# Patient Record
Sex: Male | Born: 1945
Health system: Southern US, Community
[De-identification: ages and names within clinical notes are randomized; demographics above are authoritative.]

## PROBLEM LIST (undated history)

## (undated) DIAGNOSIS — IMO0002 Reserved for concepts with insufficient information to code with codable children: Secondary | ICD-10-CM

## (undated) DIAGNOSIS — Z953 Presence of xenogenic heart valve: Secondary | ICD-10-CM

## (undated) DIAGNOSIS — I701 Atherosclerosis of renal artery: Secondary | ICD-10-CM

## (undated) DIAGNOSIS — I4891 Unspecified atrial fibrillation: Secondary | ICD-10-CM

## (undated) DIAGNOSIS — I219 Acute myocardial infarction, unspecified: Secondary | ICD-10-CM

## (undated) DIAGNOSIS — E119 Type 2 diabetes mellitus without complications: Secondary | ICD-10-CM

## (undated) DIAGNOSIS — I209 Angina pectoris, unspecified: Secondary | ICD-10-CM

## (undated) DIAGNOSIS — Z951 Presence of aortocoronary bypass graft: Secondary | ICD-10-CM

## (undated) DIAGNOSIS — R0600 Dyspnea, unspecified: Secondary | ICD-10-CM

## (undated) DIAGNOSIS — E039 Hypothyroidism, unspecified: Secondary | ICD-10-CM

## (undated) DIAGNOSIS — T8859XA Other complications of anesthesia, initial encounter: Secondary | ICD-10-CM

## (undated) DIAGNOSIS — I2510 Atherosclerotic heart disease of native coronary artery without angina pectoris: Principal | ICD-10-CM

## (undated) DIAGNOSIS — C140 Malignant neoplasm of pharynx, unspecified: Secondary | ICD-10-CM

## (undated) DIAGNOSIS — I509 Heart failure, unspecified: Secondary | ICD-10-CM

## (undated) DIAGNOSIS — Z950 Presence of cardiac pacemaker: Secondary | ICD-10-CM

## (undated) DIAGNOSIS — I10 Essential (primary) hypertension: Secondary | ICD-10-CM

## (undated) DIAGNOSIS — E669 Obesity, unspecified: Secondary | ICD-10-CM

## (undated) DIAGNOSIS — I9789 Other postprocedural complications and disorders of the circulatory system, not elsewhere classified: Secondary | ICD-10-CM

## (undated) DIAGNOSIS — Z95810 Presence of automatic (implantable) cardiac defibrillator: Secondary | ICD-10-CM

## (undated) DIAGNOSIS — E785 Hyperlipidemia, unspecified: Secondary | ICD-10-CM

## (undated) DIAGNOSIS — F419 Anxiety disorder, unspecified: Secondary | ICD-10-CM

## (undated) DIAGNOSIS — T4145XA Adverse effect of unspecified anesthetic, initial encounter: Secondary | ICD-10-CM

## (undated) DIAGNOSIS — I25810 Atherosclerosis of coronary artery bypass graft(s) without angina pectoris: Secondary | ICD-10-CM

## (undated) HISTORY — DX: Acute myocardial infarction, unspecified: I21.9

## (undated) HISTORY — PX: CORONARY ANGIOPLASTY WITH STENT PLACEMENT: SHX49

## (undated) HISTORY — PX: CARDIAC VALVE REPLACEMENT: SHX585

## (undated) HISTORY — DX: Malignant neoplasm of pharynx, unspecified: C14.0

## (undated) HISTORY — PX: CORONARY ANGIOPLASTY: SHX604

## (undated) HISTORY — DX: Presence of aortocoronary bypass graft: Z95.1

## (undated) HISTORY — DX: Atherosclerotic heart disease of native coronary artery without angina pectoris: I25.10

## (undated) HISTORY — DX: Hyperlipidemia, unspecified: E78.5

## (undated) HISTORY — DX: Other postprocedural complications and disorders of the circulatory system, not elsewhere classified: I97.89

## (undated) HISTORY — DX: Atherosclerosis of renal artery: I70.1

## (undated) HISTORY — DX: Essential (primary) hypertension: I10

## (undated) HISTORY — DX: Obesity, unspecified: E66.9

## (undated) HISTORY — DX: Unspecified atrial fibrillation: I48.91

## (undated) HISTORY — DX: Type 2 diabetes mellitus without complications: E11.9

## (undated) HISTORY — DX: Atherosclerosis of coronary artery bypass graft(s) without angina pectoris: I25.810

---

## 2005-10-02 HISTORY — PX: RENAL ARTERY STENT: SHX2321

## 2005-10-03 ENCOUNTER — Inpatient Hospital Stay (HOSPITAL_COMMUNITY): Admission: EM | Admit: 2005-10-03 | Discharge: 2005-10-07 | Payer: Self-pay | Admitting: Emergency Medicine

## 2005-10-09 ENCOUNTER — Inpatient Hospital Stay (HOSPITAL_COMMUNITY): Admission: RE | Admit: 2005-10-09 | Discharge: 2005-10-21 | Payer: Self-pay | Admitting: Cardiovascular Disease

## 2005-10-13 DIAGNOSIS — Z951 Presence of aortocoronary bypass graft: Secondary | ICD-10-CM

## 2005-10-13 HISTORY — DX: Presence of aortocoronary bypass graft: Z95.1

## 2005-10-13 HISTORY — PX: CORONARY ARTERY BYPASS GRAFT: SHX141

## 2005-10-15 DIAGNOSIS — I4891 Unspecified atrial fibrillation: Secondary | ICD-10-CM

## 2005-10-15 HISTORY — DX: Unspecified atrial fibrillation: I48.91

## 2005-10-19 HISTORY — PX: TRANSESOPHAGEAL ECHOCARDIOGRAM: SHX273

## 2005-11-13 ENCOUNTER — Encounter
Admission: RE | Admit: 2005-11-13 | Discharge: 2005-11-13 | Payer: Self-pay | Admitting: Thoracic Surgery (Cardiothoracic Vascular Surgery)

## 2007-10-09 HISTORY — PX: OTHER SURGICAL HISTORY: SHX169

## 2010-03-28 HISTORY — PX: OTHER SURGICAL HISTORY: SHX169

## 2013-01-22 DIAGNOSIS — E119 Type 2 diabetes mellitus without complications: Secondary | ICD-10-CM | POA: Diagnosis not present

## 2013-01-22 DIAGNOSIS — E785 Hyperlipidemia, unspecified: Secondary | ICD-10-CM | POA: Diagnosis not present

## 2013-01-22 DIAGNOSIS — I2510 Atherosclerotic heart disease of native coronary artery without angina pectoris: Secondary | ICD-10-CM | POA: Diagnosis not present

## 2013-01-22 DIAGNOSIS — I10 Essential (primary) hypertension: Secondary | ICD-10-CM | POA: Diagnosis not present

## 2013-01-22 DIAGNOSIS — Z79899 Other long term (current) drug therapy: Secondary | ICD-10-CM | POA: Diagnosis not present

## 2013-01-22 DIAGNOSIS — E782 Mixed hyperlipidemia: Secondary | ICD-10-CM | POA: Diagnosis not present

## 2013-01-31 DIAGNOSIS — Z6831 Body mass index (BMI) 31.0-31.9, adult: Secondary | ICD-10-CM | POA: Diagnosis not present

## 2013-01-31 DIAGNOSIS — Z23 Encounter for immunization: Secondary | ICD-10-CM | POA: Diagnosis not present

## 2013-01-31 DIAGNOSIS — IMO0001 Reserved for inherently not codable concepts without codable children: Secondary | ICD-10-CM | POA: Diagnosis not present

## 2013-01-31 DIAGNOSIS — Z125 Encounter for screening for malignant neoplasm of prostate: Secondary | ICD-10-CM | POA: Diagnosis not present

## 2013-02-19 DIAGNOSIS — IMO0001 Reserved for inherently not codable concepts without codable children: Secondary | ICD-10-CM | POA: Diagnosis not present

## 2013-02-25 DIAGNOSIS — E785 Hyperlipidemia, unspecified: Secondary | ICD-10-CM | POA: Diagnosis not present

## 2013-02-25 DIAGNOSIS — IMO0001 Reserved for inherently not codable concepts without codable children: Secondary | ICD-10-CM | POA: Diagnosis not present

## 2013-02-25 DIAGNOSIS — I10 Essential (primary) hypertension: Secondary | ICD-10-CM | POA: Diagnosis not present

## 2013-02-25 DIAGNOSIS — E669 Obesity, unspecified: Secondary | ICD-10-CM | POA: Diagnosis not present

## 2013-03-04 DIAGNOSIS — Z23 Encounter for immunization: Secondary | ICD-10-CM | POA: Diagnosis not present

## 2013-03-10 ENCOUNTER — Other Ambulatory Visit: Payer: Self-pay | Admitting: Cardiovascular Disease

## 2013-03-18 DIAGNOSIS — IMO0001 Reserved for inherently not codable concepts without codable children: Secondary | ICD-10-CM | POA: Diagnosis not present

## 2013-03-18 DIAGNOSIS — E785 Hyperlipidemia, unspecified: Secondary | ICD-10-CM | POA: Diagnosis not present

## 2013-03-18 DIAGNOSIS — I10 Essential (primary) hypertension: Secondary | ICD-10-CM | POA: Diagnosis not present

## 2013-06-24 DIAGNOSIS — H524 Presbyopia: Secondary | ICD-10-CM | POA: Diagnosis not present

## 2013-06-24 DIAGNOSIS — H5200 Hypermetropia, unspecified eye: Secondary | ICD-10-CM | POA: Diagnosis not present

## 2013-06-24 DIAGNOSIS — H179 Unspecified corneal scar and opacity: Secondary | ICD-10-CM | POA: Diagnosis not present

## 2013-06-24 DIAGNOSIS — H52229 Regular astigmatism, unspecified eye: Secondary | ICD-10-CM | POA: Diagnosis not present

## 2013-07-01 ENCOUNTER — Other Ambulatory Visit: Payer: Self-pay | Admitting: Cardiovascular Disease

## 2013-07-22 DIAGNOSIS — I2510 Atherosclerotic heart disease of native coronary artery without angina pectoris: Secondary | ICD-10-CM | POA: Diagnosis not present

## 2013-07-22 DIAGNOSIS — IMO0001 Reserved for inherently not codable concepts without codable children: Secondary | ICD-10-CM | POA: Diagnosis not present

## 2013-07-22 DIAGNOSIS — I10 Essential (primary) hypertension: Secondary | ICD-10-CM | POA: Diagnosis not present

## 2013-07-22 DIAGNOSIS — E785 Hyperlipidemia, unspecified: Secondary | ICD-10-CM | POA: Diagnosis not present

## 2013-08-04 DIAGNOSIS — Z23 Encounter for immunization: Secondary | ICD-10-CM | POA: Diagnosis not present

## 2013-08-04 DIAGNOSIS — E119 Type 2 diabetes mellitus without complications: Secondary | ICD-10-CM | POA: Diagnosis not present

## 2013-08-26 ENCOUNTER — Ambulatory Visit (INDEPENDENT_AMBULATORY_CARE_PROVIDER_SITE_OTHER): Payer: Medicare Other | Admitting: Cardiovascular Disease

## 2013-08-26 ENCOUNTER — Encounter: Payer: Self-pay | Admitting: Cardiovascular Disease

## 2013-08-26 VITALS — BP 141/82 | HR 67 | Ht 72.0 in | Wt 223.6 lb

## 2013-08-26 DIAGNOSIS — E782 Mixed hyperlipidemia: Secondary | ICD-10-CM | POA: Insufficient documentation

## 2013-08-26 DIAGNOSIS — I4891 Unspecified atrial fibrillation: Secondary | ICD-10-CM

## 2013-08-26 DIAGNOSIS — I2510 Atherosclerotic heart disease of native coronary artery without angina pectoris: Secondary | ICD-10-CM | POA: Diagnosis not present

## 2013-08-26 DIAGNOSIS — E119 Type 2 diabetes mellitus without complications: Secondary | ICD-10-CM

## 2013-08-26 DIAGNOSIS — I519 Heart disease, unspecified: Secondary | ICD-10-CM | POA: Diagnosis not present

## 2013-08-26 DIAGNOSIS — I701 Atherosclerosis of renal artery: Secondary | ICD-10-CM | POA: Diagnosis not present

## 2013-08-26 DIAGNOSIS — I10 Essential (primary) hypertension: Secondary | ICD-10-CM | POA: Diagnosis not present

## 2013-08-26 DIAGNOSIS — E785 Hyperlipidemia, unspecified: Secondary | ICD-10-CM

## 2013-08-26 DIAGNOSIS — E1159 Type 2 diabetes mellitus with other circulatory complications: Secondary | ICD-10-CM | POA: Insufficient documentation

## 2013-08-26 DIAGNOSIS — E669 Obesity, unspecified: Secondary | ICD-10-CM

## 2013-08-26 DIAGNOSIS — E66811 Obesity, class 1: Secondary | ICD-10-CM

## 2013-08-26 HISTORY — DX: Obesity, unspecified: E66.9

## 2013-08-26 HISTORY — DX: Atherosclerotic heart disease of native coronary artery without angina pectoris: I25.10

## 2013-08-26 HISTORY — DX: Hyperlipidemia, unspecified: E78.5

## 2013-08-26 HISTORY — DX: Obesity, class 1: E66.811

## 2013-08-26 HISTORY — DX: Essential (primary) hypertension: I10

## 2013-08-26 HISTORY — DX: Type 2 diabetes mellitus without complications: E11.9

## 2013-08-26 HISTORY — DX: Atherosclerosis of renal artery: I70.1

## 2013-08-26 MED ORDER — POTASSIUM CHLORIDE CRYS ER 20 MEQ PO TBCR: 20.0000 meq | EXTENDED_RELEASE_TABLET | Freq: Every day | ORAL | Status: DC

## 2013-08-26 MED ORDER — HYDROCHLOROTHIAZIDE 25 MG PO TABS: 25.0000 mg | ORAL_TABLET | Freq: Every day | ORAL | Status: DC

## 2013-08-26 MED ORDER — AMLODIPINE BESYLATE 10 MG PO TABS: 10.0000 mg | ORAL_TABLET | Freq: Every day | ORAL | Status: DC

## 2013-08-26 MED ORDER — CARVEDILOL 25 MG PO TABS: 25.0000 mg | ORAL_TABLET | Freq: Two times a day (BID) | ORAL | Status: DC

## 2013-08-26 MED ORDER — ROSUVASTATIN CALCIUM 10 MG PO TABS: 10.0000 mg | ORAL_TABLET | Freq: Every day | ORAL | Status: DC

## 2013-08-26 MED ORDER — LISINOPRIL 40 MG PO TABS: 40.0000 mg | ORAL_TABLET | Freq: Every day | ORAL | Status: DC

## 2013-08-26 NOTE — Patient Instructions (Signed)
Your physician recommends that you schedule a follow-up appointment in: 12 months.  

## 2013-08-26 NOTE — Progress Notes (Signed)
Patient ID: Christian Bowman, male   DOB: 18-Jun-1946, 66 y.o.   MRN: [id number]         Reason for office visit CAD p CABG follow up  Nearly eight years s/p CABG he has not had any new coronary events.All his risk factors are gradually being well addressed and he is happy with diabetes control now. He is seeing a specialist in [place], Dr. Theresa Duty. He has lost weight. He did not have satisfactory cholesterol reduction on simvastatin and niacin, but has excellent response and no side effects with Crestor.   Allergies  Allergen Reactions  . Xanax [Alprazolam]     Current Outpatient Prescriptions  Medication Sig Dispense Refill  . aspirin 325 MG tablet Take 325 mg by mouth daily.      [name] lisinopril (PRINIVIL,ZESTRIL) 40 MG tablet Take 1 tablet (40 mg total) by mouth daily.  90 tablet  3  . potassium chloride SA (KLOR-CON M20) 20 MEQ tablet Take 1 tablet (20 mEq total) by mouth daily.  30 tablet  11  . amLODipine (NORVASC) 10 MG tablet Take 1 tablet (10 mg total) by mouth daily.  90 tablet  3  . carvedilol (COREG) 25 MG tablet Take 1 tablet (25 mg total) by mouth 2 (two) times daily.  180 tablet  3  . hydrochlorothiazide (HYDRODIURIL) 25 MG tablet Take 1 tablet (25 mg total) by mouth daily.  90 tablet  3  . metFORMIN (GLUCOPHAGE) 1000 MG tablet Take 1,000 mg by mouth 2 (two) times daily.      . rosuvastatin (CRESTOR) 10 MG tablet Take 1 tablet (10 mg total) by mouth daily.  90 tablet  3  . TRADJENTA 5 MG TABS tablet Take 5 mg by mouth daily.       No current facility-administered medications for this visit.     Past Medical History  Diagnosis Date  . Palpitations   . Left renal artery stenosis 08/26/2013    6x12 Genesis stent 2007  . CAD s/p CABG 2007 08/26/2013  . Hyperlipidemia 08/26/2013  . Obesity (BMI 30.0-34.9) 08/26/2013  . HTN (hypertension) 08/26/2013  . Syncope due to orthostatic hypotension, remote history 08/26/2013  . Postoperative atrial fibrillation 08/26/2013    Past surgical history: CABG 2007 (LIMA to LAD, SVG-Ramus, Sequential SVG PDA-PLA)  Family history is not contributory  History   Social History  . Marital Status: Married    Spouse Name: N/A    Number of Children: N/A  . Years of Education: N/A   Occupational History  . Not on file.   Social History Main Topics  . Smoking status: Never Smoker   . Smokeless tobacco: Not on file  . Alcohol Use: No  . Drug Use: No  . Sexual Activity: Not on file   Other Topics Concern  . Not on file   Social History Narrative  . No narrative on file    Review of systems: The patient specifically denies any chest pain at rest or with exertion, dyspnea at rest or with exertion, orthopnea, paroxysmal nocturnal dyspnea, syncope, palpitations, focal neurological deficits, intermittent claudication, lower extremity edema, unexplained weight gain, cough, hemoptysis or wheezing.  The patient also denies abdominal pain, nausea, vomiting, dysphagia, diarrhea, constipation, polyuria, polydipsia, dysuria, hematuria, frequency, urgency, abnormal bleeding or bruising, fever, chills, unexpected weight changes, mood swings, change in skin or hair texture, change in voice quality, auditory or visual problems, allergic reactions or rashes, new musculoskeletal complaints other than usual "aches and pains".  PHYSICAL EXAM BP 152/88  Pulse 67  Ht 6' (1.829 m)  Wt 223 lb 9.6 oz (101.424 kg)  BMI 30.32 kg/m2 Recheck 141/82 mm hg General: Alert, oriented x3, no distress Head: no evidence of trauma, PERRL, EOMI, no exophtalmos or lid lag, no myxedema, no xanthelasma; normal ears, nose and oropharynx Neck: normal jugular venous pulsations and no hepatojugular reflux; brisk carotid pulses without delay and no carotid bruits Chest: clear to auscultation, no signs of consolidation by percussion or palpation, normal fremitus, symmetrical and full respiratory excursions Cardiovascular: normal position and quality of  the apical impulse, regular rhythm, normal first and second heart sounds, no murmurs, rubs or gallops Abdomen: no tenderness or distention, no masses by palpation, no abnormal pulsatility or arterial bruits, normal bowel sounds, no hepatosplenomegaly Extremities: no clubbing, cyanosis or edema; 2+ radial, ulnar and brachial pulses bilaterally; 2+ right femoral, posterior tibial and dorsalis pedis pulses; 2+ left femoral, posterior tibial and dorsalis pedis pulses; no subclavian or femoral bruits Neurological: grossly nonfocal   EKG: NSR   EKG: NSR  Lipid Panel TC 141, HDL 30, LDL68, TG 242 (A1c was 10% at the time)  BMET K 3.8, Creat 0.88   ASSESSMENT AND PLAN CAD s/p CABG 2007 Asymptomatic since surgery. Normal LV systolic function. Normal perfusion study 2009. Focus is on risk factor modification. His presentation was atypical, without angina. He presented with [facility] urgency, required renal artery stenting and was incidentally found to have left main coronary artery stenosis.  DM2 (diabetes mellitus, type 2) Last A1c was within the desirable range. Continued efforts at weight loss are strongly recommended.  HTN (hypertension) Fair control, BP today slightly higher than when usually checked.  Hyperlipidemia To be checked again by Dr. Renaee Munda in the near future  Left renal artery stenosis Last evaluated in 2011 with Duplex US, no evidence of restenosis. Recheck if BP stays high.  Obesity (BMI 30.0-34.9) Congratulated on success with weight loss so far and encouraged to continue his efforts.  Postoperative atrial fibrillation No recurrence since surgery   Patient Instructions  Your physician recommends that you schedule a follow-up appointment in: 12 months  Your physician recommends that you return for lab work.   Junious Silk, MD, [facility] [phone] office [phone] pager

## 2013-08-27 ENCOUNTER — Encounter: Payer: Self-pay | Admitting: Cardiovascular Disease

## 2013-09-04 DIAGNOSIS — I10 Essential (primary) hypertension: Secondary | ICD-10-CM | POA: Diagnosis not present

## 2013-09-04 DIAGNOSIS — E119 Type 2 diabetes mellitus without complications: Secondary | ICD-10-CM | POA: Diagnosis not present

## 2013-09-04 DIAGNOSIS — I2510 Atherosclerotic heart disease of native coronary artery without angina pectoris: Secondary | ICD-10-CM | POA: Diagnosis not present

## 2013-09-04 DIAGNOSIS — E785 Hyperlipidemia, unspecified: Secondary | ICD-10-CM | POA: Diagnosis not present

## 2013-09-19 ENCOUNTER — Telehealth: Payer: Self-pay | Admitting: Cardiovascular Disease

## 2013-09-19 NOTE — Telephone Encounter (Signed)
Pt still have not gotten his Lisinipril. Would you please call or fax this in today to [facility] in [place].

## 2013-09-19 NOTE — Telephone Encounter (Signed)
Call to pharmacy and confirmed all Rxs sent on 11.25.14 were received.  Lisinopril and a few others are ready for pick up for a few days.    Returned call and pt verified x 2 w/ [name].  Informed RN called pharmacy and refill ready for pick-up.  Informed there were others ready as well.  Verbalized understanding.  Stated she just called and they told her only one was ready and it wasn't lisinopril.  Will check w/ pharmacy.

## 2014-01-20 DIAGNOSIS — I10 Essential (primary) hypertension: Secondary | ICD-10-CM | POA: Diagnosis not present

## 2014-01-20 DIAGNOSIS — IMO0001 Reserved for inherently not codable concepts without codable children: Secondary | ICD-10-CM | POA: Diagnosis not present

## 2014-01-20 DIAGNOSIS — E785 Hyperlipidemia, unspecified: Secondary | ICD-10-CM | POA: Diagnosis not present

## 2014-01-20 DIAGNOSIS — I2510 Atherosclerotic heart disease of native coronary artery without angina pectoris: Secondary | ICD-10-CM | POA: Diagnosis not present

## 2014-06-01 DIAGNOSIS — I10 Essential (primary) hypertension: Secondary | ICD-10-CM | POA: Diagnosis not present

## 2014-06-01 DIAGNOSIS — D489 Neoplasm of uncertain behavior, unspecified: Secondary | ICD-10-CM | POA: Diagnosis not present

## 2014-06-01 DIAGNOSIS — E785 Hyperlipidemia, unspecified: Secondary | ICD-10-CM | POA: Diagnosis not present

## 2014-06-01 DIAGNOSIS — IMO0001 Reserved for inherently not codable concepts without codable children: Secondary | ICD-10-CM | POA: Diagnosis not present

## 2014-06-03 DIAGNOSIS — E049 Nontoxic goiter, unspecified: Secondary | ICD-10-CM | POA: Diagnosis not present

## 2014-06-11 ENCOUNTER — Telehealth: Payer: Self-pay | Admitting: Cardiovascular Disease

## 2014-06-15 NOTE — Telephone Encounter (Signed)
Closed enounter °

## 2014-06-17 DIAGNOSIS — R946 Abnormal results of thyroid function studies: Secondary | ICD-10-CM | POA: Diagnosis not present

## 2014-06-17 DIAGNOSIS — IMO0001 Reserved for inherently not codable concepts without codable children: Secondary | ICD-10-CM | POA: Diagnosis not present

## 2014-06-17 DIAGNOSIS — E785 Hyperlipidemia, unspecified: Secondary | ICD-10-CM | POA: Diagnosis not present

## 2014-06-30 DIAGNOSIS — H2510 Age-related nuclear cataract, unspecified eye: Secondary | ICD-10-CM | POA: Diagnosis not present

## 2014-07-02 DIAGNOSIS — R221 Localized swelling, mass and lump, neck: Secondary | ICD-10-CM | POA: Diagnosis not present

## 2014-07-06 DIAGNOSIS — E1165 Type 2 diabetes mellitus with hyperglycemia: Secondary | ICD-10-CM | POA: Diagnosis not present

## 2014-07-30 DIAGNOSIS — R221 Localized swelling, mass and lump, neck: Secondary | ICD-10-CM | POA: Diagnosis not present

## 2014-07-30 DIAGNOSIS — D4989 Neoplasm of unspecified behavior of other specified sites: Secondary | ICD-10-CM | POA: Diagnosis not present

## 2014-08-11 DIAGNOSIS — D360 Benign neoplasm of lymph nodes: Secondary | ICD-10-CM | POA: Diagnosis not present

## 2014-08-11 DIAGNOSIS — R221 Localized swelling, mass and lump, neck: Secondary | ICD-10-CM | POA: Diagnosis not present

## 2014-08-20 DIAGNOSIS — D360 Benign neoplasm of lymph nodes: Secondary | ICD-10-CM | POA: Diagnosis not present

## 2014-08-25 ENCOUNTER — Encounter: Payer: Self-pay | Admitting: *Deleted

## 2014-08-31 ENCOUNTER — Encounter: Payer: Self-pay | Admitting: Cardiovascular Disease

## 2014-08-31 ENCOUNTER — Ambulatory Visit (INDEPENDENT_AMBULATORY_CARE_PROVIDER_SITE_OTHER): Payer: Medicare Other | Admitting: Cardiovascular Disease

## 2014-08-31 VITALS — BP 132/88 | HR 80 | Resp 16 | Ht 72.0 in | Wt 224.2 lb

## 2014-08-31 DIAGNOSIS — E669 Obesity, unspecified: Secondary | ICD-10-CM | POA: Diagnosis not present

## 2014-08-31 DIAGNOSIS — E785 Hyperlipidemia, unspecified: Secondary | ICD-10-CM | POA: Diagnosis not present

## 2014-08-31 DIAGNOSIS — I150 Renovascular hypertension: Secondary | ICD-10-CM | POA: Diagnosis not present

## 2014-08-31 DIAGNOSIS — I2510 Atherosclerotic heart disease of native coronary artery without angina pectoris: Secondary | ICD-10-CM | POA: Diagnosis not present

## 2014-08-31 DIAGNOSIS — I9789 Other postprocedural complications and disorders of the circulatory system, not elsewhere classified: Secondary | ICD-10-CM | POA: Diagnosis not present

## 2014-08-31 DIAGNOSIS — I4891 Unspecified atrial fibrillation: Secondary | ICD-10-CM

## 2014-08-31 DIAGNOSIS — I701 Atherosclerosis of renal artery: Secondary | ICD-10-CM | POA: Diagnosis not present

## 2014-08-31 NOTE — Progress Notes (Signed)
Patient ID: Christian Bowman, male   DOB: [birth year], 68 y.o.   MRN: [id number]      Reason for office visit CAD, history of renal artery stenosis, hyperlipidemia, hypertension  Roughly 9 years following bypass surgery, he returns for routine follow-up and has not had any interval cardiac events. He had 4-vessel bypass in 2007 (LIMA to LAD, SVG to ramus intermedius, SVG to PDA-PLA). His presentation was atypical, without angina. He presented with [facility] urgency, required renal artery stenting and was incidentally found to have left main coronary artery stenosis. He has well compensated hypertension. He has diabetes mellitus type 2 that has had erratic control over the years. He is mildly obese and has struggled with weight loss. He has a typical lipid profile of insulin resistance with low HDL cholesterol, elevated triglycerides and a fairly normal LDL cholesterol level.  He is asymptomatic. Unfortunately he is again quite sedentary. At one point he was walking regularly but now has become "lazy". His eating habits are better than they were a few years ago but he periodically "falls off the wagon". Earlier this year his hemoglobin A1c had increased to 10.4%, most recently it was down to 7.6% in August. Roughly a year ago he weighed 10 pounds less than he does today and his hemoglobin A1c was 6.4%. At that time he was a little more enthusiastic about a healthy lifestyle.  In his youth he was an avid cyclist, often biking 100 miles every weekend, but since his bypass surgery he has had problems with bowels and is afraid to get back on his bicycle. I suggested a stationary bike.   Allergies  Allergen Reactions  . Xanax [Alprazolam]     Current Outpatient Prescriptions  Medication Sig Dispense Refill  . amLODipine (NORVASC) 10 MG tablet Take 1 tablet (10 mg total) by mouth daily. 90 tablet 3  . aspirin 325 MG tablet Take 325 mg by mouth daily.    . carvedilol (COREG) 25 MG tablet Take 1 tablet (25 mg  total) by mouth 2 (two) times daily. 180 tablet 3  . hydrochlorothiazide (HYDRODIURIL) 25 MG tablet Take 1 tablet (25 mg total) by mouth daily. 90 tablet 3  . JENTADUETO 2.01-999 MG TABS Take 1 tablet by mouth 2 (two) times daily.    [name] lisinopril (PRINIVIL,ZESTRIL) 40 MG tablet Take 1 tablet (40 mg total) by mouth daily. 90 tablet 3  . potassium chloride SA (KLOR-CON M20) 20 MEQ tablet Take 1 tablet (20 mEq total) by mouth daily. 30 tablet 11  . rosuvastatin (CRESTOR) 10 MG tablet Take 1 tablet (10 mg total) by mouth daily. 90 tablet 3   No current facility-administered medications for this visit.    Past Medical History  Diagnosis Date  . CAD (coronary artery disease) p CABG 2007 08/26/2013    LIMA to LAD, SVG-ramus, SVG to PDA - PLA  . Left renal artery stenosis 08/26/2013    Genesis 6x12 stent 2007  . Postoperative atrial fibrillation 08/26/2013  . HTN (hypertension) 08/26/2013  . Hyperlipidemia 08/26/2013  . DM2 (diabetes mellitus, type 2) 08/26/2013  . Obesity (BMI 30.0-34.9) 08/26/2013    Past Surgical History  Procedure Laterality Date  . Transesophageal echocardiogram  10/19/2005    NORMAL LV; MILD TO MODERATE AMOUNT OF SOFT ATHEROMATOUS PLAQUE OF THE THORACIC AORTA; THE LEFT ATRIUM IS MILDLY DILATED;LEFT ATRIAL APPENDAGE FUNCTION IS NORMAL;NO THROMBUS IDENTIFIED. SMALL PFO WITH RIGHT TO LEFT SHUNT  . Myocardical perfusion  10/09/2007    NORMAL PERFUSION IN  ALL REGIONS;NO EVIDENCE OF INDUCIBLE ISCHEMIA;POST STRESS EF% 66  . Renal doppler  03/28/2010    RIGHT RA-NORMAL;LEFT PROXIMAL RA AT STENT-PATENT WITH NO EVIDENCE OF SIGN DIAMETER REDUCTION. R & L KIDNEYS: EQUAL IN SIZE,SYMMETRICAL IN SHAPE.    No family history on file.  History   Social History  . Marital Status: Married    Spouse Name: N/A    Number of Children: N/A  . Years of Education: N/A   Occupational History  . Not on file.   Social History Main Topics  . Smoking status: Never Smoker   . Smokeless  tobacco: Not on file  . Alcohol Use: No  . Drug Use: No  . Sexual Activity: Not on file   Other Topics Concern  . Not on file   Social History Narrative    Review of systems: The patient specifically denies any chest pain at rest or with exertion, dyspnea at rest or with exertion, orthopnea, paroxysmal nocturnal dyspnea, syncope, palpitations, focal neurological deficits, intermittent claudication, lower extremity edema, unexplained weight gain, cough, hemoptysis or wheezing.  The patient also denies abdominal pain, nausea, vomiting, dysphagia, diarrhea, constipation, polyuria, polydipsia, dysuria, hematuria, frequency, urgency, abnormal bleeding or bruising, fever, chills, unexpected weight changes, mood swings, change in skin or hair texture, change in voice quality, auditory or visual problems, allergic reactions or rashes, new musculoskeletal complaints other than usual "aches and pains".   PHYSICAL EXAM BP 132/88 mmHg  Pulse 80  Resp 16  Ht 6' (1.829 m)  Wt 224 lb 3.2 oz (101.696 kg)  BMI 30.40 kg/m2  General: Alert, oriented x3, no distress Head: no evidence of trauma, PERRL, EOMI, no exophtalmos or lid lag, no myxedema, no xanthelasma; normal ears, nose and oropharynx Neck: normal jugular venous pulsations and no hepatojugular reflux; brisk carotid pulses without delay and no carotid bruits Chest: clear to auscultation, no signs of consolidation by percussion or palpation, normal fremitus, symmetrical and full respiratory excursions, healed sternotomy Cardiovascular: normal position and quality of the apical impulse, regular rhythm, normal first and second heart sounds, no murmurs, rubs or gallops Abdomen: no tenderness or distention, no masses by palpation, no abnormal pulsatility or arterial bruits, normal bowel sounds, no hepatosplenomegaly Extremities: no clubbing, cyanosis or edema; 2+ radial, ulnar and brachial pulses bilaterally; 2+ right femoral, posterior tibial and  dorsalis pedis pulses; 2+ left femoral, posterior tibial and dorsalis pedis pulses; no subclavian or femoral bruits Neurological: grossly nonfocal   EKG: NSR with frequent monomorphic PVCs  Lipid Panel  06/17/2014 total cholesterol 136, triglycerides 354, HDL 32, LDL 70  Potassium 3.9, glucose 311, creatinine 1.07, hemoglobin 13.5, normal LFTs  ASSESSMENT AND PLAN  CAD s/p CABG 2007 Asymptomatic since surgery. Normal LV systolic function. Normal perfusion study 2009. Focus is on risk factor modification. His presentation was atypical, without angina. He presented with [facility] urgency, required renal artery stenting and was incidentally found to have left main coronary artery stenosis.  DM2 (diabetes mellitus, type 2) Last A1c was aove the desirable range. Continued efforts at weight loss are strongly recommended. Again reviewed a healthy diet with carbohydrate restriction and a focus on lean protein and unsaturated fats.  HTN (hypertension) Fair control, BP today slightly higher than when usually checked.  Hyperlipidemia LDL cholesterol level is fair. He has elevated triglycerides and a low HDL cholesterol consistent with a metabolic syndrome. When his hemoglobin A1c was in the normal range his triglycerides were also much better. Focus on improved controlled diabetes before adding more  medication. His endocrinologist is retiring. He will probably go back to seeing Dr. Dorris Fetch in [place].  Left renal artery stenosis Last evaluated in 2011 with Duplex US, no evidence of restenosis. Recheck if BP stays high.  Obesity (BMI 30.0-34.9) Congratulated on success with weight loss so far and encouraged to continue his efforts.  Postoperative atrial fibrillation No recurrence since surgery  Orders Placed This Encounter  Procedures  . EKG 12-Lead   Meds ordered this encounter  Medications  . JENTADUETO 2.01-999 MG TABS    Sig: Take 1 tablet by mouth 2 (two) times daily.     Holli Humbles, MD, [place] [phone] office [phone] pager

## 2014-08-31 NOTE — Patient Instructions (Signed)
Dr. Croitoru recommends that you schedule a follow-up appointment in: ONE YEAR.  Your physician encouraged you to lose weight for better health.     

## 2014-10-03 ENCOUNTER — Other Ambulatory Visit: Payer: Self-pay | Admitting: Cardiovascular Disease

## 2014-10-05 NOTE — Telephone Encounter (Signed)
Rx refill sent to patient pharmacy   

## 2014-10-09 ENCOUNTER — Telehealth: Payer: Self-pay | Admitting: Cardiovascular Disease

## 2014-10-09 ENCOUNTER — Other Ambulatory Visit: Payer: Self-pay | Admitting: *Deleted

## 2014-10-09 MED ORDER — ROSUVASTATIN CALCIUM 10 MG PO TABS: 10.0000 mg | ORAL_TABLET | Freq: Every day | ORAL | Status: DC

## 2014-10-09 MED ORDER — HYDROCHLOROTHIAZIDE 25 MG PO TABS: 25.0000 mg | ORAL_TABLET | Freq: Every day | ORAL | Status: DC

## 2014-10-09 MED ORDER — CARVEDILOL 25 MG PO TABS: 25.0000 mg | ORAL_TABLET | Freq: Two times a day (BID) | ORAL | Status: DC

## 2014-10-09 MED ORDER — AMLODIPINE BESYLATE 10 MG PO TABS: 10.0000 mg | ORAL_TABLET | Freq: Every day | ORAL | Status: DC

## 2014-10-09 MED ORDER — LISINOPRIL 40 MG PO TABS: 40.0000 mg | ORAL_TABLET | Freq: Every day | ORAL | Status: DC

## 2014-10-09 NOTE — Telephone Encounter (Signed)
Resolved through surescripts request.

## 2014-10-09 NOTE — Telephone Encounter (Signed)
RX sent to patient's pharmacy 10/09/14

## 2014-10-09 NOTE — Telephone Encounter (Signed)
Pt need all his medicine refills called to [facility] in [place]. She says he does not get his diabetes medicine from us,she did not know their phone number.

## 2014-12-01 DIAGNOSIS — E663 Overweight: Secondary | ICD-10-CM | POA: Diagnosis not present

## 2014-12-01 DIAGNOSIS — Z6829 Body mass index (BMI) 29.0-29.9, adult: Secondary | ICD-10-CM | POA: Diagnosis not present

## 2014-12-01 DIAGNOSIS — K219 Gastro-esophageal reflux disease without esophagitis: Secondary | ICD-10-CM | POA: Diagnosis not present

## 2014-12-01 DIAGNOSIS — R0789 Other chest pain: Secondary | ICD-10-CM | POA: Diagnosis not present

## 2014-12-08 ENCOUNTER — Telehealth: Payer: Self-pay | Admitting: Cardiovascular Disease

## 2014-12-08 NOTE — Telephone Encounter (Signed)
As long as not symptomatic, would not change meds

## 2014-12-08 NOTE — Telephone Encounter (Signed)
[name] is calling because Christian Bowman is hving some problems , his bp was 116/67 then went to 118/92 and then went to 93/74 and all this was in a 30 minute time frame. Complains of his leg is trembling . Please call   Thanks

## 2014-12-08 NOTE — Telephone Encounter (Signed)
Spoke to patient, he is concerned that his BP has been 110s/60s as it has historically been higher. 93/74 reading probably outlier. Regardless, we discussed BP ranges at length. Inquired about syncope, dizziness, etc. He is not having any symptoms. Further, he has recently increased activity and is doing more exercise & lost weight. Down a few lbs since seen here November.  Advised that BP findings are fine, HR ranges were normal (60-80 & 90s w/ activity).  He had wondered if needed to be seen by Dr. Loletha Grayer. I advised no indication if no problems & if he did develop symptoms w/ low BP we could reassess need for appt and/or med adjustment. He voiced understanding & agreement w this plan.  Pt's only remaining concern was twitching, trembling in right leg that happened a couple times while walking recently. No pain or numbness associated but did have some brief localized weakness. Advised this would best be handled by PCP who could refer to ortho or neuro (or vascular) based on their findings. He voiced understanding & will plan appt w/ PCP.

## 2014-12-17 DIAGNOSIS — I959 Hypotension, unspecified: Secondary | ICD-10-CM | POA: Diagnosis not present

## 2014-12-17 DIAGNOSIS — R002 Palpitations: Secondary | ICD-10-CM | POA: Diagnosis not present

## 2014-12-17 DIAGNOSIS — Z6828 Body mass index (BMI) 28.0-28.9, adult: Secondary | ICD-10-CM | POA: Diagnosis not present

## 2014-12-17 DIAGNOSIS — R2681 Unsteadiness on feet: Secondary | ICD-10-CM | POA: Diagnosis not present

## 2015-02-10 DIAGNOSIS — J449 Chronic obstructive pulmonary disease, unspecified: Secondary | ICD-10-CM | POA: Diagnosis not present

## 2015-02-10 DIAGNOSIS — E1165 Type 2 diabetes mellitus with hyperglycemia: Secondary | ICD-10-CM | POA: Diagnosis not present

## 2015-03-08 DIAGNOSIS — E785 Hyperlipidemia, unspecified: Secondary | ICD-10-CM | POA: Diagnosis not present

## 2015-03-08 DIAGNOSIS — E1159 Type 2 diabetes mellitus with other circulatory complications: Secondary | ICD-10-CM | POA: Diagnosis not present

## 2015-03-08 DIAGNOSIS — I10 Essential (primary) hypertension: Secondary | ICD-10-CM | POA: Diagnosis not present

## 2015-03-08 DIAGNOSIS — E6609 Other obesity due to excess calories: Secondary | ICD-10-CM | POA: Diagnosis not present

## 2015-03-15 DIAGNOSIS — I10 Essential (primary) hypertension: Secondary | ICD-10-CM | POA: Diagnosis not present

## 2015-03-15 DIAGNOSIS — E1165 Type 2 diabetes mellitus with hyperglycemia: Secondary | ICD-10-CM | POA: Diagnosis not present

## 2015-03-15 DIAGNOSIS — E785 Hyperlipidemia, unspecified: Secondary | ICD-10-CM | POA: Diagnosis not present

## 2015-03-15 DIAGNOSIS — E6609 Other obesity due to excess calories: Secondary | ICD-10-CM | POA: Diagnosis not present

## 2015-04-26 DIAGNOSIS — E1165 Type 2 diabetes mellitus with hyperglycemia: Secondary | ICD-10-CM | POA: Diagnosis not present

## 2015-04-26 DIAGNOSIS — I10 Essential (primary) hypertension: Secondary | ICD-10-CM | POA: Diagnosis not present

## 2015-04-26 DIAGNOSIS — E782 Mixed hyperlipidemia: Secondary | ICD-10-CM | POA: Diagnosis not present

## 2015-05-11 DIAGNOSIS — I10 Essential (primary) hypertension: Secondary | ICD-10-CM | POA: Diagnosis not present

## 2015-05-11 DIAGNOSIS — E785 Hyperlipidemia, unspecified: Secondary | ICD-10-CM | POA: Diagnosis not present

## 2015-05-11 DIAGNOSIS — E1165 Type 2 diabetes mellitus with hyperglycemia: Secondary | ICD-10-CM | POA: Diagnosis not present

## 2015-05-11 DIAGNOSIS — E6609 Other obesity due to excess calories: Secondary | ICD-10-CM | POA: Diagnosis not present

## 2015-06-21 DIAGNOSIS — Z713 Dietary counseling and surveillance: Secondary | ICD-10-CM | POA: Diagnosis not present

## 2015-06-21 DIAGNOSIS — E785 Hyperlipidemia, unspecified: Secondary | ICD-10-CM | POA: Diagnosis present

## 2015-06-21 DIAGNOSIS — N2889 Other specified disorders of kidney and ureter: Secondary | ICD-10-CM | POA: Diagnosis present

## 2015-06-21 DIAGNOSIS — I340 Nonrheumatic mitral (valve) insufficiency: Secondary | ICD-10-CM | POA: Diagnosis present

## 2015-06-21 DIAGNOSIS — J90 Pleural effusion, not elsewhere classified: Secondary | ICD-10-CM | POA: Diagnosis not present

## 2015-06-21 DIAGNOSIS — R093 Abnormal sputum: Secondary | ICD-10-CM | POA: Diagnosis not present

## 2015-06-21 DIAGNOSIS — E1165 Type 2 diabetes mellitus with hyperglycemia: Secondary | ICD-10-CM | POA: Diagnosis not present

## 2015-06-21 DIAGNOSIS — Z9981 Dependence on supplemental oxygen: Secondary | ICD-10-CM | POA: Diagnosis not present

## 2015-06-21 DIAGNOSIS — Z888 Allergy status to other drugs, medicaments and biological substances status: Secondary | ICD-10-CM | POA: Diagnosis not present

## 2015-06-21 DIAGNOSIS — Z7982 Long term (current) use of aspirin: Secondary | ICD-10-CM | POA: Diagnosis not present

## 2015-06-21 DIAGNOSIS — R0602 Shortness of breath: Secondary | ICD-10-CM | POA: Diagnosis not present

## 2015-06-21 DIAGNOSIS — J811 Chronic pulmonary edema: Secondary | ICD-10-CM | POA: Diagnosis not present

## 2015-06-21 DIAGNOSIS — I200 Unstable angina: Secondary | ICD-10-CM | POA: Diagnosis not present

## 2015-06-21 DIAGNOSIS — I2510 Atherosclerotic heart disease of native coronary artery without angina pectoris: Secondary | ICD-10-CM | POA: Diagnosis not present

## 2015-06-21 DIAGNOSIS — I5021 Acute systolic (congestive) heart failure: Secondary | ICD-10-CM | POA: Diagnosis not present

## 2015-06-21 DIAGNOSIS — J181 Lobar pneumonia, unspecified organism: Secondary | ICD-10-CM | POA: Diagnosis not present

## 2015-06-21 DIAGNOSIS — J209 Acute bronchitis, unspecified: Secondary | ICD-10-CM | POA: Diagnosis present

## 2015-06-21 DIAGNOSIS — R0682 Tachypnea, not elsewhere classified: Secondary | ICD-10-CM | POA: Diagnosis not present

## 2015-06-21 DIAGNOSIS — R0600 Dyspnea, unspecified: Secondary | ICD-10-CM | POA: Diagnosis not present

## 2015-06-21 DIAGNOSIS — I10 Essential (primary) hypertension: Secondary | ICD-10-CM | POA: Diagnosis not present

## 2015-06-21 DIAGNOSIS — Z79899 Other long term (current) drug therapy: Secondary | ICD-10-CM | POA: Diagnosis not present

## 2015-06-21 DIAGNOSIS — Z951 Presence of aortocoronary bypass graft: Secondary | ICD-10-CM | POA: Diagnosis not present

## 2015-06-21 DIAGNOSIS — Z9689 Presence of other specified functional implants: Secondary | ICD-10-CM | POA: Diagnosis present

## 2015-06-21 DIAGNOSIS — E876 Hypokalemia: Secondary | ICD-10-CM | POA: Diagnosis not present

## 2015-06-21 DIAGNOSIS — I5023 Acute on chronic systolic (congestive) heart failure: Secondary | ICD-10-CM | POA: Diagnosis not present

## 2015-06-21 DIAGNOSIS — N2881 Hypertrophy of kidney: Secondary | ICD-10-CM | POA: Diagnosis not present

## 2015-06-21 DIAGNOSIS — I214 Non-ST elevation (NSTEMI) myocardial infarction: Secondary | ICD-10-CM | POA: Diagnosis not present

## 2015-06-21 DIAGNOSIS — I428 Other cardiomyopathies: Secondary | ICD-10-CM | POA: Diagnosis not present

## 2015-06-21 DIAGNOSIS — J810 Acute pulmonary edema: Secondary | ICD-10-CM | POA: Diagnosis not present

## 2015-06-21 DIAGNOSIS — R000 Tachycardia, unspecified: Secondary | ICD-10-CM | POA: Diagnosis not present

## 2015-06-21 DIAGNOSIS — J9601 Acute respiratory failure with hypoxia: Secondary | ICD-10-CM | POA: Diagnosis present

## 2015-06-21 DIAGNOSIS — K219 Gastro-esophageal reflux disease without esophagitis: Secondary | ICD-10-CM | POA: Diagnosis present

## 2015-06-21 DIAGNOSIS — I5020 Unspecified systolic (congestive) heart failure: Secondary | ICD-10-CM | POA: Diagnosis not present

## 2015-06-21 DIAGNOSIS — R05 Cough: Secondary | ICD-10-CM | POA: Diagnosis not present

## 2015-06-21 DIAGNOSIS — I509 Heart failure, unspecified: Secondary | ICD-10-CM | POA: Diagnosis not present

## 2015-06-21 DIAGNOSIS — I25810 Atherosclerosis of coronary artery bypass graft(s) without angina pectoris: Secondary | ICD-10-CM | POA: Diagnosis not present

## 2015-06-21 DIAGNOSIS — E780 Pure hypercholesterolemia: Secondary | ICD-10-CM | POA: Diagnosis present

## 2015-07-02 DIAGNOSIS — Z1389 Encounter for screening for other disorder: Secondary | ICD-10-CM | POA: Diagnosis not present

## 2015-07-02 DIAGNOSIS — D72829 Elevated white blood cell count, unspecified: Secondary | ICD-10-CM | POA: Diagnosis not present

## 2015-07-02 DIAGNOSIS — E119 Type 2 diabetes mellitus without complications: Secondary | ICD-10-CM | POA: Diagnosis not present

## 2015-07-02 DIAGNOSIS — E876 Hypokalemia: Secondary | ICD-10-CM | POA: Diagnosis not present

## 2015-07-02 DIAGNOSIS — Z23 Encounter for immunization: Secondary | ICD-10-CM | POA: Diagnosis not present

## 2015-07-02 DIAGNOSIS — Z6827 Body mass index (BMI) 27.0-27.9, adult: Secondary | ICD-10-CM | POA: Diagnosis not present

## 2015-07-02 DIAGNOSIS — I2510 Atherosclerotic heart disease of native coronary artery without angina pectoris: Secondary | ICD-10-CM | POA: Diagnosis not present

## 2015-07-05 ENCOUNTER — Telehealth: Payer: Self-pay | Admitting: Cardiovascular Disease

## 2015-07-05 ENCOUNTER — Other Ambulatory Visit (HOSPITAL_COMMUNITY): Payer: Self-pay | Admitting: Family Medicine

## 2015-07-05 DIAGNOSIS — N2889 Other specified disorders of kidney and ureter: Secondary | ICD-10-CM

## 2015-07-05 NOTE — Telephone Encounter (Signed)
Christian Bowman is calling because she wants to know can they set up therapy instead of waiting until [name] appt . He just had a heart attack on 06/21/15. Please call .[name]  Thanks

## 2015-07-05 NOTE — Telephone Encounter (Signed)
Spoke with pt wife, he had his MI at [facility] in [place]. His follow up appt is 07-28-15 with dr croitoru, aware he would need to see a PA or NP for appt needed sooner than the 26th. Release of information given to medical records to get his records for dr c to review. Dr croitoru can review records and decide if he will need to see the pt prior to starting cardiac rehab. They will call back if they want to be seen by PA or NP prior to 10-26.

## 2015-07-21 ENCOUNTER — Ambulatory Visit (HOSPITAL_COMMUNITY)
Admission: RE | Admit: 2015-07-21 | Discharge: 2015-07-21 | Disposition: A | Discharge status: Home / Self Care | Payer: Medicare Other | Source: Ambulatory Visit | Attending: Family Medicine | Admitting: Family Medicine

## 2015-07-21 DIAGNOSIS — N2889 Other specified disorders of kidney and ureter: Secondary | ICD-10-CM

## 2015-07-21 MED ORDER — SODIUM CHLORIDE 0.9 % IJ SOLN
INTRAMUSCULAR | Status: AC
  Filled 2015-07-21: qty 15

## 2015-07-21 MED ORDER — SODIUM CHLORIDE 0.9 % IV SOLN
INTRAVENOUS | Status: AC
  Filled 2015-07-21: qty 100

## 2015-07-22 ENCOUNTER — Telehealth: Payer: Self-pay | Admitting: Cardiovascular Disease

## 2015-07-22 MED ORDER — CLOPIDOGREL BISULFATE 75 MG PO TABS: 75.0000 mg | ORAL_TABLET | Freq: Every day | ORAL | Status: DC

## 2015-07-22 MED ORDER — FUROSEMIDE 40 MG PO TABS: 40.0000 mg | ORAL_TABLET | Freq: Every day | ORAL | Status: DC

## 2015-07-22 NOTE — Telephone Encounter (Signed)
Pt's wife called in to request 5 day supply of medication he was prescribed at discharge from [facility] in [place], [place]. She could not remember medication. Pt has f/u visit w/ Dr. Sallyanne Kuster next week - she just wanted enough medication to continue. Found faxed request from [employer] r/t this medication - 1 request for 40mg  lasix once daily, 1 request for clopidogrel 75mg  daily. Renewed these and submitted to [facility].  Called pt's wife back and gave communication that these were sent. She verb'd understanding and thanks for the call.

## 2015-07-22 NOTE — Telephone Encounter (Signed)
Pt's wife called in stating that the pt just had a MI recently and was given Plavix and another drug in the hospital. She does not know what the other medication is but he is about to run out the the pharmacy has faxed over a request a couple of times. Please f/u with this pt  Thanks

## 2015-07-23 ENCOUNTER — Ambulatory Visit (HOSPITAL_COMMUNITY)
Admission: RE | Admit: 2015-07-23 | Discharge: 2015-07-23 | Disposition: A | Discharge status: Home / Self Care | Payer: Medicare Other | Source: Ambulatory Visit | Attending: Family Medicine | Admitting: Family Medicine

## 2015-07-23 DIAGNOSIS — N281 Cyst of kidney, acquired: Secondary | ICD-10-CM | POA: Diagnosis not present

## 2015-07-23 DIAGNOSIS — N2889 Other specified disorders of kidney and ureter: Secondary | ICD-10-CM | POA: Insufficient documentation

## 2015-07-23 LAB — POCT I-STAT CREATININE: Creatinine, Ser: 1.3 mg/dL — ABNORMAL HIGH (ref 0.61–1.24)

## 2015-07-23 MED ORDER — GADOBENATE DIMEGLUMINE 529 MG/ML IV SOLN
18.0000 mL | Freq: Once | INTRAVENOUS | Status: AC | PRN
  Administered 2015-07-23: 18 mL via INTRAVENOUS

## 2015-07-27 ENCOUNTER — Telehealth: Payer: Self-pay | Admitting: Cardiovascular Disease

## 2015-07-27 NOTE — Telephone Encounter (Signed)
Received records from [facility] for appointment on 07/28/15 with Dr Sallyanne Kuster.  Records given to [facility] for Dr Croitoru's schedule on 07/28/15. lp

## 2015-07-28 ENCOUNTER — Ambulatory Visit (INDEPENDENT_AMBULATORY_CARE_PROVIDER_SITE_OTHER): Payer: Medicare Other | Admitting: Cardiovascular Disease

## 2015-07-28 ENCOUNTER — Encounter: Payer: Self-pay | Admitting: Cardiovascular Disease

## 2015-07-28 VITALS — BP 134/86 | HR 77 | Resp 16 | Ht 74.0 in | Wt 204.2 lb

## 2015-07-28 DIAGNOSIS — E785 Hyperlipidemia, unspecified: Secondary | ICD-10-CM

## 2015-07-28 DIAGNOSIS — I2510 Atherosclerotic heart disease of native coronary artery without angina pectoris: Secondary | ICD-10-CM

## 2015-07-28 DIAGNOSIS — I25118 Atherosclerotic heart disease of native coronary artery with other forms of angina pectoris: Secondary | ICD-10-CM | POA: Diagnosis not present

## 2015-07-28 DIAGNOSIS — I5022 Chronic systolic (congestive) heart failure: Secondary | ICD-10-CM | POA: Insufficient documentation

## 2015-07-28 DIAGNOSIS — I340 Nonrheumatic mitral (valve) insufficiency: Secondary | ICD-10-CM | POA: Insufficient documentation

## 2015-07-28 DIAGNOSIS — I150 Renovascular hypertension: Secondary | ICD-10-CM | POA: Diagnosis not present

## 2015-07-28 DIAGNOSIS — E1159 Type 2 diabetes mellitus with other circulatory complications: Secondary | ICD-10-CM

## 2015-07-28 DIAGNOSIS — E669 Obesity, unspecified: Secondary | ICD-10-CM

## 2015-07-28 DIAGNOSIS — I701 Atherosclerosis of renal artery: Secondary | ICD-10-CM

## 2015-07-28 DIAGNOSIS — I5042 Chronic combined systolic (congestive) and diastolic (congestive) heart failure: Secondary | ICD-10-CM

## 2015-07-28 MED ORDER — AMLODIPINE BESYLATE 2.5 MG PO TABS: 2.5000 mg | ORAL_TABLET | Freq: Every day | ORAL | Status: DC

## 2015-07-28 MED ORDER — CARVEDILOL 25 MG PO TABS: 25.0000 mg | ORAL_TABLET | Freq: Two times a day (BID) | ORAL | Status: DC

## 2015-07-28 MED ORDER — CLOPIDOGREL BISULFATE 75 MG PO TABS: 75.0000 mg | ORAL_TABLET | Freq: Every day | ORAL | Status: DC

## 2015-07-28 MED ORDER — POTASSIUM CHLORIDE CRYS ER 10 MEQ PO TBCR: 10.0000 meq | EXTENDED_RELEASE_TABLET | Freq: Two times a day (BID) | ORAL | Status: DC

## 2015-07-28 MED ORDER — FUROSEMIDE 40 MG PO TABS: 40.0000 mg | ORAL_TABLET | Freq: Every day | ORAL | Status: DC

## 2015-07-28 MED ORDER — ROSUVASTATIN CALCIUM 10 MG PO TABS: 10.0000 mg | ORAL_TABLET | Freq: Every day | ORAL | Status: DC

## 2015-07-28 NOTE — Patient Instructions (Signed)
Your physician has recommended you make the following change in your medication:    DECREASE AMLODIPINE TO 2.5MG  DAILY CONTINUE CARVEDILOL 25MG  TWICE DAILY CHANGE POTASSIUM TO 10MEQ STRENGTH AND TAKE TWICE DAILY  Dr. Sallyanne Kuster recommends that you schedule a follow-up appointment in: 6-8 WEEKS

## 2015-07-28 NOTE — Progress Notes (Signed)
Patient ID: Christian Bowman, male   DOB: 1946/06/26, 69 y.o.   MRN: [id number]     Cardiology Office Note   Date:  07/28/2015   ID:  Christian Bowman, DOB 1946-09-25, MRN [id number]  PCP:  Purvis Kilts, MD  Cardiologist:   Sanda Klein, MD   Chief Complaint  Patient presents with  . Annual Exam    patient had MI last month while in [place], [place] went to [facility].      History of Present Illness: Christian Bowman is a 69 y.o. male who presents for  Recent episode of new onset heart failure.   He was in [place] when he experienced acute onset orthopnea and presented with pulmonary edema that responded fairly promptly to diuretics and BiPAP. His cardiac troponin T was only marginally elevated (twice the upper limit of normal), although the CK-MB was roughly five times the upper limit of normal. Echocardiography demonstrated left ventricular ejection fraction of 35% and moderate to severe mitral insufficiency. There was inferolateral akinesis. The left atrium was moderately enlarged. Diastolic dysfunction was described as "grade 1". Frequent PVCs were noted on telemetry. Coronary angiography was performed. There was total occlusion of native coronary arteries. The saphenous vein graft bypass to the OM was occluded. The saphenous vein graft bypass to the right coronary artery had a 90% stenosis which was treated with a drug-eluting stent (science alpine 3.25 x 15 mm placed on September 23 ) and the LIMA to the LAD was widely patent. Renal artery Dopplers did not show evidence of stenosis but there was an ill-defined mass in the left kidney measuring 2 x 3 cm, shown by MRI to be a cyst. Both kidneys were normal in size. Creatinine was in normal range. Glucose was transiently elevated.  He feels markedly improved. He has NYHA functional class II dyspnea. He denies angina pectoris. He denies syncope, dizziness or leg weakness. Several months before he presented with heart  failure, he had lost roughly 20 pounds in weight. and had some intermittent episodes of dizziness and "legs giving out". His amlodipine and carvedilol doses were cut in half by his primary care provider,with subsequent resolution of his spells.  He has had poorly defined balance problems for a while.  He does not have lower extremity edema. His weight is roughly 7 pounds up compared to last week when he came for an MRI, but it is hard to say if the scales are comparable.  He is now almost 10 years status post CABG (Dr. Roxy Manns, January 2007,  LIMA to LAD, SVG to ramus, SVG to PDA-PLA ) and his prior presentation was atypical , with dyspnea rather than angina.  He had postoperative atrial fibrillation and underwent TEE cardioversion. He did not have mitral insufficiency at that time.Coronary disease was identified incidentally when he presented for renal artery stenting and was found to have left main coronary artery stenosis. He also has type 2 diabetes mellitus and dyslipidemia with low HDL and elevated triglycerides. Historically, diabetes control has been erratic.  Past Medical History  Diagnosis Date  . CAD (coronary artery disease) p CABG 2007 08/26/2013    LIMA to LAD, SVG-ramus, SVG to PDA - PLA  . Left renal artery stenosis ([place]) 08/26/2013    Genesis 6x12 stent 2007  . Postoperative atrial fibrillation ([place]) 08/26/2013  . HTN (hypertension) 08/26/2013  . Hyperlipidemia 08/26/2013  . DM2 (diabetes mellitus, type 2) ([place]) 08/26/2013  . Obesity (BMI 30.0-34.9) 08/26/2013    Past  Surgical History  Procedure Laterality Date  . Transesophageal echocardiogram  10/19/2005    NORMAL LV; MILD TO MODERATE AMOUNT OF SOFT ATHEROMATOUS PLAQUE OF THE THORACIC AORTA; THE LEFT ATRIUM IS MILDLY DILATED;LEFT ATRIAL APPENDAGE FUNCTION IS NORMAL;NO THROMBUS IDENTIFIED. SMALL PFO WITH RIGHT TO LEFT SHUNT  . Myocardical perfusion  10/09/2007    NORMAL PERFUSION IN ALL REGIONS;NO EVIDENCE OF INDUCIBLE ISCHEMIA;POST  STRESS EF% 66  . Renal doppler  03/28/2010    RIGHT RA-NORMAL;LEFT PROXIMAL RA AT STENT-PATENT WITH NO EVIDENCE OF SIGN DIAMETER REDUCTION. R & L KIDNEYS: EQUAL IN SIZE,SYMMETRICAL IN SHAPE.     Current Outpatient Prescriptions  Medication Sig Dispense Refill  . aspirin 81 MG tablet Take 81 mg by mouth daily.    . carvedilol (COREG) 25 MG tablet Take 1 tablet (25 mg total) by mouth 2 (two) times daily. 180 tablet 3  . clopidogrel (PLAVIX) 75 MG tablet Take 1 tablet (75 mg total) by mouth daily. 5 tablet 0  . furosemide (LASIX) 40 MG tablet Take 1 tablet (40 mg total) by mouth daily. 5 tablet 0  . JENTADUETO 2.01-999 MG TABS Take 1 tablet by mouth 2 (two) times daily.    [name] lisinopril (PRINIVIL,ZESTRIL) 40 MG tablet Take 1 tablet (40 mg total) by mouth daily. 90 tablet 3  . potassium chloride (K-DUR,KLOR-CON) 10 MEQ tablet Take 1 tablet (10 mEq total) by mouth 2 (two) times daily. 180 tablet 3  . rosuvastatin (CRESTOR) 10 MG tablet Take 1 tablet (10 mg total) by mouth daily. 90 tablet 3  . amLODipine (NORVASC) 2.5 MG tablet Take 1 tablet (2.5 mg total) by mouth daily. 180 tablet 3   No current facility-administered medications for this visit.    Allergies:   Xanax    Social History:  The patient  reports that he has never smoked. He does not have any smokeless tobacco history on file. He reports that he does not drink alcohol or use illicit drugs.    ROS:  Please see the history of present illness.    Otherwise, review of systems positive for none.   All other systems are reviewed and negative.    PHYSICAL EXAM: VS:  BP 134/86 mmHg  Pulse 77  Ht 6\' 2"  (1.88 m)  Wt 204 lb 3.2 oz (92.625 kg)  BMI 26.21 kg/m2 , BMI Body mass index is 26.21 kg/(m^2).  General: Alert, oriented x3, no distress Head: no evidence of trauma, PERRL, EOMI, no exophtalmos or lid lag, no myxedema, no xanthelasma; normal ears, nose and oropharynx Neck: normal jugular venous pulsations and no hepatojugular  reflux; brisk carotid pulses without delay and no carotid bruits Chest: clear to auscultation, no signs of consolidation by percussion or palpation, normal fremitus, symmetrical and full respiratory excursions Cardiovascular: normal position and quality of the apical impulse, regular rhythm, normal first and second heart sounds,  Grade 2/6 holosystolic murmur radiating towards the axilla, no diastolic murmurs, rubs or gallops Abdomen: no tenderness or distention, no masses by palpation, no abnormal pulsatility or arterial bruits, normal bowel sounds, no hepatosplenomegaly Extremities: no clubbing, cyanosis or edema; 2+ radial, ulnar and brachial pulses bilaterally; 2+ right femoral, posterior tibial and dorsalis pedis pulses; 2+ left femoral, posterior tibial and dorsalis pedis pulses; no subclavian or femoral bruits Neurological: grossly nonfocal Psych: euthymic mood, full affect   EKG:  EKG is ordered today. The ekg ordered today demonstrates  Sinus rhythm, right bundle branch block, left anterior fascicular block , fairly frequent PVCs. The PR  interval measures 170 ms, QRS 148 ms, QTC 497 ms  intraventricular conduction abnormalities are new when compared to previous electrocardiogram from November 2015,  Although the PVCs been previously recognized.  Recent Labs: 07/23/2015: Creatinine, Ser 1.30*    Lipid Panel No results found for: CHOL, TRIG, HDL, CHOLHDL, VLDL, LDLCALC, LDLDIRECT    Wt Readings from Last 3 Encounters:  07/28/15 204 lb 3.2 oz (92.625 kg)  07/21/15 197 lb (89.359 kg)  08/31/14 224 lb 3.2 oz (101.696 kg)      ASSESSMENT AND PLAN:  1.  Chronic systolic and diastolic heart failure , with recent acute exacerbation. He has moderately depressed left ventricular systolic function with extensive inferolateral akinesis consistent with infarction. LVEF was estimated at 35% by echocardiography. Today he appears euvolemic. We spent a long time discussing sodium restriction,  signs and symptoms of heart failure exacerbation, the importance of daily weight monitoring and diuretic dose adjustment. He appears to have NYHA functional class II. His weight has increased 7 pounds in just one week, but this may be related to differences in scales rather than true weight gain.  I have recommended that he increase the dose of carvedilol back to 25 mg twice daily. To avoid the symptoms of hypotension we'll reduce his amlodipine to 2.5 mg daily and if necessary stop it altogether. He is already on maximum usual dose of lisinopril. He requires a relatively low dose of loop diuretic. Plan to reevaluate left ventricular systolic function in about 3 months. We may have  To discuss ICD.  2.  CAD status post CABG 2007, status post drug-eluting stent to SVG-RCA September  2016  there is a very big discrepancy between the marginal increase in cardiac enzymes and the severe reduction in left ventricular systolic function. Hopefully he has a lot of stunned myocardium that will recover. He has never had angina pectoris and did not have angina despite losing all his native proximal coronary arteries and the saphenous vein graft to the ramus intermedius.  refer to cardiac rehabilitation  3.  Moderate to severe ischemic mitral insufficiency. Mitral insufficiency is still present by physical exam and will need to be reevaluated by echo in a few months. Hopefully this will also improve with resolution of myocardial stunning. He is very vulnerable to heart failure exacerbation due to this.  Consider mitral valve repair if necessary.  4.  Right bundle branch block and left anterior fascicular block, recent development. Watch for any signs or symptoms of transient complete heart block when we increase the dose of beta blocker. AV conduction time is normal.  5.  Frequent PVCs. Carvedilol increased today  6.  Hyperlipidemia on statin therapy. Switch to generic rosuvastatin at their request.  7.  Type 2  diabetes mellitus. After hospital discharge his typical fasting blood sugar is around the 100 mg/deciliter morning.  8.  History of renal artery stenosis status post stent placement with no evidence of renal stenosis by recent duplex ultrasonography   Current medicines are reviewed at length with the patient today.  The patient has concerns regarding medicines.  The following changes have been made:   Increase carvedilol to 25 mg twice a day, decrease amlodipine to 2.5 mg daily change potassium chloride to 10 mEq twice a day (he has difficulty swallowing the large 20 mEq tablet) , change from Crestor to generic rosuvastatin 10 mg daily  Labs/ tests ordered today include:  No orders of the defined types were placed in this encounter.  Patient Instructions  Your physician has recommended you make the following change in your medication:    DECREASE AMLODIPINE TO 2.5MG  DAILY CONTINUE CARVEDILOL 25MG  TWICE DAILY CHANGE POTASSIUM TO 10MEQ STRENGTH AND TAKE TWICE DAILY  Dr. Sallyanne Kuster recommends that you schedule a follow-up appointment in: 6-8 WEEKS      Signed, Emerald Shor, MD  07/28/2015 1:24 PM    Sanda Klein, MD, [facility] [phone] office [phone] pager

## 2015-07-29 ENCOUNTER — Telehealth: Payer: Self-pay | Admitting: Cardiovascular Disease

## 2015-07-29 MED ORDER — CLOPIDOGREL BISULFATE 75 MG PO TABS: 75.0000 mg | ORAL_TABLET | Freq: Every day | ORAL | Status: DC

## 2015-07-29 MED ORDER — FUROSEMIDE 40 MG PO TABS: 40.0000 mg | ORAL_TABLET | Freq: Every day | ORAL | Status: DC

## 2015-07-29 NOTE — Addendum Note (Signed)
Addended by: Janett Labella A on: 07/29/2015 08:20 AM   Modules accepted: Orders

## 2015-07-29 NOTE — Telephone Encounter (Signed)
Rx(s) requested were sent to preferred pharmacy electronically. 

## 2015-07-29 NOTE — Telephone Encounter (Signed)
°  STAT if patient is at the pharmacy , call can be transferred to refill team.   1. Which medications need to be refilled? Clopidogrel was called in wrong yesterday,it should have been 90 days instead of 5 days30 day,Furosemide should have been 90 days also. 2. Which pharmacy/location is medication to be sent to? [phone] 3. Do they need a 30 day or 90 day supply? 90 and please do refills

## 2015-08-02 ENCOUNTER — Encounter: Payer: Self-pay | Admitting: "Endocrinology

## 2015-08-04 DIAGNOSIS — E1165 Type 2 diabetes mellitus with hyperglycemia: Secondary | ICD-10-CM | POA: Diagnosis not present

## 2015-08-04 DIAGNOSIS — E782 Mixed hyperlipidemia: Secondary | ICD-10-CM | POA: Diagnosis not present

## 2015-08-04 DIAGNOSIS — I10 Essential (primary) hypertension: Secondary | ICD-10-CM | POA: Diagnosis not present

## 2015-08-05 ENCOUNTER — Telehealth: Payer: Self-pay | Admitting: *Deleted

## 2015-08-05 LAB — HEMOGLOBIN A1C: Hgb A1c MFr Bld: 6.6 % — AB (ref 4.0–6.0)

## 2015-08-05 NOTE — Telephone Encounter (Signed)
Signed order for phase II cardiac rehab along with last office note faxed to [facility] 07/30/2015.

## 2015-08-09 DIAGNOSIS — D1809 Hemangioma of other sites: Secondary | ICD-10-CM | POA: Diagnosis not present

## 2015-08-09 DIAGNOSIS — R39198 Other difficulties with micturition: Secondary | ICD-10-CM | POA: Diagnosis not present

## 2015-08-09 DIAGNOSIS — R3915 Urgency of urination: Secondary | ICD-10-CM | POA: Diagnosis not present

## 2015-08-09 DIAGNOSIS — N281 Cyst of kidney, acquired: Secondary | ICD-10-CM | POA: Diagnosis not present

## 2015-08-11 DIAGNOSIS — Z955 Presence of coronary angioplasty implant and graft: Secondary | ICD-10-CM | POA: Diagnosis not present

## 2015-08-11 DIAGNOSIS — I252 Old myocardial infarction: Secondary | ICD-10-CM | POA: Diagnosis not present

## 2015-08-12 ENCOUNTER — Telehealth: Payer: Self-pay | Admitting: "Endocrinology

## 2015-08-12 ENCOUNTER — Encounter: Payer: Self-pay | Admitting: "Endocrinology

## 2015-08-12 ENCOUNTER — Ambulatory Visit (INDEPENDENT_AMBULATORY_CARE_PROVIDER_SITE_OTHER): Payer: Medicare Other | Admitting: "Endocrinology

## 2015-08-12 VITALS — BP 136/77 | HR 77 | Ht 72.0 in | Wt 203.2 lb

## 2015-08-12 DIAGNOSIS — I150 Renovascular hypertension: Secondary | ICD-10-CM

## 2015-08-12 DIAGNOSIS — E785 Hyperlipidemia, unspecified: Secondary | ICD-10-CM | POA: Diagnosis not present

## 2015-08-12 DIAGNOSIS — E559 Vitamin D deficiency, unspecified: Secondary | ICD-10-CM | POA: Diagnosis not present

## 2015-08-12 DIAGNOSIS — I701 Atherosclerosis of renal artery: Secondary | ICD-10-CM

## 2015-08-12 DIAGNOSIS — E1159 Type 2 diabetes mellitus with other circulatory complications: Secondary | ICD-10-CM

## 2015-08-12 MED ORDER — VITAMIN D (ERGOCALCIFEROL) 1.25 MG (50000 UNIT) PO CAPS: 50000.0000 [IU] | ORAL_CAPSULE | ORAL | Status: DC

## 2015-08-12 NOTE — Telephone Encounter (Signed)
Wife called stating that Dr. Dorris Fetch told [facility] that he had low vitamin D; however, she was confused why he didn't order a vitamin D lab.

## 2015-08-12 NOTE — Patient Instructions (Signed)

## 2015-08-12 NOTE — Progress Notes (Signed)
Subjective:    Patient ID: Christian Bowman, male    DOB: 11/20/1945,    Past Medical History  Diagnosis Date  . CAD (coronary artery disease) p CABG 2007 08/26/2013    LIMA to LAD, SVG-ramus, SVG to PDA - PLA  . Left renal artery stenosis ([place]) 08/26/2013    Genesis 6x12 stent 2007  . Postoperative atrial fibrillation ([place]) 08/26/2013  . HTN (hypertension) 08/26/2013  . Hyperlipidemia 08/26/2013  . DM2 (diabetes mellitus, type 2) ([place]) 08/26/2013  . Obesity (BMI 30.0-34.9) 08/26/2013  . Heart attack [facility])    Past Surgical History  Procedure Laterality Date  . Transesophageal echocardiogram  10/19/2005    NORMAL LV; MILD TO MODERATE AMOUNT OF SOFT ATHEROMATOUS PLAQUE OF THE THORACIC AORTA; THE LEFT ATRIUM IS MILDLY DILATED;LEFT ATRIAL APPENDAGE FUNCTION IS NORMAL;NO THROMBUS IDENTIFIED. SMALL PFO WITH RIGHT TO LEFT SHUNT  . Myocardical perfusion  10/09/2007    NORMAL PERFUSION IN ALL REGIONS;NO EVIDENCE OF INDUCIBLE ISCHEMIA;POST STRESS EF% 66  . Renal doppler  03/28/2010    RIGHT RA-NORMAL;LEFT PROXIMAL RA AT STENT-PATENT WITH NO EVIDENCE OF SIGN DIAMETER REDUCTION. R & L KIDNEYS: EQUAL IN SIZE,SYMMETRICAL IN SHAPE.   Social History   Social History  . Marital Status: Married    Spouse Name: N/A  . Number of Children: N/A  . Years of Education: N/A   Social History Main Topics  . Smoking status: Never Smoker   . Smokeless tobacco: None  . Alcohol Use: No  . Drug Use: No  . Sexual Activity: Not Asked   Other Topics Concern  . None   Social History Narrative   Outpatient Encounter Prescriptions as of 08/12/2015  Medication Sig  . amLODipine (NORVASC) 2.5 MG tablet Take 1 tablet (2.5 mg total) by mouth daily.  [name] aspirin 81 MG tablet Take 81 mg by mouth daily.  . clopidogrel (PLAVIX) 75 MG tablet Take 1 tablet (75 mg total) by mouth daily.  . furosemide (LASIX) 40 MG tablet Take 1 tablet (40 mg total) by mouth daily.  . JENTADUETO 2.01-999 MG TABS Take 1 tablet by mouth  2 (two) times daily.  [name] lisinopril (PRINIVIL,ZESTRIL) 40 MG tablet Take 1 tablet (40 mg total) by mouth daily.  . potassium chloride (K-DUR,KLOR-CON) 10 MEQ tablet Take 1 tablet (10 mEq total) by mouth 2 (two) times daily.  . rosuvastatin (CRESTOR) 10 MG tablet Take 1 tablet (10 mg total) by mouth daily.  . carvedilol (COREG) 25 MG tablet Take 1 tablet (25 mg total) by mouth 2 (two) times daily.  . Vitamin D, Ergocalciferol, (DRISDOL) 50000 UNITS CAPS capsule Take 1 capsule (50,000 Units total) by mouth every 7 (seven) days.   No facility-administered encounter medications on file as of 08/12/2015.   ALLERGIES: Allergies  Allergen Reactions  . Xanax [Alprazolam]    VACCINATION STATUS:  There is no immunization history on file for this patient.  Diabetes He presents for his follow-up diabetic visit. He has type 2 diabetes mellitus. Onset time: He was diagnosed at approximate age of 89 years. His disease course has been improving. There are no hypoglycemic associated symptoms. Pertinent negatives for hypoglycemia include no confusion, headaches, pallor or seizures. There are no diabetic associated symptoms. Pertinent negatives for diabetes include no chest pain, no fatigue, no polydipsia, no polyphagia, no polyuria and no weakness. There are no hypoglycemic complications. Symptoms are improving. Diabetic complications include heart disease. Risk factors for coronary artery disease include diabetes mellitus, dyslipidemia, hypertension, male sex  and sedentary lifestyle. Current diabetic treatment includes oral agent (dual therapy). He is compliant with treatment most of the time. His weight is stable. He participates in exercise intermittently. His home blood glucose trend is decreasing steadily. His overall blood glucose range is 140-180 mg/dl. An ACE inhibitor/angiotensin II receptor blocker is being taken.  Hyperlipidemia This is a chronic problem. The current episode started more than 1 year  ago. Exacerbating diseases include diabetes. Pertinent negatives include no chest pain, myalgias or shortness of breath. Current antihyperlipidemic treatment includes statins. Risk factors for coronary artery disease include diabetes mellitus, dyslipidemia, hypertension and male sex.  Hypertension This is a chronic problem. The current episode started more than 1 year ago. Pertinent negatives include no chest pain, headaches, neck pain, palpitations or shortness of breath. Risk factors for coronary artery disease include diabetes mellitus and dyslipidemia. Hypertensive end-organ damage includes CAD/MI.     Review of Systems  Constitutional: Negative for fatigue and unexpected weight change.  HENT: Negative for dental problem, mouth sores and trouble swallowing.   Eyes: Negative for visual disturbance.  Respiratory: Negative for cough, choking, chest tightness, shortness of breath and wheezing.   Cardiovascular: Negative for chest pain, palpitations and leg swelling.  Gastrointestinal: Negative for nausea, vomiting, abdominal pain, diarrhea, constipation and abdominal distention.  Endocrine: Negative for polydipsia, polyphagia and polyuria.  Genitourinary: Negative for dysuria, urgency, hematuria and flank pain.  Musculoskeletal: Negative for myalgias, back pain, gait problem and neck pain.  Skin: Negative for pallor, rash and wound.  Neurological: Negative for seizures, syncope, weakness, numbness and headaches.  Psychiatric/Behavioral: Negative.  Negative for confusion and dysphoric mood.    Objective:    BP 136/77 mmHg  Pulse 77  Ht 6' (1.829 m)  Wt 203 lb 3.2 oz (92.171 kg)  BMI 27.55 kg/m2  SpO2 98%  Wt Readings from Last 3 Encounters:  08/12/15 203 lb 3.2 oz (92.171 kg)  07/28/15 204 lb 3.2 oz (92.625 kg)  07/21/15 197 lb (89.359 kg)    Physical Exam  Constitutional: He is oriented to person, place, and time. He appears well-developed and well-nourished. He is cooperative. No  distress.  HENT:  Head: Normocephalic and atraumatic.  Eyes: EOM are normal.  Neck: Normal range of motion. Neck supple. No tracheal deviation present. No thyromegaly present.  Cardiovascular: Normal rate, S1 normal, S2 normal and normal heart sounds.  Exam reveals no gallop.   No murmur heard. Pulses:      Dorsalis pedis pulses are 1+ on the right side, and 1+ on the left side.       Posterior tibial pulses are 1+ on the right side, and 1+ on the left side.  Pulmonary/Chest: Breath sounds normal. No respiratory distress. He has no wheezes.  Abdominal: Soft. Bowel sounds are normal. He exhibits no distension. There is no tenderness. There is no guarding and no CVA tenderness.  Musculoskeletal: He exhibits no edema.       Right shoulder: He exhibits no swelling and no deformity.  Neurological: He is alert and oriented to person, place, and time. He has normal strength and normal reflexes. No cranial nerve deficit or sensory deficit. Gait normal.  Skin: Skin is warm and dry. No rash noted. No cyanosis. Nails show no clubbing.  Psychiatric: He has a normal mood and affect. His speech is normal and behavior is normal. Judgment and thought content normal. Cognition and memory are normal.    Results for orders placed or performed during the hospital encounter of  07/23/15  I-STAT creatinine  Result Value Ref Range   Creatinine, Ser 1.30 (H) 0.61 - 1.24 mg/dL   Complete Blood Count (Most recent): No results found for: WBC, HGB, HCT, MCV, PLT Chemistry (most recent): Lab Results  Component Value Date   CREATININE 1.30* 07/23/2015         Assessment & Plan:   1. Type 2 diabetes mellitus with other circulatory complication (HCC)    his diabetes is  complicated by coronary artery disease status post coronary artery bypass graft and recent ACS and patient remains at a high risk for more acute and chronic complications of diabetes which include CAD, CVA, CKD, retinopathy, and neuropathy.  These are all discussed in detail with the patient.  Patient came with improved A1c of 6.6%    Recent labs reviewed.   - I have re-counseled the patient on diet management and weight loss  by adopting a carbohydrate restricted / protein rich  Diet.  - Suggestion is made for patient to avoid simple carbohydrates   from their diet including Cakes , Desserts, Ice Cream,  Soda (  diet and regular) , Sweet Tea , Candies,  Chips, Cookies, Artificial Sweeteners,   and "Sugar-free" Products .  This will help patient to have stable blood glucose profile and potentially avoid unintended  Weight gain.  - Patient is advised to stick to a routine mealtimes to eat 3 meals  a day and avoid unnecessary snacks ( to snack only to correct hypoglycemia).  - The patient  will be  scheduled with Jearld Fenton, RDN, CDE for individualized DM education.  - I have approached patient with the following individualized plan to manage diabetes and patient agrees.  - I will continue JentaDueto 2.5/1000mg  po BID. He will not need insulin treatment at this point.  - Patient specific target  for A1c; LDL, HDL, Triglycerides, and  Waist Circumference were discussed in detail.  2) BP/HTN: Controlled. Continue current medications including ACEI/ARB. 3) Lipids/HPL:  continue statins. 4)  Weight/Diet: CDE consult in progress, exercise, and carbohydrates information provided. 5 ) vitamin D deficiency: New diagnosis. I will initiate vitamin D 50,000 units weekly for the next 12 weeks. 6) Chronic Care/Health Maintenance:  -Patient is  on ACEI/ARB and Statin medications and encouraged to continue to follow up with Ophthalmology, Podiatrist at least yearly or according to recommendations, and advised to  stay away from smoking. I have recommended yearly flu vaccine and pneumonia vaccination at least every 5 years; moderate intensity exercise for up to 150 minutes weekly; and  sleep for at least 7 hours a day.  I advised patient  to maintain close follow up with their PCP for primary care needs.  Patient is asked to bring meter and  blood glucose logs during their next visit.   Follow up plan: Return in about 3 months (around 11/12/2015) for diabetes, high blood pressure, high cholesterol.  Glade Lloyd, MD Phone: [phone]  Fax: [phone]   08/12/2015, 9:10 AM

## 2015-08-12 NOTE — Telephone Encounter (Signed)
To soon to test again. Will treat enough before retest.

## 2015-08-16 DIAGNOSIS — Z955 Presence of coronary angioplasty implant and graft: Secondary | ICD-10-CM | POA: Diagnosis not present

## 2015-08-16 DIAGNOSIS — I252 Old myocardial infarction: Secondary | ICD-10-CM | POA: Diagnosis not present

## 2015-08-18 DIAGNOSIS — I252 Old myocardial infarction: Secondary | ICD-10-CM | POA: Diagnosis not present

## 2015-08-18 DIAGNOSIS — Z955 Presence of coronary angioplasty implant and graft: Secondary | ICD-10-CM | POA: Diagnosis not present

## 2015-08-20 DIAGNOSIS — I252 Old myocardial infarction: Secondary | ICD-10-CM | POA: Diagnosis not present

## 2015-08-20 DIAGNOSIS — Z955 Presence of coronary angioplasty implant and graft: Secondary | ICD-10-CM | POA: Diagnosis not present

## 2015-08-23 DIAGNOSIS — I252 Old myocardial infarction: Secondary | ICD-10-CM | POA: Diagnosis not present

## 2015-08-23 DIAGNOSIS — Z955 Presence of coronary angioplasty implant and graft: Secondary | ICD-10-CM | POA: Diagnosis not present

## 2015-08-24 ENCOUNTER — Telehealth: Payer: Self-pay | Admitting: Cardiovascular Disease

## 2015-08-24 NOTE — Telephone Encounter (Signed)
Coricidin is fine with his meds.

## 2015-08-24 NOTE — Telephone Encounter (Signed)
Pt's wife called in stating that the pt has a cold and she would like to know if it will be all right for him to take Coricidin. She wanted to make sure that this drug would not interact negatively with his other medications. Please f/u with her  Thanks

## 2015-08-24 NOTE — Telephone Encounter (Signed)
Routed to [place] for advice.

## 2015-08-24 NOTE — Telephone Encounter (Signed)
Recommendations given to patient. Understanding verbalized.

## 2015-08-25 DIAGNOSIS — Z955 Presence of coronary angioplasty implant and graft: Secondary | ICD-10-CM | POA: Diagnosis not present

## 2015-08-25 DIAGNOSIS — I252 Old myocardial infarction: Secondary | ICD-10-CM | POA: Diagnosis not present

## 2015-08-30 DIAGNOSIS — I252 Old myocardial infarction: Secondary | ICD-10-CM | POA: Diagnosis not present

## 2015-08-30 DIAGNOSIS — Z955 Presence of coronary angioplasty implant and graft: Secondary | ICD-10-CM | POA: Diagnosis not present

## 2015-09-01 DIAGNOSIS — Z955 Presence of coronary angioplasty implant and graft: Secondary | ICD-10-CM | POA: Diagnosis not present

## 2015-09-01 DIAGNOSIS — I252 Old myocardial infarction: Secondary | ICD-10-CM | POA: Diagnosis not present

## 2015-09-02 DIAGNOSIS — I252 Old myocardial infarction: Secondary | ICD-10-CM | POA: Diagnosis not present

## 2015-09-02 DIAGNOSIS — Z955 Presence of coronary angioplasty implant and graft: Secondary | ICD-10-CM | POA: Diagnosis not present

## 2015-09-03 DIAGNOSIS — I252 Old myocardial infarction: Secondary | ICD-10-CM | POA: Diagnosis not present

## 2015-09-03 DIAGNOSIS — Z955 Presence of coronary angioplasty implant and graft: Secondary | ICD-10-CM | POA: Diagnosis not present

## 2015-09-06 DIAGNOSIS — Z955 Presence of coronary angioplasty implant and graft: Secondary | ICD-10-CM | POA: Diagnosis not present

## 2015-09-06 DIAGNOSIS — I252 Old myocardial infarction: Secondary | ICD-10-CM | POA: Diagnosis not present

## 2015-09-08 ENCOUNTER — Encounter: Payer: Self-pay | Admitting: Cardiovascular Disease

## 2015-09-08 ENCOUNTER — Ambulatory Visit (INDEPENDENT_AMBULATORY_CARE_PROVIDER_SITE_OTHER): Payer: Medicare Other | Admitting: Cardiovascular Disease

## 2015-09-08 VITALS — BP 144/90 | HR 64 | Resp 16 | Ht 72.0 in | Wt 202.4 lb

## 2015-09-08 DIAGNOSIS — I4891 Unspecified atrial fibrillation: Secondary | ICD-10-CM

## 2015-09-08 DIAGNOSIS — I5042 Chronic combined systolic (congestive) and diastolic (congestive) heart failure: Secondary | ICD-10-CM | POA: Diagnosis not present

## 2015-09-08 DIAGNOSIS — I2510 Atherosclerotic heart disease of native coronary artery without angina pectoris: Secondary | ICD-10-CM

## 2015-09-08 DIAGNOSIS — E785 Hyperlipidemia, unspecified: Secondary | ICD-10-CM

## 2015-09-08 DIAGNOSIS — I701 Atherosclerosis of renal artery: Secondary | ICD-10-CM

## 2015-09-08 DIAGNOSIS — I150 Renovascular hypertension: Secondary | ICD-10-CM

## 2015-09-08 DIAGNOSIS — I9789 Other postprocedural complications and disorders of the circulatory system, not elsewhere classified: Secondary | ICD-10-CM | POA: Diagnosis not present

## 2015-09-08 DIAGNOSIS — I340 Nonrheumatic mitral (valve) insufficiency: Secondary | ICD-10-CM

## 2015-09-08 MED ORDER — FUROSEMIDE 40 MG PO TABS: 20.0000 mg | ORAL_TABLET | Freq: Every day | ORAL | Status: DC

## 2015-09-08 NOTE — Progress Notes (Signed)
Patient ID: Christian Bowman, male   DOB: Sep 03, 1946, 69 y.o.   MRN: [id number]      Cardiology Office Note   Date:  09/10/2015   ID:  Christian Bowman, DOB [birth year], MRN [id number]  PCP:  Purvis Kilts, MD  Cardiologist:   Sanda Klein, MD   Chief Complaint  Patient presents with  . Follow-up    no chest pain, no shortness of breath, no edema, no pain or cramping in legs, no lightheaded or dizziness      History of Present Illness: Christian Bowman is a 69 y.o. male who presents for  Recent episode of acute on chronic systolic heart failure and placement of a new coronary stent. He has made substantial improvements in his diet and exercise level and this shows in the form of considerable weight loss. He is exercising with trainer and feels well. His average fasting blood sugar is less than 100 and only increases to 120s after a meal. He has not had problems with angina pectoris or exertional dyspnea. He is frustrated by the fact that his wife is pushing him to 100% compliance with a low-sodium low-fat diet and craves occasional meals with less healthy food. He has 2 pounds lighter than he was at his previous appointment. His blood pressure was a little elevated on initial evaluation, but he was upset when it was checked. Just 10 minutes later, his blood pressure was well within the normal range.  He is now almost 10 years status post CABG (Dr. Roxy Manns, January 2007, LIMA to LAD, SVG to ramus, SVG to PDA-PLA ) and his prior presentation was atypical , with dyspnea rather than angina. He had postoperative atrial fibrillation and underwent TEE cardioversion. He did not have mitral insufficiency at that time. Coronary disease was identified incidentally when he presented for renal artery stenting and was found to have left main coronary artery stenosis. In September 2016, he had pulmonary edema. Echocardiography demonstrated left ventricular ejection fraction of 35% and moderate to severe mitral  insufficiency. There was inferolateral akinesis. The left atrium was moderately enlarged. Diastolic dysfunction was described as "grade 1". Frequent PVCs were noted on telemetry. Coronary angiography was performed. There was total occlusion of native coronary arteries. The saphenous vein graft bypass to the OM was occluded. The saphenous vein graft bypass to the right coronary artery had a 90% stenosis which was treated with a drug-eluting stent (Xience alpine 3.25 x 15 mm placed on September 23 ) and the LIMA to the LAD was widely patent. Renal artery Dopplers did not show evidence of stenosis but there was an ill-defined mass in the left kidney measuring 2 x 3 cm, shown by MRI to be a cyst. Both kidneys were normal in size.   He also has type 2 diabetes mellitus and dyslipidemia with low HDL and elevated triglycerides. Historically, diabetes control has been erratic, much better now.   Past Medical History  Diagnosis Date  . CAD (coronary artery disease) p CABG 2007 08/26/2013    LIMA to LAD, SVG-ramus, SVG to PDA - PLA  . Left renal artery stenosis ([place]) 08/26/2013    Genesis 6x12 stent 2007  . Postoperative atrial fibrillation ([place]) 08/26/2013  . HTN (hypertension) 08/26/2013  . Hyperlipidemia 08/26/2013  . DM2 (diabetes mellitus, type 2) ([place]) 08/26/2013  . Obesity (BMI 30.0-34.9) 08/26/2013  . Heart attack [facility])     Past Surgical History  Procedure Laterality Date  . Transesophageal echocardiogram  10/19/2005    NORMAL  LV; MILD TO MODERATE AMOUNT OF SOFT ATHEROMATOUS PLAQUE OF THE THORACIC AORTA; THE LEFT ATRIUM IS MILDLY DILATED;LEFT ATRIAL APPENDAGE FUNCTION IS NORMAL;NO THROMBUS IDENTIFIED. SMALL PFO WITH RIGHT TO LEFT SHUNT  . Myocardical perfusion  10/09/2007    NORMAL PERFUSION IN ALL REGIONS;NO EVIDENCE OF INDUCIBLE ISCHEMIA;POST STRESS EF% 66  . Renal doppler  03/28/2010    RIGHT RA-NORMAL;LEFT PROXIMAL RA AT STENT-PATENT WITH NO EVIDENCE OF SIGN DIAMETER REDUCTION. R & L KIDNEYS:  EQUAL IN SIZE,SYMMETRICAL IN SHAPE.     Current Outpatient Prescriptions  Medication Sig Dispense Refill  . amLODipine (NORVASC) 2.5 MG tablet Take 1 tablet (2.5 mg total) by mouth daily. 180 tablet 3  . aspirin 81 MG tablet Take 81 mg by mouth daily.    . carvedilol (COREG) 25 MG tablet Take 1 tablet (25 mg total) by mouth 2 (two) times daily. 180 tablet 3  . clopidogrel (PLAVIX) 75 MG tablet Take 1 tablet (75 mg total) by mouth daily. 90 tablet 1  . furosemide (LASIX) 40 MG tablet Take 0.5 tablets (20 mg total) by mouth daily. 90 tablet 1  . JENTADUETO 2.01-999 MG TABS Take 1 tablet by mouth 2 (two) times daily.    [name] lisinopril (PRINIVIL,ZESTRIL) 40 MG tablet Take 1 tablet (40 mg total) by mouth daily. 90 tablet 3  . potassium chloride (K-DUR,KLOR-CON) 10 MEQ tablet Take 1 tablet (10 mEq total) by mouth 2 (two) times daily. 180 tablet 3  . rosuvastatin (CRESTOR) 10 MG tablet Take 1 tablet (10 mg total) by mouth daily. 90 tablet 3  . Vitamin D, Ergocalciferol, (DRISDOL) 50000 UNITS CAPS capsule Take 1 capsule (50,000 Units total) by mouth every 7 (seven) days. 12 capsule 0   No current facility-administered medications for this visit.    Allergies:   Xanax    Social History:  The patient  reports that he has never smoked. He does not have any smokeless tobacco history on file. He reports that he does not drink alcohol or use illicit drugs.      ROS:  Please see the history of present illness.    Otherwise, review of systems positive for none.   All other systems are reviewed and negative.    PHYSICAL EXAM: VS:  BP 144/90 mmHg  Pulse 64  Resp 16  Ht 6' (1.829 m)  Wt 202 lb 6 oz (91.797 kg)  BMI 27.44 kg/m2 , BMI Body mass index is 27.44 kg/(m^2).  General: Alert, oriented x3, no distress Head: no evidence of trauma, PERRL, EOMI, no exophtalmos or lid lag, no myxedema, no xanthelasma; normal ears, nose and oropharynx.  Soft fluid-filled mass in right submandibular area Neck:  normal jugular venous pulsations and no hepatojugular reflux; brisk carotid pulses without delay and no carotid bruits Chest: clear to auscultation, no signs of consolidation by percussion or palpation, normal fremitus, symmetrical and full respiratory excursions Cardiovascular: normal position and quality of the apical impulse, regular rhythm, normal first and second heart sounds, Grade 2/6 holosystolic murmur radiating towards the axilla, no diastolic murmurs, rubs or gallops Abdomen: no tenderness or distention, no masses by palpation, no abnormal pulsatility or arterial bruits, normal bowel sounds, no hepatosplenomegaly Extremities: no clubbing, cyanosis or edema; 2+ radial, ulnar and brachial pulses bilaterally; 2+ right femoral, posterior tibial and dorsalis pedis pulses; 2+ left femoral, posterior tibial and dorsalis pedis pulses; no subclavian or femoral bruits Neurological: grossly nonfocal Psych: euthymic mood, full affect   EKG:  EKG is not ordered today.  Recent Labs: 07/23/2015: Creatinine, Ser 1.30*    Lipid Panel No results found for: CHOL, TRIG, HDL, CHOLHDL, VLDL, LDLCALC, LDLDIRECT    Wt Readings from Last 3 Encounters:  09/08/15 202 lb 6 oz (91.797 kg)  08/12/15 203 lb 3.2 oz (92.171 kg)  07/28/15 204 lb 3.2 oz (92.625 kg)      ASSESSMENT AND PLAN:  1.  Chronic systolic and diastolic heart failure, compensated, NYHA functional class I , clinically euvolemic. We'll try to reduce his diuretic dose. Continue full dose beta blocker and ACE inhibitor.  2.  Moderate to severe ischemic cardiomyopathy. Consider reevaluation of left ventricular systolic function in another few months  3.  Moderate to severe mitral insufficiency during an episode of acute pulmonary edema,  Reevaluate by echo in another few months. By clinical exam he does not have severe mitral insufficiency  4.  Coronary artery disease status post remote bypass surgery in 2007, status post drug-eluting  stent to SVG to RCA September 2016 , occluded native arteries and occluded SVG to ramus. He has never had angina pectoris and is generally presented with dyspnea.  Continue dual antiplatelet therapy at least until September 2017  5.  Type 2 diabetes mellitus with greatly improved control, followed by Dr. Dorris Fetch  6.   Mixed hyperlipidemia , primarily low HDL and elevated triglycerides -  Recheck before his next appointment  7. Right bundle branch block and left anterior fascicular block  8.  History of renal artery stenosis status post stent placement with patent renal arteries by recent angiography in September 2016    Current medicines are reviewed at length with the patient today.  The patient does not have concerns regarding medicines.   Labs/ tests ordered today include:  No orders of the defined types were placed in this encounter.     Patient Instructions  Your physician has recommended you make the following change in your medication: DECREASE FUROSEMIDE TO 20 MG DAILY  Dr. Sallyanne Kuster recommends that you schedule a follow-up appointment in: [place], Kaisha Wachob, MD  09/10/2015 10:11 AM    Sanda Klein, MD, [facility] [phone] office [phone] pager

## 2015-09-08 NOTE — Patient Instructions (Signed)
Your physician has recommended you make the following change in your medication: DECREASE FUROSEMIDE TO 20 MG DAILY  Dr. Sallyanne Kuster recommends that you schedule a follow-up appointment in: 3 MONTHS

## 2015-09-09 DIAGNOSIS — I252 Old myocardial infarction: Secondary | ICD-10-CM | POA: Diagnosis not present

## 2015-09-09 DIAGNOSIS — Z955 Presence of coronary angioplasty implant and graft: Secondary | ICD-10-CM | POA: Diagnosis not present

## 2015-09-10 ENCOUNTER — Encounter: Payer: Self-pay | Admitting: Cardiovascular Disease

## 2015-09-10 ENCOUNTER — Telehealth: Payer: Self-pay | Admitting: *Deleted

## 2015-09-10 DIAGNOSIS — Z79899 Other long term (current) drug therapy: Secondary | ICD-10-CM

## 2015-09-10 DIAGNOSIS — I340 Nonrheumatic mitral (valve) insufficiency: Secondary | ICD-10-CM

## 2015-09-10 DIAGNOSIS — E785 Hyperlipidemia, unspecified: Secondary | ICD-10-CM

## 2015-09-10 NOTE — Telephone Encounter (Signed)
-----   Message from Sanda Klein, MD sent at 09/10/2015 10:44 AM EST ----- Please schedule for an echocardiogram for mitral regurgitation and systolic heart failure just before his follow-up appointment. He might prefer to have that same day since he travels quite a bit to get here

## 2015-09-10 NOTE — Telephone Encounter (Signed)
Order placed for echo to be done in [place] in March 2017 prior to his visit and he will have labs done early March at the [facility] close to his home.  Order mailed to patient.

## 2015-09-13 DIAGNOSIS — I252 Old myocardial infarction: Secondary | ICD-10-CM | POA: Diagnosis not present

## 2015-09-13 DIAGNOSIS — Z955 Presence of coronary angioplasty implant and graft: Secondary | ICD-10-CM | POA: Diagnosis not present

## 2015-09-15 DIAGNOSIS — I252 Old myocardial infarction: Secondary | ICD-10-CM | POA: Diagnosis not present

## 2015-09-15 DIAGNOSIS — Z955 Presence of coronary angioplasty implant and graft: Secondary | ICD-10-CM | POA: Diagnosis not present

## 2015-09-20 DIAGNOSIS — I252 Old myocardial infarction: Secondary | ICD-10-CM | POA: Diagnosis not present

## 2015-09-20 DIAGNOSIS — Z955 Presence of coronary angioplasty implant and graft: Secondary | ICD-10-CM | POA: Diagnosis not present

## 2015-09-22 DIAGNOSIS — I252 Old myocardial infarction: Secondary | ICD-10-CM | POA: Diagnosis not present

## 2015-09-22 DIAGNOSIS — Z955 Presence of coronary angioplasty implant and graft: Secondary | ICD-10-CM | POA: Diagnosis not present

## 2015-09-29 DIAGNOSIS — I252 Old myocardial infarction: Secondary | ICD-10-CM | POA: Diagnosis not present

## 2015-09-29 DIAGNOSIS — Z955 Presence of coronary angioplasty implant and graft: Secondary | ICD-10-CM | POA: Diagnosis not present

## 2015-10-01 DIAGNOSIS — Z955 Presence of coronary angioplasty implant and graft: Secondary | ICD-10-CM | POA: Diagnosis not present

## 2015-10-01 DIAGNOSIS — I252 Old myocardial infarction: Secondary | ICD-10-CM | POA: Diagnosis not present

## 2015-10-03 DIAGNOSIS — I252 Old myocardial infarction: Secondary | ICD-10-CM | POA: Diagnosis not present

## 2015-10-03 DIAGNOSIS — Z955 Presence of coronary angioplasty implant and graft: Secondary | ICD-10-CM | POA: Diagnosis not present

## 2015-10-06 DIAGNOSIS — I252 Old myocardial infarction: Secondary | ICD-10-CM | POA: Diagnosis not present

## 2015-10-06 DIAGNOSIS — Z955 Presence of coronary angioplasty implant and graft: Secondary | ICD-10-CM | POA: Diagnosis not present

## 2015-10-08 DIAGNOSIS — Z955 Presence of coronary angioplasty implant and graft: Secondary | ICD-10-CM | POA: Diagnosis not present

## 2015-10-08 DIAGNOSIS — I252 Old myocardial infarction: Secondary | ICD-10-CM | POA: Diagnosis not present

## 2015-10-09 DIAGNOSIS — I2510 Atherosclerotic heart disease of native coronary artery without angina pectoris: Secondary | ICD-10-CM | POA: Diagnosis not present

## 2015-10-09 DIAGNOSIS — I5023 Acute on chronic systolic (congestive) heart failure: Secondary | ICD-10-CM | POA: Diagnosis not present

## 2015-10-09 DIAGNOSIS — E872 Acidosis: Secondary | ICD-10-CM | POA: Diagnosis not present

## 2015-10-09 DIAGNOSIS — R000 Tachycardia, unspecified: Secondary | ICD-10-CM | POA: Diagnosis not present

## 2015-10-09 DIAGNOSIS — N179 Acute kidney failure, unspecified: Secondary | ICD-10-CM | POA: Diagnosis not present

## 2015-10-09 DIAGNOSIS — R6511 Systemic inflammatory response syndrome (SIRS) of non-infectious origin with acute organ dysfunction: Secondary | ICD-10-CM | POA: Diagnosis not present

## 2015-10-09 DIAGNOSIS — I130 Hypertensive heart and chronic kidney disease with heart failure and stage 1 through stage 4 chronic kidney disease, or unspecified chronic kidney disease: Secondary | ICD-10-CM | POA: Diagnosis not present

## 2015-10-09 DIAGNOSIS — R230 Cyanosis: Secondary | ICD-10-CM | POA: Diagnosis not present

## 2015-10-09 DIAGNOSIS — R0600 Dyspnea, unspecified: Secondary | ICD-10-CM | POA: Diagnosis not present

## 2015-10-09 DIAGNOSIS — J810 Acute pulmonary edema: Secondary | ICD-10-CM | POA: Diagnosis not present

## 2015-10-09 DIAGNOSIS — R61 Generalized hyperhidrosis: Secondary | ICD-10-CM | POA: Diagnosis not present

## 2015-10-09 DIAGNOSIS — N183 Chronic kidney disease, stage 3 (moderate): Secondary | ICD-10-CM | POA: Diagnosis not present

## 2015-10-09 DIAGNOSIS — J9601 Acute respiratory failure with hypoxia: Secondary | ICD-10-CM | POA: Diagnosis not present

## 2015-10-09 DIAGNOSIS — E1122 Type 2 diabetes mellitus with diabetic chronic kidney disease: Secondary | ICD-10-CM | POA: Diagnosis not present

## 2015-10-10 DIAGNOSIS — I5023 Acute on chronic systolic (congestive) heart failure: Secondary | ICD-10-CM | POA: Diagnosis not present

## 2015-10-10 DIAGNOSIS — Z951 Presence of aortocoronary bypass graft: Secondary | ICD-10-CM | POA: Diagnosis not present

## 2015-10-10 DIAGNOSIS — J9602 Acute respiratory failure with hypercapnia: Secondary | ICD-10-CM | POA: Diagnosis not present

## 2015-10-10 DIAGNOSIS — E1122 Type 2 diabetes mellitus with diabetic chronic kidney disease: Secondary | ICD-10-CM | POA: Diagnosis not present

## 2015-10-10 DIAGNOSIS — I252 Old myocardial infarction: Secondary | ICD-10-CM | POA: Diagnosis not present

## 2015-10-10 DIAGNOSIS — E785 Hyperlipidemia, unspecified: Secondary | ICD-10-CM | POA: Diagnosis present

## 2015-10-10 DIAGNOSIS — I509 Heart failure, unspecified: Secondary | ICD-10-CM | POA: Diagnosis not present

## 2015-10-10 DIAGNOSIS — Z781 Physical restraint status: Secondary | ICD-10-CM | POA: Diagnosis not present

## 2015-10-10 DIAGNOSIS — I2510 Atherosclerotic heart disease of native coronary artery without angina pectoris: Secondary | ICD-10-CM | POA: Diagnosis not present

## 2015-10-10 DIAGNOSIS — E872 Acidosis: Secondary | ICD-10-CM | POA: Diagnosis not present

## 2015-10-10 DIAGNOSIS — J9601 Acute respiratory failure with hypoxia: Secondary | ICD-10-CM | POA: Diagnosis not present

## 2015-10-10 DIAGNOSIS — N183 Chronic kidney disease, stage 3 (moderate): Secondary | ICD-10-CM | POA: Diagnosis not present

## 2015-10-10 DIAGNOSIS — I130 Hypertensive heart and chronic kidney disease with heart failure and stage 1 through stage 4 chronic kidney disease, or unspecified chronic kidney disease: Secondary | ICD-10-CM | POA: Diagnosis not present

## 2015-10-10 DIAGNOSIS — J9690 Respiratory failure, unspecified, unspecified whether with hypoxia or hypercapnia: Secondary | ICD-10-CM | POA: Diagnosis not present

## 2015-10-10 DIAGNOSIS — I5021 Acute systolic (congestive) heart failure: Secondary | ICD-10-CM | POA: Diagnosis not present

## 2015-10-10 DIAGNOSIS — I701 Atherosclerosis of renal artery: Secondary | ICD-10-CM | POA: Diagnosis present

## 2015-10-10 DIAGNOSIS — I501 Left ventricular failure: Secondary | ICD-10-CM | POA: Diagnosis not present

## 2015-10-10 DIAGNOSIS — J811 Chronic pulmonary edema: Secondary | ICD-10-CM | POA: Diagnosis not present

## 2015-10-10 DIAGNOSIS — I10 Essential (primary) hypertension: Secondary | ICD-10-CM | POA: Diagnosis not present

## 2015-10-10 DIAGNOSIS — N179 Acute kidney failure, unspecified: Secondary | ICD-10-CM | POA: Diagnosis not present

## 2015-10-10 DIAGNOSIS — G9341 Metabolic encephalopathy: Secondary | ICD-10-CM | POA: Diagnosis not present

## 2015-10-10 DIAGNOSIS — R6511 Systemic inflammatory response syndrome (SIRS) of non-infectious origin with acute organ dysfunction: Secondary | ICD-10-CM | POA: Diagnosis not present

## 2015-10-11 ENCOUNTER — Telehealth: Payer: Self-pay | Admitting: Cardiovascular Disease

## 2015-10-11 NOTE — Telephone Encounter (Signed)
Christian Bowman ( Resident from [facility]) is calling because Christian Bowman has been there in ICU at the hospital. [facility] is wanting to know if there is antyhing that you may want them to know or add to his care . Stated that  Christian Bowman will be moved to a [employer] , just not sure of when he will be moved . Please call at [phone] ext [phone]  Thanks

## 2015-10-12 ENCOUNTER — Telehealth: Payer: Self-pay | Admitting: Physician Assistant

## 2015-10-12 NOTE — Telephone Encounter (Signed)
Received records from [facility] for appointment on 10/20/15 with Tarri Fuller, PA.  Records given to [employer] (medical records) for Bryan's schedule on 10/20/15. lp

## 2015-10-13 NOTE — Telephone Encounter (Signed)
Called. He needs an office appt in next few weeks, please

## 2015-10-15 DIAGNOSIS — E663 Overweight: Secondary | ICD-10-CM | POA: Diagnosis not present

## 2015-10-15 DIAGNOSIS — Z1389 Encounter for screening for other disorder: Secondary | ICD-10-CM | POA: Diagnosis not present

## 2015-10-15 DIAGNOSIS — Z6826 Body mass index (BMI) 26.0-26.9, adult: Secondary | ICD-10-CM | POA: Diagnosis not present

## 2015-10-15 DIAGNOSIS — E1165 Type 2 diabetes mellitus with hyperglycemia: Secondary | ICD-10-CM | POA: Diagnosis not present

## 2015-10-15 DIAGNOSIS — I5030 Unspecified diastolic (congestive) heart failure: Secondary | ICD-10-CM | POA: Diagnosis not present

## 2015-10-15 DIAGNOSIS — I509 Heart failure, unspecified: Secondary | ICD-10-CM | POA: Diagnosis not present

## 2015-10-15 DIAGNOSIS — I5021 Acute systolic (congestive) heart failure: Secondary | ICD-10-CM | POA: Diagnosis not present

## 2015-10-20 ENCOUNTER — Ambulatory Visit (INDEPENDENT_AMBULATORY_CARE_PROVIDER_SITE_OTHER): Payer: Medicare Other | Admitting: Physician Assistant

## 2015-10-20 ENCOUNTER — Encounter: Payer: Self-pay | Admitting: Physician Assistant

## 2015-10-20 VITALS — BP 130/90 | HR 70 | Ht 72.0 in | Wt 193.0 lb

## 2015-10-20 DIAGNOSIS — I10 Essential (primary) hypertension: Secondary | ICD-10-CM | POA: Diagnosis not present

## 2015-10-20 DIAGNOSIS — I340 Nonrheumatic mitral (valve) insufficiency: Secondary | ICD-10-CM | POA: Diagnosis not present

## 2015-10-20 DIAGNOSIS — I5042 Chronic combined systolic (congestive) and diastolic (congestive) heart failure: Secondary | ICD-10-CM | POA: Diagnosis not present

## 2015-10-20 DIAGNOSIS — E785 Hyperlipidemia, unspecified: Secondary | ICD-10-CM

## 2015-10-20 NOTE — Patient Instructions (Signed)
Your physician recommends that you keep your March 20 th appointment.

## 2015-10-20 NOTE — Progress Notes (Signed)
Patient ID: Christian Bowman, male   DOB: October 09, 1945, 70 y.o.   MRN: [id number]    Date:  10/20/2015   ID:  Christian Bowman, DOB [birth year], MRN [id number]  PCP:  Purvis Kilts, MD  Primary Cardiologist:  [place]  Chief Complaint  Patient presents with  . Follow-up    no chest pain, shortness of breath, no swelling, some cramping in legs 2 weeks ago, no dizziness or lightheadedness     History of Present Illness: GHAITH Bowman is a 70 y.o. male  Christian Bowman is a 70 y.o. male with a history of chronic systolic heart failure and placement of a new coronary stent. He has made substantial improvements in his diet and exercise level and this shows in the form of considerable weight loss. He has been participating in cardiac rehabilitation.[name] His average fasting blood sugar is less than 100 and only increases to 120s after a meal. He has not had problems with angina pectoris or exertional dyspnea. He is still frustrated by the fact that his wife is pushing him to 100% compliance with a low-sodium low-fat diet and craves occasional meals with less healthy food.  He is now almost 10 years status post CABG (Dr. Roxy Manns, January 2007, LIMA to LAD, SVG to ramus, SVG to PDA-PLA ) and his prior presentation was atypical , with dyspnea rather than angina. He had postoperative atrial fibrillation and underwent TEE cardioversion. He did not have mitral insufficiency at that time. Coronary disease was identified incidentally when he presented for renal artery stenting and was found to have left main coronary artery stenosis. In September 2016, he had pulmonary edema. Echocardiography demonstrated left ventricular ejection fraction of 35% and moderate to severe mitral insufficiency. There was inferolateral akinesis. The left atrium was moderately enlarged. Diastolic dysfunction was described as "grade 1". Frequent PVCs were noted on telemetry. Coronary angiography was performed. There was total occlusion of native  coronary arteries. The saphenous vein graft bypass to the OM was occluded. The saphenous vein graft bypass to the right coronary artery had a 90% stenosis which was treated with a drug-eluting stent (Xience alpine 3.25 x 15 mm placed on September 23 ) and the LIMA to the LAD was widely patent. Renal artery Dopplers did not show evidence of stenosis but there was an ill-defined mass in the left kidney measuring 2 x 3 cm, shown by MRI to be a cyst. Both kidneys were normal in size.   He also has type 2 diabetes mellitus and dyslipidemia with low HDL and elevated triglycerides. Historically, diabetes control has been erratic, much better now.  Patient presents today for posthospital follow-up. He was admitted 10/09/2015 with acute pulmonary edema requiring intubation up at [facility]. He was diuresed and discharged about 3 days later. He reports that up until that Saturday his weights have were very stable. He was 196 pounds at the hospital on January 7 and he was discharged at 186 pounds. He says the days right up until that Saturday he felt fine without any particular complaints. No shortness of breath orthopnea PND or lower extremity edema.  He then suddenly became profoundly short of breath and was diagnosed with acute pulmonary edema(the only hospital records I have for his last set of labs).  Today he feels fine.  He is mostly concerned that he continues to lose fat and because of this, he cannot tell if he is gaining fluid weight. He says last night he slept very well and is  not having any orthopnea, PND, lower extremity edema as well as nausea, vomiting, fever, chest pain, shortness of breath, dizziness cough, congestion, abdominal pain, hematochezia, melena,, claudication.  Wt Readings from Last 3 Encounters:  10/20/15 193 lb (87.544 kg)  09/08/15 202 lb 6 oz (91.797 kg)  08/12/15 203 lb 3.2 oz (92.171 kg)     Past Medical History  Diagnosis Date  . CAD (coronary artery disease) p CABG 2007  08/26/2013    LIMA to LAD, SVG-ramus, SVG to PDA - PLA  . Left renal artery stenosis ([place]) 08/26/2013    Genesis 6x12 stent 2007  . Postoperative atrial fibrillation ([place]) 08/26/2013  . HTN (hypertension) 08/26/2013  . Hyperlipidemia 08/26/2013  . DM2 (diabetes mellitus, type 2) ([place]) 08/26/2013  . Obesity (BMI 30.0-34.9) 08/26/2013  . Heart attack [facility])     Current Outpatient Prescriptions  Medication Sig Dispense Refill  . amLODipine (NORVASC) 2.5 MG tablet Take 1 tablet (2.5 mg total) by mouth daily. 180 tablet 3  . aspirin 81 MG tablet Take 81 mg by mouth daily.    . carvedilol (COREG) 25 MG tablet Take 1 tablet (25 mg total) by mouth 2 (two) times daily. 180 tablet 3  . clopidogrel (PLAVIX) 75 MG tablet Take 1 tablet (75 mg total) by mouth daily. 90 tablet 1  . furosemide (LASIX) 40 MG tablet Take 0.5 tablets (20 mg total) by mouth daily. 90 tablet 1  . JENTADUETO 2.01-999 MG TABS Take 1 tablet by mouth 2 (two) times daily.    [name] lisinopril (PRINIVIL,ZESTRIL) 40 MG tablet Take 1 tablet (40 mg total) by mouth daily. 90 tablet 3  . potassium chloride (K-DUR,KLOR-CON) 10 MEQ tablet Take 1 tablet (10 mEq total) by mouth 2 (two) times daily. 180 tablet 3  . rosuvastatin (CRESTOR) 10 MG tablet Take 1 tablet (10 mg total) by mouth daily. 90 tablet 3   No current facility-administered medications for this visit.    Allergies:    Allergies  Allergen Reactions  . Xanax [Alprazolam]     Social History:  The patient  reports that he has never smoked. He does not have any smokeless tobacco history on file. He reports that he does not drink alcohol or use illicit drugs.   Family history:  No family history on file.  ROS:  Please see the history of present illness.  All other systems reviewed and negative.   PHYSICAL EXAM: VS:  BP 130/90 mmHg  Pulse 70  Ht 6' (1.829 m)  Wt 193 lb (87.544 kg)  BMI 26.17 kg/m2 Well nourished, well developed, in no acute distress HEENT: Pupils are  equal round react to light accommodation extraocular movements are intact.  Neck: no JVDNo cervical lymphadenopathy. Cardiac: Regular rate and rhythm 1/6 systolic murmur at LSB and axilla. Lungs:  clear to auscultation bilaterally, no wheezing, rhonchi or rales Abd: soft, nontender, positive bowel sounds all quadrants, no hepatosplenomegaly Ext: no lower extremity edema.  2+ radial and dorsalis pedis pulses. Skin: warm and dry Neuro:  Grossly normal      ASSESSMENT AND PLAN:  Problem List Items Addressed This Visit    Ischemic mitral regurgitation   Hyperlipidemia   HTN (hypertension) - Primary   Chronic combined systolic and diastolic heart failure ([place])      Patient appears to be doing well. No acute signs of heart failure. His creatinine at discharge on January 10 was 1.48. He had labs drawn on the 13th last Friday at [facility].  we'll try and get those records today. His troponin was 0.054 on the 10th which is mildly abnormal. This was likely related to heart the pocket pulmonary edema. All labs will be scanned into the computer. His wife is very strict with him as far as his sodium intake. The patient cannot recall anything suspected may have caused him to go into acute heart failure.  he had one higher sodium meal earlier in the week.    He schedule follow-up in March and also to have follow-up 2-D echocardiogram. He is okay to resume cardiac rehabilitation.

## 2015-10-25 DIAGNOSIS — Z955 Presence of coronary angioplasty implant and graft: Secondary | ICD-10-CM | POA: Diagnosis not present

## 2015-10-25 DIAGNOSIS — I252 Old myocardial infarction: Secondary | ICD-10-CM | POA: Diagnosis not present

## 2015-10-27 DIAGNOSIS — I252 Old myocardial infarction: Secondary | ICD-10-CM | POA: Diagnosis not present

## 2015-10-27 DIAGNOSIS — Z955 Presence of coronary angioplasty implant and graft: Secondary | ICD-10-CM | POA: Diagnosis not present

## 2015-10-29 DIAGNOSIS — Z955 Presence of coronary angioplasty implant and graft: Secondary | ICD-10-CM | POA: Diagnosis not present

## 2015-10-29 DIAGNOSIS — I252 Old myocardial infarction: Secondary | ICD-10-CM | POA: Diagnosis not present

## 2015-11-01 DIAGNOSIS — I252 Old myocardial infarction: Secondary | ICD-10-CM | POA: Diagnosis not present

## 2015-11-01 DIAGNOSIS — Z955 Presence of coronary angioplasty implant and graft: Secondary | ICD-10-CM | POA: Diagnosis not present

## 2015-11-03 DIAGNOSIS — I252 Old myocardial infarction: Secondary | ICD-10-CM | POA: Diagnosis not present

## 2015-11-03 DIAGNOSIS — Z955 Presence of coronary angioplasty implant and graft: Secondary | ICD-10-CM | POA: Diagnosis not present

## 2015-11-04 ENCOUNTER — Other Ambulatory Visit: Payer: Self-pay | Admitting: "Endocrinology

## 2015-11-04 DIAGNOSIS — E1159 Type 2 diabetes mellitus with other circulatory complications: Secondary | ICD-10-CM | POA: Diagnosis not present

## 2015-11-05 DIAGNOSIS — Z955 Presence of coronary angioplasty implant and graft: Secondary | ICD-10-CM | POA: Diagnosis not present

## 2015-11-05 DIAGNOSIS — I252 Old myocardial infarction: Secondary | ICD-10-CM | POA: Diagnosis not present

## 2015-11-05 LAB — HGB A1C W/O EAG: HEMOGLOBIN A1C: 6.2 % — AB (ref 4.8–5.6)

## 2015-11-05 LAB — BASIC METABOLIC PANEL
BUN/Creatinine Ratio: 15 (ref 10–22)
BUN: 17 mg/dL (ref 8–27)
CO2: 26 mmol/L (ref 18–29)
CREATININE: 1.14 mg/dL (ref 0.76–1.27)
Calcium: 9.4 mg/dL (ref 8.6–10.2)
Chloride: 98 mmol/L (ref 96–106)
GFR calc Af Amer: 77 mL/min/{1.73_m2} (ref >59)
GFR calc non Af Amer: 66 mL/min/{1.73_m2} (ref >59)
GLUCOSE: 123 mg/dL — AB (ref 65–99)
POTASSIUM: 4.2 mmol/L (ref 3.5–5.2)
SODIUM: 140 mmol/L (ref 134–144)

## 2015-11-05 NOTE — Addendum Note (Signed)
Addended by: Diana Eves on: 11/05/2015 03:08 PM   Modules accepted: Orders

## 2015-11-08 DIAGNOSIS — I252 Old myocardial infarction: Secondary | ICD-10-CM | POA: Diagnosis not present

## 2015-11-08 DIAGNOSIS — Z955 Presence of coronary angioplasty implant and graft: Secondary | ICD-10-CM | POA: Diagnosis not present

## 2015-11-10 DIAGNOSIS — Z955 Presence of coronary angioplasty implant and graft: Secondary | ICD-10-CM | POA: Diagnosis not present

## 2015-11-10 DIAGNOSIS — I252 Old myocardial infarction: Secondary | ICD-10-CM | POA: Diagnosis not present

## 2015-11-11 ENCOUNTER — Encounter: Payer: Self-pay | Admitting: "Endocrinology

## 2015-11-11 ENCOUNTER — Ambulatory Visit (INDEPENDENT_AMBULATORY_CARE_PROVIDER_SITE_OTHER): Payer: Medicare Other | Admitting: "Endocrinology

## 2015-11-11 VITALS — BP 130/80 | HR 73 | Ht 72.0 in | Wt 196.0 lb

## 2015-11-11 DIAGNOSIS — I10 Essential (primary) hypertension: Secondary | ICD-10-CM

## 2015-11-11 DIAGNOSIS — E785 Hyperlipidemia, unspecified: Secondary | ICD-10-CM

## 2015-11-11 DIAGNOSIS — E1159 Type 2 diabetes mellitus with other circulatory complications: Secondary | ICD-10-CM | POA: Diagnosis not present

## 2015-11-11 MED ORDER — METFORMIN HCL 1000 MG PO TABS: 1000.0000 mg | ORAL_TABLET | Freq: Two times a day (BID) | ORAL | Status: DC

## 2015-11-11 NOTE — Patient Instructions (Signed)

## 2015-11-11 NOTE — Progress Notes (Signed)
Subjective:    Patient ID: Christian Bowman, male    DOB: March 20, 1946,    Past Medical History  Diagnosis Date  . CAD (coronary artery disease) p CABG 2007 08/26/2013    LIMA to LAD, SVG-ramus, SVG to PDA - PLA  . Left renal artery stenosis ([place]) 08/26/2013    Genesis 6x12 stent 2007  . Postoperative atrial fibrillation ([place]) 08/26/2013  . HTN (hypertension) 08/26/2013  . Hyperlipidemia 08/26/2013  . DM2 (diabetes mellitus, type 2) ([place]) 08/26/2013  . Obesity (BMI 30.0-34.9) 08/26/2013  . Heart attack [facility])    Past Surgical History  Procedure Laterality Date  . Transesophageal echocardiogram  10/19/2005    NORMAL LV; MILD TO MODERATE AMOUNT OF SOFT ATHEROMATOUS PLAQUE OF THE THORACIC AORTA; THE LEFT ATRIUM IS MILDLY DILATED;LEFT ATRIAL APPENDAGE FUNCTION IS NORMAL;NO THROMBUS IDENTIFIED. SMALL PFO WITH RIGHT TO LEFT SHUNT  . Myocardical perfusion  10/09/2007    NORMAL PERFUSION IN ALL REGIONS;NO EVIDENCE OF INDUCIBLE ISCHEMIA;POST STRESS EF% 66  . Renal doppler  03/28/2010    RIGHT RA-NORMAL;LEFT PROXIMAL RA AT STENT-PATENT WITH NO EVIDENCE OF SIGN DIAMETER REDUCTION. R & L KIDNEYS: EQUAL IN SIZE,SYMMETRICAL IN SHAPE.   Social History   Social History  . Marital Status: Married    Spouse Name: N/A  . Number of Children: N/A  . Years of Education: N/A   Social History Main Topics  . Smoking status: Never Smoker   . Smokeless tobacco: None  . Alcohol Use: No  . Drug Use: No  . Sexual Activity: Not Asked   Other Topics Concern  . None   Social History Narrative   Outpatient Encounter Prescriptions as of 11/11/2015  Medication Sig  . amLODipine (NORVASC) 2.5 MG tablet Take 1 tablet (2.5 mg total) by mouth daily.  [name] aspirin 81 MG tablet Take 81 mg by mouth daily.  . carvedilol (COREG) 25 MG tablet Take 1 tablet (25 mg total) by mouth 2 (two) times daily.  . clopidogrel (PLAVIX) 75 MG tablet Take 1 tablet (75 mg total) by mouth daily.  . furosemide (LASIX) 40 MG tablet Take  0.5 tablets (20 mg total) by mouth daily.  [name] lisinopril (PRINIVIL,ZESTRIL) 40 MG tablet Take 1 tablet (40 mg total) by mouth daily.  . metFORMIN (GLUCOPHAGE) 1000 MG tablet Take 1 tablet (1,000 mg total) by mouth 2 (two) times daily with a meal.  . potassium chloride (K-DUR,KLOR-CON) 10 MEQ tablet Take 1 tablet (10 mEq total) by mouth 2 (two) times daily.  . rosuvastatin (CRESTOR) 10 MG tablet Take 1 tablet (10 mg total) by mouth daily.  . [DISCONTINUED] JENTADUETO 2.01-999 MG TABS Take 1 tablet by mouth 2 (two) times daily.   No facility-administered encounter medications on file as of 11/11/2015.   ALLERGIES: Allergies  Allergen Reactions  . Xanax [Alprazolam]    VACCINATION STATUS:  There is no immunization history on file for this patient.  Diabetes He presents for his follow-up diabetic visit. He has type 2 diabetes mellitus. Onset time: He was diagnosed at approximate age of 82 years. His disease course has been improving. There are no hypoglycemic associated symptoms. Pertinent negatives for hypoglycemia include no confusion, headaches, pallor or seizures. There are no diabetic associated symptoms. Pertinent negatives for diabetes include no chest pain, no fatigue, no polydipsia, no polyphagia, no polyuria and no weakness. There are no hypoglycemic complications. Symptoms are improving. Diabetic complications include heart disease. Risk factors for coronary artery disease include diabetes mellitus, dyslipidemia, hypertension, male  sex and sedentary lifestyle. Current diabetic treatment includes oral agent (dual therapy). He is compliant with treatment most of the time. His weight is decreasing steadily. He is following a diabetic diet. He participates in exercise intermittently. His home blood glucose trend is decreasing steadily. His overall blood glucose range is 130-140 mg/dl. An ACE inhibitor/angiotensin II receptor blocker is being taken.  Hyperlipidemia This is a chronic problem. The  current episode started more than 1 year ago. Exacerbating diseases include diabetes. Pertinent negatives include no chest pain, myalgias or shortness of breath. Current antihyperlipidemic treatment includes statins. Risk factors for coronary artery disease include diabetes mellitus, dyslipidemia, hypertension and male sex.  Hypertension This is a chronic problem. The current episode started more than 1 year ago. Pertinent negatives include no chest pain, headaches, neck pain, palpitations or shortness of breath. Risk factors for coronary artery disease include diabetes mellitus and dyslipidemia. Hypertensive end-organ damage includes CAD/MI.     Review of Systems  Constitutional: Negative for fatigue and unexpected weight change.  HENT: Negative for dental problem, mouth sores and trouble swallowing.   Eyes: Negative for visual disturbance.  Respiratory: Negative for cough, choking, chest tightness, shortness of breath and wheezing.   Cardiovascular: Negative for chest pain, palpitations and leg swelling.  Gastrointestinal: Negative for nausea, vomiting, abdominal pain, diarrhea, constipation and abdominal distention.  Endocrine: Negative for polydipsia, polyphagia and polyuria.  Genitourinary: Negative for dysuria, urgency, hematuria and flank pain.  Musculoskeletal: Negative for myalgias, back pain, gait problem and neck pain.  Skin: Negative for pallor, rash and wound.  Neurological: Negative for seizures, syncope, weakness, numbness and headaches.  Psychiatric/Behavioral: Negative.  Negative for confusion and dysphoric mood.    Objective:    BP 130/80 mmHg  Pulse 73  Ht 6' (1.829 m)  Wt 196 lb (88.905 kg)  BMI 26.58 kg/m2  SpO2 99%  Wt Readings from Last 3 Encounters:  11/11/15 196 lb (88.905 kg)  10/20/15 193 lb (87.544 kg)  09/08/15 202 lb 6 oz (91.797 kg)    Physical Exam  Constitutional: He is oriented to person, place, and time. He appears well-developed and  well-nourished. He is cooperative. No distress.  HENT:  Head: Normocephalic and atraumatic.  Eyes: EOM are normal.  Neck: Normal range of motion. Neck supple. No tracheal deviation present. No thyromegaly present.  Cardiovascular: Normal rate, S1 normal, S2 normal and normal heart sounds.  Exam reveals no gallop.   No murmur heard. Pulses:      Dorsalis pedis pulses are 1+ on the right side, and 1+ on the left side.       Posterior tibial pulses are 1+ on the right side, and 1+ on the left side.  Pulmonary/Chest: Breath sounds normal. No respiratory distress. He has no wheezes.  Abdominal: Soft. Bowel sounds are normal. He exhibits no distension. There is no tenderness. There is no guarding and no CVA tenderness.  Musculoskeletal: He exhibits no edema.       Right shoulder: He exhibits no swelling and no deformity.  Neurological: He is alert and oriented to person, place, and time. He has normal strength and normal reflexes. No cranial nerve deficit or sensory deficit. Gait normal.  Skin: Skin is warm and dry. No rash noted. No cyanosis. Nails show no clubbing.  Psychiatric: He has a normal mood and affect. His speech is normal and behavior is normal. Judgment and thought content normal. Cognition and memory are normal.    Results for orders placed or performed in  visit on [id number]  Basic metabolic panel  Result Value Ref Range   Glucose 123 (H) 65 - 99 mg/dL   BUN 17 8 - 27 mg/dL   Creatinine, Ser 1.14 0.76 - 1.27 mg/dL   GFR calc non Af Amer 66 >59 mL/min/1.73   GFR calc Af Amer 77 >59 mL/min/1.73   BUN/Creatinine Ratio 15 10 - 22   Sodium 140 134 - 144 mmol/L   Potassium 4.2 3.5 - 5.2 mmol/L   Chloride 98 96 - 106 mmol/L   CO2 26 18 - 29 mmol/L   Calcium 9.4 8.6 - 10.2 mg/dL  Hgb A1c w/o eAG  Result Value Ref Range   Hgb A1c MFr Bld 6.2 (H) 4.8 - 5.6 %   Complete Blood Count (Most recent): No results found for: WBC, HGB, HCT, MCV, PLT Chemistry (most recent): Lab Results   Component Value Date   NA 140 11/04/2015   K 4.2 11/04/2015   CL 98 11/04/2015   CO2 26 11/04/2015   BUN 17 11/04/2015   CREATININE 1.14 11/04/2015    Assessment & Plan:   1. Type 2 diabetes mellitus with other circulatory complication (HCC)    his diabetes is  complicated by coronary artery disease status post coronary artery bypass graft and recent ACS and patient remains at a high risk for more acute and chronic complications of diabetes which include CAD, CVA, CKD, retinopathy, and neuropathy. These are all discussed in detail with the patient.  Patient came with improved A1c of 6.2%    Recent labs reviewed.   - I have re-counseled the patient on diet management and weight loss  by adopting a carbohydrate restricted / protein rich  Diet.  - Suggestion is made for patient to avoid simple carbohydrates   from their diet including Cakes , Desserts, Ice Cream,  Soda (  diet and regular) , Sweet Tea , Candies,  Chips, Cookies, Artificial Sweeteners,   and "Sugar-free" Products .  This will help patient to have stable blood glucose profile and potentially avoid unintended  Weight gain.  - Patient is advised to stick to a routine mealtimes to eat 3 meals  a day and avoid unnecessary snacks ( to snack only to correct hypoglycemia).  - The patient  will be  scheduled with Christian Bowman, RDN, CDE for individualized DM education.  - I have approached patient with the following individualized plan to manage diabetes and patient agrees.  -  Due to cost , I will change his  JentaDueto metformin 1000 g by mouth twice a day.  - He will not need insulin treatment at this point.  - Patient specific target  for A1c; LDL, HDL, Triglycerides, and  Waist Circumference were discussed in detail.  2) BP/HTN: Controlled. Continue current medications including ACEI/ARB. 3) Lipids/HPL:  continue statins. 4)  Weight/Diet: CDE consult in progress, exercise, and carbohydrates information provided. 5 )  vitamin D deficiency: New diagnosis. I will initiate vitamin D 50,000 units weekly for the next 12 weeks. 6) Chronic Care/Health Maintenance:  -Patient is  on ACEI/ARB and Statin medications and encouraged to continue to follow up with Ophthalmology, Podiatrist at least yearly or according to recommendations, and advised to  stay away from smoking. I have recommended yearly flu vaccine and pneumonia vaccination at least every 5 years; moderate intensity exercise for up to 150 minutes weekly; and  sleep for at least 7 hours a day.  I advised patient to maintain close follow up with their  PCP for primary care needs.  Patient is asked to bring meter and  blood glucose logs during their next visit.   Follow up plan: Return in about 6 months (around 05/10/2016) for diabetes, high blood pressure, high cholesterol, follow up with pre-visit labs.  Glade Lloyd, MD Phone: [phone]  Fax: [phone]   11/11/2015, 8:52 AM

## 2015-11-12 DIAGNOSIS — I252 Old myocardial infarction: Secondary | ICD-10-CM | POA: Diagnosis not present

## 2015-11-12 DIAGNOSIS — Z955 Presence of coronary angioplasty implant and graft: Secondary | ICD-10-CM | POA: Diagnosis not present

## 2015-11-15 DIAGNOSIS — Z955 Presence of coronary angioplasty implant and graft: Secondary | ICD-10-CM | POA: Diagnosis not present

## 2015-11-15 DIAGNOSIS — I252 Old myocardial infarction: Secondary | ICD-10-CM | POA: Diagnosis not present

## 2015-11-17 DIAGNOSIS — I252 Old myocardial infarction: Secondary | ICD-10-CM | POA: Diagnosis not present

## 2015-11-17 DIAGNOSIS — Z955 Presence of coronary angioplasty implant and graft: Secondary | ICD-10-CM | POA: Diagnosis not present

## 2015-11-19 DIAGNOSIS — I252 Old myocardial infarction: Secondary | ICD-10-CM | POA: Diagnosis not present

## 2015-11-19 DIAGNOSIS — Z955 Presence of coronary angioplasty implant and graft: Secondary | ICD-10-CM | POA: Diagnosis not present

## 2015-11-22 DIAGNOSIS — I252 Old myocardial infarction: Secondary | ICD-10-CM | POA: Diagnosis not present

## 2015-11-22 DIAGNOSIS — Z955 Presence of coronary angioplasty implant and graft: Secondary | ICD-10-CM | POA: Diagnosis not present

## 2015-11-24 DIAGNOSIS — Z955 Presence of coronary angioplasty implant and graft: Secondary | ICD-10-CM | POA: Diagnosis not present

## 2015-11-24 DIAGNOSIS — I252 Old myocardial infarction: Secondary | ICD-10-CM | POA: Diagnosis not present

## 2015-11-26 DIAGNOSIS — I252 Old myocardial infarction: Secondary | ICD-10-CM | POA: Diagnosis not present

## 2015-11-26 DIAGNOSIS — Z955 Presence of coronary angioplasty implant and graft: Secondary | ICD-10-CM | POA: Diagnosis not present

## 2015-11-29 DIAGNOSIS — Z955 Presence of coronary angioplasty implant and graft: Secondary | ICD-10-CM | POA: Diagnosis not present

## 2015-11-29 DIAGNOSIS — I252 Old myocardial infarction: Secondary | ICD-10-CM | POA: Diagnosis not present

## 2015-12-01 DIAGNOSIS — Z955 Presence of coronary angioplasty implant and graft: Secondary | ICD-10-CM | POA: Diagnosis not present

## 2015-12-01 DIAGNOSIS — I252 Old myocardial infarction: Secondary | ICD-10-CM | POA: Diagnosis not present

## 2015-12-03 DIAGNOSIS — I252 Old myocardial infarction: Secondary | ICD-10-CM | POA: Diagnosis not present

## 2015-12-03 DIAGNOSIS — Z955 Presence of coronary angioplasty implant and graft: Secondary | ICD-10-CM | POA: Diagnosis not present

## 2015-12-08 ENCOUNTER — Other Ambulatory Visit: Payer: Self-pay | Admitting: Cardiovascular Disease

## 2015-12-08 DIAGNOSIS — E785 Hyperlipidemia, unspecified: Secondary | ICD-10-CM | POA: Diagnosis not present

## 2015-12-08 NOTE — Telephone Encounter (Signed)
Rx request sent to pharmacy.  

## 2015-12-20 ENCOUNTER — Telehealth: Payer: Self-pay

## 2015-12-20 ENCOUNTER — Ambulatory Visit (INDEPENDENT_AMBULATORY_CARE_PROVIDER_SITE_OTHER): Payer: Medicare Other | Admitting: Cardiovascular Disease

## 2015-12-20 ENCOUNTER — Encounter: Payer: Self-pay | Admitting: Cardiovascular Disease

## 2015-12-20 VITALS — BP 160/92 | HR 78 | Ht 72.0 in | Wt 193.7 lb

## 2015-12-20 DIAGNOSIS — I2510 Atherosclerotic heart disease of native coronary artery without angina pectoris: Secondary | ICD-10-CM

## 2015-12-20 DIAGNOSIS — I340 Nonrheumatic mitral (valve) insufficiency: Secondary | ICD-10-CM | POA: Diagnosis not present

## 2015-12-20 DIAGNOSIS — E785 Hyperlipidemia, unspecified: Secondary | ICD-10-CM

## 2015-12-20 DIAGNOSIS — I10 Essential (primary) hypertension: Secondary | ICD-10-CM

## 2015-12-20 DIAGNOSIS — I701 Atherosclerosis of renal artery: Secondary | ICD-10-CM

## 2015-12-20 DIAGNOSIS — I5042 Chronic combined systolic (congestive) and diastolic (congestive) heart failure: Secondary | ICD-10-CM

## 2015-12-20 DIAGNOSIS — E1159 Type 2 diabetes mellitus with other circulatory complications: Secondary | ICD-10-CM

## 2015-12-20 NOTE — Telephone Encounter (Signed)
Patient had blood work done at [facility]. Initially we did not receive labs via EPIC. Called customer service and requested labs.  Received labs. Dr Sallyanne Kuster reviewed labs. Labs ok.  Called patient with results. Patient voiced understanding.

## 2015-12-20 NOTE — Progress Notes (Signed)
Patient ID: Christian Bowman, male   DOB: [birth year], 70 y.o.   MRN: [id number]    Cardiology Office Note    Date:  12/22/2015   ID:  Christian Bowman, DOB 17-Oct-1945, MRN [id number]  PCP:  Purvis Kilts, MD  Cardiologist:   Sanda Klein, MD   Chief Complaint  Patient presents with  . Follow-up    3 months  pt c/o occasional SOB--is better during exercise than when sitting, right leg feels shaky and gives out occasionally; was throwing up Saturday night and BP was elevated, no other Sx.    History of Present Illness:  Christian Bowman is a 70 y.o. male with recurrent episodes of acute heart failure exacerbation on a background of ischemic cardiomyopathy, EF 35% and mitral insufficiency (estimated to be moderate by last echo in [place], probable ischemic mechanism). He has NYHA class 1-2 dyspnea and is actually enjoying gym workouts. He denies angina. He is reasonably compliant with sodium restriction, but as before his wife is more meticulous with diet management (leading to an ongoing small conflict between them). Glycemic control remains good, his weight is stable. No syncope or palpitations.  He is now almost 10 years status post CABG (Dr. Roxy Manns, January 2007, LIMA to LAD, SVG to ramus, SVG to PDA-PLA ) and his prior presentation was atypical , with dyspnea rather than angina. He had postoperative atrial fibrillation and underwent TEE cardioversion. He did not have mitral insufficiency at that time. Coronary disease was identified incidentally when he presented for renal artery stenting and was found to have left main coronary artery stenosis. In September 2016, he had pulmonary edema. Echocardiography demonstrated left ventricular ejection fraction of 35% and moderate to severe mitral insufficiency. There was inferolateral akinesis. The left atrium was moderately enlarged. Diastolic dysfunction was described as "grade 1". Frequent PVCs were noted on telemetry. Coronary angiography was performed.  There was total occlusion of native coronary arteries. The saphenous vein graft bypass to the OM was occluded. The saphenous vein graft bypass to the right coronary artery had a 90% stenosis which was treated with a drug-eluting stent (Xience alpine 3.25 x 15 mm placed on September 23 ) and the LIMA to the LAD was widely patent. Renal artery Dopplers did not show evidence of stenosis but there was an ill-defined mass in the left kidney measuring 2 x 3 cm, shown by MRI to be a cyst. Both kidneys were normal in size.   He also has type 2 diabetes mellitus and dyslipidemia with low HDL and elevated triglycerides. Historically, diabetes control has been erratic, much better now.  Past Medical History  Diagnosis Date  . CAD (coronary artery disease) p CABG 2007 08/26/2013    LIMA to LAD, SVG-ramus, SVG to PDA - PLA  . Left renal artery stenosis ([place]) 08/26/2013    Genesis 6x12 stent 2007  . Postoperative atrial fibrillation ([place]) 08/26/2013  . HTN (hypertension) 08/26/2013  . Hyperlipidemia 08/26/2013  . DM2 (diabetes mellitus, type 2) ([place]) 08/26/2013  . Obesity (BMI 30.0-34.9) 08/26/2013  . Heart attack [facility])     Past Surgical History  Procedure Laterality Date  . Transesophageal echocardiogram  10/19/2005    NORMAL LV; MILD TO MODERATE AMOUNT OF SOFT ATHEROMATOUS PLAQUE OF THE THORACIC AORTA; THE LEFT ATRIUM IS MILDLY DILATED;LEFT ATRIAL APPENDAGE FUNCTION IS NORMAL;NO THROMBUS IDENTIFIED. SMALL PFO WITH RIGHT TO LEFT SHUNT  . Myocardical perfusion  10/09/2007    NORMAL PERFUSION IN ALL REGIONS;NO EVIDENCE OF INDUCIBLE ISCHEMIA;POST STRESS EF%  66  . Renal doppler  03/28/2010    RIGHT RA-NORMAL;LEFT PROXIMAL RA AT STENT-PATENT WITH NO EVIDENCE OF SIGN DIAMETER REDUCTION. R & L KIDNEYS: EQUAL IN SIZE,SYMMETRICAL IN SHAPE.    Outpatient Prescriptions Prior to Visit  Medication Sig Dispense Refill  . amLODipine (NORVASC) 2.5 MG tablet Take 1 tablet (2.5 mg total) by mouth daily. 180 tablet 3  .  aspirin 81 MG tablet Take 81 mg by mouth daily.    . carvedilol (COREG) 25 MG tablet Take 1 tablet (25 mg total) by mouth 2 (two) times daily. 180 tablet 3  . clopidogrel (PLAVIX) 75 MG tablet Take 1 tablet (75 mg total) by mouth daily. 90 tablet 1  . furosemide (LASIX) 40 MG tablet Take 0.5 tablets (20 mg total) by mouth daily. 90 tablet 1  . lisinopril (PRINIVIL,ZESTRIL) 40 MG tablet TAKE ONE TABLET BY MOUTH ONCE DAILY 90 tablet 0  . metFORMIN (GLUCOPHAGE) 1000 MG tablet Take 1 tablet (1,000 mg total) by mouth 2 (two) times daily with a meal. 180 tablet 1  . potassium chloride (K-DUR,KLOR-CON) 10 MEQ tablet Take 1 tablet (10 mEq total) by mouth 2 (two) times daily. 180 tablet 3  . rosuvastatin (CRESTOR) 10 MG tablet Take 1 tablet (10 mg total) by mouth daily. 90 tablet 3   No facility-administered medications prior to visit.     Allergies:   Xanax   Social History   Social History  . Marital Status: Married    Spouse Name: N/A  . Number of Children: N/A  . Years of Education: N/A   Social History Main Topics  . Smoking status: Never Smoker   . Smokeless tobacco: None  . Alcohol Use: No  . Drug Use: No  . Sexual Activity: Not Asked   Other Topics Concern  . None   Social History Narrative    ROS:   Please see the history of present illness.    ROS All other systems reviewed and are negative.   PHYSICAL EXAM:   VS:  BP 160/92 mmHg  Pulse 78  Ht 6' (1.829 m)  Wt 87.862 kg (193 lb 11.2 oz)  BMI 26.26 kg/m2   General: Alert, oriented x3, no distress Head: no evidence of trauma, PERRL, EOMI, no exophtalmos or lid lag, no myxedema, no xanthelasma; normal ears, nose and oropharynx. Soft fluid-filled mass in right submandibular area Neck: normal jugular venous pulsations and no hepatojugular reflux; brisk carotid pulses without delay and no carotid bruits Chest: clear to auscultation, no signs of consolidation by percussion or palpation, normal fremitus, symmetrical and  full respiratory excursions Cardiovascular: normal position and quality of the apical impulse, regular rhythm, normal first and second heart sounds, Grade 2/6 holosystolic murmur radiating towards the axilla, no diastolic murmurs, rubs or gallops Abdomen: no tenderness or distention, no masses by palpation, no abnormal pulsatility or arterial bruits, normal bowel sounds, no hepatosplenomegaly Extremities: no clubbing, cyanosis or edema; 2+ radial, ulnar and brachial pulses bilaterally; 2+ right femoral, posterior tibial and dorsalis pedis pulses; 2+ left femoral, posterior tibial and dorsalis pedis pulses; no subclavian or femoral bruits Neurological: grossly nonfocal Psych: euthymic mood, full affect   Wt Readings from Last 3 Encounters:  12/20/15 87.862 kg (193 lb 11.2 oz)  11/11/15 88.905 kg (196 lb)  10/20/15 87.544 kg (193 lb)      Studies/Labs Reviewed:   EKG:  EKG is not ordered today.    Recent Labs: 11/04/2015: BUN 17; Creatinine, Ser 1.14; Potassium 4.2; Sodium 140  10/12/2015: TChol 122, TG 158, HDL 22, LDL 62   ASSESSMENT:    1. Chronic combined systolic and diastolic heart failure ([place])   2. Ischemic mitral regurgitation   3. Coronary artery disease involving native coronary artery of native heart without angina pectoris   4. Hyperlipidemia   5. Essential hypertension   6. Type 2 diabetes mellitus with vascular disease ([place])   7. Left renal artery stenosis (HCC)      PLAN:  In order of problems listed above:  1. CHF: appears to be euvolemic. NYHA class 1-2. On high doses of ACEi and beta blocker. Consider Entresto. 2. MR: suspect this is playing an important role and I am worried that the MR severity may be underestimated. I offered a TEE, but he is not interested. He states that he will not consider mitral valve repair even if recommended. His experience with CABG and the lengthy recovery is discouraging to him. He does not want major procedures  performed. 3. CAD:  Angina free. As above, declines any evaluation that would lead to invasive interventions 4. HLP:  Excellent LDL, very low HDL. On statin. 5. HTN: rechecked BP 130/86 6. DM: controlled. A1c 6.2% last month 7. Hx of Renal artery stenosis:  No stenosis by Duplex US in [place] 2016. Creat 1.48.    Medication Adjustments/Labs and Tests Ordered: Current medicines are reviewed at length with the patient today.  Concerns regarding medicines are outlined above.  Medication changes, Labs and Tests ordered today are listed in the Patient Instructions below. Patient Instructions  Your physician recommends that you continue on your current medications as directed. Please refer to the Current Medication list given to you today.  Dr Sallyanne Kuster recommends that you schedule a follow-up appointment in 3 months.  If you need a refill on your cardiac medications before your next appointment, please call your pharmacy.      Mikael Spray, MD  12/22/2015 9:00 PM    [address], [place], [place]  [address] Phone: [phone]; Fax: [phone]

## 2015-12-20 NOTE — Patient Instructions (Signed)
Your physician recommends that you continue on your current medications as directed. Please refer to the Current Medication list given to you today.  Dr Sallyanne Kuster recommends that you schedule a follow-up appointment in 3 months.  If you need a refill on your cardiac medications before your next appointment, please call your pharmacy.

## 2015-12-22 ENCOUNTER — Encounter: Payer: Self-pay | Admitting: Cardiovascular Disease

## 2016-01-18 ENCOUNTER — Other Ambulatory Visit: Payer: Self-pay | Admitting: Cardiovascular Disease

## 2016-01-18 NOTE — Telephone Encounter (Signed)
Rx request sent to pharmacy.  

## 2016-03-08 ENCOUNTER — Encounter: Payer: Self-pay | Admitting: Cardiovascular Disease

## 2016-03-08 ENCOUNTER — Ambulatory Visit (INDEPENDENT_AMBULATORY_CARE_PROVIDER_SITE_OTHER): Payer: Medicare Other | Admitting: Cardiovascular Disease

## 2016-03-08 VITALS — BP 120/82 | HR 64 | Ht 72.0 in | Wt 190.6 lb

## 2016-03-08 DIAGNOSIS — I2510 Atherosclerotic heart disease of native coronary artery without angina pectoris: Secondary | ICD-10-CM

## 2016-03-08 DIAGNOSIS — E1159 Type 2 diabetes mellitus with other circulatory complications: Secondary | ICD-10-CM

## 2016-03-08 DIAGNOSIS — I5042 Chronic combined systolic (congestive) and diastolic (congestive) heart failure: Secondary | ICD-10-CM | POA: Diagnosis not present

## 2016-03-08 DIAGNOSIS — I340 Nonrheumatic mitral (valve) insufficiency: Secondary | ICD-10-CM

## 2016-03-08 DIAGNOSIS — I10 Essential (primary) hypertension: Secondary | ICD-10-CM

## 2016-03-08 DIAGNOSIS — E785 Hyperlipidemia, unspecified: Secondary | ICD-10-CM

## 2016-03-08 DIAGNOSIS — I701 Atherosclerosis of renal artery: Secondary | ICD-10-CM

## 2016-03-08 MED ORDER — LISINOPRIL 40 MG PO TABS: 40.0000 mg | ORAL_TABLET | Freq: Every day | ORAL | Status: DC

## 2016-03-08 NOTE — Patient Instructions (Signed)
Your physician recommends that you continue on your current medications as directed. Please refer to the Current Medication list given to you today.  Dr Sallyanne Kuster recommends that you schedule a follow-up appointment in 3 months.  If you need a refill on your cardiac medications before your next appointment, please call your pharmacy.

## 2016-03-08 NOTE — Progress Notes (Signed)
Patient ID: Christian Bowman, male   DOB: 24-Jun-1946, 70 y.o.   MRN: [id number] Patient ID: Christian Bowman, male   DOB: 1946-07-26, 70 y.o.   MRN: [id number]    Cardiology Office Note    Date:  03/08/2016   ID:  Christian Bowman, DOB 03/10/1946, MRN [id number]  PCP:  Purvis Kilts, MD  Cardiologist:   Sanda Klein, MD   Chief Complaint  Patient presents with  . Follow-up    3 months  pt wife states he is having trouble breathing, and discomfort in chest--puts a heating pad on and it feels better--concerned about blockage; pt states no other Sx.    History of Present Illness:  Christian Bowman is a 70 y.o. male with recurrent episodes of acute heart failure exacerbation on a background of ischemic cardiomyopathy, EF 35% and mitral insufficiency (estimated to be moderate by last echo in [place], probable ischemic mechanism). He has NYHA class 1-2 dyspnea and is still going to cardiac rehabilitation phase for 2 days a week. He puts 15 pound weights on his ankles before using the stepper. He has occasional problems with left groin pain, and has mild exertional dyspnea. He denies angina. He is compliant with sodium restriction, but as before his wife is even more meticulous with diet management. Glycemic control remains good, his weight is stable. No syncope or palpitations. He brings in a detailed log of his home weights which have been very steady between 184 and 186 pounds. Our office scale appears to overestimate his home weight by about 5 pounds. He has lost a few more pounds of "true weight" since his last appointment and is almost at an ideal body mass index.  He is now almost 10 years status post CABG (Dr. Roxy Manns, January 2007, LIMA to LAD, SVG to ramus, SVG to PDA-PLA ) and his prior presentation was atypical , with dyspnea rather than angina. He had postoperative atrial fibrillation and underwent TEE cardioversion. He did not have mitral insufficiency at that time. Coronary disease was identified  incidentally when he presented for renal artery stenting and was found to have left main coronary artery stenosis. In September 2016, he had pulmonary edema. Echocardiography demonstrated left ventricular ejection fraction of 35% and moderate to severe mitral insufficiency. There was inferolateral akinesis. The left atrium was moderately enlarged. Diastolic dysfunction was described as "grade 1". Frequent PVCs were noted on telemetry. Coronary angiography was performed. There was total occlusion of native coronary arteries. The saphenous vein graft bypass to the OM was occluded. The saphenous vein graft bypass to the right coronary artery had a 90% stenosis which was treated with a drug-eluting stent (Xience alpine 3.25 x 15 mm placed on September 23 ) and the LIMA to the LAD was widely patent. Renal artery Dopplers did not show evidence of stenosis but there was an ill-defined mass in the left kidney measuring 2 x 3 cm, shown by MRI to be a cyst. Both kidneys were normal in size.   He also has type 2 diabetes mellitus and dyslipidemia with low HDL and elevated triglycerides. Historically, diabetes control has been erratic, much better now.  Past Medical History  Diagnosis Date  . CAD (coronary artery disease) p CABG 2007 08/26/2013    LIMA to LAD, SVG-ramus, SVG to PDA - PLA  . Left renal artery stenosis ([place]) 08/26/2013    Genesis 6x12 stent 2007  . Postoperative atrial fibrillation ([place]) 08/26/2013  . HTN (hypertension) 08/26/2013  . Hyperlipidemia 08/26/2013  .  DM2 (diabetes mellitus, type 2) ([place]) 08/26/2013  . Obesity (BMI 30.0-34.9) 08/26/2013  . Heart attack [facility])     Past Surgical History  Procedure Laterality Date  . Transesophageal echocardiogram  10/19/2005    NORMAL LV; MILD TO MODERATE AMOUNT OF SOFT ATHEROMATOUS PLAQUE OF THE THORACIC AORTA; THE LEFT ATRIUM IS MILDLY DILATED;LEFT ATRIAL APPENDAGE FUNCTION IS NORMAL;NO THROMBUS IDENTIFIED. SMALL PFO WITH RIGHT TO LEFT SHUNT  .  Myocardical perfusion  10/09/2007    NORMAL PERFUSION IN ALL REGIONS;NO EVIDENCE OF INDUCIBLE ISCHEMIA;POST STRESS EF% 66  . Renal doppler  03/28/2010    RIGHT RA-NORMAL;LEFT PROXIMAL RA AT STENT-PATENT WITH NO EVIDENCE OF SIGN DIAMETER REDUCTION. R & L KIDNEYS: EQUAL IN SIZE,SYMMETRICAL IN SHAPE.    Outpatient Prescriptions Prior to Visit  Medication Sig Dispense Refill  . amLODipine (NORVASC) 2.5 MG tablet Take 1 tablet (2.5 mg total) by mouth daily. 180 tablet 3  . aspirin 81 MG tablet Take 81 mg by mouth daily.    . carvedilol (COREG) 25 MG tablet Take 1 tablet (25 mg total) by mouth 2 (two) times daily. 180 tablet 3  . clopidogrel (PLAVIX) 75 MG tablet TAKE ONE TABLET BY MOUTH ONCE DAILY 90 tablet 0  . furosemide (LASIX) 40 MG tablet TAKE ONE TABLET BY MOUTH ONCE DAILY 90 tablet 0  . metFORMIN (GLUCOPHAGE) 1000 MG tablet Take 1 tablet (1,000 mg total) by mouth 2 (two) times daily with a meal. 180 tablet 1  . potassium chloride (K-DUR,KLOR-CON) 10 MEQ tablet Take 1 tablet (10 mEq total) by mouth 2 (two) times daily. 180 tablet 3  . rosuvastatin (CRESTOR) 10 MG tablet Take 1 tablet (10 mg total) by mouth daily. 90 tablet 3  . lisinopril (PRINIVIL,ZESTRIL) 40 MG tablet TAKE ONE TABLET BY MOUTH ONCE DAILY 90 tablet 0   No facility-administered medications prior to visit.     Allergies:   Xanax   Social History   Social History  . Marital Status: Married    Spouse Name: N/A  . Number of Children: N/A  . Years of Education: N/A   Social History Main Topics  . Smoking status: Never Smoker   . Smokeless tobacco: None  . Alcohol Use: No  . Drug Use: No  . Sexual Activity: Not Asked   Other Topics Concern  . None   Social History Narrative    ROS:   Please see the history of present illness.    ROS All other systems reviewed and are negative.   PHYSICAL EXAM:   VS:  BP 120/82 mmHg  Pulse 64  Ht 6' (1.829 m)  Wt 86.456 kg (190 lb 9.6 oz)  BMI 25.84 kg/m2   General:  Alert, oriented x3, no distress Head: no evidence of trauma, PERRL, EOMI, no exophtalmos or lid lag, no myxedema, no xanthelasma; normal ears, nose and oropharynx. Soft fluid-filled mass in right submandibular area Neck: normal jugular venous pulsations and no hepatojugular reflux; brisk carotid pulses without delay and no carotid bruits Chest: clear to auscultation, no signs of consolidation by percussion or palpation, normal fremitus, symmetrical and full respiratory excursions Cardiovascular: normal position and quality of the apical impulse, regular rhythm, normal first and second heart sounds, Grade 3/6 holosystolic murmur radiating towards the axilla, no diastolic murmurs, rubs or gallops Abdomen: no tenderness or distention, no masses by palpation, no abnormal pulsatility or arterial bruits, normal bowel sounds, no hepatosplenomegaly Extremities: no clubbing, cyanosis or edema; 2+ radial, ulnar and brachial pulses bilaterally; 2+ right  femoral, posterior tibial and dorsalis pedis pulses; 2+ left femoral, posterior tibial and dorsalis pedis pulses; no subclavian or femoral bruits Neurological: grossly nonfocal Psych: euthymic mood, full affect   Wt Readings from Last 3 Encounters:  03/08/16 86.456 kg (190 lb 9.6 oz)  12/20/15 87.862 kg (193 lb 11.2 oz)  11/11/15 88.905 kg (196 lb)      Studies/Labs Reviewed:   EKG:  EKG is not ordered today.    Recent Labs: 11/04/2015: BUN 17; Creatinine, Ser 1.14; Potassium 4.2; Sodium 140  10/12/2015: TChol 122, TG 158, HDL 22, LDL 62   ASSESSMENT:    1. Chronic combined systolic and diastolic heart failure ([place])   2. Ischemic mitral regurgitation   3. Coronary artery disease involving native coronary artery of native heart without angina pectoris   4. Hyperlipidemia   5. Essential hypertension   6. Type 2 diabetes mellitus with vascular disease ([place])   7. Left renal artery stenosis ([facility])      PLAN:  In order of problems listed  above:  1. CHF: appears to be euvolemic. NYHA class 1-2. On high doses of ACEi and beta blocker.  2. MR: suspect this is playing an important role and I am worried that the MR severity may be underestimated. As before, I offered a TEE, but he is not interested. He states that he will not consider mitral valve repair even if recommended. His experience with CABG and the lengthy recovery is discouraging to him. He does not want major procedures performed. 3. CAD:  Angina free. As above, declines any evaluation that would lead to invasive interventions 4. HLP:  Excellent LDL, very low HDL. On statin. 5. HTN: rechecked BP 130/86 6. DM: controlled. A1c  7. Hx of Renal artery stenosis:  No stenosis by Duplex US in [place] 2016. Creat 1.48.    Medication Adjustments/Labs and Tests Ordered: Current medicines are reviewed at length with the patient today.  Concerns regarding medicines are outlined above.  Medication changes, Labs and Tests ordered today are listed in the Patient Instructions below. Patient Instructions  Your physician recommends that you continue on your current medications as directed. Please refer to the Current Medication list given to you today.  Dr Sallyanne Kuster recommends that you schedule a follow-up appointment in 3 months.  If you need a refill on your cardiac medications before your next appointment, please call your pharmacy.       Signed, Sanda Klein, MD  03/08/2016 10:45 AM    [facility] [place], [place], [place]  [address] Phone: [phone]; Fax: [phone]

## 2016-04-01 DIAGNOSIS — E1165 Type 2 diabetes mellitus with hyperglycemia: Secondary | ICD-10-CM | POA: Diagnosis not present

## 2016-04-01 DIAGNOSIS — I110 Hypertensive heart disease with heart failure: Secondary | ICD-10-CM | POA: Diagnosis not present

## 2016-04-01 DIAGNOSIS — N179 Acute kidney failure, unspecified: Secondary | ICD-10-CM | POA: Diagnosis not present

## 2016-04-01 DIAGNOSIS — I5023 Acute on chronic systolic (congestive) heart failure: Secondary | ICD-10-CM | POA: Diagnosis not present

## 2016-04-01 DIAGNOSIS — I509 Heart failure, unspecified: Secondary | ICD-10-CM | POA: Diagnosis not present

## 2016-04-01 DIAGNOSIS — J810 Acute pulmonary edema: Secondary | ICD-10-CM | POA: Diagnosis not present

## 2016-04-01 DIAGNOSIS — Z0289 Encounter for other administrative examinations: Secondary | ICD-10-CM | POA: Diagnosis not present

## 2016-04-01 DIAGNOSIS — I701 Atherosclerosis of renal artery: Secondary | ICD-10-CM | POA: Diagnosis not present

## 2016-04-01 DIAGNOSIS — I248 Other forms of acute ischemic heart disease: Secondary | ICD-10-CM | POA: Diagnosis not present

## 2016-04-01 DIAGNOSIS — I161 Hypertensive emergency: Secondary | ICD-10-CM | POA: Diagnosis not present

## 2016-04-01 DIAGNOSIS — I10 Essential (primary) hypertension: Secondary | ICD-10-CM | POA: Diagnosis not present

## 2016-04-01 DIAGNOSIS — I2510 Atherosclerotic heart disease of native coronary artery without angina pectoris: Secondary | ICD-10-CM | POA: Diagnosis not present

## 2016-04-01 DIAGNOSIS — I5021 Acute systolic (congestive) heart failure: Secondary | ICD-10-CM | POA: Diagnosis not present

## 2016-04-01 DIAGNOSIS — I255 Ischemic cardiomyopathy: Secondary | ICD-10-CM | POA: Diagnosis not present

## 2016-04-02 DIAGNOSIS — I248 Other forms of acute ischemic heart disease: Secondary | ICD-10-CM | POA: Diagnosis not present

## 2016-04-02 DIAGNOSIS — Z888 Allergy status to other drugs, medicaments and biological substances status: Secondary | ICD-10-CM | POA: Diagnosis not present

## 2016-04-02 DIAGNOSIS — I10 Essential (primary) hypertension: Secondary | ICD-10-CM | POA: Diagnosis not present

## 2016-04-02 DIAGNOSIS — Z7984 Long term (current) use of oral hypoglycemic drugs: Secondary | ICD-10-CM | POA: Diagnosis not present

## 2016-04-02 DIAGNOSIS — I161 Hypertensive emergency: Secondary | ICD-10-CM | POA: Diagnosis not present

## 2016-04-02 DIAGNOSIS — I509 Heart failure, unspecified: Secondary | ICD-10-CM | POA: Diagnosis not present

## 2016-04-02 DIAGNOSIS — I255 Ischemic cardiomyopathy: Secondary | ICD-10-CM | POA: Diagnosis not present

## 2016-04-02 DIAGNOSIS — E1165 Type 2 diabetes mellitus with hyperglycemia: Secondary | ICD-10-CM | POA: Diagnosis not present

## 2016-04-02 DIAGNOSIS — J9601 Acute respiratory failure with hypoxia: Secondary | ICD-10-CM | POA: Diagnosis not present

## 2016-04-02 DIAGNOSIS — I2510 Atherosclerotic heart disease of native coronary artery without angina pectoris: Secondary | ICD-10-CM | POA: Diagnosis not present

## 2016-04-02 DIAGNOSIS — I701 Atherosclerosis of renal artery: Secondary | ICD-10-CM | POA: Diagnosis not present

## 2016-04-02 DIAGNOSIS — J810 Acute pulmonary edema: Secondary | ICD-10-CM | POA: Diagnosis not present

## 2016-04-02 DIAGNOSIS — Z951 Presence of aortocoronary bypass graft: Secondary | ICD-10-CM | POA: Diagnosis not present

## 2016-04-02 DIAGNOSIS — Z955 Presence of coronary angioplasty implant and graft: Secondary | ICD-10-CM | POA: Diagnosis not present

## 2016-04-02 DIAGNOSIS — I5023 Acute on chronic systolic (congestive) heart failure: Secondary | ICD-10-CM | POA: Diagnosis not present

## 2016-04-02 DIAGNOSIS — E785 Hyperlipidemia, unspecified: Secondary | ICD-10-CM | POA: Diagnosis present

## 2016-04-02 DIAGNOSIS — I110 Hypertensive heart disease with heart failure: Secondary | ICD-10-CM | POA: Diagnosis not present

## 2016-04-02 DIAGNOSIS — Z9689 Presence of other specified functional implants: Secondary | ICD-10-CM | POA: Diagnosis present

## 2016-04-02 DIAGNOSIS — N179 Acute kidney failure, unspecified: Secondary | ICD-10-CM | POA: Diagnosis not present

## 2016-04-05 ENCOUNTER — Telehealth: Payer: Self-pay | Admitting: Cardiovascular Disease

## 2016-04-05 DIAGNOSIS — Z79899 Other long term (current) drug therapy: Secondary | ICD-10-CM

## 2016-04-05 NOTE — Telephone Encounter (Signed)
Pt needs phos appt with Dr C in a week from Monday please. He was discharged from hospital in [place] C is his Cardiologist.

## 2016-04-07 NOTE — Telephone Encounter (Signed)
Follow up   Christian Bowman is needing some lab order to see if his kidneys are functioning right . Thanks

## 2016-04-07 NOTE — Telephone Encounter (Signed)
BMP ordered, called wife and she is aware. Lab slip mailed to patient home address at wife's request.  Pt will need a call back to set up extender visit - staff msg send to scheduling.

## 2016-04-07 NOTE — Telephone Encounter (Signed)
Please order the BMP and schedule extender first available

## 2016-04-07 NOTE — Telephone Encounter (Signed)
Returned call. Wife notes pt admitted Sat night to [facility] for sudden onset/exacerbation of pulm edema. Was given IV lasix, stayed until Mon morning and released after stable labwork and clear CXR.  Notes no changes to his medications at discharge, hospital wanted to have pt followed up by cardiology. She notes they recommended a recheck of BMP for renal fxn, K+ since he was given higher doses of IV lasix in hospital,  but no labs were ordered & she has no paperwork on-hand. She also states hospital records were sent to Dr. Delanna Ahmadi office but not to ours and she wants to make sure we can get these.   Pt notes no concerns and is doing well at home. Informed her I would inquire on recommended f/u time frame.  Had a previously arranged visit sched for 06/08/16 w Dr Sallyanne Kuster, but I informed her probably extender visit in interim would be sufficient. They are agreeable to this.

## 2016-04-10 ENCOUNTER — Telehealth: Payer: Self-pay | Admitting: Cardiovascular Disease

## 2016-04-13 NOTE — Telephone Encounter (Signed)
Closed encounter °

## 2016-04-19 ENCOUNTER — Other Ambulatory Visit: Payer: Self-pay | Admitting: Cardiovascular Disease

## 2016-04-19 NOTE — Telephone Encounter (Signed)
Rx(s) sent to pharmacy electronically.  

## 2016-04-20 DIAGNOSIS — E1159 Type 2 diabetes mellitus with other circulatory complications: Secondary | ICD-10-CM | POA: Diagnosis not present

## 2016-04-20 DIAGNOSIS — E785 Hyperlipidemia, unspecified: Secondary | ICD-10-CM | POA: Diagnosis not present

## 2016-04-21 LAB — CMP14+EGFR
A/G RATIO: 1.4 (ref 1.2–2.2)
ALK PHOS: 59 IU/L (ref 39–117)
ALT: 14 IU/L (ref 0–44)
AST: 13 IU/L (ref 0–40)
Albumin: 4 g/dL (ref 3.6–4.8)
BUN/Creatinine Ratio: 18 (ref 10–24)
BUN: 23 mg/dL (ref 8–27)
Bilirubin Total: 0.8 mg/dL (ref 0.0–1.2)
CO2: 22 mmol/L (ref 18–29)
Calcium: 9.3 mg/dL (ref 8.6–10.2)
Chloride: 102 mmol/L (ref 96–106)
Creatinine, Ser: 1.25 mg/dL (ref 0.76–1.27)
GFR calc Af Amer: 68 mL/min/{1.73_m2} (ref >59)
GFR calc non Af Amer: 59 mL/min/{1.73_m2} — ABNORMAL LOW (ref >59)
GLOBULIN, TOTAL: 2.8 g/dL (ref 1.5–4.5)
Glucose: 134 mg/dL — ABNORMAL HIGH (ref 65–99)
POTASSIUM: 4.1 mmol/L (ref 3.5–5.2)
SODIUM: 142 mmol/L (ref 134–144)
Total Protein: 6.8 g/dL (ref 6.0–8.5)

## 2016-04-21 LAB — LIPID PANEL
Chol/HDL Ratio: 3.4 ratio units (ref 0.0–5.0)
Cholesterol, Total: 95 mg/dL — ABNORMAL LOW (ref 100–199)
HDL: 28 mg/dL — ABNORMAL LOW (ref >39)
LDL Calculated: 45 mg/dL (ref 0–99)
Triglycerides: 111 mg/dL (ref 0–149)
VLDL Cholesterol Cal: 22 mg/dL (ref 5–40)

## 2016-04-21 LAB — HEMOGLOBIN A1C
ESTIMATED AVERAGE GLUCOSE: 120 mg/dL
HEMOGLOBIN A1C: 5.8 % — AB (ref 4.8–5.6)

## 2016-04-25 ENCOUNTER — Telehealth: Payer: Self-pay | Admitting: Cardiovascular Disease

## 2016-04-25 NOTE — Telephone Encounter (Signed)
New message          The wife is wondering what the results were on the blood work before the pt comes in on Friday

## 2016-04-25 NOTE — Telephone Encounter (Signed)
Returned call to wife. She wanted to know if Dr. Liliane Channel office had drawn BMET. Explained CMET was completed with included same labs + more. No further action needed.

## 2016-04-28 ENCOUNTER — Encounter: Payer: Self-pay | Admitting: Physician Assistant

## 2016-04-28 ENCOUNTER — Telehealth: Payer: Self-pay | Admitting: Physician Assistant

## 2016-04-28 ENCOUNTER — Ambulatory Visit (INDEPENDENT_AMBULATORY_CARE_PROVIDER_SITE_OTHER): Payer: Medicare Other | Admitting: Physician Assistant

## 2016-04-28 VITALS — BP 120/62 | HR 63 | Ht 72.0 in | Wt 180.4 lb

## 2016-04-28 DIAGNOSIS — I340 Nonrheumatic mitral (valve) insufficiency: Secondary | ICD-10-CM | POA: Diagnosis not present

## 2016-04-28 DIAGNOSIS — E119 Type 2 diabetes mellitus without complications: Secondary | ICD-10-CM

## 2016-04-28 DIAGNOSIS — I25810 Atherosclerosis of coronary artery bypass graft(s) without angina pectoris: Secondary | ICD-10-CM

## 2016-04-28 DIAGNOSIS — I10 Essential (primary) hypertension: Secondary | ICD-10-CM | POA: Diagnosis not present

## 2016-04-28 DIAGNOSIS — E785 Hyperlipidemia, unspecified: Secondary | ICD-10-CM

## 2016-04-28 DIAGNOSIS — I701 Atherosclerosis of renal artery: Secondary | ICD-10-CM

## 2016-04-28 NOTE — Progress Notes (Signed)
Cardiology Office Note    Date:  04/28/2016   ID:  ELLWYN Bowman, DOB Sep 16, 1946, MRN [id number]  PCP:  Christian Kilts, MD  Cardiologist:  Dr. Sallyanne Bowman  Chief Complaint  Patient presents with  . Follow-up    seen for Dr. Sallyanne Bowman    History of Present Illness:  Christian Bowman is a 70 y.o. male with PMH of CAD s/p CABG (10/2005 with LIMA to LAD, SVG to ramus, SVG to PDA-PLA by Dr. Roxy Manns) ICM/Chronic systolic HF with baseline EF 35%, mitral insufficiency (estimated moderate by echo in [place], probable ischemic mechanism), HTN, HLD and DM2. Based on the previous cardiology report, patient had atypical presentation prior to his CABG, with dyspnea rather than angina. He had a postoperative atrial fibrillation and underwent TEE cardioversion. Coronary disease was identified incidentally when he presented for renal artery stenting and found to have left main coronary artery stenosis.   In September 2016, he had pulmonary edema. Echocardiogram demonstrated EF of 35%, moderate to severe mitral insufficiency, inferolateral akinesis. Cardiac cath was repeated, it showed a total occlusion of native coronary arteries, and saphenous vein graft to OM was occluded. The SVG to RCA had a 90% stenosis which was treated with 3.25 x 15 mm Xience DES on 06/25/2015, his LIMA to LAD was widely patent. Renal artery Doppler did not show evidence of stenosis but there was ill-defined mass in the left kidney measuring 2 x 3 cm, follow-up MRI shows this was a cyst. He was last seen in the office on 05/08/2016 at which time he appears to be euvolemic. Dr. Sallyanne Bowman felt that his MR severity may be underestimated, he recommended the TEE, however the patient was not interested. And the patient said he will not consider mitral valve repair even if it was recommended as the previous long recovery from CABG has discouraged him from major surgery. 3 month follow-up was recommended.  Apparently since that time, he was admitted at  [facility] on 04/01/2016 for sudden onset and exacerbation of pulmonary edema. He was diuresed with IV Lasix. He was discharged on his previous dose of 40 mg daily Lasix. He has been doing well since hospitalization. He says he did have occasional episodes of shortness of breath at rest, but he think is more related to anxiety. He said unfortunately most of previous episode of spontaneous shortness of breath occurred on Saturday, therefore he become extra anxious on that day. Otherwise, he has continued to loose weight, his current weight is 180 pounds. On physical exam, he has no significant lower extremity edema, no abdominal distention, his lung is clear. He is euvolemic by physical exam. Recent CMP shows stable renal function. I have instructed the patient to take an additional dose of Lasix as needed for episodic shortness of breath. He did discuss potential possibility of getting home oxygen, although he says his O2 saturation will never dipped below 90s even with ambulation. He says he need office visit prior to resuming cardiac rehabilitation, I think cardiac rehabilitation would be beneficial for him. He has no recurrence of heart failure symptom. I have written him a note to go back to cardiac rehab.   Of note, we will request records from [facility]. According to the wife, they had a repeat echocardiogram during that hospitalization and his ejection fraction has reportedly improved to 45%. I am also interested to see the degree of his mitral valve regurgitation on the recent Echo. He continued to be very hesitant  to pursue TEE.    Past Medical History:  Diagnosis Date  . CAD (coronary artery disease) p CABG 2007 08/26/2013   LIMA to LAD, SVG-ramus, SVG to PDA - PLA  . DM2 (diabetes mellitus, type 2) ([place]) 08/26/2013  . Heart attack ([place])   . HTN (hypertension) 08/26/2013  . Hyperlipidemia 08/26/2013  . Left renal artery stenosis ([place]) 08/26/2013   Genesis 6x12  stent 2007  . Obesity (BMI 30.0-34.9) 08/26/2013  . Postoperative atrial fibrillation ([place]) 08/26/2013    Past Surgical History:  Procedure Laterality Date  . MYOCARDICAL PERFUSION  10/09/2007   NORMAL PERFUSION IN ALL REGIONS;NO EVIDENCE OF INDUCIBLE ISCHEMIA;POST STRESS EF% 66  . RENAL DOPPLER  03/28/2010   RIGHT RA-NORMAL;LEFT PROXIMAL RA AT STENT-PATENT WITH NO EVIDENCE OF SIGN DIAMETER REDUCTION. R & L KIDNEYS: EQUAL IN SIZE,SYMMETRICAL IN SHAPE.  [name] TRANSESOPHAGEAL ECHOCARDIOGRAM  10/19/2005   NORMAL LV; MILD TO MODERATE AMOUNT OF SOFT ATHEROMATOUS PLAQUE OF THE THORACIC AORTA; THE LEFT ATRIUM IS MILDLY DILATED;LEFT ATRIAL APPENDAGE FUNCTION IS NORMAL;NO THROMBUS IDENTIFIED. SMALL PFO WITH RIGHT TO LEFT SHUNT    Current Medications: Outpatient Medications Prior to Visit  Medication Sig Dispense Refill  . amLODipine (NORVASC) 2.5 MG tablet Take 1 tablet (2.5 mg total) by mouth daily. 180 tablet 3  . aspirin 81 MG tablet Take 81 mg by mouth daily.    . carvedilol (COREG) 25 MG tablet Take 1 tablet (25 mg total) by mouth 2 (two) times daily. 180 tablet 3  . clopidogrel (PLAVIX) 75 MG tablet TAKE ONE TABLET BY MOUTH ONCE DAILY 90 tablet 3  . furosemide (LASIX) 40 MG tablet TAKE ONE TABLET BY MOUTH ONCE DAILY 90 tablet 3  . lisinopril (PRINIVIL,ZESTRIL) 40 MG tablet Take 1 tablet (40 mg total) by mouth daily. 90 tablet 3  . metFORMIN (GLUCOPHAGE) 1000 MG tablet Take 1 tablet (1,000 mg total) by mouth 2 (two) times daily with a meal. 180 tablet 1  . potassium chloride (K-DUR,KLOR-CON) 10 MEQ tablet Take 1 tablet (10 mEq total) by mouth 2 (two) times daily. 180 tablet 3  . rosuvastatin (CRESTOR) 10 MG tablet Take 1 tablet (10 mg total) by mouth daily. 90 tablet 3   No facility-administered medications prior to visit.      Allergies:   Xanax [alprazolam]   Social History   Social History  . Marital status: Married    Spouse name: N/A  . Number of children: N/A  . Years of education:  N/A   Social History Main Topics  . Smoking status: Never Smoker  . Smokeless tobacco: Never Used  . Alcohol use No  . Drug use: No  . Sexual activity: Not Currently   Other Topics Concern  . None   Social History Narrative  . None     Family History:  The patient's family history includes Heart attack in his father and mother; Heart disease in his father, maternal grandmother, and mother; Hypertension in his father and sister.   ROS:   Please see the history of present illness.    ROS All other systems reviewed and are negative.   PHYSICAL EXAM:   VS:  BP 120/62   Pulse 63   Ht 6' (1.829 m)   Wt 180 lb 6.4 oz (81.8 kg)   SpO2 98%   BMI 24.47 kg/m    GEN: Well nourished, well developed, in no acute distress  HEENT: normal  Neck: no JVD, carotid bruits, or masses Cardiac: RRR; no rubs, or  gallops,no edema  3/6 systolic murmur Respiratory:  clear to auscultation bilaterally, normal work of breathing GI: soft, nontender, nondistended, + BS MS: no deformity or atrophy  Skin: warm and dry, no rash Neuro:  Alert and Oriented x 3, Strength and sensation are intact Psych: euthymic mood, full affect  Wt Readings from Last 3 Encounters:  04/28/16 180 lb 6.4 oz (81.8 kg)  03/08/16 190 lb 9.6 oz (86.5 kg)  12/20/15 193 lb 11.2 oz (87.9 kg)      Studies/Labs Reviewed:   EKG:  EKG is not ordered today.    Recent Labs: 04/20/2016: ALT 14; BUN 23; Creatinine, Ser 1.25; Potassium 4.1; Sodium 142   Lipid Panel    Component Value Date/Time   CHOL 95 (L) 04/20/2016 0805   TRIG 111 04/20/2016 0805   HDL 28 (L) 04/20/2016 0805   CHOLHDL 3.4 04/20/2016 0805   LDLCALC 45 04/20/2016 0805    Additional studies/ records that were reviewed today include:   Echo 04/03/2016 which showed EF 45%, inferolateral wall severely hypokinetic, lateral wall moderately hypokinetic, mild AR, moderate to severe MR, mild TR   ASSESSMENT:    1. Coronary artery disease involving coronary  bypass graft of native heart without angina pectoris   2. Moderate mitral regurgitation by prior echocardiogram   3. Essential hypertension   4. Hyperlipidemia   5. Diabetes mellitus type 2 in nonobese [facility])      PLAN:  In order of problems listed above:  1. Chronic systolic HF with baseline EF 35% / ICM  - euvolemic on today's exam, he has been compliant with Na and fluid restriction and daily weight. His weight has been decreasing. Will continue on current therapy. Not sure why he has episodic flash pulm edema, his BP is 120/62 today. Perhaps it is related to moderate severe MR, however patient is not interested in TEE or even surgery.  2. CAD s/p CABG (10/2005 with LIMA to LAD, SVG to ramus, SVG to PDA-PLA by Dr. Roxy Manns): No angina. Continue on aspirin and Plavix. No bleeding issues.  3. mitral insufficiency (estimated moderate by echo in [place], probable ischemic mechanism)  - Reportedly had a repeat echocardiogram in early July at [facility], will request records and compare the degree of MR with the previous echocardiogram.  4. HTN: Well controlled on amlodipine, carvedilol, lisinopril  5. HLD: On Crestor  6. DM2: On metformin. Managed by PCP.   Addendum: Medical record requsted from [facility], the patient was admitted with acute respiratory failure secondary to flash pulmonary edema. He was diuresed with IV Lasix 40 mg twice a day. He did have mildly elevated troponin at 0.09 which was felt to be troponin leak from hypertension. Repeat echocardiogram was obtained on 04/03/2016 which showed EF 45%, inferolateral wall severely hypokinetic, lateral wall moderately hypokinetic, mild AR, moderate to severe MR, mild TR   Medication Adjustments/Labs and Tests Ordered: Current medicines are reviewed at length with the patient today.  Concerns regarding medicines are outlined above.  Medication changes, Labs and Tests ordered today are listed in the Patient Instructions  below. Patient Instructions  Medication Instructions:  None  Labwork: None  Testing/Procedures: None  Follow-Up: Keep follow up appointment with Dr. Sallyanne Bowman as planned.   Any Other Special Instructions Will Be Listed Below (If Applicable).     If you need a refill on your cardiac medications before your next appointment, please call your pharmacy.      Hilbert Corrigan, [place]  04/28/2016  8:05 PM    [facility] [address], [place], [place]  [address] Phone: [phone]; Fax: [phone]

## 2016-04-28 NOTE — Telephone Encounter (Signed)
ROI faxed to [place] rec back placed in chart prep bin.

## 2016-04-28 NOTE — Patient Instructions (Signed)
Medication Instructions:  None  Labwork: None  Testing/Procedures: None  Follow-Up: Keep follow up appointment with Dr. Sallyanne Kuster as planned.   Any Other Special Instructions Will Be Listed Below (If Applicable).     If you need a refill on your cardiac medications before your next appointment, please call your pharmacy.

## 2016-05-11 ENCOUNTER — Ambulatory Visit (INDEPENDENT_AMBULATORY_CARE_PROVIDER_SITE_OTHER): Payer: Medicare Other | Admitting: "Endocrinology

## 2016-05-11 ENCOUNTER — Encounter: Payer: Self-pay | Admitting: "Endocrinology

## 2016-05-11 VITALS — BP 128/78 | HR 66 | Resp 18 | Ht 72.0 in | Wt 187.0 lb

## 2016-05-11 DIAGNOSIS — I701 Atherosclerosis of renal artery: Secondary | ICD-10-CM

## 2016-05-11 DIAGNOSIS — E785 Hyperlipidemia, unspecified: Secondary | ICD-10-CM

## 2016-05-11 DIAGNOSIS — E1159 Type 2 diabetes mellitus with other circulatory complications: Secondary | ICD-10-CM | POA: Diagnosis not present

## 2016-05-11 DIAGNOSIS — I10 Essential (primary) hypertension: Secondary | ICD-10-CM | POA: Diagnosis not present

## 2016-05-11 MED ORDER — METFORMIN HCL 500 MG PO TABS: 500.0000 mg | ORAL_TABLET | Freq: Two times a day (BID) | ORAL | 1 refills | Status: DC

## 2016-05-11 NOTE — Patient Instructions (Signed)

## 2016-05-11 NOTE — Progress Notes (Signed)
Subjective:    Patient ID: Christian Bowman, male    DOB: 15-Jun-1946,    Past Medical History:  Diagnosis Date  . CAD (coronary artery disease) p CABG 2007 08/26/2013   LIMA to LAD, SVG-ramus, SVG to PDA - PLA  . DM2 (diabetes mellitus, type 2) ([place]) 08/26/2013  . Heart attack ([place])   . HTN (hypertension) 08/26/2013  . Hyperlipidemia 08/26/2013  . Left renal artery stenosis ([place]) 08/26/2013   Genesis 6x12 stent 2007  . Obesity (BMI 30.0-34.9) 08/26/2013  . Postoperative atrial fibrillation ([place]) 08/26/2013   Past Surgical History:  Procedure Laterality Date  . MYOCARDICAL PERFUSION  10/09/2007   NORMAL PERFUSION IN ALL REGIONS;NO EVIDENCE OF INDUCIBLE ISCHEMIA;POST STRESS EF% 66  . RENAL DOPPLER  03/28/2010   RIGHT RA-NORMAL;LEFT PROXIMAL RA AT STENT-PATENT WITH NO EVIDENCE OF SIGN DIAMETER REDUCTION. R & L KIDNEYS: EQUAL IN SIZE,SYMMETRICAL IN SHAPE.  [name] TRANSESOPHAGEAL ECHOCARDIOGRAM  10/19/2005   NORMAL LV; MILD TO MODERATE AMOUNT OF SOFT ATHEROMATOUS PLAQUE OF THE THORACIC AORTA; THE LEFT ATRIUM IS MILDLY DILATED;LEFT ATRIAL APPENDAGE FUNCTION IS NORMAL;NO THROMBUS IDENTIFIED. SMALL PFO WITH RIGHT TO LEFT SHUNT   Social History   Social History  . Marital status: Married    Spouse name: N/A  . Number of children: N/A  . Years of education: N/A   Social History Main Topics  . Smoking status: Never Smoker  . Smokeless tobacco: Never Used  . Alcohol use No  . Drug use: No  . Sexual activity: Not Currently   Other Topics Concern  . None   Social History Narrative  . None   Outpatient Encounter Prescriptions as of 05/11/2016  Medication Sig  . amLODipine (NORVASC) 2.5 MG tablet Take 1 tablet (2.5 mg total) by mouth daily.  [name] aspirin 81 MG tablet Take 81 mg by mouth daily.  . carvedilol (COREG) 25 MG tablet Take 1 tablet (25 mg total) by mouth 2 (two) times daily.  . clopidogrel (PLAVIX) 75 MG tablet TAKE ONE TABLET BY MOUTH ONCE DAILY  . furosemide (LASIX) 40 MG tablet  TAKE ONE TABLET BY MOUTH ONCE DAILY  . lisinopril (PRINIVIL,ZESTRIL) 40 MG tablet Take 1 tablet (40 mg total) by mouth daily.  . metFORMIN (GLUCOPHAGE) 500 MG tablet Take 1 tablet (500 mg total) by mouth 2 (two) times daily with a meal.  . potassium chloride (K-DUR,KLOR-CON) 10 MEQ tablet Take 1 tablet (10 mEq total) by mouth 2 (two) times daily.  . rosuvastatin (CRESTOR) 10 MG tablet Take 1 tablet (10 mg total) by mouth daily.  . [DISCONTINUED] metFORMIN (GLUCOPHAGE) 1000 MG tablet Take 1 tablet (1,000 mg total) by mouth 2 (two) times daily with a meal.   No facility-administered encounter medications on file as of 05/11/2016.    ALLERGIES: Allergies  Allergen Reactions  . Xanax [Alprazolam]    VACCINATION STATUS:  There is no immunization history on file for this patient.  Diabetes  He presents for his follow-up diabetic visit. He has type 2 diabetes mellitus. Onset time: He was diagnosed at approximate age of 47 years. His disease course has been improving. There are no hypoglycemic associated symptoms. Pertinent negatives for hypoglycemia include no confusion, headaches, pallor or seizures. There are no diabetic associated symptoms. Pertinent negatives for diabetes include no chest pain, no fatigue, no polydipsia, no polyphagia, no polyuria and no weakness. There are no hypoglycemic complications. Symptoms are improving. Diabetic complications include heart disease. Risk factors for coronary artery disease include diabetes mellitus,  dyslipidemia, hypertension, male sex and sedentary lifestyle. Current diabetic treatment includes oral agent (dual therapy). He is compliant with treatment most of the time. His weight is decreasing steadily (He lost 15 pounds since December 2016.). He is following a diabetic diet. He participates in exercise intermittently. His home blood glucose trend is decreasing steadily. His overall blood glucose range is 130-140 mg/dl. An ACE inhibitor/angiotensin II receptor  blocker is being taken.  Hyperlipidemia  This is a chronic problem. The current episode started more than 1 year ago. Exacerbating diseases include diabetes. Pertinent negatives include no chest pain, myalgias or shortness of breath. Current antihyperlipidemic treatment includes statins. Risk factors for coronary artery disease include diabetes mellitus, dyslipidemia, hypertension and male sex.  Hypertension  This is a chronic problem. The current episode started more than 1 year ago. Pertinent negatives include no chest pain, headaches, neck pain, palpitations or shortness of breath. Risk factors for coronary artery disease include diabetes mellitus and dyslipidemia. Hypertensive end-organ damage includes CAD/MI.     Review of Systems  Constitutional: Negative for fatigue and unexpected weight change.  HENT: Negative for dental problem, mouth sores and trouble swallowing.   Eyes: Negative for visual disturbance.  Respiratory: Negative for cough, choking, chest tightness, shortness of breath and wheezing.   Cardiovascular: Negative for chest pain, palpitations and leg swelling.  Gastrointestinal: Negative for abdominal distention, abdominal pain, constipation, diarrhea, nausea and vomiting.  Endocrine: Negative for polydipsia, polyphagia and polyuria.  Genitourinary: Negative for dysuria, flank pain, hematuria and urgency.  Musculoskeletal: Negative for back pain, gait problem, myalgias and neck pain.  Skin: Negative for pallor, rash and wound.  Neurological: Negative for seizures, syncope, weakness, numbness and headaches.  Psychiatric/Behavioral: Negative.  Negative for confusion and dysphoric mood.    Objective:    BP 128/78   Pulse 66   Resp 18   Ht 6' (1.829 m)   Wt 187 lb (84.8 kg)   SpO2 98%   BMI 25.36 kg/m   Wt Readings from Last 3 Encounters:  05/11/16 187 lb (84.8 kg)  04/28/16 180 lb 6.4 oz (81.8 kg)  03/08/16 190 lb 9.6 oz (86.5 kg)    Physical Exam   Constitutional: He is oriented to person, place, and time. He appears well-developed and well-nourished. He is cooperative. No distress.  HENT:  Head: Normocephalic and atraumatic.  Eyes: EOM are normal.  Neck: Normal range of motion. Neck supple. No tracheal deviation present. No thyromegaly present.  Cardiovascular: Normal rate, S1 normal, S2 normal and normal heart sounds.  Exam reveals no gallop.   No murmur heard. Pulses:      Dorsalis pedis pulses are 1+ on the right side, and 1+ on the left side.       Posterior tibial pulses are 1+ on the right side, and 1+ on the left side.  Pulmonary/Chest: Breath sounds normal. No respiratory distress. He has no wheezes.  Abdominal: Soft. Bowel sounds are normal. He exhibits no distension. There is no tenderness. There is no guarding and no CVA tenderness.  Musculoskeletal: He exhibits no edema.       Right shoulder: He exhibits no swelling and no deformity.  Neurological: He is alert and oriented to person, place, and time. He has normal strength and normal reflexes. No cranial nerve deficit or sensory deficit. Gait normal.  Skin: Skin is warm and dry. No rash noted. No cyanosis. Nails show no clubbing.  Psychiatric: He has a normal mood and affect. His speech is normal and  behavior is normal. Judgment and thought content normal. Cognition and memory are normal.    Results for orders placed or performed in visit on 11/11/15  CMP14+EGFR  Result Value Ref Range   Glucose 134 (H) 65 - 99 mg/dL   BUN 23 8 - 27 mg/dL   Creatinine, Ser 1.25 0.76 - 1.27 mg/dL   GFR calc non Af Amer 59 (L) >59 mL/min/1.73   GFR calc Af Amer 68 >59 mL/min/1.73   BUN/Creatinine Ratio 18 10 - 24   Sodium 142 134 - 144 mmol/L   Potassium 4.1 3.5 - 5.2 mmol/L   Chloride 102 96 - 106 mmol/L   CO2 22 18 - 29 mmol/L   Calcium 9.3 8.6 - 10.2 mg/dL   Total Protein 6.8 6.0 - 8.5 g/dL   Albumin 4.0 3.6 - 4.8 g/dL   Globulin, Total 2.8 1.5 - 4.5 g/dL   Albumin/Globulin  Ratio 1.4 1.2 - 2.2   Bilirubin Total 0.8 0.0 - 1.2 mg/dL   Alkaline Phosphatase 59 39 - 117 IU/L   AST 13 0 - 40 IU/L   ALT 14 0 - 44 IU/L  Hemoglobin A1c  Result Value Ref Range   Hgb A1c MFr Bld 5.8 (H) 4.8 - 5.6 %   Est. average glucose Bld gHb Est-mCnc 120 mg/dL  Lipid panel  Result Value Ref Range   Cholesterol, Total 95 (L) 100 - 199 mg/dL   Triglycerides 111 0 - 149 mg/dL   HDL 28 (L) >39 mg/dL   VLDL Cholesterol Cal 22 5 - 40 mg/dL   LDL Calculated 45 0 - 99 mg/dL   Chol/HDL Ratio 3.4 0.0 - 5.0 ratio units   Chemistry (most recent): Lab Results  Component Value Date   NA 142 04/20/2016   K 4.1 04/20/2016   CL 102 04/20/2016   CO2 22 04/20/2016   BUN 23 04/20/2016   CREATININE 1.25 04/20/2016    Assessment & Plan:   1. Type 2 diabetes mellitus with other circulatory complication (HCC)   his diabetes is  complicated by coronary artery disease status post coronary artery bypass graft and recent ACS and patient remains at a high risk for more acute and chronic complications of diabetes which include CAD, CVA, CKD, retinopathy, and neuropathy. These are all discussed in detail with the patient.  He has done very well . Patient came with improved A1c of 5.8%    Recent labs reviewed, slightly increased serum creatinine than before. - I have re-counseled the patient on diet management  by adopting a carbohydrate restricted / protein rich  Diet.  - Suggestion is made for patient to avoid simple carbohydrates   from their diet including Cakes , Desserts, Ice Cream,  Soda (  diet and regular) , Sweet Tea , Candies,  Chips, Cookies, Artificial Sweeteners,   and "Sugar-free" Products .  This will help patient to have stable blood glucose profile and potentially avoid unintended  Weight gain.  - Patient is advised to stick to a routine mealtimes to eat 3 meals  a day and avoid unnecessary snacks ( to snack only to correct hypoglycemia).  - The patient  will be  scheduled with  Jearld Fenton, RDN, CDE for individualized DM education.  - I have approached patient with the following individualized plan to manage diabetes and patient agrees.  -   I will lower metformin to 500 mg by mouth twice a day.  - He will not need insulin treatment at this point.  -  Patient specific target  for A1c; LDL, HDL, Triglycerides, and  Waist Circumference were discussed in detail.  2) BP/HTN: Controlled. Continue current medications including ACEI/ARB. 3) Lipids/HPL:  continue statins. 4)  Weight/Diet: CDE consult in progress, exercise, and carbohydrates information provided. 5 ) vitamin D deficiency: He is status post therapy with  vitamin D 50,000 units weekly for 12 weeks.  6) Chronic Care/Health Maintenance:  -Patient is  on ACEI/ARB and Statin medications and encouraged to continue to follow up with Ophthalmology, Podiatrist at least yearly or according to recommendations, and advised to  stay away from smoking. I have recommended yearly flu vaccine and pneumonia vaccination at least every 5 years; moderate intensity exercise for up to 150 minutes weekly; and  sleep for at least 7 hours a day.  I advised patient to maintain close follow up with their PCP for primary care needs.  Patient is asked to bring meter and  blood glucose logs during their next visit.   Follow up plan: Return in about 7 months (around 12/09/2016) for follow up with pre-visit labs.  Glade Lloyd, MD Phone: [phone]  Fax: [phone]   05/11/2016, 9:17 AM

## 2016-06-07 ENCOUNTER — Other Ambulatory Visit: Payer: Self-pay

## 2016-06-07 MED ORDER — GLUCOSE BLOOD VI STRP: ORAL_STRIP | 5 refills | Status: DC

## 2016-06-08 ENCOUNTER — Encounter: Payer: Self-pay | Admitting: Cardiovascular Disease

## 2016-06-08 ENCOUNTER — Ambulatory Visit (INDEPENDENT_AMBULATORY_CARE_PROVIDER_SITE_OTHER): Payer: Medicare Other | Admitting: Cardiovascular Disease

## 2016-06-08 VITALS — BP 110/68 | HR 62 | Ht 72.0 in | Wt 180.8 lb

## 2016-06-08 DIAGNOSIS — I340 Nonrheumatic mitral (valve) insufficiency: Secondary | ICD-10-CM | POA: Diagnosis not present

## 2016-06-08 DIAGNOSIS — I2510 Atherosclerotic heart disease of native coronary artery without angina pectoris: Secondary | ICD-10-CM | POA: Diagnosis not present

## 2016-06-08 DIAGNOSIS — E785 Hyperlipidemia, unspecified: Secondary | ICD-10-CM | POA: Diagnosis not present

## 2016-06-08 DIAGNOSIS — I701 Atherosclerosis of renal artery: Secondary | ICD-10-CM

## 2016-06-08 DIAGNOSIS — I10 Essential (primary) hypertension: Secondary | ICD-10-CM

## 2016-06-08 DIAGNOSIS — E1159 Type 2 diabetes mellitus with other circulatory complications: Secondary | ICD-10-CM

## 2016-06-08 DIAGNOSIS — I5042 Chronic combined systolic (congestive) and diastolic (congestive) heart failure: Secondary | ICD-10-CM

## 2016-06-08 MED ORDER — FUROSEMIDE 40 MG PO TABS: 40.0000 mg | ORAL_TABLET | Freq: Two times a day (BID) | ORAL | 3 refills | Status: DC

## 2016-06-08 MED ORDER — POTASSIUM CHLORIDE CRYS ER 10 MEQ PO TBCR: 20.0000 meq | EXTENDED_RELEASE_TABLET | Freq: Two times a day (BID) | ORAL | 3 refills | Status: DC

## 2016-06-08 NOTE — Patient Instructions (Signed)
Dr Sallyanne Kuster has recommended making the following medication changes: 1. INCREASE Furosemide to 40 mg TWICE daily 2. INCREASE Potassium to 20 mEq TWICE daily  You have been referred to Dr Remus Loffler at [facility] for mitral regurgitation.  You have been referred to the heart failure clinic.  Dr Sallyanne Kuster recommends that you schedule a follow-up appointment in 3 months.  If you need a refill on your cardiac medications before your next appointment, please call your pharmacy.

## 2016-06-08 NOTE — Progress Notes (Signed)
Patient ID: Christian Bowman, male   DOB: 01/09/1946, 70 y.o.   MRN: [id number] Patient ID: Christian Bowman, male   DOB: April 20, 1946, 70 y.o.   MRN: [id number]    Cardiology Office Note    Date:  06/09/2016   ID:  Christian Bowman, DOB [birth year], MRN [id number]  PCP:  Purvis Kilts, MD  Cardiologist:   Christian Klein, MD   Chief Complaint  Patient presents with  . Follow-up    pt c/o chest pain and SOB getting worse at night time and high BP when this happens    History of Present Illness:  Christian Bowman is a 70 y.o. male with recurrent episodes of acute heart failure exacerbation on a background of ischemic cardiomyopathy, EF 35% and mitral insufficiency (estimated to be moderate by last echo in [place], probable ischemic mechanism).  He was last hospitalized in July. He had heart failure treated in [place]. He still has severe shortness of breath. Throughout the day he describes symptoms of NYHA class 2 dyspnea, but at night he has often slept sitting up with his head lying on a pillow on the dining room table due to severe orthopnea. No syncope or palpitations. He denies angina.   He is compliant with sodium restriction, but as before his wife is even more meticulous with diet management. His weight is lower and he appears to have lost some more true weight.  He brings in a detailed log of his home weights which have recently been 174-177 lb. Our office scale appears to overestimate his home weight by about 5-6 pounds. He is now at an optimal body mass index. Glycemic control remains good.  He is now 10 years status post CABG (Dr. Roxy Bowman, January 2007, LIMA to LAD, SVG to ramus, SVG to PDA-PLA ) and his prior presentation was atypical , with dyspnea rather than angina. He had postoperative atrial fibrillation and underwent TEE cardioversion. He did not have mitral insufficiency at that time. Coronary disease was identified incidentally when he presented for renal artery stenting and was found to have  left main coronary artery stenosis.  In September 2016, he had pulmonary edema and was treated in [place], [place]. Echocardiography demonstrated left ventricular ejection fraction of 35% and moderate to severe mitral insufficiency. There was inferolateral akinesis. The left atrium was moderately enlarged. Diastolic dysfunction was described as "grade 1". Frequent PVCs were noted on telemetry. Coronary angiography was performed. There was total occlusion of native coronary arteries. The saphenous vein graft bypass to the OM was occluded. The saphenous vein graft bypass to the right coronary artery had a 90% stenosis which was treated with a drug-eluting stent (Xience alpine 3.25 x 15 mm placed on September 23 ) and the LIMA to the LAD was widely patent. Renal artery Dopplers did not show evidence of stenosis but there was an ill-defined mass in the left kidney measuring 2 x 3 cm, shown by MRI to be a cyst. Both kidneys were normal in size.   He also has type 2 diabetes mellitus and dyslipidemia with low HDL and elevated triglycerides. Historically, diabetes control has been erratic, much better now.  Past Medical History:  Diagnosis Date  . CAD (coronary artery disease) p CABG 2007 08/26/2013   LIMA to LAD, SVG-ramus, SVG to PDA - PLA  . DM2 (diabetes mellitus, type 2) ([place]) 08/26/2013  . Heart attack ([place])   . HTN (hypertension) 08/26/2013  . Hyperlipidemia 08/26/2013  . Left renal artery stenosis ([facility])  08/26/2013   Genesis 6x12 stent 2007  . Obesity (BMI 30.0-34.9) 08/26/2013  . Postoperative atrial fibrillation ([place]) 08/26/2013    Past Surgical History:  Procedure Laterality Date  . MYOCARDICAL PERFUSION  10/09/2007   NORMAL PERFUSION IN ALL REGIONS;NO EVIDENCE OF INDUCIBLE ISCHEMIA;POST STRESS EF% 66  . RENAL DOPPLER  03/28/2010   RIGHT RA-NORMAL;LEFT PROXIMAL RA AT STENT-PATENT WITH NO EVIDENCE OF SIGN DIAMETER REDUCTION. R & L KIDNEYS: EQUAL IN SIZE,SYMMETRICAL IN SHAPE.  [name]  TRANSESOPHAGEAL ECHOCARDIOGRAM  10/19/2005   NORMAL LV; MILD TO MODERATE AMOUNT OF SOFT ATHEROMATOUS PLAQUE OF THE THORACIC AORTA; THE LEFT ATRIUM IS MILDLY DILATED;LEFT ATRIAL APPENDAGE FUNCTION IS NORMAL;NO THROMBUS IDENTIFIED. SMALL PFO WITH RIGHT TO LEFT SHUNT    Outpatient Medications Prior to Visit  Medication Sig Dispense Refill  . amLODipine (NORVASC) 2.5 MG tablet Take 1 tablet (2.5 mg total) by mouth daily. 180 tablet 3  . aspirin 81 MG tablet Take 81 mg by mouth daily.    . carvedilol (COREG) 25 MG tablet Take 1 tablet (25 mg total) by mouth 2 (two) times daily. 180 tablet 3  . clopidogrel (PLAVIX) 75 MG tablet TAKE ONE TABLET BY MOUTH ONCE DAILY 90 tablet 3  . glucose blood (ACCU-CHEK AVIVA) test strip Use as instructed bid E11.65 100 each 5  . lisinopril (PRINIVIL,ZESTRIL) 40 MG tablet Take 1 tablet (40 mg total) by mouth daily. 90 tablet 3  . metFORMIN (GLUCOPHAGE) 500 MG tablet Take 1 tablet (500 mg total) by mouth 2 (two) times daily with a meal. 180 tablet 1  . rosuvastatin (CRESTOR) 10 MG tablet Take 1 tablet (10 mg total) by mouth daily. 90 tablet 3  . furosemide (LASIX) 40 MG tablet TAKE ONE TABLET BY MOUTH ONCE DAILY 90 tablet 3  . potassium chloride (K-DUR,KLOR-CON) 10 MEQ tablet Take 1 tablet (10 mEq total) by mouth 2 (two) times daily. 180 tablet 3   No facility-administered medications prior to visit.      Allergies:   Xanax [alprazolam]   Social History   Social History  . Marital status: Married    Spouse name: N/A  . Number of children: N/A  . Years of education: N/A   Social History Main Topics  . Smoking status: Never Smoker  . Smokeless tobacco: Never Used  . Alcohol use No  . Drug use: No  . Sexual activity: Not Currently   Other Topics Concern  . None   Social History Narrative  . None    ROS:   Please see the history of present illness.    ROS All other systems reviewed and are negative.   PHYSICAL EXAM:   VS:  BP 110/68 (BP Location:  Right Arm, Patient Position: Sitting, Cuff Size: Normal)   Pulse 62   Ht 6' (1.829 m)   Wt 180 lb 12.8 oz (82 kg)   SpO2 98%   BMI 24.52 kg/m    General: Alert, oriented x3, no distress Head: no evidence of trauma, PERRL, EOMI, no exophtalmos or lid lag, no myxedema, no xanthelasma; normal ears, nose and oropharynx. Soft fluid-filled mass in right submandibular area Neck: normal jugular venous pulsations and no hepatojugular reflux; brisk carotid pulses without delay and no carotid bruits Chest: clear to auscultation, no signs of consolidation by percussion or palpation, normal fremitus, symmetrical and full respiratory excursions Cardiovascular: normal position and quality of the apical impulse, regular rhythm, normal first and second heart sounds, Grade 3/6 holosystolic murmur radiating towards the axilla, no diastolic  murmurs, rubs or gallops Abdomen: no tenderness or distention, no masses by palpation, no abnormal pulsatility or arterial bruits, normal bowel sounds, no hepatosplenomegaly Extremities: no clubbing, cyanosis or edema; 2+ radial, ulnar and brachial pulses bilaterally; 2+ right femoral, posterior tibial and dorsalis pedis pulses; 2+ left femoral, posterior tibial and dorsalis pedis pulses; no subclavian or femoral bruits Neurological: grossly nonfocal Psych: euthymic mood, full affect   Wt Readings from Last 3 Encounters:  06/08/16 180 lb 12.8 oz (82 kg)  05/11/16 187 lb (84.8 kg)  04/28/16 180 lb 6.4 oz (81.8 kg)      Studies/Labs Reviewed:   EKG:  EKG is not ordered today.    Recent Labs: 04/20/2016: ALT 14; BUN 23; Creatinine, Ser 1.25; Potassium 4.1; Sodium 142  10/12/2015: TChol 122, TG 158, HDL 22, LDL 62   ASSESSMENT:    1. Chronic combined systolic and diastolic heart failure ([place])   2. Ischemic mitral regurgitation   3. Coronary artery disease involving native coronary artery of native heart without angina pectoris   4. Hyperlipidemia   5. Essential  hypertension   6. Type 2 diabetes mellitus with vascular disease ([place])   7. Left renal artery stenosis (HCC)      PLAN:  In order of problems listed above:  1. CHF: appears to be euvolemic by physical exam, but has symptoms strongly suggestive of high left atrial filling pressures. It is likely that the mitral insufficiency may be the biggest problem. On high doses of ACEi and beta blocker. Will increase his diuretic dose. Would also like to enroll him in the heart failure clinic, which might help [place] avoid repeat hospitalizations if he can give him urgent intravenous diuretics, also to discuss advanced therapies should he not be a candidate for mitral valve surgical repair. 2. MR: I suspect this is playing an important role and I am worried that the MR severity may be underestimated. Will need a TEE to see if he is a candidate for mitral clip or surgical mitral valve repair. We discussed in the past, he declined further evaluation since he did not want any invasive procedures.  His experience with CABG and the lengthy recovery is discouraging to him. His symptoms have now worsened to the degree that he "just doesn't want to live like this" and is willing to consider repeat surgery. We'll arrange a consultation with Dr. Roxy Bowman. 3. CAD:  Angina free. Last evaluation was the cardiac catheterization performed in [place] when he received a stent in the SVG to RCA. All the native coronaries were occluded, SVG to OM was occluded, but the arterial bypasses to the LAD and ramus were patent. If he is felt to be a surgical candidate will have to have a repeat coronary angiogram.  4. HLP:  Excellent LDL, very low HDL. On statin. Suspect his HDL might have improved since he has lost some atrial weight 5. HTN: Well controlled 6. DM: Good glycemic control  7. Hx of Renal artery stenosis:  No stenosis by Duplex US in [place] Sept 2016. Creat 1.48.    Medication Adjustments/Labs and Tests Ordered: Current  medicines are reviewed at length with the patient today.  Concerns regarding medicines are outlined above.  Medication changes, Labs and Tests ordered today are listed in the Patient Instructions below. Patient Instructions  Dr Sallyanne Kuster has recommended making the following medication changes: 1. INCREASE Furosemide to 40 mg TWICE daily 2. INCREASE Potassium to 20 mEq TWICE daily  You have been referred to Dr  Remus Loffler at [facility] for mitral regurgitation.  You have been referred to the heart failure clinic.  Dr Sallyanne Kuster recommends that you schedule a follow-up appointment in 3 months.  If you need a refill on your cardiac medications before your next appointment, please call your pharmacy.      Signed, Christian Klein, MD  06/09/2016 1:06 PM    [facility] [address], [place], [place]  [address] Phone: [phone]; Fax: [phone]

## 2016-06-09 ENCOUNTER — Other Ambulatory Visit: Payer: Self-pay | Admitting: *Deleted

## 2016-06-12 ENCOUNTER — Other Ambulatory Visit: Payer: Self-pay | Admitting: "Endocrinology

## 2016-06-12 ENCOUNTER — Telehealth: Payer: Self-pay | Admitting: "Endocrinology

## 2016-06-12 ENCOUNTER — Encounter: Payer: Medicare Other | Admitting: Thoracic Surgery (Cardiothoracic Vascular Surgery)

## 2016-06-12 ENCOUNTER — Telehealth: Payer: Self-pay | Admitting: Cardiovascular Disease

## 2016-06-12 MED ORDER — METFORMIN HCL 500 MG PO TABS: 500.0000 mg | ORAL_TABLET | Freq: Two times a day (BID) | ORAL | 1 refills | Status: DC

## 2016-06-12 NOTE — Telephone Encounter (Signed)
Please send metformin 500mg  to the pharmacy [facility] in [place]

## 2016-06-12 NOTE — Telephone Encounter (Signed)
New Message    Pt c/o medication issue:  1. Name of Medication: furosemide .... Potassium   2. How are you currently taking this medication (dosage and times per day)? 40mg ...Marland KitchenMarland KitchenMarland Kitchen 10 meq  3. Are you having a reaction (difficulty breathing--STAT)? No  4. What is your medication issue? Pt daughter call requesting to speak with RN about pt dosage being changed for fluid and potassium pill  please call back to discuss

## 2016-06-12 NOTE — Telephone Encounter (Signed)
Attempt to return call-no answer, [email or web address]. 

## 2016-06-13 ENCOUNTER — Other Ambulatory Visit: Payer: Self-pay

## 2016-06-13 DIAGNOSIS — I340 Nonrheumatic mitral (valve) insufficiency: Secondary | ICD-10-CM

## 2016-06-15 ENCOUNTER — Ambulatory Visit (HOSPITAL_COMMUNITY)
Admission: RE | Admit: 2016-06-15 | Discharge: 2016-06-15 | Disposition: A | Discharge status: Home / Self Care | Payer: Medicare Other | Source: Ambulatory Visit | Attending: Cardiovascular Disease | Admitting: Cardiovascular Disease

## 2016-06-15 ENCOUNTER — Encounter (HOSPITAL_COMMUNITY): Payer: Self-pay | Admitting: *Deleted

## 2016-06-15 ENCOUNTER — Other Ambulatory Visit: Payer: Self-pay

## 2016-06-15 ENCOUNTER — Ambulatory Visit (HOSPITAL_BASED_OUTPATIENT_CLINIC_OR_DEPARTMENT_OTHER)
Admission: RE | Admit: 2016-06-15 | Discharge: 2016-06-15 | Disposition: A | Discharge status: Home / Self Care | Payer: Medicare Other | Source: Ambulatory Visit | Attending: Cardiovascular Disease | Admitting: Cardiovascular Disease

## 2016-06-15 ENCOUNTER — Encounter (HOSPITAL_COMMUNITY)
Admission: RE | Disposition: A | Discharge status: Home / Self Care | Payer: Self-pay | Source: Ambulatory Visit | Attending: Cardiovascular Disease

## 2016-06-15 ENCOUNTER — Ambulatory Visit (HOSPITAL_COMMUNITY): Payer: Medicare Other

## 2016-06-15 DIAGNOSIS — I340 Nonrheumatic mitral (valve) insufficiency: Secondary | ICD-10-CM | POA: Insufficient documentation

## 2016-06-15 DIAGNOSIS — Z955 Presence of coronary angioplasty implant and graft: Secondary | ICD-10-CM | POA: Diagnosis not present

## 2016-06-15 DIAGNOSIS — I252 Old myocardial infarction: Secondary | ICD-10-CM | POA: Insufficient documentation

## 2016-06-15 DIAGNOSIS — I2510 Atherosclerotic heart disease of native coronary artery without angina pectoris: Secondary | ICD-10-CM | POA: Insufficient documentation

## 2016-06-15 DIAGNOSIS — E781 Pure hyperglyceridemia: Secondary | ICD-10-CM | POA: Diagnosis not present

## 2016-06-15 DIAGNOSIS — Z951 Presence of aortocoronary bypass graft: Secondary | ICD-10-CM | POA: Diagnosis not present

## 2016-06-15 DIAGNOSIS — E119 Type 2 diabetes mellitus without complications: Secondary | ICD-10-CM | POA: Diagnosis not present

## 2016-06-15 DIAGNOSIS — I5042 Chronic combined systolic (congestive) and diastolic (congestive) heart failure: Secondary | ICD-10-CM

## 2016-06-15 HISTORY — PX: TEE WITHOUT CARDIOVERSION: SHX5443

## 2016-06-15 LAB — GLUCOSE, CAPILLARY: Glucose-Capillary: 112 mg/dL — ABNORMAL HIGH (ref 65–99)

## 2016-06-15 SURGERY — ECHOCARDIOGRAM, TRANSESOPHAGEAL
Anesthesia: Moderate Sedation

## 2016-06-15 MED ORDER — FENTANYL CITRATE (PF) 100 MCG/2ML IJ SOLN
INTRAMUSCULAR | Status: DC | PRN
  Administered 2016-06-15: 25 ug via INTRAVENOUS

## 2016-06-15 MED ORDER — MIDAZOLAM HCL 10 MG/2ML IJ SOLN
INTRAMUSCULAR | Status: DC | PRN
  Administered 2016-06-15: 2 mg via INTRAVENOUS

## 2016-06-15 MED ORDER — MIDAZOLAM HCL 5 MG/ML IJ SOLN
INTRAMUSCULAR | Status: AC
  Filled 2016-06-15: qty 2

## 2016-06-15 MED ORDER — SODIUM CHLORIDE 0.9 % IV SOLN
INTRAVENOUS | Status: DC
  Administered 2016-06-15: 13:00:00 via INTRAVENOUS

## 2016-06-15 MED ORDER — BUTAMBEN-TETRACAINE-BENZOCAINE 2-2-14 % EX AERO
INHALATION_SPRAY | CUTANEOUS | Status: DC | PRN
  Administered 2016-06-15: 2 via TOPICAL

## 2016-06-15 MED ORDER — FENTANYL CITRATE (PF) 100 MCG/2ML IJ SOLN
INTRAMUSCULAR | Status: AC
  Filled 2016-06-15: qty 2

## 2016-06-15 NOTE — H&P (View-Only) (Signed)
Patient ID: Christian Bowman, male   DOB: 04/20/1946, 70 y.o.   MRN: [id number] Patient ID: Christian Bowman, male   DOB: July 27, 1946, 70 y.o.   MRN: [id number]    Cardiology Office Note    Date:  06/09/2016   ID:  Christian Bowman, DOB 15-Dec-1945, MRN [id number]  PCP:  Purvis Kilts, MD  Cardiologist:   Sanda Klein, MD   Chief Complaint  Patient presents with  . Follow-up    pt c/o chest pain and SOB getting worse at night time and high BP when this happens    History of Present Illness:  Christian Bowman is a 70 y.o. male with recurrent episodes of acute heart failure exacerbation on a background of ischemic cardiomyopathy, EF 35% and mitral insufficiency (estimated to be moderate by last echo in [place], probable ischemic mechanism).  He was last hospitalized in July. He had heart failure treated in [place]. He still has severe shortness of breath. Throughout the day he describes symptoms of NYHA class 2 dyspnea, but at night he has often slept sitting up with his head lying on a pillow on the dining room table due to severe orthopnea. No syncope or palpitations. He denies angina.   He is compliant with sodium restriction, but as before his wife is even more meticulous with diet management. His weight is lower and he appears to have lost some more true weight.  He brings in a detailed log of his home weights which have recently been 174-177 lb. Our office scale appears to overestimate his home weight by about 5-6 pounds. He is now at an optimal body mass index. Glycemic control remains good.  He is now 10 years status post CABG (Dr. Roxy Manns, January 2007, LIMA to LAD, SVG to ramus, SVG to PDA-PLA ) and his prior presentation was atypical , with dyspnea rather than angina. He had postoperative atrial fibrillation and underwent TEE cardioversion. He did not have mitral insufficiency at that time. Coronary disease was identified incidentally when he presented for renal artery stenting and was found to have  left main coronary artery stenosis.  In September 2016, he had pulmonary edema and was treated in [place], [place]. Echocardiography demonstrated left ventricular ejection fraction of 35% and moderate to severe mitral insufficiency. There was inferolateral akinesis. The left atrium was moderately enlarged. Diastolic dysfunction was described as "grade 1". Frequent PVCs were noted on telemetry. Coronary angiography was performed. There was total occlusion of native coronary arteries. The saphenous vein graft bypass to the OM was occluded. The saphenous vein graft bypass to the right coronary artery had a 90% stenosis which was treated with a drug-eluting stent (Xience alpine 3.25 x 15 mm placed on September 23 ) and the LIMA to the LAD was widely patent. Renal artery Dopplers did not show evidence of stenosis but there was an ill-defined mass in the left kidney measuring 2 x 3 cm, shown by MRI to be a cyst. Both kidneys were normal in size.   He also has type 2 diabetes mellitus and dyslipidemia with low HDL and elevated triglycerides. Historically, diabetes control has been erratic, much better now.  Past Medical History:  Diagnosis Date  . CAD (coronary artery disease) p CABG 2007 08/26/2013   LIMA to LAD, SVG-ramus, SVG to PDA - PLA  . DM2 (diabetes mellitus, type 2) ([place]) 08/26/2013  . Heart attack ([place])   . HTN (hypertension) 08/26/2013  . Hyperlipidemia 08/26/2013  . Left renal artery stenosis ([facility])  08/26/2013   Genesis 6x12 stent 2007  . Obesity (BMI 30.0-34.9) 08/26/2013  . Postoperative atrial fibrillation ([place]) 08/26/2013    Past Surgical History:  Procedure Laterality Date  . MYOCARDICAL PERFUSION  10/09/2007   NORMAL PERFUSION IN ALL REGIONS;NO EVIDENCE OF INDUCIBLE ISCHEMIA;POST STRESS EF% 66  . RENAL DOPPLER  03/28/2010   RIGHT RA-NORMAL;LEFT PROXIMAL RA AT STENT-PATENT WITH NO EVIDENCE OF SIGN DIAMETER REDUCTION. R & L KIDNEYS: EQUAL IN SIZE,SYMMETRICAL IN SHAPE.  [name]  TRANSESOPHAGEAL ECHOCARDIOGRAM  10/19/2005   NORMAL LV; MILD TO MODERATE AMOUNT OF SOFT ATHEROMATOUS PLAQUE OF THE THORACIC AORTA; THE LEFT ATRIUM IS MILDLY DILATED;LEFT ATRIAL APPENDAGE FUNCTION IS NORMAL;NO THROMBUS IDENTIFIED. SMALL PFO WITH RIGHT TO LEFT SHUNT    Outpatient Medications Prior to Visit  Medication Sig Dispense Refill  . amLODipine (NORVASC) 2.5 MG tablet Take 1 tablet (2.5 mg total) by mouth daily. 180 tablet 3  . aspirin 81 MG tablet Take 81 mg by mouth daily.    . carvedilol (COREG) 25 MG tablet Take 1 tablet (25 mg total) by mouth 2 (two) times daily. 180 tablet 3  . clopidogrel (PLAVIX) 75 MG tablet TAKE ONE TABLET BY MOUTH ONCE DAILY 90 tablet 3  . glucose blood (ACCU-CHEK AVIVA) test strip Use as instructed bid E11.65 100 each 5  . lisinopril (PRINIVIL,ZESTRIL) 40 MG tablet Take 1 tablet (40 mg total) by mouth daily. 90 tablet 3  . metFORMIN (GLUCOPHAGE) 500 MG tablet Take 1 tablet (500 mg total) by mouth 2 (two) times daily with a meal. 180 tablet 1  . rosuvastatin (CRESTOR) 10 MG tablet Take 1 tablet (10 mg total) by mouth daily. 90 tablet 3  . furosemide (LASIX) 40 MG tablet TAKE ONE TABLET BY MOUTH ONCE DAILY 90 tablet 3  . potassium chloride (K-DUR,KLOR-CON) 10 MEQ tablet Take 1 tablet (10 mEq total) by mouth 2 (two) times daily. 180 tablet 3   No facility-administered medications prior to visit.      Allergies:   Xanax [alprazolam]   Social History   Social History  . Marital status: Married    Spouse name: N/A  . Number of children: N/A  . Years of education: N/A   Social History Main Topics  . Smoking status: Never Smoker  . Smokeless tobacco: Never Used  . Alcohol use No  . Drug use: No  . Sexual activity: Not Currently   Other Topics Concern  . None   Social History Narrative  . None    ROS:   Please see the history of present illness.    ROS All other systems reviewed and are negative.   PHYSICAL EXAM:   VS:  BP 110/68 (BP Location:  Right Arm, Patient Position: Sitting, Cuff Size: Normal)   Pulse 62   Ht 6' (1.829 m)   Wt 180 lb 12.8 oz (82 kg)   SpO2 98%   BMI 24.52 kg/m    General: Alert, oriented x3, no distress Head: no evidence of trauma, PERRL, EOMI, no exophtalmos or lid lag, no myxedema, no xanthelasma; normal ears, nose and oropharynx. Soft fluid-filled mass in right submandibular area Neck: normal jugular venous pulsations and no hepatojugular reflux; brisk carotid pulses without delay and no carotid bruits Chest: clear to auscultation, no signs of consolidation by percussion or palpation, normal fremitus, symmetrical and full respiratory excursions Cardiovascular: normal position and quality of the apical impulse, regular rhythm, normal first and second heart sounds, Grade 3/6 holosystolic murmur radiating towards the axilla, no diastolic  murmurs, rubs or gallops Abdomen: no tenderness or distention, no masses by palpation, no abnormal pulsatility or arterial bruits, normal bowel sounds, no hepatosplenomegaly Extremities: no clubbing, cyanosis or edema; 2+ radial, ulnar and brachial pulses bilaterally; 2+ right femoral, posterior tibial and dorsalis pedis pulses; 2+ left femoral, posterior tibial and dorsalis pedis pulses; no subclavian or femoral bruits Neurological: grossly nonfocal Psych: euthymic mood, full affect   Wt Readings from Last 3 Encounters:  06/08/16 180 lb 12.8 oz (82 kg)  05/11/16 187 lb (84.8 kg)  04/28/16 180 lb 6.4 oz (81.8 kg)      Studies/Labs Reviewed:   EKG:  EKG is not ordered today.    Recent Labs: 04/20/2016: ALT 14; BUN 23; Creatinine, Ser 1.25; Potassium 4.1; Sodium 142  10/12/2015: TChol 122, TG 158, HDL 22, LDL 62   ASSESSMENT:    1. Chronic combined systolic and diastolic heart failure ([place])   2. Ischemic mitral regurgitation   3. Coronary artery disease involving native coronary artery of native heart without angina pectoris   4. Hyperlipidemia   5. Essential  hypertension   6. Type 2 diabetes mellitus with vascular disease ([place])   7. Left renal artery stenosis (HCC)      PLAN:  In order of problems listed above:  1. CHF: appears to be euvolemic by physical exam, but has symptoms strongly suggestive of high left atrial filling pressures. It is likely that the mitral insufficiency may be the biggest problem. On high doses of ACEi and beta blocker. Will increase his diuretic dose. Would also like to enroll him in the heart failure clinic, which might help [place] avoid repeat hospitalizations if he can give him urgent intravenous diuretics, also to discuss advanced therapies should he not be a candidate for mitral valve surgical repair. 2. MR: I suspect this is playing an important role and I am worried that the MR severity may be underestimated. Will need a TEE to see if he is a candidate for mitral clip or surgical mitral valve repair. We discussed in the past, he declined further evaluation since he did not want any invasive procedures.  His experience with CABG and the lengthy recovery is discouraging to him. His symptoms have now worsened to the degree that he "just doesn't want to live like this" and is willing to consider repeat surgery. We'll arrange a consultation with Dr. Roxy Manns. 3. CAD:  Angina free. Last evaluation was the cardiac catheterization performed in [place] when he received a stent in the SVG to RCA. All the native coronaries were occluded, SVG to OM was occluded, but the arterial bypasses to the LAD and ramus were patent. If he is felt to be a surgical candidate will have to have a repeat coronary angiogram.  4. HLP:  Excellent LDL, very low HDL. On statin. Suspect his HDL might have improved since he has lost some atrial weight 5. HTN: Well controlled 6. DM: Good glycemic control  7. Hx of Renal artery stenosis:  No stenosis by Duplex US in [place] Sept 2016. Creat 1.48.    Medication Adjustments/Labs and Tests Ordered: Current  medicines are reviewed at length with the patient today.  Concerns regarding medicines are outlined above.  Medication changes, Labs and Tests ordered today are listed in the Patient Instructions below. Patient Instructions  Dr Sallyanne Kuster has recommended making the following medication changes: 1. INCREASE Furosemide to 40 mg TWICE daily 2. INCREASE Potassium to 20 mEq TWICE daily  You have been referred to Dr  Remus Loffler at [facility] for mitral regurgitation.  You have been referred to the heart failure clinic.  Dr Sallyanne Kuster recommends that you schedule a follow-up appointment in 3 months.  If you need a refill on your cardiac medications before your next appointment, please call your pharmacy.      Signed, Sanda Klein, MD  06/09/2016 1:06 PM    [facility] [address], [place], [place]  [address] Phone: [phone]; Fax: [phone]

## 2016-06-15 NOTE — Op Note (Signed)
INDICATIONS: mitral insufficiency.  PROCEDURE:   Informed consent was obtained prior to the procedure. The risks, benefits and alternatives for the procedure were discussed and the patient comprehended these risks.  Risks include, but are not limited to, cough, sore throat, vomiting, nausea, somnolence, esophageal and stomach trauma or perforation, bleeding, low blood pressure, aspiration, pneumonia, infection, trauma to the teeth and death.    After a procedural time-out, the oropharynx was anesthetized with 20% benzocaine spray.   During this procedure the patient was administered a total of Versed 2 mg and Fentanyl 25 mcg to achieve and maintain moderate conscious sedation.  The patient's heart rate, blood pressure, and oxygen saturationweare monitored continuously during the procedure. The period of conscious sedation was 20 minutes, of which I was present face-to-face 100% of this time.  The transesophageal probe was inserted in the esophagus and stomach without difficulty and multiple views were obtained.  The patient was kept under observation until the patient left the procedure room.  The patient left the procedure room in stable condition.   Agitated microbubble saline contrast was not administered.  COMPLICATIONS:    There were no immediate complications.  FINDINGS:  Severe mitral insufficiency, eccentric posteriorly directed, originating at P3-A3 commissure.  There is systolic flow reversal in the right upper pulmonary vein. PISA calculated ERO may be underestimated due to the highly eccentric jet. Severely depressed, dilated left ventricle with extensive inferior and inferolateral hypokinesis. Moderate atherosclerosis in the descending aorta.  RECOMMENDATIONS:     Consider surgical repair versus mitraclip.  Time Spent Directly with the Patient:  40 minutes   Christian Bowman 06/15/2016, 2:15 PM

## 2016-06-15 NOTE — Telephone Encounter (Signed)
Attempt to return call-spoke to wife (ok per DPR).  Wife reports they spoke with MD Croitoru today and all questions have been answered.  Denies further questions/concerns at this time.

## 2016-06-15 NOTE — Discharge Instructions (Signed)
YOU HAD AN CARDIAC PROCEDURE TODAY: Refer to the procedure report and other information in the discharge instructions given to you for any specific questions about what was found during the examination. If this information does not answer your questions, please call [facility] office at [phone] to clarify.   DIET: Your first meal following the procedure should be a light meal and then it is ok to progress to your normal diet. A half-sandwich or bowl of soup is an example of a good first meal. Heavy or fried foods are harder to digest and may make you feel nauseous or bloated. Drink plenty of fluids but you should avoid alcoholic beverages for 24 hours. If you had a esophageal dilation, please see attached instructions for diet.   ACTIVITY: Your care partner should take you home directly after the procedure. You should plan to take it easy, moving slowly for the rest of the day. You can resume normal activity the day after the procedure however YOU SHOULD NOT DRIVE, use power tools, machinery or perform tasks that involve climbing or major physical exertion for 24 hours (because of the sedation medicines used during the test).   SYMPTOMS TO REPORT IMMEDIATELY: A cardiologist can be reached at any hour. Please call [phone] for any of the following symptoms:  Vomiting of blood or coffee ground material  New, significant abdominal pain  New, significant chest pain or pain under the shoulder blades  Painful or persistently difficult swallowing  New shortness of breath  Black, tarry-looking or red, bloody stools  FOLLOW UP:  Please also call with any specific questions about appointments or follow up tests.  DR. Burt Knack, cardiac catherization on Wednesday, September 20th at [facility], arrive at 0930, nothing by mouth after midnight

## 2016-06-15 NOTE — Interval H&P Note (Signed)
History and Physical Interval Note:  06/15/2016 1:15 PM  Christian Bowman  has presented today for surgery, with the diagnosis of MITRAL REGERGITATION  The various methods of treatment have been discussed with the patient and family. After consideration of risks, benefits and other options for treatment, the patient has consented to  Procedure(s): TRANSESOPHAGEAL ECHOCARDIOGRAM (TEE) (N/A) as a surgical intervention .  The patient's history has been reviewed, patient examined, no change in status, stable for surgery.  I have reviewed the patient's chart and labs.  Questions were answered to the patient's satisfaction.     Hiram Mciver

## 2016-06-16 ENCOUNTER — Other Ambulatory Visit: Payer: Self-pay | Admitting: "Endocrinology

## 2016-06-16 MED ORDER — METFORMIN HCL 500 MG PO TABS: 500.0000 mg | ORAL_TABLET | Freq: Two times a day (BID) | ORAL | 1 refills | Status: DC

## 2016-06-19 ENCOUNTER — Encounter: Payer: Medicare Other | Admitting: Thoracic Surgery (Cardiothoracic Vascular Surgery)

## 2016-06-19 ENCOUNTER — Encounter: Payer: Self-pay | Admitting: Thoracic Surgery (Cardiothoracic Vascular Surgery)

## 2016-06-19 DIAGNOSIS — I2510 Atherosclerotic heart disease of native coronary artery without angina pectoris: Secondary | ICD-10-CM | POA: Insufficient documentation

## 2016-06-19 DIAGNOSIS — I25118 Atherosclerotic heart disease of native coronary artery with other forms of angina pectoris: Secondary | ICD-10-CM | POA: Insufficient documentation

## 2016-06-19 DIAGNOSIS — I25810 Atherosclerosis of coronary artery bypass graft(s) without angina pectoris: Secondary | ICD-10-CM | POA: Insufficient documentation

## 2016-06-19 NOTE — Progress Notes (Signed)
This encounter was created in error - please disregard.

## 2016-06-21 ENCOUNTER — Encounter (HOSPITAL_COMMUNITY)
Admission: RE | Disposition: A | Discharge status: Home / Self Care | Payer: Self-pay | Source: Ambulatory Visit | Attending: Cardiovascular Disease

## 2016-06-21 ENCOUNTER — Ambulatory Visit (HOSPITAL_COMMUNITY)
Admission: RE | Admit: 2016-06-21 | Discharge: 2016-06-21 | Disposition: A | Discharge status: Home / Self Care | Payer: Medicare Other | Source: Ambulatory Visit | Attending: Cardiovascular Disease | Admitting: Cardiovascular Disease

## 2016-06-21 ENCOUNTER — Telehealth: Payer: Self-pay | Admitting: Cardiovascular Disease

## 2016-06-21 DIAGNOSIS — E669 Obesity, unspecified: Secondary | ICD-10-CM | POA: Insufficient documentation

## 2016-06-21 DIAGNOSIS — Z6824 Body mass index (BMI) 24.0-24.9, adult: Secondary | ICD-10-CM | POA: Diagnosis not present

## 2016-06-21 DIAGNOSIS — Z7984 Long term (current) use of oral hypoglycemic drugs: Secondary | ICD-10-CM | POA: Insufficient documentation

## 2016-06-21 DIAGNOSIS — E1151 Type 2 diabetes mellitus with diabetic peripheral angiopathy without gangrene: Secondary | ICD-10-CM | POA: Diagnosis not present

## 2016-06-21 DIAGNOSIS — I340 Nonrheumatic mitral (valve) insufficiency: Secondary | ICD-10-CM | POA: Diagnosis present

## 2016-06-21 DIAGNOSIS — I110 Hypertensive heart disease with heart failure: Secondary | ICD-10-CM | POA: Insufficient documentation

## 2016-06-21 DIAGNOSIS — I5042 Chronic combined systolic (congestive) and diastolic (congestive) heart failure: Secondary | ICD-10-CM | POA: Insufficient documentation

## 2016-06-21 DIAGNOSIS — I25810 Atherosclerosis of coronary artery bypass graft(s) without angina pectoris: Secondary | ICD-10-CM | POA: Diagnosis not present

## 2016-06-21 DIAGNOSIS — I2582 Chronic total occlusion of coronary artery: Secondary | ICD-10-CM | POA: Insufficient documentation

## 2016-06-21 DIAGNOSIS — I255 Ischemic cardiomyopathy: Secondary | ICD-10-CM | POA: Insufficient documentation

## 2016-06-21 DIAGNOSIS — Z7982 Long term (current) use of aspirin: Secondary | ICD-10-CM | POA: Insufficient documentation

## 2016-06-21 DIAGNOSIS — E785 Hyperlipidemia, unspecified: Secondary | ICD-10-CM | POA: Diagnosis not present

## 2016-06-21 DIAGNOSIS — I252 Old myocardial infarction: Secondary | ICD-10-CM | POA: Insufficient documentation

## 2016-06-21 DIAGNOSIS — I2510 Atherosclerotic heart disease of native coronary artery without angina pectoris: Secondary | ICD-10-CM

## 2016-06-21 DIAGNOSIS — Z7902 Long term (current) use of antithrombotics/antiplatelets: Secondary | ICD-10-CM | POA: Insufficient documentation

## 2016-06-21 HISTORY — PX: CARDIAC CATHETERIZATION: SHX172

## 2016-06-21 LAB — POCT I-STAT 3, VENOUS BLOOD GAS (G3P V)
ACID-BASE EXCESS: 2 mmol/L (ref 0.0–2.0)
BICARBONATE: 27.8 mmol/L (ref 20.0–28.0)
O2 SAT: 57 %
TCO2: 29 mmol/L (ref 0–100)
pCO2, Ven: 46.3 mmHg (ref 44.0–60.0)
pH, Ven: 7.386 (ref 7.250–7.430)
pO2, Ven: 31 mmHg — CL (ref 32.0–45.0)

## 2016-06-21 LAB — CBC
HEMATOCRIT: 35.7 % — AB (ref 39.0–52.0)
Hemoglobin: 11 g/dL — ABNORMAL LOW (ref 13.0–17.0)
MCH: 27.7 pg (ref 26.0–34.0)
MCHC: 30.8 g/dL (ref 30.0–36.0)
MCV: 89.9 fL (ref 78.0–100.0)
Platelets: 161 10*3/uL (ref 150–400)
RBC: 3.97 MIL/uL — ABNORMAL LOW (ref 4.22–5.81)
RDW: 13.6 % (ref 11.5–15.5)
WBC: 7.2 10*3/uL (ref 4.0–10.5)

## 2016-06-21 LAB — POCT I-STAT 3, ART BLOOD GAS (G3+)
Acid-Base Excess: 1 mmol/L (ref 0.0–2.0)
Bicarbonate: 26 mmol/L (ref 20.0–28.0)
O2 Saturation: 93 %
PH ART: 7.416 (ref 7.350–7.450)
TCO2: 27 mmol/L (ref 0–100)
pCO2 arterial: 40.3 mmHg (ref 32.0–48.0)
pO2, Arterial: 65 mmHg — ABNORMAL LOW (ref 83.0–108.0)

## 2016-06-21 LAB — BASIC METABOLIC PANEL
Anion gap: 5 (ref 5–15)
BUN: 19 mg/dL (ref 6–20)
CHLORIDE: 106 mmol/L (ref 101–111)
CO2: 30 mmol/L (ref 22–32)
Calcium: 9.1 mg/dL (ref 8.9–10.3)
Creatinine, Ser: 1.35 mg/dL — ABNORMAL HIGH (ref 0.61–1.24)
GFR calc Af Amer: >60 mL/min (ref >60)
GFR calc non Af Amer: 52 mL/min — ABNORMAL LOW (ref >60)
Glucose, Bld: 151 mg/dL — ABNORMAL HIGH (ref 65–99)
POTASSIUM: 3.9 mmol/L (ref 3.5–5.1)
SODIUM: 141 mmol/L (ref 135–145)

## 2016-06-21 LAB — PROTIME-INR
INR: 1.16
Prothrombin Time: 14.9 seconds (ref 11.4–15.2)

## 2016-06-21 LAB — TSH: TSH: 3.729 u[IU]/mL (ref 0.350–4.500)

## 2016-06-21 LAB — GLUCOSE, CAPILLARY: Glucose-Capillary: 140 mg/dL — ABNORMAL HIGH (ref 65–99)

## 2016-06-21 LAB — APTT: aPTT: 38 seconds — ABNORMAL HIGH (ref 24–36)

## 2016-06-21 SURGERY — RIGHT/LEFT HEART CATH AND CORONARY/GRAFT ANGIOGRAPHY
Anesthesia: LOCAL

## 2016-06-21 MED ORDER — VERAPAMIL HCL 2.5 MG/ML IV SOLN
INTRAVENOUS | Status: AC
  Filled 2016-06-21: qty 2

## 2016-06-21 MED ORDER — LABETALOL HCL 5 MG/ML IV SOLN
INTRAVENOUS | Status: AC
  Filled 2016-06-21: qty 4

## 2016-06-21 MED ORDER — ASPIRIN 81 MG PO CHEW
CHEWABLE_TABLET | ORAL | Status: AC
  Filled 2016-06-21: qty 1

## 2016-06-21 MED ORDER — HEPARIN SODIUM (PORCINE) 1000 UNIT/ML IJ SOLN
INTRAMUSCULAR | Status: DC | PRN
  Administered 2016-06-21: 4000 [IU] via INTRAVENOUS

## 2016-06-21 MED ORDER — SODIUM CHLORIDE 0.9% FLUSH: 3.0000 mL | INTRAVENOUS | Status: DC | PRN

## 2016-06-21 MED ORDER — SODIUM CHLORIDE 0.9% FLUSH: 3.0000 mL | Freq: Two times a day (BID) | INTRAVENOUS | Status: DC

## 2016-06-21 MED ORDER — MIDAZOLAM HCL 2 MG/2ML IJ SOLN
INTRAMUSCULAR | Status: DC | PRN
  Administered 2016-06-21: 2 mg via INTRAVENOUS

## 2016-06-21 MED ORDER — IOPAMIDOL (ISOVUE-370) INJECTION 76%
INTRAVENOUS | Status: AC
  Filled 2016-06-21: qty 100

## 2016-06-21 MED ORDER — MIDAZOLAM HCL 2 MG/2ML IJ SOLN
INTRAMUSCULAR | Status: AC
  Filled 2016-06-21: qty 2

## 2016-06-21 MED ORDER — VERAPAMIL HCL 2.5 MG/ML IV SOLN
INTRAVENOUS | Status: DC | PRN
  Administered 2016-06-21: 10 mL via INTRA_ARTERIAL

## 2016-06-21 MED ORDER — LABETALOL HCL 5 MG/ML IV SOLN
INTRAVENOUS | Status: DC | PRN
  Administered 2016-06-21: 10 mg via INTRAVENOUS

## 2016-06-21 MED ORDER — SODIUM CHLORIDE 0.9 % IV SOLN: 250.0000 mL | INTRAVENOUS | Status: DC | PRN

## 2016-06-21 MED ORDER — LIDOCAINE HCL (PF) 1 % IJ SOLN
INTRAMUSCULAR | Status: DC | PRN
  Administered 2016-06-21 (×2): 2 mL

## 2016-06-21 MED ORDER — FENTANYL CITRATE (PF) 100 MCG/2ML IJ SOLN
INTRAMUSCULAR | Status: AC
  Filled 2016-06-21: qty 2

## 2016-06-21 MED ORDER — HEPARIN (PORCINE) IN NACL 2-0.9 UNIT/ML-% IJ SOLN
INTRAMUSCULAR | Status: AC
  Filled 2016-06-21: qty 1500

## 2016-06-21 MED ORDER — IOPAMIDOL (ISOVUE-370) INJECTION 76%
INTRAVENOUS | Status: DC | PRN
  Administered 2016-06-21: 80 mL via INTRA_ARTERIAL

## 2016-06-21 MED ORDER — SODIUM CHLORIDE 0.9 % WEIGHT BASED INFUSION: 1.0000 mL/kg/h | INTRAVENOUS | Status: DC

## 2016-06-21 MED ORDER — ONDANSETRON HCL 4 MG/2ML IJ SOLN: 4.0000 mg | Freq: Four times a day (QID) | INTRAMUSCULAR | Status: DC | PRN

## 2016-06-21 MED ORDER — HEPARIN (PORCINE) IN NACL 2-0.9 UNIT/ML-% IJ SOLN
INTRAMUSCULAR | Status: DC | PRN
  Administered 2016-06-21: 1500 mL

## 2016-06-21 MED ORDER — ACETAMINOPHEN 325 MG PO TABS: 650.0000 mg | ORAL_TABLET | ORAL | Status: DC | PRN

## 2016-06-21 MED ORDER — SODIUM CHLORIDE 0.9% FLUSH
3.0000 mL | INTRAVENOUS | Status: DC | PRN
Start: 2016-06-21 — End: 2016-06-21

## 2016-06-21 MED ORDER — FENTANYL CITRATE (PF) 100 MCG/2ML IJ SOLN
INTRAMUSCULAR | Status: DC | PRN
  Administered 2016-06-21: 25 ug via INTRAVENOUS

## 2016-06-21 MED ORDER — LIDOCAINE HCL (PF) 1 % IJ SOLN
INTRAMUSCULAR | Status: AC
  Filled 2016-06-21: qty 30

## 2016-06-21 MED ORDER — SODIUM CHLORIDE 0.9 % WEIGHT BASED INFUSION: 3.0000 mL/kg/h | INTRAVENOUS | Status: DC

## 2016-06-21 MED ORDER — ASPIRIN 81 MG PO CHEW
81.0000 mg | CHEWABLE_TABLET | ORAL | Status: AC
  Administered 2016-06-21: 81 mg via ORAL

## 2016-06-21 SURGICAL SUPPLY — 12 items
CATH BALLN WEDGE 5F 110CM (CATHETERS) ×1 IMPLANT
CATH IMPULSE 5F ANG/FL3.5 (CATHETERS) ×1 IMPLANT
CATH INFINITI 5 FR AL2 (CATHETERS) ×1 IMPLANT
CATH INFINITI 5 FR MPA2 (CATHETERS) ×2 IMPLANT
DEVICE RAD COMP TR BAND LRG (VASCULAR PRODUCTS) ×1 IMPLANT
GLIDESHEATH SLEND SS 6F .021 (SHEATH) ×2 IMPLANT
KIT HEART LEFT (KITS) ×2 IMPLANT
PACK CARDIAC CATHETERIZATION (CUSTOM PROCEDURE TRAY) ×2 IMPLANT
SHEATH FAST CATH BRACH 5F 5CM (SHEATH) ×2 IMPLANT
TRANSDUCER W/STOPCOCK (MISCELLANEOUS) ×3 IMPLANT
TUBING CIL FLEX 10 FLL-RA (TUBING) ×2 IMPLANT
WIRE SAFE-T 1.5MM-J .035X260CM (WIRE) ×1 IMPLANT

## 2016-06-21 NOTE — Telephone Encounter (Signed)
New message      Pt is having a cath this am at 9:30.  Calling to see if he can take his medications

## 2016-06-21 NOTE — Telephone Encounter (Signed)
Pt given instruction on medications to hold for cath procedure today (metformin), advised to take all other scheduled medications. Caller voiced understanding and thanks.

## 2016-06-21 NOTE — H&P (View-Only) (Signed)
Patient ID: Christian Bowman, male   DOB: 1946/04/26, 70 y.o.   MRN: [id number] Patient ID: Christian Bowman, male   DOB: [birth year], 69 y.o.   MRN: [id number]    Cardiology Office Note    Date:  06/09/2016   ID:  Christian Bowman, DOB [birth year], MRN [id number]  PCP:  Purvis Kilts, MD  Cardiologist:   Sanda Klein, MD   Chief Complaint  Patient presents with  . Follow-up    pt c/o chest pain and SOB getting worse at night time and high BP when this happens    History of Present Illness:  Christian Bowman is a 69 y.o. male with recurrent episodes of acute heart failure exacerbation on a background of ischemic cardiomyopathy, EF 35% and mitral insufficiency (estimated to be moderate by last echo in [place], probable ischemic mechanism).  He was last hospitalized in July. He had heart failure treated in [place]. He still has severe shortness of breath. Throughout the day he describes symptoms of NYHA class 2 dyspnea, but at night he has often slept sitting up with his head lying on a pillow on the dining room table due to severe orthopnea. No syncope or palpitations. He denies angina.   He is compliant with sodium restriction, but as before his wife is even more meticulous with diet management. His weight is lower and he appears to have lost some more true weight.  He brings in a detailed log of his home weights which have recently been 174-177 lb. Our office scale appears to overestimate his home weight by about 5-6 pounds. He is now at an optimal body mass index. Glycemic control remains good.  He is now 10 years status post CABG (Dr. Roxy Manns, January 2007, LIMA to LAD, SVG to ramus, SVG to PDA-PLA ) and his prior presentation was atypical , with dyspnea rather than angina. He had postoperative atrial fibrillation and underwent TEE cardioversion. He did not have mitral insufficiency at that time. Coronary disease was identified incidentally when he presented for renal artery stenting and was found to have  left main coronary artery stenosis.  In September 2016, he had pulmonary edema and was treated in [place], [place]. Echocardiography demonstrated left ventricular ejection fraction of 35% and moderate to severe mitral insufficiency. There was inferolateral akinesis. The left atrium was moderately enlarged. Diastolic dysfunction was described as "grade 1". Frequent PVCs were noted on telemetry. Coronary angiography was performed. There was total occlusion of native coronary arteries. The saphenous vein graft bypass to the OM was occluded. The saphenous vein graft bypass to the right coronary artery had a 90% stenosis which was treated with a drug-eluting stent (Xience alpine 3.25 x 15 mm placed on September 23 ) and the LIMA to the LAD was widely patent. Renal artery Dopplers did not show evidence of stenosis but there was an ill-defined mass in the left kidney measuring 2 x 3 cm, shown by MRI to be a cyst. Both kidneys were normal in size.   He also has type 2 diabetes mellitus and dyslipidemia with low HDL and elevated triglycerides. Historically, diabetes control has been erratic, much better now.  Past Medical History:  Diagnosis Date  . CAD (coronary artery disease) p CABG 2007 08/26/2013   LIMA to LAD, SVG-ramus, SVG to PDA - PLA  . DM2 (diabetes mellitus, type 2) ([place]) 08/26/2013  . Heart attack ([place])   . HTN (hypertension) 08/26/2013  . Hyperlipidemia 08/26/2013  . Left renal artery stenosis ([facility])  08/26/2013   Genesis 6x12 stent 2007  . Obesity (BMI 30.0-34.9) 08/26/2013  . Postoperative atrial fibrillation ([place]) 08/26/2013    Past Surgical History:  Procedure Laterality Date  . MYOCARDICAL PERFUSION  10/09/2007   NORMAL PERFUSION IN ALL REGIONS;NO EVIDENCE OF INDUCIBLE ISCHEMIA;POST STRESS EF% 66  . RENAL DOPPLER  03/28/2010   RIGHT RA-NORMAL;LEFT PROXIMAL RA AT STENT-PATENT WITH NO EVIDENCE OF SIGN DIAMETER REDUCTION. R & L KIDNEYS: EQUAL IN SIZE,SYMMETRICAL IN SHAPE.  [name]  TRANSESOPHAGEAL ECHOCARDIOGRAM  10/19/2005   NORMAL LV; MILD TO MODERATE AMOUNT OF SOFT ATHEROMATOUS PLAQUE OF THE THORACIC AORTA; THE LEFT ATRIUM IS MILDLY DILATED;LEFT ATRIAL APPENDAGE FUNCTION IS NORMAL;NO THROMBUS IDENTIFIED. SMALL PFO WITH RIGHT TO LEFT SHUNT    Outpatient Medications Prior to Visit  Medication Sig Dispense Refill  . amLODipine (NORVASC) 2.5 MG tablet Take 1 tablet (2.5 mg total) by mouth daily. 180 tablet 3  . aspirin 81 MG tablet Take 81 mg by mouth daily.    . carvedilol (COREG) 25 MG tablet Take 1 tablet (25 mg total) by mouth 2 (two) times daily. 180 tablet 3  . clopidogrel (PLAVIX) 75 MG tablet TAKE ONE TABLET BY MOUTH ONCE DAILY 90 tablet 3  . glucose blood (ACCU-CHEK AVIVA) test strip Use as instructed bid E11.65 100 each 5  . lisinopril (PRINIVIL,ZESTRIL) 40 MG tablet Take 1 tablet (40 mg total) by mouth daily. 90 tablet 3  . metFORMIN (GLUCOPHAGE) 500 MG tablet Take 1 tablet (500 mg total) by mouth 2 (two) times daily with a meal. 180 tablet 1  . rosuvastatin (CRESTOR) 10 MG tablet Take 1 tablet (10 mg total) by mouth daily. 90 tablet 3  . furosemide (LASIX) 40 MG tablet TAKE ONE TABLET BY MOUTH ONCE DAILY 90 tablet 3  . potassium chloride (K-DUR,KLOR-CON) 10 MEQ tablet Take 1 tablet (10 mEq total) by mouth 2 (two) times daily. 180 tablet 3   No facility-administered medications prior to visit.      Allergies:   Xanax [alprazolam]   Social History   Social History  . Marital status: Married    Spouse name: N/A  . Number of children: N/A  . Years of education: N/A   Social History Main Topics  . Smoking status: Never Smoker  . Smokeless tobacco: Never Used  . Alcohol use No  . Drug use: No  . Sexual activity: Not Currently   Other Topics Concern  . None   Social History Narrative  . None    ROS:   Please see the history of present illness.    ROS All other systems reviewed and are negative.   PHYSICAL EXAM:   VS:  BP 110/68 (BP Location:  Right Arm, Patient Position: Sitting, Cuff Size: Normal)   Pulse 62   Ht 6' (1.829 m)   Wt 180 lb 12.8 oz (82 kg)   SpO2 98%   BMI 24.52 kg/m    General: Alert, oriented x3, no distress Head: no evidence of trauma, PERRL, EOMI, no exophtalmos or lid lag, no myxedema, no xanthelasma; normal ears, nose and oropharynx. Soft fluid-filled mass in right submandibular area Neck: normal jugular venous pulsations and no hepatojugular reflux; brisk carotid pulses without delay and no carotid bruits Chest: clear to auscultation, no signs of consolidation by percussion or palpation, normal fremitus, symmetrical and full respiratory excursions Cardiovascular: normal position and quality of the apical impulse, regular rhythm, normal first and second heart sounds, Grade 3/6 holosystolic murmur radiating towards the axilla, no diastolic  murmurs, rubs or gallops Abdomen: no tenderness or distention, no masses by palpation, no abnormal pulsatility or arterial bruits, normal bowel sounds, no hepatosplenomegaly Extremities: no clubbing, cyanosis or edema; 2+ radial, ulnar and brachial pulses bilaterally; 2+ right femoral, posterior tibial and dorsalis pedis pulses; 2+ left femoral, posterior tibial and dorsalis pedis pulses; no subclavian or femoral bruits Neurological: grossly nonfocal Psych: euthymic mood, full affect   Wt Readings from Last 3 Encounters:  06/08/16 180 lb 12.8 oz (82 kg)  05/11/16 187 lb (84.8 kg)  04/28/16 180 lb 6.4 oz (81.8 kg)      Studies/Labs Reviewed:   EKG:  EKG is not ordered today.    Recent Labs: 04/20/2016: ALT 14; BUN 23; Creatinine, Ser 1.25; Potassium 4.1; Sodium 142  10/12/2015: TChol 122, TG 158, HDL 22, LDL 62   ASSESSMENT:    1. Chronic combined systolic and diastolic heart failure ([place])   2. Ischemic mitral regurgitation   3. Coronary artery disease involving native coronary artery of native heart without angina pectoris   4. Hyperlipidemia   5. Essential  hypertension   6. Type 2 diabetes mellitus with vascular disease ([place])   7. Left renal artery stenosis ([facility])      PLAN:  In order of problems listed above:  1. CHF: appears to be euvolemic by physical exam, but has symptoms strongly suggestive of high left atrial filling pressures. It is likely that the mitral insufficiency may be the biggest problem. On high doses of ACEi and beta blocker. Will increase his diuretic dose. Would also like to enroll him in the heart failure clinic, which might help [place] avoid repeat hospitalizations if he can give him urgent intravenous diuretics, also to discuss advanced therapies should he not be a candidate for mitral valve surgical repair. 2. MR: I suspect this is playing an important role and I am worried that the MR severity may be underestimated. Will need a TEE to see if he is a candidate for mitral clip or surgical mitral valve repair. We discussed in the past, he declined further evaluation since he did not want any invasive procedures.  His experience with CABG and the lengthy recovery is discouraging to him. His symptoms have now worsened to the degree that he "just doesn't want to live like this" and is willing to consider repeat surgery. We'll arrange a consultation with Dr. Roxy Manns. 3. CAD:  Angina free. Last evaluation was the cardiac catheterization performed in [place] when he received a stent in the SVG to RCA. All the native coronaries were occluded, SVG to OM was occluded, but the arterial bypasses to the LAD and ramus were patent. If he is felt to be a surgical candidate will have to have a repeat coronary angiogram.  4. HLP:  Excellent LDL, very low HDL. On statin. Suspect his HDL might have improved since he has lost some atrial weight 5. HTN: Well controlled 6. DM: Good glycemic control  7. Hx of Renal artery stenosis:  No stenosis by Duplex US in [place] Sept 2016. Creat 1.48.    Medication Adjustments/Labs and Tests Ordered: Current  medicines are reviewed at length with the patient today.  Concerns regarding medicines are outlined above.  Medication changes, Labs and Tests ordered today are listed in the Patient Instructions below. Patient Instructions  Dr Sallyanne Kuster has recommended making the following medication changes: 1. INCREASE Furosemide to 40 mg TWICE daily 2. INCREASE Potassium to 20 mEq TWICE daily  You have been referred to Dr  Remus Loffler at [facility] for mitral regurgitation.  You have been referred to the heart failure clinic.  Dr Sallyanne Kuster recommends that you schedule a follow-up appointment in 3 months.  If you need a refill on your cardiac medications before your next appointment, please call your pharmacy.      Signed, Sanda Klein, MD  06/09/2016 1:06 PM    [facility] [address], [place], [place]  [address] Phone: [phone]; Fax: [phone]

## 2016-06-21 NOTE — Discharge Instructions (Signed)
Radial Site Care °Refer to this sheet in the next few weeks. These instructions provide you with information about caring for yourself after your procedure. Your health care provider may also give you more specific instructions. Your treatment has been planned according to current medical practices, but problems sometimes occur. Call your health care provider if you have any problems or questions after your procedure. °WHAT TO EXPECT AFTER THE PROCEDURE °After your procedure, it is typical to have the following: °· Bruising at the radial site that usually fades within 1-2 weeks. °· Blood collecting in the tissue (hematoma) that may be painful to the touch. It should usually decrease in size and tenderness within 1-2 weeks. °HOME CARE INSTRUCTIONS °· Take medicines only as directed by your health care provider. °· You may shower 24-48 hours after the procedure or as directed by your health care provider. Remove the bandage (dressing) and gently wash the site with plain soap and water. Pat the area dry with a clean towel. Do not rub the site, because this may cause bleeding. °· Do not take baths, swim, or use a hot tub until your health care provider approves. °· Check your insertion site every day for redness, swelling, or drainage. °· Do not apply powder or lotion to the site. °· Do not flex or bend the affected arm for 24 hours or as directed by your health care provider. °· Do not push or pull heavy objects with the affected arm for 24 hours or as directed by your health care provider. °· Do not lift over 10 lb (4.5 kg) for 5 days after your procedure or as directed by your health care provider. °· Ask your health care provider when it is okay to: °¨ Return to work or school. °¨ Resume usual physical activities or sports. °¨ Resume sexual activity. °· Do not drive home if you are discharged the same day as the procedure. Have someone else drive you. °· You may drive 24 hours after the procedure unless otherwise  instructed by your health care provider. °· Do not operate machinery or power tools for 24 hours after the procedure. °· If your procedure was done as an outpatient procedure, which means that you went home the same day as your procedure, a responsible adult should be with you for the first 24 hours after you arrive home. °· Keep all follow-up visits as directed by your health care provider. This is important. °SEEK MEDICAL CARE IF: °· You have a fever. °· You have chills. °· You have increased bleeding from the radial site. Hold pressure on the site. CALL 911 °SEEK IMMEDIATE MEDICAL CARE IF: °· You have unusual pain at the radial site. °· You have redness, warmth, or swelling at the radial site. °· You have drainage (other than a small amount of blood on the dressing) from the radial site. °· The radial site is bleeding, and the bleeding does not stop after 30 minutes of holding steady pressure on the site. °· Your arm or hand becomes pale, cool, tingly, or numb. °  °This information is not intended to replace advice given to you by your health care provider. Make sure you discuss any questions you have with your health care provider. °  °Document Released: 10/21/2010 Document Revised: 10/09/2014 Document Reviewed: 04/06/2014 °Elsevier Interactive Patient Education ©2016 [employer] ° °

## 2016-06-21 NOTE — Progress Notes (Signed)
Pt with Hx CHF presents today for heart cath, normal saline at 10cc/hr

## 2016-06-21 NOTE — Interval H&P Note (Signed)
History and Physical Interval Note:  06/21/2016 12:00 PM  Christian Bowman  has presented today for surgery, with the diagnosis of mitrial regurgetation  The various methods of treatment have been discussed with the patient and family. After consideration of risks, benefits and other options for treatment, the patient has consented to  Procedure(s): Right/Left Heart Cath and Coronary/Graft Angiography (N/A) as a surgical intervention .  The patient's history has been reviewed, patient examined, no change in status, stable for surgery.  I have reviewed the patient's chart and labs.  Questions were answered to the patient's satisfaction.     Sherren Mocha

## 2016-06-22 ENCOUNTER — Encounter (HOSPITAL_COMMUNITY): Payer: Self-pay | Admitting: Cardiovascular Disease

## 2016-06-22 ENCOUNTER — Other Ambulatory Visit: Payer: Self-pay

## 2016-06-22 ENCOUNTER — Institutional Professional Consult (permissible substitution) (INDEPENDENT_AMBULATORY_CARE_PROVIDER_SITE_OTHER): Payer: Medicare Other | Admitting: Thoracic Surgery (Cardiothoracic Vascular Surgery)

## 2016-06-22 VITALS — BP 128/78 | HR 66 | Resp 16 | Ht 72.0 in | Wt 178.0 lb

## 2016-06-22 DIAGNOSIS — I5042 Chronic combined systolic (congestive) and diastolic (congestive) heart failure: Secondary | ICD-10-CM

## 2016-06-22 DIAGNOSIS — Z951 Presence of aortocoronary bypass graft: Secondary | ICD-10-CM

## 2016-06-22 DIAGNOSIS — I2510 Atherosclerotic heart disease of native coronary artery without angina pectoris: Secondary | ICD-10-CM

## 2016-06-22 DIAGNOSIS — I340 Nonrheumatic mitral (valve) insufficiency: Secondary | ICD-10-CM

## 2016-06-22 DIAGNOSIS — I25118 Atherosclerotic heart disease of native coronary artery with other forms of angina pectoris: Secondary | ICD-10-CM

## 2016-06-22 DIAGNOSIS — I25708 Atherosclerosis of coronary artery bypass graft(s), unspecified, with other forms of angina pectoris: Secondary | ICD-10-CM

## 2016-06-22 MED ORDER — METFORMIN HCL 500 MG PO TABS: 500.0000 mg | ORAL_TABLET | Freq: Two times a day (BID) | ORAL | 1 refills | Status: DC

## 2016-06-22 NOTE — Progress Notes (Addendum)
[address]       [place] [address]             [phone]     CARDIOTHORACIC SURGERY CONSULTATION REPORT  Referring Provider is Croitoru, Dani Gobble, MD PCP is Purvis Kilts, MD  Chief Complaint  Patient presents with  . Mitral Regurgitation    TEE 06/15/16, CATH 06/21/16  . Coronary Artery Disease    HPI:  Patient is a 70 year old male with history of coronary artery disease status post coronary artery bypass grafting 4 in 2007, ischemic cardiomyopathy with chronic combined systolic and diastolic congestive heart failure, mitral regurgitation, hypertension, type 2 diabetes mellitus, and hyperlipidemia who has been referred for surgical consultation to discuss treatment options for management of severe multivessel coronary artery disease, vein graft disease, and ischemic mitral regurgitation.  The patient's cardiac history dates back to 2007 when he originally presented with hypertensive crisis with severe hypertension. He was found to have renal artery stenosis and left main and three-vessel coronary artery disease.  He underwent coronary artery bypass grafting 4 with grafts placed at time of surgery including left internal mammary artery to the distal left anterior descending coronary artery, saphenous vein graft to the ramus intermediate branch, and sequential saphenous vein graft to the posterior descending coronary artery and the right posterior lateral branch. Postoperatively he developed atrial fibrillation which ultimately required cardioversion. His postoperative recovery was otherwise uneventful.   He did well until September 2016 when he presented with acute respiratory failure secondary to acute pulmonary edema from congestive heart failure. At the time he was traveling in [place].  He was hospitalized there where by report echocardiogram revealed ejection fraction 35% with moderate to severe mitral regurgitation.  He underwent cardiac  catheterization and was found to have 100% occlusion of all of the main native coronary arteries with continued patency of the left internal mammary artery graft to the distal left anterior descending coronary artery, 100% occlusion of the vein graft to the left circumflex system, and 90% stenosis of an otherwise patent vein graft to the right coronary system. He underwent PCI and stenting of the vein graft to the right coronary system using a drug eluting stent.  Since then the patient has had persistent symptoms of chronic combined systolic and congestive heart failure, and he has been hospitalized on 2 different occasions in [place] for acute exacerbations of class IV symptoms.  The patient was recently seen in follow-up by Dr. Sallyanne Kuster who performed transesophageal echocardiogram on 06/15/2016.  TEE revealed moderate to severe left ventricular chamber enlargement with ejection fraction estimated 35-40%. There was severe hypokinesis of the anterolateral and inferolateral myocardium. There was akinesis of the basal mid inferior myocardium. There was severe mitral regurgitation with an eccentric jet of regurgitation directed posteriorly and flow reversal in the pulmonary veins. There was moderate left atrial enlargement. Left and right heart catheterization was performed 06/21/2016 by Dr. Burt Knack. This confirmed the presence of severe left main and three-vessel coronary artery disease with 100% occlusion of all of the native artery arteries. There was continued patency of the left internal mammary artery to the distal left anterior descending coronary artery, although severe disease in the native left anterior descending coronary artery beyond the distal insertion of the graft. There was chronic occlusion of vein graft to the left circumflex system. There was continued patency of vein graft to the right coronary system with high-grade 99% stenosis in the midportion of the  graft at the site of previous  stent insertion. There was mild to moderate pulmonary hypertension with very large V waves on wedge tracing consistent with severe mitral regurgitation. The patient was referred for surgical consultation.  The patient is married and lives in [place] with his wife. He has been retired since 2000. He has remained fairly active physically until the past year where he has been severely limited by exertional shortness of breath. He states that ever since he became ill and was hospitalized in September 2016 he has been severely limited by exertional shortness of breath. He gets short of breath with very mild activity. He still tries to go to cardiac rehabilitation in exercise, but he has to stop and take breaks frequently. He has had intervening episodes of shortness of breath at rest, PND, and orthopnea. He has been hospitalized twice with acute exacerbations as previously noted, most recently in July of this year. He has had epigastric chest discomfort which he describes as "heartburn-like" symptoms that occur sporadically and are alleviated by administration of aspirin. Symptoms occur as often as once a week and usually resolve within 10-15 minutes. They are not associated with physical activity. The patient has not had palpitations, dizzy spells, or syncope. His appetite is poor and he has lost more than 30 pounds of weight. He has a persistent dry nonproductive cough.  Past Medical History:  Diagnosis Date  . CAD (coronary artery disease)   . Coronary artery disease involving coronary bypass graft   . DM2 (diabetes mellitus, type 2) ([place]) 08/26/2013  . Heart attack ([place])   . HTN (hypertension) 08/26/2013  . Hyperlipidemia 08/26/2013  . Left main coronary artery disease   . Left renal artery stenosis ([place])    Genesis 6x12 stent 2007  . Obesity (BMI 30.0-34.9) 08/26/2013  . Postoperative atrial fibrillation ([place]) 10/15/2005  . S/P CABG x 4 10/13/2005   LIMA to LAD, SVG to intermediate branch,  sequential SVG to PDA and RPL branch, EVH via right thigh    Past Surgical History:  Procedure Laterality Date  . CARDIAC CATHETERIZATION N/A 06/21/2016   Procedure: Right/Left Heart Cath and Coronary/Graft Angiography;  Surgeon: Sherren Mocha, MD;  Location: [facility];  Service: Cardiovascular;  Laterality: N/A;  . CORONARY ARTERY BYPASS GRAFT  10/13/2005   LIMA to LAD, SVG to intermediate branch, sequential SVG to PDA and RPL  . MYOCARDICAL PERFUSION  10/09/2007   NORMAL PERFUSION IN ALL REGIONS;NO EVIDENCE OF INDUCIBLE ISCHEMIA;POST STRESS EF% 66  . PERCUTANEOUS CORONARY STENT INTERVENTION (PCI-S)     DES in SVG to right coronary artery system  . RENAL DOPPLER  03/28/2010   RIGHT RA-NORMAL;LEFT PROXIMAL RA AT STENT-PATENT WITH NO EVIDENCE OF SIGN DIAMETER REDUCTION. R & L KIDNEYS: EQUAL IN SIZE,SYMMETRICAL IN SHAPE.  . TEE WITHOUT CARDIOVERSION N/A 06/15/2016   Procedure: TRANSESOPHAGEAL ECHOCARDIOGRAM (TEE);  Surgeon: Sanda Klein, MD;  Location: [facility];  Service: Cardiovascular;  Laterality: N/A;  . TRANSESOPHAGEAL ECHOCARDIOGRAM  10/19/2005   NORMAL LV; MILD TO MODERATE AMOUNT OF SOFT ATHEROMATOUS PLAQUE OF THE THORACIC AORTA; THE LEFT ATRIUM IS MILDLY DILATED;LEFT ATRIAL APPENDAGE FUNCTION IS NORMAL;NO THROMBUS IDENTIFIED. SMALL PFO WITH RIGHT TO LEFT SHUNT    Family History  Problem Relation Age of Onset  . Heart attack Mother   . Heart disease Mother   . Heart attack Father   . Heart disease Father   . Hypertension Father   . Hypertension Sister   . Heart disease Maternal Grandmother  Social History   Social History  . Marital status: Married    Spouse name: N/A  . Number of children: N/A  . Years of education: N/A   Occupational History  . Not on file.   Social History Main Topics  . Smoking status: Never Smoker  . Smokeless tobacco: Never Used  . Alcohol use No  . Drug use: No  . Sexual activity: Not Currently   Other Topics Concern  . Not  on file   Social History Narrative  . No narrative on file    Current Outpatient Prescriptions  Medication Sig Dispense Refill  . amLODipine (NORVASC) 2.5 MG tablet Take 1 tablet (2.5 mg total) by mouth daily. (Patient taking differently: Take 2.5 mg by mouth every evening. ) 180 tablet 3  . aspirin EC 81 MG tablet Take 81 mg by mouth every evening.    . carvedilol (COREG) 25 MG tablet Take 1 tablet (25 mg total) by mouth 2 (two) times daily. 180 tablet 3  . clopidogrel (PLAVIX) 75 MG tablet TAKE ONE TABLET BY MOUTH ONCE DAILY 90 tablet 3  . furosemide (LASIX) 40 MG tablet Take 1 tablet (40 mg total) by mouth 2 (two) times daily. 180 tablet 3  . glucose blood (ACCU-CHEK AVIVA) test strip Use as instructed bid E11.65 100 each 5  . lisinopril (PRINIVIL,ZESTRIL) 40 MG tablet Take 1 tablet (40 mg total) by mouth daily. (Patient taking differently: Take 40 mg by mouth every evening. ) 90 tablet 3  . metFORMIN (GLUCOPHAGE) 500 MG tablet Take 1 tablet (500 mg total) by mouth 2 (two) times daily with a meal. 180 tablet 1  . potassium chloride (K-DUR,KLOR-CON) 10 MEQ tablet Take 2 tablets (20 mEq total) by mouth 2 (two) times daily. 360 tablet 3  . rosuvastatin (CRESTOR) 10 MG tablet Take 1 tablet (10 mg total) by mouth daily. (Patient taking differently: Take 10 mg by mouth every evening. ) 90 tablet 3   No current facility-administered medications for this visit.     Allergies  Allergen Reactions  . Xanax [Alprazolam] Other (See Comments)    Pt feels very weak and tired       Review of Systems:   General:  poor appetite, decreased energy, no weight gain, + weight loss, no fever  Cardiac:  no chest pain with exertion, + chest pain at rest, + SOB with exertion, + occasional resting SOB, + PND, + orthopnea, no palpitations, no arrhythmia, no atrial fibrillation, no LE edema, no dizzy spells, no syncope  Respiratory:  + shortness of breath, no home oxygen, no productive cough, + dry cough, no  bronchitis, no wheezing, no hemoptysis, no asthma, no pain with inspiration or cough, no sleep apnea, no CPAP at night  GI:   no difficulty swallowing, no reflux, no frequent heartburn, no hiatal hernia, no abdominal pain, + constipation, no diarrhea, no hematochezia, no hematemesis, no melena  GU:   no dysuria,  no frequency, no urinary tract infection, no hematuria, no enlarged prostate, no kidney stones, no kidney disease  Vascular:  no pain suggestive of claudication, no pain in feet, no leg cramps, no varicose veins, no DVT, no non-healing foot ulcer  Neuro:   no stroke, no TIA's, no seizures, no headaches, no temporary blindness one eye,  no slurred speech, no peripheral neuropathy, no chronic pain, no instability of gait, no memory/cognitive dysfunction  Musculoskeletal: no arthritis, no joint swelling, no myalgias, no difficulty walking, normal mobility   Skin:  no rash, no itching, no skin infections, no pressure sores or ulcerations  Psych:   + anxiety, + depression, no nervousness, no unusual recent stress  Eyes:   no blurry vision, no floaters, no recent vision changes, + wears glasses or contacts  ENT:   + hearing loss, no loose or painful teeth, no dentures, last saw dentist 06/20/2016  Hematologic:  no easy bruising, no abnormal bleeding, no clotting disorder, no frequent epistaxis  Endocrine:  + diabetes, does not routinely check CBG's at home but most recent Hgb a1c < 6.0     Physical Exam:   BP 128/78   Pulse 66   Resp 16   Ht 6' (1.829 m)   Wt 178 lb (80.7 kg)   SpO2 99% Comment: ON RA  BMI 24.14 kg/m   General:    well-appearing  HEENT:  Unremarkable except for large cystic mass right side of neck  Neck:   no JVD, no bruits, no adenopathy   Chest:   clear to auscultation, symmetrical breath sounds, no wheezes, no rhonchi, sternum stable, previous sternotomy scar  CV:   RRR, grade IV/VI holosystolic murmur best at apex   Abdomen:  soft, non-tender, no masses    Extremities:  warm, well-perfused, pulses diminished, no LE edema, small scar right distal thigh from previous EVH  Rectal/GU  Deferred  Neuro:   Grossly non-focal and symmetrical throughout  Skin:   Clean and dry, no rashes, no breakdown   Diagnostic Tests:  Transesophageal Echocardiography  Patient:    Shailen, Ugalde MR #:       [id number] Study Date: 06/15/2016 Gender:     M Age:        11 Height:     182.9 cm Weight:     82 kg BSA:        2.05 m^2 Pt. Status: Room:   SONOGRAPHER  Florentina Jenny, [place] Croitoru, MD  PERFORMING   Sanda Klein, MD  cc:  ------------------------------------------------------------------- LV EF: 35% -   40%  ------------------------------------------------------------------- Indications:      Mitral regurgitation 424.0.  ------------------------------------------------------------------- Study Conclusions  - Left ventricle: The cavity size was moderately to severely   dilated. Systolic function was moderately reduced. The estimated   ejection fraction was in the range of 35% to 40%. Severe   hypokinesis of the anterolateral and inferolateral myocardium;   consistent with infarction in the distribution of the left   circumflex coronary artery. Akinesis of the basal-midinferior   myocardium; consistent with infarction in the distribution of the   right coronary artery. The basal anterior wall is hypokinetic.   The apical segments of the anterior and inferior wall and the   septum contract normally, consistent with occluded native   arteries and patent distal grafts. - Aortic valve: No evidence of vegetation. - Mitral valve: There was severe regurgitation directed   eccentrically and posteriorly. Severe regurgitation is suggested   by pulmonary vein systolic flow reversal. - Left atrium: The atrium was moderately dilated. No evidence of   thrombus in the atrial cavity or appendage. No evidence of    thrombus. - Right atrium: No evidence of thrombus in the atrial cavity or   appendage. - Atrial septum: No defect or patent foramen ovale was identified. - Tricuspid valve: No evidence of vegetation. - Pulmonic valve: No evidence of vegetation.  ------------------------------------------------------------------- Study data:   Study status:  Routine.  Consent:  The risks, benefits, and  alternatives to the procedure were explained to the patient and informed consent was obtained.  Procedure:  The patient reported no pain pre or post test. Initial setup. The patient was brought to the laboratory. Surface ECG leads were monitored. Sedation. Conscious sedation was administered by cardiology staff. Transesophageal echocardiography. An adult multiplane transesophageal probe was inserted by the attending cardiologistwithout difficulty. Image quality was adequate.  Study completion:  The patient tolerated the procedure well. There were no complications.  Administered medications:   Fentanyl, 27mcg, IV.  Midazolam, 2mg , IV.          Diagnostic transesophageal echocardiography.  2D and color Doppler.  Birthdate:  Patient birthdate: September 12, 1946.  Age:  Patient is 70 yr old.  Sex:  Gender: male.    BMI: 24.5 kg/m^2.  Blood pressure:     138/55  Patient status:  Outpatient.  Study date:  Study date: 06/15/2016. Study time: 01:34 PM.  Location:  Endoscopy.  -------------------------------------------------------------------  ------------------------------------------------------------------- Left ventricle:  The cavity size was moderately to severely dilated. Systolic function was moderately reduced. The estimated ejection fraction was in the range of 35% to 40%.  Regional wall motion abnormalities:   Severe hypokinesis of the anterolateral and inferolateral myocardium; consistent with infarction in the distribution of the left circumflex coronary artery.  Akinesis of the basal-midinferior  myocardium; consistent with infarction in the distribution of the right coronary artery.  ------------------------------------------------------------------- Aortic valve:   Structurally normal valve.   Cusp separation was normal.  No evidence of vegetation.  Doppler:  There was no regurgitation.  ------------------------------------------------------------------- Aorta:  The aorta was normal. Descending aorta: The descending aorta had moderate diffuse disease.  ------------------------------------------------------------------- Mitral valve:   Mildly thickened leaflets . There is malcoaptation due to ischemic tethering of the posterior leaflet, most obvious at the A2P2 and A3P3 commissure.  Doppler:  There was severe regurgitation directed eccentrically and posteriorly. Severe regurgitation is suggested by pulmonary vein systolic flow reversal.  ------------------------------------------------------------------- Left atrium:  The atrium was moderately dilated.  No evidence of thrombus in the atrial cavity or appendage.  No evidence of thrombus. The appendage was of normal size. Emptying velocity was normal.  ------------------------------------------------------------------- Atrial septum:  No defect or patent foramen ovale was identified.   ------------------------------------------------------------------- Right ventricle:  The cavity size was normal. Wall thickness was normal. Systolic function was normal.  ------------------------------------------------------------------- Pulmonic valve:    Structurally normal valve.   Cusp separation was normal.  No evidence of vegetation.  Doppler:  There was trivial regurgitation.  ------------------------------------------------------------------- Tricuspid valve:   Structurally normal valve.   Leaflet separation was normal.  No evidence of vegetation.  Doppler:  There was trivial  regurgitation.  ------------------------------------------------------------------- Right atrium:  The atrium was normal in size.  No evidence of thrombus in the atrial cavity or appendage.  ------------------------------------------------------------------- Pericardium:  There was no pericardial effusion.   ------------------------------------------------------------------- Post procedure conclusions Ascending Aorta:  - The aorta was normal.  ------------------------------------------------------------------- Measurements   Left ventricle                           Value      Reference  LV ID, ED, PLAX maximal              (H) 76.81 mm   36 - 54  LV ID, ES, PLAX maximal              (H) 65.09 mm   23 - 40  LV fx shortening,  PLAX maximal       (L) 15    %    >=29    Mitral valve                             Value      Reference  Mitral regurg vena contracta             4.39  mm   ---------  Aliasing velocity, MR PISA               38.5  cm/s ---------  Mitral regurg PISA radius                10    mm   ---------  Mitral maximal regurg velocity, PISA     754   cm/s ---------  Mitral regurg VTI, PISA                  243   cm   ---------  Mitral ERO, PISA                         0.32  cm^2 ---------  Mitral regurg volume, PISA               78    ml   ---------  Legend: (L)  and  (H)  mark values outside specified reference range.  ------------------------------------------------------------------- Prepared and Electronically Authenticated by  Sanda Klein, MD 2017-09-14T16:14:15   Right/Left Heart Cath and Coronary/Graft Angiography  Conclusion   1. Severe left main and three-vessel coronary artery disease 2. Status post aortocoronary bypass surgery with continued patency of the LIMA to LAD, chronic occlusion of the saphenous vein graft to diagonal/intermediate, and patency of the saphenous vein graft to PDA with severe stenosis in the mid body of the graft at a  site of previous stent insertion 3. Known severe mitral regurgitation with giant V waves in the pulmonary wedge tracing  Patient is scheduled to see Dr. Roxy Manns tomorrow for his cardiac surgical evaluation for consideration of redo CABG/mitral valve repair or replacement  Indications   Severe mitral regurgitation [I34.0 (ICD-10-CM)]  Procedural Details/Technique   Technical Details INDICATION: Severe mitral regurgitation, known coronary artery disease status post CABG, preoperative assessment.  PROCEDURAL DETAILS: There was an indwelling IV in a left antecubital vein. Using normal sterile technique, the IV was changed out for a 5/6 Fr brachial sheath over a 0.018 inch wire. The left wrist was then prepped, draped, and anesthetized with 1% lidocaine. Using the modified Seldinger technique a 5/6 French Slender sheath was placed in the left radial artery. Intra-arterial verapamil was administered through the radial artery sheath. IV heparin was administered after a JR4 catheter was advanced into the central aorta. A Swan-Ganz catheter was used for the right heart catheterization. Standard protocol was followed for recording of right heart pressures and sampling of oxygen saturations. Fick cardiac output was calculated. Standard Judkins catheters were used for selective coronary angiography and bypass graft angiography. There were no immediate procedural complications. The patient was transferred to the post catheterization recovery area for further monitoring.  During this procedure the patient is administered a total of Versed 2 mg and Fentanyl 25 mg to achieve and maintain moderate conscious sedation. The patient's heart rate, blood pressure, and oxygen saturation are monitored continuously during the procedure. The period of conscious sedation is 43 minutes, of which I was present face-to-face 100%  of this time.   Estimated blood loss <50 mL. .    Coronary Findings   Dominance: Right  Left Main   Ost LM lesion, 90% stenosed. The distal left main has severe 90% stenosis  Left Anterior Descending  Prox LAD to Mid LAD lesion, 90% stenosed. There is diffuse 80-90% stenosis throughout the proximal LAD. There is a large septal cascade that arises from this area.  Dist LAD lesion, 100% stenosed.  First Diagonal Branch  The diagonal branches fill late from left to left collaterals. The branch appears small in caliber but is probably underfilled and there are 3 distinct subbranches visualized. 1st Diag filled by collaterals from 1st Mrg.  Ost 1st Diag to 1st Diag lesion, 100% stenosed.  Left Circumflex  Ost Cx lesion, 80% stenosed. The circumflex has tight ostial stenosis. The vessel is occluded in its midportion.  Mid Cx lesion, 100% stenosed.  Right Coronary Artery  Prox RCA lesion, 100% stenosed.  First Right Posterolateral  1st RPLB lesion, 80% stenosed. There is a tight lesion in the posterior AV segment leading into the first PLA branch  Graft Angiography  saphenous Graft to RPDA  SVG. This graft as a sequential to the PDA and PLA, but I can only visualize the PDA limb of the graft.  Mid Graft lesion, 95% stenosed. The lesion was previously treated. There is severe stenosis in the mid body of the graft to the PDA. This is probably in-stent restenosis, but the stent is very difficult to visualize.  LIMA Graft to Mid LAD  The LIMA to LAD is widely patent. Beyond the LIMA insertion there is competitive filling. The apical LAD is difficult to visualize and may be totally occluded  Graft to 1st Diag  Prox Graft lesion, 100% stenosed.  Coronary Diagrams   Diagnostic Diagram     Implants     No implant documentation for this case.  PACS Images   Show images for Cardiac catheterization   Link to Procedure Log   Procedure Log    Hemo Data   Flowsheet Row Most Recent Value  Fick Cardiac Output 5.04 L/min  Fick Cardiac Output Index 2.47 (L/min)/BSA  RA A Wave 3 mmHg  RA V Wave  3 mmHg  RA Mean 1 mmHg  RV Systolic Pressure 49 mmHg  RV Diastolic Pressure -1 mmHg  RV EDP 6 mmHg  PA Systolic Pressure 50 mmHg  PA Diastolic Pressure 18 mmHg  PA Mean 34 mmHg  PW A Wave 24 mmHg  PW V Wave 46 mmHg  PW Mean 27 mmHg  AO Systolic Pressure 123456 mmHg  AO Diastolic Pressure 65 mmHg  AO Mean 89 mmHg  LV Systolic Pressure 123XX123 mmHg  LV Diastolic Pressure 5 mmHg  LV EDP 24 mmHg  Arterial Occlusion Pressure Extended Systolic Pressure 123456 mmHg  Arterial Occlusion Pressure Extended Diastolic Pressure 65 mmHg  Arterial Occlusion Pressure Extended Mean Pressure 89 mmHg  Left Ventricular Apex Extended Systolic Pressure 123XX123 mmHg  Left Ventricular Apex Extended Diastolic Pressure 6 mmHg  Left Ventricular Apex Extended EDP Pressure 25 mmHg  QP/QS 1  TPVR Index 13.76 HRUI  TSVR Index 36.04 HRUI  PVR SVR Ratio 0.08  TPVR/TSVR Ratio 0.38     STS Risk Calculator  Procedure    Redo CABG + MVR  Risk of Mortality   7.8% Morbidity or Mortality  37.6% Prolonged LOS   16.7% Short LOS    12.7% Permanent Stroke   2.2% Prolonged Vent Support  26.8%  DSW Infection    0.8% Renal Failure    13.3% Reoperation    13.3%    Impression:  Patient has critical left main disease, three-vessel coronary artery disease, and progressive vein graft disease status post coronary artery bypass grafting in 2007 with severe ischemic cardiomyopathy and severe symptomatic secondary (ischemic) mitral regurgitation.  He presents with chronic symptoms of exertional shortness of breath and fatigue consistent with chronic combined systolic and diastolic congestive heart failure, New York Heart Association functional class III and frequent recurrent episodes of acute exacerbations of class IV symptoms. The patient also describes recurrent episodes of burning substernal chest pain consistent with unstable angina pectoris. I have personally reviewed the patient's recent transesophageal echocardiogram and  diagnostic cardiac catheterization. TEE confirmed the presence of severe left ventricular systolic dysfunction with multiple wall motion abnormalities consistent with multiple previous transmural myocardial infarctions and moderate to severe left ventricular chamber enlargement.  Ejection fraction was estimated 35% but this is in the setting of severe (4+) mitral regurgitation.  The patient has classical type IIIB dysfunction of the mitral valve with severe ischemic mitral regurgitation. Diagnostic cardiac catheterization reveals findings as noted above. All 3 main native coronary arteries are 100% chronically occluded with minimal perfusion from the right coronary system and perfusion from the left main involving only the high interventricular septum. There is continued patency of the left internal mammary artery to the distal left anterior descending coronary artery but diffuse disease in the native left anterior descending coronary artery with subtotal occlusion beyond the distal insertion of the left internal mammary artery graft. There is continued patency of the vein graft to the posterior descending coronary artery but critical high-grade restenosis in the midportion of this vein graft at the site of previous stent placement performed one year ago. The vein graft to the circumflex system is chronically occluded.  Right heart catheterization was notable for only moderate pulmonary hypertension, low central venous pressure and preserved cardiac output.  Under the circumstances it seems clear that the patient's only reasonable option is high-risk redo coronary artery bypass grafting with mitral valve repair or replacement. Repeat PCI and stenting of the vein graft to the posterior descending coronary artery could be considered as a short-term palliative measure. Combining PCI with palliative edge to edge MitraClip would be an off label indication for a procedure that would also be palliative at best.   Ultimately if the patient's condition deteriorates much further the only options that might be considered would require long-term mechanical circulatory support with or without plans to consider evaluation for cardiac transplantation.   Plan:  I spent in excess of 30 minutes discussing matters at length with the patient and his wife in the office today. We directly reviewed images from his recent TEE and diagnostic cardiac catheterization.  We discussed the very grave prognosis associated with his current combination of problems as well as concerns regarding the possibility that he could suffer a potentially fatal myocardial infarction at the vein graft to the right coronary system were to occlude acutely.  We discussed treatment options at length including redo coronary artery bypass grafting with mitral valve repair or replacement, repeat PCI with or without stenting of the vein graft the right coronary system, edge to edge MitraClip, implantation of left ventricular assist device, and referral for cardiac transplantation. Anticipated risks associated with redo coronary artery bypass grafting and mitral valve repair or replacement were discussed. Expectations for the patient's postoperative recovery were discussed. All of their questions have  been addressed.  The patient wants to discuss matters further with his wife before making a final decision. They both expressed complete understanding of the issues at state as well as concerns regarding the risk for possible acute myocardial infarction in the immediate future.   I spent in excess of 90 minutes during the conduct of this office consultation and >50% of this time involved direct face-to-face encounter with the patient for counseling and/or coordination of their care.    Valentina Gu. Roxy Manns, MD 06/22/2016 3:12 PM

## 2016-06-22 NOTE — Patient Instructions (Signed)
Continue all previous medications without any changes at this time  Call as soon as possible if you desire to proceed with surgery

## 2016-06-22 NOTE — Progress Notes (Signed)
Thank you both for helping with this challenging situation! I hope he agrees to have surgery Christian [place]

## 2016-06-26 ENCOUNTER — Other Ambulatory Visit: Payer: Self-pay | Admitting: *Deleted

## 2016-06-26 DIAGNOSIS — I340 Nonrheumatic mitral (valve) insufficiency: Secondary | ICD-10-CM

## 2016-06-26 DIAGNOSIS — R0789 Other chest pain: Secondary | ICD-10-CM

## 2016-06-26 DIAGNOSIS — Z01818 Encounter for other preprocedural examination: Secondary | ICD-10-CM

## 2016-06-26 DIAGNOSIS — I2510 Atherosclerotic heart disease of native coronary artery without angina pectoris: Secondary | ICD-10-CM

## 2016-06-27 ENCOUNTER — Telehealth: Payer: Self-pay | Admitting: Cardiovascular Disease

## 2016-06-27 NOTE — Telephone Encounter (Signed)
I think it's okay to cancel. Will enlist Dr. Clayborne Dana help in the postoperative period as needed.

## 2016-06-27 NOTE — Telephone Encounter (Signed)
Returned call to patient wife (DPR) Notes no urgent concerns. States pt was set up to see Dr. Haroldine Laws for an October 9th appt, but this was prior to knowing whether he was going to have valve replacement surgery (which is now scheduled for Oct 10th w Dr. Roxy Manns). Wants to know if he needs to keep the appt on 9th or ok to cancel.  Seeking Dr. Victorino December recommendation. Will route for recommendation.

## 2016-06-27 NOTE — Telephone Encounter (Signed)
Advice communicated, [name] stated they wished to cancel and I have cancelled appt per request. Aware to call if further needs.

## 2016-06-27 NOTE — Telephone Encounter (Signed)
Christian Bowman is wanting to know if her husband should keep the appt w/ Dr. Danelle Earthly.

## 2016-07-04 ENCOUNTER — Encounter: Payer: Self-pay | Admitting: Thoracic Surgery (Cardiothoracic Vascular Surgery)

## 2016-07-06 ENCOUNTER — Encounter (HOSPITAL_COMMUNITY): Payer: Medicare Other

## 2016-07-06 ENCOUNTER — Other Ambulatory Visit (HOSPITAL_COMMUNITY): Payer: Medicare Other

## 2016-07-07 ENCOUNTER — Encounter (HOSPITAL_COMMUNITY): Payer: Self-pay

## 2016-07-07 ENCOUNTER — Ambulatory Visit (HOSPITAL_COMMUNITY)
Admission: RE | Admit: 2016-07-07 | Discharge: 2016-07-07 | Disposition: A | Discharge status: Home / Self Care | Payer: Medicare Other | Source: Ambulatory Visit | Attending: Thoracic Surgery (Cardiothoracic Vascular Surgery) | Admitting: Thoracic Surgery (Cardiothoracic Vascular Surgery)

## 2016-07-07 ENCOUNTER — Ambulatory Visit (HOSPITAL_BASED_OUTPATIENT_CLINIC_OR_DEPARTMENT_OTHER)
Admission: RE | Admit: 2016-07-07 | Discharge: 2016-07-07 | Disposition: A | Discharge status: Home / Self Care | Payer: Medicare Other | Source: Ambulatory Visit | Attending: Thoracic Surgery (Cardiothoracic Vascular Surgery) | Admitting: Thoracic Surgery (Cardiothoracic Vascular Surgery)

## 2016-07-07 ENCOUNTER — Encounter (HOSPITAL_COMMUNITY)
Admission: RE | Admit: 2016-07-07 | Discharge: 2016-07-07 | Disposition: A | Discharge status: Home / Self Care | Payer: Medicare Other | Source: Ambulatory Visit | Attending: Thoracic Surgery (Cardiothoracic Vascular Surgery) | Admitting: Thoracic Surgery (Cardiothoracic Vascular Surgery)

## 2016-07-07 ENCOUNTER — Ambulatory Visit
Admission: RE | Admit: 2016-07-07 | Discharge: 2016-07-07 | Disposition: A | Discharge status: Home / Self Care | Payer: Medicare Other | Source: Ambulatory Visit | Attending: Thoracic Surgery (Cardiothoracic Vascular Surgery) | Admitting: Thoracic Surgery (Cardiothoracic Vascular Surgery)

## 2016-07-07 DIAGNOSIS — I2510 Atherosclerotic heart disease of native coronary artery without angina pectoris: Secondary | ICD-10-CM | POA: Diagnosis not present

## 2016-07-07 DIAGNOSIS — I700 Atherosclerosis of aorta: Secondary | ICD-10-CM | POA: Insufficient documentation

## 2016-07-07 DIAGNOSIS — R0789 Other chest pain: Secondary | ICD-10-CM | POA: Diagnosis not present

## 2016-07-07 DIAGNOSIS — I340 Nonrheumatic mitral (valve) insufficiency: Secondary | ICD-10-CM

## 2016-07-07 DIAGNOSIS — J984 Other disorders of lung: Secondary | ICD-10-CM | POA: Insufficient documentation

## 2016-07-07 DIAGNOSIS — Z951 Presence of aortocoronary bypass graft: Secondary | ICD-10-CM | POA: Insufficient documentation

## 2016-07-07 DIAGNOSIS — I517 Cardiomegaly: Secondary | ICD-10-CM | POA: Insufficient documentation

## 2016-07-07 DIAGNOSIS — Z01818 Encounter for other preprocedural examination: Secondary | ICD-10-CM | POA: Diagnosis not present

## 2016-07-07 DIAGNOSIS — I6523 Occlusion and stenosis of bilateral carotid arteries: Secondary | ICD-10-CM | POA: Diagnosis not present

## 2016-07-07 DIAGNOSIS — J449 Chronic obstructive pulmonary disease, unspecified: Secondary | ICD-10-CM | POA: Diagnosis not present

## 2016-07-07 HISTORY — DX: Angina pectoris, unspecified: I20.9

## 2016-07-07 HISTORY — DX: Dyspnea, unspecified: R06.00

## 2016-07-07 HISTORY — DX: Reserved for concepts with insufficient information to code with codable children: IMO0002

## 2016-07-07 LAB — COMPREHENSIVE METABOLIC PANEL
ALBUMIN: 4 g/dL (ref 3.5–5.0)
ALK PHOS: 56 U/L (ref 38–126)
ALT: 20 U/L (ref 17–63)
AST: 18 U/L (ref 15–41)
Anion gap: 9 (ref 5–15)
BILIRUBIN TOTAL: 0.7 mg/dL (ref 0.3–1.2)
BUN: 24 mg/dL — AB (ref 6–20)
CALCIUM: 9.2 mg/dL (ref 8.9–10.3)
CO2: 25 mmol/L (ref 22–32)
Chloride: 106 mmol/L (ref 101–111)
Creatinine, Ser: 1.16 mg/dL (ref 0.61–1.24)
GFR calc Af Amer: >60 mL/min (ref >60)
GFR calc non Af Amer: >60 mL/min (ref >60)
GLUCOSE: 127 mg/dL — AB (ref 65–99)
POTASSIUM: 3.9 mmol/L (ref 3.5–5.1)
Sodium: 140 mmol/L (ref 135–145)
TOTAL PROTEIN: 7.3 g/dL (ref 6.5–8.1)

## 2016-07-07 LAB — CBC
HEMATOCRIT: 37.3 % — AB (ref 39.0–52.0)
HEMOGLOBIN: 11.6 g/dL — AB (ref 13.0–17.0)
MCH: 27.4 pg (ref 26.0–34.0)
MCHC: 31.1 g/dL (ref 30.0–36.0)
MCV: 88.2 fL (ref 78.0–100.0)
Platelets: 175 10*3/uL (ref 150–400)
RBC: 4.23 MIL/uL (ref 4.22–5.81)
RDW: 13.7 % (ref 11.5–15.5)
WBC: 8.9 10*3/uL (ref 4.0–10.5)

## 2016-07-07 LAB — URINALYSIS, ROUTINE W REFLEX MICROSCOPIC
BILIRUBIN URINE: NEGATIVE
Glucose, UA: NEGATIVE mg/dL
Ketones, ur: NEGATIVE mg/dL
NITRITE: NEGATIVE
PROTEIN: NEGATIVE mg/dL
SPECIFIC GRAVITY, URINE: 1.015 (ref 1.005–1.030)
pH: 5.5 (ref 5.0–8.0)

## 2016-07-07 LAB — PULMONARY FUNCTION TEST
DL/VA % pred: 72 %
DL/VA: 3.38 ml/min/mmHg/L
DLCO UNC: 22.79 ml/min/mmHg
DLCO unc % pred: 67 %
FEF 25-75 PRE: 1.84 L/s
FEF 25-75 Post: 2.64 L/sec
FEF2575-%Change-Post: 43 %
FEF2575-%PRED-POST: 99 %
FEF2575-%Pred-Pre: 69 %
FEV1-%Change-Post: 6 %
FEV1-%PRED-POST: 91 %
FEV1-%PRED-PRE: 86 %
FEV1-POST: 3.15 L
FEV1-Pre: 2.96 L
FEV1FVC-%Change-Post: 0 %
FEV1FVC-%PRED-PRE: 96 %
FEV6-%CHANGE-POST: 7 %
FEV6-%PRED-POST: 98 %
FEV6-%Pred-Pre: 92 %
FEV6-PRE: 4.06 L
FEV6-Post: 4.36 L
FEV6FVC-%CHANGE-POST: 1 %
FEV6FVC-%PRED-POST: 104 %
FEV6FVC-%PRED-PRE: 102 %
FVC-%Change-Post: 6 %
FVC-%PRED-POST: 94 %
FVC-%Pred-Pre: 89 %
FVC-Post: 4.4 L
FVC-Pre: 4.15 L
POST FEV6/FVC RATIO: 99 %
PRE FEV1/FVC RATIO: 71 %
PRE FEV6/FVC RATIO: 98 %
Post FEV1/FVC ratio: 72 %
RV % PRED: 121 %
RV: 3.01 L
TLC % PRED: 103 %
TLC: 7.49 L

## 2016-07-07 LAB — URINE MICROSCOPIC-ADD ON

## 2016-07-07 LAB — SURGICAL PCR SCREEN
MRSA, PCR: NEGATIVE
STAPHYLOCOCCUS AUREUS: NEGATIVE

## 2016-07-07 LAB — PROTIME-INR
INR: 1.14
Prothrombin Time: 14.6 seconds (ref 11.4–15.2)

## 2016-07-07 LAB — APTT: aPTT: 41 seconds — ABNORMAL HIGH (ref 24–36)

## 2016-07-07 LAB — GLUCOSE, CAPILLARY: Glucose-Capillary: 106 mg/dL — ABNORMAL HIGH (ref 65–99)

## 2016-07-07 MED ORDER — ALBUTEROL SULFATE (2.5 MG/3ML) 0.083% IN NEBU
2.5000 mg | INHALATION_SOLUTION | Freq: Once | RESPIRATORY_TRACT | Status: AC
  Administered 2016-07-07: 2.5 mg via RESPIRATORY_TRACT

## 2016-07-07 NOTE — Pre-Procedure Instructions (Addendum)
Christian Bowman  07/07/2016      [place], [place] [address] Phone: [phone] Fax: [phone]    Your procedure is scheduled on Tues. Oct.10  Report to [facility] Admitting at 5:30A.M.  Call this number if you have problems the morning of surgery:  [phone]   Remember:  Do not eat food or drink liquids after midnight on Mon. Oct. 9   Take these medicines the morning of surgery with A SIP OF WATER : amlodipine (norvasc), carvedilol (coreg)              Stop advil, motrin, ibuprofen , aleve, goody's, BC Powders     How to Manage Your Diabetes Before and After Surgery  Why is it important to control my blood sugar before and after surgery? . Improving blood sugar levels before and after surgery helps healing and can limit problems. . A way of improving blood sugar control is eating a healthy diet by: o  Eating less sugar and carbohydrates o  Increasing activity/exercise o  Talking with your doctor about reaching your blood sugar goals . High blood sugars (greater than 180 mg/dL) can raise your risk of infections and slow your recovery, so you will need to focus on controlling your diabetes during the weeks before surgery. . Make sure that the doctor who takes care of your diabetes knows about your planned surgery including the date and location.  How do I manage my blood sugar before surgery? . Check your blood sugar at least 4 times a day, starting 2 days before surgery, to make sure that the level is not too high or low. o Check your blood sugar the morning of your surgery when you wake up and every 2 hours until you get to the Short Stay unit. . If your blood sugar is less than 70 mg/dL, you will need to treat for low blood sugar: o Do not take insulin. o Treat a low blood sugar (less than 70 mg/dL) with  cup of clear juice (cranberry or apple), 4 glucose tablets, OR glucose  gel. o Recheck blood sugar in 15 minutes after treatment (to make sure it is greater than 70 mg/dL). If your blood sugar is not greater than 70 mg/dL on recheck, call [phone] for further instructions. . Report your blood sugar to the short stay nurse when you get to Short Stay.  . If you are admitted to the hospital after surgery: o Your blood sugar will be checked by the staff and you will probably be given insulin after surgery (instead of oral diabetes medicines) to make sure you have good blood sugar levels. o The goal for blood sugar control after surgery is 80-180 mg/dL.         WHAT DO I DO ABOUT MY DIABETES MEDICATION?   [name] Do not take oral diabetes medicines (pills) the morning of surgery.    Do not wear jewelry.  Do not wear lotions, powders, or cologne, or deoderant.  Do not shave 48 hours prior to surgery.  Men may shave face and neck.  Do not bring valuables to the hospital.  [facility] is not responsible for any belongings or valuables.  Contacts, dentures or bridgework may not be worn into surgery.  Leave your suitcase in the car.  After surgery it may be brought to your room.  For patients admitted to the hospital, discharge time will  be determined by your treatment team.  Patients discharged the day of surgery will not be allowed to drive home.    Special instructions: review all handouts  Please read over the following fact sheets that you were given. Coughing and Deep Breathing and MRSA Information

## 2016-07-07 NOTE — Progress Notes (Signed)
Pre-op Cardiac Surgery  Carotid Findings:  Bilateral:  1-39% ICA stenosis.  Vertebral artery flow is antegrade.     Upper Extremity Right Left  Brachial Pressures 126 Trip[hasic 125 Triphasic  Radial Waveforms Triphasic Triphasic  Ulnar Waveforms Triphasic Triphasic  Palmar Arch (Allen's Test) Normal Normal   Findings:  Doppler waveforms remained normal with both radial and ulnar compression bilaterally.    Lower  Extremity Right Left  Dorsalis Pedis 124 Triphasic 116 Triphasic  Posterior Tibial 129 Triphasic 113 Triphasic  Ankle/Brachial Indices 1.02 0.93    Findings:  ABIs and Doppler waveforms are within normal limits bilaterally at rest    [facility], [place]  07/07/2016 05:00 PM

## 2016-07-07 NOTE — Progress Notes (Addendum)
PCP:Dr. Armandina Gemma @ [facility] in [place] Cardiologist: Dr. Emelia Salisbury Endocrinologist: Dr. Dorris Fetch in [place]  Pt. States he doesn't usually check blood sugars.States fasting sugars run 95-105.

## 2016-07-08 LAB — HEMOGLOBIN A1C
Hgb A1c MFr Bld: 6 % — ABNORMAL HIGH (ref 4.8–5.6)
Mean Plasma Glucose: 126 mg/dL

## 2016-07-10 ENCOUNTER — Encounter (HOSPITAL_COMMUNITY): Payer: Medicare Other | Admitting: Internal Medicine

## 2016-07-10 ENCOUNTER — Ambulatory Visit (INDEPENDENT_AMBULATORY_CARE_PROVIDER_SITE_OTHER): Payer: Medicare Other | Admitting: Thoracic Surgery (Cardiothoracic Vascular Surgery)

## 2016-07-10 ENCOUNTER — Encounter (HOSPITAL_COMMUNITY): Payer: Self-pay | Admitting: Certified Registered Nurse Anesthetist

## 2016-07-10 ENCOUNTER — Encounter: Payer: Self-pay | Admitting: Thoracic Surgery (Cardiothoracic Vascular Surgery)

## 2016-07-10 VITALS — BP 128/70 | HR 63 | Resp 20 | Ht 72.0 in | Wt 180.0 lb

## 2016-07-10 DIAGNOSIS — I5042 Chronic combined systolic (congestive) and diastolic (congestive) heart failure: Secondary | ICD-10-CM | POA: Diagnosis not present

## 2016-07-10 DIAGNOSIS — I25700 Atherosclerosis of coronary artery bypass graft(s), unspecified, with unstable angina pectoris: Secondary | ICD-10-CM

## 2016-07-10 DIAGNOSIS — Z951 Presence of aortocoronary bypass graft: Secondary | ICD-10-CM | POA: Diagnosis not present

## 2016-07-10 DIAGNOSIS — I2510 Atherosclerotic heart disease of native coronary artery without angina pectoris: Secondary | ICD-10-CM | POA: Diagnosis not present

## 2016-07-10 DIAGNOSIS — I340 Nonrheumatic mitral (valve) insufficiency: Secondary | ICD-10-CM

## 2016-07-10 DIAGNOSIS — I200 Unstable angina: Secondary | ICD-10-CM | POA: Diagnosis not present

## 2016-07-10 DIAGNOSIS — I25110 Atherosclerotic heart disease of native coronary artery with unstable angina pectoris: Secondary | ICD-10-CM

## 2016-07-10 LAB — VAS US DOPPLER PRE CABG
LCCADDIAS: -16 cm/s
LEFT ECA DIAS: -9 cm/s
LEFT VERTEBRAL DIAS: -11 cm/s
LICADDIAS: -25 cm/s
LICADSYS: -83 cm/s
LICAPSYS: -95 cm/s
Left CCA dist sys: -75 cm/s
Left CCA prox dias: 12 cm/s
Left CCA prox sys: 86 cm/s
Left ICA prox dias: -26 cm/s
RCCADSYS: -75 cm/s
RIGHT ECA DIAS: -7 cm/s
RIGHT VERTEBRAL DIAS: -20 cm/s
Right CCA prox dias: 1 cm/s
Right CCA prox sys: 78 cm/s

## 2016-07-10 MED ORDER — MAGNESIUM SULFATE 50 % IJ SOLN
40.0000 meq | INTRAMUSCULAR | Status: DC
  Filled 2016-07-10: qty 10

## 2016-07-10 MED ORDER — HEPARIN SODIUM (PORCINE) 1000 UNIT/ML IJ SOLN
INTRAMUSCULAR | Status: DC
  Filled 2016-07-10: qty 30

## 2016-07-10 MED ORDER — CHLORHEXIDINE GLUCONATE 0.12 % MT SOLN
15.0000 mL | OROMUCOSAL | Status: AC
  Administered 2016-07-11: 15 mL via OROMUCOSAL
  Filled 2016-07-10: qty 15

## 2016-07-10 MED ORDER — NITROGLYCERIN IN D5W 200-5 MCG/ML-% IV SOLN
2.0000 ug/min | INTRAVENOUS | Status: DC
  Filled 2016-07-10: qty 250

## 2016-07-10 MED ORDER — EPINEPHRINE HCL 1 MG/ML IJ SOLN
0.0000 ug/min | INTRAMUSCULAR | Status: DC
  Filled 2016-07-10: qty 4

## 2016-07-10 MED ORDER — PAPAVERINE HCL 30 MG/ML IJ SOLN
INTRAMUSCULAR | Status: AC
  Administered 2016-07-11: 500 mL
  Filled 2016-07-10: qty 2.5

## 2016-07-10 MED ORDER — SODIUM CHLORIDE 0.9 % IV SOLN
1.5000 mg/kg/h | INTRAVENOUS | Status: AC
  Administered 2016-07-11: 1.5 mg/kg/h via INTRAVENOUS
  Filled 2016-07-10: qty 25

## 2016-07-10 MED ORDER — POTASSIUM CHLORIDE 2 MEQ/ML IV SOLN
80.0000 meq | INTRAVENOUS | Status: DC
  Filled 2016-07-10: qty 40

## 2016-07-10 MED ORDER — DOPAMINE-DEXTROSE 3.2-5 MG/ML-% IV SOLN
0.0000 ug/kg/min | INTRAVENOUS | Status: AC
  Administered 2016-07-11: 5 ug/kg/min via INTRAVENOUS
  Filled 2016-07-10: qty 250

## 2016-07-10 MED ORDER — PHENYLEPHRINE HCL 10 MG/ML IJ SOLN
30.0000 ug/min | INTRAVENOUS | Status: DC
  Filled 2016-07-10: qty 2

## 2016-07-10 MED ORDER — CEFUROXIME SODIUM 750 MG IJ SOLR
750.0000 mg | INTRAMUSCULAR | Status: DC
  Filled 2016-07-10: qty 750

## 2016-07-10 MED ORDER — GLUTARALDEHYDE 0.625% SOAKING SOLUTION
TOPICAL | Status: DC | PRN
  Filled 2016-07-10: qty 50

## 2016-07-10 MED ORDER — SODIUM CHLORIDE 0.9 % IV SOLN: INTRAVENOUS | Status: DC

## 2016-07-10 MED ORDER — VANCOMYCIN HCL 10 G IV SOLR
1250.0000 mg | INTRAVENOUS | Status: AC
  Administered 2016-07-11: 1250 mg via INTRAVENOUS
  Filled 2016-07-10: qty 1250

## 2016-07-10 MED ORDER — METOPROLOL TARTRATE 12.5 MG HALF TABLET: 12.5000 mg | ORAL_TABLET | ORAL | Status: DC

## 2016-07-10 MED ORDER — TRANEXAMIC ACID (OHS) BOLUS VIA INFUSION
15.0000 mg/kg | INTRAVENOUS | Status: AC
  Administered 2016-07-11: 1212 mg via INTRAVENOUS
  Filled 2016-07-10: qty 1212

## 2016-07-10 MED ORDER — DEXTROSE 5 % IV SOLN
1.5000 g | INTRAVENOUS | Status: AC
  Administered 2016-07-11 (×2): 1.5 g via INTRAVENOUS
  Filled 2016-07-10 (×2): qty 1.5

## 2016-07-10 MED ORDER — TRANEXAMIC ACID (OHS) PUMP PRIME SOLUTION
2.0000 mg/kg | INTRAVENOUS | Status: DC
  Filled 2016-07-10: qty 1.62

## 2016-07-10 MED ORDER — DEXMEDETOMIDINE HCL IN NACL 400 MCG/100ML IV SOLN
0.1000 ug/kg/h | INTRAVENOUS | Status: AC
  Administered 2016-07-11: .3 ug/kg/h via INTRAVENOUS
  Filled 2016-07-10: qty 100

## 2016-07-10 MED ORDER — SODIUM CHLORIDE 0.9 % IV SOLN
INTRAVENOUS | Status: AC
  Administered 2016-07-11: 1.6 [IU]/h via INTRAVENOUS
  Filled 2016-07-10: qty 2.5

## 2016-07-10 NOTE — H&P (Signed)
[address]       [place] [address]             [phone]          CARDIOTHORACIC SURGERY HISTORY AND PHYSICAL EXAM  Referring Provider is Croitoru, Dani Gobble, MD PCP is Purvis Kilts, MD      Chief Complaint  Patient presents with  . Mitral Regurgitation    TEE 06/15/16, CATH 06/21/16  . Coronary Artery Disease    HPI:  Patient is a 70 year old male with history of coronary artery disease status post coronary artery bypass grafting 4 in 2007, ischemic cardiomyopathy with chronic combined systolic and diastolic congestive heart failure, mitral regurgitation, hypertension, type 2 diabetes mellitus, and hyperlipidemia who has been referred for surgical consultation to discuss treatment options for management of severe multivessel coronary artery disease, vein graft disease, and ischemic mitral regurgitation.  The patient's cardiac history dates back to 2007 when he originally presented with hypertensive crisis with severe hypertension. He was found to have renal artery stenosis and left main and three-vessel coronary artery disease.  He underwent coronary artery bypass grafting 4 with grafts placed at time of surgery including left internal mammary artery to the distal left anterior descending coronary artery, saphenous vein graft to the ramus intermediate branch, and sequential saphenous vein graft to the posterior descending coronary artery and the right posterior lateral branch. Postoperatively he developed atrial fibrillation which ultimately required cardioversion. His postoperative recovery was otherwise uneventful.   He did well until September 2016 when he presented with acute respiratory failure secondary to acute pulmonary edema from congestive heart failure. At the time he was traveling in [place].  He was hospitalized there where by report echocardiogram revealed ejection fraction 35% with moderate to severe mitral regurgitation.   He underwent cardiac catheterization and was found to have 100% occlusion of all of the main native coronary arteries with continued patency of the left internal mammary artery graft to the distal left anterior descending coronary artery, 100% occlusion of the vein graft to the left circumflex system, and 90% stenosis of an otherwise patent vein graft to the right coronary system. He underwent PCI and stenting of the vein graft to the right coronary system using a drug eluting stent.  Since then the patient has had persistent symptoms of chronic combined systolic and congestive heart failure, and he has been hospitalized on 2 different occasions in [place] for acute exacerbations of class IV symptoms.  The patient was recently seen in follow-up by Dr. Sallyanne Kuster who performed transesophageal echocardiogram on 06/15/2016.  TEE revealed moderate to severe left ventricular chamber enlargement with ejection fraction estimated 35-40%. There was severe hypokinesis of the anterolateral and inferolateral myocardium. There was akinesis of the basal mid inferior myocardium. There was severe mitral regurgitation with an eccentric jet of regurgitation directed posteriorly and flow reversal in the pulmonary veins. There was moderate left atrial enlargement. Left and right heart catheterization was performed 06/21/2016 by Dr. Burt Knack. This confirmed the presence of severe left main and three-vessel coronary artery disease with 100% occlusion of all of the native artery arteries. There was continued patency of the left internal mammary artery to the distal left anterior descending coronary artery, although severe disease in the native left anterior descending coronary artery beyond the distal insertion of the graft. There was chronic occlusion of vein graft to the left circumflex system. There was continued patency of vein graft to the right coronary  system with high-grade 99% stenosis in the midportion of the graft at the  site of previous stent insertion. There was mild to moderate pulmonary hypertension with very large V waves on wedge tracing consistent with severe mitral regurgitation. The patient was referred for surgical consultation.  The patient is married and lives in [place] with his wife. He has been retired since 2000. He has remained fairly active physically until the past year where he has been severely limited by exertional shortness of breath. He states that ever since he became ill and was hospitalized in September 2016 he has been severely limited by exertional shortness of breath. He gets short of breath with very mild activity. He still tries to go to cardiac rehabilitation in exercise, but he has to stop and take breaks frequently. He has had intervening episodes of shortness of breath at rest, PND, and orthopnea. He has been hospitalized twice with acute exacerbations as previously noted, most recently in July of this year. He has had epigastric chest discomfort which he describes as "heartburn-like" symptoms that occur sporadically and are alleviated by administration of aspirin. Symptoms occur as often as once a week and usually resolve within 10-15 minutes. They are not associated with physical activity. The patient has not had palpitations, dizzy spells, or syncope. His appetite is poor and he has lost more than 30 pounds of weight. He has a persistent dry nonproductive cough.  Patient returns to the office today for follow-up of severe left main and three-vessel coronary artery disease, vein graft disease status post coronary artery bypass grafting in 2007, and severe symptomatic secondary mitral regurgitation with symptoms consistent with unstable angina pectoris and chronic combined systolic and diastolic congestive heart failure. He was originally seen in consultation on 06/22/2016.  Shortly after that the patient made a decision to proceed with elective high risk redo coronary artery bypass  grafting and mitral valve repair or replacement.  He returns to the office today with plans to proceed with surgery tomorrow. He reports no new problems or complaints over the last few weeks. He has been careful not to exert himself to strenuously. He has not had any more symptoms of substernal chest pain or chest tightness. He gets short of breath with moderate level activity but he has been quite comfortable at rest. Appetite is good. Bowel function is normal. He denies any fevers or chills. He has not had a productive cough. He stopped taking Plavix last week in anticipation of surgery.    Past Medical History:  Diagnosis Date  . Anginal pain ([place])   . CAD (coronary artery disease)   . Coronary artery disease involving coronary bypass graft   . Cyst of neck    right side  . DM2 (diabetes mellitus, type 2) ([place]) 08/26/2013  . Dyspnea   . Heart attack   . HTN (hypertension) 08/26/2013  . Hyperlipidemia 08/26/2013  . Left main coronary artery disease   . Left renal artery stenosis ([place])    Genesis 6x12 stent 2007  . Obesity (BMI 30.0-34.9) 08/26/2013  . Postoperative atrial fibrillation ([place]) 10/15/2005  . S/P CABG x 4 10/13/2005   LIMA to LAD, SVG to intermediate branch, sequential SVG to PDA and RPL branch, EVH via right thigh    Past Surgical History:  Procedure Laterality Date  . CARDIAC CATHETERIZATION N/A 06/21/2016   Procedure: Right/Left Heart Cath and Coronary/Graft Angiography;  Surgeon: Sherren Mocha, MD;  Location: [facility];  Service: Cardiovascular;  Laterality: N/A;  .  CORONARY ARTERY BYPASS GRAFT  10/13/2005   LIMA to LAD, SVG to intermediate branch, sequential SVG to PDA and RPL  . MYOCARDICAL PERFUSION  10/09/2007   NORMAL PERFUSION IN ALL REGIONS;NO EVIDENCE OF INDUCIBLE ISCHEMIA;POST STRESS EF% 66  . PERCUTANEOUS CORONARY STENT INTERVENTION (PCI-S)     DES in SVG to right coronary artery system  . RENAL ARTERY STENT Right 2007  . RENAL DOPPLER  03/28/2010    RIGHT RA-NORMAL;LEFT PROXIMAL RA AT STENT-PATENT WITH NO EVIDENCE OF SIGN DIAMETER REDUCTION. R & L KIDNEYS: EQUAL IN SIZE,SYMMETRICAL IN SHAPE.  . TEE WITHOUT CARDIOVERSION N/A 06/15/2016   Procedure: TRANSESOPHAGEAL ECHOCARDIOGRAM (TEE);  Surgeon: Sanda Klein, MD;  Location: [facility];  Service: Cardiovascular;  Laterality: N/A;  . TRANSESOPHAGEAL ECHOCARDIOGRAM  10/19/2005   NORMAL LV; MILD TO MODERATE AMOUNT OF SOFT ATHEROMATOUS PLAQUE OF THE THORACIC AORTA; THE LEFT ATRIUM IS MILDLY DILATED;LEFT ATRIAL APPENDAGE FUNCTION IS NORMAL;NO THROMBUS IDENTIFIED. SMALL PFO WITH RIGHT TO LEFT SHUNT    Family History  Problem Relation Age of Onset  . Heart attack Mother   . Heart disease Mother   . Heart attack Father   . Heart disease Father   . Hypertension Father   . Hypertension Sister   . Heart disease Maternal Grandmother     Social History Social History  Substance Use Topics  . Smoking status: Never Smoker  . Smokeless tobacco: Never Used  . Alcohol use No    Prior to Admission medications   Medication Sig Start Date End Date Taking? Authorizing Provider  amLODipine (NORVASC) 2.5 MG tablet Take 1 tablet (2.5 mg total) by mouth daily. Patient taking differently: Take 2.5 mg by mouth every evening.  07/28/15  Yes Mihai Croitoru, MD  aspirin EC 81 MG tablet Take 81 mg by mouth every evening.   Yes Historical Provider, MD  carvedilol (COREG) 25 MG tablet Take 1 tablet (25 mg total) by mouth 2 (two) times daily. 07/28/15  Yes Mihai Croitoru, MD  clopidogrel (PLAVIX) 75 MG tablet TAKE ONE TABLET BY MOUTH ONCE DAILY 04/19/16  Yes Mihai Croitoru, MD  furosemide (LASIX) 40 MG tablet Take 1 tablet (40 mg total) by mouth 2 (two) times daily. 06/08/16  Yes Mihai Croitoru, MD  lisinopril (PRINIVIL,ZESTRIL) 40 MG tablet Take 1 tablet (40 mg total) by mouth daily. Patient taking differently: Take 40 mg by mouth every evening.  03/08/16  Yes Mihai Croitoru, MD  metFORMIN (GLUCOPHAGE) 500 MG  tablet Take 1 tablet (500 mg total) by mouth 2 (two) times daily with a meal. 06/22/16  Yes Cassandria Anger, MD  potassium chloride (K-DUR,KLOR-CON) 10 MEQ tablet Take 2 tablets (20 mEq total) by mouth 2 (two) times daily. 06/08/16  Yes Mihai Croitoru, MD  rosuvastatin (CRESTOR) 10 MG tablet Take 1 tablet (10 mg total) by mouth daily. Patient taking differently: Take 10 mg by mouth every evening.  07/28/15  Yes Mihai Croitoru, MD  glucose blood (ACCU-CHEK AVIVA) test strip Use as instructed bid E11.65 06/07/16   Cassandria Anger, MD    Allergies  Allergen Reactions  . Xanax [Alprazolam] Other (See Comments)    Pt feels very weak, tired and feels paralyzed       Review of Systems:              General:                      poor appetite, decreased energy, no weight gain, + weight loss,  no fever             Cardiac:                       no chest pain with exertion, + chest pain at rest, + SOB with exertion, + occasional resting SOB, + PND, + orthopnea, no palpitations, no arrhythmia, no atrial fibrillation, no LE edema, no dizzy spells, no syncope             Respiratory:                 + shortness of breath, no home oxygen, no productive cough, + dry cough, no bronchitis, no wheezing, no hemoptysis, no asthma, no pain with inspiration or cough, no sleep apnea, no CPAP at night             GI:                               no difficulty swallowing, no reflux, no frequent heartburn, no hiatal hernia, no abdominal pain, + constipation, no diarrhea, no hematochezia, no hematemesis, no melena             GU:                              no dysuria,  no frequency, no urinary tract infection, no hematuria, no enlarged prostate, no kidney stones, no kidney disease             Vascular:                     no pain suggestive of claudication, no pain in feet, no leg cramps, no varicose veins, no DVT, no non-healing foot ulcer             Neuro:                         no stroke, no TIA's, no  seizures, no headaches, no temporary blindness one eye,  no slurred speech, no peripheral neuropathy, no chronic pain, no instability of gait, no memory/cognitive dysfunction             Musculoskeletal:         no arthritis, no joint swelling, no myalgias, no difficulty walking, normal mobility              Skin:                            no rash, no itching, no skin infections, no pressure sores or ulcerations             Psych:                         + anxiety, + depression, no nervousness, no unusual recent stress             Eyes:                           no blurry vision, no floaters, no recent vision changes, + wears glasses or contacts             ENT:                            +  hearing loss, no loose or painful teeth, no dentures, last saw dentist 06/20/2016             Hematologic:               no easy bruising, no abnormal bleeding, no clotting disorder, no frequent epistaxis             Endocrine:                   + diabetes, does not routinely check CBG's at home but most recent Hgb a1c < 6.0                           Physical Exam:              BP 128/78   Pulse 66   Resp 16   Ht 6' (1.829 m)   Wt 178 lb (80.7 kg)   SpO2 99% Comment: ON RA  BMI 24.14 kg/m              General:                        well-appearing             HEENT:                       Unremarkable except for large cystic mass right side of neck             Neck:                           no JVD, no bruits, no adenopathy              Chest:                          clear to auscultation, symmetrical breath sounds, no wheezes, no rhonchi, sternum stable, previous sternotomy scar             CV:                              RRR, grade IV/VI holosystolic murmur best at apex              Abdomen:                    soft, non-tender, no masses              Extremities:                 warm, well-perfused, pulses diminished, no LE edema, small scar right distal thigh from previous EVH              Rectal/GU                   Deferred             Neuro:                         Grossly non-focal and symmetrical throughout             Skin:  Clean and dry, no rashes, no breakdown   Diagnostic Tests:  Transesophageal Echocardiography  Patient: Nequan, Franchina MR #: [id number] Study Date: 06/15/2016 Gender: M Age: 58 Height: 182.9 cm Weight: 82 kg BSA: 2.05 m^2 Pt. Status: Room:  SONOGRAPHER Florentina Jenny, [place] Croitoru, MD PERFORMING Sanda Klein, MD  cc:  ------------------------------------------------------------------- LV EF: 35% - 40%  ------------------------------------------------------------------- Indications: Mitral regurgitation 424.0.  ------------------------------------------------------------------- Study Conclusions  - Left ventricle: The cavity size was moderately to severely dilated. Systolic function was moderately reduced. The estimated ejection fraction was in the range of 35% to 40%. Severe hypokinesis of the anterolateral and inferolateral myocardium; consistent with infarction in the distribution of the left circumflex coronary artery. Akinesis of the basal-midinferior myocardium; consistent with infarction in the distribution of the right coronary artery. The basal anterior wall is hypokinetic. The apical segments of the anterior and inferior wall and the septum contract normally, consistent with occluded native arteries and patent distal grafts. - Aortic valve: No evidence of vegetation. - Mitral valve: There was severe regurgitation directed eccentrically and posteriorly. Severe regurgitation is suggested by pulmonary vein systolic flow reversal. - Left atrium: The atrium was moderately dilated. No evidence of thrombus in the atrial cavity or appendage. No evidence of thrombus. - Right atrium: No evidence  of thrombus in the atrial cavity or appendage. - Atrial septum: No defect or patent foramen ovale was identified. - Tricuspid valve: No evidence of vegetation. - Pulmonic valve: No evidence of vegetation.  ------------------------------------------------------------------- Study data: Study status: Routine. Consent: The risks, benefits, and alternatives to the procedure were explained to the patient and informed consent was obtained. Procedure: The patient reported no pain pre or post test. Initial setup. The patient was brought to the laboratory. Surface ECG leads were monitored. Sedation. Conscious sedation was administered by cardiology staff. Transesophageal echocardiography. An adult multiplane transesophageal probe was inserted by the attending cardiologistwithout difficulty. Image quality was adequate. Study completion: The patient tolerated the procedure well. There were no complications. Administered medications: Fentanyl, 50mcg, IV. Midazolam, 2mg , IV. Diagnostic transesophageal echocardiography. 2D and color Doppler. Birthdate: Patient birthdate: 01/12/1946. Age: Patient is 70 yr old. Sex: Gender: male. BMI: 24.5 kg/m^2. Blood pressure: 138/55 Patient status: Outpatient. Study date: Study date: 06/15/2016. Study time: 01:34 PM. Location: Endoscopy.  -------------------------------------------------------------------  ------------------------------------------------------------------- Left ventricle: The cavity size was moderately to severely dilated. Systolic function was moderately reduced. The estimated ejection fraction was in the range of 35% to 40%. Regional wall motion abnormalities: Severe hypokinesis of the anterolateral and inferolateral myocardium; consistent with infarction in the distribution of the left circumflex coronary artery. Akinesis of the basal-midinferior myocardium; consistent with infarction in  the distribution of the right coronary artery.  ------------------------------------------------------------------- Aortic valve: Structurally normal valve. Cusp separation was normal. No evidence of vegetation. Doppler: There was no regurgitation.  ------------------------------------------------------------------- Aorta: The aorta was normal. Descending aorta: The descending aorta had moderate diffuse disease.  ------------------------------------------------------------------- Mitral valve: Mildly thickened leaflets . There is malcoaptation due to ischemic tethering of the posterior leaflet, most obvious at the A2P2 and A3P3 commissure. Doppler: There was severe regurgitation directed eccentrically and posteriorly. Severe regurgitation is suggested by pulmonary vein systolic flow reversal.  ------------------------------------------------------------------- Left atrium: The atrium was moderately dilated. No evidence of thrombus in the atrial cavity or appendage. No evidence of thrombus. The appendage was of normal size. Emptying velocity was normal.  ------------------------------------------------------------------- Atrial septum: No defect or patent foramen ovale was identified.  ------------------------------------------------------------------- Right ventricle: The cavity size was normal.  Wall thickness was normal. Systolic function was normal.  ------------------------------------------------------------------- Pulmonic valve: Structurally normal valve. Cusp separation was normal. No evidence of vegetation. Doppler: There was trivial regurgitation.  ------------------------------------------------------------------- Tricuspid valve: Structurally normal valve. Leaflet separation was normal. No evidence of vegetation. Doppler: There was trivial  regurgitation.  ------------------------------------------------------------------- Right atrium: The atrium was normal in size. No evidence of thrombus in the atrial cavity or appendage.  ------------------------------------------------------------------- Pericardium: There was no pericardial effusion.  ------------------------------------------------------------------- Post procedure conclusions Ascending Aorta:  - The aorta was normal.  ------------------------------------------------------------------- Measurements  Left ventricle Value Reference LV ID, ED, PLAX maximal (H) 76.81 mm 36 - 54 LV ID, ES, PLAX maximal (H) 65.09 mm 23 - 40 LV fx shortening, PLAX maximal (L) 15 % >=29  Mitral valve Value Reference Mitral regurg vena contracta 4.39 mm --------- Aliasing velocity, MR PISA 38.5 cm/s --------- Mitral regurg PISA radius 10 mm --------- Mitral maximal regurg velocity, PISA 754 cm/s --------- Mitral regurg VTI, PISA 243 cm --------- Mitral ERO, PISA 0.32 cm^2 --------- Mitral regurg volume, PISA 78 ml ---------  Legend: (L) and (H) mark values outside specified reference range.  ------------------------------------------------------------------- Prepared and Electronically Authenticated by  Sanda Klein, MD 2017-09-14T16:14:15   Right/Left Heart Cath and Coronary/Graft Angiography  Conclusion   1. Severe left main and three-vessel coronary artery disease 2. Status post aortocoronary bypass surgery with continued patency of the LIMA to LAD, chronic occlusion of the saphenous vein graft to diagonal/intermediate, and patency of the saphenous vein graft to PDA with severe stenosis in the mid body of the graft at  a site of previous stent insertion 3. Known severe mitral regurgitation with giant V waves in the pulmonary wedge tracing  Patient is scheduled to see Dr. Roxy Manns tomorrow for his cardiac surgical evaluation for consideration of redo CABG/mitral valve repair or replacement  Indications   Severe mitral regurgitation [I34.0 (ICD-10-CM)]  Procedural Details/Technique   Technical Details INDICATION: Severe mitral regurgitation, known coronary artery disease status post CABG, preoperative assessment.  PROCEDURAL DETAILS: There was an indwelling IV in a left antecubital vein. Using normal sterile technique, the IV was changed out for a 5/6 Fr brachial sheath over a 0.018 inch wire. The left wrist was then prepped, draped, and anesthetized with 1% lidocaine. Using the modified Seldinger technique a 5/6 French Slender sheath was placed in the left radial artery. Intra-arterial verapamil was administered through the radial artery sheath. IV heparin was administered after a JR4 catheter was advanced into the central aorta. A Swan-Ganz catheter was used for the right heart catheterization. Standard protocol was followed for recording of right heart pressures and sampling of oxygen saturations. Fick cardiac output was calculated. Standard Judkins catheters were used for selective coronary angiography and bypass graft angiography. There were no immediate procedural complications. The patient was transferred to the post catheterization recovery area for further monitoring.  During this procedure the patient is administered a total of Versed 2 mg and Fentanyl 25 mg to achieve and maintain moderate conscious sedation. The patient's heart rate, blood pressure, and oxygen saturation are monitored continuously during the procedure. The period of conscious sedation is 43 minutes, of which I was present face-to-face 100% of this time.   Estimated blood loss <50 mL. .    Coronary Findings   Dominance: Right  Left  Main  Ost LM lesion, 90% stenosed. The distal left main has severe 90% stenosis  Left Anterior Descending  Prox LAD to Mid LAD lesion, 90% stenosed. There is diffuse 80-90%  stenosis throughout the proximal LAD. There is a large septal cascade that arises from this area.  Dist LAD lesion, 100% stenosed.  First Diagonal Branch  The diagonal branches fill late from left to left collaterals. The branch appears small in caliber but is probably underfilled and there are 3 distinct subbranches visualized. 1st Diag filled by collaterals from 1st Mrg.  Ost 1st Diag to 1st Diag lesion, 100% stenosed.  Left Circumflex  Ost Cx lesion, 80% stenosed. The circumflex has tight ostial stenosis. The vessel is occluded in its midportion.  Mid Cx lesion, 100% stenosed.  Right Coronary Artery  Prox RCA lesion, 100% stenosed.  First Right Posterolateral  1st RPLB lesion, 80% stenosed. There is a tight lesion in the posterior AV segment leading into the first PLA branch  Graft Angiography  saphenous Graft to RPDA  SVG. This graft as a sequential to the PDA and PLA, but I can only visualize the PDA limb of the graft.  Mid Graft lesion, 95% stenosed. The lesion was previously treated. There is severe stenosis in the mid body of the graft to the PDA. This is probably in-stent restenosis, but the stent is very difficult to visualize.  LIMA Graft to Mid LAD  The LIMA to LAD is widely patent. Beyond the LIMA insertion there is competitive filling. The apical LAD is difficult to visualize and may be totally occluded  Graft to 1st Diag  Prox Graft lesion, 100% stenosed.  Coronary Diagrams   Diagnostic Diagram     Implants        No implant documentation for this case.  PACS Images   Show images for Cardiac catheterization   Link to Procedure Log   Procedure Log    Hemo Data   Flowsheet Row Most Recent Value  Fick Cardiac Output 5.04 L/min  Fick Cardiac Output Index 2.47 (L/min)/BSA  RA A Wave 3  mmHg  RA V Wave 3 mmHg  RA Mean 1 mmHg  RV Systolic Pressure 49 mmHg  RV Diastolic Pressure -1 mmHg  RV EDP 6 mmHg  PA Systolic Pressure 50 mmHg  PA Diastolic Pressure 18 mmHg  PA Mean 34 mmHg  PW A Wave 24 mmHg  PW V Wave 46 mmHg  PW Mean 27 mmHg  AO Systolic Pressure 123456 mmHg  AO Diastolic Pressure 65 mmHg  AO Mean 89 mmHg  LV Systolic Pressure 123XX123 mmHg  LV Diastolic Pressure 5 mmHg  LV EDP 24 mmHg  Arterial Occlusion Pressure Extended Systolic Pressure 123456 mmHg  Arterial Occlusion Pressure Extended Diastolic Pressure 65 mmHg  Arterial Occlusion Pressure Extended Mean Pressure 89 mmHg  Left Ventricular Apex Extended Systolic Pressure 123XX123 mmHg  Left Ventricular Apex Extended Diastolic Pressure 6 mmHg  Left Ventricular Apex Extended EDP Pressure 25 mmHg  QP/QS 1  TPVR Index 13.76 HRUI  TSVR Index 36.04 HRUI  PVR SVR Ratio 0.08  TPVR/TSVR Ratio 0.38     STS Risk Calculator  Procedure                                          Redo CABG + MVR  Risk of Mortality                                7.8% Morbidity or Mortality  37.6% Prolonged LOS                                   16.7% Short LOS                                           12.7% Permanent Stroke                             2.2% Prolonged Vent Support                     26.8% DSW Infection                                     0.8% Renal Failure                                       13.3% Reoperation                                        13.3%   CT CHEST WITHOUT CONTRAST  TECHNIQUE: Multidetector CT imaging of the chest was performed following the standard protocol without IV contrast.  COMPARISON: No prior chest CT.  FINDINGS: Cardiovascular: Heart size is enlarged with left ventricular and left atrial dilatation. There is no significant pericardial fluid, thickening or pericardial calcification. There is aortic atherosclerosis, as well as atherosclerosis of the great  vessels of the mediastinum and the coronary arteries, including calcified atherosclerotic plaque in the left main, left anterior descending, left circumflex and right coronary arteries. Relatively minimal calcified atherosclerotic plaque in the ascending thoracic aorta. Status post median sternotomy for CABG, including LIMA to the LAD. Dilatation of the pulmonic trunk (4.1 cm in diameter), suggestive of underlying pulmonary arterial hypertension.  Mediastinum/Nodes: No pathologically enlarged mediastinal or hilar lymph nodes. Please note that accurate exclusion of hilar adenopathy is limited on noncontrast CT scans. Esophagus is unremarkable in appearance. No axillary lymphadenopathy.  Lungs/Pleura: No suspicious appearing pulmonary nodules or masses. There is no acute consolidative airspace disease. No pleural effusions. A few areas of mild linear scarring are noted, predominantly in the lung bases.  Upper Abdomen: Low-attenuation lesions in the upper pole of the left kidney are incompletely characterized on today's noncontrast CT examination, but were previously characterized as simple cysts on prior abdominal MRI 07/23/2015. Aortic atherosclerosis.  Musculoskeletal: Median sternotomy wires. There are no aggressive appearing lytic or blastic lesions noted in the visualized portions of the skeleton.  IMPRESSION: 1. Cardiomegaly with left ventricular and left atrial dilatation. 2. Dilatation of the pulmonic trunk, suggestive of underlying pulmonary arterial hypertension. 3. Aortic atherosclerosis, in addition to left main and 3 vessel coronary artery disease. Status post median sternotomy for CABG, including LIMA to the LAD. Notably, there is relatively little calcified atherosclerotic plaque in the ascending thoracic aorta (which may be relevant to potential cross clamp procedure). 4. Additional incidental findings, as above.   Electronically Signed By: Vinnie Langton M.D. On: 07/07/2016 14:40    Impression:  Patient has critical left main disease, three-vessel coronary artery disease, and progressive vein graft disease status post coronary artery bypass grafting in 2007 with severe ischemic cardiomyopathy and severe symptomatic secondary (ischemic) mitral regurgitation. He presents with chronic symptoms of exertional shortness of breath and fatigue consistent with chronic combined systolic and diastolic congestive heart failure, [place] Heart Association functional class III andfrequent recurrent episodes of acute exacerbations of class IV symptoms. The patient also describes recurrent episodes of burning substernal chest pain consistent with unstable angina pectoris. I have personally reviewed the patient's recent transesophageal echocardiogram and diagnostic cardiac catheterization. TEE confirmed the presence of severe left ventricular systolic dysfunction with multiple wall motion abnormalities consistent with multiple previous transmural myocardial infarctions and moderate to severe left ventricular chamber enlargement. Ejection fraction was estimated 35% but this is in the setting of severe (4+) mitral regurgitation. The patient has classical type IIIB dysfunction of the mitral valve with severe ischemic mitral regurgitation. Diagnostic cardiac catheterization reveals findings as noted above. All 3 main native coronary arteries are 100% chronically occluded with minimal perfusion from the right coronary system and perfusion from the left main involving only the high interventricular septum. There is continued patency of the left internal mammary artery to the distal left anterior descending coronary artery but diffuse disease in the native left anterior descending coronary artery with subtotal occlusion beyond the distal insertion of the left internal mammary artery graft. There is continued patency of the vein graft to the posterior descending  coronary artery but critical high-grade restenosis in the midportion of this vein graft at the site of previous stent placement performed one year ago. The vein graft to the circumflex system is chronically occluded. Right heart catheterization was notable for only moderate pulmonary hypertension,low central venous pressure and preserved cardiac output.  Noncontrast CT scan of the chest demonstrates diffuse atherosclerotic disease with calcification of the distal transverse aorta and descending thoracic aorta, but the ascending thoracic aorta appears relatively free of significant disease.   Plan:  I have again reviewed the indications, risks, and potential benefits of redo coronary artery bypass grafting with mitral valve repair or replacement with the patient and his wife in the office today. Alternative treatment strategies to been discussed. Long-term prognosis with medical therapy has been discussed. The relatively high risks associated with surgery have been discussed.   The likelihood of successful and durable valve repair has been discussed with particular reference to the findings of their recent echocardiogram.  Based upon these findings and previous experience, I have quoted them a 50 percent likelihood of successful valve repair.  In the unlikely event that their valve cannot be successfully repaired, we discussed the possibility of replacing the mitral valve using a mechanical prosthesis with the attendant need for long-term anticoagulation versus the alternative of replacing it using a bioprosthetic tissue valve with its potential for late structural valve deterioration and failure, depending upon the patient's longevity.  The patient specifically requests that if the mitral valve must be replaced that it be done using a bioprosthetic tissue valve.   The patient understands and accepts all potential risks of surgery including but not limited to risk of death, stroke or other neurologic  complication, myocardial infarction, congestive heart failure, respiratory failure, renal failure, bleeding requiring transfusion and/or reexploration, arrhythmia, infection or other wound complications, pneumonia, pleural and/or pericardial effusion, pulmonary embolus, aortic dissection or other major vascular complication, or delayed complications related to valve repair or replacement including but not limited to recurrent  ischemic heart disease, structural valve deterioration and failure, thrombosis, embolization, endocarditis, or paravalvular leak.  The possibility of need for short or long-term mechanical circulatory assist support in the event of postoperative cardiac failure have been discussed. All of their questions have been answered.   I spent in excess of 30 minutes during the conduct of this office consultation and >50% of this time involved direct face-to-face encounter with the patient for counseling and/or coordination of their care.   Valentina Gu. Roxy Manns, MD 07/10/2016 1:59 PM

## 2016-07-10 NOTE — Progress Notes (Signed)
Anesthesia Chart Review: Patient is a 70 year old male scheduled for redo CABG, MV repair versus replacement on 07/11/16 by Dr. Roxy Manns. See Dr. Guy Sandifer 06/22/16 Consult note for details regarding history and cardiac testing up to that date. He had a CABG (LIMA-LAD, SVG-ramus, SVG-PDA-PLA) in 2007 and DES to SVG-RCA (SVG-ramus occluded) 06/2015 [facility], [place]), post-operative afib s/p cardioversion, ischemic cardiomyopathy, systolic CHF, severe MR, left renal artery stenosis s/p stent '07, DM2.  PCP is Dr. Sharilyn Sites. Cardiologist is Dr. Sallyanne Kuster.  Meds include amlodipine, ASA 81 mg, Coreg, Plavix (held since 07/03/16), Lasix, lisinopril, metformin, KCl, Crestor.  BP 132/72   Pulse 61   Temp 36.3 C   Resp 20   Ht 6' (1.829 m)   Wt 178 lb 1 oz (80.8 kg)   SpO2 100%   BMI 24.15 kg/m   07/07/16 Carotid U/S (Preliminary): Bilateral:  1-39% ICA stenosis.  Vertebral artery flow is antegrade.   07/07/16 PFTs: FVC 4.15 (89%), FEV1 2.98 (86%), DLCO unc 22.79 (67%).  Preoperative EKG, CXR, and labs noted. Cr 1.16. H/H 11.6/37.3. PT/INR WNL. PTT 41. A1c 6.0. UA trace leukocytes, but negative nitrites.  If no acute changes then I anticipate that he can proceed as planned.  George Hugh [facility] Stay [facility]/Anesthesiology Phone [phone] 07/10/2016 11:06 AM

## 2016-07-10 NOTE — Patient Instructions (Signed)
   Patient should continue taking all current medications (except Plavix) without change through the day before surgery.  Patient should have nothing to eat or drink after midnight the night before surgery.  On the morning of surgery patient should take only carvedilol (Coreg) with a sip of water.

## 2016-07-10 NOTE — Progress Notes (Signed)
[address]       [place] [address]             [phone]     CARDIOTHORACIC SURGERY OFFICE NOTE  Referring Provider is Croitoru, Dani Gobble, MD PCP is Purvis Kilts, MD   HPI:  Patient returns to the office today for follow-up of severe left main and three-vessel coronary artery disease, vein graft disease status post coronary artery bypass grafting in 2007, and severe symptomatic secondary mitral regurgitation with symptoms consistent with unstable angina pectoris and chronic combined systolic and diastolic congestive heart failure. He was originally seen in consultation on 06/22/2016.  Shortly after that the patient made a decision to proceed with elective high risk redo coronary artery bypass grafting and mitral valve repair or replacement.  He returns to the office today with plans to proceed with surgery tomorrow. He reports no new problems or complaints over the last few weeks. He has been careful not to exert himself to strenuously. He has not had any more symptoms of substernal chest pain or chest tightness. He gets short of breath with moderate level activity but he has been quite comfortable at rest. Appetite is good. Bowel function is normal. He denies any fevers or chills. He has not had a productive cough. He stopped taking Plavix last week in anticipation of surgery.   Current Outpatient Prescriptions  Medication Sig Dispense Refill  . amLODipine (NORVASC) 2.5 MG tablet Take 1 tablet (2.5 mg total) by mouth daily. (Patient taking differently: Take 2.5 mg by mouth every evening. ) 180 tablet 3  . aspirin EC 81 MG tablet Take 81 mg by mouth every evening.    . carvedilol (COREG) 25 MG tablet Take 1 tablet (25 mg total) by mouth 2 (two) times daily. 180 tablet 3  . clopidogrel (PLAVIX) 75 MG tablet TAKE ONE TABLET BY MOUTH ONCE DAILY 90 tablet 3  . furosemide (LASIX) 40 MG tablet Take 1 tablet (40 mg total) by mouth 2 (two) times daily. 180 tablet 3    . glucose blood (ACCU-CHEK AVIVA) test strip Use as instructed bid E11.65 100 each 5  . lisinopril (PRINIVIL,ZESTRIL) 40 MG tablet Take 1 tablet (40 mg total) by mouth daily. (Patient taking differently: Take 40 mg by mouth every evening. ) 90 tablet 3  . metFORMIN (GLUCOPHAGE) 500 MG tablet Take 1 tablet (500 mg total) by mouth 2 (two) times daily with a meal. 180 tablet 1  . potassium chloride (K-DUR,KLOR-CON) 10 MEQ tablet Take 2 tablets (20 mEq total) by mouth 2 (two) times daily. 360 tablet 3  . rosuvastatin (CRESTOR) 10 MG tablet Take 1 tablet (10 mg total) by mouth daily. (Patient taking differently: Take 10 mg by mouth every evening. ) 90 tablet 3   No current facility-administered medications for this visit.    Facility-Administered Medications Ordered in Other Visits  Medication Dose Route Frequency Provider Last Rate Last Dose  . [START ON 07/11/2016] 0.9 %  sodium chloride infusion   Intravenous To SS-Surg Rexene Alberts, MD      . Derrill Memo ON 07/11/2016] cefUROXime (ZINACEF) 1.5 g in dextrose 5 % 50 mL IVPB  1.5 g Intravenous To OR Rexene Alberts, MD      . Derrill Memo ON 07/11/2016] cefUROXime (ZINACEF) 750 mg in dextrose 5 % 50 mL IVPB  750 mg Intravenous To OR Rexene Alberts, MD      . Derrill Memo ON 07/11/2016] chlorhexidine (PERIDEX) 0.12 %  solution 15 mL  15 mL Mouth/Throat To SS-Surg Rexene Alberts, MD      . Derrill Memo ON 07/11/2016] dexmedetomidine (PRECEDEX) 400 MCG/100ML (4 mcg/mL) infusion  0.1-0.7 mcg/kg/hr Intravenous To OR Rexene Alberts, MD      . Derrill Memo ON 07/11/2016] DOPamine (INTROPIN) 800 mg in dextrose 5 % 250 mL (3.2 mg/mL) infusion  0-10 mcg/kg/min Intravenous To OR Rexene Alberts, MD      . Derrill Memo ON 07/11/2016] EPINEPHrine (ADRENALIN) 4 mg in dextrose 5 % 250 mL (0.016 mg/mL) infusion  0-10 mcg/min Intravenous To OR Rexene Alberts, MD      . Derrill Memo ON 07/11/2016] glutaraldehyde 0.625% soaking solution   Topical PRN Rexene Alberts, MD      . Derrill Memo ON 07/11/2016]  heparin 2,500 Units, papaverine 30 mg in electrolyte-148 (PLASMALYTE-148) 500 mL irrigation   Irrigation To OR Rexene Alberts, MD      . Derrill Memo ON 07/11/2016] heparin 30,000 units/NS 1000 mL solution for CELLSAVER   Other To OR Rexene Alberts, MD      . Derrill Memo ON 07/11/2016] insulin regular (NOVOLIN R,HUMULIN R) 250 Units in sodium chloride 0.9 % 250 mL (1 Units/mL) infusion   Intravenous To OR Rexene Alberts, MD      . Derrill Memo ON 07/11/2016] magnesium sulfate (IV Push/IM) injection 40 mEq  40 mEq Other To OR Rexene Alberts, MD      . Derrill Memo ON 07/11/2016] metoprolol tartrate (LOPRESSOR) tablet 12.5 mg  12.5 mg Oral To SS-Surg Rexene Alberts, MD      . Derrill Memo ON 07/11/2016] nitroGLYCERIN 50 mg in dextrose 5 % 250 mL (0.2 mg/mL) infusion  2-200 mcg/min Intravenous To OR Rexene Alberts, MD      . Derrill Memo ON 07/11/2016] phenylephrine (NEO-SYNEPHRINE) 20 mg in dextrose 5 % 250 mL (0.08 mg/mL) infusion  30-200 mcg/min Intravenous To OR Rexene Alberts, MD      . Derrill Memo ON 07/11/2016] potassium chloride injection 80 mEq  80 mEq Other To OR Rexene Alberts, MD      . Derrill Memo ON 07/11/2016] tranexamic acid (CYKLOKAPRON) 2,500 mg in sodium chloride 0.9 % 250 mL (10 mg/mL) infusion  1.5 mg/kg/hr Intravenous To OR Rexene Alberts, MD      . Derrill Memo ON 07/11/2016] tranexamic acid (CYKLOKAPRON) bolus via infusion - over 30 minutes 1,212 mg  15 mg/kg Intravenous To OR Rexene Alberts, MD      . Derrill Memo ON 07/11/2016] tranexamic acid (CYKLOKAPRON) pump prime solution 162 mg  2 mg/kg Intracatheter To OR Rexene Alberts, MD      . Derrill Memo ON 07/11/2016] vancomycin (VANCOCIN) 1,250 mg in sodium chloride 0.9 % 250 mL IVPB  1,250 mg Intravenous To OR Rexene Alberts, MD          Physical Exam:   BP 128/70   Pulse 63   Resp 20   Ht 6' (1.829 m)   Wt 180 lb (81.6 kg)   SpO2 98%   BMI 24.41 kg/m   General:  Well-appearing  Chest:   Clear to auscultation  CV:   Regular rate and rhythm with holosystolic  murmur  Incisions:  n/a  Abdomen:  Soft and nontender  Extremities:  Warm and well-perfused, no edema  Diagnostic Tests:  CT CHEST WITHOUT CONTRAST  TECHNIQUE: Multidetector CT imaging of the chest was performed following the standard protocol without IV contrast.  COMPARISON:  No prior chest CT.  FINDINGS: Cardiovascular: Heart  size is enlarged with left ventricular and left atrial dilatation. There is no significant pericardial fluid, thickening or pericardial calcification. There is aortic atherosclerosis, as well as atherosclerosis of the great vessels of the mediastinum and the coronary arteries, including calcified atherosclerotic plaque in the left main, left anterior descending, left circumflex and right coronary arteries. Relatively minimal calcified atherosclerotic plaque in the ascending thoracic aorta. Status post median sternotomy for CABG, including LIMA to the LAD. Dilatation of the pulmonic trunk (4.1 cm in diameter), suggestive of underlying pulmonary arterial hypertension.  Mediastinum/Nodes: No pathologically enlarged mediastinal or hilar lymph nodes. Please note that accurate exclusion of hilar adenopathy is limited on noncontrast CT scans. Esophagus is unremarkable in appearance. No axillary lymphadenopathy.  Lungs/Pleura: No suspicious appearing pulmonary nodules or masses. There is no acute consolidative airspace disease. No pleural effusions. A few areas of mild linear scarring are noted, predominantly in the lung bases.  Upper Abdomen: Low-attenuation lesions in the upper pole of the left kidney are incompletely characterized on today's noncontrast CT examination, but were previously characterized as simple cysts on prior abdominal MRI 07/23/2015. Aortic atherosclerosis.  Musculoskeletal: Median sternotomy wires. There are no aggressive appearing lytic or blastic lesions noted in the visualized portions of the skeleton.  IMPRESSION: 1.  Cardiomegaly with left ventricular and left atrial dilatation. 2. Dilatation of the pulmonic trunk, suggestive of underlying pulmonary arterial hypertension. 3. Aortic atherosclerosis, in addition to left main and 3 vessel coronary artery disease. Status post median sternotomy for CABG, including LIMA to the LAD. Notably, there is relatively little calcified atherosclerotic plaque in the ascending thoracic aorta (which may be relevant to potential cross clamp procedure). 4. Additional incidental findings, as above.   Electronically Signed   By: Vinnie Langton M.D.   On: 07/07/2016 14:40   Impression:  Patient has critical left main disease, three-vessel coronary artery disease, and progressive vein graft disease status post coronary artery bypass grafting in 2007 with severe ischemic cardiomyopathy and severe symptomatic secondary (ischemic) mitral regurgitation.  He presents with chronic symptoms of exertional shortness of breath and fatigue consistent with chronic combined systolic and diastolic congestive heart failure, New [place] Heart Association functional class III and frequent recurrent episodes of acute exacerbations of class IV symptoms. The patient also describes recurrent episodes of burning substernal chest pain consistent with unstable angina pectoris. I have personally reviewed the patient's recent transesophageal echocardiogram and diagnostic cardiac catheterization. TEE confirmed the presence of severe left ventricular systolic dysfunction with multiple wall motion abnormalities consistent with multiple previous transmural myocardial infarctions and moderate to severe left ventricular chamber enlargement.  Ejection fraction was estimated 35% but this is in the setting of severe (4+) mitral regurgitation.  The patient has classical type IIIB dysfunction of the mitral valve with severe ischemic mitral regurgitation. Diagnostic cardiac catheterization reveals findings as noted  above. All 3 main native coronary arteries are 100% chronically occluded with minimal perfusion from the right coronary system and perfusion from the left main involving only the high interventricular septum. There is continued patency of the left internal mammary artery to the distal left anterior descending coronary artery but diffuse disease in the native left anterior descending coronary artery with subtotal occlusion beyond the distal insertion of the left internal mammary artery graft. There is continued patency of the vein graft to the posterior descending coronary artery but critical high-grade restenosis in the midportion of this vein graft at the site of previous stent placement performed one year ago. The vein graft  to the circumflex system is chronically occluded.  Right heart catheterization was notable for only moderate pulmonary hypertension, low central venous pressure and preserved cardiac output.  Noncontrast CT scan of the chest demonstrates diffuse atherosclerotic disease with calcification of the distal transverse aorta and descending thoracic aorta, but the ascending thoracic aorta appears relatively free of significant disease.   Plan:  I have again reviewed the indications, risks, and potential benefits of redo coronary artery bypass grafting with mitral valve repair or replacement with the patient and his wife in the office today. Alternative treatment strategies to been discussed. Long-term prognosis with medical therapy has been discussed. The relatively high risks associated with surgery have been discussed.   The likelihood of successful and durable valve repair has been discussed with particular reference to the findings of their recent echocardiogram.  Based upon these findings and previous experience, I have quoted them a 50 percent likelihood of successful valve repair.  In the unlikely event that their valve cannot be successfully repaired, we discussed the possibility of  replacing the mitral valve using a mechanical prosthesis with the attendant need for long-term anticoagulation versus the alternative of replacing it using a bioprosthetic tissue valve with its potential for late structural valve deterioration and failure, depending upon the patient's longevity.  The patient specifically requests that if the mitral valve must be replaced that it be done using a bioprosthetic tissue valve.   The patient understands and accepts all potential risks of surgery including but not limited to risk of death, stroke or other neurologic complication, myocardial infarction, congestive heart failure, respiratory failure, renal failure, bleeding requiring transfusion and/or reexploration, arrhythmia, infection or other wound complications, pneumonia, pleural and/or pericardial effusion, pulmonary embolus, aortic dissection or other major vascular complication, or delayed complications related to valve repair or replacement including but not limited to recurrent ischemic heart disease, structural valve deterioration and failure, thrombosis, embolization, endocarditis, or paravalvular leak.  The possibility of need for short or long-term mechanical circulatory assist support in the event of postoperative cardiac failure have been discussed. All of their questions have been answered.   I spent in excess of 30 minutes during the conduct of this office consultation and >50% of this time involved direct face-to-face encounter with the patient for counseling and/or coordination of their care.   Valentina Gu. Roxy Manns, MD 07/10/2016 1:59 PM

## 2016-07-11 ENCOUNTER — Inpatient Hospital Stay (HOSPITAL_COMMUNITY): Payer: Medicare Other

## 2016-07-11 ENCOUNTER — Inpatient Hospital Stay (HOSPITAL_COMMUNITY): Payer: Medicare Other | Admitting: Anesthesiology

## 2016-07-11 ENCOUNTER — Encounter (HOSPITAL_COMMUNITY)
Admission: RE | Disposition: A | Discharge status: Home / Self Care | Payer: Self-pay | Source: Ambulatory Visit | Attending: Thoracic Surgery (Cardiothoracic Vascular Surgery)

## 2016-07-11 ENCOUNTER — Other Ambulatory Visit: Payer: Self-pay

## 2016-07-11 ENCOUNTER — Inpatient Hospital Stay (HOSPITAL_COMMUNITY)
Admission: RE | Admit: 2016-07-11 | Discharge: 2016-08-01 | DRG: 219 | Disposition: A | Discharge status: Home / Self Care | Payer: Medicare Other | Source: Ambulatory Visit | Attending: Thoracic Surgery (Cardiothoracic Vascular Surgery) | Admitting: Thoracic Surgery (Cardiothoracic Vascular Surgery)

## 2016-07-11 ENCOUNTER — Inpatient Hospital Stay (HOSPITAL_COMMUNITY): Payer: Medicare Other | Admitting: Certified Registered Nurse Anesthetist

## 2016-07-11 ENCOUNTER — Encounter (HOSPITAL_COMMUNITY): Payer: Self-pay | Admitting: Urology

## 2016-07-11 DIAGNOSIS — I110 Hypertensive heart disease with heart failure: Secondary | ICD-10-CM | POA: Diagnosis present

## 2016-07-11 DIAGNOSIS — E782 Mixed hyperlipidemia: Secondary | ICD-10-CM | POA: Diagnosis present

## 2016-07-11 DIAGNOSIS — E1165 Type 2 diabetes mellitus with hyperglycemia: Secondary | ICD-10-CM | POA: Diagnosis present

## 2016-07-11 DIAGNOSIS — Z953 Presence of xenogenic heart valve: Secondary | ICD-10-CM

## 2016-07-11 DIAGNOSIS — Z452 Encounter for adjustment and management of vascular access device: Secondary | ICD-10-CM | POA: Diagnosis not present

## 2016-07-11 DIAGNOSIS — E1159 Type 2 diabetes mellitus with other circulatory complications: Secondary | ICD-10-CM | POA: Diagnosis present

## 2016-07-11 DIAGNOSIS — R001 Bradycardia, unspecified: Secondary | ICD-10-CM | POA: Diagnosis not present

## 2016-07-11 DIAGNOSIS — I25810 Atherosclerosis of coronary artery bypass graft(s) without angina pectoris: Secondary | ICD-10-CM | POA: Diagnosis present

## 2016-07-11 DIAGNOSIS — K219 Gastro-esophageal reflux disease without esophagitis: Secondary | ICD-10-CM | POA: Diagnosis present

## 2016-07-11 DIAGNOSIS — I5023 Acute on chronic systolic (congestive) heart failure: Secondary | ICD-10-CM

## 2016-07-11 DIAGNOSIS — I255 Ischemic cardiomyopathy: Secondary | ICD-10-CM | POA: Diagnosis not present

## 2016-07-11 DIAGNOSIS — I4891 Unspecified atrial fibrillation: Secondary | ICD-10-CM | POA: Diagnosis not present

## 2016-07-11 DIAGNOSIS — I97411 Intraoperative hemorrhage and hematoma of a circulatory system organ or structure complicating a cardiac bypass: Secondary | ICD-10-CM | POA: Diagnosis not present

## 2016-07-11 DIAGNOSIS — Z6825 Body mass index (BMI) 25.0-25.9, adult: Secondary | ICD-10-CM

## 2016-07-11 DIAGNOSIS — D62 Acute posthemorrhagic anemia: Secondary | ICD-10-CM | POA: Diagnosis not present

## 2016-07-11 DIAGNOSIS — I340 Nonrheumatic mitral (valve) insufficiency: Principal | ICD-10-CM | POA: Diagnosis present

## 2016-07-11 DIAGNOSIS — E11649 Type 2 diabetes mellitus with hypoglycemia without coma: Secondary | ICD-10-CM | POA: Diagnosis not present

## 2016-07-11 DIAGNOSIS — I252 Old myocardial infarction: Secondary | ICD-10-CM

## 2016-07-11 DIAGNOSIS — I97611 Postprocedural hemorrhage and hematoma of a circulatory system organ or structure following cardiac bypass: Secondary | ICD-10-CM | POA: Diagnosis not present

## 2016-07-11 DIAGNOSIS — D72829 Elevated white blood cell count, unspecified: Secondary | ICD-10-CM | POA: Diagnosis not present

## 2016-07-11 DIAGNOSIS — I5043 Acute on chronic combined systolic (congestive) and diastolic (congestive) heart failure: Secondary | ICD-10-CM | POA: Diagnosis not present

## 2016-07-11 DIAGNOSIS — D689 Coagulation defect, unspecified: Secondary | ICD-10-CM | POA: Diagnosis not present

## 2016-07-11 DIAGNOSIS — Y832 Surgical operation with anastomosis, bypass or graft as the cause of abnormal reaction of the patient, or of later complication, without mention of misadventure at the time of the procedure: Secondary | ICD-10-CM | POA: Diagnosis not present

## 2016-07-11 DIAGNOSIS — I2510 Atherosclerotic heart disease of native coronary artery without angina pectoris: Secondary | ICD-10-CM | POA: Diagnosis present

## 2016-07-11 DIAGNOSIS — R079 Chest pain, unspecified: Secondary | ICD-10-CM | POA: Diagnosis not present

## 2016-07-11 DIAGNOSIS — I4892 Unspecified atrial flutter: Secondary | ICD-10-CM | POA: Diagnosis not present

## 2016-07-11 DIAGNOSIS — I5022 Chronic systolic (congestive) heart failure: Secondary | ICD-10-CM | POA: Diagnosis present

## 2016-07-11 DIAGNOSIS — Z79899 Other long term (current) drug therapy: Secondary | ICD-10-CM

## 2016-07-11 DIAGNOSIS — R1312 Dysphagia, oropharyngeal phase: Secondary | ICD-10-CM | POA: Diagnosis not present

## 2016-07-11 DIAGNOSIS — Z4682 Encounter for fitting and adjustment of non-vascular catheter: Secondary | ICD-10-CM | POA: Diagnosis not present

## 2016-07-11 DIAGNOSIS — Z951 Presence of aortocoronary bypass graft: Secondary | ICD-10-CM

## 2016-07-11 DIAGNOSIS — D6959 Other secondary thrombocytopenia: Secondary | ICD-10-CM | POA: Diagnosis not present

## 2016-07-11 DIAGNOSIS — K224 Dyskinesia of esophagus: Secondary | ICD-10-CM | POA: Diagnosis not present

## 2016-07-11 DIAGNOSIS — Z95818 Presence of other cardiac implants and grafts: Secondary | ICD-10-CM

## 2016-07-11 DIAGNOSIS — E785 Hyperlipidemia, unspecified: Secondary | ICD-10-CM | POA: Diagnosis present

## 2016-07-11 DIAGNOSIS — Z955 Presence of coronary angioplasty implant and graft: Secondary | ICD-10-CM

## 2016-07-11 DIAGNOSIS — E43 Unspecified severe protein-calorie malnutrition: Secondary | ICD-10-CM | POA: Diagnosis not present

## 2016-07-11 DIAGNOSIS — T502X5A Adverse effect of carbonic-anhydrase inhibitors, benzothiadiazides and other diuretics, initial encounter: Secondary | ICD-10-CM | POA: Diagnosis not present

## 2016-07-11 DIAGNOSIS — I97618 Postprocedural hemorrhage and hematoma of a circulatory system organ or structure following other circulatory system procedure: Secondary | ICD-10-CM | POA: Diagnosis not present

## 2016-07-11 DIAGNOSIS — Z23 Encounter for immunization: Secondary | ICD-10-CM

## 2016-07-11 DIAGNOSIS — I483 Typical atrial flutter: Secondary | ICD-10-CM | POA: Diagnosis not present

## 2016-07-11 DIAGNOSIS — I442 Atrioventricular block, complete: Secondary | ICD-10-CM | POA: Diagnosis not present

## 2016-07-11 DIAGNOSIS — J939 Pneumothorax, unspecified: Secondary | ICD-10-CM

## 2016-07-11 DIAGNOSIS — I272 Pulmonary hypertension, unspecified: Secondary | ICD-10-CM | POA: Diagnosis present

## 2016-07-11 DIAGNOSIS — I509 Heart failure, unspecified: Secondary | ICD-10-CM | POA: Diagnosis not present

## 2016-07-11 DIAGNOSIS — I25118 Atherosclerotic heart disease of native coronary artery with other forms of angina pectoris: Secondary | ICD-10-CM | POA: Diagnosis present

## 2016-07-11 DIAGNOSIS — K229 Disease of esophagus, unspecified: Secondary | ICD-10-CM

## 2016-07-11 DIAGNOSIS — F418 Other specified anxiety disorders: Secondary | ICD-10-CM | POA: Diagnosis present

## 2016-07-11 DIAGNOSIS — I129 Hypertensive chronic kidney disease with stage 1 through stage 4 chronic kidney disease, or unspecified chronic kidney disease: Secondary | ICD-10-CM | POA: Diagnosis not present

## 2016-07-11 DIAGNOSIS — Z7982 Long term (current) use of aspirin: Secondary | ICD-10-CM

## 2016-07-11 DIAGNOSIS — J9811 Atelectasis: Secondary | ICD-10-CM | POA: Diagnosis not present

## 2016-07-11 DIAGNOSIS — I10 Essential (primary) hypertension: Secondary | ICD-10-CM | POA: Diagnosis present

## 2016-07-11 DIAGNOSIS — E876 Hypokalemia: Secondary | ICD-10-CM | POA: Diagnosis not present

## 2016-07-11 DIAGNOSIS — Z7902 Long term (current) use of antithrombotics/antiplatelets: Secondary | ICD-10-CM

## 2016-07-11 DIAGNOSIS — I25110 Atherosclerotic heart disease of native coronary artery with unstable angina pectoris: Secondary | ICD-10-CM | POA: Diagnosis present

## 2016-07-11 DIAGNOSIS — R0602 Shortness of breath: Secondary | ICD-10-CM | POA: Diagnosis not present

## 2016-07-11 DIAGNOSIS — E119 Type 2 diabetes mellitus without complications: Secondary | ICD-10-CM | POA: Diagnosis not present

## 2016-07-11 DIAGNOSIS — J90 Pleural effusion, not elsewhere classified: Secondary | ICD-10-CM | POA: Diagnosis not present

## 2016-07-11 DIAGNOSIS — Z7984 Long term (current) use of oral hypoglycemic drugs: Secondary | ICD-10-CM

## 2016-07-11 DIAGNOSIS — I25700 Atherosclerosis of coronary artery bypass graft(s), unspecified, with unstable angina pectoris: Secondary | ICD-10-CM | POA: Diagnosis not present

## 2016-07-11 HISTORY — PX: MITRAL VALVE REPLACEMENT: SHX147

## 2016-07-11 HISTORY — DX: Presence of aortocoronary bypass graft: Z95.1

## 2016-07-11 HISTORY — PX: CORONARY ARTERY BYPASS GRAFT: SHX141

## 2016-07-11 HISTORY — PX: TEE WITHOUT CARDIOVERSION: SHX5443

## 2016-07-11 HISTORY — DX: Presence of xenogenic heart valve: Z95.3

## 2016-07-11 LAB — POCT I-STAT, CHEM 8
BUN: 22 mg/dL — ABNORMAL HIGH (ref 6–20)
BUN: 21 mg/dL — ABNORMAL HIGH (ref 6–20)
BUN: 18 mg/dL (ref 6–20)
BUN: 18 mg/dL (ref 6–20)
BUN: 18 mg/dL (ref 6–20)
BUN: 17 mg/dL (ref 6–20)
BUN: 19 mg/dL (ref 6–20)
CALCIUM ION: 1.19 mmol/L (ref 1.15–1.40)
CALCIUM ION: 1.22 mmol/L (ref 1.15–1.40)
CALCIUM ION: 1.04 mmol/L — AB (ref 1.15–1.40)
CALCIUM ION: 1.07 mmol/L — AB (ref 1.15–1.40)
CALCIUM ION: 1.04 mmol/L — AB (ref 1.15–1.40)
CALCIUM ION: 1.13 mmol/L — AB (ref 1.15–1.40)
CHLORIDE: 102 mmol/L (ref 101–111)
CHLORIDE: 98 mmol/L — AB (ref 101–111)
CHLORIDE: 101 mmol/L (ref 101–111)
CHLORIDE: 102 mmol/L (ref 101–111)
CREATININE: 1 mg/dL (ref 0.61–1.24)
CREATININE: 0.9 mg/dL (ref 0.61–1.24)
CREATININE: 0.9 mg/dL (ref 0.61–1.24)
Calcium, Ion: 1.09 mmol/L — ABNORMAL LOW (ref 1.15–1.40)
Chloride: 103 mmol/L (ref 101–111)
Chloride: 100 mmol/L — ABNORMAL LOW (ref 101–111)
Chloride: 103 mmol/L (ref 101–111)
Creatinine, Ser: 0.8 mg/dL (ref 0.61–1.24)
Creatinine, Ser: 0.8 mg/dL (ref 0.61–1.24)
Creatinine, Ser: 0.8 mg/dL (ref 0.61–1.24)
Creatinine, Ser: 0.8 mg/dL (ref 0.61–1.24)
GLUCOSE: 140 mg/dL — AB (ref 65–99)
GLUCOSE: 113 mg/dL — AB (ref 65–99)
GLUCOSE: 104 mg/dL — AB (ref 65–99)
GLUCOSE: 131 mg/dL — AB (ref 65–99)
GLUCOSE: 180 mg/dL — AB (ref 65–99)
Glucose, Bld: 134 mg/dL — ABNORMAL HIGH (ref 65–99)
Glucose, Bld: 164 mg/dL — ABNORMAL HIGH (ref 65–99)
HCT: 32 % — ABNORMAL LOW (ref 39.0–52.0)
HCT: 28 % — ABNORMAL LOW (ref 39.0–52.0)
HCT: 22 % — ABNORMAL LOW (ref 39.0–52.0)
HCT: 21 % — ABNORMAL LOW (ref 39.0–52.0)
HCT: 26 % — ABNORMAL LOW (ref 39.0–52.0)
HCT: 30 % — ABNORMAL LOW (ref 39.0–52.0)
HEMATOCRIT: 23 % — AB (ref 39.0–52.0)
HEMOGLOBIN: 10.9 g/dL — AB (ref 13.0–17.0)
HEMOGLOBIN: 7.5 g/dL — AB (ref 13.0–17.0)
Hemoglobin: 9.5 g/dL — ABNORMAL LOW (ref 13.0–17.0)
Hemoglobin: 7.8 g/dL — ABNORMAL LOW (ref 13.0–17.0)
Hemoglobin: 7.1 g/dL — ABNORMAL LOW (ref 13.0–17.0)
Hemoglobin: 10.2 g/dL — ABNORMAL LOW (ref 13.0–17.0)
Hemoglobin: 8.8 g/dL — ABNORMAL LOW (ref 13.0–17.0)
POTASSIUM: 3.8 mmol/L (ref 3.5–5.1)
POTASSIUM: 3.6 mmol/L (ref 3.5–5.1)
POTASSIUM: 4.3 mmol/L (ref 3.5–5.1)
Potassium: 3.7 mmol/L (ref 3.5–5.1)
Potassium: 4.1 mmol/L (ref 3.5–5.1)
Potassium: 4.2 mmol/L (ref 3.5–5.1)
Potassium: 4.1 mmol/L (ref 3.5–5.1)
SODIUM: 141 mmol/L (ref 135–145)
SODIUM: 137 mmol/L (ref 135–145)
Sodium: 140 mmol/L (ref 135–145)
Sodium: 142 mmol/L (ref 135–145)
Sodium: 139 mmol/L (ref 135–145)
Sodium: 141 mmol/L (ref 135–145)
Sodium: 141 mmol/L (ref 135–145)
TCO2: 30 mmol/L (ref 0–100)
TCO2: 29 mmol/L (ref 0–100)
TCO2: 28 mmol/L (ref 0–100)
TCO2: 27 mmol/L (ref 0–100)
TCO2: 28 mmol/L (ref 0–100)
TCO2: 22 mmol/L (ref 0–100)
TCO2: 23 mmol/L (ref 0–100)

## 2016-07-11 LAB — CBC WITH DIFFERENTIAL/PLATELET
BASOS ABS: 0 10*3/uL (ref 0.0–0.1)
Basophils Relative: 0 %
EOS ABS: 0.1 10*3/uL (ref 0.0–0.7)
Eosinophils Relative: 1 %
HCT: 27.5 % — ABNORMAL LOW (ref 39.0–52.0)
Hemoglobin: 9.2 g/dL — ABNORMAL LOW (ref 13.0–17.0)
LYMPHS ABS: 0.6 10*3/uL — AB (ref 0.7–4.0)
Lymphocytes Relative: 5 %
MCH: 28.3 pg (ref 26.0–34.0)
MCHC: 33.5 g/dL (ref 30.0–36.0)
MCV: 84.6 fL (ref 78.0–100.0)
MONO ABS: 0.8 10*3/uL (ref 0.1–1.0)
Monocytes Relative: 6 %
NEUTROS PCT: 88 %
Neutro Abs: 11 10*3/uL — ABNORMAL HIGH (ref 1.7–7.7)
Platelets: 94 10*3/uL — ABNORMAL LOW (ref 150–400)
RBC: 3.25 MIL/uL — AB (ref 4.22–5.81)
RDW: 13.6 % (ref 11.5–15.5)
WBC: 12.5 10*3/uL — AB (ref 4.0–10.5)

## 2016-07-11 LAB — POCT I-STAT 3, ART BLOOD GAS (G3+)
ACID-BASE DEFICIT: 4 mmol/L — AB (ref 0.0–2.0)
Acid-Base Excess: 4 mmol/L — ABNORMAL HIGH (ref 0.0–2.0)
Acid-base deficit: 6 mmol/L — ABNORMAL HIGH (ref 0.0–2.0)
BICARBONATE: 21.4 mmol/L (ref 20.0–28.0)
Bicarbonate: 28.9 mmol/L — ABNORMAL HIGH (ref 20.0–28.0)
Bicarbonate: 22.3 mmol/L (ref 20.0–28.0)
O2 Saturation: 100 %
O2 Saturation: 93 %
O2 Saturation: 99 %
PH ART: 7.442 (ref 7.350–7.450)
PH ART: 7.248 — AB (ref 7.350–7.450)
PH ART: 7.355 (ref 7.350–7.450)
TCO2: 30 mmol/L (ref 0–100)
TCO2: 23 mmol/L (ref 0–100)
TCO2: 24 mmol/L (ref 0–100)
pCO2 arterial: 42.3 mmHg (ref 32.0–48.0)
pCO2 arterial: 49.1 mmHg — ABNORMAL HIGH (ref 32.0–48.0)
pCO2 arterial: 39.1 mmHg (ref 32.0–48.0)
pO2, Arterial: 412 mmHg — ABNORMAL HIGH (ref 83.0–108.0)
pO2, Arterial: 80 mmHg — ABNORMAL LOW (ref 83.0–108.0)
pO2, Arterial: 114 mmHg — ABNORMAL HIGH (ref 83.0–108.0)

## 2016-07-11 LAB — GLUCOSE, CAPILLARY
GLUCOSE-CAPILLARY: 145 mg/dL — AB (ref 65–99)
GLUCOSE-CAPILLARY: 132 mg/dL — AB (ref 65–99)
Glucose-Capillary: 124 mg/dL — ABNORMAL HIGH (ref 65–99)
Glucose-Capillary: 109 mg/dL — ABNORMAL HIGH (ref 65–99)

## 2016-07-11 LAB — PROTIME-INR
INR: 2.1
INR: 1.79
INR: 1.72
PROTHROMBIN TIME: 23.9 s — AB (ref 11.4–15.2)
PROTHROMBIN TIME: 20.4 s — AB (ref 11.4–15.2)
Prothrombin Time: 21 seconds — ABNORMAL HIGH (ref 11.4–15.2)

## 2016-07-11 LAB — CBC
HEMATOCRIT: 22 % — AB (ref 39.0–52.0)
HEMATOCRIT: 35.8 % — AB (ref 39.0–52.0)
HEMOGLOBIN: 7.4 g/dL — AB (ref 13.0–17.0)
Hemoglobin: 12.2 g/dL — ABNORMAL LOW (ref 13.0–17.0)
MCH: 28.4 pg (ref 26.0–34.0)
MCH: 28.6 pg (ref 26.0–34.0)
MCHC: 33.6 g/dL (ref 30.0–36.0)
MCHC: 34.1 g/dL (ref 30.0–36.0)
MCV: 84.3 fL (ref 78.0–100.0)
MCV: 83.8 fL (ref 78.0–100.0)
PLATELETS: 87 10*3/uL — AB (ref 150–400)
Platelets: 113 10*3/uL — ABNORMAL LOW (ref 150–400)
RBC: 2.61 MIL/uL — AB (ref 4.22–5.81)
RBC: 4.27 MIL/uL (ref 4.22–5.81)
RDW: 13.6 % (ref 11.5–15.5)
RDW: 13.8 % (ref 11.5–15.5)
WBC: 10.1 10*3/uL (ref 4.0–10.5)
WBC: 9.9 10*3/uL (ref 4.0–10.5)

## 2016-07-11 LAB — PREPARE RBC (CROSSMATCH)

## 2016-07-11 LAB — HEMOGLOBIN AND HEMATOCRIT, BLOOD
HCT: 20 % — ABNORMAL LOW (ref 39.0–52.0)
Hemoglobin: 6.6 g/dL — CL (ref 13.0–17.0)

## 2016-07-11 LAB — POCT I-STAT 4, (NA,K, GLUC, HGB,HCT)
GLUCOSE: 147 mg/dL — AB (ref 65–99)
HCT: 35 % — ABNORMAL LOW (ref 39.0–52.0)
HEMOGLOBIN: 11.9 g/dL — AB (ref 13.0–17.0)
POTASSIUM: 4 mmol/L (ref 3.5–5.1)
Sodium: 144 mmol/L (ref 135–145)

## 2016-07-11 LAB — APTT
APTT: 63 s — AB (ref 24–36)
APTT: 47 s — AB (ref 24–36)
aPTT: 44 seconds — ABNORMAL HIGH (ref 24–36)

## 2016-07-11 LAB — FIBRINOGEN
FIBRINOGEN: 197 mg/dL — AB (ref 210–475)
FIBRINOGEN: 149 mg/dL — AB (ref 210–475)

## 2016-07-11 LAB — PLATELET COUNT: PLATELETS: 97 10*3/uL — AB (ref 150–400)

## 2016-07-11 SURGERY — REDO CORONARY ARTERY BYPASS GRAFTING (CABG)
Anesthesia: General | Site: Chest

## 2016-07-11 MED ORDER — LACTATED RINGERS IV SOLN: INTRAVENOUS | Status: DC

## 2016-07-11 MED ORDER — ONDANSETRON HCL 4 MG/2ML IJ SOLN: 4.0000 mg | Freq: Four times a day (QID) | INTRAMUSCULAR | Status: DC | PRN

## 2016-07-11 MED ORDER — FENTANYL CITRATE (PF) 250 MCG/5ML IJ SOLN
INTRAMUSCULAR | Status: AC
  Filled 2016-07-11: qty 5

## 2016-07-11 MED ORDER — SODIUM CHLORIDE 0.9 % IV SOLN
INTRAVENOUS | Status: DC
  Administered 2016-07-11: 20:00:00 via INTRAVENOUS

## 2016-07-11 MED ORDER — ALBUMIN HUMAN 5 % IV SOLN: 250.0000 mL | INTRAVENOUS | Status: DC | PRN

## 2016-07-11 MED ORDER — LACTATED RINGERS IV SOLN
INTRAVENOUS | Status: DC | PRN
  Administered 2016-07-11 (×2): via INTRAVENOUS

## 2016-07-11 MED ORDER — MILRINONE LACTATE IN DEXTROSE 20-5 MG/100ML-% IV SOLN
0.1250 ug/kg/min | INTRAVENOUS | Status: AC
  Administered 2016-07-11: 0.375 ug/kg/min via INTRAVENOUS
  Filled 2016-07-11: qty 100

## 2016-07-11 MED ORDER — DEXMEDETOMIDINE HCL IN NACL 200 MCG/50ML IV SOLN
0.0000 ug/kg/h | INTRAVENOUS | Status: DC
  Administered 2016-07-11: 0.7 ug/kg/h via INTRAVENOUS
  Filled 2016-07-11: qty 50

## 2016-07-11 MED ORDER — ROCURONIUM BROMIDE 10 MG/ML (PF) SYRINGE
PREFILLED_SYRINGE | INTRAVENOUS | Status: DC | PRN
  Administered 2016-07-11 (×4): 50 mg via INTRAVENOUS
  Administered 2016-07-11: 40 mg via INTRAVENOUS
  Administered 2016-07-11: 50 mg via INTRAVENOUS

## 2016-07-11 MED ORDER — VANCOMYCIN HCL 1000 MG IV SOLR
INTRAVENOUS | Status: AC
  Filled 2016-07-11: qty 1000

## 2016-07-11 MED ORDER — MILRINONE LACTATE IN DEXTROSE 20-5 MG/100ML-% IV SOLN
0.1250 ug/kg/min | INTRAVENOUS | Status: DC
  Administered 2016-07-11: 0.375 ug/kg/min via INTRAVENOUS
  Administered 2016-07-12: 0.3 ug/kg/min via INTRAVENOUS
  Administered 2016-07-12: 0.375 ug/kg/min via INTRAVENOUS
  Administered 2016-07-13: 0.3 ug/kg/min via INTRAVENOUS
  Administered 2016-07-13: 0.2 ug/kg/min via INTRAVENOUS
  Administered 2016-07-14 – 2016-07-16 (×3): 0.125 ug/kg/min via INTRAVENOUS
  Administered 2016-07-17 – 2016-07-18 (×2): 0.25 ug/kg/min via INTRAVENOUS
  Filled 2016-07-11 (×10): qty 100

## 2016-07-11 MED ORDER — ALBUMIN HUMAN 5 % IV SOLN
INTRAVENOUS | Status: DC | PRN
  Administered 2016-07-11 (×5): via INTRAVENOUS

## 2016-07-11 MED ORDER — ACETAMINOPHEN 160 MG/5ML PO SOLN
1000.0000 mg | Freq: Four times a day (QID) | ORAL | Status: DC
Start: 2016-07-12 — End: 2016-07-11

## 2016-07-11 MED ORDER — ACETAMINOPHEN 500 MG PO TABS: 1000.0000 mg | ORAL_TABLET | Freq: Four times a day (QID) | ORAL | Status: DC

## 2016-07-11 MED ORDER — FAMOTIDINE IN NACL 20-0.9 MG/50ML-% IV SOLN
20.0000 mg | Freq: Two times a day (BID) | INTRAVENOUS | Status: AC
  Administered 2016-07-12: 20 mg via INTRAVENOUS
  Filled 2016-07-11: qty 50

## 2016-07-11 MED ORDER — EPINEPHRINE PF 1 MG/ML IJ SOLN
0.5000 ug/min | INTRAVENOUS | Status: DC
  Filled 2016-07-11 (×2): qty 4

## 2016-07-11 MED ORDER — NOREPINEPHRINE BITARTRATE 1 MG/ML IV SOLN
0.0000 ug/min | INTRAVENOUS | Status: DC
  Administered 2016-07-11: 8 ug/min via INTRAVENOUS
  Filled 2016-07-11: qty 4

## 2016-07-11 MED ORDER — METOPROLOL TARTRATE 5 MG/5ML IV SOLN
2.5000 mg | INTRAVENOUS | Status: DC | PRN
  Administered 2016-07-15: 5 mg via INTRAVENOUS
  Filled 2016-07-11 (×2): qty 5

## 2016-07-11 MED ORDER — FAMOTIDINE IN NACL 20-0.9 MG/50ML-% IV SOLN
20.0000 mg | Freq: Two times a day (BID) | INTRAVENOUS | Status: DC
  Administered 2016-07-11: 20 mg via INTRAVENOUS

## 2016-07-11 MED ORDER — HEPARIN SODIUM (PORCINE) 1000 UNIT/ML IJ SOLN
INTRAMUSCULAR | Status: DC | PRN
  Administered 2016-07-11: 35000 [IU] via INTRAVENOUS

## 2016-07-11 MED ORDER — DEXMEDETOMIDINE HCL IN NACL 400 MCG/100ML IV SOLN
0.0000 ug/kg/h | INTRAVENOUS | Status: DC
  Administered 2016-07-11 – 2016-07-12 (×2): 0.7 ug/kg/h via INTRAVENOUS
  Filled 2016-07-11 (×3): qty 50
  Filled 2016-07-11: qty 100

## 2016-07-11 MED ORDER — DOCUSATE SODIUM 100 MG PO CAPS: 200.0000 mg | ORAL_CAPSULE | Freq: Every day | ORAL | Status: DC

## 2016-07-11 MED ORDER — MORPHINE SULFATE (PF) 2 MG/ML IV SOLN: 1.0000 mg | INTRAVENOUS | Status: DC | PRN

## 2016-07-11 MED ORDER — MIDAZOLAM HCL 2 MG/2ML IJ SOLN: 2.0000 mg | INTRAMUSCULAR | Status: DC | PRN

## 2016-07-11 MED ORDER — CALCIUM CHLORIDE 10 % IV SOLN
INTRAVENOUS | Status: DC | PRN
  Administered 2016-07-11: 1 g via INTRAVENOUS

## 2016-07-11 MED ORDER — BISACODYL 5 MG PO TBEC: 10.0000 mg | DELAYED_RELEASE_TABLET | Freq: Every day | ORAL | Status: DC

## 2016-07-11 MED ORDER — CHLORHEXIDINE GLUCONATE 4 % EX LIQD: 30.0000 mL | CUTANEOUS | Status: DC

## 2016-07-11 MED ORDER — SODIUM CHLORIDE 0.9 % IV SOLN: Freq: Once | INTRAVENOUS | Status: DC

## 2016-07-11 MED ORDER — SODIUM CHLORIDE 0.9% FLUSH: 3.0000 mL | INTRAVENOUS | Status: DC | PRN

## 2016-07-11 MED ORDER — LACTATED RINGERS IV SOLN
INTRAVENOUS | Status: DC | PRN
  Administered 2016-07-11 (×2): via INTRAVENOUS

## 2016-07-11 MED ORDER — SODIUM CHLORIDE 0.9 % IV SOLN
INTRAVENOUS | Status: DC
  Administered 2016-07-11: 22:00:00 via INTRAVENOUS
  Filled 2016-07-11: qty 2.5

## 2016-07-11 MED ORDER — VANCOMYCIN HCL IN DEXTROSE 1-5 GM/200ML-% IV SOLN
1000.0000 mg | Freq: Once | INTRAVENOUS | Status: DC
  Filled 2016-07-11: qty 200

## 2016-07-11 MED ORDER — MIDAZOLAM HCL 5 MG/5ML IJ SOLN
INTRAMUSCULAR | Status: DC | PRN
  Administered 2016-07-11 (×2): 1 mg via INTRAVENOUS
  Administered 2016-07-11 (×2): 2 mg via INTRAVENOUS
  Administered 2016-07-11: 4 mg via INTRAVENOUS

## 2016-07-11 MED ORDER — MAGNESIUM SULFATE 4 GM/100ML IV SOLN: 4.0000 g | Freq: Once | INTRAVENOUS | Status: DC

## 2016-07-11 MED ORDER — DEXTROSE 5 % IV SOLN
0.0000 ug/min | INTRAVENOUS | Status: DC
  Filled 2016-07-11: qty 4

## 2016-07-11 MED ORDER — VANCOMYCIN HCL 1000 MG IV SOLR
INTRAVENOUS | Status: DC | PRN
  Administered 2016-07-11: 1000 mL

## 2016-07-11 MED ORDER — SODIUM CHLORIDE 0.9 % IV SOLN
Freq: Once | INTRAVENOUS | Status: AC
  Administered 2016-07-16: 22:00:00 via INTRAVENOUS

## 2016-07-11 MED ORDER — OXYCODONE HCL 5 MG PO TABS
5.0000 mg | ORAL_TABLET | ORAL | Status: DC | PRN
  Administered 2016-07-13: 10 mg via ORAL
  Filled 2016-07-11: qty 2

## 2016-07-11 MED ORDER — NOREPINEPHRINE BITARTRATE 1 MG/ML IV SOLN
0.0000 ug/min | INTRAVENOUS | Status: AC
  Administered 2016-07-11: 5 ug/min via INTRAVENOUS
  Filled 2016-07-11: qty 4

## 2016-07-11 MED ORDER — DEXTROSE 5 % IV SOLN
0.0000 ug/min | INTRAVENOUS | Status: DC
  Filled 2016-07-11: qty 2

## 2016-07-11 MED ORDER — SODIUM CHLORIDE 0.9 % IV SOLN
250.0000 mL | INTRAVENOUS | Status: DC
  Administered 2016-07-19: 500 mL via INTRAVENOUS

## 2016-07-11 MED ORDER — PROPOFOL 10 MG/ML IV BOLUS
INTRAVENOUS | Status: AC
  Filled 2016-07-11: qty 20

## 2016-07-11 MED ORDER — TRAMADOL HCL 50 MG PO TABS: 50.0000 mg | ORAL_TABLET | ORAL | Status: DC | PRN

## 2016-07-11 MED ORDER — FENTANYL CITRATE (PF) 250 MCG/5ML IJ SOLN
INTRAMUSCULAR | Status: DC | PRN
  Administered 2016-07-11: 150 ug via INTRAVENOUS
  Administered 2016-07-11 (×3): 100 ug via INTRAVENOUS
  Administered 2016-07-11: 50 ug via INTRAVENOUS
  Administered 2016-07-11 (×3): 150 ug via INTRAVENOUS
  Administered 2016-07-11: 200 ug via INTRAVENOUS
  Administered 2016-07-11: 100 ug via INTRAVENOUS

## 2016-07-11 MED ORDER — FENTANYL CITRATE (PF) 250 MCG/5ML IJ SOLN
INTRAMUSCULAR | Status: DC | PRN
  Administered 2016-07-11: 100 ug via INTRAVENOUS

## 2016-07-11 MED ORDER — DEXTROSE 5 % IV SOLN
1.5000 g | Freq: Two times a day (BID) | INTRAVENOUS | Status: AC
  Administered 2016-07-11 – 2016-07-13 (×4): 1.5 g via INTRAVENOUS
  Filled 2016-07-11 (×4): qty 1.5

## 2016-07-11 MED ORDER — LACTATED RINGERS IV SOLN: 500.0000 mL | Freq: Once | INTRAVENOUS | Status: DC | PRN

## 2016-07-11 MED ORDER — CHLORHEXIDINE GLUCONATE 0.12 % MT SOLN
15.0000 mL | OROMUCOSAL | Status: AC
  Administered 2016-07-11: 15 mL via OROMUCOSAL

## 2016-07-11 MED ORDER — SODIUM CHLORIDE 0.45 % IV SOLN
INTRAVENOUS | Status: DC | PRN
  Administered 2016-07-11: 17:00:00 via INTRAVENOUS

## 2016-07-11 MED ORDER — SODIUM CHLORIDE 0.9 % IV SOLN: INTRAVENOUS | Status: DC

## 2016-07-11 MED ORDER — ASPIRIN 81 MG PO CHEW: 324.0000 mg | CHEWABLE_TABLET | Freq: Every day | ORAL | Status: DC

## 2016-07-11 MED ORDER — HEMOSTATIC AGENTS (NO CHARGE) OPTIME
TOPICAL | Status: DC | PRN
  Administered 2016-07-11: 2 via TOPICAL
  Administered 2016-07-11: 1 via TOPICAL

## 2016-07-11 MED ORDER — SODIUM CHLORIDE 0.9 % IV SOLN: 250.0000 mL | INTRAVENOUS | Status: DC

## 2016-07-11 MED ORDER — PANTOPRAZOLE SODIUM 40 MG PO TBEC: 40.0000 mg | DELAYED_RELEASE_TABLET | Freq: Every day | ORAL | Status: DC

## 2016-07-11 MED ORDER — GELATIN ABSORBABLE MT POWD
OROMUCOSAL | Status: DC | PRN
  Administered 2016-07-11 (×2): 4 mL via TOPICAL

## 2016-07-11 MED ORDER — PAPAVERINE HCL 30 MG/ML IJ SOLN
INTRAMUSCULAR | Status: AC
  Administered 2016-07-11: 500 mL
  Filled 2016-07-11: qty 2.5

## 2016-07-11 MED ORDER — PROTAMINE SULFATE 10 MG/ML IV SOLN
INTRAVENOUS | Status: DC | PRN
  Administered 2016-07-11: 10 mg via INTRAVENOUS
  Administered 2016-07-11: 290 mg via INTRAVENOUS

## 2016-07-11 MED ORDER — BISACODYL 10 MG RE SUPP: 10.0000 mg | Freq: Every day | RECTAL | Status: DC

## 2016-07-11 MED ORDER — MORPHINE SULFATE (PF) 2 MG/ML IV SOLN
1.0000 mg | INTRAVENOUS | Status: DC | PRN
  Administered 2016-07-12 (×5): 2 mg via INTRAVENOUS
  Filled 2016-07-11 (×5): qty 1

## 2016-07-11 MED ORDER — SODIUM CHLORIDE 0.9 % IV SOLN
INTRAVENOUS | Status: DC
  Filled 2016-07-11: qty 2.5

## 2016-07-11 MED ORDER — DEXTROSE 5 % IV SOLN
1.5000 g | Freq: Two times a day (BID) | INTRAVENOUS | Status: DC
  Filled 2016-07-11: qty 1.5

## 2016-07-11 MED ORDER — METOPROLOL TARTRATE 25 MG/10 ML ORAL SUSPENSION: 12.5000 mg | Freq: Two times a day (BID) | ORAL | Status: DC

## 2016-07-11 MED ORDER — EPINEPHRINE PF 1 MG/ML IJ SOLN: 0.5000 ug/min | INTRAMUSCULAR | Status: DC

## 2016-07-11 MED ORDER — POTASSIUM CHLORIDE 10 MEQ/50ML IV SOLN: 10.0000 meq | INTRAVENOUS | Status: DC

## 2016-07-11 MED ORDER — PHENYLEPHRINE HCL 10 MG/ML IJ SOLN
0.0000 ug/min | INTRAVENOUS | Status: DC
  Filled 2016-07-11: qty 2

## 2016-07-11 MED ORDER — SODIUM CHLORIDE 0.9 % IV SOLN
INTRAVENOUS | Status: DC
  Administered 2016-07-11: 19:00:00 via INTRAVENOUS

## 2016-07-11 MED ORDER — METOPROLOL TARTRATE 12.5 MG HALF TABLET: 12.5000 mg | ORAL_TABLET | Freq: Two times a day (BID) | ORAL | Status: DC

## 2016-07-11 MED ORDER — TRANEXAMIC ACID 1000 MG/10ML IV SOLN
1.5000 mg/kg/h | INTRAVENOUS | Status: AC
  Administered 2016-07-11: 1.5 mg/kg/h via INTRAVENOUS
  Filled 2016-07-11: qty 25

## 2016-07-11 MED ORDER — METOPROLOL TARTRATE 5 MG/5ML IV SOLN: 2.5000 mg | INTRAVENOUS | Status: DC | PRN

## 2016-07-11 MED ORDER — ACETAMINOPHEN 650 MG RE SUPP: 650.0000 mg | Freq: Once | RECTAL | Status: DC

## 2016-07-11 MED ORDER — ORAL CARE MOUTH RINSE
15.0000 mL | Freq: Four times a day (QID) | OROMUCOSAL | Status: DC
  Administered 2016-07-12 (×2): 15 mL via OROMUCOSAL

## 2016-07-11 MED ORDER — SODIUM CHLORIDE 0.9 % IR SOLN
Status: DC | PRN
  Administered 2016-07-11: 6000 mL

## 2016-07-11 MED ORDER — ROCURONIUM BROMIDE 10 MG/ML (PF) SYRINGE
PREFILLED_SYRINGE | INTRAVENOUS | Status: AC
  Filled 2016-07-11: qty 10

## 2016-07-11 MED ORDER — CHLORHEXIDINE GLUCONATE 0.12% ORAL RINSE (MEDLINE KIT)
15.0000 mL | Freq: Two times a day (BID) | OROMUCOSAL | Status: DC
  Administered 2016-07-12: 15 mL via OROMUCOSAL

## 2016-07-11 MED ORDER — MIDAZOLAM HCL 10 MG/2ML IJ SOLN
INTRAMUSCULAR | Status: AC
  Filled 2016-07-11: qty 2

## 2016-07-11 MED ORDER — ASPIRIN EC 325 MG PO TBEC: 325.0000 mg | DELAYED_RELEASE_TABLET | Freq: Every day | ORAL | Status: DC

## 2016-07-11 MED ORDER — ONDANSETRON HCL 4 MG/2ML IJ SOLN
4.0000 mg | Freq: Four times a day (QID) | INTRAMUSCULAR | Status: DC | PRN
  Administered 2016-07-12 – 2016-07-23 (×6): 4 mg via INTRAVENOUS
  Filled 2016-07-11 (×7): qty 2

## 2016-07-11 MED ORDER — MILRINONE LACTATE IN DEXTROSE 20-5 MG/100ML-% IV SOLN
0.1250 ug/kg/min | INTRAVENOUS | Status: DC
  Filled 2016-07-11: qty 100

## 2016-07-11 MED ORDER — 0.9 % SODIUM CHLORIDE (POUR BTL) OPTIME
TOPICAL | Status: DC | PRN
  Administered 2016-07-11: 3000 mL

## 2016-07-11 MED ORDER — DOPAMINE-DEXTROSE 3.2-5 MG/ML-% IV SOLN
0.0000 ug/kg/min | INTRAVENOUS | Status: DC
  Administered 2016-07-12: 5 ug/kg/min via INTRAVENOUS
  Filled 2016-07-11: qty 250

## 2016-07-11 MED ORDER — EPINEPHRINE PF 1 MG/ML IJ SOLN
0.0000 ug/min | INTRAVENOUS | Status: DC
  Filled 2016-07-11: qty 4

## 2016-07-11 MED ORDER — SODIUM CHLORIDE 0.45 % IV SOLN
INTRAVENOUS | Status: DC | PRN
  Administered 2016-07-11: 20:00:00 via INTRAVENOUS

## 2016-07-11 MED ORDER — INSULIN REGULAR BOLUS VIA INFUSION
0.0000 [IU] | Freq: Three times a day (TID) | INTRAVENOUS | Status: DC
  Filled 2016-07-11: qty 10

## 2016-07-11 MED ORDER — VASOPRESSIN 20 UNIT/ML IV SOLN
0.0300 [IU]/min | INTRAVENOUS | Status: DC
  Filled 2016-07-11: qty 2

## 2016-07-11 MED ORDER — ALBUMIN HUMAN 5 % IV SOLN
250.0000 mL | INTRAVENOUS | Status: AC | PRN
  Administered 2016-07-12: 250 mL via INTRAVENOUS
  Filled 2016-07-11: qty 250

## 2016-07-11 MED ORDER — ACETAMINOPHEN 650 MG RE SUPP
650.0000 mg | Freq: Once | RECTAL | Status: AC
  Administered 2016-07-11: 650 mg via RECTAL

## 2016-07-11 MED ORDER — NOREPINEPHRINE BITARTRATE 1 MG/ML IV SOLN
0.0000 ug/min | INTRAVENOUS | Status: DC
  Administered 2016-07-11: 13 ug/min via INTRAVENOUS
  Administered 2016-07-12: 1 ug/min via INTRAVENOUS
  Administered 2016-07-12: 8 ug/min via INTRAVENOUS
  Filled 2016-07-11 (×4): qty 4

## 2016-07-11 MED ORDER — VANCOMYCIN HCL IN DEXTROSE 1-5 GM/200ML-% IV SOLN
1000.0000 mg | Freq: Once | INTRAVENOUS | Status: AC
  Administered 2016-07-12: 1000 mg via INTRAVENOUS
  Filled 2016-07-11: qty 200

## 2016-07-11 MED ORDER — PROPOFOL 10 MG/ML IV BOLUS
INTRAVENOUS | Status: DC | PRN
  Administered 2016-07-11: 80 mg via INTRAVENOUS

## 2016-07-11 MED ORDER — ACETAMINOPHEN 160 MG/5ML PO SOLN: 650.0000 mg | Freq: Once | ORAL | Status: AC

## 2016-07-11 MED ORDER — CHLORHEXIDINE GLUCONATE 0.12 % MT SOLN: 15.0000 mL | OROMUCOSAL | Status: DC

## 2016-07-11 MED ORDER — HEMOSTATIC AGENTS (NO CHARGE) OPTIME
TOPICAL | Status: DC | PRN
  Administered 2016-07-11 (×2): 1 via TOPICAL

## 2016-07-11 MED ORDER — PHENYLEPHRINE HCL 10 MG/ML IJ SOLN
INTRAMUSCULAR | Status: DC | PRN
  Administered 2016-07-11: 15 ug/min via INTRAVENOUS

## 2016-07-11 MED ORDER — NITROGLYCERIN IN D5W 200-5 MCG/ML-% IV SOLN: 0.0000 ug/min | INTRAVENOUS | Status: DC

## 2016-07-11 MED ORDER — OXYCODONE HCL 5 MG PO TABS: 5.0000 mg | ORAL_TABLET | ORAL | Status: DC | PRN

## 2016-07-11 MED ORDER — BISACODYL 5 MG PO TBEC
10.0000 mg | DELAYED_RELEASE_TABLET | Freq: Every day | ORAL | Status: DC
Start: 2016-07-12 — End: 2016-08-01
  Administered 2016-07-18 – 2016-08-01 (×9): 10 mg via ORAL
  Filled 2016-07-11 (×13): qty 2

## 2016-07-11 MED ORDER — HEMOSTATIC AGENTS (NO CHARGE) OPTIME
TOPICAL | Status: DC | PRN
  Administered 2016-07-11: 1 via TOPICAL

## 2016-07-11 MED ORDER — MILRINONE LACTATE IN DEXTROSE 20-5 MG/100ML-% IV SOLN
0.3750 ug/kg/min | INTRAVENOUS | Status: DC
  Administered 2016-07-11: .375 ug/kg/min via INTRAVENOUS

## 2016-07-11 MED ORDER — MAGNESIUM SULFATE 4 GM/100ML IV SOLN
4.0000 g | Freq: Once | INTRAVENOUS | Status: AC
  Administered 2016-07-11: 4 g via INTRAVENOUS
  Filled 2016-07-11: qty 100

## 2016-07-11 MED ORDER — SODIUM BICARBONATE 8.4 % IV SOLN
INTRAVENOUS | Status: DC | PRN
  Administered 2016-07-11: 50 meq via INTRAVENOUS

## 2016-07-11 MED ORDER — NITROGLYCERIN IN D5W 200-5 MCG/ML-% IV SOLN
0.0000 ug/min | INTRAVENOUS | Status: DC
  Administered 2016-07-11: 5 ug/min via INTRAVENOUS

## 2016-07-11 MED ORDER — SODIUM CHLORIDE 0.9% FLUSH
3.0000 mL | Freq: Two times a day (BID) | INTRAVENOUS | Status: DC
  Administered 2016-07-12 – 2016-07-17 (×6): 3 mL via INTRAVENOUS

## 2016-07-11 MED ORDER — SODIUM CHLORIDE 0.9% FLUSH: 3.0000 mL | Freq: Two times a day (BID) | INTRAVENOUS | Status: DC

## 2016-07-11 MED ORDER — ACETAMINOPHEN 160 MG/5ML PO SOLN
1000.0000 mg | Freq: Four times a day (QID) | ORAL | Status: DC
  Administered 2016-07-12 (×2): 1000 mg
  Filled 2016-07-11 (×2): qty 40.6

## 2016-07-11 MED ORDER — ROCURONIUM BROMIDE 100 MG/10ML IV SOLN
INTRAVENOUS | Status: DC | PRN
  Administered 2016-07-11: 50 mg via INTRAVENOUS

## 2016-07-11 MED ORDER — POTASSIUM CHLORIDE 10 MEQ/50ML IV SOLN: 10.0000 meq | INTRAVENOUS | Status: AC

## 2016-07-11 MED ORDER — INSULIN REGULAR BOLUS VIA INFUSION: 0.0000 [IU] | Freq: Three times a day (TID) | INTRAVENOUS | Status: DC

## 2016-07-11 MED ORDER — SODIUM CHLORIDE 0.9 % IJ SOLN
OROMUCOSAL | Status: DC | PRN
  Administered 2016-07-11 (×4): 4 mL via TOPICAL

## 2016-07-11 MED ORDER — FENTANYL CITRATE (PF) 250 MCG/5ML IJ SOLN
INTRAMUSCULAR | Status: AC
  Filled 2016-07-11: qty 25

## 2016-07-11 MED ORDER — DOPAMINE-DEXTROSE 3.2-5 MG/ML-% IV SOLN: 0.0000 ug/kg/min | INTRAVENOUS | Status: DC

## 2016-07-11 MED ORDER — GLYCOPYRROLATE 0.2 MG/ML IJ SOLN
INTRAMUSCULAR | Status: DC | PRN
  Administered 2016-07-11: 0.1 mg via INTRAVENOUS

## 2016-07-11 MED ORDER — DOCUSATE SODIUM 100 MG PO CAPS
200.0000 mg | ORAL_CAPSULE | Freq: Every day | ORAL | Status: DC
Start: 2016-07-12 — End: 2016-07-11

## 2016-07-11 MED ORDER — ACETAMINOPHEN 160 MG/5ML PO SOLN: 650.0000 mg | Freq: Once | ORAL | Status: DC

## 2016-07-11 SURGICAL SUPPLY — 97 items
ADAPTER CARDIO PERF ANTE/RETRO (ADAPTER) ×1 IMPLANT
ADPR PRFSN 84XANTGRD RTRGD (ADAPTER)
APPLIER CLIP 9.375 MED OPEN (MISCELLANEOUS)
APPLIER CLIP 9.375 SM OPEN (CLIP)
APR CLP MED 9.3 20 MLT OPN (MISCELLANEOUS)
APR CLP SM 9.3 20 MLT OPN (CLIP)
BAG DECANTER FOR FLEXI CONT (MISCELLANEOUS) ×2 IMPLANT
BANDAGE ACE 4X5 VEL STRL LF (GAUZE/BANDAGES/DRESSINGS) IMPLANT
BANDAGE ACE 6X5 VEL STRL LF (GAUZE/BANDAGES/DRESSINGS) IMPLANT
BLADE CORE FAN STRYKER (BLADE) ×2 IMPLANT
BLADE SURG ROTATE 9660 (MISCELLANEOUS) IMPLANT
BNDG GAUZE ELAST 4 BULKY (GAUZE/BANDAGES/DRESSINGS) ×1 IMPLANT
CANISTER SUCTION 2500CC (MISCELLANEOUS) ×2 IMPLANT
CANNULA EZ GLIDE AORTIC 21FR (CANNULA) IMPLANT
CANNULA GUNDRY RCSP 15FR (MISCELLANEOUS) ×1 IMPLANT
CATH CPB KIT OWEN (MISCELLANEOUS) ×1 IMPLANT
CATH THORACIC 36FR (CATHETERS) IMPLANT
CATH THORACIC 36FR RT ANG (CATHETERS) ×1 IMPLANT
CLIP APPLIE 9.375 MED OPEN (MISCELLANEOUS) IMPLANT
CLIP APPLIE 9.375 SM OPEN (CLIP) IMPLANT
CLIP FOGARTY SPRING 6M (CLIP) IMPLANT
CLIP TI MEDIUM 24 (CLIP) IMPLANT
CLIP TI WIDE RED SMALL 24 (CLIP) IMPLANT
CONN Y 3/8X3/8X3/8  BEN (MISCELLANEOUS)
CONN Y 3/8X3/8X3/8 BEN (MISCELLANEOUS) IMPLANT
CRADLE DONUT ADULT HEAD (MISCELLANEOUS) ×2 IMPLANT
DRAIN CHANNEL 32F RND 10.7 FF (WOUND CARE) IMPLANT
DRAPE CARDIOVASCULAR INCISE (DRAPES) ×2
DRAPE INCISE IOBAN 66X45 STRL (DRAPES) ×1 IMPLANT
DRAPE SLUSH/WARMER DISC (DRAPES) ×2 IMPLANT
DRAPE SRG 135X102X78XABS (DRAPES) ×1 IMPLANT
DRSG AQUACEL AG ADV 3.5X14 (GAUZE/BANDAGES/DRESSINGS) ×1 IMPLANT
DRSG COVADERM 4X14 (GAUZE/BANDAGES/DRESSINGS) IMPLANT
ELECT BLADE 4.0 EZ CLEAN MEGAD (MISCELLANEOUS)
ELECT REM PT RETURN 9FT ADLT (ELECTROSURGICAL) ×4
ELECTRODE BLDE 4.0 EZ CLN MEGD (MISCELLANEOUS) IMPLANT
ELECTRODE REM PT RTRN 9FT ADLT (ELECTROSURGICAL) ×2 IMPLANT
FELT TEFLON 1X6 (MISCELLANEOUS) ×4 IMPLANT
GAUZE SPONGE 4X4 12PLY STRL (GAUZE/BANDAGES/DRESSINGS) ×2 IMPLANT
GLOVE ORTHO TXT STRL SZ7.5 (GLOVE) ×5 IMPLANT
GOWN STRL REUS W/ TWL LRG LVL3 (GOWN DISPOSABLE) ×4 IMPLANT
GOWN STRL REUS W/TWL LRG LVL3 (GOWN DISPOSABLE) ×8
HEMOSTAT POWDER SURGIFOAM 1G (HEMOSTASIS) ×4 IMPLANT
HEMOSTAT SURGICEL 2X14 (HEMOSTASIS) IMPLANT
HEMOSTAT SURGICEL 2X4 FIBR (HEMOSTASIS) ×1 IMPLANT
INSERT FOGARTY 61MM (MISCELLANEOUS) IMPLANT
INSERT FOGARTY XLG (MISCELLANEOUS) ×2 IMPLANT
KIT BASIN OR (CUSTOM PROCEDURE TRAY) ×2 IMPLANT
KIT ROOM TURNOVER OR (KITS) ×2 IMPLANT
KIT SUCTION CATH 14FR (SUCTIONS) ×6 IMPLANT
KIT VASOVIEW HEMOPRO VH 3000 (KITS) ×1 IMPLANT
LEAD PACING MYOCARDI (MISCELLANEOUS) ×2 IMPLANT
MARKER GRAFT CORONARY BYPASS (MISCELLANEOUS) ×3 IMPLANT
NS IRRIG 1000ML POUR BTL (IV SOLUTION) ×6 IMPLANT
PACK OPEN HEART (CUSTOM PROCEDURE TRAY) ×2 IMPLANT
PAD ARMBOARD 7.5X6 YLW CONV (MISCELLANEOUS) ×4 IMPLANT
PAD DEFIB R2 (MISCELLANEOUS) ×1 IMPLANT
PAD ELECT DEFIB RADIOL ZOLL (MISCELLANEOUS) ×1 IMPLANT
PENCIL BUTTON HOLSTER BLD 10FT (ELECTRODE) ×1 IMPLANT
PUNCH AORTIC ROTATE 4.0MM (MISCELLANEOUS) IMPLANT
PUNCH AORTIC ROTATE 4.5MM 8IN (MISCELLANEOUS) IMPLANT
PUNCH AORTIC ROTATE 5MM 8IN (MISCELLANEOUS) IMPLANT
SOLUTION ANTI FOG 6CC (MISCELLANEOUS) IMPLANT
SPONGE GAUZE 4X4 12PLY STER LF (GAUZE/BANDAGES/DRESSINGS) ×2 IMPLANT
SPONGE LAP 18X18 X RAY DECT (DISPOSABLE) IMPLANT
SPONGE LAP 4X18 X RAY DECT (DISPOSABLE) IMPLANT
SUT BONE WAX W31G (SUTURE) ×2 IMPLANT
SUT ETHIBOND X763 2 0 SH 1 (SUTURE) ×4 IMPLANT
SUT MNCRL AB 3-0 PS2 18 (SUTURE) ×6 IMPLANT
SUT MNCRL AB 4-0 PS2 18 (SUTURE) IMPLANT
SUT PDS AB 1 CTX 36 (SUTURE) ×4 IMPLANT
SUT PROLENE 3 0 SH DA (SUTURE) ×8 IMPLANT
SUT PROLENE 3 0 SH1 36 (SUTURE) ×2 IMPLANT
SUT PROLENE 4 0 RB 1 (SUTURE) ×2
SUT PROLENE 4 0 SH DA (SUTURE) IMPLANT
SUT PROLENE 4-0 RB1 .5 CRCL 36 (SUTURE) IMPLANT
SUT PROLENE 5 0 C 1 36 (SUTURE) IMPLANT
SUT PROLENE 6 0 C 1 30 (SUTURE) IMPLANT
SUT PROLENE 7.0 RB 3 (SUTURE) ×6 IMPLANT
SUT PROLENE 8 0 BV175 6 (SUTURE) IMPLANT
SUT PROLENE BLUE 7 0 (SUTURE) ×4 IMPLANT
SUT PROLENE POLY MONO (SUTURE) IMPLANT
SUT SILK  1 MH (SUTURE) ×1
SUT SILK 1 MH (SUTURE) ×1 IMPLANT
SUT STEEL 6MS V (SUTURE) IMPLANT
SUT STEEL STERNAL CCS#1 18IN (SUTURE) ×2 IMPLANT
SUT STEEL SZ 6 DBL 3X14 BALL (SUTURE) ×2 IMPLANT
SUTURE E-PAK OPEN HEART (SUTURE) ×2 IMPLANT
SYSTEM SAHARA CHEST DRAIN ATS (WOUND CARE) ×3 IMPLANT
TAPE CLOTH SURG 4X10 WHT LF (GAUZE/BANDAGES/DRESSINGS) ×1 IMPLANT
TAPE PAPER 3X10 WHT MICROPORE (GAUZE/BANDAGES/DRESSINGS) ×2 IMPLANT
TOWEL OR 17X24 6PK STRL BLUE (TOWEL DISPOSABLE) ×4 IMPLANT
TOWEL OR 17X26 10 PK STRL BLUE (TOWEL DISPOSABLE) ×4 IMPLANT
TRAY FOLEY IC TEMP SENS 16FR (CATHETERS) IMPLANT
TUBING INSUFFLATION (TUBING) ×1 IMPLANT
UNDERPAD 30X30 (UNDERPADS AND DIAPERS) ×1 IMPLANT
WATER STERILE IRR 1000ML POUR (IV SOLUTION) ×4 IMPLANT

## 2016-07-11 SURGICAL SUPPLY — 163 items
ADAPTER CARDIO PERF ANTE/RETRO (ADAPTER) ×6 IMPLANT
ADH SKN CLS APL DERMABOND .7 (GAUZE/BANDAGES/DRESSINGS) ×4
ADPR PRFSN 84XANTGRD RTRGD (ADAPTER) ×4
APPLICATOR COTTON TIP 6IN STRL (MISCELLANEOUS) IMPLANT
APPLIER CLIP 9.375 MED OPEN (MISCELLANEOUS)
APPLIER CLIP 9.375 SM OPEN (CLIP)
APR CLP MED 9.3 20 MLT OPN (MISCELLANEOUS)
APR CLP SM 9.3 20 MLT OPN (CLIP)
BAG DECANTER FOR FLEXI CONT (MISCELLANEOUS) ×6 IMPLANT
BANDAGE ACE 4X5 VEL STRL LF (GAUZE/BANDAGES/DRESSINGS) ×3 IMPLANT
BANDAGE ACE 6X5 VEL STRL LF (GAUZE/BANDAGES/DRESSINGS) ×3 IMPLANT
BLADE CORE FAN STRYKER (BLADE) ×8 IMPLANT
BLADE STERNUM SYSTEM 6 (BLADE) ×3 IMPLANT
BLADE SURG 11 STRL SS (BLADE) ×5 IMPLANT
BLADE SURG ROTATE 9660 (MISCELLANEOUS) ×1 IMPLANT
BNDG GAUZE ELAST 4 BULKY (GAUZE/BANDAGES/DRESSINGS) ×3 IMPLANT
CANISTER SUCTION 2500CC (MISCELLANEOUS) ×6 IMPLANT
CANN PRFSN 3/8X14X24FR PCFC (MISCELLANEOUS)
CANN PRFSN 3/8XCNCT ST RT ANG (MISCELLANEOUS)
CANNULA EZ GLIDE AORTIC 21FR (CANNULA) ×7 IMPLANT
CANNULA FEM VENOUS REMOTE 22FR (CANNULA) ×1 IMPLANT
CANNULA GUNDRY RCSP 15FR (MISCELLANEOUS) ×4 IMPLANT
CANNULA PRFSN 3/8X14X24FR PCFC (MISCELLANEOUS) IMPLANT
CANNULA PRFSN 3/8XCNCT RT ANG (MISCELLANEOUS) IMPLANT
CANNULA SUMP PERICARDIAL (CANNULA) ×2 IMPLANT
CANNULA VEN MTL TIP RT (MISCELLANEOUS)
CANNULA VENNOUS METAL TIP 20FR (CANNULA) ×2 IMPLANT
CATH CPB KIT OWEN (MISCELLANEOUS) ×3 IMPLANT
CATH FOLEY 2WAY SLVR  5CC 14FR (CATHETERS)
CATH FOLEY 2WAY SLVR 5CC 14FR (CATHETERS) IMPLANT
CATH THORACIC 28FR RT ANG (CATHETERS) IMPLANT
CATH THORACIC 36FR (CATHETERS) ×4 IMPLANT
CATH THORACIC 36FR RT ANG (CATHETERS) ×2 IMPLANT
CLIP APPLIE 9.375 MED OPEN (MISCELLANEOUS) IMPLANT
CLIP APPLIE 9.375 SM OPEN (CLIP) IMPLANT
CLIP FOGARTY SPRING 6M (CLIP) IMPLANT
CLIP RETRACTION 3.0MM CORONARY (MISCELLANEOUS) ×3 IMPLANT
CLIP TI MEDIUM 24 (CLIP) IMPLANT
CLIP TI WIDE RED SMALL 24 (CLIP) IMPLANT
CONN 1/2X1/2X1/2  BEN (MISCELLANEOUS) ×1
CONN 1/2X1/2X1/2 BEN (MISCELLANEOUS) ×2 IMPLANT
CONN 3/8X1/2 ST GISH (MISCELLANEOUS) ×8 IMPLANT
CONN ST 1/4X3/8  BEN (MISCELLANEOUS) ×3
CONN ST 1/4X3/8 BEN (MISCELLANEOUS) IMPLANT
CONN Y 3/8X3/8X3/8  BEN (MISCELLANEOUS) ×1
CONN Y 3/8X3/8X3/8 BEN (MISCELLANEOUS) IMPLANT
CONT SPEC 4OZ CLIKSEAL STRL BL (MISCELLANEOUS) ×2 IMPLANT
COUNTER NEEDLE 20 DBL MAG RED (NEEDLE) ×1 IMPLANT
COVER PROBE W GEL 5X96 (DRAPES) ×1 IMPLANT
COVER SURGICAL LIGHT HANDLE (MISCELLANEOUS) ×6 IMPLANT
CRADLE DONUT ADULT HEAD (MISCELLANEOUS) ×6 IMPLANT
DERMABOND ADVANCED (GAUZE/BANDAGES/DRESSINGS) ×2
DERMABOND ADVANCED .7 DNX12 (GAUZE/BANDAGES/DRESSINGS) IMPLANT
DEVICE SUT CK QUICK LOAD MINI (Prosthesis & Implant Heart) ×4 IMPLANT
DRAIN CHANNEL 32F RND 10.7 FF (WOUND CARE) ×9 IMPLANT
DRAPE BILATERAL SPLIT (DRAPES) IMPLANT
DRAPE CARDIOVASCULAR INCISE (DRAPES) ×3
DRAPE CV SPLIT W-CLR ANES SCRN (DRAPES) IMPLANT
DRAPE INCISE IOBAN 66X45 STRL (DRAPES) ×6 IMPLANT
DRAPE SLUSH/WARMER DISC (DRAPES) ×3 IMPLANT
DRAPE SRG 135X102X78XABS (DRAPES) ×2 IMPLANT
DRSG AQUACEL AG ADV 3.5X14 (GAUZE/BANDAGES/DRESSINGS) ×2 IMPLANT
DRSG COVADERM 4X14 (GAUZE/BANDAGES/DRESSINGS) ×6 IMPLANT
ELECT BLADE 4.0 EZ CLEAN MEGAD (MISCELLANEOUS) ×3
ELECT REM PT RETURN 9FT ADLT (ELECTROSURGICAL) ×12
ELECTRODE BLDE 4.0 EZ CLN MEGD (MISCELLANEOUS) ×2 IMPLANT
ELECTRODE REM PT RTRN 9FT ADLT (ELECTROSURGICAL) ×8 IMPLANT
FELT TEFLON 1X6 (MISCELLANEOUS) ×9 IMPLANT
GAUZE SPONGE 4X4 12PLY STRL (GAUZE/BANDAGES/DRESSINGS) ×12 IMPLANT
GLOVE BIO SURGEON STRL SZ 6 (GLOVE) ×3 IMPLANT
GLOVE BIO SURGEON STRL SZ 6.5 (GLOVE) ×7 IMPLANT
GLOVE BIO SURGEON STRL SZ7 (GLOVE) IMPLANT
GLOVE BIO SURGEON STRL SZ7.5 (GLOVE) IMPLANT
GLOVE BIO SURGEON STRL SZ8 (GLOVE) ×1 IMPLANT
GLOVE BIOGEL PI IND STRL 7.0 (GLOVE) IMPLANT
GLOVE BIOGEL PI INDICATOR 7.0 (GLOVE) ×2
GLOVE ORTHO TXT STRL SZ7.5 (GLOVE) ×11 IMPLANT
GOWN STRL REUS W/ TWL LRG LVL3 (GOWN DISPOSABLE) ×16 IMPLANT
GOWN STRL REUS W/TWL LRG LVL3 (GOWN DISPOSABLE) ×24
HEMOSTAT POWDER SURGIFOAM 1G (HEMOSTASIS) ×13 IMPLANT
HEMOSTAT SURGICEL 2X4 FIBR (HEMOSTASIS) ×2 IMPLANT
INSERT FOGARTY 61MM (MISCELLANEOUS) IMPLANT
INSERT FOGARTY XLG (MISCELLANEOUS) ×6 IMPLANT
IV NS IRRIG 3000ML ARTHROMATIC (IV SOLUTION) ×2 IMPLANT
KIT BASIN OR (CUSTOM PROCEDURE TRAY) ×6 IMPLANT
KIT DILATOR VASC 18G NDL (KITS) ×1 IMPLANT
KIT DRAINAGE VACCUM ASSIST (KITS) ×1 IMPLANT
KIT ROOM TURNOVER OR (KITS) ×6 IMPLANT
KIT SUCTION CATH 14FR (SUCTIONS) ×20 IMPLANT
KIT SUT CK MINI COMBO 4X17 (Prosthesis & Implant Heart) ×2 IMPLANT
KIT VASOVIEW HEMOPRO VH 3000 (KITS) ×3 IMPLANT
LEAD PACING MYOCARDI (MISCELLANEOUS) ×4 IMPLANT
LINE VENT (MISCELLANEOUS) ×1 IMPLANT
LOOP VESSEL SUPERMAXI WHITE (MISCELLANEOUS) ×1 IMPLANT
MARKER GRAFT CORONARY BYPASS (MISCELLANEOUS) ×9 IMPLANT
MASK FACE 3 LYR ANT FOG FR FLM (MASK) IMPLANT
MASK SURG FACE ANTI FOG ADLT (MASK) ×3
NS IRRIG 1000ML POUR BTL (IV SOLUTION) ×20 IMPLANT
PACK OPEN HEART (CUSTOM PROCEDURE TRAY) ×6 IMPLANT
PAD ARMBOARD 7.5X6 YLW CONV (MISCELLANEOUS) ×12 IMPLANT
PAD DEFIB R2 (MISCELLANEOUS) ×3 IMPLANT
PAD ELECT DEFIB RADIOL ZOLL (MISCELLANEOUS) ×3 IMPLANT
PENCIL BUTTON HOLSTER BLD 10FT (ELECTRODE) ×3 IMPLANT
PUNCH AORTIC ROTATE 4.0MM (MISCELLANEOUS) IMPLANT
PUNCH AORTIC ROTATE 4.5MM 8IN (MISCELLANEOUS) ×1 IMPLANT
PUNCH AORTIC ROTATE 5MM 8IN (MISCELLANEOUS) IMPLANT
SEALANT PATCH FIBRIN 2X4IN (MISCELLANEOUS) ×1 IMPLANT
SET CARDIOPLEGIA MPS 5001102 (MISCELLANEOUS) ×1 IMPLANT
SET IRRIG TUBING LAPAROSCOPIC (IRRIGATION / IRRIGATOR) IMPLANT
SOLUTION ANTI FOG 6CC (MISCELLANEOUS) IMPLANT
SPONGE GAUZE 4X4 12PLY STER LF (GAUZE/BANDAGES/DRESSINGS) ×2 IMPLANT
SPONGE LAP 18X18 X RAY DECT (DISPOSABLE) ×15 IMPLANT
SPONGE LAP 4X18 X RAY DECT (DISPOSABLE) ×2 IMPLANT
STOPCOCK 4 WAY LG BORE MALE ST (IV SETS) ×1 IMPLANT
SUCKER INTRACARDIAC WEIGHTED (SUCKER) ×3 IMPLANT
SUT BONE WAX W31G (SUTURE) ×4 IMPLANT
SUT ETHIBON 2 0 V 52N 30 (SUTURE) ×2 IMPLANT
SUT ETHIBOND 2 0 SH (SUTURE) ×12 IMPLANT
SUT ETHIBOND 2 0 SH 36X2 (SUTURE) ×4 IMPLANT
SUT ETHIBOND 2 0 V4 (SUTURE) IMPLANT
SUT ETHIBOND 2 0V4 GREEN (SUTURE) IMPLANT
SUT ETHIBOND 4 0 TF (SUTURE) IMPLANT
SUT ETHIBOND 5 0 C 1 30 (SUTURE) ×3 IMPLANT
SUT ETHIBOND X763 2 0 SH 1 (SUTURE) ×6 IMPLANT
SUT MNCRL AB 3-0 PS2 18 (SUTURE) ×6 IMPLANT
SUT MNCRL AB 4-0 PS2 18 (SUTURE) ×1 IMPLANT
SUT PDS AB 1 CTX 36 (SUTURE) ×6 IMPLANT
SUT PROLENE 3 0 SH 1 (SUTURE) ×6 IMPLANT
SUT PROLENE 3 0 SH 48 (SUTURE) ×2 IMPLANT
SUT PROLENE 3 0 SH DA (SUTURE) ×5 IMPLANT
SUT PROLENE 3 0 SH1 36 (SUTURE) IMPLANT
SUT PROLENE 4 0 RB 1 (SUTURE) ×9
SUT PROLENE 4 0 SH DA (SUTURE) ×6 IMPLANT
SUT PROLENE 4-0 RB1 .5 CRCL 36 (SUTURE) ×4 IMPLANT
SUT PROLENE 5 0 C 1 36 (SUTURE) IMPLANT
SUT PROLENE 6 0 C 1 30 (SUTURE) ×10 IMPLANT
SUT PROLENE 7.0 RB 3 (SUTURE) ×9 IMPLANT
SUT PROLENE 8 0 BV175 6 (SUTURE) IMPLANT
SUT PROLENE BLUE 7 0 (SUTURE) ×8 IMPLANT
SUT PROLENE POLY MONO (SUTURE) IMPLANT
SUT SILK  1 MH (SUTURE) ×6
SUT SILK 1 MH (SUTURE) ×7 IMPLANT
SUT SILK 2 0 SH CR/8 (SUTURE) ×1 IMPLANT
SUT STEEL 6MS V (SUTURE) ×1 IMPLANT
SUT STEEL STERNAL CCS#1 18IN (SUTURE) IMPLANT
SUT STEEL SZ 6 DBL 3X14 BALL (SUTURE) ×2 IMPLANT
SUT VIC AB 2-0 CT1 27 (SUTURE) ×3
SUT VIC AB 2-0 CT1 TAPERPNT 27 (SUTURE) ×2 IMPLANT
SUT VIC AB 2-0 CTX 27 (SUTURE) IMPLANT
SUTURE E-PAK OPEN HEART (SUTURE) ×3 IMPLANT
SYSTEM SAHARA CHEST DRAIN ATS (WOUND CARE) ×4 IMPLANT
TAPE CLOTH SURG 4X10 WHT LF (GAUZE/BANDAGES/DRESSINGS) ×1 IMPLANT
TAPE PAPER 2X10 WHT MICROPORE (GAUZE/BANDAGES/DRESSINGS) ×1 IMPLANT
TOWEL OR 17X24 6PK STRL BLUE (TOWEL DISPOSABLE) ×10 IMPLANT
TOWEL OR 17X26 10 PK STRL BLUE (TOWEL DISPOSABLE) ×7 IMPLANT
TRAY CATH LUMEN 1 20CM STRL (SET/KITS/TRAYS/PACK) ×1 IMPLANT
TRAY FOLEY IC TEMP SENS 16FR (CATHETERS) ×5 IMPLANT
TUBING ART PRESS 48 MALE/FEM (TUBING) ×4 IMPLANT
TUBING INSUFFLATION (TUBING) ×3 IMPLANT
TUBING INSUFFLATION 10FT LAP (TUBING) ×2 IMPLANT
UNDERPAD 30X30 (UNDERPADS AND DIAPERS) ×4 IMPLANT
VALVE MAGNA MITRAL 31MM (Prosthesis & Implant Heart) ×1 IMPLANT
WATER STERILE IRR 1000ML POUR (IV SOLUTION) ×10 IMPLANT

## 2016-07-11 NOTE — Progress Notes (Signed)
Patient with small bruise to sacrum, sacral dressing placed per protocol.

## 2016-07-11 NOTE — Op Note (Addendum)
CARDIOTHORACIC SURGERY OPERATIVE NOTE  Date of Procedure:   07/11/2016  Preoperative Diagnosis:  Bleeding s/p Redo Coronary Artery Bypass Grafting and Mitral Valve Replacement  Postoperative Diagnosis:  same  Procedure:    Re-exploration for Bleeding  Surgeon:    Valentina Gu. Roxy Manns, MD  Assistant:    Eugenie Birks, CRNFA  Anesthesia:    Albertha Ghee, MD  Operative Findings:   Bleeding from the medial surface of the left upper lobe along the left internal mammary artery pedicle     BRIEF CLINICAL NOTE AND INDICATIONS FOR SURGERY  Patient is a 70 year old male who underwent redo coronary artery bypass grafting 2 and mitral valve replacement. Upon arrival in the surgical intensive care unit the patient was noted to have an excessive amount of blood draining from his chest tubes despite correction of coagulopathy. Without delay the patient is prepared to return to the operating room for surgical reexploration.     DETAILS OF THE OPERATIVE PROCEDURE  The patient is brought to the operating room on the above mentioned date and placed in the supine position on the table. Gen. endotracheal anesthesia is monitored and maintained under the care and direction of Dr. Marcie Bal.  The patient's anterior chest is prepared and draped in a sterile manner. The chest tubes are left in place. The previous sternotomy incision is reopened. The sternal wires are removed. A retractor is placed in the mediastinum is explored. There is a considerable amount of bleeding and hematoma between the medial surface of the left upper lobe and the mediastinum. All the hematomas evacuated. There is active bleeding from a torn surface of the lung with appearance suggesting that with inflation of the lung the previous repaired area had torn and began bleeding considerably. Once all the blood was evacuated and the raw surface of the lung could be carefully visualized, the lung was repaired using a series of interrupted  horizontal mattress 3-0 Prolene pledgeted sutures. The sutures are each tied while ventilation is held. This controls the significant active bleeding. The mediastinum is irrigated with copious saline solution and carefully examined for any further surgical sites of bleeding. There appears to be no significant bleeding and no residual coagulopathy. The sternum is reclosed using double strength sternal wire. Soft tissues anterior to the sternum were closed in multiple layers and the skin is closed with a subcuticular skin closure.  The patient tolerated the procedure remarkably well and was transported back to the surgical intensive care unit in stable condition. The patient was transfused a total 4 units packed red blood cells for acute blood loss anemia during the procedure.     Valentina Gu. Roxy Manns MD 07/11/2016 8:37 PM

## 2016-07-11 NOTE — Transfer of Care (Signed)
Immediate Anesthesia Transfer of Care Note  Patient: Christian Bowman  Procedure(s) Performed: Procedure(s): Re-exploration (CABG) for post op bleeding, (N/A)  Patient Location: ICU  Anesthesia Type:General  Level of Consciousness: Patient remains intubated per anesthesia plan  Airway & Oxygen Therapy: Patient remains intubated per anesthesia plan and Patient placed on Ventilator (see vital sign flow sheet for setting)  Post-op Assessment: Report given to RN and Post -op Vital signs reviewed and stable  Post vital signs: Reviewed and stable  Last Vitals:  Vitals:   07/11/16 1745 07/11/16 1800  BP:    Pulse: 84 84  Resp: 12 (!) 24  Temp: (!) 35.4 C     Last Pain:  Vitals:   07/11/16 1745  TempSrc: Core (Comment)         Complications: No apparent anesthesia complications

## 2016-07-11 NOTE — Op Note (Addendum)
CARDIOTHORACIC SURGERY OPERATIVE NOTE  Date of Procedure:  07/11/2016  Preoperative Diagnosis:   Left Main and 3-vessel Coronary Artery Disease  Vein Graft Disease s/p Coronary Artery Bypass Grafting  Unstable Angina Pectoris  Ischemic Cardiomyopathy  Severe Secondary Mitral Regurgitation  Chronic Combined Systolic and Diastolic Congestive Heart Failure  Postoperative Diagnosis: Same  Procedure:     Redo Coronary Artery Bypass Grafting x 2   Redo Median Sternotomy  Saphenous Vein Graft to Posterior Descending Coronary Artery  Saphenous Vein Graft to Intermediate Branch Coronary Artery  Endoscopic Vein Harvest from Left Thigh   Placement of Femoral Arterial Line   Mitral Valve Replacement  Edwards Magna Mitral Bovine Bioprosthetic Tissue Valve (size 33mm, model #7300TFX, serial [id number])   Surgeon: Valentina Gu. Roxy Manns, MD  Assistant: Ellwood Handler, PA-C  Anesthesia: Roberts Gaudy, MD  Operative Findings:  Ischemic cardiomyopathy with severe LV systolic dysfunction, EF [id number]  Moderate pulmonary hypertension  Severe LV chamber enlargement  Severe global hypokinesis with akinesis of postero basal and lateral wall of LV  Type IIIB mitral valve dysfunction with severe mitral regurgitation  Normal RV size and systolic function  Large caliber but otherwise good quality SVG conduit for grafting  Poor quality intermediate branch target for grafting  Good quality posterior descending branch target for grafting  Dense pericardial and mediastinal adhesions     BRIEF CLINICAL NOTE AND INDICATIONS FOR SURGERY  Patient is a 70 year old male with history of coronary artery disease status post coronary artery bypass grafting 4 in 2007, ischemic cardiomyopathy with chronic combined systolic and diastolic congestive heart failure, mitral regurgitation, hypertension, type 2 diabetes mellitus, and hyperlipidemia who has been referred for surgical consultation to discuss  treatment options for management of severe multivessel coronary artery disease, vein graft disease, and ischemic mitral regurgitation. The patient's cardiac history dates back to 2007 when he originally presented with hypertensive crisis with severe hypertension. He was found to have renal artery stenosis and left main and three-vessel coronary artery disease. He underwent coronary artery bypass grafting 4 with grafts placed at time of surgery including left internal mammary artery to the distal left anterior descending coronary artery, saphenous vein graft to the ramus intermediate branch, and sequential saphenous vein graft to the posterior descending coronary artery and the right posterior lateral branch. Postoperatively he developed atrial fibrillation which ultimately required cardioversion. His postoperative recovery was otherwise uneventful. He did well until September 2016 when he presented with acute respiratory failure secondary to acute pulmonary edema from congestive heart failure. At the time he was traveling in [place]. He was hospitalized there where by report echocardiogram revealed ejection fraction 35% with moderate to severe mitral regurgitation. He underwent cardiac catheterization and was found to have 100% occlusion of all of the main native coronary arteries with continued patency of the left internal mammary artery graft to the distal left anterior descending coronary artery, 100% occlusion of the vein graft to the left circumflex system, and 90% stenosis of an otherwise patent vein graft to the right coronary system. He underwent PCI and stenting of the vein graft to the right coronary system using a drug eluting stent. Since then the patient has had persistent symptoms of chronic combined systolic and congestive heart failure, and he has been hospitalized on 2 different occasions in [place] for acute exacerbations of class IV symptoms. The patient was  recently seen in follow-up by Dr. Sallyanne Kuster who performed transesophageal echocardiogram on 06/15/2016. TEE revealed moderate to severe left ventricular chamber enlargement  with ejection fraction estimated 35-40%. There was severe hypokinesis of the anterolateral and inferolateral myocardium. There was akinesis of the basal mid inferior myocardium. There was severe mitralregurgitation with an eccentric jet of regurgitation directed posteriorly and flow reversal in the pulmonary veins. There was moderate left atrial enlargement. Left and right heart catheterization was performed 06/21/2016 by Dr. Burt Knack. This confirmed the presence of severe left main and three-vessel coronary artery disease with 100% occlusion of all of the native artery arteries. There was continued patency of the left internal mammary artery to the distal left anterior descending coronary artery, although severe disease in the native left anterior descending coronary artery beyond the distal insertion of the graft. There was chronic occlusion of vein graft to the left circumflex system. There was continued patency of vein graft to the right coronarysystem with high-grade 99% stenosis in the midportion of the graft at the site of previous stent insertion. There was mild to moderate pulmonary hypertension with very large V waves on wedge tracing consistent with severe mitral regurgitation. The patient was referred for surgical consultation. The patient has been seen in consultation and counseled at length regarding the indications, risks and potential benefits of surgery.  All questions have been answered, and the patient provides full informed consent for the operation as described.    DETAILS OF THE OPERATIVE PROCEDURE  Preparation:  The patient is brought to the operating room on the above mentioned date and central monitoring was established by the anesthesia team including placement of Swan-Ganz catheter and radial arterial line. The  patient is noted to have moderate pulmonary hypertension at baseline. The patient is placed in the supine position on the operating table.  Intravenous antibiotics are administered. General endotracheal anesthesia is induced uneventfully. A Foley catheter is placed.  Baseline transesophageal echocardiogram was performed.  Findings were notable for severe left ventricular systolic dysfunction. The left ventricle was extremely dilated. There is severe global hypokinesis with akinesis in several areas including the high posterior basal region, the anterolateral wall, and the apex. Ejection fraction is estimated 20-25%. There is classical ischemic mitral regurgitation secondary to a combination of severe systolic restriction of posterior leaflet with downward displacement and tenting of the subvalvular apparatus as well as some degree of annular dilatation. The left atrium was dilated. There was severe mitral regurgitation. The jet of regurgitation was eccentric. There was flow reversal in 2 of 4 of the pulmonary veins. The aortic valve appeared normal. Right ventricular size and function appeared normal. There was trace tricuspid regurgitation.  The patient's chest, abdomen, both groins, and both lower extremities are prepared and draped in a sterile manner. A time out procedure is performed.    Surgical Approach and Conduit Harvest:  The greater saphenous vein is obtained from the patient's left thigh using endoscopic vein harvest technique. The saphenous vein is notably large caliber but otherwise good quality conduit. After removal of the saphenous vein, the small surgical incisions in the lower extremity are closed with absorbable suture.   A redo median sternotomy incision was performed.  All of the sternal wires are removed and the sternum is divided using an oscillating saw. Sternal reentry is uneventful. Dissection is continued using a combination of sharp dissection and electrocautery to free up  the undersurface of the sternum from the structures of the anterior mediastinum. Dissection is continued in both directions until both the left and right pleural spaces are entered. A retractor is placed. Dissection is continued anteriorly to expose the anterior  surface of the ascending aorta. The aorta is somewhat thick-walled but otherwise fairly normal in appearance. The last remaining patent saphenous vein graft to the right coronary system is identified and carefully avoided.   Extracorporeal Cardiopulmonary Bypass and Myocardial Protection:  An 18-gauge femoral arterial line is placed in the right common femoral artery using ultrasound guidance and the Seldinger technique for monitoring purposes. The femoral arterial line is transduced continuously throughout the procedure. The right common femoral vein is cannulated using the Seldinger technique and a guidewire advanced into the right atrium using TEE guidance.  The patient is heparinized systemically and the right common femoral vein cannulated using a 22 Fr long femoral venous cannula.  The ascending aorta is cannulated for cardiopulmonary bypass.  Adequate heparinization is verified.     The entire pre-bypass portion of the operation was notable for stable hemodynamics.  Cardiopulmonary bypass is begun. With the heart decompressed dissection is continued in the mediastinum to free up the surfaces surrounding the right atrium and the inferior wall of the right ventricle. Care is taken to completely avoid the patent vein graft to the right coronary system.  A retrograde cardioplegia cannula is placed through the right atrium into the coronary sinus.  A second venous cannula is placed directly into the superior vena cava.   The ascending aorta is dissected away from the pulmonary artery as much as possible. The chronically occluded vein graft to the intermediate branch is hard and calcified and makes dissection tedious. Dissection is extended into the  left pleural space and the lung is dissected away from left internal mammary artery graft. There are dense adhesions surrounding the left internal mammary artery graft causing it to remain attached and completely adherent to the left upper lobe. A cardioplegia cannula is placed in the ascending aorta.  A temperature probe was placed in the interventricular septum.  The operative field is continuously flooded with carbon dioxide.  The patient is cooled to 28C systemic temperature.  The aortic cross clamp is applied and cold blood cardioplegia is delivered initially in an antegrade fashion through the aortic root.  An atraumatic bulldog clamp was applied to the left internal mammary artery graft pedicle.  Supplemental cardioplegia is given retrograde through the coronary sinus catheter.  Iced saline slush is applied for topical hypothermia.  The initial cardioplegic arrest is rapid with early diastolic arrest.  Repeat doses of cardioplegia are administered intermittently throughout the entire cross clamp portion of the operation through the aortic root, through subsequently placed vein grafts, and through the coronary sinus catheter in order to maintain completely flat electrocardiogram and septal myocardial temperature below 15C.  Myocardial protection was felt to be excellent.  With the heart arrested and completely protected the remainder of the dissection is completed circumferentially around the surface of the heart in order to dissect the remaining patent vein graft to the right cornea system and to free up the entire epicardial surface of the heart to expose the high lateral wall. There is clear evidence for multiple previous transmural myocardial infarctions including the lateral wall and the distal posterior lateral wall and basal portion supplied by the posterior lateral branch of the distal right coronary artery.   Redo Coronary Artery Bypass Grafting:   The ramus intermediate branch coronary  artery was grafted using a reversed saphenous vein graft in an end-to-side fashion.  At the site of distal anastomosis the target vessel was intramyocardial and only fair to poor quality and measured approximately 1.3 mm in  diameter.  The posterior descending branch of the right coronary artery was grafted using a reversed saphenous vein graft in an end-to-side fashion.  At the site of distal anastomosis the target vessel was good quality and measured approximately 1.5 mm in diameter.  The distal anastomosis is placed immediately distal to the previous distal anastomosis from the patient's original surgery. The diseased but patent vein graft to the right coronary system from the patient's previous surgery was left intact.   Mitral Valve Replacement:  A left atriotomy incision was performed through the interatrial groove and extended partially across the back wall of the left atrium after opening the oblique sinus inferiorly.  The mitral valve is exposed using a self-retaining retractor.  The mitral valve was inspected and notable for normal appearing leaflets. The annulus is dilated. Both papillary muscles were infarcted.   A decision is made to proceed with mitral valve replacement using chord preserving technique due to the massive chamber enlargement of the left ventricle with severe downward displacement of the subvalvular apparatus and chronically infarcted papillary muscles and high posterior lateral wall.  The majority of the anterior leaflet of the mitral valve is excised sharply leaving a thin rim along the free margin in order to preserve all of the primary chordae tendineae.  The free margin and associated cords are split in the midline and incorporated into the suture line for mitral valve replacement after tacking them in place laterally using 4-0 Prolene suture.  The posterior mitral leaflet is split in the midline in order to preserve all of the subvalvular apparatus.  Mitral valve  replacement is performed using interrupted 2-0 Ethibond horizontal mattress pledgeted sutures with pledgets in the supra-annular position.   The valve was sized to a 31 mm bioprosthetic tissue valve.  An [facility] Mitral bovine pericardial bioprosthetic valve (size 67mm, model #7300TFX, serial [id number]) was secured in place uneventfully. Care was taken to orient the valve such that the stent post were not extending into the left ventricular outflow tract. All sutures were secured using a Cor-knot device. The valve was tested with saline and appeared competent.   Rewarming is begun.   Procedure Completion:  The atriotomy was closed using a 2-layer closure of running 3-0 Prolene suture after placing a sump drain across the mitral valve to serve as a left ventricular vent.  Both proximal vein graft anastomoses were placed directly to the ascending aorta prior to removal of the aortic cross clamp.  The septal myocardial temperature rose rapidly after reperfusion of the left internal mammary artery graft.  One final dose of warm retrograde "hot shot" cardioplegia was administered retrograde through the coronary sinus catheter while all air was evacuated through the aortic root.  The aortic cross clamp was removed after a total cross clamp time of 177 minutes.  Epicardial pacing wires are fixed to the right ventricular outflow tract and to the right atrial appendage. The patient is rewarmed to 37C temperature. The left ventricular vent is removed.  The superior vena cava cannula is removed. The patient is weaned and disconnected from cardiopulmonary bypass.  The patient's rhythm at separation from bypass was AV paced.  The patient was weaned from cardioplegic bypass on milrinone at 0.375 mcg/kg/min and low dose levophed infusions. Total cardiopulmonary bypass time for the operation was 236 minutes.  Followup transesophageal echocardiogram performed after separation from bypass revealed a well-seated  bioprosthetic tissue valve in the mitral position that was functioning normally. There was no mitral regurgitation. There is  no paravalvular leak. Left ventricular function was unchanged from preoperatively.  The aortic cannula was removed uneventfully. Protamine was administered to reverse the anticoagulation. The femoral venous cannula was removed and manual pressure held on the groin for 30 minutes.  The mediastinum and pleural spaces were inspected for hemostasis and irrigated with saline solution.  There is significant coagulopathy. The patient received a total of 4 packs adult platelets, 4 units fresh frozen plasma, and a 10-pack of cryoprecipitate due to coagulopathy and thrombocytopenia after separation from cardiopulmonary bypass and reversal of heparin with protamine.  Coagulopathy seems to resolve.  The mediastinum and both pleural spaces were drained using 5 chest tubes placed through separate stab incisions inferiorly.  To the tubes are placed in the left pleural space with one located medially along the surface area of the left upper lobe where there was dissection between the lung and the left internal mammary artery graft.  The soft tissues anterior to the aorta were reapproximated loosely. The sternum is closed with double strength sternal wire. The soft tissues anterior to the sternum were closed in multiple layers and the skin is closed with a running subcuticular skin closure.  The post-bypass portion of the operation was notable for stable rhythm and hemodynamics.   Disposition:  The patient tolerated the procedure well.  The patient was transported to the surgical intensive care unit in stable condition. There were no intraoperative complications. All sponge instrument and needle counts are verified correct at completion of the operation.    Valentina Gu. Roxy Manns MD 07/11/2016 4:41 PM

## 2016-07-11 NOTE — Progress Notes (Signed)
Received patient from OR. Pt bleeding. Dr. Roxy Manns at bedside, made aware. Pt returned to OR at 1800.

## 2016-07-11 NOTE — Anesthesia Procedure Notes (Signed)
Procedures

## 2016-07-11 NOTE — CV Procedure (Signed)
Intra-operative Transesophageal Echocardiography Report:  Christian Bowman is a 70 year old male with severe ischemic cardiomyopathy, chronic combined systolic and diastolic congestive heart failure, severe mitral regurgitation, hypertension, type 2 diabetes mellitus, and hyper lipidemia who is scheduled to undergo redo coronary artery bypass grafting and mitral valve repair or replacement by Dr. Roxy Manns. Intraoperative transesophageal echocardiography was indicated to evaluate the mitral valve and to assist with the procedure, to serve as a monitor for intraoperative volume status and cardiac function, and to assess for any other valvular pathology.  The patient was brought to the operating room at [facility] and general anesthesia was induced without difficulty. Following endotracheal intubation and orogastric suctioning, the transesophageal echocardiography probe was inserted in the esophagus without difficulty.  Impression Pre-bypass Findings:  1. Aortic valve: The aortic valve appeared normal. The leaflets were thin and pliable and opened with normal leaflet separation. There was no aortic insufficiency.  2. Mitral valve: There appeared to be moderate to severe mitral regurgitation. There was a jet of mitral regurgitation which appeared to originate in the region of the posterior medial commissure and was posteriorly directed. This resulted in a wall hugging jet along the posterior aspect of the left atrium. There was flow reversal in the right upper pulmonary vein in systole. In the left upper pulmonary vein, there was blunting of systolic blunting of systolic forward flow. The mitral regurgitation appeared to result from tethering of the mitral apparatus due to left ventricular enlargement and aneurysmal dilatation of the inferior basal region of the left ventricle. There were no prolapsing or flail mitral segments noted.  3. Left ventricle: There was moderate left ventricular dysfunction with an  estimated ejection fraction of 30-35%. The left ventricular cavity was markedly enlarged and measured 7.8 cm at end diastole at the mid-papillary level in the transgastric short axis view. There was aneurysmal dilatation and dyskinesis of the inferior basilar region. Global assessment of left ventricle was difficult due to the orientation of the heart within the patient's chest which made in the usual imaging planes difficult to obtain. However the mid and apical anterior and anteroseptal regions appear to be contracting adequately. The lateral wall was hypokinetic There was no thrombus noted within the left ventricular cavity.  4. Right ventricle: The right ventricular cavity was of normal size. There was normal contractility of the right ventricular free wall and normal appearing right ventricle are systolic function.  5. Tricuspid valve: The tricuspid valve appeared structurally normal. There was 1+ tricuspid insufficiency.  6. Interatrial septum: There was no evidence of patent foramen ovale or atrial septal defect by color Doppler and bubble study.  7. Left atrium: There was no thrombus noted within the left atrium or left atrial appendage.   8. Ascending aorta: The ascending aorta did not appear dilated or aneurysmal. There was a well-defined aortic root and sinotubular ridge without effacement. There was moderate thickening of the wall the ascending aorta but no protruding atheromatous disease noted.   Post-bypass Findings:  1. Aortic valve: The aortic valve was unchanged from the pre-bypass study and appeared normal. The leaflets opened without restriction and there was no aortic insufficiency.  2. Mitral valve: There was a bioprosthetic  valve in the mitral position. The leaflets appeared to open normally. There was 1+ mitral insufficiency with a central jet which is normal for a bioprosthetic mitral valve.  Continuous-wave Doppler interrogation of mitral inflow revealed a mean transmitral  gradient of 3 mmHg. There was no perivalvular insufficiency noted.  4.  Left ventricle: There was marked left ventricular enlargement and left ventricular dysfunction which appeared unchanged from the pre-bypass study. The ejection fraction was estimated at 30%.  4. Right ventricle: There appeared to be adequate right ventricular systolic function and the right ventricular cavity was not enlarged.  5. Tricuspid valve: There was 1+ tricuspid insufficiency which appeared unchanged from the pre-bypass study.  Roberts Gaudy, M.D.

## 2016-07-11 NOTE — Progress Notes (Signed)
  Echocardiogram Echocardiogram Transesophageal has been performed.  Darlina Sicilian M 07/11/2016, 9:43 AM

## 2016-07-11 NOTE — Brief Op Note (Addendum)
07/11/2016  1:41 PM  PATIENT:  Christian Bowman  69 y.o. male  PRE-OPERATIVE DIAGNOSIS:  CAD MR  POST-OPERATIVE DIAGNOSIS:  CAD MR  PROCEDURE:  Procedure(s):  REDO STERNOTOMY  REDO CORONARY ARTERY BYPASS GRAFTING x 2 -SVG to PDA -SVG to RAMUS INTERMEDIATE  MITRAL VALVE REPLACEMENT (N/A) -31 mm Edwards Magna Mitral Ease   SURGEON:    Rexene Alberts, MD  ASSISTANTS:  Ellwood Handler, PA-C  ANESTHESIA:   Rica Koyanagi, MD  CROSSCLAMP TIME:   15'  CARDIOPULMONARY BYPASS TIME: 236'  FINDINGS:  Ischemic cardiomyopathy with severe LV systolic dysfunction  Moderate pulmonary hypertension  Severe LV chamber enlargement  Severe global hypokinesis with akinesis of postero basal and lateral wall of LV  Type IIIB mitral valve dysfunction with severe mitral regurgitation  Normal RV size and systolic function  Large caliber but otherwise good quality SVG conduit for grafting  Poor quality intermediate branch target for grafting  Good quality posterior descending branch target for grafting  COMPLICATIONS: None  BASELINE WEIGHT: 81 kg  PATIENT DISPOSITION:   TO SICU IN STABLE CONDITION  Rexene Alberts, MD 07/11/2016 4:34 PM

## 2016-07-11 NOTE — Anesthesia Preprocedure Evaluation (Addendum)
Anesthesia Evaluation  Patient identified by MRN, date of birth, ID band Patient awake    Reviewed: Allergy & Precautions, NPO status , Patient's Chart, lab work & pertinent test results  Airway Mallampati: II  TM Distance: >3 FB Neck ROM: Full    Dental  (+) Teeth Intact, Dental Advisory Given   Pulmonary    breath sounds clear to auscultation       Cardiovascular hypertension,  Rhythm:Regular Rate:Normal     Neuro/Psych    GI/Hepatic   Endo/Other  diabetes  Renal/GU      Musculoskeletal   Abdominal   Peds  Hematology   Anesthesia Other Findings R neck mass, soft  Reproductive/Obstetrics                            Anesthesia Physical Anesthesia Plan  ASA: IV  Anesthesia Plan: General   Post-op Pain Management:    Induction: Intravenous  Airway Management Planned: Oral ETT  Additional Equipment:   Intra-op Plan:   Post-operative Plan: Post-operative intubation/ventilation  Informed Consent: I have reviewed the patients History and Physical, chart, labs and discussed the procedure including the risks, benefits and alternatives for the proposed anesthesia with the patient or authorized representative who has indicated his/her understanding and acceptance.     Plan Discussed with: CRNA and Anesthesiologist  Anesthesia Plan Comments:         Anesthesia Quick Evaluation

## 2016-07-11 NOTE — Transfer of Care (Signed)
Immediate Anesthesia Transfer of Care Note  Patient: Christian Bowman  Procedure(s) Performed: Procedure(s): REDO CORONARY ARTERY BYPASS GRAFTING (CABG) x two using left leg greater saphenous vein harvested endoscopically-SVG to PDA -SVG to RAMUS INTERMEDIATE (N/A) TRANSESOPHAGEAL ECHOCARDIOGRAM (TEE) (N/A) MITRAL VALVE (MV) REPLACEMENT (N/A)  Patient Location: SICU  Anesthesia Type:General  Level of Consciousness: Patient remains intubated per anesthesia plan  Airway & Oxygen Therapy: Patient remains intubated per anesthesia plan  Post-op Assessment: Report given to RN and Post -op Vital signs reviewed and unstable, Anesthesiologist notified  Post vital signs: Reviewed and unstable  Last Vitals:  Vitals:   07/11/16 1720 07/11/16 1730  BP: (!) 81/38   Pulse: 85 84  Resp: 12 (!) 7  Temp:  (!) 35.9 C    Last Pain:  Vitals:   07/11/16 0643  TempSrc: Oral         Complications: pt bleeding from CTs. surgeon at bedside

## 2016-07-11 NOTE — Anesthesia Procedure Notes (Signed)
Procedure Name: Intubation Date/Time: 07/11/2016 7:48 AM Performed by: Clearnce Sorrel Pre-anesthesia Checklist: Patient identified, Emergency Drugs available, Suction available, Patient being monitored and Timeout performed Patient Re-evaluated:Patient Re-evaluated prior to inductionOxygen Delivery Method: Circle system utilized Preoxygenation: Pre-oxygenation with 100% oxygen Intubation Type: IV induction Ventilation: Mask ventilation without difficulty, Oral airway inserted - appropriate to patient size and Two handed mask ventilation required Laryngoscope Size: Mac and 4 Grade View: Grade I Tube type: Subglottic suction tube Tube size: 8.0 mm Number of attempts: 1 Airway Equipment and Method: Stylet Placement Confirmation: ETT inserted through vocal cords under direct vision,  positive ETCO2 and breath sounds checked- equal and bilateral Secured at: 23 cm Tube secured with: Tape Dental Injury: Teeth and Oropharynx as per pre-operative assessment

## 2016-07-11 NOTE — Progress Notes (Signed)
TCTS BRIEF SICU PROGRESS NOTE  Day of Surgery  S/P Procedure(s) (LRB): REDO CORONARY ARTERY BYPASS GRAFTING (CABG) x two using left leg greater saphenous vein harvested endoscopically-SVG to PDA -SVG to RAMUS INTERMEDIATE (N/A) TRANSESOPHAGEAL ECHOCARDIOGRAM (TEE) (N/A) MITRAL VALVE (MV) REPLACEMENT (N/A)   Patient arrived to SICU with 500 mL blood in Pleur-evac and additional 200 mL out within 30 minutes.  He has remained hemodynamically stable.  Plan: Return to OR for re-exploration.  Discussed plans with patient's wife.  Rexene Alberts, MD 07/11/2016 5:49 PM

## 2016-07-11 NOTE — Interval H&P Note (Signed)
History and Physical Interval Note:  07/11/2016 6:27 AM  Christian Bowman  has presented today for surgery, with the diagnosis of CAD MR  The various methods of treatment have been discussed with the patient and family. After consideration of risks, benefits and other options for treatment, the patient has consented to  Procedure(s): REDO CORONARY ARTERY BYPASS GRAFTING (CABG) (N/A) MITRAL VALVE REPAIR OR REPLACEMENT (MVR) (N/A) TRANSESOPHAGEAL ECHOCARDIOGRAM (TEE) (N/A) STANDBY OF PLACEMENT OF CENTRIMAG VENTRICULAR ASSIST DEVICE (N/A) as a surgical intervention .  The patient's history has been reviewed, patient examined, no change in status, stable for surgery.  I have reviewed the patient's chart and labs.  Questions were answered to the patient's satisfaction.     Rexene Alberts

## 2016-07-11 NOTE — Anesthesia Preprocedure Evaluation (Addendum)
Anesthesia Evaluation  Patient identified by MRN, date of birth, ID band Patient unresponsive  Preop documentation limited or incomplete due to emergent nature of procedure.  Airway Mallampati: Intubated       Dental   Pulmonary  Intubated.    + decreased breath sounds      Cardiovascular hypertension, + angina + CAD, + Past MI and + Peripheral Vascular Disease  + Valvular Problems/Murmurs MR  Rhythm:regular Rate:Normal  S/p CABG, MVR earlier today.  Now bleeding and needs to re-open.   Neuro/Psych    GI/Hepatic   Endo/Other  diabetes, Type 2  Renal/GU      Musculoskeletal   Abdominal   Peds  Hematology  (+) anemia ,   Anesthesia Other Findings   Reproductive/Obstetrics                             Anesthesia Physical Anesthesia Plan  ASA: IV and emergent  Anesthesia Plan: General   Post-op Pain Management:    Induction: Intravenous  Airway Management Planned: Oral ETT  Additional Equipment: Arterial line, CVP and PA Cath  Intra-op Plan:   Post-operative Plan: Post-operative intubation/ventilation  Informed Consent:   Plan Discussed with: CRNA, Anesthesiologist and Surgeon  Anesthesia Plan Comments:        Anesthesia Quick Evaluation

## 2016-07-12 ENCOUNTER — Encounter (HOSPITAL_COMMUNITY): Payer: Self-pay | Admitting: Thoracic Surgery (Cardiothoracic Vascular Surgery)

## 2016-07-12 ENCOUNTER — Inpatient Hospital Stay (HOSPITAL_COMMUNITY): Payer: Medicare Other

## 2016-07-12 LAB — BASIC METABOLIC PANEL
Anion gap: 7 (ref 5–15)
BUN: 17 mg/dL (ref 6–20)
CHLORIDE: 109 mmol/L (ref 101–111)
CO2: 23 mmol/L (ref 22–32)
CREATININE: 1.01 mg/dL (ref 0.61–1.24)
Calcium: 7.7 mg/dL — ABNORMAL LOW (ref 8.9–10.3)
GFR calc Af Amer: >60 mL/min (ref >60)
GFR calc non Af Amer: >60 mL/min (ref >60)
Glucose, Bld: 130 mg/dL — ABNORMAL HIGH (ref 65–99)
Potassium: 3.5 mmol/L (ref 3.5–5.1)
Sodium: 139 mmol/L (ref 135–145)

## 2016-07-12 LAB — POCT I-STAT, CHEM 8
BUN: 16 mg/dL (ref 6–20)
BUN: 17 mg/dL (ref 6–20)
BUN: 18 mg/dL (ref 6–20)
CALCIUM ION: 1.1 mmol/L — AB (ref 1.15–1.40)
CALCIUM ION: 1.11 mmol/L — AB (ref 1.15–1.40)
CHLORIDE: 105 mmol/L (ref 101–111)
CREATININE: 0.8 mg/dL (ref 0.61–1.24)
CREATININE: 0.8 mg/dL (ref 0.61–1.24)
CREATININE: 1.1 mg/dL (ref 0.61–1.24)
Calcium, Ion: 1.19 mmol/L (ref 1.15–1.40)
Chloride: 104 mmol/L (ref 101–111)
Chloride: 103 mmol/L (ref 101–111)
GLUCOSE: 174 mg/dL — AB (ref 65–99)
GLUCOSE: 173 mg/dL — AB (ref 65–99)
GLUCOSE: 101 mg/dL — AB (ref 65–99)
HCT: 24 % — ABNORMAL LOW (ref 39.0–52.0)
HCT: 26 % — ABNORMAL LOW (ref 39.0–52.0)
HCT: 28 % — ABNORMAL LOW (ref 39.0–52.0)
HEMOGLOBIN: 8.2 g/dL — AB (ref 13.0–17.0)
HEMOGLOBIN: 8.8 g/dL — AB (ref 13.0–17.0)
Hemoglobin: 9.5 g/dL — ABNORMAL LOW (ref 13.0–17.0)
POTASSIUM: 4.4 mmol/L (ref 3.5–5.1)
POTASSIUM: 3.9 mmol/L (ref 3.5–5.1)
Potassium: 4 mmol/L (ref 3.5–5.1)
Sodium: 143 mmol/L (ref 135–145)
Sodium: 143 mmol/L (ref 135–145)
Sodium: 141 mmol/L (ref 135–145)
TCO2: 23 mmol/L (ref 0–100)
TCO2: 24 mmol/L (ref 0–100)
TCO2: 23 mmol/L (ref 0–100)

## 2016-07-12 LAB — PREPARE FRESH FROZEN PLASMA
UNIT DIVISION: 0
UNIT DIVISION: 0
UNIT DIVISION: 0
UNIT DIVISION: 0
Unit division: 0
Unit division: 0

## 2016-07-12 LAB — POCT I-STAT 3, ART BLOOD GAS (G3+)
ACID-BASE DEFICIT: 4 mmol/L — AB (ref 0.0–2.0)
ACID-BASE DEFICIT: 3 mmol/L — AB (ref 0.0–2.0)
ACID-BASE DEFICIT: 2 mmol/L (ref 0.0–2.0)
Acid-base deficit: 1 mmol/L (ref 0.0–2.0)
Acid-base deficit: 3 mmol/L — ABNORMAL HIGH (ref 0.0–2.0)
Acid-base deficit: 1 mmol/L (ref 0.0–2.0)
Acid-base deficit: 2 mmol/L (ref 0.0–2.0)
BICARBONATE: 23.6 mmol/L (ref 20.0–28.0)
BICARBONATE: 22.5 mmol/L (ref 20.0–28.0)
BICARBONATE: 23.3 mmol/L (ref 20.0–28.0)
Bicarbonate: 25.1 mmol/L (ref 20.0–28.0)
Bicarbonate: 23.5 mmol/L (ref 20.0–28.0)
Bicarbonate: 23.4 mmol/L (ref 20.0–28.0)
Bicarbonate: 21.7 mmol/L (ref 20.0–28.0)
O2 SAT: 92 %
O2 SAT: 100 %
O2 SAT: 91 %
O2 Saturation: 93 %
O2 Saturation: 94 %
O2 Saturation: 95 %
O2 Saturation: 93 %
PCO2 ART: 48.7 mmHg — AB (ref 32.0–48.0)
PCO2 ART: 47.8 mmHg (ref 32.0–48.0)
PCO2 ART: 36.5 mmHg (ref 32.0–48.0)
PH ART: 7.415 (ref 7.350–7.450)
PH ART: 7.402 (ref 7.350–7.450)
PH ART: 7.373 (ref 7.350–7.450)
PO2 ART: 76 mmHg — AB (ref 83.0–108.0)
TCO2: 27 mmol/L (ref 0–100)
TCO2: 25 mmol/L (ref 0–100)
TCO2: 25 mmol/L (ref 0–100)
TCO2: 25 mmol/L (ref 0–100)
TCO2: 23 mmol/L (ref 0–100)
TCO2: 24 mmol/L (ref 0–100)
TCO2: 25 mmol/L (ref 0–100)
pCO2 arterial: 55.8 mmHg — ABNORMAL HIGH (ref 32.0–48.0)
pCO2 arterial: 34.9 mmHg (ref 32.0–48.0)
pCO2 arterial: 36.3 mmHg (ref 32.0–48.0)
pCO2 arterial: 40.1 mmHg (ref 32.0–48.0)
pH, Arterial: 7.313 — ABNORMAL LOW (ref 7.350–7.450)
pH, Arterial: 7.234 — ABNORMAL LOW (ref 7.350–7.450)
pH, Arterial: 7.299 — ABNORMAL LOW (ref 7.350–7.450)
pH, Arterial: 7.4 (ref 7.350–7.450)
pO2, Arterial: 68 mmHg — ABNORMAL LOW (ref 83.0–108.0)
pO2, Arterial: 71 mmHg — ABNORMAL LOW (ref 83.0–108.0)
pO2, Arterial: 86 mmHg (ref 83.0–108.0)
pO2, Arterial: 165 mmHg — ABNORMAL HIGH (ref 83.0–108.0)
pO2, Arterial: 61 mmHg — ABNORMAL LOW (ref 83.0–108.0)
pO2, Arterial: 67 mmHg — ABNORMAL LOW (ref 83.0–108.0)

## 2016-07-12 LAB — CBC
HCT: 29.4 % — ABNORMAL LOW (ref 39.0–52.0)
HEMATOCRIT: 30.7 % — AB (ref 39.0–52.0)
HEMOGLOBIN: 10.7 g/dL — AB (ref 13.0–17.0)
Hemoglobin: 10.3 g/dL — ABNORMAL LOW (ref 13.0–17.0)
MCH: 28.3 pg (ref 26.0–34.0)
MCH: 28.5 pg (ref 26.0–34.0)
MCHC: 34.9 g/dL (ref 30.0–36.0)
MCHC: 35 g/dL (ref 30.0–36.0)
MCV: 81.2 fL (ref 78.0–100.0)
MCV: 81.4 fL (ref 78.0–100.0)
PLATELETS: 104 10*3/uL — AB (ref 150–400)
PLATELETS: 99 10*3/uL — AB (ref 150–400)
RBC: 3.78 MIL/uL — AB (ref 4.22–5.81)
RBC: 3.61 MIL/uL — AB (ref 4.22–5.81)
RDW: 14.1 % (ref 11.5–15.5)
RDW: 14.5 % (ref 11.5–15.5)
WBC: 9.5 10*3/uL (ref 4.0–10.5)
WBC: 13.9 10*3/uL — ABNORMAL HIGH (ref 4.0–10.5)

## 2016-07-12 LAB — PREPARE CRYOPRECIPITATE
UNIT DIVISION: 0
Unit division: 0

## 2016-07-12 LAB — GLUCOSE, CAPILLARY
GLUCOSE-CAPILLARY: 121 mg/dL — AB (ref 65–99)
GLUCOSE-CAPILLARY: 120 mg/dL — AB (ref 65–99)
GLUCOSE-CAPILLARY: 127 mg/dL — AB (ref 65–99)
GLUCOSE-CAPILLARY: 121 mg/dL — AB (ref 65–99)
GLUCOSE-CAPILLARY: 117 mg/dL — AB (ref 65–99)
GLUCOSE-CAPILLARY: 116 mg/dL — AB (ref 65–99)
GLUCOSE-CAPILLARY: 114 mg/dL — AB (ref 65–99)
GLUCOSE-CAPILLARY: 118 mg/dL — AB (ref 65–99)
Glucose-Capillary: 101 mg/dL — ABNORMAL HIGH (ref 65–99)
Glucose-Capillary: 101 mg/dL — ABNORMAL HIGH (ref 65–99)
Glucose-Capillary: 104 mg/dL — ABNORMAL HIGH (ref 65–99)
Glucose-Capillary: 107 mg/dL — ABNORMAL HIGH (ref 65–99)
Glucose-Capillary: 123 mg/dL — ABNORMAL HIGH (ref 65–99)
Glucose-Capillary: 133 mg/dL — ABNORMAL HIGH (ref 65–99)
Glucose-Capillary: 93 mg/dL (ref 65–99)
Glucose-Capillary: 96 mg/dL (ref 65–99)
Glucose-Capillary: 98 mg/dL (ref 65–99)

## 2016-07-12 LAB — PREPARE PLATELET PHERESIS
UNIT DIVISION: 0
UNIT DIVISION: 0
UNIT DIVISION: 0
Unit division: 0
Unit division: 0

## 2016-07-12 LAB — POCT I-STAT 4, (NA,K, GLUC, HGB,HCT)
Glucose, Bld: 179 mg/dL — ABNORMAL HIGH (ref 65–99)
HCT: 20 % — ABNORMAL LOW (ref 39.0–52.0)
HEMOGLOBIN: 6.8 g/dL — AB (ref 13.0–17.0)
Potassium: 3.9 mmol/L (ref 3.5–5.1)
Sodium: 143 mmol/L (ref 135–145)

## 2016-07-12 LAB — CREATININE, SERUM
Creatinine, Ser: 1.16 mg/dL (ref 0.61–1.24)
GFR calc Af Amer: >60 mL/min (ref >60)
GFR calc non Af Amer: >60 mL/min (ref >60)

## 2016-07-12 LAB — BLOOD GAS, ARTERIAL
Acid-Base Excess: 3.5 mmol/L — ABNORMAL HIGH (ref 0.0–2.0)
Bicarbonate: 27.5 mmol/L (ref 20.0–28.0)
DRAWN BY: 206361
FIO2: 0.21
O2 Saturation: 96.3 %
PATIENT TEMPERATURE: 98.6
pCO2 arterial: 42 mmHg (ref 32.0–48.0)
pH, Arterial: 7.432 (ref 7.350–7.450)
pO2, Arterial: 84.1 mmHg (ref 83.0–108.0)

## 2016-07-12 LAB — MAGNESIUM
Magnesium: 2.5 mg/dL — ABNORMAL HIGH (ref 1.7–2.4)
Magnesium: 2.2 mg/dL (ref 1.7–2.4)

## 2016-07-12 LAB — CARBOXYHEMOGLOBIN
Carboxyhemoglobin: 1.5 % (ref 0.5–1.5)
Methemoglobin: 1 % (ref 0.0–1.5)
O2 SAT: 59.5 %
TOTAL HEMOGLOBIN: 10.5 g/dL — AB (ref 12.0–16.0)

## 2016-07-12 MED ORDER — ROSUVASTATIN CALCIUM 10 MG PO TABS: 10.0000 mg | ORAL_TABLET | Freq: Every evening | ORAL | Status: DC

## 2016-07-12 MED ORDER — WARFARIN - PHYSICIAN DOSING INPATIENT
Freq: Every day | Status: DC
  Administered 2016-07-19 – 2016-07-29 (×9)

## 2016-07-12 MED ORDER — LACTATED RINGERS IV SOLN
INTRAVENOUS | Status: DC
  Administered 2016-07-12: 22:00:00 via INTRAVENOUS

## 2016-07-12 MED ORDER — METOCLOPRAMIDE HCL 5 MG/ML IJ SOLN
10.0000 mg | Freq: Four times a day (QID) | INTRAMUSCULAR | Status: AC
  Administered 2016-07-12 – 2016-07-14 (×8): 10 mg via INTRAVENOUS
  Filled 2016-07-12 (×8): qty 2

## 2016-07-12 MED ORDER — INSULIN ASPART 100 UNIT/ML ~~LOC~~ SOLN
0.0000 [IU] | SUBCUTANEOUS | Status: DC
  Administered 2016-07-15 – 2016-07-17 (×5): 2 [IU] via SUBCUTANEOUS
  Administered 2016-07-17: 4 [IU] via SUBCUTANEOUS
  Administered 2016-07-17: 2 [IU] via SUBCUTANEOUS
  Administered 2016-07-17: 4 [IU] via SUBCUTANEOUS

## 2016-07-12 MED ORDER — INFLUENZA VAC SPLIT QUAD 0.5 ML IM SUSY
0.5000 mL | PREFILLED_SYRINGE | INTRAMUSCULAR | Status: DC | PRN
  Filled 2016-07-12: qty 0.5

## 2016-07-12 MED ORDER — WARFARIN SODIUM 2.5 MG PO TABS: 2.5000 mg | ORAL_TABLET | Freq: Every day | ORAL | Status: DC

## 2016-07-12 MED ORDER — POTASSIUM CHLORIDE 10 MEQ/50ML IV SOLN
10.0000 meq | INTRAVENOUS | Status: AC
  Administered 2016-07-12 (×3): 10 meq via INTRAVENOUS
  Filled 2016-07-12 (×2): qty 50

## 2016-07-12 MED ORDER — INSULIN DETEMIR 100 UNIT/ML ~~LOC~~ SOLN
20.0000 [IU] | Freq: Two times a day (BID) | SUBCUTANEOUS | Status: DC
  Administered 2016-07-12 – 2016-07-13 (×3): 20 [IU] via SUBCUTANEOUS
  Filled 2016-07-12 (×5): qty 0.2

## 2016-07-12 MED ORDER — FENTANYL CITRATE (PF) 100 MCG/2ML IJ SOLN
50.0000 ug | INTRAMUSCULAR | Status: DC | PRN
  Administered 2016-07-12 – 2016-07-13 (×3): 50 ug via INTRAVENOUS
  Administered 2016-07-13: 25 ug via INTRAVENOUS
  Administered 2016-07-13 (×4): 50 ug via INTRAVENOUS
  Administered 2016-07-13: 25 ug via INTRAVENOUS
  Administered 2016-07-14 – 2016-07-27 (×27): 50 ug via INTRAVENOUS
  Filled 2016-07-12 (×37): qty 2

## 2016-07-12 MED ORDER — FUROSEMIDE 10 MG/ML IJ SOLN
10.0000 mg/h | INTRAVENOUS | Status: DC
  Administered 2016-07-12: 4 mg/h via INTRAVENOUS
  Administered 2016-07-13 – 2016-07-14 (×2): 8 mg/h via INTRAVENOUS
  Filled 2016-07-12 (×5): qty 25

## 2016-07-12 NOTE — Progress Notes (Signed)
      [address]       [place] [address]             [phone]      Resting in bed  BP (!) 112/59   Pulse 80   Temp 99.4 F (37.4 C) (Oral)   Resp (!) 29   Ht 6' (1.829 m)   Wt 214 lb 8.1 oz (97.3 kg)   SpO2 94%   BMI 29.09 kg/m    Intake/Output Summary (Last 24 hours) at 07/12/16 1811 Last data filed at 07/12/16 1800  Gross per 24 hour  Intake          6906.07 ml  Output             3294 ml  Net          3612.07 ml    Hct= 28 Creatinine 1.10  Doing well POD # 1  Kaelynn Igo C. Roxan Hockey, MD [facility] [phone]

## 2016-07-12 NOTE — Progress Notes (Signed)
EKG CRITICAL VALUE     12 lead EKG performed.  Critical value noted. Phillips Grout, RN notified.   Zianna Dercole, CCT 07/12/2016 8:03 AM

## 2016-07-12 NOTE — Progress Notes (Addendum)
[address]       [place] [address]             [phone]        CARDIOTHORACIC SURGERY PROGRESS NOTE   R1 Day Post-Op  S/P Procedure(s) (LRB): REDO CORONARY ARTERY BYPASS GRAFTING (CABG) x two using left leg greater saphenous vein harvested endoscopically-SVG to PDA -SVG to RAMUS INTERMEDIATE (N/A) TRANSESOPHAGEAL ECHOCARDIOGRAM (TEE) (N/A) MITRAL VALVE (MV) REPLACEMENT (N/A)    R1 Day Post-Op Procedure(s) (LRB): Re-exploration (CABG) for post op bleeding, (N/A)  Subjective: Just extubated.  Looks good.  Denies pain or SOB.  Neuro grossly intact.  Objective: Vital signs: BP Readings from Last 1 Encounters:  07/12/16 (!) 93/51   Pulse Readings from Last 1 Encounters:  07/12/16 81   Resp Readings from Last 1 Encounters:  07/12/16 (!) 21   Temp Readings from Last 1 Encounters:  07/12/16 99 F (37.2 C)    Hemodynamics: PAP: (30-50)/(14-28) 46/28 CO:  [3.9 L/min-4.9 L/min] 4.1 L/min CI:  [1.9 L/min/m2-2.4 L/min/m2] 2 L/min/m2  Physical Exam:  Rhythm:   Sinus w/ 3rd degree AV block  Breath sounds: Coarse breath sounds  Heart sounds:  RRR  Incisions:  Dressings dry, intact  Abdomen:  Soft, non-distended, non-tender  Extremities:  Warm, well-perfused  Chest tubes:  Decreasing but significant volume thin serosanguinous output, no air leak    Intake/Output from previous day: 10/10 0701 - 10/11 0700 In: 15601.1 [I.V.:7106.1; QG:6163286; IV [id number] Out: [id number] [Urine:2259; Blood:3500; Chest Tube:1740] Intake/Output this shift: Total I/O In: 287.7 [I.V.:287.7] Out: 175 [Urine:155; Chest Tube:20]  Lab Results:  CBC: Recent Labs  07/11/16 2000 07/11/16 2006 07/12/16 0500  WBC 9.9  --  9.5  HGB 12.2* 11.9* 10.7*  HCT 35.8* 35.0* 30.7*  PLT 87*  --  104*    BMET:  Recent Labs  07/11/16 1857 07/11/16 2006 07/12/16 0500  NA 143 144 139  K 4.4 4.0 3.5  CL 103  --  109  CO2  --   --  23  GLUCOSE 173* 147* 130*  BUN 17   --  17  CREATININE 0.80  --  1.01  CALCIUM  --   --  7.7*     PT/INR:   Recent Labs  07/11/16 2000  LABPROT 20.4*  INR 1.72    CBG (last 3)   Recent Labs  07/12/16 0559 07/12/16 0651 07/12/16 0741  GLUCAP 117* 116* 114*    ABG    Component Value Date/Time   PHART 7.402 07/12/2016 0836   PCO2ART 34.9 07/12/2016 0836   PO2ART 76.0 (L) 07/12/2016 0836   HCO3 21.7 07/12/2016 0836   TCO2 23 07/12/2016 0836   ACIDBASEDEF 3.0 (H) 07/12/2016 0836   O2SAT 95.0 07/12/2016 0836    CXR: PORTABLE CHEST 1 VIEW  COMPARISON:  July 11, 2016  FINDINGS: Endotracheal tube tip is 5.4 cm above the carina. Swan-Ganz catheter tip is in the right main pulmonary outflow tract. There are bilateral chest tubes with a mediastinal drain. Nasogastric tube tip and side port are below the diaphragm. No pneumothorax. There is mild bibasilar atelectasis. Lungs elsewhere are clear. There is cardiomegaly. The pulmonary vascularity is normal. Patient is status post mitral valve replacement. Atherosclerotic calcification is noted in the aorta.  IMPRESSION: Tube and catheter positions as described without pneumothorax. Bibasilar atelectasis. No edema or consolidation. Stable cardiomegaly. Aortic atherosclerosis noted.   Electronically Signed   By: Lowella Grip III M.D.  On: 07/12/2016 09:11  Assessment/Plan:  Overall looks good POD1 Maintaining AV paced rhythm w/ stable hemodynamics on milrinone and low dose levophed and dopamine Cardiac output good and PA pressures relatively low Sinus rhythm w/ CHB under pacer Breathing comfortably w/ O2 sats 96% on 4 L/min via [place] Acute on chronic combined systolic and diastolic CHF w/ expected post op volume excess Expected post op acute blood loss anemia, mild, Hgb 10.7 this morning Expected post op atelectasis, mild Post op thrombocytopenia, platelet count stable 104k this morning Type II diabetes mellitus, excellent glycemic control  on insulin drip Post op coagulopathy - resolved Chest tubes draining serosanguinous fluid, no air leak   Mobilize, pulmonary toilet  Hold beta blockers and continue AV pacing  Wean levophed as tolerated  Wean dopamine slowly once off levophed  Wean milrinone very slowly  Leave chest tubes in for now  Add levemir insulin and wean drip  Start diuretics slowly later today if BP stable off levophed  Start coumadin slowly  Christian Alberts, MD 07/12/2016 9:24 AM

## 2016-07-12 NOTE — Care Management Note (Signed)
Case Management Note  Patient Details  Name: Christian Bowman MRN: [id number] Date of Birth: Jan 22, 1946  Subjective/Objective:    S/p CABG and MVR                Action/Plan:  PTA independent from home with wife - wife at bedside.  Wife states she will be with pt 24/7 post discharge.  CM will continue to follow for discharge needs   Expected Discharge Date:                  Expected Discharge Plan:  Home/Self Care  In-House Referral:     Discharge planning Services  CM Consult  Post Acute Care Choice:    Choice offered to:     DME Arranged:    DME Agency:     HH Arranged:    HH Agency:     Status of Service:  In process, will continue to follow  If discussed at Long Length of Stay Meetings, dates discussed:    Additional Comments:  Maryclare Labrador, RN 07/12/2016, 11:12 AM

## 2016-07-12 NOTE — Procedures (Signed)
Extubation Procedure Note  Patient Details:   Name: Christian Bowman DOB: 08-Oct-1945 MRN: [id number]   Airway Documentation:     Evaluation  O2 sats: stable throughout Complications: No apparent complications Patient did tolerate procedure well. Bilateral Breath Sounds: Clear, Diminished   Yes   Positive cuff leak, NIF - 20, VC 1.24.  Pt placed on nasal cannula 4 L with humidity. No stridor noted.  Incentive spirometer pt able to reach 750.  [name] 07/12/2016, 12:16 PM

## 2016-07-13 ENCOUNTER — Inpatient Hospital Stay (HOSPITAL_COMMUNITY): Payer: Medicare Other

## 2016-07-13 LAB — TYPE AND SCREEN
ABO/RH(D): A POS
ANTIBODY SCREEN: NEGATIVE
UNIT DIVISION: 0
UNIT DIVISION: 0
UNIT DIVISION: 0
UNIT DIVISION: 0
UNIT DIVISION: 0
UNIT DIVISION: 0
UNIT DIVISION: 0
Unit division: 0
Unit division: 0
Unit division: 0
Unit division: 0
Unit division: 0

## 2016-07-13 LAB — GLUCOSE, CAPILLARY
GLUCOSE-CAPILLARY: 96 mg/dL (ref 65–99)
GLUCOSE-CAPILLARY: 85 mg/dL (ref 65–99)
GLUCOSE-CAPILLARY: 79 mg/dL (ref 65–99)
Glucose-Capillary: 75 mg/dL (ref 65–99)

## 2016-07-13 LAB — BASIC METABOLIC PANEL
ANION GAP: 6 (ref 5–15)
BUN: 18 mg/dL (ref 6–20)
CALCIUM: 8 mg/dL — AB (ref 8.9–10.3)
CO2: 25 mmol/L (ref 22–32)
Chloride: 108 mmol/L (ref 101–111)
Creatinine, Ser: 1.24 mg/dL (ref 0.61–1.24)
GFR calc Af Amer: >60 mL/min (ref >60)
GFR, EST NON AFRICAN AMERICAN: 58 mL/min — AB (ref >60)
GLUCOSE: 103 mg/dL — AB (ref 65–99)
Potassium: 3.9 mmol/L (ref 3.5–5.1)
SODIUM: 139 mmol/L (ref 135–145)

## 2016-07-13 LAB — CBC
HCT: 29.5 % — ABNORMAL LOW (ref 39.0–52.0)
Hemoglobin: 10.2 g/dL — ABNORMAL LOW (ref 13.0–17.0)
MCH: 28.7 pg (ref 26.0–34.0)
MCHC: 34.6 g/dL (ref 30.0–36.0)
MCV: 83.1 fL (ref 78.0–100.0)
Platelets: 100 10*3/uL — ABNORMAL LOW (ref 150–400)
RBC: 3.55 MIL/uL — ABNORMAL LOW (ref 4.22–5.81)
RDW: 14.9 % (ref 11.5–15.5)
WBC: 14.4 10*3/uL — AB (ref 4.0–10.5)

## 2016-07-13 LAB — PROTIME-INR
INR: 1.44
PROTHROMBIN TIME: 17.6 s — AB (ref 11.4–15.2)

## 2016-07-13 LAB — COOXEMETRY PANEL
CARBOXYHEMOGLOBIN: 1.6 % — AB (ref 0.5–1.5)
METHEMOGLOBIN: 1.1 % (ref 0.0–1.5)
O2 Saturation: 65.6 %
Total hemoglobin: 10.4 g/dL — ABNORMAL LOW (ref 12.0–16.0)

## 2016-07-13 MED ORDER — ALUM & MAG HYDROXIDE-SIMETH 200-200-20 MG/5ML PO SUSP
15.0000 mL | ORAL | Status: DC | PRN
Start: 2016-07-13 — End: 2016-07-14

## 2016-07-13 MED ORDER — ASPIRIN EC 81 MG PO TBEC: 81.0000 mg | DELAYED_RELEASE_TABLET | Freq: Every evening | ORAL | Status: DC

## 2016-07-13 MED ORDER — SODIUM CHLORIDE 0.9% FLUSH
3.0000 mL | Freq: Two times a day (BID) | INTRAVENOUS | Status: DC
  Administered 2016-07-14 – 2016-07-15 (×2): 3 mL via INTRAVENOUS

## 2016-07-13 MED ORDER — ENOXAPARIN SODIUM 30 MG/0.3ML ~~LOC~~ SOLN
30.0000 mg | SUBCUTANEOUS | Status: DC
  Administered 2016-07-14: 30 mg via SUBCUTANEOUS
  Filled 2016-07-13: qty 0.3

## 2016-07-13 MED ORDER — SODIUM CHLORIDE 0.9% FLUSH
10.0000 mL | Freq: Two times a day (BID) | INTRAVENOUS | Status: DC
  Administered 2016-07-13 – 2016-07-14 (×2): 20 mL
  Administered 2016-07-16 – 2016-07-17 (×2): 10 mL

## 2016-07-13 MED ORDER — SODIUM CHLORIDE 0.9 % IV SOLN
250.0000 mL | INTRAVENOUS | Status: DC | PRN
  Administered 2016-07-13: 250 mL via INTRAVENOUS

## 2016-07-13 MED ORDER — ORAL CARE MOUTH RINSE
15.0000 mL | Freq: Two times a day (BID) | OROMUCOSAL | Status: DC
  Administered 2016-07-13 – 2016-07-17 (×6): 15 mL via OROMUCOSAL

## 2016-07-13 MED ORDER — SODIUM CHLORIDE 0.9% FLUSH: 3.0000 mL | INTRAVENOUS | Status: DC | PRN

## 2016-07-13 MED ORDER — SODIUM CHLORIDE 0.9% FLUSH: 10.0000 mL | INTRAVENOUS | Status: DC | PRN

## 2016-07-13 MED ORDER — POTASSIUM CHLORIDE 10 MEQ/50ML IV SOLN
10.0000 meq | INTRAVENOUS | Status: AC
  Administered 2016-07-13 (×3): 10 meq via INTRAVENOUS
  Filled 2016-07-13: qty 50

## 2016-07-13 MED ORDER — WARFARIN SODIUM 5 MG IV SOLR: 2.5000 mg | Freq: Once | INTRAVENOUS | Status: DC

## 2016-07-13 MED ORDER — MOVING RIGHT ALONG BOOK
Freq: Once | Status: AC
  Administered 2016-07-13: 09:00:00
  Filled 2016-07-13: qty 1

## 2016-07-13 MED ORDER — CHLORHEXIDINE GLUCONATE 0.12 % MT SOLN
15.0000 mL | Freq: Two times a day (BID) | OROMUCOSAL | Status: DC
  Administered 2016-07-13 – 2016-07-15 (×5): 15 mL via OROMUCOSAL
  Filled 2016-07-13: qty 15

## 2016-07-13 MED FILL — Sodium Chloride IV Soln 0.9%: INTRAVENOUS | Qty: 3000 | Status: AC

## 2016-07-13 MED FILL — Heparin Sodium (Porcine) Inj 1000 Unit/ML: INTRAMUSCULAR | Qty: 20 | Status: AC

## 2016-07-13 MED FILL — Sodium Bicarbonate IV Soln 8.4%: INTRAVENOUS | Qty: 50 | Status: AC

## 2016-07-13 MED FILL — Electrolyte-R (PH 7.4) Solution: INTRAVENOUS | Qty: 7000 | Status: AC

## 2016-07-13 MED FILL — Heparin Sodium (Porcine) Inj 1000 Unit/ML: INTRAMUSCULAR | Qty: 30 | Status: AC

## 2016-07-13 MED FILL — Lidocaine HCl IV Inj 20 MG/ML: INTRAVENOUS | Qty: 5 | Status: AC

## 2016-07-13 MED FILL — Mannitol IV Soln 20%: INTRAVENOUS | Qty: 500 | Status: AC

## 2016-07-13 MED FILL — Albumin, Human Inj 5%: INTRAVENOUS | Qty: 250 | Status: AC

## 2016-07-13 MED FILL — Calcium Chloride Inj 10%: INTRAVENOUS | Qty: 10 | Status: AC

## 2016-07-13 NOTE — Progress Notes (Signed)
Peripherally Inserted Central Catheter/Midline Placement  The IV Nurse has discussed with the patient and/or persons authorized to consent for the patient, the purpose of this procedure and the potential benefits and risks involved with this procedure.  The benefits include less needle sticks, lab draws from the catheter, and the patient may be discharged home with the catheter. Risks include, but not limited to, infection, bleeding, blood clot (thrombus formation), and puncture of an artery; nerve damage and irregular heartbeat and possibility to perform a PICC exchange if needed/ordered by physician.  Alternatives to this procedure were also discussed.  Bard Power PICC patient education guide, fact sheet on infection prevention and patient information card has been provided to patient /or left at bedside.    PICC/Midline Placement Documentation        Synthia Innocent 07/13/2016, 6:06 PM

## 2016-07-13 NOTE — Progress Notes (Signed)
Anesthesiology Follow-up:  70 year old male two days S/P redo CABG X 2 and MVR complicated by post-op bleeding requiring re-exploration.  Awake and alert sitting in chair. Neuro intact. Hemodynamically stable on milrinone 0.3 mcg/kg/min, dopamine 3 mcg./kg/min. Norepinephrine weaned off. Dual chamber paced at 80 bpm.  VS: T- 36.9 BP- 130/68 HR-80 RR- 17 O2 sat 95% on 3L  K- 3.9 glucose- 126 BUN/Cr.- 18/1.24 H/H- 10.2/29.5 Platelets- 100,000  Extubated at 08:30 on POD #1.   Doing well overall.  Roberts Gaudy

## 2016-07-13 NOTE — Anesthesia Postprocedure Evaluation (Signed)
Anesthesia Post Note  Patient: Christian Bowman  Procedure(s) Performed: Procedure(s) (LRB): Re-exploration (CABG) for post op bleeding, (N/A)  Patient location during evaluation: ICU Anesthesia Type: General Level of consciousness: sedated and patient remains intubated per anesthesia plan Pain management: pain level controlled Vital Signs Assessment: post-procedure vital signs reviewed and stable Respiratory status: patient on ventilator - see flowsheet for VS and patient remains intubated per anesthesia plan Cardiovascular status: stable Anesthetic complications: no    Last Vitals:  Vitals:   07/13/16 0600 07/13/16 0700  BP: 138/68 123/65  Pulse: 87 80  Resp: (!) 26 19  Temp:      Last Pain:  Vitals:   07/13/16 0500  TempSrc:   PainSc: [employer]

## 2016-07-13 NOTE — Evaluation (Signed)
Clinical/Bedside Swallow Evaluation Patient Details  Name: OZZY HEASTER MRN: [id number] Date of Birth: November 13, 1945  Today's Date: 07/13/2016 Time: SLP Start Time (ACUTE ONLY): 1004 SLP Stop Time (ACUTE ONLY): 1019 SLP Time Calculation (min) (ACUTE ONLY): 15 min  Past Medical History:  Past Medical History:  Diagnosis Date  . Anginal pain ([place])   . CAD (coronary artery disease)   . Coronary artery disease involving coronary bypass graft   . Cyst of neck    right side  . DM2 (diabetes mellitus, type 2) ([place]) 08/26/2013  . Dyspnea   . Heart attack   . HTN (hypertension) 08/26/2013  . Hyperlipidemia 08/26/2013  . Left main coronary artery disease   . Left renal artery stenosis ([place])    Genesis 6x12 stent 2007  . Obesity (BMI 30.0-34.9) 08/26/2013  . Postoperative atrial fibrillation ([place]) 10/15/2005  . S/P CABG x 4 10/13/2005   LIMA to LAD, SVG to intermediate branch, sequential SVG to PDA and RPL branch, EVH via right thigh  . S/P mitral valve replacement with bioprosthetic valve 07/11/2016   31 mm [facility] Mitral bovine bioprosthetic tissue valve  . S/P redo CABG x 2 07/11/2016   SVG to PDA and SVG to Intermediate Branch, EVH via left thigh   Past Surgical History:  Past Surgical History:  Procedure Laterality Date  . CARDIAC CATHETERIZATION N/A 06/21/2016   Procedure: Right/Left Heart Cath and Coronary/Graft Angiography;  Surgeon: Sherren Mocha, MD;  Location: [place] [facility];  Service: Cardiovascular;  Laterality: N/A;  . CORONARY ARTERY BYPASS GRAFT  10/13/2005   LIMA to LAD, SVG to intermediate branch, sequential SVG to PDA and RPL  . CORONARY ARTERY BYPASS GRAFT N/A 07/11/2016   Procedure: REDO CORONARY ARTERY BYPASS GRAFTING (CABG) x two using left leg greater saphenous vein harvested endoscopically-SVG to PDA -SVG to RAMUS INTERMEDIATE;  Surgeon: Rexene Alberts, MD;  Location: [place];  Service: Open Heart Surgery;  Laterality: N/A;  . CORONARY ARTERY BYPASS GRAFT  N/A 07/11/2016   Procedure: Re-exploration (CABG) for post op bleeding,;  Surgeon: Rexene Alberts, MD;  Location: [place];  Service: Open Heart Surgery;  Laterality: N/A;  . MITRAL VALVE REPLACEMENT N/A 07/11/2016   Procedure: MITRAL VALVE (MV) REPLACEMENT;  Surgeon: Rexene Alberts, MD;  Location: [place];  Service: Open Heart Surgery;  Laterality: N/A;  . MYOCARDICAL PERFUSION  10/09/2007   NORMAL PERFUSION IN ALL REGIONS;NO EVIDENCE OF INDUCIBLE ISCHEMIA;POST STRESS EF% 66  . PERCUTANEOUS CORONARY STENT INTERVENTION (PCI-S)     DES in SVG to right coronary artery system  . RENAL ARTERY STENT Right 2007  . RENAL DOPPLER  03/28/2010   RIGHT RA-NORMAL;LEFT PROXIMAL RA AT STENT-PATENT WITH NO EVIDENCE OF SIGN DIAMETER REDUCTION. R & L KIDNEYS: EQUAL IN SIZE,SYMMETRICAL IN SHAPE.  . TEE WITHOUT CARDIOVERSION N/A 06/15/2016   Procedure: TRANSESOPHAGEAL ECHOCARDIOGRAM (TEE);  Surgeon: Sanda Klein, MD;  Location: [facility]OS[facility];  Service: Cardiovascular;  Laterality: N/A;  . TEE WITHOUT CARDIOVERSION N/A 07/11/2016   Procedure: TRANSESOPHAGEAL ECHOCARDIOGRAM (TEE);  Surgeon: Rexene Alberts, MD;  Location: [place];  Service: Open Heart Surgery;  Laterality: N/A;  . TRANSESOPHAGEAL ECHOCARDIOGRAM  10/19/2005   NORMAL LV; MILD TO MODERATE AMOUNT OF SOFT ATHEROMATOUS PLAQUE OF THE THORACIC AORTA; THE LEFT ATRIUM IS MILDLY DILATED;LEFT ATRIAL APPENDAGE FUNCTION IS NORMAL;NO THROMBUS IDENTIFIED. SMALL PFO WITH RIGHT TO LEFT SHUNT   HPI:  Patient is a 70 year old male who underwent redo coronary artery bypass grafting 2 and mitral  valve replacement on 10/10.  Bleeding noted after surgery, returned for surgical reexploration. Extubated 10/11. Noted to have some coughing with liquids that evening.    Assessment / Plan / Recommendation Clinical Impression  Pt demonstrates immediate signs of aspiration with trials of small sips of thin liquids; coughing throat clearing. Pt otherwise demonstrates clear vocal quailty  and no signs of oral or pharyngeal weakness upon exam. He was noted to have no gag when suction was applied to the upper oropharynx. Will proceed with Ojective swallow assessment (FEES) to determine ability to initiate modified diet.     Aspiration Risk  Severe aspiration risk    Diet Recommendation NPO        Other  Recommendations Oral Care Recommendations: Oral care QID     Swallow Study   General HPI: Patient is a 70 year old male who underwent redo coronary artery bypass grafting 2 and mitral valve replacement on 10/10.  Bleeding noted after surgery, returned for surgical reexploration. Extubated 10/11. Noted to have some coughing with liquids that evening.  Type of Study: Bedside Swallow Evaluation Diet Prior to this Study: NPO Temperature Spikes Noted: No Respiratory Status: Room air History of Recent Intubation: No Behavior/Cognition: Alert;Cooperative;Pleasant mood Oral Cavity Assessment: Within Functional Limits Oral Care Completed by SLP: No Oral Cavity - Dentition: Adequate natural dentition Vision: Functional for self-feeding Self-Feeding Abilities: Able to feed self Patient Positioning: Upright in bed Baseline Vocal Quality: Normal;Low vocal intensity (clear) Volitional Cough: Congested Volitional Swallow: Able to elicit    Oral/Motor/Sensory Function Overall Oral Motor/Sensory Function: Within functional limits   Ice Chips Ice chips: Impaired Presentation: Spoon Pharyngeal Phase Impairments: Throat Clearing - Delayed (productive expectoration)   Thin Liquid Thin Liquid: Impaired Presentation: Cup Pharyngeal  Phase Impairments: Multiple swallows;Cough - Immediate;Throat Clearing - Immediate    Nectar Thick     Honey Thick     Puree Puree: Not tested   Solid   GO   Solid: Not tested       Herbie [place], MA CCC-SLP [id number]  Phinley Schall, Katherene Ponto 07/13/2016,11:43 AM

## 2016-07-13 NOTE — Progress Notes (Addendum)
Patient ID: Christian Bowman, male   DOB: [birth year], 70 y.o.   MRN: [id number]  SICU Evening Rounds:  Hemodynamically stable on milrinone 0.2.  Good urine output on lasix drip.  Did not pass FEES swallowing exam today so NPO.   Can not take coumadin or ASA tonight

## 2016-07-13 NOTE — Progress Notes (Addendum)
[address]       [place] [address]             [phone]        CARDIOTHORACIC SURGERY PROGRESS NOTE   R2 Days Post-OpS/P Procedure(s) (LRB): REDO CORONARY ARTERY BYPASS GRAFTING (CABG) x two using left leg greater saphenous vein harvested endoscopically-SVG to PDA -SVG to RAMUS INTERMEDIATE (N/A) TRANSESOPHAGEAL ECHOCARDIOGRAM (TEE) (N/A) MITRAL VALVE (MV) REPLACEMENT (N/A)   R2 Days Post-Op Procedure(s) (LRB): Re-exploration (CABG) for post op bleeding, (N/A)  Subjective: Looks good.  Feels tired.  Denies pain or SOB.  Nausea improved but no appetite.  Wants wife at bedside.  Remains anxious and depressed, which was present preoperatively.  Reportedly had some coughing with trial drinking liquids overnight  Objective: Vital signs: BP Readings from Last 1 Encounters:  07/13/16 123/65   Pulse Readings from Last 1 Encounters:  07/13/16 80   Resp Readings from Last 1 Encounters:  07/13/16 19   Temp Readings from Last 1 Encounters:  07/13/16 98.5 F (36.9 C) (Oral)    Hemodynamics: PAP: (34-50)/(19-31) 45/25 CO:  [4.1 L/min] 4.1 L/min CI:  [2 L/min/m2] 2 L/min/m2  Mixed venous co-ox 65.6%  Physical Exam:  Rhythm:   Sinus w/ CHB - DDD pacing  Breath sounds: clear  Heart sounds:  RRR  Incisions:  Dressing dry, intact  Abdomen:  Soft, non-distended, non-tender  Extremities:  Warm, well-perfused  Chest tubes:  Decreasing but significant volume thin serosanguinous output, no air leak    Intake/Output from previous day: 10/11 0701 - 10/12 0700 In: 1885.6 [P.O.:120; I.V.:1615.6; IV Piggyback:150] Out: 2680 [Urine:1410; Chest Tube:1270] Intake/Output this shift: No intake/output data recorded.  Lab Results:  CBC: Recent Labs  07/12/16 1500 07/12/16 1522 07/13/16 0406  WBC 13.9*  --  14.4*  HGB 10.3* 9.5* 10.2*  HCT 29.4* 28.0* 29.5*  PLT 99*  --  100*    BMET:  Recent Labs  07/12/16 0500  07/12/16 1522 07/13/16 0406    NA 139  --  141 139  K 3.5  --  3.9 3.9  CL 109  --  105 108  CO2 23  --   --  25  GLUCOSE 130*  --  101* 103*  BUN 17  --  18 18  CREATININE 1.01  < > 1.10 1.24  CALCIUM 7.7*  --   --  8.0*  < > = values in this interval not displayed.   PT/INR:   Recent Labs  07/13/16 0406  LABPROT 17.6*  INR 1.44    CBG (last 3)   Recent Labs  07/12/16 1920 07/12/16 2346 07/13/16 0353  GLUCAP 101* 101* 96    ABG    Component Value Date/Time   PHART 7.373 07/12/2016 1531   PCO2ART 40.1 07/12/2016 1531   PO2ART 67.0 (L) 07/12/2016 1531   HCO3 23.3 07/12/2016 1531   TCO2 25 07/12/2016 1531   ACIDBASEDEF 2.0 07/12/2016 1531   O2SAT 93.0 07/12/2016 1531    CXR: PORTABLE CHEST 1 VIEW  COMPARISON:  July 12, 2016  FINDINGS: Endotracheal tube and nasogastric tube have been removed. Cordis tip is in the superior vena cava ; Swan-Ganz catheter has been removed. Right subclavian catheter tip is in the superior vena cava with the tip just above the cavoatrial junction. There are chest tubes bilaterally and mediastinal drains, stable. No evident pneumothorax. There is airspace consolidation in the lung bases, stable. There is a minimal right pleural  effusion. Cardiomegaly is stable with pulmonary vascularity within normal limits. There is atherosclerotic calcification in the aorta. The patient has had coronary artery bypass grafting and mitral valve replacement.  IMPRESSION: Tube and catheter positions as described without pneumothorax. Bibasilar airspace consolidation. Minimal right pleural effusion. Stable cardiac silhouette. There is aortic atherosclerosis.   Electronically Signed   By: Lowella Grip III M.D.   On: 07/13/2016 08:02  Assessment/Plan:  Overall stable POD2 Remains in NSR w/ complete heart block, DDD pacing Stable hemodynamics off levophed on low dose milrinone and dopamine Breathing comfortably w/ O2 sats 96% on 2 L/min via [place] Acute on  chronic combined systolic and diastolic CHF w/ expected post op volume excess Expected post op acute blood loss anemia, mild, Hgb stable 10.2 this morning Expected post op atelectasis, mild Post op thrombocytopenia, platelet count stable 100k this morning Type II diabetes mellitus, excellent glycemic control off insulin drip Chest tubes draining serosanguinous fluid, no air leak   Mobilize, pulmonary toilet  Hold beta blockers and continue AV pacing  Wean dopamine slowly  Wean milrinone very slowly  D/C mediastinal tubes but leave remaining chest tubes in for now  Increase lasix drip  Start coumadin slowly  Rexene Alberts, MD 07/13/2016 8:00 AM

## 2016-07-13 NOTE — Procedures (Addendum)
Objective Swallowing Evaluation: Type of Study: FEES-Fiberoptic Endoscopic Evaluation of Swallow  Patient Details  Name: Christian Bowman MRN: [id number] Date of Birth: 11-19-1945  Today's Date: 07/13/2016 Time: SLP Start Time (ACUTE ONLY): 1345-SLP Stop Time (ACUTE ONLY): 1402 SLP Time Calculation (min) (ACUTE ONLY): 17 min  Past Medical History:  Past Medical History:  Diagnosis Date  . Anginal pain ([place])   . CAD (coronary artery disease)   . Coronary artery disease involving coronary bypass graft   . Cyst of neck    right side  . DM2 (diabetes mellitus, type 2) ([place]) 08/26/2013  . Dyspnea   . Heart attack   . HTN (hypertension) 08/26/2013  . Hyperlipidemia 08/26/2013  . Left main coronary artery disease   . Left renal artery stenosis ([place])    Genesis 6x12 stent 2007  . Obesity (BMI 30.0-34.9) 08/26/2013  . Postoperative atrial fibrillation ([place]) 10/15/2005  . S/P CABG x 4 10/13/2005   LIMA to LAD, SVG to intermediate branch, sequential SVG to PDA and RPL branch, EVH via right thigh  . S/P mitral valve replacement with bioprosthetic valve 07/11/2016   31 mm [facility] Mitral bovine bioprosthetic tissue valve  . S/P redo CABG x 2 07/11/2016   SVG to PDA and SVG to Intermediate Branch, EVH via left thigh   Past Surgical History:  Past Surgical History:  Procedure Laterality Date  . CARDIAC CATHETERIZATION N/A 06/21/2016   Procedure: Right/Left Heart Cath and Coronary/Graft Angiography;  Surgeon: Sherren Mocha, MD;  Location: [place];  Service: Cardiovascular;  Laterality: N/A;  . CORONARY ARTERY BYPASS GRAFT  10/13/2005   LIMA to LAD, SVG to intermediate branch, sequential SVG to PDA and RPL  . CORONARY ARTERY BYPASS GRAFT N/A 07/11/2016   Procedure: REDO CORONARY ARTERY BYPASS GRAFTING (CABG) x two using left leg greater saphenous vein harvested endoscopically-SVG to PDA -SVG to RAMUS INTERMEDIATE;  Surgeon: Rexene Alberts, MD;  Location: [facility];  Service: Open Heart  Surgery;  Laterality: N/A;  . CORONARY ARTERY BYPASS GRAFT N/A 07/11/2016   Procedure: Re-exploration (CABG) for post op bleeding,;  Surgeon: Rexene Alberts, MD;  Location: [facility];  Service: Open Heart Surgery;  Laterality: N/A;  . MITRAL VALVE REPLACEMENT N/A 07/11/2016   Procedure: MITRAL VALVE (MV) REPLACEMENT;  Surgeon: Rexene Alberts, MD;  Location: [facility];  Service: Open Heart Surgery;  Laterality: N/A;  . MYOCARDICAL PERFUSION  10/09/2007   NORMAL PERFUSION IN ALL REGIONS;NO EVIDENCE OF INDUCIBLE ISCHEMIA;POST STRESS EF% 66  . PERCUTANEOUS CORONARY STENT INTERVENTION (PCI-S)     DES in SVG to right coronary artery system  . RENAL ARTERY STENT Right 2007  . RENAL DOPPLER  03/28/2010   RIGHT RA-NORMAL;LEFT PROXIMAL RA AT STENT-PATENT WITH NO EVIDENCE OF SIGN DIAMETER REDUCTION. R & L KIDNEYS: EQUAL IN SIZE,SYMMETRICAL IN SHAPE.  . TEE WITHOUT CARDIOVERSION N/A 06/15/2016   Procedure: TRANSESOPHAGEAL ECHOCARDIOGRAM (TEE);  Surgeon: Sanda Klein, MD;  [facility];  Service: Cardiovascular;  Laterality: N/A;  . TEE WITHOUT CARDIOVERSION N/A 07/11/2016   Procedure: TRANSESOPHAGEAL ECHOCARDIOGRAM (TEE);  Surgeon: Rexene Alberts, MD;  Location: [place];  Service: Open Heart Surgery;  Laterality: N/A;  . TRANSESOPHAGEAL ECHOCARDIOGRAM  10/19/2005   NORMAL LV; MILD TO MODERATE AMOUNT OF SOFT ATHEROMATOUS PLAQUE OF THE THORACIC AORTA; THE LEFT ATRIUM IS MILDLY DILATED;LEFT ATRIAL APPENDAGE FUNCTION IS NORMAL;NO THROMBUS IDENTIFIED. SMALL PFO WITH RIGHT TO LEFT SHUNT   HPI: Patient is a 70 year old male who underwent redo  coronary artery bypass grafting 2 and mitral valve replacement on 10/10.  Bleeding noted after surgery, returned for surgical reexploration. Extubated 10/11. Noted to have some coughing with liquids that evening.   No Data Recorded   Assessment / Plan / Recommendation  CHL IP CLINICAL IMPRESSIONS 07/13/2016  Therapy Diagnosis Moderate pharyngeal phase dysphagia   Clinical Impression Pt presents with a moderate oropharyngeal dysphagia but high risk of aspiration due to generalized weakness impacting airway protection following cardiac surgery. At time of FEES pt is clearly fatigued from the days activities. Oropharyngeal impairments include decreased elevation and excursion of the hyolaryngeal complex with residuals, particularly above the CP that build over minimal trials. Pt is noted to have standing pharyngeal secretions that he cannot expectorate due to weak cough. Swallow is relatively timely with small controlled nectar thick sips, but with larger self fed sips timing is impaired and pt silently aspirates thickened liquids before and during the swallow. Recommend pt remain NPO except for ice chips after oral care until endurance ond general strength are visibly improved. Good prognosis for safe PO intake in the short term.   Impact on safety and function Severe aspiration risk      CHL IP TREATMENT RECOMMENDATION 07/13/2016  Treatment Recommendations Therapy as outlined in treatment plan below     Prognosis 07/13/2016  Prognosis for Safe Diet Advancement Good  Barriers to Reach Goals --  Barriers/Prognosis Comment --    CHL IP DIET RECOMMENDATION 07/13/2016  SLP Diet Recommendations NPO;Ice chips PRN after oral care  Liquid Administration via --  Medication Administration Via alternative means  Compensations --  Postural Changes --      CHL IP OTHER RECOMMENDATIONS 07/13/2016  Recommended Consults --  Oral Care Recommendations Oral care QID  Other Recommendations Have oral suction available      CHL IP FOLLOW UP RECOMMENDATIONS 07/13/2016  Follow up Recommendations Inpatient Rehab      CHL IP FREQUENCY AND DURATION 07/13/2016  Speech Therapy Frequency (ACUTE ONLY) min 2x/week  Treatment Duration 2 weeks           CHL IP ORAL PHASE 07/13/2016  Oral Phase Impaired  Oral - Pudding Teaspoon --  Oral - Pudding Cup --  Oral - Honey  Teaspoon --  Oral - Honey Cup --  Oral - Nectar Teaspoon WFL  Oral - Nectar Cup Premature spillage  Oral - Nectar Straw --  Oral - Thin Teaspoon --  Oral - Thin Cup --  Oral - Thin Straw --  Oral - Puree Delayed oral transit  Oral - Mech Soft --  Oral - Regular --  Oral - Multi-Consistency --  Oral - Pill --  Oral Phase - Comment --    CHL IP PHARYNGEAL PHASE 07/13/2016  Pharyngeal Phase Impaired  Pharyngeal- Pudding Teaspoon --  Pharyngeal --  Pharyngeal- Pudding Cup --  Pharyngeal --  Pharyngeal- Honey Teaspoon --  Pharyngeal --  Pharyngeal- Honey Cup --  Pharyngeal --  Pharyngeal- Nectar Teaspoon Reduced laryngeal elevation;Reduced anterior laryngeal mobility;Pharyngeal residue - valleculae;Pharyngeal residue - pyriform;Pharyngeal residue - cp segment;Lateral channel residue  Pharyngeal --  Pharyngeal- Nectar Cup Reduced laryngeal elevation;Reduced anterior laryngeal mobility;Pharyngeal residue - valleculae;Pharyngeal residue - pyriform;Pharyngeal residue - cp segment;Lateral channel residue  Pharyngeal --  Pharyngeal- Nectar Straw --  Pharyngeal --  Pharyngeal- Thin Teaspoon --  Pharyngeal --  Pharyngeal- Thin Cup --  Pharyngeal --  Pharyngeal- Thin Straw --  Pharyngeal --  Pharyngeal- Puree Reduced laryngeal elevation;Reduced anterior laryngeal mobility;Pharyngeal residue -  valleculae;Pharyngeal residue - pyriform;Pharyngeal residue - cp segment;Lateral channel residue  Pharyngeal --  Pharyngeal- Mechanical Soft --  Pharyngeal --  Pharyngeal- Regular --  Pharyngeal --  Pharyngeal- Multi-consistency --  Pharyngeal --  Pharyngeal- Pill --  Pharyngeal --  Pharyngeal Comment --     No flowsheet data found.  No flowsheet data found.  Herbie [place], [place] CCC-SLP [phone]  Lynann Beaver 07/13/2016, 2:33 PM

## 2016-07-13 NOTE — Progress Notes (Signed)
AM co-ox result is 65.6.  Results not crossing over in system per RT.

## 2016-07-13 NOTE — Anesthesia Postprocedure Evaluation (Signed)
Anesthesia Post Note  Patient: Christian Bowman  Procedure(s) Performed: Procedure(s) (LRB): REDO CORONARY ARTERY BYPASS GRAFTING (CABG) x two using left leg greater saphenous vein harvested endoscopically-SVG to PDA -SVG to RAMUS INTERMEDIATE (N/A) TRANSESOPHAGEAL ECHOCARDIOGRAM (TEE) (N/A) MITRAL VALVE (MV) REPLACEMENT (N/A)  Patient location during evaluation: SICU Anesthesia Type: General Level of consciousness: patient remains intubated per anesthesia plan Pain management: pain level controlled Vital Signs Assessment: vitals unstable Respiratory status: patient remains intubated per anesthesia plan and patient on ventilator - see flowsheet for VS Cardiovascular status: unstable Comments: Patient brought to SICU following surgery. Excessive bleeding noted in mediastinal tubes. Patient returned to OR per Dr. Roxy Manns for re-exploration.    Last Vitals:  Vitals:   07/13/16 0500 07/13/16 0600  BP: (!) 101/55 138/68  Pulse: 80 87  Resp: 17 (!) 26  Temp:      Last Pain:  Vitals:   07/13/16 0500  TempSrc:   PainSc: Asleep                 Durwood Dittus COKER

## 2016-07-14 ENCOUNTER — Inpatient Hospital Stay (HOSPITAL_COMMUNITY): Payer: Medicare Other

## 2016-07-14 LAB — COOXEMETRY PANEL
Carboxyhemoglobin: 1.8 % — ABNORMAL HIGH (ref 0.5–1.5)
METHEMOGLOBIN: 1.2 % (ref 0.0–1.5)
O2 Saturation: 63.6 %
TOTAL HEMOGLOBIN: 9.6 g/dL — AB (ref 12.0–16.0)

## 2016-07-14 LAB — CBC
HEMATOCRIT: 27.9 % — AB (ref 39.0–52.0)
Hemoglobin: 9.4 g/dL — ABNORMAL LOW (ref 13.0–17.0)
MCH: 28.7 pg (ref 26.0–34.0)
MCHC: 33.7 g/dL (ref 30.0–36.0)
MCV: 85.3 fL (ref 78.0–100.0)
Platelets: 83 10*3/uL — ABNORMAL LOW (ref 150–400)
RBC: 3.27 MIL/uL — ABNORMAL LOW (ref 4.22–5.81)
RDW: 15 % (ref 11.5–15.5)
WBC: 12.7 10*3/uL — ABNORMAL HIGH (ref 4.0–10.5)

## 2016-07-14 LAB — GLUCOSE, CAPILLARY
GLUCOSE-CAPILLARY: 101 mg/dL — AB (ref 65–99)
GLUCOSE-CAPILLARY: 101 mg/dL — AB (ref 65–99)
GLUCOSE-CAPILLARY: 49 mg/dL — AB (ref 65–99)
GLUCOSE-CAPILLARY: 67 mg/dL (ref 65–99)
GLUCOSE-CAPILLARY: 82 mg/dL (ref 65–99)
GLUCOSE-CAPILLARY: 96 mg/dL (ref 65–99)
Glucose-Capillary: 101 mg/dL — ABNORMAL HIGH (ref 65–99)
Glucose-Capillary: 107 mg/dL — ABNORMAL HIGH (ref 65–99)
Glucose-Capillary: 108 mg/dL — ABNORMAL HIGH (ref 65–99)
Glucose-Capillary: 58 mg/dL — ABNORMAL LOW (ref 65–99)
Glucose-Capillary: 64 mg/dL — ABNORMAL LOW (ref 65–99)
Glucose-Capillary: 69 mg/dL (ref 65–99)
Glucose-Capillary: 74 mg/dL (ref 65–99)
Glucose-Capillary: 88 mg/dL (ref 65–99)

## 2016-07-14 LAB — BASIC METABOLIC PANEL
ANION GAP: 9 (ref 5–15)
Anion gap: 5 (ref 5–15)
BUN: 19 mg/dL (ref 6–20)
BUN: 16 mg/dL (ref 6–20)
CALCIUM: 8 mg/dL — AB (ref 8.9–10.3)
CO2: 29 mmol/L (ref 22–32)
CO2: 28 mmol/L (ref 22–32)
CREATININE: 1.27 mg/dL — AB (ref 0.61–1.24)
Calcium: 8.3 mg/dL — ABNORMAL LOW (ref 8.9–10.3)
Chloride: 108 mmol/L (ref 101–111)
Chloride: 105 mmol/L (ref 101–111)
Creatinine, Ser: 1.28 mg/dL — ABNORMAL HIGH (ref 0.61–1.24)
GFR calc non Af Amer: 56 mL/min — ABNORMAL LOW (ref >60)
GFR, EST NON AFRICAN AMERICAN: 56 mL/min — AB (ref >60)
GLUCOSE: 124 mg/dL — AB (ref 65–99)
Glucose, Bld: 113 mg/dL — ABNORMAL HIGH (ref 65–99)
POTASSIUM: 3.4 mmol/L — AB (ref 3.5–5.1)
Potassium: 2.9 mmol/L — ABNORMAL LOW (ref 3.5–5.1)
SODIUM: 142 mmol/L (ref 135–145)
Sodium: 142 mmol/L (ref 135–145)

## 2016-07-14 LAB — PROTIME-INR
INR: 1.25
Prothrombin Time: 15.7 seconds — ABNORMAL HIGH (ref 11.4–15.2)

## 2016-07-14 LAB — CARBOXYHEMOGLOBIN - COOX: CARBOXYHEMOGLOBIN: 1.8 % — AB (ref 0.5–1.5)

## 2016-07-14 MED ORDER — AMIODARONE HCL IN DEXTROSE 360-4.14 MG/200ML-% IV SOLN
30.0000 mg/h | INTRAVENOUS | Status: DC
  Administered 2016-07-14 – 2016-07-15 (×2): 30 mg/h via INTRAVENOUS
  Filled 2016-07-14 (×2): qty 200

## 2016-07-14 MED ORDER — WARFARIN SODIUM 2.5 MG PO TABS
2.5000 mg | ORAL_TABLET | Freq: Every day | ORAL | Status: DC
  Filled 2016-07-14: qty 1

## 2016-07-14 MED ORDER — DEXTROSE 50 % IV SOLN
25.0000 mL | Freq: Once | INTRAVENOUS | Status: AC
  Administered 2016-07-14: 25 mL via INTRAVENOUS

## 2016-07-14 MED ORDER — AMIODARONE HCL IN DEXTROSE 360-4.14 MG/200ML-% IV SOLN
INTRAVENOUS | Status: AC
  Filled 2016-07-14: qty 400

## 2016-07-14 MED ORDER — DEXTROSE 50 % IV SOLN
INTRAVENOUS | Status: AC
  Filled 2016-07-14: qty 50

## 2016-07-14 MED ORDER — POTASSIUM CHLORIDE 10 MEQ/50ML IV SOLN
10.0000 meq | INTRAVENOUS | Status: AC
  Administered 2016-07-14 (×3): 10 meq via INTRAVENOUS

## 2016-07-14 MED ORDER — POTASSIUM CHLORIDE 10 MEQ/50ML IV SOLN
10.0000 meq | INTRAVENOUS | Status: AC
  Administered 2016-07-14 (×3): 10 meq via INTRAVENOUS
  Filled 2016-07-14 (×2): qty 50

## 2016-07-14 MED ORDER — LISINOPRIL 10 MG PO TABS: 10.0000 mg | ORAL_TABLET | Freq: Every day | ORAL | Status: DC

## 2016-07-14 MED ORDER — DEXTROSE 50 % IV SOLN
INTRAVENOUS | Status: AC
  Administered 2016-07-14: 50 mL
  Filled 2016-07-14: qty 50

## 2016-07-14 MED ORDER — AMIODARONE HCL IN DEXTROSE 360-4.14 MG/200ML-% IV SOLN
60.0000 mg/h | INTRAVENOUS | Status: AC
  Administered 2016-07-14 (×2): 60 mg/h via INTRAVENOUS
  Filled 2016-07-14: qty 200

## 2016-07-14 MED ORDER — ENOXAPARIN SODIUM 40 MG/0.4ML ~~LOC~~ SOLN
40.0000 mg | SUBCUTANEOUS | Status: DC
  Administered 2016-07-15 – 2016-07-16 (×2): 40 mg via SUBCUTANEOUS
  Filled 2016-07-14 (×2): qty 0.4

## 2016-07-14 MED ORDER — POTASSIUM CHLORIDE 10 MEQ/50ML IV SOLN
10.0000 meq | INTRAVENOUS | Status: AC
  Administered 2016-07-14 (×4): 10 meq via INTRAVENOUS

## 2016-07-14 MED ORDER — DEXTROSE 50 % IV SOLN: 50.0000 mL | Freq: Once | INTRAVENOUS | Status: DC

## 2016-07-14 MED ORDER — DEXTROSE 50 % IV SOLN
INTRAVENOUS | Status: AC
Start: 2016-07-14 — End: 2016-07-14
  Administered 2016-07-14 (×2): 25 mL
  Filled 2016-07-14: qty 50

## 2016-07-14 MED ORDER — AMIODARONE LOAD VIA INFUSION
150.0000 mg | Freq: Once | INTRAVENOUS | Status: AC
  Administered 2016-07-14: 150 mg via INTRAVENOUS

## 2016-07-14 MED ORDER — DEXTROSE-NACL 5-0.9 % IV SOLN
INTRAVENOUS | Status: DC
  Administered 2016-07-14: 05:00:00 via INTRAVENOUS

## 2016-07-14 MED ORDER — FAMOTIDINE IN NACL 20-0.9 MG/50ML-% IV SOLN
20.0000 mg | Freq: Two times a day (BID) | INTRAVENOUS | Status: DC
  Administered 2016-07-14 – 2016-07-16 (×4): 20 mg via INTRAVENOUS
  Filled 2016-07-14 (×4): qty 50

## 2016-07-14 MED ORDER — POTASSIUM CHLORIDE 10 MEQ/50ML IV SOLN
INTRAVENOUS | Status: AC
  Administered 2016-07-14: 10 meq via INTRAVENOUS
  Filled 2016-07-14: qty 150

## 2016-07-14 NOTE — Progress Notes (Signed)
  Amiodarone Drug - Drug Interaction Consult Note  Recommendations: Watch INR closely on warfarin  Amiodarone is metabolized by the cytochrome P450 system and therefore has the potential to cause many drug interactions. Amiodarone has an average plasma half-life of 50 days (range 20 to 100 days).   There is potential for drug interactions to occur several weeks or months after stopping treatment and the onset of drug interactions may be slow after initiating amiodarone.   []  Statins: Increased risk of myopathy. Simvastatin- restrict dose to 20mg  daily. Other statins: counsel patients to report any muscle pain or weakness immediately.  [x]  Anticoagulants: Amiodarone can increase anticoagulant effect. Consider warfarin dose reduction. Patients should be monitored closely and the dose of anticoagulant altered accordingly, remembering that amiodarone levels take several weeks to stabilize.  []  Antiepileptics: Amiodarone can increase plasma concentration of phenytoin, the dose should be reduced. Note that small changes in phenytoin dose can result in large changes in levels. Monitor patient and counsel on signs of toxicity.  []  Beta blockers: increased risk of bradycardia, AV block and myocardial depression. Sotalol - avoid concomitant use.  []   Calcium channel blockers (diltiazem and verapamil): increased risk of bradycardia, AV block and myocardial depression.  []   Cyclosporine: Amiodarone increases levels of cyclosporine. Reduced dose of cyclosporine is recommended.  []  Digoxin dose should be halved when amiodarone is started.  [x]  Diuretics: increased risk of cardiotoxicity if hypokalemia occurs.  []  Oral hypoglycemic agents (glyburide, glipizide, glimepiride): increased risk of hypoglycemia. Patient's glucose levels should be monitored closely when initiating amiodarone therapy.   []  Drugs that prolong the QT interval:  Torsades de pointes risk may be increased with concurrent use - avoid  if possible.  Monitor QTc, also keep magnesium/potassium WNL if concurrent therapy can't be avoided. [name] Antibiotics: e.g. fluoroquinolones, erythromycin. . Antiarrhythmics: e.g. quinidine, procainamide, disopyramide, sotalol. . Antipsychotics: e.g. phenothiazines, haloperidol.  . Lithium, tricyclic antidepressants, and methadone. Thank You,    Dangela How S. Alford Highland, PharmD, [facility] Clinical Staff Pharmacist Pager [phone]  Eilene Ghazi [facility]  07/14/2016 8:27 AM

## 2016-07-14 NOTE — Progress Notes (Signed)
Hypoglycemic Event  CBG: 58  Treatment: D50 IV 25 mL  Symptoms: Sweaty, otherwise asymptomatic.  Follow-up CBG: Time: [id number] CBG Result:101  Possible Reasons for Event: Medication regimen: Levemir 20 units BID; Pt strict NPO  Comments/MD notified: No    Sherlie Ban, RN

## 2016-07-14 NOTE — Progress Notes (Signed)
Speech Language Pathology Treatment: Dysphagia  Patient Details Name: Christian Bowman MRN: [id number] DOB: [birth year] Today's Date: 07/14/2016 Time: [id number] SLP Time Calculation (min) (ACUTE ONLY): 12 min  Assessment / Plan / Recommendation Clinical Impression  Pt is sitting upright in his chair and reports feeling subjectively stronger than previous date. Vocal quality is clear this morning, suggestive of improved secretion management. He continues to have immediate coughing s/p spoonfuls of water, but this is eliminated with ice chips and spoonfuls of nectar thick liquids. Per FEES report on previous date, dysphagia was felt to be in large part related to generalized fatigue and deconditioning that resulted in impaired timing and pharyngeal clearance. While we could repeat MBS today given clinical improvements this morning, I do worry about pt becoming more fatigued with decreased airway protection as the day goes on. Therefore, will plan to schedule MBS for the afternoon so that if he does fatigue, we will be able to capture that during the study. Will tentatively plan for this afternoon with radiology; pt and RN aware.   HPI HPI: Patient is a 70 year old male who underwent redo coronary artery bypass grafting 2 and mitral valve replacement on 10/10.  Bleeding noted after surgery, returned for surgical reexploration. Extubated 10/11. Noted to have some coughing with liquids that evening.       SLP Plan  MBS     Recommendations  Diet recommendations: NPO Medication Administration: Via alternative means                Oral Care Recommendations: Oral care QID Follow up Recommendations: Inpatient Rehab Plan: MBS       GO                [name] 07/14/2016, 9:35 AM  [name], M.A. CCC-SLP [phone]

## 2016-07-14 NOTE — Progress Notes (Signed)
Modified Barium Swallow Progress Note  Patient Details  Name: Christian Bowman MRN: [id number] Date of Birth: August 09, 1946  Today's Date: 07/14/2016  Modified Barium Swallow completed.  Full report located under Chart Review in the Imaging Section.  Brief recommendations include the following:  Clinical Impression  Pt continues to have a moderate pharyngeal dysphagia, although with suspected cervical esophageal and esophageal components contributing as well. Pt had one instance of silent aspiration due to premature spillage of large bolus of nectar thick liquids, but otherwise his airway protection during the swallow was good. Additional silent penetration did occur after the swallow on residue that remains primarily at his UES with smaller amounts in his valleculae.    While some residue may likely be attributed to reduced hyolaryngeal movement, pt also has esophageal backflow with all consistencies tested that flows back into the pharynx. Given his decreased sensation and weak cough, he is at a higher risk for developing an aspiration-related infection. Upon further scan of his esophagus he did appear to have barium remaining throughout his esophagus after minimal amounts of PO intake (MD not present to confirm).    Continue to suspect that pt needs additional time to rebuild his overall strength to facilitate pharyngeal function; however, MD may also wish to pursue possible esophageal component as well. For now, would allow ice chips after oral care.   Swallow Evaluation Recommendations   Recommended Consults: Consider esophageal assessment   SLP Diet Recommendations: NPO;Ice chips PRN after oral care    Medication Administration: Via alternative means   Oral Care Recommendations: Oral care QID   Other Recommendations: Have oral suction available    [name] 07/14/2016,3:16 PM  [name], M.A. CCC-SLP [phone]

## 2016-07-14 NOTE — Progress Notes (Addendum)
[address]       [place] [address]             [phone]        CARDIOTHORACIC SURGERY PROGRESS NOTE  R3 Days Post-OpS/P Procedure(s) (LRB): REDO CORONARY ARTERY BYPASS GRAFTING (CABG) x two using left leg greater saphenous vein harvested endoscopically-SVG to PDA -SVG to RAMUS INTERMEDIATE (N/A) TRANSESOPHAGEAL ECHOCARDIOGRAM (TEE) (N/A) MITRAL VALVE (MV) REPLACEMENT (N/A)   R3 Days Post-Op Procedure(s) (LRB): Re-exploration (CABG) for post op bleeding, (N/A)  Subjective: Feels pretty well.  Hungry.  Minimal soreness.  Denies SOB.  Tired and weak.  Objective: Vital signs: BP Readings from Last 1 Encounters:  07/14/16 (!) 153/73   Pulse Readings from Last 1 Encounters:  07/14/16 70   Resp Readings from Last 1 Encounters:  07/14/16 (!) 31   Temp Readings from Last 1 Encounters:  07/14/16 98.7 F (37.1 C) (Oral)    Hemodynamics:  mixed venous co-ox 63.6%  Physical Exam:  Rhythm:   Aflutter w/ HR 60's - attempted RAP into sinus unsuccessful  Breath sounds: clear  Heart sounds:  RRR  Incisions:  Dressings dry, intact  Abdomen:  Soft, non-distended, non-tender  Extremities:  Warm, well-perfused  Chest tubes:  decreasing volume thin serosanguinous output, no air leak    Intake/Output from previous day: 10/12 0701 - 10/13 0700 In: 807.7 [I.V.:607.7; IV Piggyback:200] Out: [id number] [Urine:2595; Chest Tube:780] Intake/Output this shift: No intake/output data recorded.  Lab Results:  CBC: Recent Labs  07/13/16 0406 07/14/16 0345  WBC 14.4* 12.7*  HGB 10.2* 9.4*  HCT 29.5* 27.9*  PLT 100* 83*    BMET:  Recent Labs  07/13/16 0406 07/14/16 0345  NA 139 142  K 3.9 2.9*  CL 108 108  CO2 25 29  GLUCOSE 103* 124*  BUN 18 19  CREATININE 1.24 1.27*  CALCIUM 8.0* 8.0*     PT/INR:   Recent Labs  07/14/16 0345  LABPROT 15.7*  INR 1.25    CBG (last 3)   Recent Labs  07/14/16 0345 07/14/16 0423 07/14/16 0503    GLUCAP 49* 64* 108*    ABG    Component Value Date/Time   PHART 7.373 07/12/2016 1531   PCO2ART 40.1 07/12/2016 1531   PO2ART 67.0 (L) 07/12/2016 1531   HCO3 23.3 07/12/2016 1531   TCO2 25 07/12/2016 1531   ACIDBASEDEF 2.0 07/12/2016 1531   O2SAT 63.6 07/14/2016 0350    CXR: Stable mild bibasilar atelectasis and pulm vasc congestion  Assessment/Plan:  Overall stable POD3 Now in Aflutter w/ controlled ventricular rate Stable BP and mixed venous co-ox on low dose milrinone  Breathing comfortably w/ O2 sats 99-100% on 2 L/min via [place] Acute on chronic combined systolic and diastolic CHF w/ expected post op volume excess Diuresing well on lasix drip - I/O's negative 2.5 liters yesterday but weight still 24 lbs above preop Expected post op acute blood loss anemia, mild, Hgb down slightly 9.4 this morning Expected post op atelectasis, mild Post op thrombocytopenia, platelet count down slightly 83k this morning Type II diabetes mellitus, hypoglycemic overnight Chest tubes draining serosanguinous fluid but decreasing, no air leak Failed FEES yesterday - no expected clinical reasons for swallowing dysfunction other than intubation for surgery - currently remains NPO - unclear source   Mobilize, pulmonary toilet  Start amiodarone and change pacer to VVI  Continue to wean milrinone slowly  Restart ACE-I at reduced dose  Leave chest tubes in until  output decreases further  Watch Hgb  Stop levemir insulin and continue SSI  Continue lasix drip  Coumadin and continue low dose lovenox for now - watch platelet count  Will ask SLP team to perform MBS today or tomorrow to reassess swallowing - does patient need ENT consult?   What is the source of dysphagia?  Rexene Alberts, MD 07/14/2016 7:58 AM

## 2016-07-14 NOTE — Progress Notes (Signed)
Hypoglycemic Event  CBG: 49  Treatment: D50 IV 25 mL  Symptoms: Sweaty, otherwise asymptomatic  Follow-up CBG: Time: 0423 CBG Result: 64, additional 50 mL D50 given @ .  Additional Follow-up CBG: Time: 0503 CBG Result: 108  Possible Reasons for Event: Inadequate meal intake, pt NPO.  Comments/MD notified: Dr. Cyndia Bent notified, received verbal order to D/C Levemir 20 units BID and change fluids from NS to D5NS @ same rate. Will continue to monitor closely.    Sherlie Ban, RN

## 2016-07-14 NOTE — Care Management Important Message (Signed)
Important Message  Patient Details  Name: Christian Bowman MRN: [id number] Date of Birth: 1946/02/02   Medicare Important Message Given:  Yes    [name] May 07/14/2016, 9:58 AM

## 2016-07-14 NOTE — Progress Notes (Signed)
Right IJ sleeve and CVC removed per orders, 30 minute bedrest period begins @ [id number] and ends @ 0623. Will continue to closely monitor.  Sherlie Ban, RN

## 2016-07-14 NOTE — Progress Notes (Signed)
Per MD Bartle, OK to discontinue R IJ double lumen CVC along with Cordis. Will implement and continue to monitor.  Sherlie Ban, RN

## 2016-07-15 ENCOUNTER — Inpatient Hospital Stay (HOSPITAL_COMMUNITY): Payer: Medicare Other

## 2016-07-15 LAB — BASIC METABOLIC PANEL
Anion gap: 8 (ref 5–15)
BUN: 17 mg/dL (ref 6–20)
CALCIUM: 8.1 mg/dL — AB (ref 8.9–10.3)
CO2: 29 mmol/L (ref 22–32)
CREATININE: 1.33 mg/dL — AB (ref 0.61–1.24)
Chloride: 105 mmol/L (ref 101–111)
GFR calc Af Amer: >60 mL/min (ref >60)
GFR, EST NON AFRICAN AMERICAN: 53 mL/min — AB (ref >60)
Glucose, Bld: 122 mg/dL — ABNORMAL HIGH (ref 65–99)
Potassium: 2.9 mmol/L — ABNORMAL LOW (ref 3.5–5.1)
SODIUM: 142 mmol/L (ref 135–145)

## 2016-07-15 LAB — CBC
HCT: 29.3 % — ABNORMAL LOW (ref 39.0–52.0)
Hemoglobin: 9.8 g/dL — ABNORMAL LOW (ref 13.0–17.0)
MCH: 28.6 pg (ref 26.0–34.0)
MCHC: 33.4 g/dL (ref 30.0–36.0)
MCV: 85.4 fL (ref 78.0–100.0)
PLATELETS: 92 10*3/uL — AB (ref 150–400)
RBC: 3.43 MIL/uL — ABNORMAL LOW (ref 4.22–5.81)
RDW: 14.7 % (ref 11.5–15.5)
WBC: 13 10*3/uL — AB (ref 4.0–10.5)

## 2016-07-15 LAB — GLUCOSE, CAPILLARY
GLUCOSE-CAPILLARY: 125 mg/dL — AB (ref 65–99)
GLUCOSE-CAPILLARY: 127 mg/dL — AB (ref 65–99)
GLUCOSE-CAPILLARY: 144 mg/dL — AB (ref 65–99)
Glucose-Capillary: 119 mg/dL — ABNORMAL HIGH (ref 65–99)
Glucose-Capillary: 122 mg/dL — ABNORMAL HIGH (ref 65–99)
Glucose-Capillary: 125 mg/dL — ABNORMAL HIGH (ref 65–99)

## 2016-07-15 LAB — COOXEMETRY PANEL
CARBOXYHEMOGLOBIN: 1.6 % — AB (ref 0.5–1.5)
Methemoglobin: 0.7 % (ref 0.0–1.5)
O2 SAT: 59.3 %
Total hemoglobin: 8.9 g/dL — ABNORMAL LOW (ref 12.0–16.0)

## 2016-07-15 LAB — PROTIME-INR
INR: 1.13
PROTHROMBIN TIME: 14.5 s (ref 11.4–15.2)

## 2016-07-15 MED ORDER — POTASSIUM CHLORIDE 10 MEQ/50ML IV SOLN
10.0000 meq | INTRAVENOUS | Status: AC
  Administered 2016-07-15 (×2): 10 meq via INTRAVENOUS
  Filled 2016-07-15: qty 50

## 2016-07-15 MED ORDER — POTASSIUM CHLORIDE 10 MEQ/50ML IV SOLN
10.0000 meq | INTRAVENOUS | Status: AC | PRN
  Administered 2016-07-15 (×3): 10 meq via INTRAVENOUS
  Filled 2016-07-15 (×2): qty 50

## 2016-07-15 MED ORDER — FUROSEMIDE 10 MG/ML IJ SOLN
20.0000 mg | Freq: Two times a day (BID) | INTRAMUSCULAR | Status: DC
  Administered 2016-07-15 – 2016-07-16 (×3): 20 mg via INTRAVENOUS
  Filled 2016-07-15 (×3): qty 2

## 2016-07-15 MED ORDER — JEVITY 1.5 CAL/FIBER PO LIQD
237.0000 mL | ORAL | Status: DC
  Filled 2016-07-15 (×4): qty 1000

## 2016-07-15 MED ORDER — KCL IN DEXTROSE-NACL 20-5-0.9 MEQ/L-%-% IV SOLN
INTRAVENOUS | Status: DC
  Administered 2016-07-15: 40 mL/h via INTRAVENOUS
  Filled 2016-07-15: qty 1000

## 2016-07-15 NOTE — Progress Notes (Signed)
Speech Language Pathology Treatment: Dysphagia  Patient Details Name: Christian Bowman MRN: [id number] DOB: May 26, 1946 Today's Date: 07/15/2016 Time: [id number] SLP Time Calculation (min) (ACUTE ONLY): 35 min  Assessment / Plan / Recommendation Clinical Impression  SLP followed up for PO trials. Pt continues to exhibit symptoms at bedside consistent with previous objective swallow evalutions: delayed throat clear, delayed coughing, and delayed wet vocal quality across PO consistencies concerning for reduced airway protection. Suspect these symptoms are consistent with previously identified esophageal issues per MBS including backflow in pharynx and laryngeal vestibule. RN reports temporary alternative nutrition planned this date as well as esophageal testing to be completed. Continue NPO with ice chips following oral care. ST to continue to monitor for PO readiness and results of further esophageal testing.     HPI HPI: Patient is a 70 year old male who underwent redo coronary artery bypass grafting 2 and mitral valve replacement on 10/10.  Bleeding noted after surgery, returned for surgical reexploration. Extubated 10/11. Noted to have some coughing with liquids that evening.       SLP Plan        Recommendations  Diet recommendations: NPO Medication Administration: Via alternative means                Oral Care Recommendations: Oral care QID       GO               Arvil Chaco MA, [place] Language Pathologist    Levi Aland 07/15/2016, 1:55 PM

## 2016-07-15 NOTE — Progress Notes (Signed)
4 Days Post-Op Procedure(s) (LRB): Re-exploration (CABG) for post op bleeding, (N/A) Subjective: Remains nothing by mouth, will need feeding tube placement and continuous tube feeds Heart rate 44 bpm with atrial flutter-will stop IV amiodarone Confirmed Lasix drip to twice a day dosing Platelet count slowly rising Patient ambulating in hallway Denies sore throat or previous serious swallow difficulty  Objective: Vital signs in last 24 hours: Temp:  [97.9 F (36.6 C)-98.6 F (37 C)] 98.5 F (36.9 C) (10/14 0700) Pulse Rate:  [43-71] 70 (10/14 1100) Cardiac Rhythm: Ventricular paced;Atrial flutter (10/14 0800) Resp:  [18-29] 23 (10/14 1100) BP: (115-155)/(60-107) 127/107 (10/14 1100) SpO2:  [90 %-100 %] 95 % (10/14 1100) Weight:  [198 lb 3.1 oz (89.9 kg)] 198 lb 3.1 oz (89.9 kg) (10/14 0500)  Hemodynamic parameters for last 24 hours:    Intake/Output from previous day: 10/13 0701 - 10/14 0700 In: 1211.3 [I.V.:761.3; IV Piggyback:450] Out: 1880 [Urine:1400; Chest Tube:480] Intake/Output this shift: Total I/O In: 250.3 [I.V.:100.3; IV Piggyback:150] Out: 800 [Urine:550; Chest Tube:250]  Alert and comfortable Lungs clear Chest tube drainage still approximately 500 cc per day without airleak  Lab Results:  Recent Labs  07/14/16 0345 07/15/16 0425  WBC 12.7* 13.0*  HGB 9.4* 9.8*  HCT 27.9* 29.3*  PLT 83* 92*   BMET:  Recent Labs  07/14/16 1714 07/15/16 0425  NA 142 142  K 3.4* 2.9*  CL 105 105  CO2 28 29  GLUCOSE 113* 122*  BUN 16 17  CREATININE 1.28* 1.33*  CALCIUM 8.3* 8.1*    PT/INR:  Recent Labs  07/15/16 0425  LABPROT 14.5  INR 1.13   ABG    Component Value Date/Time   PHART 7.373 07/12/2016 1531   HCO3 23.3 07/12/2016 1531   TCO2 25 07/12/2016 1531   ACIDBASEDEF 2.0 07/12/2016 1531   O2SAT 59.3 07/15/2016 0440   CBG (last 3)   Recent Labs  07/14/16 2016 07/15/16 0021 07/15/16 0411  GLUCAP 107* 122* 119*    Assessment/Plan: S/P  Procedure(s) (LRB): Re-exploration (CABG) for post op bleeding, (N/A) Place feeding tube and start Jevity Leave chest tubes in place   LOS: 4 days    Christian Bowman 07/15/2016

## 2016-07-15 NOTE — Progress Notes (Signed)
Unable to place Cortrak feeding tube on patient due to strong pharyngeal reflex, reported pain and a difficult to navigate nare. Pt unable to tolerate more then a few seconds of attempted placement. He declined to try again. RN aware. Will d/c order.   Burtis Junes RD, LDN, CNSC Clinical Nutrition Pager: [phone] 07/15/2016 4:59 PM

## 2016-07-16 ENCOUNTER — Inpatient Hospital Stay (HOSPITAL_COMMUNITY): Payer: Medicare Other

## 2016-07-16 LAB — GLUCOSE, CAPILLARY
GLUCOSE-CAPILLARY: 121 mg/dL — AB (ref 65–99)
GLUCOSE-CAPILLARY: 102 mg/dL — AB (ref 65–99)
Glucose-Capillary: 138 mg/dL — ABNORMAL HIGH (ref 65–99)
Glucose-Capillary: 127 mg/dL — ABNORMAL HIGH (ref 65–99)
Glucose-Capillary: 142 mg/dL — ABNORMAL HIGH (ref 65–99)

## 2016-07-16 LAB — CBC
HCT: 30.6 % — ABNORMAL LOW (ref 39.0–52.0)
Hemoglobin: 10.1 g/dL — ABNORMAL LOW (ref 13.0–17.0)
MCH: 28.3 pg (ref 26.0–34.0)
MCHC: 33 g/dL (ref 30.0–36.0)
MCV: 85.7 fL (ref 78.0–100.0)
PLATELETS: 127 10*3/uL — AB (ref 150–400)
RBC: 3.57 MIL/uL — ABNORMAL LOW (ref 4.22–5.81)
RDW: 14.5 % (ref 11.5–15.5)
WBC: 10.9 10*3/uL — ABNORMAL HIGH (ref 4.0–10.5)

## 2016-07-16 LAB — COMPREHENSIVE METABOLIC PANEL
ALT: 29 U/L (ref 17–63)
AST: 33 U/L (ref 15–41)
Albumin: 2.5 g/dL — ABNORMAL LOW (ref 3.5–5.0)
Alkaline Phosphatase: 51 U/L (ref 38–126)
Anion gap: 9 (ref 5–15)
BUN: 21 mg/dL — ABNORMAL HIGH (ref 6–20)
CO2: 27 mmol/L (ref 22–32)
Calcium: 8.2 mg/dL — ABNORMAL LOW (ref 8.9–10.3)
Chloride: 105 mmol/L (ref 101–111)
Creatinine, Ser: 1.32 mg/dL — ABNORMAL HIGH (ref 0.61–1.24)
GFR calc Af Amer: >60 mL/min (ref >60)
GFR calc non Af Amer: 54 mL/min — ABNORMAL LOW (ref >60)
Glucose, Bld: 146 mg/dL — ABNORMAL HIGH (ref 65–99)
Potassium: 3 mmol/L — ABNORMAL LOW (ref 3.5–5.1)
Sodium: 141 mmol/L (ref 135–145)
Total Bilirubin: 1.7 mg/dL — ABNORMAL HIGH (ref 0.3–1.2)
Total Protein: 5.2 g/dL — ABNORMAL LOW (ref 6.5–8.1)

## 2016-07-16 LAB — COOXEMETRY PANEL
Carboxyhemoglobin: 1.6 % — ABNORMAL HIGH (ref 0.5–1.5)
Methemoglobin: 1 % (ref 0.0–1.5)
O2 Saturation: 53.9 %
Total hemoglobin: 10.5 g/dL — ABNORMAL LOW (ref 12.0–16.0)

## 2016-07-16 LAB — MAGNESIUM: Magnesium: 2 mg/dL (ref 1.7–2.4)

## 2016-07-16 LAB — TRIGLYCERIDES: TRIGLYCERIDES: 107 mg/dL (ref <150)

## 2016-07-16 LAB — PREALBUMIN: PREALBUMIN: 12.2 mg/dL — AB (ref 18–38)

## 2016-07-16 LAB — PROTIME-INR
INR: 1.31
PROTHROMBIN TIME: 16.4 s — AB (ref 11.4–15.2)

## 2016-07-16 LAB — PHOSPHORUS: Phosphorus: 2.8 mg/dL (ref 2.5–4.6)

## 2016-07-16 MED ORDER — IOPAMIDOL (ISOVUE-300) INJECTION 61%
150.0000 mL | Freq: Once | INTRAVENOUS | Status: AC | PRN
  Administered 2016-07-16: 75 mL via ORAL

## 2016-07-16 MED ORDER — POTASSIUM CHLORIDE 10 MEQ/50ML IV SOLN
10.0000 meq | INTRAVENOUS | Status: AC
  Administered 2016-07-16 (×4): 10 meq via INTRAVENOUS
  Filled 2016-07-16: qty 50

## 2016-07-16 MED ORDER — IOPAMIDOL (ISOVUE-300) INJECTION 61%
INTRAVENOUS | Status: AC
  Filled 2016-07-16: qty 50

## 2016-07-16 MED ORDER — TRACE MINERALS CR-CU-MN-SE-ZN 10-1000-500-60 MCG/ML IV SOLN
INTRAVENOUS | Status: AC
  Administered 2016-07-16: 17:00:00 via INTRAVENOUS
  Filled 2016-07-16: qty 960

## 2016-07-16 MED ORDER — TRAVASOL 10 % IV SOLN: INTRAVENOUS | Status: DC

## 2016-07-16 MED ORDER — POTASSIUM CHLORIDE 10 MEQ/50ML IV SOLN
10.0000 meq | INTRAVENOUS | Status: AC
  Administered 2016-07-16 (×3): 10 meq via INTRAVENOUS
  Filled 2016-07-16 (×4): qty 50

## 2016-07-16 NOTE — Progress Notes (Signed)
5 Days Post-Op Procedure(s) (LRB): Re-exploration (CABG) for post op bleeding, (N/A) Subjective: Could not tolerate feeding tube because of the severe gag reflex TPN started this p.m. Barium esophagram shows no anatomical or motility abnormality Patient remains nothing by mouth, walking and hallways Objective: Vital signs in last 24 hours: Temp:  [97.2 F (36.2 C)-98.1 F (36.7 C)] 98 F (36.7 C) (10/15 1500) Pulse Rate:  [69-98] 70 (10/15 1800) Cardiac Rhythm: Ventricular paced (10/15 1600) Resp:  [15-33] 28 (10/15 1800) BP: (115-150)/(60-121) 137/121 (10/15 1700) SpO2:  [92 %-100 %] 98 % (10/15 1800) Weight:  [196 lb 13.9 oz (89.3 kg)] 196 lb 13.9 oz (89.3 kg) (10/15 0300)  Hemodynamic parameters for last 24 hours:   Stable Intake/Output from previous day: 10/14 0701 - 10/15 0700 In: 1393.9 [I.V.:1043.9; IV Piggyback:350] Out: [id number] [Urine:1150; Chest Tube:520] Intake/Output this shift: No intake/output data recorded.  Still has significant chest tube drainage-tubes remain  Lab Results:  Recent Labs  07/15/16 0425 07/16/16 0300  WBC 13.0* 10.9*  HGB 9.8* 10.1*  HCT 29.3* 30.6*  PLT 92* 127*   BMET:  Recent Labs  07/15/16 0425 07/16/16 0300  NA 142 141  K 2.9* 3.0*  CL 105 105  CO2 29 27  GLUCOSE 122* 146*  BUN 17 21*  CREATININE 1.33* 1.32*  CALCIUM 8.1* 8.2*    PT/INR:  Recent Labs  07/16/16 0300  LABPROT 16.4*  INR 1.31   ABG    Component Value Date/Time   PHART 7.373 07/12/2016 1531   HCO3 23.3 07/12/2016 1531   TCO2 25 07/12/2016 1531   ACIDBASEDEF 2.0 07/12/2016 1531   O2SAT 53.9 07/16/2016 0310   CBG (last 3)   Recent Labs  07/16/16 0806 07/16/16 1213 07/16/16 1639  GLUCAP 121* 127* 102*    Assessment/Plan: S/P Procedure(s) (LRB): Re-exploration (CABG) for post op bleeding, (N/A) Continue with speech therapy to allow.to begin   LOS: 5 days    Tharon Aquas Trigt III 07/16/2016

## 2016-07-16 NOTE — Progress Notes (Addendum)
[facility] NOTE   Pharmacy Consult for TPN Indication: unable to place feeding tube  Patient Measurements: Height: 6' (182.9 cm) Weight: 196 lb 13.9 oz (89.3 kg) IBW/kg (Calculated) : 77.6 TPN AdjBW (KG): 92 Body mass index is 26.7 kg/m. Usual Weight: 81.6 kg  Assessment:  70 y/o male s/p CABGx2 and bio MVR on 10/10 that required re-exploration for bleeding that night. Post-op he failed his swallow eval and was made NPO - unclear source for swallowing dysfunction. MBS suggests esophageal issue. RD attempted to place Cortrak feeding tube for nutrition but was unsuccessful and patient declined to try again. Patient has been essentially NPO since 10/10.  GI: intact but with possible esophagel issue; bisacodyl, Pepcid IV Endo: DM2 on TCTS SSI - CBGs controlled Insulin requirements in the past 24 hours: 4 units Lytes: K 3 - 3 runs given per MD, Mag 2, Phos 2.8 Renal: SCr 1.32 - IV furosemide Pulm: Cards: redo CABG and bio MVR then re-exploration for bleeding on 10/10 Hepatobil: LFTs wnl, Tbili elevated at 1.7, TG 107 Neuro: ID: afeb, WBC 10.9 AC: warfarin per MD  Best Practices: enox/warfarin TPN Access: double lumen PICC 10/12 TPN start date: 10/15>>  Nutritional Goals: [id number] Protein: 115-140 g  Current Nutrition:  NPO  Plan:  Begin Clinimix E 5/15 at 40 ml/hr - this provides 48 g of protein and 682 kCals per day meeting 41% of protein and 30% of kCal needs Hold 20% lipid emulsion for first 7 days for ICU patients per ASPEN guidelines (Start date 10/17) Begin MVI and trace elements in TPN Discontinue IV Pepcid and place 40 mg in TPN Continue SSI q4h and adjust as needed Monitor TPN labs F/U RD recommendations Paged Dr. Nils Pyle about replacing K - no call back. TCTS to replace lytes unless pharmacy asked to do so Paged RD twice to discuss options with no call back  Renold Genta, PharmD, BCPS Clinical  Pharmacist Phone for today - [place] - [phone] 07/16/2016 10:56 AM    Addendum: spoke with Dr. Prescott Gum and ok for pharmacy to replace lytes.  Give another 4 runs of KCl - recheck K in am  Renold Genta, PharmD, BCPS Clinical Pharmacist 07/16/2016 3:03 PM

## 2016-07-17 ENCOUNTER — Inpatient Hospital Stay (HOSPITAL_COMMUNITY): Payer: Medicare Other

## 2016-07-17 DIAGNOSIS — I483 Typical atrial flutter: Secondary | ICD-10-CM

## 2016-07-17 DIAGNOSIS — I5023 Acute on chronic systolic (congestive) heart failure: Secondary | ICD-10-CM

## 2016-07-17 LAB — GLUCOSE, CAPILLARY
GLUCOSE-CAPILLARY: 140 mg/dL — AB (ref 65–99)
GLUCOSE-CAPILLARY: 166 mg/dL — AB (ref 65–99)
Glucose-Capillary: 118 mg/dL — ABNORMAL HIGH (ref 65–99)
Glucose-Capillary: 129 mg/dL — ABNORMAL HIGH (ref 65–99)
Glucose-Capillary: 131 mg/dL — ABNORMAL HIGH (ref 65–99)
Glucose-Capillary: 132 mg/dL — ABNORMAL HIGH (ref 65–99)
Glucose-Capillary: 134 mg/dL — ABNORMAL HIGH (ref 65–99)
Glucose-Capillary: 188 mg/dL — ABNORMAL HIGH (ref 65–99)

## 2016-07-17 LAB — DIFFERENTIAL
BASOS ABS: 0 10*3/uL (ref 0.0–0.1)
Basophils Relative: 0 %
EOS PCT: 5 %
Eosinophils Absolute: 0.5 10*3/uL (ref 0.0–0.7)
LYMPHS PCT: 12 %
Lymphs Abs: 1.1 10*3/uL (ref 0.7–4.0)
Monocytes Absolute: 1.2 10*3/uL — ABNORMAL HIGH (ref 0.1–1.0)
Monocytes Relative: 12 %
NEUTROS ABS: 7 10*3/uL (ref 1.7–7.7)
NEUTROS PCT: 71 %

## 2016-07-17 LAB — PREALBUMIN: PREALBUMIN: 11.2 mg/dL — AB (ref 18–38)

## 2016-07-17 LAB — COOXEMETRY PANEL
CARBOXYHEMOGLOBIN: 2.1 % — AB (ref 0.5–1.5)
Carboxyhemoglobin: 1.7 % — ABNORMAL HIGH (ref 0.5–1.5)
METHEMOGLOBIN: 0.8 % (ref 0.0–1.5)
Methemoglobin: 0.7 % (ref 0.0–1.5)
O2 SAT: 51.6 %
O2 Saturation: 56 %
TOTAL HEMOGLOBIN: 10.1 g/dL — AB (ref 12.0–16.0)
Total hemoglobin: 10.7 g/dL — ABNORMAL LOW (ref 12.0–16.0)

## 2016-07-17 LAB — CBC
HCT: 30.1 % — ABNORMAL LOW (ref 39.0–52.0)
Hemoglobin: 9.7 g/dL — ABNORMAL LOW (ref 13.0–17.0)
MCH: 27.9 pg (ref 26.0–34.0)
MCHC: 32.2 g/dL (ref 30.0–36.0)
MCV: 86.5 fL (ref 78.0–100.0)
PLATELETS: 152 10*3/uL (ref 150–400)
RBC: 3.48 MIL/uL — AB (ref 4.22–5.81)
RDW: 14.4 % (ref 11.5–15.5)
WBC: 9.8 10*3/uL (ref 4.0–10.5)

## 2016-07-17 LAB — COMPREHENSIVE METABOLIC PANEL
ALT: 36 U/L (ref 17–63)
AST: 48 U/L — ABNORMAL HIGH (ref 15–41)
Albumin: 2.4 g/dL — ABNORMAL LOW (ref 3.5–5.0)
Alkaline Phosphatase: 48 U/L (ref 38–126)
Anion gap: 7 (ref 5–15)
BUN: 22 mg/dL — ABNORMAL HIGH (ref 6–20)
CO2: 28 mmol/L (ref 22–32)
Calcium: 8.2 mg/dL — ABNORMAL LOW (ref 8.9–10.3)
Chloride: 107 mmol/L (ref 101–111)
Creatinine, Ser: 1.29 mg/dL — ABNORMAL HIGH (ref 0.61–1.24)
GFR calc Af Amer: >60 mL/min (ref >60)
GFR calc non Af Amer: 55 mL/min — ABNORMAL LOW (ref >60)
Glucose, Bld: 142 mg/dL — ABNORMAL HIGH (ref 65–99)
Potassium: 3.3 mmol/L — ABNORMAL LOW (ref 3.5–5.1)
Sodium: 142 mmol/L (ref 135–145)
Total Bilirubin: 1.8 mg/dL — ABNORMAL HIGH (ref 0.3–1.2)
Total Protein: 5 g/dL — ABNORMAL LOW (ref 6.5–8.1)

## 2016-07-17 LAB — BASIC METABOLIC PANEL
Anion gap: 7 (ref 5–15)
BUN: 20 mg/dL (ref 6–20)
CO2: 29 mmol/L (ref 22–32)
CREATININE: 1.25 mg/dL — AB (ref 0.61–1.24)
Calcium: 7.9 mg/dL — ABNORMAL LOW (ref 8.9–10.3)
Chloride: 103 mmol/L (ref 101–111)
GFR, EST NON AFRICAN AMERICAN: 57 mL/min — AB (ref >60)
Glucose, Bld: 147 mg/dL — ABNORMAL HIGH (ref 65–99)
POTASSIUM: 3.4 mmol/L — AB (ref 3.5–5.1)
SODIUM: 139 mmol/L (ref 135–145)

## 2016-07-17 LAB — TRIGLYCERIDES: TRIGLYCERIDES: 99 mg/dL (ref <150)

## 2016-07-17 LAB — MAGNESIUM: Magnesium: 2.1 mg/dL (ref 1.7–2.4)

## 2016-07-17 LAB — PHOSPHORUS: PHOSPHORUS: 2.9 mg/dL (ref 2.5–4.6)

## 2016-07-17 LAB — HEPARIN LEVEL (UNFRACTIONATED): Heparin Unfractionated: <0.1 IU/mL — ABNORMAL LOW (ref 0.30–0.70)

## 2016-07-17 MED ORDER — LOSARTAN POTASSIUM 25 MG PO TABS
12.5000 mg | ORAL_TABLET | Freq: Every day | ORAL | Status: DC
  Administered 2016-07-17 – 2016-07-18 (×2): 12.5 mg via ORAL
  Filled 2016-07-17 (×2): qty 1

## 2016-07-17 MED ORDER — ENSURE ENLIVE PO LIQD
237.0000 mL | Freq: Two times a day (BID) | ORAL | Status: DC
  Administered 2016-07-17 – 2016-07-31 (×16): 237 mL via ORAL

## 2016-07-17 MED ORDER — HEPARIN (PORCINE) IN NACL 100-0.45 UNIT/ML-% IJ SOLN
900.0000 [IU]/h | INTRAMUSCULAR | Status: DC
  Administered 2016-07-17: 900 [IU]/h via INTRAVENOUS
  Filled 2016-07-17: qty 250

## 2016-07-17 MED ORDER — POTASSIUM CHLORIDE 10 MEQ/50ML IV SOLN
10.0000 meq | INTRAVENOUS | Status: AC
  Administered 2016-07-17 (×3): 10 meq via INTRAVENOUS
  Filled 2016-07-17: qty 50

## 2016-07-17 MED ORDER — HEPARIN (PORCINE) IN NACL 100-0.45 UNIT/ML-% IJ SOLN
1350.0000 [IU]/h | INTRAMUSCULAR | Status: DC
  Administered 2016-07-18: 1350 [IU]/h via INTRAVENOUS
  Filled 2016-07-17: qty 250

## 2016-07-17 MED ORDER — POTASSIUM CHLORIDE 10 MEQ/50ML IV SOLN
10.0000 meq | INTRAVENOUS | Status: AC | PRN
  Administered 2016-07-17 (×3): 10 meq via INTRAVENOUS
  Filled 2016-07-17: qty 50

## 2016-07-17 MED ORDER — SPIRONOLACTONE 25 MG PO TABS
12.5000 mg | ORAL_TABLET | Freq: Every day | ORAL | Status: DC
  Administered 2016-07-17 – 2016-07-18 (×2): 12.5 mg via ORAL
  Filled 2016-07-17 (×2): qty 1

## 2016-07-17 MED ORDER — FUROSEMIDE 10 MG/ML IJ SOLN
40.0000 mg | Freq: Three times a day (TID) | INTRAMUSCULAR | Status: DC
  Administered 2016-07-17 – 2016-07-19 (×7): 40 mg via INTRAVENOUS
  Filled 2016-07-17 (×7): qty 4

## 2016-07-17 MED ORDER — ROSUVASTATIN CALCIUM 10 MG PO TABS
10.0000 mg | ORAL_TABLET | Freq: Every day | ORAL | Status: DC
  Administered 2016-07-17 – 2016-07-31 (×15): 10 mg via ORAL
  Filled 2016-07-17 (×15): qty 1

## 2016-07-17 NOTE — Progress Notes (Signed)
ANTICOAGULATION CONSULT NOTE - Initial Consult  Pharmacy Consult for Heparin Indication: tissue MVR / aflutter  Allergies  Allergen Reactions  . Xanax [Alprazolam] Other (See Comments)    Pt feels very weak, tired and feels paralyzed      Patient Measurements: Height: 6' (182.9 cm) Weight: 196 lb 13.9 oz (89.3 kg) IBW/kg (Calculated) : 77.6   Vital Signs: Temp: 97.6 F (36.4 C) (10/16 0927) Temp Source: Oral (10/16 0927) BP: 134/77 (10/16 0700) Pulse Rate: 65 (10/16 0700)  Labs:  Recent Labs  07/15/16 0425 07/16/16 0300 07/17/16 0335  HGB 9.8* 10.1* 9.7*  HCT 29.3* 30.6* 30.1*  PLT 92* 127* 152  LABPROT 14.5 16.4*  --   INR 1.13 1.31  --   CREATININE 1.33* 1.32* 1.29*    Estimated Creatinine Clearance: 60.2 mL/min (by C-G formula based on SCr of 1.29 mg/dL (H)).   Medical History: Past Medical History:  Diagnosis Date  . Anginal pain ([place])   . CAD (coronary artery disease)   . Coronary artery disease involving coronary bypass graft   . Cyst of neck    right side  . DM2 (diabetes mellitus, type 2) ([place]) 08/26/2013  . Dyspnea   . Heart attack   . HTN (hypertension) 08/26/2013  . Hyperlipidemia 08/26/2013  . Left main coronary artery disease   . Left renal artery stenosis ([place])    Genesis 6x12 stent 2007  . Obesity (BMI 30.0-34.9) 08/26/2013  . Postoperative atrial fibrillation ([place]) 10/15/2005  . S/P CABG x 4 10/13/2005   LIMA to LAD, SVG to intermediate branch, sequential SVG to PDA and RPL branch, EVH via right thigh  . S/P mitral valve replacement with bioprosthetic valve 07/11/2016   31 mm [facility] Mitral bovine bioprosthetic tissue valve  . S/P redo CABG x 2 07/11/2016   SVG to PDA and SVG to Intermediate Branch, EVH via left thigh    Assessment: 70 year old male to begin heparin for tissue MVR / Aflutter post-operatively Coumadin on hold Chest tubes have been discontinued  Goal of Therapy:  Heparin level 0.3-0.5 units/ml Monitor  platelets by anticoagulation protocol: Yes   Plan:  Heparin at 900 units / hr Heparin level 8 hours after heparin begins Daily heparin level, CBC  Thank you Anette Guarneri PharmD [phone]  07/17/2016,11:18 AM

## 2016-07-17 NOTE — Progress Notes (Signed)
Speech Language Pathology Treatment: Dysphagia  Patient Details Name: Christian Bowman MRN: [id number] DOB: August 27, 1946 Today's Date: 07/17/2016 Time: [id number] SLP Time Calculation (min) (ACUTE ONLY): 25 min  Assessment / Plan / Recommendation Clinical Impression  Pt seen sitting upright in chair, looking brighter and stronger since this SLP's last visit, with stronger voicing and less fatigue during conversation. He consumed spoonfuls and cup sips of water without overt signs of aspiration that were previously observed. No overt signs of aspiration were noted with solid textures; however, there is still concern for pharyngeal residue given immediate coughing that followed liquid washes, concerning for aspiration. This happened after soft solids and even purees. Recommend to initiate a full liquid diet for today, providing oral medications crushed in puree. Instructed pt to volitionally clear his throat every few bites/sips in order to expel any possible penetrates. Suspect that as pt continues to progress and rebuild his functional reserve, that he will be able to tolerate a more solid diet. Will continue to follow and advance diet as appropriate.   HPI HPI: Patient is a 70 year old male who underwent redo coronary artery bypass grafting 2 and mitral valve replacement on 10/10.  Bleeding noted after surgery, returned for surgical reexploration. Extubated 10/11. Noted to have some coughing with liquids that evening.       SLP Plan  Goals updated     Recommendations  Diet recommendations: Thin liquid Medication Administration: Crushed with puree                Oral Care Recommendations: Oral care BID Follow up Recommendations: Inpatient Rehab Plan: Goals updated       GO                Christian Bowman 07/17/2016, 9:29 AM  Christian Bowman, M.A. CCC-SLP [phone]

## 2016-07-17 NOTE — Consult Note (Addendum)
Advanced Heart Failure Team Consult Note  Referring Physician: Dr Roxy Manns  Primary Physician: Primary Cardiologist:  Dr Sallyanne Kuster  Reason for Consultation: Heart Failure/ A Flutter    HPI:   Mr Preval is a 70 year old with a history of CAD CABGx4 2007, ICM, combined systolic/diastolic HF, HTN, DMII, hyperlidemia. Cardiac history dates back to 2007 when he originally presented with hypertensive crisis with severe hypertension. He was found to have renal artery stenosis (no stenosis on [place] 2016)and left main and three-vessel coronary artery disease. He underwent coronary artery bypass grafting 4 with graft. Post op he developed A fib and had cardioversion.   He had been doing well until 2016 when had acute respiratory failure secondary to pulmonary edema. LHC cardiac catheterization and was found to have 100% occlusion of all of the main native coronary arteries with continued patency of the left internal mammary artery graft to the distal left anterior descending coronary artery, 100% occlusion of the vein graft to the left circumflex system, and 90% stenosis of an otherwise patent vein graft to the right coronary system. He underwent PCI and stenting of the vein graft to the right coronary system using a drug eluting stent.   He had 2 recent hospitalizations at [facility] for class IV symptoms and volume overload.  Dr. Sallyanne Kuster performed transesophageal echocardiogram on 06/15/2016. TEE revealed moderate to severe left ventricular chamber enlargement with ejection fraction estimated 35-40%. There was severe hypokinesis of the anterolateral and inferolateral myocardium. There was akinesis of the basal mid inferior myocardium. There was severe mitralregurgitation with an eccentric jet of regurgitation directed posteriorly and flow reversal in the pulmonary veins. There was moderate left atrial enlargement. Left and right heart catheterization was performed 06/21/2016 by Dr. Burt Knack. This confirmed the  presence of severe left main and three-vessel coronary artery disease with 100% occlusion of all of the native artery arteries.   Admitted 10/10 for redo CABG and MVR. Post operatively he has been weaned off drips except milrinone. Earlier today milrinone was increased to 0.25 mcg due to low mixed venous sat. Developed A Flutter on 10/12 and was on amio drip until 10/14 because his heart rate was 44. He has had difficulty swallowing post operatively and had ST evaluation today with recommendations to start thin liquids.   Denies SOB. Hungry . His wife says baseline weight 175-180.   [employer]: [y] = yes, [ ]  = no   General: Weight gain [Y ]; Weight loss [ ] ; Anorexia [ ] ; Fatigue [ ] ; Fever [ ] ; Chills [ ] ; Weakness [Y ]  Cardiac: Chest pain/pressure [ ] ; Resting SOB [ ] ; Exertional SOB [Y ]; Orthopnea [ ] ; Pedal Edema [ ] ; Palpitations [ ] ; Syncope [ ] ; Presyncope [ ] ; Paroxysmal nocturnal dyspnea[ ]   Pulmonary: Cough [ ] ; Wheezing[ ] ; Hemoptysis[ ] ; Sputum [ ] ; Snoring [ ]   GI: Vomiting[ ] ; Dysphagia[ ] ; Melena[ ] ; Hematochezia [ ] ; Heartburn[ ] ; Abdominal pain [ ] ; Constipation [ ] ; Diarrhea [ ] ; BRBPR [ ]   GU: Hematuria[ ] ; Dysuria [ ] ; Nocturia[ ]   Vascular: Pain in legs with walking [ ] ; Pain in feet with lying flat [ ] ; Non-healing sores [ ] ; Stroke [ ] ; TIA [ ] ; Slurred speech [ ] ;  Neuro: Headaches[ ] ; Vertigo[ ] ; Seizures[ ] ; Paresthesias[ ] ;Blurred vision [ ] ; Diplopia [ ] ; Vision changes [ ]   Ortho/Skin: Arthritis [ ] ; Joint pain [Y ]; Muscle pain [ ] ; Joint swelling [ ] ; Back Pain [ ] ; Rash [ ]   Psych: Depression[ ] ; Anxiety[ ]   Heme: Bleeding problems [ ] ; Clotting disorders [ ] ; Anemia [ ]   Endocrine: Diabetes [ Y]; Thyroid dysfunction[ ]   Home Medications Prior to Admission medications   Medication Sig Start Date End Date Taking? Authorizing Provider  amLODipine (NORVASC) 2.5 MG tablet Take 1 tablet (2.5 mg total) by mouth daily. Patient taking differently: Take 2.5 mg  by mouth every evening.  07/28/15  Yes Mihai Croitoru, MD  aspirin EC 81 MG tablet Take 81 mg by mouth every evening.   Yes Historical Provider, MD  carvedilol (COREG) 25 MG tablet Take 1 tablet (25 mg total) by mouth 2 (two) times daily. 07/28/15  Yes Mihai Croitoru, MD  clopidogrel (PLAVIX) 75 MG tablet TAKE ONE TABLET BY MOUTH ONCE DAILY 04/19/16  Yes Mihai Croitoru, MD  furosemide (LASIX) 40 MG tablet Take 1 tablet (40 mg total) by mouth 2 (two) times daily. 06/08/16  Yes Mihai Croitoru, MD  lisinopril (PRINIVIL,ZESTRIL) 40 MG tablet Take 1 tablet (40 mg total) by mouth daily. Patient taking differently: Take 40 mg by mouth every evening.  03/08/16  Yes Mihai Croitoru, MD  metFORMIN (GLUCOPHAGE) 500 MG tablet Take 1 tablet (500 mg total) by mouth 2 (two) times daily with a meal. 06/22/16  Yes Cassandria Anger, MD  potassium chloride (K-DUR,KLOR-CON) 10 MEQ tablet Take 2 tablets (20 mEq total) by mouth 2 (two) times daily. 06/08/16  Yes Mihai Croitoru, MD  rosuvastatin (CRESTOR) 10 MG tablet Take 1 tablet (10 mg total) by mouth daily. Patient taking differently: Take 10 mg by mouth every evening.  07/28/15  Yes Mihai Croitoru, MD  glucose blood (ACCU-CHEK AVIVA) test strip Use as instructed bid E11.65 06/07/16   Cassandria Anger, MD    Past Medical History: Past Medical History:  Diagnosis Date  . Anginal pain ([place])   . CAD (coronary artery disease)   . Coronary artery disease involving coronary bypass graft   . Cyst of neck    right side  . DM2 (diabetes mellitus, type 2) ([place]) 08/26/2013  . Dyspnea   . Heart attack   . HTN (hypertension) 08/26/2013  . Hyperlipidemia 08/26/2013  . Left main coronary artery disease   . Left renal artery stenosis ([place])    Genesis 6x12 stent 2007  . Obesity (BMI 30.0-34.9) 08/26/2013  . Postoperative atrial fibrillation ([place]) 10/15/2005  . S/P CABG x 4 10/13/2005   LIMA to LAD, SVG to intermediate branch, sequential SVG to PDA and RPL branch, EVH via  right thigh  . S/P mitral valve replacement with bioprosthetic valve 07/11/2016   31 mm [facility] Mitral bovine bioprosthetic tissue valve  . S/P redo CABG x 2 07/11/2016   SVG to PDA and SVG to Intermediate Branch, EVH via left thigh    Past Surgical History: Past Surgical History:  Procedure Laterality Date  . CARDIAC CATHETERIZATION N/A 06/21/2016   Procedure: Right/Left Heart Cath and Coronary/Graft Angiography;  Surgeon: Sherren Mocha, MD;  Location: [place] [facility];  Service: Cardiovascular;  Laterality: N/A;  . CORONARY ARTERY BYPASS GRAFT  10/13/2005   LIMA to LAD, SVG to intermediate branch, sequential SVG to PDA and RPL  . CORONARY ARTERY BYPASS GRAFT N/A 07/11/2016   Procedure: REDO CORONARY ARTERY BYPASS GRAFTING (CABG) x two using left leg greater saphenous vein harvested endoscopically-SVG to PDA -SVG to RAMUS INTERMEDIATE;  Surgeon: Rexene Alberts, MD;  Location: [place];  Service: Open Heart Surgery;  Laterality: N/A;  . CORONARY ARTERY  BYPASS GRAFT N/A 07/11/2016   Procedure: Re-exploration (CABG) for post op bleeding,;  Surgeon: Rexene Alberts, MD;  Location: [place];  Service: Open Heart Surgery;  Laterality: N/A;  . MITRAL VALVE REPLACEMENT N/A 07/11/2016   Procedure: MITRAL VALVE (MV) REPLACEMENT;  Surgeon: Rexene Alberts, MD;  Location: [place];  Service: Open Heart Surgery;  Laterality: N/A;  . MYOCARDICAL PERFUSION  10/09/2007   NORMAL PERFUSION IN ALL REGIONS;NO EVIDENCE OF INDUCIBLE ISCHEMIA;POST STRESS EF% 66  . PERCUTANEOUS CORONARY STENT INTERVENTION (PCI-S)     DES in SVG to right coronary artery system  . RENAL ARTERY STENT Right 2007  . RENAL DOPPLER  03/28/2010   RIGHT RA-NORMAL;LEFT PROXIMAL RA AT STENT-PATENT WITH NO EVIDENCE OF SIGN DIAMETER REDUCTION. R & L KIDNEYS: EQUAL IN SIZE,SYMMETRICAL IN SHAPE.  . TEE WITHOUT CARDIOVERSION N/A 06/15/2016   Procedure: TRANSESOPHAGEAL ECHOCARDIOGRAM (TEE);  Surgeon: Sanda Klein, MD;  Location: [facility] ENDOSCOPY;   Service: Cardiovascular;  Laterality: N/A;  . TEE WITHOUT CARDIOVERSION N/A 07/11/2016   Procedure: TRANSESOPHAGEAL ECHOCARDIOGRAM (TEE);  Surgeon: Rexene Alberts, MD;  Location: [place];  Service: Open Heart Surgery;  Laterality: N/A;  . TRANSESOPHAGEAL ECHOCARDIOGRAM  10/19/2005   NORMAL LV; MILD TO MODERATE AMOUNT OF SOFT ATHEROMATOUS PLAQUE OF THE THORACIC AORTA; THE LEFT ATRIUM IS MILDLY DILATED;LEFT ATRIAL APPENDAGE FUNCTION IS NORMAL;NO THROMBUS IDENTIFIED. SMALL PFO WITH RIGHT TO LEFT SHUNT    Family History: Family History  Problem Relation Age of Onset  . Heart attack Mother   . Heart disease Mother   . Heart attack Father   . Heart disease Father   . Hypertension Father   . Hypertension Sister   . Heart disease Maternal Grandmother     Social History: Social History   Social History  . Marital status: Married    Spouse name: N/A  . Number of children: N/A  . Years of education: N/A   Social History Main Topics  . Smoking status: Never Smoker  . Smokeless tobacco: Never Used  . Alcohol use No  . Drug use: No  . Sexual activity: Not Currently   Other Topics Concern  . None   Social History Narrative  . None    Allergies:  Allergies  Allergen Reactions  . Xanax [Alprazolam] Other (See Comments)    Pt feels very weak, tired and feels paralyzed      Objective:    Vital Signs:   Temp:  [97.2 F (36.2 C)-98.3 F (36.8 C)] 97.6 F (36.4 C) (10/16 0927) Pulse Rate:  [65-71] 65 (10/16 0700) Resp:  [15-33] 25 (10/16 0700) BP: (124-153)/(68-121) 134/77 (10/16 0700) SpO2:  [92 %-100 %] 99 % (10/16 0700) Weight:  [196 lb 13.9 oz (89.3 kg)] 196 lb 13.9 oz (89.3 kg) (10/16 0430) Last BM Date: 07/17/16  Weight change: Filed Weights   07/15/16 0500 07/16/16 0300 07/17/16 0430  Weight: 198 lb 3.1 oz (89.9 kg) 196 lb 13.9 oz (89.3 kg) 196 lb 13.9 oz (89.3 kg)    Intake/Output:   Intake/Output Summary (Last 24 hours) at 07/17/16 1045 Last data filed at  07/17/16 0700  Gross per 24 hour  Intake           794.43 ml  Output             1110 ml  Net          -315.57 ml     Physical Exam: General:  Well appearing. No resp difficulty. In bed  HEENT:  normal except R neck cyst.  Neck: supple. JVP ~10  . Carotids 2+ bilat; no bruits. No lymphadenopathy or thryomegaly appreciated. Cor: PMI nondisplaced. Regular rate & rhythm. No rubs, gallops or murmurs.Sternal Incision Ecchymotic. Approximated.  Lungs: clear Abdomen: soft, nontender, nondistended. No hepatosplenomegaly. No bruits or masses. Good bowel sounds. Extremities: no cyanosis, clubbing, rash, R and LLE trace edema Neuro: alert & orientedx3, cranial nerves grossly intact. moves all 4 extremities w/o difficulty. Affect pleasant  Telemetry:  A flutter 80   Labs: Basic Metabolic Panel:  Recent Labs Lab 07/12/16 0500 07/12/16 1500  07/14/16 0345 07/14/16 1714 07/15/16 0425 07/16/16 0300 07/17/16 0335  NA 139  --   < > 142 142 142 141 142  K 3.5  --   < > 2.9* 3.4* 2.9* 3.0* 3.3*  CL 109  --   < > 108 105 105 105 107  CO2 23  --   < > 29 28 29 27 28   GLUCOSE 130*  --   < > 124* 113* 122* 146* 142*  BUN 17  --   < > 19 16 17  21* 22*  CREATININE 1.01 1.16  < > 1.27* 1.28* 1.33* 1.32* 1.29*  CALCIUM 7.7*  --   < > 8.0* 8.3* 8.1* 8.2* 8.2*  MG 2.5* 2.2  --   --   --   --  2.0 2.1  PHOS  --   --   --   --   --   --  2.8 2.9  < > = values in this interval not displayed.  Liver Function Tests:  Recent Labs Lab 07/16/16 0300 07/17/16 0335  AST 33 48*  ALT 29 36  ALKPHOS 51 48  BILITOT 1.7* 1.8*  PROT 5.2* 5.0*  ALBUMIN 2.5* 2.4*   No results for input(s): LIPASE, AMYLASE in the last 168 hours. No results for input(s): AMMONIA in the last 168 hours.  CBC:  Recent Labs Lab 07/11/16 1542  07/13/16 0406 07/14/16 0345 07/15/16 0425 07/16/16 0300 07/17/16 0335  WBC 12.5*  < > 14.4* 12.7* 13.0* 10.9* 9.8  NEUTROABS 11.0*  --   --   --   --   --  7.0  HGB 9.2*  < >  10.2* 9.4* 9.8* 10.1* 9.7*  HCT 27.5*  < > 29.5* 27.9* 29.3* 30.6* 30.1*  MCV 84.6  < > 83.1 85.3 85.4 85.7 86.5  PLT 94*  < > 100* 83* 92* 127* 152  < > = values in this interval not displayed.  Cardiac Enzymes: No results for input(s): CKTOTAL, CKMB, CKMBINDEX, TROPONINI in the last 168 hours.  BNP: BNP (last 3 results) No results for input(s): BNP in the last 8760 hours.  ProBNP (last 3 results) No results for input(s): PROBNP in the last 8760 hours.   CBG:  Recent Labs Lab 07/16/16 1639 07/16/16 2053 07/17/16 0020 07/17/16 0331 07/17/16 0922  GLUCAP 102* 142* 129* 134* 166*    Coagulation Studies:  Recent Labs  07/15/16 0425 07/16/16 0300  LABPROT 14.5 16.4*  INR 1.13 1.31    Other results: EKG:   Imaging: Dg Esophagus  Result Date: 07/16/2016 CLINICAL DATA:  Assess for esophageal motility or injury after surgery. Difficulty swallowing. EXAM: ESOPHOGRAM/BARIUM SWALLOW TECHNIQUE: Single contrast examination was performed using  Isovue. FLUOROSCOPY TIME:  Fluoroscopy Time:  2 minutes and 48 seconds Number of Acquired Spot Images: 0, multiple saved images were obtained. COMPARISON:  None. FINDINGS: The esophagus is normal in caliber along its length  with no evidence of stricture or obstruction. A barium tablet was not utilized. No evidence of leak. Limited views of the stomach and proximal duodenum are normal. IMPRESSION: 1. No evidence of esophageal injury or dysmotility. Electronically Signed   By: Dorise Bullion III M.D   On: 07/16/2016 16:17   Dg Chest Port 1 View  Result Date: 07/17/2016 CLINICAL DATA:  Chest tube. EXAM: PORTABLE CHEST 1 VIEW COMPARISON:  Multiple recent previous exams. FINDINGS: Bilateral chest tubes remain in place. No evidence for pneumothorax on either side. Right PICC line tip overlies the mid SVC. The cardio pericardial silhouette is enlarged. No pulmonary edema or focal airspace consolidation. No substantial pleural effusion. Basilar  atelectasis seen previously has decreased in the interval. IMPRESSION: Persistent bilateral chest tubes without evidence for pneumothorax. Head Electronically Signed   By: Misty Stanley M.D.   On: 07/17/2016 09:00      Medications:     Current Medications: . bisacodyl  10 mg Oral Daily   Or  . bisacodyl  10 mg Rectal Daily  . furosemide  40 mg Intravenous Q8H  . insulin aspart  0-24 Units Subcutaneous Q4H  . mouth rinse  15 mL Mouth Rinse q12n4p  . potassium chloride  10 mEq Intravenous Q1 Hr x 3  . sodium chloride flush  10-40 mL Intracatheter Q12H  . sodium chloride flush  3 mL Intravenous Q12H  . Warfarin - Physician Dosing Inpatient   Does not apply q1800     Infusions: . Marland KitchenTPN (CLINIMIX-E) Adult 40 mL/hr at 07/17/16 0700  . sodium chloride    . milrinone 0.25 mcg/kg/min (07/17/16 [id number])      Assessment/Plan/Discussion    1. CAD: S/P Redo CABG. SVG to PDA, SVG to RAMUS INTERMEDIATE (N/A)+ MVR on 07/11/16.  Start statin. Was on crestor 10 mg daily at home.  2.  A/C combined systolic/diastolic heart failure- ICM . TEE 06/2016 EF 35-40% Mixed venous saturation 52% with milrinone increased to 0.25 mcg earlier today.  Volume status elevated. 10-15 pounds above his baseline weight. Continue 40 mg IV lasix three times daily. Set up CVP.  Add 12.5 mg spiro daily and 12.5 mg losartan. Anticipate switching to entresto if EF less than 40%. (prior to admit he was on 40 mg lisinopril daily).  Hold off on BB for now with low mixed venous saturation. 3. A flutter - developed A flutter post operatively.  Started on amio drip but later stopped on 10/14 due to bradycardia with heart in the 40s. Paced with rate controlled for now so will not re-challenge amio. Will need TEE/DC-CV . Starting heparin and coumadin today.  4.Anemia - expected blood loss. Today Hgb is 9.7  5. DMII-on sliding scale  6. HTN- prior to admit he was on several BP meds. Will watch closely.  7. Post Op Difficulty  Swallowing- ST evaluated today. Passed swallow evaluation today.   Length of Stay: [place] NP-C  07/17/2016, 10:45 AM  Advanced Heart Failure Team Pager [phone] (M-F; 7a - 4p)  Please contact [facility] for night-coverage after hours (4p -7a ) and weekends on [email or web address]  Patient seen with NP, agree with the above note.  1. CAD: s/p redo CABG with MVR.  Restarting statin.  2. Acute on chronic systolic CHF: Ischemic cardiomyopathy.  Pre-op EF 35-40%.  Low co-ox suggestive of low output post-op, milrinone increased today to 0.25.  He is volume overloaded on exam. - Follow CVP.  Will start Lasix 40 mg IV  every 8 hrs.  - Add losartan and spironolactone at low dose today.  - Repeat co-ox in am.  - Will need repeat echo, will try to wait until milrinone weaned down and back in NSR.  3. Bradycardia: Underlying slow atrial flutter.  Currently RV-pacing.  Plan cardioversion eventually.  If remains bradycardic, may need CRT.  4. Atrial flutter: Post-op.  Clear flutter waves on telemetry.  Had post-op atrial fibrillation after 1st CABG.  Amiodarone stopped because of bradycardia.  Starting heparin gtt/warfarin today.  Will plan diuresis today and probably tomorrow, aim for TEE-guided DCCV probably Wednesday if remains in flutter.   5. S/p bioprosthetic MVR: Will need eventual post-op echo.   Loralie Champagne 07/17/2016 12:11 PM

## 2016-07-17 NOTE — Progress Notes (Signed)
[place] NOTE   Pharmacy Consult for TPN Indication: unable to place feeding tube  Spoke with patient and he tolerated full liquid diet for lunch and without any problems. RD ordered Ensure bid. Expect patient will be able to eat so TPN not needed. Discussed with RN also.  Plan:  Discontinue TPN     Patient Measurements: Height: 6' (182.9 cm) Weight: 196 lb 13.9 oz (89.3 kg) IBW/kg (Calculated) : 77.6 TPN AdjBW (KG): 92 Body mass index is 26.7 kg/m. Usual Weight: 81.6 kg  Assessment:  70 y/o male s/p CABGx2 and bio MVR on 10/10 that required re-exploration for bleeding that night. Post-op he failed his swallow eval and was made NPO - unclear source for swallowing dysfunction. MBS suggests esophageal issue. RD attempted to place Cortrak feeding tube for nutrition but was unsuccessful and patient declined to try again. Patient has been essentially NPO since 10/10.  GI: intact but with possible esophagel issue; bisacodyl, Pepcid IV Endo: DM2 on TCTS SSI - CBGs controlled Insulin requirements in the past 24 hours: 4 units Lytes: K 3 - 3 runs given per MD, Mag 2, Phos 2.8 Renal: SCr 1.32 - IV furosemide Pulm: Cards: redo CABG and bio MVR then re-exploration for bleeding on 10/10 Hepatobil: LFTs wnl, Tbili elevated at 1.7, TG 107 Neuro: ID: afeb, WBC 10.9 AC: warfarin per MD  Best Practices: enox/warfarin TPN Access: double lumen PICC 10/12 TPN start date: 10/15>>  Nutritional Goals: [id number] Protein: 115-140 g  Current Nutrition:  NPO   Renold Genta, PharmD, BCPS Clinical Pharmacist Phone for today - [place] - [phone] 07/17/2016 12:36 PM

## 2016-07-17 NOTE — Progress Notes (Signed)
ANTICOAGULATION CONSULT NOTE - Follow Up Consult  Pharmacy Consult for Heparin Indication: tissue MVR / aflutter  Allergies  Allergen Reactions  . Xanax [Alprazolam] Other (See Comments)    Pt feels very weak, tired and feels paralyzed      Patient Measurements: Height: 6' (182.9 cm) Weight: 196 lb 13.9 oz (89.3 kg) IBW/kg (Calculated) : 77.6   Vital Signs: Temp: 98.1 F (36.7 C) (10/16 1637) Temp Source: Oral (10/16 1637) BP: 125/89 (10/16 2100) Pulse Rate: 66 (10/16 2100)  Labs:  Recent Labs  07/15/16 0425 07/16/16 0300 07/17/16 0335 07/17/16 1733 07/17/16 2133  HGB 9.8* 10.1* 9.7*  --   --   HCT 29.3* 30.6* 30.1*  --   --   PLT 92* 127* 152  --   --   LABPROT 14.5 16.4*  --   --   --   INR 1.13 1.31  --   --   --   HEPARINUNFRC  --   --   --   --  <0.10*  CREATININE 1.33* 1.32* 1.29* 1.25*  --     Estimated Creatinine Clearance: 62.1 mL/min (by C-G formula based on SCr of 1.25 mg/dL (H)).  Medications: Heparin @ 900 units/hr  Assessment: 70yom started on heparin earlier today for tissue MVR and aflutter. Initial heparin level is undetectable. No issues with infusion per RN. No bleeding.  Goal of Therapy:  Heparin level 0.3-0.5 units/ml Monitor platelets by anticoagulation protocol: Yes   Plan:  1) Increase heparin to 1100 units/hr 2) Daily heparin level, CBC  Nena Jordan, PharmD, BCPS 07/17/2016,10:05 PM

## 2016-07-17 NOTE — Progress Notes (Signed)
[address]Suite [address]       [place] [address]             [phone]        CARDIOTHORACIC SURGERY PROGRESS NOTE  R6 DaysPost-OpS/P Procedure(s) (LRB): REDO CORONARY ARTERY BYPASS GRAFTING (CABG) x two using left leg greater saphenous vein harvested endoscopically-SVG to PDA -SVG to RAMUS INTERMEDIATE (N/A) TRANSESOPHAGEAL ECHOCARDIOGRAM (TEE) (N/A) MITRAL VALVE (MV) REPLACEMENT (N/A)   R6 Days Post-Op Procedure(s) (LRB): Re-exploration (CABG) for post op bleeding, (N/A)  Subjective: Looks good and feels well.  Hungry.  Wants to drink a soda.  Slept well.  Denies SOB  Objective: Vital signs: BP Readings from Last 1 Encounters:  07/17/16 134/77   Pulse Readings from Last 1 Encounters:  07/17/16 65   Resp Readings from Last 1 Encounters:  07/17/16 (!) 25   Temp Readings from Last 1 Encounters:  07/17/16 98.3 F (36.8 C) (Oral)    Hemodynamics:  mixed venous co-ox 51.6%  Physical Exam:  Rhythm:   Aflutter w/ slow ventricular rate - currently VVI pacing  Breath sounds: clear  Heart sounds:  RRR  Incisions:  Clean and dry  Abdomen:  Soft, non-distended, non-tender, + BM yesterday  Extremities:  Warm, well-perfused  Chest tubes:  Low volume thin serosanguinous output, no air leak    Intake/Output from previous day: 10/15 0701 - 10/16 0700 In: 836.7 [I.V.:736.7; IV Piggyback:100] Out: 1280 [Urine:1050; Chest Tube:230] Intake/Output this shift: No intake/output data recorded.  Lab Results:  CBC: Recent Labs  07/16/16 0300 07/17/16 0335  WBC 10.9* 9.8  HGB 10.1* 9.7*  HCT 30.6* 30.1*  PLT 127* 152    BMET:  Recent Labs  07/16/16 0300 07/17/16 0335  NA 141 142  K 3.0* 3.3*  CL 105 107  CO2 27 28  GLUCOSE 146* 142*  BUN 21* 22*  CREATININE 1.32* 1.29*  CALCIUM 8.2* 8.2*     PT/INR:   Recent Labs  07/16/16 0300  LABPROT 16.4*  INR 1.31    CBG (last 3)   Recent Labs  07/16/16 2053 07/17/16 0020 07/17/16 0331    GLUCAP 142* 129* 134*    ABG    Component Value Date/Time   PHART 7.373 07/12/2016 1531   PCO2ART 40.1 07/12/2016 1531   PO2ART 67.0 (L) 07/12/2016 1531   HCO3 23.3 07/12/2016 1531   TCO2 25 07/12/2016 1531   ACIDBASEDEF 2.0 07/12/2016 1531   O2SAT 51.6 07/17/2016 0340    CXR: n/a  Assessment/Plan:  Overall stable POD6 Remains in Aflutter w/ slow ventricular rate Stable BP but mixed venous co-ox down to 51.6% still on milrinone 0.125 mcg/kg/min Breathing comfortably w/ O2 sats 95-99% on RA Acute on chronic combined systolic and diastolic CHF w/ expected post op volume excess I/O's negative 443 mL yesterday but weight still 18 lbs above preop Expected post op acute blood loss anemia, mild, Hgb stable 9.7this morning Expected post op atelectasis, mild Post op thrombocytopenia, resolved Type II diabetes mellitus, CBG's well controlled Patient remains NPO with no expected clinical reasons for swallowing dysfunction - normal esophageal contrast study yesterday   Will increase pacer rate and increase milrinone - recheck co-ox later today  Start heparin drip  Ask for consult from advanced heart failure team  Consider TEE/DCCV  Follow up with SLP team - should be able to start oral diet and begin oral medications  D/C chest tubes  Mobilize  Diuresis   Rexene Alberts, MD 07/17/2016  8:08 AM

## 2016-07-17 NOTE — Progress Notes (Signed)
      [address]       [place] [address]             [phone]      POD # 6 redo CABG, MVR, takeback for bleeding  Up in chair  C/o feeling tired  Ate a little for dinner, appetite poor  Remo Lipps C. Roxan Hockey, MD [facility] [phone]

## 2016-07-17 NOTE — Progress Notes (Addendum)
Initial Nutrition Assessment  DOCUMENTATION CODES:   Not applicable  INTERVENTION:    Ensure Enlive po BID, each supplement provides 350 kcal and 20 grams of protein  NUTRITION DIAGNOSIS:   Increased nutrient needs related to  (post-op healing) as evidenced by estimated needs  GOAL:   Patient will meet greater than or equal to 90% of their needs  MONITOR:   PO intake, Supplement acceptance, Labs, Weight trends, Skin, I & O's  REASON FOR ASSESSMENT:   Consult New TPN/TNA  ASSESSMENT:   70 yo Male with severe ischemic cardiomyopathy, chronic combined systolic and diastolic congestive heart failure, severe mitral regurgitation, hypertension, type 2 diabetes mellitus, and hyperlipidemia who is s/p redo coronary artery bypass grafting and mitral valve replacement.  Patient s/p procedures 10/10: REDO CORONARY ARTERY BYPASS GRAFTING x 2 MITRAL VALVE REPLACEMENT   S/p FEES 10/12 >> SLP recommending NPO status at that time. Unsuccessful placement of CORTRAK small bore feeding tube 10/14. Pharmacy consulted for TPN >> initiated 10/15 PM >> to D/C. Speech Path advanced pt to Full Liquids this AM. Will add oral nutrition supplements to help maximize kcal, protein intake.  RD unable to complete Nutrition Focused Physical Exam at this time >> pt's wife getting ready to bathe pt at time of visit.  Diet Order:  .TPN (CLINIMIX-E) Adult Diet full liquid Room service appropriate? Yes; Fluid consistency: Thin  Skin:  Reviewed, no issues  Last BM:  10/16  Height:   Ht Readings from Last 1 Encounters:  07/14/16 6' (1.829 m)    Weight:   Wt Readings from Last 1 Encounters:  07/17/16 196 lb 13.9 oz (89.3 kg)    Ideal Body Weight:  81 kg  BMI:  Body mass index is 26.7 kg/m.  Estimated Nutritional Needs:   Kcal:  2200-2400  Protein:  120-130 gm  Fluid:  2.2-2.4 L  EDUCATION NEEDS:   No education needs identified at this time  Arthur Holms, RD, LDN Pager #:  [phone] After-Hours Pager #: [phone]

## 2016-07-17 NOTE — Care Management Important Message (Signed)
Important Message  Patient Details  Name: Christian Bowman MRN: [id number] Date of Birth: [birth year]   Medicare Important Message Given:  Yes    [name] Abena 07/17/2016, 1:28 PM

## 2016-07-18 ENCOUNTER — Inpatient Hospital Stay (HOSPITAL_COMMUNITY): Payer: Medicare Other

## 2016-07-18 LAB — BASIC METABOLIC PANEL
ANION GAP: 7 (ref 5–15)
BUN: 16 mg/dL (ref 6–20)
CALCIUM: 7.8 mg/dL — AB (ref 8.9–10.3)
CHLORIDE: 104 mmol/L (ref 101–111)
CO2: 29 mmol/L (ref 22–32)
Creatinine, Ser: 1.25 mg/dL — ABNORMAL HIGH (ref 0.61–1.24)
GFR calc non Af Amer: 57 mL/min — ABNORMAL LOW (ref >60)
GLUCOSE: 73 mg/dL (ref 65–99)
Potassium: 3.1 mmol/L — ABNORMAL LOW (ref 3.5–5.1)
Sodium: 140 mmol/L (ref 135–145)

## 2016-07-18 LAB — GLUCOSE, CAPILLARY
GLUCOSE-CAPILLARY: 142 mg/dL — AB (ref 65–99)
Glucose-Capillary: 82 mg/dL (ref 65–99)
Glucose-Capillary: 121 mg/dL — ABNORMAL HIGH (ref 65–99)
Glucose-Capillary: 154 mg/dL — ABNORMAL HIGH (ref 65–99)
Glucose-Capillary: 169 mg/dL — ABNORMAL HIGH (ref 65–99)

## 2016-07-18 LAB — HEPARIN LEVEL (UNFRACTIONATED)
HEPARIN UNFRACTIONATED: 0.15 [IU]/mL — AB (ref 0.30–0.70)
Heparin Unfractionated: <0.1 IU/mL — ABNORMAL LOW (ref 0.30–0.70)
Heparin Unfractionated: 0.1 IU/mL — ABNORMAL LOW (ref 0.30–0.70)

## 2016-07-18 LAB — CBC
HEMATOCRIT: 30.3 % — AB (ref 39.0–52.0)
Hemoglobin: 10.1 g/dL — ABNORMAL LOW (ref 13.0–17.0)
MCH: 28.6 pg (ref 26.0–34.0)
MCHC: 33.3 g/dL (ref 30.0–36.0)
MCV: 85.8 fL (ref 78.0–100.0)
Platelets: 191 10*3/uL (ref 150–400)
RBC: 3.53 MIL/uL — AB (ref 4.22–5.81)
RDW: 14.3 % (ref 11.5–15.5)
WBC: 11.4 10*3/uL — AB (ref 4.0–10.5)

## 2016-07-18 LAB — COOXEMETRY PANEL
CARBOXYHEMOGLOBIN: 1.7 % — AB (ref 0.5–1.5)
Methemoglobin: 0.9 % (ref 0.0–1.5)
O2 SAT: 59.3 %
TOTAL HEMOGLOBIN: 9.9 g/dL — AB (ref 12.0–16.0)

## 2016-07-18 MED ORDER — POTASSIUM CHLORIDE 10 MEQ/50ML IV SOLN
10.0000 meq | INTRAVENOUS | Status: AC
  Administered 2016-07-18 (×3): 10 meq via INTRAVENOUS
  Filled 2016-07-18 (×4): qty 50

## 2016-07-18 MED ORDER — HEPARIN (PORCINE) IN NACL 100-0.45 UNIT/ML-% IJ SOLN
2050.0000 [IU]/h | INTRAMUSCULAR | Status: DC
  Administered 2016-07-19 (×2): 1850 [IU]/h via INTRAVENOUS
  Administered 2016-07-21: 1900 [IU]/h via INTRAVENOUS
  Administered 2016-07-21 – 2016-07-24 (×7): 2050 [IU]/h via INTRAVENOUS
  Filled 2016-07-18 (×12): qty 250

## 2016-07-18 MED ORDER — LOSARTAN POTASSIUM 25 MG PO TABS
25.0000 mg | ORAL_TABLET | Freq: Every day | ORAL | Status: DC
  Administered 2016-07-19: 25 mg via ORAL
  Filled 2016-07-18 (×2): qty 1

## 2016-07-18 MED ORDER — ORAL CARE MOUTH RINSE
15.0000 mL | Freq: Two times a day (BID) | OROMUCOSAL | Status: DC
  Administered 2016-07-18: 15 mL via OROMUCOSAL

## 2016-07-18 MED ORDER — INSULIN ASPART 100 UNIT/ML ~~LOC~~ SOLN
0.0000 [IU] | Freq: Three times a day (TID) | SUBCUTANEOUS | Status: DC
  Administered 2016-07-18: 2 [IU] via SUBCUTANEOUS
  Administered 2016-07-18 (×2): 3 [IU] via SUBCUTANEOUS
  Administered 2016-07-19: 2 [IU] via SUBCUTANEOUS
  Administered 2016-07-19: 3 [IU] via SUBCUTANEOUS
  Administered 2016-07-20: 2 [IU] via SUBCUTANEOUS
  Administered 2016-07-20: 5 [IU] via SUBCUTANEOUS
  Administered 2016-07-20 – 2016-07-21 (×3): 3 [IU] via SUBCUTANEOUS
  Administered 2016-07-21: 5 [IU] via SUBCUTANEOUS
  Administered 2016-07-22: 2 [IU] via SUBCUTANEOUS
  Administered 2016-07-22 – 2016-07-23 (×5): 3 [IU] via SUBCUTANEOUS
  Administered 2016-07-24 (×2): 2 [IU] via SUBCUTANEOUS
  Administered 2016-07-24: 5 [IU] via SUBCUTANEOUS
  Administered 2016-07-25: 3 [IU] via SUBCUTANEOUS
  Administered 2016-07-26: 5 [IU] via SUBCUTANEOUS
  Administered 2016-07-26 – 2016-07-27 (×3): 3 [IU] via SUBCUTANEOUS
  Administered 2016-07-27: 5 [IU] via SUBCUTANEOUS
  Administered 2016-07-28: 2 [IU] via SUBCUTANEOUS
  Administered 2016-07-28: 5 [IU] via SUBCUTANEOUS
  Administered 2016-07-28 – 2016-07-29 (×2): 3 [IU] via SUBCUTANEOUS
  Administered 2016-07-29 – 2016-07-30 (×3): 2 [IU] via SUBCUTANEOUS
  Administered 2016-07-30: 3 [IU] via SUBCUTANEOUS

## 2016-07-18 MED ORDER — POTASSIUM CHLORIDE 20 MEQ/15ML (10%) PO SOLN
40.0000 meq | Freq: Two times a day (BID) | ORAL | Status: DC
Start: 2016-07-18 — End: 2016-07-19
  Administered 2016-07-18 (×2): 40 meq via ORAL
  Filled 2016-07-18 (×3): qty 30

## 2016-07-18 MED ORDER — POTASSIUM CHLORIDE 10 MEQ/50ML IV SOLN
10.0000 meq | INTRAVENOUS | Status: AC
  Administered 2016-07-18 (×3): 10 meq via INTRAVENOUS
  Filled 2016-07-18 (×2): qty 50

## 2016-07-18 MED ORDER — SPIRONOLACTONE 25 MG PO TABS
25.0000 mg | ORAL_TABLET | Freq: Every day | ORAL | Status: DC
  Administered 2016-07-19 – 2016-08-01 (×13): 25 mg via ORAL
  Filled 2016-07-18 (×13): qty 1

## 2016-07-18 MED ORDER — WARFARIN SODIUM 5 MG PO TABS
5.0000 mg | ORAL_TABLET | Freq: Every day | ORAL | Status: DC
  Administered 2016-07-18 – 2016-07-20 (×3): 5 mg via ORAL
  Filled 2016-07-18 (×3): qty 1

## 2016-07-18 NOTE — Progress Notes (Addendum)
      [address]       [place], [address]             [phone]        CARDIOTHORACIC SURGERY PROGRESS NOTE  R7 DaysPost-OpS/P Procedure(s) (LRB): REDO CORONARY ARTERY BYPASS GRAFTING (CABG) x two using left leg greater saphenous vein harvested endoscopically-SVG to PDA -SVG to RAMUS INTERMEDIATE (N/A) TRANSESOPHAGEAL ECHOCARDIOGRAM (TEE) (N/A) MITRAL VALVE (MV) REPLACEMENT (N/A)   R7 Days Post-Op Procedure(s) (LRB): Re-exploration (CABG) for post op bleeding, (N/A)  Subjective: Looks good.  Had a good night.  No SOB  Objective: Vital signs: BP Readings from Last 1 Encounters:  07/18/16 131/78   Pulse Readings from Last 1 Encounters:  07/18/16 80   Resp Readings from Last 1 Encounters:  07/18/16 (!) 22   Temp Readings from Last 1 Encounters:  07/18/16 97.7 F (36.5 C) (Oral)    Hemodynamics: CVP:  [4 mmHg-10 mmHg] 4 mmHg  Mixed venous co-ox 59.3%  Physical Exam:  Rhythm:   Aflutter w/ HR 60 - VVI pacing at 80  Breath sounds: clear  Heart sounds:  RRR  Incisions:  Clean and dry  Abdomen:  Soft, non-distended, non-tender  Extremities:  Warm, well-perfused    Intake/Output from previous day: 10/16 0701 - 10/17 0700 In: 2494.3 [P.O.:1760; I.V.:484.3; IV Piggyback:250] Out: 2700 [Urine:2700] Intake/Output this shift: No intake/output data recorded.  Lab Results:  CBC: Recent Labs  07/17/16 0335 07/18/16 0227  WBC 9.8 11.4*  HGB 9.7* 10.1*  HCT 30.1* 30.3*  PLT 152 191    BMET:  Recent Labs  07/17/16 1733 07/18/16 0227  NA 139 140  K 3.4* 3.1*  CL 103 104  CO2 29 29  GLUCOSE 147* 73  BUN 20 16  CREATININE 1.25* 1.25*  CALCIUM 7.9* 7.8*     PT/INR:   Recent Labs  07/16/16 0300  LABPROT 16.4*  INR 1.31    CBG (last 3)   Recent Labs  07/17/16 2116 07/17/16 2342 07/18/16 0427  GLUCAP 118* 140* 82    ABG    Component Value Date/Time   PHART 7.373 07/12/2016 1531   PCO2ART 40.1 07/12/2016 1531   PO2ART 67.0 (L) 07/12/2016 1531   HCO3 23.3 07/12/2016 1531   TCO2 25 07/12/2016 1531   ACIDBASEDEF 2.0 07/12/2016 1531   O2SAT 59.3 07/18/2016 0420    CXR: Clear - looks remarkably good  Assessment/Plan:  Overall stable POD7 Remains in Aflutter w/ improved ventricular rate Stable BP with low CVP, mixed venous co-oxup to 59.3% on milrinone 0.25 mcg/kg/min and VVI pacing at 80 Breathing comfortably w/ O2 sats 97-99% on RA, CXR looks clear Acute on chronic combined systolic and diastolic CHF w/ expected post op volume excess I/O's negative 206 mL yesterday but weight still 17 lbs above preop Expected post op acute blood loss anemia, mild, Hgb stable 10.1this morning Expected post op atelectasis, mild Type II diabetes mellitus, CBG's well controlled Hypokalemia - diuretic induced   Continue heparin/Coumadin  Mobilize  Diuresis  Supplement potassium  Change CBG's and SSI to ac/hs  Defer management of CHF to AHF team:  consider TEE/DCCV and/or increasing ARB and weaning milrinone  Rexene Alberts, MD 07/18/2016 7:32 AM

## 2016-07-18 NOTE — Progress Notes (Signed)
Speech Language Pathology Treatment: Dysphagia  Patient Details Name: Christian Bowman MRN: [id number] DOB: September 13, 1946 Today's Date: 07/18/2016 Time: [id number] SLP Time Calculation (min) (ACUTE ONLY): 12 min  Assessment / Plan / Recommendation Clinical Impression  Pt reports minimal coughing during meals since advancement to full liquid diet on previous date. No overt signs of aspiration are noted with thin liquids and pureed solids. SLP provided Min cues for use of liquid wash after every few bites, with throat clearing noted. Recommend advancement to Dys 2 diet, continuing thin liquids, with use of intermittent volitional throat clear and liquid washes to facilitate airway protection and pharyngeal clearance. Will continue to follow and advance.   HPI HPI: Patient is a 70 year old male who underwent redo coronary artery bypass grafting 2 and mitral valve replacement on 10/10.  Bleeding noted after surgery, returned for surgical reexploration. Extubated 10/11. Noted to have some coughing with liquids that evening.       SLP Plan  Continue with current plan of care     Recommendations  Diet recommendations: Dysphagia 2 (fine chop);Thin liquid Liquids provided via: Cup;No straw Medication Administration: Crushed with puree Supervision: Patient able to self feed;Full supervision/cueing for compensatory strategies Compensations: Slow rate;Small sips/bites;Multiple dry swallows after each bite/sip;Follow solids with liquid;Clear throat intermittently Postural Changes and/or Swallow Maneuvers: Seated upright 90 degrees;Upright 30-60 min after meal                Oral Care Recommendations: Oral care BID Follow up Recommendations: Inpatient Rehab Plan: Continue with current plan of care       GO                Christian Bowman 07/18/2016, 2:14 PM  Christian Bowman, M.A. CCC-SLP [phone]

## 2016-07-18 NOTE — Progress Notes (Signed)
Advanced Heart Failure Rounding Note   Subjective:   S/P CABG MVR   Remains on milrinone 0.25 mcg. Todays CO-OX 59%. Yesterday losartan and spiro added.    Denies SOB/CP.   Objective:   Weight Range:  Vital Signs:   Temp:  [97.6 F (36.4 C)-98.3 F (36.8 C)] 98.3 F (36.8 C) (10/17 0834) Pulse Rate:  [60-83] 80 (10/17 0800) Resp:  [20-29] 27 (10/17 0800) BP: (116-149)/(59-89) 121/69 (10/17 0800) SpO2:  [94 %-100 %] 96 % (10/17 0800) Weight:  [88.9 kg (195 lb 15.8 oz)] 88.9 kg (195 lb 15.8 oz) (10/17 0500) Last BM Date: 07/17/16  Weight change: Filed Weights   07/16/16 0300 07/17/16 0430 07/18/16 0500  Weight: 89.3 kg (196 lb 13.9 oz) 89.3 kg (196 lb 13.9 oz) 88.9 kg (195 lb 15.8 oz)    Intake/Output:   Intake/Output Summary (Last 24 hours) at 07/18/16 0915 Last data filed at 07/18/16 0700  Gross per 24 hour  Intake          2356.21 ml  Output             2350 ml  Net             6.21 ml    Physical Exam: CVP 7-8  General:  Well appearing. No resp difficulty. In the chair. HEENT: normal except R neck cyst.  Neck: supple. JVP 7-8  . Carotids 2+ bilat; no bruits. No lymphadenopathy or thryomegaly appreciated. Cor: PMI nondisplaced. Regular rate & rhythm. No rubs, gallops or murmurs.Sternal Incision Ecchymotic. Approximated.  Lungs: clear Abdomen: soft, nontender, nondistended. No hepatosplenomegaly. No bruits or masses. Good bowel sounds. Extremities: no cyanosis, clubbing, rash, R and LLE trace edema Neuro: alert & orientedx3, cranial nerves grossly intact. moves all 4 extremities w/o difficulty. Affect pleasant  Telemetry:  A flutter 80   Labs: Basic Metabolic Panel:  Recent Labs Lab 07/12/16 0500 07/12/16 1500  07/15/16 0425 07/16/16 0300 07/17/16 0335 07/17/16 1733 07/18/16 0227  NA 139  --   < > 142 141 142 139 140  K 3.5  --   < > 2.9* 3.0* 3.3* 3.4* 3.1*  CL 109  --   < > 105 105 107 103 104  CO2 23  --   < > 29 27 28 29 29   GLUCOSE  130*  --   < > 122* 146* 142* 147* 73  BUN 17  --   < > 17 21* 22* 20 16  CREATININE 1.01 1.16  < > 1.33* 1.32* 1.29* 1.25* 1.25*  CALCIUM 7.7*  --   < > 8.1* 8.2* 8.2* 7.9* 7.8*  MG 2.5* 2.2  --   --  2.0 2.1  --   --   PHOS  --   --   --   --  2.8 2.9  --   --   < > = values in this interval not displayed.  Liver Function Tests:  Recent Labs Lab 07/16/16 0300 07/17/16 0335  AST 33 48*  ALT 29 36  ALKPHOS 51 48  BILITOT 1.7* 1.8*  PROT 5.2* 5.0*  ALBUMIN 2.5* 2.4*   No results for input(s): LIPASE, AMYLASE in the last 168 hours. No results for input(s): AMMONIA in the last 168 hours.  CBC:  Recent Labs Lab 07/11/16 1542  07/14/16 0345 07/15/16 0425 07/16/16 0300 07/17/16 0335 07/18/16 0227  WBC 12.5*  < > 12.7* 13.0* 10.9* 9.8 11.4*  NEUTROABS 11.0*  --   --   --   --  7.0  --   HGB 9.2*  < > 9.4* 9.8* 10.1* 9.7* 10.1*  HCT 27.5*  < > 27.9* 29.3* 30.6* 30.1* 30.3*  MCV 84.6  < > 85.3 85.4 85.7 86.5 85.8  PLT 94*  < > 83* 92* 127* 152 191  < > = values in this interval not displayed.  Cardiac Enzymes: No results for input(s): CKTOTAL, CKMB, CKMBINDEX, TROPONINI in the last 168 hours.  BNP: BNP (last 3 results) No results for input(s): BNP in the last 8760 hours.  ProBNP (last 3 results) No results for input(s): PROBNP in the last 8760 hours.    Other results:  Imaging: Dg Esophagus  Result Date: 07/16/2016 CLINICAL DATA:  Assess for esophageal motility or injury after surgery. Difficulty swallowing. EXAM: ESOPHOGRAM/BARIUM SWALLOW TECHNIQUE: Single contrast examination was performed using  Isovue. FLUOROSCOPY TIME:  Fluoroscopy Time:  2 minutes and 48 seconds Number of Acquired Spot Images: 0, multiple saved images were obtained. COMPARISON:  None. FINDINGS: The esophagus is normal in caliber along its length with no evidence of stricture or obstruction. A barium tablet was not utilized. No evidence of leak. Limited views of the stomach and proximal  duodenum are normal. IMPRESSION: 1. No evidence of esophageal injury or dysmotility. Electronically Signed   By: Dorise Bullion III M.D   On: 07/16/2016 16:17   Dg Chest Port 1 View  Result Date: 07/18/2016 CLINICAL DATA:  Shortness of breath, history of cardiac surgery EXAM: PORTABLE CHEST 1 VIEW COMPARISON:  Portable chest x-ray of 07/17/2016 FINDINGS: The lungs appear better aerated. No pneumonia or effusion is seen and no pneumothorax is evident. A right PICC line remains with the tip overlying the lower SVC. Cardiomegaly is stable. Median sternotomy sutures are noted. Bilateral chest tubes have been a IMPRESSION: 1. Bilateral chest tubes removed. No pneumothorax. The lungs are clear. 2. Stable cardiomegaly. 3. No change in position of right PICC line. Electronically Signed   By: Ivar Drape M.D.   On: 07/18/2016 08:33   Dg Chest Port 1 View  Result Date: 07/17/2016 CLINICAL DATA:  Chest tube. EXAM: PORTABLE CHEST 1 VIEW COMPARISON:  Multiple recent previous exams. FINDINGS: Bilateral chest tubes remain in place. No evidence for pneumothorax on either side. Right PICC line tip overlies the mid SVC. The cardio pericardial silhouette is enlarged. No pulmonary edema or focal airspace consolidation. No substantial pleural effusion. Basilar atelectasis seen previously has decreased in the interval. IMPRESSION: Persistent bilateral chest tubes without evidence for pneumothorax. Head Electronically Signed   By: Misty Stanley M.D.   On: 07/17/2016 09:00      Medications:     Scheduled Medications: . bisacodyl  10 mg Oral Daily   Or  . bisacodyl  10 mg Rectal Daily  . feeding supplement (ENSURE ENLIVE)  237 mL Oral BID BM  . furosemide  40 mg Intravenous Q8H  . insulin aspart  0-15 Units Subcutaneous TID WC  . losartan  12.5 mg Oral Daily  . mouth rinse  15 mL Mouth Rinse q12n4p  . potassium chloride  10 mEq Intravenous Q1 Hr x 3  . potassium chloride  10 mEq Intravenous Q1 Hr x 3  .  potassium chloride  40 mEq Oral BID  . rosuvastatin  10 mg Oral QHS  . spironolactone  12.5 mg Oral Daily  . warfarin  5 mg Oral q1800  . Warfarin - Physician Dosing Inpatient   Does not apply q1800     Infusions: . sodium chloride    .  heparin 1,350 Units/hr (07/18/16 0738)  . milrinone 0.25 mcg/kg/min (07/18/16 0738)     PRN Medications:  fentaNYL (SUBLIMAZE) injection, Influenza vac split quadrivalent PF, metoprolol, ondansetron (ZOFRAN) IV, sodium chloride flush, sodium chloride flush   Assessment/Plan/Discussion   1. CAD: s/p redo CABG with MVR.  Restarting statin.  2. Acute on chronic systolic CHF: Ischemic cardiomyopathy.  Pre-op EF 35-40%.  Low co-ox suggestive of low output post-op, milrinone increased to 0.25 mcg. Today CO-OX 59%.  Volume status improved.  CVP ~7.  Renal function stable. - Can decrease milrinone to 0.125 mcg/kg/min.  - Continue lasix 40 mg IV every 8 hrs today, probably transition to po tomorrow.  - Add ted hose.  - Increase losartan to 25 mg daily and increase spiro to 25 mg daily.  - Will reassess EF and valve on TEE tomorrow.  3. Bradycardia: Underlying slow atrial flutter.  Currently RV-pacing.  Plan cardioversion tomorrow.  If remains bradycardic, may need CRT.  4. Atrial flutter: Post-op.  Clear flutterr waves on telemetry.  Had post-op atrial fibrillation after 1st CABG.  Amiodarone stopped because of bradycardia.  - Continue heparin gtt and start warfarin tonight.  - Plan for TEE-guided DCCV Wednesday if remains in flutter.   5. S/p bioprosthetic MVR: Will need eventual post-op echo.   Length of Stay: [place] NP-C  07/18/2016, 9:15 AM  Advanced Heart Failure Team Pager [phone] (M-F; [place])  Please contact [facility] for night-coverage after hours (4p -7a ) and weekends on [email or web address]  Patient seen with NP, agree with the above note.  He is doing well today, volume looks good.   - 1 more day IV Lasix, probably to po tomorrow.    - Wean down milrinone to 0.125 and increase losartan to 25 + spironolactone to 25. - No beta blocker with bradycardia, low output/milrinone.   - Plan TEE-guided DCCV tomorrow as he remains in flutter.  On heparin overlapping warfarin.  - Will follow HR, may need CRT HR does not recover.   Loralie Champagne 07/18/2016 1:19 PM

## 2016-07-18 NOTE — Progress Notes (Signed)
ANTICOAGULATION CONSULT NOTE - Follow Up Consult  Pharmacy Consult for Heparin Indication: tissue MVR / aflutter  Allergies  Allergen Reactions  . Xanax [Alprazolam] Other (See Comments)    Pt feels very weak, tired and feels paralyzed      Patient Measurements: Height: 6' (182.9 cm) Weight: 195 lb 15.8 oz (88.9 kg) IBW/kg (Calculated) : 77.6   Vital Signs: Temp: 97.4 F (36.3 C) (10/17 1232) Temp Source: Oral (10/17 1232) BP: 114/73 (10/17 1300) Pulse Rate: 79 (10/17 1300)  Labs:  Recent Labs  07/16/16 0300 07/17/16 0335 07/17/16 1733 07/17/16 2133 07/18/16 0227 07/18/16 1343  HGB 10.1* 9.7*  --   --  10.1*  --   HCT 30.6* 30.1*  --   --  30.3*  --   PLT 127* 152  --   --  191  --   LABPROT 16.4*  --   --   --   --   --   INR 1.31  --   --   --   --   --   HEPARINUNFRC  --   --   --  <0.10* <0.10* 0.15*  CREATININE 1.32* 1.29* 1.25*  --  1.25*  --     Estimated Creatinine Clearance: 62.1 mL/min (by C-G formula based on SCr of 1.25 mg/dL (H)).  Medications: Heparin @ 1350 units/hr  Assessment: 70yom continues on heparin for tissue MVR and aflutter. Heparin level remains low at 0.15 despite rate increase. No issues with infusion per RN. CBC stable. No bleeding. Coumadin to resume tonight per MD.  Goal of Therapy:  Heparin level 0.3-0.5 units/ml Monitor platelets by anticoagulation protocol: Yes   Plan:  1) Increase heparin to 1600 units/hr 2) Check 6 hour heparin level  Nena Jordan, PharmD, BCPS 07/18/2016,2:46 PM

## 2016-07-18 NOTE — Progress Notes (Signed)
ANTICOAGULATION CONSULT NOTE - Follow Up Consult  Pharmacy Consult for Heparin Indication: tissue MVR / aflutter  Allergies  Allergen Reactions  . Xanax [Alprazolam] Other (See Comments)    Pt feels very weak, tired and feels paralyzed      Patient Measurements: Height: 6' (182.9 cm) Weight: 195 lb 15.8 oz (88.9 kg) IBW/kg (Calculated) : 77.6   Vital Signs: Temp: 98 F (36.7 C) (10/17 1957) Temp Source: Oral (10/17 1957) BP: 131/79 (10/17 2200) Pulse Rate: 82 (10/17 2200)  Labs:  Recent Labs  07/16/16 0300 07/17/16 0335 07/17/16 1733  07/18/16 0227 07/18/16 1343 07/18/16 2051  HGB 10.1* 9.7*  --   --  10.1*  --   --   HCT 30.6* 30.1*  --   --  30.3*  --   --   PLT 127* 152  --   --  191  --   --   LABPROT 16.4*  --   --   --   --   --   --   INR 1.31  --   --   --   --   --   --   HEPARINUNFRC  --   --   --   < > <0.10* 0.15* 0.10*  CREATININE 1.32* 1.29* 1.25*  --  1.25*  --   --   < > = values in this interval not displayed.  Estimated Creatinine Clearance: 62.1 mL/min (by C-G formula based on SCr of 1.25 mg/dL (H)).  Medications: Heparin @ 1600 units/hr  Assessment: 70yom continues on heparin for tissue MVR and aflutter. Heparin level remains low at 0.1  Goal of Therapy:  Heparin level 0.3-0.5 units/ml Monitor platelets by anticoagulation protocol: Yes   Plan:  -Increase heparin to 1850 units/hr -Heparin level in 8 hours and daily wth CBC daily  Hildred Laser, Pharm D 07/18/2016 10:07 PM

## 2016-07-18 NOTE — Progress Notes (Signed)
[place] for Heparin Indication:  aflutter  Allergies  Allergen Reactions  . Xanax [Alprazolam] Other (See Comments)    Pt feels very weak, tired and feels paralyzed      Patient Measurements: Height: 6' (182.9 cm) Weight: 196 lb 13.9 oz (89.3 kg) IBW/kg (Calculated) : 77.6   Vital Signs: Temp: 97.8 F (36.6 C) (10/16 2346) Temp Source: Oral (10/16 2346) BP: 119/67 (10/17 0200) Pulse Rate: 60 (10/17 0300)  Labs:  Recent Labs  07/15/16 0425 07/16/16 0300 07/17/16 0335 07/17/16 1733 07/17/16 2133 07/18/16 0227  HGB 9.8* 10.1* 9.7*  --   --  10.1*  HCT 29.3* 30.6* 30.1*  --   --  30.3*  PLT 92* 127* 152  --   --  191  LABPROT 14.5 16.4*  --   --   --   --   INR 1.13 1.31  --   --   --   --   HEPARINUNFRC  --   --   --   --  <0.10* <0.10*  CREATININE 1.33* 1.32* 1.29* 1.25*  --   --     Estimated Creatinine Clearance: 62.1 mL/min (by C-G formula based on SCr of 1.25 mg/dL (H)).  Assessment: 70 yo male s/p bioprosthetic MVR 10/10, now with Aflutter, for heparin   Goal of Therapy:  Heparin level 0.3-0.5 units/ml Monitor platelets by anticoagulation protocol: Yes   Plan:  Increase Heparin  1350 units/hr Check heparin level in 8 hours.   Phillis Knack, PharmD, BCPS 07/18/2016,3:22 AM

## 2016-07-19 ENCOUNTER — Inpatient Hospital Stay (HOSPITAL_COMMUNITY): Payer: Medicare Other | Admitting: Anesthesiology

## 2016-07-19 ENCOUNTER — Encounter (HOSPITAL_COMMUNITY): Payer: Self-pay | Admitting: *Deleted

## 2016-07-19 ENCOUNTER — Inpatient Hospital Stay (HOSPITAL_COMMUNITY): Payer: Medicare Other

## 2016-07-19 ENCOUNTER — Encounter (HOSPITAL_COMMUNITY)
Admission: RE | Disposition: A | Discharge status: Home / Self Care | Payer: Self-pay | Source: Ambulatory Visit | Attending: Thoracic Surgery (Cardiothoracic Vascular Surgery)

## 2016-07-19 DIAGNOSIS — I25700 Atherosclerosis of coronary artery bypass graft(s), unspecified, with unstable angina pectoris: Secondary | ICD-10-CM

## 2016-07-19 DIAGNOSIS — I4892 Unspecified atrial flutter: Secondary | ICD-10-CM

## 2016-07-19 HISTORY — PX: CARDIOVERSION: SHX1299

## 2016-07-19 HISTORY — PX: TEE WITHOUT CARDIOVERSION: SHX5443

## 2016-07-19 LAB — GLUCOSE, CAPILLARY
GLUCOSE-CAPILLARY: 168 mg/dL — AB (ref 65–99)
Glucose-Capillary: 144 mg/dL — ABNORMAL HIGH (ref 65–99)
Glucose-Capillary: 92 mg/dL (ref 65–99)
Glucose-Capillary: 179 mg/dL — ABNORMAL HIGH (ref 65–99)
Glucose-Capillary: 120 mg/dL — ABNORMAL HIGH (ref 65–99)

## 2016-07-19 LAB — COOXEMETRY PANEL
CARBOXYHEMOGLOBIN: 1.8 % — AB (ref 0.5–1.5)
METHEMOGLOBIN: 0.7 % (ref 0.0–1.5)
O2 SAT: 57.6 %
TOTAL HEMOGLOBIN: 10.7 g/dL — AB (ref 12.0–16.0)

## 2016-07-19 LAB — BASIC METABOLIC PANEL
ANION GAP: 7 (ref 5–15)
BUN: 14 mg/dL (ref 6–20)
CALCIUM: 8.2 mg/dL — AB (ref 8.9–10.3)
CO2: 30 mmol/L (ref 22–32)
CREATININE: 1.3 mg/dL — AB (ref 0.61–1.24)
Chloride: 99 mmol/L — ABNORMAL LOW (ref 101–111)
GFR calc Af Amer: >60 mL/min (ref >60)
GFR, EST NON AFRICAN AMERICAN: 55 mL/min — AB (ref >60)
GLUCOSE: 134 mg/dL — AB (ref 65–99)
Potassium: 3.7 mmol/L (ref 3.5–5.1)
Sodium: 136 mmol/L (ref 135–145)

## 2016-07-19 LAB — CBC
HCT: 29.6 % — ABNORMAL LOW (ref 39.0–52.0)
Hemoglobin: 9.6 g/dL — ABNORMAL LOW (ref 13.0–17.0)
MCH: 28.6 pg (ref 26.0–34.0)
MCHC: 32.4 g/dL (ref 30.0–36.0)
MCV: 88.1 fL (ref 78.0–100.0)
PLATELETS: 218 10*3/uL (ref 150–400)
RBC: 3.36 MIL/uL — AB (ref 4.22–5.81)
RDW: 14.6 % (ref 11.5–15.5)
WBC: 11.1 10*3/uL — AB (ref 4.0–10.5)

## 2016-07-19 LAB — PROTIME-INR
INR: 1.23
Prothrombin Time: 15.6 seconds — ABNORMAL HIGH (ref 11.4–15.2)

## 2016-07-19 LAB — HEPARIN LEVEL (UNFRACTIONATED)
HEPARIN UNFRACTIONATED: 0.41 [IU]/mL (ref 0.30–0.70)
Heparin Unfractionated: 0.28 IU/mL — ABNORMAL LOW (ref 0.30–0.70)

## 2016-07-19 SURGERY — CARDIOVERSION
Anesthesia: Monitor Anesthesia Care

## 2016-07-19 MED ORDER — POTASSIUM CHLORIDE 10 MEQ/50ML IV SOLN
10.0000 meq | INTRAVENOUS | Status: AC
  Administered 2016-07-19 (×2): 10 meq via INTRAVENOUS
  Filled 2016-07-19 (×2): qty 50

## 2016-07-19 MED ORDER — FUROSEMIDE 40 MG PO TABS
40.0000 mg | ORAL_TABLET | Freq: Every day | ORAL | Status: DC
  Filled 2016-07-19: qty 1

## 2016-07-19 MED ORDER — SODIUM CHLORIDE 0.9 % IV SOLN
INTRAVENOUS | Status: DC | PRN
  Administered 2016-07-19 (×2): 120 ug via INTRAVENOUS

## 2016-07-19 MED ORDER — POTASSIUM CHLORIDE 20 MEQ/15ML (10%) PO SOLN
40.0000 meq | Freq: Two times a day (BID) | ORAL | Status: DC
  Administered 2016-07-20 – 2016-07-21 (×3): 40 meq via ORAL
  Filled 2016-07-19 (×4): qty 30

## 2016-07-19 MED ORDER — PROPOFOL 500 MG/50ML IV EMUL
INTRAVENOUS | Status: DC | PRN
  Administered 2016-07-19: 65 ug/kg/min via INTRAVENOUS

## 2016-07-19 MED ORDER — POTASSIUM CHLORIDE 10 MEQ/50ML IV SOLN
10.0000 meq | INTRAVENOUS | Status: AC
  Administered 2016-07-19 (×3): 10 meq via INTRAVENOUS
  Filled 2016-07-19 (×3): qty 50

## 2016-07-19 MED ORDER — PROPOFOL 10 MG/ML IV BOLUS
INTRAVENOUS | Status: DC | PRN
  Administered 2016-07-19: 30 mg via INTRAVENOUS

## 2016-07-19 MED ORDER — SODIUM CHLORIDE 0.9 % IV SOLN
INTRAVENOUS | Status: DC
  Administered 2016-07-19: 12:00:00 via INTRAVENOUS

## 2016-07-19 MED ORDER — BUTAMBEN-TETRACAINE-BENZOCAINE 2-2-14 % EX AERO
INHALATION_SPRAY | CUTANEOUS | Status: DC | PRN
  Administered 2016-07-19: 2 via TOPICAL

## 2016-07-19 NOTE — Progress Notes (Signed)
TCTS BRIEF SICU PROGRESS NOTE  Day of Surgery  S/P Procedure(s) (LRB): CARDIOVERSION (N/A) TRANSESOPHAGEAL ECHOCARDIOGRAM (TEE) (N/A)   Patient remains in sinus rhythm w/ complete heart block.  Sinus rate 100-110 and ventricular escape 50-60.    Plan: I reprogrammed temporary pacer to DDD.  Probably needs biventricular pacer-ICD.  Will ask for EPS consult.  Discussed with patient and his wife.  Rexene Alberts, MD 07/19/2016 5:49 PM

## 2016-07-19 NOTE — Progress Notes (Signed)
Patient ID: Christian Bowman, male   DOB: 1945-10-26, 70 y.o.   MRN: [id number] EVENING ROUNDS NOTE :     [address]       [place] [address]             [phone]                 Day of Surgery Procedure(s) (LRB): CARDIOVERSION (N/A) TRANSESOPHAGEAL ECHOCARDIOGRAM (TEE) (N/A)  R8DaysPost-OpS/P Procedure(s) (LRB): REDO CORONARY ARTERY BYPASS GRAFTING (CABG) x two using left leg greater saphenous vein harvested endoscopically-SVG to PDA -SVG to RAMUS INTERMEDIATE (N/A) TRANSESOPHAGEAL ECHOCARDIOGRAM (TEE) (N/A) MITRAL VALVE (MV) REPLACEMENT (N/A)   R8 Days Post-Op Procedure(s) (LRB): Re-exploration (CABG) for post op bleeding, (N/A)   Total Length of Stay:  LOS: 8 days  BP 118/82   Pulse 98   Temp 98.3 F (36.8 C) (Oral)   Resp (!) 23   Ht 6' (1.829 m)   Wt 195 lb 12.3 oz (88.8 kg)   SpO2 98%   BMI 26.55 kg/m   .Intake/Output      10/18 0701 - 10/19 0700   P.O. 780   I.V. (mL/kg) 430.8 (4.9)   IV Piggyback 150   Total Intake(mL/kg) 1360.8 (15.3)   Urine (mL/kg/hr) 900 (0.8)   Stool 0 (0)   Total Output 900   Net +460.8       Stool Occurrence 0 x     . sodium chloride Stopped (07/19/16 1344)  . heparin 1,900 Units/hr (07/19/16 1900)     Lab Results  Component Value Date   WBC 11.1 (H) 07/19/2016   HGB 9.6 (L) 07/19/2016   HCT 29.6 (L) 07/19/2016   PLT 218 07/19/2016   GLUCOSE 134 (H) 07/19/2016   CHOL 95 (L) 04/20/2016   TRIG 99 07/17/2016   HDL 28 (L) 04/20/2016   LDLCALC 45 04/20/2016   ALT 36 07/17/2016   AST 48 (H) 07/17/2016   NA 136 07/19/2016   K 3.7 07/19/2016   CL 99 (L) 07/19/2016   CREATININE 1.30 (H) 07/19/2016   BUN 14 07/19/2016   CO2 30 07/19/2016   TSH 3.729 06/21/2016   INR 1.23 07/19/2016   HGBA1C 6.0 (H) 07/07/2016   Stable , now paced DDD after cardioversion /tee this am  Grace Isaac MD  Beeper [phone] Office [phone] 07/19/2016 7:50 PM

## 2016-07-19 NOTE — CV Procedure (Signed)
See full TEE report in [employer] Pt sedated by anesthesia with diprovan 130 mg IV. No LAA thrombus Pt subsequently had DCCV 120J to sinus rhythm with underlying heart block; ventricular pacing. May need pacemaker Continue heparin/coumadin Kirk Ruths, MD

## 2016-07-19 NOTE — Anesthesia Postprocedure Evaluation (Signed)
Anesthesia Post Note  Patient: Christian Bowman  Procedure(s) Performed: Procedure(s) (LRB): CARDIOVERSION (N/A) TRANSESOPHAGEAL ECHOCARDIOGRAM (TEE) (N/A)  Patient location during evaluation: ICU Anesthesia Type: MAC Level of consciousness: awake and alert Pain management: pain level controlled Vital Signs Assessment: post-procedure vital signs reviewed and stable Respiratory status: spontaneous breathing, nonlabored ventilation, respiratory function stable and patient connected to nasal cannula oxygen Cardiovascular status: stable and blood pressure returned to baseline Anesthetic complications: no    Last Vitals:  Vitals:   07/19/16 1600 07/19/16 1632  BP: 122/67   Pulse: 80   Resp: (!) 22   Temp:  36.7 C    Last Pain:  Vitals:   07/19/16 1632  TempSrc: Oral  PainSc:                  Reginal Lutes

## 2016-07-19 NOTE — Progress Notes (Signed)
Echocardiogram Echocardiogram Transesophageal has been performed.  Christian Bowman 07/19/2016, 1:06 PM

## 2016-07-19 NOTE — Progress Notes (Signed)
ANTICOAGULATION CONSULT NOTE - Follow Up Consult  Pharmacy Consult for Heparin Indication: tissue MVR / aflutter  Allergies  Allergen Reactions  . Xanax [Alprazolam] Other (See Comments)    Pt feels very weak, tired and feels paralyzed      Patient Measurements: Height: 6' (182.9 cm) Weight: 195 lb 12.3 oz (88.8 kg) IBW/kg (Calculated) : 77.6   Vital Signs: Temp: 98 F (36.7 C) (10/18 1632) Temp Source: Oral (10/18 1632) BP: 111/63 (10/18 1700) Pulse Rate: 80 (10/18 1700)  Labs:  Recent Labs  07/17/16 0335 07/17/16 1733  07/18/16 0227  07/18/16 2051 07/19/16 0345 07/19/16 0845 07/19/16 1639  HGB 9.7*  --   --  10.1*  --   --  9.6*  --   --   HCT 30.1*  --   --  30.3*  --   --  29.6*  --   --   PLT 152  --   --  191  --   --  218  --   --   LABPROT  --   --   --   --   --   --  15.6*  --   --   INR  --   --   --   --   --   --  1.23  --   --   HEPARINUNFRC  --   --   < > <0.10*  < > 0.10*  --  0.41 0.28*  CREATININE 1.29* 1.25*  --  1.25*  --   --  1.30*  --   --   < > = values in this interval not displayed.  Estimated Creatinine Clearance: 59.7 mL/min (by C-G formula based on SCr of 1.3 mg/dL (H)).  Medications: Heparin @ 1850 units/hr  Assessment: 70yom continues on heparin for tissue MVR and aflutter. . CBC stable. No bleeding. Coumadin resumed 10/17 per MD. S/p  TEE guided cardioversion today Aflutter> SR. Will plan to keep HL therapeutic  > 3.  Heaprin drip 1850 uts/hr HL fell 0.4>0.28.    Goal of Therapy:  Heparin level 0.3-0.5 units/ml Monitor platelets by anticoagulation protocol: Yes   Plan:  1)Increase heparin at 1900 units/hr 2) Daily heparin level and CBC  Bonnita Nasuti Pharm.D. CPP, BCPS Clinical Pharmacist [phone] 07/19/2016 5:58 PM

## 2016-07-19 NOTE — Progress Notes (Addendum)
ANTICOAGULATION CONSULT NOTE - Follow Up Consult  Pharmacy Consult for Heparin Indication: tissue MVR / aflutter  Allergies  Allergen Reactions  . Xanax [Alprazolam] Other (See Comments)    Pt feels very weak, tired and feels paralyzed      Patient Measurements: Height: 6' (182.9 cm) Weight: 195 lb 12.3 oz (88.8 kg) IBW/kg (Calculated) : 77.6   Vital Signs: Temp: 98.1 F (36.7 C) (10/18 0800) Temp Source: Oral (10/18 0800) BP: 126/77 (10/18 0800) Pulse Rate: 80 (10/18 0800)  Labs:  Recent Labs  07/17/16 0335 07/17/16 1733  07/18/16 0227 07/18/16 1343 07/18/16 2051 07/19/16 0345 07/19/16 0845  HGB 9.7*  --   --  10.1*  --   --  9.6*  --   HCT 30.1*  --   --  30.3*  --   --  29.6*  --   PLT 152  --   --  191  --   --  218  --   LABPROT  --   --   --   --   --   --  15.6*  --   INR  --   --   --   --   --   --  1.23  --   HEPARINUNFRC  --   --   < > <0.10* 0.15* 0.10*  --  0.41  CREATININE 1.29* 1.25*  --  1.25*  --   --  1.30*  --   < > = values in this interval not displayed.  Estimated Creatinine Clearance: 59.7 mL/min (by C-G formula based on SCr of 1.3 mg/dL (H)).  Medications: Heparin @ 1850 units/hr  Assessment: 70yom continues on heparin for tissue MVR and aflutter. Heparin level now therapeutic. CBC stable. No bleeding. Coumadin resumed 10/17 per MD. Plan for TEE guided cardioversion today.  Goal of Therapy:  Heparin level 0.3-0.5 units/ml Monitor platelets by anticoagulation protocol: Yes   Plan:  1) Continue heparin at 1850 units/hr 2) Check 6 hour heparin level to confirm 3) Daily heparin level and CBC  Melburn Popper, PharmD Clinical Pharmacy Resident Pager: [phone] 07/19/16 9:30 AM

## 2016-07-19 NOTE — Anesthesia Procedure Notes (Signed)
Procedure Name: MAC Date/Time: 07/19/2016 12:20 PM Performed by: Trixie Deis A Pre-anesthesia Checklist: Patient identified, Emergency Drugs available, Suction available, Patient being monitored and Timeout performed Oxygen Delivery Method: Nasal cannula Placement Confirmation: positive ETCO2

## 2016-07-19 NOTE — Anesthesia Preprocedure Evaluation (Signed)
Anesthesia Evaluation  Patient identified by MRN, date of birth, ID band Patient unresponsive  Preop documentation limited or incomplete due to emergent nature of procedure.  Airway Mallampati: II  TM Distance: >3 FB Neck ROM: Full    Dental no notable dental hx.    Pulmonary  Intubated.   Pulmonary exam normal  (-) decreased breath sounds      Cardiovascular hypertension, + angina + CAD, + Past MI, + Peripheral Vascular Disease and +CHF  + dysrhythmias Atrial Fibrillation + Valvular Problems/Murmurs (s/p MV replacement)  Rhythm:Irregular Rate:Normal  S/p CABG, MVR on 07/11/16 ( 8 days ago), weaned off milrinone gtt. preop echo 35-40%, now with post-operative a-flutter   Neuro/Psych    GI/Hepatic   Endo/Other  diabetes, Type 2  Renal/GU ARFRenal disease     Musculoskeletal   Abdominal   Peds  Hematology  (+) anemia ,   Anesthesia Other Findings   Reproductive/Obstetrics                             Anesthesia Physical  Anesthesia Plan  ASA: IV  Anesthesia Plan: MAC   Post-op Pain Management:    Induction: Intravenous  Airway Management Planned: Simple Face Mask  Additional Equipment:   Intra-op Plan:   Post-operative Plan:   Informed Consent: I have reviewed the patients History and Physical, chart, labs and discussed the procedure including the risks, benefits and alternatives for the proposed anesthesia with the patient or authorized representative who has indicated his/her understanding and acceptance.   Dental advisory given  Plan Discussed with: CRNA and Anesthesiologist  Anesthesia Plan Comments:         Anesthesia Quick Evaluation

## 2016-07-19 NOTE — Transfer of Care (Signed)
Immediate Anesthesia Transfer of Care Note  Patient: Christian Bowman  Procedure(s) Performed: Procedure(s): CARDIOVERSION (N/A) TRANSESOPHAGEAL ECHOCARDIOGRAM (TEE) (N/A)  Patient Location: SICU  Anesthesia Type:MAC  Level of Consciousness: awake, alert  and oriented  Airway & Oxygen Therapy: Patient Spontanous Breathing and Patient connected to nasal cannula oxygen  Post-op Assessment: Report given to RN, Post -op Vital signs reviewed and stable and Patient moving all extremities  Post vital signs: Reviewed and stable  Last Vitals:  Vitals:   07/19/16 1100 07/19/16 1135  BP: 128/72 137/71  Pulse: 80   Resp: (!) 23 (!) 25  Temp:  36.6 C    Last Pain:  Vitals:   07/19/16 1135  TempSrc: Oral  PainSc:       Patients Stated Pain Goal: 0 ([id number])  Complications: No apparent anesthesia complications

## 2016-07-19 NOTE — Progress Notes (Signed)
Advanced Heart Failure Rounding Note   Subjective:   S/P CABG MVR   Remains on milrinone 0.125 mcg. Todays CO-OX 58%. Yesterday losartan and spiro increased.     Denies SOB/CP.   Objective:   Weight Range:  Vital Signs:   Temp:  [97.4 F (36.3 C)-98.3 F (36.8 C)] 98.1 F (36.7 C) (10/18 0800) Pulse Rate:  [78-82] 80 (10/18 0800) Resp:  [14-28] 20 (10/18 0800) BP: (108-141)/(59-95) 126/77 (10/18 0800) SpO2:  [91 %-100 %] 96 % (10/18 0800) Weight:  [195 lb 12.3 oz (88.8 kg)] 195 lb 12.3 oz (88.8 kg) (10/18 0600) Last BM Date: 07/18/16  Weight change: Filed Weights   07/17/16 0430 07/18/16 0500 07/19/16 0600  Weight: 196 lb 13.9 oz (89.3 kg) 195 lb 15.8 oz (88.9 kg) 195 lb 12.3 oz (88.8 kg)    Intake/Output:   Intake/Output Summary (Last 24 hours) at 07/19/16 0918 Last data filed at 07/19/16 [id number]  Gross per 24 hour  Intake           530.54 ml  Output             1900 ml  Net         -1369.46 ml    Physical Exam: CVP 7-8  General:  Well appearing. No resp difficulty. In the chair. HEENT: normal except R neck cyst.  Neck: supple. JVP 7-8  . Carotids 2+ bilat; no bruits. No lymphadenopathy or thryomegaly appreciated. Cor: PMI nondisplaced. Regular rate & rhythm. No rubs, gallops or murmurs.Sternal Incision Ecchymotic. Approximated.  Lungs: clear Abdomen: soft, nontender, nondistended. No hepatosplenomegaly. No bruits or masses. Good bowel sounds. Extremities: no cyanosis, clubbing, rash, R and LLE trace edema ted hose in place  Neuro: alert & orientedx3, cranial nerves grossly intact. moves all 4 extremities w/o difficulty. Affect pleasant  Telemetry:  A flutter 80   Labs: Basic Metabolic Panel:  Recent Labs Lab 07/12/16 1500  07/16/16 0300 07/17/16 0335 07/17/16 1733 07/18/16 0227 07/19/16 0345  NA  --   < > 141 142 139 140 136  K  --   < > 3.0* 3.3* 3.4* 3.1* 3.7  CL  --   < > 105 107 103 104 99*  CO2  --   < > 27 28 29 29 30   GLUCOSE  --   <  > 146* 142* 147* 73 134*  BUN  --   < > 21* 22* 20 16 14   CREATININE 1.16  < > 1.32* 1.29* 1.25* 1.25* 1.30*  CALCIUM  --   < > 8.2* 8.2* 7.9* 7.8* 8.2*  MG 2.2  --  2.0 2.1  --   --   --   PHOS  --   --  2.8 2.9  --   --   --   < > = values in this interval not displayed.  Liver Function Tests:  Recent Labs Lab 07/16/16 0300 07/17/16 0335  AST 33 48*  ALT 29 36  ALKPHOS 51 48  BILITOT 1.7* 1.8*  PROT 5.2* 5.0*  ALBUMIN 2.5* 2.4*   No results for input(s): LIPASE, AMYLASE in the last 168 hours. No results for input(s): AMMONIA in the last 168 hours.  CBC:  Recent Labs Lab 07/15/16 0425 07/16/16 0300 07/17/16 0335 07/18/16 0227 07/19/16 0345  WBC 13.0* 10.9* 9.8 11.4* 11.1*  NEUTROABS  --   --  7.0  --   --   HGB 9.8* 10.1* 9.7* 10.1* 9.6*  HCT 29.3* 30.6* 30.1*  30.3* 29.6*  MCV 85.4 85.7 86.5 85.8 88.1  PLT 92* 127* 152 191 218    Cardiac Enzymes: No results for input(s): CKTOTAL, CKMB, CKMBINDEX, TROPONINI in the last 168 hours.  BNP: BNP (last 3 results) No results for input(s): BNP in the last 8760 hours.  ProBNP (last 3 results) No results for input(s): PROBNP in the last 8760 hours.    Other results:  Imaging: Dg Chest Port 1 View  Result Date: 07/18/2016 CLINICAL DATA:  Shortness of breath, history of cardiac surgery EXAM: PORTABLE CHEST 1 VIEW COMPARISON:  Portable chest x-ray of 07/17/2016 FINDINGS: The lungs appear better aerated. No pneumonia or effusion is seen and no pneumothorax is evident. A right PICC line remains with the tip overlying the lower SVC. Cardiomegaly is stable. Median sternotomy sutures are noted. Bilateral chest tubes have been a IMPRESSION: 1. Bilateral chest tubes removed. No pneumothorax. The lungs are clear. 2. Stable cardiomegaly. 3. No change in position of right PICC line. Electronically Signed   By: Ivar Drape M.D.   On: 07/18/2016 08:33     Medications:     Scheduled Medications: . bisacodyl  10 mg Oral Daily     Or  . bisacodyl  10 mg Rectal Daily  . feeding supplement (ENSURE ENLIVE)  237 mL Oral BID BM  . furosemide  40 mg Intravenous Q8H  . insulin aspart  0-15 Units Subcutaneous TID WC  . losartan  25 mg Oral Daily  . mouth rinse  15 mL Mouth Rinse BID  . potassium chloride  10 mEq Intravenous Q1 Hr x 3  . potassium chloride  10 mEq Intravenous Q1 Hr x 2  . potassium chloride  40 mEq Oral BID  . rosuvastatin  10 mg Oral QHS  . spironolactone  25 mg Oral Daily  . warfarin  5 mg Oral q1800  . Warfarin - Physician Dosing Inpatient   Does not apply q1800    Infusions: . sodium chloride    . heparin 1,850 Units/hr (07/19/16 0800)  . milrinone 0.125 mcg/kg/min (07/19/16 0800)    PRN Medications: fentaNYL (SUBLIMAZE) injection, Influenza vac split quadrivalent PF, metoprolol, ondansetron (ZOFRAN) IV, sodium chloride flush, sodium chloride flush   Assessment/Plan/Discussion   1. CAD: s/p redo CABG with MVR.  Restarting statin.  2. Acute on chronic systolic CHF: Ischemic cardiomyopathy.  Pre-op EF 35-40%.  Low co-ox suggestive of low output post-op, milrinone increased to 0.25 mcg.  - Today CO-OX 58% on milrinone 0.125 mcg.  Stop milrinone.  - Volume status improved.  CVP ~6-7.  Stop IV lasix. Start po lasix in am. Renal function stable. - Continue ted hose.  - Continue losartan to 25 mg daily and increase spiro to 25 mg daily.  - Will reassess EF and valve on TEE today.   3. Bradycardia: Underlying slow atrial flutter.  Currently RV-pacing.  Plan cardioversion tomorrow.  If remains bradycardic, may need CRT.  4. Atrial flutter: Post-op.  Clear flutter waves on telemetry.  Had post-op atrial fibrillation after 1st CABG.  Amiodarone stopped because of bradycardia.  - Continue heparin gtt and  warfarin tonight. INR 1.23  - Plan for TEE-guided DCCV Wednesday if remains in flutter.   5. S/p bioprosthetic MVR: Will need eventual post-op echo.   Length of Stay: [place]ire [place] NP-C   07/19/2016, 9:18 AM  Advanced Heart Failure Team Pager [phone] (M-F; 7a - 4p)  Please contact [facility] for night-coverage after hours (4p -7a ) and  weekends on [email or web address]  Agree with above note.  Stable co-ox with milrinone weaning, stop milrinone today.  Can stop IV Lasix and start po tomorrow.  He will have TEE-guided DCCV today with Dr. Stanford Breed.  Remains in flutter.  Will need to reassess underlying rhythm when he is back in sinus to see if he will need permanent pacing or if we can avoid.  If needs permanent pacing, will need CRT.   Loralie Champagne 07/19/2016 .

## 2016-07-19 NOTE — Progress Notes (Addendum)
      [address]       [place] [address]             [phone]        CARDIOTHORACIC SURGERY PROGRESS NOTE  R8DaysPost-OpS/P Procedure(s) (LRB): REDO CORONARY ARTERY BYPASS GRAFTING (CABG) x two using left leg greater saphenous vein harvested endoscopically-SVG to PDA -SVG to RAMUS INTERMEDIATE (N/A) TRANSESOPHAGEAL ECHOCARDIOGRAM (TEE) (N/A) MITRAL VALVE (MV) REPLACEMENT (N/A)   R8 Days Post-Op Procedure(s) (LRB): Re-exploration (CABG) for post op bleeding, (N/A)  Subjective: Feels well.  Had a good night.  Objective: Vital signs: BP Readings from Last 1 Encounters:  07/19/16 (!) 141/78   Pulse Readings from Last 1 Encounters:  07/19/16 81   Resp Readings from Last 1 Encounters:  07/19/16 (!) 28   Temp Readings from Last 1 Encounters:  07/19/16 98.1 F (36.7 C) (Oral)    Hemodynamics: CVP:  [4 mmHg-7 mmHg] 7 mmHg  Mixed venous co-ox 57.6%  Physical Exam:  Rhythm:   Aflutter w/ HR 60 - VVI pacing  Breath sounds: clear  Heart sounds:  RRR  Incisions:  Clean and dry  Abdomen:  Soft, non-distended, non-tender  Extremities:  Warm, well-perfused   Intake/Output from previous day: 10/17 0701 - 10/18 0700 In: 648.1 [I.V.:398.1; IV Piggyback:250] Out: 1550 [Urine:1550] Intake/Output this shift: No intake/output data recorded.  Lab Results:  CBC: Recent Labs  07/18/16 0227 07/19/16 0345  WBC 11.4* 11.1*  HGB 10.1* 9.6*  HCT 30.3* 29.6*  PLT 191 218    BMET:  Recent Labs  07/18/16 0227 07/19/16 0345  NA 140 136  K 3.1* 3.7  CL 104 99*  CO2 29 30  GLUCOSE 73 134*  BUN 16 14  CREATININE 1.25* 1.30*  CALCIUM 7.8* 8.2*     PT/INR:   Recent Labs  07/19/16 0345  LABPROT 15.6*  INR 1.23    CBG (last 3)   Recent Labs  07/18/16 1209 07/18/16 1535 07/18/16 2130  GLUCAP 154* 169* 142*    ABG    Component Value Date/Time   PHART 7.373 07/12/2016 1531   PCO2ART 40.1 07/12/2016 1531   PO2ART 67.0 (L)  07/12/2016 1531   HCO3 23.3 07/12/2016 1531   TCO2 25 07/12/2016 1531   ACIDBASEDEF 2.0 07/12/2016 1531   O2SAT 57.6 07/19/2016 0345    CXR: n/a  Assessment/Plan:  Overall stable POD8 Remainsin Aflutter w/ improvedventricular rate Stable BP with low CVP,mixed venous co-oxstable on milrinone 0.125 mcg/kg/min and VVI pacing at 80 Breathing comfortably w/ O2 sats 97-99% on RA Acute on chronic combined systolic and diastolic CHF w/ expected post op volume excess I/O's negative 900 mLyesterday but weight still 17lbs above preop ? accurate Expected post op acute blood loss anemia, mild, Hgb stable9.6 this morning Expected post op atelectasis, mild Type II diabetes mellitus, CBG's well controlled Hypokalemia - diuretic induced   Continue heparin/Coumadin  Mobilize  Diuresis  Supplement potassium  For TEE/DCCV later today  Remainder of plan per AHF team  Rexene Alberts, MD 07/19/2016 8:27 AM

## 2016-07-20 DIAGNOSIS — I442 Atrioventricular block, complete: Secondary | ICD-10-CM

## 2016-07-20 DIAGNOSIS — I2510 Atherosclerotic heart disease of native coronary artery without angina pectoris: Secondary | ICD-10-CM

## 2016-07-20 LAB — COOXEMETRY PANEL
Carboxyhemoglobin: 1.5 % (ref 0.5–1.5)
Carboxyhemoglobin: 1.6 % — ABNORMAL HIGH (ref 0.5–1.5)
METHEMOGLOBIN: 0.8 % (ref 0.0–1.5)
Methemoglobin: 0.9 % (ref 0.0–1.5)
O2 Saturation: 51.8 %
O2 Saturation: 54.4 %
TOTAL HEMOGLOBIN: 9.7 g/dL — AB (ref 12.0–16.0)
TOTAL HEMOGLOBIN: 10.6 g/dL — AB (ref 12.0–16.0)

## 2016-07-20 LAB — HEPARIN LEVEL (UNFRACTIONATED): Heparin Unfractionated: 0.39 IU/mL (ref 0.30–0.70)

## 2016-07-20 LAB — CBC
HEMATOCRIT: 31.8 % — AB (ref 39.0–52.0)
HEMOGLOBIN: 10.1 g/dL — AB (ref 13.0–17.0)
MCH: 28.2 pg (ref 26.0–34.0)
MCHC: 31.8 g/dL (ref 30.0–36.0)
MCV: 88.8 fL (ref 78.0–100.0)
Platelets: 221 10*3/uL (ref 150–400)
RBC: 3.58 MIL/uL — AB (ref 4.22–5.81)
RDW: 15 % (ref 11.5–15.5)
WBC: 13.1 10*3/uL — ABNORMAL HIGH (ref 4.0–10.5)

## 2016-07-20 LAB — GLUCOSE, CAPILLARY
GLUCOSE-CAPILLARY: 215 mg/dL — AB (ref 65–99)
GLUCOSE-CAPILLARY: 158 mg/dL — AB (ref 65–99)
Glucose-Capillary: 154 mg/dL — ABNORMAL HIGH (ref 65–99)
Glucose-Capillary: 148 mg/dL — ABNORMAL HIGH (ref 65–99)

## 2016-07-20 LAB — BASIC METABOLIC PANEL
Anion gap: 8 (ref 5–15)
BUN: 18 mg/dL (ref 6–20)
CALCIUM: 8.4 mg/dL — AB (ref 8.9–10.3)
CHLORIDE: 102 mmol/L (ref 101–111)
CO2: 28 mmol/L (ref 22–32)
CREATININE: 1.27 mg/dL — AB (ref 0.61–1.24)
GFR calc non Af Amer: 56 mL/min — ABNORMAL LOW (ref >60)
Glucose, Bld: 147 mg/dL — ABNORMAL HIGH (ref 65–99)
Potassium: 4.3 mmol/L (ref 3.5–5.1)
SODIUM: 138 mmol/L (ref 135–145)

## 2016-07-20 LAB — PROTIME-INR
INR: 1.22
PROTHROMBIN TIME: 15.5 s — AB (ref 11.4–15.2)

## 2016-07-20 LAB — CARBOXYHEMOGLOBIN - COOX: CARBOXYHEMOGLOBIN: 1.6 % — AB (ref 0.5–1.5)

## 2016-07-20 MED ORDER — FUROSEMIDE 40 MG PO TABS
40.0000 mg | ORAL_TABLET | Freq: Two times a day (BID) | ORAL | Status: DC
  Administered 2016-07-20 – 2016-07-22 (×5): 40 mg via ORAL
  Filled 2016-07-20 (×4): qty 1

## 2016-07-20 MED ORDER — MILRINONE LACTATE IN DEXTROSE 20-5 MG/100ML-% IV SOLN
0.2500 ug/kg/min | INTRAVENOUS | Status: DC
  Administered 2016-07-20: 0.125 ug/kg/min via INTRAVENOUS
  Administered 2016-07-21 – 2016-07-24 (×6): 0.25 ug/kg/min via INTRAVENOUS
  Administered 2016-07-25: 0.125 ug/kg/min via INTRAVENOUS
  Filled 2016-07-20 (×8): qty 100

## 2016-07-20 MED ORDER — LOSARTAN POTASSIUM 25 MG PO TABS
25.0000 mg | ORAL_TABLET | Freq: Two times a day (BID) | ORAL | Status: DC
  Administered 2016-07-20 – 2016-07-24 (×10): 25 mg via ORAL
  Filled 2016-07-20 (×9): qty 1

## 2016-07-20 MED FILL — Magnesium Sulfate Inj 50%: INTRAMUSCULAR | Qty: 10 | Status: AC

## 2016-07-20 MED FILL — Heparin Sodium (Porcine) Inj 1000 Unit/ML: INTRAMUSCULAR | Qty: 30 | Status: AC

## 2016-07-20 MED FILL — Potassium Chloride Inj 2 mEq/ML: INTRAVENOUS | Qty: 40 | Status: AC

## 2016-07-20 NOTE — Progress Notes (Signed)
ANTICOAGULATION CONSULT NOTE - Follow Up Consult  Pharmacy Consult for Heparin Indication: tissue MVR / aflutter  Allergies  Allergen Reactions  . Xanax [Alprazolam] Other (See Comments)    Pt feels very weak, tired and feels paralyzed      Patient Measurements: Height: 6' (182.9 cm) Weight: 192 lb 14.4 oz (87.5 kg) IBW/kg (Calculated) : 77.6   Vital Signs: Temp: 98.3 F (36.8 C) (10/19 1232) Temp Source: Oral (10/19 1232) BP: 136/83 (10/19 0900) Pulse Rate: 99 (10/19 0900)  Labs:  Recent Labs  07/18/16 0227  07/19/16 0345 07/19/16 0845 07/19/16 1639 07/20/16 0315  HGB 10.1*  --  9.6*  --   --  10.1*  HCT 30.3*  --  29.6*  --   --  31.8*  PLT 191  --  218  --   --  221  LABPROT  --   --  15.6*  --   --  15.5*  INR  --   --  1.23  --   --  1.22  HEPARINUNFRC <0.10*  < >  --  0.41 0.28* 0.39  CREATININE 1.25*  --  1.30*  --   --  1.27*  < > = values in this interval not displayed.  Estimated Creatinine Clearance: 61.1 mL/min (by C-G formula based on SCr of 1.27 mg/dL (H)).  Medications: Heparin @ 1950 units/hr  Assessment: 70yom continues on heparin for tissue MVR and aflutter. Heparin level therapeutic after dose adjustment last night. Coumadin resumed 10/17 per MD. Now s/p successful TEE guided cardioversion. He is now in complete heart block w/ temp pacemaker. Will need CRT soon. CBC stable. No bleeding noted.  Goal of Therapy:  Heparin level 0.3-0.5 units/ml Monitor platelets by anticoagulation protocol: Yes   Plan:  1) Continue heparin at 1950 units/hr 2) Daily heparin level and CBC  Melburn Popper, PharmD Clinical Pharmacy Resident Pager: [phone] 07/20/16 2:08 PM

## 2016-07-20 NOTE — Progress Notes (Signed)
Patient ID: Christian Bowman, male   DOB: 1946-04-25, 70 y.o.   MRN: [id number]  SICU Evening Rounds:  Hemodynamically stable today.  Co-ox was down to 52 and CVP 10  this am after milrinone stopped so it was restarted. Co-ox up slightly this afternoon to 54. CVP down to 4.  Remains AV paced 97.  sats 97%  Urine output ok.

## 2016-07-20 NOTE — Care Management Important Message (Signed)
Important Message  Patient Details  Name: Christian Bowman MRN: [id number] Date of Birth: [birth year]   Medicare Important Message Given:  Yes    [name] May 07/20/2016, 10:41 AM

## 2016-07-20 NOTE — Progress Notes (Signed)
SLP Cancellation Note  Patient Details Name: Christian Bowman MRN: [id number] DOB: Nov 13, 1945   Cancelled treatment:       Reason Eval/Treat Not Completed: Other (comment) Pt asleep upon SLP arrival. RN reports that he just fell asleep and hasn't been sleeping well, and she asks that we return at another time to allow him to rest. She reports improving intake and no coughing with meals. Will f/u as able.   [name] 07/20/2016, 3:36 PM   [name], M.A. CCC-SLP [phone]

## 2016-07-20 NOTE — Progress Notes (Addendum)
      [address]       [place] [address]             [phone]        CARDIOTHORACIC SURGERY PROGRESS NOTE   R1 Day Post-Op Procedure(s) (LRB): CARDIOVERSION (N/A) TRANSESOPHAGEAL ECHOCARDIOGRAM (TEE) (N/A)  Subjective: Feels weak and nauseated this morning, diaphoretic  Objective: Vital signs: BP Readings from Last 1 Encounters:  07/20/16 133/84   Pulse Readings from Last 1 Encounters:  07/20/16 (!) 102   Resp Readings from Last 1 Encounters:  07/20/16 (!) 25   Temp Readings from Last 1 Encounters:  07/20/16 97.8 F (36.6 C) (Oral)    Hemodynamics: CVP:  [10 mmHg-11 mmHg] 10 mmHg  Mixed venous co-ox 51.8%  Physical Exam:  Rhythm:   Sinus w/ CHB - DDD pacing  Breath sounds: clear  Heart sounds:  RRR  Incisions:  Clean and dry  Abdomen:  Soft, non-distended, non-tender  Extremities:  Warm, well-perfused    Intake/Output from previous day: 10/18 0701 - 10/19 0700 In: 1588.8 [P.O.:780; I.V.:658.8; IV Piggyback:150] Out: 1050 [Urine:1050] Intake/Output this shift: Total I/O In: 19 [I.V.:19] Out: -   Lab Results:  CBC: Recent Labs  07/19/16 0345 07/20/16 0315  WBC 11.1* 13.1*  HGB 9.6* 10.1*  HCT 29.6* 31.8*  PLT 218 221    BMET:  Recent Labs  07/19/16 0345 07/20/16 0315  NA 136 138  K 3.7 4.3  CL 99* 102  CO2 30 28  GLUCOSE 134* 147*  BUN 14 18  CREATININE 1.30* 1.27*  CALCIUM 8.2* 8.4*     PT/INR:   Recent Labs  07/20/16 0315  LABPROT 15.5*  INR 1.22    CBG (last 3)   Recent Labs  07/19/16 1816 07/19/16 2205 07/20/16 0836  GLUCAP 168* 120* 154*    ABG    Component Value Date/Time   PHART 7.373 07/12/2016 1531   PCO2ART 40.1 07/12/2016 1531   PO2ART 67.0 (L) 07/12/2016 1531   HCO3 23.3 07/12/2016 1531   TCO2 25 07/12/2016 1531   ACIDBASEDEF 2.0 07/12/2016 1531   O2SAT 51.8 07/20/2016 0420    CXR: n/a  Assessment/Plan: S/P Procedure(s) (LRB): CARDIOVERSION (N/A) TRANSESOPHAGEAL  ECHOCARDIOGRAM (TEE) (N/A)  Overall stable POD8 Maintaining sinus rhythm but still in complete heart block s/p DCCV yesterday Stable BP with low CVP,mixed venous co-ox decreased this morning after milrinone stopped yesterday Breathing comfortably w/ O2 sats 95-96% on RA Acute on chronic combined systolic and diastolic CHF w/ expected post op volume excess I/O's positive 533mLyesterday but weight down 3 lbs Expected post op acute blood loss anemia, mild, Hgb stable10.1 this morning Expected post op atelectasis, mild Type II diabetes mellitus, CBG's well controlled Leukocytosis - mild, new - possibly reactive to DCCV - no fevers   Agree w/ plan per advanced CHF team  Restart milrinone  Await EPS consult  Continue heparin/coumadin  Watch WBC  Rexene Alberts, MD 07/20/2016 8:55 AM

## 2016-07-20 NOTE — Progress Notes (Signed)
Patient ID: Christian Bowman, male   DOB: 15-Apr-1946, 70 y.o.   MRN: [id number]    Advanced Heart Failure Rounding Note   Subjective:    S/P CABG + bioprosthetic MVR   Milrinone stopped 10/18 prior to DCCV (atrial flutter).  TEE-guided DCCV 10/18, in NSR today.  Underlying rhythm remains CHB, temporary wire changed to DDD mode yesterday by Dr Roxy Manns.  Now, A-sensing V-pacing around 100 bpm.  Co-ox down to 51% today and feels nauseated.  CVP about 10. Weight down.   TEE (10/18): EF 20-25%, MV bioprosthesis looks ok, moderately decreased RV systolic function.   Objective:   Weight Range:  Vital Signs:   Temp:  [97.8 F (36.6 C)-99.1 F (37.3 C)] 97.8 F (36.6 C) (10/19 0838) Pulse Rate:  [79-109] 102 (10/19 0800) Resp:  [15-29] 25 (10/19 0800) BP: (101-137)/(56-90) 133/84 (10/19 0800) SpO2:  [92 %-98 %] 96 % (10/19 0800) Weight:  [192 lb 14.4 oz (87.5 kg)] 192 lb 14.4 oz (87.5 kg) (10/19 0500) Last BM Date: 07/18/16  Weight change: Filed Weights   07/18/16 0500 07/19/16 0600 07/20/16 0500  Weight: 195 lb 15.8 oz (88.9 kg) 195 lb 12.3 oz (88.8 kg) 192 lb 14.4 oz (87.5 kg)    Intake/Output:   Intake/Output Summary (Last 24 hours) at 07/20/16 0858 Last data filed at 07/20/16 0800  Gross per 24 hour  Intake          1536.21 ml  Output              700 ml  Net           836.21 ml    Physical Exam: CVP 10 General:  Ill-appearing. No resp difficulty. In the chair. HEENT: normal except R neck cyst.  Neck: supple. JVP 8-9 cm. Carotids 2+ bilat; no bruits. No lymphadenopathy or thryomegaly appreciated. Cor: PMI nondisplaced. Regular rate & rhythm. No rubs, gallops or murmurs. Lungs: clear Abdomen: soft, nontender, nondistended. No hepatosplenomegaly. No bruits or masses. Good bowel sounds. Extremities: no cyanosis, clubbing, rash, R and LLE trace edema ted hose in place  Neuro: alert & orientedx3, cranial nerves grossly intact. moves all 4 extremities w/o difficulty. Affect  pleasant  Telemetry:  NSR with V-pacing  Labs: Basic Metabolic Panel:  Recent Labs Lab 07/16/16 0300 07/17/16 0335 07/17/16 1733 07/18/16 0227 07/19/16 0345 07/20/16 0315  NA 141 142 139 140 136 138  K 3.0* 3.3* 3.4* 3.1* 3.7 4.3  CL 105 107 103 104 99* 102  CO2 27 28 29 29 30 28   GLUCOSE 146* 142* 147* 73 134* 147*  BUN 21* 22* 20 16 14 18   CREATININE 1.32* 1.29* 1.25* 1.25* 1.30* 1.27*  CALCIUM 8.2* 8.2* 7.9* 7.8* 8.2* 8.4*  MG 2.0 2.1  --   --   --   --   PHOS 2.8 2.9  --   --   --   --     Liver Function Tests:  Recent Labs Lab 07/16/16 0300 07/17/16 0335  AST 33 48*  ALT 29 36  ALKPHOS 51 48  BILITOT 1.7* 1.8*  PROT 5.2* 5.0*  ALBUMIN 2.5* 2.4*   No results for input(s): LIPASE, AMYLASE in the last 168 hours. No results for input(s): AMMONIA in the last 168 hours.  CBC:  Recent Labs Lab 07/16/16 0300 07/17/16 0335 07/18/16 0227 07/19/16 0345 07/20/16 0315  WBC 10.9* 9.8 11.4* 11.1* 13.1*  NEUTROABS  --  7.0  --   --   --  HGB 10.1* 9.7* 10.1* 9.6* 10.1*  HCT 30.6* 30.1* 30.3* 29.6* 31.8*  MCV 85.7 86.5 85.8 88.1 88.8  PLT 127* 152 191 218 221    Cardiac Enzymes: No results for input(s): CKTOTAL, CKMB, CKMBINDEX, TROPONINI in the last 168 hours.  BNP: BNP (last 3 results) No results for input(s): BNP in the last 8760 hours.  ProBNP (last 3 results) No results for input(s): PROBNP in the last 8760 hours.    Other results:  Imaging: No results found.   Medications:     Scheduled Medications: . bisacodyl  10 mg Oral Daily   Or  . bisacodyl  10 mg Rectal Daily  . feeding supplement (ENSURE ENLIVE)  237 mL Oral BID BM  . furosemide  40 mg Oral BID  . insulin aspart  0-15 Units Subcutaneous TID WC  . losartan  25 mg Oral BID  . potassium chloride  40 mEq Oral BID  . rosuvastatin  10 mg Oral QHS  . spironolactone  25 mg Oral Daily  . warfarin  5 mg Oral q1800  . Warfarin - Physician Dosing Inpatient   Does not apply q1800     Infusions: . sodium chloride Stopped (07/19/16 1344)  . heparin 1,900 Units/hr (07/19/16 2000)  . milrinone      PRN Medications: fentaNYL (SUBLIMAZE) injection, Influenza vac split quadrivalent PF, metoprolol, ondansetron (ZOFRAN) IV, sodium chloride flush, sodium chloride flush   Assessment/Plan/Discussion   1. CAD: s/p redo CABG with MVR.  Restarted statin.  2. Acute on chronic systolic CHF: Ischemic cardiomyopathy.  Pre-op EF 35-40%.  TEE 10/18 with EF 20-25%, moderate RV dysfunction.  Low co-ox suggestive of low output post-op, weaned off milrinone yesterday but today co-ox down to 51% and feels nauseated. He is RV pacing with underlying CHB. Probably mild volume overload.  - Restart milrinone at 0.125, repeat co-ox in pm.  - Increase Lasix to 40 bid.  - Increase losartan to 25 bid and continue current spironolactone.  - RV pacing is not helping things.  Suspect he will require CRT, will consult EP today => will need to discuss ICD as well.  - Once plans on pacing finalized, will start digoxin.   3. Bradycardia: Underlying CHB.  Currently RV-pacing.  Consult EP for CRT.  4. Atrial flutter: Post-op.  DCCV on 10/18, remains in NSR today with v-pacing. - Continue heparin gtt and  warfarin. INR 1.22  - Once plans finalized on pacing, will likely put him on amiodarone for at least a month given post-op flutter.  5. S/p bioprosthetic MVR: Stable on TEE 10/18.   35 minutes critical care time.   Length of Stay: [place] 07/20/2016, 8:58 AM  Advanced Heart Failure Team Pager [phone] (M-F; 7a - 4p)  Please contact [facility] for night-coverage after hours (4p -7a ) and weekends on [email or web address]

## 2016-07-20 NOTE — Consult Note (Signed)
ELECTROPHYSIOLOGY CONSULT NOTE    Patient ID: Christian Bowman MRN: [id number], DOB/AGE: 68-Feb-1947 68 y.o.  Admit date: 07/11/2016 Date of Consult: @TODAY @  Primary Physician: Purvis Kilts, MD Primary Cardiologist: Croitrou  Reason for Consultation: heart block, CHF  HPI: He has a history of ischemic cardiomyopathy with an EF of 35% and mitral regurgitation. He presented to the hospital for mitral valve repair. On 10/10 had a redo sternotomy with multivessel CABG 2 with a saphenous vein to the PDA and a saphenous vein to intermediate branch, and mitral valve replacement. He had postoperative bleeding and required reexploration which found bleeding from the left upper lobe along the left internal mammary. Postop, he went into atrial flutter and had a TEE and successful cardioversion back to sinus rhythm. Currently, he is in complete heart block with his temporary pacemaker pacing him at DDD with a rate of 103. He complains of weakness and fatigue still not feeling well after his operation. He is very frustrated with his course and expected it to see improvement much more quickly. Is on heparin and coumadin with INR not yet therapeutic.  Past Medical History:  Diagnosis Date  . Anginal pain ([place])   . CAD (coronary artery disease)   . Coronary artery disease involving coronary bypass graft   . Cyst of neck    right side  . DM2 (diabetes mellitus, type 2) ([place]) 08/26/2013  . Dyspnea   . Heart attack   . HTN (hypertension) 08/26/2013  . Hyperlipidemia 08/26/2013  . Left main coronary artery disease   . Left renal artery stenosis ([place])    Genesis 6x12 stent 2007  . Obesity (BMI 30.0-34.9) 08/26/2013  . Postoperative atrial fibrillation ([place]) 10/15/2005  . S/P CABG x 4 10/13/2005   LIMA to LAD, SVG to intermediate branch, sequential SVG to PDA and RPL branch, EVH via right thigh  . S/P mitral valve replacement with bioprosthetic valve 07/11/2016   31 mm [facility] Mitral bovine  bioprosthetic tissue valve  . S/P redo CABG x 2 07/11/2016   SVG to PDA and SVG to Intermediate Branch, EVH via left thigh     Surgical History:  Past Surgical History:  Procedure Laterality Date  . CARDIAC CATHETERIZATION N/A 06/21/2016   Procedure: Right/Left Heart Cath and Coronary/Graft Angiography;  Surgeon: Sherren Mocha, MD;  Location: [place] [facility] CV LAB;  Service: Cardiovascular;  Laterality: N/A;  . CARDIOVERSION N/A 07/19/2016   Procedure: CARDIOVERSION;  Surgeon: Lelon Perla, MD;  Location: [facility] ENDOSCOPY;  Service: Cardiovascular;  Laterality: N/A;  . CORONARY ARTERY BYPASS GRAFT  10/13/2005   LIMA to LAD, SVG to intermediate branch, sequential SVG to PDA and RPL  . CORONARY ARTERY BYPASS GRAFT N/A 07/11/2016   Procedure: REDO CORONARY ARTERY BYPASS GRAFTING (CABG) x two using left leg greater saphenous vein harvested endoscopically-SVG to PDA -SVG to RAMUS INTERMEDIATE;  Surgeon: Rexene Alberts, MD;  Location: [place];  Service: Open Heart Surgery;  Laterality: N/A;  . CORONARY ARTERY BYPASS GRAFT N/A 07/11/2016   Procedure: Re-exploration (CABG) for post op bleeding,;  Surgeon: Rexene Alberts, MD;  Location: [place];  Service: Open Heart Surgery;  Laterality: N/A;  . MITRAL VALVE REPLACEMENT N/A 07/11/2016   Procedure: MITRAL VALVE (MV) REPLACEMENT;  Surgeon: Rexene Alberts, MD;  Location: [place];  Service: Open Heart Surgery;  Laterality: N/A;  . MYOCARDICAL PERFUSION  10/09/2007   NORMAL PERFUSION IN ALL REGIONS;NO EVIDENCE OF INDUCIBLE ISCHEMIA;POST STRESS EF% 66  .  PERCUTANEOUS CORONARY STENT INTERVENTION (PCI-S)     DES in SVG to right coronary artery system  . RENAL ARTERY STENT Right 2007  . RENAL DOPPLER  03/28/2010   RIGHT RA-NORMAL;LEFT PROXIMAL RA AT STENT-PATENT WITH NO EVIDENCE OF SIGN DIAMETER REDUCTION. R & L KIDNEYS: EQUAL IN SIZE,SYMMETRICAL IN SHAPE.  . TEE WITHOUT CARDIOVERSION N/A 06/15/2016   Procedure: TRANSESOPHAGEAL ECHOCARDIOGRAM (TEE);  Surgeon: Sanda Klein, MD;  Location: [facility] ENDOSCOPY;  Service: Cardiovascular;  Laterality: N/A;  . TEE WITHOUT CARDIOVERSION N/A 07/11/2016   Procedure: TRANSESOPHAGEAL ECHOCARDIOGRAM (TEE);  Surgeon: Rexene Alberts, MD;  Location: [place];  Service: Open Heart Surgery;  Laterality: N/A;  . TEE WITHOUT CARDIOVERSION N/A 07/19/2016   Procedure: TRANSESOPHAGEAL ECHOCARDIOGRAM (TEE);  Surgeon: Lelon Perla, MD;  Location: [facility] ENDOSCOPY;  Service: Cardiovascular;  Laterality: N/A;  . TRANSESOPHAGEAL ECHOCARDIOGRAM  10/19/2005   NORMAL LV; MILD TO MODERATE AMOUNT OF SOFT ATHEROMATOUS PLAQUE OF THE THORACIC AORTA; THE LEFT ATRIUM IS MILDLY DILATED;LEFT ATRIAL APPENDAGE FUNCTION IS NORMAL;NO THROMBUS IDENTIFIED. SMALL PFO WITH RIGHT TO LEFT SHUNT     Prescriptions Prior to Admission  Medication Sig Dispense Refill Last Dose  . amLODipine (NORVASC) 2.5 MG tablet Take 1 tablet (2.5 mg total) by mouth daily. (Patient taking differently: Take 2.5 mg by mouth every evening. ) 180 tablet 3 07/10/2016 at Unknown time  . aspirin EC 81 MG tablet Take 81 mg by mouth every evening.   07/10/2016 at Unknown time  . carvedilol (COREG) 25 MG tablet Take 1 tablet (25 mg total) by mouth 2 (two) times daily. 180 tablet 3 07/11/2016 at Unknown time  . clopidogrel (PLAVIX) 75 MG tablet TAKE ONE TABLET BY MOUTH ONCE DAILY 90 tablet 3 07/03/2016  . furosemide (LASIX) 40 MG tablet Take 1 tablet (40 mg total) by mouth 2 (two) times daily. 180 tablet 3 07/10/2016 at Unknown time  . lisinopril (PRINIVIL,ZESTRIL) 40 MG tablet Take 1 tablet (40 mg total) by mouth daily. (Patient taking differently: Take 40 mg by mouth every evening. ) 90 tablet 3 07/10/2016 at Unknown time  . metFORMIN (GLUCOPHAGE) 500 MG tablet Take 1 tablet (500 mg total) by mouth 2 (two) times daily with a meal. 180 tablet 1 07/10/2016 at Unknown time  . potassium chloride (K-DUR,KLOR-CON) 10 MEQ tablet Take 2 tablets (20 mEq total) by mouth 2 (two) times daily. 360 tablet 3  07/10/2016 at Unknown time  . rosuvastatin (CRESTOR) 10 MG tablet Take 1 tablet (10 mg total) by mouth daily. (Patient taking differently: Take 10 mg by mouth every evening. ) 90 tablet 3 07/10/2016 at Unknown time  . glucose blood (ACCU-CHEK AVIVA) test strip Use as instructed bid E11.65 100 each 5 Taking    Inpatient Medications:  . bisacodyl  10 mg Oral Daily   Or  . bisacodyl  10 mg Rectal Daily  . feeding supplement (ENSURE ENLIVE)  237 mL Oral BID BM  . furosemide  40 mg Oral BID  . insulin aspart  0-15 Units Subcutaneous TID WC  . losartan  25 mg Oral BID  . potassium chloride  40 mEq Oral BID  . rosuvastatin  10 mg Oral QHS  . spironolactone  25 mg Oral Daily  . warfarin  5 mg Oral q1800  . Warfarin - Physician Dosing Inpatient   Does not apply q1800    Allergies:  Allergies  Allergen Reactions  . Xanax [Alprazolam] Other (See Comments)    Pt feels very weak, tired and feels  paralyzed      Social History   Social History  . Marital status: Married    Spouse name: N/A  . Number of children: N/A  . Years of education: N/A   Occupational History  . Not on file.   Social History Main Topics  . Smoking status: Never Smoker  . Smokeless tobacco: Never Used  . Alcohol use No  . Drug use: No  . Sexual activity: Not Currently   Other Topics Concern  . Not on file   Social History Narrative  . No narrative on file     Family History  Problem Relation Age of Onset  . Heart attack Mother   . Heart disease Mother   . Heart attack Father   . Heart disease Father   . Hypertension Father   . Hypertension Sister   . Heart disease Maternal Grandmother      Review of Systems: General: No chills, fever, night sweats or weight changes  Cardiovascular:  No chest pain, dyspnea on exertion, edema, orthopnea, palpitations, paroxysmal nocturnal dyspnea Dermatological: No rash, lesions or masses Respiratory: No cough, dyspnea Urologic: No hematuria, dysuria Abdominal:  No nausea, vomiting, diarrhea, bright red blood per rectum, melena, or hematemesis Neurologic: No visual changes, weakness, changes in mental status All other systems reviewed and are otherwise negative except as noted above.  Physical Exam: Vitals:   07/20/16 0700 07/20/16 0800 07/20/16 0838 07/20/16 0900  BP: 134/81 133/84  136/83  Pulse: (!) 101 (!) 102  99  Resp: 15 (!) 25  (!) 22  Temp:   97.8 F (36.6 C)   TempSrc:   Oral   SpO2: 95% 96%  95%  Weight:      Height:        GEN- ill appearing, alert and oriented x 3 today.   HEENT: normocephalic, atraumatic; sclera clear, conjunctiva pink; hearing intact; oropharynx clear; neck supple, 8-9 cm JVP Lymph- no cervical lymphadenopathy Lungs- Clear to ausculation bilaterally, normal work of breathing.  No wheezes, rales, rhonchi Heart- Regular rate and rhythm, no murmurs, rubs or gallops, PMI not laterally displaced GI- soft, non-tender, non-distended, bowel sounds present, no hepatosplenomegaly Extremities- no clubbing, cyanosis, or edema; DP/PT/radial pulses 2+ bilaterally MS- no significant deformity or atrophy Skin- warm and dry, no rash or lesion Psych- euthymic mood, full affect Neuro- strength and sensation are intact  Labs:   Lab Results  Component Value Date   WBC 13.1 (H) 07/20/2016   HGB 10.1 (L) 07/20/2016   HCT 31.8 (L) 07/20/2016   MCV 88.8 07/20/2016   PLT 221 07/20/2016    Recent Labs Lab 07/17/16 0335  07/20/16 0315  NA 142  < > 138  K 3.3*  < > 4.3  CL 107  < > 102  CO2 28  < > 28  BUN 22*  < > 18  CREATININE 1.29*  < > 1.27*  CALCIUM 8.2*  < > 8.4*  PROT 5.0*  --   --   BILITOT 1.8*  --   --   ALKPHOS 48  --   --   ALT 36  --   --   AST 48*  --   --   GLUCOSE 142*  < > 147*  < > = values in this interval not displayed.    Radiology/Studies:  CXR 07/18/16 1. Bilateral chest tubes removed. No pneumothorax. The lungs are clear. 2. Stable cardiomegaly. 3. No change in position of right  PICC line.  EKG:  07/17/16 atrial flutter, V paced  TELEMETRY: A sense V pace   Assessment/Plan  1. Complete AV block: None being complete heart block after redo CABG and MVR. Has not had any evidence of recovery of his conduction system. Therefore Cane Dubray require pacing. His ejection fraction is down to 25-30%, and therefore Kasper Mudrick require a CRT device. Risks and benefits of the procedure were discussed. Risks include bleeding, tamponade, infection, pneumothorax. The patient understands these risks and has agreed to the procedure. He was recently cardioverted and his INR is not therapeutic. We'll wait for a therapeutic INR so that he does not require heparin as this would put him at a very high risk of stroke.  2. Ischemic cardiomyopathy: Ejection fraction 25-30%. This may improve now that he is status post MVR and a redo CABG. Due to the fact that his ejection fraction is low, though and we are planning a pacemaker, we'll place an ICD, so he does not need multiple procedures.  3. Atrial flutter: Status post cardioversion to sinus rhythm. Is on both Coumadin and heparin. We'll wait for INR to be therapeutic prior to stopping the Coumadin and placing the defibrillator.   Signed, Allegra Lai, MD 07/20/2016 11:58 AM

## 2016-07-21 DIAGNOSIS — R001 Bradycardia, unspecified: Secondary | ICD-10-CM

## 2016-07-21 DIAGNOSIS — I340 Nonrheumatic mitral (valve) insufficiency: Principal | ICD-10-CM

## 2016-07-21 LAB — BASIC METABOLIC PANEL
Anion gap: 7 (ref 5–15)
BUN: 19 mg/dL (ref 6–20)
CALCIUM: 8.2 mg/dL — AB (ref 8.9–10.3)
CO2: 30 mmol/L (ref 22–32)
Chloride: 99 mmol/L — ABNORMAL LOW (ref 101–111)
Creatinine, Ser: 1.22 mg/dL (ref 0.61–1.24)
GFR calc Af Amer: >60 mL/min (ref >60)
GFR, EST NON AFRICAN AMERICAN: 59 mL/min — AB (ref >60)
GLUCOSE: 148 mg/dL — AB (ref 65–99)
POTASSIUM: 4.1 mmol/L (ref 3.5–5.1)
Sodium: 136 mmol/L (ref 135–145)

## 2016-07-21 LAB — GLUCOSE, CAPILLARY
GLUCOSE-CAPILLARY: 154 mg/dL — AB (ref 65–99)
GLUCOSE-CAPILLARY: 220 mg/dL — AB (ref 65–99)
GLUCOSE-CAPILLARY: 159 mg/dL — AB (ref 65–99)
Glucose-Capillary: 173 mg/dL — ABNORMAL HIGH (ref 65–99)

## 2016-07-21 LAB — COOXEMETRY PANEL
Carboxyhemoglobin: 1.6 % — ABNORMAL HIGH (ref 0.5–1.5)
METHEMOGLOBIN: 0.8 % (ref 0.0–1.5)
O2 Saturation: 53 %
Total hemoglobin: 10.2 g/dL — ABNORMAL LOW (ref 12.0–16.0)

## 2016-07-21 LAB — PROTIME-INR
INR: 1.2
Prothrombin Time: 15.3 seconds — ABNORMAL HIGH (ref 11.4–15.2)

## 2016-07-21 LAB — CBC
HCT: 32.3 % — ABNORMAL LOW (ref 39.0–52.0)
Hemoglobin: 10.2 g/dL — ABNORMAL LOW (ref 13.0–17.0)
MCH: 28.2 pg (ref 26.0–34.0)
MCHC: 31.6 g/dL (ref 30.0–36.0)
MCV: 89.2 fL (ref 78.0–100.0)
PLATELETS: 255 10*3/uL (ref 150–400)
RBC: 3.62 MIL/uL — ABNORMAL LOW (ref 4.22–5.81)
RDW: 15.1 % (ref 11.5–15.5)
WBC: 14.2 10*3/uL — AB (ref 4.0–10.5)

## 2016-07-21 LAB — HEPARIN LEVEL (UNFRACTIONATED)
HEPARIN UNFRACTIONATED: 0.26 [IU]/mL — AB (ref 0.30–0.70)
Heparin Unfractionated: 0.1 IU/mL — ABNORMAL LOW (ref 0.30–0.70)
Heparin Unfractionated: 0.49 IU/mL (ref 0.30–0.70)

## 2016-07-21 MED ORDER — DIGOXIN 125 MCG PO TABS
0.1250 mg | ORAL_TABLET | Freq: Every day | ORAL | Status: DC
  Administered 2016-07-21 – 2016-08-01 (×11): 0.125 mg via ORAL
  Filled 2016-07-21 (×11): qty 1

## 2016-07-21 MED ORDER — WARFARIN SODIUM 7.5 MG PO TABS
7.5000 mg | ORAL_TABLET | Freq: Every day | ORAL | Status: DC
  Administered 2016-07-21: 7.5 mg via ORAL
  Filled 2016-07-21: qty 1

## 2016-07-21 MED ORDER — AMIODARONE HCL 200 MG PO TABS
200.0000 mg | ORAL_TABLET | Freq: Two times a day (BID) | ORAL | Status: DC
  Administered 2016-07-21 – 2016-07-27 (×13): 200 mg via ORAL
  Filled 2016-07-21 (×14): qty 1

## 2016-07-21 NOTE — Progress Notes (Signed)
Patient ID: Christian Bowman, male   DOB: 04/02/1946, 70 y.o.   MRN: [id number]    Advanced Heart Failure Rounding Note   Subjective:    S/P CABG + bioprosthetic MVR   Milrinone stopped 10/18 prior to DCCV (atrial flutter).  TEE-guided DCCV 10/18.  Underlying rhythm remained CHB, temporary wire changed to DDD mode by Dr Roxy Manns.  Today, I see P waves but not sure they are being adequately tracked.  He is v-pacing in the 75s.    Co-ox low yesterday, milrinone restarted at 0.125.  Co-ox remains 53% today.  Still nauseated but better than yesterday.  Walked today in hall, breathing ws ok.  CVP 6 today.   TEE (10/18): EF 20-25%, MV bioprosthesis looks ok, moderately decreased RV systolic function.   Objective:   Weight Range:  Vital Signs:   Temp:  [98.3 F (36.8 C)-99.2 F (37.3 C)] 98.3 F (36.8 C) (10/20 0841) Pulse Rate:  [71-103] 71 (10/20 0700) Resp:  [20-30] 25 (10/20 0700) BP: (98-140)/(50-88) 140/70 (10/20 0600) SpO2:  [93 %-99 %] 99 % (10/20 0700) Weight:  [197 lb 12 oz (89.7 kg)] 197 lb 12 oz (89.7 kg) (10/20 0600) Last BM Date: 07/18/16  Weight change: Filed Weights   07/19/16 0600 07/20/16 0500 07/21/16 0600  Weight: 195 lb 12.3 oz (88.8 kg) 192 lb 14.4 oz (87.5 kg) 197 lb 12 oz (89.7 kg)    Intake/Output:   Intake/Output Summary (Last 24 hours) at 07/21/16 0909 Last data filed at 07/21/16 0700  Gross per 24 hour  Intake           491.73 ml  Output              850 ml  Net          -358.27 ml    Physical Exam: CVP 6 General:  Ill-appearing. No resp difficulty. In the chair. HEENT: normal except R neck cyst.  Neck: supple. JVP 7 cm. Carotids 2+ bilat; no bruits. No lymphadenopathy or thryomegaly appreciated. Cor: PMI nondisplaced. Regular rate & rhythm. No rubs, gallops or murmurs. Lungs: clear Abdomen: soft, nontender, nondistended. No hepatosplenomegaly. No bruits or masses. Good bowel sounds. Extremities: no cyanosis, clubbing, rash, R and LLE trace edema ted  hose in place  Neuro: alert & orientedx3, cranial nerves grossly intact. moves all 4 extremities w/o difficulty. Affect pleasant  Telemetry:  ?sinus with v-pacing, not sure that ventricular pacing is tracking the atrium adequately.   Labs: Basic Metabolic Panel:  Recent Labs Lab 07/16/16 0300 07/17/16 0335 07/17/16 1733 07/18/16 0227 07/19/16 0345 07/20/16 0315 07/21/16 0415  NA 141 142 139 140 136 138 136  K 3.0* 3.3* 3.4* 3.1* 3.7 4.3 4.1  CL 105 107 103 104 99* 102 99*  CO2 27 28 29 29 30 28 30   GLUCOSE 146* 142* 147* 73 134* 147* 148*  BUN 21* 22* 20 16 14 18 19   CREATININE 1.32* 1.29* 1.25* 1.25* 1.30* 1.27* 1.22  CALCIUM 8.2* 8.2* 7.9* 7.8* 8.2* 8.4* 8.2*  MG 2.0 2.1  --   --   --   --   --   PHOS 2.8 2.9  --   --   --   --   --     Liver Function Tests:  Recent Labs Lab 07/16/16 0300 07/17/16 0335  AST 33 48*  ALT 29 36  ALKPHOS 51 48  BILITOT 1.7* 1.8*  PROT 5.2* 5.0*  ALBUMIN 2.5* 2.4*   No results for input(s):  LIPASE, AMYLASE in the last 168 hours. No results for input(s): AMMONIA in the last 168 hours.  CBC:  Recent Labs Lab 07/17/16 0335 07/18/16 0227 07/19/16 0345 07/20/16 0315 07/21/16 0415  WBC 9.8 11.4* 11.1* 13.1* 14.2*  NEUTROABS 7.0  --   --   --   --   HGB 9.7* 10.1* 9.6* 10.1* 10.2*  HCT 30.1* 30.3* 29.6* 31.8* 32.3*  MCV 86.5 85.8 88.1 88.8 89.2  PLT 152 191 218 221 255    Cardiac Enzymes: No results for input(s): CKTOTAL, CKMB, CKMBINDEX, TROPONINI in the last 168 hours.  BNP: BNP (last 3 results) No results for input(s): BNP in the last 8760 hours.  ProBNP (last 3 results) No results for input(s): PROBNP in the last 8760 hours.    Other results:  Imaging: No results found.   Medications:     Scheduled Medications: . amiodarone  200 mg Oral BID  . bisacodyl  10 mg Oral Daily   Or  . bisacodyl  10 mg Rectal Daily  . digoxin  0.125 mg Oral Daily  . feeding supplement (ENSURE ENLIVE)  237 mL Oral BID BM    . furosemide  40 mg Oral BID  . insulin aspart  0-15 Units Subcutaneous TID WC  . losartan  25 mg Oral BID  . potassium chloride  40 mEq Oral BID  . rosuvastatin  10 mg Oral QHS  . spironolactone  25 mg Oral Daily  . warfarin  5 mg Oral q1800  . Warfarin - Physician Dosing Inpatient   Does not apply q1800    Infusions: . sodium chloride Stopped (07/19/16 1344)  . heparin 2,050 Units/hr (07/21/16 0700)  . milrinone 0.125 mcg/kg/min (07/21/16 0700)    PRN Medications: fentaNYL (SUBLIMAZE) injection, Influenza vac split quadrivalent PF, metoprolol, ondansetron (ZOFRAN) IV, sodium chloride flush, sodium chloride flush   Assessment/Plan/Discussion   1. CAD: s/p redo CABG with MVR.  Restarted statin.  2. Acute on chronic systolic CHF: Ischemic cardiomyopathy.  Pre-op EF 35-40%.  TEE 10/18 with EF 20-25%, moderate RV dysfunction.  Low co-ox suggestive of low output post-op, weaned off milrinone but co-ox down to 51% and felt nauseated, so milrinone restarted yesterday at 0.125.  Co-ox 53% today. He is RV pacing with underlying CHB. Volume looks ok today with CVP 6. - Increase milrinone to 0.25.  - Continue Lasix 40 mg po bid.  - Add digoxin now that we have finalized plans for CRT.  - Continue losartan 25 bid and spironolactone.  Suspect we can eventually transition to [facility].  - No beta blocker for now with low output.  - RV pacing is not helping things, and now I am not sure that he is tracking his atrium (in DDD mode).  Plan for CRT-D when INR > 2 and we can stop heparin (hopefully Monday, Dr. Curt Bears has seen).  Will get ECG today.   - I hope that with CRT and off RV pacing we can wean him off milrinone.  3. Bradycardia: Underlying CHB.  Currently RV-pacing.  Plan for CRT-D Monday.  ECG today b/c he does not appear to be tracking his P waves with RV pacing.  4. Atrial flutter: Post-op.  DCCV on 10/18, think he is in NSR today. - Continue heparin gtt with warfarin until INR > 2.  -  ECG to confirm rhythm.  - Will place him on amiodarone 200 mg bid now that we have finalized plan for pacing.  5. S/p bioprosthetic MVR: Stable  on TEE 10/18.   35 minutes critical care time.   Length of Stay: [place] 07/21/2016, 9:09 AM  Advanced Heart Failure Team Pager [phone] (M-F; 7a - 4p)  Please contact [facility] for night-coverage after hours (4p -7a ) and weekends on [email or web address]

## 2016-07-21 NOTE — Progress Notes (Signed)
      [address]       ,[place] [address]             [phone]        CARDIOTHORACIC SURGERY PROGRESS NOTE   R2 Days Post-Op Procedure(s) (LRB): CARDIOVERSION (N/A) TRANSESOPHAGEAL ECHOCARDIOGRAM (TEE) (N/A)  Subjective: Feels nauseated, weak.  No SOB.  Minimal pain.  Objective: Vital signs: BP Readings from Last 1 Encounters:  07/21/16 112/72   Pulse Readings from Last 1 Encounters:  07/21/16 70   Resp Readings from Last 1 Encounters:  07/21/16 (!) 29   Temp Readings from Last 1 Encounters:  07/21/16 98.3 F (36.8 C) (Oral)    Hemodynamics: CVP:  [1 mmHg-10 mmHg] 1 mmHg  Mixed venous co-ox 53%  Physical Exam:  Rhythm:   AV paced  Breath sounds: clear  Heart sounds:  RRR  Incisions:  Clean and dry  Abdomen:  Soft, non-distended, non-tender  Extremities:  Warm, well-perfused   Intake/Output from previous day: 10/19 0701 - 10/20 0700 In: 529.7 [I.V.:529.7] Out: 1100 [Urine:1100] Intake/Output this shift: No intake/output data recorded.  Lab Results:  CBC: Recent Labs  07/20/16 0315 07/21/16 0415  WBC 13.1* 14.2*  HGB 10.1* 10.2*  HCT 31.8* 32.3*  PLT 221 255    BMET:  Recent Labs  07/20/16 0315 07/21/16 0415  NA 138 136  K 4.3 4.1  CL 102 99*  CO2 28 30  GLUCOSE 147* 148*  BUN 18 19  CREATININE 1.27* 1.22  CALCIUM 8.4* 8.2*     PT/INR:   Recent Labs  07/21/16 0415  LABPROT 15.3*  INR 1.20    CBG (last 3)   Recent Labs  07/20/16 1808 07/20/16 2143 07/21/16 0839  GLUCAP 148* 158* 154*    ABG    Component Value Date/Time   PHART 7.373 07/12/2016 1531   PCO2ART 40.1 07/12/2016 1531   PO2ART 67.0 (L) 07/12/2016 1531   HCO3 23.3 07/12/2016 1531   TCO2 25 07/12/2016 1531   ACIDBASEDEF 2.0 07/12/2016 1531   O2SAT 53.0 07/21/2016 0420    CXR: n/a  Assessment/Plan: S/P Procedure(s) (LRB): CARDIOVERSION (N/A) TRANSESOPHAGEAL ECHOCARDIOGRAM (TEE) (N/A)  Stable but no significant progress May  need to increase milrinone further Plan per advanced heart failure team  Rexene Alberts, MD 07/21/2016 9:21 AM

## 2016-07-21 NOTE — Progress Notes (Addendum)
ANTICOAGULATION CONSULT NOTE - Follow Up Consult  Pharmacy Consult for Heparin Indication: tissue MVR / aflutter  Allergies  Allergen Reactions  . Xanax [Alprazolam] Other (See Comments)    Pt feels very weak, tired and feels paralyzed      Patient Measurements: Height: 6' (182.9 cm) Weight: 192 lb 14.4 oz (87.5 kg) IBW/kg (Calculated) : 77.6   Vital Signs: Temp: 98.6 F (37 C) (10/20 0400) Temp Source: Oral (10/20 0400) BP: 133/75 (10/20 0400) Pulse Rate: 99 (10/20 0400)  Labs:  Recent Labs  07/19/16 0345  07/19/16 1639 07/20/16 0315 07/21/16 0339 07/21/16 0415  HGB 9.6*  --   --  10.1*  --  10.2*  HCT 29.6*  --   --  31.8*  --  32.3*  PLT 218  --   --  221  --  255  LABPROT 15.6*  --   --  15.5*  --  15.3*  INR 1.23  --   --  1.22  --  1.20  HEPARINUNFRC  --   < > 0.28* 0.39 0.26*  --   CREATININE 1.30*  --   --  1.27*  --  1.22  < > = values in this interval not displayed.  Estimated Creatinine Clearance: 63.6 mL/min (by C-G formula based on SCr of 1.22 mg/dL).  Medications: Heparin @ 1900 units/hr  Assessment: 70yom continues on heparin for tissue MVR and aflutter. Heparin level down to subtherapeutic again this morning (0.26) on gtt at 1950 units/hr. No issues with line or bleeding reported per RN.  Goal of Therapy:  Heparin level 0.3-0.5 units/ml Monitor platelets by anticoagulation protocol: Yes   Plan:  Increase heparin to 2050 units/hr Will f/u 6 hr heparin level  Sherlon Handing, PharmD, BCPS Clinical pharmacist, pager [phone] 07/21/16 6:00 AM

## 2016-07-21 NOTE — Progress Notes (Signed)
ANTICOAGULATION CONSULT NOTE  Pharmacy Consult for Heparin Indication: tissue MVR / aflutter  Allergies  Allergen Reactions  . Xanax [Alprazolam] Other (See Comments)    Pt feels very weak, tired and feels paralyzed      Patient Measurements: Height: 6' (182.9 cm) Weight: 197 lb 12 oz (89.7 kg) IBW/kg (Calculated) : 77.6   Vital Signs: Temp: 98.3 F (36.8 C) (10/20 1936) Temp Source: Oral (10/20 1936) BP: 118/69 (10/20 1800) Pulse Rate: 80 (10/20 1800)  Labs:  Recent Labs  07/19/16 0345  07/20/16 0315 07/21/16 0339 07/21/16 0415 07/21/16 1200 07/21/16 2119  HGB 9.6*  --  10.1*  --  10.2*  --   --   HCT 29.6*  --  31.8*  --  32.3*  --   --   PLT 218  --  221  --  255  --   --   LABPROT 15.6*  --  15.5*  --  15.3*  --   --   INR 1.23  --  1.22  --  1.20  --   --   HEPARINUNFRC  --   < > 0.39 0.26*  --  0.10* 0.49  CREATININE 1.30*  --  1.27*  --  1.22  --   --   < > = values in this interval not displayed.  Estimated Creatinine Clearance: 63.6 mL/min (by C-G formula based on SCr of 1.22 mg/dL).  Medications: Heparin @ 2050 units/hr  Assessment: 70yom continues on heparin for tissue MVR and aflutter. Coumadin resumed 10/17 per MD. Now s/p successful TEE guided cardioversion on 10/19. He is in complete heart block w/ temp pacemaker and will need CRRT soon. HL is therapeutic on 2050 units/hr. Order to change rate per previous RPH's note was never entered, so the heparin has been running at 2050 units/hr all day. The level is now therapeutic so will continue at current rate.   Goal of Therapy:  Heparin level 0.3-0.5 units/ml Monitor platelets by anticoagulation protocol: Yes   Plan:  -Continue heparin at 2050 units/hr -Daily HL, CBC -Next level with AM labs    Hughes Better, PharmD, BCPS Clinical Pharmacist 07/21/2016 9:51 PM

## 2016-07-21 NOTE — Progress Notes (Signed)
ANTICOAGULATION CONSULT NOTE - Follow Up Consult  Pharmacy Consult for Heparin Indication: tissue MVR / aflutter  Allergies  Allergen Reactions  . Xanax [Alprazolam] Other (See Comments)    Pt feels very weak, tired and feels paralyzed      Patient Measurements: Height: 6' (182.9 cm) Weight: 197 lb 12 oz (89.7 kg) IBW/kg (Calculated) : 77.6   Vital Signs: Temp: 98.3 F (36.8 C) (10/20 0841) Temp Source: Oral (10/20 0841) BP: 114/63 (10/20 1200) Pulse Rate: 80 (10/20 1200)  Labs:  Recent Labs  07/19/16 0345  07/20/16 0315 07/21/16 0339 07/21/16 0415 07/21/16 1200  HGB 9.6*  --  10.1*  --  10.2*  --   HCT 29.6*  --  31.8*  --  32.3*  --   PLT 218  --  221  --  255  --   LABPROT 15.6*  --  15.5*  --  15.3*  --   INR 1.23  --  1.22  --  1.20  --   HEPARINUNFRC  --   < > 0.39 0.26*  --  0.10*  CREATININE 1.30*  --  1.27*  --  1.22  --   < > = values in this interval not displayed.  Estimated Creatinine Clearance: 63.6 mL/min (by C-G formula based on SCr of 1.22 mg/dL).  Medications: Heparin @ 2050 units/hr  Assessment: 70yom continues on heparin for tissue MVR and aflutter. Coumadin resumed 10/17 per MD. Now s/p successful TEE guided cardioversion on 10/19. He is in complete heart block w/ temp pacemaker and will need CRT soon. Heparin level SUBtherapeutic even after dose increase. The level is actually even lower. I spoke to the RN and heparin has not been stopped and there are no issues with the line. Patient is at high risk for clot so will go ahead and increase dose conservatively. CBC stable.  Goal of Therapy:  Heparin level 0.3-0.5 units/ml Monitor platelets by anticoagulation protocol: Yes   Plan:  1) Increase heparin to 2200 units/hr 2) 6 hour heparin level 2) Daily heparin level and CBC  Melburn Popper, PharmD Clinical Pharmacy Resident Pager: [phone] 07/21/16 2:30 PM

## 2016-07-21 NOTE — Progress Notes (Signed)
Speech Language Pathology Treatment: Dysphagia  Patient Details Name: Christian Bowman MRN: [id number] DOB: 10/19/1945 Today's Date: 07/21/2016 Time: [id number] SLP Time Calculation (min) (ACUTE ONLY): 26 min  Assessment / Plan / Recommendation Clinical Impression  SLP provided skilled observation during breakfast meal. Pt and wife report one episode of difficulty since prior SLP visit, but otherwise no overt coughing. Throughout intake pt did exhibit intermittent, delayed throat clearing, but his vocal quality remained clear. Wife reports that at home PTA he also had frequent throat clearing. Suspect that he is likely getting close to his baseline swallowing function, which likely includes a mild more chronic cervical esophageal dysphagia given results of MBS. He has remained afebrile with clear but diminished lung sounds. Recommend advancement to regular textures and thin liquids, but still encouraged pt to select foods that are a little softer or more moist. SLP also provided education about recommend compensatory strategies to utilize to mitigate aspiration risk. Will f/u briefly for tolerance of diet advancement.   HPI HPI: Patient is a 70 year old male who underwent redo coronary artery bypass grafting 2 and mitral valve replacement on 10/10.  Bleeding noted after surgery, returned for surgical reexploration. Extubated 10/11. Noted to have some coughing with liquids that evening.       SLP Plan  Continue with current plan of care     Recommendations  Diet recommendations: Regular;Thin liquid Liquids provided via: Cup;No straw Medication Administration: Whole meds with puree Supervision: Patient able to self feed;Full supervision/cueing for compensatory strategies Compensations: Slow rate;Small sips/bites;Multiple dry swallows after each bite/sip;Follow solids with liquid;Clear throat intermittently Postural Changes and/or Swallow Maneuvers: Seated upright 90 degrees;Upright 30-60 min after  meal                Oral Care Recommendations: Oral care BID Follow up Recommendations: Inpatient Rehab Plan: Continue with current plan of care       GO                [name] 07/21/2016, 9:56 AM  [name], M.A. CCC-SLP [phone]

## 2016-07-22 LAB — COOXEMETRY PANEL
Carboxyhemoglobin: 2.1 % — ABNORMAL HIGH (ref 0.5–1.5)
Methemoglobin: 0.5 % (ref 0.0–1.5)
O2 Saturation: 54.3 %
TOTAL HEMOGLOBIN: 10.4 g/dL — AB (ref 12.0–16.0)

## 2016-07-22 LAB — BASIC METABOLIC PANEL
ANION GAP: 6 (ref 5–15)
BUN: 15 mg/dL (ref 6–20)
CALCIUM: 8.2 mg/dL — AB (ref 8.9–10.3)
CO2: 30 mmol/L (ref 22–32)
Chloride: 99 mmol/L — ABNORMAL LOW (ref 101–111)
Creatinine, Ser: 1.19 mg/dL (ref 0.61–1.24)
GFR calc Af Amer: >60 mL/min (ref >60)
GLUCOSE: 137 mg/dL — AB (ref 65–99)
Potassium: 4 mmol/L (ref 3.5–5.1)
SODIUM: 135 mmol/L (ref 135–145)

## 2016-07-22 LAB — GLUCOSE, CAPILLARY
GLUCOSE-CAPILLARY: 178 mg/dL — AB (ref 65–99)
GLUCOSE-CAPILLARY: 142 mg/dL — AB (ref 65–99)
Glucose-Capillary: 198 mg/dL — ABNORMAL HIGH (ref 65–99)
Glucose-Capillary: 97 mg/dL (ref 65–99)

## 2016-07-22 LAB — CBC
HCT: 30.8 % — ABNORMAL LOW (ref 39.0–52.0)
Hemoglobin: 9.8 g/dL — ABNORMAL LOW (ref 13.0–17.0)
MCH: 28 pg (ref 26.0–34.0)
MCHC: 31.8 g/dL (ref 30.0–36.0)
MCV: 88 fL (ref 78.0–100.0)
PLATELETS: 240 10*3/uL (ref 150–400)
RBC: 3.5 MIL/uL — AB (ref 4.22–5.81)
RDW: 15.1 % (ref 11.5–15.5)
WBC: 14.9 10*3/uL — ABNORMAL HIGH (ref 4.0–10.5)

## 2016-07-22 LAB — HEPARIN LEVEL (UNFRACTIONATED): HEPARIN UNFRACTIONATED: 0.44 [IU]/mL (ref 0.30–0.70)

## 2016-07-22 MED ORDER — WARFARIN SODIUM 5 MG PO TABS
10.0000 mg | ORAL_TABLET | Freq: Every day | ORAL | Status: DC
  Administered 2016-07-22: 10 mg via ORAL
  Filled 2016-07-22: qty 2

## 2016-07-22 MED ORDER — FUROSEMIDE 10 MG/ML IJ SOLN
40.0000 mg | Freq: Two times a day (BID) | INTRAMUSCULAR | Status: DC
  Administered 2016-07-22 – 2016-07-23 (×3): 40 mg via INTRAVENOUS
  Filled 2016-07-22 (×3): qty 4

## 2016-07-22 MED ORDER — POTASSIUM CHLORIDE CRYS ER 20 MEQ PO TBCR
40.0000 meq | EXTENDED_RELEASE_TABLET | Freq: Two times a day (BID) | ORAL | Status: DC
  Administered 2016-07-22: 40 meq via ORAL
  Filled 2016-07-22: qty 2

## 2016-07-22 MED ORDER — POTASSIUM CHLORIDE 20 MEQ/15ML (10%) PO SOLN
40.0000 meq | Freq: Two times a day (BID) | ORAL | Status: DC
  Administered 2016-07-22 – 2016-07-23 (×2): 40 meq via ORAL
  Filled 2016-07-22 (×2): qty 30

## 2016-07-22 NOTE — Progress Notes (Signed)
      [address]       [place] [address]             [phone]        CARDIOTHORACIC SURGERY PROGRESS NOTE   R3 Days Post-Op Procedure(s) (LRB): CARDIOVERSION (N/A) TRANSESOPHAGEAL ECHOCARDIOGRAM (TEE) (N/A)  Subjective: Feels a little better today.  No compaints.  Objective: Vital signs: BP Readings from Last 1 Encounters:  07/22/16 120/61   Pulse Readings from Last 1 Encounters:  07/22/16 80   Resp Readings from Last 1 Encounters:  07/22/16 (!) 26   Temp Readings from Last 1 Encounters:  07/22/16 98 F (36.7 C) (Oral)    Hemodynamics: CVP:  [1 mmHg-11 mmHg] 4 mmHg  Mixed venous co-ox 54.3%   Physical Exam:  Rhythm:   AV paced  Breath sounds: Clear  Heart sounds:  RRR   Incisions:  Clean and dry  Abdomen:  Soft, non-distended, non-tender  Extremities:  Warm, well-perfused    Intake/Output from previous day: 10/20 0701 - 10/21 0700 In: 1836.4 [P.O.:1220; I.V.:616.4] Out: 2125 [Urine:2125] Intake/Output this shift: Total I/O In: 348.4 [P.O.:240; I.V.:108.4] Out: -   Lab Results:  CBC: Recent Labs  07/21/16 0415 07/22/16 0425  WBC 14.2* 14.9*  HGB 10.2* 9.8*  HCT 32.3* 30.8*  PLT 255 240    BMET:  Recent Labs  07/21/16 0415 07/22/16 0425  NA 136 135  K 4.1 4.0  CL 99* 99*  CO2 30 30  GLUCOSE 148* 137*  BUN 19 15  CREATININE 1.22 1.19  CALCIUM 8.2* 8.2*     PT/INR:   Recent Labs  07/21/16 0415  LABPROT 15.3*  INR 1.20    CBG (last 3)   Recent Labs  07/21/16 1539 07/21/16 2153 07/22/16 0845  GLUCAP 173* 159* 198*    ABG    Component Value Date/Time   PHART 7.373 07/12/2016 1531   PCO2ART 40.1 07/12/2016 1531   PO2ART 67.0 (L) 07/12/2016 1531   HCO3 23.3 07/12/2016 1531   TCO2 25 07/12/2016 1531   ACIDBASEDEF 2.0 07/12/2016 1531   O2SAT 54.3 07/22/2016 0410    CXR: n/a  Assessment/Plan: S/P Procedure(s) (LRB): CARDIOVERSION (N/A) TRANSESOPHAGEAL ECHOCARDIOGRAM (TEE)  (N/A)  Stable Continue plan as outlined by AHF team INR still hasn't risen - increase Coumadin dose further  Rexene Alberts, MD 07/22/2016 12:04 PM

## 2016-07-22 NOTE — Progress Notes (Signed)
Patient ID: Christian Bowman, male   DOB: 1946/07/09, 70 y.o.   MRN: [id number]    Advanced Heart Failure Rounding Note   Subjective:    S/P CABG + bioprosthetic MVR on 10/10  Milrinone stopped 10/18 prior to DCCV (atrial flutter).  TEE-guided DCCV 10/18.  Underlying rhythm remained CHB, temporary wire changed to DDD mode by Dr Roxy Manns.    Co-ox low yesterday, milrinone restarted and increased to 0.25.  Co-ox remains 54% today.  Feels better but still with slight stomach upset. CVP 3. On po lasix. Feels bloated.   AV pacing on tele  TEE (10/18): EF 20-25%, MV bioprosthesis looks ok, moderately decreased RV systolic function.   Objective:   Weight Range:  Vital Signs:   Temp:  [97.7 F (36.5 C)-98.7 F (37.1 C)] 98.7 F (37.1 C) (10/21 1612) Pulse Rate:  [79-81] 79 (10/21 1400) Resp:  [15-28] 28 (10/21 1400) BP: (91-132)/(49-89) 129/81 (10/21 1400) SpO2:  [92 %-97 %] 97 % (10/21 1400) Weight:  [88.7 kg (195 lb 9.6 oz)] 88.7 kg (195 lb 9.6 oz) (10/21 0600) Last BM Date: 07/21/16  Weight change: Filed Weights   07/20/16 0500 07/21/16 0600 07/22/16 0600  Weight: 87.5 kg (192 lb 14.4 oz) 89.7 kg (197 lb 12 oz) 88.7 kg (195 lb 9.6 oz)    Intake/Output:   Intake/Output Summary (Last 24 hours) at 07/22/16 1625 Last data filed at 07/22/16 1500  Gross per 24 hour  Intake           1163.3 ml  Output             2325 ml  Net          -1161.7 ml    Physical Exam: CVP 3 General: No resp difficulty. Sitting in  chair. HEENT: normal except R neck cyst.  Neck: supple. JVP flat. Carotids 2+ bilat; no bruits. No lymphadenopathy or thryomegaly appreciated. Cor: PMI nondisplaced. Regular rate & rhythm. No rubs, gallops or murmurs. Lungs: clear Abdomen: soft, nontender, + distended. No hepatosplenomegaly. No bruits or masses. Good bowel sounds. Extremities: no cyanosis, clubbing, rash, R and LLE 2+ edema ted hose in place  Neuro: alert & orientedx3, cranial nerves grossly intact. moves all  4 extremities w/o difficulty. Affect pleasant  Telemetry:  AV pacing.   Labs: Basic Metabolic Panel:  Recent Labs Lab 07/16/16 0300 07/17/16 0335  07/18/16 0227 07/19/16 0345 07/20/16 0315 07/21/16 0415 07/22/16 0425  NA 141 142  < > 140 136 138 136 135  K 3.0* 3.3*  < > 3.1* 3.7 4.3 4.1 4.0  CL 105 107  < > 104 99* 102 99* 99*  CO2 27 28  < > 29 30 28 30 30   GLUCOSE 146* 142*  < > 73 134* 147* 148* 137*  BUN 21* 22*  < > 16 14 18 19 15   CREATININE 1.32* 1.29*  < > 1.25* 1.30* 1.27* 1.22 1.19  CALCIUM 8.2* 8.2*  < > 7.8* 8.2* 8.4* 8.2* 8.2*  MG 2.0 2.1  --   --   --   --   --   --   PHOS 2.8 2.9  --   --   --   --   --   --   < > = values in this interval not displayed.  Liver Function Tests:  Recent Labs Lab 07/16/16 0300 07/17/16 0335  AST 33 48*  ALT 29 36  ALKPHOS 51 48  BILITOT 1.7* 1.8*  PROT 5.2* 5.0*  ALBUMIN 2.5* 2.4*   No results for input(s): LIPASE, AMYLASE in the last 168 hours. No results for input(s): AMMONIA in the last 168 hours.  CBC:  Recent Labs Lab 07/17/16 0335 07/18/16 0227 07/19/16 0345 07/20/16 0315 07/21/16 0415 07/22/16 0425  WBC 9.8 11.4* 11.1* 13.1* 14.2* 14.9*  NEUTROABS 7.0  --   --   --   --   --   HGB 9.7* 10.1* 9.6* 10.1* 10.2* 9.8*  HCT 30.1* 30.3* 29.6* 31.8* 32.3* 30.8*  MCV 86.5 85.8 88.1 88.8 89.2 88.0  PLT 152 191 218 221 255 240    Cardiac Enzymes: No results for input(s): CKTOTAL, CKMB, CKMBINDEX, TROPONINI in the last 168 hours.  BNP: BNP (last 3 results) No results for input(s): BNP in the last 8760 hours.  ProBNP (last 3 results) No results for input(s): PROBNP in the last 8760 hours.    Other results:  Imaging: No results found.   Medications:     Scheduled Medications: . amiodarone  200 mg Oral BID  . bisacodyl  10 mg Oral Daily   Or  . bisacodyl  10 mg Rectal Daily  . digoxin  0.125 mg Oral Daily  . feeding supplement (ENSURE ENLIVE)  237 mL Oral BID BM  . furosemide  40 mg Oral  BID  . insulin aspart  0-15 Units Subcutaneous TID WC  . losartan  25 mg Oral BID  . potassium chloride  40 mEq Oral BID  . rosuvastatin  10 mg Oral QHS  . spironolactone  25 mg Oral Daily  . warfarin  10 mg Oral q1800  . Warfarin - Physician Dosing Inpatient   Does not apply q1800    Infusions: . sodium chloride Stopped (07/19/16 1344)  . heparin 2,050 Units/hr (07/22/16 0800)  . milrinone 0.25 mcg/kg/min (07/22/16 0800)    PRN Medications: fentaNYL (SUBLIMAZE) injection, Influenza vac split quadrivalent PF, metoprolol, ondansetron (ZOFRAN) IV, sodium chloride flush, sodium chloride flush   Assessment/Plan/Discussion   1. CAD: s/p redo CABG with MVR.  Restarted statin.  2. Acute on chronic systolic CHF: Ischemic cardiomyopathy.  Pre-op EF 35-40%.  TEE 10/18 with EF 20-25%, moderate RV dysfunction.  Low co-ox suggestive of low output post-op, weaned off milrinone but co-ox down to 51% and felt nauseated, so milrinone restarted. Now at 0.125.  Co-ox 54% today. He is AV pacing with underlying CHB - Continue milrinone 0.25 and digoxin - CVP low but weight still up 15 pounds from baseline (180 -> 195) - Will switch back to po lasix 40 IV bid - Continue losartan 25 bid and spironolactone.  Suspect we can eventually transition to [facility].  - No beta blocker for now with low output.  - Plan for CRT-D when INR > 2 and we can stop heparin (hopefully Monday, Dr. Curt Bears has seen).  - Hopefully with CRTwe can wean him off milrinone.  3. Bradycardia: Underlying CHB.  Currently AV-pacing.  Plan for CRT-D Monday.   4. Atrial flutter: Post-op.  DCCV on 10/18,  in NSR today. - Continue heparin gtt with warfarin until INR > 2.  - Continue  amiodarone 200 mg bid now that we have finalized plan for pacing.  5. S/p bioprosthetic MVR: Stable on TEE 10/18.    Length of Stay: [place] 07/22/2016, 4:25 PM  Advanced Heart Failure Team Pager [phone] (M-F; 7a - 4p)  Please contact  [facility] for night-coverage after hours (4p -7a ) and weekends on [email or web address]

## 2016-07-22 NOTE — Progress Notes (Signed)
[facility] BRIEF SICU PROGRESS NOTE  3 Days Post-Op  S/P Procedure(s) (LRB): CARDIOVERSION (N/A) TRANSESOPHAGEAL ECHOCARDIOGRAM (TEE) (N/A)   Stable day  Plan: Continue current plan  Rexene Alberts, MD 07/22/2016 6:58 PM

## 2016-07-22 NOTE — Progress Notes (Signed)
[place] for Heparin> warfarin Indication: tissue MVR / aflutter  Allergies  Allergen Reactions  . Xanax [Alprazolam] Other (See Comments)    Pt feels very weak, tired and feels paralyzed      Patient Measurements: Height: 6' (182.9 cm) Weight: 195 lb 9.6 oz (88.7 kg) IBW/kg (Calculated) : 77.6   Vital Signs: Temp: 97.7 F (36.5 C) (10/21 1236) Temp Source: Oral (10/21 1236) BP: 113/71 (10/21 1200) Pulse Rate: 80 (10/21 1200)  Labs:  Recent Labs  07/20/16 0315  07/21/16 0415 07/21/16 1200 07/21/16 2119 07/22/16 0425 07/22/16 0538  HGB 10.1*  --  10.2*  --   --  9.8*  --   HCT 31.8*  --  32.3*  --   --  30.8*  --   PLT 221  --  255  --   --  240  --   LABPROT 15.5*  --  15.3*  --   --   --   --   INR 1.22  --  1.20  --   --   --   --   HEPARINUNFRC 0.39  < >  --  0.10* 0.49  --  0.44  CREATININE 1.27*  --  1.22  --   --  1.19  --   < > = values in this interval not displayed.  Estimated Creatinine Clearance: 65.2 mL/min (by C-G formula based on SCr of 1.19 mg/dL).    Assessment: 70yom continues on heparin for tissue MVR and aflutter. Coumadin resumed 10/17 per MD. Now s/p successful TEE guided cardioversion on 10/19. He is in complete heart block w/ temp pacemaker and will need CRRT soon. HL 0.44  is therapeutic on 2050 units/hr.  CBC stable, no bleeding noted  Goal of Therapy:  Heparin level 0.3-0.5 units/ml Monitor platelets by anticoagulation protocol: Yes   Plan:  -Continue heparin at 2050 units/hr -Daily HL, CBC -Next level with AM labs Warfarin 10mg  x1 per MD    Bonnita Nasuti Pharm.D. CPP, BCPS Clinical Pharmacist [phone] 07/22/2016 3:17 PM

## 2016-07-23 LAB — URINALYSIS W MICROSCOPIC (NOT AT ARMC)
BILIRUBIN URINE: NEGATIVE
GLUCOSE, UA: NEGATIVE mg/dL
Hgb urine dipstick: NEGATIVE
Ketones, ur: NEGATIVE mg/dL
Leukocytes, UA: NEGATIVE
NITRITE: NEGATIVE
PH: 6.5 (ref 5.0–8.0)
Protein, ur: NEGATIVE mg/dL
SPECIFIC GRAVITY, URINE: 1.01 (ref 1.005–1.030)

## 2016-07-23 LAB — COMPREHENSIVE METABOLIC PANEL
ALBUMIN: 2.3 g/dL — AB (ref 3.5–5.0)
ALK PHOS: 57 U/L (ref 38–126)
ALT: 37 U/L (ref 17–63)
AST: 30 U/L (ref 15–41)
Anion gap: 7 (ref 5–15)
BILIRUBIN TOTAL: 1 mg/dL (ref 0.3–1.2)
BUN: 14 mg/dL (ref 6–20)
CALCIUM: 8.3 mg/dL — AB (ref 8.9–10.3)
CO2: 30 mmol/L (ref 22–32)
CREATININE: 1.28 mg/dL — AB (ref 0.61–1.24)
Chloride: 98 mmol/L — ABNORMAL LOW (ref 101–111)
GFR calc Af Amer: >60 mL/min (ref >60)
GFR calc non Af Amer: 56 mL/min — ABNORMAL LOW (ref >60)
GLUCOSE: 153 mg/dL — AB (ref 65–99)
Potassium: 4.4 mmol/L (ref 3.5–5.1)
SODIUM: 135 mmol/L (ref 135–145)
TOTAL PROTEIN: 5.2 g/dL — AB (ref 6.5–8.1)

## 2016-07-23 LAB — GLUCOSE, CAPILLARY
GLUCOSE-CAPILLARY: 154 mg/dL — AB (ref 65–99)
GLUCOSE-CAPILLARY: 178 mg/dL — AB (ref 65–99)
Glucose-Capillary: 167 mg/dL — ABNORMAL HIGH (ref 65–99)
Glucose-Capillary: 184 mg/dL — ABNORMAL HIGH (ref 65–99)

## 2016-07-23 LAB — CBC
HCT: 30.5 % — ABNORMAL LOW (ref 39.0–52.0)
Hemoglobin: 9.7 g/dL — ABNORMAL LOW (ref 13.0–17.0)
MCH: 28.1 pg (ref 26.0–34.0)
MCHC: 31.8 g/dL (ref 30.0–36.0)
MCV: 88.4 fL (ref 78.0–100.0)
PLATELETS: 215 10*3/uL (ref 150–400)
RBC: 3.45 MIL/uL — AB (ref 4.22–5.81)
RDW: 15.3 % (ref 11.5–15.5)
WBC: 14.4 10*3/uL — ABNORMAL HIGH (ref 4.0–10.5)

## 2016-07-23 LAB — COOXEMETRY PANEL
Carboxyhemoglobin: 1.2 % (ref 0.5–1.5)
Methemoglobin: 0.9 % (ref 0.0–1.5)
O2 SAT: 65.6 %
Total hemoglobin: 13 g/dL (ref 12.0–16.0)

## 2016-07-23 LAB — PROTIME-INR
INR: 1.58
PROTHROMBIN TIME: 19 s — AB (ref 11.4–15.2)

## 2016-07-23 LAB — HEPARIN LEVEL (UNFRACTIONATED): Heparin Unfractionated: 0.59 IU/mL (ref 0.30–0.70)

## 2016-07-23 LAB — PREALBUMIN: Prealbumin: 13.7 mg/dL — ABNORMAL LOW (ref 18–38)

## 2016-07-23 MED ORDER — WARFARIN SODIUM 7.5 MG PO TABS
7.5000 mg | ORAL_TABLET | Freq: Every day | ORAL | Status: DC
  Administered 2016-07-23: 7.5 mg via ORAL
  Filled 2016-07-23: qty 1

## 2016-07-23 NOTE — Progress Notes (Signed)
Patient ID: Christian Bowman, male   DOB: Oct 01, 1946, 70 y.o.   MRN: [id number]    Advanced Heart Failure Rounding Note   Subjective:    S/P CABG + bioprosthetic MVR on 10/10  Milrinone stopped 10/18 prior to DCCV (atrial flutter).  TEE-guided DCCV 10/18.  Underlying rhythm remained CHB, temporary wire changed to DDD mode by Dr Roxy Manns.    Co-ox low on 10/20, milrinone restarted and increased to 0.25.  Co-ox up to 65% today.  CVP was low yesterday but weight up significantly so switched back to IV lasix. Weight down 3 pounds. Feels better. Less bloated. Creatinine relatively stable. CVP 5    AV pacing on tele  TEE (10/18): EF 20-25%, MV bioprosthesis looks ok, moderately decreased RV systolic function.   Objective:   Weight Range:  Vital Signs:   Temp:  [97.7 F (36.5 C)-98.7 F (37.1 C)] 98.6 F (37 C) (10/22 1216) Pulse Rate:  [79-81] 80 (10/22 1000) Resp:  [10-28] 19 (10/22 1000) BP: (94-147)/(53-86) 94/53 (10/22 1000) SpO2:  [88 %-100 %] 95 % (10/22 1000) Weight:  [87.5 kg (192 lb 12.8 oz)] 87.5 kg (192 lb 12.8 oz) (10/22 0600) Last BM Date: 07/21/16  Weight change: Filed Weights   07/21/16 0600 07/22/16 0600 07/23/16 0600  Weight: 89.7 kg (197 lb 12 oz) 88.7 kg (195 lb 9.6 oz) 87.5 kg (192 lb 12.8 oz)    Intake/Output:   Intake/Output Summary (Last 24 hours) at 07/23/16 1229 Last data filed at 07/23/16 1100  Gross per 24 hour  Intake            873.3 ml  Output             3685 ml  Net          -2811.7 ml    Physical Exam: CVP 5 General: No resp difficulty. Sitting in  chair. HEENT: normal except R neck cyst.  Neck: supple. JVP flat. Carotids 2+ bilat; no bruits. No lymphadenopathy or thryomegaly appreciated. Cor: PMI nondisplaced. Regular rate & rhythm. No rubs, gallops or murmurs. Lungs: clear Abdomen: soft, nontender, + distended. No hepatosplenomegaly. No bruits or masses. Good bowel sounds. Extremities: no cyanosis, clubbing, rash, R and LLE 2+ edema ted hose  in place  Neuro: alert & orientedx3, cranial nerves grossly intact. moves all 4 extremities w/o difficulty. Affect pleasant  Telemetry:  AV pacing.   Labs: Basic Metabolic Panel:  Recent Labs Lab 07/17/16 0335  07/19/16 0345 07/20/16 0315 07/21/16 0415 07/22/16 0425 07/23/16 0449  NA 142  < > 136 138 136 135 135  K 3.3*  < > 3.7 4.3 4.1 4.0 4.4  CL 107  < > 99* 102 99* 99* 98*  CO2 28  < > 30 28 30 30 30   GLUCOSE 142*  < > 134* 147* 148* 137* 153*  BUN 22*  < > 14 18 19 15 14   CREATININE 1.29*  < > 1.30* 1.27* 1.22 1.19 1.28*  CALCIUM 8.2*  < > 8.2* 8.4* 8.2* 8.2* 8.3*  MG 2.1  --   --   --   --   --   --   PHOS 2.9  --   --   --   --   --   --   < > = values in this interval not displayed.  Liver Function Tests:  Recent Labs Lab 07/17/16 0335 07/23/16 0449  AST 48* 30  ALT 36 37  ALKPHOS 48 57  BILITOT 1.8* 1.0  PROT 5.0* 5.2*  ALBUMIN 2.4* 2.3*   No results for input(s): LIPASE, AMYLASE in the last 168 hours. No results for input(s): AMMONIA in the last 168 hours.  CBC:  Recent Labs Lab 07/17/16 0335  07/19/16 0345 07/20/16 0315 07/21/16 0415 07/22/16 0425 07/23/16 0449  WBC 9.8  < > 11.1* 13.1* 14.2* 14.9* 14.4*  NEUTROABS 7.0  --   --   --   --   --   --   HGB 9.7*  < > 9.6* 10.1* 10.2* 9.8* 9.7*  HCT 30.1*  < > 29.6* 31.8* 32.3* 30.8* 30.5*  MCV 86.5  < > 88.1 88.8 89.2 88.0 88.4  PLT 152  < > 218 221 255 240 215  < > = values in this interval not displayed.  Cardiac Enzymes: No results for input(s): CKTOTAL, CKMB, CKMBINDEX, TROPONINI in the last 168 hours.  BNP: BNP (last 3 results) No results for input(s): BNP in the last 8760 hours.  ProBNP (last 3 results) No results for input(s): PROBNP in the last 8760 hours.    Other results:  Imaging: No results found.   Medications:     Scheduled Medications: . amiodarone  200 mg Oral BID  . bisacodyl  10 mg Oral Daily   Or  . bisacodyl  10 mg Rectal Daily  . digoxin  0.125 mg  Oral Daily  . feeding supplement (ENSURE ENLIVE)  237 mL Oral BID BM  . furosemide  40 mg Intravenous BID  . insulin aspart  0-15 Units Subcutaneous TID WC  . losartan  25 mg Oral BID  . potassium chloride  40 mEq Oral BID  . rosuvastatin  10 mg Oral QHS  . spironolactone  25 mg Oral Daily  . warfarin  7.5 mg Oral q1800  . Warfarin - Physician Dosing Inpatient   Does not apply q1800    Infusions: . sodium chloride Stopped (07/19/16 1344)  . heparin 2,050 Units/hr (07/23/16 0600)  . milrinone 0.25 mcg/kg/min (07/23/16 0600)    PRN Medications: fentaNYL (SUBLIMAZE) injection, Influenza vac split quadrivalent PF, metoprolol, ondansetron (ZOFRAN) IV, sodium chloride flush, sodium chloride flush   Assessment/Plan/Discussion    1. CAD: s/p redo CABG with MVR.  Restarted statin.  2. Acute on chronic systolic CHF: Ischemic cardiomyopathy.  Pre-op EF 35-40%.  TEE 10/18 with EF 20-25%, moderate RV dysfunction.  Low co-ox suggestive of low output post-op, weaned off milrinone but co-ox down to 51% and felt nauseated, so milrinone restarted. Now at 0.25  Co-ox 65% today. He is AV pacing with underlying CHB - Continue milrinone 0.25 and digoxin - CVP remains low but weight still up about 12 pounds from baseline (180 -> 195 -> 192) - Will continue lasix 40 IV bid - Continue losartan 25 bid and spironolactone.  Suspect we can eventually transition to [facility].  - No beta blocker for now with low output.  - Plan for CRT-D when INR > 2 and we can stop heparin (hopefully Monday, Dr. Curt Bears has seen).  - Hopefully with CRT we can wean him off milrinone.  3. Bradycardia: Underlying CHB.  Currently AV-pacing.  Plan for CRT-D Monday.   4. Atrial flutter: Post-op.  DCCV on 10/18,  in NSR today. - Continue heparin gtt with warfarin until INR > 2.  - Continue  amiodarone 200 mg bid now that we have finalized plan for pacing.  5. S/p bioprosthetic MVR: Stable on TEE 10/18.    Length of Stay:  12  Bensimhon,  [name] 07/23/2016, 12:29 PM  Advanced Heart Failure Team Pager [phone] (M-F; 7a - 4p)  Please contact [facility] for night-coverage after hours (4p -7a ) and weekends on [email or web address]

## 2016-07-23 NOTE — Progress Notes (Signed)
      [address]       [place] [address]             [phone]        CARDIOTHORACIC SURGERY PROGRESS NOTE  R12DaysPost-OpS/P Procedure(s) (LRB): REDO CORONARY ARTERY BYPASS GRAFTING (CABG) x two using left leg greater saphenous vein harvested endoscopically-SVG to PDA -SVG to RAMUS INTERMEDIATE (N/A) TRANSESOPHAGEAL ECHOCARDIOGRAM (TEE) (N/A) MITRAL VALVE (MV) REPLACEMENT (N/A)   R4 Days Post-Op Procedure(s) (LRB): CARDIOVERSION (N/A) TRANSESOPHAGEAL ECHOCARDIOGRAM (TEE) (N/A)  Subjective: Feels better today.  No compaints  Objective: Vital signs: BP Readings from Last 1 Encounters:  07/23/16 (!) 94/53   Pulse Readings from Last 1 Encounters:  07/23/16 80   Resp Readings from Last 1 Encounters:  07/23/16 19   Temp Readings from Last 1 Encounters:  07/23/16 98.6 F (37 C) (Oral)    Hemodynamics: CVP:  [3 mmHg] 3 mmHg  Mixed venous co-ox 65.6%   Physical Exam:  Rhythm:   AV paced  Breath sounds: clear  Heart sounds:  RRR  Incisions:  Clean and dry  Abdomen:  Soft, non-distended, non-tender  Extremities:  Warm, well-perfused, mild LE edema   Intake/Output from previous day: 10/21 0701 - 10/22 0700 In: 1167.5 [P.O.:490; I.V.:677.5] Out: 3150 [Urine:3150] Intake/Output this shift: Total I/O In: 60.8 [I.V.:60.8] Out: -   Lab Results:  CBC: Recent Labs  07/22/16 0425 07/23/16 0449  WBC 14.9* 14.4*  HGB 9.8* 9.7*  HCT 30.8* 30.5*  PLT 240 215    BMET:  Recent Labs  07/22/16 0425 07/23/16 0449  NA 135 135  K 4.0 4.4  CL 99* 98*  CO2 30 30  GLUCOSE 137* 153*  BUN 15 14  CREATININE 1.19 1.28*  CALCIUM 8.2* 8.3*     PT/INR:   Recent Labs  07/23/16 0450  LABPROT 19.0*  INR 1.58    CBG (last 3)   Recent Labs  07/22/16 1609 07/22/16 2056 07/23/16 0837  GLUCAP 142* 97 154*    ABG    Component Value Date/Time   PHART 7.373 07/12/2016 1531   PCO2ART 40.1 07/12/2016 1531   PO2ART 67.0 (L)  07/12/2016 1531   HCO3 23.3 07/12/2016 1531   TCO2 25 07/12/2016 1531   ACIDBASEDEF 2.0 07/12/2016 1531   O2SAT 65.6 07/23/2016 0750    CXR: n/a  Assessment/Plan:  Clinically remains very stable on milrinone @ 0.25 with CVP 3 and co-ox up to 65.6% today Diuresing better I/O's negative 2 liters yesterday, weight down 3 lbs INR trending up WBC trending down   Plan per advanced heart failure team and EPS  Await placement of BiV pacer ICD  Mobilize  Coumadin 7.5 mg tonight and continue heparin  Rexene Alberts, MD 07/23/2016 11:38 AM

## 2016-07-23 NOTE — Progress Notes (Signed)
TCTS BRIEF SICU PROGRESS NOTE  4 Days Post-Op  S/P Procedure(s) (LRB): CARDIOVERSION (N/A) TRANSESOPHAGEAL ECHOCARDIOGRAM (TEE) (N/A)   Stable day  Plan: Continue current plan  Rexene Alberts, MD 07/23/2016 7:14 PM

## 2016-07-23 NOTE — Progress Notes (Signed)
Attempted to walk patient.  Upon standing, he got very diaphoretic and unstable on feet.  He said he felt very dizzy.  BP 122/60.  Pt noted to be fail.

## 2016-07-23 NOTE — Progress Notes (Signed)
[place] for Heparin> warfarin Indication: tissue MVR / aflutter  Allergies  Allergen Reactions  . Xanax [Alprazolam] Other (See Comments)    Pt feels very weak, tired and feels paralyzed      Patient Measurements: Height: 6' (182.9 cm) Weight: 192 lb 12.8 oz (87.5 kg) IBW/kg (Calculated) : 77.6   Vital Signs: Temp: 98.6 F (37 C) (10/22 1216) Temp Source: Oral (10/22 1216) BP: 94/53 (10/22 1000) Pulse Rate: 80 (10/22 1000)  Labs:  Recent Labs  07/21/16 0415  07/21/16 2119 07/22/16 0425 07/22/16 0538 07/23/16 0449 07/23/16 0450  HGB 10.2*  --   --  9.8*  --  9.7*  --   HCT 32.3*  --   --  30.8*  --  30.5*  --   PLT 255  --   --  240  --  215  --   LABPROT 15.3*  --   --   --   --   --  19.0*  INR 1.20  --   --   --   --   --  1.58  HEPARINUNFRC  --   < > 0.49  --  0.44  --  0.59  CREATININE 1.22  --   --  1.19  --  1.28*  --   < > = values in this interval not displayed.  Estimated Creatinine Clearance: 60.6 mL/min (by C-G formula based on SCr of 1.28 mg/dL (H)).    Assessment: 70yom continues on heparin for tissue MVR and aflutter. Coumadin resumed 10/17 per MD. Now s/p successful TEE guided cardioversion on 10/19. He is in complete heart block w/ temp pacemaker and will need CRRT soon. HL 0.59  is therapeutic on 2050 units/hr.  CBC stable, no bleeding noted INR finally moving after boost 1.6  Goal of Therapy:  Heparin level 0.3-0.5 units/ml Monitor platelets by anticoagulation protocol: Yes   Plan:  -Continue heparin at 2050 units/hr -Daily HL, CBC -Next level with AM labs Warfarin 7.5mg  x1 per MD    Bonnita Nasuti Pharm.D. CPP, BCPS Clinical Pharmacist [phone] 07/23/2016 1:48 PM

## 2016-07-24 ENCOUNTER — Inpatient Hospital Stay (HOSPITAL_COMMUNITY): Payer: Medicare Other

## 2016-07-24 DIAGNOSIS — E43 Unspecified severe protein-calorie malnutrition: Secondary | ICD-10-CM | POA: Insufficient documentation

## 2016-07-24 LAB — GLUCOSE, CAPILLARY
GLUCOSE-CAPILLARY: 137 mg/dL — AB (ref 65–99)
GLUCOSE-CAPILLARY: 206 mg/dL — AB (ref 65–99)
Glucose-Capillary: 149 mg/dL — ABNORMAL HIGH (ref 65–99)

## 2016-07-24 LAB — CBC
HEMATOCRIT: 29.7 % — AB (ref 39.0–52.0)
Hemoglobin: 9.6 g/dL — ABNORMAL LOW (ref 13.0–17.0)
MCH: 28.7 pg (ref 26.0–34.0)
MCHC: 32.3 g/dL (ref 30.0–36.0)
MCV: 88.9 fL (ref 78.0–100.0)
PLATELETS: 195 10*3/uL (ref 150–400)
RBC: 3.34 MIL/uL — AB (ref 4.22–5.81)
RDW: 15.1 % (ref 11.5–15.5)
WBC: 14.4 10*3/uL — AB (ref 4.0–10.5)

## 2016-07-24 LAB — COOXEMETRY PANEL
CARBOXYHEMOGLOBIN: 1.3 % (ref 0.5–1.5)
Carboxyhemoglobin: 1.7 % — ABNORMAL HIGH (ref 0.5–1.5)
METHEMOGLOBIN: 1 % (ref 0.0–1.5)
METHEMOGLOBIN: 0.5 % (ref 0.0–1.5)
O2 SAT: 55.6 %
O2 SAT: 62.7 %
TOTAL HEMOGLOBIN: 11.7 g/dL — AB (ref 12.0–16.0)
Total hemoglobin: 11.2 g/dL — ABNORMAL LOW (ref 12.0–16.0)

## 2016-07-24 LAB — BASIC METABOLIC PANEL
Anion gap: 9 (ref 5–15)
BUN: 18 mg/dL (ref 6–20)
CHLORIDE: 96 mmol/L — AB (ref 101–111)
CO2: 30 mmol/L (ref 22–32)
Calcium: 8.4 mg/dL — ABNORMAL LOW (ref 8.9–10.3)
Creatinine, Ser: 1.52 mg/dL — ABNORMAL HIGH (ref 0.61–1.24)
GFR calc Af Amer: 53 mL/min — ABNORMAL LOW (ref >60)
GFR calc non Af Amer: 45 mL/min — ABNORMAL LOW (ref >60)
Glucose, Bld: 197 mg/dL — ABNORMAL HIGH (ref 65–99)
POTASSIUM: 4.1 mmol/L (ref 3.5–5.1)
SODIUM: 135 mmol/L (ref 135–145)

## 2016-07-24 LAB — PROTIME-INR
INR: 1.82
PROTHROMBIN TIME: 21.3 s — AB (ref 11.4–15.2)

## 2016-07-24 LAB — HEPARIN LEVEL (UNFRACTIONATED): Heparin Unfractionated: 0.39 IU/mL (ref 0.30–0.70)

## 2016-07-24 LAB — URINE CULTURE

## 2016-07-24 LAB — CARBOXYHEMOGLOBIN - COOX

## 2016-07-24 LAB — PREALBUMIN: Prealbumin: 15.7 mg/dL — ABNORMAL LOW (ref 18–38)

## 2016-07-24 MED ORDER — WARFARIN SODIUM 5 MG PO TABS
10.0000 mg | ORAL_TABLET | Freq: Every day | ORAL | Status: DC
  Administered 2016-07-24: 10 mg via ORAL
  Filled 2016-07-24: qty 2

## 2016-07-24 MED ORDER — FUROSEMIDE 40 MG PO TABS
40.0000 mg | ORAL_TABLET | Freq: Two times a day (BID) | ORAL | Status: DC
  Administered 2016-07-24 – 2016-07-28 (×8): 40 mg via ORAL
  Filled 2016-07-24 (×8): qty 1

## 2016-07-24 NOTE — Progress Notes (Signed)
INR remains low and requires heparin as was recently cardioverted.  Christian Bowman plan to hold off on CRT-D today and plan for tomorrow assuming that INR is >2 and can stop heparin.  Zemirah Krasinski Curt Bears, MD 07/24/2016 8:03 AM

## 2016-07-24 NOTE — Progress Notes (Addendum)
      [address]       [place] [address]             [phone]        CARDIOTHORACIC SURGERY PROGRESS NOTE  R13DaysPost-OpS/P Procedure(s) (LRB): REDO CORONARY ARTERY BYPASS GRAFTING (CABG) x two using left leg greater saphenous vein harvested endoscopically-SVG to PDA -SVG to RAMUS INTERMEDIATE (N/A) TRANSESOPHAGEAL ECHOCARDIOGRAM (TEE) (N/A) MITRAL VALVE (MV) REPLACEMENT (N/A)   R5 Days Post-Op Procedure(s) (LRB): CARDIOVERSION (N/A) TRANSESOPHAGEAL ECHOCARDIOGRAM (TEE) (N/A)  Subjective: Feels well.  Slept well.  No complaints.  Objective: Vital signs: BP Readings from Last 1 Encounters:  07/24/16 121/75   Pulse Readings from Last 1 Encounters:  07/24/16 79   Resp Readings from Last 1 Encounters:  07/24/16 20   Temp Readings from Last 1 Encounters:  07/24/16 98 F (36.7 C) (Oral)    Hemodynamics: CVP:  [3 mmHg-8 mmHg] 8 mmHg  Mixed venous co-ox 63%%  Physical Exam:  Rhythm:   AV paced  Breath sounds: clear  Heart sounds:  RRR  Incisions:  Clean and dry  Abdomen:  Soft, non-distended, non-tender  Extremities:  Warm, well-perfused    Intake/Output from previous day: 10/22 0701 - 10/23 0700 In: 650.4 [I.V.:650.4] Out: 2085 [Urine:2085] Intake/Output this shift: No intake/output data recorded.  Lab Results:  CBC: Recent Labs  07/23/16 0449 07/24/16 0223  WBC 14.4* 14.4*  HGB 9.7* 9.6*  HCT 30.5* 29.7*  PLT 215 195    BMET:  Recent Labs  07/23/16 0449 07/24/16 0223  NA 135 135  K 4.4 4.1  CL 98* 96*  CO2 30 30  GLUCOSE 153* 197*  BUN 14 18  CREATININE 1.28* 1.52*  CALCIUM 8.3* 8.4*     PT/INR:   Recent Labs  07/24/16 0223  LABPROT 21.3*  INR 1.82    CBG (last 3)   Recent Labs  07/23/16 1214 07/23/16 1632 07/23/16 2115  GLUCAP 167* 184* 178*    ABG    Component Value Date/Time   PHART 7.373 07/12/2016 1531   PCO2ART 40.1 07/12/2016 1531   PO2ART 67.0 (L) 07/12/2016 1531   HCO3 23.3  07/12/2016 1531   TCO2 25 07/12/2016 1531   ACIDBASEDEF 2.0 07/12/2016 1531   O2SAT 62.7 07/24/2016 0342    CXR: Tiny left effusion, otherwise clear  Assessment/Plan:  Clinically stable AV paced w/ stable BP Breathing comfortably w/ O2 sats 98% on 2 L/min via [place] CVP 8 and mixed venous co-ox 63% on milrinone 0.25 Acute on chronic combined systolic and diastolic CHF with expected post-op volume excess, I/O's 1.4 liters negative yesterday and weight stable but BUN/Cr up slightly 18/1.5 Awaiting BiV Pacer - ICD   Plan per advanced heart failure team and EPS  Consider decreasing diuretics and/or Cozaar   Watch renal function closely  Coumadin 10 mg tonight   Rexene Alberts, MD 07/24/2016 7:57 AM

## 2016-07-24 NOTE — Care Management Important Message (Signed)
Important Message  Patient Details  Name: Christian Bowman MRN: [id number] Date of Birth: Dec 28, 1945   Medicare Important Message Given:  Yes    Orbie Pyo 07/24/2016, 12:43 PM

## 2016-07-24 NOTE — Progress Notes (Signed)
      [address]       [place] [address]             [phone]      POD # 13 redo CABG, MVR  No new issues today  For BiV pacer/ ICD tomorrow  Remo Lipps C. Roxan Hockey, MD [facility] [phone]

## 2016-07-24 NOTE — Progress Notes (Signed)
Patient ID: Christian Bowman, male   DOB: 01-10-1946, 70 y.o.   MRN: [id number]    Advanced Heart Failure Rounding Note   Subjective:    S/P CABG + bioprosthetic MVR on 10/10  Milrinone stopped 10/18 prior to DCCV (atrial flutter).  TEE-guided DCCV 10/18.  Underlying rhythm remained CHB, temporary wire changed to DDD mode by Dr Roxy Manns.    Co-ox low on 10/20, milrinone restarted and increased to 0.25.  Co-ox 63% today.  CVP 6-8 today, creatinine up to 1.5.  Walked this morning, felt good.   INR 1.82  AV pacing on tele  TEE (10/18): EF 20-25%, MV bioprosthesis looks ok, moderately decreased RV systolic function.   Objective:   Weight Range:  Vital Signs:   Temp:  [97.5 F (36.4 C)-98.6 F (37 C)] 97.5 F (36.4 C) (10/23 0700) Pulse Rate:  [79-82] 80 (10/23 0800) Resp:  [17-31] 21 (10/23 0800) BP: (94-135)/(53-75) 126/67 (10/23 0800) SpO2:  [91 %-99 %] 97 % (10/23 0800) Weight:  [192 lb 11.2 oz (87.4 kg)] 192 lb 11.2 oz (87.4 kg) (10/23 0600) Last BM Date: 07/21/16  Weight change: Filed Weights   07/22/16 0600 07/23/16 0600 07/24/16 0600  Weight: 195 lb 9.6 oz (88.7 kg) 192 lb 12.8 oz (87.5 kg) 192 lb 11.2 oz (87.4 kg)    Intake/Output:   Intake/Output Summary (Last 24 hours) at 07/24/16 0904 Last data filed at 07/24/16 0800  Gross per 24 hour  Intake            623.3 ml  Output             1835 ml  Net          -1211.7 ml    Physical Exam: CVP 6 General: No resp difficulty. Sitting in  chair. HEENT: normal except R neck cyst.  Neck: supple. JVP flat. Carotids 2+ bilat; no bruits. No lymphadenopathy or thryomegaly appreciated. Cor: PMI nondisplaced. Regular rate & rhythm. No rubs, gallops or murmurs. Lungs: clear Abdomen: soft, nontender, + distended. No hepatosplenomegaly. No bruits or masses. Good bowel sounds. Extremities: no cyanosis, clubbing, rash.  1+ edema to knees bilaterally Neuro: alert & orientedx3, cranial nerves grossly intact. moves all 4 extremities w/o  difficulty. Affect pleasant  Telemetry:  AV pacing.   Labs: Basic Metabolic Panel:  Recent Labs Lab 07/20/16 0315 07/21/16 0415 07/22/16 0425 07/23/16 0449 07/24/16 0223  NA 138 136 135 135 135  K 4.3 4.1 4.0 4.4 4.1  CL 102 99* 99* 98* 96*  CO2 28 30 30 30 30   GLUCOSE 147* 148* 137* 153* 197*  BUN 18 19 15 14 18   CREATININE 1.27* 1.22 1.19 1.28* 1.52*  CALCIUM 8.4* 8.2* 8.2* 8.3* 8.4*    Liver Function Tests:  Recent Labs Lab 07/23/16 0449  AST 30  ALT 37  ALKPHOS 57  BILITOT 1.0  PROT 5.2*  ALBUMIN 2.3*   No results for input(s): LIPASE, AMYLASE in the last 168 hours. No results for input(s): AMMONIA in the last 168 hours.  CBC:  Recent Labs Lab 07/20/16 0315 07/21/16 0415 07/22/16 0425 07/23/16 0449 07/24/16 0223  WBC 13.1* 14.2* 14.9* 14.4* 14.4*  HGB 10.1* 10.2* 9.8* 9.7* 9.6*  HCT 31.8* 32.3* 30.8* 30.5* 29.7*  MCV 88.8 89.2 88.0 88.4 88.9  PLT 221 255 240 215 195    Cardiac Enzymes: No results for input(s): CKTOTAL, CKMB, CKMBINDEX, TROPONINI in the last 168 hours.  BNP: BNP (last 3 results) No results for input(s): BNP  in the last 8760 hours.  ProBNP (last 3 results) No results for input(s): PROBNP in the last 8760 hours.    Other results:  Imaging: Dg Chest 2 View  Result Date: 07/24/2016 CLINICAL DATA:  CABG x2, hypertension, coronary artery disease EXAM: CHEST  2 VIEW COMPARISON:  07/18/2016 FINDINGS: Small bilateral pleural effusions. Mild bilateral interstitial thickening. No pneumothorax. Stable cardiomegaly. Changes from CABG. Mitral valve replacement. No osseous abnormality. IMPRESSION: 1. Findings consistent with mild CHF. Electronically Signed   By: Kathreen Devoid   On: 07/24/2016 08:19     Medications:     Scheduled Medications: . amiodarone  200 mg Oral BID  . bisacodyl  10 mg Oral Daily   Or  . bisacodyl  10 mg Rectal Daily  . digoxin  0.125 mg Oral Daily  . feeding supplement (ENSURE ENLIVE)  237 mL Oral BID  BM  . furosemide  40 mg Oral BID  . insulin aspart  0-15 Units Subcutaneous TID WC  . losartan  25 mg Oral BID  . rosuvastatin  10 mg Oral QHS  . spironolactone  25 mg Oral Daily  . warfarin  10 mg Oral q1800  . Warfarin - Physician Dosing Inpatient   Does not apply q1800    Infusions: . sodium chloride Stopped (07/19/16 1344)  . heparin 2,050 Units/hr (07/24/16 0800)  . milrinone 0.25 mcg/kg/min (07/24/16 0800)    PRN Medications: fentaNYL (SUBLIMAZE) injection, Influenza vac split quadrivalent PF, metoprolol, ondansetron (ZOFRAN) IV, sodium chloride flush, sodium chloride flush   Assessment/Plan/Discussion    1. CAD: s/p redo CABG with MVR. Continue statin.   2. Acute on chronic systolic CHF: Ischemic cardiomyopathy.  Pre-op EF 35-40%.  TEE 10/18 with EF 20-25%, moderate RV dysfunction.  Low co-ox suggestive of low output post-op, weaned off milrinone but co-ox down to 51% and felt nauseated, so milrinone restarted. Now at 0.25.  Co-ox 63% today. He is AV pacing with underlying CHB.  CVP 6-8, volume looks better, creatinine up a bit.  - Continue milrinone 0.25 and digoxin, check digoxin level tomorrow am.  - transition to po Lasix today.  - Continue losartan 25 bid and spironolactone.  Suspect we can eventually transition to [facility].  - No beta blocker for now with low output.  - Plan for CRT-D when INR > 2 and we can stop heparin (hopefully tomorrow, Dr. Curt Bears has seen).  - Hopefully with CRT we can wean him off milrinone.  3. Bradycardia: Underlying CHB.  Currently AV-pacing.  Plan for CRT-D likely tomorrow.   4. Atrial flutter: Post-op.  DCCV on 10/18,  A-paced today.  - Continue heparin gtt with warfarin until INR > 2.  - Continue  amiodarone 200 mg bid now that we have finalized plan for pacing.  5. S/p bioprosthetic MVR: Stable on TEE 10/18.   Length of Stay: [place] 07/24/2016, 9:04 AM  Advanced Heart Failure Team Pager [phone] (M-F; 7a - 4p)  Please  contact [facility] for night-coverage after hours (4p -7a ) and weekends on [email or web address]

## 2016-07-24 NOTE — Progress Notes (Addendum)
Christian Bowman for Heparin> warfarin Indication: tissue MVR / aflutter  Allergies  Allergen Reactions  . Xanax [Alprazolam] Other (See Comments)    Pt feels very weak, tired and feels paralyzed      Patient Measurements: Height: 6' (182.9 cm) Weight: 192 lb 11.2 oz (87.4 kg) IBW/kg (Calculated) : 77.6   Vital Signs: Temp: 97.5 F (36.4 C) (10/23 0700) Temp Source: Oral (10/23 0700) BP: 93/53 (10/23 1000) Pulse Rate: 80 (10/23 1000)  Labs:  Recent Labs  07/22/16 0425 07/22/16 0538 07/23/16 0449 07/23/16 0450 07/24/16 0223  HGB 9.8*  --  9.7*  --  9.6*  HCT 30.8*  --  30.5*  --  29.7*  PLT 240  --  215  --  195  LABPROT  --   --   --  19.0* 21.3*  INR  --   --   --  1.58 1.82  HEPARINUNFRC  --  0.44  --  0.59 0.39  CREATININE 1.19  --  1.28*  --  1.52*    Estimated Creatinine Clearance: 51.1 mL/min (by C-G formula based on SCr of 1.52 mg/dL (H)).    Assessment: 70yom continues on heparin for tissue MVR and aflutter. Coumadin resumed 10/17 per MD. Now s/p successful TEE guided cardioversion on 10/19. He is in complete heart block w/ temp pacemaker and will need CRT-D soon. Heparin level, 0.39, continues to be therapeutic on 2050 units/hr.  CBC stable, no bleeding noted INR trending upward to 1.82, however still subtherapeutic  Goal of Therapy:  Heparin level 0.3-0.5 units/ml Monitor platelets by anticoagulation protocol: Yes   Plan:  -Continue heparin at 2050 units/hr (stop when INR >2 per MD) -Daily HL, CBC -Next level with AM labs Warfarin 10 mg x1 per MD   Carlean Jews, Pharm.D. PGY1 Pharmacy Resident 10/23/201710:51 AM Pager [phone]

## 2016-07-24 NOTE — Progress Notes (Signed)
Nutrition Follow Up  DOCUMENTATION CODES:   Severe malnutrition in context of chronic illness  INTERVENTION:    Continue Ensure Enlive po BID, each supplement provides 350 kcal and 20 grams of protein  NUTRITION DIAGNOSIS:   Increased nutrient needs related to  (post-op healing) as evidenced by estimated needs, ongoing  GOAL:   Patient will meet greater than or equal to 90% of their needs, progressing  MONITOR:   PO intake, Supplement acceptance, Labs, Weight trends, Skin, I & O's  ASSESSMENT:   70 yo Male with severe ischemic cardiomyopathy, chronic combined systolic and diastolic congestive heart failure, severe mitral regurgitation, hypertension, type 2 diabetes mellitus, and hyperlipidemia who is s/p redo coronary artery bypass grafting and mitral valve replacement.  Patient s/p procedures 10/10: REDO CORONARY ARTERY BYPASS GRAFTING x 2 MITRAL VALVE REPLACEMENT   Pt s/p TEE 10/18. Per pt and pt's wife, PO intake improving >> 75-80% meal completion.  He does enjoy drinking his Ensure Enlive supplements.  Per wife, pt has lost a severe amount of weight PTA due to limitations of a 1500 salt restriction. We discussed talking with his Cardiologist (after discharge) about liberalizing his salt restriction to optimize nutritional intake.  Nutrition-Focused physical exam completed. Findings are severe fat depletion, severe muscle depletion, and mild to moderate edema.   Diet Order:  Diet heart healthy/carb modified Room service appropriate? Yes; Fluid consistency: Thin  Skin:  Reviewed, no issues  Last BM:  10/20  Height:   Ht Readings from Last 1 Encounters:  07/14/16 6' (1.829 m)    Weight:   Wt Readings from Last 1 Encounters:  07/24/16 192 lb 11.2 oz (87.4 kg)    Wt Readings from Last 20 Encounters:  07/24/16 192 lb 11.2 oz (87.4 kg)  07/10/16 180 lb (81.6 kg)  07/07/16 178 lb 1 oz (80.8 kg)  06/22/16 178 lb (80.7 kg)  06/21/16 180 lb (81.6 kg)  06/15/16  180 lb 12.8 oz (82 kg)  06/08/16 180 lb 12.8 oz (82 kg)  05/11/16 187 lb (84.8 kg)  04/28/16 180 lb 6.4 oz (81.8 kg)  03/08/16 190 lb 9.6 oz (86.5 kg)  12/20/15 193 lb 11.2 oz (87.9 kg)  11/11/15 196 lb (88.9 kg)  10/20/15 193 lb (87.5 kg)  09/08/15 202 lb 6 oz (91.8 kg)  08/12/15 203 lb 3.2 oz (92.2 kg)  07/28/15 204 lb 3.2 oz (92.6 kg)  07/21/15 197 lb (89.4 kg)  08/31/14 224 lb 3.2 oz (101.7 kg)  08/26/13 223 lb 9.6 oz (101.4 kg)    Ideal Body Weight:  81 kg  BMI:  Body mass index is 26.13 kg/m.  Estimated Nutritional Needs:   Kcal:  2200-2400  Protein:  120-130 gm  Fluid:  2.2-2.4 L  EDUCATION NEEDS:   No education needs identified at this time  Arthur Holms, RD, LDN Pager #: [phone] After-Hours Pager #: [phone]

## 2016-07-24 NOTE — Progress Notes (Signed)
Inpatient Diabetes Program Recommendations  AACE/ADA: New Consensus Statement on Inpatient Glycemic Control (2015)  Target Ranges:  Prepandial:   less than 140 mg/dL      Peak postprandial:   less than 180 mg/dL (1-2 hours)      Critically ill patients:  140 - 180 mg/dL   Lab Results  Component Value Date   GLUCAP 206 (H) 07/24/2016   HGBA1C 6.0 (H) 07/07/2016    Review of Glycemic Control:  Results for Christian Bowman, Christian Bowman (MRN [id number]) as of 07/24/2016 14:15  Ref. Range 07/23/2016 12:14 07/23/2016 16:32 07/23/2016 21:15 07/24/2016 08:05 07/24/2016 11:22  Glucose-Capillary Latest Ref Range: 65 - 99 mg/dL 167 (H) 184 (H) 178 (H) 137 (H) 206 (H)   Diabetes history: Type 2 diabetes Outpatient Diabetes medications: Metformin 500 mg bid Current orders for Inpatient glycemic control:  Novolog moderate tid with meals   Inpatient Diabetes Program Recommendations:    May consider adding Novolog meal coverage 3 units tid with meals-hold if patient eats less than 50%.   Thanks, Adah Perl, RN, BC-ADM Inpatient Diabetes Coordinator Pager [phone] (8a-5p)

## 2016-07-25 ENCOUNTER — Encounter (HOSPITAL_COMMUNITY): Payer: Self-pay | Admitting: Cardiology

## 2016-07-25 ENCOUNTER — Encounter (HOSPITAL_COMMUNITY)
Admission: RE | Disposition: A | Discharge status: Home / Self Care | Payer: Self-pay | Source: Ambulatory Visit | Attending: Thoracic Surgery (Cardiothoracic Vascular Surgery)

## 2016-07-25 DIAGNOSIS — I255 Ischemic cardiomyopathy: Secondary | ICD-10-CM

## 2016-07-25 DIAGNOSIS — I5022 Chronic systolic (congestive) heart failure: Secondary | ICD-10-CM

## 2016-07-25 HISTORY — PX: EP IMPLANTABLE DEVICE: SHX172B

## 2016-07-25 LAB — COOXEMETRY PANEL
CARBOXYHEMOGLOBIN: 1.2 % (ref 0.5–1.5)
Carboxyhemoglobin: 1.6 % — ABNORMAL HIGH (ref 0.5–1.5)
Methemoglobin: 0.8 % (ref 0.0–1.5)
Methemoglobin: 0.9 % (ref 0.0–1.5)
O2 Saturation: 65.3 %
O2 Saturation: 83.3 %
Total hemoglobin: 13 g/dL (ref 12.0–16.0)
Total hemoglobin: 9.3 g/dL — ABNORMAL LOW (ref 12.0–16.0)

## 2016-07-25 LAB — CBC
HCT: 28.8 % — ABNORMAL LOW (ref 39.0–52.0)
HEMOGLOBIN: 9.2 g/dL — AB (ref 13.0–17.0)
MCH: 28.6 pg (ref 26.0–34.0)
MCHC: 31.9 g/dL (ref 30.0–36.0)
MCV: 89.4 fL (ref 78.0–100.0)
Platelets: 192 10*3/uL (ref 150–400)
RBC: 3.22 MIL/uL — AB (ref 4.22–5.81)
RDW: 15.4 % (ref 11.5–15.5)
WBC: 10.9 10*3/uL — AB (ref 4.0–10.5)

## 2016-07-25 LAB — BASIC METABOLIC PANEL
ANION GAP: 8 (ref 5–15)
BUN: 20 mg/dL (ref 6–20)
CALCIUM: 8.2 mg/dL — AB (ref 8.9–10.3)
CO2: 30 mmol/L (ref 22–32)
CREATININE: 1.38 mg/dL — AB (ref 0.61–1.24)
Chloride: 95 mmol/L — ABNORMAL LOW (ref 101–111)
GFR calc Af Amer: 59 mL/min — ABNORMAL LOW (ref >60)
GFR calc non Af Amer: 51 mL/min — ABNORMAL LOW (ref >60)
GLUCOSE: 153 mg/dL — AB (ref 65–99)
Potassium: 4 mmol/L (ref 3.5–5.1)
Sodium: 133 mmol/L — ABNORMAL LOW (ref 135–145)

## 2016-07-25 LAB — GLUCOSE, CAPILLARY
GLUCOSE-CAPILLARY: 202 mg/dL — AB (ref 65–99)
GLUCOSE-CAPILLARY: 134 mg/dL — AB (ref 65–99)
Glucose-Capillary: 121 mg/dL — ABNORMAL HIGH (ref 65–99)
Glucose-Capillary: 168 mg/dL — ABNORMAL HIGH (ref 65–99)
Glucose-Capillary: 190 mg/dL — ABNORMAL HIGH (ref 65–99)

## 2016-07-25 LAB — DIGOXIN LEVEL: Digoxin Level: <0.2 ng/mL — ABNORMAL LOW (ref 0.8–2.0)

## 2016-07-25 LAB — PROTIME-INR
INR: 2.31
Prothrombin Time: 25.8 seconds — ABNORMAL HIGH (ref 11.4–15.2)

## 2016-07-25 LAB — HEPARIN LEVEL (UNFRACTIONATED): Heparin Unfractionated: >2.2 IU/mL — ABNORMAL HIGH (ref 0.30–0.70)

## 2016-07-25 SURGERY — BIV ICD INSERTION CRT-D
Anesthesia: LOCAL

## 2016-07-25 MED ORDER — CHLORHEXIDINE GLUCONATE 4 % EX LIQD
60.0000 mL | Freq: Once | CUTANEOUS | Status: AC
  Administered 2016-07-25: 4 via TOPICAL
  Filled 2016-07-25: qty 60

## 2016-07-25 MED ORDER — SODIUM CHLORIDE 0.9 % IR SOLN
80.0000 mg | Status: AC
  Administered 2016-07-25: 80 mg
  Filled 2016-07-25: qty 2

## 2016-07-25 MED ORDER — SACUBITRIL-VALSARTAN 24-26 MG PO TABS
1.0000 | ORAL_TABLET | Freq: Two times a day (BID) | ORAL | Status: DC
  Administered 2016-07-26 – 2016-08-01 (×13): 1 via ORAL
  Filled 2016-07-25 (×13): qty 1

## 2016-07-25 MED ORDER — ONDANSETRON HCL 4 MG/2ML IJ SOLN
4.0000 mg | Freq: Four times a day (QID) | INTRAMUSCULAR | Status: DC | PRN
  Administered 2016-07-26 – 2016-07-31 (×4): 4 mg via INTRAVENOUS
  Filled 2016-07-25 (×3): qty 2

## 2016-07-25 MED ORDER — CEFAZOLIN IN D5W 1 GM/50ML IV SOLN
1.0000 g | Freq: Four times a day (QID) | INTRAVENOUS | Status: AC
  Administered 2016-07-25 – 2016-07-26 (×3): 1 g via INTRAVENOUS
  Filled 2016-07-25 (×4): qty 50

## 2016-07-25 MED ORDER — IOPAMIDOL (ISOVUE-370) INJECTION 76%
INTRAVENOUS | Status: AC
  Filled 2016-07-25: qty 50

## 2016-07-25 MED ORDER — HEPARIN (PORCINE) IN NACL 2-0.9 UNIT/ML-% IJ SOLN
INTRAMUSCULAR | Status: DC | PRN
  Administered 2016-07-25: 500 mL

## 2016-07-25 MED ORDER — ACETAMINOPHEN 325 MG PO TABS: 325.0000 mg | ORAL_TABLET | ORAL | Status: DC | PRN

## 2016-07-25 MED ORDER — CEFAZOLIN SODIUM-DEXTROSE 2-4 GM/100ML-% IV SOLN
2.0000 g | INTRAVENOUS | Status: DC
  Filled 2016-07-25: qty 100

## 2016-07-25 MED ORDER — SODIUM CHLORIDE 0.9 % IV SOLN: INTRAVENOUS | Status: DC

## 2016-07-25 MED ORDER — HEPARIN (PORCINE) IN NACL 2-0.9 UNIT/ML-% IJ SOLN
INTRAMUSCULAR | Status: AC
  Filled 2016-07-25: qty 500

## 2016-07-25 MED ORDER — WARFARIN SODIUM 7.5 MG PO TABS: 7.5000 mg | ORAL_TABLET | Freq: Every day | ORAL | Status: DC

## 2016-07-25 MED ORDER — LIDOCAINE HCL (PF) 1 % IJ SOLN
INTRAMUSCULAR | Status: AC
  Filled 2016-07-25: qty 60

## 2016-07-25 MED ORDER — MIDAZOLAM HCL 5 MG/5ML IJ SOLN
INTRAMUSCULAR | Status: AC
  Filled 2016-07-25: qty 5

## 2016-07-25 MED ORDER — LOSARTAN POTASSIUM 25 MG PO TABS
25.0000 mg | ORAL_TABLET | Freq: Once | ORAL | Status: AC
  Administered 2016-07-25: 25 mg via ORAL
  Filled 2016-07-25: qty 1

## 2016-07-25 MED ORDER — MIDAZOLAM HCL 5 MG/5ML IJ SOLN
INTRAMUSCULAR | Status: DC | PRN
  Administered 2016-07-25 (×5): 1 mg via INTRAVENOUS
  Administered 2016-07-25: 2 mg via INTRAVENOUS

## 2016-07-25 MED ORDER — CEFAZOLIN SODIUM-DEXTROSE 2-4 GM/100ML-% IV SOLN
INTRAVENOUS | Status: AC
  Filled 2016-07-25: qty 100

## 2016-07-25 MED ORDER — WARFARIN SODIUM 5 MG PO TABS
5.0000 mg | ORAL_TABLET | Freq: Every day | ORAL | Status: DC
  Administered 2016-07-25 – 2016-07-26 (×2): 5 mg via ORAL
  Filled 2016-07-25 (×2): qty 1

## 2016-07-25 MED ORDER — CEFAZOLIN SODIUM-DEXTROSE 2-3 GM-% IV SOLR
INTRAVENOUS | Status: DC | PRN
  Administered 2016-07-25: 2 g via INTRAVENOUS

## 2016-07-25 MED ORDER — FENTANYL CITRATE (PF) 100 MCG/2ML IJ SOLN
INTRAMUSCULAR | Status: AC
  Filled 2016-07-25: qty 2

## 2016-07-25 MED ORDER — FENTANYL CITRATE (PF) 100 MCG/2ML IJ SOLN
INTRAMUSCULAR | Status: DC | PRN
  Administered 2016-07-25 (×3): 25 ug via INTRAVENOUS

## 2016-07-25 MED ORDER — SODIUM CHLORIDE 0.9 % IR SOLN
Status: AC
  Filled 2016-07-25: qty 2

## 2016-07-25 SURGICAL SUPPLY — 17 items
CABLE SURGICAL S-101-97-12 (CABLE) ×1 IMPLANT
CATH ATTAIN COM SURV 6250V-EH (CATHETERS) ×1 IMPLANT
CATH ATTAIN COM SURV 6250V-MB2 (CATHETERS) ×1 IMPLANT
CATH HEX JOS 2-5-2 65CM 6F REP (CATHETERS) ×1 IMPLANT
ICD CLARIA MRI DTMA1QQ (ICD Generator) ×2 IMPLANT
KIT ESSENTIALS PG (KITS) ×1 IMPLANT
LEAD ATTAIN PERFORMA S 4598-88 (Lead) ×2 IMPLANT
LEAD CAPSURE NOVUS 5076-52CM (Lead) ×2 IMPLANT
LEAD SPRINT QUAT SEC 6935M-62 (Lead) ×1 IMPLANT
PAD DEFIB LIFELINK (PAD) ×2 IMPLANT
SHEATH CLASSIC 7F (SHEATH) ×1 IMPLANT
SHEATH CLASSIC 9.5F (SHEATH) ×2 IMPLANT
SHEATH CLASSIC 9F (SHEATH) ×2 IMPLANT
SLITTER 6230UNI (MISCELLANEOUS) ×1 IMPLANT
TRAY PACEMAKER INSERTION (PACKS) ×1 IMPLANT
WIRE ACUITY WHISPER EDS 4648 (WIRE) ×2 IMPLANT
WIRE HI TORQ VERSACORE-J 145CM (WIRE) ×2 IMPLANT

## 2016-07-25 NOTE — Progress Notes (Signed)
Patient Name: Christian Bowman Date of Encounter: 07/25/2016  Primary Cardiologist: [facility] Problem List     Principal Problem:   S/P mitral valve replacement with bioprosthetic valve + redo CABG x2 Active Problems:   HTN (hypertension)   Hyperlipidemia   Type 2 diabetes mellitus with vascular disease (HCC)   Chronic combined systolic and diastolic heart failure (HCC)   Mitral regurgitation   Coronary artery disease involving coronary bypass graft   S/P CABG x 4   Left main coronary artery disease   Coronary artery disease involving native coronary artery   S/P redo CABG x 2   Acute on chronic systolic CHF (congestive heart failure) (HCC)   Typical atrial flutter (HCC)   Protein-calorie malnutrition, severe     Subjective   Continued AV block.  Feeling improved over the last few days as has been eating better and getting sleep.  Inpatient Medications    Scheduled Meds: . amiodarone  200 mg Oral BID  . bisacodyl  10 mg Oral Daily   Or  . bisacodyl  10 mg Rectal Daily  .  ceFAZolin (ANCEF) IV  2 g Intravenous On Call  . chlorhexidine  60 mL Topical Once  . digoxin  0.125 mg Oral Daily  . feeding supplement (ENSURE ENLIVE)  237 mL Oral BID BM  . furosemide  40 mg Oral BID  . gentamicin irrigation  80 mg Irrigation On Call  . insulin aspart  0-15 Units Subcutaneous TID WC  . losartan  25 mg Oral BID  . rosuvastatin  10 mg Oral QHS  . spironolactone  25 mg Oral Daily  . warfarin  5 mg Oral q1800  . Warfarin - Physician Dosing Inpatient   Does not apply q1800   Continuous Infusions: . sodium chloride Stopped (07/19/16 1344)  . sodium chloride    . sodium chloride    . milrinone 0.25 mcg/kg/min (07/25/16 0400)   PRN Meds: fentaNYL (SUBLIMAZE) injection, Influenza vac split quadrivalent PF, metoprolol, ondansetron (ZOFRAN) IV, sodium chloride flush, sodium chloride flush   Vital Signs    Vitals:   07/25/16 0300 07/25/16 0400 07/25/16 0500 07/25/16 0600    BP:  126/77  130/73  Pulse: 80 80 81 80  Resp: (!) 23 (!) 23 20 (!) 22  Temp:      TempSrc:      SpO2: 97% 97% 97% 97%  Weight:   192 lb 3.9 oz (87.2 kg)   Height:        Intake/Output Summary (Last 24 hours) at 07/25/16 0721 Last data filed at 07/25/16 0600  Gross per 24 hour  Intake          1131.34 ml  Output             1325 ml  Net          -193.66 ml   Filed Weights   07/23/16 0600 07/24/16 0600 07/25/16 0500  Weight: 192 lb 12.8 oz (87.5 kg) 192 lb 11.2 oz (87.4 kg) 192 lb 3.9 oz (87.2 kg)    Physical Exam    GEN: Well nourished, well developed, in no acute distress.  HEENT: Grossly normal.  Neck: Supple, no JVD, carotid bruits, or masses. Cardiac: RRR, no murmurs, rubs, or gallops. No clubbing, cyanosis, edema.  Radials/DP/PT 2+ and equal bilaterally.  Respiratory:  Respirations regular and unlabored, clear to auscultation bilaterally. GI: Soft, nontender, nondistended, BS + x 4. MS: no deformity or atrophy. Skin: warm and  dry, no rash. Sternal wound well healed. Neuro:  Strength and sensation are intact. Psych: AAOx3.  Normal affect.  Labs    CBC  Recent Labs  07/24/16 0223 07/25/16 0433  WBC 14.4* 10.9*  HGB 9.6* 9.2*  HCT 29.7* 28.8*  MCV 88.9 89.4  PLT 195 [id number]   Basic Metabolic Panel  Recent Labs  07/24/16 0223 07/25/16 0433  NA 135 133*  K 4.1 4.0  CL 96* 95*  CO2 30 30  GLUCOSE 197* 153*  BUN 18 20  CREATININE 1.52* 1.38*  CALCIUM 8.4* 8.2*   Liver Function Tests  Recent Labs  07/23/16 0449  AST 30  ALT 37  ALKPHOS 57  BILITOT 1.0  PROT 5.2*  ALBUMIN 2.3*   No results for input(s): LIPASE, AMYLASE in the last 72 hours. Cardiac Enzymes No results for input(s): CKTOTAL, CKMB, CKMBINDEX, TROPONINI in the last 72 hours. BNP Invalid input(s): POCBNP D-Dimer No results for input(s): DDIMER in the last 72 hours. Hemoglobin A1C No results for input(s): HGBA1C in the last 72 hours. Fasting Lipid Panel No results for  input(s): CHOL, HDL, LDLCALC, TRIG, CHOLHDL, LDLDIRECT in the last 72 hours. Thyroid Function Tests No results for input(s): TSH, T4TOTAL, T3FREE, THYROIDAB in the last 72 hours.  Invalid input(s): FREET3  Telemetry    V paced - Personally Reviewed  ECG    V paced - Personally Reviewed  Radiology    Dg Chest 2 View  Result Date: 07/24/2016 CLINICAL DATA:  CABG x2, hypertension, coronary artery disease EXAM: CHEST  2 VIEW COMPARISON:  07/18/2016 FINDINGS: Small bilateral pleural effusions. Mild bilateral interstitial thickening. No pneumothorax. Stable cardiomegaly. Changes from CABG. Mitral valve replacement. No osseous abnormality. IMPRESSION: 1. Findings consistent with mild CHF. Electronically Signed   By: Kathreen Devoid   On: 07/24/2016 08:19    Cardiac Studies   - Severe global reduction in LV functon; severe LVE; s/p MVR with   trace MR; moderate LAE; no LAA thrombus; moderate RV   diysfunction; mild to moderate TR.   Assessment & Bowman    1. Complete AV block: None being complete heart block after redo CABG and MVR. Has not had any evidence of recovery of his conduction system. Therefore Christian Bowman require pacing. Christian Bowman for CRT-D. Risks and benefits of the procedure were discussed. Risks include bleeding, tamponade, infection, pneumothorax. The patient understands these risks and has agreed to the procedure. INR therapeutic and has stopped heparin.  2. Ischemic cardiomyopathy: Ejection fraction 25-30%. Continue current therapy  3. Atrial flutter: Sinus rhythm.  Continue coumadin.  Signed, Christian Edgar Meredith Leeds, MD  07/25/2016, 7:21 AM

## 2016-07-25 NOTE — Progress Notes (Signed)
Patient ID: Christian Bowman, male   DOB: Feb 16, 1946, 70 y.o.   MRN: [id number]    Advanced Heart Failure Rounding Note   Subjective:    S/P CABG + bioprosthetic MVR on 10/10  Milrinone stopped 10/18 prior to DCCV (atrial flutter).  TEE-guided DCCV 10/18.  Underlying rhythm remained CHB, temporary wire changed to DDD mode by Dr Roxy Manns.    S/p CRT-D placement am of 07/25/16.  Co-ox low on 10/20, milrinone restarted and increased to 0.25.  Co-ox 65.3% today.   CVP 6-7. Creatinine 1.38   Feeling "better than yesterday". Says he still feels a little anxious even though procedure is done.   INR 2.31  BiV pacing now on tele. Rates high in 100-110s  TEE (10/18): EF 20-25%, MV bioprosthesis looks ok, moderately decreased RV systolic function.   Objective:   Weight Range:  Vital Signs:   Temp:  [97.7 F (36.5 C)] 97.7 F (36.5 C) (10/23 1500) Pulse Rate:  [0-224] 107 (10/24 1019) Resp:  [0-54] 0 (10/24 1009) BP: (82-152)/(46-85) 152/81 (10/24 1019) SpO2:  [0 %-100 %] 0 % (10/24 1009) Weight:  [192 lb 3.9 oz (87.2 kg)] 192 lb 3.9 oz (87.2 kg) (10/24 0500) Last BM Date: 07/21/16  Weight change: Filed Weights   07/23/16 0600 07/24/16 0600 07/25/16 0500  Weight: 192 lb 12.8 oz (87.5 kg) 192 lb 11.2 oz (87.4 kg) 192 lb 3.9 oz (87.2 kg)    Intake/Output:   Intake/Output Summary (Last 24 hours) at 07/25/16 1107 Last data filed at 07/25/16 0600  Gross per 24 hour  Intake           622.94 ml  Output             1325 ml  Net          -702.06 ml    Physical Exam: CVP 6-7 General: NAD, sitting up in bed.  HEENT: Normal, except R neck cyst.  Neck: supple. JVP flat. Carotids 2+ bilat; no bruits. No thyromegaly or nodule noted.  Cor: PMI nondisplaced. Regular. Tachy. No M/G/R.  Lungs: CTAB, normal effort Abdomen: soft, NT, mild distention, no HSM. No bruits or masses. +BS  Extremities: no cyanosis, clubbing, rash.  1+ edema to knees bilaterally Neuro: alert & orientedx3, cranial nerves  grossly intact. moves all 4 extremities w/o difficulty. Affect pleasant  Telemetry:  BiV pacing, 110s  Labs: Basic Metabolic Panel:  Recent Labs Lab 07/21/16 0415 07/22/16 0425 07/23/16 0449 07/24/16 0223 07/25/16 0433  NA 136 135 135 135 133*  K 4.1 4.0 4.4 4.1 4.0  CL 99* 99* 98* 96* 95*  CO2 30 30 30 30 30   GLUCOSE 148* 137* 153* 197* 153*  BUN 19 15 14 18 20   CREATININE 1.22 1.19 1.28* 1.52* 1.38*  CALCIUM 8.2* 8.2* 8.3* 8.4* 8.2*    Liver Function Tests:  Recent Labs Lab 07/23/16 0449  AST 30  ALT 37  ALKPHOS 57  BILITOT 1.0  PROT 5.2*  ALBUMIN 2.3*   No results for input(s): LIPASE, AMYLASE in the last 168 hours. No results for input(s): AMMONIA in the last 168 hours.  CBC:  Recent Labs Lab 07/21/16 0415 07/22/16 0425 07/23/16 0449 07/24/16 0223 07/25/16 0433  WBC 14.2* 14.9* 14.4* 14.4* 10.9*  HGB 10.2* 9.8* 9.7* 9.6* 9.2*  HCT 32.3* 30.8* 30.5* 29.7* 28.8*  MCV 89.2 88.0 88.4 88.9 89.4  PLT 255 240 215 195 192    Cardiac Enzymes: No results for input(s): CKTOTAL, CKMB, CKMBINDEX, TROPONINI in  the last 168 hours.  BNP: BNP (last 3 results) No results for input(s): BNP in the last 8760 hours.  ProBNP (last 3 results) No results for input(s): PROBNP in the last 8760 hours.    Other results:  Imaging: Dg Chest 2 View  Result Date: 07/24/2016 CLINICAL DATA:  CABG x2, hypertension, coronary artery disease EXAM: CHEST  2 VIEW COMPARISON:  07/18/2016 FINDINGS: Small bilateral pleural effusions. Mild bilateral interstitial thickening. No pneumothorax. Stable cardiomegaly. Changes from CABG. Mitral valve replacement. No osseous abnormality. IMPRESSION: 1. Findings consistent with mild CHF. Electronically Signed   By: Kathreen Devoid   On: 07/24/2016 08:19     Medications:     Scheduled Medications: . amiodarone  200 mg Oral BID  . bisacodyl  10 mg Oral Daily   Or  . bisacodyl  10 mg Rectal Daily  .  ceFAZolin (ANCEF) IV  1 g  Intravenous Q6H  . digoxin  0.125 mg Oral Daily  . feeding supplement (ENSURE ENLIVE)  237 mL Oral BID BM  . furosemide  40 mg Oral BID  . insulin aspart  0-15 Units Subcutaneous TID WC  . losartan  25 mg Oral BID  . rosuvastatin  10 mg Oral QHS  . spironolactone  25 mg Oral Daily  . warfarin  5 mg Oral q1800  . Warfarin - Physician Dosing Inpatient   Does not apply q1800    Infusions: . sodium chloride Stopped (07/19/16 1344)  . milrinone 0.25 mcg/kg/min (07/25/16 0400)    PRN Medications: acetaminophen, fentaNYL (SUBLIMAZE) injection, Influenza vac split quadrivalent PF, metoprolol, ondansetron (ZOFRAN) IV, ondansetron (ZOFRAN) IV, sodium chloride flush, sodium chloride flush   Assessment/Plan/Discussion    1. CAD: s/p redo CABG with MVR. Continue statin.   2. Acute on chronic systolic CHF: Ischemic cardiomyopathy.  Pre-op EF 35-40%.  TEE 10/18 with EF 20-25%, moderate RV dysfunction.  Low co-ox suggestive of low output post-op, weaned off milrinone but co-ox down to 51% and felt nauseated, so milrinone restarted. Now at 0.25.  Co-ox 63% today. He is AV pacing with underlying CHB.  CVP 6-7 Volume status stable. Creatinine somewhat improved with transition to po   - Decrease milrinone 0.125 mcg/kg/min now that he is CRT-D. Coox this afternoon.   - Continue dixogin 0.125 mg daily. Level 0.2 today.   - Continue po lasix 40 mg BID.   - Continue losartan 25 bid and spironolactone.  Suspect we can eventually transition to [facility].  - No beta blocker for now with low output.  - Now s/p Medtronic CRT-D.   Will decrease milrinone as above.  3. Bradycardia: Underlying CHB.  Currently AV-pacing.  - Now s/p Medtronic CRT-D   4. Atrial flutter: Post-op.  DCCV on 10/18,  A-paced today.  - Resume coumadin this evening.   - Continue amiodarone 200 mg bid for now 5. S/p bioprosthetic MVR: Stable on TEE 10/18.   Length of Stay: [place], [place] 07/25/2016, 11:07  AM  Advanced Heart Failure Team Pager [phone] (M-F; 7a - 4p)  Please contact [facility] for night-coverage after hours (4p -7a ) and weekends on [email or web address]  Patient seen with PA, agree with the above note.  He had Medtronic CRT-D system placed today. Currently sinus with BiV pacing.  CVP 6, co-ox 63%.  - Continue po Lasix.  - Decrease milrinone to 0.125 today and repeat co-ox.  - Resume coumadin this evening.  - Stop losartan after today and start Entresto 24/26 bid.  Loralie Champagne 07/25/2016 1:45 PM

## 2016-07-25 NOTE — H&P (Signed)
ICD Criteria  Current LVEF:25-30%. Within 12 months prior to implant: Yes   Heart failure history: Yes, Class II  Cardiomyopathy history: Yes, Ischemic Cardiomyopathy - Prior MI.  Atrial Fibrillation/Atrial Flutter: Yes, Persistent (> 7 Days).  Ventricular tachycardia history: No.  Cardiac arrest history: No.  History of syndromes with risk of sudden death: No.  Previous ICD: No.  Current ICD indication: Primary  PPM indication: Yes. Pacing type: Ventricular. Greater than 40% RV pacing requirement anticipated. Indication: Complete Heart Block   Class I or II Bradycardia indication present: No  Beta Blocker therapy for 3 or more months: Yes, prescribed.   Ace Inhibitor/ARB therapy for 3 or more months: Yes, prescribed.

## 2016-07-25 NOTE — Progress Notes (Signed)
SLP Cancellation Note  Patient Details Name: Christian Bowman MRN: [id number] DOB: 02-14-1946   Cancelled treatment:       Reason Eval/Treat Not Completed: Patient at procedure or test/unavailable   [name] 07/25/2016, 10:23 AM  [name], M.A. CCC-SLP [phone]

## 2016-07-25 NOTE — Care Management Note (Signed)
Case Management Note  Patient Details  Name: Christian Bowman MRN: [id number] Date of Birth: 1946/02/24  Subjective/Objective:    S/p CABG and MVR                Action/Plan:  PTA independent from home with wife - wife at bedside.  Wife states she will be with pt 24/7 post discharge.  CM will continue to follow for discharge needs   Expected Discharge Date:                  Expected Discharge Plan:  Home/Self Care  In-House Referral:     Discharge planning Services  CM Consult  Post Acute Care Choice:    Choice offered to:     DME Arranged:    DME Agency:     HH Arranged:    HH Agency:     Status of Service:  In process, will continue to follow  If discussed at Long Length of Stay Meetings, dates discussed:    Additional Comments: 07/25/2016 Pt is now 1 day s/p ICD/Biv pacer placement for complete HB post CABG and MVR.   Maryclare Labrador, RN 07/25/2016, 11:15 AM

## 2016-07-25 NOTE — Progress Notes (Signed)
CT surgery p.m. Rounds  Patient examined and record reviewed.Hemodynamics stable,labs satisfactory.Patient had stable day.Continue current care. Christian Bowman 07/25/2016

## 2016-07-26 ENCOUNTER — Inpatient Hospital Stay (HOSPITAL_COMMUNITY): Payer: Medicare Other

## 2016-07-26 LAB — GLUCOSE, CAPILLARY
GLUCOSE-CAPILLARY: 143 mg/dL — AB (ref 65–99)
GLUCOSE-CAPILLARY: 230 mg/dL — AB (ref 65–99)
GLUCOSE-CAPILLARY: 160 mg/dL — AB (ref 65–99)
GLUCOSE-CAPILLARY: 171 mg/dL — AB (ref 65–99)

## 2016-07-26 LAB — BASIC METABOLIC PANEL
Anion gap: 9 (ref 5–15)
BUN: 18 mg/dL (ref 6–20)
CALCIUM: 8.6 mg/dL — AB (ref 8.9–10.3)
CO2: 29 mmol/L (ref 22–32)
CREATININE: 1.29 mg/dL — AB (ref 0.61–1.24)
Chloride: 97 mmol/L — ABNORMAL LOW (ref 101–111)
GFR calc non Af Amer: 55 mL/min — ABNORMAL LOW (ref >60)
Glucose, Bld: 148 mg/dL — ABNORMAL HIGH (ref 65–99)
Potassium: 4.1 mmol/L (ref 3.5–5.1)
SODIUM: 135 mmol/L (ref 135–145)

## 2016-07-26 LAB — COOXEMETRY PANEL
CARBOXYHEMOGLOBIN: 1.3 % (ref 0.5–1.5)
Carboxyhemoglobin: 1.5 % (ref 0.5–1.5)
METHEMOGLOBIN: 0.7 % (ref 0.0–1.5)
Methemoglobin: 1 % (ref 0.0–1.5)
O2 SAT: 67.1 %
O2 SAT: 54.1 %
Total hemoglobin: 11.9 g/dL — ABNORMAL LOW (ref 12.0–16.0)
Total hemoglobin: 10.3 g/dL — ABNORMAL LOW (ref 12.0–16.0)

## 2016-07-26 LAB — PROTIME-INR
INR: 1.79
Prothrombin Time: 21 seconds — ABNORMAL HIGH (ref 11.4–15.2)

## 2016-07-26 MED FILL — Lidocaine HCl Local Preservative Free (PF) Inj 1%: INTRAMUSCULAR | Qty: 30 | Status: AC

## 2016-07-26 NOTE — Progress Notes (Signed)
Christian Bowman, [place] paged about decreased co-ox level since discontinuation of milrinone. Heart Failure team aware. No new orders received. Will continue to monitor pt closely.

## 2016-07-26 NOTE — Progress Notes (Signed)
Patient ID: Christian Bowman, male   DOB: Mar 05, 1946, 70 y.o.   MRN: [id number]    Advanced Heart Failure Rounding Note   Subjective:    S/P CABG + bioprosthetic MVR on 10/10  Milrinone stopped 10/18 prior to DCCV (atrial flutter).  TEE-guided DCCV 10/18.  Underlying rhythm remained CHB, temporary wire changed to DDD mode by Dr Roxy Manns.    S/p CRT-D placement am of 07/25/16.  Co-ox low on 10/20, milrinone restarted and increased to 0.25.  Co-ox 67.1%.   CVP 4-5. Creatinine 1.29. Out 1.4 L and down 2 lbs on po lasix.   Feels good overall. Wants to go home. Says he is tired from lack of sleep, poor food choices, and being "poked and prodded". Other than that feeling OK.   INR 1.79  BiV pacing rate in 100s.  TEE (10/18): EF 20-25%, MV bioprosthesis looks ok, moderately decreased RV systolic function.   Objective:   Weight Range:  Vital Signs:   Temp:  [98.1 F (36.7 C)-99 F (37.2 C)] 98.5 F (36.9 C) (10/25 0754) Pulse Rate:  [0-116] 104 (10/25 0700) Resp:  [0-29] 24 (10/25 0700) BP: (89-156)/(60-94) 140/84 (10/25 0600) SpO2:  [0 %-99 %] 98 % (10/25 0700) Weight:  [190 lb 14.7 oz (86.6 kg)] 190 lb 14.7 oz (86.6 kg) (10/25 0500) Last BM Date: 07/21/16  Weight change: Filed Weights   07/24/16 0600 07/25/16 0500 07/26/16 0500  Weight: 192 lb 11.2 oz (87.4 kg) 192 lb 3.9 oz (87.2 kg) 190 lb 14.7 oz (86.6 kg)    Intake/Output:   Intake/Output Summary (Last 24 hours) at 07/26/16 0930 Last data filed at 07/26/16 0700  Gross per 24 hour  Intake           256.97 ml  Output             1725 ml  Net         -1468.03 ml    Physical Exam: CVP 6-7 General: NAD, sitting up in bed.  HEENT: Normal, except R neck cyst.  Neck: supple. JVP flat. Carotids 2+ bilat; no bruits. No thyromegaly or nodule noted.  Cor: PMI nondisplaced. Regular. Tachy. No M/G/R.  Lungs: CTAB, normal effort Abdomen: soft, NT, mild distention, no HSM. No bruits or masses. +BS  Extremities: no cyanosis,  clubbing, rash.  1+ edema to knees bilaterally Neuro: alert & orientedx3, cranial nerves grossly intact. moves all 4 extremities w/o difficulty. Affect pleasant  Telemetry: Reviewed, BiV pacing 100s  Labs: Basic Metabolic Panel:  Recent Labs Lab 07/22/16 0425 07/23/16 0449 07/24/16 0223 07/25/16 0433 07/26/16 0600  NA 135 135 135 133* 135  K 4.0 4.4 4.1 4.0 4.1  CL 99* 98* 96* 95* 97*  CO2 30 30 30 30 29   GLUCOSE 137* 153* 197* 153* 148*  BUN 15 14 18 20 18   CREATININE 1.19 1.28* 1.52* 1.38* 1.29*  CALCIUM 8.2* 8.3* 8.4* 8.2* 8.6*    Liver Function Tests:  Recent Labs Lab 07/23/16 0449  AST 30  ALT 37  ALKPHOS 57  BILITOT 1.0  PROT 5.2*  ALBUMIN 2.3*   No results for input(s): LIPASE, AMYLASE in the last 168 hours. No results for input(s): AMMONIA in the last 168 hours.  CBC:  Recent Labs Lab 07/21/16 0415 07/22/16 0425 07/23/16 0449 07/24/16 0223 07/25/16 0433  WBC 14.2* 14.9* 14.4* 14.4* 10.9*  HGB 10.2* 9.8* 9.7* 9.6* 9.2*  HCT 32.3* 30.8* 30.5* 29.7* 28.8*  MCV 89.2 88.0 88.4 88.9 89.4  PLT  255 240 215 195 192    Cardiac Enzymes: No results for input(s): CKTOTAL, CKMB, CKMBINDEX, TROPONINI in the last 168 hours.  BNP: BNP (last 3 results) No results for input(s): BNP in the last 8760 hours.  ProBNP (last 3 results) No results for input(s): PROBNP in the last 8760 hours.    Other results:  Imaging: Dg Chest 2 View  Result Date: 07/26/2016 CLINICAL DATA:  Cardiac device in place. EXAM: CHEST  2 VIEW COMPARISON:  Radiographs of July 24, 2016. FINDINGS: Stable cardiomegaly. Status post coronary artery bypass graft. Interval placement of left-sided pacemaker with leads in grossly good position. Right-sided PICC line is unchanged in position. No pneumothorax is noted. Minimal bilateral pleural effusions are noted. Status post cardiac valve repair. Bony thorax is unremarkable. IMPRESSION: Interval placement of left-sided pacemaker with leads  in grossly good position. Aortic atherosclerosis. Minimal bilateral pleural effusions. Electronically Signed   By: Marijo Conception, M.D.   On: 07/26/2016 08:40     Medications:     Scheduled Medications: . amiodarone  200 mg Oral BID  . bisacodyl  10 mg Oral Daily   Or  . bisacodyl  10 mg Rectal Daily  . digoxin  0.125 mg Oral Daily  . feeding supplement (ENSURE ENLIVE)  237 mL Oral BID BM  . furosemide  40 mg Oral BID  . insulin aspart  0-15 Units Subcutaneous TID WC  . rosuvastatin  10 mg Oral QHS  . sacubitril-valsartan  1 tablet Oral BID  . spironolactone  25 mg Oral Daily  . warfarin  5 mg Oral q1800  . Warfarin - Physician Dosing Inpatient   Does not apply q1800    Infusions: . sodium chloride Stopped (07/19/16 1344)    PRN Medications: acetaminophen, fentaNYL (SUBLIMAZE) injection, Influenza vac split quadrivalent PF, metoprolol, ondansetron (ZOFRAN) IV, ondansetron (ZOFRAN) IV, sodium chloride flush, sodium chloride flush   Assessment/Plan/Discussion    1. CAD: s/p redo CABG with MVR. Continue statin.   2. Acute on chronic systolic CHF: Ischemic cardiomyopathy.  Pre-op EF 35-40%.  TEE 10/18 with EF 20-25%, moderate RV dysfunction.  Low co-ox suggestive of low output post-op, weaned off milrinone but co-ox down to 51% and felt nauseated, so milrinone restarted. Now at 0.125 after decrease yesterday.  Co-ox 63% today.   Now s/p Medtronic CRT-D.  - Stop milrinone off. Recheck Coox this afternoon.    - Continue dixogin 0.125 mg daily. Level 0.2 today.   - Volume status stable to improved. Continue po lasix 40 mg BID.  CVP down slightly. Will follow closely, may need to back down on dose, although Creatinine improved.  - Continue spironolactone, Entresto started today.  - No beta blocker for now with low output.   3. Bradycardia: Underlying CHB.  Currently sinus tachy with BiV pacing.  - Now s/p Medtronic CRT-D   4. Atrial flutter: Post-op.  DCCV on 10/18,  NSR today with  BiV pacing.   - Restarted on coumadin. Dosing per pharm.    - Continue amiodarone 200 mg bid for now 5. S/p bioprosthetic MVR: Stable on TEE 10/18.   Length of Stay: [place], [place] 07/26/2016, 9:30 AM  Advanced Heart Failure Team Pager [phone] (M-F; 7a - 4p)  Please contact [facility] for night-coverage after hours (4p -7a ) and weekends on [email or web address]  Patient seen with PA, agree with the above note.  Stop milrinone today, repeat co-ox.  Doing better with CRT.  Transitioning to [facility].  Hopefully to step-down soon then home.   Loralie Champagne 07/26/2016 12:37 PM

## 2016-07-26 NOTE — Progress Notes (Addendum)
[facility] DAILY ICU PROGRESS NOTE                   [address]            [place] [address]          [phone]   1 Day Post-Op Procedure(s) (LRB): BiV ICD Insertion CRT-D (N/A)  Total Length of Stay:  LOS: 15 days   Subjective: Patient eating breakfast this am. Has not had a bowel movement in several days.  Objective: Vital signs in last 24 hours: Temp:  [98.1 F (36.7 C)-99 F (37.2 C)] 98.5 F (36.9 C) (10/25 0754) Pulse Rate:  [0-126] 104 (10/25 0700) Cardiac Rhythm: Ventricular paced (10/25 0400) Resp:  [0-54] 24 (10/25 0700) BP: (89-156)/(60-94) 140/84 (10/25 0600) SpO2:  [0 %-99 %] 98 % (10/25 0700) Weight:  [190 lb 14.7 oz (86.6 kg)] 190 lb 14.7 oz (86.6 kg) (10/25 0500)  Filed Weights   07/24/16 0600 07/25/16 0500 07/26/16 0500  Weight: 192 lb 11.2 oz (87.4 kg) 192 lb 3.9 oz (87.2 kg) 190 lb 14.7 oz (86.6 kg)    Weight change: -1 lb 5.2 oz (-0.6 kg)   Hemodynamic parameters for last 24 hours: CVP:  [3 mmHg-6 mmHg] 5 mmHg  Intake/Output from previous day: 10/24 0701 - 10/25 0700 In: 257 [P.O.:60; I.V.:97; IV Piggyback:100] Out: [id number] [Urine:1725]  Intake/Output this shift: No intake/output data recorded.  Current Meds: Scheduled Meds: . amiodarone  200 mg Oral BID  . bisacodyl  10 mg Oral Daily   Or  . bisacodyl  10 mg Rectal Daily  . digoxin  0.125 mg Oral Daily  . feeding supplement (ENSURE ENLIVE)  237 mL Oral BID BM  . furosemide  40 mg Oral BID  . insulin aspart  0-15 Units Subcutaneous TID WC  . rosuvastatin  10 mg Oral QHS  . sacubitril-valsartan  1 tablet Oral BID  . spironolactone  25 mg Oral Daily  . warfarin  5 mg Oral q1800  . Warfarin - Physician Dosing Inpatient   Does not apply q1800   Continuous Infusions: . sodium chloride Stopped (07/19/16 1344)  . milrinone 0.125 mcg/kg/min (07/26/16 0400)   PRN Meds:.acetaminophen, fentaNYL (SUBLIMAZE) injection, Influenza vac split quadrivalent PF, metoprolol, ondansetron  (ZOFRAN) IV, ondansetron (ZOFRAN) IV, sodium chloride flush, sodium chloride flush  General appearance: alert, cooperative and no distress Neurologic: intact Heart: Tachy, paced Lungs: clear to auscultation bilaterally Abdomen: soft, non-tender; bowel sounds normal; no masses,  no organomegaly Extremities: Bilateral LE edema Wound: Clean and dry  Lab Results: CBC: Recent Labs  07/24/16 0223 07/25/16 0433  WBC 14.4* 10.9*  HGB 9.6* 9.2*  HCT 29.7* 28.8*  PLT 195 192   BMET:  Recent Labs  07/25/16 0433 07/26/16 0600  NA 133* 135  K 4.0 4.1  CL 95* 97*  CO2 30 29  GLUCOSE 153* 148*  BUN 20 18  CREATININE 1.38* 1.29*  CALCIUM 8.2* 8.6*    CMET: Lab Results  Component Value Date   WBC 10.9 (H) 07/25/2016   HGB 9.2 (L) 07/25/2016   HCT 28.8 (L) 07/25/2016   PLT 192 07/25/2016   GLUCOSE 148 (H) 07/26/2016   CHOL 95 (L) 04/20/2016   TRIG 99 07/17/2016   HDL 28 (L) 04/20/2016   LDLCALC 45 04/20/2016   ALT 37 07/23/2016   AST 30 07/23/2016   NA 135 07/26/2016   K 4.1 07/26/2016   CL 97 (L) 07/26/2016   CREATININE 1.29 (  H) 07/26/2016   BUN 18 07/26/2016   CO2 29 07/26/2016   TSH 3.729 06/21/2016   INR 1.79 07/26/2016   HGBA1C 6.0 (H) 07/07/2016    PT/INR:  Recent Labs  07/26/16 0600  LABPROT 21.0*  INR 1.79   Radiology: Dg Chest 2 View  Result Date: 07/26/2016 CLINICAL DATA:  Cardiac device in place. EXAM: CHEST  2 VIEW COMPARISON:  Radiographs of July 24, 2016. FINDINGS: Stable cardiomegaly. Status post coronary artery bypass graft. Interval placement of left-sided pacemaker with leads in grossly good position. Right-sided PICC line is unchanged in position. No pneumothorax is noted. Minimal bilateral pleural effusions are noted. Status post cardiac valve repair. Bony thorax is unremarkable. IMPRESSION: Interval placement of left-sided pacemaker with leads in grossly good position. Aortic atherosclerosis. Minimal bilateral pleural effusions.  Electronically Signed   By: Marijo Conception, M.D.   On: 07/26/2016 08:40     Assessment/Plan: S/P Procedure(s) (LRB): BiV ICD Insertion CRT-D (N/A)   1. CV-AV paced. S/p CRT-D yesterday. On Milrinone drip, Amiodarone 200 mg bid, Digoxin 0.125 mg daily, Spirolactone 25 mg daily, and Coumadin. Co ox this am decreased to 67.1. May need to increase Milrinone.INR decreased from 2.31 to 1.79. Given 5 mg of Coumadin last night and will continue with that dose today. 2. Pulmonary- On 2 liters of oxygen via [place]. CXR this am shows small bilateral pleural effusions and no pneumothorax. Encourage incentive spirometer. 3. Acute on chronic combined systolic and diastolic CHF- On Lasix 40 mg bid and Entresto po bid. 4. Creatinine decreased from 1.38 to 1.29. 5. Patient wants to wait on laxative today 6. Will discuss with Dr. Roxy Manns timing of removal of external pacing wires.  Nani Skillern PA-C 07/26/2016 8:53 AM   I have seen and examined the patient and agree with the assessment and plan as outlined.  D/C pacing wires.  Otherwise plan per Dr. Aundra Dubin and advanced heart failure team.  Possible transfer to step down soon.   Rexene Alberts, MD 07/26/2016 5:18 PM

## 2016-07-26 NOTE — Progress Notes (Signed)
Speech Language Pathology Treatment: Dysphagia  Patient Details Name: Christian Bowman MRN: [id number] DOB: April 30, 1946 Today's Date: 07/26/2016 Time: 1100-1115 SLP Time Calculation (min) (ACUTE ONLY): 15 min  Assessment / Plan / Recommendation Clinical Impression  Pt seen to assess diet tolerance since advancement to regular textures and thin liquids during last SLP visit. Pt and wife report good tolerance of current textures, although pt does say he feels like he is having "upper GI" issues. He attributes this primarily to feeling like he has excess gas, but does not think that it interferes with his swallowing abilities. SLP provided skilled observation with thin liquids trials as pt consumed Ensure via straw sips. He had intermittent wet vocal quality that cleared with spontaneous throat clearing. Despite possible penetration, pt appears to be managing this well as he remains afebrile and with lung sounds unchanged. Recommend to continue with current diet. No further SLP f/u indicated.   HPI HPI: Patient is a 70 year old male who underwent redo coronary artery bypass grafting 2 and mitral valve replacement on 10/10.  Bleeding noted after surgery, returned for surgical reexploration. Extubated 10/11. Noted to have some coughing with liquids that evening.       SLP Plan  All goals met     Recommendations  Diet recommendations: Regular;Thin liquid Liquids provided via: Cup;Straw Medication Administration: Whole meds with puree Supervision: Patient able to self feed;Full supervision/cueing for compensatory strategies Compensations: Slow rate;Small sips/bites;Multiple dry swallows after each bite/sip;Follow solids with liquid;Clear throat intermittently Postural Changes and/or Swallow Maneuvers: Seated upright 90 degrees;Upright 30-60 min after meal                Oral Care Recommendations: Oral care BID Follow up Recommendations: None Plan: All goals met       GO                 Christian Bowman 07/26/2016, 12:04 PM  Christian Bowman, M.A. CCC-SLP [phone]

## 2016-07-26 NOTE — Progress Notes (Signed)
Patient Name: Christian Bowman Date of Encounter: 07/26/2016  Primary Cardiologist: [facility] Problem List     Principal Problem:   S/P mitral valve replacement with bioprosthetic valve + redo CABG x2 Active Problems:   HTN (hypertension)   Hyperlipidemia   Type 2 diabetes mellitus with vascular disease (HCC)   Chronic combined systolic and diastolic heart failure (HCC)   Mitral regurgitation   Coronary artery disease involving coronary bypass graft   S/P CABG x 4   Left main coronary artery disease   Coronary artery disease involving native coronary artery   S/P redo CABG x 2   Acute on chronic systolic CHF (congestive heart failure) (HCC)   Typical atrial flutter (HCC)   Protein-calorie malnutrition, severe     Subjective   CRT-P placed yesterday without issues.  Feeling improved over the last few days as has been eating better and getting sleep.  Inpatient Medications    Scheduled Meds: . amiodarone  200 mg Oral BID  . bisacodyl  10 mg Oral Daily   Or  . bisacodyl  10 mg Rectal Daily  . digoxin  0.125 mg Oral Daily  . feeding supplement (ENSURE ENLIVE)  237 mL Oral BID BM  . furosemide  40 mg Oral BID  . insulin aspart  0-15 Units Subcutaneous TID WC  . rosuvastatin  10 mg Oral QHS  . sacubitril-valsartan  1 tablet Oral BID  . spironolactone  25 mg Oral Daily  . warfarin  5 mg Oral q1800  . Warfarin - Physician Dosing Inpatient   Does not apply q1800   Continuous Infusions: . sodium chloride Stopped (07/19/16 1344)   PRN Meds: acetaminophen, fentaNYL (SUBLIMAZE) injection, Influenza vac split quadrivalent PF, metoprolol, ondansetron (ZOFRAN) IV, ondansetron (ZOFRAN) IV, sodium chloride flush, sodium chloride flush   Vital Signs    Vitals:   07/26/16 0500 07/26/16 0600 07/26/16 0700 07/26/16 0754  BP:  140/84    Pulse: (!) 107 (!) 105 (!) 104   Resp: (!) 24 (!) 9 (!) 24   Temp:    98.5 F (36.9 C)  TempSrc:    Oral  SpO2: 97% 95% 98%   Weight:  190 lb 14.7 oz (86.6 kg)     Height:        Intake/Output Summary (Last 24 hours) at 07/26/16 0932 Last data filed at 07/26/16 0700  Gross per 24 hour  Intake           256.97 ml  Output             1725 ml  Net         -1468.03 ml   Filed Weights   07/24/16 0600 07/25/16 0500 07/26/16 0500  Weight: 192 lb 11.2 oz (87.4 kg) 192 lb 3.9 oz (87.2 kg) 190 lb 14.7 oz (86.6 kg)    Physical Exam    GEN: Well nourished, well developed, in no acute distress.  HEENT: Grossly normal.  Neck: Supple, no JVD, carotid bruits, or masses. Cardiac: RRR, no murmurs, rubs, or gallops. No clubbing, cyanosis, edema.  Radials/DP/PT 2+ and equal bilaterally.  Respiratory:  Respirations regular and unlabored, clear to auscultation bilaterally. GI: Soft, nontender, nondistended, BS + x 4. MS: no deformity or atrophy. Skin: warm and dry, no rash. Sternal wound well healed. Neuro:  Strength and sensation are intact. Psych: AAOx3.  Normal affect.  Labs    CBC  Recent Labs  07/24/16 0223 07/25/16 0433  WBC 14.4* 10.9*  HGB 9.6* 9.2*  HCT 29.7* 28.8*  MCV 88.9 89.4  PLT 195 [id number]   Basic Metabolic Panel  Recent Labs  07/25/16 0433 07/26/16 0600  NA 133* 135  K 4.0 4.1  CL 95* 97*  CO2 30 29  GLUCOSE 153* 148*  BUN 20 18  CREATININE 1.38* 1.29*  CALCIUM 8.2* 8.6*   Liver Function Tests No results for input(s): AST, ALT, ALKPHOS, BILITOT, PROT, ALBUMIN in the last 72 hours. No results for input(s): LIPASE, AMYLASE in the last 72 hours. Cardiac Enzymes No results for input(s): CKTOTAL, CKMB, CKMBINDEX, TROPONINI in the last 72 hours. BNP Invalid input(s): POCBNP D-Dimer No results for input(s): DDIMER in the last 72 hours. Hemoglobin A1C No results for input(s): HGBA1C in the last 72 hours. Fasting Lipid Panel No results for input(s): CHOL, HDL, LDLCALC, TRIG, CHOLHDL, LDLDIRECT in the last 72 hours. Thyroid Function Tests No results for input(s): TSH, T4TOTAL, T3FREE, THYROIDAB  in the last 72 hours.  Invalid input(s): FREET3  Telemetry    V paced - Personally Reviewed  ECG    V paced - Personally Reviewed  Radiology    Dg Chest 2 View  Result Date: 07/26/2016 CLINICAL DATA:  Cardiac device in place. EXAM: CHEST  2 VIEW COMPARISON:  Radiographs of July 24, 2016. FINDINGS: Stable cardiomegaly. Status post coronary artery bypass graft. Interval placement of left-sided pacemaker with leads in grossly good position. Right-sided PICC line is unchanged in position. No pneumothorax is noted. Minimal bilateral pleural effusions are noted. Status post cardiac valve repair. Bony thorax is unremarkable. IMPRESSION: Interval placement of left-sided pacemaker with leads in grossly good position. Aortic atherosclerosis. Minimal bilateral pleural effusions. Electronically Signed   By: Marijo Conception, M.D.   On: 07/26/2016 08:40    Cardiac Studies   - Severe global reduction in LV functon; severe LVE; s/p MVR with   trace MR; moderate LAE; no LAA thrombus; moderate RV   diysfunction; mild to moderate TR.   Assessment & Plan    1. Complete AV block: Is status post CRT P. Device interrogation and chest x-ray without major abnormality. We Taje Littler set him up to follow up in device clinic in 10 days.  2. Ischemic cardiomyopathy: Ejection fraction 25-30%. Continue current therapy  3. Atrial flutter: Require cardioversion to sinus rhythm yesterday.  Continue coumadin. We'll start him on amiodarone today. Would continue his amiodarone.  Signed, Sharan Mcenaney Meredith Leeds, MD  07/26/2016, 9:32 AM

## 2016-07-27 LAB — GLUCOSE, CAPILLARY
Glucose-Capillary: 174 mg/dL — ABNORMAL HIGH (ref 65–99)
Glucose-Capillary: 156 mg/dL — ABNORMAL HIGH (ref 65–99)
Glucose-Capillary: 217 mg/dL — ABNORMAL HIGH (ref 65–99)
Glucose-Capillary: 124 mg/dL — ABNORMAL HIGH (ref 65–99)

## 2016-07-27 LAB — BASIC METABOLIC PANEL
ANION GAP: 10 (ref 5–15)
BUN: 23 mg/dL — AB (ref 6–20)
CHLORIDE: 96 mmol/L — AB (ref 101–111)
CO2: 27 mmol/L (ref 22–32)
Calcium: 8.3 mg/dL — ABNORMAL LOW (ref 8.9–10.3)
Creatinine, Ser: 1.26 mg/dL — ABNORMAL HIGH (ref 0.61–1.24)
GFR calc Af Amer: >60 mL/min (ref >60)
GFR calc non Af Amer: 57 mL/min — ABNORMAL LOW (ref >60)
GLUCOSE: 145 mg/dL — AB (ref 65–99)
POTASSIUM: 4.2 mmol/L (ref 3.5–5.1)
Sodium: 133 mmol/L — ABNORMAL LOW (ref 135–145)

## 2016-07-27 LAB — CBC
HEMATOCRIT: 33.1 % — AB (ref 39.0–52.0)
HEMOGLOBIN: 10.7 g/dL — AB (ref 13.0–17.0)
MCH: 28.7 pg (ref 26.0–34.0)
MCHC: 32.3 g/dL (ref 30.0–36.0)
MCV: 88.7 fL (ref 78.0–100.0)
Platelets: 227 10*3/uL (ref 150–400)
RBC: 3.73 MIL/uL — ABNORMAL LOW (ref 4.22–5.81)
RDW: 15.4 % (ref 11.5–15.5)
WBC: 11.2 10*3/uL — ABNORMAL HIGH (ref 4.0–10.5)

## 2016-07-27 LAB — COOXEMETRY PANEL
Carboxyhemoglobin: 1.4 % (ref 0.5–1.5)
Methemoglobin: 0.9 % (ref 0.0–1.5)
O2 Saturation: 62.7 %
TOTAL HEMOGLOBIN: 10.8 g/dL — AB (ref 12.0–16.0)

## 2016-07-27 LAB — PROTIME-INR
INR: 1.59
PROTHROMBIN TIME: 19.2 s — AB (ref 11.4–15.2)

## 2016-07-27 MED ORDER — SODIUM CHLORIDE 0.9 % IV SOLN: 250.0000 mL | INTRAVENOUS | Status: DC | PRN

## 2016-07-27 MED ORDER — PANTOPRAZOLE SODIUM 40 MG PO TBEC
40.0000 mg | DELAYED_RELEASE_TABLET | Freq: Every day | ORAL | Status: DC
  Administered 2016-07-28 – 2016-08-01 (×5): 40 mg via ORAL
  Filled 2016-07-27 (×5): qty 1

## 2016-07-27 MED ORDER — WARFARIN SODIUM 7.5 MG PO TABS
7.5000 mg | ORAL_TABLET | Freq: Every day | ORAL | Status: DC
Start: 2016-07-27 — End: 2016-07-30
  Administered 2016-07-27 – 2016-07-29 (×3): 7.5 mg via ORAL
  Filled 2016-07-27 (×3): qty 1

## 2016-07-27 MED ORDER — CARVEDILOL 3.125 MG PO TABS
3.1250 mg | ORAL_TABLET | Freq: Two times a day (BID) | ORAL | Status: DC
  Administered 2016-07-27 – 2016-07-28 (×2): 3.125 mg via ORAL
  Filled 2016-07-27 (×2): qty 1

## 2016-07-27 MED ORDER — MOVING RIGHT ALONG BOOK
Freq: Once | Status: AC
  Administered 2016-07-27: 1
  Filled 2016-07-27: qty 1

## 2016-07-27 MED ORDER — OXYCODONE HCL 5 MG PO TABS
5.0000 mg | ORAL_TABLET | ORAL | Status: DC | PRN
  Administered 2016-07-27 – 2016-08-01 (×8): 10 mg via ORAL
  Filled 2016-07-27 (×8): qty 2

## 2016-07-27 MED ORDER — METFORMIN HCL 500 MG PO TABS
500.0000 mg | ORAL_TABLET | Freq: Two times a day (BID) | ORAL | Status: DC
  Administered 2016-07-27 – 2016-08-01 (×10): 500 mg via ORAL
  Filled 2016-07-27 (×9): qty 1

## 2016-07-27 MED ORDER — SODIUM CHLORIDE 0.9% FLUSH
3.0000 mL | INTRAVENOUS | Status: DC | PRN
Start: 2016-07-27 — End: 2016-08-01

## 2016-07-27 MED ORDER — SODIUM CHLORIDE 0.9% FLUSH
10.0000 mL | INTRAVENOUS | Status: DC | PRN
  Administered 2016-07-28 – 2016-07-30 (×4): 10 mL
  Administered 2016-07-31: 20 mL
  Administered 2016-08-01: 10 mL
  Filled 2016-07-27 (×6): qty 40

## 2016-07-27 MED ORDER — SODIUM CHLORIDE 0.9% FLUSH
3.0000 mL | Freq: Two times a day (BID) | INTRAVENOUS | Status: DC
  Administered 2016-07-27 – 2016-07-31 (×3): 3 mL via INTRAVENOUS

## 2016-07-27 MED ORDER — TRAMADOL HCL 50 MG PO TABS: 50.0000 mg | ORAL_TABLET | ORAL | Status: DC | PRN

## 2016-07-27 NOTE — Discharge Summary (Signed)
Physician Discharge Summary       [address]       [place] [address]             [phone]    Patient ID: Christian Bowman MRN: [id number] DOB/AGE: [id number] y.o.  Admit date: 07/11/2016 Discharge date: 08/01/2016  Admission Diagnoses:  Discharge Diagnoses:  Principal Problem:   S/P mitral valve replacement with bioprosthetic valve + redo CABG x2 Active Problems:   HTN (hypertension)   Hyperlipidemia   Type 2 diabetes mellitus with vascular disease (HCC)   Chronic combined systolic and diastolic heart failure (HCC)   Mitral regurgitation   Coronary artery disease involving coronary bypass graft   S/P CABG x 4   Left main coronary artery disease   Coronary artery disease involving native coronary artery   S/P redo CABG x 2   Acute on chronic systolic CHF (congestive heart failure) (HCC)   Typical atrial flutter (HCC)   Protein-calorie malnutrition, severe   Consults: Heart failure and EPS  Procedure (s):   Redo Coronary Artery Bypass Grafting x 2              Redo Median Sternotomy             Saphenous Vein Graft to Posterior Descending Coronary Artery             Saphenous Vein Graft to Intermediate Branch Coronary Artery             Endoscopic Vein Harvest from Left Thigh              Placement of Femoral Arterial Line   Mitral Valve Replacement             Edwards Magna Mitral Bovine Bioprosthetic Tissue Valve (size 78m, model #[id number], serial ##[id number] by Dr. ORoxy Mannson 07/11/2016.  Re-exploration for Bleeding on 07/11/2016 by Dr. ORoxy Manns History of Presenting Illness: Patient is a 70year old male with history of coronary artery disease status post coronary artery bypass grafting 4 in 2007, ischemic cardiomyopathy with chronic combined systolic and diastolic congestive heart failure, mitral regurgitation, hypertension, type 2 diabetes mellitus, and hyperlipidemia who has been referred for surgical consultation to discuss treatment options  for management of severe multivessel coronary artery disease, vein graft disease, and ischemic mitral regurgitation. The patient's cardiac history dates back to 2007 when he originally presented with hypertensive crisis with severe hypertension. He was found to have renal artery stenosis and left main and three-vessel coronary artery disease. He underwent coronary artery bypass grafting 4 with grafts placed at time of surgery including left internal mammary artery to the distal left anterior descending coronary artery, saphenous vein graft to the ramus intermediate branch, and sequential saphenous vein graft to the posterior descending coronary artery and the right posterior lateral branch. Postoperatively he developed atrial fibrillation which ultimately required cardioversion. His postoperative recovery was otherwise uneventful. He did well until September 2016 when he presented with acute respiratory failure secondary to acute pulmonary edema from congestive heart failure. At the time he was traveling in [place] was hospitalized there where by report echocardiogram revealed ejection fraction 35% with moderate to severe mitral regurgitation. He underwent cardiac catheterization and was found to have 100% occlusion of all of the main native coronary arteries with continued patency of the left internal mammary artery graft to the distal left anterior descending coronary artery, 100% occlusion of the vein graft to the left circumflex system, and 90% stenosis of  an otherwise patent vein graft to the right coronary system. He underwent PCI and stenting of the vein graft to the right coronary system using a drug eluting stent. Since then the patient has had persistent symptoms of chronic combined systolic and congestive heart failure, and he has been hospitalized on 2 different occasions in [place] for acute exacerbations of class IV symptoms. The patient was recently seen in  follow-up by Dr. Sallyanne Kuster who performed transesophageal echocardiogram on 06/15/2016. TEE revealed moderate to severe left ventricular chamber enlargement with ejection fraction estimated 35-40%. There was severe hypokinesis of the anterolateral and inferolateral myocardium. There was akinesis of the basal mid inferior myocardium. There was severe mitralregurgitation with an eccentric jet of regurgitation directed posteriorly and flow reversal in the pulmonary veins. There was moderate left atrial enlargement. Left and right heart catheterization was performed 06/21/2016 by Dr. Burt Knack. This confirmed the presence of severe left main and three-vessel coronary artery disease with 100% occlusion of all of the native artery arteries. There was continued patency of the left internal mammary artery to the distal left anterior descending coronary artery, although severe disease in the native left anterior descending coronary artery beyond the distal insertion of the graft. There was chronic occlusion of vein graft to the left circumflex system. There was continued patency of vein graft to the right coronarysystem with high-grade 99% stenosis in the midportion of the graft at the site of previous stent insertion. There was mild to moderate pulmonary hypertension with very large V waves on wedge tracing consistent with severe mitral regurgitation. The patient was referred for surgical consultation.  The patient is married and lives in [place] with his wife. He has been retired since 2000. He has remained fairly active physically until the past year where he has been severely limited by exertional shortness of breath. He states that ever since he became ill and was hospitalized in September 2016 he has been severely limited by exertional shortness of breath. He gets short of breath with very mild activity. He still tries to go to cardiac rehabilitation in exercise, but he has to stop and take breaks frequently. He has  had intervening episodes of shortness of breath at rest, PND, and orthopnea. He has been hospitalized twice with acute exacerbations as previously noted, most recently in July of this year. He has had epigastric chest discomfort which he describes as "heartburn-like" symptoms that occur sporadically and are alleviated by administration of aspirin. Symptoms occur as often as once a week and usually resolve within 10-15 minutes. They are not associated with physical activity. The patient has not had palpitations, dizzy spells, or syncope. His appetite is poor and he has lost more than 30 pounds of weight. He has a persistent dry nonproductive cough.  Patient returns to the office today for follow-up of severe left main and three-vessel coronary artery disease, vein graft disease status post coronary artery bypass grafting in 2007, and severe symptomatic secondary mitral regurgitation with symptoms consistent with unstable angina pectoris and chronic combined systolic and diastolic congestive heart failure. He was originally seen in consultation on 06/22/2016. Shortly after that the patient made adecision to proceed with elective high risk redo coronary artery bypass grafting and mitral valve repair or replacement. He returns to the office today with plans to proceed with surgery tomorrow. He reports no new problems or complaints over the last few weeks. He has been careful not to exert himself to strenuously. He has not had any more symptoms of  substernal chest pain or chest tightness. He gets short of breath with moderate level activity but he has been quite comfortable at rest. Appetite is good. Bowel function is normal. He denies any fevers or chills. He has not had a productive cough. He stopped taking Plavix last week in anticipation of surgery.  Dr. Roxy Manns has again reviewed the indications, risks, and potential benefits of redo coronary artery bypass grafting with mitral valve repair or replacement with  the patient and his wife in the office today. Alternative treatment strategies to been discussed. Long-term prognosis with medical therapy has been discussed. The relatively high risks associated with surgery have been discussed. The likelihood of successful and durable valve repair has been discussed with particular reference to the findings of their recent echocardiogram. Based upon these findings and previous experience, I have quoted them a 50percent likelihood of successful valve repair. In the unlikely event that their valve cannot be successfully repaired, we discussed the possibility of replacing the mitral valve using a mechanical prosthesis with the attendant need for long-term anticoagulation versus the alternative of replacing it using a bioprosthetic tissue valve with its potential for late structural valve deterioration and failure, depending upon the patient's longevity. The patient specifically requests that if the mitral valve must be replaced that it be done using a bioprosthetic tissuevalve.   Preoperative carotid duplex US showed no significant internal carotid artery stenosis bilaterally. He was admitted on 07/11/2016 in order to undergo a redo sternotomy, CABG x 2 and mitral valve replacement by Dr. Roxy Manns.  Brief Hospital Course:   He was taken to the operating room.  He underwent Redo Sternotomy, CABG x 2 utilizing SVG to PDA and SVG to Intermediate brach, and Mitral Valve replacement with a 31 mm Edwards Magna Mitral Bioprosthetic tissue valve.  He tolerated the procedure and was taken to the SICU in stable condition.  However, on arrival to the SICU the patients chest tube output was too high.  He was taken back to the OR for re-exploration for post operative bleeding.  Bleeding was identified from the medial surface of the left upper lobe along the Left internal mammary artery pedicle.  This was repaired and bleeding resolved.  He was taken back to the SICU in stable condition.  During his stay in the SICU the patient was weaned and extubated on POD #1.  He was weaned off Milrinone, Levophed, and Dopamine as tolerated. He required back up pacing as he was in NSR with complete heart block post operatively.  He was started on Lasix drip for hypervolemia.  He was started on coumadin for his mitral valve replacement.  He developed difficulty swallowing liquids due to coughing.  It was felt SLP evaluation should be completed.  He was found to be a severe Aspiration risk and was made NPO.  Modified barium swallow was later performed which was concerning for esophageal component of swallowing dysfunction.  He had a feeding tube placed and was started on tube feeds due to prolonged NPO status.  However, due to severe gag reflex with NG tube in place, patient was unable to tolerate tube feeds.  Ultimately he was started on TPN.  Further SLP evaluation showed resolution of aspiration risk and his diet was progressed slowly.  He converted from CHB to Atrial Flutter he was treated with IV amiodarone, but later developed bradycardia and Amiodarone was discontinued.  Ultimately Advance heart failure consult was obtained.  They were in agreement with increase in Milrinone dose and  recommended additional IV diuresis.  He was also started on ARB therapy and Spironolactone. The patients HR remained in Atrial flutter.  He was felt to benefit from Cardioversion.  This was done on POD #8 and converted patient to NSR with underlying heart block.  His temporary pacer was set to DDD pacing and EP consult was obtained. He was weaned off Milrinone but developed drop in Co-ox and it was restarted.  He was evaluated by Dr. Curt Bears with EP who felt the patient had not shown any evidence of recovery to his conduction system.  He felt PPM placement was indicated.  The risks and benefits of the procedure were explained to the patient and he was agreeable to proceed.  The patient showed minimal response to his coumadin  therapy.  His dosage was adjusted as tolerated.  Once his INR was > 2 he was able to have his PPM placed.  This was done on 07/25/2016. His temporary pacing wires were removed without difficulty. The patient continued to make progress post PPM placement.  His Co-ox improved and his milrinone was discontinued.  He remained on Digoxin, Spironolactone, and was started on Entresto.  He remained on Heparin drip and Coumadin for his mitral valve.  He is currently on Coumadin 7.5 mg daily. His INR is stable at 1.72.  He will be on a Lovenox bridge until his INR is >2.0.   His Co-OX is stable at 66%.  His pacing wires have been removed without difficulty.  He is ambulating independently.  He was felt medically stable for discharge home today.  Latest Vital Signs: Blood pressure (!) 125/57, pulse 82, temperature 97.6 F (36.4 C), temperature source Oral, resp. rate 18, height 6' (1.829 m), weight 186 lb 14.4 oz (84.8 kg), SpO2 96 %.  Physical Exam: General appearance: Alert, cooperative and no distress Neurologic: Intact Heart: Paced Lungs: Clear to auscultation bilaterally Abdomen: Soft, non-tender; bowel sounds normal; no masses,  no organomegaly Extremities: Bilateral LE edema Wound: Clean and dry  Discharge Condition:Stable and discharged to home.  Recent laboratory studies:  Lab Results  Component Value Date   WBC 8.2 07/31/2016   HGB 9.8 (L) 07/31/2016   HCT 31.4 (L) 07/31/2016   MCV 91.0 07/31/2016   PLT 216 07/31/2016   Lab Results  Component Value Date   NA 136 08/01/2016   K 4.3 08/01/2016   CL 98 (L) 08/01/2016   CO2 31 08/01/2016   CREATININE 1.39 (H) 08/01/2016   GLUCOSE 119 (H) 08/01/2016   Diagnostic Studies: Dg Chest 2 View  Result Date: 07/26/2016 CLINICAL DATA:  Cardiac device in place. EXAM: CHEST  2 VIEW COMPARISON:  Radiographs of July 24, 2016. FINDINGS: Stable cardiomegaly. Status post coronary artery bypass graft. Interval placement of left-sided pacemaker with  leads in grossly good position. Right-sided PICC line is unchanged in position. No pneumothorax is noted. Minimal bilateral pleural effusions are noted. Status post cardiac valve repair. Bony thorax is unremarkable. IMPRESSION: Interval placement of left-sided pacemaker with leads in grossly good position. Aortic atherosclerosis. Minimal bilateral pleural effusions. Electronically Signed   By: Marijo Conception, M.D.   On: 07/26/2016 08:40   Ct Chest Wo Contrast  Result Date: 07/07/2016 CLINICAL DATA:  70 year old male under preoperative evaluation prior to mitral valve repair. Chest discomfort for the past 2 months. Possible history of gastroesophageal reflux disease. Shortness of breath on exertion. EXAM: CT CHEST WITHOUT CONTRAST TECHNIQUE: Multidetector CT imaging of the chest was performed following the standard protocol  without IV contrast. COMPARISON:  No prior chest CT. FINDINGS: Cardiovascular: Heart size is enlarged with left ventricular and left atrial dilatation. There is no significant pericardial fluid, thickening or pericardial calcification. There is aortic atherosclerosis, as well as atherosclerosis of the great vessels of the mediastinum and the coronary arteries, including calcified atherosclerotic plaque in the left main, left anterior descending, left circumflex and right coronary arteries. Relatively minimal calcified atherosclerotic plaque in the ascending thoracic aorta. Status post median sternotomy for CABG, including LIMA to the LAD. Dilatation of the pulmonic trunk (4.1 cm in diameter), suggestive of underlying pulmonary arterial hypertension. Mediastinum/Nodes: No pathologically enlarged mediastinal or hilar lymph nodes. Please note that accurate exclusion of hilar adenopathy is limited on noncontrast CT scans. Esophagus is unremarkable in appearance. No axillary lymphadenopathy. Lungs/Pleura: No suspicious appearing pulmonary nodules or masses. There is no acute consolidative airspace  disease. No pleural effusions. A few areas of mild linear scarring are noted, predominantly in the lung bases. Upper Abdomen: Low-attenuation lesions in the upper pole of the left kidney are incompletely characterized on today's noncontrast CT examination, but were previously characterized as simple cysts on prior abdominal MRI 07/23/2015. Aortic atherosclerosis. Musculoskeletal: Median sternotomy wires. There are no aggressive appearing lytic or blastic lesions noted in the visualized portions of the skeleton. IMPRESSION: 1. Cardiomegaly with left ventricular and left atrial dilatation. 2. Dilatation of the pulmonic trunk, suggestive of underlying pulmonary arterial hypertension. 3. Aortic atherosclerosis, in addition to left main and 3 vessel coronary artery disease. Status post median sternotomy for CABG, including LIMA to the LAD. Notably, there is relatively little calcified atherosclerotic plaque in the ascending thoracic aorta (which may be relevant to potential cross clamp procedure). 4. Additional incidental findings, as above. Electronically Signed   By: Vinnie Langton M.D.   On: 07/07/2016 14:40   Dg Esophagus  Result Date: 07/16/2016 CLINICAL DATA:  Assess for esophageal motility or injury after surgery. Difficulty swallowing. EXAM: ESOPHOGRAM/BARIUM SWALLOW TECHNIQUE: Single contrast examination was performed using  Isovue. FLUOROSCOPY TIME:  Fluoroscopy Time:  2 minutes and 48 seconds Number of Acquired Spot Images: 0, multiple saved images were obtained. COMPARISON:  None. FINDINGS: The esophagus is normal in caliber along its length with no evidence of stricture or obstruction. A barium tablet was not utilized. No evidence of leak. Limited views of the stomach and proximal duodenum are normal. IMPRESSION: 1. No evidence of esophageal injury or dysmotility. Electronically Signed   By: Dorise Bullion III M.D   On: 07/16/2016 16:17   Dg Swallowing Func-speech Pathology  Result Date:  07/14/2016 Objective Swallowing Evaluation: Type of Study: MBS-Modified Barium Swallow Study Patient Details Name: THELMA VIANA MRN: [id number] Date of Birth: 1946-03-31 Today's Date: 07/14/2016 Time: SLP Start Time (ACUTE ONLY): 1411-SLP Stop Time (ACUTE ONLY): 1435 SLP Time Calculation (min) (ACUTE ONLY): 24 min Past Medical History: Past Medical History: Diagnosis Date . Anginal pain ([place])  . CAD (coronary artery disease)  . Coronary artery disease involving coronary bypass graft  . Cyst of neck   right side . DM2 (diabetes mellitus, type 2) ([place]) 08/26/2013 . Dyspnea  . Heart attack  . HTN (hypertension) 08/26/2013 . Hyperlipidemia 08/26/2013 . Left main coronary artery disease  . Left renal artery stenosis ([place])   Genesis 6x12 stent 2007 . Obesity (BMI 30.0-34.9) 08/26/2013 . Postoperative atrial fibrillation ([place]) 10/15/2005 . S/P CABG x 4 10/13/2005  LIMA to LAD, SVG to intermediate branch, sequential SVG to PDA and RPL branch, EVH via right thigh .  S/P mitral valve replacement with bioprosthetic valve 07/11/2016  31 mm [facility] Mitral bovine bioprosthetic tissue valve . S/P redo CABG x 2 07/11/2016  SVG to PDA and SVG to Intermediate Branch, EVH via left thigh Past Surgical History: Past Surgical History: Procedure Laterality Date . CARDIAC CATHETERIZATION N/A 06/21/2016  Procedure: Right/Left Heart Cath and Coronary/Graft Angiography;  Surgeon: Sherren Mocha, MD;  Location: [facility];  Service: Cardiovascular;  Laterality: N/A; . CORONARY ARTERY BYPASS GRAFT  10/13/2005  LIMA to LAD, SVG to intermediate branch, sequential SVG to PDA and RPL . CORONARY ARTERY BYPASS GRAFT N/A 07/11/2016  Procedure: REDO CORONARY ARTERY BYPASS GRAFTING (CABG) x two using left leg greater saphenous vein harvested endoscopically-SVG to PDA -SVG to RAMUS INTERMEDIATE;  Surgeon: Rexene Alberts, MD;  Location: [facility];  Service: Open Heart Surgery;  Laterality: N/A; . CORONARY ARTERY BYPASS GRAFT N/A 07/11/2016   Procedure: Re-exploration (CABG) for post op bleeding,;  Surgeon: Rexene Alberts, MD;  Location: [facility];  Service: Open Heart Surgery;  Laterality: N/A; . MITRAL VALVE REPLACEMENT N/A 07/11/2016  Procedure: MITRAL VALVE (MV) REPLACEMENT;  Surgeon: Rexene Alberts, MD;  Location: [facility];  Service: Open Heart Surgery;  Laterality: N/A; . MYOCARDICAL PERFUSION  10/09/2007  NORMAL PERFUSION IN ALL REGIONS;NO EVIDENCE OF INDUCIBLE ISCHEMIA;POST STRESS EF% 66 . PERCUTANEOUS CORONARY STENT INTERVENTION (PCI-S)    DES in SVG to right coronary artery system . RENAL ARTERY STENT Right 2007 . RENAL DOPPLER  03/28/2010  RIGHT RA-NORMAL;LEFT PROXIMAL RA AT STENT-PATENT WITH NO EVIDENCE OF SIGN DIAMETER REDUCTION. R & L KIDNEYS: EQUAL IN SIZE,SYMMETRICAL IN SHAPE. . TEE WITHOUT CARDIOVERSION N/A 06/15/2016  Procedure: TRANSESOPHAGEAL ECHOCARDIOGRAM (TEE);  Surgeon: Sanda Klein, MD;  Location: [facility];  Service: Cardiovascular;  Laterality: N/A; . TEE WITHOUT CARDIOVERSION N/A 07/11/2016  Procedure: TRANSESOPHAGEAL ECHOCARDIOGRAM (TEE);  Surgeon: Rexene Alberts, MD;  Location: [facility];  Service: Open Heart Surgery;  Laterality: N/A; . TRANSESOPHAGEAL ECHOCARDIOGRAM  10/19/2005  NORMAL LV; MILD TO MODERATE AMOUNT OF SOFT ATHEROMATOUS PLAQUE OF THE THORACIC AORTA; THE LEFT ATRIUM IS MILDLY DILATED;LEFT ATRIAL APPENDAGE FUNCTION IS NORMAL;NO THROMBUS IDENTIFIED. SMALL PFO WITH RIGHT TO LEFT SHUNT HPI: Patient is a 70 year old male who underwent redo coronary artery bypass grafting 2 and mitral valve replacement on 10/10.  Bleeding noted after surgery, returned for surgical reexploration. Extubated 10/11. Noted to have some coughing with liquids that evening.  Subjective: pt alert, seems somewhat fatigued but very alert Assessment / Plan / Recommendation CHL IP CLINICAL IMPRESSIONS 07/14/2016 Therapy Diagnosis Moderate pharyngeal phase dysphagia;Moderate cervical esophageal phase dysphagia;Suspected primary esophageal dysphagia  Clinical Impression Pt continues to have a moderate pharyngeal dysphagia, although with suspected cervical esophageal and esophageal components contributing as well. Pt had one instance of silent aspiration due to premature spillage of large bolus of nectar thick liquids, but otherwise his airway protection during the swallow was good. Additional silent penetration did occur after the swallow on residue that remains primarily at his UES with smaller amounts in his valleculae. While some residue may likely be attributed to reduced hyolaryngeal movement, pt also has esophageal backflow with all consistencies tested that flows back into the pharynx. Given his decreased sensation and weak cough, he is at a higher risk for developing an aspiration-related infection. Upon further scan of his esophagus he did appear to have barium remaining throughout his esophagus after minimal amounts of PO intake (MD not present to confirm). Continue to suspect that pt needs additional time to  rebuild his overall strength to facilitate pharyngeal function; however, MD may also wish to pursue possible esophageal component as well. For now, would allow ice chips after oral care. Impact on safety and function Severe aspiration risk   CHL IP TREATMENT RECOMMENDATION 07/14/2016 Treatment Recommendations Therapy as outlined in treatment plan below   Prognosis 07/14/2016 Prognosis for Safe Diet Advancement Good Barriers to Reach Goals -- Barriers/Prognosis Comment -- CHL IP DIET RECOMMENDATION 07/14/2016 SLP Diet Recommendations NPO;Ice chips PRN after oral care Liquid Administration via -- Medication Administration Via alternative means Compensations -- Postural Changes --   CHL IP OTHER RECOMMENDATIONS 07/14/2016 Recommended Consults Consider esophageal assessment Oral Care Recommendations Oral care QID Other Recommendations Have oral suction available   CHL IP FOLLOW UP RECOMMENDATIONS 07/14/2016 Follow up Recommendations Inpatient Rehab    CHL IP FREQUENCY AND DURATION 07/14/2016 Speech Therapy Frequency (ACUTE ONLY) min 2x/week Treatment Duration 2 weeks      CHL IP ORAL PHASE 07/14/2016 Oral Phase Impaired Oral - Pudding Teaspoon -- Oral - Pudding Cup -- Oral - Honey Teaspoon -- Oral - Honey Cup -- Oral - Nectar Teaspoon WFL Oral - Nectar Cup Premature spillage Oral - Nectar Straw -- Oral - Thin Teaspoon -- Oral - Thin Cup -- Oral - Thin Straw -- Oral - Puree Delayed oral transit Oral - Mech Soft -- Oral - Regular -- Oral - Multi-Consistency -- Oral - Pill -- Oral Phase - Comment --  CHL IP PHARYNGEAL PHASE 07/14/2016 Pharyngeal Phase Impaired Pharyngeal- Pudding Teaspoon -- Pharyngeal -- Pharyngeal- Pudding Cup -- Pharyngeal -- Pharyngeal- Honey Teaspoon -- Pharyngeal -- Pharyngeal- Honey Cup -- Pharyngeal -- Pharyngeal- Nectar Teaspoon Reduced anterior laryngeal mobility;Reduced laryngeal elevation;Pharyngeal residue - valleculae;Pharyngeal residue - cp segment Pharyngeal -- Pharyngeal- Nectar Cup Reduced anterior laryngeal mobility;Reduced laryngeal elevation;Pharyngeal residue - valleculae;Pharyngeal residue - cp segment;Penetration/Aspiration before swallow;Compensatory strategies attempted (with notebox) Pharyngeal Material enters airway, passes BELOW cords without attempt by patient to eject out (silent aspiration) Pharyngeal- Nectar Straw -- Pharyngeal -- Pharyngeal- Thin Teaspoon -- Pharyngeal -- Pharyngeal- Thin Cup -- Pharyngeal -- Pharyngeal- Thin Straw -- Pharyngeal -- Pharyngeal- Puree Reduced anterior laryngeal mobility;Reduced laryngeal elevation;Pharyngeal residue - valleculae;Pharyngeal residue - cp segment;Penetration/Apiration after swallow Pharyngeal Material enters airway, remains ABOVE vocal cords and not ejected out Pharyngeal- Mechanical Soft -- Pharyngeal -- Pharyngeal- Regular -- Pharyngeal -- Pharyngeal- Multi-consistency -- Pharyngeal -- Pharyngeal- Pill -- Pharyngeal -- Pharyngeal Comment --  CHL IP CERVICAL ESOPHAGEAL  PHASE 07/14/2016 Cervical Esophageal Phase Impaired Pudding Teaspoon -- Pudding Cup -- Honey Teaspoon -- Honey Cup -- Nectar Teaspoon Esophageal backflow into the pharynx Nectar Cup Esophageal backflow into the pharynx Nectar Straw -- Thin Teaspoon -- Thin Cup -- Thin Straw -- Puree Esophageal backflow into the pharynx Mechanical Soft -- Regular -- Multi-consistency -- Pill -- Cervical Esophageal Comment -- No flowsheet data found. [name] 07/14/2016, 3:16 PM  [name], M.A. CCC-SLP [phone]                 Discharge Instructions    Amb Referral to Cardiac Rehabilitation    Complete by:  As directed    Diagnosis:   CABG Valve Replacement     Valve:  Mitral   CABG X ___:  2      Discharge Medications:   Medication List    STOP taking these medications   amLODipine 2.5 MG tablet Commonly known as:  NORVASC   carvedilol 25 MG tablet Commonly known as:  COREG   clopidogrel 75 MG  tablet Commonly known as:  PLAVIX   lisinopril 40 MG tablet Commonly known as:  PRINIVIL,ZESTRIL     TAKE these medications   acetaminophen 325 MG tablet Commonly known as:  TYLENOL Take 1-2 tablets (325-650 mg total) by mouth every 4 (four) hours as needed for mild pain.   amiodarone 200 MG tablet Commonly known as:  PACERONE Take 1 tablet (200 mg total) by mouth daily.   aspirin EC 81 MG tablet Take 81 mg by mouth every evening.   digoxin 0.125 MG tablet Commonly known as:  LANOXIN Take 0.5 tablets (0.0625 mg total) by mouth daily.   enoxaparin 100 MG/ML injection Commonly known as:  LOVENOX Inject 0.85 mLs (85 mg total) into the skin daily.   enoxaparin Kit Commonly known as:  LOVENOX Please dispense kit as new to taking Lovenox   furosemide 40 MG tablet Commonly known as:  LASIX Take 1 tablet (40 mg total) by mouth 2 (two) times daily.   glucose blood test strip Commonly known as:  ACCU-CHEK AVIVA Use as instructed bid E11.65   metFORMIN 500 MG  tablet Commonly known as:  GLUCOPHAGE Take 1 tablet (500 mg total) by mouth 2 (two) times daily with a meal.   ondansetron 4 MG disintegrating tablet Commonly known as:  ZOFRAN-ODT Take 1 tablet (4 mg total) by mouth every 8 (eight) hours as needed for nausea or vomiting.   oxyCODONE 5 MG immediate release tablet Commonly known as:  Oxy IR/ROXICODONE Take 5 mg by mouth every 4-6 hours PRN severe pain.   potassium chloride 10 MEQ tablet Commonly known as:  K-DUR,KLOR-CON Take 1 tablet (10 mEq total) by mouth daily. What changed:  how much to take  when to take this   rosuvastatin 10 MG tablet Commonly known as:  CRESTOR Take 1 tablet (10 mg total) by mouth daily. What changed:  when to take this   sacubitril-valsartan 24-26 MG Commonly known as:  ENTRESTO Take 1 tablet by mouth 2 (two) times daily.   spironolactone 25 MG tablet Commonly known as:  ALDACTONE Take 1 tablet (25 mg total) by mouth daily.   warfarin 7.5 MG tablet Commonly known as:  COUMADIN Take 1 tablet (7.5 mg total) by mouth daily at 6 PM.      The patient has been discharged on:   1.Beta Blocker:  Yes [   ]                              No   [ x  ]                              If No, reason: Bradycardia  2.Ace Inhibitor/ARB: Yes [ x  ]                                     No  [    ]                                     If No, reason:  3.Statin:   Yes [  x ]                  No  [   ]  If No, reason:  4.Ecasa:  Yes  [ x  ]                  No   [   ]                  If No, reason:  Follow Up Appointments: Follow-up Information    [facility] Follow up on 08/10/2016.   Specialty:  Cardiology Why:  at 2:30PM for wound check  Contact information: [address] [place] [phone]       Will Meredith Leeds, MD Follow up on 10/26/2016.   Specialty:  Cardiology Why:  at 11:45AM Contact information: [address] [place] [phone]        Rexene Alberts, MD Follow up on 08/28/2016.   Specialty:  Cardiothoracic Surgery Why:  PA/LAT CXR to be taken (at [facility] which is in the same building as Dr. Guy Sandifer office) on 08/28/2016 at 12:30 pm;Appointment time is at 1:00 pm Contact information: [address] [place] [address] [phone]        Loralie Champagne, MD. Go on 08/14/2016.   Specialty:  Cardiology Why:  at 1040 AM in the [facility] code 0004--Please bring all medications to appt Contact information: [address] [place] [address] [place] .   Why:  Please obtain a PT and INR and a BMET on Thursday 08/03/2016. Please fax results to Dr. Claris Gladden office [phone]          Signed: Lars Pinks MPA-C 08/01/2016, 10:28 AM

## 2016-07-27 NOTE — Progress Notes (Addendum)
[facility] DAILY ICU PROGRESS NOTE                   [address]            [place], [address]          [phone]   2 Days Post-Op Procedure(s) (LRB): BiV ICD Insertion CRT-D (N/A)  Total Length of Stay:  LOS: 16 days   Subjective: Patient passing flatus but no bowel movement yesterday. He wants to wait until his wife gets here to see about a laxative as had diarrhea with Dulcolax. He feels "so so" this am.  Objective: Vital signs in last 24 hours: Temp:  [97.9 F (36.6 C)-99 F (37.2 C)] 98.5 F (36.9 C) (10/26 0400) Pulse Rate:  [86-104] 90 (10/26 0700) Cardiac Rhythm: Ventricular paced (10/26 0400) Resp:  [8-25] 13 (10/26 0700) BP: (88-144)/(52-87) 103/66 (10/26 0700) SpO2:  [90 %-99 %] 93 % (10/26 0700) Weight:  [190 lb 7.6 oz (86.4 kg)] 190 lb 7.6 oz (86.4 kg) (10/26 0500)  Filed Weights   07/25/16 0500 07/26/16 0500 07/27/16 0500  Weight: 192 lb 3.9 oz (87.2 kg) 190 lb 14.7 oz (86.6 kg) 190 lb 7.6 oz (86.4 kg)    Weight change: -7.1 oz (-0.2 kg)   Hemodynamic parameters for last 24 hours: CVP:  [4 mmHg-6 mmHg] 4 mmHg  Intake/Output from previous day: 10/25 0701 - 10/26 0700 In: 546.6 [P.O.:540; I.V.:6.6] Out: [id number] [Urine:1675]  Intake/Output this shift: No intake/output data recorded.  Current Meds: Scheduled Meds: . amiodarone  200 mg Oral BID  . bisacodyl  10 mg Oral Daily   Or  . bisacodyl  10 mg Rectal Daily  . digoxin  0.125 mg Oral Daily  . feeding supplement (ENSURE ENLIVE)  237 mL Oral BID BM  . furosemide  40 mg Oral BID  . insulin aspart  0-15 Units Subcutaneous TID WC  . rosuvastatin  10 mg Oral QHS  . sacubitril-valsartan  1 tablet Oral BID  . spironolactone  25 mg Oral Daily  . warfarin  7.5 mg Oral q1800  . Warfarin - Physician Dosing Inpatient   Does not apply q1800   Continuous Infusions: . sodium chloride Stopped (07/19/16 1344)   PRN Meds:.acetaminophen, fentaNYL (SUBLIMAZE) injection, Influenza vac split  quadrivalent PF, metoprolol, ondansetron (ZOFRAN) IV, ondansetron (ZOFRAN) IV, sodium chloride flush, sodium chloride flush  General appearance: Alert, cooperative and no distress Neurologic: Intact Heart: Paced Lungs: Clear to auscultation bilaterally Abdomen: Soft, non-tender; bowel sounds normal; no masses,  no organomegaly Extremities: Bilateral LE edema Wound: Clean and dry  Lab Results: CBC:  Recent Labs  07/25/16 0433 07/27/16 0420  WBC 10.9* 11.2*  HGB 9.2* 10.7*  HCT 28.8* 33.1*  PLT 192 227   BMET:   Recent Labs  07/26/16 0600 07/27/16 0420  NA 135 133*  K 4.1 4.2  CL 97* 96*  CO2 29 27  GLUCOSE 148* 145*  BUN 18 23*  CREATININE 1.29* 1.26*  CALCIUM 8.6* 8.3*    CMET: Lab Results  Component Value Date   WBC 11.2 (H) 07/27/2016   HGB 10.7 (L) 07/27/2016   HCT 33.1 (L) 07/27/2016   PLT 227 07/27/2016   GLUCOSE 145 (H) 07/27/2016   CHOL 95 (L) 04/20/2016   TRIG 99 07/17/2016   HDL 28 (L) 04/20/2016   LDLCALC 45 04/20/2016   ALT 37 07/23/2016   AST 30 07/23/2016   NA 133 (L) 07/27/2016   K 4.2 07/27/2016  CL 96 (L) 07/27/2016   CREATININE 1.26 (H) 07/27/2016   BUN 23 (H) 07/27/2016   CO2 27 07/27/2016   TSH 3.729 06/21/2016   INR 1.59 07/27/2016   HGBA1C 6.0 (H) 07/07/2016    PT/INR:   Recent Labs  07/27/16 0420  LABPROT 19.2*  INR 1.59   Radiology: No results found.   Assessment/Plan: S/P Procedure(s) (LRB): BiV ICD Insertion CRT-D (N/A)   1. CV-AV paced. S/p CRT-D.On Milrinone drip, Amiodarone 200 mg bid, Digoxin 0.125 mg daily, Spirolactone 25 mg daily, and Coumadin. Co ox this am decreased to 62.7. INR decreased from 1.79 to 1.59. Will give 7.5 mg of Coumadin tonight. 2. Pulmonary- On 2 liters of oxygen via [place]. Encourage incentive spirometer. 3. Acute on chronic combined systolic and diastolic CHF- On Lasix 40 mg bid and Entresto po bid. 4. Creatinine decreased from 1.29 to 1.26. 5. ABL Anemia-H and H increased to 10.7 and  33.1 6. Remove EPW 7. DM-CBGs 160/171/174 . Pre op HGA1C 6. Will restart Metformin later today. 8. Transfer to circle (PCTU)  ZIMMERMAN,DONIELLE M PA-C 07/27/2016 8:13 AM

## 2016-07-27 NOTE — Progress Notes (Signed)
CARDIAC REHAB PHASE I   PRE:  Rate/Rhythm: 91 pacing    BP: sitting 113/68    SaO2: 96 RA  MODE:  Ambulation: 350 ft   POST:  Rate/Rhythm: 110 pacing    BP: sitting 147/85     SaO2: 99 RA  Pt and wife with questions on standing/mechanics. Discussed these. Pt able to stand fairly independent with verbal cues. Used RW and gait belt with ambulation. Has tendency to lean forward/not stand upright, probably due to weak core strength. Gave reminders during walk to stand tall and keep feet inside RW. He is reluctant to use RW but will need one for home, agreeable. Return to recliner. Pt could benefit from PT c/s for strengthening and more ambulation.  [place], ACSM 07/27/2016 2:47 PM

## 2016-07-27 NOTE — Progress Notes (Signed)
Patient ID: Christian Bowman, male   DOB: 05/27/1946, 70 y.o.   MRN: [id number]    Advanced Heart Failure Rounding Note   Subjective:    S/P CABG + bioprosthetic MVR on 10/10  Milrinone stopped 10/18 prior to DCCV (atrial flutter).  TEE-guided DCCV 10/18.  Underlying rhythm remained CHB, temporary wire changed to DDD mode by Dr Roxy Manns.    S/p CRT-D placement am of 07/25/16.  Co-ox 62.7% off milrinone. CVP 5-6. Creatinine 1.26  Feeling good this am.  Anxious to get home.   ICD stable per [employer] Interrogation.   INR 1.59  TEE (10/18): EF 20-25%, MV bioprosthesis looks ok, moderately decreased RV systolic function.   Objective:   Weight Range:  Vital Signs:   Temp:  [97.9 F (36.6 C)-99 F (37.2 C)] 98.6 F (37 C) (10/26 0827) Pulse Rate:  [86-97] 90 (10/26 0700) Resp:  [8-24] 13 (10/26 0700) BP: (88-144)/(56-87) 103/66 (10/26 0700) SpO2:  [93 %-99 %] 93 % (10/26 0700) Weight:  [190 lb 7.6 oz (86.4 kg)] 190 lb 7.6 oz (86.4 kg) (10/26 0500) Last BM Date: 07/22/16 (per pt)  Weight change: Filed Weights   07/25/16 0500 07/26/16 0500 07/27/16 0500  Weight: 192 lb 3.9 oz (87.2 kg) 190 lb 14.7 oz (86.6 kg) 190 lb 7.6 oz (86.4 kg)    Intake/Output:   Intake/Output Summary (Last 24 hours) at 07/27/16 1034 Last data filed at 07/27/16 0600  Gross per 24 hour  Intake              540 ml  Output             1675 ml  Net            -1135 ml    Physical Exam: CVP 5-6 General: NAD, sitting up in bed.  HEENT: Normal, except R neck cyst.  Neck: supple. JVP flat. Carotids 2+ bilat; no bruits. No thyromegaly or nodule noted.  Cor: PMI nondisplaced. Regular. Tachy. No M/G/R Lungs: Clear, normal effort Abdomen: soft, NT, ND, no HSM. No bruits or masses. +BS  Extremities: no cyanosis, clubbing, rash. Trace to 1+ edema to knees bilaterally, ted hose in place Neuro: alert & orientedx3, cranial nerves grossly intact. moves all 4 extremities w/o difficulty. Affect pleasant  Telemetry:  Reviewed, BiV pacing 90s  Labs: Basic Metabolic Panel:  Recent Labs Lab 07/23/16 0449 07/24/16 0223 07/25/16 0433 07/26/16 0600 07/27/16 0420  NA 135 135 133* 135 133*  K 4.4 4.1 4.0 4.1 4.2  CL 98* 96* 95* 97* 96*  CO2 30 30 30 29 27   GLUCOSE 153* 197* 153* 148* 145*  BUN 14 18 20 18  23*  CREATININE 1.28* 1.52* 1.38* 1.29* 1.26*  CALCIUM 8.3* 8.4* 8.2* 8.6* 8.3*    Liver Function Tests:  Recent Labs Lab 07/23/16 0449  AST 30  ALT 37  ALKPHOS 57  BILITOT 1.0  PROT 5.2*  ALBUMIN 2.3*   No results for input(s): LIPASE, AMYLASE in the last 168 hours. No results for input(s): AMMONIA in the last 168 hours.  CBC:  Recent Labs Lab 07/22/16 0425 07/23/16 0449 07/24/16 0223 07/25/16 0433 07/27/16 0420  WBC 14.9* 14.4* 14.4* 10.9* 11.2*  HGB 9.8* 9.7* 9.6* 9.2* 10.7*  HCT 30.8* 30.5* 29.7* 28.8* 33.1*  MCV 88.0 88.4 88.9 89.4 88.7  PLT 240 215 195 192 227    Cardiac Enzymes: No results for input(s): CKTOTAL, CKMB, CKMBINDEX, TROPONINI in the last 168 hours.  BNP: BNP (last 3 results)  No results for input(s): BNP in the last 8760 hours.  ProBNP (last 3 results) No results for input(s): PROBNP in the last 8760 hours.    Other results:  Imaging: Dg Chest 2 View  Result Date: 07/26/2016 CLINICAL DATA:  Cardiac device in place. EXAM: CHEST  2 VIEW COMPARISON:  Radiographs of July 24, 2016. FINDINGS: Stable cardiomegaly. Status post coronary artery bypass graft. Interval placement of left-sided pacemaker with leads in grossly good position. Right-sided PICC line is unchanged in position. No pneumothorax is noted. Minimal bilateral pleural effusions are noted. Status post cardiac valve repair. Bony thorax is unremarkable. IMPRESSION: Interval placement of left-sided pacemaker with leads in grossly good position. Aortic atherosclerosis. Minimal bilateral pleural effusions. Electronically Signed   By: Marijo Conception, M.D.   On: 07/26/2016 08:40      Medications:     Scheduled Medications: . amiodarone  200 mg Oral BID  . bisacodyl  10 mg Oral Daily   Or  . bisacodyl  10 mg Rectal Daily  . digoxin  0.125 mg Oral Daily  . feeding supplement (ENSURE ENLIVE)  237 mL Oral BID BM  . furosemide  40 mg Oral BID  . insulin aspart  0-15 Units Subcutaneous TID WC  . rosuvastatin  10 mg Oral QHS  . sacubitril-valsartan  1 tablet Oral BID  . spironolactone  25 mg Oral Daily  . warfarin  7.5 mg Oral q1800  . Warfarin - Physician Dosing Inpatient   Does not apply q1800    Infusions: . sodium chloride Stopped (07/19/16 1344)    PRN Medications: acetaminophen, fentaNYL (SUBLIMAZE) injection, Influenza vac split quadrivalent PF, metoprolol, ondansetron (ZOFRAN) IV, ondansetron (ZOFRAN) IV, sodium chloride flush, sodium chloride flush   Assessment/Plan/Discussion    1. CAD: s/p redo CABG with MVR. Continue statin.   2. Acute on chronic systolic CHF: Ischemic cardiomyopathy.  Pre-op EF 35-40%.  TEE 10/18 with EF 20-25%, moderate RV dysfunction.  Low co-ox suggestive of low output post-op, weaned off milrinone but co-ox down to 51% and felt nauseated, so milrinone restarted. Now re-weaned and off.  - Co-ox 62.7% today off milrinone. Now s/p [employer] CRT-D.  Coox stable. Follow in am.     - Continue dixogin 0.125 mg daily. Level 0.2 07/26/16..   - Volume status stable . - Continue po lasix 40 mg BID.  - Continue spironolactone - Continue Entresto 24/26 mg BID.  - Add low dose Coreg 3.125 mg bid today.   3. Bradycardia: Underlying CHB.  Currently sinus tachy with BiV pacing.  - Now s/p [employer] CRT-D   4. Atrial flutter: Post-op.  DCCV on 10/18,  NSR today with BiV pacing.   - Restarted on coumadin. Dosing per pharm.    - Continue amiodarone 200 mg bid for now 5. S/p bioprosthetic MVR: Stable on TEE 10/18.   OK for circle bed. Continues to improve from HF perspective.   Length of Stay: [place],  [place] 07/27/2016, 10:34 AM  Advanced Heart Failure Team Pager [phone] (M-F; 7a - 4p)  Please contact [facility] for night-coverage after hours (4p -7a ) and weekends on [email or web address]  Patient seen with PA, agree with the above note.  Doing well overall today.  Good co-ox, volume looks ok.  Think we can add low dose Coreg 3.125 mg bid.  Agree with moving to circle bed.   Loralie Champagne 07/27/2016 12:03 PM

## 2016-07-27 NOTE — Discharge Instructions (Addendum)
Supplemental Discharge Instructions for  Pacemaker/Defibrillator Patients  Activity No heavy lifting or vigorous activity with your left/right arm for 6 to 8 weeks.  Do not raise your left/right arm above your head for one week.  Gradually raise your affected arm as drawn below.           __     07/29/16                    07/30/16                    07/31/16              08/01/16  NO DRIVING   WOUND CARE - Keep the wound area clean and dry.  Do not get this area wet for one week. No showers for one week; you may shower on  08/01/16   . - The tape/steri-strips on your wound will fall off; do not pull them off.  No bandage is needed on the site.  DO  NOT apply any creams, oils, or ointments to the wound area. - If you notice any drainage or discharge from the wound, any swelling or bruising at the site, or you develop a fever > 101? F after you are discharged home, call the office at once.  Special Instructions - You are still able to use cellular telephones; use the ear opposite the side where you have your pacemaker/defibrillator.  Avoid carrying your cellular phone near your device. - When traveling through airports, show security personnel your identification card to avoid being screened in the metal detectors.  Ask the security personnel to use the hand wand. - Avoid arc welding equipment, MRI testing (magnetic resonance imaging), TENS units (transcutaneous nerve stimulators).  Call the office for questions about other devices. - Avoid electrical appliances that are in poor condition or are not properly grounded. - Microwave ovens are safe to be near or to operate.   Coronary Artery Bypass Grafting, Care After Refer to this sheet in the next few weeks. These instructions provide you with information on caring for yourself after your procedure. Your health care provider may also give you more specific instructions. Your treatment has been planned according to current medical  practices, but problems sometimes occur. Call your health care provider if you have any problems or questions after your procedure. WHAT TO EXPECT AFTER THE PROCEDURE Recovery from surgery will be different for everyone. Some people feel well after 3 or 4 weeks, while for others it takes longer. After your procedure, it is typical to have the following:  Nausea and a lack of appetite.   Constipation.  Weakness and fatigue.   Depression or irritability.   Pain or discomfort at your incision site. HOME CARE INSTRUCTIONS  Take medicines only as directed by your health care provider. Do not stop taking medicines or start any new medicines without first checking with your health care provider.  Take your pulse as directed by your health care provider.  Perform deep breathing as directed by your health care provider. If you were given a device called an incentive spirometer, use it to practice deep breathing several times a day. Support your chest with a pillow or your arms when you take deep breaths or cough.  Keep incision areas clean, dry, and protected. Remove or change any bandages (dressings) only as directed by your health care provider. You may have skin adhesive strips over the incision areas. Do  not take the strips off. They will fall off on their own.  Check incision areas daily for any swelling, redness, or drainage.  If incisions were made in your legs, do the following:  Avoid crossing your legs.   Avoid sitting for long periods of time. Change positions every 30 minutes.   Elevate your legs when you are sitting.  Wear compression stockings as directed by your health care provider. These stockings help keep blood clots from forming in your legs.  Take showers once your health care provider approves. Until then, only take sponge baths. Pat incisions dry. Do not rub incisions with a washcloth or towel. Do not take baths, swim, or use a hot tub until your health care  provider approves.  Eat foods that are high in fiber, such as raw fruits and vegetables, whole grains, beans, and nuts. Meats should be lean cut. Avoid canned, processed, and fried foods.  Drink enough fluid to keep your urine clear or pale yellow.  Weigh yourself every day. This helps identify if you are retaining fluid that may make your heart and lungs work harder.  Rest and limit activity as directed by your health care provider. You may be instructed to:  Stop any activity at once if you have chest pain, shortness of breath, irregular heartbeats, or dizziness. Get help right away if you have any of these symptoms.  Move around frequently for short periods or take short walks as directed by your health care provider. Increase your activities gradually. You may need physical therapy or cardiac rehabilitation to help strengthen your muscles and build your endurance.  Avoid lifting, pushing, or pulling anything heavier than 10 lb (4.5 kg) for at least 6 weeks after surgery.  Do not drive until your health care provider approves.  Ask your health care provider when you may return to work.  Ask your health care provider when you may resume sexual activity.  Keep all follow-up visits as directed by your health care provider. This is important. SEEK MEDICAL CARE IF:  You have swelling, redness, increasing pain, or drainage at the site of an incision.  You have a fever.  You have swelling in your ankles or legs.  You have pain in your legs.   You gain 2 or more pounds (0.9 kg) a day.  You are nauseous or vomit.  You have diarrhea. SEEK IMMEDIATE MEDICAL CARE IF:  You have chest pain that goes to your jaw or arms.  You have shortness of breath.   You have a fast or irregular heartbeat.   You notice a "clicking" in your breastbone (sternum) when you move.   You have numbness or weakness in your arms or legs.  You feel dizzy or light-headed.  MAKE SURE  YOU:  Understand these instructions.  Will watch your condition.  Will get help right away if you are not doing well or get worse.   This information is not intended to replace advice given to you by your health care provider. Make sure you discuss any questions you have with your health care provider.   Document Released: 04/07/2005 Document Revised: 10/09/2014 Document Reviewed: 02/25/2013 Elsevier Interactive Patient Education 2016 [employer]  Mitral Valve Replacement, Care After Refer to this sheet in the next few weeks. These instructions provide you with information on caring for yourself after your procedure. Your health care provider may also give you specific instructions. Your treatment has been planned according to current medical practices, but problems sometimes  occur. Call your health care provider if you have any problems or questions after your procedure.  HOME CARE INSTRUCTIONS   Take medicines only as directed by your health care provider.  Take your temperature every morning for the first 7 days after surgery. Write these down.  Weigh yourself every morning for at least 7 days after surgery. Write your weight down.  Wear elastic stockings during the day for at least 2 weeks after surgery or as directed by your health care provider. Use them longer if your ankles are swollen. The stockings help blood flow and help reduce swelling in the legs.  Take frequent naps or rest often throughout the day.  Avoid lifting more than 10 lb (4.5 kg) or pushing or pulling things with your arms for 6-8 weeks or as directed by your health care provider.  Avoid driving or airplane travel for 4-6 weeks after surgery or as directed. If you are riding in a car for an extended period, stop every 1-2 hours to stretch your legs.  Avoid crossing your legs.  Avoid climbing stairs and using the handrail to pull yourself up for the first 2-3 weeks after surgery.  Do not take baths for 2-4  weeks after surgery. Take showers once your health care provider approves. Pat incisions dry. Do not rub incisions with a washcloth or towel.  Return to work as directed by your health care provider.  Drink enough fluids to keep your urine clear or pale yellow.  Do not strain to have a bowel movement. Eat high-fiber foods if you become constipated. You may also take a medicine to help you have a bowel movement (laxative) as directed by your health care provider.  Resume sexual activity as directed by your health care provider. SEEK MEDICAL CARE IF:   You develop a skin rash.   Your weight is increasing each day over 2-3 days.  Your weight increases by 2 or more pounds (1 kg) in a single day.  You have a fever. SEEK IMMEDIATE MEDICAL CARE IF:   You develop chest pain that is not coming from your incision.  You develop shortness of breath or difficulty breathing.  You have drainage, redness, swelling, or pain at your incision site.  You have pus coming from your incision.  You develop light-headedness. MAKE SURE YOU:  Understand these directions.  Will watch your condition.  Will get help right away if you are not doing well or get worse.   This information is not intended to replace advice given to you by your health care provider. Make sure you discuss any questions you have with your health care provider.   Document Released: 04/07/2005 Document Revised: 10/09/2014 Document Reviewed: 02/18/2013 Elsevier Interactive Patient Education 2016 [place] on my medicine - Coumadin   (Warfarin)  This medication education was reviewed with me or my healthcare representative as part of my discharge preparation.  The pharmacist that spoke with me during my hospital stay was:  Carlean Jews, [facility]  Why was Coumadin prescribed for you? Coumadin was prescribed for you because you have a blood clot or a medical condition that can cause an increased risk of  forming blood clots. Blood clots can cause serious health problems by blocking the flow of blood to the heart, lung, or brain. Coumadin can prevent harmful blood clots from forming. As a reminder your indication for Coumadin is:   Blood Clot Prevention After Heart Valve Surgery  What test will check  on my response to Coumadin? While on Coumadin (warfarin) you will need to have an INR test regularly to ensure that your dose is keeping you in the desired range. The INR (international normalized ratio) number is calculated from the result of the laboratory test called prothrombin time (PT).  If an INR APPOINTMENT HAS NOT ALREADY BEEN MADE FOR YOU please schedule an appointment to have this lab work done by your health care provider within 7 days. Your INR goal is usually a number between:  2 to 3 or your provider may give you a more narrow range like 2-2.5.  Ask your health care provider during an office visit what your goal INR is.  What  do you need to  know  About  COUMADIN? Take Coumadin (warfarin) exactly as prescribed by your healthcare provider about the same time each day.  DO NOT stop taking without talking to the doctor who prescribed the medication.  Stopping without other blood clot prevention medication to take the place of Coumadin may increase your risk of developing a new clot or stroke.  Get refills before you run out.  What do you do if you miss a dose? If you miss a dose, take it as soon as you remember on the same day then continue your regularly scheduled regimen the next day.  Do not take two doses of Coumadin at the same time.  Important Safety Information A possible side effect of Coumadin (Warfarin) is an increased risk of bleeding. You should call your healthcare provider right away if you experience any of the following: ? Bleeding from an injury or your nose that does not stop. ? Unusual colored urine (red or dark brown) or unusual colored stools (red or black). ? Unusual  bruising for unknown reasons. ? A serious fall or if you hit your head (even if there is no bleeding).  Some foods or medicines interact with Coumadin (warfarin) and might alter your response to warfarin. To help avoid this: ? Eat a balanced diet, maintaining a consistent amount of Vitamin K. ? Notify your provider about major diet changes you plan to make. ? Avoid alcohol or limit your intake to 1 drink for women and 2 drinks for men per day. (1 drink is 5 oz. wine, 12 oz. beer, or 1.5 oz. liquor.)  Make sure that ANY health care provider who prescribes medication for you knows that you are taking Coumadin (warfarin).  Also make sure the healthcare provider who is monitoring your Coumadin knows when you have started a new medication including herbals and non-prescription products.  Coumadin (Warfarin)  Major Drug Interactions  Increased Warfarin Effect Decreased Warfarin Effect  Alcohol (large quantities) Antibiotics (esp. Septra/Bactrim, Flagyl, Cipro) Amiodarone (Cordarone) Aspirin (ASA) Cimetidine (Tagamet) Megestrol (Megace) NSAIDs (ibuprofen, naproxen, etc.) Piroxicam (Feldene) Propafenone (Rythmol SR) Propranolol (Inderal) Isoniazid (INH) Posaconazole (Noxafil) Barbiturates (Phenobarbital) Carbamazepine (Tegretol) Chlordiazepoxide (Librium) Cholestyramine (Questran) Griseofulvin Oral Contraceptives Rifampin Sucralfate (Carafate) Vitamin K   Coumadin (Warfarin) Major Herbal Interactions  Increased Warfarin Effect Decreased Warfarin Effect  Garlic Ginseng Ginkgo biloba Coenzyme Q10 Green tea St. Johns wort    Coumadin (Warfarin) FOOD Interactions  Eat a consistent number of servings per week of foods HIGH in Vitamin K (1 serving =  cup)  Collards (cooked, or boiled & drained) Kale (cooked, or boiled & drained) Mustard greens (cooked, or boiled & drained) Parsley *serving size only =  cup Spinach (cooked, or boiled & drained) Swiss chard (cooked, or boiled  & drained) Turnip  greens (cooked, or boiled & drained)  Eat a consistent number of servings per week of foods MEDIUM-HIGH in Vitamin K (1 serving = 1 cup)  Asparagus (cooked, or boiled & drained) Broccoli (cooked, boiled & drained, or raw & chopped) Brussel sprouts (cooked, or boiled & drained) *serving size only =  cup Lettuce, raw (green leaf, endive, romaine) Spinach, raw Turnip greens, raw & chopped   These websites have more information on Coumadin (warfarin):  FailFactory.se; VeganReport.com.au;

## 2016-07-27 NOTE — Progress Notes (Signed)
07/27/2016 1230 Received pt to 2W24 from 2S.  Pt is A&O, no c/o voiced.  Tele monitor applied and CCMD notified.  Oriented to room, call light and bed.  Call bell in reach, wife at bedside. [place]

## 2016-07-27 NOTE — Care Management Important Message (Signed)
Important Message  Patient Details  Name: Christian Bowman MRN: [id number] Date of Birth: 1946/04/13   Medicare Important Message Given:  Yes    [name] 07/27/2016, 9:20 AM

## 2016-07-28 ENCOUNTER — Inpatient Hospital Stay (HOSPITAL_COMMUNITY): Payer: Medicare Other

## 2016-07-28 DIAGNOSIS — Z951 Presence of aortocoronary bypass graft: Secondary | ICD-10-CM

## 2016-07-28 LAB — PROTIME-INR
INR: 1.34
Prothrombin Time: 16.7 seconds — ABNORMAL HIGH (ref 11.4–15.2)

## 2016-07-28 LAB — BASIC METABOLIC PANEL
Anion gap: 9 (ref 5–15)
BUN: 23 mg/dL — AB (ref 6–20)
CALCIUM: 8.6 mg/dL — AB (ref 8.9–10.3)
CHLORIDE: 96 mmol/L — AB (ref 101–111)
CO2: 30 mmol/L (ref 22–32)
CREATININE: 1.37 mg/dL — AB (ref 0.61–1.24)
GFR calc Af Amer: 60 mL/min — ABNORMAL LOW (ref >60)
GFR calc non Af Amer: 51 mL/min — ABNORMAL LOW (ref >60)
Glucose, Bld: 161 mg/dL — ABNORMAL HIGH (ref 65–99)
Potassium: 4.2 mmol/L (ref 3.5–5.1)
SODIUM: 135 mmol/L (ref 135–145)

## 2016-07-28 LAB — GLUCOSE, CAPILLARY
GLUCOSE-CAPILLARY: 171 mg/dL — AB (ref 65–99)
GLUCOSE-CAPILLARY: 245 mg/dL — AB (ref 65–99)
GLUCOSE-CAPILLARY: 149 mg/dL — AB (ref 65–99)
Glucose-Capillary: 139 mg/dL — ABNORMAL HIGH (ref 65–99)

## 2016-07-28 LAB — HEPARIN LEVEL (UNFRACTIONATED)

## 2016-07-28 LAB — CBC
HCT: 34.6 % — ABNORMAL LOW (ref 39.0–52.0)
Hemoglobin: 11 g/dL — ABNORMAL LOW (ref 13.0–17.0)
MCH: 28.4 pg (ref 26.0–34.0)
MCHC: 31.8 g/dL (ref 30.0–36.0)
MCV: 89.4 fL (ref 78.0–100.0)
PLATELETS: 225 10*3/uL (ref 150–400)
RBC: 3.87 MIL/uL — ABNORMAL LOW (ref 4.22–5.81)
RDW: 15.4 % (ref 11.5–15.5)
WBC: 12.7 10*3/uL — AB (ref 4.0–10.5)

## 2016-07-28 LAB — COOXEMETRY PANEL
CARBOXYHEMOGLOBIN: 1.5 % (ref 0.5–1.5)
METHEMOGLOBIN: 1 % (ref 0.0–1.5)
O2 SAT: 50.3 %
TOTAL HEMOGLOBIN: 11 g/dL — AB (ref 12.0–16.0)

## 2016-07-28 MED ORDER — HEPARIN (PORCINE) IN NACL 100-0.45 UNIT/ML-% IJ SOLN
2400.0000 [IU]/h | INTRAMUSCULAR | Status: DC
  Administered 2016-07-28: 1850 [IU]/h via INTRAVENOUS
  Administered 2016-07-29: 2400 [IU]/h via INTRAVENOUS
  Filled 2016-07-28 (×4): qty 250

## 2016-07-28 MED ORDER — AMIODARONE HCL 200 MG PO TABS
200.0000 mg | ORAL_TABLET | Freq: Every day | ORAL | Status: DC
  Administered 2016-07-28 – 2016-08-01 (×5): 200 mg via ORAL
  Filled 2016-07-28 (×4): qty 1

## 2016-07-28 MED ORDER — ALTEPLASE 2 MG IJ SOLR
2.0000 mg | Freq: Once | INTRAMUSCULAR | Status: AC
  Administered 2016-07-28: 2 mg
  Filled 2016-07-28 (×2): qty 2

## 2016-07-28 MED ORDER — FUROSEMIDE 40 MG PO TABS
40.0000 mg | ORAL_TABLET | Freq: Every day | ORAL | Status: DC
  Administered 2016-07-29 – 2016-07-30 (×2): 40 mg via ORAL
  Filled 2016-07-28 (×2): qty 1

## 2016-07-28 MED ORDER — ALTEPLASE 2 MG IJ SOLR
2.0000 mg | Freq: Once | INTRAMUSCULAR | Status: DC
Start: 2016-07-28 — End: 2016-08-01
  Filled 2016-07-28 (×3): qty 2

## 2016-07-28 NOTE — Progress Notes (Signed)
CARDIAC REHAB PHASE I   PRE:  Rate/Rhythm: 84 pacing    BP: sitting 98/58    SaO2: 96 RA  MODE:  Ambulation: 550 ft   POST:  Rate/Rhythm: 103 pacing    BP: sitting 136/74     SaO2: 100 RA  Pt stronger today, less reminders to stand tall. Did not get feet outside of RW. Increased distance, no major c/o. He can walk with his wife now.  Ed completed with pt and wife including HF management. They are very versed on low sodium diet. Will send referral to [facility]. Gave [facility] video to watch. [id number]  [place], ACSM 07/28/2016 12:14 PM

## 2016-07-28 NOTE — Progress Notes (Addendum)
ANTICOAGULATION CONSULT NOTE - Follow Up Consult  Pharmacy Consult for Heparin Indication: tissue MVR / aflutter  Allergies  Allergen Reactions  . Xanax [Alprazolam] Other (See Comments)    Pt feels very weak, tired and feels paralyzed      Patient Measurements: Height: 6' (182.9 cm) Weight: 186 lb 3.2 oz (84.5 kg) IBW/kg (Calculated) : 77.6   Vital Signs: Temp: 97.7 F (36.5 C) (10/27 0522) Temp Source: Axillary (10/27 0522) BP: 96/58 (10/27 0828) Pulse Rate: 83 (10/27 1103)  Labs:  Recent Labs  07/26/16 0600 07/27/16 0420 07/28/16 0510  HGB  --  10.7* 11.0*  HCT  --  33.1* 34.6*  PLT  --  227 225  LABPROT 21.0* 19.2* 16.7*  INR 1.79 1.59 1.34  CREATININE 1.29* 1.26* 1.37*    Estimated Creatinine Clearance: 56.6 mL/min (by C-G formula based on SCr of 1.37 mg/dL (H)).  Assessment: 70yom s/p redo CABG and tissue MVR on 10/10 started on coumadin with heparin bridge post op for new aflutter. Underwent TEE/DCCV on 10/18. INR therapeutic on 10/24 so heparin d/c'ed and he had CRT-D placed for CHB. INR has since trended down to 1.36 today. Heparin bridge to resume. He was previously therapeutic on 2050 units/hr, however, his weight was almost 30lbs more so will start at a lower rate. CBC stable.    Goal of Therapy:  Heparin level 03.-0.7 INR 2-3 Monitor platelets by anticoagulation protocol: Yes   Plan:  1) Begin heparin 1850 units/hr with no bolus 2) Coumadin 7.5mg  per MD 3) Check 6 hour heparin level 4) Daily heparin level, INR, CBC  Deboraha Sprang 07/28/2016,12:36 PM

## 2016-07-28 NOTE — Progress Notes (Addendum)
      [address]       [place] [address]             [phone]        3 Days Post-Op Procedure(s) (LRB): BiV ICD Insertion CRT-D (N/A)  Subjective: Patient eating breakfast this am. Had a bowel movement yesterday.  Objective: Vital signs in last 24 hours: Temp:  [97.7 F (36.5 C)-98.6 F (37 C)] 97.7 F (36.5 C) (10/27 0522) Pulse Rate:  [83-92] 92 (10/27 0522) Cardiac Rhythm: Normal sinus rhythm;Ventricular paced;Bundle branch block (10/26 1900) Resp:  [18-21] 18 (10/27 0522) BP: (112-147)/(66-86) 124/73 (10/27 0522) SpO2:  [92 %-98 %] 98 % (10/27 0522) Weight:  [186 lb 3.2 oz (84.5 kg)] 186 lb 3.2 oz (84.5 kg) (10/27 0522)  Pre op weight 81 kg Current Weight  07/28/16 186 lb 3.2 oz (84.5 kg)    Hemodynamic parameters for last 24 hours: CVP:  [22 mmHg] 22 mmHg  Intake/Output from previous day: 10/26 0701 - 10/27 0700 In: -  Out: 1875 [Urine:1875]   Physical Exam:  Cardiovascular: Paced Pulmonary: Clear to auscultation bilaterally Abdomen: Soft, non tender, bowel sounds present. Extremities: Bilateral lower extremity edema and is decreasing. Wounds: Clean and dry.  No erythema or signs of infection.  Lab Results: CBC: Recent Labs  07/27/16 0420 07/28/16 0510  WBC 11.2* 12.7*  HGB 10.7* 11.0*  HCT 33.1* 34.6*  PLT 227 225   BMET:  Recent Labs  07/27/16 0420 07/28/16 0510  NA 133* 135  K 4.2 4.2  CL 96* 96*  CO2 27 30  GLUCOSE 145* 161*  BUN 23* 23*  CREATININE 1.26* 1.37*  CALCIUM 8.3* 8.6*    PT/INR:  Lab Results  Component Value Date   INR 1.34 07/28/2016   INR 1.59 07/27/2016   INR 1.79 07/26/2016   ABG:  INR: Will add last result for INR, ABG once components are confirmed Will add last 4 CBG results once components are confirmed  Assessment/Plan: 1. CV-AV paced. S/p CRT-D.On Amiodarone 200 mg bid, Digoxin 0.125 mg daily, Coreg 3.125 mg bid, Spirolactone 25 mg daily, and Coumadin. Co ox this am decreased  to 50.3. INR decreased from 1.59 to 1.34. Will give 7.5 mg of Coumadin tonight as latest INR from 5 mg.  2. Pulmonary- On room air. CXR appears stable. Encourage incentive spirometer. 3. Acute on chronic combined systolic and diastolic CHF- On Lasix 40 mg bid and Entresto po bid. Heart failure following. 4. Creatinine slightly increased from 1.26 to 1.37. 5. ABL Anemia-H and H increased to 11 and 34.6 6. DM-CBGs 217/124/139 .  Continue Metformin 500 mg bid. Pre op HGA1C 6.    ZIMMERMAN,DONIELLE MPA-C 07/28/2016,7:57 AM  I have seen and examined the patient and agree with the assessment and plan as outlined.  Plan per advanced heart failure team.  Rexene Alberts, MD 07/28/2016 5:55 PM

## 2016-07-28 NOTE — Progress Notes (Signed)
Patient ID: Christian Bowman, male   DOB: [birth year], 70 y.o.   MRN: [id number]    Advanced Heart Failure Rounding Note   Subjective:    S/P CABG + bioprosthetic MVR on 10/10  Atrial flutter => TEE-guided DCCV 10/18.  Underlying rhythm remained CHB.  S/p CRT-D placement 07/25/16.  Co-ox 62.7% => 50% after starting Coreg. Creatinine 1.26 => 1.37.   Feeling good this am.  No significant dyspnea with walks.  Anxious to get home.    INR 1.34  TEE (10/18): EF 20-25%, MV bioprosthesis looks ok, moderately decreased RV systolic function.   Objective:   Weight Range:  Vital Signs:   Temp:  [97.7 F (36.5 C)-98.3 F (36.8 C)] 97.7 F (36.5 C) (10/27 0522) Pulse Rate:  [83-92] 92 (10/27 0828) Resp:  [18] 18 (10/27 0522) BP: (96-147)/(58-86) 96/58 (10/27 0828) SpO2:  [96 %-98 %] 98 % (10/27 0522) Weight:  [186 lb 3.2 oz (84.5 kg)] 186 lb 3.2 oz (84.5 kg) (10/27 0522) Last BM Date: 07/27/16  Weight change: Filed Weights   07/26/16 0500 07/27/16 0500 07/28/16 0522  Weight: 190 lb 14.7 oz (86.6 kg) 190 lb 7.6 oz (86.4 kg) 186 lb 3.2 oz (84.5 kg)    Intake/Output:   Intake/Output Summary (Last 24 hours) at 07/28/16 1003 Last data filed at 07/28/16 0523  Gross per 24 hour  Intake                0 ml  Output             1650 ml  Net            -1650 ml    Physical Exam: General: NAD, sitting up in bed.  HEENT: Normal, except R neck cyst.  Neck: supple. JVP flat. Carotids 2+ bilat; no bruits. No thyromegaly or nodule noted.  Cor: PMI nondisplaced. Regular. Tachy. No M/G/R Lungs: Clear, normal effort Abdomen: soft, NT, ND, no HSM. No bruits or masses. +BS  Extremities: no cyanosis, clubbing, rash.  1+ edema to knees bilaterally, ted hose in place Neuro: alert & orientedx3, cranial nerves grossly intact. moves all 4 extremities w/o difficulty. Affect pleasant  Telemetry: Reviewed, BiV pacing 80s  Labs: Basic Metabolic Panel:  Recent Labs Lab 07/24/16 0223 07/25/16 0433  07/26/16 0600 07/27/16 0420 07/28/16 0510  NA 135 133* 135 133* 135  K 4.1 4.0 4.1 4.2 4.2  CL 96* 95* 97* 96* 96*  CO2 30 30 29 27 30   GLUCOSE 197* 153* 148* 145* 161*  BUN 18 20 18  23* 23*  CREATININE 1.52* 1.38* 1.29* 1.26* 1.37*  CALCIUM 8.4* 8.2* 8.6* 8.3* 8.6*    Liver Function Tests:  Recent Labs Lab 07/23/16 0449  AST 30  ALT 37  ALKPHOS 57  BILITOT 1.0  PROT 5.2*  ALBUMIN 2.3*   No results for input(s): LIPASE, AMYLASE in the last 168 hours. No results for input(s): AMMONIA in the last 168 hours.  CBC:  Recent Labs Lab 07/23/16 0449 07/24/16 0223 07/25/16 0433 07/27/16 0420 07/28/16 0510  WBC 14.4* 14.4* 10.9* 11.2* 12.7*  HGB 9.7* 9.6* 9.2* 10.7* 11.0*  HCT 30.5* 29.7* 28.8* 33.1* 34.6*  MCV 88.4 88.9 89.4 88.7 89.4  PLT 215 195 192 227 225    Cardiac Enzymes: No results for input(s): CKTOTAL, CKMB, CKMBINDEX, TROPONINI in the last 168 hours.  BNP: BNP (last 3 results) No results for input(s): BNP in the last 8760 hours.  ProBNP (last 3 results) No results for  input(s): PROBNP in the last 8760 hours.    Other results:  Imaging: Dg Chest 2 View  Result Date: 07/28/2016 CLINICAL DATA:  Pneumothorax EXAM: CHEST  2 VIEW COMPARISON:  07/26/2016 FINDINGS: Left pacer remains in place, unchanged. No pneumothorax. Cardiomegaly. Small bilateral pleural effusions. No confluent opacities or effusions. IMPRESSION: No pneumothorax. Small bilateral pleural effusions. Electronically Signed   By: Rolm Baptise M.D.   On: 07/28/2016 08:44     Medications:     Scheduled Medications: . alteplase  2 mg Intracatheter Once  . amiodarone  200 mg Oral Daily  . bisacodyl  10 mg Oral Daily   Or  . bisacodyl  10 mg Rectal Daily  . digoxin  0.125 mg Oral Daily  . feeding supplement (ENSURE ENLIVE)  237 mL Oral BID BM  . [START ON 07/29/2016] furosemide  40 mg Oral Daily  . insulin aspart  0-15 Units Subcutaneous TID WC  . metFORMIN  500 mg Oral BID WC  .  pantoprazole  40 mg Oral QAC breakfast  . rosuvastatin  10 mg Oral QHS  . sacubitril-valsartan  1 tablet Oral BID  . sodium chloride flush  3 mL Intravenous Q12H  . spironolactone  25 mg Oral Daily  . warfarin  7.5 mg Oral q1800  . Warfarin - Physician Dosing Inpatient   Does not apply q1800    Infusions: . sodium chloride Stopped (07/19/16 1344)    PRN Medications: sodium chloride, acetaminophen, Influenza vac split quadrivalent PF, metoprolol, ondansetron (ZOFRAN) IV, oxyCODONE, sodium chloride flush, sodium chloride flush, traMADol   Assessment/Plan/Discussion    1. CAD: s/p redo CABG with MVR.  - Continue statin.   2. Acute on chronic systolic CHF: Ischemic cardiomyopathy.  Pre-op EF 35-40%.  TEE 10/18 with EF 20-25%, moderate RV dysfunction.  Medtronic CRT-D placed with CHB.  Low co-ox suggestive of low output post-op, weaned off milrinone but co-ox dropped to 50% today after starting Coreg.  - Stop Coreg with low co-ox, repeat co-ox in am.  - Continue dixogin 0.125 mg daily. Level 0.2 07/26/16. - Can decrease Lasix to 40 mg daily.   - Continue spironolactone - Continue Entresto 24/26 mg BID.   3. Bradycardia: Underlying CHB.  Now s/p Medtronic CRT-D.  Currently sinus rhythm with BiV pacing.  4. Atrial flutter: Post-op.  DCCV on 10/18,  NSR today with BiV pacing.   - Restarted on coumadin. Dosing per pharm.    - Continue amiodarone, can decrease to 200 mg daily today.  5. S/p bioprosthetic MVR: Stable on TEE 10/18.    Length of Stay: [address] 07/28/2016, 10:03 AM  Advanced Heart Failure Team Pager [phone] (M-F; 7a - 4p)  Please contact [facility] for night-coverage after hours (4p -7a ) and weekends on [email or web address]

## 2016-07-28 NOTE — Progress Notes (Signed)
ANTICOAGULATION CONSULT NOTE - Follow Up Consult  Pharmacy Consult for Heparin Indication: tissue MVR / aflutter  Allergies  Allergen Reactions  . Xanax [Alprazolam] Other (See Comments)    Pt feels very weak, tired and feels paralyzed      Patient Measurements: Height: 6' (182.9 cm) Weight: 186 lb 3.2 oz (84.5 kg) IBW/kg (Calculated) : 77.6   Vital Signs: Temp: 98.8 F (37.1 C) (10/27 1957) Temp Source: Oral (10/27 1957) BP: 111/62 (10/27 1957) Pulse Rate: 85 (10/27 1957)  Labs:  Recent Labs  07/26/16 0600 07/27/16 0420 07/28/16 0510 07/28/16 2030  HGB  --  10.7* 11.0*  --   HCT  --  33.1* 34.6*  --   PLT  --  227 225  --   LABPROT 21.0* 19.2* 16.7*  --   INR 1.79 1.59 1.34  --   HEPARINUNFRC  --   --   --  <0.10*  CREATININE 1.29* 1.26* 1.37*  --     Estimated Creatinine Clearance: 56.6 mL/min (by C-G formula based on SCr of 1.37 mg/dL (H)).  Assessment: 70yom s/p redo CABG and tissue MVR on 10/10 started on coumadin with heparin bridge post op for new aflutter. Underwent TEE/DCCV on 10/18. INR therapeutic on 10/24 so heparin d/c'ed and he had CRT-D placed for CHB. INR has since trended down to 1.36 today. Heparin bridge to resume. He was previously therapeutic on 2050 units/hr, however, his weight was almost 30lbs more so will start at a lower rate. CBC stable.   Heparin level is undetectable at <0.1 on 1850 units/hr. No bleeding noted. No problems with infusion or bleeding per RN.  Goal of Therapy:  Heparin level 0.3-0.7 units/ml Monitor platelets by anticoagulation protocol: Yes   Plan:  1) Increase heparin to 2100 units/hr with no bolus 2) Check 6 hour heparin level 3) Daily heparin level, INR, CBC 4) Monitor for s/sx of bleeding   Renold Genta, PharmD, BCPS Clinical Pharmacist Phone for tonight - [facility] - [phone] 07/28/2016 9:31 PM

## 2016-07-29 DIAGNOSIS — Z953 Presence of xenogenic heart valve: Secondary | ICD-10-CM

## 2016-07-29 LAB — GLUCOSE, CAPILLARY
GLUCOSE-CAPILLARY: 130 mg/dL — AB (ref 65–99)
GLUCOSE-CAPILLARY: 147 mg/dL — AB (ref 65–99)
GLUCOSE-CAPILLARY: 157 mg/dL — AB (ref 65–99)
GLUCOSE-CAPILLARY: 116 mg/dL — AB (ref 65–99)

## 2016-07-29 LAB — BASIC METABOLIC PANEL
ANION GAP: 11 (ref 5–15)
BUN: 29 mg/dL — ABNORMAL HIGH (ref 6–20)
CO2: 28 mmol/L (ref 22–32)
Calcium: 8.7 mg/dL — ABNORMAL LOW (ref 8.9–10.3)
Chloride: 96 mmol/L — ABNORMAL LOW (ref 101–111)
Creatinine, Ser: 1.37 mg/dL — ABNORMAL HIGH (ref 0.61–1.24)
GFR, EST AFRICAN AMERICAN: 60 mL/min — AB (ref >60)
GFR, EST NON AFRICAN AMERICAN: 51 mL/min — AB (ref >60)
Glucose, Bld: 135 mg/dL — ABNORMAL HIGH (ref 65–99)
POTASSIUM: 4.6 mmol/L (ref 3.5–5.1)
SODIUM: 135 mmol/L (ref 135–145)

## 2016-07-29 LAB — CBC
HEMATOCRIT: 34.3 % — AB (ref 39.0–52.0)
HEMOGLOBIN: 10.8 g/dL — AB (ref 13.0–17.0)
MCH: 28.2 pg (ref 26.0–34.0)
MCHC: 31.5 g/dL (ref 30.0–36.0)
MCV: 89.6 fL (ref 78.0–100.0)
Platelets: 270 10*3/uL (ref 150–400)
RBC: 3.83 MIL/uL — AB (ref 4.22–5.81)
RDW: 15.6 % — ABNORMAL HIGH (ref 11.5–15.5)
WBC: 11.1 10*3/uL — AB (ref 4.0–10.5)

## 2016-07-29 LAB — COOXEMETRY PANEL
Carboxyhemoglobin: 1.4 % (ref 0.5–1.5)
METHEMOGLOBIN: 0.8 % (ref 0.0–1.5)
O2 Saturation: 55.3 %
Total hemoglobin: 11 g/dL — ABNORMAL LOW (ref 12.0–16.0)

## 2016-07-29 LAB — HEPARIN LEVEL (UNFRACTIONATED)
Heparin Unfractionated: <0.1 IU/mL — ABNORMAL LOW (ref 0.30–0.70)
Heparin Unfractionated: <0.1 IU/mL — ABNORMAL LOW (ref 0.30–0.70)

## 2016-07-29 LAB — PROTIME-INR
INR: 1.51
Prothrombin Time: 18.4 seconds — ABNORMAL HIGH (ref 11.4–15.2)

## 2016-07-29 NOTE — Progress Notes (Addendum)
[id number]  Pt denied walk with CRPI at this time. Pt did walk with nursing staff this morning. Expressed to pt the importance of walking. Pt voiced understanding! Encouraged pt to walk at least one more time before the end of the day.    [id number]  CARDIAC REHAB PHASE I   PRE:  Rate/Rhythm: 87 pacing  BP:   Sitting: 112/57     SaO2: 100% RA  MODE:  Ambulation: 500 ft   POST:  Rate/Rhythm: 102 pacing  BP:   Sitting: 131/64     SaO2: 100%RA 1515-1552  Pt ambulated 500 ft with one person A, walker and IV pole. Pt maintained a somewhat steady gait and was eager to get up and moving this go round. Pt denied SOB, CP and dizziness while walking. Returned pt to recliner with LE elevated, VSS and call bell/telephone in reach. Practiced IS. Encouraged pt to walk one more time before bed. Pt voiced understanding.    Jamarrius Salay D Kashlynn Kundert,MS,ACSM-RCEP 07/29/2016 3:49 PM      Lazette Estala Iline Oven

## 2016-07-29 NOTE — Progress Notes (Signed)
ANTICOAGULATION CONSULT NOTE - Follow Up Consult  Pharmacy Consult for Heparin  Indication: Atrial flutter, tissue MVR  Allergies  Allergen Reactions  . Xanax [Alprazolam] Other (See Comments)    Pt feels very weak, tired and feels paralyzed     Patient Measurements: Height: 6' (182.9 cm) Weight: 188 lb 3.2 oz (85.4 kg) IBW/kg (Calculated) : 77.6  Vital Signs: Temp: 98.4 F (36.9 C) (10/28 0414) Temp Source: Oral (10/28 0414) BP: 111/64 (10/28 0414) Pulse Rate: 90 (10/28 0414)  Labs:  Recent Labs  07/27/16 0420 07/28/16 0510 07/28/16 2030 07/29/16 0449 07/29/16 0500  HGB 10.7* 11.0*  --   --  10.8*  HCT 33.1* 34.6*  --   --  34.3*  PLT 227 225  --   --  270  LABPROT 19.2* 16.7*  --   --  18.4*  INR 1.59 1.34  --   --  1.51  HEPARINUNFRC  --   --  <0.10* <0.10*  --   CREATININE 1.26* 1.37*  --   --  1.37*    Estimated Creatinine Clearance: 56.6 mL/min (by C-G formula based on SCr of 1.37 mg/dL (H)).   Assessment: Heparin bridge while waiting for therapeutic INR, Hgb stable, INR still low at 1.51  Goal of Therapy:  Heparin level 0.3-0.7 units/ml Monitor platelets by anticoagulation protocol: Yes   Plan:  -Inc heparin to 2400 units/hr -1400 HL  Ezri Landers 07/29/2016,6:22 AM

## 2016-07-29 NOTE — Progress Notes (Signed)
ANTICOAGULATION CONSULT NOTE - Follow Up Consult  Pharmacy Consult for Heparin Indication: tissue MVR / aflutter  Allergies  Allergen Reactions  . Xanax [Alprazolam] Other (See Comments)    Pt feels very weak, tired and feels paralyzed      Patient Measurements: Height: 6' (182.9 cm) Weight: 188 lb 3.2 oz (85.4 kg) IBW/kg (Calculated) : 77.6   Vital Signs: Temp: 98.4 F (36.9 C) (10/28 0414) Temp Source: Oral (10/28 0414) BP: 121/70 (10/28 1033) Pulse Rate: 96 (10/28 1033)  Labs:  Recent Labs  07/27/16 0420 07/28/16 0510 07/28/16 2030 07/29/16 0449 07/29/16 0500  HGB 10.7* 11.0*  --   --  10.8*  HCT 33.1* 34.6*  --   --  34.3*  PLT 227 225  --   --  270  LABPROT 19.2* 16.7*  --   --  18.4*  INR 1.59 1.34  --   --  1.51  HEPARINUNFRC  --   --  <0.10* <0.10*  --   CREATININE 1.26* 1.37*  --   --  1.37*    Estimated Creatinine Clearance: 56.6 mL/min (by C-G formula based on SCr of 1.37 mg/dL (H)).  Assessment: 70yom s/p redo CABG and tissue MVR on 10/10, started on coumadin with heparin bridge post-op for new aflutter. Underwent TEE/DCCV on 10/18. INR therapeutic on 10/24 so heparin d/c'd and had CRT-D placed for CHB. Heparin bridge to resumed 10/27 with dip in INR. INR up to 1.51 this AM. CBC stable.   Heparin level remains undectectable after multiple rate increases to 2400 units/h. Per RN, no issues with drip or bleeding.  Goal of Therapy:  Heparin level 0.3-0.7 INR 2-3 Monitor platelets by anticoagulation protocol: Yes   Plan:  Increase heparin to 2750 units/hr Coumadin 7.5mg  daily per MD 6h heparin level Daily INR/heparin level/CBC Monitor for s/sx bleeding   Elicia Lamp, PharmD, BCPS Clinical Pharmacist 07/29/2016 11:15 AM

## 2016-07-29 NOTE — Progress Notes (Addendum)
      [address]       [place] [address]             [phone]        4 Days Post-Op Procedure(s) (LRB): BiV ICD Insertion CRT-D (N/A)  Subjective: Patient   Objective: Vital signs in last 24 hours: Temp:  [98.4 F (36.9 C)-98.8 F (37.1 C)] 98.4 F (36.9 C) (10/28 0414) Pulse Rate:  [83-90] 90 (10/28 0414) Cardiac Rhythm: Ventricular paced (10/28 0700) Resp:  [20-22] 22 (10/28 0414) BP: (111)/(62-64) 111/64 (10/28 0414) SpO2:  [97 %-98 %] 98 % (10/28 0414) Weight:  [188 lb 3.2 oz (85.4 kg)] 188 lb 3.2 oz (85.4 kg) (10/28 0414)  Pre op weight 81 kg Current Weight  07/29/16 188 lb 3.2 oz (85.4 kg)    Intake/Output from previous day: 10/27 0701 - 10/28 0700 In: 477 [P.O.:477] Out: 275 [Urine:275]   Physical Exam:  Cardiovascular: Paced, regular Pulmonary: Clear to auscultation bilaterally Abdomen: Soft, non tender, bowel sounds present. Extremities: Bilateral lower extremity edema but is decreasing. [name] hose in place. Wounds: Clean and dry.  No erythema or signs of infection.  Lab Results: CBC:  Recent Labs  07/28/16 0510 07/29/16 0500  WBC 12.7* 11.1*  HGB 11.0* 10.8*  HCT 34.6* 34.3*  PLT 225 270   BMET:   Recent Labs  07/28/16 0510 07/29/16 0500  NA 135 135  K 4.2 4.6  CL 96* 96*  CO2 30 28  GLUCOSE 161* 135*  BUN 23* 29*  CREATININE 1.37* 1.37*  CALCIUM 8.6* 8.7*    PT/INR:  Lab Results  Component Value Date   INR 1.51 07/29/2016   INR 1.34 07/28/2016   INR 1.59 07/27/2016   ABG:  INR: Will add last result for INR, ABG once components are confirmed Will add last 4 CBG results once components are confirmed  Assessment/Plan: 1. CV-AV paced. S/p CRT-D.On Amiodarone 200 mg daily, Digoxin 0.125 mg daily, Spirolactone 25 mg daily, and Coumadin. Co ox this am increased to 55.3. INR increased from 1.34 to 1.51. Will give 7.5 mg of Coumadin again tonight  2. Pulmonary- On room air. CXR appears stable. Encourage  incentive spirometer. 3. Acute on chronic combined systolic and diastolic CHF- On Lasix 40 mg bid and Entresto po bid. Heart failure following. 4. Creatinine slightly increased from 1.26 to 1.37. 5. ABL Anemia-H and H increased to 10.8 and 34.3 6. DM-CBGs 171/245/149 .  Continue Metformin 500 mg bid. Pre op HGA1C 6. 7. Await heart failure evaluation this am   ZIMMERMAN,DONIELLE MPA-C 07/29/2016,8:28 AM  I have seen and examined Christian Bowman and agree with the above assessment  and plan.  Grace Isaac MD Beeper [phone] Office [phone] 07/29/2016 10:58 AM

## 2016-07-29 NOTE — Progress Notes (Signed)
Patient ID: Christian Bowman, male   DOB: [birth year], 70 y.o.   MRN: [id number]  Stable today, co-ox up to 55%.  No med changes, continue current regimen.  I will see again tomorrow.   Loralie Champagne 07/29/2016

## 2016-07-29 NOTE — Progress Notes (Addendum)
[facility] for Heparin  Indication: Atrial flutter  Allergies  Allergen Reactions  . Xanax [Alprazolam] Other (See Comments)    Pt feels very weak, tired and feels paralyzed     Patient Measurements: Height: 6' (182.9 cm) Weight: 188 lb 3.2 oz (85.4 kg) IBW/kg (Calculated) : 77.6  Vital Signs: Temp: 98.3 F (36.8 C) (10/28 2124) Temp Source: Oral (10/28 2124) BP: 129/69 (10/28 2124) Pulse Rate: 86 (10/28 2124)  Labs:  Recent Labs  07/27/16 0420 07/28/16 0510  07/29/16 0449 07/29/16 0500 07/29/16 1451 07/29/16 2221  HGB 10.7* 11.0*  --   --  10.8*  --   --   HCT 33.1* 34.6*  --   --  34.3*  --   --   PLT 227 225  --   --  270  --   --   LABPROT 19.2* 16.7*  --   --  18.4*  --   --   INR 1.59 1.34  --   --  1.51  --   --   HEPARINUNFRC  --   --   < > <0.10*  --  <0.10* <0.10*  CREATININE 1.26* 1.37*  --   --  1.37*  --   --   < > = values in this interval not displayed.  Estimated Creatinine Clearance: 56.6 mL/min (by C-G formula based on SCr of 1.37 mg/dL (H)).   Assessment: 70 y.o. male s/p redo CABG and bioprosthetic MVR 10/10 and CRT-D 10/24, with atrial flutter and INR subtherapeutic, for heparin.  Infusing OK and IV site good per RN  Goal of Therapy:  Heparin level 0.3-0.7 units/ml Monitor platelets by anticoagulation protocol: Yes   Plan:  Increase Heparin  3000 unit/shr Follow-up am labs.   Abbott, Bronson Curb 07/29/2016,11:51 PM   ============================================ Addendum 1:36 AM RN called back just now, left arm where heparin is infusing through peripheral IV is now fairly large compared to right arm, IV site lost, will switch heparin over to right-side PICC, will decrease heparin back to 2400 units/hr given that we don't know how long IV site was lost  Narda Bonds, PharmD Clinical Pharmacist 07/30/2016 1:38 AM ==============================================

## 2016-07-30 LAB — BASIC METABOLIC PANEL
Anion gap: 9 (ref 5–15)
BUN: 28 mg/dL — AB (ref 6–20)
CALCIUM: 8.5 mg/dL — AB (ref 8.9–10.3)
CHLORIDE: 95 mmol/L — AB (ref 101–111)
CO2: 29 mmol/L (ref 22–32)
CREATININE: 1.31 mg/dL — AB (ref 0.61–1.24)
GFR calc non Af Amer: 54 mL/min — ABNORMAL LOW (ref >60)
Glucose, Bld: 125 mg/dL — ABNORMAL HIGH (ref 65–99)
Potassium: 4.5 mmol/L (ref 3.5–5.1)
SODIUM: 133 mmol/L — AB (ref 135–145)

## 2016-07-30 LAB — GLUCOSE, CAPILLARY
GLUCOSE-CAPILLARY: 128 mg/dL — AB (ref 65–99)
GLUCOSE-CAPILLARY: 90 mg/dL (ref 65–99)
GLUCOSE-CAPILLARY: 102 mg/dL — AB (ref 65–99)
Glucose-Capillary: 174 mg/dL — ABNORMAL HIGH (ref 65–99)

## 2016-07-30 LAB — COOXEMETRY PANEL
CARBOXYHEMOGLOBIN: 1.9 % — AB (ref 0.5–1.5)
Methemoglobin: 0.7 % (ref 0.0–1.5)
O2 SAT: 55.2 %
TOTAL HEMOGLOBIN: 10.2 g/dL — AB (ref 12.0–16.0)

## 2016-07-30 LAB — PROTIME-INR
INR: 1.86
PROTHROMBIN TIME: 21.7 s — AB (ref 11.4–15.2)

## 2016-07-30 MED ORDER — FUROSEMIDE 40 MG PO TABS
40.0000 mg | ORAL_TABLET | Freq: Two times a day (BID) | ORAL | Status: DC
Start: 2016-07-31 — End: 2016-08-01
  Administered 2016-07-31 – 2016-08-01 (×3): 40 mg via ORAL
  Filled 2016-07-30 (×3): qty 1

## 2016-07-30 MED ORDER — DOCUSATE SODIUM 100 MG PO CAPS
100.0000 mg | ORAL_CAPSULE | Freq: Every day | ORAL | Status: DC
  Administered 2016-07-30 – 2016-08-01 (×3): 100 mg via ORAL
  Filled 2016-07-30 (×3): qty 1

## 2016-07-30 MED ORDER — ENOXAPARIN SODIUM 100 MG/ML ~~LOC~~ SOLN
85.0000 mg | Freq: Two times a day (BID) | SUBCUTANEOUS | Status: DC
  Administered 2016-07-30 (×2): 85 mg via SUBCUTANEOUS
  Filled 2016-07-30 (×2): qty 1

## 2016-07-30 MED ORDER — FUROSEMIDE 10 MG/ML IJ SOLN
40.0000 mg | Freq: Once | INTRAMUSCULAR | Status: AC
  Administered 2016-07-30: 40 mg via INTRAVENOUS
  Filled 2016-07-30: qty 4

## 2016-07-30 MED ORDER — WARFARIN SODIUM 5 MG PO TABS
5.0000 mg | ORAL_TABLET | Freq: Every day | ORAL | Status: DC
  Administered 2016-07-30: 5 mg via ORAL
  Filled 2016-07-30: qty 1

## 2016-07-30 NOTE — Progress Notes (Signed)
Patient ID: JISAIAH COURTLAND, male   DOB: [birth year], 70 y.o.   MRN: [id number]    Advanced Heart Failure Rounding Note   Subjective:    S/P CABG + bioprosthetic MVR on 10/10  Atrial flutter => TEE-guided DCCV 10/18.  Underlying rhythm remained CHB.  S/p CRT-D placement 07/25/16.  Co-ox 55% this morning, stable.   Heparin infiltrated into arm, now left arm swelling.  Legs also swelling more.  Had some nausea this morning.   TEE (10/18): EF 20-25%, MV bioprosthesis looks ok, moderately decreased RV systolic function.   Objective:   Weight Range:  Vital Signs:   Temp:  [98.3 F (36.8 C)-98.5 F (36.9 C)] 98.5 F (36.9 C) (10/29 0606) Pulse Rate:  [86-90] 90 (10/29 0935) Resp:  [18] 18 (10/29 0606) BP: (112-139)/(57-72) 139/72 (10/29 0606) SpO2:  [97 %-100 %] 99 % (10/29 0606) Weight:  [189 lb 12.8 oz (86.1 kg)] 189 lb 12.8 oz (86.1 kg) (10/29 0606) Last BM Date: 07/27/16  Weight change: Filed Weights   07/28/16 0522 07/29/16 0414 07/30/16 0606  Weight: 186 lb 3.2 oz (84.5 kg) 188 lb 3.2 oz (85.4 kg) 189 lb 12.8 oz (86.1 kg)    Intake/Output:   Intake/Output Summary (Last 24 hours) at 07/30/16 1228 Last data filed at 07/30/16 1219  Gross per 24 hour  Intake              280 ml  Output              650 ml  Net             -370 ml    Physical Exam: General: NAD, sitting up in bed.  HEENT: Normal, except R neck cyst.  Neck: supple. JVP 8 cm. Carotids 2+ bilat; no bruits. No thyromegaly or nodule noted.  Cor: PMI nondisplaced. Regular S1S2. No M/G/R Lungs: Clear, normal effort Abdomen: soft, NT, ND, no HSM. No bruits or masses. +BS  Extremities: no cyanosis, clubbing, rash.  1+ edema to knees bilaterally Neuro: alert & orientedx3, cranial nerves grossly intact. moves all 4 extremities w/o difficulty. Affect pleasant  Telemetry: Reviewed, sinus rhythm with BiV pacing 90s  Labs: Basic Metabolic Panel:  Recent Labs Lab 07/26/16 0600 07/27/16 0420 07/28/16 0510  07/29/16 0500 07/30/16 0600  NA 135 133* 135 135 133*  K 4.1 4.2 4.2 4.6 4.5  CL 97* 96* 96* 96* 95*  CO2 29 27 30 28 29   GLUCOSE 148* 145* 161* 135* 125*  BUN 18 23* 23* 29* 28*  CREATININE 1.29* 1.26* 1.37* 1.37* 1.31*  CALCIUM 8.6* 8.3* 8.6* 8.7* 8.5*    Liver Function Tests: No results for input(s): AST, ALT, ALKPHOS, BILITOT, PROT, ALBUMIN in the last 168 hours. No results for input(s): LIPASE, AMYLASE in the last 168 hours. No results for input(s): AMMONIA in the last 168 hours.  CBC:  Recent Labs Lab 07/24/16 0223 07/25/16 0433 07/27/16 0420 07/28/16 0510 07/29/16 0500  WBC 14.4* 10.9* 11.2* 12.7* 11.1*  HGB 9.6* 9.2* 10.7* 11.0* 10.8*  HCT 29.7* 28.8* 33.1* 34.6* 34.3*  MCV 88.9 89.4 88.7 89.4 89.6  PLT 195 192 227 225 270    Cardiac Enzymes: No results for input(s): CKTOTAL, CKMB, CKMBINDEX, TROPONINI in the last 168 hours.  BNP: BNP (last 3 results) No results for input(s): BNP in the last 8760 hours.  ProBNP (last 3 results) No results for input(s): PROBNP in the last 8760 hours.    Other results:  Imaging: No results found.  Medications:     Scheduled Medications: . alteplase  2 mg Intracatheter Once  . amiodarone  200 mg Oral Daily  . bisacodyl  10 mg Oral Daily   Or  . bisacodyl  10 mg Rectal Daily  . digoxin  0.125 mg Oral Daily  . enoxaparin (LOVENOX) injection  85 mg Subcutaneous Q12H  . feeding supplement (ENSURE ENLIVE)  237 mL Oral BID BM  . furosemide  40 mg Intravenous Once  . [START ON 07/31/2016] furosemide  40 mg Oral BID  . insulin aspart  0-15 Units Subcutaneous TID WC  . metFORMIN  500 mg Oral BID WC  . pantoprazole  40 mg Oral QAC breakfast  . rosuvastatin  10 mg Oral QHS  . sacubitril-valsartan  1 tablet Oral BID  . sodium chloride flush  3 mL Intravenous Q12H  . spironolactone  25 mg Oral Daily  . warfarin  7.5 mg Oral q1800  . Warfarin - Physician Dosing Inpatient   Does not apply q1800    Infusions: .  sodium chloride Stopped (07/19/16 1344)    PRN Medications: sodium chloride, acetaminophen, Influenza vac split quadrivalent PF, metoprolol, ondansetron (ZOFRAN) IV, oxyCODONE, sodium chloride flush, sodium chloride flush, traMADol   Assessment/Plan/Discussion    1. CAD: s/p redo CABG with MVR.  - Continue statin.   2. Acute on chronic systolic CHF: Ischemic cardiomyopathy.  Pre-op EF 35-40%.  TEE 10/18 with EF 20-25%, moderate RV dysfunction.  Medtronic CRT-D placed with CHB.  Low co-ox suggestive of low output post-op, weaned off milrinone and now co-ox stable around 55%. Probably at least mild volume overload on exam.  - Off Coreg with low co-ox.  - Continue dixogin 0.125 mg daily. Check level in am.  - Will give 1 dose of IV Lasix today, increase po Lasix to bid.  - Continue spironolactone - Continue Entresto 24/26 mg BID.   3. Bradycardia: Underlying CHB.  Now s/p Medtronic CRT-D.  Currently sinus rhythm with BiV pacing.  4. Atrial flutter: Post-op.  DCCV on 10/18,  NSR today with BiV pacing.   - Restarted on coumadin. Dosing per pharm.    - Continue amiodarone 200 mg daily.  - He is off heparin gtt with infiltration => needs INR this morning (will be drawn via PICC).  If INR < 2, needs to get Lovenox bridge until INR > 2.  5. S/p bioprosthetic MVR: Stable on TEE 10/18.    Length of Stay: [address] 07/30/2016, 12:28 PM  Advanced Heart Failure Team Pager [phone] (M-F; 7a - 4p)  Please contact [facility] for night-coverage after hours (4p -7a ) and weekends on [email or web address]

## 2016-07-30 NOTE — Progress Notes (Addendum)
ANTICOAGULATION CONSULT NOTE - Follow Up Consult  Pharmacy Consult for Heparin Indication: tissue MVR / aflutter  Allergies  Allergen Reactions  . Xanax [Alprazolam] Other (See Comments)    Pt feels very weak, tired and feels paralyzed      Patient Measurements: Height: 6' (182.9 cm) Weight: 189 lb 12.8 oz (86.1 kg) IBW/kg (Calculated) : 77.6   Vital Signs: Temp: 98.5 F (36.9 C) (10/29 0606) Temp Source: Oral (10/29 0606) BP: 139/72 (10/29 0606) Pulse Rate: 90 (10/29 0935)  Labs:  Recent Labs  07/28/16 0510  07/29/16 0449 07/29/16 0500 07/29/16 1451 07/29/16 2221 07/30/16 0600  HGB 11.0*  --   --  10.8*  --   --   --   HCT 34.6*  --   --  34.3*  --   --   --   PLT 225  --   --  270  --   --   --   LABPROT 16.7*  --   --  18.4*  --   --   --   INR 1.34  --   --  1.51  --   --   --   HEPARINUNFRC  --   < > <0.10*  --  <0.10* <0.10*  --   CREATININE 1.37*  --   --  1.37*  --   --  1.31*  < > = values in this interval not displayed.  Estimated Creatinine Clearance: 59.2 mL/min (by C-G formula based on SCr of 1.31 mg/dL (H)).   Assessment: 70yom s/p redo CABG and tissue MVR on 10/10, started on coumadin with heparin bridge post-op for new aflutter. INR up to 1.51 yesterday, today's INR pending. CBC stable. PIV site infiltrated overnight, heparin now running through R-sided PICC and level empirically decreased since unsure how long site has been lost. Now transitioning to Lovenox until therapeutic on warfarin. CrCl~55-60. No bleed issues per RN.   Goal of Therapy:  INR 2-3 Anti-Xa level 0.6-1 units/ml 4hrs after LMWH dose given Monitor platelets by anticoagulation protocol: Yes   Plan:  D/c heparin IV Lovenox 85mg  [place] q12h - Give 1st dose 1 hour after heparin turned off (communicated w/ RN) Coumadin 7.5mg  daily per MD Daily INR, CBC at least q72h Monitor for s/sx bleeding   Elicia Lamp, PharmD, BCPS Clinical Pharmacist 07/30/2016 11:00 AM

## 2016-07-30 NOTE — Progress Notes (Addendum)
      [address]       [place] [address]             [phone]        5 Days Post-Op Procedure(s) (LRB): BiV ICD Insertion CRT-D (N/A)  Subjective: Patient unhappy this am. Had heparin running through left IV, IV infiltrated and had swelling/discomfort in arm.  Objective: Vital signs in last 24 hours: Temp:  [98.3 F (36.8 C)-98.5 F (36.9 C)] 98.5 F (36.9 C) (10/29 0606) Pulse Rate:  [86-96] 87 (10/29 0606) Cardiac Rhythm: Ventricular paced (10/29 0700) Resp:  [18] 18 (10/29 0606) BP: (112-139)/(57-72) 139/72 (10/29 0606) SpO2:  [97 %-100 %] 99 % (10/29 0606) Weight:  [189 lb 12.8 oz (86.1 kg)] 189 lb 12.8 oz (86.1 kg) (10/29 0606)  Pre op weight 81 kg Current Weight  07/30/16 189 lb 12.8 oz (86.1 kg)    Intake/Output from previous day: 10/28 0701 - 10/29 0700 In: 500 [P.O.:480; I.V.:20] Out: 875 [Urine:875]   Physical Exam:  Cardiovascular: Paced, regular Pulmonary: Clear to auscultation bilaterally Abdomen: Soft, non tender, bowel sounds present. Extremities: Bilateral lower extremity edema but is decreasing. [name] hose in place. Left arm is sore and swollen. No erythema. Wounds: Clean and dry.  No erythema or signs of infection.  Lab Results: CBC:  Recent Labs  07/28/16 0510 07/29/16 0500  WBC 12.7* 11.1*  HGB 11.0* 10.8*  HCT 34.6* 34.3*  PLT 225 270   BMET:   Recent Labs  07/29/16 0500 07/30/16 0600  NA 135 133*  K 4.6 4.5  CL 96* 95*  CO2 28 29  GLUCOSE 135* 125*  BUN 29* 28*  CREATININE 1.37* 1.31*  CALCIUM 8.7* 8.5*    PT/INR:  Lab Results  Component Value Date   INR 1.51 07/29/2016   INR 1.34 07/28/2016   INR 1.59 07/27/2016   ABG:  INR: Will add last result for INR, ABG once components are confirmed Will add last 4 CBG results once components are confirmed  Assessment/Plan: 1. CV-Paced. S/p CRT-D.On Heparin drip, Amiodarone 200 mg daily, Digoxin 0.125 mg daily, Spirolactone 25 mg daily, and  Coumadin. Co ox this am stable at 55.2. INR result not available yet this am. As discussed with Dr. Aundra Dubin, if INR 2 or >, stop Heparin drip. If less than 2, will need Lovenox per pharmacy. 2. Pulmonary- On room air. Encourage incentive spirometer. 3. Acute on chronic combined systolic and diastolic CHF- On Lasix 40 mg daily and Entresto po bid. Heart failure following. 4. Creatinine slightly decreased from 1.37 to 1.31. 5. ABL Anemia-Last H and H increased to 10.8 and 34.3 6. DM-CBGs 157/116/128 .  Continue Metformin 500 mg bid. Pre op HGA1C 6.    ZIMMERMAN,Christian Bowman 07/30/2016,7:59 AM  increased swelling left arm , due to heparin infiltration,  Unable to draw pt due to pic in left arm and swollen left arm and feet  Discussed with cardiology , will use lovenox to transition to therapeutic pro time  May need increased diuretic for home dose   I have seen and examined Christian Bowman and agree with the above assessment  and plan.  Grace Isaac MD [facility] [phone] Office [phone] 07/30/2016 11:53 AM

## 2016-07-31 LAB — PROTIME-INR
INR: 1.74
PROTHROMBIN TIME: 20.5 s — AB (ref 11.4–15.2)

## 2016-07-31 LAB — CBC
HCT: 31.4 % — ABNORMAL LOW (ref 39.0–52.0)
HEMOGLOBIN: 9.8 g/dL — AB (ref 13.0–17.0)
MCH: 28.4 pg (ref 26.0–34.0)
MCHC: 31.2 g/dL (ref 30.0–36.0)
MCV: 91 fL (ref 78.0–100.0)
Platelets: 216 10*3/uL (ref 150–400)
RBC: 3.45 MIL/uL — AB (ref 4.22–5.81)
RDW: 15.6 % — ABNORMAL HIGH (ref 11.5–15.5)
WBC: 8.2 10*3/uL (ref 4.0–10.5)

## 2016-07-31 LAB — GLUCOSE, CAPILLARY
GLUCOSE-CAPILLARY: 118 mg/dL — AB (ref 65–99)
GLUCOSE-CAPILLARY: 139 mg/dL — AB (ref 65–99)
Glucose-Capillary: 130 mg/dL — ABNORMAL HIGH (ref 65–99)
Glucose-Capillary: 128 mg/dL — ABNORMAL HIGH (ref 65–99)

## 2016-07-31 LAB — BASIC METABOLIC PANEL
Anion gap: 8 (ref 5–15)
BUN: 24 mg/dL — AB (ref 6–20)
CHLORIDE: 97 mmol/L — AB (ref 101–111)
CO2: 29 mmol/L (ref 22–32)
Calcium: 8.7 mg/dL — ABNORMAL LOW (ref 8.9–10.3)
Creatinine, Ser: 1.24 mg/dL (ref 0.61–1.24)
GFR calc Af Amer: >60 mL/min (ref >60)
GFR calc non Af Amer: 58 mL/min — ABNORMAL LOW (ref >60)
Glucose, Bld: 150 mg/dL — ABNORMAL HIGH (ref 65–99)
POTASSIUM: 4.5 mmol/L (ref 3.5–5.1)
SODIUM: 134 mmol/L — AB (ref 135–145)

## 2016-07-31 LAB — COOXEMETRY PANEL
Carboxyhemoglobin: 1.6 % — ABNORMAL HIGH (ref 0.5–1.5)
Methemoglobin: 0.7 % (ref 0.0–1.5)
O2 Saturation: 53.2 %
TOTAL HEMOGLOBIN: 10.5 g/dL — AB (ref 12.0–16.0)

## 2016-07-31 LAB — DIGOXIN LEVEL
DIGOXIN LVL: 0.5 ng/mL — AB (ref 0.8–2.0)
Digoxin Level: 0.8 ng/mL (ref 0.8–2.0)

## 2016-07-31 MED ORDER — ENOXAPARIN SODIUM 100 MG/ML ~~LOC~~ SOLN
85.0000 mg | SUBCUTANEOUS | Status: DC
  Administered 2016-07-31: 85 mg via SUBCUTANEOUS
  Filled 2016-07-31: qty 1

## 2016-07-31 MED ORDER — WARFARIN SODIUM 7.5 MG PO TABS
7.5000 mg | ORAL_TABLET | Freq: Every day | ORAL | Status: DC
  Administered 2016-07-31: 7.5 mg via ORAL
  Filled 2016-07-31: qty 1

## 2016-07-31 NOTE — Progress Notes (Signed)
CARDIAC REHAB PHASE I   PRE:  Rate/Rhythm: 87 pacing    BP: sitting 122/69    SaO2: 91-92 RA  MODE:  Ambulation: 960 ft   POST:  Rate/Rhythm: 111 pacing     BP: sitting 148/79     SaO2: 97 ra  Pt moving well. Able to stand independently and used RW. Long distance, occasional reminder to stand erect. No c/o. Return to recliner. Encouraged more walking. [place], ACSM 07/31/2016 9:29 AM

## 2016-07-31 NOTE — Progress Notes (Signed)
Patient ID: Christian Bowman, male   DOB: 02-29-1946, 70 y.o.   MRN: [id number]    Advanced Heart Failure Rounding Note   Subjective:    S/P CABG + bioprosthetic MVR on 10/10  Atrial flutter => TEE-guided DCCV 10/18.  Underlying rhythm remained CHB.  S/p CRT-D placement 07/25/16.  Co-ox 53% this morning, down from 55%.   Today INR 1.7. On lovenox .   Denies SOB. No nausea this morning.   TEE (10/18): EF 20-25%, MV bioprosthesis looks ok, moderately decreased RV systolic function.   Objective:   Weight Range:  Vital Signs:   Temp:  [97.7 F (36.5 C)-98.6 F (37 C)] 97.7 F (36.5 C) (10/30 0544) Pulse Rate:  [82-90] 89 (10/30 0544) Resp:  [17-18] 18 (10/30 0544) BP: (121-131)/(58-70) 121/68 (10/30 0544) SpO2:  [98 %-99 %] 99 % (10/30 0544) Weight:  [188 lb (85.3 kg)] 188 lb (85.3 kg) (10/30 0544) Last BM Date: 07/27/16  Weight change: Filed Weights   07/29/16 0414 07/30/16 0606 07/31/16 0544  Weight: 188 lb 3.2 oz (85.4 kg) 189 lb 12.8 oz (86.1 kg) 188 lb (85.3 kg)    Intake/Output:   Intake/Output Summary (Last 24 hours) at 07/31/16 0934 Last data filed at 07/30/16 2300  Gross per 24 hour  Intake              260 ml  Output             1575 ml  Net            -1315 ml    Physical Exam: General: NAD, in the chair.   HEENT: Normal, except R neck cyst.  Neck: supple. JVP 7-8 cm. Carotids 2+ bilat; no bruits. No thyromegaly or nodule noted.  Cor: PMI nondisplaced. Regular S1S2. No M/G/R Lungs: Clear, normal effort Abdomen: soft, NT, ND, no HSM. No bruits or masses. +BS  Extremities: no cyanosis, clubbing, rash.  1+ edema to knees bilaterally. [name] hose Neuro: alert & orientedx3, cranial nerves grossly intact. moves all 4 extremities w/o difficulty. Affect pleasant  Telemetry: Reviewed, sinus rhythm with BiV pacing 80s  Labs: Basic Metabolic Panel:  Recent Labs Lab 07/27/16 0420 07/28/16 0510 07/29/16 0500 07/30/16 0600 07/31/16 0424  NA 133* 135 135 133*  134*  K 4.2 4.2 4.6 4.5 4.5  CL 96* 96* 96* 95* 97*  CO2 27 30 28 29 29   GLUCOSE 145* 161* 135* 125* 150*  BUN 23* 23* 29* 28* 24*  CREATININE 1.26* 1.37* 1.37* 1.31* 1.24  CALCIUM 8.3* 8.6* 8.7* 8.5* 8.7*    Liver Function Tests: No results for input(s): AST, ALT, ALKPHOS, BILITOT, PROT, ALBUMIN in the last 168 hours. No results for input(s): LIPASE, AMYLASE in the last 168 hours. No results for input(s): AMMONIA in the last 168 hours.  CBC:  Recent Labs Lab 07/25/16 0433 07/27/16 0420 07/28/16 0510 07/29/16 0500 07/31/16 0424  WBC 10.9* 11.2* 12.7* 11.1* 8.2  HGB 9.2* 10.7* 11.0* 10.8* 9.8*  HCT 28.8* 33.1* 34.6* 34.3* 31.4*  MCV 89.4 88.7 89.4 89.6 91.0  PLT 192 227 225 270 216    Cardiac Enzymes: No results for input(s): CKTOTAL, CKMB, CKMBINDEX, TROPONINI in the last 168 hours.  BNP: BNP (last 3 results) No results for input(s): BNP in the last 8760 hours.  ProBNP (last 3 results) No results for input(s): PROBNP in the last 8760 hours.    Other results:  Imaging: No results found.   Medications:     Scheduled Medications: .  alteplase  2 mg Intracatheter Once  . amiodarone  200 mg Oral Daily  . bisacodyl  10 mg Oral Daily   Or  . bisacodyl  10 mg Rectal Daily  . digoxin  0.125 mg Oral Daily  . docusate sodium  100 mg Oral Daily  . enoxaparin (LOVENOX) injection  85 mg Subcutaneous Q24H  . feeding supplement (ENSURE ENLIVE)  237 mL Oral BID BM  . furosemide  40 mg Oral BID  . insulin aspart  0-15 Units Subcutaneous TID WC  . metFORMIN  500 mg Oral BID WC  . pantoprazole  40 mg Oral QAC breakfast  . rosuvastatin  10 mg Oral QHS  . sacubitril-valsartan  1 tablet Oral BID  . sodium chloride flush  3 mL Intravenous Q12H  . spironolactone  25 mg Oral Daily  . warfarin  7.5 mg Oral q1800  . Warfarin - Physician Dosing Inpatient   Does not apply q1800    Infusions: . sodium chloride Stopped (07/19/16 1344)    PRN Medications: sodium chloride,  acetaminophen, Influenza vac split quadrivalent PF, metoprolol, ondansetron (ZOFRAN) IV, oxyCODONE, sodium chloride flush, sodium chloride flush, traMADol   Assessment/Plan/Discussion    1. CAD: s/p redo CABG with MVR.  - Continue statin.   2. Acute on chronic systolic CHF: Ischemic cardiomyopathy.  Pre-op EF 35-40%.  TEE 10/18 with EF 20-25%, moderate RV dysfunction.  Medtronic CRT-D placed with CHB.  Low co-ox suggestive of low output post-op, weaned off milrinone and now co-ox stable around 53%. Repeat CO-OX in am.  - Off Coreg with low co-ox.  - Continue dixogin 0.125 mg daily. Dig evel 0.7  - Continue Lasix 40 mg twice a day.  - Continue spironolactone - Continue Entresto 24/26 mg BID.   3. Bradycardia: Underlying CHB.  Now s/p Medtronic CRT-D.  Currently sinus rhythm with BiV pacing.  4. Atrial flutter: Post-op.  DCCV on 10/18,  NSR today with BiV pacing.   - Restarted on coumadin. Dosing per pharm.    - Continue amiodarone 200 mg daily.  - He is off heparin gtt with infiltration => INR 1.74.   Lovenox bridge until INR > 2.  5. S/p bioprosthetic MVR: Stable on TEE 10/18.    Length of Stay: [place], NP-C  07/31/2016, 9:34 AM  Advanced Heart Failure Team Pager [phone] (M-F; [place])  Please contact [facility] for night-coverage after hours (4p -7a ) and weekends on [email or web address]  Patient seen with NP, agree with the above note.  Stable today.  Volume looks ok, no JVD though still with peripheral edema.  Continue Lasix bid.  Marginal co-ox so will keep off Coreg still.  No changes with other meds.  Cover with Lovenox until INR > 2.  Possible discharge tomorrow if he continues to be stable and cardiac surgery agrees.   Loralie Champagne 07/31/2016 3:19 PM

## 2016-07-31 NOTE — Consult Note (Signed)
   [facility] CM Inpatient Consult   07/31/2016  Christian Bowman 1946/06/02 [id number]    Patient screened for [place] Management services. Went to bedside to offer and explain [place] Management program with patient and wife. Spoke with [name] as patient was sleeping soundly. She states if they have questions post discharge, they will contact the doctor. She declined [place] Management follow up at this time. [name] also mentioned that they live in [place], [place]. She accepted [facility] brochure and 24-hr nurse line magnet with contact information to call in future if they change their mind. Will make inpatient RNCM aware that [place] Management services were declined at this time.  Marthenia Rolling, MSN-Ed, RN,BSN [facility] Liaison [phone]

## 2016-07-31 NOTE — Care Management Important Message (Signed)
Important Message  Patient Details  Name: Christian Bowman MRN: [id number] Date of Birth: March 15, 1946   Medicare Important Message Given:  Yes    Orbie Pyo 07/31/2016, 5:08 PM

## 2016-07-31 NOTE — Progress Notes (Signed)
Patient sitting up in bed, family present at bedside, no needs at this time. Call light within reach

## 2016-07-31 NOTE — Progress Notes (Signed)
Nutrition Follow Up  DOCUMENTATION CODES:   Severe malnutrition in context of chronic illness  INTERVENTION:    Continue Ensure Enlive po BID, each supplement provides 350 kcal and 20 grams of protein  NUTRITION DIAGNOSIS:   Increased nutrient needs related to  (post-op healing) as evidenced by estimated needs, ongoing  GOAL:   Patient will meet greater than or equal to 90% of their needs, met  MONITOR:   PO intake, Supplement acceptance, Labs, Weight trends, Skin, I & O's  ASSESSMENT:   70 yo Male with severe ischemic cardiomyopathy, chronic combined systolic and diastolic congestive heart failure, severe mitral regurgitation, hypertension, type 2 diabetes mellitus, and hyperlipidemia who is s/p redo coronary artery bypass grafting and mitral valve replacement.  Patient s/p procedures 10/10: REDO CORONARY ARTERY BYPASS GRAFTING x 2 MITRAL VALVE REPLACEMENT   Pt s/p CRT-D placement 10/25. PO intake good at 75-100% per flowsheets. Malnutrition ongoing >> drinking his Ensure Enlive supplements. Ready to go home.  Diet Order:  Diet heart healthy/carb modified Room service appropriate? Yes; Fluid consistency: Thin  Skin:  Reviewed, no issues  Last BM:  10/26  CBG (last 3)   Recent Labs  07/30/16 1633 07/30/16 2215 07/31/16 0622  GLUCAP 90 102* 118*   Height:   Ht Readings from Last 1 Encounters:  07/14/16 6' (1.829 m)    Weight:   Wt Readings from Last 1 Encounters:  07/31/16 188 lb (85.3 kg)    Wt Readings from Last 20 Encounters:  07/31/16 188 lb (85.3 kg)  07/10/16 180 lb (81.6 kg)  07/07/16 178 lb 1 oz (80.8 kg)  06/22/16 178 lb (80.7 kg)  06/21/16 180 lb (81.6 kg)  06/15/16 180 lb 12.8 oz (82 kg)  06/08/16 180 lb 12.8 oz (82 kg)  05/11/16 187 lb (84.8 kg)  04/28/16 180 lb 6.4 oz (81.8 kg)  03/08/16 190 lb 9.6 oz (86.5 kg)  12/20/15 193 lb 11.2 oz (87.9 kg)  11/11/15 196 lb (88.9 kg)  10/20/15 193 lb (87.5 kg)  09/08/15 202 lb 6 oz (91.8  kg)  08/12/15 203 lb 3.2 oz (92.2 kg)  07/28/15 204 lb 3.2 oz (92.6 kg)  07/21/15 197 lb (89.4 kg)  08/31/14 224 lb 3.2 oz (101.7 kg)  08/26/13 223 lb 9.6 oz (101.4 kg)    Ideal Body Weight:  81 kg  BMI:  Body mass index is 25.5 kg/m.  Estimated Nutritional Needs:   Kcal:  2200-2400  Protein:  120-130 gm  Fluid:  2.2-2.4 L  EDUCATION NEEDS:   No education needs identified at this time  Arthur Holms, RD, LDN Pager #: [phone] After-Hours Pager #: [phone]

## 2016-07-31 NOTE — Progress Notes (Addendum)
      [address]       [place] [address]             [phone]      6 Days Post-Op Procedure(s) (LRB): BiV ICD Insertion CRT-D (N/A)   Subjective:  Feeling okay.  Wants to go home.  He is having nausea after Lovenox is given.  Objective: Vital signs in last 24 hours: Temp:  [97.7 F (36.5 C)-98.6 F (37 C)] 97.7 F (36.5 C) (10/30 0544) Pulse Rate:  [82-90] 89 (10/30 0544) Cardiac Rhythm: Ventricular paced (10/29 1951) Resp:  [17-18] 18 (10/30 0544) BP: (121-131)/(58-70) 121/68 (10/30 0544) SpO2:  [98 %-99 %] 99 % (10/30 0544) Weight:  [188 lb (85.3 kg)] 188 lb (85.3 kg) (10/30 0544)  Intake/Output from previous day: 10/29 0701 - 10/30 0700 In: 260 [P.O.:240; I.V.:20] Out: 1700 [Urine:1700]  General appearance: alert, cooperative and no distress Heart: regular rate and rhythm Lungs: clear to auscultation bilaterally Abdomen: soft, non-tender; bowel sounds normal; no masses,  no organomegaly Extremities: edema trace LE, LUE improving Wound: clean and dry  Lab Results:  Recent Labs  07/29/16 0500 07/31/16 0424  WBC 11.1* 8.2  HGB 10.8* 9.8*  HCT 34.3* 31.4*  PLT 270 216   BMET:  Recent Labs  07/30/16 0600 07/31/16 0424  NA 133* 134*  K 4.5 4.5  CL 95* 97*  CO2 29 29  GLUCOSE 125* 150*  BUN 28* 24*  CREATININE 1.31* 1.24  CALCIUM 8.5* 8.7*    PT/INR:  Recent Labs  07/31/16 0424  LABPROT 20.5*  INR 1.74   ABG    Component Value Date/Time   PHART 7.373 07/12/2016 1531   HCO3 23.3 07/12/2016 1531   TCO2 25 07/12/2016 1531   ACIDBASEDEF 2.0 07/12/2016 1531   O2SAT 53.2 07/31/2016 0437   CBG (last 3)   Recent Labs  07/30/16 1633 07/30/16 2215 07/31/16 0622  GLUCAP 90 102* 118*    Assessment/Plan: S/P Procedure(s) (LRB): BiV ICD Insertion CRT-D (N/A)  1. CV- Paced, S/P CRT-D- on Amiodarone, Digoxin.[name] Co-Ox 53 this morning 2. INR 1.74, which is a slight drop... Continue Lovenox... Increase Coumadin to 7.5 mg  daily which patient was responding well to 3. Renal- creatinine ok, weight is trending down... On lasix, spironolactone per HF 4. GI- nausea with Lovenox, instructed nursing to administer zofran a little before administration 5. DM- sugars stable 6. Dispo- patient stable, increase coumadin, continue Lovenox, zofran prn for nausea.Marland Kitchendiuretics per HF   LOS: 20 days    BARRETT, ERIN 07/31/2016   I have seen and examined the patient and agree with the assessment and plan as outlined.  Increase dose back to 7.5 mg/day.  Remainder of plan per advanced heart failure team.    Rexene Alberts, MD 07/31/2016 8:35 AM

## 2016-08-01 LAB — BASIC METABOLIC PANEL
Anion gap: 7 (ref 5–15)
BUN: 25 mg/dL — AB (ref 6–20)
CHLORIDE: 98 mmol/L — AB (ref 101–111)
CO2: 31 mmol/L (ref 22–32)
CREATININE: 1.39 mg/dL — AB (ref 0.61–1.24)
Calcium: 8.5 mg/dL — ABNORMAL LOW (ref 8.9–10.3)
GFR calc Af Amer: 59 mL/min — ABNORMAL LOW (ref >60)
GFR calc non Af Amer: 51 mL/min — ABNORMAL LOW (ref >60)
GLUCOSE: 119 mg/dL — AB (ref 65–99)
Potassium: 4.3 mmol/L (ref 3.5–5.1)
SODIUM: 136 mmol/L (ref 135–145)

## 2016-08-01 LAB — GLUCOSE, CAPILLARY
Glucose-Capillary: 101 mg/dL — ABNORMAL HIGH (ref 65–99)
Glucose-Capillary: 138 mg/dL — ABNORMAL HIGH (ref 65–99)

## 2016-08-01 LAB — PROTIME-INR
INR: 1.72
PROTHROMBIN TIME: 20.4 s — AB (ref 11.4–15.2)

## 2016-08-01 LAB — COOXEMETRY PANEL
Carboxyhemoglobin: 1.8 % — ABNORMAL HIGH (ref 0.5–1.5)
METHEMOGLOBIN: 0.7 % (ref 0.0–1.5)
O2 SAT: 66.8 %
TOTAL HEMOGLOBIN: 13 g/dL (ref 12.0–16.0)

## 2016-08-01 MED ORDER — SACUBITRIL-VALSARTAN 24-26 MG PO TABS: 1.0000 | ORAL_TABLET | Freq: Two times a day (BID) | ORAL | 3 refills | Status: DC

## 2016-08-01 MED ORDER — ENOXAPARIN (LOVENOX) PATIENT EDUCATION KIT
PACK | Freq: Once | Status: DC
  Filled 2016-08-01: qty 1

## 2016-08-01 MED ORDER — WARFARIN SODIUM 7.5 MG PO TABS: 7.5000 mg | ORAL_TABLET | Freq: Every day | ORAL | 3 refills | Status: DC

## 2016-08-01 MED ORDER — ENOXAPARIN (LOVENOX) PATIENT EDUCATION KIT: PACK | Status: DC

## 2016-08-01 MED ORDER — AMIODARONE HCL 200 MG PO TABS: 200.0000 mg | ORAL_TABLET | Freq: Every day | ORAL | 3 refills | Status: DC

## 2016-08-01 MED ORDER — AMIODARONE HCL 200 MG PO TABS: 200.0000 mg | ORAL_TABLET | Freq: Every day | ORAL | 1 refills | Status: DC

## 2016-08-01 MED ORDER — ONDANSETRON 4 MG PO TBDP: 4.0000 mg | ORAL_TABLET | Freq: Three times a day (TID) | ORAL | 0 refills | Status: DC | PRN

## 2016-08-01 MED ORDER — DIGOXIN 125 MCG PO TABS: 0.0625 mg | ORAL_TABLET | Freq: Every day | ORAL | 1 refills | Status: DC

## 2016-08-01 MED ORDER — ACETAMINOPHEN 325 MG PO TABS: 325.0000 mg | ORAL_TABLET | ORAL | Status: DC | PRN

## 2016-08-01 MED ORDER — OXYCODONE HCL 5 MG PO TABS: ORAL_TABLET | ORAL | 0 refills | Status: DC

## 2016-08-01 MED ORDER — DIGOXIN 125 MCG PO TABS: 0.1250 mg | ORAL_TABLET | Freq: Every day | ORAL | 3 refills | Status: DC

## 2016-08-01 MED ORDER — POTASSIUM CHLORIDE CRYS ER 10 MEQ PO TBCR: 10.0000 meq | EXTENDED_RELEASE_TABLET | Freq: Every day | ORAL | 1 refills | Status: DC

## 2016-08-01 MED ORDER — OXYCODONE HCL 5 MG PO TABS: 5.0000 mg | ORAL_TABLET | ORAL | 0 refills | Status: DC | PRN

## 2016-08-01 MED ORDER — SACUBITRIL-VALSARTAN 24-26 MG PO TABS: 1.0000 | ORAL_TABLET | Freq: Two times a day (BID) | ORAL | 1 refills | Status: DC

## 2016-08-01 MED ORDER — INFLUENZA VAC SPLIT QUAD 0.5 ML IM SUSY
0.5000 mL | PREFILLED_SYRINGE | Freq: Once | INTRAMUSCULAR | Status: AC
  Administered 2016-08-01: 0.5 mL via INTRAMUSCULAR

## 2016-08-01 MED ORDER — ENOXAPARIN SODIUM 100 MG/ML ~~LOC~~ SOLN: 85.0000 mg | SUBCUTANEOUS | 1 refills | Status: DC

## 2016-08-01 MED ORDER — SPIRONOLACTONE 25 MG PO TABS: 25.0000 mg | ORAL_TABLET | Freq: Every day | ORAL | 3 refills | Status: DC

## 2016-08-01 NOTE — Progress Notes (Signed)
Patient ID: MHER JENTSCH, male   DOB: Apr 27, 1946, 70 y.o.   MRN: [id number]    Advanced Heart Failure Rounding Note   Subjective:    S/P CABG + bioprosthetic MVR on 10/10  Atrial flutter => TEE-guided DCCV 10/18.  Underlying rhythm remained CHB.  S/p CRT-D placement 07/25/16.  Co-ox 67% up this morning.    Today INR 1.72. On lovenox .   Wants to go home. Denies SOB.   TEE (10/18): EF 20-25%, MV bioprosthesis looks ok, moderately decreased RV systolic function.   Objective:   Weight Range:  Vital Signs:   Temp:  [97.6 F (36.4 C)-98.8 F (37.1 C)] 97.6 F (36.4 C) (10/31 0539) Pulse Rate:  [80-87] 82 (10/31 0539) Resp:  [17-18] 18 (10/31 0539) BP: (106-125)/(57-65) 125/57 (10/31 0539) SpO2:  [96 %-99 %] 96 % (10/31 0539) Weight:  [186 lb 14.4 oz (84.8 kg)] 186 lb 14.4 oz (84.8 kg) (10/31 0539) Last BM Date: 07/31/16  Weight change: Filed Weights   07/30/16 0606 07/31/16 0544 08/01/16 0539  Weight: 189 lb 12.8 oz (86.1 kg) 188 lb (85.3 kg) 186 lb 14.4 oz (84.8 kg)    Intake/Output:   Intake/Output Summary (Last 24 hours) at 08/01/16 0904 Last data filed at 08/01/16 0547  Gross per 24 hour  Intake              380 ml  Output             1875 ml  Net            -1495 ml    Physical Exam: General: NAD, in the bed.   HEENT: Normal, except R neck cyst.  Neck: supple. JVP 7-8 cm. Carotids 2+ bilat; no bruits. No thyromegaly or nodule noted.  Cor: PMI nondisplaced. Regular S1S2. No M/G/R Lungs: Clear, normal effort Abdomen: soft, NT, ND, no HSM. No bruits or masses. +BS  Extremities: no cyanosis, clubbing, rash.  1+ edema to knees bilaterally. [name] hose Neuro: alert & orientedx3, cranial nerves grossly intact. moves all 4 extremities w/o difficulty. Affect pleasant  Telemetry: Reviewed, sinus rhythm with BiV pacing 80s  Labs: Basic Metabolic Panel:  Recent Labs Lab 07/28/16 0510 07/29/16 0500 07/30/16 0600 07/31/16 0424 08/01/16 0510  NA 135 135 133* 134*  136  K 4.2 4.6 4.5 4.5 4.3  CL 96* 96* 95* 97* 98*  CO2 30 28 29 29 31   GLUCOSE 161* 135* 125* 150* 119*  BUN 23* 29* 28* 24* 25*  CREATININE 1.37* 1.37* 1.31* 1.24 1.39*  CALCIUM 8.6* 8.7* 8.5* 8.7* 8.5*    Liver Function Tests: No results for input(s): AST, ALT, ALKPHOS, BILITOT, PROT, ALBUMIN in the last 168 hours. No results for input(s): LIPASE, AMYLASE in the last 168 hours. No results for input(s): AMMONIA in the last 168 hours.  CBC:  Recent Labs Lab 07/27/16 0420 07/28/16 0510 07/29/16 0500 07/31/16 0424  WBC 11.2* 12.7* 11.1* 8.2  HGB 10.7* 11.0* 10.8* 9.8*  HCT 33.1* 34.6* 34.3* 31.4*  MCV 88.7 89.4 89.6 91.0  PLT 227 225 270 216    Cardiac Enzymes: No results for input(s): CKTOTAL, CKMB, CKMBINDEX, TROPONINI in the last 168 hours.  BNP: BNP (last 3 results) No results for input(s): BNP in the last 8760 hours.  ProBNP (last 3 results) No results for input(s): PROBNP in the last 8760 hours.    Other results:  Imaging: No results found.   Medications:     Scheduled Medications: . alteplase  2  mg Intracatheter Once  . amiodarone  200 mg Oral Daily  . bisacodyl  10 mg Oral Daily   Or  . bisacodyl  10 mg Rectal Daily  . digoxin  0.125 mg Oral Daily  . docusate sodium  100 mg Oral Daily  . enoxaparin (LOVENOX) injection  85 mg Subcutaneous Q24H  . feeding supplement (ENSURE ENLIVE)  237 mL Oral BID BM  . furosemide  40 mg Oral BID  . Influenza vac split quadrivalent PF  0.5 mL Intramuscular Once  . insulin aspart  0-15 Units Subcutaneous TID WC  . metFORMIN  500 mg Oral BID WC  . pantoprazole  40 mg Oral QAC breakfast  . rosuvastatin  10 mg Oral QHS  . sacubitril-valsartan  1 tablet Oral BID  . sodium chloride flush  3 mL Intravenous Q12H  . spironolactone  25 mg Oral Daily  . warfarin  7.5 mg Oral q1800  . Warfarin - Physician Dosing Inpatient   Does not apply q1800    Infusions: . sodium chloride Stopped (07/19/16 1344)    PRN  Medications: sodium chloride, acetaminophen, Influenza vac split quadrivalent PF, metoprolol, ondansetron (ZOFRAN) IV, oxyCODONE, sodium chloride flush, sodium chloride flush, traMADol   Assessment/Plan/Discussion    1. CAD: s/p redo CABG with MVR.  - Continue statin.   2. Acute on chronic systolic CHF: Ischemic cardiomyopathy.  Pre-op EF 35-40%.  TEE 10/18 with EF 20-25%, moderate RV dysfunction.  Medtronic CRT-D placed with CHB.  Low co-ox suggestive of low output post-op, weaned off milrinone and now co-ox stable around 53%. Repeat CO-OX in am.  - Off Coreg with low co-ox.  - Cut back dig to 0.0625 mg daily.  Dig evel 0.8 - Continue Lasix 40 mg twice a day.  - Continue spironolactone - Continue Entresto 24/26 mg BID.   3. Bradycardia: Underlying CHB.  Now s/p Medtronic CRT-D.  Currently sinus rhythm with BiV pacing.  4. Atrial flutter: Post-op.  DCCV on 10/18,  NSR today with BiV pacing.   - Continue coumadin.  - Continue amiodarone 200 mg daily. Will need LFTs TSH next visit. He will need yearly eyes exams as well.  - He is off heparin gtt with infiltration => INR 1.72.   Lovenox bridge until INR > 2.  5. S/P bioprosthetic MVR: Stable on TEE 10/18.    Cardiac meds for d/c Digoxin 0.0625 mg daily Lasix 40 mg twice a day Spironolactone 25 mg daily Entresto 24-26 mg twice a day Amiodarone 200 mg daily  Crestor 10 mg daily Warfarin with Lovenox overlap to INR at least 2  Length of Stay: [place], NP-C  08/01/2016, 9:04 AM  Advanced Heart Failure Team Pager [phone] (M-F; 7a - 4p)  Please contact [facility] for night-coverage after hours (4p -7a ) and weekends on [email or web address]  Patient seen with NP, agree with the above note.  I think he is stable for discharge.  Will need Lovenox bridge to therapeutic INR.  He wants to draw INR at [facility] in [place] and send to [facility] office for adjustment.  Will need to arrange.  He will need followup in CHF clinic in about 10  days.  Would have him get BMET at [facility] in [place] in 7 days (prior to appt).   Loralie Champagne 08/01/2016 9:47 AM

## 2016-08-01 NOTE — Progress Notes (Signed)
CT sutures d/c'd per order and per protocol. Sites painted with tincture, steri-strips applied. Pt and wife educated not to remove steri-strips and that they will fall off on their own.

## 2016-08-01 NOTE — Care Management Note (Signed)
Case Management Note Previous CM note initiated by Maryclare Labrador, RN 07/25/2016, 11:15 AM    Patient Details  Name: Christian Bowman MRN: [id number] Date of Birth: 1945/10/06  Subjective/Objective:    S/p CABG and MVR                Action/Plan:  PTA independent from home with wife - wife at bedside.  Wife states she will be with pt 24/7 post discharge.  CM will continue to follow for discharge needs   Expected Discharge Date:   08/01/16               Expected Discharge Plan:  Home/Self Care  In-House Referral:     Discharge planning Services  CM Consult  Post Acute Care Choice:  Durable Medical Equipment Choice offered to:  Patient  DME Arranged:  Gilford Rile rolling DME Agency:  [place]:    [place]:     Status of Service:  Completed, signed off  If discussed at [place] of Stay Meetings, dates discussed:    Additional Comments:  08/01/16- 1030- Herald Vallin RN, CM- pt for d/c home today- per cardiac rehab pt needs RW for home- order has been placed- notified Jermaine with [facility] for DME need- RW to be delivered to room prior to discharge.   Maryclare Labrador, RN 07/25/2016, 11:15 AM--Pt is now 1 day s/p ICD/Biv pacer placement for complete HB post CABG and MVR.     Dahlia Client [place], RN 08/01/2016, 10:29 AM [phone]

## 2016-08-01 NOTE — Progress Notes (Addendum)
      [address]       [place] [address]             [phone]    R21DaysPost-OpS/P Procedure(s) (LRB): REDO CORONARY ARTERY BYPASS GRAFTING (CABG) x two using left leg greater saphenous vein harvested endoscopically-SVG to PDA -SVG to RAMUS INTERMEDIATE (N/A) TRANSESOPHAGEAL ECHOCARDIOGRAM (TEE) (N/A) MITRAL VALVE (MV) REPLACEMENT (N/A)   R13 Days Post-Op Procedure(s) (LRB): CARDIOVERSION (N/A) TRANSESOPHAGEAL ECHOCARDIOGRAM (TEE) (N/A)   R7 Days Post-Op Procedure(s) (LRB): BiV ICD Insertion CRT-D (N/A)   Subjective:  Christian Bowman states he didn't sleep well last night.  He states he was in pain, which he feels was related to him laying on his side.  He is ready to go.  Objective: Vital signs in last 24 hours: Temp:  [97.6 F (36.4 C)-98.8 F (37.1 C)] 97.6 F (36.4 C) (10/31 0539) Pulse Rate:  [80-87] 82 (10/31 0539) Cardiac Rhythm: Ventricular paced (10/30 2221) Resp:  [17-18] 18 (10/31 0539) BP: (106-125)/(57-65) 125/57 (10/31 0539) SpO2:  [96 %-99 %] 96 % (10/31 0539) Weight:  [186 lb 14.4 oz (84.8 kg)] 186 lb 14.4 oz (84.8 kg) (10/31 0539)  Intake/Output from previous day: 10/30 0701 - 10/31 0700 In: 620 [P.O.:600; I.V.:20] Out: 1875 [Urine:1875]  General appearance: alert, cooperative and no distress Heart: regular rate and rhythm and paced Lungs: clear to auscultation bilaterally Abdomen: soft, non-tender; bowel sounds normal; no masses,  no organomegaly Extremities: edema trace, LUE improving Wound: clean and dry  Lab Results:  Recent Labs  07/31/16 0424  WBC 8.2  HGB 9.8*  HCT 31.4*  PLT 216   BMET:  Recent Labs  07/31/16 0424 08/01/16 0510  NA 134* 136  K 4.5 4.3  CL 97* 98*  CO2 29 31  GLUCOSE 150* 119*  BUN 24* 25*  CREATININE 1.24 1.39*  CALCIUM 8.7* 8.5*    PT/INR:  Recent Labs  08/01/16 0510  LABPROT 20.4*  INR 1.72   ABG    Component Value Date/Time   PHART 7.373 07/12/2016 1531   HCO3 23.3  07/12/2016 1531   TCO2 25 07/12/2016 1531   ACIDBASEDEF 2.0 07/12/2016 1531   O2SAT 66.8 08/01/2016 0535   CBG (last 3)   Recent Labs  07/31/16 1646 07/31/16 2157 08/01/16 0639  GLUCAP 139* 128* 101*    Assessment/Plan: S/P Procedure(s) (LRB): BiV ICD Insertion CRT-D (N/A)  1. CV- Co-Ox improved up to 66.8- on Amiodarone, Digoxin... With improvement of Co-ox may be able to resume low dose Coreg 2. INR 1.72, will need Lovenox bridge 3. Renal- creatinine slightly elevated at 1.39, weight is trending down.. On Lasix, Spironolactone... Diuretics per HF 4. DM- sugars stable 5. Dispo- patient stable, will plan for d/c home today if okay with AHF   LOS: 21 days    BARRETT, ERIN 08/01/2016  I have seen and examined the patient and agree with the assessment and plan as outlined.  D/C home today  Rexene Alberts, MD 08/01/2016 11:58 AM

## 2016-08-02 ENCOUNTER — Other Ambulatory Visit (HOSPITAL_COMMUNITY): Payer: Self-pay | Admitting: Physician Assistant

## 2016-08-02 ENCOUNTER — Other Ambulatory Visit: Payer: Self-pay | Admitting: *Deleted

## 2016-08-02 DIAGNOSIS — Z87898 Personal history of other specified conditions: Secondary | ICD-10-CM

## 2016-08-02 MED ORDER — ONDANSETRON 4 MG PO TBDP: 4.0000 mg | ORAL_TABLET | Freq: Three times a day (TID) | ORAL | 0 refills | Status: DC | PRN

## 2016-08-03 ENCOUNTER — Telehealth: Payer: Self-pay | Admitting: Pharmacist Clinician (PhC)/ Clinical Pharmacy Specialist

## 2016-08-03 DIAGNOSIS — R231 Pallor: Secondary | ICD-10-CM | POA: Diagnosis not present

## 2016-08-03 DIAGNOSIS — I25810 Atherosclerosis of coronary artery bypass graft(s) without angina pectoris: Secondary | ICD-10-CM | POA: Diagnosis not present

## 2016-08-03 DIAGNOSIS — Z7901 Long term (current) use of anticoagulants: Secondary | ICD-10-CM | POA: Diagnosis not present

## 2016-08-03 DIAGNOSIS — I252 Old myocardial infarction: Secondary | ICD-10-CM | POA: Diagnosis not present

## 2016-08-03 DIAGNOSIS — Z95810 Presence of automatic (implantable) cardiac defibrillator: Secondary | ICD-10-CM | POA: Diagnosis not present

## 2016-08-03 DIAGNOSIS — R079 Chest pain, unspecified: Secondary | ICD-10-CM | POA: Diagnosis not present

## 2016-08-03 DIAGNOSIS — Z951 Presence of aortocoronary bypass graft: Secondary | ICD-10-CM | POA: Diagnosis not present

## 2016-08-03 DIAGNOSIS — I110 Hypertensive heart disease with heart failure: Secondary | ICD-10-CM | POA: Diagnosis not present

## 2016-08-03 DIAGNOSIS — E119 Type 2 diabetes mellitus without complications: Secondary | ICD-10-CM | POA: Diagnosis not present

## 2016-08-03 DIAGNOSIS — Z888 Allergy status to other drugs, medicaments and biological substances status: Secondary | ICD-10-CM | POA: Diagnosis not present

## 2016-08-03 DIAGNOSIS — R0789 Other chest pain: Secondary | ICD-10-CM | POA: Diagnosis not present

## 2016-08-03 DIAGNOSIS — R61 Generalized hyperhidrosis: Secondary | ICD-10-CM | POA: Diagnosis not present

## 2016-08-03 DIAGNOSIS — Z794 Long term (current) use of insulin: Secondary | ICD-10-CM | POA: Diagnosis not present

## 2016-08-03 DIAGNOSIS — I4891 Unspecified atrial fibrillation: Secondary | ICD-10-CM | POA: Diagnosis not present

## 2016-08-03 DIAGNOSIS — R0600 Dyspnea, unspecified: Secondary | ICD-10-CM | POA: Diagnosis not present

## 2016-08-03 DIAGNOSIS — Z955 Presence of coronary angioplasty implant and graft: Secondary | ICD-10-CM | POA: Diagnosis not present

## 2016-08-03 DIAGNOSIS — I4892 Unspecified atrial flutter: Secondary | ICD-10-CM | POA: Diagnosis not present

## 2016-08-03 DIAGNOSIS — I509 Heart failure, unspecified: Secondary | ICD-10-CM | POA: Diagnosis not present

## 2016-08-03 NOTE — Telephone Encounter (Signed)
Patient wife called, they were unable to make New Coumadin appt this am in [place], as patient was ill and wife had to call EMS to take to hospital .  INR in hospital today was 2.5.  Advised that it was okay to d/c the lovenox injections and made a new appointment for him to be seen by coumadin clinic in [place] on Monday.    Wife voiced understanding.

## 2016-08-07 ENCOUNTER — Ambulatory Visit (INDEPENDENT_AMBULATORY_CARE_PROVIDER_SITE_OTHER): Payer: Medicare Other | Admitting: *Deleted

## 2016-08-07 DIAGNOSIS — Z953 Presence of xenogenic heart valve: Secondary | ICD-10-CM

## 2016-08-07 DIAGNOSIS — I483 Typical atrial flutter: Secondary | ICD-10-CM

## 2016-08-07 DIAGNOSIS — Z5181 Encounter for therapeutic drug level monitoring: Secondary | ICD-10-CM

## 2016-08-07 DIAGNOSIS — I2510 Atherosclerotic heart disease of native coronary artery without angina pectoris: Secondary | ICD-10-CM

## 2016-08-07 LAB — POCT INR: INR: 2.5

## 2016-08-10 ENCOUNTER — Ambulatory Visit (INDEPENDENT_AMBULATORY_CARE_PROVIDER_SITE_OTHER): Payer: Medicare Other | Admitting: *Deleted

## 2016-08-10 DIAGNOSIS — I483 Typical atrial flutter: Secondary | ICD-10-CM | POA: Diagnosis not present

## 2016-08-10 DIAGNOSIS — I5042 Chronic combined systolic (congestive) and diastolic (congestive) heart failure: Secondary | ICD-10-CM | POA: Diagnosis not present

## 2016-08-10 LAB — CUP PACEART INCLINIC DEVICE CHECK
Battery Remaining Longevity: 78 mo
Brady Statistic AP VS Percent: 0.01 %
Brady Statistic AS VS Percent: 1.83 %
Brady Statistic RA Percent Paced: 0.35 %
HighPow Impedance: 52 Ohm
Implantable Lead Implant Date: 20171024
Implantable Lead Implant Date: 20171024
Implantable Lead Location: 753858
Implantable Lead Location: 753860
Implantable Lead Model: 4598
Implantable Lead Model: 5076
Lead Channel Impedance Value: 189.525
Lead Channel Impedance Value: 205.114
Lead Channel Impedance Value: 361 Ohm
Lead Channel Impedance Value: 361 Ohm
Lead Channel Impedance Value: 399 Ohm
Lead Channel Impedance Value: 456 Ohm
Lead Channel Impedance Value: 475 Ohm
Lead Channel Impedance Value: 608 Ohm
Lead Channel Impedance Value: 646 Ohm
Lead Channel Impedance Value: 722 Ohm
Lead Channel Pacing Threshold Amplitude: 0.625 V
Lead Channel Pacing Threshold Pulse Width: 0.4 ms
Lead Channel Pacing Threshold Pulse Width: 1 ms
Lead Channel Sensing Intrinsic Amplitude: 1.125 mV
Lead Channel Sensing Intrinsic Amplitude: 1.625 mV
Lead Channel Sensing Intrinsic Amplitude: 10.375 mV
Lead Channel Setting Pacing Amplitude: 2.5 V
Lead Channel Setting Pacing Pulse Width: 1 ms
MDC IDC LEAD IMPLANT DT: 20171024
MDC IDC LEAD LOCATION: 753859
MDC IDC MSMT BATTERY VOLTAGE: 3.05 V
MDC IDC MSMT LEADCHNL LV IMPEDANCE VALUE: 180.5 Ohm
MDC IDC MSMT LEADCHNL LV IMPEDANCE VALUE: 205.114
MDC IDC MSMT LEADCHNL LV IMPEDANCE VALUE: 216.848
MDC IDC MSMT LEADCHNL LV IMPEDANCE VALUE: 551 Ohm
MDC IDC MSMT LEADCHNL LV IMPEDANCE VALUE: 703 Ohm
MDC IDC MSMT LEADCHNL LV IMPEDANCE VALUE: 703 Ohm
MDC IDC MSMT LEADCHNL LV PACING THRESHOLD AMPLITUDE: 0.875 V
MDC IDC MSMT LEADCHNL RA PACING THRESHOLD AMPLITUDE: 0.625 V
MDC IDC MSMT LEADCHNL RA PACING THRESHOLD PULSEWIDTH: 0.4 ms
MDC IDC MSMT LEADCHNL RV IMPEDANCE VALUE: 342 Ohm
MDC IDC MSMT LEADCHNL RV IMPEDANCE VALUE: 399 Ohm
MDC IDC PG IMPLANT DT: 20171024
MDC IDC SESS DTM: 20171109164307
MDC IDC SET LEADCHNL RA PACING AMPLITUDE: 3.5 V
MDC IDC SET LEADCHNL RV PACING AMPLITUDE: 3.5 V
MDC IDC SET LEADCHNL RV PACING PULSEWIDTH: 0.4 ms
MDC IDC SET LEADCHNL RV SENSING SENSITIVITY: 0.3 mV
MDC IDC STAT BRADY AP VP PERCENT: 0.35 %
MDC IDC STAT BRADY AS VP PERCENT: 97.81 %
MDC IDC STAT BRADY RV PERCENT PACED: 97.75 %

## 2016-08-10 NOTE — Progress Notes (Signed)
Wound check appointment. Steri-strips removed. Wound without redness or edema. Incision edges approximated, wound well healed. Normal device function. Thresholds, sensing, and impedances consistent with implant measurements. Device programmed at 3.5V for extra safety margin until 3 month visit. Histogram distribution appropriate for patient and level of activity. 9.7% AT/AF burden (WC aware, no changes recommended) + Amio/warfarin. No ventricular arrhythmias noted. Patient educated about wound care, arm mobility, lifting restrictions, shock plan. ROV in 3 months with WC.

## 2016-08-14 ENCOUNTER — Ambulatory Visit (HOSPITAL_COMMUNITY)
Admission: RE | Admit: 2016-08-14 | Discharge: 2016-08-14 | Disposition: A | Discharge status: Home / Self Care | Payer: Medicare Other | Source: Ambulatory Visit | Attending: Cardiology | Admitting: Cardiology

## 2016-08-14 ENCOUNTER — Ambulatory Visit (INDEPENDENT_AMBULATORY_CARE_PROVIDER_SITE_OTHER): Payer: Medicare Other | Admitting: *Deleted

## 2016-08-14 ENCOUNTER — Telehealth (HOSPITAL_COMMUNITY): Payer: Self-pay | Admitting: Vascular Surgery

## 2016-08-14 ENCOUNTER — Encounter (HOSPITAL_COMMUNITY): Payer: Self-pay

## 2016-08-14 VITALS — BP 142/64 | HR 89 | Wt 174.8 lb

## 2016-08-14 DIAGNOSIS — N183 Chronic kidney disease, stage 3 (moderate): Secondary | ICD-10-CM | POA: Insufficient documentation

## 2016-08-14 DIAGNOSIS — I5022 Chronic systolic (congestive) heart failure: Secondary | ICD-10-CM | POA: Diagnosis not present

## 2016-08-14 DIAGNOSIS — I340 Nonrheumatic mitral (valve) insufficiency: Secondary | ICD-10-CM

## 2016-08-14 DIAGNOSIS — I255 Ischemic cardiomyopathy: Secondary | ICD-10-CM | POA: Insufficient documentation

## 2016-08-14 DIAGNOSIS — I5023 Acute on chronic systolic (congestive) heart failure: Secondary | ICD-10-CM | POA: Diagnosis not present

## 2016-08-14 DIAGNOSIS — E1122 Type 2 diabetes mellitus with diabetic chronic kidney disease: Secondary | ICD-10-CM | POA: Diagnosis not present

## 2016-08-14 DIAGNOSIS — Z5181 Encounter for therapeutic drug level monitoring: Secondary | ICD-10-CM | POA: Diagnosis not present

## 2016-08-14 DIAGNOSIS — I2510 Atherosclerotic heart disease of native coronary artery without angina pectoris: Secondary | ICD-10-CM

## 2016-08-14 DIAGNOSIS — Z951 Presence of aortocoronary bypass graft: Secondary | ICD-10-CM | POA: Insufficient documentation

## 2016-08-14 DIAGNOSIS — Z79899 Other long term (current) drug therapy: Secondary | ICD-10-CM | POA: Insufficient documentation

## 2016-08-14 DIAGNOSIS — I4892 Unspecified atrial flutter: Secondary | ICD-10-CM | POA: Insufficient documentation

## 2016-08-14 DIAGNOSIS — Z953 Presence of xenogenic heart valve: Secondary | ICD-10-CM

## 2016-08-14 DIAGNOSIS — I483 Typical atrial flutter: Secondary | ICD-10-CM

## 2016-08-14 DIAGNOSIS — Z7982 Long term (current) use of aspirin: Secondary | ICD-10-CM | POA: Insufficient documentation

## 2016-08-14 DIAGNOSIS — Z7984 Long term (current) use of oral hypoglycemic drugs: Secondary | ICD-10-CM | POA: Insufficient documentation

## 2016-08-14 DIAGNOSIS — I5042 Chronic combined systolic (congestive) and diastolic (congestive) heart failure: Secondary | ICD-10-CM

## 2016-08-14 LAB — COMPREHENSIVE METABOLIC PANEL
ALBUMIN: 3.7 g/dL (ref 3.5–5.0)
ALK PHOS: 82 U/L (ref 38–126)
ALT: 26 U/L (ref 17–63)
ANION GAP: 10 (ref 5–15)
AST: 26 U/L (ref 15–41)
BILIRUBIN TOTAL: 0.5 mg/dL (ref 0.3–1.2)
BUN: 36 mg/dL — ABNORMAL HIGH (ref 6–20)
CALCIUM: 9.3 mg/dL (ref 8.9–10.3)
CO2: 28 mmol/L (ref 22–32)
Chloride: 99 mmol/L — ABNORMAL LOW (ref 101–111)
Creatinine, Ser: 1.89 mg/dL — ABNORMAL HIGH (ref 0.61–1.24)
GFR, EST AFRICAN AMERICAN: 40 mL/min — AB (ref >60)
GFR, EST NON AFRICAN AMERICAN: 35 mL/min — AB (ref >60)
GLUCOSE: 155 mg/dL — AB (ref 65–99)
POTASSIUM: 4.8 mmol/L (ref 3.5–5.1)
Sodium: 137 mmol/L (ref 135–145)
TOTAL PROTEIN: 7.1 g/dL (ref 6.5–8.1)

## 2016-08-14 LAB — TSH: TSH: 5.821 u[IU]/mL — AB (ref 0.350–4.500)

## 2016-08-14 LAB — CBC
HCT: 39.2 % (ref 39.0–52.0)
Hemoglobin: 12.3 g/dL — ABNORMAL LOW (ref 13.0–17.0)
MCH: 28 pg (ref 26.0–34.0)
MCHC: 31.4 g/dL (ref 30.0–36.0)
MCV: 89.3 fL (ref 78.0–100.0)
PLATELETS: 240 10*3/uL (ref 150–400)
RBC: 4.39 MIL/uL (ref 4.22–5.81)
RDW: 15.9 % — AB (ref 11.5–15.5)
WBC: 8.8 10*3/uL (ref 4.0–10.5)

## 2016-08-14 LAB — POCT INR: INR: 3.6

## 2016-08-14 LAB — DIGOXIN LEVEL: DIGOXIN LVL: 0.9 ng/mL (ref 0.8–2.0)

## 2016-08-14 MED ORDER — AMIODARONE HCL 200 MG PO TABS: 200.0000 mg | ORAL_TABLET | Freq: Every day | ORAL | 3 refills | Status: DC

## 2016-08-14 MED ORDER — SACUBITRIL-VALSARTAN 49-51 MG PO TABS: 1.0000 | ORAL_TABLET | Freq: Two times a day (BID) | ORAL | 3 refills | Status: DC

## 2016-08-14 NOTE — Patient Instructions (Signed)
Labs today (will call for abnormal results, otherwise no news is good news)  Take Amiodarone at night  Follow up with Dr. Curt Bears  Referral to Cardiac rehab at [facility], they will contact you for appointment.  Follow up in 2 weeks

## 2016-08-14 NOTE — Telephone Encounter (Signed)
Sent message to Melissa tatum to call pt to schedule 1-2 week appt w/ Dr. Curt Bears for A flutter ablation

## 2016-08-15 ENCOUNTER — Encounter (HOSPITAL_COMMUNITY): Payer: Self-pay

## 2016-08-15 ENCOUNTER — Telehealth (HOSPITAL_COMMUNITY): Payer: Self-pay | Admitting: *Deleted

## 2016-08-15 ENCOUNTER — Telehealth (HOSPITAL_COMMUNITY): Payer: Self-pay | Admitting: Pharmacist

## 2016-08-15 MED ORDER — FUROSEMIDE 40 MG PO TABS: 40.0000 mg | ORAL_TABLET | Freq: Every day | ORAL | 3 refills | Status: DC

## 2016-08-15 NOTE — Telephone Encounter (Signed)
Wife called stating that Mr. Carboni continues to have daily lower abdominal pain especially upon standing. They think this has been happening since initiation of amiodarone and has gotten worse over the past week. He is having regular BM's and no recent changes in metformin dosing. Will refer to Dr. Aundra Dubin for further recommendations.   Ruta Hinds. Velva Harman, PharmD, BCPS, CPP Clinical Pharmacist Pager: [phone] Phone: [phone] 08/15/2016 4:22 PM

## 2016-08-15 NOTE — Telephone Encounter (Signed)
Notes Recorded by Harvie Junior, CMA on 08/15/2016 at 4:01 PM EST Patient aware. Medication changes updated in chart and labs added to next office visit.   ------  Notes Recorded by Larey Dresser, MD on 08/15/2016 at 12:17 AM EST Repeat TSH with free T3 and free T4 at next appointment. Needs to decrease Lasix to 40 mg once a day with rise in creatinine (we increased his Entresto today). Stop the supplemental KCl. Please call today.

## 2016-08-15 NOTE — Progress Notes (Signed)
PCP: Dr Hilma Favors Cardiology: Dr. Sallyanne Kuster HF Cardiology: Dr. Aundra Dubin  70 yo with history of CAD s/p CABG in 2007 then redo CABG in 10/17 with mitral valve replacement presents for cardiology followup.  He was admitted in 10/17 for redo CABG with SVG-PDA and SVG-ramus.  He also had mitral valve replacement with a bioprosthetic valve because of infarct-related mitral regurgitation.  His EF on last study (10/17 TEE) was 20-25%.   Post-operative course was complicated by CHF requiring diuresis.  He also had atrial flutter and required DCCV.  Due to complete heart block, he later got a CRT-D system.    He is now back home with his wife in [place].  Today, ECG shows that he is in atrial flutter but rate is controlled.  Per Medtronic interrogation, he has had about 5 days of atrial flutter.   Breathing ok.  No problems walking a short distance.  No orthopnea/PND.  He feels stronger overall.  No chest pain. His main complaint is noticing nausea/abdominal discomfort that may be related to amiodarone use.   Device interrogration: Atrial flutter since 11/8, only 90% BiV pacing  PMH: 1. CAD: CABG 2007.  - LHC (9/17) with patent LIMA-LAD, totally occluded SVG-D, severe stenosis in SVG-PDA.  - Redo CABG 10/17 with SVG-PDA, SVG-ramus and mitral valve replacement.  2. Mitral regurgitation: Ischemic MR, mitral valve replacement was done in 10/17 (bioprosthetic). 3. Complete heart block: Post-op in 10/17.  Medtronic CRT-D placed.  4. Atrial flutter: DCCV 10/17.  5. Type II diabetes 6. Hyperlipidemia 7. Chronic systolic CHF: Ischemic cardiomyopathy.   - TEE (10/17): EF 20-25%, severe LV dilation,  SH: Married, retired, lives in [place], nonsmoker, no ETOH.   Family History  Problem Relation Age of Onset  . Heart attack Mother   . Heart disease Mother   . Heart attack Father   . Heart disease Father   . Hypertension Father   . Hypertension Sister   . Heart disease Maternal Grandmother    ROS: All  systems reviewed and negative except as per HPI.   Current Outpatient Prescriptions  Medication Sig Dispense Refill  . acetaminophen (TYLENOL) 325 MG tablet Take 1-2 tablets (325-650 mg total) by mouth every 4 (four) hours as needed for mild pain.    [name] amiodarone (PACERONE) 200 MG tablet Take 1 tablet (200 mg total) by mouth at bedtime. 30 tablet 3  . aspirin EC 81 MG tablet Take 81 mg by mouth every evening.    . digoxin (LANOXIN) 0.125 MG tablet Take 0.5 tablets (0.0625 mg total) by mouth daily. 30 tablet 1  . furosemide (LASIX) 40 MG tablet Take 1 tablet (40 mg total) by mouth 2 (two) times daily. 180 tablet 3  . glucose blood (ACCU-CHEK AVIVA) test strip Use as instructed bid E11.65 100 each 5  . metFORMIN (GLUCOPHAGE) 500 MG tablet Take 1 tablet (500 mg total) by mouth 2 (two) times daily with a meal. 180 tablet 1  . ondansetron (ZOFRAN-ODT) 4 MG disintegrating tablet Take 1 tablet (4 mg total) by mouth every 8 (eight) hours as needed for nausea or vomiting. 7 tablet 0  . oxyCODONE (OXY IR/ROXICODONE) 5 MG immediate release tablet Take 5 mg by mouth every 4-6 hours PRN severe pain. 28 tablet 0  . potassium chloride (K-DUR,KLOR-CON) 10 MEQ tablet Take 1 tablet (10 mEq total) by mouth daily. 30 tablet 1  . rosuvastatin (CRESTOR) 10 MG tablet Take 1 tablet (10 mg total) by mouth daily. (Patient taking differently: Take  10 mg by mouth every evening. ) 90 tablet 3  . spironolactone (ALDACTONE) 25 MG tablet Take 1 tablet (25 mg total) by mouth daily. 30 tablet 3  . warfarin (COUMADIN) 7.5 MG tablet Take 1 tablet (7.5 mg total) by mouth daily at 6 PM. 30 tablet 3  . sacubitril-valsartan (ENTRESTO) 49-51 MG Take 1 tablet by mouth 2 (two) times daily. 60 tablet 3   No current facility-administered medications for this encounter.    BP (!) 142/64   Pulse 89   Wt 174 lb 12.8 oz (79.3 kg)   SpO2 99%   BMI 23.71 kg/m  General: NAD Neck: No JVD, no thyromegaly or thyroid nodule. Right neck large  lipoma.  Lungs: Clear to auscultation bilaterally with normal respiratory effort. CV: Nondisplaced PMI.  Heart regular S1/S2, no S3/S4, 1/6 SEM RUSB.  No peripheral edema.  No carotid bruit.  Normal pedal pulses.  Abdomen: Soft, nontender, no hepatosplenomegaly, no distention.  Skin: Intact without lesions or rashes.  Neurologic: Alert and oriented x 3.  Psych: Normal affect. Extremities: No clubbing or cyanosis.  HEENT: Normal.   Assessment/Plan: 1. Chronic systolic CHF: Ischemic cardiomyopathy.  TEE 10/17 with EF 20-25%.  Has Medtronic CRT-D system.  On exam today, he is not volume overloaded, NYHA class II-III symptoms.  Atrial flutter impedes BiV pacing (only 90% on device interrogated).  - I think we can decrease Lasix to 40 mg daily. BMET today.  - Increase Entresto to 49/51 bid.  BMET again in 10 days.  - Continue current spironolactone and digoxin.  Check digoxin level.  2. CAD: s/p redo CABG.  No chest pain.  - Continue statin => Crestor 10 mg daily.  - Refer to cardiac rehab in [place].  3. Bioprosthetic mitral valve: Stable on 10/17 TEE. 4. Atrial flutter: Patient is back in atrial flutter today, has been going on x 5 days.  This is decreasing is BiV pacing percentage.  I think he needs to try to get out of atrial flutter.  He is taking amiodarone.  Do not want to increase it given nausea that I think may be related to amiodarone.  - I will make a referral to see Dr. Curt Bears as soon as possible for atrial flutter ablation consideration. If he has successful ablation, hopefully can stop amiodarone.  - Continue warfarin. Check CBC. 5. CKD: Stage III.  Follow closely.   Loralie Champagne 08/15/2016

## 2016-08-15 NOTE — Telephone Encounter (Signed)
We discussed this yesterday.  Can decrease amiodarone to 100 mg daily if symptoms are very significant.

## 2016-08-15 NOTE — Telephone Encounter (Signed)
-----   Message from Larey Dresser, MD sent at 08/15/2016 12:17 AM EST ----- Repeat TSH with free T3 and free T4 at next appointment.  Needs to decrease Lasix to 40 mg once a day with rise in creatinine (we increased his Entresto today).  Stop the supplemental KCl. Please call today.

## 2016-08-15 NOTE — Progress Notes (Signed)
Thanks, Dalton 

## 2016-08-16 ENCOUNTER — Telehealth (HOSPITAL_COMMUNITY): Payer: Self-pay | Admitting: Pharmacist

## 2016-08-16 NOTE — Telephone Encounter (Signed)
Entresto 49-51 mg BID PA approved by [employer] through 10/01/16.   Ruta Hinds. Velva Harman, PharmD, BCPS, CPP Clinical Pharmacist Pager: [phone] Phone: [phone] 08/16/2016 9:26 AM

## 2016-08-16 NOTE — Telephone Encounter (Signed)
Relayed recommendation from Dr. Aundra Dubin. Wife states that they are going to try stopping the Ensure for a couple of days first to see if this helps. I also recommended going to see his PCP if this does not help to resolve the abdominal pain.   Ruta Hinds. Velva Harman, PharmD, BCPS, CPP Clinical Pharmacist Pager: [phone] Phone: [phone] 08/16/2016 11:02 AM

## 2016-08-18 ENCOUNTER — Encounter: Payer: Self-pay | Admitting: Cardiology

## 2016-08-21 ENCOUNTER — Ambulatory Visit (INDEPENDENT_AMBULATORY_CARE_PROVIDER_SITE_OTHER): Payer: Medicare Other | Admitting: Cardiology

## 2016-08-21 ENCOUNTER — Other Ambulatory Visit: Payer: Self-pay | Admitting: Cardiology

## 2016-08-21 ENCOUNTER — Encounter: Payer: Self-pay | Admitting: Cardiology

## 2016-08-21 VITALS — BP 132/84 | HR 66 | Ht 72.0 in | Wt 171.8 lb

## 2016-08-21 DIAGNOSIS — Z01812 Encounter for preprocedural laboratory examination: Secondary | ICD-10-CM

## 2016-08-21 DIAGNOSIS — I2510 Atherosclerotic heart disease of native coronary artery without angina pectoris: Secondary | ICD-10-CM

## 2016-08-21 DIAGNOSIS — I483 Typical atrial flutter: Secondary | ICD-10-CM

## 2016-08-21 DIAGNOSIS — I5042 Chronic combined systolic (congestive) and diastolic (congestive) heart failure: Secondary | ICD-10-CM

## 2016-08-21 DIAGNOSIS — Z95810 Presence of automatic (implantable) cardiac defibrillator: Secondary | ICD-10-CM | POA: Diagnosis not present

## 2016-08-21 LAB — CUP PACEART INCLINIC DEVICE CHECK
Battery Remaining Longevity: 91 mo
Brady Statistic AP VP Percent: 0 %
Brady Statistic AS VS Percent: 7.09 %
Brady Statistic RA Percent Paced: 0 %
Brady Statistic RV Percent Paced: 92.66 %
HIGH POWER IMPEDANCE MEASURED VALUE: 48 Ohm
Implantable Lead Implant Date: 20171024
Implantable Lead Location: 753858
Implantable Lead Location: 753860
Implantable Lead Model: 4598
Implantable Lead Model: 5076
Lead Channel Impedance Value: 180.5 Ohm
Lead Channel Impedance Value: 193.707
Lead Channel Impedance Value: 201.488
Lead Channel Impedance Value: 342 Ohm
Lead Channel Impedance Value: 361 Ohm
Lead Channel Impedance Value: 399 Ohm
Lead Channel Impedance Value: 418 Ohm
Lead Channel Impedance Value: 418 Ohm
Lead Channel Impedance Value: 608 Ohm
Lead Channel Impedance Value: 665 Ohm
Lead Channel Impedance Value: 665 Ohm
Lead Channel Impedance Value: 703 Ohm
Lead Channel Impedance Value: 760 Ohm
Lead Channel Pacing Threshold Amplitude: 0.875 V
Lead Channel Setting Pacing Amplitude: 1.5 V
Lead Channel Setting Sensing Sensitivity: 0.3 mV
MDC IDC LEAD IMPLANT DT: 20171024
MDC IDC LEAD IMPLANT DT: 20171024
MDC IDC LEAD LOCATION: 753859
MDC IDC MSMT BATTERY VOLTAGE: 3.03 V
MDC IDC MSMT LEADCHNL LV IMPEDANCE VALUE: 193.707
MDC IDC MSMT LEADCHNL LV IMPEDANCE VALUE: 218.087
MDC IDC MSMT LEADCHNL LV IMPEDANCE VALUE: 361 Ohm
MDC IDC MSMT LEADCHNL LV IMPEDANCE VALUE: 456 Ohm
MDC IDC MSMT LEADCHNL LV IMPEDANCE VALUE: 608 Ohm
MDC IDC MSMT LEADCHNL LV PACING THRESHOLD PULSEWIDTH: 1 ms
MDC IDC MSMT LEADCHNL RA SENSING INTR AMPL: 2.25 mV
MDC IDC MSMT LEADCHNL RV PACING THRESHOLD AMPLITUDE: 0.625 V
MDC IDC MSMT LEADCHNL RV PACING THRESHOLD PULSEWIDTH: 0.4 ms
MDC IDC MSMT LEADCHNL RV SENSING INTR AMPL: 10 mV
MDC IDC PG IMPLANT DT: 20171024
MDC IDC SESS DTM: 20171120173940
MDC IDC SET LEADCHNL LV PACING PULSEWIDTH: 1 ms
MDC IDC SET LEADCHNL RA PACING AMPLITUDE: 3.5 V
MDC IDC SET LEADCHNL RV PACING AMPLITUDE: 3.5 V
MDC IDC SET LEADCHNL RV PACING PULSEWIDTH: 0.4 ms
MDC IDC STAT BRADY AP VS PERCENT: 0 %
MDC IDC STAT BRADY AS VP PERCENT: 92.91 %

## 2016-08-21 NOTE — Patient Instructions (Addendum)
Medication Instructions:  Your physician recommends that you continue on your current medications as directed. Please refer to the Current Medication list given to you today.   Labwork: BMET / CBC w/ diff / INR    Testing/Procedures: 1) Cardioversion  2) Ablation in January 2018. Someone form our office will call you to schedule.   Follow-Up: Keep appointment with Dr. Curt Bears on 10/26/16   Any Other Special Instructions Will Be Listed Below (If Applicable).   Need therapeutic INR's for 3 weeks prior to cardioversion  Report to the [employer] [facility] at 09/08/16 at 6:30 AM  Do not eat or drink anything after midnight the night prior to your procedure  You may take your medication with a sip of water the morning of procedure           If you need a refill on your cardiac medications before your next appointment, please call your pharmacy.

## 2016-08-21 NOTE — Progress Notes (Signed)
Electrophysiology Office Note   Date:  08/21/2016   ID:  Christian Bowman, DOB 1946/03/27, MRN [id number]  PCP:  Purvis Kilts, MD  Cardiologist:  Aundra Dubin Primary Electrophysiologist:  Yeraldine Forney Meredith Leeds, MD    Chief Complaint  Patient presents with  . Pacemaker Check    Discuss AFlutter ablation     History of Present Illness: Christian Bowman is a 70 y.o. male who presents today for electrophysiology evaluation.   History of CAD s/p CABG in 2007 then redo CABG in 10/17 with mitral valve replacement. He was admitted in 10/17 for redo CABG with SVG-PDA and SVG-ramus.  He also had mitral valve replacement with a bioprosthetic valve because of infarct-related mitral regurgitation.  His EF on last study (10/17 TEE) was 20-25%.   Post-operative course was complicated by CHF requiring diuresis.  He also had atrial flutter and required DCCV.  Due to complete heart block, he later got a CRT-D system.    Today, he denies symptoms of palpitations, chest pain, orthopnea, PND, lower extremity edema, claudication, dizziness, presyncope, syncope, bleeding, or neurologic sequela. The patient is tolerating medications without difficulties. He does say that he has extreme levels of fatigue and shortness of breath. He cannot walk on flat ground more than 15 feet without getting significant shortness of breath. After his hospitalization he was able to do cardiac rehabilitation, but has not been doing any recently since he has gone into atrial flutter.   Past Medical History:  Diagnosis Date  . Anginal pain ([place])   . CAD (coronary artery disease)   . Coronary artery disease involving coronary bypass graft   . Cyst of neck    right side  . DM2 (diabetes mellitus, type 2) ([place]) 08/26/2013  . Dyspnea   . Heart attack   . HTN (hypertension) 08/26/2013  . Hyperlipidemia 08/26/2013  . Left main coronary artery disease   . Left renal artery stenosis ([place])    Genesis 6x12 stent 2007  . Obesity (BMI  30.0-34.9) 08/26/2013  . Postoperative atrial fibrillation ([place]) 10/15/2005  . S/P CABG x 4 10/13/2005   LIMA to LAD, SVG to intermediate branch, sequential SVG to PDA and RPL branch, EVH via right thigh  . S/P mitral valve replacement with bioprosthetic valve 07/11/2016   31 mm [facility] Mitral bovine bioprosthetic tissue valve  . S/P redo CABG x 2 07/11/2016   SVG to PDA and SVG to Intermediate Branch, EVH via left thigh   Past Surgical History:  Procedure Laterality Date  . CARDIAC CATHETERIZATION N/A 06/21/2016   Procedure: Right/Left Heart Cath and Coronary/Graft Angiography;  Surgeon: Sherren Mocha, MD;  Location: [facility];  Service: Cardiovascular;  Laterality: N/A;  . CARDIOVERSION N/A 07/19/2016   Procedure: CARDIOVERSION;  Surgeon: Lelon Perla, MD;  Location: [facility] ENDOSCOPY;  Service: Cardiovascular;  Laterality: N/A;  . CORONARY ARTERY BYPASS GRAFT  10/13/2005   LIMA to LAD, SVG to intermediate branch, sequential SVG to PDA and RPL  . CORONARY ARTERY BYPASS GRAFT N/A 07/11/2016   Procedure: REDO CORONARY ARTERY BYPASS GRAFTING (CABG) x two using left leg greater saphenous vein harvested endoscopically-SVG to PDA -SVG to RAMUS INTERMEDIATE;  Surgeon: Rexene Alberts, MD;  Location: [place];  Service: Open Heart Surgery;  Laterality: N/A;  . CORONARY ARTERY BYPASS GRAFT N/A 07/11/2016   Procedure: Re-exploration (CABG) for post op bleeding,;  Surgeon: Rexene Alberts, MD;  Location: [place];  Service: Open Heart Surgery;  Laterality: N/A;  .  EP IMPLANTABLE DEVICE N/A 07/25/2016   Procedure: BiV ICD Insertion CRT-D;  Surgeon: Jalayiah Bibian Meredith Leeds, MD;  Location: [facility];  Service: Cardiovascular;  Laterality: N/A;  . MITRAL VALVE REPLACEMENT N/A 07/11/2016   Procedure: MITRAL VALVE (MV) REPLACEMENT;  Surgeon: Rexene Alberts, MD;  Location: [place];  Service: Open Heart Surgery;  Laterality: N/A;  . MYOCARDICAL PERFUSION  10/09/2007   NORMAL PERFUSION IN ALL  REGIONS;NO EVIDENCE OF INDUCIBLE ISCHEMIA;POST STRESS EF% 66  . PERCUTANEOUS CORONARY STENT INTERVENTION (PCI-S)     DES in SVG to right coronary artery system  . RENAL ARTERY STENT Right 2007  . RENAL DOPPLER  03/28/2010   RIGHT RA-NORMAL;LEFT PROXIMAL RA AT STENT-PATENT WITH NO EVIDENCE OF SIGN DIAMETER REDUCTION. R & L KIDNEYS: EQUAL IN SIZE,SYMMETRICAL IN SHAPE.  . TEE WITHOUT CARDIOVERSION N/A 06/15/2016   Procedure: TRANSESOPHAGEAL ECHOCARDIOGRAM (TEE);  Surgeon: Sanda Klein, MD;  Location: [facility] ENDOSCOPY;  Service: Cardiovascular;  Laterality: N/A;  . TEE WITHOUT CARDIOVERSION N/A 07/11/2016   Procedure: TRANSESOPHAGEAL ECHOCARDIOGRAM (TEE);  Surgeon: Rexene Alberts, MD;  Location: [place];  Service: Open Heart Surgery;  Laterality: N/A;  . TEE WITHOUT CARDIOVERSION N/A 07/19/2016   Procedure: TRANSESOPHAGEAL ECHOCARDIOGRAM (TEE);  Surgeon: Lelon Perla, MD;  Location: [facility] ENDOSCOPY;  Service: Cardiovascular;  Laterality: N/A;  . TRANSESOPHAGEAL ECHOCARDIOGRAM  10/19/2005   NORMAL LV; MILD TO MODERATE AMOUNT OF SOFT ATHEROMATOUS PLAQUE OF THE THORACIC AORTA; THE LEFT ATRIUM IS MILDLY DILATED;LEFT ATRIAL APPENDAGE FUNCTION IS NORMAL;NO THROMBUS IDENTIFIED. SMALL PFO WITH RIGHT TO LEFT SHUNT     Current Outpatient Prescriptions  Medication Sig Dispense Refill  . acetaminophen (TYLENOL) 325 MG tablet Take 1-2 tablets (325-650 mg total) by mouth every 4 (four) hours as needed for mild pain.    [name] amiodarone (PACERONE) 200 MG tablet Take 1 tablet (200 mg total) by mouth at bedtime. 30 tablet 3  . aspirin EC 81 MG tablet Take 81 mg by mouth every evening.    . digoxin (LANOXIN) 0.125 MG tablet Take 0.5 tablets (0.0625 mg total) by mouth daily. 30 tablet 1  . furosemide (LASIX) 40 MG tablet Take 1 tablet (40 mg total) by mouth daily. 90 tablet 3  . glucose blood (ACCU-CHEK AVIVA) test strip Use as instructed bid E11.65 100 each 5  . metFORMIN (GLUCOPHAGE) 500 MG tablet Take 1 tablet (500 mg  total) by mouth 2 (two) times daily with a meal. 180 tablet 1  . ondansetron (ZOFRAN-ODT) 4 MG disintegrating tablet Take 1 tablet (4 mg total) by mouth every 8 (eight) hours as needed for nausea or vomiting. 7 tablet 0  . oxyCODONE (OXY IR/ROXICODONE) 5 MG immediate release tablet Take 5 mg by mouth every 4-6 hours PRN severe pain. 28 tablet 0  . rosuvastatin (CRESTOR) 10 MG tablet Take 1 tablet (10 mg total) by mouth daily. (Patient taking differently: Take 10 mg by mouth every evening. ) 90 tablet 3  . sacubitril-valsartan (ENTRESTO) 49-51 MG Take 1 tablet by mouth 2 (two) times daily. 60 tablet 3  . spironolactone (ALDACTONE) 25 MG tablet Take 1 tablet (25 mg total) by mouth daily. 30 tablet 3  . warfarin (COUMADIN) 7.5 MG tablet Take 1 tablet (7.5 mg total) by mouth daily at 6 PM. 30 tablet 3   No current facility-administered medications for this visit.     Allergies:   Xanax [alprazolam]   Social History:  The patient  reports that he has never smoked. He has never  used smokeless tobacco. He reports that he does not drink alcohol or use drugs.   Family History:  The patient's family history includes Heart attack in his father and mother; Heart disease in his father, maternal grandmother, and mother; Hypertension in his father and sister.    ROS:  Please see the history of present illness.   Otherwise, review of systems is positive for DOE, fatigue, palpitations.   All other systems are reviewed and negative.    PHYSICAL EXAM: VS:  There were no vitals taken for this visit. , BMI There is no height or weight on file to calculate BMI. GEN: Well nourished, well developed, in no acute distress  HEENT: normal  Neck: no JVD, carotid bruits, or masses Cardiac: RRR; no murmurs, rubs, or gallops,no edema  Respiratory:  clear to auscultation bilaterally, normal work of breathing GI: soft, nontender, nondistended, + BS MS: no deformity or atrophy  Skin: warm and dry,  device pocket is well  healed Neuro:  Strength and sensation are intact Psych: euthymic mood, full affect  EKG:  EKG is ordered today. Personal review of the ekg ordered shows atrial flutter, BiV paced   Device interrogation is reviewed today in detail.  See PaceArt for details.   Recent Labs: 07/17/2016: Magnesium 2.1 08/14/2016: ALT 26; BUN 36; Creatinine, Ser 1.89; Hemoglobin 12.3; Platelets 240; Potassium 4.8; Sodium 137; TSH 5.821    Lipid Panel     Component Value Date/Time   CHOL 95 (L) 04/20/2016 0805   TRIG 99 07/17/2016 0335   HDL 28 (L) 04/20/2016 0805   CHOLHDL 3.4 04/20/2016 0805   LDLCALC 45 04/20/2016 0805     Wt Readings from Last 3 Encounters:  08/14/16 174 lb 12.8 oz (79.3 kg)  08/01/16 186 lb 14.4 oz (84.8 kg)  07/10/16 180 lb (81.6 kg)      Other studies Reviewed: Additional studies/ records that were reviewed today include: TEE 07/19/16  Review of the above records today demonstrates:  - Left ventricle: The cavity size was severely dilated. Systolic   function was severely reduced. The estimated ejection fraction   was in the range of 20% to 25%. Diffuse hypokinesis. - Aortic valve: No evidence of vegetation. - Mitral valve: A bioprosthesis was present. - Left atrium: The atrium was moderately dilated. No evidence of   thrombus in the atrial cavity or appendage. - Right ventricle: Systolic function was moderately reduced. - Right atrium: No evidence of thrombus in the atrial cavity or   appendage. - Atrial septum: No defect or patent foramen ovale was identified. - Tricuspid valve: No evidence of vegetation. There was   mild-moderate regurgitation. - Pulmonic valve: No evidence of vegetation.   ASSESSMENT AND PLAN:  1.  Atrial flutter: I discussed with him the options of therapy for atrial flutter including ablation. Risks and benefits of ablation were discussed. Risks include bleeding, tamponade, heart block, and stroke. He understands these risks and has agreed  to the procedure. He has recently had a CRT D placed, and therefore would prefer to allow his leads to heal into place prior to ablation. We'll plan for cardioversion in the interim. He has had 2 weeks of therapeutic INRs with a recheck this coming Wednesday. We'll plan for cardioversion the first week of December.  This patients CHA2DS2-VASc Score and unadjusted Ischemic Stroke Rate (% per year) is equal to 4.8 % stroke rate/year from a score of 4  Above score calculated as 1 point each if present [CHF, HTN,  DM, Vascular=MI/PAD/Aortic Plaque, Age if 32-74, or Male] Above score calculated as 2 points each if present [Age > 75, or Stroke/TIA/TE]  2. Complete AV block: s/p CRT-D  3. Chronic systolic heart failure: on optimal medical therapy  4. Mitral regurgitation: s/p redo MVR  5. CAD: s/p redo CABG. No recurrent chest pain     Current medicines are reviewed at length with the patient today.   The patient does not have concerns regarding his medicines.  The following changes were made today:  none  Labs/ tests ordered today include:  No orders of the defined types were placed in this encounter.    Disposition:   FU with Kees Idrovo 2 months  Signed, Nanako Stopher Meredith Leeds, MD  08/21/2016 1:43 PM     [address] [place] [address] [phone] (office) [phone] (fax)

## 2016-08-22 ENCOUNTER — Other Ambulatory Visit: Payer: Self-pay | Admitting: Thoracic Surgery (Cardiothoracic Vascular Surgery)

## 2016-08-22 DIAGNOSIS — Z951 Presence of aortocoronary bypass graft: Secondary | ICD-10-CM

## 2016-08-23 ENCOUNTER — Ambulatory Visit (INDEPENDENT_AMBULATORY_CARE_PROVIDER_SITE_OTHER): Payer: Medicare Other | Admitting: *Deleted

## 2016-08-23 DIAGNOSIS — Z953 Presence of xenogenic heart valve: Secondary | ICD-10-CM | POA: Diagnosis not present

## 2016-08-23 DIAGNOSIS — I2510 Atherosclerotic heart disease of native coronary artery without angina pectoris: Secondary | ICD-10-CM | POA: Diagnosis not present

## 2016-08-23 DIAGNOSIS — Z5181 Encounter for therapeutic drug level monitoring: Secondary | ICD-10-CM | POA: Diagnosis not present

## 2016-08-23 DIAGNOSIS — I483 Typical atrial flutter: Secondary | ICD-10-CM

## 2016-08-23 LAB — POCT INR: INR: 3.4

## 2016-08-28 ENCOUNTER — Ambulatory Visit
Admission: RE | Admit: 2016-08-28 | Discharge: 2016-08-28 | Disposition: A | Discharge status: Home / Self Care | Payer: Medicare Other | Source: Ambulatory Visit | Attending: Thoracic Surgery (Cardiothoracic Vascular Surgery) | Admitting: Thoracic Surgery (Cardiothoracic Vascular Surgery)

## 2016-08-28 ENCOUNTER — Encounter (HOSPITAL_COMMUNITY): Payer: Self-pay

## 2016-08-28 ENCOUNTER — Encounter: Payer: Self-pay | Admitting: Thoracic Surgery (Cardiothoracic Vascular Surgery)

## 2016-08-28 ENCOUNTER — Other Ambulatory Visit: Payer: Self-pay | Admitting: Cardiovascular Disease

## 2016-08-28 ENCOUNTER — Ambulatory Visit (HOSPITAL_COMMUNITY)
Admission: RE | Admit: 2016-08-28 | Discharge: 2016-08-28 | Disposition: A | Discharge status: Home / Self Care | Payer: Medicare Other | Source: Ambulatory Visit | Attending: Internal Medicine | Admitting: Internal Medicine

## 2016-08-28 ENCOUNTER — Ambulatory Visit (INDEPENDENT_AMBULATORY_CARE_PROVIDER_SITE_OTHER): Payer: Self-pay | Admitting: Thoracic Surgery (Cardiothoracic Vascular Surgery)

## 2016-08-28 VITALS — BP 126/74 | HR 85 | Wt 169.4 lb

## 2016-08-28 VITALS — BP 138/87 | HR 76 | Resp 16 | Ht 72.0 in | Wt 164.0 lb

## 2016-08-28 DIAGNOSIS — Z7982 Long term (current) use of aspirin: Secondary | ICD-10-CM | POA: Insufficient documentation

## 2016-08-28 DIAGNOSIS — R5383 Other fatigue: Secondary | ICD-10-CM | POA: Diagnosis not present

## 2016-08-28 DIAGNOSIS — Z7984 Long term (current) use of oral hypoglycemic drugs: Secondary | ICD-10-CM | POA: Insufficient documentation

## 2016-08-28 DIAGNOSIS — Z951 Presence of aortocoronary bypass graft: Secondary | ICD-10-CM | POA: Diagnosis not present

## 2016-08-28 DIAGNOSIS — I483 Typical atrial flutter: Secondary | ICD-10-CM | POA: Diagnosis not present

## 2016-08-28 DIAGNOSIS — N183 Chronic kidney disease, stage 3 (moderate): Secondary | ICD-10-CM | POA: Diagnosis not present

## 2016-08-28 DIAGNOSIS — I5022 Chronic systolic (congestive) heart failure: Secondary | ICD-10-CM | POA: Insufficient documentation

## 2016-08-28 DIAGNOSIS — Z5189 Encounter for other specified aftercare: Secondary | ICD-10-CM | POA: Diagnosis not present

## 2016-08-28 DIAGNOSIS — I2510 Atherosclerotic heart disease of native coronary artery without angina pectoris: Secondary | ICD-10-CM | POA: Insufficient documentation

## 2016-08-28 DIAGNOSIS — I5042 Chronic combined systolic (congestive) and diastolic (congestive) heart failure: Secondary | ICD-10-CM

## 2016-08-28 DIAGNOSIS — Z952 Presence of prosthetic heart valve: Secondary | ICD-10-CM | POA: Insufficient documentation

## 2016-08-28 DIAGNOSIS — E1122 Type 2 diabetes mellitus with diabetic chronic kidney disease: Secondary | ICD-10-CM | POA: Diagnosis not present

## 2016-08-28 DIAGNOSIS — R0602 Shortness of breath: Secondary | ICD-10-CM | POA: Diagnosis not present

## 2016-08-28 DIAGNOSIS — I340 Nonrheumatic mitral (valve) insufficiency: Secondary | ICD-10-CM

## 2016-08-28 DIAGNOSIS — Z953 Presence of xenogenic heart valve: Secondary | ICD-10-CM

## 2016-08-28 DIAGNOSIS — I5023 Acute on chronic systolic (congestive) heart failure: Secondary | ICD-10-CM | POA: Diagnosis not present

## 2016-08-28 DIAGNOSIS — I255 Ischemic cardiomyopathy: Secondary | ICD-10-CM | POA: Insufficient documentation

## 2016-08-28 DIAGNOSIS — Z79899 Other long term (current) drug therapy: Secondary | ICD-10-CM | POA: Diagnosis not present

## 2016-08-28 LAB — COMPREHENSIVE METABOLIC PANEL
ALK PHOS: 81 U/L (ref 38–126)
ALT: 24 U/L (ref 17–63)
AST: 20 U/L (ref 15–41)
Albumin: 3.8 g/dL (ref 3.5–5.0)
Anion gap: 6 (ref 5–15)
BILIRUBIN TOTAL: 0.5 mg/dL (ref 0.3–1.2)
BUN: 28 mg/dL — AB (ref 6–20)
CALCIUM: 9.3 mg/dL (ref 8.9–10.3)
CO2: 27 mmol/L (ref 22–32)
CREATININE: 1.55 mg/dL — AB (ref 0.61–1.24)
Chloride: 105 mmol/L (ref 101–111)
GFR, EST AFRICAN AMERICAN: 51 mL/min — AB (ref >60)
GFR, EST NON AFRICAN AMERICAN: 44 mL/min — AB (ref >60)
Glucose, Bld: 130 mg/dL — ABNORMAL HIGH (ref 65–99)
Potassium: 4.3 mmol/L (ref 3.5–5.1)
Sodium: 138 mmol/L (ref 135–145)
TOTAL PROTEIN: 7.5 g/dL (ref 6.5–8.1)

## 2016-08-28 LAB — DIGOXIN LEVEL: DIGOXIN LVL: 0.5 ng/mL — AB (ref 0.8–2.0)

## 2016-08-28 LAB — BRAIN NATRIURETIC PEPTIDE: B NATRIURETIC PEPTIDE 5: 1608.1 pg/mL — AB (ref 0.0–100.0)

## 2016-08-28 LAB — TSH: TSH: 8.235 u[IU]/mL — ABNORMAL HIGH (ref 0.350–4.500)

## 2016-08-28 LAB — T4, FREE: Free T4: 1.11 ng/dL (ref 0.61–1.12)

## 2016-08-28 MED ORDER — CARVEDILOL 3.125 MG PO TABS: 3.1250 mg | ORAL_TABLET | Freq: Two times a day (BID) | ORAL | 3 refills | Status: DC

## 2016-08-28 NOTE — Progress Notes (Signed)
Thank you, Cub

## 2016-08-28 NOTE — Progress Notes (Addendum)
[address]       [place] [address]             [phone]     CARDIOTHORACIC SURGERY OFFICE NOTE  Referring Provider is Croitoru, Dani Gobble, MD PCP is Purvis Kilts, MD   HPI:  Patient is a 70 year old male with long-standing history of coronary artery disease, severe ischemic cardiomyopathy with chronic combined systolic and diastolic congestive heart failure, mitral regurgitation, hypertension, type 2 diabetes mellitus, and hyperlipidemia who returns to the office today for routine follow-up status post redo coronary artery bypass grafting 2 and mitral valve replacement using a bioprosthetic tissue valve on 07/11/2016.  The patient's early postoperative recovery was notable for severe left ventricular systolic dysfunction requiring very slow gradual weaning of inotropic support. He also was noted to have AV block and the development of atrial flutter requiring DC cardioversion. He ultimately underwent placement of a biventricular pacemaker-ICD by Dr. Curt Bears 07/25/2016 and was eventually discharged home on 08/01/2016. Since hospital discharge she has been seen in follow-up in the advanced heart failure clinic and by Dr. Curt Bears in the EP clinic.  He was noted to have gone back into atrial flutter and he has tentatively been scheduled for cardioversion next week. He returns to our office today reporting that he has made very slow progress. States that he still gets short of breath quite easily with activity. He denies any orthopnea or lower extremity edema. He has not had any chest pain or chest tightness. He has had minimal soreness in his chest and he has not been taking any pain medicines. He has not had any palpitations, dizzy spells, or syncope.  Current Outpatient Prescriptions  Medication Sig Dispense Refill  . acetaminophen (TYLENOL) 325 MG tablet Take 1-2 tablets (325-650 mg total) by mouth every 4 (four) hours as needed for mild pain.    [name] amiodarone  (PACERONE) 200 MG tablet Take 1 tablet (200 mg total) by mouth at bedtime. 30 tablet 3  . aspirin EC 81 MG tablet Take 81 mg by mouth every evening.    . digoxin (LANOXIN) 0.125 MG tablet Take 0.5 tablets (0.0625 mg total) by mouth daily. 30 tablet 1  . furosemide (LASIX) 40 MG tablet Take 1 tablet (40 mg total) by mouth daily. 90 tablet 3  . glucose blood (ACCU-CHEK AVIVA) test strip Use as instructed bid E11.65 100 each 5  . metFORMIN (GLUCOPHAGE) 500 MG tablet Take 1 tablet (500 mg total) by mouth 2 (two) times daily with a meal. 180 tablet 1  . ondansetron (ZOFRAN-ODT) 4 MG disintegrating tablet Take 1 tablet (4 mg total) by mouth every 8 (eight) hours as needed for nausea or vomiting. 7 tablet 0  . rosuvastatin (CRESTOR) 10 MG tablet TAKE ONE TABLET BY MOUTH ONCE DAILY 90 tablet 0  . sacubitril-valsartan (ENTRESTO) 49-51 MG Take 1 tablet by mouth 2 (two) times daily. 60 tablet 3  . spironolactone (ALDACTONE) 25 MG tablet Take 1 tablet (25 mg total) by mouth daily. 30 tablet 3  . warfarin (COUMADIN) 7.5 MG tablet Take 1 tablet (7.5 mg total) by mouth daily at 6 PM. 30 tablet 3  . oxyCODONE (OXY IR/ROXICODONE) 5 MG immediate release tablet Take 5 mg by mouth every 4-6 hours PRN severe pain. (Patient not taking: Reported on 08/28/2016) 28 tablet 0   No current facility-administered medications for this visit.       Physical Exam:   BP 138/87 (BP Location: Right Arm,  Patient Position: Sitting, Cuff Size: Large)   Pulse 76   Resp 16   Ht 6' (1.829 m)   Wt 164 lb (74.4 kg)   SpO2 96% Comment: ON RA  BMI 22.24 kg/m   General:  Well-appearing  Chest:   Clear to auscultation with symmetrical breath sounds  CV:   Regular rate and rhythm without murmur  Incisions:  Healing nicely, sternum is stable  Abdomen:  Soft nontender  Extremities:  Warm and well-perfused, no lower extremity edema  Diagnostic Tests:  CHEST  2 VIEW  COMPARISON:  07/28/2016  FINDINGS: Sequelae of prior CABG  and mitral valve replacement are again identified. An ICD remains in place with right atrial, right ventricular, and coronary sinus leads which are unchanged. The cardiac silhouette remains mildly enlarged. Small pleural effusions on the prior study have resolved. No airspace consolidation, edema, or pneumothorax is seen. No acute osseous abnormality is identified.  IMPRESSION: No active cardiopulmonary disease.  Resolved pleural effusions.   Electronically Signed   By: [place] Bores M.D.   On: 08/28/2016 13:28   Impression:  Patient remains clinically stable approximately 6 weeks status post redo coronary artery bypass grafting and mitral valve replacement using a bioprosthetic tissue valve.  He remains significantly limited by symptoms of exertional shortness of breath, but at present he appears euvolemic if not dry on exam. By report he remains in atrial flutter and is planned for DC cardioversion next week.   Plan:  I have encouraged the patient to continue to gradually increase his physical activity as tolerated with his primary limitation remaining that he refrain from heavy lifting or strenuous use of his arms or shoulders for at least another 6-8 weeks. From a surgical standpoint I think it would be reasonable for him to resume driving an automobile and enrolling in the outpatient cardiac rehabilitation program. However, I will defer to members of the advanced heart failure team as to whether not he is ready for either at this time. We have not recommended any changes to the patient's current medications.  The patient has been reminded regarding the importance of dental hygiene and the lifelong need for antibiotic prophylaxis for all dental cleanings and other related invasive procedures.  The patient will continue to follow-up closely in the advanced heart failure clinic, with Dr. Sallyanne Kuster at [facility], and the EP clinic. We will plan to see him back next October around the 1  year anniversary of his surgery. He will call and return sooner should specific problems or questions arise.    Valentina Gu. Roxy Manns, MD 08/28/2016 1:25 PM

## 2016-08-28 NOTE — Telephone Encounter (Signed)
REFILL 

## 2016-08-28 NOTE — Patient Instructions (Addendum)
Continue all previous medications without any changes at this time  Continue to avoid any heavy lifting or strenuous use of your arms or shoulders for at least a total of three months from the time of surgery.  After three months you may gradually increase how much you lift or otherwise use your arms or chest as tolerated, with limits based upon whether or not activities lead to the return of significant discomfort.  You may return to driving an automobile once cleared by Dr. Aundra Dubin.  It would be wise to start driving only short distances during the daylight and gradually increase from there as you feel comfortable.  You are encouraged to enroll and participate in the outpatient cardiac rehab program beginning as soon as practical once cleared by Dr. Aundra Dubin.  Endocarditis is a potentially serious infection of heart valves or inside lining of the heart.  It occurs more commonly in patients with diseased heart valves (such as patient's with aortic or mitral valve disease) and in patients who have undergone heart valve repair or replacement.  Certain surgical and dental procedures may put you at risk, such as dental cleaning, other dental procedures, or any surgery involving the respiratory, urinary, gastrointestinal tract, gallbladder or prostate gland.   To minimize your chances for develooping endocarditis, maintain good oral health and seek prompt medical attention for any infections involving the mouth, teeth, gums, skin or urinary tract.    Always notify your doctor or dentist about your underlying heart valve condition before having any invasive procedures. You will need to take antibiotics before certain procedures, including all routine dental cleanings or other dental procedures.  Your cardiologist or dentist should prescribe these antibiotics for you to be taken ahead of time.

## 2016-08-28 NOTE — Patient Instructions (Signed)
Labs today (will call for abnormal results, otherwise no news is good news)  Start Coreg 3.125 mg (1 Tab) Two times Daily  Cardiac Rehab has been ordered for you, they will contact you.  Your provider clears you to drive locally.  Follow up in 3 weeks

## 2016-08-29 LAB — T3, FREE: T3 FREE: 1.9 pg/mL — AB (ref 2.0–4.4)

## 2016-08-29 NOTE — Progress Notes (Signed)
PCP: Dr Hilma Favors Cardiology: Dr. Sallyanne Kuster HF Cardiology: Dr. Aundra Dubin  70 yo with history of CAD s/p CABG in 2007 then redo CABG in 10/17 with mitral valve replacement presents for cardiology followup.  He was admitted in 10/17 for redo CABG with SVG-PDA and SVG-ramus.  He also had mitral valve replacement with a bioprosthetic valve because of infarct-related mitral regurgitation.  His EF on last study (10/17 TEE) was 20-25%.   Post-operative course was complicated by CHF requiring diuresis.  He also had atrial flutter and required DCCV.  Due to complete heart block, he later got a CRT-D system.    He is now back home with his wife in [place].  At last visit, he was in atrial flutter.  He saw Dr. Curt Bears, it was decided to arrange for DCCV with ablation down the road when PPM leads have been in longer.  He remains in rate-controlled atrial flutter today.    Breathing varies.  On some days, he feels quite good, on others not good.  Today is a good day.  No dyspnea walking on flat ground today.  No orthopnea/PND.  Nausea better now that he is taking amiodarone at night.  Weight down 5 lbs.  Evening Lasix was stopped due to elevated creatinine, now just taking 40 mg daily.  CXR today showed resolution of pleural effusion.  No chest pain.  He does not feel palpitations.   ECG: atrial flutter, BiV paced  Labs (11/17): K 4.8, creatinine 1.89, hgb 12.3, digoxin 0.9, LFTs normal  PMH: 1. CAD: CABG 2007.  - LHC (9/17) with patent LIMA-LAD, totally occluded SVG-D, severe stenosis in SVG-PDA.  - Redo CABG 10/17 with SVG-PDA, SVG-ramus and mitral valve replacement.  2. Mitral regurgitation: Ischemic MR, mitral valve replacement was done in 10/17 (bioprosthetic). 3. Complete heart block: Post-op in 10/17.  Medtronic CRT-D placed.  4. Atrial flutter: DCCV 10/17.  5. Type II diabetes 6. Hyperlipidemia 7. Chronic systolic CHF: Ischemic cardiomyopathy.   - TEE (10/17): EF 20-25%, severe LV dilation,  SH:  Married, retired, lives in [place], nonsmoker, no ETOH.   Family History  Problem Relation Age of Onset  . Heart attack Mother   . Heart disease Mother   . Heart attack Father   . Heart disease Father   . Hypertension Father   . Hypertension Sister   . Heart disease Maternal Grandmother    ROS: All systems reviewed and negative except as per HPI.   Current Outpatient Prescriptions  Medication Sig Dispense Refill  . acetaminophen (TYLENOL) 325 MG tablet Take 1-2 tablets (325-650 mg total) by mouth every 4 (four) hours as needed for mild pain.    [name] amiodarone (PACERONE) 200 MG tablet Take 1 tablet (200 mg total) by mouth at bedtime. 30 tablet 3  . aspirin EC 81 MG tablet Take 81 mg by mouth every evening.    . digoxin (LANOXIN) 0.125 MG tablet Take 0.5 tablets (0.0625 mg total) by mouth daily. 30 tablet 1  . furosemide (LASIX) 40 MG tablet Take 1 tablet (40 mg total) by mouth daily. 90 tablet 3  . glucose blood (ACCU-CHEK AVIVA) test strip Use as instructed bid E11.65 100 each 5  . metFORMIN (GLUCOPHAGE) 500 MG tablet Take 1 tablet (500 mg total) by mouth 2 (two) times daily with a meal. 180 tablet 1  . ondansetron (ZOFRAN-ODT) 4 MG disintegrating tablet Take 1 tablet (4 mg total) by mouth every 8 (eight) hours as needed for nausea or vomiting. 7 tablet  0  . oxyCODONE (OXY IR/ROXICODONE) 5 MG immediate release tablet Take 5 mg by mouth every 4-6 hours PRN severe pain. 28 tablet 0  . rosuvastatin (CRESTOR) 10 MG tablet TAKE ONE TABLET BY MOUTH ONCE DAILY 90 tablet 0  . sacubitril-valsartan (ENTRESTO) 49-51 MG Take 1 tablet by mouth 2 (two) times daily. 60 tablet 3  . spironolactone (ALDACTONE) 25 MG tablet Take 1 tablet (25 mg total) by mouth daily. 30 tablet 3  . warfarin (COUMADIN) 7.5 MG tablet Take 1 tablet (7.5 mg total) by mouth daily at 6 PM. 30 tablet 3  . carvedilol (COREG) 3.125 MG tablet Take 1 tablet (3.125 mg total) by mouth 2 (two) times daily. 60 tablet 3   No current  facility-administered medications for this encounter.    BP 126/74   Pulse 85   Wt 169 lb 6.4 oz (76.8 kg)   SpO2 100%   BMI 22.97 kg/m  General: NAD Neck: No JVD, no thyromegaly or thyroid nodule. Right neck large lipoma.  Lungs: Clear to auscultation bilaterally with normal respiratory effort. CV: Nondisplaced PMI.  Heart regular S1/S2, no S3/S4, 1/6 SEM RUSB.  No peripheral edema.  No carotid bruit.  Normal pedal pulses.  Abdomen: Soft, nontender, no hepatosplenomegaly, no distention.  Skin: Intact without lesions or rashes.  Neurologic: Alert and oriented x 3.  Psych: Normal affect. Extremities: No clubbing or cyanosis.  HEENT: Normal.   Assessment/Plan: 1. Chronic systolic CHF: Ischemic cardiomyopathy.  TEE 10/17 with EF 20-25%.  Has Medtronic CRT-D system.  On exam today, he is not volume overloaded, NYHA class II-III symptoms.  Atrial flutter has impeded ideal BiV pacing.   - Continue Lasix 40 mg daily, BMET/BNP today.   - Continue Entresto 49/51 bid.   - Continue current spironolactone and digoxin.  Check digoxin level.  - Add Coreg 3.125 mg bid today.  2. CAD: s/p redo CABG.  No chest pain.  - Continue statin => Crestor 10 mg daily.  - Refer to cardiac rehab in [place] => has not heard from them yet.  3. Bioprosthetic mitral valve: Stable on 10/17 TEE. 4. Atrial flutter: Patient remains in atrial flutter.  This is decreasing is BiV pacing percentage.  I think he needs to try to get out of atrial flutter.   - He is taking amiodarone, tolerating this better with evening dosing. Recent LFTs normal.  TSH was high => will recheck TSH along with free T3 and free T4.  Will need regular eye exam.  - Saw Dr. Curt Bears, plan for now is DCCV after 3 weeks of therapeutic INR.  Will then have eventual atrial flutter ablation after his device leads have been in for longer.   - Continue warfarin. Will have weekly INRs until DCCV.  5. CKD: Stage III.  BMET today.   Followup with me in 3 wks.     Loralie Champagne 08/29/2016

## 2016-08-30 ENCOUNTER — Ambulatory Visit (INDEPENDENT_AMBULATORY_CARE_PROVIDER_SITE_OTHER): Payer: Medicare Other | Admitting: *Deleted

## 2016-08-30 DIAGNOSIS — Z953 Presence of xenogenic heart valve: Secondary | ICD-10-CM

## 2016-08-30 DIAGNOSIS — Z5181 Encounter for therapeutic drug level monitoring: Secondary | ICD-10-CM

## 2016-08-30 DIAGNOSIS — I483 Typical atrial flutter: Secondary | ICD-10-CM

## 2016-08-30 DIAGNOSIS — I2510 Atherosclerotic heart disease of native coronary artery without angina pectoris: Secondary | ICD-10-CM | POA: Diagnosis not present

## 2016-08-30 LAB — POCT INR: INR: 3.9

## 2016-09-04 ENCOUNTER — Ambulatory Visit (INDEPENDENT_AMBULATORY_CARE_PROVIDER_SITE_OTHER): Payer: Medicare Other | Admitting: *Deleted

## 2016-09-04 ENCOUNTER — Other Ambulatory Visit (HOSPITAL_COMMUNITY)
Admission: RE | Admit: 2016-09-04 | Discharge: 2016-09-04 | Disposition: A | Discharge status: Home / Self Care | Payer: Medicare Other | Source: Ambulatory Visit | Attending: Cardiology | Admitting: Cardiology

## 2016-09-04 DIAGNOSIS — I2510 Atherosclerotic heart disease of native coronary artery without angina pectoris: Secondary | ICD-10-CM

## 2016-09-04 DIAGNOSIS — I483 Typical atrial flutter: Secondary | ICD-10-CM

## 2016-09-04 DIAGNOSIS — Z5181 Encounter for therapeutic drug level monitoring: Secondary | ICD-10-CM | POA: Diagnosis not present

## 2016-09-04 DIAGNOSIS — Z01812 Encounter for preprocedural laboratory examination: Secondary | ICD-10-CM | POA: Insufficient documentation

## 2016-09-04 DIAGNOSIS — Z953 Presence of xenogenic heart valve: Secondary | ICD-10-CM

## 2016-09-04 LAB — CBC WITH DIFFERENTIAL/PLATELET
Basophils Absolute: 0 10*3/uL (ref 0.0–0.1)
Basophils Relative: 0 %
Eosinophils Absolute: 0.4 10*3/uL (ref 0.0–0.7)
Eosinophils Relative: 5 %
HEMATOCRIT: 42.4 % (ref 39.0–52.0)
HEMOGLOBIN: 13.6 g/dL (ref 13.0–17.0)
LYMPHS ABS: 1.3 10*3/uL (ref 0.7–4.0)
LYMPHS PCT: 17 %
MCH: 28.5 pg (ref 26.0–34.0)
MCHC: 32.1 g/dL (ref 30.0–36.0)
MCV: 88.7 fL (ref 78.0–100.0)
MONO ABS: 0.6 10*3/uL (ref 0.1–1.0)
MONOS PCT: 8 %
NEUTROS ABS: 5.7 10*3/uL (ref 1.7–7.7)
NEUTROS PCT: 70 %
Platelets: 167 10*3/uL (ref 150–400)
RBC: 4.78 MIL/uL (ref 4.22–5.81)
RDW: 15.5 % (ref 11.5–15.5)
WBC: 8.1 10*3/uL (ref 4.0–10.5)

## 2016-09-04 LAB — BASIC METABOLIC PANEL
ANION GAP: 7 (ref 5–15)
BUN: 36 mg/dL — AB (ref 6–20)
CHLORIDE: 102 mmol/L (ref 101–111)
CO2: 28 mmol/L (ref 22–32)
Calcium: 9.3 mg/dL (ref 8.9–10.3)
Creatinine, Ser: 1.62 mg/dL — ABNORMAL HIGH (ref 0.61–1.24)
GFR calc Af Amer: 49 mL/min — ABNORMAL LOW (ref >60)
GFR, EST NON AFRICAN AMERICAN: 42 mL/min — AB (ref >60)
GLUCOSE: 119 mg/dL — AB (ref 65–99)
POTASSIUM: 4.7 mmol/L (ref 3.5–5.1)
SODIUM: 137 mmol/L (ref 135–145)

## 2016-09-04 LAB — PROTIME-INR
INR: 2
Prothrombin Time: 22.9 seconds — ABNORMAL HIGH (ref 11.4–15.2)

## 2016-09-04 LAB — POCT INR: INR: 2.1

## 2016-09-08 ENCOUNTER — Ambulatory Visit (HOSPITAL_COMMUNITY): Payer: Medicare Other | Admitting: Anesthesiology

## 2016-09-08 ENCOUNTER — Encounter (HOSPITAL_COMMUNITY): Payer: Self-pay

## 2016-09-08 ENCOUNTER — Encounter (HOSPITAL_COMMUNITY)
Admission: RE | Disposition: A | Discharge status: Home / Self Care | Payer: Self-pay | Source: Ambulatory Visit | Attending: Cardiology

## 2016-09-08 ENCOUNTER — Ambulatory Visit (HOSPITAL_COMMUNITY)
Admission: RE | Admit: 2016-09-08 | Discharge: 2016-09-08 | Disposition: A | Discharge status: Home / Self Care | Payer: Medicare Other | Source: Ambulatory Visit | Attending: Cardiology | Admitting: Cardiology

## 2016-09-08 DIAGNOSIS — N183 Chronic kidney disease, stage 3 (moderate): Secondary | ICD-10-CM | POA: Insufficient documentation

## 2016-09-08 DIAGNOSIS — Z8249 Family history of ischemic heart disease and other diseases of the circulatory system: Secondary | ICD-10-CM | POA: Diagnosis not present

## 2016-09-08 DIAGNOSIS — I2510 Atherosclerotic heart disease of native coronary artery without angina pectoris: Secondary | ICD-10-CM | POA: Diagnosis not present

## 2016-09-08 DIAGNOSIS — I130 Hypertensive heart and chronic kidney disease with heart failure and stage 1 through stage 4 chronic kidney disease, or unspecified chronic kidney disease: Secondary | ICD-10-CM | POA: Diagnosis not present

## 2016-09-08 DIAGNOSIS — I5042 Chronic combined systolic (congestive) and diastolic (congestive) heart failure: Secondary | ICD-10-CM | POA: Diagnosis not present

## 2016-09-08 DIAGNOSIS — I5022 Chronic systolic (congestive) heart failure: Secondary | ICD-10-CM | POA: Diagnosis not present

## 2016-09-08 DIAGNOSIS — Z95810 Presence of automatic (implantable) cardiac defibrillator: Secondary | ICD-10-CM | POA: Diagnosis not present

## 2016-09-08 DIAGNOSIS — I4891 Unspecified atrial fibrillation: Secondary | ICD-10-CM

## 2016-09-08 DIAGNOSIS — E785 Hyperlipidemia, unspecified: Secondary | ICD-10-CM | POA: Insufficient documentation

## 2016-09-08 DIAGNOSIS — Z7984 Long term (current) use of oral hypoglycemic drugs: Secondary | ICD-10-CM | POA: Insufficient documentation

## 2016-09-08 DIAGNOSIS — Z79899 Other long term (current) drug therapy: Secondary | ICD-10-CM | POA: Diagnosis not present

## 2016-09-08 DIAGNOSIS — Z7901 Long term (current) use of anticoagulants: Secondary | ICD-10-CM | POA: Insufficient documentation

## 2016-09-08 DIAGNOSIS — Z952 Presence of prosthetic heart valve: Secondary | ICD-10-CM | POA: Insufficient documentation

## 2016-09-08 DIAGNOSIS — I4892 Unspecified atrial flutter: Secondary | ICD-10-CM | POA: Diagnosis not present

## 2016-09-08 DIAGNOSIS — Z951 Presence of aortocoronary bypass graft: Secondary | ICD-10-CM | POA: Diagnosis not present

## 2016-09-08 DIAGNOSIS — I483 Typical atrial flutter: Secondary | ICD-10-CM | POA: Diagnosis not present

## 2016-09-08 DIAGNOSIS — I25810 Atherosclerosis of coronary artery bypass graft(s) without angina pectoris: Secondary | ICD-10-CM | POA: Diagnosis not present

## 2016-09-08 DIAGNOSIS — E1122 Type 2 diabetes mellitus with diabetic chronic kidney disease: Secondary | ICD-10-CM | POA: Insufficient documentation

## 2016-09-08 DIAGNOSIS — I255 Ischemic cardiomyopathy: Secondary | ICD-10-CM | POA: Insufficient documentation

## 2016-09-08 DIAGNOSIS — Z7982 Long term (current) use of aspirin: Secondary | ICD-10-CM | POA: Insufficient documentation

## 2016-09-08 DIAGNOSIS — I110 Hypertensive heart disease with heart failure: Secondary | ICD-10-CM | POA: Diagnosis not present

## 2016-09-08 HISTORY — PX: CARDIOVERSION: SHX1299

## 2016-09-08 LAB — PROTIME-INR
INR: 2.63
PROTHROMBIN TIME: 28.6 s — AB (ref 11.4–15.2)

## 2016-09-08 LAB — GLUCOSE, CAPILLARY: GLUCOSE-CAPILLARY: 132 mg/dL — AB (ref 65–99)

## 2016-09-08 SURGERY — CARDIOVERSION
Anesthesia: Monitor Anesthesia Care

## 2016-09-08 MED ORDER — SODIUM CHLORIDE 0.9 % IV SOLN
INTRAVENOUS | Status: DC | PRN
  Administered 2016-09-08: 08:00:00 via INTRAVENOUS

## 2016-09-08 MED ORDER — PROPOFOL 10 MG/ML IV BOLUS
INTRAVENOUS | Status: DC | PRN
  Administered 2016-09-08: 80 mg via INTRAVENOUS

## 2016-09-08 MED ORDER — LIDOCAINE 2% (20 MG/ML) 5 ML SYRINGE
INTRAMUSCULAR | Status: DC | PRN
  Administered 2016-09-08: 50 mg via INTRAVENOUS

## 2016-09-08 NOTE — Anesthesia Preprocedure Evaluation (Addendum)
Anesthesia Evaluation  Patient identified by MRN, date of birth, ID band Patient awake    Reviewed: Allergy & Precautions, NPO status , Patient's Chart, lab work & pertinent test results  History of Anesthesia Complications (+) PROLONGED EMERGENCE  Airway Mallampati: II  TM Distance: >3 FB     Dental   Pulmonary shortness of breath,    breath sounds clear to auscultation       Cardiovascular hypertension, + angina + CAD, + Past MI, + Peripheral Vascular Disease and +CHF   Rhythm:Regular Rate:Normal     Neuro/Psych    GI/Hepatic negative GI ROS, Neg liver ROS,   Endo/Other  diabetes  Renal/GU Renal disease     Musculoskeletal   Abdominal   Peds  Hematology   Anesthesia Other Findings   Reproductive/Obstetrics                            Anesthesia Physical Anesthesia Plan  ASA: III  Anesthesia Plan: MAC   Post-op Pain Management:    Induction: Intravenous  Airway Management Planned: Simple Face Mask  Additional Equipment:   Intra-op Plan:   Post-operative Plan:   Informed Consent: I have reviewed the patients History and Physical, chart, labs and discussed the procedure including the risks, benefits and alternatives for the proposed anesthesia with the patient or authorized representative who has indicated his/her understanding and acceptance.   Dental advisory given  Plan Discussed with: CRNA and Anesthesiologist  Anesthesia Plan Comments:         Anesthesia Quick Evaluation

## 2016-09-08 NOTE — Discharge Instructions (Signed)
Electrical Cardioversion, Care After °This sheet gives you information about how to care for yourself after your procedure. Your health care provider may also give you more specific instructions. If you have problems or questions, contact your health care provider. °What can I expect after the procedure? °After the procedure, it is common to have: °· Some redness on the skin where the shocks were given. °Follow these instructions at home: °· Do not drive for 24 hours if you were given a medicine to help you relax (sedative). °· Take over-the-counter and prescription medicines only as told by your health care provider. °· Ask your health care provider how to check your pulse. Check it often. °· Rest for 48 hours after the procedure or as told by your health care provider. °· Avoid or limit your caffeine use as told by your health care provider. °Contact a health care provider if: °· You feel like your heart is beating too quickly or your pulse is not regular. °· You have a serious muscle cramp that does not go away. °Get help right away if: °· You have discomfort in your chest. °· You are dizzy or you feel faint. °· You have trouble breathing or you are short of breath. °· Your speech is slurred. °· You have trouble moving an arm or leg on one side of your body. °· Your fingers or toes turn cold or blue. °This information is not intended to replace advice given to you by your health care provider. Make sure you discuss any questions you have with your health care provider. °Document Released: 07/09/2013 Document Revised: 04/21/2016 Document Reviewed: 03/24/2016 °Elsevier Interactive Patient Education © 2017 [employer] ° °

## 2016-09-08 NOTE — Interval H&P Note (Signed)
History and Physical Interval Note:  09/08/2016 8:19 AM  Christian Bowman  has presented today for surgery, with the diagnosis of AFLUTTER  The various methods of treatment have been discussed with the patient and family. After consideration of risks, benefits and other options for treatment, the patient has consented to  Procedure(s): CARDIOVERSION (N/A) as a surgical intervention .  The patient's history has been reviewed, patient examined, no change in status, stable for surgery.  I have reviewed the patient's chart and labs.  Questions were answered to the patient's satisfaction.     Dorris Carnes

## 2016-09-08 NOTE — H&P (View-Only) (Signed)
PCP: Dr Hilma Favors Cardiology: Dr. Sallyanne Kuster HF Cardiology: Dr. Aundra Dubin  70 yo with history of CAD s/p CABG in 2007 then redo CABG in 10/17 with mitral valve replacement presents for cardiology followup.  He was admitted in 10/17 for redo CABG with SVG-PDA and SVG-ramus.  He also had mitral valve replacement with a bioprosthetic valve because of infarct-related mitral regurgitation.  His EF on last study (10/17 TEE) was 20-25%.   Post-operative course was complicated by CHF requiring diuresis.  He also had atrial flutter and required DCCV.  Due to complete heart block, he later got a CRT-D system.    He is now back home with his wife in [place].  At last visit, he was in atrial flutter.  He saw Dr. Curt Bears, it was decided to arrange for DCCV with ablation down the road when PPM leads have been in longer.  He remains in rate-controlled atrial flutter today.    Breathing varies.  On some days, he feels quite good, on others not good.  Today is a good day.  No dyspnea walking on flat ground today.  No orthopnea/PND.  Nausea better now that he is taking amiodarone at night.  Weight down 5 lbs.  Evening Lasix was stopped due to elevated creatinine, now just taking 40 mg daily.  CXR today showed resolution of pleural effusion.  No chest pain.  He does not feel palpitations.   ECG: atrial flutter, BiV paced  Labs (11/17): K 4.8, creatinine 1.89, hgb 12.3, digoxin 0.9, LFTs normal  PMH: 1. CAD: CABG 2007.  - LHC (9/17) with patent LIMA-LAD, totally occluded SVG-D, severe stenosis in SVG-PDA.  - Redo CABG 10/17 with SVG-PDA, SVG-ramus and mitral valve replacement.  2. Mitral regurgitation: Ischemic MR, mitral valve replacement was done in 10/17 (bioprosthetic). 3. Complete heart block: Post-op in 10/17.  Medtronic CRT-D placed.  4. Atrial flutter: DCCV 10/17.  5. Type II diabetes 6. Hyperlipidemia 7. Chronic systolic CHF: Ischemic cardiomyopathy.   - TEE (10/17): EF 20-25%, severe LV dilation,  SH:  Married, retired, lives in [place], nonsmoker, no ETOH.   Family History  Problem Relation Age of Onset  . Heart attack Mother   . Heart disease Mother   . Heart attack Father   . Heart disease Father   . Hypertension Father   . Hypertension Sister   . Heart disease Maternal Grandmother    ROS: All systems reviewed and negative except as per HPI.   Current Outpatient Prescriptions  Medication Sig Dispense Refill  . acetaminophen (TYLENOL) 325 MG tablet Take 1-2 tablets (325-650 mg total) by mouth every 4 (four) hours as needed for mild pain.    [name] amiodarone (PACERONE) 200 MG tablet Take 1 tablet (200 mg total) by mouth at bedtime. 30 tablet 3  . aspirin EC 81 MG tablet Take 81 mg by mouth every evening.    . digoxin (LANOXIN) 0.125 MG tablet Take 0.5 tablets (0.0625 mg total) by mouth daily. 30 tablet 1  . furosemide (LASIX) 40 MG tablet Take 1 tablet (40 mg total) by mouth daily. 90 tablet 3  . glucose blood (ACCU-CHEK AVIVA) test strip Use as instructed bid E11.65 100 each 5  . metFORMIN (GLUCOPHAGE) 500 MG tablet Take 1 tablet (500 mg total) by mouth 2 (two) times daily with a meal. 180 tablet 1  . ondansetron (ZOFRAN-ODT) 4 MG disintegrating tablet Take 1 tablet (4 mg total) by mouth every 8 (eight) hours as needed for nausea or vomiting. 7 tablet  0  . oxyCODONE (OXY IR/ROXICODONE) 5 MG immediate release tablet Take 5 mg by mouth every 4-6 hours PRN severe pain. 28 tablet 0  . rosuvastatin (CRESTOR) 10 MG tablet TAKE ONE TABLET BY MOUTH ONCE DAILY 90 tablet 0  . sacubitril-valsartan (ENTRESTO) 49-51 MG Take 1 tablet by mouth 2 (two) times daily. 60 tablet 3  . spironolactone (ALDACTONE) 25 MG tablet Take 1 tablet (25 mg total) by mouth daily. 30 tablet 3  . warfarin (COUMADIN) 7.5 MG tablet Take 1 tablet (7.5 mg total) by mouth daily at 6 PM. 30 tablet 3  . carvedilol (COREG) 3.125 MG tablet Take 1 tablet (3.125 mg total) by mouth 2 (two) times daily. 60 tablet 3   No current  facility-administered medications for this encounter.    BP 126/74   Pulse 85   Wt 169 lb 6.4 oz (76.8 kg)   SpO2 100%   BMI 22.97 kg/m  General: NAD Neck: No JVD, no thyromegaly or thyroid nodule. Right neck large lipoma.  Lungs: Clear to auscultation bilaterally with normal respiratory effort. CV: Nondisplaced PMI.  Heart regular S1/S2, no S3/S4, 1/6 SEM RUSB.  No peripheral edema.  No carotid bruit.  Normal pedal pulses.  Abdomen: Soft, nontender, no hepatosplenomegaly, no distention.  Skin: Intact without lesions or rashes.  Neurologic: Alert and oriented x 3.  Psych: Normal affect. Extremities: No clubbing or cyanosis.  HEENT: Normal.   Assessment/Plan: 1. Chronic systolic CHF: Ischemic cardiomyopathy.  TEE 10/17 with EF 20-25%.  Has Medtronic CRT-D system.  On exam today, he is not volume overloaded, NYHA class II-III symptoms.  Atrial flutter has impeded ideal BiV pacing.   - Continue Lasix 40 mg daily, BMET/BNP today.   - Continue Entresto 49/51 bid.   - Continue current spironolactone and digoxin.  Check digoxin level.  - Add Coreg 3.125 mg bid today.  2. CAD: s/p redo CABG.  No chest pain.  - Continue statin => Crestor 10 mg daily.  - Refer to cardiac rehab in [place] => has not heard from them yet.  3. Bioprosthetic mitral valve: Stable on 10/17 TEE. 4. Atrial flutter: Patient remains in atrial flutter.  This is decreasing is BiV pacing percentage.  I think he needs to try to get out of atrial flutter.   - He is taking amiodarone, tolerating this better with evening dosing. Recent LFTs normal.  TSH was high => will recheck TSH along with free T3 and free T4.  Will need regular eye exam.  - Saw Dr. Curt Bears, plan for now is DCCV after 3 weeks of therapeutic INR.  Will then have eventual atrial flutter ablation after his device leads have been in for longer.   - Continue warfarin. Will have weekly INRs until DCCV.  5. CKD: Stage III.  BMET today.   Followup with me in 3 wks.     Loralie Champagne 08/29/2016

## 2016-09-08 NOTE — Transfer of Care (Signed)
Immediate Anesthesia Transfer of Care Note  Patient: Christian Bowman  Procedure(s) Performed: Procedure(s): CARDIOVERSION (N/A)  Patient Location: PACU and Endoscopy Unit  Anesthesia Type:General  Level of Consciousness: awake, patient cooperative and lethargic  Airway & Oxygen Therapy: Patient Spontanous Breathing and Patient connected to nasal cannula oxygen  Post-op Assessment: Report given to RN and Post -op Vital signs reviewed and stable  Post vital signs: Reviewed and stable  Last Vitals:  Vitals:   09/08/16 0706  BP: 138/76  Pulse: 60  Resp: 20  Temp: 36.4 C    Last Pain:  Vitals:   09/08/16 0706  TempSrc: Oral         Complications: No apparent anesthesia complications

## 2016-09-08 NOTE — Op Note (Signed)
Patient anesthetized by anesthesia with 80 mg Propofol With pads in AP position, pt cardioverted to SR with 200 J synchronized biphasic energy Procedure without complication

## 2016-09-11 ENCOUNTER — Ambulatory Visit (INDEPENDENT_AMBULATORY_CARE_PROVIDER_SITE_OTHER): Payer: Medicare Other | Admitting: *Deleted

## 2016-09-11 ENCOUNTER — Ambulatory Visit (INDEPENDENT_AMBULATORY_CARE_PROVIDER_SITE_OTHER): Payer: Medicare Other | Admitting: Cardiovascular Disease

## 2016-09-11 ENCOUNTER — Encounter (HOSPITAL_COMMUNITY): Payer: Self-pay | Admitting: Internal Medicine

## 2016-09-11 VITALS — BP 132/80 | HR 77 | Ht 72.0 in | Wt 173.0 lb

## 2016-09-11 DIAGNOSIS — Z95810 Presence of automatic (implantable) cardiac defibrillator: Secondary | ICD-10-CM | POA: Insufficient documentation

## 2016-09-11 DIAGNOSIS — Z953 Presence of xenogenic heart valve: Secondary | ICD-10-CM

## 2016-09-11 DIAGNOSIS — I701 Atherosclerosis of renal artery: Secondary | ICD-10-CM

## 2016-09-11 DIAGNOSIS — I2510 Atherosclerotic heart disease of native coronary artery without angina pectoris: Secondary | ICD-10-CM

## 2016-09-11 DIAGNOSIS — I10 Essential (primary) hypertension: Secondary | ICD-10-CM

## 2016-09-11 DIAGNOSIS — I483 Typical atrial flutter: Secondary | ICD-10-CM

## 2016-09-11 DIAGNOSIS — E1159 Type 2 diabetes mellitus with other circulatory complications: Secondary | ICD-10-CM

## 2016-09-11 DIAGNOSIS — Z5181 Encounter for therapeutic drug level monitoring: Secondary | ICD-10-CM

## 2016-09-11 DIAGNOSIS — E782 Mixed hyperlipidemia: Secondary | ICD-10-CM

## 2016-09-11 DIAGNOSIS — I25810 Atherosclerosis of coronary artery bypass graft(s) without angina pectoris: Secondary | ICD-10-CM | POA: Diagnosis not present

## 2016-09-11 DIAGNOSIS — I5042 Chronic combined systolic (congestive) and diastolic (congestive) heart failure: Secondary | ICD-10-CM

## 2016-09-11 LAB — POCT INR: INR: 3.7

## 2016-09-11 MED ORDER — FUROSEMIDE 20 MG PO TABS: 20.0000 mg | ORAL_TABLET | Freq: Every day | ORAL | 11 refills | Status: DC

## 2016-09-11 NOTE — Patient Instructions (Signed)
Dr Sallyanne Kuster has recommended making the following medication changes: 1. DECREASE Furosemide to 20 mg daily  Your physician recommends that you schedule a follow-up appointment in 3 months with Dr C.  If you need a refill on your cardiac medications before your next appointment, please call your pharmacy.

## 2016-09-11 NOTE — Progress Notes (Signed)
Cardiology Office Note    Date:  09/11/2016   ID:  Christian Bowman, DOB 11/25/1945, MRN [id number]  PCP:  Purvis Kilts, MD  Cardiologist:  Loralie Champagne, MD; Allegra Lai, MD; Sanda Klein, MD   Chief Complaint  Patient presents with  . Follow-up    History of Present Illness:  Christian Bowman is a 70 y.o. male with extensive and severe cardiac problems.  He underwent redo coronary artery bypass 2 (SVG to PDA, SVG to ramus), as well as mitral valve replacement (bioprosthetic Edwards magna 31 mm) on [id number], complicated by AV block and atrial flutter requiring cardioversion. Estimated EF 20-25 % on his 07/18/16 TEE. A biventricular defibrillator (Medtronic Claria MRI CRT-D) was implanted on October 24.   He was discharged home on October 31 and just 2 days later was seen in the emergency room in [place] with symptomatic atrial flutter that resolved spontaneously. A few days later he had recurrent atrial flutter that lasted for about a month. He underwent successful cardioversion on December 8 and has not had recurrence of atrial arrhythmia since that time, on amiodarone. He has already discussed the option for flutter ablation with Dr. Curt Bears, which hopefully will allow discontinuation of amiodarone.   He is therapeutically anticoagulated and has not had any bleeding complications or neurological events. Recovery from cardiac surgery has been very slow, but he is now able to lie fully supine in bed at night without orthopnea and wants to become more physically active. He has had episodes of severe dizziness at home associated with drops in blood pressure when upright. 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 He wants to start cardiac rehabilitation, which he plans to do in [place].  He understands the need for endocarditis prophylaxis with dental procedures or other medical procedures that might cause bacteremia and the indication for  anticoagulation chronically.  I did a brief device interrogation today to see his intrinsic rhythm. He is atrial sensed ventricular paced. He device outputs are still at high [place] levels but even with those settings the estimated battery longevity 7.2 years. The left ventricular lead is programmed LV 1-LV 2. Leanne Lovely is not yet mature. There have been no episodes of ventricular tachyarrhythmia.  Past Medical History:  Diagnosis Date  . Anginal pain ([place])   . CAD (coronary artery disease)   . Coronary artery disease involving coronary bypass graft   . Cyst of neck    right side  . DM2 (diabetes mellitus, type 2) ([place]) 08/26/2013  . Dyspnea   . Heart attack   . HTN (hypertension) 08/26/2013  . Hyperlipidemia 08/26/2013  . Left main coronary artery disease   . Left renal artery stenosis ([place])    Genesis 6x12 stent 2007  . Obesity (BMI 30.0-34.9) 08/26/2013  . Postoperative atrial fibrillation ([place]) 10/15/2005  . S/P CABG x 4 10/13/2005   LIMA to LAD, SVG to intermediate branch, sequential SVG to PDA and RPL branch, EVH via right thigh  . S/P mitral valve replacement with bioprosthetic valve 07/11/2016   31 mm [facility] Mitral bovine bioprosthetic tissue valve  . S/P redo CABG x 2 07/11/2016   SVG to PDA and SVG to Intermediate Branch, EVH via left thigh    Past Surgical History:  Procedure Laterality Date  . CARDIAC CATHETERIZATION N/A 06/21/2016   Procedure: Right/Left Heart Cath and Coronary/Graft Angiography;  Surgeon: Sherren Mocha, MD;  Location: [facility] CV LAB;  Service: Cardiovascular;  Laterality: N/A;  . CARDIOVERSION N/A 07/19/2016   Procedure: CARDIOVERSION;  Surgeon: Lelon Perla, MD;  Location: [facility] ENDOSCOPY;  Service: Cardiovascular;  Laterality: N/A;  . CARDIOVERSION N/A 09/08/2016   Procedure: CARDIOVERSION;  Surgeon: Fay Records, MD;  Location: [facility] ENDOSCOPY;  Service: Cardiovascular;  Laterality: N/A;  . CORONARY ARTERY BYPASS GRAFT  10/13/2005   LIMA to  LAD, SVG to intermediate branch, sequential SVG to PDA and RPL  . CORONARY ARTERY BYPASS GRAFT N/A 07/11/2016   Procedure: REDO CORONARY ARTERY BYPASS GRAFTING (CABG) x two using left leg greater saphenous vein harvested endoscopically-SVG to PDA -SVG to RAMUS INTERMEDIATE;  Surgeon: Rexene Alberts, MD;  Location: [place];  Service: Open Heart Surgery;  Laterality: N/A;  . CORONARY ARTERY BYPASS GRAFT N/A 07/11/2016   Procedure: Re-exploration (CABG) for post op bleeding,;  Surgeon: Rexene Alberts, MD;  Location: [place];  Service: Open Heart Surgery;  Laterality: N/A;  . EP IMPLANTABLE DEVICE N/A 07/25/2016   Procedure: BiV ICD Insertion CRT-D;  Surgeon: Will Meredith Leeds, MD;  Location: [place] CV LAB;  Service: Cardiovascular;  Laterality: N/A;  . MITRAL VALVE REPLACEMENT N/A 07/11/2016   Procedure: MITRAL VALVE (MV) REPLACEMENT;  Surgeon: Rexene Alberts, MD;  Location: [place];  Service: Open Heart Surgery;  Laterality: N/A;  . MYOCARDICAL PERFUSION  10/09/2007   NORMAL PERFUSION IN ALL REGIONS;NO EVIDENCE OF INDUCIBLE ISCHEMIA;POST STRESS EF% 66  . PERCUTANEOUS CORONARY STENT INTERVENTION (PCI-S)     DES in SVG to right coronary artery system  . RENAL ARTERY STENT Right 2007  . RENAL DOPPLER  03/28/2010   RIGHT RA-NORMAL;LEFT PROXIMAL RA AT STENT-PATENT WITH NO EVIDENCE OF SIGN DIAMETER REDUCTION. R & L KIDNEYS: EQUAL IN SIZE,SYMMETRICAL IN SHAPE.  . TEE WITHOUT CARDIOVERSION N/A 06/15/2016   Procedure: TRANSESOPHAGEAL ECHOCARDIOGRAM (TEE);  Surgeon: Sanda Klein, MD;  Location: [facility] ENDOSCOPY;  Service: Cardiovascular;  Laterality: N/A;  . TEE WITHOUT CARDIOVERSION N/A 07/11/2016   Procedure: TRANSESOPHAGEAL ECHOCARDIOGRAM (TEE);  Surgeon: Rexene Alberts, MD;  Location: [place];  Service: Open Heart Surgery;  Laterality: N/A;  . TEE WITHOUT CARDIOVERSION N/A 07/19/2016   Procedure: TRANSESOPHAGEAL ECHOCARDIOGRAM (TEE);  Surgeon: Lelon Perla, MD;  Location: [facility] ENDOSCOPY;  Service:  Cardiovascular;  Laterality: N/A;  . TRANSESOPHAGEAL ECHOCARDIOGRAM  10/19/2005   NORMAL LV; MILD TO MODERATE AMOUNT OF SOFT ATHEROMATOUS PLAQUE OF THE THORACIC AORTA; THE LEFT ATRIUM IS MILDLY DILATED;LEFT ATRIAL APPENDAGE FUNCTION IS NORMAL;NO THROMBUS IDENTIFIED. SMALL PFO WITH RIGHT TO LEFT SHUNT    Current Medications: Outpatient Medications Prior to Visit  Medication Sig Dispense Refill  . amiodarone (PACERONE) 200 MG tablet Take 1 tablet (200 mg total) by mouth at bedtime. 30 tablet 3  . aspirin EC 81 MG tablet Take 81 mg by mouth every evening.    . carvedilol (COREG) 3.125 MG tablet Take 1 tablet (3.125 mg total) by mouth 2 (two) times daily. 60 tablet 3  . digoxin (LANOXIN) 0.125 MG tablet Take 0.5 tablets (0.0625 mg total) by mouth daily. 30 tablet 1  . glucose blood (ACCU-CHEK AVIVA) test strip Use as instructed bid E11.65 100 each 5  . metFORMIN (GLUCOPHAGE) 500 MG tablet Take 1 tablet (500 mg total) by mouth 2 (two) times daily with a meal. 180 tablet 1  . rosuvastatin (CRESTOR) 10 MG tablet TAKE ONE TABLET BY MOUTH ONCE DAILY 90 tablet 0  . sacubitril-valsartan (ENTRESTO) 49-51 MG Take 1 tablet by mouth 2 (two) times  daily. 60 tablet 3  . spironolactone (ALDACTONE) 25 MG tablet Take 1 tablet (25 mg total) by mouth daily. 30 tablet 3  . warfarin (COUMADIN) 7.5 MG tablet Take 1 tablet (7.5 mg total) by mouth daily at 6 PM. 30 tablet 3  . furosemide (LASIX) 40 MG tablet Take 1 tablet (40 mg total) by mouth daily. 90 tablet 3  . ondansetron (ZOFRAN-ODT) 4 MG disintegrating tablet Take 1 tablet (4 mg total) by mouth every 8 (eight) hours as needed for nausea or vomiting. 7 tablet 0  . oxyCODONE (OXY IR/ROXICODONE) 5 MG immediate release tablet Take 5 mg by mouth every 4-6 hours PRN severe pain. (Patient not taking: Reported on 09/11/2016) 28 tablet 0  . acetaminophen (TYLENOL) 325 MG tablet Take 1-2 tablets (325-650 mg total) by mouth every 4 (four) hours as needed for mild pain.  (Patient not taking: Reported on 09/11/2016)     No facility-administered medications prior to visit.      Allergies:   Xanax [alprazolam]   Social History   Social History  . Marital status: Married    Spouse name: N/A  . Number of children: N/A  . Years of education: N/A   Social History Main Topics  . Smoking status: Never Smoker  . Smokeless tobacco: Never Used  . Alcohol use No  . Drug use: No  . Sexual activity: Not Currently   Other Topics Concern  . None   Social History Narrative  . None     Family History:  The patient's family history includes Heart attack in his father and mother; Heart disease in his father, maternal grandmother, and mother; Hypertension in his father and sister.   ROS:   Please see the history of present illness.    ROS All other systems reviewed and are negative.   PHYSICAL EXAM:   VS:  BP 132/80 (BP Location: Right Arm, Patient Position: Sitting, Cuff Size: Normal)   Pulse 77   Ht 6' (1.829 m)   Wt 173 lb (78.5 kg)   SpO2 99%   BMI 23.46 kg/m    GEN: Well nourished, well developed, in no acute distress  HEENT: normal  Neck: no JVD, carotid bruits, or masses Cardiac: RRR; no murmurs, rubs, or gallops,no edema  Respiratory:  clear to auscultation bilaterally, normal work of breathing GI: soft, nontender, nondistended, + BS MS: no deformity or atrophy  Skin: warm and dry, no rash Neuro:  Alert and Oriented x 3, Strength and sensation are intact Psych: euthymic mood, full affect  Wt Readings from Last 3 Encounters:  09/11/16 173 lb (78.5 kg)  09/08/16 169 lb (76.7 kg)  08/28/16 169 lb 6.4 oz (76.8 kg)      Studies/Labs Reviewed:   EKG:  EKG is not ordered today.  The intracardiac electrogram ordered today demonstrates atrial sensed, biventricular paced rhythm without recurrence of atrial flutter since cardioversion last week  Recent Labs: 07/17/2016: Magnesium 2.1 08/28/2016: ALT 24; B Natriuretic Peptide 1,608.1; TSH  8.235 09/04/2016: BUN 36; Creatinine, Ser 1.62; Hemoglobin 13.6; Platelets 167; Potassium 4.7; Sodium 137   Lipid Panel    Component Value Date/Time   CHOL 95 (L) 04/20/2016 0805   TRIG 99 07/17/2016 0335   HDL 28 (L) 04/20/2016 0805   CHOLHDL 3.4 04/20/2016 0805   LDLCALC 45 04/20/2016 0805    Additional studies/ records that were reviewed today include:  Notes from Dr. Roxy Manns, Dr. Curt Bears, heart failure clinic  ASSESSMENT:    1. Chronic combined  systolic and diastolic heart failure ([place])   2. Coronary artery disease involving coronary bypass graft of native heart without angina pectoris   3. Typical atrial flutter ([place])   4. S/P mitral valve replacement with bioprosthetic valve + redo CABG x2   5. Presence of biventricular implantable cardioverter-defibrillator (ICD)   6. Essential hypertension   7. Mixed hyperlipidemia   8. Type 2 diabetes mellitus with vascular disease ([place])   9. Left renal artery stenosis ([facility])      PLAN:  In order of problems listed above:  1. CHF: NYHA class 2, appears clinically euvolemic and with episodes of hypotension that may signify hypovolemia. I have asked him to decrease the dose of furosemide further to only 20 mg daily. Continue Entresto and spironolactone. I think it still premature to consider increasing his carvedilol dose. Preoperatively he had a markedly dilated and dysfunctional left ventricle. Time to start cardiac rehabilitation and increased physical activity in general. 2. CAD: He does not have angina pectoris. Acute coronary anatomy s/p redo CABG. 3. AFlutter: With a bioprosthetic mitral valve, CHADSVasc score no longer relevant. Unless he undergoes successful ablation, sugar monitoring on lifelong anticoagulation. Even if ablation is successful, need to consider high risk for future atrial fibrillation. However, flutter ablation would definitely allow for improved resynchronization. 4. S/P MVR: On anticoagulation for now. If the issue of  atrial flutter was resolved, if there is no incident atrial fibrillation, consider discontinuation of warfarin in 3-6 months 5. CRT-D: Excellent device function with 100% biventricular pacing since his last cardioversion. 6. Hx HTN, now Hypotension due to meds: Symptomatic events, decreased loop diuretic dose. 7. HLP: On statin. Repeat lipid profile no sooner than 3 months from hospital discharge. 8. DM: Now on metformin monotherapy with substantial weight loss over the last few months weight is in target range. Glycemic control has been excellent. 9. History of left renal artery stenosis status post stent 2007, patent by duplex ultrasound September 2016    Medication Adjustments/Labs and Tests Ordered: Current medicines are reviewed at length with the patient today.  Concerns regarding medicines are outlined above.  Medication changes, Labs and Tests ordered today are listed in the Patient Instructions below. Patient Instructions  Dr Sallyanne Kuster has recommended making the following medication changes: 1. DECREASE Furosemide to 20 mg daily  Your physician recommends that you schedule a follow-up appointment in 3 months with Dr C.  If you need a refill on your cardiac medications before your next appointment, please call your pharmacy.    Signed, Sanda Klein, MD  09/11/2016 5:20 PM    [facility] [address], [place], [place]  [address] Phone: [phone]; Fax: [phone]

## 2016-09-11 NOTE — Anesthesia Postprocedure Evaluation (Signed)
Anesthesia Post Note  Patient: Christian Bowman  Procedure(s) Performed: Procedure(s) (LRB): CARDIOVERSION (N/A)  Patient location during evaluation: PACU Level of consciousness: awake Vital Signs Assessment: post-procedure vital signs reviewed and stable Respiratory status: spontaneous breathing Cardiovascular status: stable Anesthetic complications: no    Last Vitals:  Vitals:   09/08/16 0855 09/08/16 0900  BP: 136/81 (!) 148/80  Pulse: 71 73  Resp: (!) 22 20  Temp:      Last Pain:  Vitals:   09/08/16 0706  TempSrc: Oral                 Nhung Danko

## 2016-09-13 ENCOUNTER — Telehealth: Payer: Self-pay | Admitting: *Deleted

## 2016-09-13 NOTE — Telephone Encounter (Signed)
-----   Message from Will Meredith Leeds, MD sent at 09/12/2016  9:46 AM EST ----- Would prefer he shoots after 3 month visit with me due to the device.  WC ----- Message ----- From: Sanda Klein, MD Sent: 09/11/2016   5:59 PM To: Larey Dresser, MD, Will Meredith Leeds, MD, #  He is finally looking better! Gave him go ahead for cardiac rehabilitation. Cub, is it okay for him to shoot a rifle now, 2 months after surgery? He doesn't hunt, just target practice. Christian Bowman

## 2016-09-13 NOTE — Telephone Encounter (Signed)
Notified wife (ok per DPR) to ask pt to wait until 62mo post implant f/u visit with Dr. Curt Bears before target practicing. Wife verbalized understanding and voiced [facility] office called yesterday and informed them.  She thanks me for following up.

## 2016-09-18 ENCOUNTER — Ambulatory Visit (HOSPITAL_COMMUNITY)
Admission: RE | Admit: 2016-09-18 | Discharge: 2016-09-18 | Disposition: A | Discharge status: Home / Self Care | Payer: Medicare Other | Source: Ambulatory Visit | Attending: Cardiology | Admitting: Cardiology

## 2016-09-18 ENCOUNTER — Ambulatory Visit (INDEPENDENT_AMBULATORY_CARE_PROVIDER_SITE_OTHER): Payer: Medicare Other | Admitting: *Deleted

## 2016-09-18 ENCOUNTER — Encounter (HOSPITAL_COMMUNITY): Payer: Self-pay

## 2016-09-18 VITALS — BP 118/70 | HR 95 | Wt 171.1 lb

## 2016-09-18 DIAGNOSIS — I4892 Unspecified atrial flutter: Secondary | ICD-10-CM | POA: Diagnosis not present

## 2016-09-18 DIAGNOSIS — I255 Ischemic cardiomyopathy: Secondary | ICD-10-CM | POA: Insufficient documentation

## 2016-09-18 DIAGNOSIS — Z951 Presence of aortocoronary bypass graft: Secondary | ICD-10-CM | POA: Insufficient documentation

## 2016-09-18 DIAGNOSIS — Z79899 Other long term (current) drug therapy: Secondary | ICD-10-CM | POA: Insufficient documentation

## 2016-09-18 DIAGNOSIS — N183 Chronic kidney disease, stage 3 (moderate): Secondary | ICD-10-CM | POA: Insufficient documentation

## 2016-09-18 DIAGNOSIS — Z7982 Long term (current) use of aspirin: Secondary | ICD-10-CM | POA: Insufficient documentation

## 2016-09-18 DIAGNOSIS — I25810 Atherosclerosis of coronary artery bypass graft(s) without angina pectoris: Secondary | ICD-10-CM

## 2016-09-18 DIAGNOSIS — I483 Typical atrial flutter: Secondary | ICD-10-CM

## 2016-09-18 DIAGNOSIS — Z5189 Encounter for other specified aftercare: Secondary | ICD-10-CM | POA: Insufficient documentation

## 2016-09-18 DIAGNOSIS — Z5181 Encounter for therapeutic drug level monitoring: Secondary | ICD-10-CM | POA: Diagnosis not present

## 2016-09-18 DIAGNOSIS — I5042 Chronic combined systolic (congestive) and diastolic (congestive) heart failure: Secondary | ICD-10-CM | POA: Diagnosis not present

## 2016-09-18 DIAGNOSIS — Z7984 Long term (current) use of oral hypoglycemic drugs: Secondary | ICD-10-CM | POA: Insufficient documentation

## 2016-09-18 DIAGNOSIS — E1122 Type 2 diabetes mellitus with diabetic chronic kidney disease: Secondary | ICD-10-CM | POA: Insufficient documentation

## 2016-09-18 DIAGNOSIS — Z953 Presence of xenogenic heart valve: Secondary | ICD-10-CM | POA: Diagnosis not present

## 2016-09-18 DIAGNOSIS — I2510 Atherosclerotic heart disease of native coronary artery without angina pectoris: Secondary | ICD-10-CM

## 2016-09-18 LAB — BASIC METABOLIC PANEL
Anion gap: 9 (ref 5–15)
BUN: 33 mg/dL — AB (ref 6–20)
CHLORIDE: 104 mmol/L (ref 101–111)
CO2: 27 mmol/L (ref 22–32)
CREATININE: 1.7 mg/dL — AB (ref 0.61–1.24)
Calcium: 9.2 mg/dL (ref 8.9–10.3)
GFR calc Af Amer: 46 mL/min — ABNORMAL LOW (ref >60)
GFR calc non Af Amer: 40 mL/min — ABNORMAL LOW (ref >60)
GLUCOSE: 85 mg/dL (ref 65–99)
POTASSIUM: 4.5 mmol/L (ref 3.5–5.1)
Sodium: 140 mmol/L (ref 135–145)

## 2016-09-18 LAB — BRAIN NATRIURETIC PEPTIDE: B Natriuretic Peptide: 3909.3 pg/mL — ABNORMAL HIGH (ref 0.0–100.0)

## 2016-09-18 LAB — POCT INR: INR: 3.4

## 2016-09-18 MED ORDER — AMIODARONE HCL 200 MG PO TABS: 200.0000 mg | ORAL_TABLET | Freq: Every day | ORAL | 3 refills | Status: DC

## 2016-09-18 MED ORDER — FUROSEMIDE 20 MG PO TABS: 20.0000 mg | ORAL_TABLET | ORAL | 11 refills | Status: DC

## 2016-09-18 NOTE — Progress Notes (Signed)
PCP: Dr Hilma Favors Cardiology: Dr. Sallyanne Kuster HF Cardiology: Dr. Aundra Dubin  70 yo with history of CAD s/p CABG in 2007 then redo CABG in 10/17 with mitral valve replacement presents for cardiology followup.  He was admitted in 10/17 for redo CABG with SVG-PDA and SVG-ramus.  He also had mitral valve replacement with a bioprosthetic valve because of infarct-related mitral regurgitation.  His EF on last study (10/17 TEE) was 20-25%.   Post-operative course was complicated by CHF requiring diuresis.  He also had atrial flutter and required DCCV.  Due to complete heart block, he later got a CRT-D system.    He is now back home with his wife in [place].  At last visit, he was in atrial flutter.  He saw Dr. Curt Bears, it was decided to arrange for DCCV with ablation down the road when PPM leads have been in longer.  He remains in rate-controlled atrial flutter today.    Today he returns for HF follow up. On 12/8 he had successful cardioversion. Last night he had an episode of nausea and vomiting. After he vomited he felt better. Mild SOB with exertion. Complaining dizziness when standing. Weight at home 164 pounds. Says weight is trending down. Weight trending down. Taking all medicaitons.   Labs (11/17): K 4.8, creatinine 1.89, hgb 12.3, digoxin 0.9, LFTs normal Labs 09/04/2016: K 4.7 Creatinine 1.62   PMH: 1. CAD: CABG 2007.  - LHC (9/17) with patent LIMA-LAD, totally occluded SVG-D, severe stenosis in SVG-PDA.  - Redo CABG 10/17 with SVG-PDA, SVG-ramus and mitral valve replacement.  2. Mitral regurgitation: Ischemic MR, mitral valve replacement was done in 10/17 (bioprosthetic). 3. Complete heart block: Post-op in 10/17.  Medtronic CRT-D placed.  4. Atrial flutter: DCCV 10/17.  5. Type II diabetes 6. Hyperlipidemia 7. Chronic systolic CHF: Ischemic cardiomyopathy.   - TEE (10/17): EF 20-25%, severe LV dilation,  SH: Married, retired, lives in [place], nonsmoker, no ETOH.   Family History  Problem  Relation Age of Onset  . Heart attack Mother   . Heart disease Mother   . Heart attack Father   . Heart disease Father   . Hypertension Father   . Hypertension Sister   . Heart disease Maternal Grandmother    ROS: All systems reviewed and negative except as per HPI.   Current Outpatient Prescriptions  Medication Sig Dispense Refill  . amiodarone (PACERONE) 200 MG tablet Take 1 tablet (200 mg total) by mouth at bedtime. 30 tablet 3  . aspirin EC 81 MG tablet Take 81 mg by mouth every evening.    . carvedilol (COREG) 3.125 MG tablet Take 1 tablet (3.125 mg total) by mouth 2 (two) times daily. 60 tablet 3  . digoxin (LANOXIN) 0.125 MG tablet Take 0.5 tablets (0.0625 mg total) by mouth daily. 30 tablet 1  . furosemide (LASIX) 20 MG tablet Take 1 tablet (20 mg total) by mouth daily. 30 tablet 11  . glucose blood (ACCU-CHEK AVIVA) test strip Use as instructed bid E11.65 100 each 5  . metFORMIN (GLUCOPHAGE) 500 MG tablet Take 1 tablet (500 mg total) by mouth 2 (two) times daily with a meal. 180 tablet 1  . oxyCODONE (OXY IR/ROXICODONE) 5 MG immediate release tablet Take 5 mg by mouth every 4-6 hours PRN severe pain. 28 tablet 0  . rosuvastatin (CRESTOR) 10 MG tablet TAKE ONE TABLET BY MOUTH ONCE DAILY 90 tablet 0  . sacubitril-valsartan (ENTRESTO) 49-51 MG Take 1 tablet by mouth 2 (two) times daily. 60 tablet  3  . spironolactone (ALDACTONE) 25 MG tablet Take 1 tablet (25 mg total) by mouth daily. 30 tablet 3  . warfarin (COUMADIN) 7.5 MG tablet Take 1 tablet (7.5 mg total) by mouth daily at 6 PM. 30 tablet 3   No current facility-administered medications for this encounter.    BP 118/70 Comment: standing  Pulse 95   Wt 171 lb 2 oz (77.6 kg)   SpO2 99%   BMI 23.21 kg/m  General: NAD Wife present  Neck: No JVD, no thyromegaly or thyroid nodule. Right neck large lipoma.  Lungs: Clear to auscultation bilaterally with normal respiratory effort. CV: Nondisplaced PMI.  Heart regular S1/S2, no  S3/S4, 1/6 SEM RUSB.  No peripheral edema.  No carotid bruit.  Normal pedal pulses.  Abdomen: Soft, nontender, no hepatosplenomegaly, no distention.  Skin: Intact without lesions or rashes.  Neurologic: Alert and oriented x 3.  Psych: Normal affect. Extremities: No clubbing or cyanosis.  HEENT: Normal.   Assessment/Plan: 1. Chronic systolic CHF: Ischemic cardiomyopathy.  TEE 10/17 with EF 20-25%.  Has Medtronic CRT-D system.  NYHA II. Orthostatic today . Cut back lasix to 20 mg every other day.    - Continue Entresto 49/51 bid.   - Continue current spironolactone and digoxin.   - Continue Coreg 3.125 mg bid today.  2. CAD: s/p redo CABG.  No chest pain.  - Continue statin => Crestor 10 mg daily.  - He has been referred to cardiac rehab in [place].  3. Bioprosthetic mitral valve: Stable on 10/17 TEE. 4. Atrial flutter: Back in regular rhythm. S/P DC-CV 09/08/2016.   This is decreasing is BiV pacing percentage.    - He is taking amiodarone, tolerating this better with evening dosing. Recent LFTs normal.  TSH was high => will recheck TSH along with free T3 and free T4.  Will need regular eye exam.  - S/P DC-CV on 12/8.    - Continue warfarin. Will have weekly INRs until DCCV.  5. CKD: Stage III  BMEt/  BNP today.   Follow up 4 weeks with Dr Aundra Dubin. Warren Lacy Cristin Szatkowski NP-C  09/18/2016

## 2016-09-18 NOTE — Patient Instructions (Signed)
Take Lasix 20mg  every other day.  Routine lab work today. Will notify you of abnormal results  Follow up with Dr. Aundra Dubin in 4 weeks.

## 2016-09-20 ENCOUNTER — Other Ambulatory Visit: Payer: Self-pay | Admitting: Internal Medicine

## 2016-09-20 DIAGNOSIS — Z9861 Coronary angioplasty status: Secondary | ICD-10-CM | POA: Diagnosis not present

## 2016-09-20 DIAGNOSIS — I252 Old myocardial infarction: Secondary | ICD-10-CM | POA: Diagnosis not present

## 2016-09-20 DIAGNOSIS — Z951 Presence of aortocoronary bypass graft: Secondary | ICD-10-CM | POA: Diagnosis not present

## 2016-09-20 DIAGNOSIS — Z9889 Other specified postprocedural states: Secondary | ICD-10-CM | POA: Diagnosis not present

## 2016-09-28 ENCOUNTER — Telehealth: Payer: Self-pay | Admitting: Cardiovascular Disease

## 2016-09-28 NOTE — Telephone Encounter (Signed)
New message    Pt wife calling to cancel upcoming ablation ,still wants to keep 10/26/16 appt

## 2016-09-28 NOTE — Telephone Encounter (Signed)
Spoke w/ pt's wife (ok per DPR).  Pt had DCCV earlier this month and feels great since back in NSR.  He does not want to schedule ablation at this time. Informed wife we would address how he is doing and if ablation is still needed at f/u appt on 10/26/16. She thanks me for calling and explaining.

## 2016-09-28 NOTE — Telephone Encounter (Signed)
Pt to see Dr. Curt Bears on 1/25. I don't see ablation scheduled. Routed to [facility] triage to review.

## 2016-09-29 DIAGNOSIS — I252 Old myocardial infarction: Secondary | ICD-10-CM | POA: Diagnosis not present

## 2016-09-29 DIAGNOSIS — Z9861 Coronary angioplasty status: Secondary | ICD-10-CM | POA: Diagnosis not present

## 2016-09-29 DIAGNOSIS — Z9889 Other specified postprocedural states: Secondary | ICD-10-CM | POA: Diagnosis not present

## 2016-09-29 DIAGNOSIS — Z951 Presence of aortocoronary bypass graft: Secondary | ICD-10-CM | POA: Diagnosis not present

## 2016-10-02 DIAGNOSIS — Z951 Presence of aortocoronary bypass graft: Secondary | ICD-10-CM | POA: Diagnosis not present

## 2016-10-02 DIAGNOSIS — Z9889 Other specified postprocedural states: Secondary | ICD-10-CM | POA: Diagnosis not present

## 2016-10-02 DIAGNOSIS — Z9861 Coronary angioplasty status: Secondary | ICD-10-CM | POA: Diagnosis not present

## 2016-10-02 DIAGNOSIS — I252 Old myocardial infarction: Secondary | ICD-10-CM | POA: Diagnosis not present

## 2016-10-04 ENCOUNTER — Ambulatory Visit (INDEPENDENT_AMBULATORY_CARE_PROVIDER_SITE_OTHER): Payer: Medicare Other | Admitting: *Deleted

## 2016-10-04 DIAGNOSIS — I483 Typical atrial flutter: Secondary | ICD-10-CM | POA: Diagnosis not present

## 2016-10-04 DIAGNOSIS — I252 Old myocardial infarction: Secondary | ICD-10-CM | POA: Diagnosis not present

## 2016-10-04 DIAGNOSIS — Z9889 Other specified postprocedural states: Secondary | ICD-10-CM | POA: Diagnosis not present

## 2016-10-04 DIAGNOSIS — I25810 Atherosclerosis of coronary artery bypass graft(s) without angina pectoris: Secondary | ICD-10-CM

## 2016-10-04 DIAGNOSIS — Z9861 Coronary angioplasty status: Secondary | ICD-10-CM | POA: Diagnosis not present

## 2016-10-04 DIAGNOSIS — Z5181 Encounter for therapeutic drug level monitoring: Secondary | ICD-10-CM | POA: Diagnosis not present

## 2016-10-04 DIAGNOSIS — Z953 Presence of xenogenic heart valve: Secondary | ICD-10-CM | POA: Diagnosis not present

## 2016-10-04 DIAGNOSIS — Z951 Presence of aortocoronary bypass graft: Secondary | ICD-10-CM | POA: Diagnosis not present

## 2016-10-04 LAB — POCT INR: INR: 2

## 2016-10-04 MED ORDER — WARFARIN SODIUM 5 MG PO TABS: 5.0000 mg | ORAL_TABLET | Freq: Every day | ORAL | 3 refills | Status: DC

## 2016-10-09 DIAGNOSIS — I252 Old myocardial infarction: Secondary | ICD-10-CM | POA: Diagnosis not present

## 2016-10-09 DIAGNOSIS — Z9861 Coronary angioplasty status: Secondary | ICD-10-CM | POA: Diagnosis not present

## 2016-10-09 DIAGNOSIS — Z9889 Other specified postprocedural states: Secondary | ICD-10-CM | POA: Diagnosis not present

## 2016-10-09 DIAGNOSIS — Z951 Presence of aortocoronary bypass graft: Secondary | ICD-10-CM | POA: Diagnosis not present

## 2016-10-11 DIAGNOSIS — Z9889 Other specified postprocedural states: Secondary | ICD-10-CM | POA: Diagnosis not present

## 2016-10-11 DIAGNOSIS — I252 Old myocardial infarction: Secondary | ICD-10-CM | POA: Diagnosis not present

## 2016-10-11 DIAGNOSIS — Z9861 Coronary angioplasty status: Secondary | ICD-10-CM | POA: Diagnosis not present

## 2016-10-11 DIAGNOSIS — Z951 Presence of aortocoronary bypass graft: Secondary | ICD-10-CM | POA: Diagnosis not present

## 2016-10-11 LAB — CUP PACEART INCLINIC DEVICE CHECK
Implantable Lead Implant Date: 20171024
Implantable Lead Location: 753860
Implantable Lead Model: 5076
MDC IDC LEAD IMPLANT DT: 20171024
MDC IDC LEAD IMPLANT DT: 20171024
MDC IDC LEAD LOCATION: 753858
MDC IDC LEAD LOCATION: 753859
MDC IDC PG IMPLANT DT: 20171024
MDC IDC SESS DTM: 20180110154427

## 2016-10-16 ENCOUNTER — Ambulatory Visit (HOSPITAL_COMMUNITY)
Admission: RE | Admit: 2016-10-16 | Discharge: 2016-10-16 | Disposition: A | Discharge status: Home / Self Care | Payer: Medicare Other | Source: Ambulatory Visit | Attending: Cardiology | Admitting: Cardiology

## 2016-10-16 ENCOUNTER — Ambulatory Visit (INDEPENDENT_AMBULATORY_CARE_PROVIDER_SITE_OTHER): Payer: Medicare Other | Admitting: *Deleted

## 2016-10-16 ENCOUNTER — Encounter (HOSPITAL_COMMUNITY): Payer: Self-pay

## 2016-10-16 VITALS — BP 132/78 | HR 76 | Wt 170.0 lb

## 2016-10-16 DIAGNOSIS — Z953 Presence of xenogenic heart valve: Secondary | ICD-10-CM | POA: Diagnosis not present

## 2016-10-16 DIAGNOSIS — I483 Typical atrial flutter: Secondary | ICD-10-CM | POA: Diagnosis not present

## 2016-10-16 DIAGNOSIS — Z5181 Encounter for therapeutic drug level monitoring: Secondary | ICD-10-CM | POA: Diagnosis not present

## 2016-10-16 DIAGNOSIS — Z79899 Other long term (current) drug therapy: Secondary | ICD-10-CM | POA: Diagnosis not present

## 2016-10-16 DIAGNOSIS — Z7982 Long term (current) use of aspirin: Secondary | ICD-10-CM | POA: Insufficient documentation

## 2016-10-16 DIAGNOSIS — Z7901 Long term (current) use of anticoagulants: Secondary | ICD-10-CM | POA: Insufficient documentation

## 2016-10-16 DIAGNOSIS — I255 Ischemic cardiomyopathy: Secondary | ICD-10-CM | POA: Insufficient documentation

## 2016-10-16 DIAGNOSIS — I5042 Chronic combined systolic (congestive) and diastolic (congestive) heart failure: Secondary | ICD-10-CM

## 2016-10-16 DIAGNOSIS — I25810 Atherosclerosis of coronary artery bypass graft(s) without angina pectoris: Secondary | ICD-10-CM | POA: Insufficient documentation

## 2016-10-16 DIAGNOSIS — Z7984 Long term (current) use of oral hypoglycemic drugs: Secondary | ICD-10-CM | POA: Diagnosis not present

## 2016-10-16 DIAGNOSIS — N183 Chronic kidney disease, stage 3 (moderate): Secondary | ICD-10-CM | POA: Insufficient documentation

## 2016-10-16 DIAGNOSIS — E1122 Type 2 diabetes mellitus with diabetic chronic kidney disease: Secondary | ICD-10-CM | POA: Diagnosis not present

## 2016-10-16 DIAGNOSIS — I5022 Chronic systolic (congestive) heart failure: Secondary | ICD-10-CM | POA: Diagnosis not present

## 2016-10-16 DIAGNOSIS — I4892 Unspecified atrial flutter: Secondary | ICD-10-CM | POA: Diagnosis not present

## 2016-10-16 LAB — COMPREHENSIVE METABOLIC PANEL
ALK PHOS: 73 U/L (ref 38–126)
ALT: 31 U/L (ref 17–63)
AST: 27 U/L (ref 15–41)
Albumin: 3.5 g/dL (ref 3.5–5.0)
Anion gap: 10 (ref 5–15)
BUN: 33 mg/dL — ABNORMAL HIGH (ref 6–20)
CALCIUM: 9.5 mg/dL (ref 8.9–10.3)
CO2: 24 mmol/L (ref 22–32)
CREATININE: 1.65 mg/dL — AB (ref 0.61–1.24)
Chloride: 104 mmol/L (ref 101–111)
GFR calc non Af Amer: 41 mL/min — ABNORMAL LOW (ref >60)
GFR, EST AFRICAN AMERICAN: 48 mL/min — AB (ref >60)
Glucose, Bld: 148 mg/dL — ABNORMAL HIGH (ref 65–99)
Potassium: 4.6 mmol/L (ref 3.5–5.1)
Sodium: 138 mmol/L (ref 135–145)
Total Bilirubin: 0.7 mg/dL (ref 0.3–1.2)
Total Protein: 7.3 g/dL (ref 6.5–8.1)

## 2016-10-16 LAB — T4, FREE: FREE T4: 0.96 ng/dL (ref 0.61–1.12)

## 2016-10-16 LAB — POCT INR: INR: 2.4

## 2016-10-16 LAB — TSH: TSH: 10.485 u[IU]/mL — ABNORMAL HIGH (ref 0.350–4.500)

## 2016-10-16 LAB — BRAIN NATRIURETIC PEPTIDE: B Natriuretic Peptide: 3654.9 pg/mL — ABNORMAL HIGH (ref 0.0–100.0)

## 2016-10-16 MED ORDER — CARVEDILOL 6.25 MG PO TABS: 6.2500 mg | ORAL_TABLET | Freq: Two times a day (BID) | ORAL | 3 refills | Status: DC

## 2016-10-16 NOTE — Progress Notes (Signed)
PCP: Dr Hilma Favors Cardiology: Dr. Sallyanne Kuster HF Cardiology: Dr. Aundra Dubin  71 yo with history of CAD s/p CABG in 2007 then redo CABG in 10/17 with mitral valve replacement presents for cardiology followup.  He was admitted in 10/17 for redo CABG with SVG-PDA and SVG-ramus.  He also had mitral valve replacement with a bioprosthetic valve because of infarct-related mitral regurgitation.  His EF on last study (10/17 TEE) was 20-25%.   Post-operative course was complicated by CHF requiring diuresis.  He also had atrial flutter and required DCCV.  Due to complete heart block, he later got a CRT-D system.    He is now back home with his wife in [place].  At a prior visit, he was in atrial flutter.  He saw Dr. Curt Bears, it was decided to arrange for DCCV with ablation down the road when PPM leads have been in longer.  He had successful DCCV in 12/17 and is in NSR today.   Overall, he seems to be making steady improvement.  Stamina is getting better.  No dyspnea walking on flat ground or up a flight of steps.  No orthopnea/PND, no palpitations, no chest pain.  He had an episode last night of chest pain and nausea/vomiting.  He has had a couple of these episodes since discharge from the hospital.  They are infrequent.  He is doing cardiac rehab at [facility] in [place].  He exercises on a treadmill.  Weight is down 1 lb.   ECG: NSR, BiV paced  Optivol: Fluid index < threshold, impedance stable.  >99% BiV pacing, no atrial fibrillation/flutter, no VT.   Labs (11/17): K 4.8, creatinine 1.89, hgb 12.3, digoxin 0.9, LFTs normal, TSH 8.235 (mild increase), free T3 low, free T4 normal, LFTs normal.  Labs (12/17): K 4.5, creatinine 1.7, BNP 3909  PMH: 1. CAD: CABG 2007.  - LHC (9/17) with patent LIMA-LAD, totally occluded SVG-D, severe stenosis in SVG-PDA.  - Redo CABG 10/17 with SVG-PDA, SVG-ramus and mitral valve replacement.  2. Mitral regurgitation: Ischemic MR, mitral valve replacement was done in 10/17  (bioprosthetic). 3. Complete heart block: Post-op in 10/17.  Medtronic CRT-D placed.  4. Atrial flutter: DCCV 10/17 and again in 12/17.  5. Type II diabetes 6. Hyperlipidemia 7. Chronic systolic CHF: Ischemic cardiomyopathy.   - TEE (10/17): EF 20-25%, severe LV dilation,  SH: Married, retired, lives in [place], nonsmoker, no ETOH.   Family History  Problem Relation Age of Onset  . Heart attack Mother   . Heart disease Mother   . Heart attack Father   . Heart disease Father   . Hypertension Father   . Hypertension Sister   . Heart disease Maternal Grandmother    ROS: All systems reviewed and negative except as per HPI.   Current Outpatient Prescriptions  Medication Sig Dispense Refill  . amiodarone (PACERONE) 200 MG tablet Take 1 tablet (200 mg total) by mouth at bedtime. 30 tablet 3  . aspirin EC 81 MG tablet Take 81 mg by mouth every evening.    . carvedilol (COREG) 6.25 MG tablet Take 1 tablet (6.25 mg total) by mouth 2 (two) times daily. 60 tablet 3  . digoxin (LANOXIN) 0.125 MG tablet Take 0.5 tablets (0.0625 mg total) by mouth daily. 30 tablet 1  . furosemide (LASIX) 20 MG tablet Take 1 tablet (20 mg total) by mouth every other day. 30 tablet 11  . glucose blood (ACCU-CHEK AVIVA) test strip Use as instructed bid E11.65 100 each 5  . metFORMIN (  GLUCOPHAGE) 500 MG tablet Take 1 tablet (500 mg total) by mouth 2 (two) times daily with a meal. 180 tablet 1  . oxyCODONE (OXY IR/ROXICODONE) 5 MG immediate release tablet Take 5 mg by mouth every 4-6 hours PRN severe pain. 28 tablet 0  . rosuvastatin (CRESTOR) 10 MG tablet TAKE ONE TABLET BY MOUTH ONCE DAILY 90 tablet 0  . sacubitril-valsartan (ENTRESTO) 49-51 MG Take 1 tablet by mouth 2 (two) times daily. 60 tablet 3  . spironolactone (ALDACTONE) 25 MG tablet Take 1 tablet (25 mg total) by mouth daily. 30 tablet 3  . warfarin (COUMADIN) 5 MG tablet Take 1 tablet (5 mg total) by mouth daily. 45 tablet 3   No current  facility-administered medications for this encounter.    BP 132/78 (BP Location: Left Arm, Patient Position: Sitting, Cuff Size: Normal)   Pulse 76   Wt 170 lb (77.1 kg)   SpO2 98%   BMI 23.06 kg/m  General: NAD Neck: No JVD, no thyromegaly or thyroid nodule. Right neck large lipoma.  Lungs: Clear to auscultation bilaterally with normal respiratory effort. CV: Nondisplaced PMI.  Heart regular S1/S2, no S3/S4, 1/6 SEM RUSB.  No peripheral edema.  No carotid bruit.  Normal pedal pulses.  Abdomen: Soft, nontender, no hepatosplenomegaly, no distention.  Skin: Intact without lesions or rashes.  Neurologic: Alert and oriented x 3.  Psych: Normal affect. Extremities: No clubbing or cyanosis.  HEENT: Normal.   Assessment/Plan: 1. Chronic systolic CHF: Ischemic cardiomyopathy.  TEE 10/17 with EF 20-25%.  Has Medtronic CRT-D system.  On exam today, he is not volume overloaded, NYHA class II symptoms.  Optivol also does not suggest volume overload.  BNP has been quite high but does not match symptoms, exam, or Optivol.    - Continue Lasix 20 mg daily, BMET/BNP today.   - Continue Entresto 49/51 bid.   - Continue current spironolactone and digoxin.  Check digoxin level.  - Increase Coreg to 6.25 mg bid.  - Repeat echo in a couple of months at followup appt.   2. CAD: s/p redo CABG.  No chest pain.  - Continue statin => Crestor 10 mg daily.  - Continue cardiac rehab.   3. Bioprosthetic mitral valve: Stable on 10/17 TEE. 4. Atrial flutter: Paroxysmal, he is in NSR.    - Continue amiodarone until he has had atrial flutter ablation.  Needs regular eye exam.  Check LFTs, TSH, free T3/T4 today.  He may be developing hypothyroidism and may need to be treated.  - Saw Dr. Curt Bears with plan for atrial flutter ablation.  He does not want to do this yet (does not want to go back in hospital).  I encouraged him to have this done so we can get him off amiodarone. - Continue warfarin.  5. CKD: Stage III.   BMET today.   Echo + followup in 2 months.    Loralie Champagne 10/16/2016

## 2016-10-16 NOTE — Patient Instructions (Signed)
INCREASE Carvedilol (Coreg) to 6.25 mg twice daily. Can "double up" on the 3.125 mg tablets you have at home (Take 2 tabs twice daily). New Rx for 6.25 mg tablets has been sent to your pharmacy (Take 1 tab twice daily).  Routine lab work today. Will notify you of abnormal results, otherwise no news is good news!  Follow up 2 months with Dr. Aundra Dubin and echocardiogram.  Do the following things EVERYDAY: 1) Weigh yourself in the morning before breakfast. Write it down and keep it in a log. 2) Take your medicines as prescribed 3) Eat low salt foods-Limit salt (sodium) to 2000 mg per day.  4) Stay as active as you can everyday 5) Limit all fluids for the day to less than 2 liters

## 2016-10-17 ENCOUNTER — Telehealth (HOSPITAL_COMMUNITY): Payer: Self-pay | Admitting: *Deleted

## 2016-10-17 LAB — T3, FREE: T3, Free: 1.8 pg/mL — ABNORMAL LOW (ref 2.0–4.4)

## 2016-10-17 NOTE — Telephone Encounter (Signed)
Notes Recorded by Kennieth Rad, RN on 10/17/2016 at 8:55 AM EST Will Fax results to Dr. Hilma Favors. ------  Notes Recorded by Larey Dresser, MD on 10/16/2016 at 10:11 PM EST Normal free T4 with elevated TSH, subclinical hypothyroidism. Forward to PCP, will need to consider treatment with low dose levothyroxine if any progression.

## 2016-10-23 DIAGNOSIS — Z9889 Other specified postprocedural states: Secondary | ICD-10-CM | POA: Diagnosis not present

## 2016-10-23 DIAGNOSIS — I252 Old myocardial infarction: Secondary | ICD-10-CM | POA: Diagnosis not present

## 2016-10-23 DIAGNOSIS — Z9861 Coronary angioplasty status: Secondary | ICD-10-CM | POA: Diagnosis not present

## 2016-10-23 DIAGNOSIS — Z951 Presence of aortocoronary bypass graft: Secondary | ICD-10-CM | POA: Diagnosis not present

## 2016-10-23 NOTE — Progress Notes (Signed)
Thanks, [employer]

## 2016-10-25 DIAGNOSIS — Z9889 Other specified postprocedural states: Secondary | ICD-10-CM | POA: Diagnosis not present

## 2016-10-25 DIAGNOSIS — Z9861 Coronary angioplasty status: Secondary | ICD-10-CM | POA: Diagnosis not present

## 2016-10-25 DIAGNOSIS — I252 Old myocardial infarction: Secondary | ICD-10-CM | POA: Diagnosis not present

## 2016-10-25 DIAGNOSIS — Z951 Presence of aortocoronary bypass graft: Secondary | ICD-10-CM | POA: Diagnosis not present

## 2016-10-26 ENCOUNTER — Ambulatory Visit (INDEPENDENT_AMBULATORY_CARE_PROVIDER_SITE_OTHER): Payer: Medicare Other | Admitting: Cardiology

## 2016-10-26 ENCOUNTER — Encounter: Payer: Self-pay | Admitting: Cardiology

## 2016-10-26 VITALS — BP 140/82 | HR 68 | Ht 72.0 in | Wt 173.4 lb

## 2016-10-26 DIAGNOSIS — I5042 Chronic combined systolic (congestive) and diastolic (congestive) heart failure: Secondary | ICD-10-CM

## 2016-10-26 DIAGNOSIS — Z95810 Presence of automatic (implantable) cardiac defibrillator: Secondary | ICD-10-CM | POA: Diagnosis not present

## 2016-10-26 DIAGNOSIS — I25810 Atherosclerosis of coronary artery bypass graft(s) without angina pectoris: Secondary | ICD-10-CM

## 2016-10-26 LAB — CUP PACEART INCLINIC DEVICE CHECK
Battery Remaining Longevity: 88 mo
Battery Voltage: 3.03 V
Brady Statistic AP VP Percent: 2.53 %
Brady Statistic RA Percent Paced: 2.55 %
Brady Statistic RV Percent Paced: 94.1 %
HIGH POWER IMPEDANCE MEASURED VALUE: 52 Ohm
Implantable Lead Implant Date: 20171024
Implantable Lead Implant Date: 20171024
Implantable Lead Location: 753858
Implantable Lead Location: 753860
Implantable Lead Model: 4598
Implantable Lead Model: 5076
Implantable Pulse Generator Implant Date: 20171024
Lead Channel Impedance Value: 160.941
Lead Channel Impedance Value: 171 Ohm
Lead Channel Impedance Value: 175.622
Lead Channel Impedance Value: 304 Ohm
Lead Channel Impedance Value: 304 Ohm
Lead Channel Impedance Value: 342 Ohm
Lead Channel Impedance Value: 361 Ohm
Lead Channel Impedance Value: 399 Ohm
Lead Channel Impedance Value: 532 Ohm
Lead Channel Impedance Value: 532 Ohm
Lead Channel Impedance Value: 551 Ohm
Lead Channel Impedance Value: 589 Ohm
Lead Channel Pacing Threshold Amplitude: 0.75 V
Lead Channel Pacing Threshold Amplitude: 0.75 V
Lead Channel Pacing Threshold Amplitude: 1 V
Lead Channel Pacing Threshold Pulse Width: 0.4 ms
Lead Channel Sensing Intrinsic Amplitude: 1.875 mV
Lead Channel Setting Pacing Amplitude: 1.5 V
Lead Channel Setting Pacing Amplitude: 2 V
Lead Channel Setting Pacing Pulse Width: 0.4 ms
MDC IDC LEAD IMPLANT DT: 20171024
MDC IDC LEAD LOCATION: 753859
MDC IDC MSMT LEADCHNL LV IMPEDANCE VALUE: 160.941
MDC IDC MSMT LEADCHNL LV IMPEDANCE VALUE: 165.029
MDC IDC MSMT LEADCHNL LV IMPEDANCE VALUE: 342 Ohm
MDC IDC MSMT LEADCHNL LV IMPEDANCE VALUE: 475 Ohm
MDC IDC MSMT LEADCHNL LV IMPEDANCE VALUE: 551 Ohm
MDC IDC MSMT LEADCHNL LV PACING THRESHOLD PULSEWIDTH: 1 ms
MDC IDC MSMT LEADCHNL RA PACING THRESHOLD PULSEWIDTH: 0.4 ms
MDC IDC MSMT LEADCHNL RV IMPEDANCE VALUE: 418 Ohm
MDC IDC MSMT LEADCHNL RV SENSING INTR AMPL: 7.75 mV
MDC IDC SESS DTM: 20180125165751
MDC IDC SET LEADCHNL LV PACING PULSEWIDTH: 1 ms
MDC IDC SET LEADCHNL RV PACING AMPLITUDE: 2 V
MDC IDC SET LEADCHNL RV SENSING SENSITIVITY: 0.3 mV
MDC IDC STAT BRADY AP VS PERCENT: 0.02 %
MDC IDC STAT BRADY AS VP PERCENT: 97.31 %
MDC IDC STAT BRADY AS VS PERCENT: 0.14 %

## 2016-10-26 NOTE — Progress Notes (Signed)
Electrophysiology Office Note   Date:  10/26/2016   ID:  Christian Bowman, DOB [birth year], MRN [id number]  PCP:  Purvis Kilts, MD  Cardiologist:  Aundra Dubin Primary Electrophysiologist:  Eshika Reckart Meredith Leeds, MD    Chief Complaint  Patient presents with  . Pacemaker Check    AFlutter     History of Present Illness: Christian Bowman is a 71 y.o. male who presents today for electrophysiology evaluation.   History of CAD s/p CABG in 2007 then redo CABG in 10/17 with mitral valve replacement. He was admitted in 10/17 for redo CABG with SVG-PDA and SVG-ramus.  He also had mitral valve replacement with a bioprosthetic valve because of infarct-related mitral regurgitation.  His EF on last study (10/17 TEE) was 20-25%.   Post-operative course was complicated by CHF requiring diuresis.  He also had atrial flutter and required DCCV.  Due to complete heart block, he got a CRT-D system.    Today, he denies symptoms of palpitations, chest pain, orthopnea, PND, lower extremity edema, claudication, dizziness, presyncope, syncope, bleeding, or neurologic sequela. The patient is tolerating medications without difficulties.  He has been going to cardiac rehabilitation and feeling much better.   Past Medical History:  Diagnosis Date  . Anginal pain ([place])   . CAD (coronary artery disease)   . Coronary artery disease involving coronary bypass graft   . Cyst of neck    right side  . DM2 (diabetes mellitus, type 2) ([place]) 08/26/2013  . Dyspnea   . Heart attack   . HTN (hypertension) 08/26/2013  . Hyperlipidemia 08/26/2013  . Left main coronary artery disease   . Left renal artery stenosis ([place])    Genesis 6x12 stent 2007  . Obesity (BMI 30.0-34.9) 08/26/2013  . Postoperative atrial fibrillation ([place]) 10/15/2005  . S/P CABG x 4 10/13/2005   LIMA to LAD, SVG to intermediate branch, sequential SVG to PDA and RPL branch, EVH via right thigh  . S/P mitral valve replacement with bioprosthetic valve  07/11/2016   31 mm [facility] Mitral bovine bioprosthetic tissue valve  . S/P redo CABG x 2 07/11/2016   SVG to PDA and SVG to Intermediate Branch, EVH via left thigh   Past Surgical History:  Procedure Laterality Date  . CARDIAC CATHETERIZATION N/A 06/21/2016   Procedure: Right/Left Heart Cath and Coronary/Graft Angiography;  Surgeon: Sherren Mocha, MD;  Location: [facility] CV LAB;  Service: Cardiovascular;  Laterality: N/A;  . CARDIOVERSION N/A 07/19/2016   Procedure: CARDIOVERSION;  Surgeon: Lelon Perla, MD;  Location: [facility] ENDOSCOPY;  Service: Cardiovascular;  Laterality: N/A;  . CARDIOVERSION N/A 09/08/2016   Procedure: CARDIOVERSION;  Surgeon: Fay Records, MD;  Location: [facility] ENDOSCOPY;  Service: Cardiovascular;  Laterality: N/A;  . CORONARY ARTERY BYPASS GRAFT  10/13/2005   LIMA to LAD, SVG to intermediate branch, sequential SVG to PDA and RPL  . CORONARY ARTERY BYPASS GRAFT N/A 07/11/2016   Procedure: REDO CORONARY ARTERY BYPASS GRAFTING (CABG) x two using left leg greater saphenous vein harvested endoscopically-SVG to PDA -SVG to RAMUS INTERMEDIATE;  Surgeon: Rexene Alberts, MD;  Location: [facility];  Service: Open Heart Surgery;  Laterality: N/A;  . CORONARY ARTERY BYPASS GRAFT N/A 07/11/2016   Procedure: Re-exploration (CABG) for post op bleeding,;  Surgeon: Rexene Alberts, MD;  Location: [facility];  Service: Open Heart Surgery;  Laterality: N/A;  . EP IMPLANTABLE DEVICE N/A 07/25/2016   Procedure: BiV ICD Insertion CRT-D;  Surgeon: Emilija Bohman Meredith Leeds, MD;  Location: [facility] CV LAB;  Service: Cardiovascular;  Laterality: N/A;  . MITRAL VALVE REPLACEMENT N/A 07/11/2016   Procedure: MITRAL VALVE (MV) REPLACEMENT;  Surgeon: Rexene Alberts, MD;  Location: [facility];  Service: Open Heart Surgery;  Laterality: N/A;  . MYOCARDICAL PERFUSION  10/09/2007   NORMAL PERFUSION IN ALL REGIONS;NO EVIDENCE OF INDUCIBLE ISCHEMIA;POST STRESS EF% 66  . PERCUTANEOUS CORONARY STENT INTERVENTION  (PCI-S)     DES in SVG to right coronary artery system  . RENAL ARTERY STENT Right 2007  . RENAL DOPPLER  03/28/2010   RIGHT RA-NORMAL;LEFT PROXIMAL RA AT STENT-PATENT WITH NO EVIDENCE OF SIGN DIAMETER REDUCTION. R & L KIDNEYS: EQUAL IN SIZE,SYMMETRICAL IN SHAPE.  . TEE WITHOUT CARDIOVERSION N/A 06/15/2016   Procedure: TRANSESOPHAGEAL ECHOCARDIOGRAM (TEE);  Surgeon: Sanda Klein, MD;  Location: [facility] ENDOSCOPY;  Service: Cardiovascular;  Laterality: N/A;  . TEE WITHOUT CARDIOVERSION N/A 07/11/2016   Procedure: TRANSESOPHAGEAL ECHOCARDIOGRAM (TEE);  Surgeon: Rexene Alberts, MD;  Location: [facility];  Service: Open Heart Surgery;  Laterality: N/A;  . TEE WITHOUT CARDIOVERSION N/A 07/19/2016   Procedure: TRANSESOPHAGEAL ECHOCARDIOGRAM (TEE);  Surgeon: Lelon Perla, MD;  Location: [facility] ENDOSCOPY;  Service: Cardiovascular;  Laterality: N/A;  . TRANSESOPHAGEAL ECHOCARDIOGRAM  10/19/2005   NORMAL LV; MILD TO MODERATE AMOUNT OF SOFT ATHEROMATOUS PLAQUE OF THE THORACIC AORTA; THE LEFT ATRIUM IS MILDLY DILATED;LEFT ATRIAL APPENDAGE FUNCTION IS NORMAL;NO THROMBUS IDENTIFIED. SMALL PFO WITH RIGHT TO LEFT SHUNT     Current Outpatient Prescriptions  Medication Sig Dispense Refill  . amiodarone (PACERONE) 200 MG tablet Take 1 tablet (200 mg total) by mouth at bedtime. 30 tablet 3  . aspirin EC 81 MG tablet Take 81 mg by mouth every evening.    . carvedilol (COREG) 6.25 MG tablet Take 1 tablet (6.25 mg total) by mouth 2 (two) times daily. 60 tablet 3  . digoxin (LANOXIN) 0.125 MG tablet Take 0.5 tablets (0.0625 mg total) by mouth daily. 30 tablet 1  . furosemide (LASIX) 20 MG tablet Take 1 tablet (20 mg total) by mouth every other day. 30 tablet 11  . glucose blood (ACCU-CHEK AVIVA) test strip Use as instructed bid E11.65 100 each 5  . metFORMIN (GLUCOPHAGE) 500 MG tablet Take 1 tablet (500 mg total) by mouth 2 (two) times daily with a meal. 180 tablet 1  . rosuvastatin (CRESTOR) 10 MG tablet TAKE ONE TABLET BY  MOUTH ONCE DAILY 90 tablet 0  . sacubitril-valsartan (ENTRESTO) 49-51 MG Take 1 tablet by mouth 2 (two) times daily. 60 tablet 3  . spironolactone (ALDACTONE) 25 MG tablet Take 1 tablet (25 mg total) by mouth daily. 30 tablet 3  . warfarin (COUMADIN) 5 MG tablet Take 1 tablet (5 mg total) by mouth daily. 45 tablet 3   No current facility-administered medications for this visit.     Allergies:   Xanax [alprazolam]   Social History:  The patient  reports that he has never smoked. He has never used smokeless tobacco. He reports that he does not drink alcohol or use drugs.   Family History:  The patient's family history includes Heart attack in his father and mother; Heart disease in his father, maternal grandmother, and mother; Hypertension in his father and sister.    ROS:  Please see the history of present illness.   Otherwise, review of systems is positive for Chest pain, abdominal pain, dizziness.   All other systems are reviewed and negative.    PHYSICAL EXAM: VS:  BP 140/82  Pulse 68   Ht 6' (1.829 m)   Wt 173 lb 6.4 oz (78.7 kg)   BMI 23.52 kg/m  , BMI Body mass index is 23.52 kg/m. GEN: Well nourished, well developed, in no acute distress  HEENT: normal  Neck: no JVD, carotid bruits, or masses Cardiac: RRR; no murmurs, rubs, or gallops,no edema  Respiratory:  clear to auscultation bilaterally, normal work of breathing GI: soft, nontender, nondistended, + BS MS: no deformity or atrophy  Skin: warm and dry,  device pocket is well healed Neuro:  Strength and sensation are intact Psych: euthymic mood, full affect  EKG:  EKG is ordered today. Personal review of the ekg ordered 10/16/16 shows A sense, V pace   Device interrogation is reviewed today in detail.  See PaceArt for details.   Recent Labs: 07/17/2016: Magnesium 2.1 09/04/2016: Hemoglobin 13.6; Platelets 167 10/16/2016: ALT 31; B Natriuretic Peptide 3,654.9; BUN 33; Creatinine, Ser 1.65; Potassium 4.6; Sodium 138;  TSH 10.485    Lipid Panel     Component Value Date/Time   CHOL 95 (L) 04/20/2016 0805   TRIG 99 07/17/2016 0335   HDL 28 (L) 04/20/2016 0805   CHOLHDL 3.4 04/20/2016 0805   LDLCALC 45 04/20/2016 0805     Wt Readings from Last 3 Encounters:  10/26/16 173 lb 6.4 oz (78.7 kg)  10/16/16 170 lb (77.1 kg)  09/18/16 171 lb 2 oz (77.6 kg)      Other studies Reviewed: Additional studies/ records that were reviewed today include: TEE 07/19/16  Review of the above records today demonstrates:  - Left ventricle: The cavity size was severely dilated. Systolic   function was severely reduced. The estimated ejection fraction   was in the range of 20% to 25%. Diffuse hypokinesis. - Aortic valve: No evidence of vegetation. - Mitral valve: A bioprosthesis was present. - Left atrium: The atrium was moderately dilated. No evidence of   thrombus in the atrial cavity or appendage. - Right ventricle: Systolic function was moderately reduced. - Right atrium: No evidence of thrombus in the atrial cavity or   appendage. - Atrial septum: No defect or patent foramen ovale was identified. - Tricuspid valve: No evidence of vegetation. There was   mild-moderate regurgitation. - Pulmonic valve: No evidence of vegetation.   ASSESSMENT AND PLAN:  1.  Atrial flutter: In sinus rhythm today. He is on amiodarone and warfarin for anticoagulation. Discussed with him the option of getting off of amiodarone and ablation. Risks and benefits of ablation were discussed. Risks include bleeding, tamponade, heart block, stroke. He says that he would prefer to think about this and Christian Bowman call us back.  This patients CHA2DS2-VASc Score and unadjusted Ischemic Stroke Rate (% per year) is equal to 4.8 % stroke rate/year from a score of 4  Above score calculated as 1 point each if present [CHF, HTN, DM, Vascular=MI/PAD/Aortic Plaque, Age if 65-74, or Male] Above score calculated as 2 points each if present [Age > 75, or  Stroke/TIA/TE]  2. Complete AV block: s/p CRT-D  3. Chronic systolic heart failure: on optimal medical therapy  4. Mitral regurgitation: s/p redo MVR  5. CAD: s/p redo CABG. No recurrent chest pain     Current medicines are reviewed at length with the patient today.   The patient does not have concerns regarding his medicines.  The following changes were made today:  none  Labs/ tests ordered today include:  No orders of the defined types were placed in this encounter.  Disposition:   FU with Christian Bowman 9 months  Signed, Khamani Fairley Meredith Leeds, MD  10/26/2016 12:23 PM     [address] [place] [phone] (office) [phone] (fax)

## 2016-10-26 NOTE — Patient Instructions (Addendum)
Medication Instructions:    Your physician recommends that you continue on your current medications as directed. Please refer to the Current Medication list given to you today.  --- If you need a refill on your cardiac medications before your next appointment, please call your pharmacy. ---  Labwork:  None ordered  Testing/Procedures:  None ordered  Follow-Up: Remote monitoring is used to monitor your Pacemaker of ICD from home. This monitoring reduces the number of office visits required to check your device to one time per year. It allows [place] to keep an eye on the functioning of your device to ensure it is working properly. You are scheduled for a device check from home on 01/25/2017. You may send your transmission at any time that day. If you have a wireless device, the transmission will be sent automatically. After your physician reviews your transmission, you will receive a postcard with your next transmission date.   Your physician wants you to follow-up in: 9 months with Dr. Curt Bears.  You will receive a reminder letter in the mail two months in advance. If you don't receive a letter, please call our office to schedule the follow-up appointment.  Thank you for choosing [facility]!!   Trinidad Curet, RN [phone]  Any Other Special Instructions Will Be Listed Below (If Applicable).   Cardiac Ablation Cardiac ablation is a procedure to stop some heart tissue from causing problems. The heart has many electrical connections. Sometimes these connections cause the heart to beat very fast or irregularly. Removing some of the problem areas can improve heart rhythm or make it normal. Ablation is done for people who:  Have Wolff-Parkinson-White syndrome.  Have other fast heart rhythms (tachycardia).  Have taken medicines for an abnormal heart rhythm (arrhythmia) and the medicines had:  No success.  Side effects.  May have a type of heartbeat that could cause death. What  happens before the procedure?  Follow instructions from your doctor about eating and drinking before the procedure.  Take your medicines as told by your doctor. Take them at regular times with water unless told differently by your doctor.  If you are taking diabetes medicine, ask your doctor how to take it. Ask if there are any special instructions you should follow. Your doctor may change how much insulin you take the day of the procedure. What happens during the procedure?  A special type of X-ray will be used. The X-ray helps your doctor see images of your heart during the procedure.  A small cut (incision) will be made in your neck or groin.  An IV tube will be started before the procedure begins.  You will be given a numbing medicine (anesthetic) or a medicine to help you relax (sedative).  The skin on your neck or groin will be numbed.  A needle will be put into a large vein in your neck or groin.  A thin, flexible tube (catheter) will be put in to reach your heart.  A dye will be put in the tube. The dye will show up on X-rays. It will help your doctor see the area of the heart that needs treatment.  When the heart tissue that is causing problems is found, the tip of the tube will send an electrical current to it. This will stop it from causing problems.  The tube will be taken out.  Pressure will be put on the area where the tube was. This will keep it from bleeding. A bandage will be placed  over the area. What happens after the procedure?  You will be taken to a recovery area. Your blood pressure, heart rate, and breathing will be watched. The area where the tube was will also be watched for bleeding.  You will need to lie still for 4-6 hours. This keeps the area where the tube was from bleeding. This information is not intended to replace advice given to you by your health care provider. Make sure you discuss any questions you have with your health care  provider. Document Released: 05/21/2013 Document Revised: 02/24/2016 Document Reviewed: 02/13/2013 Elsevier Interactive Patient Education  2017 [employer].

## 2016-10-27 DIAGNOSIS — I252 Old myocardial infarction: Secondary | ICD-10-CM | POA: Diagnosis not present

## 2016-10-27 DIAGNOSIS — Z951 Presence of aortocoronary bypass graft: Secondary | ICD-10-CM | POA: Diagnosis not present

## 2016-10-27 DIAGNOSIS — Z9889 Other specified postprocedural states: Secondary | ICD-10-CM | POA: Diagnosis not present

## 2016-10-27 DIAGNOSIS — Z9861 Coronary angioplasty status: Secondary | ICD-10-CM | POA: Diagnosis not present

## 2016-10-30 ENCOUNTER — Ambulatory Visit (INDEPENDENT_AMBULATORY_CARE_PROVIDER_SITE_OTHER): Payer: Medicare Other | Admitting: *Deleted

## 2016-10-30 DIAGNOSIS — Z5181 Encounter for therapeutic drug level monitoring: Secondary | ICD-10-CM | POA: Diagnosis not present

## 2016-10-30 DIAGNOSIS — Z9861 Coronary angioplasty status: Secondary | ICD-10-CM | POA: Diagnosis not present

## 2016-10-30 DIAGNOSIS — Z9889 Other specified postprocedural states: Secondary | ICD-10-CM | POA: Diagnosis not present

## 2016-10-30 DIAGNOSIS — I483 Typical atrial flutter: Secondary | ICD-10-CM | POA: Diagnosis not present

## 2016-10-30 DIAGNOSIS — I25810 Atherosclerosis of coronary artery bypass graft(s) without angina pectoris: Secondary | ICD-10-CM

## 2016-10-30 DIAGNOSIS — Z953 Presence of xenogenic heart valve: Secondary | ICD-10-CM | POA: Diagnosis not present

## 2016-10-30 DIAGNOSIS — I252 Old myocardial infarction: Secondary | ICD-10-CM | POA: Diagnosis not present

## 2016-10-30 DIAGNOSIS — Z951 Presence of aortocoronary bypass graft: Secondary | ICD-10-CM | POA: Diagnosis not present

## 2016-10-30 LAB — POCT INR: INR: 1.9

## 2016-11-01 DIAGNOSIS — Z9861 Coronary angioplasty status: Secondary | ICD-10-CM | POA: Diagnosis not present

## 2016-11-01 DIAGNOSIS — Z951 Presence of aortocoronary bypass graft: Secondary | ICD-10-CM | POA: Diagnosis not present

## 2016-11-01 DIAGNOSIS — I252 Old myocardial infarction: Secondary | ICD-10-CM | POA: Diagnosis not present

## 2016-11-01 DIAGNOSIS — Z9889 Other specified postprocedural states: Secondary | ICD-10-CM | POA: Diagnosis not present

## 2016-11-02 DIAGNOSIS — I252 Old myocardial infarction: Secondary | ICD-10-CM | POA: Diagnosis not present

## 2016-11-02 DIAGNOSIS — Z9889 Other specified postprocedural states: Secondary | ICD-10-CM | POA: Diagnosis not present

## 2016-11-02 DIAGNOSIS — Z9861 Coronary angioplasty status: Secondary | ICD-10-CM | POA: Diagnosis not present

## 2016-11-02 DIAGNOSIS — Z951 Presence of aortocoronary bypass graft: Secondary | ICD-10-CM | POA: Diagnosis not present

## 2016-11-03 DIAGNOSIS — Z9889 Other specified postprocedural states: Secondary | ICD-10-CM | POA: Diagnosis not present

## 2016-11-03 DIAGNOSIS — Z951 Presence of aortocoronary bypass graft: Secondary | ICD-10-CM | POA: Diagnosis not present

## 2016-11-03 DIAGNOSIS — Z9861 Coronary angioplasty status: Secondary | ICD-10-CM | POA: Diagnosis not present

## 2016-11-03 DIAGNOSIS — I252 Old myocardial infarction: Secondary | ICD-10-CM | POA: Diagnosis not present

## 2016-11-07 ENCOUNTER — Other Ambulatory Visit: Payer: Self-pay | Admitting: *Deleted

## 2016-11-07 ENCOUNTER — Encounter: Payer: Self-pay | Admitting: Cardiovascular Disease

## 2016-11-07 MED ORDER — DIGOXIN 125 MCG PO TABS: 0.0625 mg | ORAL_TABLET | Freq: Every day | ORAL | 1 refills | Status: DC

## 2016-11-08 ENCOUNTER — Other Ambulatory Visit: Payer: Self-pay | Admitting: Internal Medicine

## 2016-11-08 DIAGNOSIS — I252 Old myocardial infarction: Secondary | ICD-10-CM | POA: Diagnosis not present

## 2016-11-08 DIAGNOSIS — Z951 Presence of aortocoronary bypass graft: Secondary | ICD-10-CM | POA: Diagnosis not present

## 2016-11-08 DIAGNOSIS — Z9861 Coronary angioplasty status: Secondary | ICD-10-CM | POA: Diagnosis not present

## 2016-11-08 DIAGNOSIS — Z9889 Other specified postprocedural states: Secondary | ICD-10-CM | POA: Diagnosis not present

## 2016-11-10 ENCOUNTER — Telehealth: Payer: Self-pay | Admitting: Cardiovascular Disease

## 2016-11-10 NOTE — Telephone Encounter (Addendum)
error 

## 2016-11-13 ENCOUNTER — Ambulatory Visit (INDEPENDENT_AMBULATORY_CARE_PROVIDER_SITE_OTHER): Payer: Medicare Other | Admitting: *Deleted

## 2016-11-13 DIAGNOSIS — I483 Typical atrial flutter: Secondary | ICD-10-CM

## 2016-11-13 DIAGNOSIS — Z5181 Encounter for therapeutic drug level monitoring: Secondary | ICD-10-CM

## 2016-11-13 DIAGNOSIS — Z953 Presence of xenogenic heart valve: Secondary | ICD-10-CM | POA: Diagnosis not present

## 2016-11-13 DIAGNOSIS — I25810 Atherosclerosis of coronary artery bypass graft(s) without angina pectoris: Secondary | ICD-10-CM

## 2016-11-13 DIAGNOSIS — Z9889 Other specified postprocedural states: Secondary | ICD-10-CM | POA: Diagnosis not present

## 2016-11-13 DIAGNOSIS — Z9861 Coronary angioplasty status: Secondary | ICD-10-CM | POA: Diagnosis not present

## 2016-11-13 DIAGNOSIS — I252 Old myocardial infarction: Secondary | ICD-10-CM | POA: Diagnosis not present

## 2016-11-13 DIAGNOSIS — Z951 Presence of aortocoronary bypass graft: Secondary | ICD-10-CM | POA: Diagnosis not present

## 2016-11-13 LAB — POCT INR: INR: 2.3

## 2016-11-17 ENCOUNTER — Encounter: Payer: Self-pay | Admitting: *Deleted

## 2016-11-17 ENCOUNTER — Encounter: Payer: Self-pay | Admitting: Cardiovascular Disease

## 2016-11-17 NOTE — Telephone Encounter (Signed)
Spoke with pt wife, she voiced understanding of medication change. 

## 2016-11-17 NOTE — Telephone Encounter (Signed)
Spoke with pt wife, medications confirmed. He is having the dizziness when he stands, but not every time. Unable to go to cardiac rehab due to low bp. Will forward for dr croitoru's review and advise.

## 2016-11-17 NOTE — Telephone Encounter (Signed)
This encounter was created in error - please disregard.

## 2016-11-20 ENCOUNTER — Other Ambulatory Visit: Payer: Self-pay | Admitting: Cardiovascular Disease

## 2016-11-20 ENCOUNTER — Other Ambulatory Visit: Payer: Self-pay | Admitting: Physician Assistant

## 2016-11-21 ENCOUNTER — Other Ambulatory Visit: Payer: Self-pay | Admitting: Cardiology

## 2016-11-23 ENCOUNTER — Other Ambulatory Visit: Payer: Self-pay

## 2016-11-24 DIAGNOSIS — Z951 Presence of aortocoronary bypass graft: Secondary | ICD-10-CM | POA: Diagnosis not present

## 2016-11-24 DIAGNOSIS — I252 Old myocardial infarction: Secondary | ICD-10-CM | POA: Diagnosis not present

## 2016-11-24 DIAGNOSIS — Z9889 Other specified postprocedural states: Secondary | ICD-10-CM | POA: Diagnosis not present

## 2016-11-24 DIAGNOSIS — Z9861 Coronary angioplasty status: Secondary | ICD-10-CM | POA: Diagnosis not present

## 2016-11-27 DIAGNOSIS — Z9861 Coronary angioplasty status: Secondary | ICD-10-CM | POA: Diagnosis not present

## 2016-11-27 DIAGNOSIS — I252 Old myocardial infarction: Secondary | ICD-10-CM | POA: Diagnosis not present

## 2016-11-27 DIAGNOSIS — Z9889 Other specified postprocedural states: Secondary | ICD-10-CM | POA: Diagnosis not present

## 2016-11-27 DIAGNOSIS — Z951 Presence of aortocoronary bypass graft: Secondary | ICD-10-CM | POA: Diagnosis not present

## 2016-11-29 DIAGNOSIS — I252 Old myocardial infarction: Secondary | ICD-10-CM | POA: Diagnosis not present

## 2016-11-29 DIAGNOSIS — Z9861 Coronary angioplasty status: Secondary | ICD-10-CM | POA: Diagnosis not present

## 2016-11-29 DIAGNOSIS — Z951 Presence of aortocoronary bypass graft: Secondary | ICD-10-CM | POA: Diagnosis not present

## 2016-11-29 DIAGNOSIS — Z9889 Other specified postprocedural states: Secondary | ICD-10-CM | POA: Diagnosis not present

## 2016-11-30 DIAGNOSIS — I252 Old myocardial infarction: Secondary | ICD-10-CM | POA: Diagnosis not present

## 2016-11-30 DIAGNOSIS — Z951 Presence of aortocoronary bypass graft: Secondary | ICD-10-CM | POA: Diagnosis not present

## 2016-11-30 DIAGNOSIS — Z9861 Coronary angioplasty status: Secondary | ICD-10-CM | POA: Diagnosis not present

## 2016-11-30 DIAGNOSIS — Z9889 Other specified postprocedural states: Secondary | ICD-10-CM | POA: Diagnosis not present

## 2016-12-01 DIAGNOSIS — Z9889 Other specified postprocedural states: Secondary | ICD-10-CM | POA: Diagnosis not present

## 2016-12-01 DIAGNOSIS — I252 Old myocardial infarction: Secondary | ICD-10-CM | POA: Diagnosis not present

## 2016-12-01 DIAGNOSIS — Z951 Presence of aortocoronary bypass graft: Secondary | ICD-10-CM | POA: Diagnosis not present

## 2016-12-01 DIAGNOSIS — Z9861 Coronary angioplasty status: Secondary | ICD-10-CM | POA: Diagnosis not present

## 2016-12-04 ENCOUNTER — Ambulatory Visit (INDEPENDENT_AMBULATORY_CARE_PROVIDER_SITE_OTHER): Payer: Medicare Other | Admitting: *Deleted

## 2016-12-04 DIAGNOSIS — Z9889 Other specified postprocedural states: Secondary | ICD-10-CM | POA: Diagnosis not present

## 2016-12-04 DIAGNOSIS — I483 Typical atrial flutter: Secondary | ICD-10-CM | POA: Diagnosis not present

## 2016-12-04 DIAGNOSIS — Z5181 Encounter for therapeutic drug level monitoring: Secondary | ICD-10-CM

## 2016-12-04 DIAGNOSIS — Z951 Presence of aortocoronary bypass graft: Secondary | ICD-10-CM | POA: Diagnosis not present

## 2016-12-04 DIAGNOSIS — I252 Old myocardial infarction: Secondary | ICD-10-CM | POA: Diagnosis not present

## 2016-12-04 DIAGNOSIS — Z953 Presence of xenogenic heart valve: Secondary | ICD-10-CM | POA: Diagnosis not present

## 2016-12-04 DIAGNOSIS — I25810 Atherosclerosis of coronary artery bypass graft(s) without angina pectoris: Secondary | ICD-10-CM | POA: Diagnosis not present

## 2016-12-04 DIAGNOSIS — Z9861 Coronary angioplasty status: Secondary | ICD-10-CM | POA: Diagnosis not present

## 2016-12-04 LAB — POCT INR: INR: 2.6

## 2016-12-06 DIAGNOSIS — Z951 Presence of aortocoronary bypass graft: Secondary | ICD-10-CM | POA: Diagnosis not present

## 2016-12-06 DIAGNOSIS — I252 Old myocardial infarction: Secondary | ICD-10-CM | POA: Diagnosis not present

## 2016-12-06 DIAGNOSIS — E1159 Type 2 diabetes mellitus with other circulatory complications: Secondary | ICD-10-CM | POA: Diagnosis not present

## 2016-12-06 DIAGNOSIS — Z9889 Other specified postprocedural states: Secondary | ICD-10-CM | POA: Diagnosis not present

## 2016-12-06 DIAGNOSIS — Z9861 Coronary angioplasty status: Secondary | ICD-10-CM | POA: Diagnosis not present

## 2016-12-08 DIAGNOSIS — Z951 Presence of aortocoronary bypass graft: Secondary | ICD-10-CM | POA: Diagnosis not present

## 2016-12-08 DIAGNOSIS — I252 Old myocardial infarction: Secondary | ICD-10-CM | POA: Diagnosis not present

## 2016-12-08 DIAGNOSIS — Z9889 Other specified postprocedural states: Secondary | ICD-10-CM | POA: Diagnosis not present

## 2016-12-08 DIAGNOSIS — Z9861 Coronary angioplasty status: Secondary | ICD-10-CM | POA: Diagnosis not present

## 2016-12-10 DIAGNOSIS — I509 Heart failure, unspecified: Secondary | ICD-10-CM | POA: Diagnosis not present

## 2016-12-10 DIAGNOSIS — I255 Ischemic cardiomyopathy: Secondary | ICD-10-CM | POA: Diagnosis not present

## 2016-12-10 DIAGNOSIS — Z952 Presence of prosthetic heart valve: Secondary | ICD-10-CM | POA: Diagnosis not present

## 2016-12-10 DIAGNOSIS — Z888 Allergy status to other drugs, medicaments and biological substances status: Secondary | ICD-10-CM | POA: Diagnosis not present

## 2016-12-10 DIAGNOSIS — I4892 Unspecified atrial flutter: Secondary | ICD-10-CM | POA: Diagnosis not present

## 2016-12-10 DIAGNOSIS — I110 Hypertensive heart disease with heart failure: Secondary | ICD-10-CM | POA: Diagnosis present

## 2016-12-10 DIAGNOSIS — I088 Other rheumatic multiple valve diseases: Secondary | ICD-10-CM | POA: Diagnosis present

## 2016-12-10 DIAGNOSIS — E785 Hyperlipidemia, unspecified: Secondary | ICD-10-CM | POA: Diagnosis not present

## 2016-12-10 DIAGNOSIS — I25810 Atherosclerosis of coronary artery bypass graft(s) without angina pectoris: Secondary | ICD-10-CM | POA: Diagnosis not present

## 2016-12-10 DIAGNOSIS — I2510 Atherosclerotic heart disease of native coronary artery without angina pectoris: Secondary | ICD-10-CM | POA: Diagnosis not present

## 2016-12-10 DIAGNOSIS — I160 Hypertensive urgency: Secondary | ICD-10-CM | POA: Diagnosis not present

## 2016-12-10 DIAGNOSIS — I5023 Acute on chronic systolic (congestive) heart failure: Secondary | ICD-10-CM | POA: Diagnosis not present

## 2016-12-10 DIAGNOSIS — Z951 Presence of aortocoronary bypass graft: Secondary | ICD-10-CM | POA: Diagnosis not present

## 2016-12-10 DIAGNOSIS — Z9861 Coronary angioplasty status: Secondary | ICD-10-CM | POA: Diagnosis not present

## 2016-12-10 DIAGNOSIS — R0609 Other forms of dyspnea: Secondary | ICD-10-CM | POA: Diagnosis not present

## 2016-12-10 DIAGNOSIS — I214 Non-ST elevation (NSTEMI) myocardial infarction: Secondary | ICD-10-CM | POA: Diagnosis not present

## 2016-12-10 DIAGNOSIS — Z7984 Long term (current) use of oral hypoglycemic drugs: Secondary | ICD-10-CM | POA: Diagnosis not present

## 2016-12-10 DIAGNOSIS — E119 Type 2 diabetes mellitus without complications: Secondary | ICD-10-CM | POA: Diagnosis not present

## 2016-12-10 DIAGNOSIS — Z960 Presence of urogenital implants: Secondary | ICD-10-CM | POA: Diagnosis present

## 2016-12-10 DIAGNOSIS — J9601 Acute respiratory failure with hypoxia: Secondary | ICD-10-CM | POA: Diagnosis not present

## 2016-12-10 DIAGNOSIS — Z95810 Presence of automatic (implantable) cardiac defibrillator: Secondary | ICD-10-CM | POA: Diagnosis not present

## 2016-12-11 ENCOUNTER — Other Ambulatory Visit (HOSPITAL_COMMUNITY): Payer: Self-pay | Admitting: Cardiology

## 2016-12-11 ENCOUNTER — Ambulatory Visit: Payer: Medicare Other | Admitting: "Endocrinology

## 2016-12-11 DIAGNOSIS — I5023 Acute on chronic systolic (congestive) heart failure: Secondary | ICD-10-CM

## 2016-12-14 ENCOUNTER — Telehealth (HOSPITAL_COMMUNITY): Payer: Self-pay | Admitting: *Deleted

## 2016-12-14 DIAGNOSIS — I252 Old myocardial infarction: Secondary | ICD-10-CM | POA: Diagnosis not present

## 2016-12-14 DIAGNOSIS — Z950 Presence of cardiac pacemaker: Secondary | ICD-10-CM | POA: Diagnosis not present

## 2016-12-14 DIAGNOSIS — I10 Essential (primary) hypertension: Secondary | ICD-10-CM | POA: Diagnosis not present

## 2016-12-14 DIAGNOSIS — E119 Type 2 diabetes mellitus without complications: Secondary | ICD-10-CM | POA: Diagnosis not present

## 2016-12-14 DIAGNOSIS — R079 Chest pain, unspecified: Secondary | ICD-10-CM | POA: Diagnosis not present

## 2016-12-14 DIAGNOSIS — Z7901 Long term (current) use of anticoagulants: Secondary | ICD-10-CM | POA: Diagnosis not present

## 2016-12-14 DIAGNOSIS — R0789 Other chest pain: Secondary | ICD-10-CM | POA: Diagnosis not present

## 2016-12-14 DIAGNOSIS — Z794 Long term (current) use of insulin: Secondary | ICD-10-CM | POA: Diagnosis not present

## 2016-12-14 DIAGNOSIS — I214 Non-ST elevation (NSTEMI) myocardial infarction: Secondary | ICD-10-CM | POA: Diagnosis not present

## 2016-12-14 DIAGNOSIS — I110 Hypertensive heart disease with heart failure: Secondary | ICD-10-CM | POA: Diagnosis not present

## 2016-12-14 DIAGNOSIS — E7800 Pure hypercholesterolemia, unspecified: Secondary | ICD-10-CM | POA: Diagnosis not present

## 2016-12-14 DIAGNOSIS — I25810 Atherosclerosis of coronary artery bypass graft(s) without angina pectoris: Secondary | ICD-10-CM | POA: Diagnosis not present

## 2016-12-14 DIAGNOSIS — I509 Heart failure, unspecified: Secondary | ICD-10-CM | POA: Diagnosis not present

## 2016-12-14 DIAGNOSIS — Z888 Allergy status to other drugs, medicaments and biological substances status: Secondary | ICD-10-CM | POA: Diagnosis not present

## 2016-12-14 DIAGNOSIS — Z955 Presence of coronary angioplasty implant and graft: Secondary | ICD-10-CM | POA: Diagnosis not present

## 2016-12-14 DIAGNOSIS — Z7982 Long term (current) use of aspirin: Secondary | ICD-10-CM | POA: Diagnosis not present

## 2016-12-14 NOTE — Telephone Encounter (Signed)
Pt's wife called in stating he had to go to the emergency room in [place] this past weekend and he had positive troponin and echo and stress test performed.  She said I needed to fax their medical record department requesting for his records from visit.  She wants Dr. Aundra Dubin to let them know if they should cancel his echo they have scheduled for next week.  I told her I would request the records and send Dr. Aundra Dubin this message and will call her back with his response.   Records request faxed to [phone].

## 2016-12-14 NOTE — Telephone Encounter (Signed)
He has appointment with you next Tuesday, will cancel echo appointment. Medical records request has already been faxed today.

## 2016-12-14 NOTE — Telephone Encounter (Signed)
We need to see him soon.  Please get echo and stress test report from [facility] and he will not have to repeat echo here.

## 2016-12-15 ENCOUNTER — Inpatient Hospital Stay (HOSPITAL_COMMUNITY)
Admission: AD | Admit: 2016-12-15 | Discharge: 2016-12-19 | DRG: 281 | Disposition: A | Discharge status: Home / Self Care | Payer: Medicare Other | Source: Other Acute Inpatient Hospital | Attending: Cardiovascular Disease | Admitting: Cardiovascular Disease

## 2016-12-15 DIAGNOSIS — I25810 Atherosclerosis of coronary artery bypass graft(s) without angina pectoris: Secondary | ICD-10-CM | POA: Diagnosis present

## 2016-12-15 DIAGNOSIS — I2510 Atherosclerotic heart disease of native coronary artery without angina pectoris: Secondary | ICD-10-CM | POA: Diagnosis present

## 2016-12-15 DIAGNOSIS — I442 Atrioventricular block, complete: Secondary | ICD-10-CM | POA: Diagnosis not present

## 2016-12-15 DIAGNOSIS — Z951 Presence of aortocoronary bypass graft: Secondary | ICD-10-CM | POA: Diagnosis not present

## 2016-12-15 DIAGNOSIS — I255 Ischemic cardiomyopathy: Secondary | ICD-10-CM | POA: Diagnosis present

## 2016-12-15 DIAGNOSIS — Z95810 Presence of automatic (implantable) cardiac defibrillator: Secondary | ICD-10-CM

## 2016-12-15 DIAGNOSIS — I252 Old myocardial infarction: Secondary | ICD-10-CM | POA: Diagnosis not present

## 2016-12-15 DIAGNOSIS — E1122 Type 2 diabetes mellitus with diabetic chronic kidney disease: Secondary | ICD-10-CM | POA: Diagnosis present

## 2016-12-15 DIAGNOSIS — R079 Chest pain, unspecified: Secondary | ICD-10-CM | POA: Diagnosis present

## 2016-12-15 DIAGNOSIS — Z79899 Other long term (current) drug therapy: Secondary | ICD-10-CM | POA: Diagnosis not present

## 2016-12-15 DIAGNOSIS — I130 Hypertensive heart and chronic kidney disease with heart failure and stage 1 through stage 4 chronic kidney disease, or unspecified chronic kidney disease: Secondary | ICD-10-CM | POA: Diagnosis present

## 2016-12-15 DIAGNOSIS — I4892 Unspecified atrial flutter: Secondary | ICD-10-CM | POA: Diagnosis not present

## 2016-12-15 DIAGNOSIS — Z7984 Long term (current) use of oral hypoglycemic drugs: Secondary | ICD-10-CM

## 2016-12-15 DIAGNOSIS — Z955 Presence of coronary angioplasty implant and graft: Secondary | ICD-10-CM | POA: Diagnosis not present

## 2016-12-15 DIAGNOSIS — Z7901 Long term (current) use of anticoagulants: Secondary | ICD-10-CM

## 2016-12-15 DIAGNOSIS — Z7982 Long term (current) use of aspirin: Secondary | ICD-10-CM

## 2016-12-15 DIAGNOSIS — I480 Paroxysmal atrial fibrillation: Secondary | ICD-10-CM | POA: Diagnosis not present

## 2016-12-15 DIAGNOSIS — Z953 Presence of xenogenic heart valve: Secondary | ICD-10-CM

## 2016-12-15 DIAGNOSIS — I200 Unstable angina: Secondary | ICD-10-CM | POA: Diagnosis not present

## 2016-12-15 DIAGNOSIS — I5042 Chronic combined systolic (congestive) and diastolic (congestive) heart failure: Secondary | ICD-10-CM | POA: Diagnosis present

## 2016-12-15 DIAGNOSIS — R7989 Other specified abnormal findings of blood chemistry: Secondary | ICD-10-CM

## 2016-12-15 DIAGNOSIS — E785 Hyperlipidemia, unspecified: Secondary | ICD-10-CM | POA: Diagnosis present

## 2016-12-15 DIAGNOSIS — I214 Non-ST elevation (NSTEMI) myocardial infarction: Secondary | ICD-10-CM | POA: Diagnosis not present

## 2016-12-15 DIAGNOSIS — I25700 Atherosclerosis of coronary artery bypass graft(s), unspecified, with unstable angina pectoris: Secondary | ICD-10-CM

## 2016-12-15 DIAGNOSIS — N183 Chronic kidney disease, stage 3 (moderate): Secondary | ICD-10-CM | POA: Diagnosis present

## 2016-12-15 DIAGNOSIS — M2550 Pain in unspecified joint: Secondary | ICD-10-CM | POA: Diagnosis not present

## 2016-12-15 DIAGNOSIS — I5022 Chronic systolic (congestive) heart failure: Secondary | ICD-10-CM | POA: Diagnosis present

## 2016-12-15 DIAGNOSIS — Z794 Long term (current) use of insulin: Secondary | ICD-10-CM | POA: Diagnosis not present

## 2016-12-15 DIAGNOSIS — E7800 Pure hypercholesterolemia, unspecified: Secondary | ICD-10-CM | POA: Diagnosis not present

## 2016-12-15 DIAGNOSIS — Z7401 Bed confinement status: Secondary | ICD-10-CM | POA: Diagnosis not present

## 2016-12-15 DIAGNOSIS — E119 Type 2 diabetes mellitus without complications: Secondary | ICD-10-CM | POA: Diagnosis not present

## 2016-12-15 DIAGNOSIS — Z950 Presence of cardiac pacemaker: Secondary | ICD-10-CM | POA: Diagnosis not present

## 2016-12-15 DIAGNOSIS — R778 Other specified abnormalities of plasma proteins: Secondary | ICD-10-CM

## 2016-12-15 LAB — PROTIME-INR
INR: 1.96
PROTHROMBIN TIME: 22.6 s — AB (ref 11.4–15.2)

## 2016-12-15 LAB — TROPONIN I
Troponin I: 0.51 ng/mL (ref <0.03)
Troponin I: 0.44 ng/mL (ref <0.03)
Troponin I: 0.36 ng/mL (ref <0.03)

## 2016-12-15 LAB — CBC
HCT: 36.3 % — ABNORMAL LOW (ref 39.0–52.0)
HEMOGLOBIN: 11.7 g/dL — AB (ref 13.0–17.0)
MCH: 28.5 pg (ref 26.0–34.0)
MCHC: 32.2 g/dL (ref 30.0–36.0)
MCV: 88.3 fL (ref 78.0–100.0)
PLATELETS: 165 10*3/uL (ref 150–400)
RBC: 4.11 MIL/uL — ABNORMAL LOW (ref 4.22–5.81)
RDW: 14.9 % (ref 11.5–15.5)
WBC: 7.3 10*3/uL (ref 4.0–10.5)

## 2016-12-15 LAB — HEPARIN LEVEL (UNFRACTIONATED)
HEPARIN UNFRACTIONATED: 0.1 [IU]/mL — AB (ref 0.30–0.70)
Heparin Unfractionated: 0.29 IU/mL — ABNORMAL LOW (ref 0.30–0.70)

## 2016-12-15 LAB — MRSA PCR SCREENING: MRSA by PCR: POSITIVE — AB

## 2016-12-15 LAB — BRAIN NATRIURETIC PEPTIDE

## 2016-12-15 MED ORDER — FUROSEMIDE 10 MG/ML IJ SOLN
40.0000 mg | Freq: Once | INTRAMUSCULAR | Status: AC
  Administered 2016-12-15: 40 mg via INTRAVENOUS
  Filled 2016-12-15: qty 4

## 2016-12-15 MED ORDER — ASPIRIN EC 81 MG PO TBEC
81.0000 mg | DELAYED_RELEASE_TABLET | Freq: Every evening | ORAL | Status: DC
  Administered 2016-12-15 – 2016-12-18 (×4): 81 mg via ORAL
  Filled 2016-12-15 (×4): qty 1

## 2016-12-15 MED ORDER — DIGOXIN 125 MCG PO TABS
0.0625 mg | ORAL_TABLET | Freq: Every day | ORAL | Status: DC
  Administered 2016-12-15 – 2016-12-19 (×5): 0.0625 mg via ORAL
  Filled 2016-12-15 (×5): qty 1

## 2016-12-15 MED ORDER — ROSUVASTATIN CALCIUM 10 MG PO TABS
10.0000 mg | ORAL_TABLET | Freq: Every day | ORAL | Status: DC
  Administered 2016-12-15 – 2016-12-18 (×4): 10 mg via ORAL
  Filled 2016-12-15 (×4): qty 1

## 2016-12-15 MED ORDER — SACUBITRIL-VALSARTAN 49-51 MG PO TABS
1.0000 | ORAL_TABLET | Freq: Two times a day (BID) | ORAL | Status: DC
  Administered 2016-12-15 – 2016-12-19 (×8): 1 via ORAL
  Filled 2016-12-15 (×9): qty 1

## 2016-12-15 MED ORDER — CHLORHEXIDINE GLUCONATE CLOTH 2 % EX PADS
6.0000 | MEDICATED_PAD | Freq: Every day | CUTANEOUS | Status: DC
  Administered 2016-12-15 – 2016-12-18 (×4): 6 via TOPICAL

## 2016-12-15 MED ORDER — AMIODARONE HCL 200 MG PO TABS
200.0000 mg | ORAL_TABLET | Freq: Every day | ORAL | Status: DC
  Administered 2016-12-15 – 2016-12-18 (×4): 200 mg via ORAL
  Filled 2016-12-15 (×4): qty 1

## 2016-12-15 MED ORDER — MUPIROCIN 2 % EX OINT
1.0000 "application " | TOPICAL_OINTMENT | Freq: Two times a day (BID) | CUTANEOUS | Status: DC
  Administered 2016-12-15 – 2016-12-19 (×9): 1 via NASAL
  Filled 2016-12-15 (×2): qty 22

## 2016-12-15 MED ORDER — FUROSEMIDE 20 MG PO TABS
20.0000 mg | ORAL_TABLET | ORAL | Status: DC
  Administered 2016-12-15 – 2016-12-19 (×3): 20 mg via ORAL
  Filled 2016-12-15 (×4): qty 1

## 2016-12-15 MED ORDER — HEPARIN (PORCINE) IN NACL 100-0.45 UNIT/ML-% IJ SOLN
1500.0000 [IU]/h | INTRAMUSCULAR | Status: DC
  Administered 2016-12-15: 1400 [IU]/h via INTRAVENOUS
  Administered 2016-12-16 – 2016-12-18 (×4): 1500 [IU]/h via INTRAVENOUS
  Filled 2016-12-15 (×5): qty 250

## 2016-12-15 MED ORDER — NITROGLYCERIN 0.4 MG SL SUBL: 0.4000 mg | SUBLINGUAL_TABLET | SUBLINGUAL | Status: DC | PRN

## 2016-12-15 MED ORDER — ACETAMINOPHEN 325 MG PO TABS: 650.0000 mg | ORAL_TABLET | ORAL | Status: DC | PRN

## 2016-12-15 MED ORDER — CARVEDILOL 6.25 MG PO TABS
6.2500 mg | ORAL_TABLET | Freq: Two times a day (BID) | ORAL | Status: DC
  Administered 2016-12-15 – 2016-12-19 (×9): 6.25 mg via ORAL
  Filled 2016-12-15 (×9): qty 1

## 2016-12-15 MED ORDER — MUPIROCIN 2 % EX OINT
TOPICAL_OINTMENT | CUTANEOUS | Status: AC
  Filled 2016-12-15: qty 22

## 2016-12-15 MED ORDER — ONDANSETRON HCL 4 MG/2ML IJ SOLN: 4.0000 mg | Freq: Four times a day (QID) | INTRAMUSCULAR | Status: DC | PRN

## 2016-12-15 NOTE — H&P (Signed)
History & Physical    Patient ID: Christian Bowman MRN: [id number], DOB/AGE: 1946/07/21   Admit date: 12/15/2016   Primary Physician: Purvis Kilts, MD Primary Cardiologist: Dr. Sallyanne Kuster Heart Failure: Dr. Aundra Dubin Electrophysiologist: Dr. Curt Bears   History of Present Illness    Christian Bowman is a 71 y.o. male with past medical history of with past medical history of CAD (s/p CABG in 2007 with LIMA to LAD, SVG to intermediate branch, sequential SVG to PDA and RPL branch, re-do CABG in 07/2016 with SVG-PDA and SVG-RI), bioprosthetic MVR at time of re-do CABG, CHB (s/p CRT-D in 07/2016), ischemic cardiomyopathy  Paroxysmal Atrial Flutter (s/p DCCV in 09/2016), HTN, HLD, and Type 2 DM who presents to [facility] on 12/15/2016 as a transfer from [facility].   Patient reports he was admitted on Monday at [facility] for an NSTEMI, as he developed chest pain on Sunday in the setting of elevated BP. Troponin values were ~ 0.20 according to the patient. He underwent an echocardiogram and stress test and is unsure of the exact results. A request has been sent to [employer] for the results to be faxed to [facility].   Starting last night, he developed a pressure along his sternum which he described as "intense acid reflux". He took 3 ASA without any relief and proceeded to the hospital. Denies any associated dyspnea, nausea, vomiting, or diaphoresis.   He was given 1 SL NTG while in the ED with complete resolution of his symptoms. Denies any repeat episodes of chest pain since.   Reports weights have been stable on his home scales at 173 lbs. Denies any orthopnea, PND, or lower extremity edema.   Review of labs from this AM show BNP 2148, WBC 8.73, Hgb 13.8, platelets 206, creatinine 1.65 (similar to previous results in 10/2016). Dig level 0.63. Troponin 0.266. INR 2.0.   Past Medical History    Past Medical History:  Diagnosis Date  . Anginal pain ([place])   . CAD (coronary artery disease)   .  Coronary artery disease involving coronary bypass graft   . Cyst of neck    right side  . DM2 (diabetes mellitus, type 2) ([place]) 08/26/2013  . Dyspnea   . Heart attack   . HTN (hypertension) 08/26/2013  . Hyperlipidemia 08/26/2013  . Left main coronary artery disease   . Left renal artery stenosis ([place])    Genesis 6x12 stent 2007  . Obesity (BMI 30.0-34.9) 08/26/2013  . Postoperative atrial fibrillation ([place]) 10/15/2005  . S/P CABG x 4 10/13/2005   LIMA to LAD, SVG to intermediate branch, sequential SVG to PDA and RPL branch, EVH via right thigh  . S/P mitral valve replacement with bioprosthetic valve 07/11/2016   31 mm [facility] Mitral bovine bioprosthetic tissue valve  . S/P redo CABG x 2 07/11/2016   SVG to PDA and SVG to Intermediate Branch, EVH via left thigh    Past Surgical History:  Procedure Laterality Date  . CARDIAC CATHETERIZATION N/A 06/21/2016   Procedure: Right/Left Heart Cath and Coronary/Graft Angiography;  Surgeon: Sherren Mocha, MD;  Location: [place] CV LAB;  Service: Cardiovascular;  Laterality: N/A;  . CARDIOVERSION N/A 07/19/2016   Procedure: CARDIOVERSION;  Surgeon: Lelon Perla, MD;  Location: [facility] ENDOSCOPY;  Service: Cardiovascular;  Laterality: N/A;  . CARDIOVERSION N/A 09/08/2016   Procedure: CARDIOVERSION;  Surgeon: Fay Records, MD;  Location: [place];  Service: Cardiovascular;  Laterality: N/A;  . CORONARY ARTERY BYPASS GRAFT  10/13/2005   LIMA to LAD, SVG to intermediate branch, sequential SVG to PDA and RPL  . CORONARY ARTERY BYPASS GRAFT N/A 07/11/2016   Procedure: REDO CORONARY ARTERY BYPASS GRAFTING (CABG) x two using left leg greater saphenous vein harvested endoscopically-SVG to PDA -SVG to RAMUS INTERMEDIATE;  Surgeon: Rexene Alberts, MD;  Location: [place];  Service: Open Heart Surgery;  Laterality: N/A;  . CORONARY ARTERY BYPASS GRAFT N/A 07/11/2016   Procedure: Re-exploration (CABG) for post op bleeding,;  Surgeon: Rexene Alberts, MD;  Location: [place];  Service: Open Heart Surgery;  Laterality: N/A;  . EP IMPLANTABLE DEVICE N/A 07/25/2016   Procedure: BiV ICD Insertion CRT-D;  Surgeon: Will Meredith Leeds, MD;  Location: [place] CV LAB;  Service: Cardiovascular;  Laterality: N/A;  . MITRAL VALVE REPLACEMENT N/A 07/11/2016   Procedure: MITRAL VALVE (MV) REPLACEMENT;  Surgeon: Rexene Alberts, MD;  Location: [place];  Service: Open Heart Surgery;  Laterality: N/A;  . MYOCARDICAL PERFUSION  10/09/2007   NORMAL PERFUSION IN ALL REGIONS;NO EVIDENCE OF INDUCIBLE ISCHEMIA;POST STRESS EF% 66  . PERCUTANEOUS CORONARY STENT INTERVENTION (PCI-S)     DES in SVG to right coronary artery system  . RENAL ARTERY STENT Right 2007  . RENAL DOPPLER  03/28/2010   RIGHT RA-NORMAL;LEFT PROXIMAL RA AT STENT-PATENT WITH NO EVIDENCE OF SIGN DIAMETER REDUCTION. R & L KIDNEYS: EQUAL IN SIZE,SYMMETRICAL IN SHAPE.  . TEE WITHOUT CARDIOVERSION N/A 06/15/2016   Procedure: TRANSESOPHAGEAL ECHOCARDIOGRAM (TEE);  Surgeon: Sanda Klein, MD;  Location: [facility] ENDOSCOPY;  Service: Cardiovascular;  Laterality: N/A;  . TEE WITHOUT CARDIOVERSION N/A 07/11/2016   Procedure: TRANSESOPHAGEAL ECHOCARDIOGRAM (TEE);  Surgeon: Rexene Alberts, MD;  Location: [place];  Service: Open Heart Surgery;  Laterality: N/A;  . TEE WITHOUT CARDIOVERSION N/A 07/19/2016   Procedure: TRANSESOPHAGEAL ECHOCARDIOGRAM (TEE);  Surgeon: Lelon Perla, MD;  Location: [facility] ENDOSCOPY;  Service: Cardiovascular;  Laterality: N/A;  . TRANSESOPHAGEAL ECHOCARDIOGRAM  10/19/2005   NORMAL LV; MILD TO MODERATE AMOUNT OF SOFT ATHEROMATOUS PLAQUE OF THE THORACIC AORTA; THE LEFT ATRIUM IS MILDLY DILATED;LEFT ATRIAL APPENDAGE FUNCTION IS NORMAL;NO THROMBUS IDENTIFIED. SMALL PFO WITH RIGHT TO LEFT SHUNT     Allergies  Allergies  Allergen Reactions  . Xanax [Alprazolam] Other (See Comments)    Pt feels very weak, tired and feels paralyzed       Home Medications    Prior to Admission  medications   Medication Sig Start Date End Date Taking? Authorizing Provider  amiodarone (PACERONE) 200 MG tablet Take 1 tablet (200 mg total) by mouth at bedtime. 09/18/16   [name] D Ninfa Meeker, NP  aspirin EC 81 MG tablet Take 81 mg by mouth every evening.    Historical Provider, MD  carvedilol (COREG) 6.25 MG tablet Take 1 tablet (6.25 mg total) by mouth 2 (two) times daily. 10/16/16 01/14/17  Larey Dresser, MD  digoxin (LANOXIN) 0.125 MG tablet Take 0.5 tablets (0.0625 mg total) by mouth daily. 11/07/16   Sanda Klein, MD  ENTRESTO 49-51 MG TAKE ONE TABLET BY MOUTH TWICE DAILY 12/11/16   Larey Dresser, MD  furosemide (LASIX) 20 MG tablet Take 1 tablet (20 mg total) by mouth every other day. 09/18/16   [name] D Ninfa Meeker, NP  glucose blood (ACCU-CHEK AVIVA) test strip Use as instructed bid E11.65 06/07/16   Cassandria Anger, MD  metFORMIN (GLUCOPHAGE) 500 MG tablet Take 1 tablet (500 mg total) by mouth 2 (two) times daily with a meal. 06/22/16  Cassandria Anger, MD  rosuvastatin (CRESTOR) 10 MG tablet TAKE ONE TABLET BY MOUTH ONCE DAILY 11/20/16   Sanda Klein, MD  warfarin (COUMADIN) 5 MG tablet Take 1 tablet (5 mg total) by mouth daily. 10/04/16   Sanda Klein, MD    Family History    Family History  Problem Relation Age of Onset  . Heart attack Mother   . Heart disease Mother   . Heart attack Father   . Heart disease Father   . Hypertension Father   . Hypertension Sister   . Heart disease Maternal Grandmother     Social History    Social History   Social History  . Marital status: Married    Spouse name: N/A  . Number of children: N/A  . Years of education: N/A   Occupational History  . Not on file.   Social History Main Topics  . Smoking status: Never Smoker  . Smokeless tobacco: Never Used  . Alcohol use No  . Drug use: No  . Sexual activity: Not Currently   Other Topics Concern  . Not on file   Social History Narrative  . No narrative on file     Review of  Systems    General:  No chills, fever, night sweats or weight changes.  Cardiovascular:  No dyspnea on exertion, edema, orthopnea, palpitations, paroxysmal nocturnal dyspnea. Positive for chest pain.  Dermatological: No rash, lesions/masses Respiratory: No cough, dyspnea Urologic: No hematuria, dysuria Abdominal:   No nausea, vomiting, diarrhea, bright red blood per rectum, melena, or hematemesis Neurologic:  No visual changes, wkns, changes in mental status. All other systems reviewed and are otherwise negative except as noted above.  Physical Exam    Blood pressure (!) 141/78, pulse 70, temperature 98.2 F (36.8 C), temperature source Oral, resp. rate 17, height 6' (1.829 m), weight 171 lb 15.3 oz (78 kg), SpO2 96 %.  General: Well developed, well nourished Caucasian male appearing in no acute distress. Head: Normocephalic, atraumatic, sclera non-icteric, no xanthomas, nares are without discharge.  Neck: No carotid bruits. JVD not elevated. Large mass along right neck (patient reports this is a cyst). Lungs: Respirations regular and unlabored, without wheezes or rales.  Heart: Regular rate and rhythm. No S3 or S4.  No murmur, no rubs, or gallops appreciated. Sternal scar noted.  Abdomen: Soft, non-tender, non-distended with normoactive bowel sounds. No hepatomegaly. No rebound/guarding. No obvious abdominal masses. Msk:  Strength and tone appear normal for age. No joint deformities or effusions. Extremities: No clubbing or cyanosis. No edema.  Distal pedal pulses are 2+ bilaterally. Neuro: Alert and oriented X 3. Moves all extremities spontaneously. No focal deficits noted. Psych:  Responds to questions appropriately with a normal affect. Skin: No rashes or lesions noted  Labs    Troponin (Point of Care Test) No results for input(s): TROPIPOC in the last 72 hours. No results for input(s): CKTOTAL, CKMB, TROPONINI in the last 72 hours. Lab Results  Component Value Date   WBC 8.1  09/04/2016   HGB 13.6 09/04/2016   HCT 42.4 09/04/2016   MCV 88.7 09/04/2016   PLT 167 09/04/2016   No results for input(s): NA, K, CL, CO2, BUN, CREATININE, CALCIUM, PROT, BILITOT, ALKPHOS, ALT, AST, GLUCOSE in the last 168 hours.  Invalid input(s): LABALBU Lab Results  Component Value Date   CHOL 95 (L) 04/20/2016   HDL 28 (L) 04/20/2016   LDLCALC 45 04/20/2016   TRIG 99 07/17/2016   No results found  for: DDIMER   B Natriuretic Peptide  Date/Time Value Ref Range Status  10/16/2016 12:39 PM 3,654.9 (H) 0.0 - 100.0 pg/mL Final  09/18/2016 12:32 PM 3,909.3 (H) 0.0 - 100.0 pg/mL Final  08/28/2016 03:21 PM 1,608.1 (H) 0.0 - 100.0 pg/mL Final   No results found for: PROBNP No results for input(s): INR in the last 72 hours.   Radiology Studies    No results found.  EKG & Cardiac Imaging    EKG:   Will obtain new tracing.   TEE: 07/19/2016 Study Conclusions - Left ventricle: The cavity size was severely dilated. Systolic   function was severely reduced. The estimated ejection fraction   was in the range of 20% to 25%. Diffuse hypokinesis. - Aortic valve: No evidence of vegetation. - Mitral valve: A bioprosthesis was present. - Left atrium: The atrium was moderately dilated. No evidence of   thrombus in the atrial cavity or appendage. - Right ventricle: Systolic function was moderately reduced. - Right atrium: No evidence of thrombus in the atrial cavity or   appendage. - Atrial septum: No defect or patent foramen ovale was identified. - Tricuspid valve: No evidence of vegetation. There was   mild-moderate regurgitation. - Pulmonic valve: No evidence of vegetation.  Impressions:  - Severe global reduction in LV functon; severe LVE; s/p MVR with   trace MR; moderate LAE; no LAA thrombus; moderate RV   diysfunction; mild to moderate TR.  Assessment & Plan    1. Chest Pain/ Elevated Troponin - reports 2 episodes of recurrent chest pain this week which he says is  similar to symptoms he experienced when he required prior PCI and re-do CABG.  - admitted at [facility] on 3/12 with an NSTEMI and troponin values approximately at ~ 0.20 by report of the patient but the overnight fellow says values were 2.0. Echo and NST were performed at that time and records have been requested.  - developed recurrent symptoms yesterday evening which were similar to prior episodes and relieved with SL NTG. BNP 2148, creatinine 1.65 (similar to previous results in 10/2016). Troponin 0.266.  - will cycle cardiac enzymes. Obtain repeat EKG. Will discuss further ischemic evaluation with Dr. Sallyanne Kuster, for if he requires a cath this would likely need to be on Monday with his current therapeutic INR. Start Heparin.   2. CAD - s/p CABG in 2007 with LIMA to LAD, SVG to intermediate branch, sequential SVG to PDA and RPL branch, re-do CABG in 07/2016 with SVG-PDA and SVG-RI. - continue ASA, statin, and BB.   3. Severe MR  - s/p bioprosthetic MVR at time of re-do CABG in 07/2016 - echo just performed on 12/11/2016 at [facility]. Records have been requested by CHF clinic.  4. CHB - s/p CRT-D in 07/2016.  - Followed by Dr. Curt Bears as an outpatient.   5. Ischemic Cardiomyopathy/ Chronic Combined Systolic and Diastolic CHF - EF 26-37% by TEE in 07/2016. Repeat echo performed on 3/12 with results having been requested.  - he does not appear volume overloaded by physical examination yet BNP this AM was 2148. Will recheck.   - continue BB, Entresto, and Lasix.   6. PAF - s/p DCCV in 09/2016. - This patients CHA2DS2-VASc Score and unadjusted Ischemic Stroke Rate (% per year) is equal to 7.2 % stroke rate/year from a score of 5 (CHF, HTN, DM, Vascular, Age). On Coumadin prior to admission. INR 2.0 this AM. Bridge with Heparin in anticipation of cardiac cath.  -  continue Amiodarone, Digoxin (Dig Level 0.63 this AM), and BB.   7. Type 2 DM - hold Metformin in case cardiac cath  is pursued.  - SSI while admitted.    Signed, Erma Heritage, PA-C 12/15/2016, 7:38 AM Pager: [phone]  I have seen and examined the patient along with Erma Heritage, PA-C.  I have reviewed the chart, notes and new data.  I agree with PA/NP's note.  Key new complaints: currently asymptomatic, no angina after he was given nitrates. Lying fully flat without dyspnea, on room air. Note spironolactone recently DC'd due to symptomatic low BP. Key examination changes: no JVD/S3/rales/edema Key new findings / data: reviewed records from initial hospitalization in [place] this Monday. Echo showed EF 30-35%, marginally high MVR gradients (peak 10, mean 5). Nuclear study showed dense inferior wall scar, mixed scar and ischemia in the anterolateral distribution - "reversible in the apical segments"). Reviewed last angio before redo CABG.  PLAN: He does not have signs or sxs of acute HF. He appears to have unstable angina, with 2 episodes at rest in the last week and with mild troponin elevation. He is fully anticoagulated for PAF. Potential sites for acute coronary events include the distal LAD artery (diffusely diseased beyond LIMA insertion, probably responsible for apical reversible defect on [place] nuke) or the non-bypassed LCX (80% ostial stenosis). Plan to hold warfarin, transition to IV heparin and perform coronary angio once INR<1.5. Hold entresto and furosemide and gently hydrate on morning of cath. Baseline creat around 1.6 (similar when checked Monday in [place]). Increase carvedilol slightly. Interrogate his CRT-D to see if paroxysmal arrhythmia might have caused his symptoms.   Sanda Klein, MD, [place] [phone] 12/15/2016, 9:42 AM

## 2016-12-15 NOTE — Progress Notes (Signed)
CRITICAL VALUE ALERT  Critical value received:  Troponin 0.51  Date of notification:  12/15/16  Time of notification:  6484  Critical value read back:Yes.    Nurse who received alert:  Gevena Cotton, RN  MD notified (1st page):  Cecilie Kicks, NP  Time of first page:  1030  No new orders at this time, patient chest pain free. Continue to monitor.

## 2016-12-15 NOTE — Progress Notes (Signed)
[place] for heparin Indication: chest pain/ACS and atrial fibrillation  Allergies  Allergen Reactions  . Xanax [Alprazolam] Other (See Comments)    Pt feels very weak, tired and feels paralyzed      Patient Measurements: Height: 6' (182.9 cm) Weight: 171 lb 15.3 oz (78 kg) IBW/kg (Calculated) : 77.6 Heparin Dosing Weight: 78  Vital Signs: Temp: 97.9 F (36.6 C) (03/16 2000) Temp Source: Oral (03/16 2000) BP: 120/66 (03/16 1800) Pulse Rate: 64 (03/16 1800)  Labs:  Recent Labs  12/15/16 0953 12/15/16 1623 12/15/16 2154  HGB 11.7*  --   --   HCT 36.3*  --   --   PLT 165  --   --   LABPROT 22.6*  --   --   INR 1.96  --   --   HEPARINUNFRC  --  0.10* 0.29*  TROPONINI 0.51* 0.44*  --     CrCl cannot be calculated (Patient's most recent lab result is older than the maximum 21 days allowed.).  Assessment: 71 y.o. male with past medical history of with past medical history of CAD (s/p CABG in 2007 with LIMA to LAD, SVG to intermediate branch, sequential SVG to PDA and RPL branch, re-do CABG in 07/2016 with SVG-PDA and SVG-RI), bioprosthetic MVR at time of re-do CABG, CHB (s/p CRT-D in 07/2016), ischemic cardiomyopathy  Paroxysmal Atrial Flutter (s/p DCCV in 09/2016), HTN, HLD, and Type 2 DM who presents to [facility] on 12/15/2016 as a transfer from [facility].   Patient on warfarin pta with INR that is just below goal at 1.9. Tentative plan for cath on Monday, will start IV heparin bridge. Patient's wife fills pill box and is mostly sure he took his warfarin last night.   Initial heparin level = 0.29  Goal of Therapy:  INR 2-3 Heparin level 0.3-0.7 units/ml Monitor platelets by anticoagulation protocol: Yes   Plan:  Increase heparin to 1500 units / hr Daily heparin level, CBC  Thank you Anette Guarneri, PharmD [phone] 12/15/2016 10:27 PM

## 2016-12-15 NOTE — Progress Notes (Signed)
[place] for heparin Indication: chest pain/ACS and atrial fibrillation  Allergies  Allergen Reactions  . Xanax [Alprazolam] Other (See Comments)    Pt feels very weak, tired and feels paralyzed      Patient Measurements: Height: 6' (182.9 cm) Weight: 171 lb 15.3 oz (78 kg) IBW/kg (Calculated) : 77.6 Heparin Dosing Weight: 78  Vital Signs: Temp: 98.2 F (36.8 C) (03/16 0656) Temp Source: Oral (03/16 0656) BP: 143/82 (03/16 0800) Pulse Rate: 69 (03/16 0800)  Labs:  Recent Labs  12/15/16 0953  HGB 11.7*  HCT 36.3*  PLT 165  LABPROT 22.6*  INR 1.96  TROPONINI 0.51*    CrCl cannot be calculated (Patient's most recent lab result is older than the maximum 21 days allowed.).  Assessment: 71 y.o. male with past medical history of with past medical history of CAD (s/p CABG in 2007 with LIMA to LAD, SVG to intermediate branch, sequential SVG to PDA and RPL branch, re-do CABG in 07/2016 with SVG-PDA and SVG-RI), bioprosthetic MVR at time of re-do CABG, CHB (s/p CRT-D in 07/2016), ischemic cardiomyopathy  Paroxysmal Atrial Flutter (s/p DCCV in 09/2016), HTN, HLD, and Type 2 DM who presents to [facility] on 12/15/2016 as a transfer from [facility].   Patient on warfarin pta with INR that is just below goal at 1.9. Tentative plan for cath on Monday, will start IV heparin bridge. Patient's wife fills pill box and is mostly sure he took his warfarin last night.   Goal of Therapy:  INR 2-3 Heparin level 0.3-0.7 units/ml Monitor platelets by anticoagulation protocol: Yes   Plan:  Start heparin at 1400 units/hr Daily CBC, heparin level  Erin Hearing PharmD., BCPS Clinical Pharmacist Pager [phone] 12/15/2016 1:11 PM

## 2016-12-15 NOTE — Progress Notes (Signed)
Pt arrived to 4n17. PA notified of arrival. VSS. Will continue to monitor.

## 2016-12-16 DIAGNOSIS — I214 Non-ST elevation (NSTEMI) myocardial infarction: Principal | ICD-10-CM

## 2016-12-16 LAB — BASIC METABOLIC PANEL
Anion gap: 9 (ref 5–15)
BUN: 27 mg/dL — ABNORMAL HIGH (ref 6–20)
CO2: 29 mmol/L (ref 22–32)
Calcium: 8.6 mg/dL — ABNORMAL LOW (ref 8.9–10.3)
Chloride: 102 mmol/L (ref 101–111)
Creatinine, Ser: 1.52 mg/dL — ABNORMAL HIGH (ref 0.61–1.24)
GFR, EST AFRICAN AMERICAN: 52 mL/min — AB (ref >60)
GFR, EST NON AFRICAN AMERICAN: 45 mL/min — AB (ref >60)
Glucose, Bld: 98 mg/dL (ref 65–99)
POTASSIUM: 3.5 mmol/L (ref 3.5–5.1)
SODIUM: 140 mmol/L (ref 135–145)

## 2016-12-16 LAB — CBC
HEMATOCRIT: 38.2 % — AB (ref 39.0–52.0)
HEMOGLOBIN: 12.2 g/dL — AB (ref 13.0–17.0)
MCH: 28.4 pg (ref 26.0–34.0)
MCHC: 31.9 g/dL (ref 30.0–36.0)
MCV: 89 fL (ref 78.0–100.0)
Platelets: 158 10*3/uL (ref 150–400)
RBC: 4.29 MIL/uL (ref 4.22–5.81)
RDW: 15.2 % (ref 11.5–15.5)
WBC: 6.5 10*3/uL (ref 4.0–10.5)

## 2016-12-16 LAB — HEPARIN LEVEL (UNFRACTIONATED): Heparin Unfractionated: 0.35 IU/mL (ref 0.30–0.70)

## 2016-12-16 LAB — PROTIME-INR
INR: 1.91
PROTHROMBIN TIME: 22.1 s — AB (ref 11.4–15.2)

## 2016-12-16 LAB — GLUCOSE, CAPILLARY
GLUCOSE-CAPILLARY: 131 mg/dL — AB (ref 65–99)
Glucose-Capillary: 188 mg/dL — ABNORMAL HIGH (ref 65–99)

## 2016-12-16 MED ORDER — INSULIN ASPART 100 UNIT/ML ~~LOC~~ SOLN
0.0000 [IU] | Freq: Three times a day (TID) | SUBCUTANEOUS | Status: DC
  Administered 2016-12-17 (×2): 2 [IU] via SUBCUTANEOUS

## 2016-12-16 MED ORDER — INSULIN ASPART 100 UNIT/ML ~~LOC~~ SOLN: 0.0000 [IU] | Freq: Every day | SUBCUTANEOUS | Status: DC

## 2016-12-16 MED ORDER — MAGNESIUM HYDROXIDE 400 MG/5ML PO SUSP
30.0000 mL | Freq: Every day | ORAL | Status: DC | PRN
  Administered 2016-12-16: 30 mL via ORAL
  Filled 2016-12-16: qty 30

## 2016-12-16 NOTE — Progress Notes (Signed)
[place] for heparin Indication: chest pain/ACS and atrial fibrillation  Allergies  Allergen Reactions  . Xanax [Alprazolam] Other (See Comments)    Pt feels very weak, tired and feels paralyzed      Patient Measurements: Height: 6' (182.9 cm) Weight: 171 lb 15.3 oz (78 kg) IBW/kg (Calculated) : 77.6 Heparin Dosing Weight: 78  Vital Signs: Temp: 98.3 F (36.8 C) (03/17 0805) Temp Source: Oral (03/17 0805) BP: 128/65 (03/17 0800) Pulse Rate: 63 (03/17 0800)  Labs:  Recent Labs  12/15/16 0953 12/15/16 1623 12/15/16 2154 12/16/16 0314  HGB 11.7*  --   --  12.2*  HCT 36.3*  --   --  38.2*  PLT 165  --   --  158  LABPROT 22.6*  --   --  22.1*  INR 1.96  --   --  1.91  HEPARINUNFRC  --  0.10* 0.29*  --   CREATININE  --   --   --  1.52*  TROPONINI 0.51* 0.44* 0.36*  --     Estimated Creatinine Clearance: 50.3 mL/min (A) (by C-G formula based on SCr of 1.52 mg/dL (H)).  Assessment: 71 y.o. male with past medical history of with past medical history of CAD (s/p CABG in 2007 with LIMA to LAD, SVG to intermediate branch, sequential SVG to PDA and RPL branch, re-do CABG in 07/2016 with SVG-PDA and SVG-RI), bioprosthetic MVR at time of re-do CABG, CHB (s/p CRT-D in 07/2016), ischemic cardiomyopathy  Paroxysmal Atrial Flutter (s/p DCCV in 09/2016), HTN, HLD, and Type 2 DM who presents to [facility] on 12/15/2016 as a transfer from [facility].   Patient on warfarin pta with INR that is just below goal at 1.9. Tentative plan for cath on Monday, on IV heparin bridge. Heparin level now 0.35. No issues reported  Goal of Therapy:  INR 2-3 Heparin level 0.3-0.7 units/ml Monitor platelets by anticoagulation protocol: Yes   Plan:  Continue heparin at 1500 units / hr Daily heparin level, CBC, INR  Melburn Popper, PharmD Clinical Pharmacy Resident Pager: [phone] 12/16/16 8:56 AM

## 2016-12-16 NOTE — Progress Notes (Signed)
CARDIAC REHAB PHASE I   PRE:  Rate/Rhythm: V Paced 67  BP:    Sitting: 144/96     SaO2: 96% Room Air  MODE:  Ambulation: 900 ft   POST:  Rate/Rhythem: V paced 75  BP:    Sitting: 168/98 then 144/83     SaO2: 100% Room Air  [phone] Patient ambulated 3 laps around 4 north without complaints of chest pain. Christian Bowman did report feeling slightly short of breath towards the end of his ambulation. Patient assisted back to recliner with call bell with in reach.  Harrell Gave RN BSN

## 2016-12-16 NOTE — Progress Notes (Signed)
Patient ID: Christian Bowman, male   DOB: 09-11-1946, 71 y.o.   MRN: [id number]   SUBJECTIVE: No complaints this morning, no chest pain or dyspnea.    Scheduled Meds: . amiodarone  200 mg Oral QHS  . aspirin EC  81 mg Oral QPM  . carvedilol  6.25 mg Oral BID  . Chlorhexidine Gluconate Cloth  6 each Topical Q0600  . digoxin  0.0625 mg Oral Daily  . furosemide  20 mg Oral QODAY  . mupirocin ointment  1 application Nasal BID  . rosuvastatin  10 mg Oral q1800  . sacubitril-valsartan  1 tablet Oral BID   Continuous Infusions: . heparin 1,500 Units/hr (12/15/16 2227)   PRN Meds:.acetaminophen, nitroGLYCERIN, ondansetron (ZOFRAN) IV    Vitals:   12/16/16 0400 12/16/16 0500 12/16/16 0700 12/16/16 0805  BP:  (!) 146/71 133/68   Pulse: 61 62 64   Resp: 12 19 14    Temp: 98.7 F (37.1 C)   98.3 F (36.8 C)  TempSrc: Oral   Oral  SpO2: 97% 96% 96%   Weight:      Height:        Intake/Output Summary (Last 24 hours) at 12/16/16 0807 Last data filed at 12/16/16 0500  Gross per 24 hour  Intake           440.22 ml  Output             1550 ml  Net         -1109.78 ml    LABS: Basic Metabolic Panel:  Recent Labs  12/16/16 0314  NA 140  K 3.5  CL 102  CO2 29  GLUCOSE 98  BUN 27*  CREATININE 1.52*  CALCIUM 8.6*   Liver Function Tests: No results for input(s): AST, ALT, ALKPHOS, BILITOT, PROT, ALBUMIN in the last 72 hours. No results for input(s): LIPASE, AMYLASE in the last 72 hours. CBC:  Recent Labs  12/15/16 0953 12/16/16 0314  WBC 7.3 6.5  HGB 11.7* 12.2*  HCT 36.3* 38.2*  MCV 88.3 89.0  PLT 165 158   Cardiac Enzymes:  Recent Labs  12/15/16 0953 12/15/16 1623 12/15/16 2154  TROPONINI 0.51* 0.44* 0.36*   BNP: Invalid input(s): POCBNP D-Dimer: No results for input(s): DDIMER in the last 72 hours. Hemoglobin A1C: No results for input(s): HGBA1C in the last 72 hours. Fasting Lipid Panel: No results for input(s): CHOL, HDL, LDLCALC, TRIG, CHOLHDL,  LDLDIRECT in the last 72 hours. Thyroid Function Tests: No results for input(s): TSH, T4TOTAL, T3FREE, THYROIDAB in the last 72 hours.  Invalid input(s): FREET3 Anemia Panel: No results for input(s): VITAMINB12, FOLATE, FERRITIN, TIBC, IRON, RETICCTPCT in the last 72 hours.  RADIOLOGY: No results found.  PHYSICAL EXAM General: NAD Neck: No JVD, lipoma right neck, no thyromegaly or thyroid nodule.  Lungs: Clear to auscultation bilaterally with normal respiratory effort. CV: Nondisplaced PMI.  Heart regular S1/S2, no S3/S4, no murmur.  No peripheral edema.   Abdomen: Soft, nontender, no hepatosplenomegaly, no distention.  Neurologic: Alert and oriented x 3.  Psych: Normal affect. Extremities: No clubbing or cyanosis.   TELEMETRY: Reviewed telemetry pt in NSR with BiV pacing  ASSESSMENT AND PLAN: 71 yo with history of CAD s/p CABG and redo CABG + bioprosthetic MVR, Medtronic CRT-D, paroxysmal atrial flutter s/p DCCV, type II DM presented with NSTEMI.  1. CAD: NSTEMI.  Peak troponin here 0.5.  No further chest pain. Nuclear study from earlier this [facility] showed dense inferior wall scar,  mixed scar and ischemia in the anterolateral distribution - "reversible in the apical segments").  Potential sites for acute coronary events include the distal LAD artery (diffusely diseased beyond LIMA insertion, probably responsible for apical reversible defect on [place] nuke) or the non-bypassed LCX (80% ostial stenosis). - INR 1.9 today, continue heparin gtt and hold coumadin.  - Continue ASA 81 and statin.  2. Chronic systolic CHF: Medtronic CRT-D.  Last echo 3/18 in [place] with EF 30-35%, bioprosthetic MV with mean gradient 5.  On exam, he does not appear volume overloaded.  - Continue Lasix every other day.  - Continue Coreg and Entresto.  - Spironolactone was stopped with hypotensive episodes in the recent past.  - Continue digoxin, level ok from [place].  3. Atrial flutter:  Paroxysmal.  In NSR here.  He is on amiodarone and anticoagulated.  Plan for eventual flutter ablation ([facility]).  4. CKD: Creatinine at baseline, around 1.5.  5. DM: Cover with sliding scale.   Loralie Champagne 12/16/2016 8:17 AM

## 2016-12-17 ENCOUNTER — Encounter (HOSPITAL_COMMUNITY): Payer: Self-pay | Admitting: *Deleted

## 2016-12-17 LAB — BASIC METABOLIC PANEL
Anion gap: 8 (ref 5–15)
BUN: 29 mg/dL — AB (ref 6–20)
CO2: 27 mmol/L (ref 22–32)
CREATININE: 1.4 mg/dL — AB (ref 0.61–1.24)
Calcium: 8.7 mg/dL — ABNORMAL LOW (ref 8.9–10.3)
Chloride: 103 mmol/L (ref 101–111)
GFR calc Af Amer: 58 mL/min — ABNORMAL LOW (ref >60)
GFR, EST NON AFRICAN AMERICAN: 50 mL/min — AB (ref >60)
Glucose, Bld: 106 mg/dL — ABNORMAL HIGH (ref 65–99)
POTASSIUM: 4 mmol/L (ref 3.5–5.1)
Sodium: 138 mmol/L (ref 135–145)

## 2016-12-17 LAB — CBC
HCT: 35.8 % — ABNORMAL LOW (ref 39.0–52.0)
Hemoglobin: 11.7 g/dL — ABNORMAL LOW (ref 13.0–17.0)
MCH: 29.3 pg (ref 26.0–34.0)
MCHC: 32.7 g/dL (ref 30.0–36.0)
MCV: 89.5 fL (ref 78.0–100.0)
PLATELETS: 165 10*3/uL (ref 150–400)
RBC: 4 MIL/uL — AB (ref 4.22–5.81)
RDW: 15.3 % (ref 11.5–15.5)
WBC: 5.9 10*3/uL (ref 4.0–10.5)

## 2016-12-17 LAB — PROTIME-INR
INR: 1.76
PROTHROMBIN TIME: 20.8 s — AB (ref 11.4–15.2)

## 2016-12-17 LAB — GLUCOSE, CAPILLARY
GLUCOSE-CAPILLARY: 112 mg/dL — AB (ref 65–99)
GLUCOSE-CAPILLARY: 125 mg/dL — AB (ref 65–99)
Glucose-Capillary: 145 mg/dL — ABNORMAL HIGH (ref 65–99)
Glucose-Capillary: 123 mg/dL — ABNORMAL HIGH (ref 65–99)

## 2016-12-17 LAB — HEMOGLOBIN A1C
Hgb A1c MFr Bld: 6.2 % — ABNORMAL HIGH (ref 4.8–5.6)
Mean Plasma Glucose: 131 mg/dL

## 2016-12-17 LAB — HEPARIN LEVEL (UNFRACTIONATED): HEPARIN UNFRACTIONATED: 0.48 [IU]/mL (ref 0.30–0.70)

## 2016-12-17 MED ORDER — ASPIRIN 81 MG PO CHEW: 81.0000 mg | CHEWABLE_TABLET | ORAL | Status: DC

## 2016-12-17 MED ORDER — SODIUM CHLORIDE 0.9 % WEIGHT BASED INFUSION: 3.0000 mL/kg/h | INTRAVENOUS | Status: DC

## 2016-12-17 MED ORDER — SODIUM CHLORIDE 0.9% FLUSH: 3.0000 mL | INTRAVENOUS | Status: DC | PRN

## 2016-12-17 MED ORDER — SODIUM CHLORIDE 0.9 % WEIGHT BASED INFUSION
1.0000 mL/kg/h | INTRAVENOUS | Status: DC
  Administered 2016-12-18: 1 mL/kg/h via INTRAVENOUS

## 2016-12-17 MED ORDER — SODIUM CHLORIDE 0.9 % IV SOLN: 250.0000 mL | INTRAVENOUS | Status: DC | PRN

## 2016-12-17 MED ORDER — ASPIRIN 81 MG PO CHEW
81.0000 mg | CHEWABLE_TABLET | ORAL | Status: AC
  Administered 2016-12-18: 81 mg via ORAL
  Filled 2016-12-17: qty 1

## 2016-12-17 MED ORDER — SODIUM CHLORIDE 0.9 % WEIGHT BASED INFUSION: 1.0000 mL/kg/h | INTRAVENOUS | Status: DC

## 2016-12-17 MED ORDER — SODIUM CHLORIDE 0.9% FLUSH: 3.0000 mL | Freq: Two times a day (BID) | INTRAVENOUS | Status: DC

## 2016-12-17 MED ORDER — SODIUM CHLORIDE 0.9 % WEIGHT BASED INFUSION
3.0000 mL/kg/h | INTRAVENOUS | Status: AC
  Administered 2016-12-18: 3 mL/kg/h via INTRAVENOUS

## 2016-12-17 MED ORDER — ALUM & MAG HYDROXIDE-SIMETH 200-200-20 MG/5ML PO SUSP
30.0000 mL | ORAL | Status: DC | PRN
  Filled 2016-12-17: qty 30

## 2016-12-17 NOTE — Progress Notes (Signed)
Patient ID: Christian Bowman, male   DOB: [birth year], 71 y.o.   MRN: [id number]   SUBJECTIVE: No complaints this morning, no chest pain or dyspnea.  Walked in hall yesterday without problems. INR 1.76 today, on heparin gtt.   Scheduled Meds: . amiodarone  200 mg Oral QHS  . aspirin EC  81 mg Oral QPM  . carvedilol  6.25 mg Oral BID  . Chlorhexidine Gluconate Cloth  6 each Topical Q0600  . digoxin  0.0625 mg Oral Daily  . furosemide  20 mg Oral QODAY  . insulin aspart  0-15 Units Subcutaneous TID WC  . insulin aspart  0-5 Units Subcutaneous QHS  . mupirocin ointment  1 application Nasal BID  . rosuvastatin  10 mg Oral q1800  . sacubitril-valsartan  1 tablet Oral BID   Continuous Infusions: . heparin 1,500 Units/hr (12/17/16 0700)   PRN Meds:.acetaminophen, magnesium hydroxide, nitroGLYCERIN, ondansetron (ZOFRAN) IV    Vitals:   12/17/16 0200 12/17/16 0300 12/17/16 0700 12/17/16 0737  BP: 122/63     Pulse: (!) 59 (!) 59    Resp: 20 18    Temp:    97.6 F (36.4 C)  TempSrc:    Oral  SpO2: 95% 96%    Weight:   169 lb 14.4 oz (77.1 kg)   Height:        Intake/Output Summary (Last 24 hours) at 12/17/16 0823 Last data filed at 12/17/16 0700  Gross per 24 hour  Intake             1425 ml  Output              800 ml  Net              625 ml    LABS: Basic Metabolic Panel:  Recent Labs  12/16/16 0314 12/17/16 0244  NA 140 138  K 3.5 4.0  CL 102 103  CO2 29 27  GLUCOSE 98 106*  BUN 27* 29*  CREATININE 1.52* 1.40*  CALCIUM 8.6* 8.7*   Liver Function Tests: No results for input(s): AST, ALT, ALKPHOS, BILITOT, PROT, ALBUMIN in the last 72 hours. No results for input(s): LIPASE, AMYLASE in the last 72 hours. CBC:  Recent Labs  12/16/16 0314 12/17/16 0244  WBC 6.5 5.9  HGB 12.2* 11.7*  HCT 38.2* 35.8*  MCV 89.0 89.5  PLT 158 165   Cardiac Enzymes:  Recent Labs  12/15/16 0953 12/15/16 1623 12/15/16 2154  TROPONINI 0.51* 0.44* 0.36*   BNP: Invalid  input(s): POCBNP D-Dimer: No results for input(s): DDIMER in the last 72 hours. Hemoglobin A1C: No results for input(s): HGBA1C in the last 72 hours. Fasting Lipid Panel: No results for input(s): CHOL, HDL, LDLCALC, TRIG, CHOLHDL, LDLDIRECT in the last 72 hours. Thyroid Function Tests: No results for input(s): TSH, T4TOTAL, T3FREE, THYROIDAB in the last 72 hours.  Invalid input(s): FREET3 Anemia Panel: No results for input(s): VITAMINB12, FOLATE, FERRITIN, TIBC, IRON, RETICCTPCT in the last 72 hours.  RADIOLOGY: No results found.  PHYSICAL EXAM General: Eating breakfast.  Neck: No JVD, lipoma right neck, no thyromegaly or thyroid nodule.  Lungs: Clear to auscultation bilaterally with normal respiratory effort. CV: Nondisplaced PMI.  Heart regular S1/S2, no S3/S4.  No murmur.  No peripheral edema.   Abdomen: Soft, nontender, no hepatosplenomegaly, no distention.  Neurologic: Alert and oriented x 3.  Psych: Normal affect. Extremities: No clubbing or cyanosis.   TELEMETRY: Reviewed telemetry pt in NSR with BiV pacing  ASSESSMENT AND PLAN: 71 yo with history of CAD s/p CABG and redo CABG + bioprosthetic MVR, Medtronic CRT-D, paroxysmal atrial flutter s/p DCCV, type II DM presented with NSTEMI.  1. CAD: NSTEMI.  Peak troponin here 0.5.  No further chest pain. Nuclear study from earlier this week at [facility] showed dense inferior wall scar, mixed scar and ischemia in the anterolateral distribution - "reversible in the apical segments").  Potential sites for acute coronary events include the distal LAD artery (diffusely diseased beyond LIMA insertion, probably responsible for apical reversible defect on [place] nuke) or the non-bypassed LCX (80% ostial stenosis). - INR 1.76 today, continue heparin gtt and hold coumadin.  - Continue ASA 81 and statin.  - Plan for cardiac cath tomorrow.  Risks/benefits of procedure discussed with patient, he agrees to procedure.  2. Chronic systolic CHF:  Medtronic CRT-D.  Last echo 3/18 in [place] with EF 30-35%, bioprosthetic MV with mean gradient 5.  On exam, he does not appear volume overloaded.  Walked yesterday without dyspnea.  - Continue Lasix every other day.  - Continue Coreg and Entresto.  - Spironolactone was stopped with hypotensive episodes in the recent past.  - Continue digoxin, level ok from [place].  3. Atrial flutter: Paroxysmal.  In NSR here.  He is on amiodarone and anticoagulated.  Plan for eventual flutter ablation ([facility]).  4. CKD: Creatinine at baseline, around 1.4. Will be careful with contrast on cath.  5. DM: Cover with sliding scale.   Loralie Champagne 12/17/2016 8:23 AM

## 2016-12-17 NOTE — Progress Notes (Signed)
[place] for heparin Indication: chest pain/ACS and atrial fibrillation  Allergies  Allergen Reactions  . Xanax [Alprazolam] Other (See Comments)    Pt feels very weak, tired and feels paralyzed      Patient Measurements: Height: 6' (182.9 cm) Weight: 169 lb 14.4 oz (77.1 kg) IBW/kg (Calculated) : 77.6 Heparin Dosing Weight: 78  Vital Signs: Temp: 97.6 F (36.4 C) (03/18 0737) Temp Source: Oral (03/18 0737) BP: 117/65 (03/18 1000) Pulse Rate: 61 (03/18 1010)  Labs:  Recent Labs  12/15/16 0953  12/15/16 1623 12/15/16 2154 12/16/16 0314 12/16/16 1100 12/17/16 0244  HGB 11.7*  --   --   --  12.2*  --  11.7*  HCT 36.3*  --   --   --  38.2*  --  35.8*  PLT 165  --   --   --  158  --  165  LABPROT 22.6*  --   --   --  22.1*  --  20.8*  INR 1.96  --   --   --  1.91  --  1.76  HEPARINUNFRC  --   < > 0.10* 0.29*  --  0.35 0.48  CREATININE  --   --   --   --  1.52*  --  1.40*  TROPONINI 0.51*  --  0.44* 0.36*  --   --   --   < > = values in this interval not displayed.  Estimated Creatinine Clearance: 54.3 mL/min (A) (by C-G formula based on SCr of 1.4 mg/dL (H)).  Assessment: 71 y.o. male with past medical history of with past medical history of CAD (s/p CABG in 2007 with LIMA to LAD, SVG to intermediate branch, sequential SVG to PDA and RPL branch, re-do CABG in 07/2016 with SVG-PDA and SVG-RI), bioprosthetic MVR at time of re-do CABG, CHB (s/p CRT-D in 07/2016), ischemic cardiomyopathy  Paroxysmal Atrial Flutter (s/p DCCV in 09/2016), HTN, HLD, and Type 2 DM who presents to [facility] on 12/15/2016 as a transfer from [facility].   Patient on warfarin pta with INR trending down. Tentative plan for cath on Monday, on IV heparin. Heparin level remains therapeutic 0.48. No issues reported  Goal of Therapy:  INR 2-3 Heparin level 0.3-0.7 units/ml Monitor platelets by anticoagulation protocol: Yes   Plan:  Continue heparin at  1500 units / hr Daily heparin level, CBC, INR  Melburn Popper, PharmD Clinical Pharmacy Resident Pager: [phone] 12/17/16 10:59 AM

## 2016-12-17 NOTE — Progress Notes (Signed)
12/17/2016 1345 Received pt to room 2W28 from 4N.  Pt is A&O and without C/O.  Tele monitor applied and CCMD notified.  Oriented to room, call light and bed.  Call bell and wife at bedside. [place]

## 2016-12-18 ENCOUNTER — Encounter (HOSPITAL_COMMUNITY)
Admission: AD | Disposition: A | Discharge status: Home / Self Care | Payer: Self-pay | Source: Other Acute Inpatient Hospital | Attending: Cardiovascular Disease

## 2016-12-18 ENCOUNTER — Encounter (HOSPITAL_COMMUNITY): Payer: Self-pay

## 2016-12-18 DIAGNOSIS — I4892 Unspecified atrial flutter: Secondary | ICD-10-CM

## 2016-12-18 DIAGNOSIS — I2510 Atherosclerotic heart disease of native coronary artery without angina pectoris: Secondary | ICD-10-CM

## 2016-12-18 HISTORY — PX: LEFT HEART CATH AND CORS/GRAFTS ANGIOGRAPHY: CATH118250

## 2016-12-18 LAB — GLUCOSE, CAPILLARY
GLUCOSE-CAPILLARY: 100 mg/dL — AB (ref 65–99)
GLUCOSE-CAPILLARY: 140 mg/dL — AB (ref 65–99)
Glucose-Capillary: 96 mg/dL (ref 65–99)

## 2016-12-18 LAB — CBC
HEMATOCRIT: 34.2 % — AB (ref 39.0–52.0)
HEMOGLOBIN: 11.2 g/dL — AB (ref 13.0–17.0)
MCH: 29.2 pg (ref 26.0–34.0)
MCHC: 32.7 g/dL (ref 30.0–36.0)
MCV: 89.3 fL (ref 78.0–100.0)
Platelets: 168 10*3/uL (ref 150–400)
RBC: 3.83 MIL/uL — AB (ref 4.22–5.81)
RDW: 15.5 % (ref 11.5–15.5)
WBC: 5.9 10*3/uL (ref 4.0–10.5)

## 2016-12-18 LAB — BASIC METABOLIC PANEL
ANION GAP: 8 (ref 5–15)
BUN: 29 mg/dL — AB (ref 6–20)
CHLORIDE: 103 mmol/L (ref 101–111)
CO2: 28 mmol/L (ref 22–32)
Calcium: 8.7 mg/dL — ABNORMAL LOW (ref 8.9–10.3)
Creatinine, Ser: 1.69 mg/dL — ABNORMAL HIGH (ref 0.61–1.24)
GFR calc Af Amer: 46 mL/min — ABNORMAL LOW (ref >60)
GFR calc non Af Amer: 40 mL/min — ABNORMAL LOW (ref >60)
GLUCOSE: 116 mg/dL — AB (ref 65–99)
POTASSIUM: 3.9 mmol/L (ref 3.5–5.1)
Sodium: 139 mmol/L (ref 135–145)

## 2016-12-18 LAB — PROTIME-INR
INR: 1.36
PROTHROMBIN TIME: 16.8 s — AB (ref 11.4–15.2)

## 2016-12-18 LAB — HEPARIN LEVEL (UNFRACTIONATED): HEPARIN UNFRACTIONATED: 0.49 [IU]/mL (ref 0.30–0.70)

## 2016-12-18 SURGERY — LEFT HEART CATH AND CORS/GRAFTS ANGIOGRAPHY
Anesthesia: LOCAL

## 2016-12-18 MED ORDER — HEPARIN (PORCINE) IN NACL 2-0.9 UNIT/ML-% IJ SOLN
INTRAMUSCULAR | Status: DC | PRN
  Administered 2016-12-18: 1000 mL

## 2016-12-18 MED ORDER — IOPAMIDOL (ISOVUE-370) INJECTION 76%
INTRAVENOUS | Status: AC
  Filled 2016-12-18: qty 125

## 2016-12-18 MED ORDER — SODIUM CHLORIDE 0.9 % IV SOLN: 250.0000 mL | INTRAVENOUS | Status: DC | PRN

## 2016-12-18 MED ORDER — HEPARIN (PORCINE) IN NACL 2-0.9 UNIT/ML-% IJ SOLN
INTRAMUSCULAR | Status: AC
Start: 2016-12-18 — End: 2016-12-18
  Filled 2016-12-18: qty 1000

## 2016-12-18 MED ORDER — MIDAZOLAM HCL 2 MG/2ML IJ SOLN
INTRAMUSCULAR | Status: AC
  Filled 2016-12-18: qty 2

## 2016-12-18 MED ORDER — VERAPAMIL HCL 2.5 MG/ML IV SOLN
INTRAVENOUS | Status: DC | PRN
  Administered 2016-12-18: 10 mL via INTRA_ARTERIAL

## 2016-12-18 MED ORDER — WARFARIN - PHARMACIST DOSING INPATIENT: Freq: Every day | Status: DC

## 2016-12-18 MED ORDER — SODIUM CHLORIDE 0.9 % WEIGHT BASED INFUSION
1.0000 mL/kg/h | INTRAVENOUS | Status: AC
  Administered 2016-12-18: 1 mL/kg/h via INTRAVENOUS

## 2016-12-18 MED ORDER — VERAPAMIL HCL 2.5 MG/ML IV SOLN
INTRAVENOUS | Status: AC
  Filled 2016-12-18: qty 2

## 2016-12-18 MED ORDER — HEPARIN SODIUM (PORCINE) 1000 UNIT/ML IJ SOLN
INTRAMUSCULAR | Status: DC | PRN
  Administered 2016-12-18: 5000 [IU] via INTRAVENOUS

## 2016-12-18 MED ORDER — LIDOCAINE HCL (PF) 1 % IJ SOLN
INTRAMUSCULAR | Status: AC
  Filled 2016-12-18: qty 30

## 2016-12-18 MED ORDER — SODIUM CHLORIDE 0.9% FLUSH
3.0000 mL | Freq: Two times a day (BID) | INTRAVENOUS | Status: DC
  Administered 2016-12-19: 3 mL via INTRAVENOUS

## 2016-12-18 MED ORDER — MIDAZOLAM HCL 2 MG/2ML IJ SOLN
INTRAMUSCULAR | Status: DC | PRN
  Administered 2016-12-18: 2 mg via INTRAVENOUS

## 2016-12-18 MED ORDER — FENTANYL CITRATE (PF) 100 MCG/2ML IJ SOLN
INTRAMUSCULAR | Status: AC
  Filled 2016-12-18: qty 2

## 2016-12-18 MED ORDER — SODIUM CHLORIDE 0.9% FLUSH: 3.0000 mL | INTRAVENOUS | Status: DC | PRN

## 2016-12-18 MED ORDER — FENTANYL CITRATE (PF) 100 MCG/2ML IJ SOLN
INTRAMUSCULAR | Status: DC | PRN
  Administered 2016-12-18: 25 ug via INTRAVENOUS

## 2016-12-18 MED ORDER — HEPARIN SODIUM (PORCINE) 1000 UNIT/ML IJ SOLN
INTRAMUSCULAR | Status: AC
  Filled 2016-12-18: qty 1

## 2016-12-18 MED ORDER — IOPAMIDOL (ISOVUE-370) INJECTION 76%
INTRAVENOUS | Status: AC
  Filled 2016-12-18: qty 100

## 2016-12-18 MED ORDER — WARFARIN SODIUM 10 MG PO TABS
10.0000 mg | ORAL_TABLET | Freq: Once | ORAL | Status: AC
  Administered 2016-12-18: 10 mg via ORAL
  Filled 2016-12-18: qty 1

## 2016-12-18 MED ORDER — LIDOCAINE HCL (PF) 1 % IJ SOLN
INTRAMUSCULAR | Status: DC | PRN
  Administered 2016-12-18: 2 mL

## 2016-12-18 SURGICAL SUPPLY — 11 items
CATH INFINITI 5 FR MPA2 (CATHETERS) ×1 IMPLANT
CATH INFINITI 5FR AL1 (CATHETERS) ×1 IMPLANT
CATH INFINITI 5FR MULTPACK ANG (CATHETERS) ×1 IMPLANT
DEVICE RAD COMP TR BAND LRG (VASCULAR PRODUCTS) ×2 IMPLANT
GLIDESHEATH SLEND SS 6F .021 (SHEATH) ×2 IMPLANT
GUIDEWIRE INQWIRE 1.5J.035X260 (WIRE) IMPLANT
INQWIRE 1.5J .035X260CM (WIRE) ×2
KIT HEART LEFT (KITS) ×2 IMPLANT
PACK CARDIAC CATHETERIZATION (CUSTOM PROCEDURE TRAY) ×2 IMPLANT
TRANSDUCER W/STOPCOCK (MISCELLANEOUS) ×2 IMPLANT
TUBING CIL FLEX 10 FLL-RA (TUBING) ×2 IMPLANT

## 2016-12-18 NOTE — H&P (View-Only) (Signed)
Patient ID: Christian Bowman, male   DOB: 1946-02-17, 71 y.o.   MRN: [id number]   SUBJECTIVE:  No further chest pain, no dyspnea.  Walked in hall yesterday without problems. INR 1.36 today, on heparin gtt.   Scheduled Meds: . amiodarone  200 mg Oral QHS  . aspirin EC  81 mg Oral QPM  . carvedilol  6.25 mg Oral BID  . Chlorhexidine Gluconate Cloth  6 each Topical Q0600  . digoxin  0.0625 mg Oral Daily  . furosemide  20 mg Oral QODAY  . insulin aspart  0-15 Units Subcutaneous TID WC  . insulin aspart  0-5 Units Subcutaneous QHS  . mupirocin ointment  1 application Nasal BID  . rosuvastatin  10 mg Oral q1800  . sacubitril-valsartan  1 tablet Oral BID  . sodium chloride flush  3 mL Intravenous Q12H  . sodium chloride flush  3 mL Intravenous Q12H   Continuous Infusions: . sodium chloride    . heparin 1,500 Units/hr (12/17/16 1618)   PRN Meds:.sodium chloride, sodium chloride, acetaminophen, alum & mag hydroxide-simeth, magnesium hydroxide, nitroGLYCERIN, ondansetron (ZOFRAN) IV, sodium chloride flush, sodium chloride flush    Vitals:   12/17/16 1240 12/17/16 1357 12/17/16 2004 12/18/16 0516  BP: (!) 158/78 133/73 138/66 119/64  Pulse: 76 67 65 68  Resp: 17 20 18 18   Temp:  98.7 F (37.1 C) 97.6 F (36.4 C) 97.8 F (36.6 C)  TempSrc:  Oral Oral Oral  SpO2: 98% 97% 98% 96%  Weight:  171 lb 6.4 oz (77.7 kg)  171 lb 3.2 oz (77.7 kg)  Height:  6' (1.829 m)      Intake/Output Summary (Last 24 hours) at 12/18/16 0941 Last data filed at 12/17/16 1300  Gross per 24 hour  Intake              540 ml  Output              215 ml  Net              325 ml    LABS: Basic Metabolic Panel:  Recent Labs  12/17/16 0244 12/18/16 0406  NA 138 139  K 4.0 3.9  CL 103 103  CO2 27 28  GLUCOSE 106* 116*  BUN 29* 29*  CREATININE 1.40* 1.69*  CALCIUM 8.7* 8.7*   Liver Function Tests: No results for input(s): AST, ALT, ALKPHOS, BILITOT, PROT, ALBUMIN in the last 72 hours. No results for  input(s): LIPASE, AMYLASE in the last 72 hours. CBC:  Recent Labs  12/17/16 0244 12/18/16 0406  WBC 5.9 5.9  HGB 11.7* 11.2*  HCT 35.8* 34.2*  MCV 89.5 89.3  PLT 165 168   Cardiac Enzymes:  Recent Labs  12/15/16 0953 12/15/16 1623 12/15/16 2154  TROPONINI 0.51* 0.44* 0.36*   BNP: Invalid input(s): POCBNP D-Dimer: No results for input(s): DDIMER in the last 72 hours. Hemoglobin A1C:  Recent Labs  12/16/16 1824  HGBA1C 6.2*   Fasting Lipid Panel: No results for input(s): CHOL, HDL, LDLCALC, TRIG, CHOLHDL, LDLDIRECT in the last 72 hours. Thyroid Function Tests: No results for input(s): TSH, T4TOTAL, T3FREE, THYROIDAB in the last 72 hours.  Invalid input(s): FREET3 Anemia Panel: No results for input(s): VITAMINB12, FOLATE, FERRITIN, TIBC, IRON, RETICCTPCT in the last 72 hours.  RADIOLOGY: No results found.  PHYSICAL EXAM General: NAD Neck: No JVD, lipoma right neck, no thyromegaly or thyroid nodule.  Lungs: CTAB CV: Nondisplaced PMI.  Heart regular S1/S2, no S3/S4.  No murmur.  No peripheral edema.   Abdomen: Soft, nontender, no hepatosplenomegaly, no distention.  Neurologic: Alert and oriented x 3.  Psych: Normal affect. Extremities: No clubbing or cyanosis.   TELEMETRY: Personally reviewed telemetry pt in NSR with BiV pacing  ASSESSMENT AND PLAN: 71 yo with history of CAD s/p CABG and redo CABG + bioprosthetic MVR, Medtronic CRT-D, paroxysmal atrial flutter s/p DCCV, type II DM presented with NSTEMI.  1. CAD: NSTEMI.  Peak troponin here 0.5.  No further chest pain. Nuclear study from last week at [facility] showed dense inferior wall scar, mixed scar and ischemia in the anterolateral distribution - "reversible in the apical segments".  Potential sites for acute coronary event includes the distal LAD artery (diffusely diseased beyond LIMA insertion, probably responsible for apical reversible defect on [place] nuke) or the non-bypassed LCX (80% ostial  stenosis). - INR 1.36 today, ok for cath.  - Continue ASA 81 and statin.  - Coronary angiography today.  Risks/benefits of procedure discussed with patient, he agrees to procedure.  2. Chronic systolic CHF: Medtronic CRT-D.  Last echo 3/18 in [place] with EF 30-35%, bioprosthetic MV with mean gradient 5.  On exam, he does not appear volume overloaded.  Walked yesterday without dyspnea.  - Continue Lasix every other day.  - Continue Coreg and Entresto.  - Spironolactone was stopped with hypotensive episodes in the recent past.  - Continue digoxin, level ok from [place].  3. Atrial flutter: Paroxysmal.  In NSR here.  He is on amiodarone and anticoagulated.  Plan for eventual flutter ablation ([facility]).  - After procedure, will need to go back on warfarin.  I do not think that he will have to go home on Lovenox bridge.  - If stent is placed, would favor clopidogrel use with warfarin.  Would continue ASA 81 only a short time after PCI.  4. CKD: Creatinine 1.69, slightly higher than yesterday but around his baseline.  Will give gentle hydration today, no Lasix. Will be careful with contrast on cath.  5. DM: Cover with sliding scale.   Loralie Champagne 12/18/2016 9:41 AM

## 2016-12-18 NOTE — Progress Notes (Signed)
ANTICOAGULATION CONSULT NOTE - Follow Up Consult  Pharmacy Consult for Heparin to Warfarin Indication: chest pain/ACS  / Afib  Allergies  Allergen Reactions  . Xanax [Alprazolam] Other (See Comments)    Pt feels very weak, tired and feels paralyzed      Patient Measurements: Height: 6' (182.9 cm) Weight: 171 lb 3.2 oz (77.7 kg) IBW/kg (Calculated) : 77.6 Heparin Dosing Weight:  77.7 kg  Vital Signs: Temp: 97.8 F (36.6 C) (03/19 0516) Temp Source: Oral (03/19 0516) BP: 143/75 (03/19 1513) Pulse Rate: 61 (03/19 1513)  Labs:  Recent Labs  12/15/16 1623 12/15/16 2154  12/16/16 0314 12/16/16 1100 12/17/16 0244 12/18/16 0406  HGB  --   --   < > 12.2*  --  11.7* 11.2*  HCT  --   --   --  38.2*  --  35.8* 34.2*  PLT  --   --   --  158  --  165 168  LABPROT  --   --   --  22.1*  --  20.8* 16.8*  INR  --   --   --  1.91  --  1.76 1.36  HEPARINUNFRC 0.10* 0.29*  --   --  0.35 0.48 0.49  CREATININE  --   --   --  1.52*  --  1.40* 1.69*  TROPONINI 0.44* 0.36*  --   --   --   --   --   < > = values in this interval not displayed.  Estimated Creatinine Clearance: 45.3 mL/min (A) (by C-G formula based on SCr of 1.69 mg/dL (H)).   Assessment: 71 year old male s/p cath now resuming warfarin (dose PTA - 5 mg Sun, Tues, Wed, Fri, Sat, 7.5 mg M R)  Goal of Therapy:  INR = 2 to 3 Monitor platelets by anticoagulation protocol: Yes   Plan:  Heparin stopped post cath, planning medical therapy Resuming Warfarin tonight  --> 10 mg po x 1 dose tonight Daily INR  Thank you Anette Guarneri, PharmD 12/18/2016,3:56 PM

## 2016-12-18 NOTE — Progress Notes (Signed)
Patient ID: Christian Bowman, male   DOB: 04-25-1946, 71 y.o.   MRN: [id number]   SUBJECTIVE:  No further chest pain, no dyspnea.  Walked in hall yesterday without problems. INR 1.36 today, on heparin gtt.   Scheduled Meds: . amiodarone  200 mg Oral QHS  . aspirin EC  81 mg Oral QPM  . carvedilol  6.25 mg Oral BID  . Chlorhexidine Gluconate Cloth  6 each Topical Q0600  . digoxin  0.0625 mg Oral Daily  . furosemide  20 mg Oral QODAY  . insulin aspart  0-15 Units Subcutaneous TID WC  . insulin aspart  0-5 Units Subcutaneous QHS  . mupirocin ointment  1 application Nasal BID  . rosuvastatin  10 mg Oral q1800  . sacubitril-valsartan  1 tablet Oral BID  . sodium chloride flush  3 mL Intravenous Q12H  . sodium chloride flush  3 mL Intravenous Q12H   Continuous Infusions: . sodium chloride    . heparin 1,500 Units/hr (12/17/16 1618)   PRN Meds:.sodium chloride, sodium chloride, acetaminophen, alum & mag hydroxide-simeth, magnesium hydroxide, nitroGLYCERIN, ondansetron (ZOFRAN) IV, sodium chloride flush, sodium chloride flush    Vitals:   12/17/16 1240 12/17/16 1357 12/17/16 2004 12/18/16 0516  BP: (!) 158/78 133/73 138/66 119/64  Pulse: 76 67 65 68  Resp: 17 20 18 18   Temp:  98.7 F (37.1 C) 97.6 F (36.4 C) 97.8 F (36.6 C)  TempSrc:  Oral Oral Oral  SpO2: 98% 97% 98% 96%  Weight:  171 lb 6.4 oz (77.7 kg)  171 lb 3.2 oz (77.7 kg)  Height:  6' (1.829 m)      Intake/Output Summary (Last 24 hours) at 12/18/16 0941 Last data filed at 12/17/16 1300  Gross per 24 hour  Intake              540 ml  Output              215 ml  Net              325 ml    LABS: Basic Metabolic Panel:  Recent Labs  12/17/16 0244 12/18/16 0406  NA 138 139  K 4.0 3.9  CL 103 103  CO2 27 28  GLUCOSE 106* 116*  BUN 29* 29*  CREATININE 1.40* 1.69*  CALCIUM 8.7* 8.7*   Liver Function Tests: No results for input(s): AST, ALT, ALKPHOS, BILITOT, PROT, ALBUMIN in the last 72 hours. No results for  input(s): LIPASE, AMYLASE in the last 72 hours. CBC:  Recent Labs  12/17/16 0244 12/18/16 0406  WBC 5.9 5.9  HGB 11.7* 11.2*  HCT 35.8* 34.2*  MCV 89.5 89.3  PLT 165 168   Cardiac Enzymes:  Recent Labs  12/15/16 0953 12/15/16 1623 12/15/16 2154  TROPONINI 0.51* 0.44* 0.36*   BNP: Invalid input(s): POCBNP D-Dimer: No results for input(s): DDIMER in the last 72 hours. Hemoglobin A1C:  Recent Labs  12/16/16 1824  HGBA1C 6.2*   Fasting Lipid Panel: No results for input(s): CHOL, HDL, LDLCALC, TRIG, CHOLHDL, LDLDIRECT in the last 72 hours. Thyroid Function Tests: No results for input(s): TSH, T4TOTAL, T3FREE, THYROIDAB in the last 72 hours.  Invalid input(s): FREET3 Anemia Panel: No results for input(s): VITAMINB12, FOLATE, FERRITIN, TIBC, IRON, RETICCTPCT in the last 72 hours.  RADIOLOGY: No results found.  PHYSICAL EXAM General: NAD Neck: No JVD, lipoma right neck, no thyromegaly or thyroid nodule.  Lungs: CTAB CV: Nondisplaced PMI.  Heart regular S1/S2, no S3/S4.  No murmur.  No peripheral edema.   Abdomen: Soft, nontender, no hepatosplenomegaly, no distention.  Neurologic: Alert and oriented x 3.  Psych: Normal affect. Extremities: No clubbing or cyanosis.   TELEMETRY: Personally reviewed telemetry pt in NSR with BiV pacing  ASSESSMENT AND PLAN: 71 yo with history of CAD s/p CABG and redo CABG + bioprosthetic MVR, Medtronic CRT-D, paroxysmal atrial flutter s/p DCCV, type II DM presented with NSTEMI.  1. CAD: NSTEMI.  Peak troponin here 0.5.  No further chest pain. Nuclear study from last week at [facility] showed dense inferior wall scar, mixed scar and ischemia in the anterolateral distribution - "reversible in the apical segments".  Potential sites for acute coronary event includes the distal LAD artery (diffusely diseased beyond LIMA insertion, probably responsible for apical reversible defect on [place] nuke) or the non-bypassed LCX (80% ostial  stenosis). - INR 1.36 today, ok for cath.  - Continue ASA 81 and statin.  - Coronary angiography today.  Risks/benefits of procedure discussed with patient, he agrees to procedure.  2. Chronic systolic CHF: Medtronic CRT-D.  Last echo 3/18 in [place] with EF 30-35%, bioprosthetic MV with mean gradient 5.  On exam, he does not appear volume overloaded.  Walked yesterday without dyspnea.  - Continue Lasix every other day.  - Continue Coreg and Entresto.  - Spironolactone was stopped with hypotensive episodes in the recent past.  - Continue digoxin, level ok from [place].  3. Atrial flutter: Paroxysmal.  In NSR here.  He is on amiodarone and anticoagulated.  Plan for eventual flutter ablation ([place]).  - After procedure, will need to go back on warfarin.  I do not think that he will have to go home on Lovenox bridge.  - If stent is placed, would favor clopidogrel use with warfarin.  Would continue ASA 81 only a short time after PCI.  4. CKD: Creatinine 1.69, slightly higher than yesterday but around his baseline.  Will give gentle hydration today, no Lasix. Will be careful with contrast on cath.  5. DM: Cover with sliding scale.   Loralie Champagne 12/18/2016 9:41 AM

## 2016-12-18 NOTE — Progress Notes (Signed)
ANTICOAGULATION CONSULT NOTE - Follow Up Consult  Pharmacy Consult for Heparin Indication: chest pain/ACS  Allergies  Allergen Reactions  . Xanax [Alprazolam] Other (See Comments)    Pt feels very weak, tired and feels paralyzed      Patient Measurements: Height: 6' (182.9 cm) Weight: 171 lb 3.2 oz (77.7 kg) IBW/kg (Calculated) : 77.6 Heparin Dosing Weight:  77.7 kg  Vital Signs: Temp: 97.8 F (36.6 C) (03/19 0516) Temp Source: Oral (03/19 0516) BP: 119/64 (03/19 0516) Pulse Rate: 68 (03/19 0516)  Labs:  Recent Labs  12/15/16 1623 12/15/16 2154  12/16/16 0314 12/16/16 1100 12/17/16 0244 12/18/16 0406  HGB  --   --   < > 12.2*  --  11.7* 11.2*  HCT  --   --   --  38.2*  --  35.8* 34.2*  PLT  --   --   --  158  --  165 168  LABPROT  --   --   --  22.1*  --  20.8* 16.8*  INR  --   --   --  1.91  --  1.76 1.36  HEPARINUNFRC 0.10* 0.29*  --   --  0.35 0.48 0.49  CREATININE  --   --   --  1.52*  --  1.40* 1.69*  TROPONINI 0.44* 0.36*  --   --   --   --   --   < > = values in this interval not displayed.  Estimated Creatinine Clearance: 45.3 mL/min (A) (by C-G formula based on SCr of 1.69 mg/dL (H)).   Assessment:  Anticoag: heparin for r/o acs; hx afib (nsr currently). On warfarin PTA. INR trending down. INR 1.36 today. HL 0.49. Hgb 11.2 trending down. Plts stable.  Goal of Therapy:  Heparin level 0.3-0.7 units/ml Monitor platelets by anticoagulation protocol: Yes   Plan:  Continue heparin at 1500 units/hr Daily HL and CBC Will f/u after cath   Borghild Thaker S. Alford Highland, PharmD, BCPS Clinical Staff Pharmacist Pager [phone]  Eilene Ghazi Stillinger 12/18/2016,10:59 AM

## 2016-12-18 NOTE — Interval H&P Note (Signed)
Cath Lab Visit (complete for each Cath Lab visit)  Clinical Evaluation Leading to the Procedure:   ACS: Yes.    Non-ACS:    Anginal Classification: CCS IV  Anti-ischemic medical therapy: Minimal Therapy (1 class of medications)  Non-Invasive Test Results: No non-invasive testing performed  Prior CABG: Previous CABG      History and Physical Interval Note:  12/18/2016 2:29 PM  Christian Bowman  has presented today for surgery, with the diagnosis of n stemi  The various methods of treatment have been discussed with the patient and family. After consideration of risks, benefits and other options for treatment, the patient has consented to  Procedure(s): Left Heart Cath and Cors/Grafts Angiography (N/A) as a surgical intervention .  The patient's history has been reviewed, patient examined, no change in status, stable for surgery.  I have reviewed the patient's chart and labs.  Questions were answered to the patient's satisfaction.     Sherren Mocha

## 2016-12-19 ENCOUNTER — Encounter (HOSPITAL_COMMUNITY): Payer: Medicare Other

## 2016-12-19 ENCOUNTER — Encounter (HOSPITAL_COMMUNITY): Payer: Self-pay

## 2016-12-19 ENCOUNTER — Ambulatory Visit (HOSPITAL_COMMUNITY): Payer: Medicare Other

## 2016-12-19 LAB — PROTIME-INR
INR: 1.25
Prothrombin Time: 15.8 seconds — ABNORMAL HIGH (ref 11.4–15.2)

## 2016-12-19 LAB — BASIC METABOLIC PANEL
Anion gap: 8 (ref 5–15)
BUN: 24 mg/dL — AB (ref 6–20)
CO2: 25 mmol/L (ref 22–32)
CREATININE: 1.48 mg/dL — AB (ref 0.61–1.24)
Calcium: 8.5 mg/dL — ABNORMAL LOW (ref 8.9–10.3)
Chloride: 105 mmol/L (ref 101–111)
GFR, EST AFRICAN AMERICAN: 54 mL/min — AB (ref >60)
GFR, EST NON AFRICAN AMERICAN: 47 mL/min — AB (ref >60)
Glucose, Bld: 115 mg/dL — ABNORMAL HIGH (ref 65–99)
POTASSIUM: 3.9 mmol/L (ref 3.5–5.1)
SODIUM: 138 mmol/L (ref 135–145)

## 2016-12-19 LAB — CBC
HEMATOCRIT: 34.9 % — AB (ref 39.0–52.0)
Hemoglobin: 11.3 g/dL — ABNORMAL LOW (ref 13.0–17.0)
MCH: 29 pg (ref 26.0–34.0)
MCHC: 32.4 g/dL (ref 30.0–36.0)
MCV: 89.7 fL (ref 78.0–100.0)
PLATELETS: 162 10*3/uL (ref 150–400)
RBC: 3.89 MIL/uL — AB (ref 4.22–5.81)
RDW: 15.6 % — ABNORMAL HIGH (ref 11.5–15.5)
WBC: 5.2 10*3/uL (ref 4.0–10.5)

## 2016-12-19 LAB — GLUCOSE, CAPILLARY: GLUCOSE-CAPILLARY: 91 mg/dL (ref 65–99)

## 2016-12-19 MED ORDER — SPIRONOLACTONE 25 MG PO TABS
12.5000 mg | ORAL_TABLET | Freq: Every day | ORAL | Status: DC
  Administered 2016-12-19: 12.5 mg via ORAL
  Filled 2016-12-19: qty 1

## 2016-12-19 MED ORDER — MUPIROCIN 2 % EX OINT: 1.0000 "application " | TOPICAL_OINTMENT | Freq: Two times a day (BID) | CUTANEOUS | 0 refills | Status: AC

## 2016-12-19 MED ORDER — SPIRONOLACTONE 25 MG PO TABS: 12.5000 mg | ORAL_TABLET | Freq: Every day | ORAL | 6 refills | Status: DC

## 2016-12-19 MED ORDER — WARFARIN SODIUM 5 MG PO TABS: ORAL_TABLET | ORAL | 3 refills | Status: DC

## 2016-12-19 MED ORDER — NITROGLYCERIN 0.4 MG SL SUBL: 0.4000 mg | SUBLINGUAL_TABLET | SUBLINGUAL | 12 refills | Status: DC | PRN

## 2016-12-19 NOTE — Discharge Summary (Signed)
Advanced Heart Failure Team  Discharge Summary   Patient ID: NAMEER Christian Bowman MRN: [id number], DOB/AGE: 10-13-1945 71 y.o. Admit date: 12/15/2016 D/C date:     12/19/2016   Primary Discharge Diagnoses:  1. Chronic systolic CHF 2. NSTEMI 3. Coronary artery disease 4. Atrial flutter 5. Bioprosthetic mitral valve 6. Chronic kidney disease stage III   Hospital Course: Mr. Christian Bowman is a 71 year old male with a past medical history of CAD s/p CABG and redo CABG + bioprosthetic MVR, Medtronic CRT-D, paroxysmal atrial flutter s/p DCCV, type II DM presented with NSTEMI on 12/15/16.   1. CAD: NSTEMI: Peak troponin 0.5. No further chest pain. Nuclear study from [facility] on 12/11/16 showed dense inferior wall scar, mixed scar and ischemia in the anterolateral distribution - "reversible in the apical segments". Underwent left and right heart cath on 12/18/16 - suspect cause for small NSTEMI was occlusion of diseased SVG-PDA from 1st CABG. SVG-PDA from 2nd CABG remains open. LIMA to LAD is patent. Has known total occlusion of apical LAD that is unchanged from prior cath.  - Continue ASA 81 and statin.  2. Chronic systolic CHF: Medtronic CRT-D. Last echo 3/18 in [place] with EF 30-35%, bioprosthetic MV with mean gradient 5. Volume status stable on home dose of Lasix 20mg  every other day. Remained on home HF med regimen.  - Continue Coreg 6.25mg  BID and Entresto 49/51mg  BID.  - Able to restart Arlyce Harman at 12.5mg  daily.  - Continue digoxin 0.0625mg , level ok from [place].  3. Atrial flutter: Paroxysmal.  In NSR here.  He is on amiodarone and anticoagulated.  Plan for eventual flutter ablation ([facility]).  - Warfarin restarted. INR 1.25 at discharge. He has 3 doses of Lovenox from prior admission that he can use. Continue coumadin clinic followup.  4. CKD: Creatinine stable. 5. Bioprosthetic mitral valve: Needs prescription for amoxicillin for dental work in near future.   Discharge Weight: 174 pounds Discharge  Vitals: Blood pressure (!) 151/78, pulse 85, temperature 97.7 F (36.5 C), temperature source Oral, resp. rate 18, height 6' (1.829 m), weight 174 lb 14.4 oz (79.3 kg), SpO2 97 %.  Labs: Lab Results  Component Value Date   WBC 5.2 12/19/2016   HGB 11.3 (L) 12/19/2016   HCT 34.9 (L) 12/19/2016   MCV 89.7 12/19/2016   PLT 162 12/19/2016     Recent Labs Lab 12/19/16 0323  NA 138  K 3.9  CL 105  CO2 25  BUN 24*  CREATININE 1.48*  CALCIUM 8.5*  GLUCOSE 115*   Lab Results  Component Value Date   CHOL 95 (L) 04/20/2016   HDL 28 (L) 04/20/2016   LDLCALC 45 04/20/2016   TRIG 99 07/17/2016   BNP (last 3 results)  Recent Labs  09/18/16 1232 10/16/16 1239 12/15/16 0953  BNP 3,909.3* 3,654.9* >4,500.0*      Diagnostic Studies/Procedures   Left Heart Cath and Cors/Grafts Angiography 12/18/16 1. Severe native three-vessel coronary artery disease with total occlusion of the LAD, total occlusion of the ramus intermedius, and total occlusion of the RCA 2. Continued patency of the LIMA to LAD with total occlusion of the apical LAD appearance unchanged from previous cath study 3. Patency of the most recent saphenous vein graft to PDA without significant stenosis present 4. Interval occlusion of the old saphenous vein graft PDA 5. Nonvisualization of the saphenous vein graft to diagonal, suspect total occlusion from nonselective imaging 6. Low LVEDP  Recommendation: Continue medical therapy. Resume warfarin tonight. The patients clinical  syndrome might be related to interval occlusion of his old vein graft to PDA. His troponin had only a mild elevation and this would be consistent with occlusion of a vein graft to a territory that is supplied by another graft with continued patency.   Diagnostic Diagram         Discharge Medications   Allergies as of 12/19/2016      Reactions   Xanax [alprazolam] Other (See Comments)   Pt feels very weak, tired and feels paralyzed         Medication List    TAKE these medications   amiodarone 200 MG tablet Commonly known as:  PACERONE Take 1 tablet (200 mg total) by mouth at bedtime.   aspirin EC 81 MG tablet Take 81 mg by mouth daily.   carvedilol 6.25 MG tablet Commonly known as:  COREG Take 1 tablet (6.25 mg total) by mouth 2 (two) times daily.   digoxin 0.125 MG tablet Commonly known as:  LANOXIN Take 0.5 tablets (0.0625 mg total) by mouth daily.   ENTRESTO 49-51 MG Generic drug:  sacubitril-valsartan TAKE ONE TABLET BY MOUTH TWICE DAILY   furosemide 20 MG tablet Commonly known as:  LASIX Take 1 tablet (20 mg total) by mouth every other day.   glucose blood test strip Commonly known as:  ACCU-CHEK AVIVA Use as instructed bid E11.65   metFORMIN 500 MG tablet Commonly known as:  GLUCOPHAGE Take 1 tablet (500 mg total) by mouth 2 (two) times daily with a meal.   mupirocin ointment 2 % Commonly known as:  BACTROBAN Place 1 application into the nose 2 (two) times daily.   nitroGLYCERIN 0.4 MG SL tablet Commonly known as:  NITROSTAT Place 1 tablet (0.4 mg total) under the tongue every 5 (five) minutes x 3 doses as needed for chest pain.   rosuvastatin 10 MG tablet Commonly known as:  CRESTOR TAKE ONE TABLET BY MOUTH ONCE DAILY   spironolactone 25 MG tablet Commonly known as:  ALDACTONE Take 0.5 tablets (12.5 mg total) by mouth daily.   warfarin 5 MG tablet Commonly known as:  COUMADIN Take 10 mg 3/20 3/21 then take 5 mg daily Sun, Tues, Wed, Fri, and Sat. Take 7.5 mg on all other days. What changed:  how much to take  how to take this  when to take this  additional instructions       Disposition   The patient will be discharged in stable condition to home. Discharge Instructions    Diet - low sodium heart healthy    Complete by:  As directed    Increase activity slowly    Complete by:  As directed      Follow-up Information    [facility] Follow up on  12/25/2016.   Specialty:  Cardiology Why:  at 11:30 Coumadin Follow up.  Contact information: [place]       Loralie Champagne, MD Follow up on 01/11/2017.   Specialty:  Cardiology Why:  at 2:20 Garage Code 6000 Contact information: [address] [place] [address] [phone]             Duration of Discharge Encounter: Greater than 35 minutes   Signed, Arbutus Leas  12/19/2016, 12:21 PM

## 2016-12-19 NOTE — Care Management Note (Signed)
Case Management Note Marvetta Gibbons RN, BSN Unit 2W-Case Manager [phone]  Patient Details  Name: Christian Bowman MRN: [id number] Date of Birth: November 18, 1945  Subjective/Objective:   Pt admitted with NSTEMI s/p cath                 Action/Plan: PTA pt lived at home with spouse- plan to return home with spouse- no CM needs noted for discharge  Expected Discharge Date:  12/19/16               Expected Discharge Plan:  Home/Self Care  In-House Referral:     Discharge planning Services  CM Consult  Post Acute Care Choice:  NA Choice offered to:  NA  DME Arranged:    DME Agency:     HH Arranged:    [place] Agency:     Status of Service:  Completed, signed off  If discussed at [employer] of Stay Meetings, dates discussed:    Additional Comments:  Dawayne Patricia, RN 12/19/2016, 9:57 AM

## 2016-12-19 NOTE — Progress Notes (Signed)
Discussed with the patient and all questioned fully answered. He will call me if any problems arise.  IV removed. Telemetry removed. Pt and wife verbalized understanding of all discharge instructions.   Fritz Pickerel, RN

## 2016-12-19 NOTE — Progress Notes (Signed)
Patient ID: Christian Bowman, male   DOB: [birth year], 71 y.o.   MRN: [id number]   SUBJECTIVE:  No further chest pain, no dyspnea.  Creatinine stable.  Anxious to go home.   LHC (3/19): Only change from prior was complete occlusion of severely diseased old SVG-PDA.  SVG-PDA from 2nd CABG is still open.   Scheduled Meds: . amiodarone  200 mg Oral QHS  . aspirin EC  81 mg Oral QPM  . carvedilol  6.25 mg Oral BID  . Chlorhexidine Gluconate Cloth  6 each Topical Q0600  . digoxin  0.0625 mg Oral Daily  . furosemide  20 mg Oral QODAY  . insulin aspart  0-15 Units Subcutaneous TID WC  . insulin aspart  0-5 Units Subcutaneous QHS  . mupirocin ointment  1 application Nasal BID  . rosuvastatin  10 mg Oral q1800  . sacubitril-valsartan  1 tablet Oral BID  . sodium chloride flush  3 mL Intravenous Q12H  . spironolactone  12.5 mg Oral Daily  . Warfarin - Pharmacist Dosing Inpatient   Does not apply q1800   Continuous Infusions: . heparin 1,500 Units/hr (12/18/16 0951)   PRN Meds:.sodium chloride, acetaminophen, alum & mag hydroxide-simeth, magnesium hydroxide, nitroGLYCERIN, ondansetron (ZOFRAN) IV, sodium chloride flush    Vitals:   12/18/16 1630 12/18/16 1700 12/18/16 2113 12/19/16 0408  BP: 128/66 (!) 146/72 112/62 (!) 151/78  Pulse: (!) 59 62 62 (!) 59  Resp:   18 18  Temp:   97.9 F (36.6 C) 97.7 F (36.5 C)  TempSrc:   Oral Oral  SpO2:   97% 97%  Weight:    174 lb 14.4 oz (79.3 kg)  Height:        Intake/Output Summary (Last 24 hours) at 12/19/16 0901 Last data filed at 12/18/16 2015  Gross per 24 hour  Intake              240 ml  Output              875 ml  Net             -635 ml    LABS: Basic Metabolic Panel:  Recent Labs  12/18/16 0406 12/19/16 0323  NA 139 138  K 3.9 3.9  CL 103 105  CO2 28 25  GLUCOSE 116* 115*  BUN 29* 24*  CREATININE 1.69* 1.48*  CALCIUM 8.7* 8.5*   Liver Function Tests: No results for input(s): AST, ALT, ALKPHOS, BILITOT, PROT, ALBUMIN  in the last 72 hours. No results for input(s): LIPASE, AMYLASE in the last 72 hours. CBC:  Recent Labs  12/18/16 0406 12/19/16 0323  WBC 5.9 5.2  HGB 11.2* 11.3*  HCT 34.2* 34.9*  MCV 89.3 89.7  PLT 168 162   Cardiac Enzymes: No results for input(s): CKTOTAL, CKMB, CKMBINDEX, TROPONINI in the last 72 hours. BNP: Invalid input(s): POCBNP D-Dimer: No results for input(s): DDIMER in the last 72 hours. Hemoglobin A1C:  Recent Labs  12/16/16 1824  HGBA1C 6.2*   Fasting Lipid Panel: No results for input(s): CHOL, HDL, LDLCALC, TRIG, CHOLHDL, LDLDIRECT in the last 72 hours. Thyroid Function Tests: No results for input(s): TSH, T4TOTAL, T3FREE, THYROIDAB in the last 72 hours.  Invalid input(s): FREET3 Anemia Panel: No results for input(s): VITAMINB12, FOLATE, FERRITIN, TIBC, IRON, RETICCTPCT in the last 72 hours.  RADIOLOGY: No results found.  PHYSICAL EXAM General: NAD Neck: JVP 7 cm, lipoma right neck, no thyromegaly or thyroid nodule.  Lungs: CTAB CV: Nondisplaced  PMI.  Heart regular S1/S2, no S3/S4.  No murmur.  No peripheral edema.   Abdomen: Soft, nontender, no hepatosplenomegaly, no distention.  Neurologic: Alert and oriented x 3.  Psych: Normal affect. Extremities: No clubbing or cyanosis.   TELEMETRY: Personally reviewed telemetry pt in NSR with BiV pacing  ASSESSMENT AND PLAN: 71 yo with history of CAD s/p CABG and redo CABG + bioprosthetic MVR, Medtronic CRT-D, paroxysmal atrial flutter s/p DCCV, type II DM presented with NSTEMI.  1. CAD: NSTEMI.  Peak troponin here 0.5.  No further chest pain. Nuclear study from last week at [facility] showed dense inferior wall scar, mixed scar and ischemia in the anterolateral distribution - "reversible in the apical segments".  Cath yesterday, suspect cause for small NSTEMI was occlusion of diseased SVG-PDA from 1st CABG.  SVG-PDA from 2nd CABG remains open. - Continue ASA 81 and statin.  2. Chronic systolic CHF: Medtronic  CRT-D.  Last echo 3/18 in [place] with EF 30-35%, bioprosthetic MV with mean gradient 5.  On exam, he does not appear volume overloaded.  Walked yesterday without dyspnea.  - Continue Lasix every other day.  - Continue Coreg and Entresto.  - Restart spironolactone at lower dose, 12.5 daily.  - Continue digoxin, level ok from [place].  3. Atrial flutter: Paroxysmal.  In NSR here.  He is on amiodarone and anticoagulated.  Plan for eventual flutter ablation ([facility]).  - Warfarin restarted.  He has 3 doses of Lovenox from prior admission that he can use.  Continue coumadin clinic followup.  4. CKD: Creatinine stable. 5. Bioprosthetic mitral valve: Needs prescription for amoxicillin for dental work in near future.  6. Disposition: May go home today.  Followup with me 2 wks and with home coumadin clinic.  Meds for home: Needs prescription for prn NTG and for amoxicillin 2000 mg x 1 to use prior to dental work.  Restart warfarin, can take the 3 doses of Lovenox that he has at home (otherwise no further bridging).  Otherwise, continue his same prior to admission meds except will add spironolactone 12.5 daily.   Loralie Champagne 12/19/2016 9:01 AM

## 2016-12-19 NOTE — Progress Notes (Signed)
CARDIAC REHAB PHASE I   PRE:  Rate/Rhythm: 63 paced, RN removed tele monitor for d/c  BP:  Sitting: 151/75        SaO2: 98 RA  MODE:  Ambulation: 790 ft   POST:  Rate/Rhythm: NT  BP:  Sitting: 181/99, 154/80 recheck        SaO2: 99 RA  Pt ambulated 790 ft on RA, independent, steady gait, tolerated well with no complaints. Completed MI/CHF education with pt and wife at bedside. Reviewed risk factors, MI book, activity restrictions, ntg, exercise, heart healthy diet, carb counting, sodium restrictions, daily weights, CHF book and zone tool, and phase 2 cardiac rehab. Pt and wife verbalized understanding. Pt is currently in phase 2 cardiac rehab at [facility] in [place], [place]. Pt has signed letter from MD to return Friday. Pt up ad lib in room, awaiting discharge.   [phone] Lenna Sciara, RN, BSN 12/19/2016 10:40 AM

## 2016-12-22 ENCOUNTER — Ambulatory Visit (INDEPENDENT_AMBULATORY_CARE_PROVIDER_SITE_OTHER): Payer: Medicare Other | Admitting: "Endocrinology

## 2016-12-22 ENCOUNTER — Encounter: Payer: Self-pay | Admitting: "Endocrinology

## 2016-12-22 ENCOUNTER — Ambulatory Visit: Payer: Medicare Other | Admitting: Cardiovascular Disease

## 2016-12-22 VITALS — BP 146/80 | HR 83 | Ht 72.0 in | Wt 179.0 lb

## 2016-12-22 DIAGNOSIS — I25810 Atherosclerosis of coronary artery bypass graft(s) without angina pectoris: Secondary | ICD-10-CM

## 2016-12-22 DIAGNOSIS — E1159 Type 2 diabetes mellitus with other circulatory complications: Secondary | ICD-10-CM

## 2016-12-22 DIAGNOSIS — E782 Mixed hyperlipidemia: Secondary | ICD-10-CM | POA: Diagnosis not present

## 2016-12-22 DIAGNOSIS — I10 Essential (primary) hypertension: Secondary | ICD-10-CM | POA: Diagnosis not present

## 2016-12-22 MED ORDER — ACCU-CHEK SOFTCLIX LANCETS MISC: 2 refills | Status: DC

## 2016-12-22 MED ORDER — METFORMIN HCL 500 MG PO TABS: 500.0000 mg | ORAL_TABLET | Freq: Two times a day (BID) | ORAL | 1 refills | Status: DC

## 2016-12-22 NOTE — Progress Notes (Signed)
Subjective:    Patient ID: Christian Bowman, male    DOB: [birth year],    Past Medical History:  Diagnosis Date  . Anginal pain ([place])   . CAD (coronary artery disease)   . Coronary artery disease involving coronary bypass graft   . Cyst of neck    right side  . DM2 (diabetes mellitus, type 2) ([place]) 08/26/2013  . Dyspnea   . Heart attack   . HTN (hypertension) 08/26/2013  . Hyperlipidemia 08/26/2013  . Left main coronary artery disease   . Left renal artery stenosis ([place])    Genesis 6x12 stent 2007  . Obesity (BMI 30.0-34.9) 08/26/2013  . Postoperative atrial fibrillation ([place]) 10/15/2005  . S/P CABG x 4 10/13/2005   LIMA to LAD, SVG to intermediate branch, sequential SVG to PDA and RPL branch, EVH via right thigh  . S/P mitral valve replacement with bioprosthetic valve 07/11/2016   31 mm [facility] Mitral bovine bioprosthetic tissue valve  . S/P redo CABG x 2 07/11/2016   SVG to PDA and SVG to Intermediate Branch, EVH via left thigh   Past Surgical History:  Procedure Laterality Date  . CARDIAC CATHETERIZATION N/A 06/21/2016   Procedure: Right/Left Heart Cath and Coronary/Graft Angiography;  Surgeon: Sherren Mocha, MD;  Location: [place] CV LAB;  Service: Cardiovascular;  Laterality: N/A;  . CARDIOVERSION N/A 07/19/2016   Procedure: CARDIOVERSION;  Surgeon: Lelon Perla, MD;  Location: [facility] ENDOSCOPY;  Service: Cardiovascular;  Laterality: N/A;  . CARDIOVERSION N/A 09/08/2016   Procedure: CARDIOVERSION;  Surgeon: Fay Records, MD;  Location: [facility] ENDOSCOPY;  Service: Cardiovascular;  Laterality: N/A;  . CORONARY ARTERY BYPASS GRAFT  10/13/2005   LIMA to LAD, SVG to intermediate branch, sequential SVG to PDA and RPL  . CORONARY ARTERY BYPASS GRAFT N/A 07/11/2016   Procedure: REDO CORONARY ARTERY BYPASS GRAFTING (CABG) x two using left leg greater saphenous vein harvested endoscopically-SVG to PDA -SVG to RAMUS INTERMEDIATE;  Surgeon: Rexene Alberts, MD;  Location: [place];   Service: Open Heart Surgery;  Laterality: N/A;  . CORONARY ARTERY BYPASS GRAFT N/A 07/11/2016   Procedure: Re-exploration (CABG) for post op bleeding,;  Surgeon: Rexene Alberts, MD;  Location: [place];  Service: Open Heart Surgery;  Laterality: N/A;  . EP IMPLANTABLE DEVICE N/A 07/25/2016   Procedure: BiV ICD Insertion CRT-D;  Surgeon: Will Meredith Leeds, MD;  Location: [place] CV LAB;  Service: Cardiovascular;  Laterality: N/A;  . LEFT HEART CATH AND CORS/GRAFTS ANGIOGRAPHY N/A 12/18/2016   Procedure: Left Heart Cath and Cors/Grafts Angiography;  Surgeon: Sherren Mocha, MD;  Location: [place] CV LAB;  Service: Cardiovascular;  Laterality: N/A;  . MITRAL VALVE REPLACEMENT N/A 07/11/2016   Procedure: MITRAL VALVE (MV) REPLACEMENT;  Surgeon: Rexene Alberts, MD;  Location: [place];  Service: Open Heart Surgery;  Laterality: N/A;  . MYOCARDICAL PERFUSION  10/09/2007   NORMAL PERFUSION IN ALL REGIONS;NO EVIDENCE OF INDUCIBLE ISCHEMIA;POST STRESS EF% 66  . PERCUTANEOUS CORONARY STENT INTERVENTION (PCI-S)     DES in SVG to right coronary artery system  . RENAL ARTERY STENT Right 2007  . RENAL DOPPLER  03/28/2010   RIGHT RA-NORMAL;LEFT PROXIMAL RA AT STENT-PATENT WITH NO EVIDENCE OF SIGN DIAMETER REDUCTION. R & L KIDNEYS: EQUAL IN SIZE,SYMMETRICAL IN SHAPE.  . TEE WITHOUT CARDIOVERSION N/A 06/15/2016   Procedure: TRANSESOPHAGEAL ECHOCARDIOGRAM (TEE);  Surgeon: Sanda Klein, MD;  Location: [facility] ENDOSCOPY;  Service: Cardiovascular;  Laterality: N/A;  . TEE WITHOUT  CARDIOVERSION N/A 07/11/2016   Procedure: TRANSESOPHAGEAL ECHOCARDIOGRAM (TEE);  Surgeon: Rexene Alberts, MD;  Location: [place];  Service: Open Heart Surgery;  Laterality: N/A;  . TEE WITHOUT CARDIOVERSION N/A 07/19/2016   Procedure: TRANSESOPHAGEAL ECHOCARDIOGRAM (TEE);  Surgeon: Lelon Perla, MD;  Location: [facility] ENDOSCOPY;  Service: Cardiovascular;  Laterality: N/A;  . TRANSESOPHAGEAL ECHOCARDIOGRAM  10/19/2005   NORMAL LV; MILD TO  MODERATE AMOUNT OF SOFT ATHEROMATOUS PLAQUE OF THE THORACIC AORTA; THE LEFT ATRIUM IS MILDLY DILATED;LEFT ATRIAL APPENDAGE FUNCTION IS NORMAL;NO THROMBUS IDENTIFIED. SMALL PFO WITH RIGHT TO LEFT SHUNT   Social History   Social History  . Marital status: Married    Spouse name: N/A  . Number of children: N/A  . Years of education: N/A   Social History Main Topics  . Smoking status: Never Smoker  . Smokeless tobacco: Never Used  . Alcohol use No  . Drug use: No  . Sexual activity: Not Currently   Other Topics Concern  . None   Social History Narrative  . None   Outpatient Encounter Prescriptions as of 12/22/2016  Medication Sig  . ACCU-CHEK SOFTCLIX LANCETS lancets Use as instructed  . amiodarone (PACERONE) 200 MG tablet Take 1 tablet (200 mg total) by mouth at bedtime.  [name] aspirin EC 81 MG tablet Take 81 mg by mouth daily.   . carvedilol (COREG) 6.25 MG tablet Take 1 tablet (6.25 mg total) by mouth 2 (two) times daily.  . digoxin (LANOXIN) 0.125 MG tablet Take 0.5 tablets (0.0625 mg total) by mouth daily.  [name] ENTRESTO 49-51 MG TAKE ONE TABLET BY MOUTH TWICE DAILY  . furosemide (LASIX) 20 MG tablet Take 1 tablet (20 mg total) by mouth every other day.  . metFORMIN (GLUCOPHAGE) 500 MG tablet Take 1 tablet (500 mg total) by mouth 2 (two) times daily with a meal.  . nitroGLYCERIN (NITROSTAT) 0.4 MG SL tablet Place 1 tablet (0.4 mg total) under the tongue every 5 (five) minutes x 3 doses as needed for chest pain.  . rosuvastatin (CRESTOR) 10 MG tablet TAKE ONE TABLET BY MOUTH ONCE DAILY  . spironolactone (ALDACTONE) 25 MG tablet Take 0.5 tablets (12.5 mg total) by mouth daily.  [name] warfarin (COUMADIN) 5 MG tablet Take 10 mg 3/20 3/21 then take 5 mg daily Sun, Tues, Wed, Fri, and Sat. Take 7.5 mg on all other days.  . [DISCONTINUED] glucose blood (ACCU-CHEK AVIVA) test strip Use as instructed bid E11.65 (Patient not taking: Reported on 12/15/2016)  . [DISCONTINUED] metFORMIN (GLUCOPHAGE) 500  MG tablet Take 1 tablet (500 mg total) by mouth 2 (two) times daily with a meal.   No facility-administered encounter medications on file as of 12/22/2016.    ALLERGIES: Allergies  Allergen Reactions  . Xanax [Alprazolam] Other (See Comments)    Pt feels very weak, tired and feels paralyzed     VACCINATION STATUS: Immunization History  Administered Date(s) Administered  . Influenza,inj,Quad PF,36+ Mos 08/01/2016    Diabetes  He presents for his follow-up diabetic visit. He has type 2 diabetes mellitus. Onset time: He was diagnosed at approximate age of 48 years. His disease course has been improving. There are no hypoglycemic associated symptoms. Pertinent negatives for hypoglycemia include no confusion, headaches, pallor or seizures. There are no diabetic associated symptoms. Pertinent negatives for diabetes include no chest pain, no fatigue, no polydipsia, no polyphagia, no polyuria and no weakness. There are no hypoglycemic complications. Symptoms are improving. Diabetic complications include heart disease. Risk factors for coronary  artery disease include diabetes mellitus, dyslipidemia, hypertension, male sex and sedentary lifestyle. Current diabetic treatment includes oral agent (dual therapy). He is compliant with treatment most of the time. His weight is decreasing steadily (He lost 15 pounds since December 2016.). He is following a diabetic diet. He participates in exercise intermittently. His home blood glucose trend is decreasing steadily. His overall blood glucose range is 130-140 mg/dl. An ACE inhibitor/angiotensin II receptor blocker is being taken.  Hyperlipidemia  This is a chronic problem. The current episode started more than 1 year ago. Exacerbating diseases include diabetes. Pertinent negatives include no chest pain, myalgias or shortness of breath. Current antihyperlipidemic treatment includes statins. Risk factors for coronary artery disease include diabetes mellitus,  dyslipidemia, hypertension and male sex.  Hypertension  This is a chronic problem. The current episode started more than 1 year ago. Pertinent negatives include no chest pain, headaches, neck pain, palpitations or shortness of breath. Risk factors for coronary artery disease include diabetes mellitus and dyslipidemia. Hypertensive end-organ damage includes CAD/MI.    Review of Systems  Constitutional: Negative for fatigue and unexpected weight change.  HENT: Negative for dental problem, mouth sores and trouble swallowing.   Eyes: Negative for visual disturbance.  Respiratory: Negative for cough, choking, chest tightness, shortness of breath and wheezing.   Cardiovascular: Negative for chest pain, palpitations and leg swelling.  Gastrointestinal: Negative for abdominal distention, abdominal pain, constipation, diarrhea, nausea and vomiting.  Endocrine: Negative for polydipsia, polyphagia and polyuria.  Genitourinary: Negative for dysuria, flank pain, hematuria and urgency.  Musculoskeletal: Negative for back pain, gait problem, myalgias and neck pain.  Skin: Negative for pallor, rash and wound.  Neurological: Negative for seizures, syncope, weakness, numbness and headaches.  Psychiatric/Behavioral: Negative.  Negative for confusion and dysphoric mood.    Objective:    BP (!) 146/80   Pulse 83   Ht 6' (1.829 m)   Wt 179 lb (81.2 kg)   BMI 24.28 kg/m   Wt Readings from Last 3 Encounters:  12/22/16 179 lb (81.2 kg)  12/19/16 174 lb 14.4 oz (79.3 kg)  10/26/16 173 lb 6.4 oz (78.7 kg)    Physical Exam  Constitutional: He is oriented to person, place, and time. He appears well-developed and well-nourished. He is cooperative. No distress.  HENT:  Head: Normocephalic and atraumatic.  Eyes: EOM are normal.  Neck: Normal range of motion. Neck supple. No tracheal deviation present. No thyromegaly present.  Cardiovascular: Normal rate, S1 normal, S2 normal and normal heart sounds.  Exam  reveals no gallop.   No murmur heard. Pulses:      Dorsalis pedis pulses are 1+ on the right side, and 1+ on the left side.       Posterior tibial pulses are 1+ on the right side, and 1+ on the left side.  Pulmonary/Chest: Breath sounds normal. No respiratory distress. He has no wheezes.  Abdominal: Soft. Bowel sounds are normal. He exhibits no distension. There is no tenderness. There is no guarding and no CVA tenderness.  Musculoskeletal: He exhibits no edema.       Right shoulder: He exhibits no swelling and no deformity.  Neurological: He is alert and oriented to person, place, and time. He has normal strength and normal reflexes. No cranial nerve deficit or sensory deficit. Gait normal.  Skin: Skin is warm and dry. No rash noted. No cyanosis. Nails show no clubbing.  Psychiatric: He has a normal mood and affect. His speech is normal and behavior is normal.  Judgment and thought content normal. Cognition and memory are normal.     Chemistry (most recent): Lab Results  Component Value Date   NA 138 12/19/2016   K 3.9 12/19/2016   CL 105 12/19/2016   CO2 25 12/19/2016   BUN 24 (H) 12/19/2016   CREATININE 1.48 (H) 12/19/2016  Results for LEOTHA, VOELTZ (MRN [id number]) as of 12/22/2016 11:13  Ref. Range 08/05/2015 00:00 11/04/2015 08:09 04/20/2016 08:05 07/07/2016 13:58 12/16/2016 18:24  Hemoglobin A1C Latest Ref Range: 4.8 - 5.6 % 6.6 (A) 6.2 (H) 5.8 (H) 6.0 (H) 6.2 (H)    Assessment & Plan:   1. Type 2 diabetes mellitus with other circulatory complication (HCC)   his diabetes is  complicated by coronary artery disease status post coronary artery bypass graft and recent ACS and patient remains at a high risk for more acute and chronic complications of diabetes which include CAD, CVA, CKD, retinopathy, and neuropathy. These are all discussed in detail with the patient.  He Continues to do well, his A1c is 6.2%-acceptable.     Recent labs reviewed, slightly increased serum creatinine. -  I have re-counseled the patient on diet management  by adopting a carbohydrate restricted / protein rich  Diet.  - Suggestion is made for patient to avoid simple carbohydrates   from their diet including Cakes , Desserts, Ice Cream,  Soda (  diet and regular) , Sweet Tea , Candies,  Chips, Cookies, Artificial Sweeteners,   and "Sugar-free" Products .  This will help patient to have stable blood glucose profile and potentially avoid unintended  Weight gain.  - Patient is advised to stick to a routine mealtimes to eat 3 meals  a day and avoid unnecessary snacks ( to snack only to correct hypoglycemia).  - The patient  will be  scheduled with Jearld Fenton, RDN, CDE for individualized DM education.  - I have approached patient with the following individualized plan to manage diabetes and patient agrees.  -   I will continue  metformin  500 mg by mouth twice a day.  - He will not need insulin treatment at this point.  - Patient specific target  for A1c; LDL, HDL, Triglycerides, and  Waist Circumference were discussed in detail.  2) BP/HTN: uncontrolled.  reportedly he runs low blood pressure at home and would not tolerate any additional therapy. Continue current medications including  beta blocker, spironolactone, and Lasix .  3) Lipids/HPL:  continue statins. 4)  Weight/Diet: CDE consult in progress, exercise, and carbohydrates information provided. 5 ) vitamin D deficiency: He is status post therapy with  vitamin D 50,000 units weekly for 12 weeks.  6) Chronic Care/Health Maintenance:  -Patient is  on ACEI/ARB and Statin medications and encouraged to continue to follow up with Ophthalmology, Podiatrist at least yearly or according to recommendations, and advised to  stay away from smoking. I have recommended yearly flu vaccine and pneumonia vaccination at least every 5 years; moderate intensity exercise for up to 150 minutes weekly; and  sleep for at least 7 hours a day.  I advised patient to  maintain close follow up with their PCP for primary care needs.  Patient is asked to bring meter and  blood glucose logs during their next visit.   Follow up plan: Return in about 6 months (around 06/24/2017) for follow up with pre-visit labs.  Glade Lloyd, MD Phone: [phone]  Fax: [phone]   12/22/2016, 11:29 AM

## 2016-12-25 ENCOUNTER — Ambulatory Visit (INDEPENDENT_AMBULATORY_CARE_PROVIDER_SITE_OTHER): Payer: Medicare Other | Admitting: *Deleted

## 2016-12-25 DIAGNOSIS — Z953 Presence of xenogenic heart valve: Secondary | ICD-10-CM | POA: Diagnosis not present

## 2016-12-25 DIAGNOSIS — Z5181 Encounter for therapeutic drug level monitoring: Secondary | ICD-10-CM

## 2016-12-25 DIAGNOSIS — I483 Typical atrial flutter: Secondary | ICD-10-CM | POA: Diagnosis not present

## 2016-12-25 DIAGNOSIS — I25810 Atherosclerosis of coronary artery bypass graft(s) without angina pectoris: Secondary | ICD-10-CM

## 2016-12-25 LAB — POCT INR: INR: 1.6

## 2016-12-26 ENCOUNTER — Other Ambulatory Visit: Payer: Self-pay

## 2016-12-26 MED ORDER — ACCU-CHEK SOFTCLIX LANCETS MISC
2 refills | Status: DC
Start: 2016-12-26 — End: 2019-07-28

## 2016-12-27 DIAGNOSIS — I252 Old myocardial infarction: Secondary | ICD-10-CM | POA: Diagnosis not present

## 2016-12-27 DIAGNOSIS — Z9889 Other specified postprocedural states: Secondary | ICD-10-CM | POA: Diagnosis not present

## 2016-12-27 DIAGNOSIS — Z951 Presence of aortocoronary bypass graft: Secondary | ICD-10-CM | POA: Diagnosis not present

## 2016-12-27 DIAGNOSIS — Z9861 Coronary angioplasty status: Secondary | ICD-10-CM | POA: Diagnosis not present

## 2016-12-28 ENCOUNTER — Encounter (HOSPITAL_COMMUNITY): Payer: Medicare Other

## 2016-12-28 ENCOUNTER — Encounter (HOSPITAL_COMMUNITY): Payer: Self-pay

## 2016-12-29 DIAGNOSIS — I252 Old myocardial infarction: Secondary | ICD-10-CM | POA: Diagnosis not present

## 2016-12-29 DIAGNOSIS — Z951 Presence of aortocoronary bypass graft: Secondary | ICD-10-CM | POA: Diagnosis not present

## 2016-12-29 DIAGNOSIS — Z9889 Other specified postprocedural states: Secondary | ICD-10-CM | POA: Diagnosis not present

## 2016-12-29 DIAGNOSIS — Z9861 Coronary angioplasty status: Secondary | ICD-10-CM | POA: Diagnosis not present

## 2016-12-31 DIAGNOSIS — Z951 Presence of aortocoronary bypass graft: Secondary | ICD-10-CM | POA: Diagnosis not present

## 2016-12-31 DIAGNOSIS — Z9889 Other specified postprocedural states: Secondary | ICD-10-CM | POA: Diagnosis not present

## 2016-12-31 DIAGNOSIS — I252 Old myocardial infarction: Secondary | ICD-10-CM | POA: Diagnosis not present

## 2016-12-31 DIAGNOSIS — Z9861 Coronary angioplasty status: Secondary | ICD-10-CM | POA: Diagnosis not present

## 2017-01-01 ENCOUNTER — Ambulatory Visit (INDEPENDENT_AMBULATORY_CARE_PROVIDER_SITE_OTHER): Payer: Medicare Other | Admitting: *Deleted

## 2017-01-01 DIAGNOSIS — Z5181 Encounter for therapeutic drug level monitoring: Secondary | ICD-10-CM

## 2017-01-01 DIAGNOSIS — I483 Typical atrial flutter: Secondary | ICD-10-CM | POA: Diagnosis not present

## 2017-01-01 DIAGNOSIS — Z953 Presence of xenogenic heart valve: Secondary | ICD-10-CM | POA: Diagnosis not present

## 2017-01-01 DIAGNOSIS — I25810 Atherosclerosis of coronary artery bypass graft(s) without angina pectoris: Secondary | ICD-10-CM

## 2017-01-01 LAB — POCT INR: INR: 2.6

## 2017-01-03 DIAGNOSIS — Z9889 Other specified postprocedural states: Secondary | ICD-10-CM | POA: Diagnosis not present

## 2017-01-03 DIAGNOSIS — Z9861 Coronary angioplasty status: Secondary | ICD-10-CM | POA: Diagnosis not present

## 2017-01-03 DIAGNOSIS — Z951 Presence of aortocoronary bypass graft: Secondary | ICD-10-CM | POA: Diagnosis not present

## 2017-01-03 DIAGNOSIS — I252 Old myocardial infarction: Secondary | ICD-10-CM | POA: Diagnosis not present

## 2017-01-05 DIAGNOSIS — I252 Old myocardial infarction: Secondary | ICD-10-CM | POA: Diagnosis not present

## 2017-01-05 DIAGNOSIS — Z951 Presence of aortocoronary bypass graft: Secondary | ICD-10-CM | POA: Diagnosis not present

## 2017-01-05 DIAGNOSIS — Z9861 Coronary angioplasty status: Secondary | ICD-10-CM | POA: Diagnosis not present

## 2017-01-05 DIAGNOSIS — Z9889 Other specified postprocedural states: Secondary | ICD-10-CM | POA: Diagnosis not present

## 2017-01-08 DIAGNOSIS — Z9889 Other specified postprocedural states: Secondary | ICD-10-CM | POA: Diagnosis not present

## 2017-01-08 DIAGNOSIS — Z951 Presence of aortocoronary bypass graft: Secondary | ICD-10-CM | POA: Diagnosis not present

## 2017-01-08 DIAGNOSIS — Z9861 Coronary angioplasty status: Secondary | ICD-10-CM | POA: Diagnosis not present

## 2017-01-08 DIAGNOSIS — I252 Old myocardial infarction: Secondary | ICD-10-CM | POA: Diagnosis not present

## 2017-01-10 DIAGNOSIS — Z9861 Coronary angioplasty status: Secondary | ICD-10-CM | POA: Diagnosis not present

## 2017-01-10 DIAGNOSIS — Z9889 Other specified postprocedural states: Secondary | ICD-10-CM | POA: Diagnosis not present

## 2017-01-10 DIAGNOSIS — Z951 Presence of aortocoronary bypass graft: Secondary | ICD-10-CM | POA: Diagnosis not present

## 2017-01-10 DIAGNOSIS — I252 Old myocardial infarction: Secondary | ICD-10-CM | POA: Diagnosis not present

## 2017-01-11 ENCOUNTER — Encounter (HOSPITAL_COMMUNITY): Payer: Self-pay

## 2017-01-11 ENCOUNTER — Ambulatory Visit (HOSPITAL_COMMUNITY)
Admission: RE | Admit: 2017-01-11 | Discharge: 2017-01-11 | Disposition: A | Discharge status: Home / Self Care | Payer: Medicare Other | Source: Ambulatory Visit | Attending: Cardiology | Admitting: Cardiology

## 2017-01-11 VITALS — BP 142/88 | HR 68 | Wt 171.0 lb

## 2017-01-11 DIAGNOSIS — I483 Typical atrial flutter: Secondary | ICD-10-CM

## 2017-01-11 DIAGNOSIS — I255 Ischemic cardiomyopathy: Secondary | ICD-10-CM | POA: Insufficient documentation

## 2017-01-11 DIAGNOSIS — Z7901 Long term (current) use of anticoagulants: Secondary | ICD-10-CM | POA: Diagnosis not present

## 2017-01-11 DIAGNOSIS — E1122 Type 2 diabetes mellitus with diabetic chronic kidney disease: Secondary | ICD-10-CM | POA: Diagnosis not present

## 2017-01-11 DIAGNOSIS — I252 Old myocardial infarction: Secondary | ICD-10-CM | POA: Diagnosis not present

## 2017-01-11 DIAGNOSIS — I5022 Chronic systolic (congestive) heart failure: Secondary | ICD-10-CM | POA: Diagnosis not present

## 2017-01-11 DIAGNOSIS — I25810 Atherosclerosis of coronary artery bypass graft(s) without angina pectoris: Secondary | ICD-10-CM | POA: Insufficient documentation

## 2017-01-11 DIAGNOSIS — Z7984 Long term (current) use of oral hypoglycemic drugs: Secondary | ICD-10-CM | POA: Insufficient documentation

## 2017-01-11 DIAGNOSIS — I5042 Chronic combined systolic (congestive) and diastolic (congestive) heart failure: Secondary | ICD-10-CM | POA: Diagnosis not present

## 2017-01-11 DIAGNOSIS — N183 Chronic kidney disease, stage 3 (moderate): Secondary | ICD-10-CM | POA: Insufficient documentation

## 2017-01-11 DIAGNOSIS — I480 Paroxysmal atrial fibrillation: Secondary | ICD-10-CM | POA: Insufficient documentation

## 2017-01-11 DIAGNOSIS — Z79899 Other long term (current) drug therapy: Secondary | ICD-10-CM | POA: Diagnosis not present

## 2017-01-11 DIAGNOSIS — Z953 Presence of xenogenic heart valve: Secondary | ICD-10-CM | POA: Insufficient documentation

## 2017-01-11 DIAGNOSIS — Z7982 Long term (current) use of aspirin: Secondary | ICD-10-CM | POA: Diagnosis not present

## 2017-01-11 LAB — CBC
HCT: 39.2 % (ref 39.0–52.0)
HEMOGLOBIN: 12.7 g/dL — AB (ref 13.0–17.0)
MCH: 29.2 pg (ref 26.0–34.0)
MCHC: 32.4 g/dL (ref 30.0–36.0)
MCV: 90.1 fL (ref 78.0–100.0)
Platelets: 170 10*3/uL (ref 150–400)
RBC: 4.35 MIL/uL (ref 4.22–5.81)
RDW: 14.6 % (ref 11.5–15.5)
WBC: 5.9 10*3/uL (ref 4.0–10.5)

## 2017-01-11 LAB — COMPREHENSIVE METABOLIC PANEL
ALBUMIN: 3.9 g/dL (ref 3.5–5.0)
ALT: 32 U/L (ref 17–63)
AST: 30 U/L (ref 15–41)
Alkaline Phosphatase: 95 U/L (ref 38–126)
Anion gap: 9 (ref 5–15)
BILIRUBIN TOTAL: 0.5 mg/dL (ref 0.3–1.2)
BUN: 32 mg/dL — AB (ref 6–20)
CHLORIDE: 100 mmol/L — AB (ref 101–111)
CO2: 29 mmol/L (ref 22–32)
Calcium: 9.6 mg/dL (ref 8.9–10.3)
Creatinine, Ser: 1.78 mg/dL — ABNORMAL HIGH (ref 0.61–1.24)
GFR calc Af Amer: 43 mL/min — ABNORMAL LOW (ref >60)
GFR calc non Af Amer: 37 mL/min — ABNORMAL LOW (ref >60)
GLUCOSE: 121 mg/dL — AB (ref 65–99)
POTASSIUM: 4.3 mmol/L (ref 3.5–5.1)
Sodium: 138 mmol/L (ref 135–145)
TOTAL PROTEIN: 7.2 g/dL (ref 6.5–8.1)

## 2017-01-11 LAB — TSH: TSH: 25.105 u[IU]/mL — ABNORMAL HIGH (ref 0.350–4.500)

## 2017-01-11 LAB — DIGOXIN LEVEL: Digoxin Level: 0.5 ng/mL — ABNORMAL LOW (ref 0.8–2.0)

## 2017-01-11 MED ORDER — CARVEDILOL 6.25 MG PO TABS: 9.3750 mg | ORAL_TABLET | Freq: Two times a day (BID) | ORAL | 6 refills | Status: DC

## 2017-01-11 MED ORDER — AMOXICILLIN 500 MG PO TABS: 2000.0000 mg | ORAL_TABLET | ORAL | 2 refills | Status: DC | PRN

## 2017-01-11 NOTE — Patient Instructions (Signed)
INCREASE Carvedilol (Coreg) to 9.375 mg (1.5 tabs) twice daily.  Routine lab work today. Will notify you of abnormal results, otherwise no news is good news!  Amoxicillin 2 grams (4 tabs) once daily AS NEEDED before dental work.  Follow up 3 months with Dr. Aundra Dubin.  Do the following things EVERYDAY: 1) Weigh yourself in the morning before breakfast. Write it down and keep it in a log. 2) Take your medicines as prescribed 3) Eat low salt foods-Limit salt (sodium) to 2000 mg per day.  4) Stay as active as you can everyday 5) Limit all fluids for the day to less than 2 liters

## 2017-01-12 ENCOUNTER — Encounter: Payer: Self-pay | Admitting: Cardiology

## 2017-01-12 DIAGNOSIS — Z9889 Other specified postprocedural states: Secondary | ICD-10-CM | POA: Diagnosis not present

## 2017-01-12 DIAGNOSIS — I252 Old myocardial infarction: Secondary | ICD-10-CM | POA: Diagnosis not present

## 2017-01-12 DIAGNOSIS — Z951 Presence of aortocoronary bypass graft: Secondary | ICD-10-CM | POA: Diagnosis not present

## 2017-01-12 DIAGNOSIS — Z9861 Coronary angioplasty status: Secondary | ICD-10-CM | POA: Diagnosis not present

## 2017-01-13 NOTE — Progress Notes (Signed)
PCP: Dr Hilma Favors Cardiology: Dr. Sallyanne Kuster HF Cardiology: Dr. Aundra Dubin  71 yo with history of CAD s/p CABG in 2007 then redo CABG in 10/17 with mitral valve replacement presents for cardiology followup.  He was admitted in 10/17 for redo CABG with SVG-PDA and SVG-ramus.  He also had mitral valve replacement with a bioprosthetic valve because of infarct-related mitral regurgitation.  His EF on last study (10/17 TEE) was 20-25%.   Post-operative course was complicated by CHF requiring diuresis.  He also had atrial flutter and required DCCV.  Due to complete heart block, he later got a CRT-D system.    At a prior visit, he was in atrial flutter.  He saw Dr. Curt Bears, it was decided to arrange for DCCV with ablation down the road when PPM leads have been in longer.  He had successful DCCV in 12/17 and is in NSR today.   He was admitted in 3/18 with NSTEMI and chest pain. TnI only 0.5.  LHC showed occlusion of SVG-PDA from CABG#1 to be the likely culprit.  However, SVG-PDA from CABG#2 was patent.  No intervention.  Echo in 3/18 from [place] showed EF 30-35%, stable bioprosthetic mitral valve.   He is doing ok.  He has been doing cardiac rehab at [facility] in [place].  No dyspnea walking on flat ground.  OK walking up a flight of steps.  No problems doing exercises at cardiac rehab.  No lightheadedness/falls.  No further chest pain.  No palpitations.    ECG (personally reviewed): NSR, BiV paced  Optivol (personally reviewed): Fluid index < threshold, impedance stable.  >99% BiV pacing, no atrial fibrillation/flutter, no VT.   Labs (11/17): K 4.8, creatinine 1.89, hgb 12.3, digoxin 0.9, LFTs normal, TSH 8.235 (mild increase), free T3 low, free T4 normal, LFTs normal.  Labs (12/17): K 4.5, creatinine 1.7, BNP 3909 Labs (3/18): K 3.9, creatinine 1.48, hgb 11.3  PMH: 1. CAD: CABG 2007.  - LHC (9/17) with patent LIMA-LAD, totally occluded SVG-D, severe stenosis in SVG-PDA.  - Redo CABG 10/17 with SVG-PDA,  SVG-ramus and mitral valve replacement.  - NSTEMI 3/18.  LHC with culprit lesion likely occlusion of SVG-PDA from CABG#1.  Patent SVG-PDA from CABG#2.  No intervention.  2. Mitral regurgitation: Ischemic MR, mitral valve replacement was done in 10/17 (bioprosthetic). 3. Complete heart block: Post-op in 10/17.  Medtronic CRT-D placed.  4. Atrial flutter: DCCV 10/17 and again in 12/17.  5. Type II diabetes 6. Hyperlipidemia 7. Chronic systolic CHF: Ischemic cardiomyopathy.   - TEE (10/17): EF 20-25%, severe LV dilation - Echo (3/18, [place]): EF 30-35%, bioprosthetic mitral valve with mean gradient 5 mmHg.   SH: Married, retired, lives in [place], nonsmoker, no ETOH.   Family History  Problem Relation Age of Onset  . Heart attack Mother   . Heart disease Mother   . Heart attack Father   . Heart disease Father   . Hypertension Father   . Hypertension Sister   . Heart disease Maternal Grandmother    ROS: All systems reviewed and negative except as per HPI.   Current Outpatient Prescriptions  Medication Sig Dispense Refill  . ACCU-CHEK SOFTCLIX LANCETS lancets Use as instructed. E11.65 100 each 2  . amiodarone (PACERONE) 200 MG tablet Take 1 tablet (200 mg total) by mouth at bedtime. 30 tablet 3  . aspirin EC 81 MG tablet Take 81 mg by mouth daily.     . carvedilol (COREG) 6.25 MG tablet Take 1.5 tablets (9.375 mg total) by  mouth 2 (two) times daily. 90 tablet 6  . digoxin (LANOXIN) 0.125 MG tablet Take 0.5 tablets (0.0625 mg total) by mouth daily. 30 tablet 1  . ENTRESTO 49-51 MG TAKE ONE TABLET BY MOUTH TWICE DAILY 180 tablet 3  . furosemide (LASIX) 20 MG tablet Take 1 tablet (20 mg total) by mouth every other day. 30 tablet 11  . metFORMIN (GLUCOPHAGE) 500 MG tablet Take 1 tablet (500 mg total) by mouth 2 (two) times daily with a meal. 200 tablet 1  . nitroGLYCERIN (NITROSTAT) 0.4 MG SL tablet Place 1 tablet (0.4 mg total) under the tongue every 5 (five) minutes x 3 doses as needed  for chest pain. 30 tablet 12  . rosuvastatin (CRESTOR) 10 MG tablet TAKE ONE TABLET BY MOUTH ONCE DAILY 90 tablet 0  . spironolactone (ALDACTONE) 25 MG tablet Take 0.5 tablets (12.5 mg total) by mouth daily. 30 tablet 6  . warfarin (COUMADIN) 5 MG tablet Take 10 mg 3/20 3/21 then take 5 mg daily Sun, Tues, Wed, Fri, and Sat. Take 7.5 mg on all other days. 45 tablet 3  . amoxicillin (AMOXIL) 500 MG tablet Take 4 tablets (2,000 mg total) by mouth as needed. Pre-dental work only. 4 tablet 2   No current facility-administered medications for this encounter.    BP (!) 142/88 (BP Location: Left Arm, Patient Position: Sitting, Cuff Size: Normal)   Pulse 68   Wt 171 lb (77.6 kg)   SpO2 100%   BMI 23.19 kg/m  General: NAD Neck: JVP 7 cm, no thyromegaly or thyroid nodule. Right neck large lipoma.  Lungs: Clear to auscultation bilaterally with normal respiratory effort. CV: Nonpalpable PMI.  Heart regular S1/S2, no S3/S4, 1/6 SEM RUSB.  No edema.  No carotid bruit.  Normal pedal pulses.  Abdomen: Soft, nontender, no HSM, no distention.  Skin: Intact without lesions or rashes.  Neurologic: Alert and oriented x 3.  Psych: Normal affect. Extremities: No clubbing or cyanosis.  HEENT: Normal.   Assessment/Plan: 1. Chronic systolic CHF: Ischemic cardiomyopathy.  TEE 10/17 with EF 20-25%.  Has Medtronic CRT-D system. Most recent echo from [facility] in 3/18 with EF 30-35%.  On exam today, he is not volume overloaded, NYHA class II symptoms.  Optivol also does not suggest volume overload.   - Continue Lasix 20 mg every other day, BMET today.   - Continue Entresto 49/51 bid.   - Continue current spironolactone and digoxin.  Check digoxin level.  - Increase Coreg to 9.375 mg bid.  2. CAD: s/p redo CABG.  Recent admission with NSTEMI, LHC showed occlusion of SVG-PDA from CABG#1 but patent SVG-PDA from CABG#2, no intervention.  No further chest pain.  - Continue statin => Crestor 10 mg daily.  -  Continue cardiac rehab.   3. Bioprosthetic mitral valve: Stable on 3/18 echo from [place]. 4. Atrial flutter: Paroxysmal, he is in NSR today.  He had initial post-op atrial flutter then had a recurrence after he got home.    - Continue amiodarone until he has had atrial flutter ablation.  Needs regular eye exam.  Check LFTs, TSH today.   - Saw Dr. Curt Bears with plan for atrial flutter ablation.  He does not want to do this yet (does not want to go back in hospital).  I encouraged him to have this done at some point soon so we can get him off amiodarone (has had recurrence of atrial flutter already). - Continue warfarin.  5. CKD: Stage III.  BMET today.   Followup in 3 months.    Loralie Champagne 01/13/2017

## 2017-01-15 ENCOUNTER — Ambulatory Visit (INDEPENDENT_AMBULATORY_CARE_PROVIDER_SITE_OTHER): Payer: Medicare Other | Admitting: *Deleted

## 2017-01-15 ENCOUNTER — Telehealth (HOSPITAL_COMMUNITY): Payer: Self-pay | Admitting: *Deleted

## 2017-01-15 DIAGNOSIS — I25810 Atherosclerosis of coronary artery bypass graft(s) without angina pectoris: Secondary | ICD-10-CM

## 2017-01-15 DIAGNOSIS — Z953 Presence of xenogenic heart valve: Secondary | ICD-10-CM

## 2017-01-15 DIAGNOSIS — I252 Old myocardial infarction: Secondary | ICD-10-CM | POA: Diagnosis not present

## 2017-01-15 DIAGNOSIS — I483 Typical atrial flutter: Secondary | ICD-10-CM

## 2017-01-15 DIAGNOSIS — Z951 Presence of aortocoronary bypass graft: Secondary | ICD-10-CM | POA: Diagnosis not present

## 2017-01-15 DIAGNOSIS — Z5181 Encounter for therapeutic drug level monitoring: Secondary | ICD-10-CM | POA: Diagnosis not present

## 2017-01-15 DIAGNOSIS — Z9861 Coronary angioplasty status: Secondary | ICD-10-CM | POA: Diagnosis not present

## 2017-01-15 DIAGNOSIS — Z9889 Other specified postprocedural states: Secondary | ICD-10-CM | POA: Diagnosis not present

## 2017-01-15 LAB — POCT INR: INR: 2.5

## 2017-01-15 MED ORDER — LEVOTHYROXINE SODIUM 25 MCG PO TABS: 25.0000 ug | ORAL_TABLET | Freq: Every day | ORAL | 3 refills | Status: DC

## 2017-01-15 NOTE — Telephone Encounter (Signed)
Patient's wife called and left message on triage line asking how he needed to take his amoxicillin prior to his dental appointment tomorrow morning.   I tried calling patient back but had to leave VM asking for them to call me back.

## 2017-01-15 NOTE — Telephone Encounter (Signed)
Notes recorded by Harvie Junior, CMA on 01/15/2017 at 4:07 PM EDT pts wife aware of lab results. Medication sent to [facility] in [place]. Pts wife said he is looking for a new PCP. Will contact us once established with new PCP ------  Notes recorded by Larey Dresser, MD on 01/14/2017 at 4:31 PM EDT TSH high. We noticed hypothyoridism in the past and sent to PCP to manage but he has not started medication. Start Levoxyl 25 mcg daily and send labs to PCP again, would like him to manage hypothyroidism from now on.    Ref Range & Units 4d ago 72mo ago 16mo ago   TSH 0.350 - 4.500 uIU/mL 25.105   10.485CM   8.235CM

## 2017-01-15 NOTE — Telephone Encounter (Signed)
Patient's wife called back regarding amoxicillin.  I told her that patient should take all 4 amoxicillin (2,000 mg) tablets 1 hour prior to dental procedure.   She understands and no further questions at this time.

## 2017-01-17 DIAGNOSIS — Z951 Presence of aortocoronary bypass graft: Secondary | ICD-10-CM | POA: Diagnosis not present

## 2017-01-17 DIAGNOSIS — I252 Old myocardial infarction: Secondary | ICD-10-CM | POA: Diagnosis not present

## 2017-01-17 DIAGNOSIS — Z9889 Other specified postprocedural states: Secondary | ICD-10-CM | POA: Diagnosis not present

## 2017-01-17 DIAGNOSIS — Z9861 Coronary angioplasty status: Secondary | ICD-10-CM | POA: Diagnosis not present

## 2017-01-19 DIAGNOSIS — Z9889 Other specified postprocedural states: Secondary | ICD-10-CM | POA: Diagnosis not present

## 2017-01-19 DIAGNOSIS — Z951 Presence of aortocoronary bypass graft: Secondary | ICD-10-CM | POA: Diagnosis not present

## 2017-01-19 DIAGNOSIS — I252 Old myocardial infarction: Secondary | ICD-10-CM | POA: Diagnosis not present

## 2017-01-19 DIAGNOSIS — Z9861 Coronary angioplasty status: Secondary | ICD-10-CM | POA: Diagnosis not present

## 2017-01-22 DIAGNOSIS — Z9861 Coronary angioplasty status: Secondary | ICD-10-CM | POA: Diagnosis not present

## 2017-01-22 DIAGNOSIS — I252 Old myocardial infarction: Secondary | ICD-10-CM | POA: Diagnosis not present

## 2017-01-22 DIAGNOSIS — Z9889 Other specified postprocedural states: Secondary | ICD-10-CM | POA: Diagnosis not present

## 2017-01-22 DIAGNOSIS — Z951 Presence of aortocoronary bypass graft: Secondary | ICD-10-CM | POA: Diagnosis not present

## 2017-01-23 ENCOUNTER — Encounter: Payer: Self-pay | Admitting: Cardiovascular Disease

## 2017-01-23 ENCOUNTER — Ambulatory Visit (INDEPENDENT_AMBULATORY_CARE_PROVIDER_SITE_OTHER): Payer: Medicare Other | Admitting: Cardiovascular Disease

## 2017-01-23 VITALS — BP 144/80 | HR 70 | Ht 72.0 in | Wt 174.8 lb

## 2017-01-23 DIAGNOSIS — Z5181 Encounter for therapeutic drug level monitoring: Secondary | ICD-10-CM | POA: Diagnosis not present

## 2017-01-23 DIAGNOSIS — I25810 Atherosclerosis of coronary artery bypass graft(s) without angina pectoris: Secondary | ICD-10-CM

## 2017-01-23 DIAGNOSIS — E032 Hypothyroidism due to medicaments and other exogenous substances: Secondary | ICD-10-CM

## 2017-01-23 DIAGNOSIS — I10 Essential (primary) hypertension: Secondary | ICD-10-CM | POA: Diagnosis not present

## 2017-01-23 DIAGNOSIS — Z953 Presence of xenogenic heart valve: Secondary | ICD-10-CM | POA: Diagnosis not present

## 2017-01-23 DIAGNOSIS — I483 Typical atrial flutter: Secondary | ICD-10-CM | POA: Diagnosis not present

## 2017-01-23 DIAGNOSIS — E1159 Type 2 diabetes mellitus with other circulatory complications: Secondary | ICD-10-CM | POA: Diagnosis not present

## 2017-01-23 DIAGNOSIS — I701 Atherosclerosis of renal artery: Secondary | ICD-10-CM

## 2017-01-23 DIAGNOSIS — I5042 Chronic combined systolic (congestive) and diastolic (congestive) heart failure: Secondary | ICD-10-CM | POA: Diagnosis not present

## 2017-01-23 DIAGNOSIS — Z95810 Presence of automatic (implantable) cardiac defibrillator: Secondary | ICD-10-CM

## 2017-01-23 DIAGNOSIS — E782 Mixed hyperlipidemia: Secondary | ICD-10-CM

## 2017-01-23 NOTE — Progress Notes (Signed)
Cardiology Office Note    Date:  01/24/2017   ID:  Christian Bowman, DOB 1946/07/13, MRN [id number]  PCP:  No PCP Per Patient  Cardiologist:  Loralie Champagne, MD; Allegra Lai, MD; Sanda Klein, MD   Chief Complaint  Patient presents with  . Follow-up    History of Present Illness:  Christian Bowman is a 71 y.o. male with extensive and severe cardiac problems.  He underwent redo coronary artery bypass 2 (SVG to PDA, SVG to ramus), as well as mitral valve replacement (bioprosthetic Edwards magna 31 mm) on 87/86/7672, complicated by AV block and atrial flutter requiring cardioversion. Estimated EF 20-25 % on his 07/18/16 TEE. A biventricular defibrillator (Medtronic Claria MRI CRT-D) was implanted on October 24. He has had a couple of episodes of symptomatic atrial flutter and  underwent cardioversion on December 8 and has not had recurrence of atrial arrhythmia since that time, while on antiarrhythmic therapy with amiodarone. On December 18, 2016 he was hospitalized for small non-STEMI and was found to have interval occlusion of the old SVG to PDA, while the more recently placed SVG was still patent. LVEDP was quite low at only 3 mmHg during that catheterization.  Interrogation of his device today shows he has had a handful of episodes of paroxysmal atrial tachycardia, the longest lasting for only 49 seconds. Activity level remains poor at only 0.7 hours a day. He has 34% atrial pacing and 97% biventricular pacing. He has not had ventricular tachycardia.  He has developed increasing TSH level since starting amiodarone and in parallel, there is evidence of increasing frequency of atrial pacing.  He is therapeutically anticoagulated and has not had any bleeding complications or neurological events.  His home scale typically shows a weight of 166-67 pounds. He has not had problems with orthopnea or PND. On one occasion he had a dizzy spell and had to lay down. His systolic blood pressure was only 78  mmHg.  He understands the need for endocarditis prophylaxis with dental procedures or other medical procedures that might cause bacteremia and the indication for anticoagulation chronically.   Past Medical History:  Diagnosis Date  . Anginal pain ([place])   . CAD (coronary artery disease)   . Coronary artery disease involving coronary bypass graft   . Cyst of neck    right side  . DM2 (diabetes mellitus, type 2) ([place]) 08/26/2013  . Dyspnea   . Heart attack ([place])   . HTN (hypertension) 08/26/2013  . Hyperlipidemia 08/26/2013  . Left main coronary artery disease   . Left renal artery stenosis ([place])    Genesis 6x12 stent 2007  . Obesity (BMI 30.0-34.9) 08/26/2013  . Postoperative atrial fibrillation ([place]) 10/15/2005  . S/P CABG x 4 10/13/2005   LIMA to LAD, SVG to intermediate branch, sequential SVG to PDA and RPL branch, EVH via right thigh  . S/P mitral valve replacement with bioprosthetic valve 07/11/2016   31 mm [facility] bovine bioprosthetic tissue valve  . S/P redo CABG x 2 07/11/2016   SVG to PDA and SVG to Intermediate Branch, EVH via left thigh    Past Surgical History:  Procedure Laterality Date  . CARDIAC CATHETERIZATION N/A 06/21/2016   Procedure: Right/Left Heart Cath and Coronary/Graft Angiography;  Surgeon: Sherren Mocha, MD;  Location: [place] [facility];  Service: Cardiovascular;  Laterality: N/A;  . CARDIOVERSION N/A 07/19/2016   Procedure: CARDIOVERSION;  Surgeon: Lelon Perla, MD;  Location: [place];  Service: Cardiovascular;  Laterality: N/A;  . CARDIOVERSION N/A 09/08/2016   Procedure: CARDIOVERSION;  Surgeon: Fay Records, MD;  Location: [facility] END[facility];  Service: Cardiovascular;  Laterality: N/A;  . CORONARY ARTERY BYPASS GRAFT  10/13/2005   LIMA to LAD, SVG to intermediate branch, sequential SVG to PDA and RPL  . CORONARY ARTERY BYPASS GRAFT N/A 07/11/2016   Procedure: REDO CORONARY ARTERY BYPASS GRAFTING (CABG) x two using left leg greater  saphenous vein harvested endoscopically-SVG to PDA -SVG to RAMUS INTERMEDIATE;  Surgeon: Rexene Alberts, MD;  Location: [place];  Service: Open Heart Surgery;  Laterality: N/A;  . CORONARY ARTERY BYPASS GRAFT N/A 07/11/2016   Procedure: Re-exploration (CABG) for post op bleeding,;  Surgeon: Rexene Alberts, MD;  Location: [place];  Service: Open Heart Surgery;  Laterality: N/A;  . EP IMPLANTABLE DEVICE N/A 07/25/2016   Procedure: BiV ICD Insertion CRT-D;  Surgeon: Will Meredith Leeds, MD;  Location: [place] [facility];  Service: Cardiovascular;  Laterality: N/A;  . LEFT HEART CATH AND CORS/GRAFTS ANGIOGRAPHY N/A 12/18/2016   Procedure: Left Heart Cath and Cors/Grafts Angiography;  Surgeon: Sherren Mocha, MD;  Location: [place] [facility];  Service: Cardiovascular;  Laterality: N/A;  . MITRAL VALVE REPLACEMENT N/A 07/11/2016   Procedure: MITRAL VALVE (MV) REPLACEMENT;  Surgeon: Rexene Alberts, MD;  Location: [place];  Service: Open Heart Surgery;  Laterality: N/A;  . MYOCARDICAL PERFUSION  10/09/2007   NORMAL PERFUSION IN ALL REGIONS;NO EVIDENCE OF INDUCIBLE ISCHEMIA;POST STRESS EF% 66  . PERCUTANEOUS CORONARY STENT INTERVENTION (PCI-S)     DES in SVG to right coronary artery system  . RENAL ARTERY STENT Right 2007  . RENAL DOPPLER  03/28/2010   RIGHT RA-NORMAL;LEFT PROXIMAL RA AT STENT-PATENT WITH NO EVIDENCE OF SIGN DIAMETER REDUCTION. R & L KIDNEYS: EQUAL IN SIZE,SYMMETRICAL IN SHAPE.  . TEE WITHOUT CARDIOVERSION N/A 06/15/2016   Procedure: TRANSESOPHAGEAL ECHOCARDIOGRAM (TEE);  Surgeon: Sanda Klein, MD;  Location: [facility] ENDOSCOPY;  Service: Cardiovascular;  Laterality: N/A;  . TEE WITHOUT CARDIOVERSION N/A 07/11/2016   Procedure: TRANSESOPHAGEAL ECHOCARDIOGRAM (TEE);  Surgeon: Rexene Alberts, MD;  Location: [place];  Service: Open Heart Surgery;  Laterality: N/A;  . TEE WITHOUT CARDIOVERSION N/A 07/19/2016   Procedure: TRANSESOPHAGEAL ECHOCARDIOGRAM (TEE);  Surgeon: Lelon Perla, MD;  Location: [facility]  ENDOSCOPY;  Service: Cardiovascular;  Laterality: N/A;  . TRANSESOPHAGEAL ECHOCARDIOGRAM  10/19/2005   NORMAL LV; MILD TO MODERATE AMOUNT OF SOFT ATHEROMATOUS PLAQUE OF THE THORACIC AORTA; THE LEFT ATRIUM IS MILDLY DILATED;LEFT ATRIAL APPENDAGE FUNCTION IS NORMAL;NO THROMBUS IDENTIFIED. SMALL PFO WITH RIGHT TO LEFT SHUNT    Current Medications: Outpatient Medications Prior to Visit  Medication Sig Dispense Refill  . ACCU-CHEK SOFTCLIX LANCETS lancets Use as instructed. E11.65 100 each 2  . amiodarone (PACERONE) 200 MG tablet Take 1 tablet (200 mg total) by mouth at bedtime. 30 tablet 3  . amoxicillin (AMOXIL) 500 MG tablet Take 4 tablets (2,000 mg total) by mouth as needed. Pre-dental work only. 4 tablet 2  . aspirin EC 81 MG tablet Take 81 mg by mouth daily.     . carvedilol (COREG) 6.25 MG tablet Take 1.5 tablets (9.375 mg total) by mouth 2 (two) times daily. 90 tablet 6  . digoxin (LANOXIN) 0.125 MG tablet Take 0.5 tablets (0.0625 mg total) by mouth daily. 30 tablet 1  . ENTRESTO 49-51 MG TAKE ONE TABLET BY MOUTH TWICE DAILY 180 tablet 3  . furosemide (LASIX) 20 MG tablet Take 1 tablet (20 mg total)  by mouth every other day. 30 tablet 11  . levothyroxine (LEVOXYL) 25 MCG tablet Take 1 tablet (25 mcg total) by mouth daily before breakfast. 90 tablet 3  . metFORMIN (GLUCOPHAGE) 500 MG tablet Take 1 tablet (500 mg total) by mouth 2 (two) times daily with a meal. 200 tablet 1  . nitroGLYCERIN (NITROSTAT) 0.4 MG SL tablet Place 1 tablet (0.4 mg total) under the tongue every 5 (five) minutes x 3 doses as needed for chest pain. 30 tablet 12  . rosuvastatin (CRESTOR) 10 MG tablet TAKE ONE TABLET BY MOUTH ONCE DAILY 90 tablet 0  . spironolactone (ALDACTONE) 25 MG tablet Take 0.5 tablets (12.5 mg total) by mouth daily. 30 tablet 6  . warfarin (COUMADIN) 5 MG tablet Take 10 mg 3/20 3/21 then take 5 mg daily Sun, Tues, Wed, Fri, and Sat. Take 7.5 mg on all other days. 45 tablet 3   No  facility-administered medications prior to visit.      Allergies:   Xanax [alprazolam]   Social History   Social History  . Marital status: Married    Spouse name: N/A  . Number of children: N/A  . Years of education: N/A   Social History Main Topics  . Smoking status: Never Smoker  . Smokeless tobacco: Never Used  . Alcohol use No  . Drug use: No  . Sexual activity: Not Currently   Other Topics Concern  . None   Social History Narrative  . None     Family History:  The patient's family history includes Heart attack in his father and mother; Heart disease in his father, maternal grandmother, and mother; Hypertension in his father and sister.   ROS:   Please see the history of present illness.    ROS All other systems reviewed and are negative.   PHYSICAL EXAM:   VS:  BP (!) 144/80 (BP Location: Right Arm, Patient Position: Sitting, Cuff Size: Normal)   Pulse 70   Ht 6' (1.829 m)   Wt 79.3 kg (174 lb 12.8 oz)   BMI 23.71 kg/m    GEN: Well nourished, well developed, in no acute distress  HEENT: normal  Neck: no JVD, carotid bruits, or masses Cardiac: RRR; no murmurs, rubs, or gallops,no edema  Respiratory:  clear to auscultation bilaterally, normal work of breathing GI: soft, nontender, nondistended, + BS MS: no deformity or atrophy  Skin: warm and dry, no rash Neuro:  Alert and Oriented x 3, Strength and sensation are intact Psych: euthymic mood, full affect  Wt Readings from Last 3 Encounters:  01/23/17 79.3 kg (174 lb 12.8 oz)  01/11/17 77.6 kg (171 lb)  12/22/16 81.2 kg (179 lb)      Studies/Labs Reviewed:   EKG:  EKG is not ordered today.  The intracardiac electrogram ordered today demonstrates atrial sensed, biventricular paced rhythm   Recent Labs: 07/17/2016: Magnesium 2.1 12/15/2016: B Natriuretic Peptide >4,500.0 01/11/2017: ALT 32; BUN 32; Creatinine, Ser 1.78; Hemoglobin 12.7; Platelets 170; Potassium 4.3; Sodium 138; TSH 25.105   Lipid  Panel    Component Value Date/Time   CHOL 95 (L) 04/20/2016 0805   TRIG 99 07/17/2016 0335   HDL 28 (L) 04/20/2016 0805   CHOLHDL 3.4 04/20/2016 0805   LDLCALC 45 04/20/2016 0805    Additional studies/ records that were reviewed today include:  Notes from Dr. Roxy Manns, Dr. Curt Bears, heart failure clinic  ASSESSMENT:    1. Chronic combined systolic and diastolic heart failure ([place])   2.  Coronary artery disease involving coronary bypass graft of native heart without angina pectoris   3. Typical atrial flutter ([place])   4. S/P mitral valve replacement with bioprosthetic valve + redo CABG x2   5. Presence of biventricular implantable cardioverter-defibrillator (ICD)   6. Essential hypertension   7. Mixed hyperlipidemia   8. Type 2 diabetes mellitus with vascular disease ([place])   9. Left renal artery stenosis ([facility])   10. Encounter for therapeutic drug monitoring   11. Hypothyroidism due to medication      PLAN:  In order of problems listed above:  1. CHF: NYHA class 2, appears clinically euvolemic and with episodes of hypotension that may signify hypovolemia. Continue carvedilol, Entresto and spironolactone. I think it still premature to consider increasing his carvedilol dose. Encourage more physical activity. 2. CAD: He does not have angina pectoris. Recently lost one of his old SVG to the RCA, but still has a new patent SVG to PDA as well as patent LIMA to LAD.  3. AFlutter: With a bioprosthetic mitral valve, CHADSVasc score no longer relevant. Successfully maintaining rhythm on amiodarone since his cardioversion in December. Unless he undergoes successful ablation, plan lifelong anticoagulation. Even if ablation is successful, need to consider high risk for future atrial fibrillation. For the time being, continue amiodarone and warfarin 4. S/P MVR: On anticoagulation for now. If the issue of atrial flutter was resolved, if there is no incident atrial fibrillation, consider discontinuation  of warfarin in 3-6 months 5. CRT-D: Excellent device function with 100% biventricular pacing since his last cardioversion. 6. Hx HTN, now Hypotension due to meds: Symptomatic events are fewer after we decreased his dose of diuretic. 7. HLP: On statin. Repeat lipid profile. 8. DM: Now on metformin monotherapy with substantial weight loss over the last few months weight is in target range. Glycemic control has been excellent. 9. History of left renal artery stenosis status post stent 2007, patent by duplex ultrasound September 2016 10. Hypothyroidism: Gradually adjust thyroid supplements based on TSH and free T4 levels. Suspect he will eventually require a substantially higher dose of supplement. 11. LFTs normal on very recent lab tests    Medication Adjustments/Labs and Tests Ordered: Current medicines are reviewed at length with the patient today.  Concerns regarding medicines are outlined above.  Medication changes, Labs and Tests ordered today are listed in the Patient Instructions below. Patient Instructions  Dr Sallyanne Kuster recommends that you schedule a follow-up appointment in 2 months.  If you need a refill on your cardiac medications before your next appointment, please call your pharmacy.    Signed, Sanda Klein, MD  01/24/2017 4:20 PM    [facility] [address], [place], [place]  [address] Phone: [phone]; Fax: [phone]

## 2017-01-23 NOTE — Patient Instructions (Signed)
Dr Croitoru recommends that you schedule a follow-up appointment in 2 months.  If you need a refill on your cardiac medications before your next appointment, please call your pharmacy. 

## 2017-01-24 DIAGNOSIS — I252 Old myocardial infarction: Secondary | ICD-10-CM | POA: Diagnosis not present

## 2017-01-24 DIAGNOSIS — Z951 Presence of aortocoronary bypass graft: Secondary | ICD-10-CM | POA: Diagnosis not present

## 2017-01-24 DIAGNOSIS — Z9861 Coronary angioplasty status: Secondary | ICD-10-CM | POA: Diagnosis not present

## 2017-01-24 DIAGNOSIS — Z9889 Other specified postprocedural states: Secondary | ICD-10-CM | POA: Diagnosis not present

## 2017-01-25 ENCOUNTER — Other Ambulatory Visit: Payer: Self-pay

## 2017-01-26 ENCOUNTER — Ambulatory Visit (INDEPENDENT_AMBULATORY_CARE_PROVIDER_SITE_OTHER): Payer: Medicare Other | Admitting: *Deleted

## 2017-01-26 DIAGNOSIS — I252 Old myocardial infarction: Secondary | ICD-10-CM | POA: Diagnosis not present

## 2017-01-26 DIAGNOSIS — I5042 Chronic combined systolic (congestive) and diastolic (congestive) heart failure: Secondary | ICD-10-CM

## 2017-01-26 DIAGNOSIS — Z9861 Coronary angioplasty status: Secondary | ICD-10-CM | POA: Diagnosis not present

## 2017-01-26 DIAGNOSIS — Z9889 Other specified postprocedural states: Secondary | ICD-10-CM | POA: Diagnosis not present

## 2017-01-26 DIAGNOSIS — Z951 Presence of aortocoronary bypass graft: Secondary | ICD-10-CM | POA: Diagnosis not present

## 2017-01-26 NOTE — Progress Notes (Signed)
Remote ICD transmission.   

## 2017-01-29 DIAGNOSIS — Z951 Presence of aortocoronary bypass graft: Secondary | ICD-10-CM | POA: Diagnosis not present

## 2017-01-29 DIAGNOSIS — I252 Old myocardial infarction: Secondary | ICD-10-CM | POA: Diagnosis not present

## 2017-01-29 DIAGNOSIS — Z9889 Other specified postprocedural states: Secondary | ICD-10-CM | POA: Diagnosis not present

## 2017-01-29 DIAGNOSIS — Z9861 Coronary angioplasty status: Secondary | ICD-10-CM | POA: Diagnosis not present

## 2017-01-30 ENCOUNTER — Encounter: Payer: Self-pay | Admitting: Cardiology

## 2017-01-30 DIAGNOSIS — I252 Old myocardial infarction: Secondary | ICD-10-CM | POA: Diagnosis not present

## 2017-01-30 DIAGNOSIS — Z9861 Coronary angioplasty status: Secondary | ICD-10-CM | POA: Diagnosis not present

## 2017-01-30 DIAGNOSIS — Z951 Presence of aortocoronary bypass graft: Secondary | ICD-10-CM | POA: Diagnosis not present

## 2017-01-30 DIAGNOSIS — Z9889 Other specified postprocedural states: Secondary | ICD-10-CM | POA: Diagnosis not present

## 2017-01-30 LAB — CUP PACEART REMOTE DEVICE CHECK
Brady Statistic AP VS Percent: 1.24 %
Brady Statistic AS VP Percent: 67.91 %
Brady Statistic AS VS Percent: 1.38 %
Brady Statistic RA Percent Paced: 30.64 %
Brady Statistic RV Percent Paced: 27.31 %
Date Time Interrogation Session: 20180427163532
HighPow Impedance: 51 Ohm
Implantable Lead Implant Date: 20171024
Implantable Lead Location: 753859
Implantable Lead Location: 753860
Implantable Lead Model: 5076
Lead Channel Impedance Value: 171 Ohm
Lead Channel Impedance Value: 171 Ohm
Lead Channel Impedance Value: 171 Ohm
Lead Channel Impedance Value: 171 Ohm
Lead Channel Impedance Value: 361 Ohm
Lead Channel Impedance Value: 456 Ohm
Lead Channel Impedance Value: 513 Ohm
Lead Channel Impedance Value: 551 Ohm
Lead Channel Impedance Value: 589 Ohm
Lead Channel Impedance Value: 589 Ohm
Lead Channel Pacing Threshold Amplitude: 1 V
Lead Channel Pacing Threshold Pulse Width: 1 ms
Lead Channel Sensing Intrinsic Amplitude: 1.875 mV
Lead Channel Setting Pacing Amplitude: 2 V
Lead Channel Setting Pacing Amplitude: 2.25 V
Lead Channel Setting Pacing Pulse Width: 0.4 ms
Lead Channel Setting Sensing Sensitivity: 0.3 mV
MDC IDC LEAD IMPLANT DT: 20171024
MDC IDC LEAD IMPLANT DT: 20171024
MDC IDC LEAD LOCATION: 753858
MDC IDC MSMT BATTERY REMAINING LONGEVITY: 87 mo
MDC IDC MSMT BATTERY VOLTAGE: 3.01 V
MDC IDC MSMT LEADCHNL LV IMPEDANCE VALUE: 171 Ohm
MDC IDC MSMT LEADCHNL LV IMPEDANCE VALUE: 342 Ohm
MDC IDC MSMT LEADCHNL LV IMPEDANCE VALUE: 342 Ohm
MDC IDC MSMT LEADCHNL LV IMPEDANCE VALUE: 342 Ohm
MDC IDC MSMT LEADCHNL LV IMPEDANCE VALUE: 342 Ohm
MDC IDC MSMT LEADCHNL LV IMPEDANCE VALUE: 589 Ohm
MDC IDC MSMT LEADCHNL LV IMPEDANCE VALUE: 589 Ohm
MDC IDC MSMT LEADCHNL LV PACING THRESHOLD AMPLITUDE: 1.125 V
MDC IDC MSMT LEADCHNL RA IMPEDANCE VALUE: 418 Ohm
MDC IDC MSMT LEADCHNL RA PACING THRESHOLD PULSEWIDTH: 0.4 ms
MDC IDC MSMT LEADCHNL RA SENSING INTR AMPL: 1.875 mV
MDC IDC MSMT LEADCHNL RV PACING THRESHOLD AMPLITUDE: 0.625 V
MDC IDC MSMT LEADCHNL RV PACING THRESHOLD PULSEWIDTH: 0.4 ms
MDC IDC MSMT LEADCHNL RV SENSING INTR AMPL: 4.25 mV
MDC IDC MSMT LEADCHNL RV SENSING INTR AMPL: 4.25 mV
MDC IDC PG IMPLANT DT: 20171024
MDC IDC SET LEADCHNL LV PACING PULSEWIDTH: 1 ms
MDC IDC SET LEADCHNL RA PACING AMPLITUDE: 2 V
MDC IDC STAT BRADY AP VP PERCENT: 29.48 %

## 2017-01-31 DIAGNOSIS — Z9861 Coronary angioplasty status: Secondary | ICD-10-CM | POA: Diagnosis not present

## 2017-01-31 DIAGNOSIS — Z9889 Other specified postprocedural states: Secondary | ICD-10-CM | POA: Diagnosis not present

## 2017-01-31 DIAGNOSIS — I252 Old myocardial infarction: Secondary | ICD-10-CM | POA: Diagnosis not present

## 2017-01-31 DIAGNOSIS — Z951 Presence of aortocoronary bypass graft: Secondary | ICD-10-CM | POA: Diagnosis not present

## 2017-02-02 DIAGNOSIS — Z951 Presence of aortocoronary bypass graft: Secondary | ICD-10-CM | POA: Diagnosis not present

## 2017-02-02 DIAGNOSIS — I252 Old myocardial infarction: Secondary | ICD-10-CM | POA: Diagnosis not present

## 2017-02-02 DIAGNOSIS — Z9861 Coronary angioplasty status: Secondary | ICD-10-CM | POA: Diagnosis not present

## 2017-02-02 DIAGNOSIS — Z9889 Other specified postprocedural states: Secondary | ICD-10-CM | POA: Diagnosis not present

## 2017-02-05 DIAGNOSIS — Z951 Presence of aortocoronary bypass graft: Secondary | ICD-10-CM | POA: Diagnosis not present

## 2017-02-05 DIAGNOSIS — I252 Old myocardial infarction: Secondary | ICD-10-CM | POA: Diagnosis not present

## 2017-02-05 DIAGNOSIS — Z9889 Other specified postprocedural states: Secondary | ICD-10-CM | POA: Diagnosis not present

## 2017-02-05 DIAGNOSIS — Z9861 Coronary angioplasty status: Secondary | ICD-10-CM | POA: Diagnosis not present

## 2017-02-07 DIAGNOSIS — Z951 Presence of aortocoronary bypass graft: Secondary | ICD-10-CM | POA: Diagnosis not present

## 2017-02-07 DIAGNOSIS — I252 Old myocardial infarction: Secondary | ICD-10-CM | POA: Diagnosis not present

## 2017-02-07 DIAGNOSIS — Z9889 Other specified postprocedural states: Secondary | ICD-10-CM | POA: Diagnosis not present

## 2017-02-07 DIAGNOSIS — Z9861 Coronary angioplasty status: Secondary | ICD-10-CM | POA: Diagnosis not present

## 2017-02-12 ENCOUNTER — Ambulatory Visit (INDEPENDENT_AMBULATORY_CARE_PROVIDER_SITE_OTHER): Payer: Medicare Other | Admitting: *Deleted

## 2017-02-12 DIAGNOSIS — I701 Atherosclerosis of renal artery: Secondary | ICD-10-CM | POA: Diagnosis not present

## 2017-02-12 DIAGNOSIS — Z951 Presence of aortocoronary bypass graft: Secondary | ICD-10-CM | POA: Diagnosis not present

## 2017-02-12 DIAGNOSIS — Z9861 Coronary angioplasty status: Secondary | ICD-10-CM | POA: Diagnosis not present

## 2017-02-12 DIAGNOSIS — Z953 Presence of xenogenic heart valve: Secondary | ICD-10-CM | POA: Diagnosis not present

## 2017-02-12 DIAGNOSIS — I252 Old myocardial infarction: Secondary | ICD-10-CM | POA: Diagnosis not present

## 2017-02-12 DIAGNOSIS — I483 Typical atrial flutter: Secondary | ICD-10-CM | POA: Diagnosis not present

## 2017-02-12 DIAGNOSIS — Z5181 Encounter for therapeutic drug level monitoring: Secondary | ICD-10-CM | POA: Diagnosis not present

## 2017-02-12 DIAGNOSIS — Z9889 Other specified postprocedural states: Secondary | ICD-10-CM | POA: Diagnosis not present

## 2017-02-12 LAB — POCT INR: INR: 2.8

## 2017-02-14 DIAGNOSIS — I252 Old myocardial infarction: Secondary | ICD-10-CM | POA: Diagnosis not present

## 2017-02-14 DIAGNOSIS — Z951 Presence of aortocoronary bypass graft: Secondary | ICD-10-CM | POA: Diagnosis not present

## 2017-02-14 DIAGNOSIS — Z9861 Coronary angioplasty status: Secondary | ICD-10-CM | POA: Diagnosis not present

## 2017-02-14 DIAGNOSIS — Z9889 Other specified postprocedural states: Secondary | ICD-10-CM | POA: Diagnosis not present

## 2017-02-16 DIAGNOSIS — Z951 Presence of aortocoronary bypass graft: Secondary | ICD-10-CM | POA: Diagnosis not present

## 2017-02-16 DIAGNOSIS — Z9889 Other specified postprocedural states: Secondary | ICD-10-CM | POA: Diagnosis not present

## 2017-02-16 DIAGNOSIS — Z9861 Coronary angioplasty status: Secondary | ICD-10-CM | POA: Diagnosis not present

## 2017-02-16 DIAGNOSIS — I252 Old myocardial infarction: Secondary | ICD-10-CM | POA: Diagnosis not present

## 2017-02-19 DIAGNOSIS — Z951 Presence of aortocoronary bypass graft: Secondary | ICD-10-CM | POA: Diagnosis not present

## 2017-02-19 DIAGNOSIS — I252 Old myocardial infarction: Secondary | ICD-10-CM | POA: Diagnosis not present

## 2017-02-19 DIAGNOSIS — Z9889 Other specified postprocedural states: Secondary | ICD-10-CM | POA: Diagnosis not present

## 2017-02-19 DIAGNOSIS — Z9861 Coronary angioplasty status: Secondary | ICD-10-CM | POA: Diagnosis not present

## 2017-02-21 DIAGNOSIS — Z9861 Coronary angioplasty status: Secondary | ICD-10-CM | POA: Diagnosis not present

## 2017-02-21 DIAGNOSIS — Z951 Presence of aortocoronary bypass graft: Secondary | ICD-10-CM | POA: Diagnosis not present

## 2017-02-21 DIAGNOSIS — I252 Old myocardial infarction: Secondary | ICD-10-CM | POA: Diagnosis not present

## 2017-02-21 DIAGNOSIS — Z9889 Other specified postprocedural states: Secondary | ICD-10-CM | POA: Diagnosis not present

## 2017-02-23 DIAGNOSIS — Z951 Presence of aortocoronary bypass graft: Secondary | ICD-10-CM | POA: Diagnosis not present

## 2017-02-23 DIAGNOSIS — Z9889 Other specified postprocedural states: Secondary | ICD-10-CM | POA: Diagnosis not present

## 2017-02-23 DIAGNOSIS — I252 Old myocardial infarction: Secondary | ICD-10-CM | POA: Diagnosis not present

## 2017-02-23 DIAGNOSIS — Z9861 Coronary angioplasty status: Secondary | ICD-10-CM | POA: Diagnosis not present

## 2017-02-25 DIAGNOSIS — Z5181 Encounter for therapeutic drug level monitoring: Secondary | ICD-10-CM | POA: Insufficient documentation

## 2017-02-25 DIAGNOSIS — E039 Hypothyroidism, unspecified: Secondary | ICD-10-CM | POA: Insufficient documentation

## 2017-02-25 DIAGNOSIS — Z79899 Other long term (current) drug therapy: Secondary | ICD-10-CM

## 2017-02-25 NOTE — Progress Notes (Signed)
Cardiology Office Note    Date:  03/09/2017   ID:  Christian Bowman, DOB January 18, 1946, MRN [id number]  PCP:  Patient, No Pcp Per  Cardiologist:  Loralie Champagne, MD; Allegra Lai, MD; Sanda Klein, MD   Chief Complaint  Patient presents with  . Follow-up    History of Present Illness:  Christian Bowman is a 71 y.o. male with extensive and severe cardiac problems.  He underwent redo coronary artery bypass 2 (SVG to PDA, SVG to ramus), as well as mitral valve replacement (bioprosthetic Edwards magna 31 mm) on 50/27/7412, complicated by AV block and atrial flutter requiring cardioversion. Estimated EF 20-25 % on his 07/18/16 TEE. A biventricular defibrillator (Medtronic Claria MRI CRT-D) was implanted on October 24. He has had a couple of episodes of symptomatic atrial flutter and  underwent cardioversion on December 8 and has not had recurrence of atrial arrhythmia since that time, while on antiarrhythmic therapy with amiodarone. On December 18, 2016 he was hospitalized for small non-STEMI and was found to have interval occlusion of the old SVG to PDA, while the more recently placed SVG was still patent. LVEDP was quite low at only 3 mmHg during that catheterization.  He has been going to cardiac rehabilitation 3 days a week and usually does very well. He exercises on the treadmill and other cardio equipment, but enjoys using the free weights most. He had one episode of chest discomfort that required one sublingual nitroglycerin tablet and since then the cardiac rehabilitation staff have limited his access to wait equipment. He only had one other episode of chest discomfort again promptly resolved by one sublingual nitroglycerin, related to being emotionally upset. On both occasions his blood pressure was markedly elevated.  He's only had one dizzy spell that made him sit down on the floor the kitchen. Otherwise he has not had syncope/near syncope or orthostatic dizziness. Unfortunately he tripped over her  shoelace and fell down the basement stairs last week. He has a large bruise on his backside. He hit the side of his neck against the staircase. He was worried he might a broken his neck, but did not have a big head impact. He did not seek medical attention. He has not had problems with coordination, gait, thought/judgment, speech or any focal weakness since the fall. She is fully anticoagulated with warfarin and has not had any overt bleeding except for the bruises on his buttock.  His wife keeps a very detailed log of his blood pressure and weight and these are consistently very steady. His usual blood pressures around 120/70., Often a little higher. His weight is consistently 166.5-170 pounds, most commonly right at 167-168 pounds. He has not had problems with orthopnea or PND.   He understands the need for endocarditis prophylaxis with dental procedures or other medical procedures that might cause bacteremia and the indication for anticoagulation chronically.   Past Medical History:  Diagnosis Date  . Anginal pain ([place])   . CAD (coronary artery disease)   . Coronary artery disease involving coronary bypass graft   . Cyst of neck    right side  . DM2 (diabetes mellitus, type 2) ([place]) 08/26/2013  . Dyspnea   . Heart attack ([place])   . HTN (hypertension) 08/26/2013  . Hyperlipidemia 08/26/2013  . Left main coronary artery disease   . Left renal artery stenosis ([place])    Genesis 6x12 stent 2007  . Obesity (BMI 30.0-34.9) 08/26/2013  . Postoperative atrial fibrillation ([place]) 10/15/2005  .  S/P CABG x 4 10/13/2005   LIMA to LAD, SVG to intermediate branch, sequential SVG to PDA and RPL branch, EVH via right thigh  . S/P mitral valve replacement with bioprosthetic valve 07/11/2016   31 mm [facility] Mitral bovine bioprosthetic tissue valve  . S/P redo CABG x 2 07/11/2016   SVG to PDA and SVG to Intermediate Branch, EVH via left thigh    Past Surgical History:  Procedure Laterality Date  .  CARDIAC CATHETERIZATION N/A 06/21/2016   Procedure: Right/Left Heart Cath and Coronary/Graft Angiography;  Surgeon: Sherren Mocha, MD;  Location: [place] [facility];  Service: Cardiovascular;  Laterality: N/A;  . CARDIOVERSION N/A 07/19/2016   Procedure: CARDIOVERSION;  Surgeon: Lelon Perla, MD;  Location: [facility];  Service: Cardiovascular;  Laterality: N/A;  . CARDIOVERSION N/A 09/08/2016   Procedure: CARDIOVERSION;  Surgeon: Fay Records, MD;  Location: [facility] ENDOSCOPY;  Service: Cardiovascular;  Laterality: N/A;  . CORONARY ARTERY BYPASS GRAFT  10/13/2005   LIMA to LAD, SVG to intermediate branch, sequential SVG to PDA and RPL  . CORONARY ARTERY BYPASS GRAFT N/A 07/11/2016   Procedure: REDO CORONARY ARTERY BYPASS GRAFTING (CABG) x two using left leg greater saphenous vein harvested endoscopically-SVG to PDA -SVG to RAMUS INTERMEDIATE;  Surgeon: Rexene Alberts, MD;  Location: [place];  Service: Open Heart Surgery;  Laterality: N/A;  . CORONARY ARTERY BYPASS GRAFT N/A 07/11/2016   Procedure: Re-exploration (CABG) for post op bleeding,;  Surgeon: Rexene Alberts, MD;  Location: [place];  Service: Open Heart Surgery;  Laterality: N/A;  . EP IMPLANTABLE DEVICE N/A 07/25/2016   Procedure: BiV ICD Insertion CRT-D;  Surgeon: Will Meredith Leeds, MD;  Location: [place] [facility];  Service: Cardiovascular;  Laterality: N/A;  . LEFT HEART CATH AND CORS/GRAFTS ANGIOGRAPHY N/A 12/18/2016   Procedure: Left Heart Cath and Cors/Grafts Angiography;  Surgeon: Sherren Mocha, MD;  Location: [place] [facility];  Service: Cardiovascular;  Laterality: N/A;  . MITRAL VALVE REPLACEMENT N/A 07/11/2016   Procedure: MITRAL VALVE (MV) REPLACEMENT;  Surgeon: Rexene Alberts, MD;  Location: [place];  Service: Open Heart Surgery;  Laterality: N/A;  . MYOCARDICAL PERFUSION  10/09/2007   NORMAL PERFUSION IN ALL REGIONS;NO EVIDENCE OF INDUCIBLE ISCHEMIA;POST STRESS EF% 66  . PERCUTANEOUS CORONARY STENT INTERVENTION (PCI-S)      DES in SVG to right coronary artery system  . RENAL ARTERY STENT Right 2007  . RENAL DOPPLER  03/28/2010   RIGHT RA-NORMAL;LEFT PROXIMAL RA AT STENT-PATENT WITH NO EVIDENCE OF SIGN DIAMETER REDUCTION. R & L KIDNEYS: EQUAL IN SIZE,SYMMETRICAL IN SHAPE.  . TEE WITHOUT CARDIOVERSION N/A 06/15/2016   Procedure: TRANSESOPHAGEAL ECHOCARDIOGRAM (TEE);  Surgeon: Sanda Klein, MD;  Location[facility] ENDOSCOPY;  Service: Cardiovascular;  Laterality: N/A;  . TEE WITHOUT CARDIOVERSION N/A 07/11/2016   Procedure: TRANSESOPHAGEAL ECHOCARDIOGRAM (TEE);  Surgeon: Rexene Alberts, MD;  Location: [place];  Service: Open Heart Surgery;  Laterality: N/A;  . TEE WITHOUT CARDIOVERSION N/A 07/19/2016   Procedure: TRANSESOPHAGEAL ECHOCARDIOGRAM (TEE);  Surgeon: Lelon Perla, MD;  Location: [facility] ENDOSCOPY;  Service: Cardiovascular;  Laterality: N/A;  . TRANSESOPHAGEAL ECHOCARDIOGRAM  10/19/2005   NORMAL LV; MILD TO MODERATE AMOUNT OF SOFT ATHEROMATOUS PLAQUE OF THE THORACIC AORTA; THE LEFT ATRIUM IS MILDLY DILATED;LEFT ATRIAL APPENDAGE FUNCTION IS NORMAL;NO THROMBUS IDENTIFIED. SMALL PFO WITH RIGHT TO LEFT SHUNT    Current Medications: Outpatient Medications Prior to Visit  Medication Sig Dispense Refill  . ACCU-CHEK SOFTCLIX LANCETS lancets Use as  instructed. E11.65 100 each 2  . amiodarone (PACERONE) 200 MG tablet Take 1 tablet (200 mg total) by mouth at bedtime. 30 tablet 3  . amoxicillin (AMOXIL) 500 MG tablet Take 4 tablets (2,000 mg total) by mouth as needed. Pre-dental work only. 4 tablet 2  . aspirin EC 81 MG tablet Take 81 mg by mouth daily.     [name] ENTRESTO 49-51 MG TAKE ONE TABLET BY MOUTH TWICE DAILY 180 tablet 3  . furosemide (LASIX) 20 MG tablet Take 1 tablet (20 mg total) by mouth every other day. 30 tablet 11  . levothyroxine (LEVOXYL) 25 MCG tablet Take 1 tablet (25 mcg total) by mouth daily before breakfast. 90 tablet 3  . metFORMIN (GLUCOPHAGE) 500 MG tablet Take 1 tablet (500 mg total) by mouth 2 (two)  times daily with a meal. 200 tablet 1  . nitroGLYCERIN (NITROSTAT) 0.4 MG SL tablet Place 1 tablet (0.4 mg total) under the tongue every 5 (five) minutes x 3 doses as needed for chest pain. 30 tablet 12  . rosuvastatin (CRESTOR) 10 MG tablet TAKE 1 TABLET BY MOUTH ONCE DAILY 90 tablet 3  . spironolactone (ALDACTONE) 25 MG tablet Take 0.5 tablets (12.5 mg total) by mouth daily. 30 tablet 6  . warfarin (COUMADIN) 5 MG tablet Take 10 mg 3/20 3/21 then take 5 mg daily Sun, Tues, Wed, Fri, and Sat. Take 7.5 mg on all other days. 45 tablet 3  . carvedilol (COREG) 6.25 MG tablet Take 1.5 tablets (9.375 mg total) by mouth 2 (two) times daily. 90 tablet 6  . digoxin (LANOXIN) 0.125 MG tablet Take 0.5 tablets (0.0625 mg total) by mouth daily. 30 tablet 1   No facility-administered medications prior to visit.      Allergies:   Xanax [alprazolam]   Social History   Social History  . Marital status: Married    Spouse name: N/A  . Number of children: N/A  . Years of education: N/A   Social History Main Topics  . Smoking status: Never Smoker  . Smokeless tobacco: Never Used  . Alcohol use No  . Drug use: No  . Sexual activity: Not Currently   Other Topics Concern  . None   Social History Narrative  . None     Family History:  The patient's family history includes Heart attack in his father and mother; Heart disease in his father, maternal grandmother, and mother; Hypertension in his father and sister.   ROS:   Please see the history of present illness.    ROS All other systems reviewed and are negative.   PHYSICAL EXAM:   VS:  BP (!) 150/84   Pulse 64   Ht 6' (1.829 m)   Wt 173 lb 3.2 oz (78.6 kg)   BMI 23.49 kg/m    Recheck BP 127/68 GEN: Well nourished, well developed, in no acute distress  HEENT: normal  Neck: no JVD, carotid bruits, or masses Cardiac: RRR; no murmurs, rubs, or gallops,no edema  Respiratory:  clear to auscultation bilaterally, normal work of breathing GI:  soft, nontender, nondistended, + BS MS: no deformity or atrophy  Skin: warm and dry, no rash Neuro:  Alert and Oriented x 3, Strength and sensation are intact Psych: euthymic mood, full affect  Wt Readings from Last 3 Encounters:  03/09/17 173 lb 3.2 oz (78.6 kg)  01/23/17 174 lb 12.8 oz (79.3 kg)  01/11/17 171 lb (77.6 kg)      Studies/Labs Reviewed:   EKG:  EKG is not ordered today.    Recent Labs: 07/17/2016: Magnesium 2.1 12/15/2016: B Natriuretic Peptide >4,500.0 01/11/2017: ALT 32; BUN 32; Creatinine, Ser 1.78; Hemoglobin 12.7; Platelets 170; Potassium 4.3; Sodium 138; TSH 25.105   Lipid Panel    Component Value Date/Time   CHOL 95 (L) 04/20/2016 0805   TRIG 99 07/17/2016 0335   HDL 28 (L) 04/20/2016 0805   CHOLHDL 3.4 04/20/2016 0805   LDLCALC 45 04/20/2016 0805    ASSESSMENT:    1. Chronic combined systolic and diastolic heart failure ([place])   2. Coronary artery disease involving coronary bypass graft of native heart without angina pectoris   3. Typical atrial flutter ([place])   4. S/P mitral valve replacement with bioprosthetic valve + redo CABG x2   5. Presence of biventricular implantable cardioverter-defibrillator (ICD)   6. Essential hypertension   7. Type 2 diabetes mellitus with vascular disease ([place])   8. Left renal artery stenosis ([facility])   9. Hypothyroidism (acquired)   10. Mixed hyperlipidemia   11. Encounter for monitoring amiodarone therapy      PLAN:  In order of problems listed above:  1. CHF: NYHA class 2, appears clinically euvolemic. Continue carvedilol, Entresto and spironolactone. We'll try to cautiously increase the morning dose of carvedilol only, to 12.5 mg daily. I would start this first on the days that he goes to cardiac rehabilitation, since it may prevent episodes of angina during physical exercise. Will also allow him to be supervised and checked for hypertension if that happens. If he does not have symptomatic hypotension, would then do  that as a daily routine 2. CAD: He has had a couple of spells of angina pectoris. Recently lost one of his old SVG to the RCA, but still has a new patent SVG to PDA as well as patent LIMA to LAD. He is worried that the episodes of angina might represent a new conduit going down. I told him that I'm not surprised that he will have occasional bouts of angina, since there are numerous territories that may still have some viable myocardium and are not revascularized. In addition, the episodes may be occurring since he is more active and since his blood pressures a little higher. I doubt that he has lost a large vessel, since his symptoms resolved so promptly with a single dose of nitroglycerin. 3. AFlutter: With a bioprosthetic mitral valve, CHADSVasc score no longer relevant. Successfully maintaining rhythm on amiodarone since his cardioversion in December. Unless he undergoes successful ablation, plan lifelong anticoagulation. Even if ablation is successful, need to consider high risk for future atrial fibrillation. For the time being, continue amiodarone and warfarin. 4. S/P MVR: On anticoagulation for now.  5. CRT-D: Excellent device function with 100% biventricular pacing since his last cardioversion. 6. Hx HTN, now Hypotension due to meds: Symptomatic events are fewer after we decreased his dose of diuretic. I doubt that he will tolerate a higher dose of Entresto, but I think we should try to increase the beta blocker due to its antianginal and antiarrhythmic benefits. 7. HLP: On statin.  8. DM: Now on metformin monotherapy with substantial weight loss over the last few months weight is in target range. Glycemic control has been excellent. 9. History of left renal artery stenosis status post stent 2007, patent by duplex ultrasound September 2016 10. Hypothyroidism: Gradually adjust thyroid supplements based on TSH and free T4 levels. Suspect he will eventually require a substantially higher dose of  supplement. Will check TSH with  lipids again this month 11. Amiodarone: LFTs normal on very recent lab tests, needs to be rechecked in October    Medication Adjustments/Labs and Tests Ordered: Current medicines are reviewed at length with the patient today.  Concerns regarding medicines are outlined above.  Medication changes, Labs and Tests ordered today are listed in the Patient Instructions below. Patient Instructions  Dr Sallyanne Kuster has recommended making the following medication changes: 1. INCREASE Carvedilol - take 2 tablets in the morning and 1.5 tablets in the evening  Your physician recommends that you schedule a follow-up appointment in SEPTEMBER 2018.  If you need a refill on your cardiac medications before your next appointment, please call your pharmacy.    Signed, Sanda Klein, MD  03/09/2017 5:17 PM    [facility] [address], [place], [place]  [address] Phone: [phone]; Fax: [phone]

## 2017-02-27 ENCOUNTER — Other Ambulatory Visit: Payer: Self-pay | Admitting: Cardiovascular Disease

## 2017-02-28 DIAGNOSIS — Z9889 Other specified postprocedural states: Secondary | ICD-10-CM | POA: Diagnosis not present

## 2017-02-28 DIAGNOSIS — I252 Old myocardial infarction: Secondary | ICD-10-CM | POA: Diagnosis not present

## 2017-02-28 DIAGNOSIS — Z951 Presence of aortocoronary bypass graft: Secondary | ICD-10-CM | POA: Diagnosis not present

## 2017-02-28 DIAGNOSIS — Z9861 Coronary angioplasty status: Secondary | ICD-10-CM | POA: Diagnosis not present

## 2017-03-01 ENCOUNTER — Other Ambulatory Visit: Payer: Self-pay | Admitting: Cardiovascular Disease

## 2017-03-01 NOTE — Telephone Encounter (Signed)
REFILL 

## 2017-03-02 DIAGNOSIS — Z951 Presence of aortocoronary bypass graft: Secondary | ICD-10-CM | POA: Diagnosis not present

## 2017-03-02 DIAGNOSIS — I252 Old myocardial infarction: Secondary | ICD-10-CM | POA: Diagnosis not present

## 2017-03-02 DIAGNOSIS — Z9861 Coronary angioplasty status: Secondary | ICD-10-CM | POA: Diagnosis not present

## 2017-03-02 DIAGNOSIS — Z9889 Other specified postprocedural states: Secondary | ICD-10-CM | POA: Diagnosis not present

## 2017-03-05 DIAGNOSIS — Z9861 Coronary angioplasty status: Secondary | ICD-10-CM | POA: Diagnosis not present

## 2017-03-05 DIAGNOSIS — Z9889 Other specified postprocedural states: Secondary | ICD-10-CM | POA: Diagnosis not present

## 2017-03-05 DIAGNOSIS — Z951 Presence of aortocoronary bypass graft: Secondary | ICD-10-CM | POA: Diagnosis not present

## 2017-03-05 DIAGNOSIS — I252 Old myocardial infarction: Secondary | ICD-10-CM | POA: Diagnosis not present

## 2017-03-06 ENCOUNTER — Other Ambulatory Visit: Payer: Self-pay | Admitting: Cardiology

## 2017-03-07 DIAGNOSIS — Z951 Presence of aortocoronary bypass graft: Secondary | ICD-10-CM | POA: Diagnosis not present

## 2017-03-07 DIAGNOSIS — Z9889 Other specified postprocedural states: Secondary | ICD-10-CM | POA: Diagnosis not present

## 2017-03-07 DIAGNOSIS — Z9861 Coronary angioplasty status: Secondary | ICD-10-CM | POA: Diagnosis not present

## 2017-03-07 DIAGNOSIS — I252 Old myocardial infarction: Secondary | ICD-10-CM | POA: Diagnosis not present

## 2017-03-09 ENCOUNTER — Encounter: Payer: Self-pay | Admitting: Cardiovascular Disease

## 2017-03-09 ENCOUNTER — Ambulatory Visit (INDEPENDENT_AMBULATORY_CARE_PROVIDER_SITE_OTHER): Payer: Medicare Other | Admitting: Cardiovascular Disease

## 2017-03-09 VITALS — BP 150/84 | HR 64 | Ht 72.0 in | Wt 173.2 lb

## 2017-03-09 DIAGNOSIS — I701 Atherosclerosis of renal artery: Secondary | ICD-10-CM | POA: Diagnosis not present

## 2017-03-09 DIAGNOSIS — E039 Hypothyroidism, unspecified: Secondary | ICD-10-CM | POA: Diagnosis not present

## 2017-03-09 DIAGNOSIS — I25810 Atherosclerosis of coronary artery bypass graft(s) without angina pectoris: Secondary | ICD-10-CM

## 2017-03-09 DIAGNOSIS — E782 Mixed hyperlipidemia: Secondary | ICD-10-CM

## 2017-03-09 DIAGNOSIS — Z95810 Presence of automatic (implantable) cardiac defibrillator: Secondary | ICD-10-CM | POA: Diagnosis not present

## 2017-03-09 DIAGNOSIS — I483 Typical atrial flutter: Secondary | ICD-10-CM | POA: Diagnosis not present

## 2017-03-09 DIAGNOSIS — Z5181 Encounter for therapeutic drug level monitoring: Secondary | ICD-10-CM | POA: Diagnosis not present

## 2017-03-09 DIAGNOSIS — Z79899 Other long term (current) drug therapy: Secondary | ICD-10-CM

## 2017-03-09 DIAGNOSIS — I5042 Chronic combined systolic (congestive) and diastolic (congestive) heart failure: Secondary | ICD-10-CM

## 2017-03-09 DIAGNOSIS — Z953 Presence of xenogenic heart valve: Secondary | ICD-10-CM | POA: Diagnosis not present

## 2017-03-09 DIAGNOSIS — E1159 Type 2 diabetes mellitus with other circulatory complications: Secondary | ICD-10-CM | POA: Diagnosis not present

## 2017-03-09 DIAGNOSIS — I10 Essential (primary) hypertension: Secondary | ICD-10-CM | POA: Diagnosis not present

## 2017-03-09 MED ORDER — DIGOXIN 125 MCG PO TABS: 0.0625 mg | ORAL_TABLET | Freq: Every day | ORAL | 11 refills | Status: DC

## 2017-03-09 MED ORDER — CARVEDILOL 6.25 MG PO TABS: ORAL_TABLET | ORAL | 11 refills | Status: DC

## 2017-03-09 NOTE — Patient Instructions (Signed)
Dr Sallyanne Kuster has recommended making the following medication changes: 1. INCREASE Carvedilol - take 2 tablets in the morning and 1.5 tablets in the evening  Your physician recommends that you schedule a follow-up appointment in SEPTEMBER 2018.  If you need a refill on your cardiac medications before your next appointment, please call your pharmacy.

## 2017-03-12 ENCOUNTER — Ambulatory Visit (INDEPENDENT_AMBULATORY_CARE_PROVIDER_SITE_OTHER): Payer: Medicare Other | Admitting: *Deleted

## 2017-03-12 DIAGNOSIS — Z953 Presence of xenogenic heart valve: Secondary | ICD-10-CM

## 2017-03-12 DIAGNOSIS — Z5181 Encounter for therapeutic drug level monitoring: Secondary | ICD-10-CM | POA: Diagnosis not present

## 2017-03-12 DIAGNOSIS — Z9861 Coronary angioplasty status: Secondary | ICD-10-CM | POA: Diagnosis not present

## 2017-03-12 DIAGNOSIS — I701 Atherosclerosis of renal artery: Secondary | ICD-10-CM | POA: Diagnosis not present

## 2017-03-12 DIAGNOSIS — Z951 Presence of aortocoronary bypass graft: Secondary | ICD-10-CM | POA: Diagnosis not present

## 2017-03-12 DIAGNOSIS — Z9889 Other specified postprocedural states: Secondary | ICD-10-CM | POA: Diagnosis not present

## 2017-03-12 DIAGNOSIS — I483 Typical atrial flutter: Secondary | ICD-10-CM | POA: Diagnosis not present

## 2017-03-12 DIAGNOSIS — I252 Old myocardial infarction: Secondary | ICD-10-CM | POA: Diagnosis not present

## 2017-03-12 LAB — POCT INR: INR: 3.4

## 2017-03-14 DIAGNOSIS — Z9861 Coronary angioplasty status: Secondary | ICD-10-CM | POA: Diagnosis not present

## 2017-03-14 DIAGNOSIS — I252 Old myocardial infarction: Secondary | ICD-10-CM | POA: Diagnosis not present

## 2017-03-14 DIAGNOSIS — Z9889 Other specified postprocedural states: Secondary | ICD-10-CM | POA: Diagnosis not present

## 2017-03-14 DIAGNOSIS — Z951 Presence of aortocoronary bypass graft: Secondary | ICD-10-CM | POA: Diagnosis not present

## 2017-03-19 DIAGNOSIS — I252 Old myocardial infarction: Secondary | ICD-10-CM | POA: Diagnosis not present

## 2017-03-19 DIAGNOSIS — Z9889 Other specified postprocedural states: Secondary | ICD-10-CM | POA: Diagnosis not present

## 2017-03-19 DIAGNOSIS — Z9861 Coronary angioplasty status: Secondary | ICD-10-CM | POA: Diagnosis not present

## 2017-03-19 DIAGNOSIS — Z951 Presence of aortocoronary bypass graft: Secondary | ICD-10-CM | POA: Diagnosis not present

## 2017-03-20 DIAGNOSIS — I252 Old myocardial infarction: Secondary | ICD-10-CM | POA: Diagnosis not present

## 2017-03-20 DIAGNOSIS — Z9889 Other specified postprocedural states: Secondary | ICD-10-CM | POA: Diagnosis not present

## 2017-03-20 DIAGNOSIS — Z951 Presence of aortocoronary bypass graft: Secondary | ICD-10-CM | POA: Diagnosis not present

## 2017-03-20 DIAGNOSIS — Z9861 Coronary angioplasty status: Secondary | ICD-10-CM | POA: Diagnosis not present

## 2017-03-23 ENCOUNTER — Telehealth: Payer: Self-pay

## 2017-03-23 DIAGNOSIS — E785 Hyperlipidemia, unspecified: Secondary | ICD-10-CM

## 2017-03-23 DIAGNOSIS — I5042 Chronic combined systolic (congestive) and diastolic (congestive) heart failure: Secondary | ICD-10-CM

## 2017-03-23 DIAGNOSIS — I483 Typical atrial flutter: Secondary | ICD-10-CM

## 2017-03-23 NOTE — Telephone Encounter (Signed)
Spoke with wife, [name], okay per DPR.  Reviewed recommendations. Wife verbalized understanding agreed with plan. Lab slips placed in outgoing mail.

## 2017-03-23 NOTE — Telephone Encounter (Signed)
-----   Message from Sanda Klein, MD sent at 03/09/2017  5:17 PM EDT ----- Not urgent, but some time this month he needs repeat TSH, free T4, fasting lipid profile and BMET. He can have the labs drawn in [place]. Sorry I didn't think of that while he was here. MCr

## 2017-03-26 DIAGNOSIS — Z9861 Coronary angioplasty status: Secondary | ICD-10-CM | POA: Diagnosis not present

## 2017-03-26 DIAGNOSIS — I252 Old myocardial infarction: Secondary | ICD-10-CM | POA: Diagnosis not present

## 2017-03-26 DIAGNOSIS — Z951 Presence of aortocoronary bypass graft: Secondary | ICD-10-CM | POA: Diagnosis not present

## 2017-03-26 DIAGNOSIS — Z9889 Other specified postprocedural states: Secondary | ICD-10-CM | POA: Diagnosis not present

## 2017-03-28 DIAGNOSIS — I252 Old myocardial infarction: Secondary | ICD-10-CM | POA: Diagnosis not present

## 2017-03-28 DIAGNOSIS — Z9889 Other specified postprocedural states: Secondary | ICD-10-CM | POA: Diagnosis not present

## 2017-03-28 DIAGNOSIS — Z951 Presence of aortocoronary bypass graft: Secondary | ICD-10-CM | POA: Diagnosis not present

## 2017-03-28 DIAGNOSIS — Z9861 Coronary angioplasty status: Secondary | ICD-10-CM | POA: Diagnosis not present

## 2017-03-30 DIAGNOSIS — Z9889 Other specified postprocedural states: Secondary | ICD-10-CM | POA: Diagnosis not present

## 2017-03-30 DIAGNOSIS — Z951 Presence of aortocoronary bypass graft: Secondary | ICD-10-CM | POA: Diagnosis not present

## 2017-03-30 DIAGNOSIS — Z9861 Coronary angioplasty status: Secondary | ICD-10-CM | POA: Diagnosis not present

## 2017-03-30 DIAGNOSIS — I252 Old myocardial infarction: Secondary | ICD-10-CM | POA: Diagnosis not present

## 2017-04-01 DIAGNOSIS — Z9889 Other specified postprocedural states: Secondary | ICD-10-CM | POA: Diagnosis not present

## 2017-04-01 DIAGNOSIS — Z9861 Coronary angioplasty status: Secondary | ICD-10-CM | POA: Diagnosis not present

## 2017-04-01 DIAGNOSIS — I252 Old myocardial infarction: Secondary | ICD-10-CM | POA: Diagnosis not present

## 2017-04-01 DIAGNOSIS — Z951 Presence of aortocoronary bypass graft: Secondary | ICD-10-CM | POA: Diagnosis not present

## 2017-04-02 ENCOUNTER — Ambulatory Visit (INDEPENDENT_AMBULATORY_CARE_PROVIDER_SITE_OTHER): Payer: Medicare Other | Admitting: *Deleted

## 2017-04-02 DIAGNOSIS — Z953 Presence of xenogenic heart valve: Secondary | ICD-10-CM | POA: Diagnosis not present

## 2017-04-02 DIAGNOSIS — I483 Typical atrial flutter: Secondary | ICD-10-CM

## 2017-04-02 DIAGNOSIS — I701 Atherosclerosis of renal artery: Secondary | ICD-10-CM | POA: Diagnosis not present

## 2017-04-02 DIAGNOSIS — I252 Old myocardial infarction: Secondary | ICD-10-CM | POA: Diagnosis not present

## 2017-04-02 DIAGNOSIS — Z5181 Encounter for therapeutic drug level monitoring: Secondary | ICD-10-CM

## 2017-04-02 DIAGNOSIS — Z9889 Other specified postprocedural states: Secondary | ICD-10-CM | POA: Diagnosis not present

## 2017-04-02 DIAGNOSIS — Z9861 Coronary angioplasty status: Secondary | ICD-10-CM | POA: Diagnosis not present

## 2017-04-02 DIAGNOSIS — Z951 Presence of aortocoronary bypass graft: Secondary | ICD-10-CM | POA: Diagnosis not present

## 2017-04-02 LAB — POCT INR: INR: 3

## 2017-04-03 DIAGNOSIS — I5042 Chronic combined systolic (congestive) and diastolic (congestive) heart failure: Secondary | ICD-10-CM | POA: Diagnosis not present

## 2017-04-03 DIAGNOSIS — E785 Hyperlipidemia, unspecified: Secondary | ICD-10-CM | POA: Diagnosis not present

## 2017-04-03 DIAGNOSIS — I483 Typical atrial flutter: Secondary | ICD-10-CM | POA: Diagnosis not present

## 2017-04-04 LAB — BASIC METABOLIC PANEL
BUN/Creatinine Ratio: 17 (ref 10–24)
BUN: 31 mg/dL — AB (ref 8–27)
CO2: 24 mmol/L (ref 20–29)
CREATININE: 1.78 mg/dL — AB (ref 0.76–1.27)
Calcium: 9.8 mg/dL (ref 8.6–10.2)
Chloride: 99 mmol/L (ref 96–106)
GFR calc Af Amer: 44 mL/min/{1.73_m2} — ABNORMAL LOW (ref >59)
GFR, EST NON AFRICAN AMERICAN: 38 mL/min/{1.73_m2} — AB (ref >59)
Glucose: 136 mg/dL — ABNORMAL HIGH (ref 65–99)
Potassium: 5.1 mmol/L (ref 3.5–5.2)
Sodium: 141 mmol/L (ref 134–144)

## 2017-04-04 LAB — TSH: TSH: 21.78 u[IU]/mL — ABNORMAL HIGH (ref 0.450–4.500)

## 2017-04-04 LAB — LIPID PANEL
CHOLESTEROL TOTAL: 114 mg/dL (ref 100–199)
Chol/HDL Ratio: 3.9 ratio (ref 0.0–5.0)
HDL: 29 mg/dL — AB (ref >39)
LDL CALC: 63 mg/dL (ref 0–99)
TRIGLYCERIDES: 112 mg/dL (ref 0–149)
VLDL CHOLESTEROL CAL: 22 mg/dL (ref 5–40)

## 2017-04-04 LAB — T4, FREE: Free T4: 1.01 ng/dL (ref 0.82–1.77)

## 2017-04-06 ENCOUNTER — Telehealth: Payer: Self-pay

## 2017-04-06 MED ORDER — LEVOTHYROXINE SODIUM 50 MCG PO TABS: 50.0000 ug | ORAL_TABLET | Freq: Every day | ORAL | 3 refills | Status: DC

## 2017-04-06 NOTE — Telephone Encounter (Signed)
-----   Message from Sanda Klein, MD sent at 04/04/2017  8:29 AM EDT ----- Most labs OK. Kidney tests similar over last 3 months, moderately abnormal. LDL chol good, HDL (as always) very low and hard to change. Thyroid still markedly abnormal. This is amiodarone related. Please increase levothyroxine to 50 mcg daily and recheck in 8 weeks.

## 2017-04-06 NOTE — Telephone Encounter (Signed)
Results reviewed with patient's wife, okay per DPR.   New prescription for levothyroxine sent to pharmacy on file electronically.

## 2017-04-09 ENCOUNTER — Other Ambulatory Visit (HOSPITAL_COMMUNITY): Payer: Self-pay | Admitting: Cardiology

## 2017-04-09 DIAGNOSIS — I5023 Acute on chronic systolic (congestive) heart failure: Secondary | ICD-10-CM

## 2017-04-09 DIAGNOSIS — Z9889 Other specified postprocedural states: Secondary | ICD-10-CM | POA: Diagnosis not present

## 2017-04-09 DIAGNOSIS — Z951 Presence of aortocoronary bypass graft: Secondary | ICD-10-CM | POA: Diagnosis not present

## 2017-04-09 DIAGNOSIS — I252 Old myocardial infarction: Secondary | ICD-10-CM | POA: Diagnosis not present

## 2017-04-09 DIAGNOSIS — Z9861 Coronary angioplasty status: Secondary | ICD-10-CM | POA: Diagnosis not present

## 2017-04-09 MED ORDER — SACUBITRIL-VALSARTAN 49-51 MG PO TABS: 1.0000 | ORAL_TABLET | Freq: Two times a day (BID) | ORAL | 3 refills | Status: DC

## 2017-04-11 ENCOUNTER — Encounter: Payer: Self-pay | Admitting: Cardiovascular Disease

## 2017-04-11 DIAGNOSIS — Z9861 Coronary angioplasty status: Secondary | ICD-10-CM | POA: Diagnosis not present

## 2017-04-11 DIAGNOSIS — Z951 Presence of aortocoronary bypass graft: Secondary | ICD-10-CM | POA: Diagnosis not present

## 2017-04-11 DIAGNOSIS — I252 Old myocardial infarction: Secondary | ICD-10-CM | POA: Diagnosis not present

## 2017-04-11 DIAGNOSIS — Z9889 Other specified postprocedural states: Secondary | ICD-10-CM | POA: Diagnosis not present

## 2017-04-13 DIAGNOSIS — Z9889 Other specified postprocedural states: Secondary | ICD-10-CM | POA: Diagnosis not present

## 2017-04-13 DIAGNOSIS — Z9861 Coronary angioplasty status: Secondary | ICD-10-CM | POA: Diagnosis not present

## 2017-04-13 DIAGNOSIS — Z951 Presence of aortocoronary bypass graft: Secondary | ICD-10-CM | POA: Diagnosis not present

## 2017-04-13 DIAGNOSIS — I252 Old myocardial infarction: Secondary | ICD-10-CM | POA: Diagnosis not present

## 2017-04-16 DIAGNOSIS — Z9889 Other specified postprocedural states: Secondary | ICD-10-CM | POA: Diagnosis not present

## 2017-04-16 DIAGNOSIS — Z9861 Coronary angioplasty status: Secondary | ICD-10-CM | POA: Diagnosis not present

## 2017-04-16 DIAGNOSIS — Z951 Presence of aortocoronary bypass graft: Secondary | ICD-10-CM | POA: Diagnosis not present

## 2017-04-16 DIAGNOSIS — I252 Old myocardial infarction: Secondary | ICD-10-CM | POA: Diagnosis not present

## 2017-04-17 ENCOUNTER — Encounter: Payer: Self-pay | Admitting: Cardiology

## 2017-04-17 ENCOUNTER — Ambulatory Visit (HOSPITAL_COMMUNITY)
Admission: RE | Admit: 2017-04-17 | Discharge: 2017-04-17 | Disposition: A | Discharge status: Home / Self Care | Payer: Medicare Other | Source: Ambulatory Visit | Attending: Cardiology | Admitting: Cardiology

## 2017-04-17 ENCOUNTER — Encounter (HOSPITAL_COMMUNITY): Payer: Self-pay | Admitting: Cardiology

## 2017-04-17 VITALS — BP 144/70 | HR 63 | Wt 171.2 lb

## 2017-04-17 DIAGNOSIS — I5022 Chronic systolic (congestive) heart failure: Secondary | ICD-10-CM | POA: Diagnosis not present

## 2017-04-17 DIAGNOSIS — N183 Chronic kidney disease, stage 3 (moderate): Secondary | ICD-10-CM | POA: Insufficient documentation

## 2017-04-17 DIAGNOSIS — K4090 Unilateral inguinal hernia, without obstruction or gangrene, not specified as recurrent: Secondary | ICD-10-CM | POA: Insufficient documentation

## 2017-04-17 DIAGNOSIS — Z8249 Family history of ischemic heart disease and other diseases of the circulatory system: Secondary | ICD-10-CM | POA: Insufficient documentation

## 2017-04-17 DIAGNOSIS — I5042 Chronic combined systolic (congestive) and diastolic (congestive) heart failure: Secondary | ICD-10-CM | POA: Diagnosis not present

## 2017-04-17 DIAGNOSIS — I25810 Atherosclerosis of coronary artery bypass graft(s) without angina pectoris: Secondary | ICD-10-CM

## 2017-04-17 DIAGNOSIS — E785 Hyperlipidemia, unspecified: Secondary | ICD-10-CM | POA: Diagnosis not present

## 2017-04-17 DIAGNOSIS — Z7901 Long term (current) use of anticoagulants: Secondary | ICD-10-CM | POA: Diagnosis not present

## 2017-04-17 DIAGNOSIS — Z7984 Long term (current) use of oral hypoglycemic drugs: Secondary | ICD-10-CM | POA: Insufficient documentation

## 2017-04-17 DIAGNOSIS — Z79899 Other long term (current) drug therapy: Secondary | ICD-10-CM | POA: Diagnosis not present

## 2017-04-17 DIAGNOSIS — E039 Hypothyroidism, unspecified: Secondary | ICD-10-CM | POA: Diagnosis not present

## 2017-04-17 DIAGNOSIS — I255 Ischemic cardiomyopathy: Secondary | ICD-10-CM | POA: Diagnosis not present

## 2017-04-17 DIAGNOSIS — E1122 Type 2 diabetes mellitus with diabetic chronic kidney disease: Secondary | ICD-10-CM | POA: Diagnosis not present

## 2017-04-17 DIAGNOSIS — I483 Typical atrial flutter: Secondary | ICD-10-CM

## 2017-04-17 DIAGNOSIS — I4892 Unspecified atrial flutter: Secondary | ICD-10-CM | POA: Insufficient documentation

## 2017-04-17 DIAGNOSIS — Z953 Presence of xenogenic heart valve: Secondary | ICD-10-CM | POA: Diagnosis not present

## 2017-04-17 DIAGNOSIS — Z7982 Long term (current) use of aspirin: Secondary | ICD-10-CM | POA: Insufficient documentation

## 2017-04-17 LAB — DIGOXIN LEVEL: Digoxin Level: 0.4 ng/mL — ABNORMAL LOW (ref 0.8–2.0)

## 2017-04-17 NOTE — Progress Notes (Signed)
PCP: Dr Hilma Favors Cardiology: Dr. Sallyanne Kuster HF Cardiology: Dr. Aundra Dubin  71 yo with history of CAD s/p CABG in 2007 then redo CABG in 10/17 with mitral valve replacement presents for cardiology followup.  He was admitted in 10/17 for redo CABG with SVG-PDA and SVG-ramus.  He also had mitral valve replacement with a bioprosthetic valve because of infarct-related mitral regurgitation.  His EF on last study (10/17 TEE) was 20-25%.   Post-operative course was complicated by CHF requiring diuresis.  He also had atrial flutter and required DCCV.  Due to complete heart block, he later got a CRT-D system.    At a prior visit, he was in atrial flutter.  He saw Dr. Curt Bears, it was decided to arrange for DCCV with ablation down the road when PPM leads have been in longer.  He had successful DCCV in 12/17 and is in NSR today.   He was admitted in 3/18 with NSTEMI and chest pain. TnI only 0.5.  LHC showed occlusion of SVG-PDA from CABG#1 to be the likely culprit.  However, SVG-PDA from CABG#2 was patent.  No intervention.  Echo in 3/18 from [place] showed EF 30-35%, stable bioprosthetic mitral valve.   He has been doing cardiac rehab at [facility] in [place].  He had been having rare anginal-type chest pain since admission in 3/18, but today he reports no chest pain for about a month or more.  He had a mechanical fall (tripped) at cardiac rehab last week.  Occasional lightheadedness if he stands up quickly but normally does not get dizzy.  No dyspnea walking on flat ground.  No orthopnea/PND.  He fatigues and gets short of breath walking up inclines.  TSH was high recently and Levoxyl was increased.  Finally, he has a left inguinal hernia in need of repair.  He is in NSR today, no palpitations.  Weight is stable.  BP is elevated here, but never runs this high at home.    Optivol (personally reviewed): Fluid index < threshold, impedance stable.  >99% BiV pacing, possible brief rare atrial arrhythmias, no VT.   Labs (11/17): K  4.8, creatinine 1.89, hgb 12.3, digoxin 0.9, LFTs normal, TSH 8.235 (mild increase), free T3 low, free T4 normal, LFTs normal.  Labs (12/17): K 4.5, creatinine 1.7, BNP 3909 Labs (3/18): K 3.9, creatinine 1.48, hgb 11.3 Labs (4/18): digoxin 0.5, LFTs normal Labs (7/18): K 5.1, creatinine 1.78, LDL 63, HDL 29, TSH elevated  PMH: 1. CAD: CABG 2007.  - LHC (9/17) with patent LIMA-LAD, totally occluded SVG-D, severe stenosis in SVG-PDA.  - Redo CABG 10/17 with SVG-PDA, SVG-ramus and mitral valve replacement.  - NSTEMI 3/18.  LHC with culprit lesion likely occlusion of SVG-PDA from CABG#1.  Patent SVG-PDA from CABG#2.  No intervention.  2. Mitral regurgitation: Ischemic MR, mitral valve replacement was done in 10/17 (bioprosthetic). 3. Complete heart block: Post-op in 10/17.  Medtronic CRT-D placed.  4. Atrial flutter: DCCV 10/17 and again in 12/17.  5. Type II diabetes 6. Hyperlipidemia 7. Chronic systolic CHF: Ischemic cardiomyopathy.   - TEE (10/17): EF 20-25%, severe LV dilation - Echo (3/18, [place]): EF 30-35%, bioprosthetic mitral valve with mean gradient 5 mmHg.  8. Hypothyroidism  SH: Married, retired, lives in [place], nonsmoker, no ETOH.   Family History  Problem Relation Age of Onset  . Heart attack Mother   . Heart disease Mother   . Heart attack Father   . Heart disease Father   . Hypertension Father   . Hypertension  Sister   . Heart disease Maternal Grandmother    ROS: All systems reviewed and negative except as per HPI.   Current Outpatient Prescriptions  Medication Sig Dispense Refill  . ACCU-CHEK SOFTCLIX LANCETS lancets Use as instructed. E11.65 100 each 2  . amiodarone (PACERONE) 200 MG tablet Take 1 tablet (200 mg total) by mouth at bedtime. 30 tablet 3  . aspirin EC 81 MG tablet Take 81 mg by mouth daily.     . carvedilol (COREG) 6.25 MG tablet Take 2 tablets (12.5 mg total) by mouth every morning and take 1.5 tablets (9.375 mg total) by mouth every evening.  105 tablet 11  . digoxin (LANOXIN) 0.125 MG tablet Take 0.5 tablets (0.0625 mg total) by mouth daily. 15 tablet 11  . furosemide (LASIX) 20 MG tablet Take 1 tablet (20 mg total) by mouth every other day. 30 tablet 11  . levothyroxine (SYNTHROID, LEVOTHROID) 50 MCG tablet Take 1 tablet (50 mcg total) by mouth daily before breakfast. 90 tablet 3  . metFORMIN (GLUCOPHAGE) 500 MG tablet Take 1 tablet (500 mg total) by mouth 2 (two) times daily with a meal. 200 tablet 1  . rosuvastatin (CRESTOR) 10 MG tablet TAKE 1 TABLET BY MOUTH ONCE DAILY 90 tablet 3  . sacubitril-valsartan (ENTRESTO) 49-51 MG Take 1 tablet by mouth 2 (two) times daily. 180 tablet 3  . spironolactone (ALDACTONE) 25 MG tablet Take 0.5 tablets (12.5 mg total) by mouth daily. 30 tablet 6  . warfarin (COUMADIN) 5 MG tablet Take 10 mg 3/20 3/21 then take 5 mg daily Sun, Tues, Wed, Fri, and Sat. Take 7.5 mg on all other days. 45 tablet 3  . amoxicillin (AMOXIL) 500 MG tablet Take 4 tablets (2,000 mg total) by mouth as needed. Pre-dental work only. (Patient not taking: Reported on 04/17/2017) 4 tablet 2  . nitroGLYCERIN (NITROSTAT) 0.4 MG SL tablet Place 1 tablet (0.4 mg total) under the tongue every 5 (five) minutes x 3 doses as needed for chest pain. (Patient not taking: Reported on 04/17/2017) 30 tablet 12   No current facility-administered medications for this encounter.    BP (!) 144/70   Pulse 63   Wt 171 lb 4 oz (77.7 kg)   SpO2 98%   BMI 23.23 kg/m  General: NAD Neck: JVP not elevated, no thyromegaly or thyroid nodule. Right neck large lipoma.  Lungs: CTAB. CV: Nonpalpable PMI.  Heart regular S1/S2, no S3/S4, 1/6 SEM RUSB.  No edema.  No carotid bruit.  Normal pedal pulses.  Abdomen: Soft, nontender, no HSM, no distention.  Skin: Intact without lesions or rashes.  Neurologic: Alert and oriented x 3.  Psych: Normal affect. Extremities: No clubbing or cyanosis.  HEENT: Normal.   Assessment/Plan: 1. Chronic systolic CHF:  Ischemic cardiomyopathy.  TEE 10/17 with EF 20-25%.  Has Medtronic CRT-D system. Most recent echo from [facility] in 3/18 with EF 30-35%.  On exam today, he is not volume overloaded, NYHA class II symptoms.  Optivol also does not suggest volume overload.   - Continue Lasix 20 mg every other day, recent BMET stable.   - Continue Entresto 49/51 bid.   - Continue current spironolactone and digoxin.  Check digoxin level.  - Continue Coreg 12.5 qam/9.375 qpm.  Given episodes of lightheadedness with standing, he does not think that he could tolerate higher doses of his cardiac meds.  2. CAD: s/p redo CABG.  Recent admission with NSTEMI, LHC showed occlusion of SVG-PDA from CABG#1 but patent SVG-PDA  from CABG#2, no intervention.  No further chest pain.  - Continue statin => Crestor 10 mg daily. Good lipids in 7/18.  - Continue cardiac rehab.   3. Bioprosthetic mitral valve: Stable on 3/18 echo from [facility]. 4. Atrial flutter: Paroxysmal, he is in NSR today.  He had initial post-op atrial flutter then had a recurrence after he got home.    - He is interested in pursuing atrial flutter ablation at this point.  I will refer him back to Dr. Curt Bears.   - Continue amiodarone until he has had atrial flutter ablation.  Needs regular eye exam.  Check LFTs, TSH today.  I think he can stop amiodarone after a flutter ablation.  - Continue warfarin.  5. CKD: Stage III.  BMET stable recently.   6. Inguinal hernia: Patient is going to be seen at [facility].  He has stable cardiac disease, I think that he would be of reasonable risk to undergo hernia repair if needed.  Would suggest atrial flutter ablation first as surgery may trigger arrhythmia.   Followup in 4 months.    Loralie Champagne 04/17/2017

## 2017-04-17 NOTE — Patient Instructions (Signed)
Lab today  You have been referred to Dr Curt Bears for a-flutter ablation   We will contact you in 4  Cardiac Ablation Cardiac ablation is a procedure to stop some heart tissue from causing problems. The heart has many electrical connections. Sometimes these connections make the heart beat very fast or irregularly. Removing some problem areas can improve the heart rhythm or make it normal. What happens before the procedure?  Follow instructions from your doctor about what you cannot eat or drink.  Ask your doctor about: ? Changing or stopping your normal medicines. This is important if you take diabetes medicines or blood thinners. ? Taking medicines such as aspirin and ibuprofen. These medicines can thin your blood. Do not take these medicines before your procedure if your doctor tells you not to.  Plan to have someone take you home.  If you will be going home right after the procedure, plan to have someone with you for 24 hours. What happens during the procedure?  To lower your risk of infection: ? Your health care team will wash or sanitize their hands. ? Your skin will be washed with soap. ? Hair may be removed from your neck or groin.  An IV tube will be put into one of your veins.  You will be given a medicine to help you relax (sedative).  Skin on your neck or groin will be numbed.  A cut (incision) will be made in your neck or groin.  A needle will be put through your cut and into a vein in your neck or groin.  A tube (catheter) will be put into the needle. The tube will be moved to your heart. X-rays (fluoroscopy) will be used to help guide the tube.  Small devices (electrodes) on the tip of the tube will send out electrical currents.  Dye may be put through the tube. This helps your surgeon see your heart.  Electrical energy will be used to scar (ablate) some heart tissue. Your surgeon may use: ? Heat (radiofrequency energy). ? Laser energy. ? Extreme cold  (cryoablation).  The tube will be taken out.  Pressure will be held on your cut. This helps stop bleeding.  A bandage (dressing) will be put on your cut. The procedure may vary. What happens after the procedure?  You will be monitored until your medicines have worn off.  Your cut will be watched for bleeding. You will need to lie still for a few hours.  Do not drive for 24 hours or as long as your doctor tells you. Summary  Cardiac ablation is a procedure to stop some heart tissue from causing problems.  Electrical energy will be used to scar (ablate) some heart tissue. This information is not intended to replace advice given to you by your health care provider. Make sure you discuss any questions you have with your health care provider. Document Released: 05/21/2013 Document Revised: 08/07/2016 Document Reviewed: 08/07/2016 Elsevier Interactive Patient Education  2017 [employer].

## 2017-04-19 ENCOUNTER — Other Ambulatory Visit: Payer: Self-pay | Admitting: Cardiology

## 2017-04-19 ENCOUNTER — Ambulatory Visit (INDEPENDENT_AMBULATORY_CARE_PROVIDER_SITE_OTHER): Payer: Medicare Other | Admitting: Cardiology

## 2017-04-19 ENCOUNTER — Encounter: Payer: Self-pay | Admitting: Cardiology

## 2017-04-19 ENCOUNTER — Encounter: Payer: Self-pay | Admitting: *Deleted

## 2017-04-19 VITALS — BP 126/64 | HR 60 | Ht 72.0 in | Wt 172.0 lb

## 2017-04-19 DIAGNOSIS — I5023 Acute on chronic systolic (congestive) heart failure: Secondary | ICD-10-CM

## 2017-04-19 DIAGNOSIS — I255 Ischemic cardiomyopathy: Secondary | ICD-10-CM | POA: Diagnosis not present

## 2017-04-19 DIAGNOSIS — I209 Angina pectoris, unspecified: Secondary | ICD-10-CM

## 2017-04-19 DIAGNOSIS — I25708 Atherosclerosis of coronary artery bypass graft(s), unspecified, with other forms of angina pectoris: Secondary | ICD-10-CM

## 2017-04-19 DIAGNOSIS — Z95810 Presence of automatic (implantable) cardiac defibrillator: Secondary | ICD-10-CM

## 2017-04-19 DIAGNOSIS — Z952 Presence of prosthetic heart valve: Secondary | ICD-10-CM | POA: Diagnosis not present

## 2017-04-19 DIAGNOSIS — I10 Essential (primary) hypertension: Secondary | ICD-10-CM | POA: Diagnosis not present

## 2017-04-19 DIAGNOSIS — I5042 Chronic combined systolic (congestive) and diastolic (congestive) heart failure: Secondary | ICD-10-CM

## 2017-04-19 DIAGNOSIS — I483 Typical atrial flutter: Secondary | ICD-10-CM | POA: Diagnosis not present

## 2017-04-19 DIAGNOSIS — I442 Atrioventricular block, complete: Secondary | ICD-10-CM | POA: Diagnosis not present

## 2017-04-19 LAB — CUP PACEART INCLINIC DEVICE CHECK
Battery Remaining Longevity: 91 mo
Battery Voltage: 3.01 V
Brady Statistic AS VP Percent: 34.7 %
Brady Statistic RA Percent Paced: 64.39 %
HighPow Impedance: 54 Ohm
Implantable Lead Implant Date: 20171024
Implantable Lead Implant Date: 20171024
Implantable Lead Location: 753858
Implantable Lead Location: 753859
Implantable Lead Location: 753860
Implantable Lead Model: 5076
Implantable Pulse Generator Implant Date: 20171024
Lead Channel Impedance Value: 160.941
Lead Channel Impedance Value: 160.941
Lead Channel Impedance Value: 171 Ohm
Lead Channel Impedance Value: 171 Ohm
Lead Channel Impedance Value: 304 Ohm
Lead Channel Impedance Value: 342 Ohm
Lead Channel Impedance Value: 342 Ohm
Lead Channel Impedance Value: 418 Ohm
Lead Channel Impedance Value: 456 Ohm
Lead Channel Impedance Value: 551 Ohm
Lead Channel Impedance Value: 551 Ohm
Lead Channel Impedance Value: 589 Ohm
Lead Channel Pacing Threshold Amplitude: 1 V
Lead Channel Pacing Threshold Pulse Width: 0.4 ms
Lead Channel Setting Pacing Amplitude: 1.5 V
Lead Channel Setting Sensing Sensitivity: 0.3 mV
MDC IDC LEAD IMPLANT DT: 20171024
MDC IDC MSMT LEADCHNL LV IMPEDANCE VALUE: 171 Ohm
MDC IDC MSMT LEADCHNL LV IMPEDANCE VALUE: 342 Ohm
MDC IDC MSMT LEADCHNL LV IMPEDANCE VALUE: 551 Ohm
MDC IDC MSMT LEADCHNL LV IMPEDANCE VALUE: 589 Ohm
MDC IDC MSMT LEADCHNL LV PACING THRESHOLD AMPLITUDE: 0.75 V
MDC IDC MSMT LEADCHNL LV PACING THRESHOLD PULSEWIDTH: 1 ms
MDC IDC MSMT LEADCHNL RA SENSING INTR AMPL: 0.375 mV
MDC IDC MSMT LEADCHNL RV IMPEDANCE VALUE: 399 Ohm
MDC IDC MSMT LEADCHNL RV IMPEDANCE VALUE: 475 Ohm
MDC IDC MSMT LEADCHNL RV PACING THRESHOLD AMPLITUDE: 0.75 V
MDC IDC MSMT LEADCHNL RV PACING THRESHOLD PULSEWIDTH: 0.4 ms
MDC IDC MSMT LEADCHNL RV SENSING INTR AMPL: 2.875 mV
MDC IDC SESS DTM: 20180719130148
MDC IDC SET LEADCHNL LV PACING PULSEWIDTH: 1 ms
MDC IDC SET LEADCHNL RA PACING AMPLITUDE: 2 V
MDC IDC SET LEADCHNL RV PACING AMPLITUDE: 2 V
MDC IDC SET LEADCHNL RV PACING PULSEWIDTH: 0.4 ms
MDC IDC STAT BRADY AP VP PERCENT: 62.74 %
MDC IDC STAT BRADY AP VS PERCENT: 2.07 %
MDC IDC STAT BRADY AS VS PERCENT: 0.5 %
MDC IDC STAT BRADY RV PERCENT PACED: 61.22 %

## 2017-04-19 NOTE — Progress Notes (Signed)
Electrophysiology Office Note   Date:  04/19/2017   ID:  Christian Bowman, DOB November 05, 1945, MRN [id number]  PCP:  Patient, No Pcp Per  Cardiologist:  Aundra Dubin Primary Electrophysiologist:  Jesaiah Fabiano Meredith Leeds, MD    Chief Complaint  Patient presents with  . Defib Check    Chronic combined systolic and diastolic heart failure      History of Present Illness: Christian Bowman is a 71 y.o. male who presents today for electrophysiology evaluation.   History of CAD s/p CABG in 2007 then redo CABG in 10/17 with mitral valve replacement. He was admitted in 10/17 for redo CABG with SVG-PDA and SVG-ramus.  He also had mitral valve replacement with a bioprosthetic valve because of infarct-related mitral regurgitation.  His EF on last study (10/17 TEE) was 20-25%.   Post-operative course was complicated by CHF requiring diuresis.  He also had atrial flutter and required DCCV.  Due to complete heart block, he got a CRT-D system.    Today, denies symptoms of palpitations, chest pain, shortness of breath, orthopnea, PND, lower extremity edema, claudication, dizziness, presyncope, syncope, bleeding, or neurologic sequela. The patient is tolerating medications without difficulties. He does complain of fatigue today. He was able to do all of his daily activities up until a few months ago he has been much more fatigued since that time. Otherwise he is doing well. He is not having much in the way of chest pain or shortness of breath.   Past Medical History:  Diagnosis Date  . Anginal pain ([place])   . CAD (coronary artery disease)   . Coronary artery disease involving coronary bypass graft   . Cyst of neck    right side  . DM2 (diabetes mellitus, type 2) ([place]) 08/26/2013  . Dyspnea   . Heart attack ([place])   . HTN (hypertension) 08/26/2013  . Hyperlipidemia 08/26/2013  . Left main coronary artery disease   . Left renal artery stenosis ([place])    Genesis 6x12 stent 2007  . Obesity (BMI 30.0-34.9) 08/26/2013  .  Postoperative atrial fibrillation ([place]) 10/15/2005  . S/P CABG x 4 10/13/2005   LIMA to LAD, SVG to intermediate branch, sequential SVG to PDA and RPL branch, EVH via right thigh  . S/P mitral valve replacement with bioprosthetic valve 07/11/2016   31 mm [facility] Mitral bovine bioprosthetic tissue valve  . S/P redo CABG x 2 07/11/2016   SVG to PDA and SVG to Intermediate Branch, EVH via left thigh   Past Surgical History:  Procedure Laterality Date  . CARDIAC CATHETERIZATION N/A 06/21/2016   Procedure: Right/Left Heart Cath and Coronary/Graft Angiography;  Surgeon: Sherren Mocha, MD;  Location: [place] CV LAB;  Service: Cardiovascular;  Laterality: N/A;  . CARDIOVERSION N/A 07/19/2016   Procedure: CARDIOVERSION;  Surgeon: Lelon Perla, MD;  Location: [facility] ENDOSCOPY;  Service: Cardiovascular;  Laterality: N/A;  . CARDIOVERSION N/A 09/08/2016   Procedure: CARDIOVERSION;  Surgeon: Fay Records, MD;  Location: [facility] ENDOSCOPY;  Service: Cardiovascular;  Laterality: N/A;  . CORONARY ARTERY BYPASS GRAFT  10/13/2005   LIMA to LAD, SVG to intermediate branch, sequential SVG to PDA and RPL  . CORONARY ARTERY BYPASS GRAFT N/A 07/11/2016   Procedure: REDO CORONARY ARTERY BYPASS GRAFTING (CABG) x two using left leg greater saphenous vein harvested endoscopically-SVG to PDA -SVG to RAMUS INTERMEDIATE;  Surgeon: Rexene Alberts, MD;  Location: [place];  Service: Open Heart Surgery;  Laterality: N/A;  . CORONARY ARTERY BYPASS GRAFT  N/A 07/11/2016   Procedure: Re-exploration (CABG) for post op bleeding,;  Surgeon: Rexene Alberts, MD;  Location: [place];  Service: Open Heart Surgery;  Laterality: N/A;  . EP IMPLANTABLE DEVICE N/A 07/25/2016   Procedure: BiV ICD Insertion CRT-D;  Surgeon: Daija Routson Meredith Leeds, MD;  Location: [place] CV LAB;  Service: Cardiovascular;  Laterality: N/A;  . LEFT HEART CATH AND CORS/GRAFTS ANGIOGRAPHY N/A 12/18/2016   Procedure: Left Heart Cath and Cors/Grafts Angiography;   Surgeon: Sherren Mocha, MD;  Location: [place] CV LAB;  Service: Cardiovascular;  Laterality: N/A;  . MITRAL VALVE REPLACEMENT N/A 07/11/2016   Procedure: MITRAL VALVE (MV) REPLACEMENT;  Surgeon: Rexene Alberts, MD;  Location: [place];  Service: Open Heart Surgery;  Laterality: N/A;  . MYOCARDICAL PERFUSION  10/09/2007   NORMAL PERFUSION IN ALL REGIONS;NO EVIDENCE OF INDUCIBLE ISCHEMIA;POST STRESS EF% 66  . PERCUTANEOUS CORONARY STENT INTERVENTION (PCI-S)     DES in SVG to right coronary artery system  . RENAL ARTERY STENT Right 2007  . RENAL DOPPLER  03/28/2010   RIGHT RA-NORMAL;LEFT PROXIMAL RA AT STENT-PATENT WITH NO EVIDENCE OF SIGN DIAMETER REDUCTION. R & L KIDNEYS: EQUAL IN SIZE,SYMMETRICAL IN SHAPE.  . TEE WITHOUT CARDIOVERSION N/A 06/15/2016   Procedure: TRANSESOPHAGEAL ECHOCARDIOGRAM (TEE);  Surgeon: Sanda Klein, MD;  Location: [facility] ENDOSCOPY;  Service: Cardiovascular;  Laterality: N/A;  . TEE WITHOUT CARDIOVERSION N/A 07/11/2016   Procedure: TRANSESOPHAGEAL ECHOCARDIOGRAM (TEE);  Surgeon: Rexene Alberts, MD;  Location: [place];  Service: Open Heart Surgery;  Laterality: N/A;  . TEE WITHOUT CARDIOVERSION N/A 07/19/2016   Procedure: TRANSESOPHAGEAL ECHOCARDIOGRAM (TEE);  Surgeon: Lelon Perla, MD;  Location: [facility] ENDOSCOPY;  Service: Cardiovascular;  Laterality: N/A;  . TRANSESOPHAGEAL ECHOCARDIOGRAM  10/19/2005   NORMAL LV; MILD TO MODERATE AMOUNT OF SOFT ATHEROMATOUS PLAQUE OF THE THORACIC AORTA; THE LEFT ATRIUM IS MILDLY DILATED;LEFT ATRIAL APPENDAGE FUNCTION IS NORMAL;NO THROMBUS IDENTIFIED. SMALL PFO WITH RIGHT TO LEFT SHUNT     Current Outpatient Prescriptions  Medication Sig Dispense Refill  . ACCU-CHEK SOFTCLIX LANCETS lancets Use as instructed. E11.65 100 each 2  . amiodarone (PACERONE) 200 MG tablet Take 1 tablet (200 mg total) by mouth at bedtime. 30 tablet 3  . amoxicillin (AMOXIL) 500 MG tablet Take 4 tablets (2,000 mg total) by mouth as needed. Pre-dental work only. 4  tablet 2  . aspirin EC 81 MG tablet Take 81 mg by mouth daily.     . carvedilol (COREG) 6.25 MG tablet Take 2 tablets (12.5 mg total) by mouth every morning and take 1.5 tablets (9.375 mg total) by mouth every evening. 105 tablet 11  . digoxin (LANOXIN) 0.125 MG tablet Take 0.5 tablets (0.0625 mg total) by mouth daily. 15 tablet 11  . furosemide (LASIX) 20 MG tablet Take 1 tablet (20 mg total) by mouth every other day. 30 tablet 11  . levothyroxine (SYNTHROID, LEVOTHROID) 50 MCG tablet Take 1 tablet (50 mcg total) by mouth daily before breakfast. 90 tablet 3  . metFORMIN (GLUCOPHAGE) 500 MG tablet Take 1 tablet (500 mg total) by mouth 2 (two) times daily with a meal. 200 tablet 1  . nitroGLYCERIN (NITROSTAT) 0.4 MG SL tablet Place 1 tablet (0.4 mg total) under the tongue every 5 (five) minutes x 3 doses as needed for chest pain. 30 tablet 12  . rosuvastatin (CRESTOR) 10 MG tablet TAKE 1 TABLET BY MOUTH ONCE DAILY 90 tablet 3  . sacubitril-valsartan (ENTRESTO) 49-51 MG Take 1 tablet by mouth 2 (  two) times daily. 180 tablet 3  . spironolactone (ALDACTONE) 25 MG tablet Take 0.5 tablets (12.5 mg total) by mouth daily. 30 tablet 6  . warfarin (COUMADIN) 5 MG tablet Take 10 mg 3/20 3/21 then take 5 mg daily Sun, Tues, Wed, Fri, and Sat. Take 7.5 mg on all other days. 45 tablet 3   No current facility-administered medications for this visit.     Allergies:   Xanax [alprazolam]   Social History:  The patient  reports that he has never smoked. He has never used smokeless tobacco. He reports that he does not drink alcohol or use drugs.   Family History:  The patient's family history includes Heart attack in his father and mother; Heart disease in his father, maternal grandmother, and mother; Hypertension in his father and sister.    ROS:  Please see the history of present illness.   Otherwise, review of systems is positive for Chest pain, dyspnea on exertion, abdominal pain, balance problems, dizziness.    All other systems are reviewed and negative.   PHYSICAL EXAM: VS:  BP 126/64   Pulse 60   Ht 6' (1.829 m)   Wt 172 lb (78 kg)   BMI 23.33 kg/m  , BMI Body mass index is 23.33 kg/m. GEN: Well nourished, well developed, in no acute distress  HEENT: normal  Neck: no JVD, carotid bruits, or masses Cardiac: RRR; no murmurs, rubs, or gallops,no edema  Respiratory:  clear to auscultation bilaterally, normal work of breathing GI: soft, nontender, nondistended, + BS MS: no deformity or atrophy  Skin: warm and dry, device site well healed Neuro:  Strength and sensation are intact Psych: euthymic mood, full affect  EKG:  EKG is ordered today. Personal review of the ekg ordered shows sinus rhythm, V pacing  Personal review of the device interrogation today. Results in [place]: 07/17/2016: Magnesium 2.1 12/15/2016: B Natriuretic Peptide >4,500.0 01/11/2017: ALT 32; Hemoglobin 12.7; Platelets 170 04/03/2017: BUN 31; Creatinine, Ser 1.78; Potassium 5.1; Sodium 141; TSH 21.780    Lipid Panel     Component Value Date/Time   CHOL 114 04/03/2017 0815   TRIG 112 04/03/2017 0815   HDL 29 (L) 04/03/2017 0815   CHOLHDL 3.9 04/03/2017 0815   LDLCALC 63 04/03/2017 0815     Wt Readings from Last 3 Encounters:  04/19/17 172 lb (78 kg)  04/17/17 171 lb 4 oz (77.7 kg)  03/09/17 173 lb 3.2 oz (78.6 kg)      Other studies Reviewed: Additional studies/ records that were reviewed today include: TEE 07/19/16  Review of the above records today demonstrates:  - Left ventricle: The cavity size was severely dilated. Systolic   function was severely reduced. The estimated ejection fraction   was in the range of 20% to 25%. Diffuse hypokinesis. - Aortic valve: No evidence of vegetation. - Mitral valve: A bioprosthesis was present. - Left atrium: The atrium was moderately dilated. No evidence of   thrombus in the atrial cavity or appendage. - Right ventricle: Systolic function was  moderately reduced. - Right atrium: No evidence of thrombus in the atrial cavity or   appendage. - Atrial septum: No defect or patent foramen ovale was identified. - Tricuspid valve: No evidence of vegetation. There was   mild-moderate regurgitation. - Pulmonic valve: No evidence of vegetation.  Cath 06/20/17 1. Severe native three-vessel coronary artery disease with total occlusion of the LAD, total occlusion of the ramus intermedius, and total occlusion of the  RCA 2. Continued patency of the LIMA to LAD with total occlusion of the apical LAD appearance unchanged from previous cath study 3. Patency of the most recent saphenous vein graft to PDA without significant stenosis present 4. Interval occlusion of the old saphenous vein graft PDA 5. Nonvisualization of the saphenous vein graft to diagonal, suspect total occlusion from nonselective imaging 6. Low LVEDP  ASSESSMENT AND PLAN:  1.  Atrial flutter: In sinus rhythm today on Coumadin and amiodarone. Twice a day he'll to get off of the amiodarone and thus [place] plan for ablation. Risks and benefits were discussed. Risks include but not limited to bleeding, tamponade, heart block, stroke, and dislodgment of current ICD leads. He does understand the risks and has agreed to the procedure.  This patients CHA2DS2-VASc Score and unadjusted Ischemic Stroke Rate (% per year) is equal to 4.8 % stroke rate/year from a score of 4  Above score calculated as 1 point each if present [CHF, HTN, DM, Vascular=MI/PAD/Aortic Plaque, Age if 65-74, or Male] Above score calculated as 2 points each if present [Age > 75, or Stroke/TIA/TE]  2. Complete AV block: Status post CRT-D. Device interrogation shows both P waves and R waves have dropped since implantation. Sensitivities have been adjusted today.  3. Chronic systolic heart failure: On optimal medical therapy today. No signs of volume overload.  4. Mitral regurgitation: Status post redo MVR.  5. CAD:  s/p redo CABG. chronic coronary disease as per recent catheterization. Angina has been stable.     Current medicines are reviewed at length with the patient today.   The patient does not have concerns regarding his medicines.  The following changes were made today:  none  Labs/ tests ordered today include:  Orders Placed This Encounter  Procedures  . EKG 12-Lead     Disposition:   FU with Shalon Councilman 1.5 months  Signed, Lynwood Kubisiak Meredith Leeds, MD  04/19/2017 11:47 AM     [facility] [address] [place] [address] [phone] (office) [phone] (fax)

## 2017-04-19 NOTE — Patient Instructions (Addendum)
Medication Instructions:    Your physician recommends that you continue on your current medications as directed. Please refer to the Current Medication list given to you today.  MAKE SURE TO CONTINUE TAKING  YOUR COUMADIN FOR YOUR ABLATION PROCEDURE.  YOU WILL NOT STOP THIS MEDICATION PRIOR TO THE PROCEDURE.  - If you need a refill on your cardiac medications before your next appointment, please call your pharmacy.   Labwork:  Please have pre procedure lab work completed between: 04/27/2017 - 05/08/2017 at [facility] in [place] -- handwritten prescription given to you   Testing/Procedures: Your physician has recommended that you have an ablation. Catheter ablation is a medical procedure used to treat some cardiac arrhythmias (irregular heartbeats). During catheter ablation, a long, thin, flexible tube is put into a blood vessel in your groin (upper thigh), or neck. This tube is called an ablation catheter. It is then guided to your heart through the blood vessel. Radio frequency waves destroy small areas of heart tissue where abnormal heartbeats may cause an arrhythmia to start. Please see the instruction sheet given to you today.  Follow-Up:  Your physician recommends that you schedule a follow-up appointment in: 4 weeks, after your procedure on 05/11/2017, with Dr. Curt Bears.  Thank you for choosing [facility]!!   Trinidad Curet, RN [phone]  Any Other Special Instructions Will Be Listed Below (If Applicable).   Cardiac Ablation Cardiac ablation is a procedure to disable (ablate) a small amount of heart tissue in very specific places. The heart has many electrical connections. Sometimes these connections are abnormal and can cause the heart to beat very fast or irregularly. Ablating some of the problem areas can improve the heart rhythm or return it to normal. Ablation may be done for people who:  Have Wolff-Parkinson-White syndrome.  Have fast heart rhythms (tachycardia).  Have  taken medicines for an abnormal heart rhythm (arrhythmia) that were not effective or caused side effects.  Have a high-risk heartbeat that may be life-threatening.  During the procedure, a small incision is made in the neck or the groin, and a long, thin, flexible tube (catheter) is inserted into the incision and moved to the heart. Small devices (electrodes) on the tip of the catheter will send out electrical currents. A type of X-ray (fluoroscopy) will be used to help guide the catheter and to provide images of the heart. Tell a health care provider about:  Any allergies you have.  All medicines you are taking, including vitamins, herbs, eye drops, creams, and over-the-counter medicines.  Any problems you or family members have had with anesthetic medicines.  Any blood disorders you have.  Any surgeries you have had.  Any medical conditions you have, such as kidney failure.  Whether you are pregnant or may be pregnant. What are the risks? Generally, this is a safe procedure. However, problems may occur, including:  Infection.  Bruising and bleeding at the catheter insertion site.  Bleeding into the chest, especially into the sac that surrounds the heart. This is a serious complication.  Stroke or blood clots.  Damage to other structures or organs.  Allergic reaction to medicines or dyes.  Need for a permanent pacemaker if the normal electrical system is damaged. A pacemaker is a small computer that sends electrical signals to the heart and helps your heart beat normally.  The procedure not being fully effective. This may not be recognized until months later. Repeat ablation procedures are sometimes required.  What happens before the procedure?  Follow  instructions from your health care provider about eating or drinking restrictions.  Ask your health care provider about: ? Changing or stopping your regular medicines. This is especially important if you are taking diabetes  medicines or blood thinners. ? Taking medicines such as aspirin and ibuprofen. These medicines can thin your blood. Do not take these medicines before your procedure if your health care provider instructs you not to.  Plan to have someone take you home from the hospital or clinic.  If you will be going home right after the procedure, plan to have someone with you for 24 hours. What happens during the procedure?  To lower your risk of infection: ? Your health care team will wash or sanitize their hands. ? Your skin will be washed with soap. ? Hair may be removed from the incision area.  An IV tube will be inserted into one of your veins.  You will be given a medicine to help you relax (sedative).  The skin on your neck or groin will be numbed.  An incision will be made in your neck or your groin.  A needle will be inserted through the incision and into a large vein in your neck or groin.  A catheter will be inserted into the needle and moved to your heart.  Dye may be injected through the catheter to help your surgeon see the area of the heart that needs treatment.  Electrical currents will be sent from the catheter to ablate heart tissue in desired areas. There are three types of energy that may be used to ablate heart tissue: ? Heat (radiofrequency energy). ? Laser energy. ? Extreme cold (cryoablation).  When the necessary tissue has been ablated, the catheter will be removed.  Pressure will be held on the catheter insertion area to prevent excessive bleeding.  A bandage (dressing) will be placed over the catheter insertion area. The procedure may vary among health care providers and hospitals. What happens after the procedure?  Your blood pressure, heart rate, breathing rate, and blood oxygen level will be monitored until the medicines you were given have worn off.  Your catheter insertion area will be monitored for bleeding. You will need to lie still for a few hours to  ensure that you do not bleed from the catheter insertion area.  Do not drive for 24 hours or as long as directed by your health care provider. Summary  Cardiac ablation is a procedure to disable (ablate) a small amount of heart tissue in very specific places. Ablating some of the problem areas can improve the heart rhythm or return it to normal.  During the procedure, electrical currents will be sent from the catheter to ablate heart tissue in desired areas. This information is not intended to replace advice given to you by your health care provider. Make sure you discuss any questions you have with your health care provider. Document Released: 02/04/2009 Document Revised: 08/07/2016 Document Reviewed: 08/07/2016 Elsevier Interactive Patient Education  [employer].

## 2017-04-23 DIAGNOSIS — Z951 Presence of aortocoronary bypass graft: Secondary | ICD-10-CM | POA: Diagnosis not present

## 2017-04-23 DIAGNOSIS — Z9889 Other specified postprocedural states: Secondary | ICD-10-CM | POA: Diagnosis not present

## 2017-04-23 DIAGNOSIS — I252 Old myocardial infarction: Secondary | ICD-10-CM | POA: Diagnosis not present

## 2017-04-23 DIAGNOSIS — Z9861 Coronary angioplasty status: Secondary | ICD-10-CM | POA: Diagnosis not present

## 2017-04-25 ENCOUNTER — Encounter: Payer: Self-pay | Admitting: Cardiology

## 2017-04-25 DIAGNOSIS — Z9861 Coronary angioplasty status: Secondary | ICD-10-CM | POA: Diagnosis not present

## 2017-04-25 DIAGNOSIS — Z9889 Other specified postprocedural states: Secondary | ICD-10-CM | POA: Diagnosis not present

## 2017-04-25 DIAGNOSIS — I252 Old myocardial infarction: Secondary | ICD-10-CM | POA: Diagnosis not present

## 2017-04-25 DIAGNOSIS — Z951 Presence of aortocoronary bypass graft: Secondary | ICD-10-CM | POA: Diagnosis not present

## 2017-04-27 DIAGNOSIS — Z9889 Other specified postprocedural states: Secondary | ICD-10-CM | POA: Diagnosis not present

## 2017-04-27 DIAGNOSIS — Z951 Presence of aortocoronary bypass graft: Secondary | ICD-10-CM | POA: Diagnosis not present

## 2017-04-27 DIAGNOSIS — Z9861 Coronary angioplasty status: Secondary | ICD-10-CM | POA: Diagnosis not present

## 2017-04-27 DIAGNOSIS — I252 Old myocardial infarction: Secondary | ICD-10-CM | POA: Diagnosis not present

## 2017-04-30 ENCOUNTER — Ambulatory Visit (INDEPENDENT_AMBULATORY_CARE_PROVIDER_SITE_OTHER): Payer: Medicare Other | Admitting: *Deleted

## 2017-04-30 ENCOUNTER — Telehealth: Payer: Self-pay | Admitting: Cardiology

## 2017-04-30 DIAGNOSIS — Z5181 Encounter for therapeutic drug level monitoring: Secondary | ICD-10-CM

## 2017-04-30 DIAGNOSIS — I255 Ischemic cardiomyopathy: Secondary | ICD-10-CM

## 2017-04-30 DIAGNOSIS — I483 Typical atrial flutter: Secondary | ICD-10-CM

## 2017-04-30 DIAGNOSIS — Z9889 Other specified postprocedural states: Secondary | ICD-10-CM | POA: Diagnosis not present

## 2017-04-30 DIAGNOSIS — Z953 Presence of xenogenic heart valve: Secondary | ICD-10-CM

## 2017-04-30 DIAGNOSIS — Z951 Presence of aortocoronary bypass graft: Secondary | ICD-10-CM | POA: Diagnosis not present

## 2017-04-30 DIAGNOSIS — Z9861 Coronary angioplasty status: Secondary | ICD-10-CM | POA: Diagnosis not present

## 2017-04-30 DIAGNOSIS — I252 Old myocardial infarction: Secondary | ICD-10-CM | POA: Diagnosis not present

## 2017-04-30 LAB — POCT INR: INR: 3.9

## 2017-04-30 NOTE — Telephone Encounter (Signed)
Confirmed remote transmission w/ pt wife.   

## 2017-05-01 NOTE — Progress Notes (Signed)
Remote ICD transmission.   

## 2017-05-02 DIAGNOSIS — I252 Old myocardial infarction: Secondary | ICD-10-CM | POA: Diagnosis not present

## 2017-05-02 DIAGNOSIS — Z9889 Other specified postprocedural states: Secondary | ICD-10-CM | POA: Diagnosis not present

## 2017-05-02 DIAGNOSIS — Z951 Presence of aortocoronary bypass graft: Secondary | ICD-10-CM | POA: Diagnosis not present

## 2017-05-02 DIAGNOSIS — Z9861 Coronary angioplasty status: Secondary | ICD-10-CM | POA: Diagnosis not present

## 2017-05-03 LAB — CUP PACEART REMOTE DEVICE CHECK
Battery Voltage: 2.99 V
Brady Statistic AP VP Percent: 64.2 %
Brady Statistic AS VP Percent: 34.56 %
Brady Statistic RA Percent Paced: 62.85 %
Brady Statistic RV Percent Paced: 56.06 %
Date Time Interrogation Session: 20180730165026
HighPow Impedance: 56 Ohm
Implantable Lead Implant Date: 20171024
Implantable Lead Implant Date: 20171024
Implantable Lead Location: 753859
Lead Channel Impedance Value: 175.622
Lead Channel Impedance Value: 175.622
Lead Channel Impedance Value: 180.5 Ohm
Lead Channel Impedance Value: 180.5 Ohm
Lead Channel Impedance Value: 342 Ohm
Lead Channel Impedance Value: 361 Ohm
Lead Channel Impedance Value: 361 Ohm
Lead Channel Impedance Value: 456 Ohm
Lead Channel Impedance Value: 456 Ohm
Lead Channel Impedance Value: 475 Ohm
Lead Channel Impedance Value: 608 Ohm
Lead Channel Impedance Value: 608 Ohm
Lead Channel Impedance Value: 608 Ohm
Lead Channel Pacing Threshold Pulse Width: 0.4 ms
Lead Channel Sensing Intrinsic Amplitude: 2 mV
Lead Channel Sensing Intrinsic Amplitude: 2 mV
Lead Channel Sensing Intrinsic Amplitude: 2.5 mV
Lead Channel Sensing Intrinsic Amplitude: 2.5 mV
Lead Channel Setting Pacing Amplitude: 1.75 V
Lead Channel Setting Pacing Amplitude: 2 V
Lead Channel Setting Pacing Pulse Width: 0.4 ms
Lead Channel Setting Pacing Pulse Width: 1 ms
MDC IDC LEAD IMPLANT DT: 20171024
MDC IDC LEAD LOCATION: 753858
MDC IDC LEAD LOCATION: 753860
MDC IDC MSMT BATTERY REMAINING LONGEVITY: 90 mo
MDC IDC MSMT LEADCHNL LV IMPEDANCE VALUE: 180.5 Ohm
MDC IDC MSMT LEADCHNL LV IMPEDANCE VALUE: 342 Ohm
MDC IDC MSMT LEADCHNL LV IMPEDANCE VALUE: 361 Ohm
MDC IDC MSMT LEADCHNL LV IMPEDANCE VALUE: 589 Ohm
MDC IDC MSMT LEADCHNL LV IMPEDANCE VALUE: 608 Ohm
MDC IDC MSMT LEADCHNL LV PACING THRESHOLD AMPLITUDE: 0.75 V
MDC IDC MSMT LEADCHNL LV PACING THRESHOLD PULSEWIDTH: 1 ms
MDC IDC MSMT LEADCHNL RA PACING THRESHOLD AMPLITUDE: 0.875 V
MDC IDC MSMT LEADCHNL RV PACING THRESHOLD AMPLITUDE: 0.5 V
MDC IDC MSMT LEADCHNL RV PACING THRESHOLD PULSEWIDTH: 0.4 ms
MDC IDC PG IMPLANT DT: 20171024
MDC IDC SET LEADCHNL LV PACING AMPLITUDE: 1.75 V
MDC IDC SET LEADCHNL RV SENSING SENSITIVITY: 0.3 mV
MDC IDC STAT BRADY AP VS PERCENT: 0.67 %
MDC IDC STAT BRADY AS VS PERCENT: 0.56 %

## 2017-05-04 ENCOUNTER — Encounter: Payer: Self-pay | Admitting: Cardiology

## 2017-05-04 DIAGNOSIS — Z9861 Coronary angioplasty status: Secondary | ICD-10-CM | POA: Diagnosis not present

## 2017-05-04 DIAGNOSIS — Z9889 Other specified postprocedural states: Secondary | ICD-10-CM | POA: Diagnosis not present

## 2017-05-04 DIAGNOSIS — Z951 Presence of aortocoronary bypass graft: Secondary | ICD-10-CM | POA: Diagnosis not present

## 2017-05-04 DIAGNOSIS — I252 Old myocardial infarction: Secondary | ICD-10-CM | POA: Diagnosis not present

## 2017-05-07 ENCOUNTER — Other Ambulatory Visit: Payer: Self-pay | Admitting: Cardiology

## 2017-05-07 ENCOUNTER — Ambulatory Visit (INDEPENDENT_AMBULATORY_CARE_PROVIDER_SITE_OTHER): Payer: Medicare Other | Admitting: *Deleted

## 2017-05-07 DIAGNOSIS — Z01812 Encounter for preprocedural laboratory examination: Secondary | ICD-10-CM | POA: Diagnosis not present

## 2017-05-07 DIAGNOSIS — Z953 Presence of xenogenic heart valve: Secondary | ICD-10-CM

## 2017-05-07 DIAGNOSIS — I483 Typical atrial flutter: Secondary | ICD-10-CM | POA: Diagnosis not present

## 2017-05-07 DIAGNOSIS — I255 Ischemic cardiomyopathy: Secondary | ICD-10-CM

## 2017-05-07 DIAGNOSIS — Z5181 Encounter for therapeutic drug level monitoring: Secondary | ICD-10-CM | POA: Diagnosis not present

## 2017-05-07 DIAGNOSIS — Z9889 Other specified postprocedural states: Secondary | ICD-10-CM | POA: Diagnosis not present

## 2017-05-07 DIAGNOSIS — Z951 Presence of aortocoronary bypass graft: Secondary | ICD-10-CM | POA: Diagnosis not present

## 2017-05-07 DIAGNOSIS — I252 Old myocardial infarction: Secondary | ICD-10-CM | POA: Diagnosis not present

## 2017-05-07 DIAGNOSIS — Z9861 Coronary angioplasty status: Secondary | ICD-10-CM | POA: Diagnosis not present

## 2017-05-07 LAB — POCT INR: INR: 2.9

## 2017-05-08 ENCOUNTER — Encounter: Payer: Self-pay | Admitting: Cardiology

## 2017-05-08 LAB — CBC WITH DIFFERENTIAL/PLATELET
BASOS ABS: 0 10*3/uL (ref 0.0–0.2)
Basos: 0 %
EOS (ABSOLUTE): 0.3 10*3/uL (ref 0.0–0.4)
EOS: 5 %
HEMATOCRIT: 39.5 % (ref 37.5–51.0)
HEMOGLOBIN: 12.9 g/dL — AB (ref 13.0–17.7)
Immature Grans (Abs): 0 10*3/uL (ref 0.0–0.1)
Immature Granulocytes: 0 %
LYMPHS ABS: 1 10*3/uL (ref 0.7–3.1)
Lymphs: 14 %
MCH: 29.5 pg (ref 26.6–33.0)
MCHC: 32.7 g/dL (ref 31.5–35.7)
MCV: 90 fL (ref 79–97)
MONOCYTES: 9 %
Monocytes Absolute: 0.7 10*3/uL (ref 0.1–0.9)
NEUTROS ABS: 5.1 10*3/uL (ref 1.4–7.0)
Neutrophils: 72 %
Platelets: 185 10*3/uL (ref 150–379)
RBC: 4.37 x10E6/uL (ref 4.14–5.80)
RDW: 14.6 % (ref 12.3–15.4)
WBC: 7 10*3/uL (ref 3.4–10.8)

## 2017-05-08 LAB — BASIC METABOLIC PANEL
BUN/Creatinine Ratio: 17 (ref 10–24)
BUN: 30 mg/dL — AB (ref 8–27)
CALCIUM: 9.9 mg/dL (ref 8.6–10.2)
CHLORIDE: 96 mmol/L (ref 96–106)
CO2: 24 mmol/L (ref 20–29)
Creatinine, Ser: 1.78 mg/dL — ABNORMAL HIGH (ref 0.76–1.27)
GFR calc non Af Amer: 38 mL/min/{1.73_m2} — ABNORMAL LOW (ref >59)
GFR, EST AFRICAN AMERICAN: 44 mL/min/{1.73_m2} — AB (ref >59)
GLUCOSE: 173 mg/dL — AB (ref 65–99)
POTASSIUM: 4.6 mmol/L (ref 3.5–5.2)
Sodium: 136 mmol/L (ref 134–144)

## 2017-05-09 ENCOUNTER — Telehealth: Payer: Self-pay | Admitting: *Deleted

## 2017-05-09 NOTE — Telephone Encounter (Signed)
Christian Bowman denies any redness, swelling, drainage from ICD site or any "disturbance" with the incision @ ICD pocket. I advised that he may be experiencing nerve pain from the device. They know to call the [facility] if any of the aforementioned symptoms occur.

## 2017-05-09 NOTE — Telephone Encounter (Signed)
Christian Bowman is scheduled for an ablation on Friday. His normal pill times are 8a.m. and 8 p.m. The letter says to take his morning meds except for metformin and Lasix. Will 4:30 a.m. be too early to take the 8 a.m. medicine?    Also, I wanted to know if the last scheduled remote defib. check showed any abnormalities from that morning. Christian Bowman had two of the problems that he has had in the past with the fast drop in blood pressure when he stands.   Christian Bowman he had balance problems that have continued today to a lesser degree. He was extremely tired yesterday.  Last, when he lays down on the side with the pacemaker, he says it feels like wires are sticking him. This is a recent development ( within the last week). Is that unusual?      Thank you,    Grace Blight

## 2017-05-09 NOTE — Telephone Encounter (Addendum)
Medications: Wife advised that patient does not need to take his meds at 4:30 am the day of his cath.    Wife c/o a fast drop in BP when standing but says this is not a new problem. BP Readings: 115/? & 80's/50's which is unchanged from previous readings.  Per wife, patient does not change positions slowly. Nurse advised wife that patient should be changing positions slowly to help with symptoms related to this.    Remote device check results have been reviewed by the device clinic and are stable.   When lying in bed on side of device patient c/o feeling wires sticking him. Nurse advised wife that he shouldn't feel anything sticking him but this message would be sent to his provider for advice.   Wife said that patient is feeling so much worse than before and is sleeping on the floor for comfort because he doesn't feel the wires sticking him.

## 2017-05-11 ENCOUNTER — Ambulatory Visit (HOSPITAL_COMMUNITY): Payer: Medicare Other | Admitting: Certified Registered"

## 2017-05-11 ENCOUNTER — Encounter (HOSPITAL_COMMUNITY): Payer: Self-pay | Admitting: Certified Registered"

## 2017-05-11 ENCOUNTER — Encounter (HOSPITAL_COMMUNITY)
Admission: RE | Disposition: A | Discharge status: Home / Self Care | Payer: Self-pay | Source: Ambulatory Visit | Attending: Cardiology

## 2017-05-11 ENCOUNTER — Ambulatory Visit (HOSPITAL_COMMUNITY)
Admission: RE | Admit: 2017-05-11 | Discharge: 2017-05-11 | Disposition: A | Discharge status: Home / Self Care | Payer: Medicare Other | Source: Ambulatory Visit | Attending: Cardiology | Admitting: Cardiology

## 2017-05-11 DIAGNOSIS — I4891 Unspecified atrial fibrillation: Secondary | ICD-10-CM | POA: Insufficient documentation

## 2017-05-11 DIAGNOSIS — I25810 Atherosclerosis of coronary artery bypass graft(s) without angina pectoris: Secondary | ICD-10-CM | POA: Diagnosis not present

## 2017-05-11 DIAGNOSIS — I4892 Unspecified atrial flutter: Secondary | ICD-10-CM | POA: Diagnosis not present

## 2017-05-11 DIAGNOSIS — E119 Type 2 diabetes mellitus without complications: Secondary | ICD-10-CM | POA: Diagnosis not present

## 2017-05-11 DIAGNOSIS — I5042 Chronic combined systolic (congestive) and diastolic (congestive) heart failure: Secondary | ICD-10-CM | POA: Diagnosis not present

## 2017-05-11 DIAGNOSIS — I110 Hypertensive heart disease with heart failure: Secondary | ICD-10-CM | POA: Diagnosis not present

## 2017-05-11 HISTORY — PX: A-FLUTTER ABLATION: EP1230

## 2017-05-11 LAB — SURGICAL PCR SCREEN
MRSA, PCR: NEGATIVE
Staphylococcus aureus: NEGATIVE

## 2017-05-11 LAB — GLUCOSE, CAPILLARY
GLUCOSE-CAPILLARY: 126 mg/dL — AB (ref 65–99)
GLUCOSE-CAPILLARY: 103 mg/dL — AB (ref 65–99)

## 2017-05-11 LAB — PROTIME-INR
INR: 2.57
PROTHROMBIN TIME: 28 s — AB (ref 11.4–15.2)

## 2017-05-11 SURGERY — A-FLUTTER ABLATION
Anesthesia: Monitor Anesthesia Care

## 2017-05-11 MED ORDER — MIDAZOLAM HCL 2 MG/2ML IJ SOLN
INTRAMUSCULAR | Status: DC | PRN
  Administered 2017-05-11: 1 mg via INTRAVENOUS

## 2017-05-11 MED ORDER — AMIODARONE HCL 200 MG PO TABS: 200.0000 mg | ORAL_TABLET | Freq: Every day | ORAL | Status: DC

## 2017-05-11 MED ORDER — ROSUVASTATIN CALCIUM 10 MG PO TABS: 10.0000 mg | ORAL_TABLET | Freq: Every day | ORAL | Status: DC

## 2017-05-11 MED ORDER — INSULIN ASPART 100 UNIT/ML ~~LOC~~ SOLN: 0.0000 [IU] | SUBCUTANEOUS | Status: DC

## 2017-05-11 MED ORDER — FUROSEMIDE 20 MG PO TABS: 20.0000 mg | ORAL_TABLET | ORAL | Status: DC

## 2017-05-11 MED ORDER — PROPOFOL 500 MG/50ML IV EMUL
INTRAVENOUS | Status: DC | PRN
  Administered 2017-05-11: 50 ug/kg/min via INTRAVENOUS

## 2017-05-11 MED ORDER — SODIUM CHLORIDE 0.9 % IV SOLN
INTRAVENOUS | Status: DC | PRN
  Administered 2017-05-11: 08:00:00 via INTRAVENOUS

## 2017-05-11 MED ORDER — CEFAZOLIN SODIUM-DEXTROSE 2-4 GM/100ML-% IV SOLN
INTRAVENOUS | Status: AC
  Filled 2017-05-11: qty 100

## 2017-05-11 MED ORDER — SPIRONOLACTONE 12.5 MG HALF TABLET: 12.5000 mg | ORAL_TABLET | Freq: Every day | ORAL | Status: DC

## 2017-05-11 MED ORDER — SODIUM CHLORIDE 0.9% FLUSH: 3.0000 mL | INTRAVENOUS | Status: DC | PRN

## 2017-05-11 MED ORDER — FENTANYL CITRATE (PF) 100 MCG/2ML IJ SOLN
INTRAMUSCULAR | Status: DC | PRN
Start: 2017-05-11 — End: 2017-05-11
  Administered 2017-05-11: 50 ug via INTRAVENOUS
  Administered 2017-05-11: 25 ug via INTRAVENOUS

## 2017-05-11 MED ORDER — CEFAZOLIN SODIUM-DEXTROSE 2-3 GM-% IV SOLR
INTRAVENOUS | Status: DC | PRN
  Administered 2017-05-11: 2 g via INTRAVENOUS

## 2017-05-11 MED ORDER — ONDANSETRON HCL 4 MG/2ML IJ SOLN
INTRAMUSCULAR | Status: DC | PRN
  Administered 2017-05-11: 4 mg via INTRAVENOUS

## 2017-05-11 MED ORDER — SODIUM CHLORIDE 0.9% FLUSH: 3.0000 mL | Freq: Two times a day (BID) | INTRAVENOUS | Status: DC

## 2017-05-11 MED ORDER — HEPARIN (PORCINE) IN NACL 2-0.9 UNIT/ML-% IJ SOLN
INTRAMUSCULAR | Status: AC | PRN
  Administered 2017-05-11: 1000 mL

## 2017-05-11 MED ORDER — LEVOTHYROXINE SODIUM 50 MCG PO TABS: 50.0000 ug | ORAL_TABLET | Freq: Every day | ORAL | Status: DC

## 2017-05-11 MED ORDER — BUPIVACAINE HCL (PF) 0.25 % IJ SOLN
INTRAMUSCULAR | Status: DC | PRN
  Administered 2017-05-11: 20 mL

## 2017-05-11 MED ORDER — CARVEDILOL 6.25 MG PO TABS: 6.2500 mg | ORAL_TABLET | Freq: Two times a day (BID) | ORAL | Status: DC

## 2017-05-11 MED ORDER — DIGOXIN 0.0625 MG HALF TABLET: 0.0625 mg | ORAL_TABLET | Freq: Every day | ORAL | Status: DC

## 2017-05-11 MED ORDER — SODIUM CHLORIDE 0.9 % IV SOLN: 250.0000 mL | INTRAVENOUS | Status: DC | PRN

## 2017-05-11 MED ORDER — WARFARIN SODIUM 5 MG PO TABS: 5.0000 mg | ORAL_TABLET | Freq: Once | ORAL | Status: DC

## 2017-05-11 MED ORDER — LIDOCAINE HCL (PF) 1 % IJ SOLN
INTRAMUSCULAR | Status: AC
  Filled 2017-05-11: qty 30

## 2017-05-11 MED ORDER — SACUBITRIL-VALSARTAN 49-51 MG PO TABS: 1.0000 | ORAL_TABLET | Freq: Two times a day (BID) | ORAL | Status: DC

## 2017-05-11 MED ORDER — METFORMIN HCL 500 MG PO TABS: 500.0000 mg | ORAL_TABLET | Freq: Two times a day (BID) | ORAL | Status: DC

## 2017-05-11 MED ORDER — ACETAMINOPHEN 325 MG PO TABS
650.0000 mg | ORAL_TABLET | ORAL | Status: DC | PRN
Start: 2017-05-11 — End: 2017-05-11

## 2017-05-11 MED ORDER — ONDANSETRON HCL 4 MG/2ML IJ SOLN: 4.0000 mg | Freq: Four times a day (QID) | INTRAMUSCULAR | Status: DC | PRN

## 2017-05-11 MED ORDER — PHENYLEPHRINE HCL 10 MG/ML IJ SOLN
INTRAMUSCULAR | Status: DC | PRN
  Administered 2017-05-11: 20 ug/min via INTRAVENOUS

## 2017-05-11 MED ORDER — HEPARIN (PORCINE) IN NACL 2-0.9 UNIT/ML-% IJ SOLN
INTRAMUSCULAR | Status: AC
  Filled 2017-05-11: qty 500

## 2017-05-11 MED ORDER — POLYETHYLENE GLYCOL 3350 17 G PO PACK: 17.0000 g | PACK | Freq: Every day | ORAL | Status: DC | PRN

## 2017-05-11 MED ORDER — ASPIRIN EC 81 MG PO TBEC: 81.0000 mg | DELAYED_RELEASE_TABLET | Freq: Every day | ORAL | Status: DC

## 2017-05-11 MED ORDER — NITROGLYCERIN 0.4 MG SL SUBL: 0.4000 mg | SUBLINGUAL_TABLET | SUBLINGUAL | Status: DC | PRN

## 2017-05-11 SURGICAL SUPPLY — 15 items
BAG SNAP BAND KOVER 36X36 (MISCELLANEOUS) ×2 IMPLANT
BLANKET WARM UNDERBOD FULL ACC (MISCELLANEOUS) ×4 IMPLANT
CATH DUODECA HALO/ISMUS 7FR (CATHETERS) ×1 IMPLANT
CATH EZ STEER NAV 8MM D-F CUR (ABLATOR) ×1 IMPLANT
CATH JOSEPHSON QUAD-ALLRED 6FR (CATHETERS) ×1 IMPLANT
PACK EP LATEX FREE (CUSTOM PROCEDURE TRAY) ×2
PACK EP LF (CUSTOM PROCEDURE TRAY) ×1 IMPLANT
PAD DEFIB LIFELINK (PAD) ×2 IMPLANT
PATCH CARTO3 (PAD) ×2 IMPLANT
SHEATH ATRIAL FLUTTER SAFL 8F (SHEATH) ×1 IMPLANT
SHEATH PINNACLE 6F 10CM (SHEATH) ×2 IMPLANT
SHEATH PINNACLE 7F 10CM (SHEATH) ×1 IMPLANT
SHEATH PINNACLE 8F 10CM (SHEATH) ×1 IMPLANT
SHEATH PINNACLE 9F 10CM (SHEATH) ×1 IMPLANT
SHEATH SWARTZ RAMP 8.5F 60CM (SHEATH) ×2 IMPLANT

## 2017-05-11 NOTE — Progress Notes (Addendum)
Site area: rt groin 3 fv sheaths Site Prior to Removal:  Level 0 Pressure Applied For: 25 minutes Manual:   yes Patient Status During Pull:  stable Post Pull Site:  Level 0 Post Pull Instructions Given:  yes Post Pull Pulses Present: palpable Dressing Applied:  Gauze and tegaderm Bedrest begins @ 8727 Comments: IV saline locked

## 2017-05-11 NOTE — Discharge Instructions (Signed)
No driving for 4 days. No lifting over 5 lbs for 1 week. No sexual activity for 1 week. You may return to work in 1 week. Keep procedure site clean & dry. If you notice increased pain, swelling, bleeding or pus, call/return!  You may shower, but no soaking baths/hot tubs/pools for 1 week.    Endovenous Ablation, Care After Refer to this sheet in the next few weeks. These instructions provide you with information about caring for yourself after your procedure. Your health care provider may also give you more specific instructions. Your treatment has been planned according to current medical practices, but problems sometimes occur. Call your health care provider if you have any problems or questions after your procedure. What can I expect after the procedure? After the procedure, it is common to have:  Bruising.  Tenderness.  Follow these instructions at home:  Incision care  Follow instructions from your health care provider about how to take care of your incision. Make sure you: ? Wash your hands with soap and water before you change your bandage (dressing). If soap and water are not available, use hand sanitizer. ? Change your dressing as told by your health care provider. ? Follow instructions from your health care provider about when you should remove your dressing.  Check your incision area every day for signs of infection. Watch for: ? Redness, swelling, or pain. ? Fluid, blood, or pus.  Keep the bandage (dressing) dry until your health care provider says it can be removed. Activity   Walk regularly as told by your health care provider. This helps with healing and helps to prevent blood clots from forming.  Avoid strenuous exercise as told by your health care provider. General instructions  Take over-the-counter and prescription medicines only as told by your health care provider.  Do not take long car trips or travel by air until your health care provider has  approved.  Wear compression stockings as told by your health care provider. These stockings help to prevent blood clots and reduce swelling in your legs.  Keep all follow-up visits as told by your health care provider. This is important. Contact a health care provider if:  You have a fever.  You have redness, swelling, or pain at the site of your incision.  You have fluid, blood, or pus coming from your incision. Get help right away if:  You notice red streaks coming from the incision.  You develop nausea or vomiting.  You have trouble breathing.  You develop chest pain. This information is not intended to replace advice given to you by your health care provider. Make sure you discuss any questions you have with your health care provider. Document Released: 09/07/2011 Document Revised: 02/24/2016 Document Reviewed: 12/22/2014 Elsevier Interactive Patient Education  2018 [employer] After This sheet gives you information about how to care for yourself after your procedure. Your health care provider may also give you more specific instructions. If you have problems or questions, contact your health care provider. What can I expect after the procedure? After the procedure, it is common to have bruising and tenderness at the catheter insertion area. Follow these instructions at home: Insertion site care  Follow instructions from your health care provider about how to take care of your insertion site. Make sure you: ? Wash your hands with soap and water before you change your bandage (dressing). If soap and water are not available, use hand sanitizer. ? Change your dressing as  told by your health care provider. ? Leave stitches (sutures), skin glue, or adhesive strips in place. These skin closures may need to stay in place for 2 weeks or longer. If adhesive strip edges start to loosen and curl up, you may trim the loose edges. Do not remove adhesive strips completely  unless your health care provider tells you to do that.  Do not take baths, swim, or use a hot tub until your health care provider approves.  You may shower 24-48 hours after the procedure or as told by your health care provider. ? Gently wash the site with plain soap and water. ? Pat the area dry with a clean towel. ? Do not rub the site. This may cause bleeding.  Do not apply powder or lotion to the site. Keep the site clean and dry.  Check your insertion site every day for signs of infection. Check for: ? Redness, swelling, or pain. ? Fluid or blood. ? Warmth. ? Pus or a bad smell. Activity  Rest as told by your health care provider, usually for 1-2 days.  Do not lift anything that is heavier than 10 lbs. (4.5 kg) or as told by your health care provider.  Do not drive for 24 hours if you were given a medicine to help you relax (sedative).  Do not drive or use heavy machinery while taking prescription pain medicine. General instructions  Return to your normal activities as told by your health care provider, usually in about a week. Ask your health care provider what activities are safe for you.  If the catheter site starts bleeding, lie flat and put pressure on the site. If the bleeding does not stop, get help right away. This is a medical emergency.  Drink enough fluid to keep your urine clear or pale yellow. This helps flush the contrast dye from your body.  Take over-the-counter and prescription medicines only as told by your health care provider.  Keep all follow-up visits as told by your health care provider. This is important. Contact a health care provider if:  You have a fever or chills.  You have redness, swelling, or pain around your insertion site.  You have fluid or blood coming from your insertion site.  The insertion site feels warm to the touch.  You have pus or a bad smell coming from your insertion site.  You have bruising around the insertion  site.  You notice blood collecting in the tissue around the catheter site (hematoma). The hematoma may be painful to the touch. Get help right away if:  You have severe pain at the catheter insertion area.  The catheter insertion area swells very fast.  The catheter insertion area is bleeding, and the bleeding does not stop when you hold steady pressure on the area.  The area near or just beyond the catheter insertion site becomes pale, cool, tingly, or numb. These symptoms may represent a serious problem that is an emergency. Do not wait to see if the symptoms will go away. Get medical help right away. Call your local emergency services (911 in the [place] Do not drive yourself to the hospital. Summary  After the procedure, it is common to have bruising and tenderness at the catheter insertion area.  After the procedure, it is important to rest and drink plenty of fluids.  Do not take baths, swim, or use a hot tub until your health care provider says it is okay to do so. You may shower 24-48  hours after the procedure or as told by your health care provider.  If the catheter site starts bleeding, lie flat and put pressure on the site. If the bleeding does not stop, get help right away. This is a medical emergency. This information is not intended to replace advice given to you by your health care provider. Make sure you discuss any questions you have with your health care provider. Document Released: 04/06/2005 Document Revised: 08/23/2016 Document Reviewed: 08/23/2016 Elsevier Interactive Patient Education  2017 [employer].

## 2017-05-11 NOTE — Anesthesia Preprocedure Evaluation (Addendum)
Anesthesia Evaluation  Patient identified by MRN, date of birth, ID band Patient awake    Reviewed: Allergy & Precautions, NPO status   Airway Mallampati: II  TM Distance: >3 FB Neck ROM: Full    Dental  (+) Teeth Intact, Dental Advisory Given   Pulmonary shortness of breath,    breath sounds clear to auscultation       Cardiovascular hypertension, + CAD, + Past MI, + Cardiac Stents, + CABG, +CHF and + DOE   Rhythm:Regular Rate:Normal     Neuro/Psych    GI/Hepatic Neg liver ROS,   Endo/Other  diabetes  Renal/GU Renal InsufficiencyRenal disease     Musculoskeletal   Abdominal   Peds  Hematology   Anesthesia Other Findings   Reproductive/Obstetrics                           Anesthesia Physical Anesthesia Plan  ASA: IV  Anesthesia Plan: MAC   Post-op Pain Management:  Regional for Post-op pain   Induction: Intravenous  PONV Risk Score and Plan: 2 and Ondansetron and Dexamethasone  Airway Management Planned: Natural Airway and Simple Face Mask  Additional Equipment:   Intra-op Plan:   Post-operative Plan:   Informed Consent: I have reviewed the patients History and Physical, chart, labs and discussed the procedure including the risks, benefits and alternatives for the proposed anesthesia with the patient or authorized representative who has indicated his/her understanding and acceptance.   Dental advisory given  Plan Discussed with: CRNA and Surgeon  Anesthesia Plan Comments:        Anesthesia Quick Evaluation

## 2017-05-11 NOTE — H&P (Signed)
Christian Bowman is a 71 y.o. male with a history of atrial flutter. On ECGs, atrial flutter appears to be typical. He presents today for ablation of his flutter. He is on coumadin with a therapeutic INR. On exam, RRR, no murmurs, lungs clear. Risks and benefits of the procedure were discussed. Risks include but not limited to bleeding, infection, tamponade, heart block, stroke. The patient understands the risks and has agreed to the procedure.  Artemis Koller Curt Bears, MD 05/11/2017 7:08 AM

## 2017-05-11 NOTE — Anesthesia Procedure Notes (Signed)
Procedure Name: MAC Date/Time: 05/11/2017 7:46 AM Performed by: Barrington Ellison Pre-anesthesia Checklist: Patient identified, Emergency Drugs available, Suction available and Patient being monitored Patient Re-evaluated:Patient Re-evaluated prior to induction Oxygen Delivery Method: Simple face mask

## 2017-05-11 NOTE — Anesthesia Postprocedure Evaluation (Signed)
Anesthesia Post Note  Patient: Christian Bowman  Procedure(s) Performed: Procedure(s) (LRB): A-Flutter Ablation (N/A)     Patient location during evaluation: Cath Lab Anesthesia Type: MAC Level of consciousness: awake and alert Pain management: pain level controlled Vital Signs Assessment: post-procedure vital signs reviewed and stable Respiratory status: spontaneous breathing, nonlabored ventilation, respiratory function stable and patient connected to nasal cannula oxygen Cardiovascular status: stable and blood pressure returned to baseline Anesthetic complications: no    Last Vitals:  Vitals:   05/11/17 1045 05/11/17 1100  BP: (!) 111/58 109/62  Pulse: (!) 59 (!) 59  Resp: 14 11  Temp:    SpO2: 97% 97%    Last Pain:  Vitals:   05/11/17 1012  TempSrc: Oral                 Eevee Borbon,JAMES TERRILL

## 2017-05-11 NOTE — Progress Notes (Addendum)
Cardiac monitor tech called for rhythm verification, Dr Curt Bears was called and will come to see pt to verify. 1050 Dr Curt Bears reviewed rhythm, no further assessment needed.

## 2017-05-11 NOTE — Transfer of Care (Signed)
Immediate Anesthesia Transfer of Care Note  Patient: Christian Bowman  Procedure(s) Performed: Procedure(s): A-Flutter Ablation (N/A)  Patient Location: PACU  Anesthesia Type:MAC  Level of Consciousness: awake, alert  and oriented  Airway & Oxygen Therapy: Patient Spontanous Breathing  Post-op Assessment: Report given to RN  Post vital signs: Reviewed and stable  Last Vitals:  Vitals:   05/11/17 0550 05/11/17 0924  BP: (!) 162/80   Pulse: 63   Resp: 18   Temp: (!) 36.4 C 36.6 C  SpO2: 96%     Last Pain:  Vitals:   05/11/17 0924  TempSrc: Temporal      Patients Stated Pain Goal: 3 (90/30/09 2330)  Complications: No apparent anesthesia complications

## 2017-05-14 ENCOUNTER — Encounter (HOSPITAL_COMMUNITY): Payer: Self-pay | Admitting: Cardiology

## 2017-05-14 IMAGING — CR DG CHEST 2V
2 series · 2 of 2 positions shown · non-contrast
Comparison: Prior CT from 07/07/2016.

CLINICAL DATA: Preoperative evaluation.

EXAM:
CHEST  2 VIEW

[w chest pa]
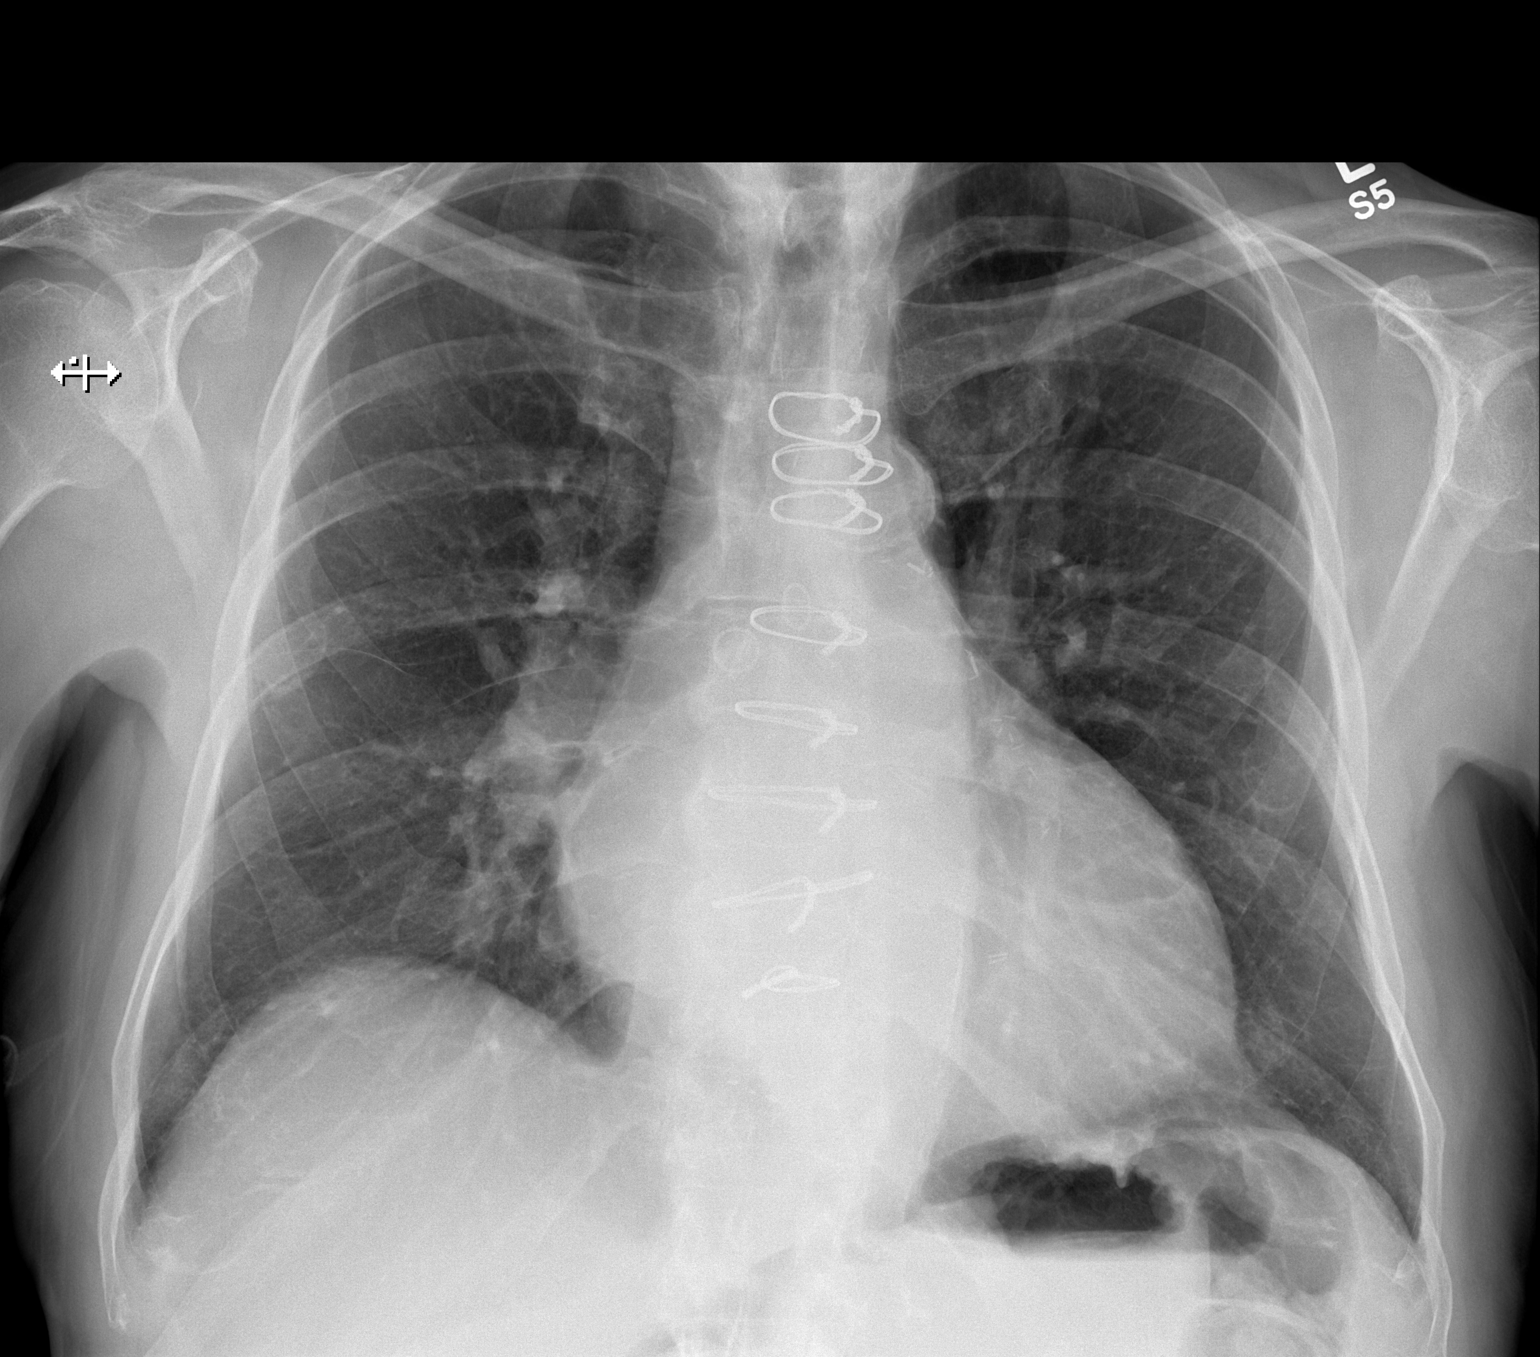

[w chest lat]
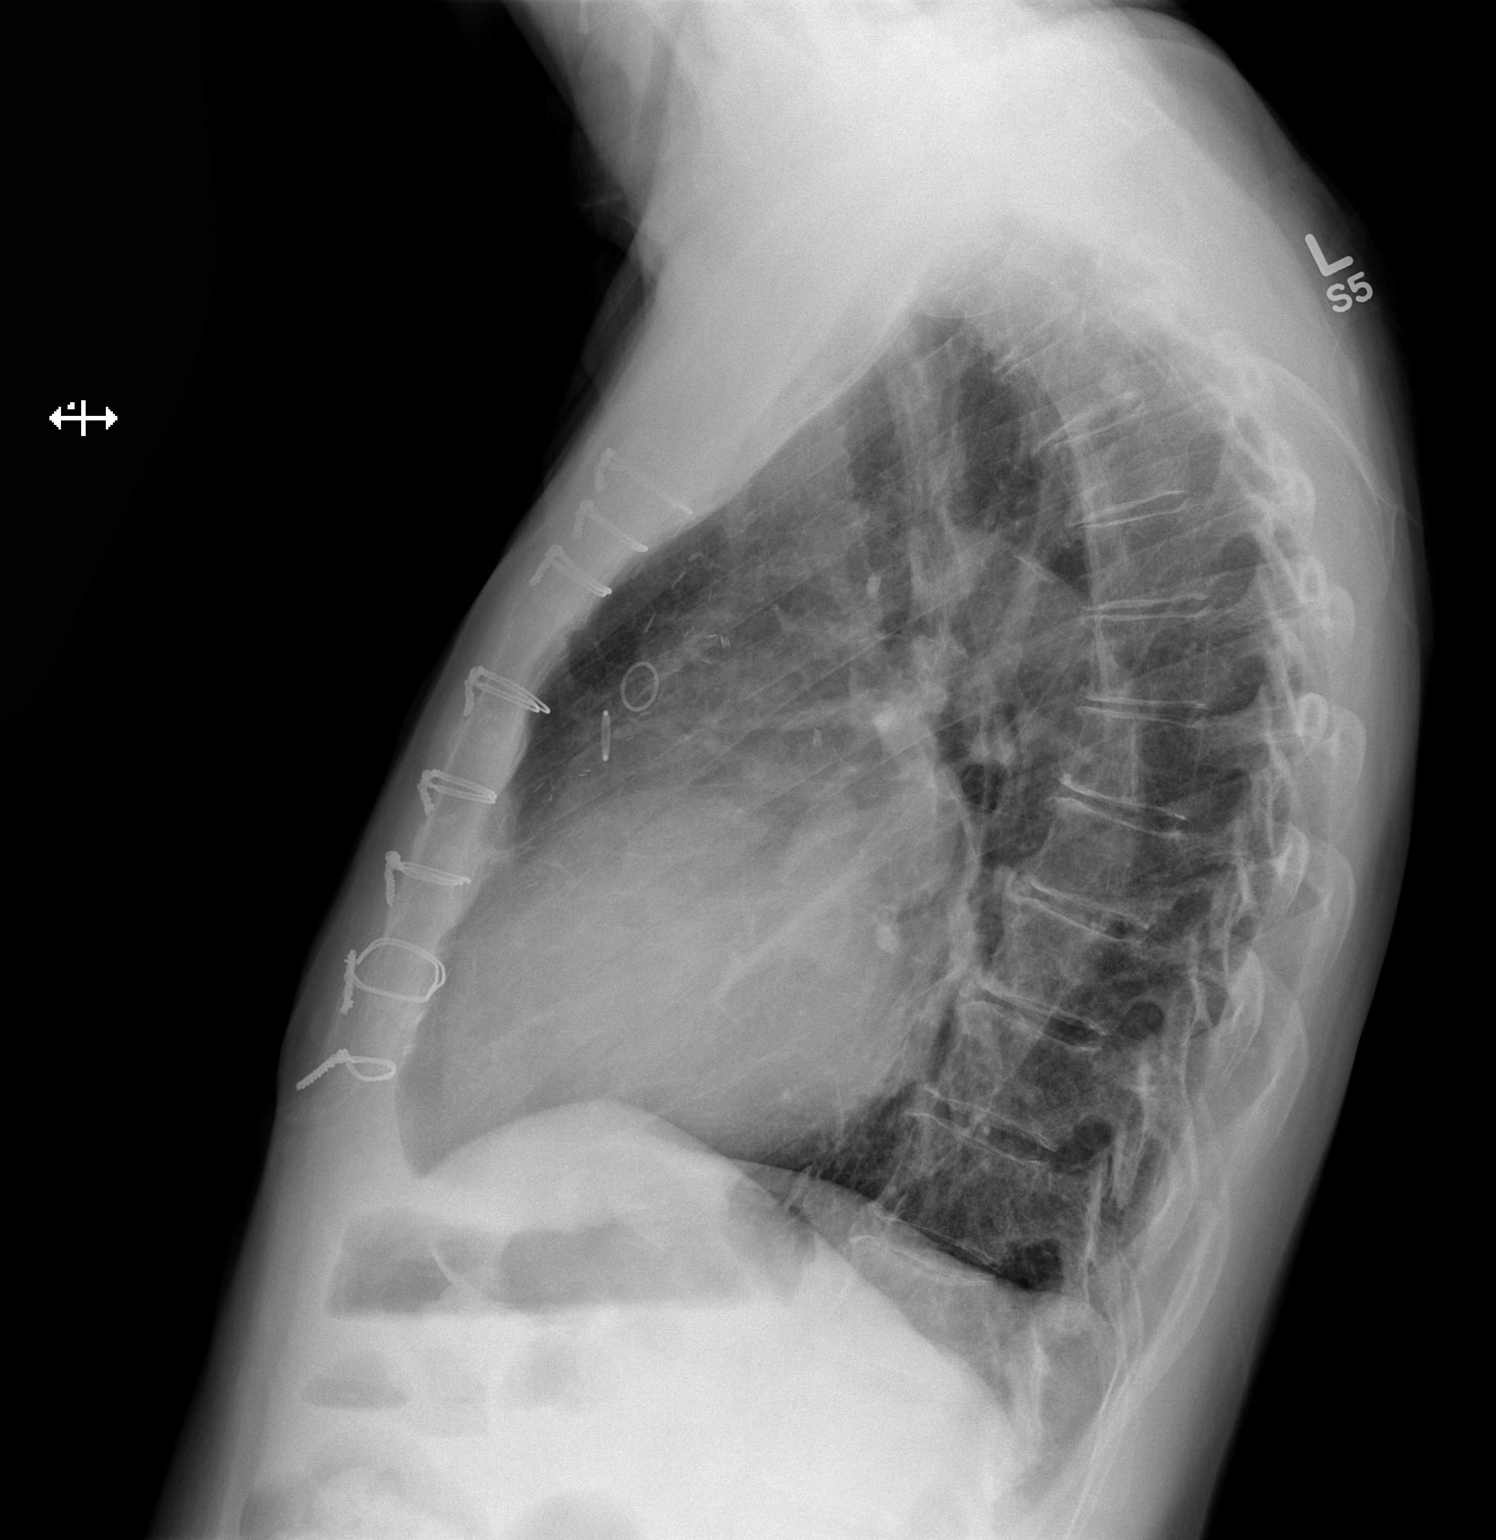

[2 of 2 positions shown; findings below may reference images not displayed]

FINDINGS: Median sternotomy wires underlying CABG markers and surgical clips
noted. Cardiomegaly is similar to previous. Mediastinal silhouette
within normal limits. Aortic atherosclerosis noted.

Lungs are normally inflated. No focal infiltrate, pulmonary edema,
or pleural effusion. No pneumothorax.

No acute osseous abnormality.
IMPRESSION: 1. No active cardiopulmonary disease.
2. Stable cardiomegaly with sequela prior CABG.
3. Aortic atherosclerosis.

## 2017-05-16 ENCOUNTER — Ambulatory Visit (INDEPENDENT_AMBULATORY_CARE_PROVIDER_SITE_OTHER): Payer: Medicare Other | Admitting: *Deleted

## 2017-05-16 DIAGNOSIS — I255 Ischemic cardiomyopathy: Secondary | ICD-10-CM

## 2017-05-16 DIAGNOSIS — Z953 Presence of xenogenic heart valve: Secondary | ICD-10-CM | POA: Diagnosis not present

## 2017-05-16 DIAGNOSIS — I483 Typical atrial flutter: Secondary | ICD-10-CM

## 2017-05-16 DIAGNOSIS — Z5181 Encounter for therapeutic drug level monitoring: Secondary | ICD-10-CM

## 2017-05-16 LAB — POCT INR: INR: 2.3

## 2017-05-18 DIAGNOSIS — Z1389 Encounter for screening for other disorder: Secondary | ICD-10-CM | POA: Diagnosis not present

## 2017-05-18 DIAGNOSIS — Z6823 Body mass index (BMI) 23.0-23.9, adult: Secondary | ICD-10-CM | POA: Diagnosis not present

## 2017-05-18 DIAGNOSIS — K4090 Unilateral inguinal hernia, without obstruction or gangrene, not specified as recurrent: Secondary | ICD-10-CM | POA: Diagnosis not present

## 2017-05-18 IMAGING — CR DG CHEST 1V PORT
1 series · 1 of 1 positions shown · non-contrast
Comparison: 07/07/2016

CLINICAL DATA: Atelectasis.

EXAM:
PORTABLE CHEST 1 VIEW

[AP]
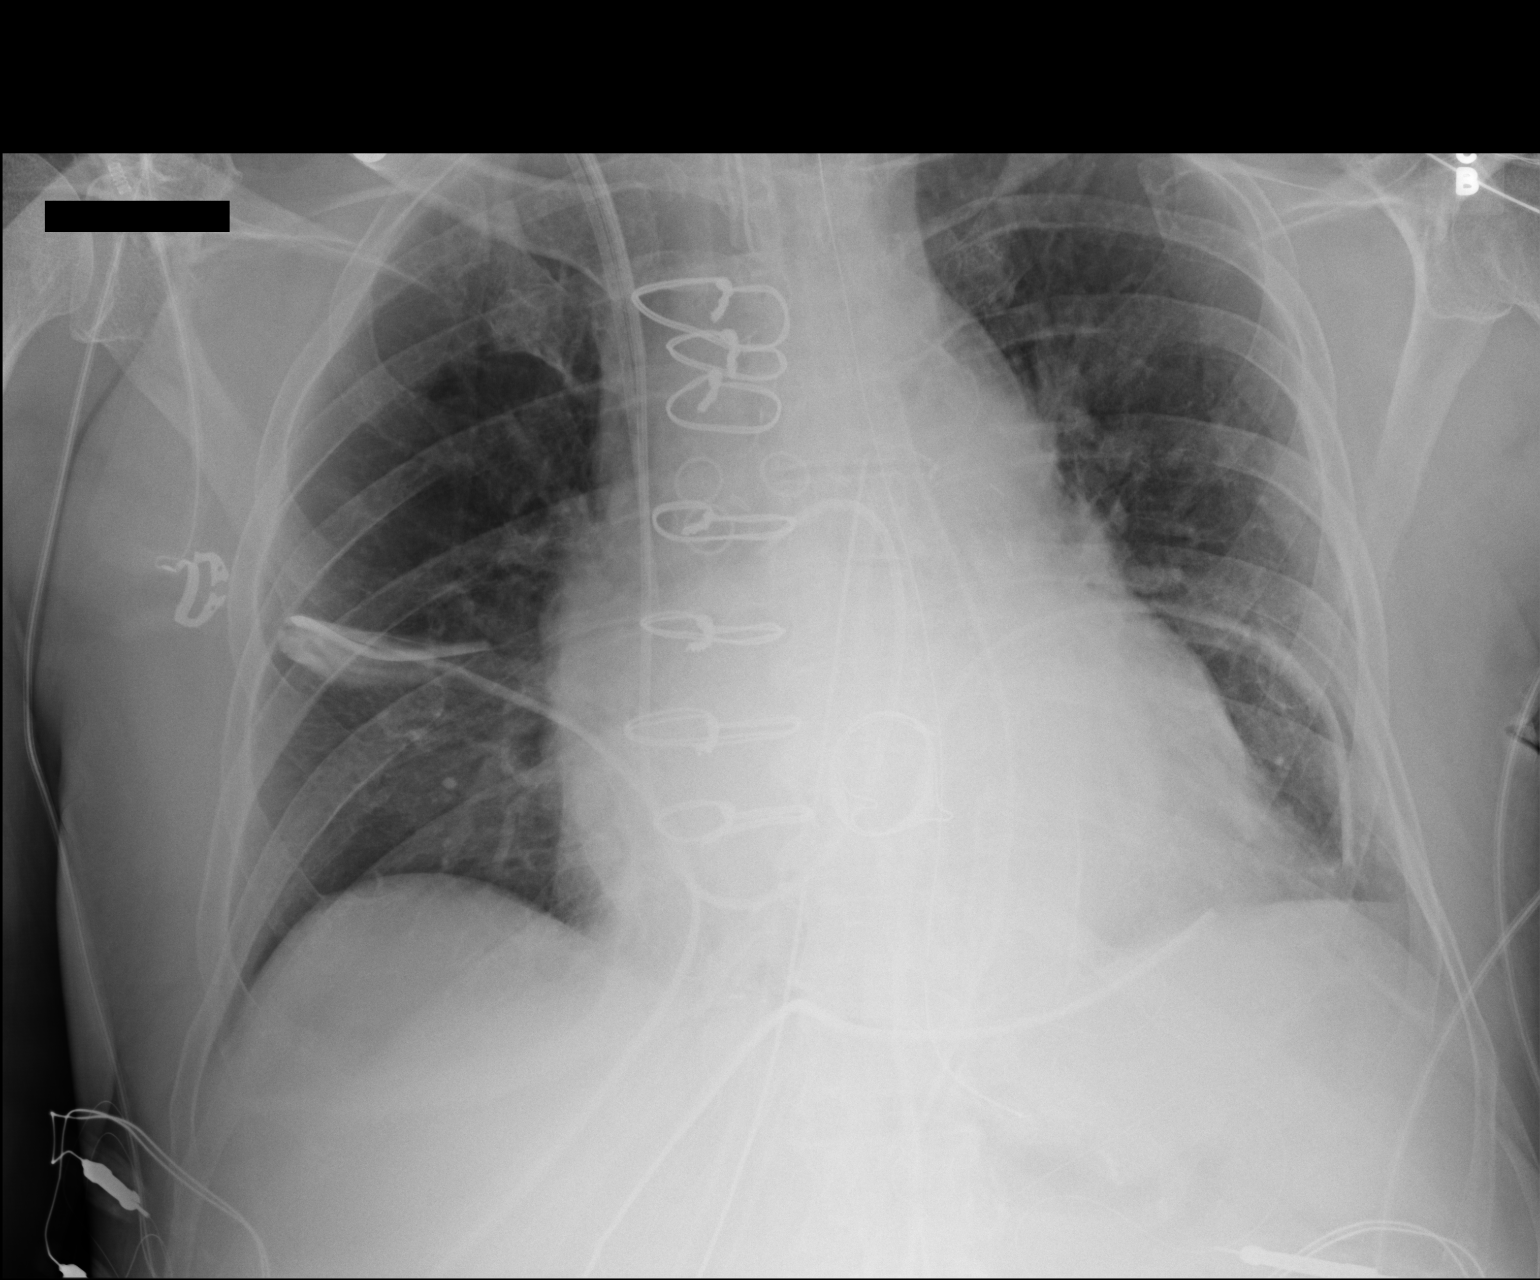

[1 of 1 positions shown; findings below may reference images not displayed]

FINDINGS: Endotracheal tube in good position. NG tube in the proximal stomach.
Pericardial drain and mediastinal drain. Right chest tube.

Right jugular central venous catheter tip in the SVC. Right
Swan-Ganz catheter tip in the right pulmonary artery.

Mitral valve replacement. CABG changes. Cardiac enlargement. No
heart failure. No significant effusion. Lungs are clear.
IMPRESSION: Support lines in satisfactory position. No acute abnormality.
Negative for heart failure or effusion.

## 2017-05-23 ENCOUNTER — Encounter: Payer: Self-pay | Admitting: Cardiology

## 2017-05-23 ENCOUNTER — Ambulatory Visit (INDEPENDENT_AMBULATORY_CARE_PROVIDER_SITE_OTHER): Payer: Medicare Other | Admitting: *Deleted

## 2017-05-23 DIAGNOSIS — I483 Typical atrial flutter: Secondary | ICD-10-CM | POA: Diagnosis not present

## 2017-05-23 DIAGNOSIS — Z5181 Encounter for therapeutic drug level monitoring: Secondary | ICD-10-CM | POA: Diagnosis not present

## 2017-05-23 DIAGNOSIS — Z953 Presence of xenogenic heart valve: Secondary | ICD-10-CM

## 2017-05-23 DIAGNOSIS — I255 Ischemic cardiomyopathy: Secondary | ICD-10-CM

## 2017-05-23 LAB — POCT INR: INR: 3

## 2017-05-23 MED ORDER — WARFARIN SODIUM 5 MG PO TABS: ORAL_TABLET | ORAL | 3 refills | Status: DC

## 2017-05-23 NOTE — Addendum Note (Signed)
Addended by: Malen Gauze on: 05/23/2017 11:11 AM   Modules accepted: Orders

## 2017-05-24 ENCOUNTER — Encounter: Payer: Self-pay | Admitting: Cardiology

## 2017-05-24 ENCOUNTER — Encounter: Payer: Self-pay | Admitting: Cardiovascular Disease

## 2017-05-24 ENCOUNTER — Other Ambulatory Visit: Payer: Self-pay | Admitting: *Deleted

## 2017-05-24 MED ORDER — ROSUVASTATIN CALCIUM 10 MG PO TABS: ORAL_TABLET | ORAL | 3 refills | Status: DC

## 2017-05-25 ENCOUNTER — Encounter: Payer: Self-pay | Admitting: *Deleted

## 2017-05-27 ENCOUNTER — Encounter: Payer: Self-pay | Admitting: Cardiovascular Disease

## 2017-05-28 DIAGNOSIS — Z9861 Coronary angioplasty status: Secondary | ICD-10-CM | POA: Diagnosis not present

## 2017-05-28 DIAGNOSIS — Z951 Presence of aortocoronary bypass graft: Secondary | ICD-10-CM | POA: Diagnosis not present

## 2017-05-28 DIAGNOSIS — I252 Old myocardial infarction: Secondary | ICD-10-CM | POA: Diagnosis not present

## 2017-05-28 DIAGNOSIS — Z9889 Other specified postprocedural states: Secondary | ICD-10-CM | POA: Diagnosis not present

## 2017-05-30 DIAGNOSIS — I252 Old myocardial infarction: Secondary | ICD-10-CM | POA: Diagnosis not present

## 2017-05-30 DIAGNOSIS — Z9889 Other specified postprocedural states: Secondary | ICD-10-CM | POA: Diagnosis not present

## 2017-05-30 DIAGNOSIS — Z9861 Coronary angioplasty status: Secondary | ICD-10-CM | POA: Diagnosis not present

## 2017-05-30 DIAGNOSIS — Z951 Presence of aortocoronary bypass graft: Secondary | ICD-10-CM | POA: Diagnosis not present

## 2017-06-04 IMAGING — CR DG CHEST 2V
2 series · 2 of 2 positions shown · non-contrast
Comparison: 07/26/2016

CLINICAL DATA: Pneumothorax

EXAM:
CHEST  2 VIEW

[chest pa]
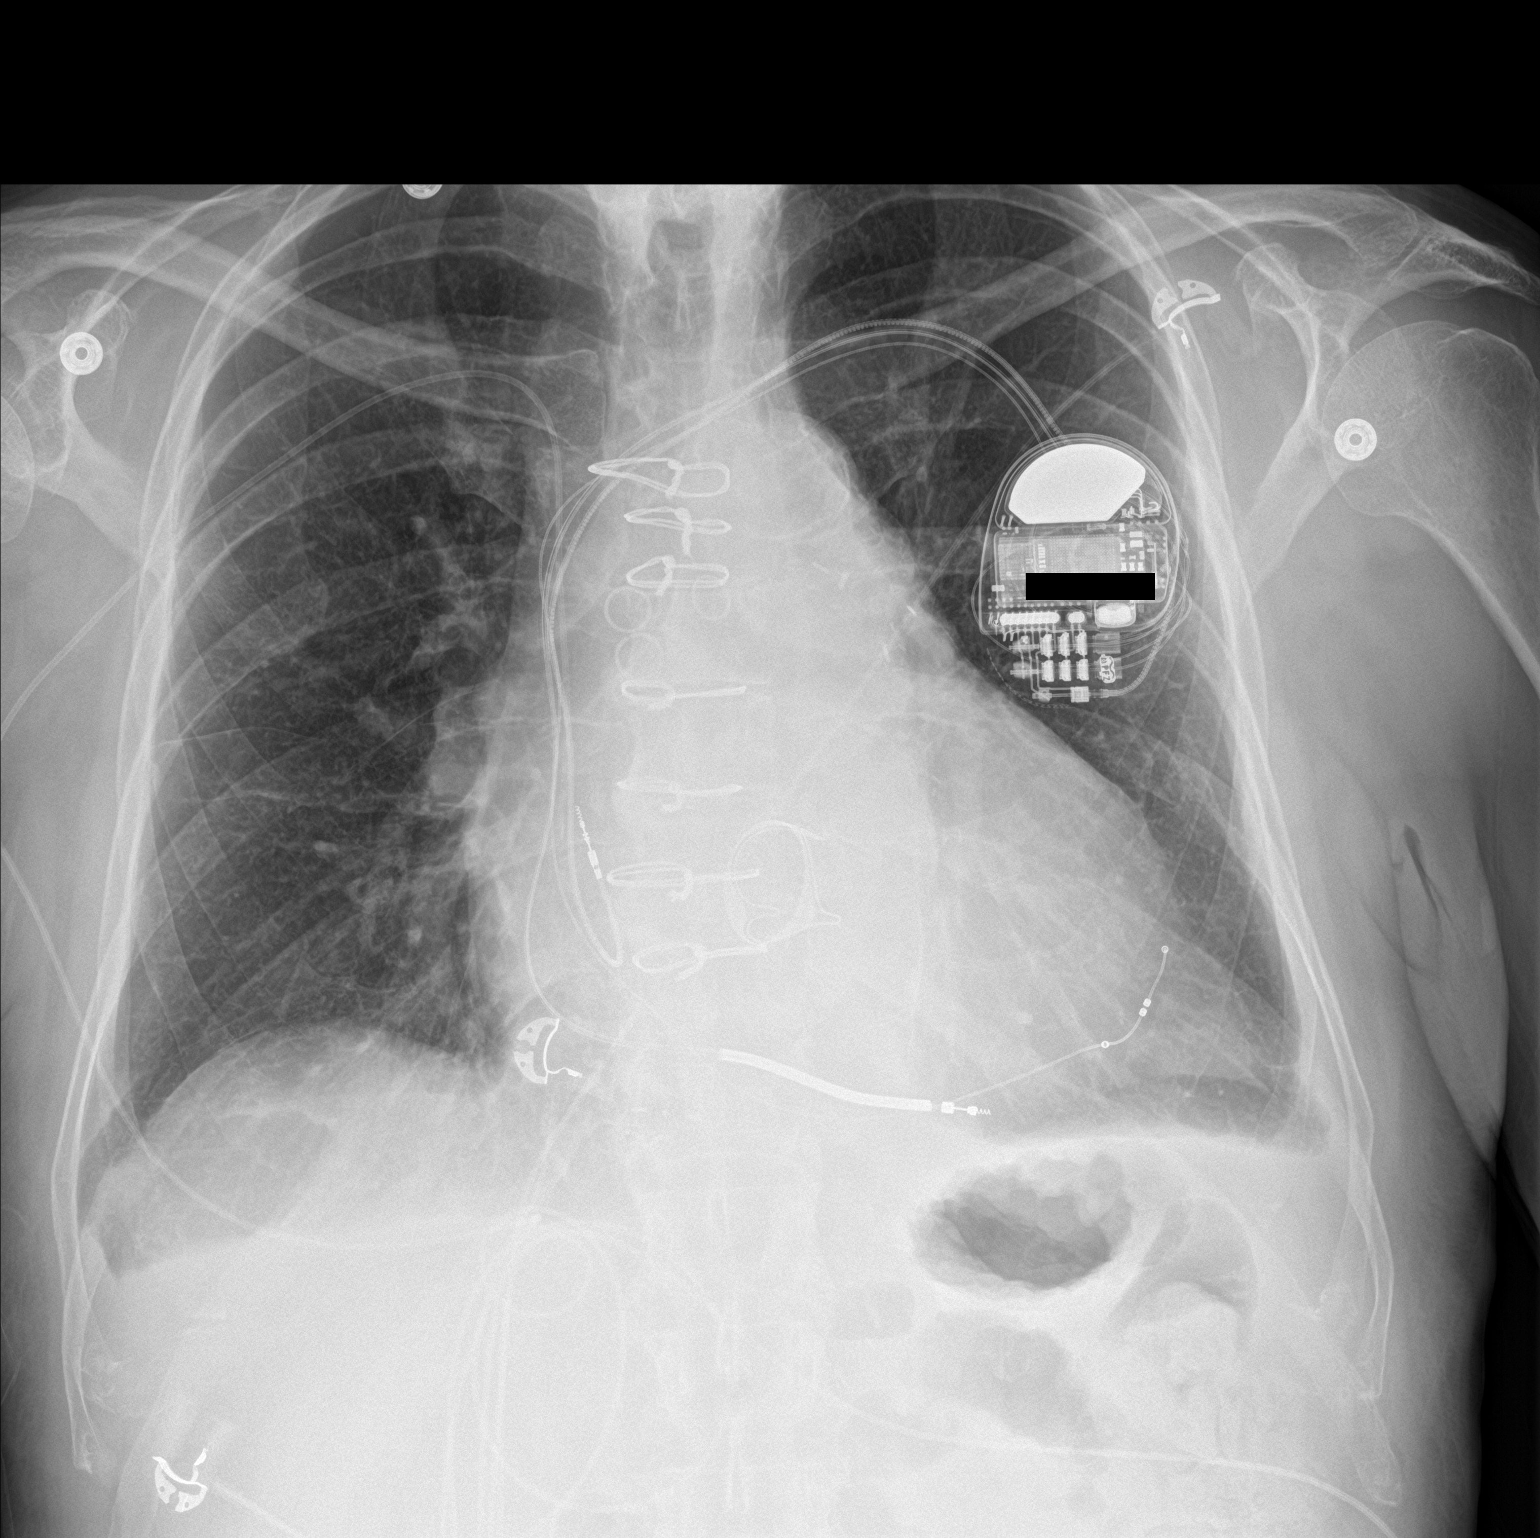

[chest lat]
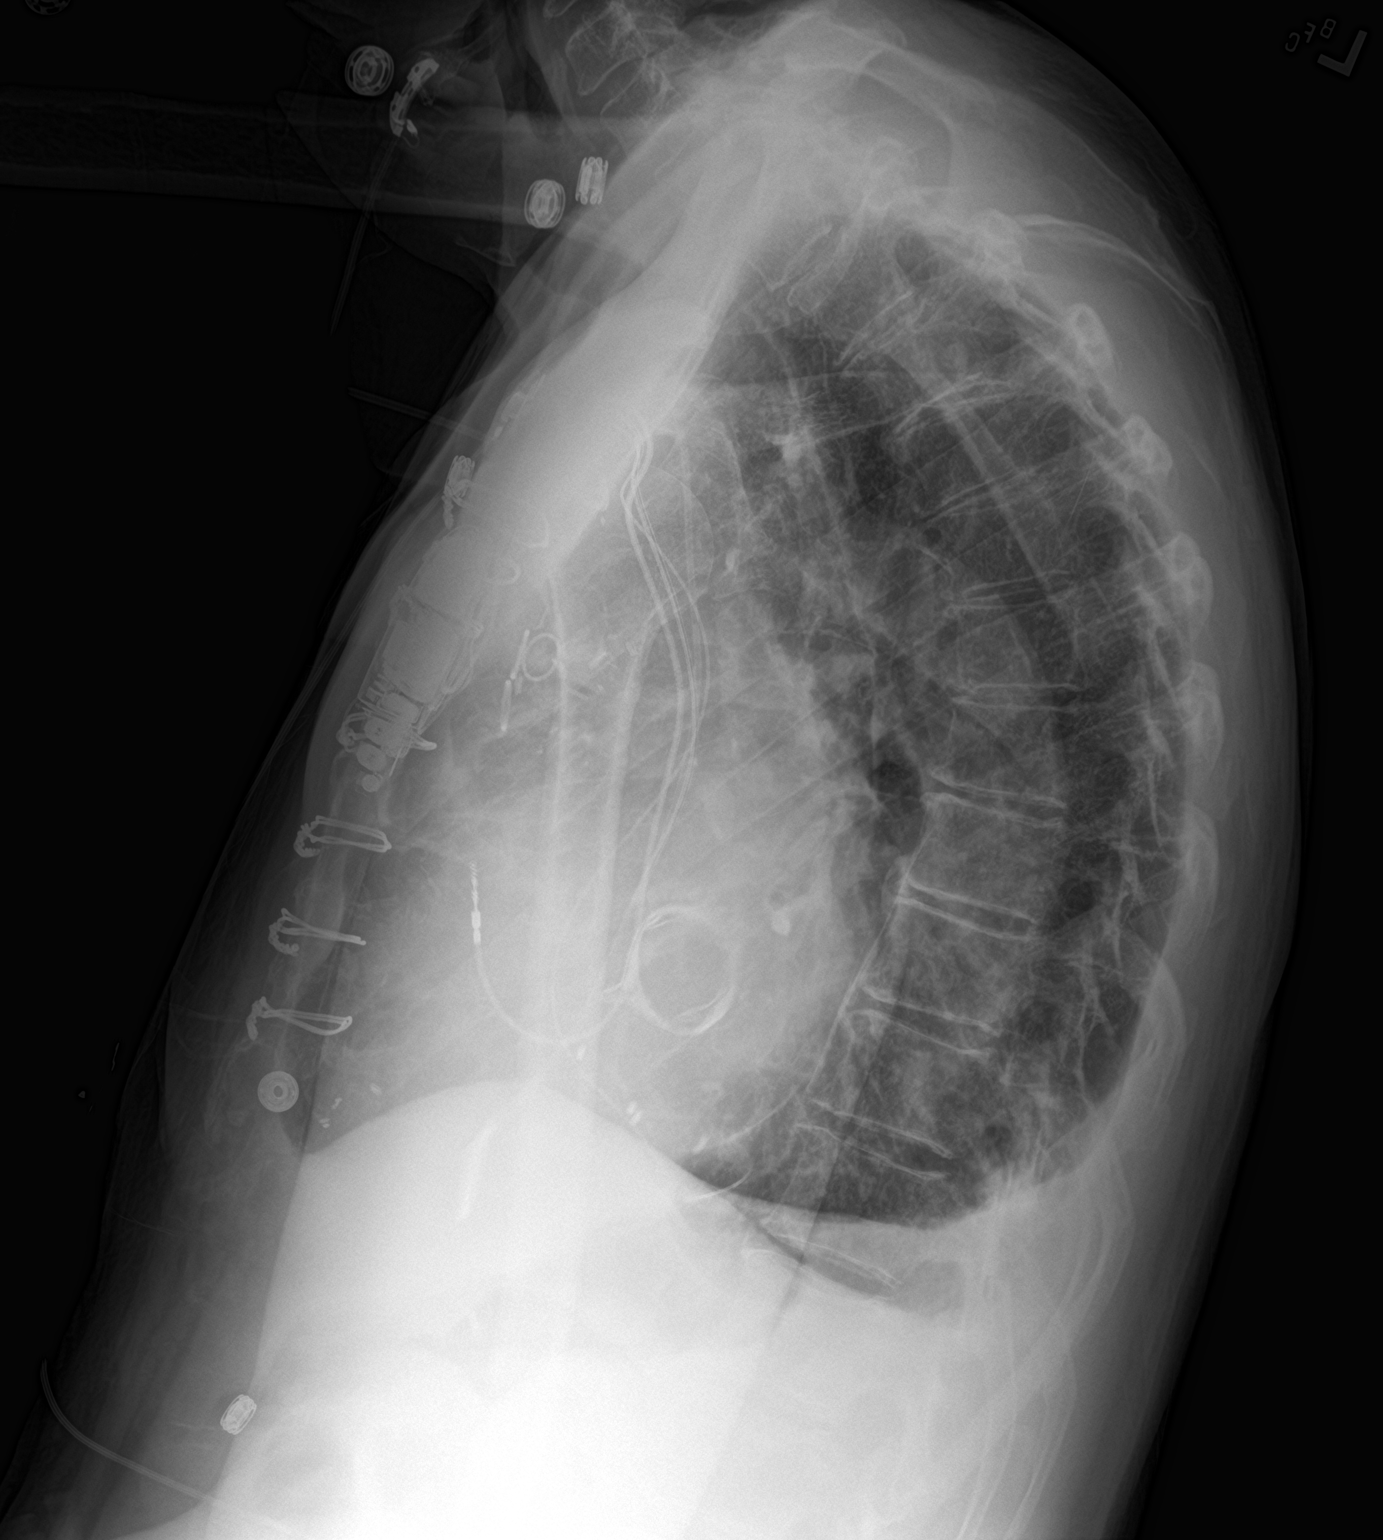

[2 of 2 positions shown; findings below may reference images not displayed]

FINDINGS: Left pacer remains in place, unchanged. No pneumothorax.
Cardiomegaly. Small bilateral pleural effusions. No confluent
opacities or effusions.
IMPRESSION: No pneumothorax.

Small bilateral pleural effusions.

## 2017-06-06 ENCOUNTER — Ambulatory Visit (INDEPENDENT_AMBULATORY_CARE_PROVIDER_SITE_OTHER): Payer: Medicare Other | Admitting: *Deleted

## 2017-06-06 DIAGNOSIS — I4892 Unspecified atrial flutter: Secondary | ICD-10-CM

## 2017-06-06 DIAGNOSIS — Z5181 Encounter for therapeutic drug level monitoring: Secondary | ICD-10-CM

## 2017-06-06 DIAGNOSIS — Z953 Presence of xenogenic heart valve: Secondary | ICD-10-CM

## 2017-06-06 DIAGNOSIS — I483 Typical atrial flutter: Secondary | ICD-10-CM

## 2017-06-06 LAB — POCT INR: INR: 3.2

## 2017-06-09 ENCOUNTER — Encounter (HOSPITAL_COMMUNITY): Payer: Self-pay | Admitting: Cardiology

## 2017-06-12 NOTE — Progress Notes (Signed)
Electrophysiology Office Note   Date:  06/13/2017   ID:  Christian Bowman, DOB 1946/01/27, MRN [id number]  PCP:  Sharilyn Sites, MD  Cardiologist:  Aundra Dubin Primary Electrophysiologist:  Easter Kennebrew Meredith Leeds, MD    Chief Complaint  Patient presents with  . Defib Check    Typical AFlutter     History of Present Illness: Christian Bowman is a 71 y.o. male who presents today for electrophysiology evaluation.   History of CAD s/p CABG in 2007 then redo CABG in 10/17 with mitral valve replacement. He was admitted in 10/17 for redo CABG with SVG-PDA and SVG-ramus.  He also had mitral valve replacement with a bioprosthetic valve because of infarct-related mitral regurgitation.  His EF on last study (10/17 TEE) was 20-25%.   Post-operative course was complicated by CHF requiring diuresis.  He also had atrial flutter and required DCCV.  Due to complete heart block, he got a CRT-D system.    Had atrial flutter ablation 05/11/17 presenting for post procedure follow up.  Today, denies symptoms of palpitations, chest pain, shortness of breath, orthopnea, PND, lower extremity edema, claudication, dizziness, presyncope, syncope, bleeding, or neurologic sequela. The patient is tolerating medications without difficulties. He has stopped his amiodarone previously without recurrence of atrial flutter. He has not noted any more palpitations. He continues to work with cardiac rehabilitation. He is continuing to exercise outside of cardiac rehabilitation as well.    Past Medical History:  Diagnosis Date  . Anginal pain ([place])   . CAD (coronary artery disease)   . Coronary artery disease involving coronary bypass graft   . Cyst of neck    right side  . DM2 (diabetes mellitus, type 2) ([place]) 08/26/2013  . Dyspnea   . Heart attack ([place])   . HTN (hypertension) 08/26/2013  . Hyperlipidemia 08/26/2013  . Left main coronary artery disease   . Left renal artery stenosis ([place])    Genesis 6x12 stent 2007  . Obesity (BMI  30.0-34.9) 08/26/2013  . Postoperative atrial fibrillation ([place]) 10/15/2005  . S/P CABG x 4 10/13/2005   LIMA to LAD, SVG to intermediate branch, sequential SVG to PDA and RPL branch, EVH via right thigh  . S/P mitral valve replacement with bioprosthetic valve 07/11/2016   31 mm [facility] Mitral bovine bioprosthetic tissue valve  . S/P redo CABG x 2 07/11/2016   SVG to PDA and SVG to Intermediate Branch, EVH via left thigh   Past Surgical History:  Procedure Laterality Date  . A-FLUTTER ABLATION N/A 05/11/2017   Procedure: A-Flutter Ablation;  Surgeon: Constance Haw, MD;  Location: [place] [facility];  Service: Cardiovascular;  Laterality: N/A;  . CARDIAC CATHETERIZATION N/A 06/21/2016   Procedure: Right/Left Heart Cath and Coronary/Graft Angiography;  Surgeon: Sherren Mocha, MD;  Location: [place] [facility];  Service: Cardiovascular;  Laterality: N/A;  . CARDIOVERSION N/A 07/19/2016   Procedure: CARDIOVERSION;  Surgeon: Lelon Perla, MD;  Location: [facility] ENDOSCOPY;  Service: Cardiovascular;  Laterality: N/A;  . CARDIOVERSION N/A 09/08/2016   Procedure: CARDIOVERSION;  Surgeon: Fay Records, MD;  [facility];  Service: Cardiovascular;  Laterality: N/A;  . CORONARY ARTERY BYPASS GRAFT  10/13/2005   LIMA to LAD, SVG to intermediate branch, sequential SVG to PDA and RPL  . CORONARY ARTERY BYPASS GRAFT N/A 07/11/2016   Procedure: REDO CORONARY ARTERY BYPASS GRAFTING (CABG) x two using left leg greater saphenous vein harvested endoscopically-SVG to PDA -SVG to RAMUS INTERMEDIATE;  Surgeon: Valentina Gu  Roxy Manns, MD;  Location: [place];  Service: Open Heart Surgery;  Laterality: N/A;  . CORONARY ARTERY BYPASS GRAFT N/A 07/11/2016   Procedure: Re-exploration (CABG) for post op bleeding,;  Surgeon: Rexene Alberts, MD;  Location: [place];  Service: Open Heart Surgery;  Laterality: N/A;  . EP IMPLANTABLE DEVICE N/A 07/25/2016   Procedure: BiV ICD Insertion CRT-D;  Surgeon: Drexel Ivey Meredith Leeds, MD;  Location: [place] [facility];  Service: Cardiovascular;  Laterality: N/A;  . LEFT HEART CATH AND CORS/GRAFTS ANGIOGRAPHY N/A 12/18/2016   Procedure: Left Heart Cath and Cors/Grafts Angiography;  Surgeon: Sherren Mocha, MD;  Location: [place] [facility];  Service: Cardiovascular;  Laterality: N/A;  . MITRAL VALVE REPLACEMENT N/A 07/11/2016   Procedure: MITRAL VALVE (MV) REPLACEMENT;  Surgeon: Rexene Alberts, MD;  Location: [place];  Service: Open Heart Surgery;  Laterality: N/A;  . MYOCARDICAL PERFUSION  10/09/2007   NORMAL PERFUSION IN ALL REGIONS;NO EVIDENCE OF INDUCIBLE ISCHEMIA;POST STRESS EF% 66  . PERCUTANEOUS CORONARY STENT INTERVENTION (PCI-S)     DES in SVG to right coronary artery system  . RENAL ARTERY STENT Right 2007  . RENAL DOPPLER  03/28/2010   RIGHT RA-NORMAL;LEFT PROXIMAL RA AT STENT-PATENT WITH NO EVIDENCE OF SIGN DIAMETER REDUCTION. R & L KIDNEYS: EQUAL IN SIZE,SYMMETRICAL IN SHAPE.  . TEE WITHOUT CARDIOVERSION N/A 06/15/2016   Procedure: TRANSESOPHAGEAL ECHOCARDIOGRAM (TEE);  Surgeon: Sanda Klein, MD;  Location: [facility] ENDOSCOPY;  Service: Cardiovascular;  Laterality: N/A;  . TEE WITHOUT CARDIOVERSION N/A 07/11/2016   Procedure: TRANSESOPHAGEAL ECHOCARDIOGRAM (TEE);  Surgeon: Rexene Alberts, MD;  Location: [place];  Service: Open Heart Surgery;  Laterality: N/A;  . TEE WITHOUT CARDIOVERSION N/A 07/19/2016   Procedure: TRANSESOPHAGEAL ECHOCARDIOGRAM (TEE);  Surgeon: Lelon Perla, MD;  Location: [facility] ENDOSCOPY;  Service: Cardiovascular;  Laterality: N/A;  . TRANSESOPHAGEAL ECHOCARDIOGRAM  10/19/2005   NORMAL LV; MILD TO MODERATE AMOUNT OF SOFT ATHEROMATOUS PLAQUE OF THE THORACIC AORTA; THE LEFT ATRIUM IS MILDLY DILATED;LEFT ATRIAL APPENDAGE FUNCTION IS NORMAL;NO THROMBUS IDENTIFIED. SMALL PFO WITH RIGHT TO LEFT SHUNT     Current Outpatient Prescriptions  Medication Sig Dispense Refill  . ACCU-CHEK SOFTCLIX LANCETS lancets Use as instructed. E11.65 100 each 2  .  amiodarone (PACERONE) 200 MG tablet Take 1 tablet (200 mg total) by mouth at bedtime. 30 tablet 3  . amoxicillin (AMOXIL) 500 MG tablet Take 4 tablets (2,000 mg total) by mouth as needed. Pre-dental work only. 4 tablet 2  . aspirin EC 81 MG tablet Take 81 mg by mouth daily.     . bisacodyl (DULCOLAX) 5 MG EC tablet Take 5 mg by mouth as needed (Constipation).    . carvedilol (COREG) 6.25 MG tablet Take 2 tablets (12.5 mg total) by mouth every morning and take 1.5 tablets (9.375 mg total) by mouth every evening. (Patient taking differently: Takes 1.5 tablets (9.375 mg) twice daily except on days pt goes into cardio rehab take 2 tablets (12.5 mg) in the morning and 1.5 tablets (9.375 mg) every evening) 105 tablet 11  . digoxin (LANOXIN) 0.125 MG tablet Take 0.5 tablets (0.0625 mg total) by mouth daily. 15 tablet 11  . furosemide (LASIX) 20 MG tablet Take 1 tablet (20 mg total) by mouth every other day. 30 tablet 11  . levothyroxine (SYNTHROID, LEVOTHROID) 50 MCG tablet Take 1 tablet (50 mcg total) by mouth daily before breakfast. 90 tablet 3  . metFORMIN (GLUCOPHAGE) 500 MG tablet Take 1 tablet (500 mg total) by mouth  2 (two) times daily with a meal. 200 tablet 1  . neomycin-bacitracin-polymyxin (NEOSPORIN) ointment Apply 1 application topically daily as needed for wound care.    . nitroGLYCERIN (NITROSTAT) 0.4 MG SL tablet Place 1 tablet (0.4 mg total) under the tongue every 5 (five) minutes x 3 doses as needed for chest pain. 30 tablet 12  . polyethylene glycol (MIRALAX / GLYCOLAX) packet Take 17 g by mouth daily as needed for mild constipation or moderate constipation.    . rosuvastatin (CRESTOR) 10 MG tablet Take 10 mgs by mouth once daily in the evening 90 tablet 3  . sacubitril-valsartan (ENTRESTO) 49-51 MG Take 1 tablet by mouth 2 (two) times daily. 180 tablet 3  . spironolactone (ALDACTONE) 25 MG tablet Take 0.5 tablets (12.5 mg total) by mouth daily. 30 tablet 6  . warfarin (COUMADIN) 5 MG  tablet Take 5mg  by mouth once daily in the evening except Tuesdays and Saturdays take 7.5mg  once daily in the evening 45 tablet 3   No current facility-administered medications for this visit.     Allergies:   Xanax [alprazolam]   Social History:  The patient  reports that he has never smoked. He has never used smokeless tobacco. He reports that he does not drink alcohol or use drugs.   Family History:  The patient's family history includes Heart attack in his father and mother; Heart disease in his father, maternal grandmother, and mother; Hypertension in his father and sister.    ROS:  Please see the history of present illness.   Otherwise, review of systems is positive for chest pressure, dyspnea on exertion, constipation, dizziness.   All other systems are reviewed and negative.   PHYSICAL EXAM: VS:  BP 128/64   Pulse 80   Ht 6' (1.829 m)   Wt 170 lb 6.4 oz (77.3 kg)   BMI 23.11 kg/m  , BMI Body mass index is 23.11 kg/m. GEN: Well nourished, well developed, in no acute distress  HEENT: normal  Neck: no JVD, carotid bruits, or masses Cardiac: RRR; no murmurs, rubs, or gallops,no edema  Respiratory:  clear to auscultation bilaterally, normal work of breathing GI: soft, nontender, nondistended, + BS MS: no deformity or atrophy  Skin: warm and dry, device site well healed Neuro:  Strength and sensation are intact Psych: euthymic mood, full affect  EKG:  EKG is ordered today. Personal review of the ekg ordered shows AV paced  Personal review of the device interrogation today. Results in [place]: 07/17/2016: Magnesium 2.1 12/15/2016: B Natriuretic Peptide >4,500.0 01/11/2017: ALT 32 04/03/2017: TSH 21.780 05/07/2017: BUN 30; Creatinine, Ser 1.78; Hemoglobin 12.9; Platelets 185; Potassium 4.6; Sodium 136    Lipid Panel     Component Value Date/Time   CHOL 114 04/03/2017 0815   TRIG 112 04/03/2017 0815   HDL 29 (L) 04/03/2017 0815   CHOLHDL 3.9 04/03/2017  0815   LDLCALC 63 04/03/2017 0815     Wt Readings from Last 3 Encounters:  06/13/17 170 lb 6.4 oz (77.3 kg)  05/11/17 172 lb (78 kg)  04/19/17 172 lb (78 kg)      Other studies Reviewed: Additional studies/ records that were reviewed today include: TEE 07/19/16  Review of the above records today demonstrates:  - Left ventricle: The cavity size was severely dilated. Systolic   function was severely reduced. The estimated ejection fraction   was in the range of 20% to 25%. Diffuse hypokinesis. - Aortic valve: No evidence of  vegetation. - Mitral valve: A bioprosthesis was present. - Left atrium: The atrium was moderately dilated. No evidence of   thrombus in the atrial cavity or appendage. - Right ventricle: Systolic function was moderately reduced. - Right atrium: No evidence of thrombus in the atrial cavity or   appendage. - Atrial septum: No defect or patent foramen ovale was identified. - Tricuspid valve: No evidence of vegetation. There was   mild-moderate regurgitation. - Pulmonic valve: No evidence of vegetation.  Cath 06/20/17 1. Severe native three-vessel coronary artery disease with total occlusion of the LAD, total occlusion of the ramus intermedius, and total occlusion of the RCA 2. Continued patency of the LIMA to LAD with total occlusion of the apical LAD appearance unchanged from previous cath study 3. Patency of the most recent saphenous vein graft to PDA without significant stenosis present 4. Interval occlusion of the old saphenous vein graft PDA 5. Nonvisualization of the saphenous vein graft to diagonal, suspect total occlusion from nonselective imaging 6. Low LVEDP  ASSESSMENT AND PLAN:  1.  Atrial flutter: On coumadin and amiodarone. S/p ablation 05/11/17. Has RA stopped amiodarone. We'll continue Coumadin for his valve replacement.  This patients CHA2DS2-VASc Score and unadjusted Ischemic Stroke Rate (% per year) is equal to 4.8 % stroke rate/year from a  score of 4  Above score calculated as 1 point each if present [CHF, HTN, DM, Vascular=MI/PAD/Aortic Plaque, Age if 65-74, or Male] Above score calculated as 2 points each if present [Age > 75, or Stroke/TIA/TE]   2. Complete AV block: Status post Medtronic CRT-D. Both P waves and R waves have dropped. Have adjusted the RV sensitivity to tip to coil. Should his P waves continue to drop, may have to do a lead revision.  3. Chronic systolic heart failure: On optimal medical therapy. No signs of volume overload.  4. Mitral regurgitation: Status post redo MVR  5. CAD: s/p redo CABG. status post CABG. Stable angina.     Current medicines are reviewed at length with the patient today.   The patient does not have concerns regarding his medicines.  The following changes were made today:  none  Labs/ tests ordered today include:  Orders Placed This Encounter  Procedures  . EKG 12-Lead     Disposition:   FU with Jametta Moorehead 12 months  Signed, Regie Bunner Meredith Leeds, MD  06/13/2017 11:25 AM     [facility] [address] [place] [address] [phone] (office) [phone] (fax)

## 2017-06-13 ENCOUNTER — Encounter: Payer: Self-pay | Admitting: Cardiology

## 2017-06-13 ENCOUNTER — Ambulatory Visit (INDEPENDENT_AMBULATORY_CARE_PROVIDER_SITE_OTHER): Payer: Medicare Other | Admitting: Cardiology

## 2017-06-13 VITALS — BP 128/64 | HR 80 | Ht 72.0 in | Wt 170.4 lb

## 2017-06-13 DIAGNOSIS — I483 Typical atrial flutter: Secondary | ICD-10-CM | POA: Diagnosis not present

## 2017-06-13 DIAGNOSIS — I442 Atrioventricular block, complete: Secondary | ICD-10-CM

## 2017-06-13 DIAGNOSIS — I255 Ischemic cardiomyopathy: Secondary | ICD-10-CM | POA: Diagnosis not present

## 2017-06-13 DIAGNOSIS — Z952 Presence of prosthetic heart valve: Secondary | ICD-10-CM

## 2017-06-13 DIAGNOSIS — I2589 Other forms of chronic ischemic heart disease: Secondary | ICD-10-CM

## 2017-06-18 ENCOUNTER — Telehealth: Payer: Self-pay | Admitting: *Deleted

## 2017-06-18 NOTE — Telephone Encounter (Signed)
Pt is coming off of his amiodarone (PACERONE) 200 MG tablet [[id number]] --he has 2 pills left. Please give his wife a call @ [phone]

## 2017-06-18 NOTE — Telephone Encounter (Signed)
OK to go ahead and stop Amiodarone as ordered.  Continue current coumadin dose until INR check on 06/25/17.  Dose adjustment will be made at that time if needed.  Wife verbalized understanding.

## 2017-06-21 ENCOUNTER — Other Ambulatory Visit: Payer: Self-pay | Admitting: "Endocrinology

## 2017-06-21 DIAGNOSIS — E1159 Type 2 diabetes mellitus with other circulatory complications: Secondary | ICD-10-CM | POA: Diagnosis not present

## 2017-06-23 LAB — COMPREHENSIVE METABOLIC PANEL
ALBUMIN: 4.5 g/dL (ref 3.6–4.8)
ALT: 29 IU/L (ref 0–44)
AST: 28 IU/L (ref 0–40)
Albumin/Globulin Ratio: 1.5 (ref 1.2–2.2)
Alkaline Phosphatase: 86 IU/L (ref 39–117)
BUN / CREAT RATIO: 18 (ref 10–24)
BUN: 32 mg/dL — ABNORMAL HIGH (ref 8–27)
Bilirubin Total: 0.5 mg/dL (ref 0.0–1.2)
CALCIUM: 9.9 mg/dL (ref 8.6–10.2)
CO2: 25 mmol/L (ref 20–29)
CREATININE: 1.74 mg/dL — AB (ref 0.76–1.27)
Chloride: 98 mmol/L (ref 96–106)
GFR calc Af Amer: 45 mL/min/{1.73_m2} — ABNORMAL LOW (ref >59)
GFR, EST NON AFRICAN AMERICAN: 39 mL/min/{1.73_m2} — AB (ref >59)
GLOBULIN, TOTAL: 3.1 g/dL (ref 1.5–4.5)
Glucose: 123 mg/dL — ABNORMAL HIGH (ref 65–99)
Potassium: 4.7 mmol/L (ref 3.5–5.2)
SODIUM: 139 mmol/L (ref 134–144)
Total Protein: 7.6 g/dL (ref 6.0–8.5)

## 2017-06-23 LAB — MICROALBUMIN / CREATININE URINE RATIO
Creatinine, Urine: 111.3 mg/dL
MICROALBUM., U, RANDOM: 33.5 ug/mL
Microalb/Creat Ratio: 30.1 mg/g creat — ABNORMAL HIGH (ref 0.0–30.0)

## 2017-06-23 LAB — HGB A1C W/O EAG: HEMOGLOBIN A1C: 6 % — AB (ref 4.8–5.6)

## 2017-06-25 ENCOUNTER — Ambulatory Visit (INDEPENDENT_AMBULATORY_CARE_PROVIDER_SITE_OTHER): Payer: Medicare Other | Admitting: "Endocrinology

## 2017-06-25 ENCOUNTER — Ambulatory Visit (INDEPENDENT_AMBULATORY_CARE_PROVIDER_SITE_OTHER): Payer: Medicare Other | Admitting: Thoracic Surgery (Cardiothoracic Vascular Surgery)

## 2017-06-25 ENCOUNTER — Encounter: Payer: Self-pay | Admitting: "Endocrinology

## 2017-06-25 ENCOUNTER — Ambulatory Visit (INDEPENDENT_AMBULATORY_CARE_PROVIDER_SITE_OTHER): Payer: Medicare Other | Admitting: *Deleted

## 2017-06-25 ENCOUNTER — Encounter: Payer: Self-pay | Admitting: Thoracic Surgery (Cardiothoracic Vascular Surgery)

## 2017-06-25 VITALS — BP 112/68 | HR 81 | Ht 72.0 in | Wt 169.0 lb

## 2017-06-25 VITALS — BP 119/70 | HR 78 | Resp 16 | Ht 72.0 in | Wt 169.0 lb

## 2017-06-25 DIAGNOSIS — E039 Hypothyroidism, unspecified: Secondary | ICD-10-CM | POA: Diagnosis not present

## 2017-06-25 DIAGNOSIS — I10 Essential (primary) hypertension: Secondary | ICD-10-CM | POA: Diagnosis not present

## 2017-06-25 DIAGNOSIS — Z953 Presence of xenogenic heart valve: Secondary | ICD-10-CM | POA: Diagnosis not present

## 2017-06-25 DIAGNOSIS — I4892 Unspecified atrial flutter: Secondary | ICD-10-CM

## 2017-06-25 DIAGNOSIS — E782 Mixed hyperlipidemia: Secondary | ICD-10-CM | POA: Diagnosis not present

## 2017-06-25 DIAGNOSIS — Z951 Presence of aortocoronary bypass graft: Secondary | ICD-10-CM

## 2017-06-25 DIAGNOSIS — I255 Ischemic cardiomyopathy: Secondary | ICD-10-CM

## 2017-06-25 DIAGNOSIS — Z5181 Encounter for therapeutic drug level monitoring: Secondary | ICD-10-CM | POA: Diagnosis not present

## 2017-06-25 DIAGNOSIS — E1159 Type 2 diabetes mellitus with other circulatory complications: Secondary | ICD-10-CM | POA: Diagnosis not present

## 2017-06-25 DIAGNOSIS — I483 Typical atrial flutter: Secondary | ICD-10-CM

## 2017-06-25 LAB — POCT INR: INR: 3.1

## 2017-06-25 MED ORDER — LINAGLIPTIN 5 MG PO TABS: 5.0000 mg | ORAL_TABLET | Freq: Every day | ORAL | 3 refills | Status: DC

## 2017-06-25 MED ORDER — LEVOTHYROXINE SODIUM 75 MCG PO TABS: 75.0000 ug | ORAL_TABLET | Freq: Every day | ORAL | 3 refills | Status: DC

## 2017-06-25 NOTE — Progress Notes (Signed)
[address]       [place] [address]             [phone]     CARDIOTHORACIC SURGERY OFFICE NOTE  Referring Provider is Croitoru, Dani Gobble, MD PCP is Sharilyn Sites, MD   HPI:  Patient is a 71 year old male with long-standing history of coronary artery disease, severe ischemic cardiomyopathy with chronic combined systolic and diastolic congestive heart failure, mitral regurgitation, hypertension, type 2 diabetes mellitus, and hyperlipidemia who returns to the office today for routine follow-up status post redo coronary artery bypass grafting 2 and mitral valve replacement using a bioprosthetic tissue valve on 07/11/2016.   He was last seen here in our office on 08/28/2016.  He has been followed regularly ever since by Dr. Sallyanne Kuster and Dr. Aundra Dubin, and he was recently seen in follow-up by Dr. Curt Bears in the EP clinic.  In March 2018 he underwent follow-up diagnostic cardiac catheterization after an episode of chest pain with minimally elevated troponin levels. Findings at the time of catheterization were notable for occlusion of the old saphenous vein graft to the posterior descending coronary artery with continued patency of the new saphenous vein graft placed at the time of his surgery last fall. He apparently had a follow-up echocardiogram performed at that time in [place].  He eventually underwent catheter-based ablation for atrial flutter on 05/11/2017 and was seen in follow-up by Dr. Curt Bears on 06/13/2017 at which time he was doing well.  He is now off amiodarone but he remains anticoagulated using warfarin. He returns to our office for routine follow-up and reports that overall he remains quite stable. He still gets short of breath with physical exertion and this limits his activities some degree, but he is actively participating in the cardiac rehabilitation program and states that he can exercise for an hour to an hour and a half at a time without too much  difficulty. He no longer has any resting shortness of breath, PND, orthopnea, or lower extremity edema. He also reports some exertional chest pain and pain associated with increased stress that is relieved by nitroglycerin. He uses nitroglycerin occasionally. He has not had any prolonged episodes of chest discomfort.  Overall he states that he feels somewhat improved in comparison with how he felt prior to his surgery one year ago, but he reports that his second surgical procedure was much more difficult to recover from then his original bypass surgery 10 years previous.   Current Outpatient Prescriptions  Medication Sig Dispense Refill  . ACCU-CHEK SOFTCLIX LANCETS lancets Use as instructed. E11.65 100 each 2  . amoxicillin (AMOXIL) 500 MG tablet Take 4 tablets (2,000 mg total) by mouth as needed. Pre-dental work only. 4 tablet 2  . aspirin EC 81 MG tablet Take 81 mg by mouth daily.     . bisacodyl (DULCOLAX) 5 MG EC tablet Take 5 mg by mouth as needed (Constipation).    . carvedilol (COREG) 6.25 MG tablet Take 2 tablets (12.5 mg total) by mouth every morning and take 1.5 tablets (9.375 mg total) by mouth every evening. (Patient taking differently: Takes 1.5 tablets (9.375 mg) twice daily except on days pt goes into cardio rehab take 2 tablets (12.5 mg) in the morning and 1.5 tablets (9.375 mg) every evening) 105 tablet 11  . digoxin (LANOXIN) 0.125 MG tablet Take 0.5 tablets (0.0625 mg total) by mouth daily. 15 tablet 11  . furosemide (LASIX) 20 MG tablet Take 1 tablet (20  mg total) by mouth every other day. 30 tablet 11  . levothyroxine (SYNTHROID, LEVOTHROID) 75 MCG tablet Take 1 tablet (75 mcg total) by mouth daily before breakfast. 30 tablet 3  . linagliptin (TRADJENTA) 5 MG TABS tablet Take 1 tablet (5 mg total) by mouth daily. 30 tablet 3  . neomycin-bacitracin-polymyxin (NEOSPORIN) ointment Apply 1 application topically daily as needed for wound care.    . nitroGLYCERIN (NITROSTAT) 0.4 MG SL  tablet Place 1 tablet (0.4 mg total) under the tongue every 5 (five) minutes x 3 doses as needed for chest pain. 30 tablet 12  . polyethylene glycol (MIRALAX / GLYCOLAX) packet Take 17 g by mouth daily as needed for mild constipation or moderate constipation.    . rosuvastatin (CRESTOR) 10 MG tablet Take 10 mgs by mouth once daily in the evening 90 tablet 3  . sacubitril-valsartan (ENTRESTO) 49-51 MG Take 1 tablet by mouth 2 (two) times daily. 180 tablet 3  . spironolactone (ALDACTONE) 25 MG tablet Take 0.5 tablets (12.5 mg total) by mouth daily. 30 tablet 6  . warfarin (COUMADIN) 5 MG tablet Take 5mg  by mouth once daily in the evening except Tuesdays and Saturdays take 7.5mg  once daily in the evening 45 tablet 3   No current facility-administered medications for this visit.       Physical Exam:   BP 119/70 (BP Location: Right Arm, Patient Position: Sitting, Cuff Size: Large)   Pulse 78   Resp 16   Ht 6' (1.829 m)   Wt 169 lb (76.7 kg)   SpO2 98% Comment: ON RA  BMI 22.92 kg/m   General:  Well-appearing  Chest:   Clear to auscultation  CV:   Regular rate and rhythm without murmur  Incisions:  Completely healed  Abdomen:  Soft nontender  Extremities:  Warm and well-perfused  Diagnostic Tests:  n/a   Impression:  Patient is clinically stable approximately 1 year status post redo coronary artery bypass grafting 2 and mitral valve replacement using a bioprosthetic tissue valve. He continues to experience stable symptoms of exertional shortness of breath consistent with chronic combined systolic and diastolic congestive heart failure, New [place] Heart Association functional class 2-3. He also has occasional symptoms of angina pectoris lead by nitroglycerin.  Plan:  We have not recommended any changes to the patient's current medications. The many benefits of continued regular exercise and close attention to management of his diabetes and been discussed.  The patient has been reminded  regarding the importance of dental hygiene and the lifelong need for antibiotic prophylaxis for all dental cleanings and other related invasive procedures.  He will continue to follow up with Dr Sallyanne Kuster, Dr Aundra Dubin and Dr Curt Bears and return to our office in the future only should specific problems or questions arise.  I spent in excess of 15 minutes during the conduct of this office consultation and >50% of this time involved direct face-to-face encounter with the patient for counseling and/or coordination of their care.    Valentina Gu. Roxy Manns, MD 06/25/2017 10:58 AM

## 2017-06-25 NOTE — Patient Instructions (Addendum)
Continue all previous medications without any changes at this time  Make every effort to stay physically active, get some type of exercise on a regular basis, and stick to a "heart healthy diet".  The long term benefits for regular exercise and a healthy diet are critically important to your overall health and wellbeing.  Make every effort to keep your diabetes under very tight control.  Follow up closely with your primary care physician or endocrinologist and strive to keep their hemoglobin A1c levels as low as possible, preferably near or below 6.0.  The long term benefits of strict control of diabetes are far reaching and critically important for your overall health and survival.  Endocarditis is a potentially serious infection of heart valves or inside lining of the heart.  It occurs more commonly in patients with diseased heart valves (such as patient's with aortic or mitral valve disease) and in patients who have undergone heart valve repair or replacement.  Certain surgical and dental procedures may put you at risk, such as dental cleaning, other dental procedures, or any surgery involving the respiratory, urinary, gastrointestinal tract, gallbladder or prostate gland.   To minimize your chances for develooping endocarditis, maintain good oral health and seek prompt medical attention for any infections involving the mouth, teeth, gums, skin or urinary tract.    Always notify your doctor or dentist about your underlying heart valve condition before having any invasive procedures. You will need to take antibiotics before certain procedures, including all routine dental cleanings or other dental procedures.  Your cardiologist or dentist should prescribe these antibiotics for you to be taken ahead of time.

## 2017-06-25 NOTE — Progress Notes (Signed)
Thanks, Cub Jamiria Langill 

## 2017-06-25 NOTE — Progress Notes (Signed)
Subjective:    Patient ID: Christian Bowman, male    DOB: [birth year],    Past Medical History:  Diagnosis Date  . Anginal pain ([place])   . CAD (coronary artery disease)   . Coronary artery disease involving coronary bypass graft   . Cyst of neck    right side  . DM2 (diabetes mellitus, type 2) ([place]) 08/26/2013  . Dyspnea   . Heart attack ([place])   . HTN (hypertension) 08/26/2013  . Hyperlipidemia 08/26/2013  . Left main coronary artery disease   . Left renal artery stenosis ([place])    Genesis 6x12 stent 2007  . Obesity (BMI 30.0-34.9) 08/26/2013  . Postoperative atrial fibrillation ([place]) 10/15/2005  . S/P CABG x 4 10/13/2005   LIMA to LAD, SVG to intermediate branch, sequential SVG to PDA and RPL branch, EVH via right thigh  . S/P mitral valve replacement with bioprosthetic valve 07/11/2016   31 mm [facility] Mitral bovine bioprosthetic tissue valve  . S/P redo CABG x 2 07/11/2016   SVG to PDA and SVG to Intermediate Branch, EVH via left thigh   Past Surgical History:  Procedure Laterality Date  . A-FLUTTER ABLATION N/A 05/11/2017   Procedure: A-Flutter Ablation;  Surgeon: Constance Haw, MD;  Location: [place] CV LAB;  Service: Cardiovascular;  Laterality: N/A;  . CARDIAC CATHETERIZATION N/A 06/21/2016   Procedure: Right/Left Heart Cath and Coronary/Graft Angiography;  Surgeon: Sherren Mocha, MD;  Location: [place] [facility];  Service: Cardiovascular;  Laterality: N/A;  . CARDIOVERSION N/A 07/19/2016   Procedure: CARDIOVERSION;  Surgeon: Lelon Perla, MD;  Location: [facility] ENDOSCOPY;  Service: Cardiovascular;  Laterality: N/A;  . CARDIOVERSION N/A 09/08/2016   Procedure: CARDIOVERSION;  Surgeon: Fay Records, MD;  Location: [facility] ENDOSCOPY;  Service: Cardiovascular;  Laterality: N/A;  . CORONARY ARTERY BYPASS GRAFT  10/13/2005   LIMA to LAD, SVG to intermediate branch, sequential SVG to PDA and RPL  . CORONARY ARTERY BYPASS GRAFT N/A 07/11/2016   Procedure: REDO CORONARY  ARTERY BYPASS GRAFTING (CABG) x two using left leg greater saphenous vein harvested endoscopically-SVG to PDA -SVG to RAMUS INTERMEDIATE;  Surgeon: Rexene Alberts, MD;  Location: [place];  Service: Open Heart Surgery;  Laterality: N/A;  . CORONARY ARTERY BYPASS GRAFT N/A 07/11/2016   Procedure: Re-exploration (CABG) for post op bleeding,;  Surgeon: Rexene Alberts, MD;  Location: [place];  Service: Open Heart Surgery;  Laterality: N/A;  . EP IMPLANTABLE DEVICE N/A 07/25/2016   Procedure: BiV ICD Insertion CRT-D;  Surgeon: Will Meredith Leeds, MD;  Location: [place] CV LAB;  Service: Cardiovascular;  Laterality: N/A;  . LEFT HEART CATH AND CORS/GRAFTS ANGIOGRAPHY N/A 12/18/2016   Procedure: Left Heart Cath and Cors/Grafts Angiography;  Surgeon: Sherren Mocha, MD;  Location: [place] [facility] LAB;  Service: Cardiovascular;  Laterality: N/A;  . MITRAL VALVE REPLACEMENT N/A 07/11/2016   Procedure: MITRAL VALVE (MV) REPLACEMENT;  Surgeon: Rexene Alberts, MD;  Location: [place];  Service: Open Heart Surgery;  Laterality: N/A;  . MYOCARDICAL PERFUSION  10/09/2007   NORMAL PERFUSION IN ALL REGIONS;NO EVIDENCE OF INDUCIBLE ISCHEMIA;POST STRESS EF% 66  . PERCUTANEOUS CORONARY STENT INTERVENTION (PCI-S)     DES in SVG to right coronary artery system  . RENAL ARTERY STENT Right 2007  . RENAL DOPPLER  03/28/2010   RIGHT RA-NORMAL;LEFT PROXIMAL RA AT STENT-PATENT WITH NO EVIDENCE OF SIGN DIAMETER REDUCTION. R & L KIDNEYS: EQUAL IN SIZE,SYMMETRICAL IN SHAPE.  [name]  TEE WITHOUT CARDIOVERSION N/A 06/15/2016   Procedure: TRANSESOPHAGEAL ECHOCARDIOGRAM (TEE);  Surgeon: Sanda Klein, MD;  Location: [facility] ENDOSCOPY;  Service: Cardiovascular;  Laterality: N/A;  . TEE WITHOUT CARDIOVERSION N/A 07/11/2016   Procedure: TRANSESOPHAGEAL ECHOCARDIOGRAM (TEE);  Surgeon: Rexene Alberts, MD;  Location: [place];  Service: Open Heart Surgery;  Laterality: N/A;  . TEE WITHOUT CARDIOVERSION N/A 07/19/2016   Procedure: TRANSESOPHAGEAL  ECHOCARDIOGRAM (TEE);  Surgeon: Lelon Perla, MD;  Location: [facility] ENDOSCOPY;  Service: Cardiovascular;  Laterality: N/A;  . TRANSESOPHAGEAL ECHOCARDIOGRAM  10/19/2005   NORMAL LV; MILD TO MODERATE AMOUNT OF SOFT ATHEROMATOUS PLAQUE OF THE THORACIC AORTA; THE LEFT ATRIUM IS MILDLY DILATED;LEFT ATRIAL APPENDAGE FUNCTION IS NORMAL;NO THROMBUS IDENTIFIED. SMALL PFO WITH RIGHT TO LEFT SHUNT   Social History   Social History  . Marital status: Married    Spouse name: N/A  . Number of children: N/A  . Years of education: N/A   Social History Main Topics  . Smoking status: Never Smoker  . Smokeless tobacco: Never Used  . Alcohol use No  . Drug use: No  . Sexual activity: Not Currently   Other Topics Concern  . None   Social History Narrative  . None   Outpatient Encounter Prescriptions as of 06/25/2017  Medication Sig  . ACCU-CHEK SOFTCLIX LANCETS lancets Use as instructed. E11.65  . amoxicillin (AMOXIL) 500 MG tablet Take 4 tablets (2,000 mg total) by mouth as needed. Pre-dental work only.  [name] aspirin EC 81 MG tablet Take 81 mg by mouth daily.   . bisacodyl (DULCOLAX) 5 MG EC tablet Take 5 mg by mouth as needed (Constipation).  . carvedilol (COREG) 6.25 MG tablet Take 2 tablets (12.5 mg total) by mouth every morning and take 1.5 tablets (9.375 mg total) by mouth every evening. (Patient taking differently: Takes 1.5 tablets (9.375 mg) twice daily except on days pt goes into cardio rehab take 2 tablets (12.5 mg) in the morning and 1.5 tablets (9.375 mg) every evening)  . digoxin (LANOXIN) 0.125 MG tablet Take 0.5 tablets (0.0625 mg total) by mouth daily.  . furosemide (LASIX) 20 MG tablet Take 1 tablet (20 mg total) by mouth every other day.  . levothyroxine (SYNTHROID, LEVOTHROID) 75 MCG tablet Take 1 tablet (75 mcg total) by mouth daily before breakfast.  . linagliptin (TRADJENTA) 5 MG TABS tablet Take 1 tablet (5 mg total) by mouth daily.  [name] neomycin-bacitracin-polymyxin (NEOSPORIN)  ointment Apply 1 application topically daily as needed for wound care.  . nitroGLYCERIN (NITROSTAT) 0.4 MG SL tablet Place 1 tablet (0.4 mg total) under the tongue every 5 (five) minutes x 3 doses as needed for chest pain.  . polyethylene glycol (MIRALAX / GLYCOLAX) packet Take 17 g by mouth daily as needed for mild constipation or moderate constipation.  . rosuvastatin (CRESTOR) 10 MG tablet Take 10 mgs by mouth once daily in the evening  . sacubitril-valsartan (ENTRESTO) 49-51 MG Take 1 tablet by mouth 2 (two) times daily.  [name] spironolactone (ALDACTONE) 25 MG tablet Take 0.5 tablets (12.5 mg total) by mouth daily.  [name] warfarin (COUMADIN) 5 MG tablet Take 5mg  by mouth once daily in the evening except Tuesdays and Saturdays take 7.5mg  once daily in the evening  . [DISCONTINUED] amiodarone (PACERONE) 200 MG tablet Take 1 tablet (200 mg total) by mouth at bedtime.  . [DISCONTINUED] levothyroxine (SYNTHROID, LEVOTHROID) 50 MCG tablet Take 1 tablet (50 mcg total) by mouth daily before breakfast.  . [DISCONTINUED] metFORMIN (GLUCOPHAGE) 500 MG tablet  Take 1 tablet (500 mg total) by mouth 2 (two) times daily with a meal.   No facility-administered encounter medications on file as of 06/25/2017.    ALLERGIES: Allergies  Allergen Reactions  . Xanax [Alprazolam] Other (See Comments)    Pt feels very weak, tired and feels paralyzed     VACCINATION STATUS: Immunization History  Administered Date(s) Administered  . Influenza,inj,Quad PF,6+ Mos 08/01/2016    Diabetes  He presents for his follow-up diabetic visit. He has type 2 diabetes mellitus. Onset time: He was diagnosed at approximate age of 6 years. His disease course has been stable. There are no hypoglycemic associated symptoms. Pertinent negatives for hypoglycemia include no confusion, headaches, pallor or seizures. There are no diabetic associated symptoms. Pertinent negatives for diabetes include no chest pain, no fatigue, no polydipsia, no  polyphagia, no polyuria and no weakness. There are no hypoglycemic complications. Symptoms are stable. Diabetic complications include heart disease. Risk factors for coronary artery disease include diabetes mellitus, dyslipidemia, hypertension, male sex and sedentary lifestyle. Current diabetic treatment includes oral agent (dual therapy). He is compliant with treatment most of the time. His weight is stable. He is following a diabetic diet. He participates in exercise intermittently. His home blood glucose trend is decreasing steadily. An ACE inhibitor/angiotensin II receptor blocker is being taken.  Hyperlipidemia  This is a chronic problem. The current episode started more than 1 year ago. Exacerbating diseases include diabetes. Pertinent negatives include no chest pain, myalgias or shortness of breath. Current antihyperlipidemic treatment includes statins. Risk factors for coronary artery disease include diabetes mellitus, dyslipidemia, hypertension and male sex.  Hypertension  This is a chronic problem. The current episode started more than 1 year ago. Pertinent negatives include no chest pain, headaches, neck pain, palpitations or shortness of breath. Risk factors for coronary artery disease include diabetes mellitus and dyslipidemia. Hypertensive end-organ damage includes CAD/MI.    Review of Systems  Constitutional: Negative for fatigue and unexpected weight change.  HENT: Negative for dental problem, mouth sores and trouble swallowing.   Eyes: Negative for visual disturbance.  Respiratory: Negative for cough, choking, chest tightness, shortness of breath and wheezing.   Cardiovascular: Negative for chest pain, palpitations and leg swelling.  Gastrointestinal: Negative for abdominal distention, abdominal pain, constipation, diarrhea, nausea and vomiting.  Endocrine: Negative for polydipsia, polyphagia and polyuria.  Genitourinary: Negative for dysuria, flank pain, hematuria and urgency.   Musculoskeletal: Negative for back pain, gait problem, myalgias and neck pain.  Skin: Negative for pallor, rash and wound.  Neurological: Negative for seizures, syncope, weakness, numbness and headaches.  Psychiatric/Behavioral: Negative.  Negative for confusion and dysphoric mood.    Objective:    BP 112/68   Pulse 81   Ht 6' (1.829 m)   Wt 169 lb (76.7 kg)   BMI 22.92 kg/m   Wt Readings from Last 3 Encounters:  06/25/17 169 lb (76.7 kg)  06/25/17 169 lb (76.7 kg)  06/13/17 170 lb 6.4 oz (77.3 kg)    Physical Exam  Constitutional: He is oriented to person, place, and time. He appears well-developed and well-nourished. He is cooperative. No distress.  HENT:  Head: Normocephalic and atraumatic.  Eyes: EOM are normal.  Neck: Normal range of motion. Neck supple. No tracheal deviation present. No thyromegaly present.  Cardiovascular: Normal rate, S1 normal, S2 normal and normal heart sounds.  Exam reveals no gallop.   No murmur heard. Pulses:      Dorsalis pedis pulses are 1+ on the right  side, and 1+ on the left side.       Posterior tibial pulses are 1+ on the right side, and 1+ on the left side.  Pulmonary/Chest: Breath sounds normal. No respiratory distress. He has no wheezes.  Abdominal: Soft. Bowel sounds are normal. He exhibits no distension. There is no tenderness. There is no guarding and no CVA tenderness.  Musculoskeletal: He exhibits no edema.       Right shoulder: He exhibits no swelling and no deformity.  Neurological: He is alert and oriented to person, place, and time. He has normal strength and normal reflexes. No cranial nerve deficit or sensory deficit. Gait normal.  Skin: Skin is warm and dry. No rash noted. No cyanosis. Nails show no clubbing.  Psychiatric: He has a normal mood and affect. His speech is normal and behavior is normal. Judgment and thought content normal. Cognition and memory are normal.    Chemistry (most recent): Lab Results  Component  Value Date   NA 139 06/21/2017   K 4.7 06/21/2017   CL 98 06/21/2017   CO2 25 06/21/2017   BUN 32 (H) 06/21/2017   CREATININE 1.74 (H) 06/21/2017   Results for DUFFY, DANTONIO (MRN [id number]) as of 06/25/2017 13:28  Ref. Range 06/21/2017 08:38  Glucose Latest Ref Range: 65 - 99 mg/dL 123 (H)  Hemoglobin A1C Latest Ref Range: 4.8 - 5.6 % 6.0 (H)    Assessment & Plan:   1. Type 2 diabetes mellitus with other circulatory complication (HCC)   his diabetes is  complicated by coronary artery disease status post coronary artery bypass graft and recent ACS and patient remains at a high risk for more acute and chronic complications of diabetes which include CAD, CVA, CKD, retinopathy, and neuropathy. These are all discussed in detail with the patient.  He Continues to do well, his A1c is 6%-acceptable.     Recent labs reviewed, Still showing GFR of 39 with high creatinine of 1.74- stage 3 CK D. - I have re-counseled the patient on diet management  by adopting a carbohydrate restricted / protein rich  Diet.  - Suggestion is made for him to avoid simple carbohydrates  from his diet including Cakes, Sweet Desserts, Ice Cream, Soda (diet and regular), Sweet Tea, Candies, Chips, Cookies, Store Bought Juices, Alcohol in Excess of  1-2 drinks a day, Artificial Sweeteners, and "Sugar-free" Products. This will help patient to have stable blood glucose profile and potentially avoid unintended weight gain.   - Patient is advised to stick to a routine mealtimes to eat 3 meals  a day and avoid unnecessary snacks ( to snack only to correct hypoglycemia).   - I have approached patient with the following individualized plan to manage diabetes and patient agrees.  -   I will discontinue  Metformin due to advancing CK D. I discussed and initiated Tradjenta 5 mg by mouth daily for him.  - He will not need insulin treatment at this point.  - Patient specific target  for A1c; LDL, HDL, Triglycerides, and  Waist  Circumference were discussed in detail.  2) BP/HTN: uncontrolled.  reportedly he runs low blood pressure at home and would not tolerate any additional therapy. Continue current medications including  beta blocker, spironolactone, and Lasix .  3) Lipids/HPL:  continue statins. 4)  Weight/Diet: CDE consult in progress, exercise, and carbohydrates information provided.  5) hypothyroidism: In relation to his a meal Darren treatment -He is on levothyroxine currently at 50 mg by mouth  every morning. Recent thyroid function tests are consistent with still inadequate replacement. I have advised him to increase levothyroxine to 75 g by mouth every morning.  - We discussed about correct intake of levothyroxine, at fasting, with water, separated by at least 30 minutes from breakfast, and separated by more than 4 hours from calcium, iron, multivitamins, acid reflux medications (PPIs). -Patient is made aware of the fact that thyroid hormone replacement is needed for life, dose to be adjusted by periodic monitoring of thyroid function tests.  6 ) vitamin D deficiency: He is status post therapy with  vitamin D 50,000 units weekly for 12 weeks.  7) Chronic Care/Health Maintenance:  -Patient is  on ACEI/ARB and Statin medications and encouraged to continue to follow up with Ophthalmology, Podiatrist at least yearly or according to recommendations, and advised to  stay away from smoking. I have recommended yearly flu vaccine and pneumonia vaccination at least every 5 years, and  sleep for at least 7 hours a day.  I advised patient to maintain close follow up with his cardiologist and his  PCP for primary care needs.   Follow up plan: Return in about 3 months (around 09/24/2017) for follow up with pre-visit labs.  Glade Lloyd, MD Phone: [phone]  Fax: [phone]   This note was partially dictated with voice recognition software. Similar sounding words can be transcribed inadequately or may not  be  corrected upon review.  06/25/2017, 1:26 PM

## 2017-06-26 ENCOUNTER — Ambulatory Visit: Payer: Medicare Other | Admitting: "Endocrinology

## 2017-07-04 ENCOUNTER — Telehealth: Payer: Self-pay

## 2017-07-04 DIAGNOSIS — K4020 Bilateral inguinal hernia, without obstruction or gangrene, not specified as recurrent: Secondary | ICD-10-CM | POA: Diagnosis not present

## 2017-07-04 NOTE — Telephone Encounter (Signed)
1. Type of surgery: hernia repair - repair left side open (possibly both sides) - to your discretion 2. Date of surgery: pending clearance 3. Surgeon: Dr Alphonsa Overall 4. Medications that need to be held & how long: n/a; pt takes ASA 81 mg QD, warfarin 5 mg as directed per coumadin clinic - INRs checked in our [place] office 5. Fax and/or Phone: (p) [phone] (f) [phone]

## 2017-07-09 NOTE — Telephone Encounter (Signed)
Can I please get Dr. Pollie Friar notes to see how important the hernia repair is. While I think he would probably do okay with surgery, I am also concerned that he could have complications. It will require interruption of his anticoagulation which should never be taken lightly with the mechanical mitral valve. In addition, substantial risk for worsening of heart failure.

## 2017-07-10 ENCOUNTER — Encounter: Payer: Self-pay | Admitting: Cardiovascular Disease

## 2017-07-11 ENCOUNTER — Other Ambulatory Visit: Payer: Self-pay | Admitting: Surgery

## 2017-07-11 ENCOUNTER — Ambulatory Visit (INDEPENDENT_AMBULATORY_CARE_PROVIDER_SITE_OTHER): Payer: Medicare Other | Admitting: *Deleted

## 2017-07-11 DIAGNOSIS — I483 Typical atrial flutter: Secondary | ICD-10-CM | POA: Diagnosis not present

## 2017-07-11 DIAGNOSIS — Z5181 Encounter for therapeutic drug level monitoring: Secondary | ICD-10-CM | POA: Diagnosis not present

## 2017-07-11 DIAGNOSIS — Z953 Presence of xenogenic heart valve: Secondary | ICD-10-CM | POA: Diagnosis not present

## 2017-07-11 DIAGNOSIS — Z23 Encounter for immunization: Secondary | ICD-10-CM

## 2017-07-11 LAB — POCT INR: INR: 2.3

## 2017-07-16 ENCOUNTER — Other Ambulatory Visit: Payer: Self-pay | Admitting: *Deleted

## 2017-07-16 MED ORDER — CARVEDILOL 6.25 MG PO TABS: ORAL_TABLET | ORAL | 11 refills | Status: DC

## 2017-07-30 ENCOUNTER — Ambulatory Visit (INDEPENDENT_AMBULATORY_CARE_PROVIDER_SITE_OTHER): Payer: Medicare Other | Admitting: *Deleted

## 2017-07-30 DIAGNOSIS — Z5181 Encounter for therapeutic drug level monitoring: Secondary | ICD-10-CM

## 2017-07-30 DIAGNOSIS — I483 Typical atrial flutter: Secondary | ICD-10-CM | POA: Diagnosis not present

## 2017-07-30 DIAGNOSIS — I4892 Unspecified atrial flutter: Secondary | ICD-10-CM

## 2017-07-30 DIAGNOSIS — Z953 Presence of xenogenic heart valve: Secondary | ICD-10-CM

## 2017-07-30 LAB — POCT INR: INR: 1.8

## 2017-07-31 ENCOUNTER — Ambulatory Visit (INDEPENDENT_AMBULATORY_CARE_PROVIDER_SITE_OTHER): Payer: Medicare Other | Admitting: *Deleted

## 2017-07-31 DIAGNOSIS — I255 Ischemic cardiomyopathy: Secondary | ICD-10-CM | POA: Diagnosis not present

## 2017-07-31 NOTE — Progress Notes (Signed)
Remote ICD transmission.   

## 2017-08-01 LAB — CUP PACEART REMOTE DEVICE CHECK
Battery Voltage: 2.99 V
Brady Statistic AP VP Percent: 77.98 %
Brady Statistic AP VS Percent: 1.09 %
Brady Statistic AS VP Percent: 20.54 %
Brady Statistic RA Percent Paced: 72.18 %
Date Time Interrogation Session: 20181030071702
HIGH POWER IMPEDANCE MEASURED VALUE: 49 Ohm
Implantable Lead Implant Date: 20171024
Implantable Lead Implant Date: 20171024
Implantable Lead Location: 753860
Implantable Lead Model: 5076
Lead Channel Impedance Value: 189.525
Lead Channel Impedance Value: 189.525
Lead Channel Impedance Value: 189.525
Lead Channel Impedance Value: 199.5 Ohm
Lead Channel Impedance Value: 342 Ohm
Lead Channel Impedance Value: 399 Ohm
Lead Channel Impedance Value: 399 Ohm
Lead Channel Impedance Value: 456 Ohm
Lead Channel Impedance Value: 475 Ohm
Lead Channel Impedance Value: 646 Ohm
Lead Channel Impedance Value: 646 Ohm
Lead Channel Impedance Value: 646 Ohm
Lead Channel Pacing Threshold Amplitude: 0.625 V
Lead Channel Pacing Threshold Amplitude: 1.25 V
Lead Channel Pacing Threshold Pulse Width: 0.4 ms
Lead Channel Pacing Threshold Pulse Width: 1 ms
Lead Channel Sensing Intrinsic Amplitude: 11.875 mV
Lead Channel Sensing Intrinsic Amplitude: 11.875 mV
Lead Channel Setting Pacing Amplitude: 1.75 V
Lead Channel Setting Pacing Amplitude: 2 V
Lead Channel Setting Pacing Pulse Width: 0.4 ms
Lead Channel Setting Pacing Pulse Width: 1 ms
MDC IDC LEAD IMPLANT DT: 20171024
MDC IDC LEAD LOCATION: 753858
MDC IDC LEAD LOCATION: 753859
MDC IDC MSMT BATTERY REMAINING LONGEVITY: 81 mo
MDC IDC MSMT LEADCHNL LV IMPEDANCE VALUE: 189.525
MDC IDC MSMT LEADCHNL LV IMPEDANCE VALUE: 361 Ohm
MDC IDC MSMT LEADCHNL LV IMPEDANCE VALUE: 361 Ohm
MDC IDC MSMT LEADCHNL LV IMPEDANCE VALUE: 646 Ohm
MDC IDC MSMT LEADCHNL LV IMPEDANCE VALUE: 646 Ohm
MDC IDC MSMT LEADCHNL RA IMPEDANCE VALUE: 418 Ohm
MDC IDC MSMT LEADCHNL RA PACING THRESHOLD AMPLITUDE: 0.875 V
MDC IDC MSMT LEADCHNL RA SENSING INTR AMPL: 0.625 mV
MDC IDC MSMT LEADCHNL RA SENSING INTR AMPL: 0.625 mV
MDC IDC MSMT LEADCHNL RV PACING THRESHOLD PULSEWIDTH: 0.4 ms
MDC IDC PG IMPLANT DT: 20171024
MDC IDC SET LEADCHNL LV PACING AMPLITUDE: 2.25 V
MDC IDC SET LEADCHNL RV SENSING SENSITIVITY: 0.3 mV
MDC IDC STAT BRADY AS VS PERCENT: 0.39 %
MDC IDC STAT BRADY RV PERCENT PACED: 71.79 %

## 2017-08-08 ENCOUNTER — Encounter: Payer: Self-pay | Admitting: Cardiology

## 2017-08-15 ENCOUNTER — Ambulatory Visit (INDEPENDENT_AMBULATORY_CARE_PROVIDER_SITE_OTHER): Payer: Medicare Other | Admitting: *Deleted

## 2017-08-15 DIAGNOSIS — Z5181 Encounter for therapeutic drug level monitoring: Secondary | ICD-10-CM

## 2017-08-15 DIAGNOSIS — I483 Typical atrial flutter: Secondary | ICD-10-CM | POA: Diagnosis not present

## 2017-08-15 DIAGNOSIS — Z953 Presence of xenogenic heart valve: Secondary | ICD-10-CM | POA: Diagnosis not present

## 2017-08-15 LAB — POCT INR: INR: 2.1

## 2017-08-16 NOTE — Pre-Procedure Instructions (Signed)
LEQUAN DOBRATZ  08/16/2017      [place], [place] [address] Phone: [phone] Fax: [phone]  [facility] [place] [place] [facility] [address] [facility] [address] [place] [address] Phone: [phone] Fax: [phone]    Your procedure is scheduled on Monday 08/20/2017.  Report to [facility] at 0800 A.M.  Call this number if you have problems the morning of surgery:  [phone]   Remember:  Do not eat food or drink liquids after midnight.  Take these medicines the morning of surgery with A SIP OF WATER: Carvedilol (Coreg) Digoxin (Lanoxin) Levothryoxine (Synthroid)  7 days prior to surgery STOP taking any Aspirin(unless otherwise instructed by your surgeon), Aleve, Naproxen, Ibuprofen, Motrin, Advil, Goody's, BC's, all herbal medications, fish oil, and all vitamins  Follow your doctors instructions regarding your Aspirin and Warfarin (Coumadin).  If no instructions were given by your doctor, then you will need to call the prescribing office office to get instructions.  Your pre admission RN will also call for those instructions.  WHAT DO I DO ABOUT MY DIABETES MEDICATION?  [name] Do not take oral diabetes medicines (pills) the morning of surgery. - DO NOT take your linagliptin (Tradjenta) the morning of sugrery.   How to Manage Your Diabetes Before and After Surgery  Why is it important to control my blood sugar before and after surgery? . Improving blood sugar levels before and after surgery helps healing and can limit problems. . A way of improving blood sugar control is eating a healthy diet by: o  Eating less sugar and carbohydrates o  Increasing activity/exercise o  Talking with your doctor about reaching your blood sugar goals . High blood sugars (greater than 180 mg/dL) can raise your risk of infections and  slow your recovery, so you will need to focus on controlling your diabetes during the weeks before surgery. . Make sure that the doctor who takes care of your diabetes knows about your planned surgery including the date and location.  How do I manage my blood sugar before surgery? . Check your blood sugar at least 4 times a day, starting 2 days before surgery, to make sure that the level is not too high or low. o Check your blood sugar the morning of your surgery when you wake up and every 2 hours until you get to the Short Stay unit. . If your blood sugar is less than 70 mg/dL, you will need to treat for low blood sugar: o Do not take insulin. o Treat a low blood sugar (less than 70 mg/dL) with  cup of clear juice (cranberry or apple), 4 glucose tablets, OR glucose gel. Recheck blood sugar in 15 minutes after treatment (to make sure it is greater than 70 mg/dL). If your blood sugar is not greater than 70 mg/dL on recheck, call [phone] o  for further instructions. . Report your blood sugar to the short stay nurse when you get to Short Stay.  . If you are admitted to the hospital after surgery: o Your blood sugar will be checked by the staff and you will probably be given insulin after surgery (instead of oral diabetes medicines) to make sure you have good blood sugar levels. o The goal for blood sugar control after surgery is 80-180 mg/dL.       Do not wear  jewelry  Do not wear lotions, powders, or colognes, or deodorant.  Men may shave face and neck.  Do not bring valuables to the hospital.  [facility] is not responsible for any belongings or valuables.  Contacts, eyeglasses, dentures or bridgework may not be worn into surgery.  Leave your suitcase in the car.  After surgery it may be brought to your room.  For patients admitted to the hospital, discharge time will be determined by your treatment team.  Patients discharged the day of surgery will not be allowed to drive home.    Name and phone number of your driver:    Special instructions:   [place]- Preparing For Surgery  Before surgery, you can play an important role. Because skin is not sterile, your skin needs to be as free of germs as possible. You can reduce the number of germs on your skin by washing with CHG (chlorahexidine gluconate) Soap before surgery.  CHG is an antiseptic cleaner which kills germs and bonds with the skin to continue killing germs even after washing.  Please do not use if you have an allergy to CHG or antibacterial soaps. If your skin becomes reddened/irritated stop using the CHG.  Do not shave (including legs and underarms) for at least 48 hours prior to first CHG shower. It is OK to shave your face.  Please follow these instructions carefully.   1. Shower the NIGHT BEFORE SURGERY and the MORNING OF SURGERY with CHG.   2. If you chose to wash your hair, wash your hair first as usual with your normal shampoo.  3. After you shampoo, rinse your hair and body thoroughly to remove the shampoo.  4. Use CHG as you would any other liquid soap. You can apply CHG directly to the skin and wash gently with a scrungie or a clean washcloth.   5. Apply the CHG Soap to your body ONLY FROM THE NECK DOWN.  Do not use on open wounds or open sores. Avoid contact with your eyes, ears, mouth and genitals (private parts). Wash Face and genitals (private parts)  with your normal soap.  6. Wash thoroughly, paying special attention to the area where your surgery will be performed.  7. Thoroughly rinse your body with warm water from the neck down.  8. DO NOT shower/wash with your normal soap after using and rinsing off the CHG Soap.  9. Pat yourself dry with a CLEAN TOWEL.  10. Wear CLEAN PAJAMAS to bed the night before surgery, wear comfortable clothes the morning of surgery  11. Place CLEAN SHEETS on your bed the night of your first shower and DO NOT SLEEP WITH PETS.    Day of  Surgery: Shower as stated above. Do not apply any deodorants/lotions. Please wear clean clothes to the hospital/surgery center.      Please read over the following fact sheets that you were given.

## 2017-08-17 ENCOUNTER — Encounter (HOSPITAL_COMMUNITY): Payer: Self-pay

## 2017-08-17 ENCOUNTER — Ambulatory Visit (HOSPITAL_COMMUNITY)
Admission: RE | Admit: 2017-08-17 | Discharge: 2017-08-17 | Disposition: A | Discharge status: Home / Self Care | Payer: Medicare Other | Source: Ambulatory Visit | Attending: Cardiology | Admitting: Cardiology

## 2017-08-17 ENCOUNTER — Encounter (HOSPITAL_COMMUNITY)
Admission: RE | Admit: 2017-08-17 | Discharge: 2017-08-17 | Disposition: A | Discharge status: Home / Self Care | Payer: Medicare Other | Source: Ambulatory Visit | Attending: Surgery | Admitting: Surgery

## 2017-08-17 ENCOUNTER — Encounter (HOSPITAL_COMMUNITY): Payer: Self-pay | Admitting: Cardiology

## 2017-08-17 ENCOUNTER — Other Ambulatory Visit: Payer: Self-pay

## 2017-08-17 VITALS — BP 142/88 | HR 81 | Wt 173.0 lb

## 2017-08-17 DIAGNOSIS — Z8249 Family history of ischemic heart disease and other diseases of the circulatory system: Secondary | ICD-10-CM | POA: Diagnosis not present

## 2017-08-17 DIAGNOSIS — I5022 Chronic systolic (congestive) heart failure: Secondary | ICD-10-CM | POA: Insufficient documentation

## 2017-08-17 DIAGNOSIS — K4090 Unilateral inguinal hernia, without obstruction or gangrene, not specified as recurrent: Secondary | ICD-10-CM | POA: Insufficient documentation

## 2017-08-17 DIAGNOSIS — I255 Ischemic cardiomyopathy: Secondary | ICD-10-CM | POA: Insufficient documentation

## 2017-08-17 DIAGNOSIS — I4892 Unspecified atrial flutter: Secondary | ICD-10-CM | POA: Insufficient documentation

## 2017-08-17 DIAGNOSIS — I5042 Chronic combined systolic (congestive) and diastolic (congestive) heart failure: Secondary | ICD-10-CM | POA: Diagnosis not present

## 2017-08-17 DIAGNOSIS — Z9889 Other specified postprocedural states: Secondary | ICD-10-CM | POA: Insufficient documentation

## 2017-08-17 DIAGNOSIS — I483 Typical atrial flutter: Secondary | ICD-10-CM | POA: Diagnosis not present

## 2017-08-17 DIAGNOSIS — Z79899 Other long term (current) drug therapy: Secondary | ICD-10-CM | POA: Diagnosis not present

## 2017-08-17 DIAGNOSIS — Z7901 Long term (current) use of anticoagulants: Secondary | ICD-10-CM | POA: Insufficient documentation

## 2017-08-17 DIAGNOSIS — E1122 Type 2 diabetes mellitus with diabetic chronic kidney disease: Secondary | ICD-10-CM | POA: Diagnosis not present

## 2017-08-17 DIAGNOSIS — Z7982 Long term (current) use of aspirin: Secondary | ICD-10-CM | POA: Insufficient documentation

## 2017-08-17 DIAGNOSIS — I25708 Atherosclerosis of coronary artery bypass graft(s), unspecified, with other forms of angina pectoris: Secondary | ICD-10-CM | POA: Diagnosis not present

## 2017-08-17 DIAGNOSIS — N183 Chronic kidney disease, stage 3 (moderate): Secondary | ICD-10-CM | POA: Insufficient documentation

## 2017-08-17 DIAGNOSIS — Z952 Presence of prosthetic heart valve: Secondary | ICD-10-CM

## 2017-08-17 DIAGNOSIS — I25810 Atherosclerosis of coronary artery bypass graft(s) without angina pectoris: Secondary | ICD-10-CM | POA: Diagnosis not present

## 2017-08-17 HISTORY — DX: Presence of cardiac pacemaker: Z95.0

## 2017-08-17 HISTORY — DX: Anxiety disorder, unspecified: F41.9

## 2017-08-17 HISTORY — DX: Hypothyroidism, unspecified: E03.9

## 2017-08-17 HISTORY — DX: Heart failure, unspecified: I50.9

## 2017-08-17 HISTORY — DX: Presence of automatic (implantable) cardiac defibrillator: Z95.810

## 2017-08-17 HISTORY — DX: Other complications of anesthesia, initial encounter: T88.59XA

## 2017-08-17 HISTORY — DX: Adverse effect of unspecified anesthetic, initial encounter: T41.45XA

## 2017-08-17 LAB — GLUCOSE, CAPILLARY: Glucose-Capillary: 123 mg/dL — ABNORMAL HIGH (ref 65–99)

## 2017-08-17 LAB — CBC
HEMATOCRIT: 41 % (ref 39.0–52.0)
HEMOGLOBIN: 13.6 g/dL (ref 13.0–17.0)
MCH: 31 pg (ref 26.0–34.0)
MCHC: 33.2 g/dL (ref 30.0–36.0)
MCV: 93.4 fL (ref 78.0–100.0)
Platelets: 147 10*3/uL — ABNORMAL LOW (ref 150–400)
RBC: 4.39 MIL/uL (ref 4.22–5.81)
RDW: 13.7 % (ref 11.5–15.5)
WBC: 6.5 10*3/uL (ref 4.0–10.5)

## 2017-08-17 LAB — BASIC METABOLIC PANEL
ANION GAP: 6 (ref 5–15)
BUN: 29 mg/dL — ABNORMAL HIGH (ref 6–20)
CHLORIDE: 104 mmol/L (ref 101–111)
CO2: 27 mmol/L (ref 22–32)
Calcium: 9.2 mg/dL (ref 8.9–10.3)
Creatinine, Ser: 1.86 mg/dL — ABNORMAL HIGH (ref 0.61–1.24)
GFR calc non Af Amer: 35 mL/min — ABNORMAL LOW (ref >60)
GFR, EST AFRICAN AMERICAN: 41 mL/min — AB (ref >60)
GLUCOSE: 104 mg/dL — AB (ref 65–99)
Potassium: 4.5 mmol/L (ref 3.5–5.1)
Sodium: 137 mmol/L (ref 135–145)

## 2017-08-17 LAB — PROTIME-INR
INR: 1.52
PROTHROMBIN TIME: 18.2 s — AB (ref 11.4–15.2)

## 2017-08-17 MED ORDER — CARVEDILOL 6.25 MG PO TABS: ORAL_TABLET | ORAL | 11 refills | Status: DC

## 2017-08-17 MED ORDER — DIAZEPAM 2 MG PO TABS: 2.0000 mg | ORAL_TABLET | Freq: Once | ORAL | 0 refills | Status: AC

## 2017-08-17 NOTE — Progress Notes (Signed)
PCP: Dr Hilma Favors Cardiology: Dr. Sallyanne Kuster HF Cardiology: Dr. Aundra Dubin  71 yo with history of CAD s/p CABG in 2007 then redo CABG in 10/17 with mitral valve replacement presents for cardiology followup.  He was admitted in 10/17 for redo CABG with SVG-PDA and SVG-ramus.  He also had mitral valve replacement with a bioprosthetic valve because of infarct-related mitral regurgitation.  His EF on last study (10/17 TEE) was 20-25%.   Post-operative course was complicated by CHF requiring diuresis.  He also had atrial flutter and required DCCV.  Due to complete heart block, he later got a CRT-D system.    At a prior visit, he was in atrial flutter.  He saw Dr. Curt Bears, it was decided to arrange for DCCV with ablation down the road when PPM leads have been in longer.  He had successful DCCV in 12/17 and is in NSR today.   He was admitted in 3/18 with NSTEMI and chest pain. TnI only 0.5.  LHC showed occlusion of SVG-PDA from CABG#1 to be the likely culprit.  However, SVG-PDA from CABG#2 was patent.  No intervention.  Echo in 3/18 from [place] showed EF 30-35%, stable bioprosthetic mitral valve.   He had atrial flutter ablation in 8/18.  He is in NSR today and is off amiodarone.    Patient returns for followup of CHF today.  He continues to do cardiac rehab in [place].  He has a painful inguinal hernia associated with constipation that needs repair, actually scheduled for surgery on Monday and has been holding his warfarin.  He has episodes of chest tightness and takes NTG 1-2 times/week (uses around 6-8/month).  The chest tightness is brought on by anxiety and not by exertion.  He never gets chest pain doing cardiac rehab.  He exercises for about 2.5 hours at cardiac rehab.  No palpitations.  No dyspnea except with heavy exertion.  He still has drops in his BP into the 03J systolic though this is rare now and BP has been good at cardiac rehab.  He gets lightheaded when BP drops.    Optivol (personally  reviewed): Fluid index < threshold, impedance stable.  >99% BiV pacing, no prolonged AF, no VT.  Labs (11/17): K 4.8, creatinine 1.89, hgb 12.3, digoxin 0.9, LFTs normal, TSH 8.235 (mild increase), free T3 low, free T4 normal, LFTs normal.  Labs (12/17): K 4.5, creatinine 1.7, BNP 3909 Labs (3/18): K 3.9, creatinine 1.48, hgb 11.3 Labs (4/18): digoxin 0.5, LFTs normal Labs (7/18): K 5.1, creatinine 1.78, LDL 63, HDL 29, TSH elevated Labs (11/18): K 4.5, creatinine 1.86, hgb 13.6  PMH: 1. CAD: CABG 2007.  - LHC (9/17) with patent LIMA-LAD, totally occluded SVG-D, severe stenosis in SVG-PDA.  - Redo CABG 10/17 with SVG-PDA, SVG-ramus and mitral valve replacement.  - NSTEMI 3/18.  LHC with culprit lesion likely occlusion of SVG-PDA from CABG#1.  Patent SVG-PDA from CABG#2.  No intervention.  2. Mitral regurgitation: Ischemic MR, mitral valve replacement was done in 10/17 (bioprosthetic). 3. Complete heart block: Post-op in 10/17.  Medtronic CRT-D placed.  4. Atrial flutter: DCCV 10/17 and again in 12/17.  - Ablation 8/18, now off amiodarone.  5. Type II diabetes 6. Hyperlipidemia 7. Chronic systolic CHF: Ischemic cardiomyopathy.   - TEE (10/17): EF 20-25%, severe LV dilation - Echo (3/18, [place]): EF 30-35%, bioprosthetic mitral valve with mean gradient 5 mmHg.  8. Hypothyroidism  SH: Married, retired, lives in [place], nonsmoker, no ETOH.   Family History  Problem Relation  Age of Onset  . Heart attack Mother   . Heart disease Mother   . Heart attack Father   . Heart disease Father   . Hypertension Father   . Hypertension Sister   . Heart disease Maternal Grandmother    ROS: All systems reviewed and negative except as per HPI.   Current Outpatient Medications  Medication Sig Dispense Refill  . amoxicillin (AMOXIL) 500 MG tablet Take 4 tablets (2,000 mg total) by mouth as needed. Pre-dental work only. 4 tablet 2  . aspirin EC 81 MG tablet Take 81 mg by mouth daily.     .  carvedilol (COREG) 6.25 MG tablet Takes 1.5 tablets (9.375 mg) in AM, then 2 tablets (12.5 mg) in PM 105 tablet 11  . digoxin (LANOXIN) 0.125 MG tablet Take 0.5 tablets (0.0625 mg total) by mouth daily. 15 tablet 11  . furosemide (LASIX) 20 MG tablet Take 1 tablet (20 mg total) by mouth every other day. 30 tablet 11  . levothyroxine (SYNTHROID, LEVOTHROID) 75 MCG tablet Take 1 tablet (75 mcg total) by mouth daily before breakfast. 30 tablet 3  . linagliptin (TRADJENTA) 5 MG TABS tablet Take 1 tablet (5 mg total) by mouth daily. 30 tablet 3  . neomycin-bacitracin-polymyxin (NEOSPORIN) ointment Apply 1 application topically daily as needed for wound care.    . nitroGLYCERIN (NITROSTAT) 0.4 MG SL tablet Place 1 tablet (0.4 mg total) under the tongue every 5 (five) minutes x 3 doses as needed for chest pain. 30 tablet 12  . polyethylene glycol (MIRALAX / GLYCOLAX) packet Take 17 g by mouth daily as needed for mild constipation or moderate constipation.    . rosuvastatin (CRESTOR) 10 MG tablet Take 10 mgs by mouth once daily in the evening 90 tablet 3  . sacubitril-valsartan (ENTRESTO) 49-51 MG Take 1 tablet by mouth 2 (two) times daily. 180 tablet 3  . spironolactone (ALDACTONE) 25 MG tablet Take 0.5 tablets (12.5 mg total) by mouth daily. 30 tablet 6  . warfarin (COUMADIN) 5 MG tablet Take 5mg  by mouth once daily in the evening except Tuesdays and Saturdays take 7.5mg  once daily in the evening (Patient taking differently: Take 5-7.5 mg See admin instructions by mouth. Take 7.5 mg by mouth daily on Monday, Wednesday and Friday. Take 5 mg by mouth daily on all other days) 45 tablet 3  . ACCU-CHEK SOFTCLIX LANCETS lancets Use as instructed. E11.65 (Patient not taking: Reported on 08/15/2017) 100 each 2  . diazepam (VALIUM) 2 MG tablet Take 1 tablet (2 mg total) once for 1 dose by mouth. Before surgery 2 tablet 0   No current facility-administered medications for this encounter.    BP (!) 142/88 (BP  Location: Right Arm, Patient Position: Sitting, Cuff Size: Normal)   Pulse 81   Wt 173 lb (78.5 kg)   SpO2 98%   BMI 23.46 kg/m  General: NAD Neck: No JVD, no thyromegaly or thyroid nodule. Large right neck lipoma.  Lungs: Clear to auscultation bilaterally with normal respiratory effort. CV: Nondisplaced PMI.  Heart regular S1/S2, no S3/S4, 1/6 SEM RUSB.  No peripheral edema.  No carotid bruit.  Normal pedal pulses.  Abdomen: Soft, nontender, no hepatosplenomegaly, no distention.  Skin: Intact without lesions or rashes.  Neurologic: Alert and oriented x 3.  Psych: Normal affect. Extremities: No clubbing or cyanosis.  HEENT: Normal.   Assessment/Plan: 1. Chronic systolic CHF: Ischemic cardiomyopathy.  TEE 10/17 with EF 20-25%.  Has Medtronic CRT-D system. Most recent echo from  [facility] in 3/18 with EF 30-35%.  On exam and by Optivol, he is not volume overloaded.  NYHA class II.  - Continue Lasix 20 mg every other day, recent BMET stable.   - Continue Entresto 49/51 bid.   - Continue current spironolactone and digoxin.  Labs already drawn today so will get digoxin level at next appointment.   - Increase Coreg to 9.375 mg qam/12.5 mg qpm.  Still with occasional LH with low BP, so will need to be careful with med titration.  - Repeat echo 3/19.  2. CAD: s/p redo CABG.  Admission in 3/18 with NSTEMI, LHC showed occlusion of SVG-PDA from CABG#1 but patent SVG-PDA from CABG#2, no intervention.  He has a stable pattern of chest tightness that occurs with stress and not with exertion.  He takes 6-8 NTGs/month.  I offered Imdur but he wants to continue as he is doing.   - Continue statin => Crestor 10 mg daily. Good lipids in 7/18.  - Continue cardiac rehab.   3. Bioprosthetic mitral valve: Stable on 3/18 echo from [place]. 4. Atrial flutter: s/p ablation in 8/18.  He is in NSR and off amiodarone.  - Continue warfarin.  5. CKD: Stage III.  BMET stable today.   6. Inguinal hernia:  Symptomatic.  Planned for repair on Monday at [facility].  He will be at moderate risk of post-op complications.  He understands need to take Coreg on the day of surgery.  Warfarin is on hold.  He has periodic episodes of chest pain, but stable pattern x months and only related to anxiety (not exertion).  - He is very anxious and gets chest pain at times while anxious.  I will give him two Valium 2 mg tablets, he can take 1 the morning of surgery to help with anxiety.   Followup in 2 months.    Loralie Champagne 08/17/2017

## 2017-08-17 NOTE — Progress Notes (Signed)
Anesthesia Chart Review: Patient is a 71 year old male scheduled for open left inguinal hernia repair on 08/20/17 by Dr. Alphonsa Overall.   History include CAD/MI s/p CABG (LIMA-LAD, SVG-ramus, SVG-PDA-PLA) '07 and DES to SVG-RCA (SVG-ramus occluded) 06/2015 [facility] [place]) and redo CABG (SVG-PDA, SVG-INTERMEDIATE) 07/11/16, severe MR s/p MV replacement (31 mm bioprosthetic tissue valve) combined with CABG 07/11/16 (requring re-exploration for bleeding and prolonged 21 day hospital course due to dysphagia requiring TF/TPN, CHF requiring milrinone, recurrent afib requiring DCCV, and CHB requiring CRT-D), afib (post-op) s/p cardioversion ('07, 07/19/16, 07/25/16, 09/08/16; s/p aflutter ablation 05/11/17), ischemic cardiomyopathy, systolic CHF, s/p BiV ICD insertion CRT-D (Medtronic Ciaria MRI Quad) 07/25/16, left renal artery stenosis s/p stent '07, CKD, DM2, anxiety, dyspnea. Reports it "took a while to wake up" after anesthesia.  - PCP is Dr. Sharilyn Sites. - Cardiologist is Dr. Johnn Hai Croitoru. Last visit 07/04/17. He has cleared patient with moderate risk and gave permission to hold warfarin for 5 days prior to surgery (without bridging). Biggest risk is with CHF. Continue HF medications, particularly Coreg. Due to MVR, he meets criteria for endocarditis prevention. (see Letters Tab) - HF cardiologist is Dr. Loralie Champagne. Last visit 08/17/17. He increased patient's Coreg dose and prescribed Valium prior to Monday's surgery.   - EP cardiologist is Dr. Allegra Lai. - CT surgeon is Dr. Darylene Price. - Endocrinologist is Dr. Loni Beckwith.  Meds include amoxicillin (pre-dental), ASA 81 mg, Coreg, Valium (2mg  before surgery), digoxin, Lasix, levothyroxine, Tradjenta, Nitro, Crestor, Entresto, Aldactone, warfarin (last dose 08/14/17).   BP 138/75   Pulse 74   Temp 36.8 C   Resp 20   Ht 6' (1.829 m)   Wt 173 lb 3.2 oz (78.6 kg)   SpO2 100%   BMI 23.49 kg/m   EKG 06/13/17: AV sequential or  dual chamber electronic pacemaker.  Cardiac cath 12/18/16: 1. Severe native three-vessel coronary artery disease with total occlusion of the LAD, total occlusion of the ramus intermedius, and total occlusion of the RCA 2. Continued patency of the LIMA to LAD with total occlusion of the apical LAD appearance unchanged from previous cath study 3. Patency of the most recent saphenous vein graft to PDA without significant stenosis present 4. Interval occlusion of the old saphenous vein graft PDA 5. Nonvisualization of the saphenous vein graft to diagonal, suspect total occlusion from nonselective imaging 6. Low LVEDP Recommendation: Continue medical therapy. Resume warfarin tonight. The patients clinical syndrome might be related to interval occlusion of his old vein graft to PDA. His troponin had only a mild elevation and this would be consistent with occlusion of a vein graft to a territory that is supplied by another graft with continued patency.  TEE (intra-op, post bypass) 07/11/16: Conclusions:  1. Aortic valve: The aortic valve was unchanged from the pre-bypass study and appeared normal. The leaflets opened without restriction and there was no aortic insufficiency. 2. Mitral valve: There was a bioprosthetic  valve in the mitral position. The leaflets appeared to open normally. There was 1+ mitral insufficiency with a central jet which is normal for a bioprosthetic mitral valve.  Continuous-wave Doppler interrogation of mitral inflow revealed a mean transmitral gradient of 3 mmHg. There was no perivalvular insufficiency noted. 4. Left ventricle: There was marked left ventricular enlargement and left ventricular dysfunction which appeared unchanged from the pre-bypass study. The ejection fraction was estimated at 30%. 4. Right ventricle: There appeared to be adequate right ventricular systolic function and the right ventricular cavity was  not enlarged. 5. Tricuspid valve: There was 1+ tricuspid  insufficiency which appeared unchanged from the pre-bypass study.  Carotid U/S 07/07/16: Bilateral: 1-39% ICA stenosis. Vertebral artery flow is antegrade.   CXR 08/28/16: IMPRESSION: No active cardiopulmonary disease.  Resolved pleural effusions.  PFTs 07/07/16: FVC 4.15 (89%), FEV1 2.98 (86%), DLCO unc 22.79 (67%).  Preoperative labs noted. Cr 1.86 (previously 1.74-1.78 since 12/2016), H/H 13.6/41.9, PLT 147. PT 18.2, INR 1.52. Glucose 104. A1c 6.0 on 06/21/17.   Patient with complex past medical history as outlined above. He had HF follow-up today and clearance from Dr. Sallyanne Kuster (moderate risk). He does have a Medtronic ICD (intraoperative perioperative form Rx pending). If no acute changes then I anticipate that he can proceed as planned.  George Hugh [facility]/Anesthesiology Phone [phone] 08/17/2017 12:33 PM

## 2017-08-17 NOTE — Progress Notes (Signed)
SPOKE WITH WENDY, NURSE AT [facility] WHO STATED PATIENT DID NOT HAVE TO STOP 53 MG ASPIRIN.  PATIENT'S WIFE STATED HIS LAST DOSE OF COUMADIN WAS Saturday 08/14/17.

## 2017-08-17 NOTE — Progress Notes (Signed)
PAGED [employer] REP.

## 2017-08-17 NOTE — Progress Notes (Signed)
SPOKE [place], [employer] REP WHO WILL MAKE NOTE OF SURGERY 08/20/17 1000.

## 2017-08-17 NOTE — Progress Notes (Signed)
   08/17/17 0837  OBSTRUCTIVE SLEEP APNEA  Have you ever been diagnosed with sleep apnea through a sleep study? No  Do you snore loudly (loud enough to be heard through closed doors)?  1  Do you often feel tired, fatigued, or sleepy during the daytime (such as falling asleep during driving or talking to someone)? 0  Has anyone observed you stop breathing during your sleep? 1  Do you have, or are you being treated for high blood pressure? 1  BMI more than 35 kg/m2? 0  Age > 50 (1-yes) 1  Neck circumference greater than:Male 16 inches or larger, Male 17inches or larger? 0  Male Gender (Yes=1) 1  Obstructive Sleep Apnea Score 5  Score 5 or greater  Results sent to PCP

## 2017-08-17 NOTE — Patient Instructions (Addendum)
Take Carvedilol 9.375 (1.5 tabs) in AM, then 12.5 mg (2 tabs) TAKE THE MORNING OF SURGERY   Take Valium 2 mg before surgery   Your physician recommends that you schedule a follow-up appointment in: 2 months with Dr. Aundra Dubin

## 2017-08-19 NOTE — H&P (Signed)
Christian Bowman  Location: [facility] Patient #: [id number] DOB: July 08, 1946 Married / Language: English / Race: White Male  History of Present Illness   The patient is a 71 year old male who presents with a complaint of inguinal hernia.  The PCP is Dr. Eddie Candle  The patient was referred by Dr. Lenna Sciara. [place]/Dr. Croitoru  The patient has noticed a left inguinal hernia for at least 1 year. He had a redo coronary artery bypass grafting 2 and mitral valve replacement using a bioprosthetic tissue valve on 07/11/2016 by Dr. Sherrin Daisy. They noticed a hernia before heart surgery. The hernia has not bothered him to the last few months. But he's had increasing left groin discomfort and some constipation over the last 3-4 months. Since his heart surgery last year, he's also lost about 80 pounds. He's had no prior history of stomach, colon, or liver disease. He's not had a colonoscopy as an adult, but had one when he was young. He's had no prior abdominal surgery. He has had constipation with a hernia. I suggested he get a colonoscopy (since he has never had one since age 45) - in spite of how his bowels do after the hernia repair.  I discussed the indications and complications of hernia surgery with the patient. I discussed both the laparoscopic and open approach to hernia repair.. The potential risks of hernia surgery include, but are not limited to, bleeding, infection, open surgery, nerve injury, and recurrence of the hernia. I provided the patient literature about hernia surgery. At this time, I plan to repair just the left side. I am hesistant to do the hernias laparoscopically, if anticoagulation is constant.  Past Medical History: 1. Significant cardiac disease Sees Dr. Sallyanne Kuster and Dr. Aundra Dubin CABG - 2007 Redo coronary artery bypass grafting 2 and mitral valve replacement using a bioprosthetic tissue valve on 07/11/2016 by Dr. Sherrin Daisy  - some trouble with CHF post op NSTEMI - 12/17/2016 He underwent catheter-based ablation for atrial flutter on 05/11/2017 - Dr. Allegra Lai EF (07/18/2016) - 20-25% 2. Anticoagulated on coumadin  3. Left chest pacemaker/defib  4. DM - sees Dr. Dorris Fetch much better with weight loss 5. Right neck mass - aspirated once 6. Intentional weight loss 7. CKD - stage III  Social History: Married - wife [name] with him REtired. LIves in [place].   Past Surgical History (Tanisha A. Owens Shark, [place]; 07/04/2017 9:19 AM) Coronary Artery Bypass Graft  Valve Replacement   Diagnostic Studies History (Tanisha A. Owens Shark, [place]; 07/04/2017 9:19 AM) Colonoscopy  never  Allergies (Tanisha A. Owens Shark, [place]; 07/04/2017 9:21 AM) ALPRAZolam *ANTIANXIETY AGENTS*  Allergies Reconciled   Medication History (Tanisha A. Owens Shark, RMA; 07/04/2017 9:20 AM) Carvedilol (6.25MG  Tablet, Oral) Active. Digoxin (125MCG Tablet, Oral) Active. Levothyroxine Sodium (75MCG Tablet, Oral) Active. Spironolactone (25MG  Tablet, Oral) Active. Tradjenta (5MG  Tablet, Oral) Active. Warfarin Sodium (5MG  Tablet, Oral) Active. Nitroglycerin (0.4MG  Tab Sublingual, Sublingual) Active. Rosuvastatin Calcium (10MG  Tablet, Oral) Active. Medications Reconciled  Social History (Tanisha A. Owens Shark, [place]; 07/04/2017 9:19 AM) Alcohol use  Remotely quit alcohol use. Caffeine use  Coffee, Tea. Tobacco use  Never smoker.  Family History (Tanisha A. Owens Shark, [place]; 07/04/2017 9:19 AM) Heart Disease  Father, Mother. Hypertension  Father, Mother, Sister. Respiratory Condition  Mother.  Other Problems (Tanisha A. Owens Shark, [place]; 07/04/2017 9:19 AM) Atrial Fibrillation  Chest pain  Congestive Heart Failure  Diabetes Mellitus  Heart murmur  Hemorrhoids  High blood pressure  Hypercholesterolemia  Myocardial infarction  Thyroid  Disease     Review of Systems (Tanisha A. Brown RMA; 07/04/2017 9:19 AM) General  Present- Fatigue and Weight Loss. Not Present- Appetite Loss, Chills, Fever, Night Sweats and Weight Gain. Skin Not Present- Change in Wart/Mole, Dryness, Hives, Jaundice, New Lesions, Non-Healing Wounds, Rash and Ulcer. HEENT Present- Hearing Loss, Hoarseness, Ringing in the Ears and Wears glasses/contact lenses. Not Present- Earache, Nose Bleed, Oral Ulcers, Seasonal Allergies, Sinus Pain, Sore Throat, Visual Disturbances and Yellow Eyes. Respiratory Present- Difficulty Breathing. Not Present- Bloody sputum, Chronic Cough, Snoring and Wheezing. Cardiovascular Present- Chest Pain and Shortness of Breath. Not Present- Difficulty Breathing Lying Down, Leg Cramps, Palpitations, Rapid Heart Rate and Swelling of Extremities. Gastrointestinal Present- Change in Bowel Habits and Constipation. Not Present- Abdominal Pain, Bloating, Bloody Stool, Chronic diarrhea, Difficulty Swallowing, Excessive gas, Gets full quickly at meals, Hemorrhoids, Indigestion, Nausea, Rectal Pain and Vomiting. Male Genitourinary Present- Impotence and Urgency. Not Present- Blood in Urine, Change in Urinary Stream, Frequency, Nocturia, Painful Urination and Urine Leakage. Hematology Present- Blood Thinners. Not Present- Easy Bruising, Excessive bleeding, Gland problems, HIV and Persistent Infections.  Vitals (Tanisha A. Brown RMA; 07/04/2017 9:20 AM) 07/04/2017 9:19 AM Weight: 168.8 lb Height: 72in Body Surface Area: 1.98 m Body Mass Index: 22.89 kg/m  Temp.: 97.21F  Pulse: 75 (Regular)  BP: 128/82 (Sitting, Left Arm, Standard)   Physical Exam  General: Thin WN WM alert and generally healthy appearing. HEENT: Normal. Pupils equal.  Neck: Supple. No mass. No thyroid mass. 4 cm soft mass in right mid neck Lymph Nodes: No supraclavicular or cervical nodes.  Lungs: Clear to auscultation and symmetric breath sounds. Heart: RRR. No murmur or rub. Pacemaker in left upper chest. Median sternotomy  scar.  Abdomen: Soft. No mass. Normal bowel sounds. No abdominal scars. Medium right sided inguinal hernia, reducilbe Medium left sided inguinal hernia - partially incarcerated and tender Rectal: Not done.  Extremities: Good strength and ROM in upper and lower extremities.  Neurologic: Grossly intact to motor and sensory function. Psychiatric: Has normal mood and affect. Behavior is normal.  Assessment & Plan  1.  NON-RECURRENT BILATERAL INGUINAL HERNIA WITHOUT OBSTRUCTION OR GANGRENE (K40.20)  Plan:  1) Cardiac clearance wtih Dr. Sallyanne Kuster Addendum Note(Omunique Pederson H. Bomani Oommen MD; 07/11/2017 8:53 AM) Received cardiac clearance note from Dr. Sallyanne Kuster - no need to bridge  Addendum Note(Aneyah Lortz H. Lucia Gaskins MD; 08/17/2017 5:02 PM) Planned for repair on Monday (11/19) at [facility]. He will be at moderate risk of post-op complications. He understands need to take Coreg on the day of surgery. Warfarin is on hold. He has periodic episodes of chest pain, but stable pattern x months and only related to anxiety (not exertion). - He is very anxious and gets chest pain at times while anxious. I will give him two Valium 2 mg tablets, he can take 1 the morning of surgery to help with anxiety. Followup in 2 months.  Loralie Champagne  08/17/2017   2) Repair left side open (possibly both sides) - will see what Dr. Sallyanne Kuster says about anticoagulation. I spoke to the patient. He wants to go to a waterfowl event from Nov 9 -11. Will plan surgery after return Will plan open repair of left side only.  2. Significant cardiac disease  Sees Dr. Sallyanne Kuster and Dr. Aundra Dubin  CABG - 2007  Redo coronary artery bypass grafting 2 and mitral valve replacement using a bioprosthetic tissue valve on 07/11/2016 by Dr. Sherrin Daisy - some trouble with CHF post op  NSTEMI - 12/17/2016  He underwent catheter-based ablation for atrial flutter on 05/11/2017 - Dr. Allegra Lai  EF (07/18/2016) -  20-25% 3. Anticoagulated on coumadin  4. Left chest pacemaker/defib  5. DM - sees Dr. Dorris Fetch much better with weight loss 6. Right neck mass - aspirated once 7. Intentional weight loss 8. CKD - stage III    Alphonsa Overall, MD, [facility] Surgery Pager: [phone] Office phone:  [phone]

## 2017-08-20 ENCOUNTER — Ambulatory Visit (HOSPITAL_COMMUNITY): Payer: Medicare Other | Admitting: Vascular Surgery

## 2017-08-20 ENCOUNTER — Inpatient Hospital Stay (HOSPITAL_COMMUNITY)
Admission: RE | Admit: 2017-08-20 | Discharge: 2017-08-23 | DRG: 350 | Disposition: A | Discharge status: Home / Self Care | Payer: Medicare Other | Source: Ambulatory Visit | Attending: Cardiology | Admitting: Cardiology

## 2017-08-20 ENCOUNTER — Encounter (HOSPITAL_COMMUNITY): Payer: Self-pay

## 2017-08-20 ENCOUNTER — Ambulatory Visit (HOSPITAL_COMMUNITY): Payer: Medicare Other | Admitting: Anesthesiology

## 2017-08-20 ENCOUNTER — Other Ambulatory Visit: Payer: Self-pay

## 2017-08-20 ENCOUNTER — Encounter (HOSPITAL_COMMUNITY)
Admission: RE | Disposition: A | Discharge status: Home / Self Care | Payer: Self-pay | Source: Ambulatory Visit | Attending: Surgery

## 2017-08-20 DIAGNOSIS — E785 Hyperlipidemia, unspecified: Secondary | ICD-10-CM | POA: Diagnosis present

## 2017-08-20 DIAGNOSIS — I5022 Chronic systolic (congestive) heart failure: Secondary | ICD-10-CM

## 2017-08-20 DIAGNOSIS — K5900 Constipation, unspecified: Secondary | ICD-10-CM | POA: Diagnosis present

## 2017-08-20 DIAGNOSIS — I5042 Chronic combined systolic (congestive) and diastolic (congestive) heart failure: Secondary | ICD-10-CM | POA: Diagnosis not present

## 2017-08-20 DIAGNOSIS — I25119 Atherosclerotic heart disease of native coronary artery with unspecified angina pectoris: Secondary | ICD-10-CM | POA: Diagnosis present

## 2017-08-20 DIAGNOSIS — K4090 Unilateral inguinal hernia, without obstruction or gangrene, not specified as recurrent: Secondary | ICD-10-CM | POA: Diagnosis not present

## 2017-08-20 DIAGNOSIS — I130 Hypertensive heart and chronic kidney disease with heart failure and stage 1 through stage 4 chronic kidney disease, or unspecified chronic kidney disease: Secondary | ICD-10-CM | POA: Diagnosis present

## 2017-08-20 DIAGNOSIS — Z8249 Family history of ischemic heart disease and other diseases of the circulatory system: Secondary | ICD-10-CM

## 2017-08-20 DIAGNOSIS — F419 Anxiety disorder, unspecified: Secondary | ICD-10-CM | POA: Diagnosis not present

## 2017-08-20 DIAGNOSIS — I4891 Unspecified atrial fibrillation: Secondary | ICD-10-CM | POA: Diagnosis present

## 2017-08-20 DIAGNOSIS — N183 Chronic kidney disease, stage 3 (moderate): Secondary | ICD-10-CM | POA: Diagnosis present

## 2017-08-20 DIAGNOSIS — I208 Other forms of angina pectoris: Secondary | ICD-10-CM

## 2017-08-20 DIAGNOSIS — Z955 Presence of coronary angioplasty implant and graft: Secondary | ICD-10-CM

## 2017-08-20 DIAGNOSIS — Z888 Allergy status to other drugs, medicaments and biological substances status: Secondary | ICD-10-CM

## 2017-08-20 DIAGNOSIS — R221 Localized swelling, mass and lump, neck: Secondary | ICD-10-CM | POA: Diagnosis present

## 2017-08-20 DIAGNOSIS — K4020 Bilateral inguinal hernia, without obstruction or gangrene, not specified as recurrent: Secondary | ICD-10-CM | POA: Diagnosis not present

## 2017-08-20 DIAGNOSIS — I4892 Unspecified atrial flutter: Secondary | ICD-10-CM | POA: Diagnosis present

## 2017-08-20 DIAGNOSIS — E1122 Type 2 diabetes mellitus with diabetic chronic kidney disease: Secondary | ICD-10-CM | POA: Diagnosis present

## 2017-08-20 DIAGNOSIS — G8918 Other acute postprocedural pain: Secondary | ICD-10-CM | POA: Diagnosis not present

## 2017-08-20 DIAGNOSIS — Z79890 Hormone replacement therapy (postmenopausal): Secondary | ICD-10-CM

## 2017-08-20 DIAGNOSIS — I252 Old myocardial infarction: Secondary | ICD-10-CM | POA: Diagnosis not present

## 2017-08-20 DIAGNOSIS — Z7984 Long term (current) use of oral hypoglycemic drugs: Secondary | ICD-10-CM

## 2017-08-20 DIAGNOSIS — I214 Non-ST elevation (NSTEMI) myocardial infarction: Secondary | ICD-10-CM

## 2017-08-20 DIAGNOSIS — Z7901 Long term (current) use of anticoagulants: Secondary | ICD-10-CM

## 2017-08-20 DIAGNOSIS — I2089 Other forms of angina pectoris: Secondary | ICD-10-CM

## 2017-08-20 DIAGNOSIS — Z953 Presence of xenogenic heart valve: Secondary | ICD-10-CM

## 2017-08-20 DIAGNOSIS — E7800 Pure hypercholesterolemia, unspecified: Secondary | ICD-10-CM | POA: Diagnosis present

## 2017-08-20 DIAGNOSIS — I959 Hypotension, unspecified: Secondary | ICD-10-CM | POA: Diagnosis present

## 2017-08-20 DIAGNOSIS — E039 Hypothyroidism, unspecified: Secondary | ICD-10-CM | POA: Diagnosis not present

## 2017-08-20 DIAGNOSIS — I110 Hypertensive heart disease with heart failure: Secondary | ICD-10-CM | POA: Diagnosis not present

## 2017-08-20 DIAGNOSIS — I255 Ischemic cardiomyopathy: Secondary | ICD-10-CM | POA: Diagnosis present

## 2017-08-20 DIAGNOSIS — Z79899 Other long term (current) drug therapy: Secondary | ICD-10-CM

## 2017-08-20 DIAGNOSIS — I5023 Acute on chronic systolic (congestive) heart failure: Secondary | ICD-10-CM

## 2017-08-20 HISTORY — PX: INGUINAL HERNIA REPAIR: SHX194

## 2017-08-20 HISTORY — PX: INGUINAL HERNIA REPAIR: SUR1180

## 2017-08-20 LAB — GLUCOSE, CAPILLARY
GLUCOSE-CAPILLARY: 147 mg/dL — AB (ref 65–99)
GLUCOSE-CAPILLARY: 87 mg/dL (ref 65–99)

## 2017-08-20 SURGERY — REPAIR, HERNIA, INGUINAL, ADULT
Anesthesia: General | Site: Inguinal | Laterality: Left

## 2017-08-20 MED ORDER — BUPIVACAINE LIPOSOME 1.3 % IJ SUSP
20.0000 mL | INTRAMUSCULAR | Status: AC
  Administered 2017-08-20: 20 mL
  Filled 2017-08-20: qty 20

## 2017-08-20 MED ORDER — FENTANYL CITRATE (PF) 100 MCG/2ML IJ SOLN
100.0000 ug | Freq: Once | INTRAMUSCULAR | Status: AC
  Administered 2017-08-20: 100 ug via INTRAVENOUS

## 2017-08-20 MED ORDER — DEXAMETHASONE SODIUM PHOSPHATE 10 MG/ML IJ SOLN
INTRAMUSCULAR | Status: DC | PRN
  Administered 2017-08-20: 5 mg via INTRAVENOUS

## 2017-08-20 MED ORDER — MIDAZOLAM HCL 2 MG/2ML IJ SOLN
INTRAMUSCULAR | Status: AC
  Administered 2017-08-20: 2 mg via INTRAVENOUS
  Filled 2017-08-20: qty 2

## 2017-08-20 MED ORDER — CHLORHEXIDINE GLUCONATE CLOTH 2 % EX PADS: 6.0000 | MEDICATED_PAD | Freq: Once | CUTANEOUS | Status: DC

## 2017-08-20 MED ORDER — PROPOFOL 10 MG/ML IV BOLUS
INTRAVENOUS | Status: DC | PRN
  Administered 2017-08-20 (×2): 50 mg via INTRAVENOUS

## 2017-08-20 MED ORDER — MIDAZOLAM HCL 2 MG/2ML IJ SOLN
INTRAMUSCULAR | Status: AC
  Filled 2017-08-20: qty 2

## 2017-08-20 MED ORDER — CARVEDILOL 12.5 MG PO TABS
12.5000 mg | ORAL_TABLET | Freq: Every day | ORAL | Status: DC
  Administered 2017-08-21: 12.5 mg via ORAL
  Filled 2017-08-20: qty 1

## 2017-08-20 MED ORDER — FENTANYL CITRATE (PF) 100 MCG/2ML IJ SOLN: 25.0000 ug | INTRAMUSCULAR | Status: DC | PRN

## 2017-08-20 MED ORDER — LEVOTHYROXINE SODIUM 75 MCG PO TABS
75.0000 ug | ORAL_TABLET | Freq: Every day | ORAL | Status: DC
  Administered 2017-08-21 – 2017-08-23 (×3): 75 ug via ORAL
  Filled 2017-08-20 (×3): qty 1

## 2017-08-20 MED ORDER — ROCURONIUM BROMIDE 10 MG/ML (PF) SYRINGE
PREFILLED_SYRINGE | INTRAVENOUS | Status: AC
  Filled 2017-08-20: qty 5

## 2017-08-20 MED ORDER — LIDOCAINE 2% (20 MG/ML) 5 ML SYRINGE
INTRAMUSCULAR | Status: DC | PRN
  Administered 2017-08-20: 60 mg via INTRAVENOUS

## 2017-08-20 MED ORDER — FUROSEMIDE 20 MG PO TABS
20.0000 mg | ORAL_TABLET | ORAL | Status: DC
  Administered 2017-08-20: 20 mg via ORAL
  Filled 2017-08-20: qty 1

## 2017-08-20 MED ORDER — PHENYLEPHRINE 40 MCG/ML (10ML) SYRINGE FOR IV PUSH (FOR BLOOD PRESSURE SUPPORT)
PREFILLED_SYRINGE | INTRAVENOUS | Status: DC | PRN
  Administered 2017-08-20: 40 ug via INTRAVENOUS

## 2017-08-20 MED ORDER — PROPOFOL 10 MG/ML IV BOLUS
INTRAVENOUS | Status: AC
  Filled 2017-08-20: qty 20

## 2017-08-20 MED ORDER — CEFAZOLIN SODIUM-DEXTROSE 2-4 GM/100ML-% IV SOLN
2.0000 g | INTRAVENOUS | Status: AC
  Administered 2017-08-20: 2 g via INTRAVENOUS
  Filled 2017-08-20: qty 100

## 2017-08-20 MED ORDER — 0.9 % SODIUM CHLORIDE (POUR BTL) OPTIME
TOPICAL | Status: DC | PRN
  Administered 2017-08-20: 1000 mL

## 2017-08-20 MED ORDER — NITROGLYCERIN 0.4 MG SL SUBL: 0.4000 mg | SUBLINGUAL_TABLET | SUBLINGUAL | Status: DC | PRN

## 2017-08-20 MED ORDER — CARVEDILOL 3.125 MG PO TABS
9.3750 mg | ORAL_TABLET | Freq: Every day | ORAL | Status: DC
  Administered 2017-08-21 – 2017-08-22 (×2): 9.375 mg via ORAL
  Filled 2017-08-20 (×2): qty 1

## 2017-08-20 MED ORDER — EPHEDRINE SULFATE-NACL 50-0.9 MG/10ML-% IV SOSY
PREFILLED_SYRINGE | INTRAVENOUS | Status: DC | PRN
  Administered 2017-08-20: 5 mg via INTRAVENOUS

## 2017-08-20 MED ORDER — BUPIVACAINE-EPINEPHRINE (PF) 0.25% -1:200000 IJ SOLN
INTRAMUSCULAR | Status: AC
  Filled 2017-08-20: qty 30

## 2017-08-20 MED ORDER — MIDAZOLAM HCL 2 MG/2ML IJ SOLN
2.0000 mg | Freq: Once | INTRAMUSCULAR | Status: AC
Start: 2017-08-20 — End: 2017-08-20
  Administered 2017-08-20: 2 mg via INTRAVENOUS

## 2017-08-20 MED ORDER — TRAMADOL HCL 50 MG PO TABS: 50.0000 mg | ORAL_TABLET | Freq: Four times a day (QID) | ORAL | Status: DC | PRN

## 2017-08-20 MED ORDER — ENSURE ENLIVE PO LIQD
237.0000 mL | Freq: Two times a day (BID) | ORAL | Status: DC
  Administered 2017-08-21 (×2): 237 mL via ORAL

## 2017-08-20 MED ORDER — FENTANYL CITRATE (PF) 250 MCG/5ML IJ SOLN
INTRAMUSCULAR | Status: AC
  Filled 2017-08-20: qty 5

## 2017-08-20 MED ORDER — DIGOXIN 125 MCG PO TABS
0.0625 mg | ORAL_TABLET | Freq: Every day | ORAL | Status: DC
  Administered 2017-08-21 – 2017-08-23 (×3): 0.0625 mg via ORAL
  Filled 2017-08-20 (×3): qty 1

## 2017-08-20 MED ORDER — ONDANSETRON 4 MG PO TBDP: 4.0000 mg | ORAL_TABLET | Freq: Four times a day (QID) | ORAL | Status: DC | PRN

## 2017-08-20 MED ORDER — BUPIVACAINE-EPINEPHRINE (PF) 0.5% -1:200000 IJ SOLN
INTRAMUSCULAR | Status: DC | PRN
  Administered 2017-08-20: 30 mL

## 2017-08-20 MED ORDER — FENTANYL CITRATE (PF) 100 MCG/2ML IJ SOLN
INTRAMUSCULAR | Status: AC
  Administered 2017-08-20: 100 ug via INTRAVENOUS
  Filled 2017-08-20: qty 2

## 2017-08-20 MED ORDER — ONDANSETRON HCL 4 MG/2ML IJ SOLN: 4.0000 mg | Freq: Four times a day (QID) | INTRAMUSCULAR | Status: DC | PRN

## 2017-08-20 MED ORDER — ACETAMINOPHEN 500 MG PO TABS
1000.0000 mg | ORAL_TABLET | ORAL | Status: AC
  Administered 2017-08-20: 1000 mg via ORAL
  Filled 2017-08-20: qty 2

## 2017-08-20 MED ORDER — KETAMINE HCL-SODIUM CHLORIDE 100-0.9 MG/10ML-% IV SOSY
PREFILLED_SYRINGE | INTRAVENOUS | Status: AC
  Filled 2017-08-20: qty 10

## 2017-08-20 MED ORDER — HYDROCODONE-ACETAMINOPHEN 5-325 MG PO TABS
1.0000 | ORAL_TABLET | ORAL | Status: DC | PRN
  Administered 2017-08-21 – 2017-08-22 (×3): 2 via ORAL
  Administered 2017-08-23: 1 via ORAL
  Filled 2017-08-20 (×2): qty 2
  Filled 2017-08-20 (×2): qty 1
  Filled 2017-08-20: qty 2

## 2017-08-20 MED ORDER — MORPHINE SULFATE (PF) 4 MG/ML IV SOLN: 1.0000 mg | INTRAVENOUS | Status: DC | PRN

## 2017-08-20 MED ORDER — GABAPENTIN 300 MG PO CAPS
300.0000 mg | ORAL_CAPSULE | ORAL | Status: AC
  Administered 2017-08-20: 300 mg via ORAL
  Filled 2017-08-20: qty 1

## 2017-08-20 MED ORDER — PHENYLEPHRINE HCL 10 MG/ML IJ SOLN
INTRAVENOUS | Status: DC | PRN
  Administered 2017-08-20: 40 ug/min via INTRAVENOUS

## 2017-08-20 MED ORDER — SPIRONOLACTONE 25 MG PO TABS
12.5000 mg | ORAL_TABLET | Freq: Every day | ORAL | Status: DC
  Administered 2017-08-21: 12.5 mg via ORAL
  Filled 2017-08-20: qty 1

## 2017-08-20 MED ORDER — SODIUM CHLORIDE 0.9 % IV SOLN
INTRAVENOUS | Status: DC
  Administered 2017-08-20 (×3): via INTRAVENOUS

## 2017-08-20 MED ORDER — BUPIVACAINE-EPINEPHRINE 0.25% -1:200000 IJ SOLN
INTRAMUSCULAR | Status: DC | PRN
  Administered 2017-08-20: 1 mL

## 2017-08-20 MED ORDER — POTASSIUM CHLORIDE IN NACL 20-0.45 MEQ/L-% IV SOLN
INTRAVENOUS | Status: DC
  Administered 2017-08-20: 16:00:00 via INTRAVENOUS
  Filled 2017-08-20 (×3): qty 1000

## 2017-08-20 MED ORDER — ONDANSETRON HCL 4 MG/2ML IJ SOLN
INTRAMUSCULAR | Status: DC | PRN
  Administered 2017-08-20: 4 mg via INTRAVENOUS

## 2017-08-20 MED ORDER — KETAMINE HCL 10 MG/ML IJ SOLN
INTRAMUSCULAR | Status: DC | PRN
  Administered 2017-08-20 (×2): 10 mg via INTRAVENOUS

## 2017-08-20 MED ORDER — LINAGLIPTIN 5 MG PO TABS
5.0000 mg | ORAL_TABLET | Freq: Every day | ORAL | Status: DC
  Administered 2017-08-21: 5 mg via ORAL
  Filled 2017-08-20: qty 1

## 2017-08-20 MED ORDER — HEPARIN SODIUM (PORCINE) 5000 UNIT/ML IJ SOLN
5000.0000 [IU] | Freq: Three times a day (TID) | INTRAMUSCULAR | Status: DC
  Administered 2017-08-20 – 2017-08-21 (×2): 5000 [IU] via SUBCUTANEOUS
  Filled 2017-08-20 (×2): qty 1

## 2017-08-20 MED ORDER — LIDOCAINE 2% (20 MG/ML) 5 ML SYRINGE
INTRAMUSCULAR | Status: AC
  Filled 2017-08-20: qty 5

## 2017-08-20 SURGICAL SUPPLY — 44 items
ADH SKN CLS APL DERMABOND .7 (GAUZE/BANDAGES/DRESSINGS) ×1
BLADE CLIPPER SURG (BLADE) ×1 IMPLANT
CHLORAPREP W/TINT 26ML (MISCELLANEOUS) ×2 IMPLANT
COVER SURGICAL LIGHT HANDLE (MISCELLANEOUS) ×2 IMPLANT
DERMABOND ADVANCED (GAUZE/BANDAGES/DRESSINGS) ×1
DERMABOND ADVANCED .7 DNX12 (GAUZE/BANDAGES/DRESSINGS) ×1 IMPLANT
DRAIN PENROSE 1/2X12 LTX STRL (WOUND CARE) ×1 IMPLANT
DRAPE LAPAROTOMY TRNSV 102X78 (DRAPE) ×2 IMPLANT
DRAPE UTILITY XL STRL (DRAPES) ×2 IMPLANT
ELECT CAUTERY BLADE 6.4 (BLADE) ×2 IMPLANT
ELECT REM PT RETURN 9FT ADLT (ELECTROSURGICAL) ×2
ELECTRODE REM PT RTRN 9FT ADLT (ELECTROSURGICAL) ×1 IMPLANT
GAUZE SPONGE 4X4 12PLY STRL (GAUZE/BANDAGES/DRESSINGS) ×1 IMPLANT
GLOVE BIOGEL PI IND STRL 6 (GLOVE) IMPLANT
GLOVE BIOGEL PI INDICATOR 6 (GLOVE) ×1
GLOVE SURG SIGNA 7.5 PF LTX (GLOVE) ×2 IMPLANT
GLOVE SURG SS PI 6.0 STRL IVOR (GLOVE) ×1 IMPLANT
GOWN STRL REUS W/ TWL LRG LVL3 (GOWN DISPOSABLE) ×1 IMPLANT
GOWN STRL REUS W/ TWL XL LVL3 (GOWN DISPOSABLE) ×1 IMPLANT
GOWN STRL REUS W/TWL LRG LVL3 (GOWN DISPOSABLE) ×2
GOWN STRL REUS W/TWL XL LVL3 (GOWN DISPOSABLE) ×2
KIT BASIN OR (CUSTOM PROCEDURE TRAY) ×2 IMPLANT
KIT ROOM TURNOVER OR (KITS) ×2 IMPLANT
MESH ULTRAPRO 3X6 7.6X15CM (Mesh General) ×2 IMPLANT
NDL HYPO 25GX1X1/2 BEV (NEEDLE) ×1 IMPLANT
NEEDLE HYPO 25GX1X1/2 BEV (NEEDLE) ×4 IMPLANT
NS IRRIG 1000ML POUR BTL (IV SOLUTION) ×2 IMPLANT
PACK SURGICAL SETUP 50X90 (CUSTOM PROCEDURE TRAY) ×2 IMPLANT
PAD ARMBOARD 7.5X6 YLW CONV (MISCELLANEOUS) ×3 IMPLANT
PENCIL BUTTON HOLSTER BLD 10FT (ELECTRODE) ×2 IMPLANT
SPECIMEN JAR SMALL (MISCELLANEOUS) ×1 IMPLANT
SPONGE LAP 18X18 X RAY DECT (DISPOSABLE) ×2 IMPLANT
SUT MNCRL AB 4-0 PS2 18 (SUTURE) ×2 IMPLANT
SUT NOVA 0 T19/GS 22DT (SUTURE) ×5 IMPLANT
SUT VIC AB 2-0 SH 27 (SUTURE)
SUT VIC AB 2-0 SH 27X BRD (SUTURE) IMPLANT
SUT VIC AB 3-0 SH 18 (SUTURE) ×2 IMPLANT
SUT VICRYL AB 2 0 TIES (SUTURE) ×2 IMPLANT
SYR BULB 3OZ (MISCELLANEOUS) ×2 IMPLANT
SYR CONTROL 10ML LL (SYRINGE) ×3 IMPLANT
TAPE CLOTH SURG 4X10 WHT LF (GAUZE/BANDAGES/DRESSINGS) ×1 IMPLANT
TOWEL OR 17X24 6PK STRL BLUE (TOWEL DISPOSABLE) ×2 IMPLANT
TUBE CONNECTING 12X1/4 (SUCTIONS) ×2 IMPLANT
YANKAUER SUCT BULB TIP NO VENT (SUCTIONS) ×1 IMPLANT

## 2017-08-20 NOTE — Transfer of Care (Signed)
Immediate Anesthesia Transfer of Care Note  Patient: DONAT HUMBLE  Procedure(s) Performed: OPEN LEFT INGUINAL HERNIA REPAIR (Left Inguinal)  Patient Location: PACU  Anesthesia Type:GA combined with regional for post-op pain  Level of Consciousness: sedated and drowsy  Airway & Oxygen Therapy: Patient Spontanous Breathing and Patient connected to nasal cannula oxygen  Post-op Assessment: Report given to RN, Post -op Vital signs reviewed and stable and Patient moving all extremities X 4  Post vital signs: Reviewed and stable  Last Vitals:  Vitals:   08/20/17 1030 08/20/17 1035  BP: (!) 117/59 122/61  Pulse: 72 61  Resp: 11 11  Temp:    SpO2: 100% 100%    Last Pain:  Vitals:   08/20/17 0830  TempSrc: Oral      Patients Stated Pain Goal: 3 (00/37/04 8889)  Complications: No apparent anesthesia complications

## 2017-08-20 NOTE — Interval H&P Note (Signed)
History and Physical Interval Note:  08/20/2017 10:03 AM  Christian Bowman  has presented today for surgery, with the diagnosis of LEFT INGUINAL HERNIA   The various methods of treatment have been discussed with the patient and family.   Wife at bedside.  After consideration of risks, benefits and other options for treatment, the patient has consented to  Procedure(s): OPEN LEFT INGUINAL HERNIA REPAIR (Left) as a surgical intervention .  The patient's history has been reviewed, patient examined, no change in status, stable for surgery.  I have reviewed the patient's chart and labs.  Questions were answered to the patient's satisfaction.     Shann Medal

## 2017-08-20 NOTE — Anesthesia Procedure Notes (Signed)
Anesthesia Regional Block: TAP block   Pre-Anesthetic Checklist: ,, timeout performed, Correct Patient, Correct Site, Correct Laterality, Correct Procedure, Correct Position, site marked, Risks and benefits discussed, pre-op evaluation,  At surgeon's request and post-op pain management  Laterality: Left  Prep: Maximum Sterile Barrier Precautions used, chloraprep       Needles:  Injection technique: Single-shot  Needle Type: Echogenic Stimulator Needle     Needle Length: 9cm  Needle Gauge: 21     Additional Needles:   Procedures:,,,, ultrasound used (permanent image in chart),,,,  Narrative:  Start time: 08/20/2017 10:22 AM End time: 08/20/2017 10:32 AM Injection made incrementally with aspirations every 5 mL.  Performed by: Personally  Anesthesiologist: Roderic Palau, MD  Additional Notes: 2% Lidocaine skin wheel.

## 2017-08-20 NOTE — Progress Notes (Signed)
Spoke with Tomi Bamberger from [employer] regarding the pacemaker. She advised to have the emergency form on the chart, and to call her when and if she will be needed.

## 2017-08-20 NOTE — Anesthesia Procedure Notes (Signed)
Arterial Line Insertion Start/End11/19/2018 10:35 AM Performed by: Gaylene Brooks, CRNA, CRNA  Patient location: Pre-op. Preanesthetic checklist: patient identified, IV checked, site marked, risks and benefits discussed, surgical consent, monitors and equipment checked, pre-op evaluation, timeout performed and anesthesia consent Lidocaine 1% used for infiltration and patient sedated Right, radial was placed Catheter size: 20 G Hand hygiene performed  and maximum sterile barriers used  Allen's test indicative of satisfactory collateral circulation Attempts: 1 Procedure performed without using ultrasound guided technique.

## 2017-08-20 NOTE — Anesthesia Preprocedure Evaluation (Addendum)
Anesthesia Evaluation  Patient identified by MRN, date of birth, ID band Patient awake    Reviewed: Allergy & Precautions, H&P , NPO status , Patient's Chart, lab work & pertinent test results, reviewed documented beta blocker date and time   Airway Mallampati: II  TM Distance: >3 FB Neck ROM: Full    Dental no notable dental hx. (+) Teeth Intact, Dental Advisory Given   Pulmonary neg pulmonary ROS,    Pulmonary exam normal breath sounds clear to auscultation       Cardiovascular hypertension, Pt. on medications and Pt. on home beta blockers + CAD, + Past MI, + CABG and +CHF  + pacemaker + Cardiac Defibrillator  Rhythm:Regular Rate:Normal     Neuro/Psych Anxiety negative neurological ROS  negative psych ROS   GI/Hepatic negative GI ROS, Neg liver ROS,   Endo/Other  diabetes, Type 2, Oral Hypoglycemic AgentsHypothyroidism   Renal/GU negative Renal ROS  negative genitourinary   Musculoskeletal   Abdominal   Peds  Hematology negative hematology ROS (+)   Anesthesia Other Findings   Reproductive/Obstetrics negative OB ROS                            Anesthesia Physical Anesthesia Plan  ASA: IV  Anesthesia Plan: General   Post-op Pain Management:  Regional for Post-op pain   Induction: Intravenous  PONV Risk Score and Plan: 3 and Ondansetron, Dexamethasone and Midazolam  Airway Management Planned: Oral ETT  Additional Equipment:   Intra-op Plan:   Post-operative Plan: Extubation in OR  Informed Consent: I have reviewed the patients History and Physical, chart, labs and discussed the procedure including the risks, benefits and alternatives for the proposed anesthesia with the patient or authorized representative who has indicated his/her understanding and acceptance.   Dental advisory given  Plan Discussed with: CRNA  Anesthesia Plan Comments:         Anesthesia Quick  Evaluation

## 2017-08-20 NOTE — Progress Notes (Signed)
Prosser patient from PACU following left inguinal hernia repair. Patient alert and oriented x4. Patient denies pain at this time. Accompanied by spouse.

## 2017-08-20 NOTE — Anesthesia Procedure Notes (Signed)
Procedure Name: LMA Insertion Date/Time: 08/20/2017 10:54 AM Performed by: Gaylene Brooks, CRNA Pre-anesthesia Checklist: Patient identified, Emergency Drugs available, Suction available and Patient being monitored Patient Re-evaluated:Patient Re-evaluated prior to induction Oxygen Delivery Method: Circle System Utilized Preoxygenation: Pre-oxygenation with 100% oxygen Induction Type: IV induction Ventilation: Mask ventilation without difficulty LMA: LMA inserted LMA Size: 5.0 Number of attempts: 1 Placement Confirmation: positive ETCO2 Tube secured with: Tape Dental Injury: Teeth and Oropharynx as per pre-operative assessment

## 2017-08-20 NOTE — Anesthesia Postprocedure Evaluation (Signed)
Anesthesia Post Note  Patient: Christian Bowman  Procedure(s) Performed: OPEN LEFT INGUINAL HERNIA REPAIR (Left Inguinal)     Patient location during evaluation: PACU Anesthesia Type: General and Regional Level of consciousness: awake and alert Pain management: pain level controlled Vital Signs Assessment: post-procedure vital signs reviewed and stable Respiratory status: spontaneous breathing, nonlabored ventilation and respiratory function stable Cardiovascular status: blood pressure returned to baseline and stable Postop Assessment: no apparent nausea or vomiting Anesthetic complications: no    Last Vitals:  Vitals:   08/20/17 1330 08/20/17 1339  BP: 118/66 114/64  Pulse: (!) 59 (!) 59  Resp: 15 14  Temp:  36.6 C  SpO2: 95% 95%    Last Pain:  Vitals:   08/20/17 1300  TempSrc:   PainSc: 0-No pain                 Tavoris Brisk,W. EDMOND

## 2017-08-21 ENCOUNTER — Encounter (HOSPITAL_COMMUNITY): Payer: Self-pay | Admitting: Surgery

## 2017-08-21 DIAGNOSIS — Z888 Allergy status to other drugs, medicaments and biological substances status: Secondary | ICD-10-CM | POA: Diagnosis not present

## 2017-08-21 DIAGNOSIS — Z955 Presence of coronary angioplasty implant and graft: Secondary | ICD-10-CM | POA: Diagnosis not present

## 2017-08-21 DIAGNOSIS — I4892 Unspecified atrial flutter: Secondary | ICD-10-CM | POA: Diagnosis not present

## 2017-08-21 DIAGNOSIS — I208 Other forms of angina pectoris: Secondary | ICD-10-CM

## 2017-08-21 DIAGNOSIS — Z7901 Long term (current) use of anticoagulants: Secondary | ICD-10-CM | POA: Diagnosis not present

## 2017-08-21 DIAGNOSIS — Z79890 Hormone replacement therapy (postmenopausal): Secondary | ICD-10-CM | POA: Diagnosis not present

## 2017-08-21 DIAGNOSIS — E785 Hyperlipidemia, unspecified: Secondary | ICD-10-CM | POA: Diagnosis not present

## 2017-08-21 DIAGNOSIS — F419 Anxiety disorder, unspecified: Secondary | ICD-10-CM | POA: Diagnosis not present

## 2017-08-21 DIAGNOSIS — I5022 Chronic systolic (congestive) heart failure: Secondary | ICD-10-CM | POA: Diagnosis not present

## 2017-08-21 DIAGNOSIS — E039 Hypothyroidism, unspecified: Secondary | ICD-10-CM | POA: Diagnosis not present

## 2017-08-21 DIAGNOSIS — I130 Hypertensive heart and chronic kidney disease with heart failure and stage 1 through stage 4 chronic kidney disease, or unspecified chronic kidney disease: Secondary | ICD-10-CM | POA: Diagnosis not present

## 2017-08-21 DIAGNOSIS — Z953 Presence of xenogenic heart valve: Secondary | ICD-10-CM | POA: Diagnosis not present

## 2017-08-21 DIAGNOSIS — I214 Non-ST elevation (NSTEMI) myocardial infarction: Secondary | ICD-10-CM

## 2017-08-21 DIAGNOSIS — N183 Chronic kidney disease, stage 3 (moderate): Secondary | ICD-10-CM | POA: Diagnosis not present

## 2017-08-21 DIAGNOSIS — K5900 Constipation, unspecified: Secondary | ICD-10-CM | POA: Diagnosis present

## 2017-08-21 DIAGNOSIS — I252 Old myocardial infarction: Secondary | ICD-10-CM | POA: Diagnosis not present

## 2017-08-21 DIAGNOSIS — I255 Ischemic cardiomyopathy: Secondary | ICD-10-CM | POA: Diagnosis present

## 2017-08-21 DIAGNOSIS — I4891 Unspecified atrial fibrillation: Secondary | ICD-10-CM | POA: Diagnosis not present

## 2017-08-21 DIAGNOSIS — Z8249 Family history of ischemic heart disease and other diseases of the circulatory system: Secondary | ICD-10-CM | POA: Diagnosis not present

## 2017-08-21 DIAGNOSIS — K4020 Bilateral inguinal hernia, without obstruction or gangrene, not specified as recurrent: Secondary | ICD-10-CM | POA: Diagnosis not present

## 2017-08-21 DIAGNOSIS — Z79899 Other long term (current) drug therapy: Secondary | ICD-10-CM | POA: Diagnosis not present

## 2017-08-21 DIAGNOSIS — Z7984 Long term (current) use of oral hypoglycemic drugs: Secondary | ICD-10-CM | POA: Diagnosis not present

## 2017-08-21 DIAGNOSIS — I25119 Atherosclerotic heart disease of native coronary artery with unspecified angina pectoris: Secondary | ICD-10-CM | POA: Diagnosis present

## 2017-08-21 DIAGNOSIS — R221 Localized swelling, mass and lump, neck: Secondary | ICD-10-CM | POA: Diagnosis present

## 2017-08-21 DIAGNOSIS — E1122 Type 2 diabetes mellitus with diabetic chronic kidney disease: Secondary | ICD-10-CM | POA: Diagnosis not present

## 2017-08-21 DIAGNOSIS — I361 Nonrheumatic tricuspid (valve) insufficiency: Secondary | ICD-10-CM | POA: Diagnosis not present

## 2017-08-21 LAB — HEPARIN LEVEL (UNFRACTIONATED): Heparin Unfractionated: 0.25 IU/mL — ABNORMAL LOW (ref 0.30–0.70)

## 2017-08-21 LAB — SURGICAL PCR SCREEN
MRSA, PCR: NEGATIVE
Staphylococcus aureus: NEGATIVE

## 2017-08-21 LAB — TROPONIN I
TROPONIN I: 1.23 ng/mL — AB (ref <0.03)
Troponin I: 0.12 ng/mL (ref <0.03)
Troponin I: 12.27 ng/mL (ref <0.03)

## 2017-08-21 MED ORDER — DIAZEPAM 2 MG PO TABS: 2.0000 mg | ORAL_TABLET | Freq: Three times a day (TID) | ORAL | Status: DC | PRN

## 2017-08-21 MED ORDER — ASPIRIN 81 MG PO CHEW
81.0000 mg | CHEWABLE_TABLET | Freq: Every day | ORAL | Status: DC
  Administered 2017-08-21 – 2017-08-23 (×2): 81 mg via ORAL
  Filled 2017-08-21 (×3): qty 1

## 2017-08-21 MED ORDER — ROSUVASTATIN CALCIUM 10 MG PO TABS
10.0000 mg | ORAL_TABLET | Freq: Every day | ORAL | Status: DC
  Administered 2017-08-21 – 2017-08-22 (×2): 10 mg via ORAL
  Filled 2017-08-21 (×3): qty 1

## 2017-08-21 MED ORDER — SACUBITRIL-VALSARTAN 49-51 MG PO TABS
1.0000 | ORAL_TABLET | Freq: Two times a day (BID) | ORAL | Status: DC
  Administered 2017-08-21 (×2): 1 via ORAL
  Filled 2017-08-21 (×3): qty 1

## 2017-08-21 MED ORDER — HEPARIN (PORCINE) IN NACL 100-0.45 UNIT/ML-% IJ SOLN
1000.0000 [IU]/h | INTRAMUSCULAR | Status: DC
  Administered 2017-08-21: 900 [IU]/h via INTRAVENOUS
  Filled 2017-08-21 (×2): qty 250

## 2017-08-21 NOTE — Progress Notes (Signed)
Signed           [] Hide copied text  [] Hover for details   Patient became anxious and later complained of chest pain of 4/ 10. MD was notified and orders for EKG were placed. Patient was given morning medications and vitals signs were blood pressure 152/89 ,heart rate 95, and 99 % on room air. Continue to monitor.

## 2017-08-21 NOTE — Progress Notes (Signed)
Lab reported a critical troponin of 1.23. Cardiologist was notified. Patient did not have any complaints of chest pain at this time, no new orders were placed.

## 2017-08-21 NOTE — Progress Notes (Signed)
ANTICOAGULATION CONSULT NOTE - Follow Up Consult  Pharmacy Consult for Heparin Indication: chest pain/ACS  Allergies  Allergen Reactions  . Xanax [Alprazolam] Other (See Comments)    Pt feels very weak, tired and feels paralyzed      Patient Measurements: Height: 6' (182.9 cm) Weight: 173 lb (78.5 kg) IBW/kg (Calculated) : 77.6 Heparin Dosing Weight: 78.5 kg  Vital Signs: Temp: 98.6 F (37 C) (11/20 1725) Temp Source: Oral (11/20 1725) BP: 108/65 (11/20 1725) Pulse Rate: 67 (11/20 1725)  Labs: Recent Labs    08/21/17 1115 08/21/17 1344 08/21/17 1947  HEPARINUNFRC  --   --  0.25*  TROPONINI 0.12* 1.23*  --     Estimated Creatinine Clearance: 41.1 mL/min (A) (by C-G formula based on SCr of 1.86 mg/dL (H)).   Medications:  Infusions:  . heparin 900 Units/hr (08/21/17 1509)    Assessment: 71 yo M presents on Coumadin 5mg  daily exc for 7.5mg  on MWF for Afib. Now s/p OR with elevated troponin. May be demand ischemia but will cycle troponins and may need to consider cath.  Heparin level this evening is SUBtherapeutic however drawn ~1 hour early (HL 0.25). Will not be aggressive given early lab draw and recent hernia repair with mesh. No bleeding or issues noted per RN report.  Goal of Therapy:  Heparin level 0.3-0.7 units/ml Monitor platelets by anticoagulation protocol: Yes   Plan:  1. Increase heparin to 1000 units/hr (10 ml/hr) 2. Will continue to monitor for any signs/symptoms of bleeding and will follow up with heparin level in 8 hours   Thank you for allowing pharmacy to be a part of this patient's care.  Alycia Rossetti, PharmD, BCPS Clinical Pharmacist Pager: [phone] If after 3:30p, please call main pharmacy at: [phone] 08/21/2017 8:58 PM

## 2017-08-21 NOTE — Consult Note (Addendum)
Advanced Heart Failure Team Consult Note  Primary Cardiologist:  Dr. Aundra Dubin   Reason for Consultation: Post op chest pain  HPI:    Christian Bowman is seen today for evaluation of Post op chest patin at the request of Dr. Lucia Gaskins.   Christian Bowman is a 71 y.o. male with history of CAD s/p CABG in 2007 then redo CABG in 10/17 with mitral valve replacement presents for cardiology followup.  He was admitted in 10/17 for redo CABG with SVG-PDA and SVG-ramus.  He also had mitral valve replacement with a bioprosthetic valve because of infarct-related mitral regurgitation.   Seen in HF clinic 08/17/17. Was thought to be stable overall and cleared for inguinal hernia surgery repair. Noted to have continued atypical chest pain, usually associated with anxiety. Given prophylactic anti-anxiety meds for pre-op.   Pt underwent left inguinal repair 40/98/11 without complication. Plan is for home today, but pt developed an episode of anxiety and CP after am rounds, so cards consulted for clearance for discharge.   Pt feeling better currently. He states this is unchanged from his baseline, and he became very anxious over some confusion as to whether or not he would be going home today. He had spoken to his new nurse for this am prior to d/c orders bring placed. His CP has steadily leveled off since getting a NTG, which is his usual response. He states if he gets a NTG right away it subsides more quickly, but there was some delay before he told the nurses, as again, was highly anxious about not being able to go home.   Review of systems complete and found to be negative unless listed in HPI.    Home Medications Prior to Admission medications   Medication Sig Start Date End Date Taking? Authorizing Provider  aspirin EC 81 MG tablet Take 81 mg by mouth daily.    Yes [provider]  carvedilol (COREG) 6.25 MG tablet Takes 1.5 tablets (9.375 mg) in AM, then 2 tablets (12.5 mg) in PM 08/17/17  Yes Larey Dresser, MD  digoxin (LANOXIN) 0.125 MG tablet Take 0.5 tablets (0.0625 mg total) by mouth daily. 03/09/17  Yes Croitoru, Mihai, MD  furosemide (LASIX) 20 MG tablet Take 1 tablet (20 mg total) by mouth every other day. 09/18/16  Yes Clegg, [name] D, NP  levothyroxine (SYNTHROID, LEVOTHROID) 75 MCG tablet Take 1 tablet (75 mcg total) by mouth daily before breakfast. 06/25/17  Yes Nida, Marella Chimes, MD  linagliptin (TRADJENTA) 5 MG TABS tablet Take 1 tablet (5 mg total) by mouth daily. 06/25/17  Yes Nida, Marella Chimes, MD  neomycin-bacitracin-polymyxin (NEOSPORIN) ointment Apply 1 application topically daily as needed for wound care.   Yes [provider]  nitroGLYCERIN (NITROSTAT) 0.4 MG SL tablet Place 1 tablet (0.4 mg total) under the tongue every 5 (five) minutes x 3 doses as needed for chest pain. 12/19/16  Yes Clegg, [name] D, NP  polyethylene glycol (MIRALAX / GLYCOLAX) packet Take 17 g by mouth daily as needed for mild constipation or moderate constipation.   Yes [provider]  rosuvastatin (CRESTOR) 10 MG tablet Take 10 mgs by mouth once daily in the evening 05/24/17  Yes Croitoru, Mihai, MD  sacubitril-valsartan (ENTRESTO) 49-51 MG Take 1 tablet by mouth 2 (two) times daily. 04/09/17  Yes Larey Dresser, MD  spironolactone (ALDACTONE) 25 MG tablet Take 0.5 tablets (12.5 mg total) by mouth daily. 12/19/16  Yes Clegg, [name] D, NP  ACCU-CHEK SOFTCLIX LANCETS lancets Use as instructed. E11.65 Patient not taking: Reported on 08/15/2017 12/26/16   Cassandria Anger, MD  amoxicillin (AMOXIL) 500 MG tablet Take 4 tablets (2,000 mg total) by mouth as needed. Pre-dental work only. 01/11/17   Larey Dresser, MD  warfarin (COUMADIN) 5 MG tablet Take 5mg  by mouth once daily in the evening except Tuesdays and Saturdays take 7.5mg  once daily in the evening Patient taking differently: Take 5-7.5 mg See admin instructions by mouth. Take 7.5 mg by mouth daily on Monday, Wednesday and Friday.  Take 5 mg by mouth daily on all other days 05/23/17   Croitoru, Dani Gobble, MD    Past Medical History: Past Medical History:  Diagnosis Date  . AICD (automatic cardioverter/defibrillator) present    medtronic-   DR. CROITORU , DR. Aundra Dubin   . Anginal pain ([place])    cp sat 08/11/17  . Anxiety   . CAD (coronary artery disease)   . CHF (congestive heart failure) ([place])   . Complication of anesthesia    took awhile to wake up   . Coronary artery disease involving coronary bypass graft   . Cyst of neck    right side  . DM2 (diabetes mellitus, type 2) ([place]) 08/26/2013  . Dyspnea   . Heart attack ([place])    "not sure when" (08/20/2017)  . HTN (hypertension) 08/26/2013  . Hyperlipidemia 08/26/2013  . Hypothyroidism   . Left main coronary artery disease   . Left renal artery stenosis ([place])    Genesis 6x12 stent 2007  . Obesity (BMI 30.0-34.9) 08/26/2013  . Postoperative atrial fibrillation ([place]) 10/15/2005  . Presence of permanent cardiac pacemaker   . S/P CABG x 4 10/13/2005   LIMA to LAD, SVG to intermediate branch, sequential SVG to PDA and RPL branch, EVH via right thigh  . S/P mitral valve replacement with bioprosthetic valve 07/11/2016   31 mm [facility] Mitral bovine bioprosthetic tissue valve  . S/P redo CABG x 2 07/11/2016   SVG to PDA and SVG to Intermediate Branch, EVH via left thigh    Past Surgical History: Past Surgical History:  Procedure Laterality Date  . A-Flutter Ablation N/A 05/11/2017   Performed by Constance Haw, MD at [place] [facility]  . BiV ICD Insertion CRT-D N/A 07/25/2016   Performed by Constance Haw, MD at [place] [facility]  . CARDIAC VALVE REPLACEMENT    . CARDIOVERSION N/A 09/08/2016   Performed by Fay Records, MD at [facility] ENDOSCOPY  . CARDIOVERSION N/A 07/19/2016   Performed by Lelon Perla, MD at [place]  . CORONARY ANGIOPLASTY     STENT 2016  [facility]  . CORONARY ANGIOPLASTY WITH STENT PLACEMENT     DES in SVG to  right coronary artery system  . CORONARY ARTERY BYPASS GRAFT  10/13/2005   LIMA to LAD, SVG to intermediate branch, sequential SVG to PDA and RPL  . INGUINAL HERNIA REPAIR Left 08/20/2017  . Left Heart Cath and Cors/Grafts Angiography N/A 12/18/2016   Performed by Sherren Mocha, MD at [place] [facility]  . MITRAL VALVE (MV) REPLACEMENT N/A 07/11/2016   Performed by Rexene Alberts, MD at [place]  . MYOCARDICAL PERFUSION  10/09/2007   NORMAL PERFUSION IN ALL REGIONS;NO EVIDENCE OF INDUCIBLE ISCHEMIA;POST STRESS EF% 66  . Re-exploration (CABG) for post op bleeding, N/A 07/11/2016   Performed by Rexene Alberts, MD at [place]  GRAFTING (CABG) x two using left leg greater saphenous vein harvested endoscopically-SVG to PDA -SVG to RAMUS INTERMEDIATE N/A 07/11/2016   Performed by Rexene Alberts, MD at [place]  . RENAL ARTERY STENT Right 2007  . RENAL DOPPLER  03/28/2010   RIGHT RA-NORMAL;LEFT PROXIMAL RA AT STENT-PATENT WITH NO EVIDENCE OF SIGN DIAMETER REDUCTION. R & L KIDNEYS: EQUAL IN SIZE,SYMMETRICAL IN SHAPE.  . Right/Left Heart Cath and Coronary/Graft Angiography N/A 06/21/2016   Performed by Sherren Mocha, MD at [place] [facility]  . TRANSESOPHAGEAL ECHOCARDIOGRAM  10/19/2005   NORMAL LV; MILD TO MODERATE AMOUNT OF SOFT ATHEROMATOUS PLAQUE OF THE THORACIC AORTA; THE LEFT ATRIUM IS MILDLY DILATED;LEFT ATRIAL APPENDAGE FUNCTION IS NORMAL;NO THROMBUS IDENTIFIED. SMALL PFO WITH RIGHT TO LEFT SHUNT  . TRANSESOPHAGEAL ECHOCARDIOGRAM (TEE) N/A 07/19/2016   Performed by Lelon Perla, MD at [place]  . TRANSESOPHAGEAL ECHOCARDIOGRAM (TEE) N/A 07/11/2016   Performed by Rexene Alberts, MD at [place]  . TRANSESOPHAGEAL ECHOCARDIOGRAM (TEE) N/A 06/15/2016   Performed by Sanda Klein, MD at [facility] ENDOSCOPY    Family History: Family History  Problem Relation Age of Onset  . Heart attack Mother   . Heart disease Mother   . Heart attack Father   . Heart disease  Father   . Hypertension Father   . Hypertension Sister   . Heart disease Maternal Grandmother     Social History: Social History   Socioeconomic History  . Marital status: Married    Spouse name: None  . Number of children: None  . Years of education: None  . Highest education level: None  Social Needs  . Financial resource strain: None  . Food insecurity - worry: None  . Food insecurity - inability: None  . Transportation needs - medical: None  . Transportation needs - non-medical: None  Occupational History  . None  Tobacco Use  . Smoking status: Never Smoker  . Smokeless tobacco: Never Used  Substance and Sexual Activity  . Alcohol use: No  . Drug use: No  . Sexual activity: No  Other Topics Concern  . None  Social History Narrative  . None    Allergies:  Allergies  Allergen Reactions  . Xanax [Alprazolam] Other (See Comments)    Pt feels very weak, tired and feels paralyzed      Objective:    Vital Signs:   Temp:  [97.7 F (36.5 C)-98.4 F (36.9 C)] 98 F (36.7 C) (11/20 0948) Pulse Rate:  [58-96] 92 (11/20 0948) Resp:  [9-19] 19 (11/20 0624) BP: (86-152)/(42-89) 145/85 (11/20 0948) SpO2:  [94 %-100 %] 96 % (11/20 0948) Arterial Line BP: (135-149)/(65-82) 145/65 (11/19 1300) Last BM Date: 08/16/17  Weight change: Filed Weights   08/20/17 0830  Weight: 173 lb (78.5 kg)    Intake/Output:   Intake/Output Summary (Last 24 hours) at 08/21/2017 1113 Last data filed at 08/21/2017 0625 Gross per 24 hour  Intake 1125.83 ml  Output 550 ml  Net 575.83 ml      Physical Exam    General:  Well appearing. No resp difficulty HEENT: Large R neck lipoma. Neck: supple. JVP . Carotids 2+ bilat; no bruits. No lymphadenopathy or thyromegaly appreciated. Cor: PMI nondisplaced. Regular rate & rhythm. 1/6 SEM RUSB Lungs: clear Abdomen: soft, nontender, nondistended. No hepatosplenomegaly. No bruits or masses. Good bowel sounds. Extremities: no cyanosis,  clubbing, rash, or edema Neuro: alert & orientedx3, cranial nerves grossly intact. moves all 4 extremities w/o difficulty. Affect  pleasant   Telemetry   BiV paced, personally reviewed.   EKG    BiV paced 80s, Personally reviewed.   Labs   Basic Metabolic Panel: Recent Labs  Lab 08/17/17 0857  NA 137  K 4.5  CL 104  CO2 27  GLUCOSE 104*  BUN 29*  CREATININE 1.86*  CALCIUM 9.2    Liver Function Tests: No results for input(s): AST, ALT, ALKPHOS, BILITOT, PROT, ALBUMIN in the last 168 hours. No results for input(s): LIPASE, AMYLASE in the last 168 hours. No results for input(s): AMMONIA in the last 168 hours.  CBC: Recent Labs  Lab 08/17/17 0857  WBC 6.5  HGB 13.6  HCT 41.0  MCV 93.4  PLT 147*    Cardiac Enzymes: No results for input(s): CKTOTAL, CKMB, CKMBINDEX, TROPONINI in the last 168 hours.  BNP: BNP (last 3 results) Recent Labs    09/18/16 1232 10/16/16 1239 12/15/16 0953  BNP 3,909.3* 3,654.9* >4,500.0*    ProBNP (last 3 results) No results for input(s): PROBNP in the last 8760 hours.   CBG: Recent Labs  Lab 08/17/17 0841 08/20/17 1012 08/20/17 1230  GLUCAP 123* 147* 87    Coagulation Studies: No results for input(s): LABPROT, INR in the last 72 hours.   Imaging    No results found.   Medications:     Current Medications: . carvedilol  12.5 mg Oral Daily  . carvedilol  9.375 mg Oral Daily  . digoxin  0.0625 mg Oral Daily  . feeding supplement (ENSURE ENLIVE)  237 mL Oral BID BM  . furosemide  20 mg Oral QODAY  . heparin  5,000 Units Subcutaneous Q8H  . levothyroxine  75 mcg Oral QAC breakfast  . linagliptin  5 mg Oral Daily  . sacubitril-valsartan  1 tablet Oral BID  . spironolactone  12.5 mg Oral Daily     Infusions: . 0.45 % NaCl with KCl 20 mEq / L 50 mL/hr at 08/20/17 1533       Patient Profile   Christian Bowman is a 71 y.o. male with history of CAD s/p CABG in 2007 then redo CABG in 10/17 with mitral valve  replacement presents for cardiology followup.  He was admitted in 10/17 for redo CABG with SVG-PDA and SVG-ramus.  He also had mitral valve replacement with a bioprosthetic valve because of infarct-related mitral regurgitation.   CHF team asked to see after post-op chest pain. Inguinal repair underwent 03/47/42 without complication.   Assessment/Plan   1. Chest pain in CAD s/p redo CABG - This am, while at rest. But was very anxious at the time. Suspect this is his baseline.  - Will check stat troponin. Likely will be able to go home this afternoon.  - LHC showed occlusion of SVG-PDA from CABG#1 but patent SVG-PDA from CABG#2, no intervention.  He has a stable pattern of chest tightness that occurs with stress and not with exertion.  He takes 6-8 NTGs/month.  - Has been offered Imdur but he wants to continue as he is doing.   - Continue statin => Crestor 10 mg daily. Good lipids in 7/18.  - OK to continue cardiac rehab.  1. Chronic systolic CHF: Ischemic cardiomyopathy.  TEE 10/17 with EF 20-25%.  Has Medtronic CRT-D system. Most recent echo from [facility] in 3/18 with EF 30-35% - Volume status stable on exam.  - Continue Lasix 20 mg every other day, recent BMET stable.   - Continue Entresto 49/51 bid.   -  Continue current spironolactone and digoxin. - Continue Coreg to 9.375 mg qam/12.5 mg qpm.  Still with occasional LH with low BP, so have been very careful with med titration.  - Repeat echo 3/19.  3. Bioprosthetic mitral valve: -  Stable on 3/18 echo from [place]. No change.  4. Atrial flutter: s/p ablation in 8/18.   - Remains in NSR off amio by EKG this am.  - Continue warfarin. No change.  5. CKD: Stage III.   - Stable.    6. Inguinal hernia: - s/p repair 08/20/17.   Pt had his "normal" chest pain associated with high anxiety. Stat troponin. If normal can go home.  Medication concerns reviewed with patient and pharmacy team. Barriers identified: None  Length of Stay:  0  Shirley Friar, PA-C  08/21/2017, 11:13 AM  Advanced Heart Failure Team Pager [phone] (M-F; 7a - 4p)  Please contact [facility] for night-coverage after hours (4p -7a ) and weekends on [email or web address]  Patient seen with PA, agree with the above note.  He had an uncomplicated left inguinal hernia repair yesterday.  Today, he got anxious about going home and developed his usual chest tightness, relieved by NTG.  No chest pain currently.  Exam is benign, no volume overload.  Telemetry ok, ECG unchanged.    He has a history of chest pain with anxiety, not with exertion.  This was similar to prior episodes.    Troponin was sent, waiting for result.  If negative and no more chest pain, I think he can go home today on his prior cardiac regimen.  He has cardiology followup already scheduled.   Loralie Champagne 08/21/2017 12:50 PM   TnI returned 0.12.  He has had no further chest pain.  Possible demand ischemia with underlying significant coronary disease and hypotension last night.  BP now stable to elevated.   - Cycle troponin.  If there is no trend or a down-trend, no further workup.  - If there is a marked troponin rise, would consider repeat angiography, but last cath in 3/18 in setting of NSTEMI did not show disease that could be intervened upon.  - Start back on ASA 81.  - He is on warfarin at home with history of atrial flutter.  Given elevated troponin, will keep on heparin gtt rather than restarting warfarin until we know that he will not be getting cardiac cath.   Loralie Champagne 08/21/2017 1:05 PM

## 2017-08-21 NOTE — Progress Notes (Deleted)
Patient became anxious and later complained of chest pain of 4/ 10. MD was notified and orders for EKG were placed. Patient was given morning medications and vitals signs were blood pressure 143/83 ,heart rate 96, and 96 % on room air. Continue to monitor.

## 2017-08-21 NOTE — Progress Notes (Signed)
Notified Dr Brantley Stage  of troponin level of 12.27. New order to consult cardiologist.

## 2017-08-21 NOTE — Progress Notes (Signed)
Patient's spouse administered nitro from home medications. Patient's spouse was educated on the importance of verbalizing needs to staff and not providing medications from home. Patient's spouse verbalized understanding. Patient was lying on side resting. Patient stated the nitro was beginning to help.Continue to monitor.

## 2017-08-21 NOTE — Progress Notes (Signed)
ANTICOAGULATION CONSULT NOTE - Initial Consult  Pharmacy Consult for heparin Indication: chest pain/ACS  Allergies  Allergen Reactions  . Xanax [Alprazolam] Other (See Comments)    Pt feels very weak, tired and feels paralyzed      Patient Measurements: Height: 6' (182.9 cm) Weight: 173 lb (78.5 kg) IBW/kg (Calculated) : 77.6 Heparin Dosing Weight: 78.5 kg  Assessment: 71 yo M presents on Coumadin 5mg  daily exc for 7.5mg  on MWF for Afib. Now s/p OR with elevated troponin. May be demand ischemia but will cycle troponins and may need to consider cath.  Goal of Therapy:  INR 2-3  Heparin level 0.3-0.7 units/mL Monitor platelets by anticoagulation protocol: Yes   Plan:  Hold Coumadin Start heparin gtt at 900 units/hr Monitor daily heparin level, CBC, s/s of bleed   Elenor Quinones, PharmD, BCPS Clinical Pharmacist Pager [phone] 08/21/2017 1:30 PM

## 2017-08-21 NOTE — Progress Notes (Signed)
Christian Bowman:  [phone] General Surgery Progress Note   LOS: 0 days  POD -  1 Day Post-Op  Chief Complaint: Left inguinal hernia  Assessment and Plan: 1.  OPEN LEFT INGUINAL HERNIA REPAIR - 08/20/2017 - D. Kyren Vaux  Doing well from hernia, but has had some chest pain  2.  Chest pain today  Seen and discussed with Dr. Aundra Dubin   EKG unchanged from Sept 2018  Plan continue Troponins, start anticoagulation with heparin, and follow 3. Significant cardiac disease        Sees Dr. Sallyanne Kuster and Dr. Aundra Dubin        CABG - 2007        Redo coronary artery bypass grafting 2 and mitral valve replacement using a bioprosthetic tissue valve on 07/11/2016 by Dr. Sherrin Daisy - some trouble with CHF post op        NSTEMI - 12/17/2016         He underwent catheter-based ablation for atrial flutter on 05/11/2017 - Dr. Allegra Lai          EF (07/18/2016) - 20-25% 4. Anticoagulated on coumadin              This has been on hold - now on heparin, will continue to hold.  5. Left chest pacemaker/defib  6. DM - sees Dr. Dorris Fetch much better with weight loss 7. Right neck mass - aspirated once 8. Intentional weight loss 9. CKD - stage III 10.  DVT prophylaxis - Now on IV heparin   Active Problems:   Chronic systolic CHF (congestive heart failure) (HCC)   Inguinal hernia of left side without obstruction or gangrene   Angina decubitus (HCC)   Non-ST elevation (NSTEMI) myocardial infarction [facility])   Subjective:  Had chest pain this morning, possibly due to anxiety.  But cardiology consulted, mildly elevated troponins, will start heparin and keep at least one more day.    Wife at bedside.  Objective:   Vitals:   08/21/17 0936 08/21/17 0948  BP: (!) 143/83 (!) 145/85  Pulse: 96 92  Resp:    Temp:  98 F (36.7 C)  SpO2: 96% 96%     Intake/Output from previous day:  11/19 0701 - 11/20 0700 In: 1625.8 [P.O.:240; I.V.:1310.8] Out: 550  [Urine:500; Blood:50]  Intake/Output this shift:  Total I/O In: 240 [P.O.:240] Out: -    Physical Exam:   General: WN Older WM who is alert and oriented.    HEENT: Normal. Pupils equal.  Has cystic structure on right neck .   Lungs: Clear   Abdomen: Soft   Wound: Covered   Lab Results:   No results for input(s): WBC, HGB, HCT, PLT in the last 72 hours.  BMET  No results for input(s): NA, K, CL, CO2, GLUCOSE, BUN, CREATININE, CALCIUM in the last 72 hours.  PT/INR  No results for input(s): LABPROT, INR in the last 72 hours.  ABG  No results for input(s): PHART, HCO3 in the last 72 hours.  Invalid input(s): PCO2, PO2   Studies/Results:  No results found.   Anti-infectives:   Anti-infectives (From admission, onward)   Start     Dose/Rate Route Frequency Ordered Stop   08/20/17 0907  ceFAZolin (ANCEF) IVPB 2g/100 mL premix     2 g 200 mL/hr over 30 Minutes Intravenous On call to O.R. 08/20/17 0907 08/20/17 1100      Alphonsa Overall, MD, FACS Pager: [facility] Bowman: [phone] 08/21/2017

## 2017-08-21 NOTE — Progress Notes (Signed)
Notified  Dr Radford Pax on call cardiologist regarding troponin level  Of 12.27 . New order received.

## 2017-08-21 NOTE — Op Note (Signed)
08/20/2017  2:33 PM  PATIENT:  Christian Bowman, 71 y.o., male, MRN: [id number]  PREOP DIAGNOSIS:  LEFT INGUINAL HERNIA   POSTOP DIAGNOSIS:   Large indirect left inguinal hernia  PROCEDURE:   Procedure(s): OPEN LEFT INGUINAL HERNIA REPAIR with mesh  SURGEON:   Alphonsa Overall, M.D.  ANESTHESIA:   general  Anesthesiologist: Roderic Palau, MD CRNA: Gaylene Brooks, CRNA  General  EBL: Minimal ml  LOCAL MEDICATIONS USED:   20 cc of Exparel and left abdominal wall block by anesthesia  SPECIMEN:   Hernia sac  COUNTS CORRECT:  YES  INDICATIONS FOR PROCEDURE:  Christian Bowman is a 71 y.o. (DOB: [birth year]) White male whose primary care physician is Sharilyn Sites, MD and comes for repair of left inguinal hernia.   He has significant cardiac disease and has been on oral Coumadin.  He was doing at the main hospital to monitor his heart more closely, place an A-line, and plan overnight observation.   The indications and risks of the hernia surgery were explained to the patient.  The risks include, but are not limited to, infection, bleeding, recurrence of the hernia, and nerve injury.  Operative Note: The patient was taken to room number 1 at [facility] operating room.  He underwent a general anesthesia.  He had a tap block before surgery by anesthesia.  A time out was held and the surgical checklist run.  His lower abdomen was shaved and then prepped with chloroprep.  A left inguinal incision was made through the subcutaneous fat to the external oblique fascia.  The external ring was opened and the cord structures encircled with a penrose drain.  The patient had a large indirect left inguinal hernia.  I identified the ileo inguinal nerve and preserved this during the dissection.  The patient had a hernia sac the came along the anterior medial portion of the cord structures.  The sac was dissected free of the cord structures.  The sac was opened and went to the peritoneal cavity.  The sac was  about 12 cm long.  There was no palpable mass within the peritoneal cavity.  The sac was ligated with a 2-0 Vicryl suture.  The inguinal floor was repaired with a 3 x 6 inch piece of Ultrapro mesh.  The mesh was cut to fit the inguinal floor.  The mesh was sewn in place with interrupted 0 Novafil suture.  A key hole was made for the internal ring.  The mesh lay flat.  The inguinal floor was covered and the internal ring recreated.  The cord structures were returned to a normal location.  The external oblique was closed with a 3-0 vicryl.  The fascia and subcutaneous tissues were infiltrated with 20 cc of Exparel.  The skin was closed with 4-0 monocryl and painted with LiquidBand. The sponge and needle count were correct at the end of the case.  The patient was transported to the recovery room in good condition.  Because of his cardiac disease, I will plan overnight observation.     Alphonsa Overall, MD, [facility] Surgery Pager: [phone] Office phone:  [phone]

## 2017-08-21 NOTE — Progress Notes (Signed)
Cardiologist came to assess patients and ordered labs. Patient had a critical lab value of troponin 0.12. Cardiologist was made aware. New orders were placed and followed. Will repeat troponin and continue to monitor.

## 2017-08-21 NOTE — Progress Notes (Signed)
I made rounds around 7 AM.  The patient was doing well, had no pain from surgery, and was ready to go home. His incision is covered with gauze.  His abdomen soft with bowel sounds.  But later this morning, he developed chest pain, thought his heart rate was over 100, and became very anxious.  An EKG is unchanged from 06/13/2017 with a rate of 84.  He is doing better, less anxious.  Because of significant cardiac history and this "event", I have asked cardiology to see him.  I have spoke to [employer], [employer].  Discussed with wife and nurse, Oley Balm.  Alphonsa Overall, MD, [facility] Surgery Pager: [phone] Office phone:  [phone]

## 2017-08-21 NOTE — Discharge Summary (Signed)
Physician Discharge Summary  Patient ID:  Christian Bowman  MRN: [id number]  DOB/AGE: 1946/09/18 71 y.o.  Admit date: 08/20/2017 Discharge date: 08/23/2017  Discharge Diagnoses:  1.  NON-RECURRENT BILATERAL INGUINAL HERNIA WITHOUT OBSTRUCTION OR GANGRENE (K40.20)              2.  Non ST elevated MI           Cardiac cath - 08/22/2017 - D. McLean  3. Significant cardiac disease        Sees Dr. Sallyanne Kuster and Dr. Aundra Dubin        CABG - 2007        Redo coronary artery bypass grafting 2 and mitral valve replacement using a bioprosthetic tissue valve on 07/11/2016 by Dr. Sherrin Daisy - some trouble with CHF post op        NSTEMI - 12/17/2016         He underwent catheter-based ablation for atrial flutter on 05/11/2017 - Dr. Allegra Lai          EF (07/18/2016) - 20-25% 4. Anticoagulated on coumadin  5. Left chest pacemaker/defib  6. DM - sees Dr. Dorris Fetch much better with weight loss 7. Right neck mass - aspirated once 8. Intentional weight loss 9. CKD - stage III   Active Problems:   Chronic systolic CHF (congestive heart failure) (HCC)   Inguinal hernia of left side without obstruction or gangrene   Angina decubitus (HCC)   Non-ST elevation (NSTEMI) myocardial infarction [facility])  Operation: Procedure(s): OPEN LEFT INGUINAL HERNIA REPAIR on 08/20/2017 - D. [facility] Cardiac cath - 08/22/2017 - D. McLean  Discharged Condition: fair  Hospital Course: Christian Bowman is an 71 y.o. male whose primary care physician is Sharilyn Sites, MD and who was admitted 08/20/2017 with a chief complaint of bilateral inguinal hernia.  Because of his significant cardiac history we decided to repair just a symptomatic left inguinal hernia.  He was brought to the operating room on 08/20/2017 and underwent open left inguinal hernia.   He did well with the surgery.  Had minimal pain.  Was ready for discharge on August 21, 2018 when he developed increasing chest pain.  His  troponins were checked and these were elevated to 12.27.    Dr. Einar Crow was consulted. He was placed on a heparin drip.  Because of the elevated troponins, he underwent a cardiac catheterization on 22 August 2017. At catheterization, he was found to have occlusion of PLV branch.  No interventional option.  He was tolerating food well on 22 November and ready for discharge. Continue statin and ASA. He has restarted warfarin (on at home for atrial arrhythmias).    He will restart cardiac rehab when ok'd by surgery.  Will need followup with coumadin clinic (restarted coumadin last night).  He will need cardiology followup (should have in a couple of weeks).    The discharge instructions were reviewed with the patient.  Consults: cardiology  Significant Diagnostic Studies: Results for orders placed or performed during the hospital encounter of 08/20/17  Surgical pcr screen  Result Value Ref Range   MRSA, PCR NEGATIVE NEGATIVE   Staphylococcus aureus NEGATIVE NEGATIVE  Glucose, capillary  Result Value Ref Range   Glucose-Capillary 147 (H) 65 - 99 mg/dL  Glucose, capillary  Result Value Ref Range   Glucose-Capillary 87 65 - 99 mg/dL  Troponin I  Result Value Ref Range   Troponin I 0.12 (HH) <0.03 ng/mL  Troponin I  Result Value Ref  Range   Troponin I 1.23 (HH) <0.03 ng/mL  Troponin I  Result Value Ref Range   Troponin I 12.27 (HH) <0.03 ng/mL  Heparin level (unfractionated)  Result Value Ref Range   Heparin Unfractionated 0.25 (L) 0.30 - 0.70 IU/mL  Troponin I  Result Value Ref Range   Troponin I 8.67 (HH) <0.03 ng/mL  Basic metabolic panel  Result Value Ref Range   Sodium 138 135 - 145 mmol/L   Potassium 4.7 3.5 - 5.1 mmol/L   Chloride 105 101 - 111 mmol/L   CO2 28 22 - 32 mmol/L   Glucose, Bld 126 (H) 65 - 99 mg/dL   BUN 36 (H) 6 - 20 mg/dL   Creatinine, Ser 1.89 (H) 0.61 - 1.24 mg/dL   Calcium 8.6 (L) 8.9 - 10.3 mg/dL   GFR calc non Af Amer 35 (L) >60 mL/min   GFR  calc Af Amer 40 (L) >60 mL/min   Anion gap 5 5 - 15  CBC with Differential/Platelet  Result Value Ref Range   WBC 11.4 (H) 4.0 - 10.5 K/uL   RBC 3.74 (L) 4.22 - 5.81 MIL/uL   Hemoglobin 11.7 (L) 13.0 - 17.0 g/dL   HCT 35.8 (L) 39.0 - 52.0 %   MCV 95.7 78.0 - 100.0 fL   MCH 31.3 26.0 - 34.0 pg   MCHC 32.7 30.0 - 36.0 g/dL   RDW 14.4 11.5 - 15.5 %   Platelets 127 (L) 150 - 400 K/uL   Neutrophils Relative % 77 %   Neutro Abs 8.8 (H) 1.7 - 7.7 K/uL   Lymphocytes Relative 14 %   Lymphs Abs 1.6 0.7 - 4.0 K/uL   Monocytes Relative 7 %   Monocytes Absolute 0.8 0.1 - 1.0 K/uL   Eosinophils Relative 2 %   Eosinophils Absolute 0.2 0.0 - 0.7 K/uL   Basophils Relative 0 %   Basophils Absolute 0.0 0.0 - 0.1 K/uL  Digoxin level  Result Value Ref Range   Digoxin Level 0.4 (L) 0.8 - 2.0 ng/mL  Protime-INR  Result Value Ref Range   Prothrombin Time 13.4 11.4 - 15.2 seconds   INR 1.03   Glucose, capillary  Result Value Ref Range   Glucose-Capillary 95 65 - 99 mg/dL  Glucose, capillary  Result Value Ref Range   Glucose-Capillary 136 (H) 65 - 99 mg/dL   Comment 1 Notify RN   CBC  Result Value Ref Range   WBC 8.1 4.0 - 10.5 K/uL   RBC 3.84 (L) 4.22 - 5.81 MIL/uL   Hemoglobin 11.8 (L) 13.0 - 17.0 g/dL   HCT 36.6 (L) 39.0 - 52.0 %   MCV 95.3 78.0 - 100.0 fL   MCH 30.7 26.0 - 34.0 pg   MCHC 32.2 30.0 - 36.0 g/dL   RDW 14.1 11.5 - 15.5 %   Platelets 120 (L) 150 - 400 K/uL  Protime-INR  Result Value Ref Range   Prothrombin Time 13.6 11.4 - 15.2 seconds   INR 1.82   Basic metabolic panel  Result Value Ref Range   Sodium 138 135 - 145 mmol/L   Potassium 4.9 3.5 - 5.1 mmol/L   Chloride 106 101 - 111 mmol/L   CO2 27 22 - 32 mmol/L   Glucose, Bld 120 (H) 65 - 99 mg/dL   BUN 28 (H) 6 - 20 mg/dL   Creatinine, Ser 1.63 (H) 0.61 - 1.24 mg/dL   Calcium 8.3 (L) 8.9 - 10.3 mg/dL   GFR calc non Af  Amer 41 (L) >60 mL/min   GFR calc Af Amer 48 (L) >60 mL/min   Anion gap 5 5 - 15  Glucose,  capillary  Result Value Ref Range   Glucose-Capillary 151 (H) 65 - 99 mg/dL  Glucose, capillary  Result Value Ref Range   Glucose-Capillary 92 65 - 99 mg/dL  Glucose, capillary  Result Value Ref Range   Glucose-Capillary 103 (H) 65 - 99 mg/dL   Comment 1 Notify RN   ECHOCARDIOGRAM COMPLETE  Result Value Ref Range   Weight 2,872 oz   Height 72 in   BP 96/37 mmHg    No results found.  Discharge Exam:  Vitals:   08/23/17 0808 08/23/17 1150  BP:  119/60  Pulse: 65 60  Resp:  18  Temp:  98.2 F (36.8 C)  SpO2:  97%    General: WN older WM who is alert and generally healthy appearing.  Lungs: Clear to auscultation and symmetric breath sounds. Heart:  RRR. No murmur or rub. Abdomen: Soft. No mass.  Normal bowel sounds.      Left inguinal incision looks good.  Discharge Medications:   Allergies as of 08/23/2017      Reactions   Xanax [alprazolam] Other (See Comments)   Pt feels very weak, tired and feels paralyzed        Medication List    STOP taking these medications   diazepam 2 MG tablet Commonly known as:  VALIUM   sacubitril-valsartan 49-51 MG Commonly known as:  ENTRESTO Replaced by:  sacubitril-valsartan 24-26 MG     TAKE these medications   ACCU-CHEK SOFTCLIX LANCETS lancets Use as instructed. E11.65   amoxicillin 500 MG tablet Commonly known as:  AMOXIL Take 4 tablets (2,000 mg total) by mouth as needed. Pre-dental work only.   aspirin EC 81 MG tablet Take 81 mg by mouth daily.   carvedilol 6.25 MG tablet Commonly known as:  COREG Takes 1.5 tablets (9.375 mg) in AM and PM. What changed:  additional instructions   digoxin 0.125 MG tablet Commonly known as:  LANOXIN Take 0.5 tablets (0.0625 mg total) by mouth daily.   furosemide 20 MG tablet Commonly known as:  LASIX Take 1 tablet (20 mg total) by mouth every other day. Notes to patient:  Every other day    levothyroxine 75 MCG tablet Commonly known as:  SYNTHROID, LEVOTHROID Take 1  tablet (75 mcg total) by mouth daily before breakfast.   linagliptin 5 MG Tabs tablet Commonly known as:  TRADJENTA Take 1 tablet (5 mg total) by mouth daily.   neomycin-bacitracin-polymyxin ointment Commonly known as:  NEOSPORIN Apply 1 application topically daily as needed for wound care.   nitroGLYCERIN 0.4 MG SL tablet Commonly known as:  NITROSTAT Place 1 tablet (0.4 mg total) under the tongue every 5 (five) minutes x 3 doses as needed for chest pain.   polyethylene glycol packet Commonly known as:  MIRALAX / GLYCOLAX Take 17 g by mouth daily as needed for mild constipation or moderate constipation.   rosuvastatin 10 MG tablet Commonly known as:  CRESTOR Take 10 mgs by mouth once daily in the evening   sacubitril-valsartan 24-26 MG Commonly known as:  ENTRESTO Take 1 tablet by mouth 2 (two) times daily. Replaces:  sacubitril-valsartan 49-51 MG   spironolactone 25 MG tablet Commonly known as:  ALDACTONE Take 0.5 tablets (12.5 mg total) by mouth daily.   warfarin 5 MG tablet Commonly known as:  COUMADIN Take as directed. If you  are unsure how to take this medication, talk to your nurse or doctor. Original instructions:  Take 5mg  by mouth once daily in the evening except Tuesdays and Saturdays take 7.5mg  once daily in the evening What changed:    how much to take  how to take this  when to take this  additional instructions       Disposition: 01-Home or Self Care  Discharge Instructions    Diet - low sodium heart healthy   Complete by:  As directed    Diet - low sodium heart healthy   Complete by:  As directed    Discharge instructions   Complete by:  As directed    PLEASE REMEMBER TO [facility].  PLEASE ATTEND ALL SCHEDULED FOLLOW-UP APPOINTMENTS.   Activity: Increase activity slowly as tolerated. You may shower, but no soaking baths (or swimming) for 1 week. No driving for 24 hours. No lifting  over 5 lbs for 1 week. No sexual activity for 1 week.   You May Return to Work: in 1 week (if applicable)  Wound Care: You may wash cath site gently with soap and water. Keep cath site clean and dry. If you notice pain, swelling, bleeding or pus at your cath site, please call [phone].   Increase activity slowly   Complete by:  As directed    Increase activity slowly   Complete by:  As directed       Follow-up Information    [place]. Go on 09/04/2017.   Specialty:  Cardiology Why:  11:30 AM, [facility], parking code [employer] information: [address] [id number] [place] [phone]          Signed: Alphonsa Overall, M.D., [facility] Office:  [phone]  08/25/2017, 5:31 PM

## 2017-08-21 NOTE — Progress Notes (Signed)
EKG results were reported to the doctor. Patient results of EKG were similar to previous EKG results with little changes. Patient's spouse requested patient have home medication that had missed dosages and to contact patient's cardiac doctor. MD made aware.

## 2017-08-21 NOTE — Discharge Instructions (Signed)
[facility] - DISCHARGE INSTRUCTIONS TO PATIENT  Activity:  Driving - May drive tomorrow, if doing well   Lifting - No lifting more than 15 pounds for 4 weeks.  Wound Care:   Remove bandage and shower tomorrow  Diet:  As tolerated  Follow up appointment:  Call Dr. Pollie Friar office [facility]) at [phone] for an appointment in 2 to 3 weks  Medications and dosages:  Resume your home medications.  You have a prescription for:  Vicodin             You may resume Coumadin today - at regular dose  Call Dr. Lucia Gaskins or his office  [phone]) if you have:  Temperature greater than 100.4,  Persistent nausea and vomiting,  Severe uncontrolled pain,  Redness, tenderness, or signs of infection (pain, swelling, redness, odor or green/yellow discharge around the site),  Difficulty breathing, headache or visual disturbances,  Any other questions or concerns you may have after discharge.  In an emergency, call 911 or go to an Emergency Department at a nearby hospital.

## 2017-08-22 ENCOUNTER — Inpatient Hospital Stay (HOSPITAL_COMMUNITY): Payer: Medicare Other

## 2017-08-22 ENCOUNTER — Encounter (HOSPITAL_COMMUNITY)
Admission: RE | Disposition: A | Discharge status: Home / Self Care | Payer: Self-pay | Source: Ambulatory Visit | Attending: Surgery

## 2017-08-22 DIAGNOSIS — I214 Non-ST elevation (NSTEMI) myocardial infarction: Secondary | ICD-10-CM

## 2017-08-22 DIAGNOSIS — I361 Nonrheumatic tricuspid (valve) insufficiency: Secondary | ICD-10-CM

## 2017-08-22 DIAGNOSIS — N183 Chronic kidney disease, stage 3 (moderate): Secondary | ICD-10-CM

## 2017-08-22 DIAGNOSIS — I4892 Unspecified atrial flutter: Secondary | ICD-10-CM

## 2017-08-22 HISTORY — PX: LEFT HEART CATH AND CORS/GRAFTS ANGIOGRAPHY: CATH118250

## 2017-08-22 LAB — CBC WITH DIFFERENTIAL/PLATELET
BASOS ABS: 0 10*3/uL (ref 0.0–0.1)
Basophils Relative: 0 %
EOS PCT: 2 %
Eosinophils Absolute: 0.2 10*3/uL (ref 0.0–0.7)
HCT: 35.8 % — ABNORMAL LOW (ref 39.0–52.0)
HEMOGLOBIN: 11.7 g/dL — AB (ref 13.0–17.0)
LYMPHS ABS: 1.6 10*3/uL (ref 0.7–4.0)
LYMPHS PCT: 14 %
MCH: 31.3 pg (ref 26.0–34.0)
MCHC: 32.7 g/dL (ref 30.0–36.0)
MCV: 95.7 fL (ref 78.0–100.0)
Monocytes Absolute: 0.8 10*3/uL (ref 0.1–1.0)
Monocytes Relative: 7 %
NEUTROS ABS: 8.8 10*3/uL — AB (ref 1.7–7.7)
NEUTROS PCT: 77 %
PLATELETS: 127 10*3/uL — AB (ref 150–400)
RBC: 3.74 MIL/uL — AB (ref 4.22–5.81)
RDW: 14.4 % (ref 11.5–15.5)
WBC: 11.4 10*3/uL — AB (ref 4.0–10.5)

## 2017-08-22 LAB — BASIC METABOLIC PANEL
Anion gap: 5 (ref 5–15)
BUN: 36 mg/dL — AB (ref 6–20)
CHLORIDE: 105 mmol/L (ref 101–111)
CO2: 28 mmol/L (ref 22–32)
Calcium: 8.6 mg/dL — ABNORMAL LOW (ref 8.9–10.3)
Creatinine, Ser: 1.89 mg/dL — ABNORMAL HIGH (ref 0.61–1.24)
GFR calc Af Amer: 40 mL/min — ABNORMAL LOW (ref >60)
GFR, EST NON AFRICAN AMERICAN: 35 mL/min — AB (ref >60)
Glucose, Bld: 126 mg/dL — ABNORMAL HIGH (ref 65–99)
Potassium: 4.7 mmol/L (ref 3.5–5.1)
SODIUM: 138 mmol/L (ref 135–145)

## 2017-08-22 LAB — PROTIME-INR
INR: 1.03
Prothrombin Time: 13.4 seconds (ref 11.4–15.2)

## 2017-08-22 LAB — DIGOXIN LEVEL: Digoxin Level: 0.4 ng/mL — ABNORMAL LOW (ref 0.8–2.0)

## 2017-08-22 LAB — TROPONIN I: Troponin I: 8.67 ng/mL (ref <0.03)

## 2017-08-22 LAB — GLUCOSE, CAPILLARY
GLUCOSE-CAPILLARY: 136 mg/dL — AB (ref 65–99)
Glucose-Capillary: 95 mg/dL (ref 65–99)
Glucose-Capillary: 151 mg/dL — ABNORMAL HIGH (ref 65–99)

## 2017-08-22 LAB — ECHOCARDIOGRAM COMPLETE
HEIGHTINCHES: 72 in
WEIGHTICAEL: 2872 [oz_av]

## 2017-08-22 SURGERY — LEFT HEART CATH AND CORS/GRAFTS ANGIOGRAPHY
Anesthesia: LOCAL

## 2017-08-22 MED ORDER — ACETAMINOPHEN 325 MG PO TABS: 650.0000 mg | ORAL_TABLET | ORAL | Status: DC | PRN

## 2017-08-22 MED ORDER — HEPARIN SODIUM (PORCINE) 1000 UNIT/ML IJ SOLN
INTRAMUSCULAR | Status: DC | PRN
  Administered 2017-08-22: 4000 [IU] via INTRAVENOUS

## 2017-08-22 MED ORDER — IOPAMIDOL (ISOVUE-370) INJECTION 76%
INTRAVENOUS | Status: AC
  Filled 2017-08-22: qty 100

## 2017-08-22 MED ORDER — SODIUM CHLORIDE 0.9 % IV SOLN: INTRAVENOUS | Status: DC

## 2017-08-22 MED ORDER — LINAGLIPTIN 5 MG PO TABS
5.0000 mg | ORAL_TABLET | Freq: Every day | ORAL | Status: DC
  Administered 2017-08-23: 5 mg via ORAL
  Filled 2017-08-22: qty 1

## 2017-08-22 MED ORDER — HEPARIN (PORCINE) IN NACL 2-0.9 UNIT/ML-% IJ SOLN
INTRAMUSCULAR | Status: AC | PRN
  Administered 2017-08-22: 1000 mL

## 2017-08-22 MED ORDER — LIDOCAINE HCL (PF) 1 % IJ SOLN
INTRAMUSCULAR | Status: AC
  Filled 2017-08-22: qty 30

## 2017-08-22 MED ORDER — SODIUM CHLORIDE 0.9 % IV SOLN: 250.0000 mL | INTRAVENOUS | Status: DC | PRN

## 2017-08-22 MED ORDER — SODIUM CHLORIDE 0.9 % WEIGHT BASED INFUSION: 3.0000 mL/kg/h | INTRAVENOUS | Status: AC

## 2017-08-22 MED ORDER — IOPAMIDOL (ISOVUE-370) INJECTION 76%
INTRAVENOUS | Status: DC | PRN
Start: 2017-08-22 — End: 2017-08-22
  Administered 2017-08-22: 70 mL via INTRA_ARTERIAL

## 2017-08-22 MED ORDER — ASPIRIN 81 MG PO CHEW
81.0000 mg | CHEWABLE_TABLET | ORAL | Status: AC
  Administered 2017-08-22: 81 mg via ORAL

## 2017-08-22 MED ORDER — WARFARIN SODIUM 7.5 MG PO TABS
7.5000 mg | ORAL_TABLET | Freq: Once | ORAL | Status: AC
  Administered 2017-08-22: 7.5 mg via ORAL
  Filled 2017-08-22 (×2): qty 1

## 2017-08-22 MED ORDER — SODIUM CHLORIDE 0.9% FLUSH: 3.0000 mL | INTRAVENOUS | Status: DC | PRN

## 2017-08-22 MED ORDER — FENTANYL CITRATE (PF) 100 MCG/2ML IJ SOLN
INTRAMUSCULAR | Status: AC
  Filled 2017-08-22: qty 2

## 2017-08-22 MED ORDER — SODIUM CHLORIDE 0.9 % WEIGHT BASED INFUSION
1.0000 mL/kg/h | INTRAVENOUS | Status: DC
  Administered 2017-08-22: 1 mL/kg/h via INTRAVENOUS

## 2017-08-22 MED ORDER — MIDAZOLAM HCL 2 MG/2ML IJ SOLN
INTRAMUSCULAR | Status: DC | PRN
Start: 2017-08-22 — End: 2017-08-22
  Administered 2017-08-22: 1 mg via INTRAVENOUS

## 2017-08-22 MED ORDER — SODIUM CHLORIDE 0.9 % IV SOLN: INTRAVENOUS | Status: AC

## 2017-08-22 MED ORDER — HEPARIN (PORCINE) IN NACL 2-0.9 UNIT/ML-% IJ SOLN
INTRAMUSCULAR | Status: DC | PRN
  Administered 2017-08-22: 11:00:00 via INTRA_ARTERIAL

## 2017-08-22 MED ORDER — LIDOCAINE HCL (PF) 1 % IJ SOLN
INTRAMUSCULAR | Status: DC | PRN
  Administered 2017-08-22: 2 mL

## 2017-08-22 MED ORDER — FENTANYL CITRATE (PF) 100 MCG/2ML IJ SOLN
INTRAMUSCULAR | Status: DC | PRN
  Administered 2017-08-22: 25 ug via INTRAVENOUS

## 2017-08-22 MED ORDER — HEPARIN (PORCINE) IN NACL 2-0.9 UNIT/ML-% IJ SOLN
INTRAMUSCULAR | Status: AC
  Filled 2017-08-22: qty 1000

## 2017-08-22 MED ORDER — ONDANSETRON HCL 4 MG/2ML IJ SOLN: 4.0000 mg | Freq: Four times a day (QID) | INTRAMUSCULAR | Status: DC | PRN

## 2017-08-22 MED ORDER — CARVEDILOL 6.25 MG PO TABS
9.3750 mg | ORAL_TABLET | Freq: Two times a day (BID) | ORAL | Status: DC
  Administered 2017-08-22 – 2017-08-23 (×2): 9.375 mg via ORAL
  Filled 2017-08-22 (×2): qty 1

## 2017-08-22 MED ORDER — WARFARIN - PHARMACIST DOSING INPATIENT: Freq: Every day | Status: DC

## 2017-08-22 MED ORDER — SODIUM CHLORIDE 0.9% FLUSH
3.0000 mL | Freq: Two times a day (BID) | INTRAVENOUS | Status: DC
  Administered 2017-08-23: 3 mL via INTRAVENOUS

## 2017-08-22 MED ORDER — SODIUM CHLORIDE 0.9% FLUSH: 3.0000 mL | Freq: Two times a day (BID) | INTRAVENOUS | Status: DC

## 2017-08-22 MED ORDER — VERAPAMIL HCL 2.5 MG/ML IV SOLN
INTRAVENOUS | Status: AC
  Filled 2017-08-22: qty 2

## 2017-08-22 MED ORDER — MIDAZOLAM HCL 2 MG/2ML IJ SOLN
INTRAMUSCULAR | Status: AC
  Filled 2017-08-22: qty 2

## 2017-08-22 SURGICAL SUPPLY — 10 items
CATH EXPO 5F MPA-1 (CATHETERS) ×2 IMPLANT
CATH INFINITI 5FR MULTPACK ANG (CATHETERS) ×1 IMPLANT
DEVICE RAD COMP TR BAND LRG (VASCULAR PRODUCTS) ×1 IMPLANT
GLIDESHEATH SLEND SS 6F .021 (SHEATH) ×1 IMPLANT
GUIDEWIRE INQWIRE 1.5J.035X260 (WIRE) IMPLANT
INQWIRE 1.5J .035X260CM (WIRE) ×2
KIT HEART LEFT (KITS) ×2 IMPLANT
PACK CARDIAC CATHETERIZATION (CUSTOM PROCEDURE TRAY) ×2 IMPLANT
TRANSDUCER W/STOPCOCK (MISCELLANEOUS) ×2 IMPLANT
TUBING CIL FLEX 10 FLL-RA (TUBING) ×2 IMPLANT

## 2017-08-22 NOTE — Progress Notes (Signed)
  Echocardiogram 2D Echocardiogram has been performed.  Christian Bowman 08/22/2017, 1:52 PM

## 2017-08-22 NOTE — Interval H&P Note (Signed)
History and Physical Interval Note:  08/22/2017 11:00 AM  Christian Bowman  has presented today for surgery, with the diagnosis of cp  The various methods of treatment have been discussed with the patient and family. After consideration of risks, benefits and other options for treatment, the patient has consented to  Procedure(s): LEFT HEART CATH AND CORS/GRAFTS ANGIOGRAPHY (N/A) as a surgical intervention .  The patient's history has been reviewed, patient examined, no change in status, stable for surgery.  I have reviewed the patient's chart and labs.  Questions were answered to the patient's satisfaction.     Christian Bowman

## 2017-08-22 NOTE — Progress Notes (Signed)
Nutrition Brief Note  Patient identified on the Malnutrition Screening Tool (MST) Report  Wt Readings from Last 15 Encounters:  08/22/17 179 lb 8 oz (81.4 kg)  08/17/17 173 lb (78.5 kg)  08/17/17 173 lb 3.2 oz (78.6 kg)  06/25/17 169 lb (76.7 kg)  06/25/17 169 lb (76.7 kg)  06/13/17 170 lb 6.4 oz (77.3 kg)  05/11/17 172 lb (78 kg)  04/19/17 172 lb (78 kg)  04/17/17 171 lb 4 oz (77.7 kg)  03/09/17 173 lb 3.2 oz (78.6 kg)  01/23/17 174 lb 12.8 oz (79.3 kg)  01/11/17 171 lb (77.6 kg)  12/22/16 179 lb (81.2 kg)  12/19/16 174 lb 14.4 oz (79.3 kg)  10/26/16 173 lb 6.4 oz (78.7 kg)   S/p Procedure(s) 08/20/17: OPEN LEFT INGUINAL HERNIA REPAIR (Left)  Attempted to speak with pt x 2 within the past 24 hours. Pt currently in cath lab. Plan was for pt to discharge yesterday, however, pt found with chest pain and elevated troponin. Pt for cardiac cath today.   Reviewed wt hx; wt has been stable over the past year.   Pt with very good glycemic control (last Hgb A1c: 6.0). Pt is followed by Dr. Dorris Fetch (endocrinologist) as an outpatient.   Body mass index is 24.34 kg/m. Patient meets criteria for normal weight range based on current BMI.   Current diet order is NPO (previously Heart Healthy), patient is consuming approximately 50% of meals at this time. Labs and medications reviewed.   No nutrition interventions warranted at this time. If nutrition issues arise, please consult RD.   Tehila Sokolow A. Jimmye Norman, RD, LDN, CDE Pager: [phone] After hours Pager: [phone]

## 2017-08-22 NOTE — Progress Notes (Signed)
Received  From Cath lab,  Right radial gauge/coban dressing  CDI, level 0.Patient alert and oriented, no complaints of any pain or discomfort.

## 2017-08-22 NOTE — Progress Notes (Signed)
TR BAND REMOVAL  LOCATION:   Left radial  DEFLATED PER PROTOCOL:    Yes.    TIME BAND OFF / DRESSING APPLIED:    1515p Clean dressing with coban   SITE UPON ARRIVAL:    Level 0  SITE AFTER BAND REMOVAL:    Level 0  CIRCULATION SENSATION AND MOVEMENT:    Within Normal Limits   Yes.    COMMENTS:   Pt denies any discomfort at site.

## 2017-08-22 NOTE — Progress Notes (Signed)
Patient ID: Christian Bowman, male   DOB: [birth year], 71 y.o.   MRN: [id number]     Advanced Heart Failure Rounding Note   Primary Cardiologist: Croitoru/Sarkis Rhines  Subjective:    No further chest pain but troponin rose to 12.3.  This am, no complaints.  Surgical site stable.  No dyspnea.    Objective:   Weight Range: 173 lb (78.5 kg) Body mass index is 23.46 kg/m.   Vital Signs:   Temp:  [98 F (36.7 C)-98.6 F (37 C)] 98.6 F (37 C) (11/21 0446) Pulse Rate:  [62-96] 62 (11/21 0446) Resp:  [18] 18 (11/21 0446) BP: (97-152)/(47-89) 97/47 (11/21 0446) SpO2:  [96 %-99 %] 97 % (11/21 0446) Last BM Date: 08/21/17  Weight change: Filed Weights   08/20/17 0830  Weight: 173 lb (78.5 kg)    Intake/Output:   Intake/Output Summary (Last 24 hours) at 08/22/2017 0742 Last data filed at 08/22/2017 0452 Gross per 24 hour  Intake 240 ml  Output 775 ml  Net -535 ml      Physical Exam    General:  Well appearing. No resp difficulty HEENT: Normal Neck: Supple. JVP not elevated. Carotids 2+ bilat; no bruits. No lymphadenopathy or thyromegaly appreciated. Cor: PMI nondisplaced. Regular rate & rhythm. No rubs, gallops or murmurs. Lungs: Clear Abdomen: Soft, nontender, nondistended. No hepatosplenomegaly. No bruits or masses. Good bowel sounds. Extremities: No cyanosis, clubbing, rash, edema Neuro: Alert & orientedx3, cranial nerves grossly intact. moves all 4 extremities w/o difficulty. Affect pleasant   Telemetry   A- BiV pacing (personally reviewed)  Labs    CBC No results for input(s): WBC, NEUTROABS, HGB, HCT, MCV, PLT in the last 72 hours. Basic Metabolic Panel No results for input(s): NA, K, CL, CO2, GLUCOSE, BUN, CREATININE, CALCIUM, MG, PHOS in the last 72 hours. Liver Function Tests No results for input(s): AST, ALT, ALKPHOS, BILITOT, PROT, ALBUMIN in the last 72 hours. No results for input(s): LIPASE, AMYLASE in the last 72 hours. Cardiac Enzymes Recent Labs   08/21/17 1115 08/21/17 1344 08/21/17 1947  TROPONINI 0.12* 1.23* 12.27*    BNP: BNP (last 3 results) Recent Labs    09/18/16 1232 10/16/16 1239 12/15/16 0953  BNP 3,909.3* 3,654.9* >4,500.0*    ProBNP (last 3 results) No results for input(s): PROBNP in the last 8760 hours.   D-Dimer No results for input(s): DDIMER in the last 72 hours. Hemoglobin A1C No results for input(s): HGBA1C in the last 72 hours. Fasting Lipid Panel No results for input(s): CHOL, HDL, LDLCALC, TRIG, CHOLHDL, LDLDIRECT in the last 72 hours. Thyroid Function Tests No results for input(s): TSH, T4TOTAL, T3FREE, THYROIDAB in the last 72 hours.  Invalid input(s): FREET3  Other results:   Imaging     No results found.   Medications:     Scheduled Medications: . aspirin  81 mg Oral Daily  . carvedilol  12.5 mg Oral Daily  . carvedilol  9.375 mg Oral Daily  . digoxin  0.0625 mg Oral Daily  . feeding supplement (ENSURE ENLIVE)  237 mL Oral BID BM  . levothyroxine  75 mcg Oral QAC breakfast  . [START ON 08/23/2017] linagliptin  5 mg Oral Daily  . rosuvastatin  10 mg Oral q1800     Infusions: . heparin 1,000 Units/hr (08/21/17 2122)     PRN Medications:  diazepam, HYDROcodone-acetaminophen, morphine injection, nitroGLYCERIN, ondansetron **OR** ondansetron (ZOFRAN) IV, traMADol    Patient Profile   Christian Bowman is a 71 y.o. male  with history of CAD s/p CABG in 2007 then redo CABG in 10/17 with mitral valve replacement presents for cardiology followup. He was admitted in 10/17 for redo CABG with SVG-PDA and SVG-ramus. He also had mitral valve replacement with a bioprosthetic valve because of infarct-related mitral regurgitation.   CHF team asked to see after post-op chest pain on 11/20. Inguinal repair underwent 19/37/90 without complication.   Assessment/Plan   1. NSTEMI: Known CAD s/p redo CABG.  He had chest pain yesterday while at rest, resolved with 2 NTG.  Troponin after  this rose up to 12.3.  He has had no further chest pain.  Of note, he had been hypotensive the night prior.  Last cath in 3/18 with NSTEMI and chest pain though troponin not as high => no intervention.  He has a stable pattern of chest tightness typically that occurs with stress and not with exertion. He takes 6-8 NTGs/month.  - Continue statin and ASA. He is on a heparin gtt.  - Discussed catheterization with patient today.  He understands risks/benefits and agrees to proceed.  I will make sure Dr. Lucia Gaskins is ok with this in terms of surgical site.  2. Chronic systolic CHF: Ischemic cardiomyopathy. TEE 10/17 with EF 20-25%. Has Medtronic CRT-D system. Most recent echo from [facility] in 3/18 with EF 30-35%.  He is not volume overloaded on exam.  - Will hold Entresto, spironolactone, and Lasix this morning pre-cath.  -Continue Coreg to 9.375 mg qam/12.5 mg qpm and digoxin, check digoxin level today.  - Repeat echo given NSTEMI. 3. Bioprosthetic mitral valve:  Stable on 3/18 echo from [place]. No change.  4. Atrial flutter:s/p ablation in 8/18. Remains in NSR off amio.  - Will need to restart anticoagulation eventually, has been on warfarin at home.  5. CKD: Stage III. Still waiting for BMET this morning.  - He will need hydration pre-cath given baseline CKD.  6. Inguinal hernia: s/p repair uncomplicated repair 24/09/73.   Length of Stay: 1  Loralie Champagne, MD  08/22/2017, 7:42 AM  Advanced Heart Failure Team Pager [phone] (M-F; 7a - 4p)  Please contact [facility] for night-coverage after hours (4p -7a ) and weekends on [email or web address]

## 2017-08-22 NOTE — Progress Notes (Signed)
ANTICOAGULATION CONSULT NOTE - Follow Up Consult  Pharmacy Consult for Coumdain Indication: atrial fibrillation  Allergies  Allergen Reactions  . Xanax [Alprazolam] Other (See Comments)    Pt feels very weak, tired and feels paralyzed      Patient Measurements: Height: 6' (182.9 cm) Weight: 179 lb 8 oz (81.4 kg) IBW/kg (Calculated) : 77.6  Vital Signs: Temp: 97.7 F (36.5 C) (11/21 1013) Temp Source: Oral (11/21 1013) BP: 129/56 (11/21 1145) Pulse Rate: 60 (11/21 1145)  Labs: Recent Labs    08/21/17 1344 08/21/17 1947 08/22/17 0735 08/22/17 0738  HGB  --   --  11.7*  --   HCT  --   --  35.8*  --   PLT  --   --  127*  --   LABPROT  --   --  13.4  --   INR  --   --  1.03  --   HEPARINUNFRC  --  0.25*  --   --   CREATININE  --   --  1.89*  --   TROPONINI 1.23* 12.27*  --  8.67*    Estimated Creatinine Clearance: 40.5 mL/min (A) (by C-G formula based on SCr of 1.89 mg/dL (H)).  Assessment: 71yom on coumadin pta for afib which had been on hold pending hernia repair surgery. S/P surgery 11/19, then had an NSTEMI and was started on heparin. S/P cath today found to have a PLV branch occlusion but no intervention made. Coumadin to resume post-cath without heparin bridge per Dr. Aundra Dubin. INR 1. Hgb 11.7, platelets 127 - low but stable.   PTA dose: 5mg  daily except 7.5mg  MWF  Goal of Therapy:  INR 2-3 Monitor platelets by anticoagulation protocol: Yes   Plan:  1) Coumadin 7.5mg  tonight 2) Daily INR  Deboraha Sprang 08/22/2017,11:59 AM

## 2017-08-22 NOTE — Progress Notes (Signed)
Patient was prepped for cath lab. Patient denied any pain at this time. Patient in stable condition for transport to cath lab.

## 2017-08-22 NOTE — H&P (View-Only) (Signed)
Patient ID: Christian Bowman, male   DOB: [birth year], 71 y.o.   MRN: [id number]     Advanced Heart Failure Rounding Note   Primary Cardiologist: Croitoru/Cecilie Heidel  Subjective:    No further chest pain but troponin rose to 12.3.  This am, no complaints.  Surgical site stable.  No dyspnea.    Objective:   Weight Range: 173 lb (78.5 kg) Body mass index is 23.46 kg/m.   Vital Signs:   Temp:  [98 F (36.7 C)-98.6 F (37 C)] 98.6 F (37 C) (11/21 0446) Pulse Rate:  [62-96] 62 (11/21 0446) Resp:  [18] 18 (11/21 0446) BP: (97-152)/(47-89) 97/47 (11/21 0446) SpO2:  [96 %-99 %] 97 % (11/21 0446) Last BM Date: 08/21/17  Weight change: Filed Weights   08/20/17 0830  Weight: 173 lb (78.5 kg)    Intake/Output:   Intake/Output Summary (Last 24 hours) at 08/22/2017 0742 Last data filed at 08/22/2017 0452 Gross per 24 hour  Intake 240 ml  Output 775 ml  Net -535 ml      Physical Exam    General:  Well appearing. No resp difficulty HEENT: Normal Neck: Supple. JVP not elevated. Carotids 2+ bilat; no bruits. No lymphadenopathy or thyromegaly appreciated. Cor: PMI nondisplaced. Regular rate & rhythm. No rubs, gallops or murmurs. Lungs: Clear Abdomen: Soft, nontender, nondistended. No hepatosplenomegaly. No bruits or masses. Good bowel sounds. Extremities: No cyanosis, clubbing, rash, edema Neuro: Alert & orientedx3, cranial nerves grossly intact. moves all 4 extremities w/o difficulty. Affect pleasant   Telemetry   A- BiV pacing (personally reviewed)  Labs    CBC No results for input(s): WBC, NEUTROABS, HGB, HCT, MCV, PLT in the last 72 hours. Basic Metabolic Panel No results for input(s): NA, K, CL, CO2, GLUCOSE, BUN, CREATININE, CALCIUM, MG, PHOS in the last 72 hours. Liver Function Tests No results for input(s): AST, ALT, ALKPHOS, BILITOT, PROT, ALBUMIN in the last 72 hours. No results for input(s): LIPASE, AMYLASE in the last 72 hours. Cardiac Enzymes Recent Labs   08/21/17 1115 08/21/17 1344 08/21/17 1947  TROPONINI 0.12* 1.23* 12.27*    BNP: BNP (last 3 results) Recent Labs    09/18/16 1232 10/16/16 1239 12/15/16 0953  BNP 3,909.3* 3,654.9* >4,500.0*    ProBNP (last 3 results) No results for input(s): PROBNP in the last 8760 hours.   D-Dimer No results for input(s): DDIMER in the last 72 hours. Hemoglobin A1C No results for input(s): HGBA1C in the last 72 hours. Fasting Lipid Panel No results for input(s): CHOL, HDL, LDLCALC, TRIG, CHOLHDL, LDLDIRECT in the last 72 hours. Thyroid Function Tests No results for input(s): TSH, T4TOTAL, T3FREE, THYROIDAB in the last 72 hours.  Invalid input(s): FREET3  Other results:   Imaging     No results found.   Medications:     Scheduled Medications: . aspirin  81 mg Oral Daily  . carvedilol  12.5 mg Oral Daily  . carvedilol  9.375 mg Oral Daily  . digoxin  0.0625 mg Oral Daily  . feeding supplement (ENSURE ENLIVE)  237 mL Oral BID BM  . levothyroxine  75 mcg Oral QAC breakfast  . [START ON 08/23/2017] linagliptin  5 mg Oral Daily  . rosuvastatin  10 mg Oral q1800     Infusions: . heparin 1,000 Units/hr (08/21/17 2122)     PRN Medications:  diazepam, HYDROcodone-acetaminophen, morphine injection, nitroGLYCERIN, ondansetron **OR** ondansetron (ZOFRAN) IV, traMADol    Patient Profile   Christian Bowman is a 71 y.o. male  with history of CAD s/p CABG in 2007 then redo CABG in 10/17 with mitral valve replacement presents for cardiology followup. He was admitted in 10/17 for redo CABG with SVG-PDA and SVG-ramus. He also had mitral valve replacement with a bioprosthetic valve because of infarct-related mitral regurgitation.   CHF team asked to see after post-op chest pain on 11/20. Inguinal repair underwent 56/43/32 without complication.   Assessment/Plan   1. NSTEMI: Known CAD s/p redo CABG.  He had chest pain yesterday while at rest, resolved with 2 NTG.  Troponin after  this rose up to 12.3.  He has had no further chest pain.  Of note, he had been hypotensive the night prior.  Last cath in 3/18 with NSTEMI and chest pain though troponin not as high => no intervention.  He has a stable pattern of chest tightness typically that occurs with stress and not with exertion. He takes 6-8 NTGs/month.  - Continue statin and ASA. He is on a heparin gtt.  - Discussed catheterization with patient today.  He understands risks/benefits and agrees to proceed.  I will make sure Dr. Lucia Gaskins is ok with this in terms of surgical site.  2. Chronic systolic CHF: Ischemic cardiomyopathy. TEE 10/17 with EF 20-25%. Has Medtronic CRT-D system. Most recent echo from [facility] in 3/18 with EF 30-35%.  He is not volume overloaded on exam.  - Will hold Entresto, spironolactone, and Lasix this morning pre-cath.  -Continue Coreg to 9.375 mg qam/12.5 mg qpm and digoxin, check digoxin level today.  - Repeat echo given NSTEMI. 3. Bioprosthetic mitral valve:  Stable on 3/18 echo from [place]. No change.  4. Atrial flutter:s/p ablation in 8/18. Remains in NSR off amio.  - Will need to restart anticoagulation eventually, has been on warfarin at home.  5. CKD: Stage III. Still waiting for BMET this morning.  - He will need hydration pre-cath given baseline CKD.  6. Inguinal hernia: s/p repair uncomplicated repair 95/18/84.   Length of Stay: 1  Loralie Champagne, MD  08/22/2017, 7:42 AM  Advanced Heart Failure Team Pager [phone] (M-F; 7a - 4p)  Please contact [facility] for night-coverage after hours (4p -7a ) and weekends on [email or web address]

## 2017-08-22 NOTE — Progress Notes (Addendum)
[facility] Office:  [phone] General Surgery Progress Note   LOS: 1 day  POD -  2 Days Post-Op  Chief Complaint: Left inguinal hernia  Assessment and Plan: 1.  OPEN LEFT INGUINAL HERNIA REPAIR - 08/20/2017 - D. Jmari Pelc  Wound okay  May shower - if no intervention with cardiology.  2.  Chest pain/elevated troponins  Seen by Dr. Aundra Dubin   Troponin - 12.27 - 08/21/2017  Now NPO - await cardiology plans  Cath today by Dr. Aundra Dubin - occlusion of PLV branch.  No interventional option.  The patient looks good tonight, eating supper.  Probably home in AM.   3. Significant cardiac disease        Sees Dr. Sallyanne Kuster and Dr. Aundra Dubin        CABG - 2007        Redo coronary artery bypass grafting 2 and mitral valve replacement using a bioprosthetic tissue valve on 07/11/2016 by Dr. Sherrin Daisy - some trouble with CHF post op        NSTEMI - 12/17/2016         He underwent catheter-based ablation for atrial flutter on 05/11/2017 - Dr. Allegra Lai          EF (07/18/2016) - 20-25% 4. Anticoagulated on heparin              Heparin - 08/21/2017 - 0.25 5. Left chest pacemaker/defib  6. DM - sees Dr. Dorris Fetch  much better with weight loss 7. Right neck mass - aspirated once 8. Intentional weight loss 9. CKD - stage III 10.  DVT prophylaxis - Now on IV heparin   Active Problems:   Chronic systolic CHF (congestive heart failure) (HCC)   Inguinal hernia of left side without obstruction or gangrene   Angina decubitus (HCC)   Non-ST elevation (NSTEMI) myocardial infarction [facility])   Subjective:  Doing okay over the last 24 hours - no more chest pain.  Has gotten up and walked.  No nausea.    Wife at bedside.  Objective:   Vitals:   08/21/17 2255 08/22/17 0446  BP: 112/71 (!) 97/47  Pulse: 69 62  Resp: 18 18  Temp: 98.4 F (36.9 C) 98.6 F (37 C)  SpO2: 96% 97%     Intake/Output from previous day:  11/20 0701 - 11/21 0700 In: 240  [P.O.:240] Out: 775 [Urine:775]  Intake/Output this shift:  No intake/output data recorded.   Physical Exam:   General: WN Older WM who is alert and oriented.    HEENT: Normal. Pupils equal.  Has cystic structure on right neck .   Lungs: Clear   Abdomen: Soft.  Has BS.   Wound: Clean.   Lab Results:   No results for input(s): WBC, HGB, HCT, PLT in the last 72 hours.  BMET  No results for input(s): NA, K, CL, CO2, GLUCOSE, BUN, CREATININE, CALCIUM in the last 72 hours.  PT/INR  No results for input(s): LABPROT, INR in the last 72 hours.  ABG  No results for input(s): PHART, HCO3 in the last 72 hours.  Invalid input(s): PCO2, PO2   Studies/Results:  No results found.   Anti-infectives:   Anti-infectives (From admission, onward)   Start     Dose/Rate Route Frequency Ordered Stop   08/20/17 0907  ceFAZolin (ANCEF) IVPB 2g/100 mL premix     2 g 200 mL/hr over 30 Minutes Intravenous On call to O.R. 08/20/17 0907 08/20/17 [place],  MD, FACS Pager: [facility] Office: [phone] 08/22/2017

## 2017-08-23 LAB — GLUCOSE, CAPILLARY
GLUCOSE-CAPILLARY: 92 mg/dL (ref 65–99)
GLUCOSE-CAPILLARY: 103 mg/dL — AB (ref 65–99)

## 2017-08-23 LAB — CBC
HEMATOCRIT: 36.6 % — AB (ref 39.0–52.0)
Hemoglobin: 11.8 g/dL — ABNORMAL LOW (ref 13.0–17.0)
MCH: 30.7 pg (ref 26.0–34.0)
MCHC: 32.2 g/dL (ref 30.0–36.0)
MCV: 95.3 fL (ref 78.0–100.0)
Platelets: 120 10*3/uL — ABNORMAL LOW (ref 150–400)
RBC: 3.84 MIL/uL — ABNORMAL LOW (ref 4.22–5.81)
RDW: 14.1 % (ref 11.5–15.5)
WBC: 8.1 10*3/uL (ref 4.0–10.5)

## 2017-08-23 LAB — BASIC METABOLIC PANEL
ANION GAP: 5 (ref 5–15)
BUN: 28 mg/dL — ABNORMAL HIGH (ref 6–20)
CALCIUM: 8.3 mg/dL — AB (ref 8.9–10.3)
CO2: 27 mmol/L (ref 22–32)
CREATININE: 1.63 mg/dL — AB (ref 0.61–1.24)
Chloride: 106 mmol/L (ref 101–111)
GFR, EST AFRICAN AMERICAN: 48 mL/min — AB (ref >60)
GFR, EST NON AFRICAN AMERICAN: 41 mL/min — AB (ref >60)
Glucose, Bld: 120 mg/dL — ABNORMAL HIGH (ref 65–99)
Potassium: 4.9 mmol/L (ref 3.5–5.1)
SODIUM: 138 mmol/L (ref 135–145)

## 2017-08-23 LAB — PROTIME-INR
INR: 1.05
PROTHROMBIN TIME: 13.6 s (ref 11.4–15.2)

## 2017-08-23 MED ORDER — SACUBITRIL-VALSARTAN 24-26 MG PO TABS: 1.0000 | ORAL_TABLET | Freq: Two times a day (BID) | ORAL | 11 refills | Status: DC

## 2017-08-23 MED ORDER — WARFARIN SODIUM 7.5 MG PO TABS: 7.5000 mg | ORAL_TABLET | ORAL | Status: DC | PRN

## 2017-08-23 MED ORDER — SACUBITRIL-VALSARTAN 24-26 MG PO TABS
1.0000 | ORAL_TABLET | Freq: Two times a day (BID) | ORAL | Status: DC
  Filled 2017-08-23: qty 1

## 2017-08-23 MED ORDER — CARVEDILOL 6.25 MG PO TABS: ORAL_TABLET | ORAL | 11 refills | Status: DC

## 2017-08-23 MED ORDER — SPIRONOLACTONE 25 MG PO TABS
12.5000 mg | ORAL_TABLET | Freq: Every day | ORAL | Status: DC
  Filled 2017-08-23: qty 1

## 2017-08-23 NOTE — Discharge Summary (Signed)
Discharge Summary    Patient ID: Christian Bowman,  MRN: [id number], DOB/AGE: January 28, 1946 71 y.o.  Admit date: 08/20/2017 Discharge date: 08/23/2017  Primary Care Provider: Sharilyn Sites Primary Cardiologist: Dr. Aundra Dubin  Discharge Diagnoses    Active Problems:   Chronic systolic CHF (congestive heart failure) ([place])   Inguinal hernia of left side without obstruction or gangrene   Angina decubitus (HCC)   Non-ST elevation (NSTEMI) myocardial infarction [facility])   History of Present Illness     Christian Bowman is a 71 y.o. male with past medical history of CAD (s/p CABG in 2007 then redo CABG in 70/3500), chronic systolic CHF (EF 93-81%), ischemic cardiomyopathy (s/p Medtronic ICD placement), MVR in 07/2016 with bioprosthetic tissue valve, HTN, HLD, Hypothyroidism, and Type 2 DM who presented to [facility] on 08/20/2017 for planned inguinal hernia repair surgery.   He underwent left inguinal repair on 82/99/37 without complication. He was initially to be discharged the following morning but developed an episode of chest pain which resolved with SL NTG, therefore Cardiology was consulted for further evaluation.   Initial troponin was found to be elevated to 0.12, therefore he was continued on Heparin with Coumadin held in case enzymes trended upwards and a cardiac catheterization was needed.   Hospital Course     Consultants: General Surgery (initially admitted the patient --> transferred to Cardiology service on 11/20)  The following morning, he denied any recurrent chest pain but cardiac enzymes had trended upwards to 12.27. Therefore, a cardiac catheterization was recommended for definitive evaluation. This showed an occlusion of the PLV branch which was thought to be the culprit lesion for his NSTEMI. This was back-filled from the SVG-PDA (based on prior cath images) and has been lost. The PLV had severe, diffuse disease on the last cath and no interventional options were noted.   On  08/23/2017, he denied any recurrent chest pain and reported breathing was at baseline. Was ambulating without difficulty. Coreg was reduced to 9.375mg  BID and Entresto to 24-26mg  BID secondary to his hypotension. He was last examined by Dr. Aundra Dubin and deemed stable for discharge. Follow-up has been arranged with the [facility] Failure [facility]. A staff message was sent to arrange for a close INR check as Coumadin was restarted on 11/21 following his cath. He was discharged home in good condition.   _____________  Discharge Vitals Blood pressure 127/62, pulse 65, temperature 98.3 F (36.8 C), temperature source Oral, resp. rate 18, height 6' (1.829 m), weight 180 lb 1.6 oz (81.7 kg), SpO2 99 %.  Filed Weights   08/22/17 1013 08/22/17 1528 08/23/17 0524  Weight: 179 lb 8 oz (81.4 kg) 179 lb 10.8 oz (81.5 kg) 180 lb 1.6 oz (81.7 kg)    Labs & Radiologic Studies     CBC Recent Labs    08/22/17 0735 08/23/17 0353  WBC 11.4* 8.1  NEUTROABS 8.8*  --   HGB 11.7* 11.8*  HCT 35.8* 36.6*  MCV 95.7 95.3  PLT 127* 169*   Basic Metabolic Panel Recent Labs    08/22/17 0735 08/23/17 0353  NA 138 138  K 4.7 4.9  CL 105 106  CO2 28 27  GLUCOSE 126* 120*  BUN 36* 28*  CREATININE 1.89* 1.63*  CALCIUM 8.6* 8.3*   Liver Function Tests No results for input(s): AST, ALT, ALKPHOS, BILITOT, PROT, ALBUMIN in the last 72 hours. No results for input(s): LIPASE, AMYLASE in the last 72 hours. Cardiac Enzymes Recent Labs  08/21/17 1344 08/21/17 1947 08/22/17 0738  TROPONINI 1.23* 12.27* 8.67*   BNP Invalid input(s): POCBNP D-Dimer No results for input(s): DDIMER in the last 72 hours. Hemoglobin A1C No results for input(s): HGBA1C in the last 72 hours. Fasting Lipid Panel No results for input(s): CHOL, HDL, LDLCALC, TRIG, CHOLHDL, LDLDIRECT in the last 72 hours. Thyroid Function Tests No results for input(s): TSH, T4TOTAL, T3FREE, THYROIDAB in the last 72 hours.  Invalid input(s):  FREET3  No results found.   Diagnostic Studies/Procedures     Cardiac Catheterization: 08/22/2017  Left Main  90% distal left main stenosis.  Left Anterior Descending  LAD was occluded proximally. Patient LIMA-LAD with occluded distal LAD after LIMA touchdown, this is similar to the prior cath. There is an occluded SVG to diagonal (known from prior cath).  Left Circumflex  Diffuse mild disease in the AV LCx. No large OMs noted, several small diffusely diseased OMs present. No patent vein graft to LCx territory.  Right Coronary Artery  Native RCA is known to be occluded, not injected. Occluded SVG-RCA system from CABG #1. SVG-PDA is patent, touches down on diffusely diseased PDA. On the last cath, this back-filled to a severely diseased PLV branch. On today's cath, this branch has been lost.    Suspect culprit lesion for NSTEMI is occlusion of a PLV branch.  This was back-filled from the SVG-PDA (based on prior cath images) and has been lost.  The PLV had severe, diffuse disease on the last cath.  There is no interventional option here.    Echocardiogram: 08/22/2017 Study Conclusions  - Left ventricle: The cavity size was moderately dilated. Wall   thickness was increased in a pattern of moderate LVH. Systolic   function was severely reduced. The estimated ejection fraction   was in the range of 20% to 25%. Diffuse hypokinesis. The study is   not technically sufficient to allow evaluation of LV diastolic   function. - Mitral valve: normal appearing bioprosthetic MV with no peri   valvular regurgitation. Valve area by pressure half-time: 1.46   cm^2. Valve area by continuity equation (using LVOT flow): 1.18   cm^2. - Atrial septum: No defect or patent foramen ovale was identified.    Disposition   Pt is being discharged home today in good condition.  Follow-up Plans & Appointments    Follow-up Information    [facility]. Go on  09/04/2017.   Specialty:  Cardiology Why:  11:30 AM, Advanced [facility], parking code [employer] information: [address] [id number] [place] [phone]         Discharge Instructions    Diet - low sodium heart healthy   Complete by:  As directed    Diet - low sodium heart healthy   Complete by:  As directed    Discharge instructions   Complete by:  As directed    PLEASE REMEMBER TO BRING ALL OF YOUR MEDICATIONS TO [place].  PLEASE ATTEND ALL SCHEDULED FOLLOW-UP APPOINTMENTS.   Activity: Increase activity slowly as tolerated. You may shower, but no soaking baths (or swimming) for 1 week. No driving for 24 hours. No lifting over 5 lbs for 1 week. No sexual activity for 1 week.   You May Return to Work: in 1 week (if applicable)  Wound Care: You may wash cath site gently with soap and water. Keep cath site clean and dry. If you notice pain,  swelling, bleeding or pus at your cath site, please call [phone].   Increase activity slowly   Complete by:  As directed    Increase activity slowly   Complete by:  As directed       Discharge Medications     Medication List    STOP taking these medications   diazepam 2 MG tablet Commonly known as:  VALIUM   sacubitril-valsartan 49-51 MG Commonly known as:  ENTRESTO Replaced by:  sacubitril-valsartan 24-26 MG     TAKE these medications   ACCU-CHEK SOFTCLIX LANCETS lancets Use as instructed. E11.65   amoxicillin 500 MG tablet Commonly known as:  AMOXIL Take 4 tablets (2,000 mg total) by mouth as needed. Pre-dental work only.   aspirin EC 81 MG tablet Take 81 mg by mouth daily.   carvedilol 6.25 MG tablet Commonly known as:  COREG Takes 1.5 tablets (9.375 mg) in AM and PM. What changed:  additional instructions   digoxin 0.125 MG tablet Commonly known as:  LANOXIN Take 0.5 tablets (0.0625 mg total) by mouth daily.   furosemide 20  MG tablet Commonly known as:  LASIX Take 1 tablet (20 mg total) by mouth every other day. Notes to patient:  Every other day    levothyroxine 75 MCG tablet Commonly known as:  SYNTHROID, LEVOTHROID Take 1 tablet (75 mcg total) by mouth daily before breakfast.   linagliptin 5 MG Tabs tablet Commonly known as:  TRADJENTA Take 1 tablet (5 mg total) by mouth daily.   neomycin-bacitracin-polymyxin ointment Commonly known as:  NEOSPORIN Apply 1 application topically daily as needed for wound care.   nitroGLYCERIN 0.4 MG SL tablet Commonly known as:  NITROSTAT Place 1 tablet (0.4 mg total) under the tongue every 5 (five) minutes x 3 doses as needed for chest pain.   polyethylene glycol packet Commonly known as:  MIRALAX / GLYCOLAX Take 17 g by mouth daily as needed for mild constipation or moderate constipation.   rosuvastatin 10 MG tablet Commonly known as:  CRESTOR Take 10 mgs by mouth once daily in the evening   sacubitril-valsartan 24-26 MG Commonly known as:  ENTRESTO Take 1 tablet by mouth 2 (two) times daily. Replaces:  sacubitril-valsartan 49-51 MG   spironolactone 25 MG tablet Commonly known as:  ALDACTONE Take 0.5 tablets (12.5 mg total) by mouth daily.   warfarin 5 MG tablet Commonly known as:  COUMADIN Take as directed. If you are unsure how to take this medication, talk to your nurse or doctor. Original instructions:  Take 5mg  by mouth once daily in the evening except Tuesdays and Saturdays take 7.5mg  once daily in the evening What changed:    how much to take  how to take this  when to take this  additional instructions       Aspirin prescribed at discharge?  Yes High Intensity Statin Prescribed? (Lipitor 40-80mg  or Crestor 20-40mg ): Yes Beta Blocker Prescribed? Yes For EF 40% or less, Was ACEI/ARB Prescribed? Yes ADP Receptor Inhibitor Prescribed? (i.e. Plavix etc.-Includes Medically Managed Patients): No: on Coumadin For EF <40%, Aldosterone  Inhibitor Prescribed? Yes Was EF assessed during THIS hospitalization? Yes Was Cardiac Rehab II ordered? (Included Medically managed Patients): Yes   Allergies Allergies  Allergen Reactions  . Xanax [Alprazolam] Other (See Comments)    Pt feels very weak, tired and feels paralyzed       Outstanding Labs/Studies   INR Check next week (Staff message sent to Coumadin Clinic)  Duration of Discharge Encounter  Greater than 30 minutes including physician time.  Signed, Erma Heritage, PA-C 08/23/2017, 11:44 AM

## 2017-08-23 NOTE — Progress Notes (Signed)
Pt discharge education completed by swat nurses, Mel. Pt IV discontinued, catheter intact and telemetry removed. Pt has all belongings and printed prescriptions. Pt discharged via wheelchair with nurse staff.

## 2017-08-23 NOTE — Progress Notes (Signed)
ANTICOAGULATION CONSULT NOTE - Follow Up Consult  Pharmacy Consult for Coumdain Indication: atrial fibrillation  Allergies  Allergen Reactions  . Xanax [Alprazolam] Other (See Comments)    Pt feels very weak, tired and feels paralyzed      Patient Measurements: Height: 6' (182.9 cm) Weight: 180 lb 1.6 oz (81.7 kg)(scale  b) IBW/kg (Calculated) : 77.6  Vital Signs: Temp: 98.3 F (36.8 C) (11/22 0524) Temp Source: Oral (11/22 0524) BP: 127/62 (11/22 0524) Pulse Rate: 65 (11/22 0808)  Labs: Recent Labs    08/21/17 1344 08/21/17 1947 08/22/17 0735 08/22/17 0738 08/23/17 0353  HGB  --   --  11.7*  --  11.8*  HCT  --   --  35.8*  --  36.6*  PLT  --   --  127*  --  120*  LABPROT  --   --  13.4  --  13.6  INR  --   --  1.03  --  1.05  HEPARINUNFRC  --  0.25*  --   --   --   CREATININE  --   --  1.89*  --  1.63*  TROPONINI 1.23* 12.27*  --  8.67*  --     Estimated Creatinine Clearance: 46.9 mL/min (A) (by C-G formula based on SCr of 1.63 mg/dL (H)).  Assessment: 71yom on coumadin pta for afib which had been on hold pending hernia repair surgery. S/P surgery 11/19, then had an NSTEMI and was started on heparin. S/P cath 11/21 found to have a PLV branch occlusion but no intervention made. Coumadin resumed post-cath without heparin bridge per Dr. Aundra Dubin. INR 1.05. Hgb 11.8, platelets 120 - low but stable.   PTA dose: 5mg  daily except 7.5mg  MWF  Goal of Therapy:  INR 2-3 Monitor platelets by anticoagulation protocol: Yes   Plan:  1) Coumadin 7.5mg  today prior to discharge then can resume usual home dose   Deboraha Sprang 08/23/2017,9:18 AM

## 2017-08-23 NOTE — Progress Notes (Signed)
1 Day Post-Op  Subjective: He feels fine and is hoping to go home today No problems with inguinal hernia incision. Has had a bowel movement.  Voiding without difficulty  Cath yesterday by Dr. Aundra Dubin..  Occlusion of PLV branch.  No interventional option.  Objective: Vital signs in last 24 hours: Temp:  [97.7 F (36.5 C)-98.3 F (36.8 C)] 98.3 F (36.8 C) (11/22 0524) Pulse Rate:  [52-74] 65 (11/22 0808) Resp:  [0-30] 18 (11/22 0524) BP: (94-139)/(32-79) 127/62 (11/22 0524) SpO2:  [94 %-100 %] 99 % (11/22 0524) Weight:  [81.4 kg (179 lb 8 oz)-81.7 kg (180 lb 1.6 oz)] 81.7 kg (180 lb 1.6 oz) (11/22 0524) Last BM Date: 08/20/17  Intake/Output from previous day: 11/21 0701 - 11/22 0700 In: 240 [P.O.:240] Out: 800 [Urine:800] Intake/Output this shift: No intake/output data recorded.   EXAM: General: WN Older WM who is alert and oriented.                         Extremities: Bandage over left radial artery clean and dry.  No hematoma. .                       Lungs: Clear                         Abdomen: Soft.  Has BS.                         Wound: Clean.  No hematoma.  Minor ecchymoses.  No swelling.  Repair intact.      Lab Results:  Results for orders placed or performed during the hospital encounter of 08/20/17 (from the past 24 hour(s))  Glucose, capillary     Status: None   Collection Time: 08/22/17 12:05 PM  Result Value Ref Range   Glucose-Capillary 95 65 - 99 mg/dL  Glucose, capillary     Status: Abnormal   Collection Time: 08/22/17  4:14 PM  Result Value Ref Range   Glucose-Capillary 136 (H) 65 - 99 mg/dL   Comment 1 Notify RN   Glucose, capillary     Status: Abnormal   Collection Time: 08/22/17  9:08 PM  Result Value Ref Range   Glucose-Capillary 151 (H) 65 - 99 mg/dL  CBC     Status: Abnormal   Collection Time: 08/23/17  3:53 AM  Result Value Ref Range   WBC 8.1 4.0 - 10.5 K/uL   RBC 3.84 (L) 4.22 - 5.81 MIL/uL   Hemoglobin 11.8 (L) 13.0 - 17.0 g/dL    HCT 36.6 (L) 39.0 - 52.0 %   MCV 95.3 78.0 - 100.0 fL   MCH 30.7 26.0 - 34.0 pg   MCHC 32.2 30.0 - 36.0 g/dL   RDW 14.1 11.5 - 15.5 %   Platelets 120 (L) 150 - 400 K/uL  Protime-INR     Status: None   Collection Time: 08/23/17  3:53 AM  Result Value Ref Range   Prothrombin Time 13.6 11.4 - 15.2 seconds   INR 3.81   Basic metabolic panel     Status: Abnormal   Collection Time: 08/23/17  3:53 AM  Result Value Ref Range   Sodium 138 135 - 145 mmol/L   Potassium 4.9 3.5 - 5.1 mmol/L   Chloride 106 101 - 111 mmol/L   CO2 27 22 - 32 mmol/L   Glucose, Bld 120 (H)  65 - 99 mg/dL   BUN 28 (H) 6 - 20 mg/dL   Creatinine, Ser 1.63 (H) 0.61 - 1.24 mg/dL   Calcium 8.3 (L) 8.9 - 10.3 mg/dL   GFR calc non Af Amer 41 (L) >60 mL/min   GFR calc Af Amer 48 (L) >60 mL/min   Anion gap 5 5 - 15  Glucose, capillary     Status: None   Collection Time: 08/23/17  7:55 AM  Result Value Ref Range   Glucose-Capillary 92 65 - 99 mg/dL     Studies/Results: No results found.  [name] aspirin  81 mg Oral Daily  . carvedilol  9.375 mg Oral BID WC  . digoxin  0.0625 mg Oral Daily  . levothyroxine  75 mcg Oral QAC breakfast  . linagliptin  5 mg Oral Daily  . rosuvastatin  10 mg Oral q1800  . sodium chloride flush  3 mL Intravenous Q12H  . Warfarin - Pharmacist Dosing Inpatient   Does not apply q1800     Assessment/Plan: s/p Procedure(s): LEFT HEART CATH AND CORS/GRAFTS ANGIOGRAPHY  POD #2.  Open repair LIH with mesh.  No postop problems.  Meets all discharge criteria surgically.                  Discharge instructions and discharge prescriptions previously given the patient and wife 2 days ago                  Has appointment to see Dr. Lucia Gaskins in follow-up on December 6                  Diet and activities discussed  Postop NSTEMI-cardiac cath yesterday.  Stable. Significant cardiac disease.  Followed by Dr. Algernon Huxley and Dr. court-ordered.  CABG 2007.  Redo CABG and MVR with bioprosthetic valve  07/11/2016 NST EMI 12/17/2016 Catheter-based ablation for atrial flutter 05/11/2017 EF 20-25% Left chest pacemaker and defibrillator   Plan: -He may be discharged when okay with cardiology.  Hopefully today. -See Dr. Lucia Gaskins in office on December 6    @PROBHOSP @  LOS: 2 days    Adin Hector 08/23/2017  . .prob

## 2017-08-23 NOTE — Progress Notes (Signed)
Patient ID: Christian Bowman, male   DOB: 09/09/1946, 71 y.o.   MRN: [id number]     Advanced Heart Failure Rounding Note   Primary Cardiologist: Croitoru/Crislyn Willbanks  Subjective:    Post-op NSTEMI with TnI to 12.  This am, no complaints.  Surgical site stable, cleared for discharge by surgery.  No dyspnea or chest pain.  Has walked around.   Coronary Findings (11/21)  Diagnostic  Dominance: Right  Left Main  90% distal left main stenosis.  Left Anterior Descending  LAD was occluded proximally. Patient LIMA-LAD with occluded distal LAD after LIMA touchdown, this is similar to the prior cath. There is an occluded SVG to diagonal (known from prior cath).  Left Circumflex  Diffuse mild disease in the AV LCx. No large OMs noted, several small diffusely diseased OMs present. No patent vein graft to LCx territory.  Right Coronary Artery  Native RCA is known to be occluded, not injected. Occluded SVG-RCA system from CABG #1. SVG-PDA is patent, touches down on diffusely diseased PDA. On the last cath, this back-filled to a severely diseased PLV branch. On today's cath, this branch has been lost.     Objective:   Weight Range: 180 lb 1.6 oz (81.7 kg) Body mass index is 24.43 kg/m.   Vital Signs:   Temp:  [97.7 F (36.5 C)-98.3 F (36.8 C)] 98.3 F (36.8 C) (11/22 0524) Pulse Rate:  [52-74] 65 (11/22 0808) Resp:  [0-30] 18 (11/22 0524) BP: (94-139)/(32-79) 127/62 (11/22 0524) SpO2:  [94 %-100 %] 99 % (11/22 0524) Weight:  [179 lb 8 oz (81.4 kg)-180 lb 1.6 oz (81.7 kg)] 180 lb 1.6 oz (81.7 kg) (11/22 0524) Last BM Date: 08/20/17  Weight change: Filed Weights   08/22/17 1013 08/22/17 1528 08/23/17 0524  Weight: 179 lb 8 oz (81.4 kg) 179 lb 10.8 oz (81.5 kg) 180 lb 1.6 oz (81.7 kg)    Intake/Output:   Intake/Output Summary (Last 24 hours) at 08/23/2017 0902 Last data filed at 08/23/2017 0600 Gross per 24 hour  Intake 240 ml  Output 600 ml  Net -360 ml      Physical Exam      General: NAD Neck: No JVD, no thyromegaly or thyroid nodule.  Lungs: Clear to auscultation bilaterally with normal respiratory effort. CV: Nondisplaced PMI.  Heart regular S1/S2, no S3/S4, 1/6 SEM RUSB.  No peripheral edema.   Abdomen: Soft, nontender, no hepatosplenomegaly, no distention.  Skin: Intact without lesions or rashes.  Neurologic: Alert and oriented x 3.  Psych: Normal affect. Extremities: No clubbing or cyanosis.  HEENT: Normal.    Telemetry   A- BiV pacing (personally reviewed)  Labs    CBC Recent Labs    08/22/17 0735 08/23/17 0353  WBC 11.4* 8.1  NEUTROABS 8.8*  --   HGB 11.7* 11.8*  HCT 35.8* 36.6*  MCV 95.7 95.3  PLT 127* 381*   Basic Metabolic Panel Recent Labs    08/22/17 0735 08/23/17 0353  NA 138 138  K 4.7 4.9  CL 105 106  CO2 28 27  GLUCOSE 126* 120*  BUN 36* 28*  CREATININE 1.89* 1.63*  CALCIUM 8.6* 8.3*   Liver Function Tests No results for input(s): AST, ALT, ALKPHOS, BILITOT, PROT, ALBUMIN in the last 72 hours. No results for input(s): LIPASE, AMYLASE in the last 72 hours. Cardiac Enzymes Recent Labs    08/21/17 1344 08/21/17 1947 08/22/17 0738  TROPONINI 1.23* 12.27* 8.67*    BNP: BNP (last 3 results) Recent Labs  09/18/16 1232 10/16/16 1239 12/15/16 0953  BNP 3,909.3* 3,654.9* >4,500.0*    ProBNP (last 3 results) No results for input(s): PROBNP in the last 8760 hours.   D-Dimer No results for input(s): DDIMER in the last 72 hours. Hemoglobin A1C No results for input(s): HGBA1C in the last 72 hours. Fasting Lipid Panel No results for input(s): CHOL, HDL, LDLCALC, TRIG, CHOLHDL, LDLDIRECT in the last 72 hours. Thyroid Function Tests No results for input(s): TSH, T4TOTAL, T3FREE, THYROIDAB in the last 72 hours.  Invalid input(s): FREET3  Other results:   Imaging    No results found.   Medications:     Scheduled Medications: . aspirin  81 mg Oral Daily  . carvedilol  9.375 mg Oral BID WC  .  digoxin  0.0625 mg Oral Daily  . levothyroxine  75 mcg Oral QAC breakfast  . linagliptin  5 mg Oral Daily  . rosuvastatin  10 mg Oral q1800  . sacubitril-valsartan  1 tablet Oral BID  . sodium chloride flush  3 mL Intravenous Q12H  . spironolactone  12.5 mg Oral Daily  . Warfarin - Pharmacist Dosing Inpatient   Does not apply q1800    Infusions: . sodium chloride      PRN Medications: sodium chloride, acetaminophen, diazepam, HYDROcodone-acetaminophen, morphine injection, nitroGLYCERIN, ondansetron **OR** ondansetron (ZOFRAN) IV, sodium chloride flush, traMADol    Patient Profile   Christian Bowman is a 71 y.o. male with history of CAD s/p CABG in 2007 then redo CABG in 10/17 with mitral valve replacement presents for cardiology followup. He was admitted in 10/17 for redo CABG with SVG-PDA and SVG-ramus. He also had mitral valve replacement with a bioprosthetic valve because of infarct-related mitral regurgitation.   CHF team asked to see after post-op chest pain on 11/20. Inguinal repair underwent 96/78/93 without complication.   Assessment/Plan   1. NSTEMI: Known CAD s/p redo CABG.  He had chest pain 11/20 while at rest, resolved with 2 NTG.  Troponin after this rose up to 12.3.  He has had no further chest pain.  Last cath in 3/18 with NSTEMI and chest pain though troponin not as high => no intervention.  He has a stable pattern of chest tightness typically that occurs with stress and not with exertion. He takes 6-8 NTGs/month.  LHC on 11/21, suspected culprit lesion for NSTEMI is occlusion of a PLV branch.  This was back-filled from the SVG-PDA (based on prior cath images) and has been lost.  The PLV had severe, diffuse disease on the last cath.  There was no interventional option.   - Continue statin and ASA.  - He has restarted warfarin (on at home for atrial arrhythmias).  2. Chronic systolic CHF: Ischemic cardiomyopathy. TEE 10/17 with EF 20-25%. Has Medtronic CRT-D system.   Echo this admission with EF 20-25%.  He is not volume overloaded on exam.  - Entresto, spironolactone, Lasix were held pre-cath given CKD stage 3.  These can be restarted going home.   -I have lowered dose of Coreg to 9.375 mg bid.  - Continue digoxin, level ok.  3. Bioprosthetic mitral valve:  Stable on echo this admission.   4. Atrial flutter and history of post-op atrial fibrillation:s/p flutter ablation in 8/18. Remains in NSR off amio.  - Continue warfarin (restarted post-op).   5. CKD: Stage III. Creatinine down to 1.6 this morning.   6. Inguinal hernia: s/p repair uncomplicated repair 81/01/75.  7. Disposition: Cleared by surgery for discharge.  I  think he can go home from cardiac standpoint.  He will restart cardiac rehab when ok'd by surgery.  Will need followup with coumadin clinic (restarted coumadin last night).  He will need cardiology followup (should have in a couple of weeks).  Meds for discharge: Warfarin per coumadin clinic, ASA 81 daily, Coreg 9.375 mg bid, Lasix 20 mg every other day, Entresto 24/26 bid, spironolactone 12.5 mg qhs, digoxin 0.0625 mg daily, Crestor 10 daily.   Length of Stay: 2  Loralie Champagne, MD  08/23/2017, 9:02 AM  Advanced Heart Failure Team Pager [phone] (M-F; 7a - 4p)  Please contact [facility] for night-coverage after hours (4p -7a ) and weekends on [email or web address]

## 2017-08-23 NOTE — Progress Notes (Signed)
Paged MD regarding discharge not reconciled

## 2017-08-24 ENCOUNTER — Encounter (HOSPITAL_COMMUNITY): Payer: Self-pay | Admitting: Cardiology

## 2017-08-29 ENCOUNTER — Ambulatory Visit (INDEPENDENT_AMBULATORY_CARE_PROVIDER_SITE_OTHER): Payer: Medicare Other | Admitting: *Deleted

## 2017-08-29 DIAGNOSIS — Z953 Presence of xenogenic heart valve: Secondary | ICD-10-CM

## 2017-08-29 DIAGNOSIS — Z5181 Encounter for therapeutic drug level monitoring: Secondary | ICD-10-CM

## 2017-08-29 DIAGNOSIS — I483 Typical atrial flutter: Secondary | ICD-10-CM | POA: Diagnosis not present

## 2017-08-29 LAB — POCT INR: INR: 1.3

## 2017-09-03 ENCOUNTER — Ambulatory Visit (INDEPENDENT_AMBULATORY_CARE_PROVIDER_SITE_OTHER): Payer: Medicare Other | Admitting: *Deleted

## 2017-09-03 DIAGNOSIS — I483 Typical atrial flutter: Secondary | ICD-10-CM | POA: Diagnosis not present

## 2017-09-03 DIAGNOSIS — Z953 Presence of xenogenic heart valve: Secondary | ICD-10-CM | POA: Diagnosis not present

## 2017-09-03 DIAGNOSIS — Z5181 Encounter for therapeutic drug level monitoring: Secondary | ICD-10-CM | POA: Diagnosis not present

## 2017-09-03 LAB — POCT INR: INR: 2.6

## 2017-09-04 ENCOUNTER — Encounter (HOSPITAL_COMMUNITY): Payer: Self-pay

## 2017-09-04 ENCOUNTER — Ambulatory Visit (HOSPITAL_COMMUNITY)
Admission: RE | Admit: 2017-09-04 | Discharge: 2017-09-04 | Disposition: A | Discharge status: Home / Self Care | Payer: Medicare Other | Source: Ambulatory Visit | Attending: Cardiology | Admitting: Cardiology

## 2017-09-04 ENCOUNTER — Other Ambulatory Visit: Payer: Self-pay | Admitting: Internal Medicine

## 2017-09-04 VITALS — BP 140/81 | HR 83 | Wt 175.0 lb

## 2017-09-04 DIAGNOSIS — I5022 Chronic systolic (congestive) heart failure: Secondary | ICD-10-CM | POA: Diagnosis not present

## 2017-09-04 DIAGNOSIS — E785 Hyperlipidemia, unspecified: Secondary | ICD-10-CM | POA: Diagnosis not present

## 2017-09-04 DIAGNOSIS — E039 Hypothyroidism, unspecified: Secondary | ICD-10-CM | POA: Insufficient documentation

## 2017-09-04 DIAGNOSIS — Z8249 Family history of ischemic heart disease and other diseases of the circulatory system: Secondary | ICD-10-CM | POA: Insufficient documentation

## 2017-09-04 DIAGNOSIS — I25810 Atherosclerosis of coronary artery bypass graft(s) without angina pectoris: Secondary | ICD-10-CM | POA: Diagnosis not present

## 2017-09-04 DIAGNOSIS — Z7982 Long term (current) use of aspirin: Secondary | ICD-10-CM | POA: Insufficient documentation

## 2017-09-04 DIAGNOSIS — I4892 Unspecified atrial flutter: Secondary | ICD-10-CM

## 2017-09-04 DIAGNOSIS — I5042 Chronic combined systolic (congestive) and diastolic (congestive) heart failure: Secondary | ICD-10-CM | POA: Diagnosis not present

## 2017-09-04 DIAGNOSIS — Z79899 Other long term (current) drug therapy: Secondary | ICD-10-CM | POA: Diagnosis not present

## 2017-09-04 DIAGNOSIS — I340 Nonrheumatic mitral (valve) insufficiency: Secondary | ICD-10-CM | POA: Insufficient documentation

## 2017-09-04 DIAGNOSIS — Z9889 Other specified postprocedural states: Secondary | ICD-10-CM | POA: Insufficient documentation

## 2017-09-04 DIAGNOSIS — Z952 Presence of prosthetic heart valve: Secondary | ICD-10-CM | POA: Diagnosis not present

## 2017-09-04 DIAGNOSIS — Z7902 Long term (current) use of antithrombotics/antiplatelets: Secondary | ICD-10-CM | POA: Insufficient documentation

## 2017-09-04 DIAGNOSIS — I255 Ischemic cardiomyopathy: Secondary | ICD-10-CM | POA: Diagnosis not present

## 2017-09-04 DIAGNOSIS — E1122 Type 2 diabetes mellitus with diabetic chronic kidney disease: Secondary | ICD-10-CM | POA: Insufficient documentation

## 2017-09-04 DIAGNOSIS — I252 Old myocardial infarction: Secondary | ICD-10-CM | POA: Diagnosis not present

## 2017-09-04 DIAGNOSIS — I25708 Atherosclerosis of coronary artery bypass graft(s), unspecified, with other forms of angina pectoris: Secondary | ICD-10-CM | POA: Diagnosis not present

## 2017-09-04 DIAGNOSIS — I442 Atrioventricular block, complete: Secondary | ICD-10-CM | POA: Diagnosis not present

## 2017-09-04 DIAGNOSIS — N183 Chronic kidney disease, stage 3 (moderate): Secondary | ICD-10-CM | POA: Diagnosis not present

## 2017-09-04 DIAGNOSIS — I2582 Chronic total occlusion of coronary artery: Secondary | ICD-10-CM | POA: Diagnosis not present

## 2017-09-04 MED ORDER — ISOSORBIDE MONONITRATE ER 30 MG PO TB24: 30.0000 mg | ORAL_TABLET | Freq: Every day | ORAL | 11 refills | Status: DC

## 2017-09-04 MED ORDER — SACUBITRIL-VALSARTAN 49-51 MG PO TABS: 1.0000 | ORAL_TABLET | Freq: Two times a day (BID) | ORAL | 6 refills | Status: DC

## 2017-09-04 NOTE — Patient Instructions (Signed)
START Imdur 30 mg tablet once daily.  INCREASE Entresto to 49/51 mg twice daily. Can "double up" on your current 24/26 mg tablets you have at home (Take 2 tabs twice daily). New Rx has been sent to your pharmacy for 49/51 mg tablets (Take 1 tab twice daily).  Follow up as scheduled with Dr. Aundra Dubin next month.  Take all medication as prescribed the day of your appointment. Bring all medications with you to your appointment.  Do the following things EVERYDAY: 1) Weigh yourself in the morning before breakfast. Write it down and keep it in a log. 2) Take your medicines as prescribed 3) Eat low salt foods-Limit salt (sodium) to 2000 mg per day.  4) Stay as active as you can everyday 5) Limit all fluids for the day to less than 2 liters

## 2017-09-04 NOTE — Progress Notes (Signed)
PCP: Dr Hilma Favors Cardiology: Dr. Sallyanne Kuster HF Cardiology: Dr. Aundra Dubin  71 yo with history of CAD s/p CABG in 2007 then redo CABG in 10/17 with mitral valve replacement presents for cardiology followup.  He was admitted in 10/17 for redo CABG with SVG-PDA and SVG-ramus.  He also had mitral valve replacement with a bioprosthetic valve because of infarct-related mitral regurgitation.  His EF on last study (10/17 TEE) was 20-25%.   Post-operative course was complicated by CHF requiring diuresis.  He also had atrial flutter and required DCCV.  Due to complete heart block, he later got a CRT-D system.    At a prior visit, he was in atrial flutter.  He saw Dr. Curt Bears, it was decided to arrange for DCCV with ablation down the road when PPM leads have been in longer.  He had successful DCCV in 12/17 and is in NSR today.   He was admitted in 3/18 with NSTEMI and chest pain. TnI only 0.5.  LHC showed occlusion of SVG-PDA from CABG#1 to be the likely culprit.  However, SVG-PDA from CABG#2 was patent.  No intervention.  Echo in 3/18 from [place] showed EF 30-35%, stable bioprosthetic mitral valve.   He had atrial flutter ablation in 8/18.    He was admitted 11/19 for hernia repair. He had chest pain so cardiology consulted. He underwent LHC that showed occluded PLV which was previously  back filled from SVG-PDA but now lost. He continued with medical management. Due to hypotension coreg and entresto reduced. Discharge weight 180 pounds.   Today he returns for post hospital follow up. Overall feeling fine. Frustrated about the number of doctor appointments.  Denies SOB/PND/Orthopnea. Appetite ok. No fever or chills. Not weighing at home. Having CP a few times a week that is relieved with SL NTG. Says it happens when he is stressed. Taking all medications .   Optivol: Fluid index below threshold. >99% BiV pacing, non VT/AF. Acitivy < 1 hour per day.   Labs (11/17): K 4.8, creatinine 1.89, hgb 12.3, digoxin 0.9,  LFTs normal, TSH 8.235 (mild increase), free T3 low, free T4 normal, LFTs normal.  Labs (12/17): K 4.5, creatinine 1.7, BNP 3909 Labs (3/18): K 3.9, creatinine 1.48, hgb 11.3 Labs (4/18): digoxin 0.5, LFTs normal Labs (7/18): K 5.1, creatinine 1.78, LDL 63, HDL 29, TSH elevated Labs (11/18): K 4.5, creatinine 1.86, hgb 13.6 Labs (08/23/2017) : K 4.9 Creatinine 1.63.   PMH: 1. CAD: CABG 2007.  - LHC (9/17) with patent LIMA-LAD, totally occluded SVG-D, severe stenosis in SVG-PDA.  - Redo CABG 10/17 with SVG-PDA, SVG-ramus and mitral valve replacement.  - NSTEMI 3/18.  LHC with culprit lesion likely occlusion of SVG-PDA from CABG#1.  Patent SVG-PDA from CABG#2.  No intervention.  2. Mitral regurgitation: Ischemic MR, mitral valve replacement was done in 10/17 (bioprosthetic). 3. Complete heart block: Post-op in 10/17.  Medtronic CRT-D placed.  4. Atrial flutter: DCCV 10/17 and again in 12/17.  - Ablation 8/18, now off amiodarone.  5. Type II diabetes 6. Hyperlipidemia 7. Chronic systolic CHF: Ischemic cardiomyopathy.   - TEE (10/17): EF 20-25%, severe LV dilation - Echo (3/18, [place]): EF 30-35%, bioprosthetic mitral valve with mean gradient 5 mmHg.  8. Hypothyroidism  SH: Married, retired, lives in [place], nonsmoker, no ETOH.   Family History  Problem Relation Age of Onset  . Heart attack Mother   . Heart disease Mother   . Heart attack Father   . Heart disease Father   . Hypertension  Father   . Hypertension Sister   . Heart disease Maternal Grandmother    ROS: All systems reviewed and negative except as per HPI.   Current Outpatient Medications  Medication Sig Dispense Refill  . ACCU-CHEK SOFTCLIX LANCETS lancets Use as instructed. E11.65 (Patient not taking: Reported on 08/15/2017) 100 each 2  . amoxicillin (AMOXIL) 500 MG tablet Take 4 tablets (2,000 mg total) by mouth as needed. Pre-dental work only. 4 tablet 2  . aspirin EC 81 MG tablet Take 81 mg by mouth daily.      . carvedilol (COREG) 6.25 MG tablet Takes 1.5 tablets (9.375 mg) in AM and PM. 105 tablet 11  . digoxin (LANOXIN) 0.125 MG tablet Take 0.5 tablets (0.0625 mg total) by mouth daily. 15 tablet 11  . furosemide (LASIX) 20 MG tablet Take 1 tablet (20 mg total) by mouth every other day. 30 tablet 11  . levothyroxine (SYNTHROID, LEVOTHROID) 75 MCG tablet Take 1 tablet (75 mcg total) by mouth daily before breakfast. 30 tablet 3  . linagliptin (TRADJENTA) 5 MG TABS tablet Take 1 tablet (5 mg total) by mouth daily. 30 tablet 3  . neomycin-bacitracin-polymyxin (NEOSPORIN) ointment Apply 1 application topically daily as needed for wound care.    . nitroGLYCERIN (NITROSTAT) 0.4 MG SL tablet Place 1 tablet (0.4 mg total) under the tongue every 5 (five) minutes x 3 doses as needed for chest pain. 30 tablet 12  . polyethylene glycol (MIRALAX / GLYCOLAX) packet Take 17 g by mouth daily as needed for mild constipation or moderate constipation.    . rosuvastatin (CRESTOR) 10 MG tablet Take 10 mgs by mouth once daily in the evening 90 tablet 3  . sacubitril-valsartan (ENTRESTO) 24-26 MG Take 1 tablet by mouth 2 (two) times daily. 60 tablet 11  . spironolactone (ALDACTONE) 25 MG tablet Take 0.5 tablets (12.5 mg total) by mouth daily. 30 tablet 6  . warfarin (COUMADIN) 5 MG tablet Take 5mg  by mouth once daily in the evening except Tuesdays and Saturdays take 7.5mg  once daily in the evening (Patient taking differently: Take 5-7.5 mg See admin instructions by mouth. Take 7.5 mg by mouth daily on Monday, Wednesday and Friday. Take 5 mg by mouth daily on all other days) 45 tablet 3   No current facility-administered medications for this encounter.    BP 140/81 (BP Location: Left Arm, Patient Position: Sitting, Cuff Size: Normal)   Pulse 83   Wt 175 lb (79.4 kg)   SpO2 100%   BMI 23.73 kg/m  General:  Well appearing. No resp difficulty. Wife present.  HEENT: normal except large r neck lipoma Neck: supple. no JVD.  Carotids 2+ bilat; no bruits. No lymphadenopathy or thryomegaly appreciated. Cor: PMI nondisplaced. Regular rate & rhythm. No rubs, gallops or murmurs. Lungs: clear Abdomen: soft, nontender, nondistended. No hepatosplenomegaly. No bruits or masses. Good bowel sounds. Extremities: no cyanosis, clubbing, rash, edema Neuro: alert & orientedx3, cranial nerves grossly intact. moves all 4 extremities w/o difficulty. Affect pleasant  EKG:  A sensed V paced 71 bpm    Assessment/Plan: 1. Chronic systolic CHF: Ischemic cardiomyopathy.  TEE 10/17 with EF 20-25%.  Has Medtronic CRT-D system. Most recent echo from [facility] in 3/18 with EF 30-35%.   NYHA II.  Stop lasix with entresto entresto increase. e.  Increase entresto 49-51 mg twice a day.  Continue current dose of digoxin, spiro, and carvedilol  - Repeat echo 3/19.  2. CAD: s/p redo CABG.  Admit 08/2017 NSTEMI occlusion  of a PLV branch.  This was back-filled from the SVG-PDA (based on prior cath images) and has been lost.  The PLV had severe, diffuse disease on the last cath.   - Continues to take sl ntg a few times a week.  - Add 30 mg imdur daily. .   - Continue statin => Crestor 10 mg daily. Good lipids in 7/18.  - Restart cardiac rehab when ok with Dr Lucia Gaskins. .   3. Bioprosthetic mitral valve: Stable on 3/18 echo from [place]. 4. Atrial flutter: s/p ablation in 8/18.  Maintaining NSR. Off amiodarone.  - Continue warfarin.  5. CKD: Stage III.  I reviewed BMET from  11/22/ Stable.  6. Inguinal hernia: S/P Repair 08/2017. Has follow up with General Surgery.   Follow up in 4 weeks with Dr Aundra Dubin. Plan to repeat BMET at that time.   Greater than 50% of the (total minutes 25) visit spent in counseling/coordination of care regarding current medications, optivol results, EKG, and medication titration.    Toshi Ishii NP-C  09/04/2017

## 2017-09-12 ENCOUNTER — Encounter: Payer: Self-pay | Admitting: Cardiovascular Disease

## 2017-09-12 ENCOUNTER — Telehealth: Payer: Self-pay | Admitting: Cardiovascular Disease

## 2017-09-12 ENCOUNTER — Encounter: Payer: Medicare Other | Admitting: Cardiovascular Disease

## 2017-09-12 NOTE — Telephone Encounter (Signed)
Called patient and LVM to call back to rescheduled today's appointment with Dr. Sallyanne Kuster.

## 2017-09-15 ENCOUNTER — Encounter (HOSPITAL_COMMUNITY): Payer: Self-pay | Admitting: Cardiology

## 2017-09-17 ENCOUNTER — Ambulatory Visit (INDEPENDENT_AMBULATORY_CARE_PROVIDER_SITE_OTHER): Payer: Medicare Other | Admitting: *Deleted

## 2017-09-17 ENCOUNTER — Other Ambulatory Visit: Payer: Self-pay | Admitting: Cardiovascular Disease

## 2017-09-17 DIAGNOSIS — Z5181 Encounter for therapeutic drug level monitoring: Secondary | ICD-10-CM

## 2017-09-17 DIAGNOSIS — I483 Typical atrial flutter: Secondary | ICD-10-CM

## 2017-09-17 DIAGNOSIS — Z953 Presence of xenogenic heart valve: Secondary | ICD-10-CM | POA: Diagnosis not present

## 2017-09-17 LAB — POCT INR: INR: 2.3

## 2017-09-17 NOTE — Telephone Encounter (Signed)
REFILL 

## 2017-09-27 ENCOUNTER — Other Ambulatory Visit: Payer: Self-pay | Admitting: Internal Medicine

## 2017-10-01 ENCOUNTER — Other Ambulatory Visit: Payer: Self-pay | Admitting: "Endocrinology

## 2017-10-01 DIAGNOSIS — E039 Hypothyroidism, unspecified: Secondary | ICD-10-CM | POA: Diagnosis not present

## 2017-10-01 DIAGNOSIS — E1159 Type 2 diabetes mellitus with other circulatory complications: Secondary | ICD-10-CM | POA: Diagnosis not present

## 2017-10-01 DIAGNOSIS — E1165 Type 2 diabetes mellitus with hyperglycemia: Secondary | ICD-10-CM | POA: Diagnosis not present

## 2017-10-01 LAB — HEMOGLOBIN A1C: HEMOGLOBIN A1C: 6.3

## 2017-10-01 LAB — BASIC METABOLIC PANEL
BUN: 39 — AB (ref 4–21)
Creatinine: 1.9 — AB (ref <=1.3)

## 2017-10-01 LAB — TSH: TSH: 4.88 (ref <=5.90)

## 2017-10-02 LAB — COMPREHENSIVE METABOLIC PANEL
ALT: 18 IU/L (ref 0–44)
AST: 15 IU/L (ref 0–40)
Albumin/Globulin Ratio: 1.6 (ref 1.2–2.2)
Albumin: 4.2 g/dL (ref 3.6–4.8)
Alkaline Phosphatase: 64 IU/L (ref 39–117)
BUN/Creatinine Ratio: 21 (ref 10–24)
BUN: 39 mg/dL — AB (ref 8–27)
Bilirubin Total: 0.4 mg/dL (ref 0.0–1.2)
CALCIUM: 9.2 mg/dL (ref 8.6–10.2)
CO2: 25 mmol/L (ref 20–29)
CREATININE: 1.88 mg/dL — AB (ref 0.76–1.27)
Chloride: 99 mmol/L (ref 96–106)
GFR calc Af Amer: 41 mL/min/{1.73_m2} — ABNORMAL LOW (ref >59)
GFR, EST NON AFRICAN AMERICAN: 36 mL/min/{1.73_m2} — AB (ref >59)
GLOBULIN, TOTAL: 2.7 g/dL (ref 1.5–4.5)
GLUCOSE: 111 mg/dL — AB (ref 65–99)
Potassium: 5 mmol/L (ref 3.5–5.2)
SODIUM: 140 mmol/L (ref 134–144)
Total Protein: 6.9 g/dL (ref 6.0–8.5)

## 2017-10-02 LAB — TSH: TSH: 4.88 u[IU]/mL — AB (ref 0.450–4.500)

## 2017-10-02 LAB — HGB A1C W/O EAG: HEMOGLOBIN A1C: 6.3 % — AB (ref 4.8–5.6)

## 2017-10-02 LAB — T4, FREE: FREE T4: 1.4 ng/dL (ref 0.82–1.77)

## 2017-10-03 DIAGNOSIS — I252 Old myocardial infarction: Secondary | ICD-10-CM | POA: Diagnosis not present

## 2017-10-04 ENCOUNTER — Encounter: Payer: Self-pay | Admitting: "Endocrinology

## 2017-10-04 ENCOUNTER — Ambulatory Visit (INDEPENDENT_AMBULATORY_CARE_PROVIDER_SITE_OTHER): Payer: Medicare Other | Admitting: "Endocrinology

## 2017-10-04 VITALS — BP 134/79 | HR 70 | Ht 72.0 in | Wt 180.0 lb

## 2017-10-04 DIAGNOSIS — E782 Mixed hyperlipidemia: Secondary | ICD-10-CM

## 2017-10-04 DIAGNOSIS — I10 Essential (primary) hypertension: Secondary | ICD-10-CM | POA: Diagnosis not present

## 2017-10-04 DIAGNOSIS — E1159 Type 2 diabetes mellitus with other circulatory complications: Secondary | ICD-10-CM

## 2017-10-04 DIAGNOSIS — N183 Chronic kidney disease, stage 3 (moderate): Secondary | ICD-10-CM | POA: Diagnosis not present

## 2017-10-04 DIAGNOSIS — E1122 Type 2 diabetes mellitus with diabetic chronic kidney disease: Secondary | ICD-10-CM

## 2017-10-04 DIAGNOSIS — N1831 Chronic kidney disease, stage 3a: Secondary | ICD-10-CM | POA: Insufficient documentation

## 2017-10-04 DIAGNOSIS — E039 Hypothyroidism, unspecified: Secondary | ICD-10-CM

## 2017-10-04 MED ORDER — LEVOTHYROXINE SODIUM 88 MCG PO TABS: 88.0000 ug | ORAL_TABLET | Freq: Every day | ORAL | 2 refills | Status: DC

## 2017-10-04 MED ORDER — LINAGLIPTIN 5 MG PO TABS: 5.0000 mg | ORAL_TABLET | Freq: Every day | ORAL | 2 refills | Status: DC

## 2017-10-04 NOTE — Progress Notes (Signed)
Subjective:    Patient ID: Christian Bowman, male    DOB: 07-28-1946,    Past Medical History:  Diagnosis Date  . AICD (automatic cardioverter/defibrillator) present    medtronic-   DR. CROITORU , DR. Aundra Dubin   . Anginal pain ([place])    cp sat 08/11/17  . Anxiety   . CAD (coronary artery disease)   . CHF (congestive heart failure) ([place])   . Complication of anesthesia    took awhile to wake up   . Coronary artery disease involving coronary bypass graft   . Cyst of neck    right side  . DM2 (diabetes mellitus, type 2) ([place]) 08/26/2013  . Dyspnea   . Heart attack ([place])    "not sure when" (08/20/2017)  . HTN (hypertension) 08/26/2013  . Hyperlipidemia 08/26/2013  . Hypothyroidism   . Left main coronary artery disease   . Left renal artery stenosis ([place])    Genesis 6x12 stent 2007  . Obesity (BMI 30.0-34.9) 08/26/2013  . Postoperative atrial fibrillation ([place]) 10/15/2005  . Presence of permanent cardiac pacemaker   . S/P CABG x 4 10/13/2005   LIMA to LAD, SVG to intermediate branch, sequential SVG to PDA and RPL branch, EVH via right thigh  . S/P mitral valve replacement with bioprosthetic valve 07/11/2016   31 mm [facility] Mitral bovine bioprosthetic tissue valve  . S/P redo CABG x 2 07/11/2016   SVG to PDA and SVG to Intermediate Branch, EVH via left thigh   Past Surgical History:  Procedure Laterality Date  . A-FLUTTER ABLATION N/A 05/11/2017   Procedure: A-Flutter Ablation;  Surgeon: Constance Haw, MD;  Location: [place] CV LAB;  Service: Cardiovascular;  Laterality: N/A;  . CARDIAC CATHETERIZATION N/A 06/21/2016   Procedure: Right/Left Heart Cath and Coronary/Graft Angiography;  Surgeon: Sherren Mocha, MD;  Location: [place] CV LAB;  Service: Cardiovascular;  Laterality: N/A;  . CARDIAC VALVE REPLACEMENT    . CARDIOVERSION N/A 07/19/2016   Procedure: CARDIOVERSION;  Surgeon: Lelon Perla, MD;  Location: [facility] ENDOSCOPY;  Service: Cardiovascular;   Laterality: N/A;  . CARDIOVERSION N/A 09/08/2016   Procedure: CARDIOVERSION;  Surgeon: Fay Records, MD;  Location: [place];  Service: Cardiovascular;  Laterality: N/A;  . CORONARY ANGIOPLASTY     STENT 2016  [place]     DES in SVG to right coronary artery system  . CORONARY ARTERY BYPASS GRAFT  10/13/2005   LIMA to LAD, SVG to intermediate branch, sequential SVG to PDA and RPL  . CORONARY ARTERY BYPASS GRAFT N/A 07/11/2016   Procedure: REDO CORONARY ARTERY BYPASS GRAFTING (CABG) x two using left leg greater saphenous vein harvested endoscopically-SVG to PDA -SVG to RAMUS INTERMEDIATE;  Surgeon: Rexene Alberts, MD;  Location: [place];  Service: Open Heart Surgery;  Laterality: N/A;  . CORONARY ARTERY BYPASS GRAFT N/A 07/11/2016   Procedure: Re-exploration (CABG) for post op bleeding,;  Surgeon: Rexene Alberts, MD;  Location: [place];  Service: Open Heart Surgery;  Laterality: N/A;  . EP IMPLANTABLE DEVICE N/A 07/25/2016   Procedure: BiV ICD Insertion CRT-D;  Surgeon: Will Meredith Leeds, MD;  Location: [place] CV LAB;  Service: Cardiovascular;  Laterality: N/A;  . INGUINAL HERNIA REPAIR Left 08/20/2017  . INGUINAL HERNIA REPAIR Left 08/20/2017   Procedure: OPEN LEFT INGUINAL HERNIA REPAIR;  Surgeon: Alphonsa Overall, MD;  Location: [place];  Service: General;  Laterality: Left;  . LEFT  HEART CATH AND CORS/GRAFTS ANGIOGRAPHY N/A 12/18/2016   Procedure: Left Heart Cath and Cors/Grafts Angiography;  Surgeon: Sherren Mocha, MD;  Location: [place] CV LAB;  Service: Cardiovascular;  Laterality: N/A;  . LEFT HEART CATH AND CORS/GRAFTS ANGIOGRAPHY N/A 08/22/2017   Procedure: LEFT HEART CATH AND CORS/GRAFTS ANGIOGRAPHY;  Surgeon: Larey Dresser, MD;  Location: [place] CV LAB;  Service: Cardiovascular;  Laterality: N/A;  . MITRAL VALVE REPLACEMENT N/A 07/11/2016   Procedure: MITRAL VALVE (MV) REPLACEMENT;  Surgeon: Rexene Alberts, MD;   Location: [place];  Service: Open Heart Surgery;  Laterality: N/A;  . MYOCARDICAL PERFUSION  10/09/2007   NORMAL PERFUSION IN ALL REGIONS;NO EVIDENCE OF INDUCIBLE ISCHEMIA;POST STRESS EF% 66  . RENAL ARTERY STENT Right 2007  . RENAL DOPPLER  03/28/2010   RIGHT RA-NORMAL;LEFT PROXIMAL RA AT STENT-PATENT WITH NO EVIDENCE OF SIGN DIAMETER REDUCTION. R & L KIDNEYS: EQUAL IN SIZE,SYMMETRICAL IN SHAPE.  . TEE WITHOUT CARDIOVERSION N/A 06/15/2016   Procedure: TRANSESOPHAGEAL ECHOCARDIOGRAM (TEE);  Surgeon: Sanda Klein, MD;  Location: [facility] ENDOSCOPY;  Service: Cardiovascular;  Laterality: N/A;  . TEE WITHOUT CARDIOVERSION N/A 07/11/2016   Procedure: TRANSESOPHAGEAL ECHOCARDIOGRAM (TEE);  Surgeon: Rexene Alberts, MD;  Location: [place];  Service: Open Heart Surgery;  Laterality: N/A;  . TEE WITHOUT CARDIOVERSION N/A 07/19/2016   Procedure: TRANSESOPHAGEAL ECHOCARDIOGRAM (TEE);  Surgeon: Lelon Perla, MD;  Location: [facility] ENDOSCOPY;  Service: Cardiovascular;  Laterality: N/A;  . TRANSESOPHAGEAL ECHOCARDIOGRAM  10/19/2005   NORMAL LV; MILD TO MODERATE AMOUNT OF SOFT ATHEROMATOUS PLAQUE OF THE THORACIC AORTA; THE LEFT ATRIUM IS MILDLY DILATED;LEFT ATRIAL APPENDAGE FUNCTION IS NORMAL;NO THROMBUS IDENTIFIED. SMALL PFO WITH RIGHT TO LEFT SHUNT   Social History   Socioeconomic History  . Marital status: Married    Spouse name: None  . Number of children: None  . Years of education: None  . Highest education level: None  Social Needs  . Financial resource strain: None  . Food insecurity - worry: None  . Food insecurity - inability: None  . Transportation needs - medical: None  . Transportation needs - non-medical: None  Occupational History  . None  Tobacco Use  . Smoking status: Never Smoker  . Smokeless tobacco: Never Used  Substance and Sexual Activity  . Alcohol use: No  . Drug use: No  . Sexual activity: No  Other Topics Concern  . None  Social History Narrative  . None   Outpatient  Encounter Medications as of 10/04/2017  Medication Sig  . ACCU-CHEK SOFTCLIX LANCETS lancets Use as instructed. E11.65  . amoxicillin (AMOXIL) 500 MG tablet Take 4 tablets (2,000 mg total) by mouth as needed. Pre-dental work only.  [name] aspirin EC 81 MG tablet Take 81 mg by mouth daily.   . carvedilol (COREG) 6.25 MG tablet Takes 1.5 tablets (9.375 mg) in AM and PM.  . digoxin (LANOXIN) 0.125 MG tablet Take 0.5 tablets (0.0625 mg total) by mouth daily.  . furosemide (LASIX) 20 MG tablet Take 1 tablet (20 mg total) by mouth every other day.  . levothyroxine (SYNTHROID, LEVOTHROID) 88 MCG tablet Take 1 tablet (88 mcg total) by mouth daily before breakfast.  . linagliptin (TRADJENTA) 5 MG TABS tablet Take 1 tablet (5 mg total) by mouth daily.  [name] neomycin-bacitracin-polymyxin (NEOSPORIN) ointment Apply 1 application topically daily as needed for wound care.  . nitroGLYCERIN (NITROSTAT) 0.4 MG SL tablet Place 1 tablet (0.4 mg total) under the tongue every 5 (five) minutes x 3 doses  as needed for chest pain.  . polyethylene glycol (MIRALAX / GLYCOLAX) packet Take 17 g by mouth daily as needed for mild constipation or moderate constipation.  . rosuvastatin (CRESTOR) 10 MG tablet Take 10 mgs by mouth once daily in the evening  . sacubitril-valsartan (ENTRESTO) 49-51 MG Take 1 tablet by mouth 2 (two) times daily.  [name] spironolactone (ALDACTONE) 25 MG tablet Take 0.5 tablets (12.5 mg total) by mouth daily.  [name] warfarin (COUMADIN) 5 MG tablet Take 36m by mouth once daily in the evening except Tuesdays and Saturdays take 7.577monce daily in the evening (Patient taking differently: Take 5-7.5 mg See admin instructions by mouth. Take 7.5 mg by mouth daily on Monday, Wednesday and Friday. Take 5 mg by mouth daily on all other days)  . [DISCONTINUED] furosemide (LASIX) 20 MG tablet TAKE ONE TABLET BY MOUTH ONCE DAILY  . [DISCONTINUED] isosorbide mononitrate (IMDUR) 30 MG 24 hr tablet Take 1 tablet (30 mg total) by mouth  daily.  . [DISCONTINUED] levothyroxine (SYNTHROID, LEVOTHROID) 75 MCG tablet Take 1 tablet (75 mcg total) by mouth daily before breakfast.  . [DISCONTINUED] linagliptin (TRADJENTA) 5 MG TABS tablet Take 1 tablet (5 mg total) by mouth daily.   No facility-administered encounter medications on file as of 10/04/2017.    ALLERGIES: Allergies  Allergen Reactions  . Xanax [Alprazolam] Other (See Comments)    Pt feels very weak, tired and feels paralyzed     VACCINATION STATUS: Immunization History  Administered Date(s) Administered  . Influenza,inj,Quad PF,6+ Mos 08/01/2016, 07/11/2017    Diabetes  He presents for his follow-up diabetic visit. He has type 2 diabetes mellitus. Onset time: He was diagnosed at approximate age of [age]. His disease course has been worsening. There are no hypoglycemic associated symptoms. Pertinent negatives for hypoglycemia include no confusion, headaches, pallor or seizures. There are no diabetic associated symptoms. Pertinent negatives for diabetes include no chest pain, no fatigue, no polydipsia, no polyphagia, no polyuria and no weakness. There are no hypoglycemic complications. Symptoms are worsening. Diabetic complications include heart disease. Risk factors for coronary artery disease include diabetes mellitus, dyslipidemia, hypertension, male sex and sedentary lifestyle. Current diabetic treatment includes oral agent (dual therapy). He is compliant with treatment most of the time. His weight is increasing steadily. He is following a generally unhealthy diet. He participates in exercise intermittently. His home blood glucose trend is decreasing steadily. An ACE inhibitor/angiotensin II receptor blocker is being taken.  Hyperlipidemia  This is a chronic problem. The current episode started more than 1 year ago. Exacerbating diseases include diabetes. Pertinent negatives include no chest pain, myalgias or shortness of breath. Current antihyperlipidemic treatment  includes statins. Risk factors for coronary artery disease include diabetes mellitus, dyslipidemia, hypertension and male sex.  Hypertension  This is a chronic problem. The current episode started more than 1 year ago. Pertinent negatives include no chest pain, headaches, neck pain, palpitations or shortness of breath. Risk factors for coronary artery disease include diabetes mellitus and dyslipidemia. Hypertensive end-organ damage includes CAD/MI.    Review of Systems  Constitutional: Negative for fatigue and unexpected weight change.  HENT: Negative for dental problem, mouth sores and trouble swallowing.   Eyes: Negative for visual disturbance.  Respiratory: Negative for cough, choking, chest tightness, shortness of breath and wheezing.   Cardiovascular: Negative for chest pain, palpitations and leg swelling.  Gastrointestinal: Negative for abdominal distention, abdominal pain, constipation, diarrhea, nausea and vomiting.  Endocrine: Negative for polydipsia, polyphagia and polyuria.  Genitourinary:  Negative for dysuria, flank pain, hematuria and urgency.  Musculoskeletal: Negative for back pain, gait problem, myalgias and neck pain.  Skin: Negative for pallor, rash and wound.  Neurological: Negative for seizures, syncope, weakness, numbness and headaches.  Psychiatric/Behavioral: Negative.  Negative for confusion and dysphoric mood.    Objective:    BP 134/79   Pulse 70   Ht 6' (1.829 m)   Wt 180 lb (81.6 kg)   BMI 24.41 kg/m   Wt Readings from Last 3 Encounters:  10/04/17 180 lb (81.6 kg)  09/04/17 175 lb (79.4 kg)  08/23/17 180 lb 1.6 oz (81.7 kg)    Physical Exam  Constitutional: He is oriented to person, place, and time. He appears well-developed and well-nourished. He is cooperative. No distress.  HENT:  Head: Normocephalic and atraumatic.  Eyes: EOM are normal.  Neck: Normal range of motion. Neck supple. No tracheal deviation present. No thyromegaly present.   Cardiovascular: Normal rate, S1 normal, S2 normal and normal heart sounds. Exam reveals no gallop.  No murmur heard. Pulses:      Dorsalis pedis pulses are 1+ on the right side, and 1+ on the left side.       Posterior tibial pulses are 1+ on the right side, and 1+ on the left side.  Pulmonary/Chest: Breath sounds normal. No respiratory distress. He has no wheezes.  Abdominal: Soft. Bowel sounds are normal. He exhibits no distension. There is no tenderness. There is no guarding and no CVA tenderness.  Musculoskeletal: He exhibits no edema.       Right shoulder: He exhibits no swelling and no deformity.  Neurological: He is alert and oriented to person, place, and time. He has normal strength and normal reflexes. No cranial nerve deficit or sensory deficit. Gait normal.  Skin: Skin is warm and dry. No rash noted. No cyanosis. Nails show no clubbing.  Psychiatric: He has a normal mood and affect. His speech is normal and behavior is normal. Judgment and thought content normal. Cognition and memory are normal.    Chemistry (most recent): Lab Results  Component Value Date   NA 138 08/23/2017   K 4.9 08/23/2017   CL 106 08/23/2017   CO2 27 08/23/2017   BUN 39 (A) 10/01/2017   CREATININE 1.9 (A) 10/01/2017   Recent Results (from the past 2160 hour(s))  POCT INR     Status: None   Collection Time: 07/11/17 11:03 AM  Result Value Ref Range   INR 2.3   POCT INR     Status: Abnormal   Collection Time: 07/30/17 10:16 AM  Result Value Ref Range   INR 1.8   CUP PACEART REMOTE DEVICE CHECK     Status: None   Collection Time: 07/31/17  7:17 AM  Result Value Ref Range   Date Time Interrogation Session [id number]    Pulse Generator Manufacturer MERM    Pulse Gen Model [id number] Claria MRI Quad CRT-D    Pulse Gen Serial Number [id number]    Clinic Name [facility] Pulse Generator Type Cardiac Resynch Therapy Defibulator    Implantable Pulse Generator Implant Date  58592924    Implantable Lead Manufacturer [facility]    Implantable Lead Model 4598 Attain Performa S    Implantable Lead Serial Number [id number]    Implantable Lead Implant Date [id number]    Implantable Lead Location Detail 1 UNKNOWN    Implantable Lead Location [id number]    Implantable Lead Manufacturer MERM    Implantable  Lead Model 5076 CapSureFix Novus MRI SureScan    Implantable Lead Serial Number [id number]    Implantable Lead Implant Date 36144315    Implantable Lead Location Detail 1 APPENDAGE    Implantable Lead Location [id number]    Implantable Lead Manufacturer [facility]    Implantable Lead Model [id number] Sprint Quattro Secure S MRI SureScan    Implantable Lead Serial Number [id number]    Implantable Lead Implant Date 76195093    Implantable Lead Location Detail 1 APEX    Implantable Lead Location [id number]    Lead Channel Setting Sensing Sensitivity 0.3 mV   Lead Channel Setting Pacing Amplitude 1.75 V   Lead Channel Setting Pacing Pulse Width 0.4 ms   Lead Channel Setting Pacing Amplitude 2 V   Lead Channel Setting Pacing Pulse Width 1 ms   Lead Channel Setting Pacing Amplitude 2.25 V   Lead Channel Setting Pacing Capture Mode Adaptive Capture    Lead Channel Impedance Value 418 ohm   Lead Channel Sensing Intrinsic Amplitude 0.625 mV   Lead Channel Sensing Intrinsic Amplitude 0.625 mV   Lead Channel Pacing Threshold Amplitude 0.875 V   Lead Channel Pacing Threshold Pulse Width 0.4 ms   Lead Channel Impedance Value 456 ohm   Lead Channel Impedance Value 342 ohm   Lead Channel Sensing Intrinsic Amplitude 11.875 mV   Lead Channel Sensing Intrinsic Amplitude 11.875 mV   Lead Channel Pacing Threshold Amplitude 0.625 V   Lead Channel Pacing Threshold Pulse Width 0.4 ms   HighPow Impedance 49 ohm   Lead Channel Impedance Value 646 ohm   Lead Channel Impedance Value 646 ohm   Lead Channel Impedance Value 646 ohm   Lead Channel Impedance Value 475 ohm   Lead Channel Impedance Value 646 ohm    Lead Channel Impedance Value 646 ohm   Lead Channel Impedance Value 399 ohm   Lead Channel Impedance Value 361 ohm   Lead Channel Impedance Value 361 ohm   Lead Channel Impedance Value 399 ohm   Lead Channel Impedance Value 189.525    Lead Channel Impedance Value 189.525    Lead Channel Impedance Value 199.5 ohm   Lead Channel Impedance Value 189.525    Lead Channel Impedance Value 189.525    Lead Channel Pacing Threshold Amplitude 1.25 V   Lead Channel Pacing Threshold Pulse Width 1 ms   Battery Status OK    Battery Remaining Longevity 81 mo   Battery Voltage 2.99 V   Brady Statistic RA Percent Paced 72.18 %   Brady Statistic RV Percent Paced 71.79 %   Brady Statistic AP VP Percent 77.98 %   Brady Statistic AS VP Percent 20.54 %   Brady Statistic AP VS Percent 1.09 %   Brady Statistic AS VS Percent 0.39 %   Eval Rhythm AsBp   POCT INR     Status: Normal   Collection Time: 08/15/17  9:48 AM  Result Value Ref Range   INR 2.1   Glucose, capillary     Status: Abnormal   Collection Time: 08/17/17  8:41 AM  Result Value Ref Range   Glucose-Capillary 123 (H) 65 - 99 mg/dL  Protime-INR     Status: Abnormal   Collection Time: 08/17/17  8:57 AM  Result Value Ref Range   Prothrombin Time 18.2 (H) 11.4 - 15.2 seconds   INR 1.52   CBC     Status: Abnormal   Collection Time: 08/17/17  8:57 AM  Result Value Ref Range   WBC  6.5 4.0 - 10.5 K/uL   RBC 4.39 4.22 - 5.81 MIL/uL   Hemoglobin 13.6 13.0 - 17.0 g/dL   HCT 41.0 39.0 - 52.0 %   MCV 93.4 78.0 - 100.0 fL   MCH 31.0 26.0 - 34.0 pg   MCHC 33.2 30.0 - 36.0 g/dL   RDW 13.7 11.5 - 15.5 %   Platelets 147 (L) 150 - 400 K/uL  Basic metabolic panel     Status: Abnormal   Collection Time: 08/17/17  8:57 AM  Result Value Ref Range   Sodium 137 135 - 145 mmol/L   Potassium 4.5 3.5 - 5.1 mmol/L   Chloride 104 101 - 111 mmol/L   CO2 27 22 - 32 mmol/L   Glucose, Bld 104 (H) 65 - 99 mg/dL   BUN 29 (H) 6 - 20 mg/dL   Creatinine, Ser 1.86  (H) 0.61 - 1.24 mg/dL   Calcium 9.2 8.9 - 10.3 mg/dL   GFR calc non Af Amer 35 (L) >60 mL/min   GFR calc Af Amer 41 (L) >60 mL/min    Comment: (NOTE) The eGFR has been calculated using the CKD EPI equation. This calculation has not been validated in all clinical situations. eGFR's persistently <60 mL/min signify possible Chronic Kidney Disease.    Anion gap 6 5 - 15  Glucose, capillary     Status: Abnormal   Collection Time: 08/20/17 10:12 AM  Result Value Ref Range   Glucose-Capillary 147 (H) 65 - 99 mg/dL  Glucose, capillary     Status: None   Collection Time: 08/20/17 12:30 PM  Result Value Ref Range   Glucose-Capillary 87 65 - 99 mg/dL  Surgical pcr screen     Status: None   Collection Time: 08/20/17  9:28 PM  Result Value Ref Range   MRSA, PCR NEGATIVE NEGATIVE   Staphylococcus aureus NEGATIVE NEGATIVE    Comment: (NOTE) The Xpert SA Assay (FDA approved for NASAL specimens in patients 97 years of age and older), is one component of a comprehensive surveillance program. It is not intended to diagnose infection nor to guide or monitor treatment.   Troponin I     Status: Abnormal   Collection Time: 08/21/17 11:15 AM  Result Value Ref Range   Troponin I 0.12 (HH) <0.03 ng/mL    Comment: CRITICAL RESULT CALLED TO, READ BACK BY AND VERIFIED WITH: C HUCKELBEE,RN [phone] WILDERK   Troponin I     Status: Abnormal   Collection Time: 08/21/17  1:44 PM  Result Value Ref Range   Troponin I 1.23 (HH) <0.03 ng/mL    Comment: CRITICAL RESULT CALLED TO, READ BACK BY AND VERIFIED WITH: D.LEWIS,RN 1458 08/21/17 CLARK,S   Troponin I     Status: Abnormal   Collection Time: 08/21/17  7:47 PM  Result Value Ref Range   Troponin I 12.27 (HH) <0.03 ng/mL    Comment: CRITICAL RESULT CALLED TO, READ BACK BY AND VERIFIED WITH: CAGUIOA N,RN 08/21/17 2114 WAYK   Heparin level (unfractionated)     Status: Abnormal   Collection Time: 08/21/17  7:47 PM  Result Value Ref Range   Heparin  Unfractionated 0.25 (L) 0.30 - 0.70 IU/mL    Comment:        IF HEPARIN RESULTS ARE BELOW EXPECTED VALUES, AND PATIENT DOSAGE HAS BEEN CONFIRMED, SUGGEST FOLLOW UP TESTING OF ANTITHROMBIN III LEVELS.   Basic metabolic panel     Status: Abnormal   Collection Time: 08/22/17  7:35 AM  Result  Value Ref Range   Sodium 138 135 - 145 mmol/L   Potassium 4.7 3.5 - 5.1 mmol/L   Chloride 105 101 - 111 mmol/L   CO2 28 22 - 32 mmol/L   Glucose, Bld 126 (H) 65 - 99 mg/dL   BUN 36 (H) 6 - 20 mg/dL   Creatinine, Ser 1.89 (H) 0.61 - 1.24 mg/dL   Calcium 8.6 (L) 8.9 - 10.3 mg/dL   GFR calc non Af Amer 35 (L) >60 mL/min   GFR calc Af Amer 40 (L) >60 mL/min    Comment: (NOTE) The eGFR has been calculated using the CKD EPI equation. This calculation has not been validated in all clinical situations. eGFR's persistently <60 mL/min signify possible Chronic Kidney Disease.    Anion gap 5 5 - 15  CBC with Differential/Platelet     Status: Abnormal   Collection Time: 08/22/17  7:35 AM  Result Value Ref Range   WBC 11.4 (H) 4.0 - 10.5 K/uL   RBC 3.74 (L) 4.22 - 5.81 MIL/uL   Hemoglobin 11.7 (L) 13.0 - 17.0 g/dL   HCT 35.8 (L) 39.0 - 52.0 %   MCV 95.7 78.0 - 100.0 fL   MCH 31.3 26.0 - 34.0 pg   MCHC 32.7 30.0 - 36.0 g/dL   RDW 14.4 11.5 - 15.5 %   Platelets 127 (L) 150 - 400 K/uL   Neutrophils Relative % 77 %   Neutro Abs 8.8 (H) 1.7 - 7.7 K/uL   Lymphocytes Relative 14 %   Lymphs Abs 1.6 0.7 - 4.0 K/uL   Monocytes Relative 7 %   Monocytes Absolute 0.8 0.1 - 1.0 K/uL   Eosinophils Relative 2 %   Eosinophils Absolute 0.2 0.0 - 0.7 K/uL   Basophils Relative 0 %   Basophils Absolute 0.0 0.0 - 0.1 K/uL  Digoxin level     Status: Abnormal   Collection Time: 08/22/17  7:35 AM  Result Value Ref Range   Digoxin Level 0.4 (L) 0.8 - 2.0 ng/mL  Protime-INR     Status: None   Collection Time: 08/22/17  7:35 AM  Result Value Ref Range   Prothrombin Time 13.4 11.4 - 15.2 seconds   INR 1.03    Troponin I     Status: Abnormal   Collection Time: 08/22/17  7:38 AM  Result Value Ref Range   Troponin I 8.67 (HH) <0.03 ng/mL    Comment: CRITICAL VALUE NOTED.  VALUE IS CONSISTENT WITH PREVIOUSLY REPORTED AND CALLED VALUE.  Glucose, capillary     Status: None   Collection Time: 08/22/17 12:05 PM  Result Value Ref Range   Glucose-Capillary 95 65 - 99 mg/dL  ECHOCARDIOGRAM COMPLETE     Status: None   Collection Time: 08/22/17  1:50 PM  Result Value Ref Range   Weight 2,872 oz   Height 72 in   BP 96/37 mmHg  Glucose, capillary     Status: Abnormal   Collection Time: 08/22/17  4:14 PM  Result Value Ref Range   Glucose-Capillary 136 (H) 65 - 99 mg/dL   Comment 1 Notify RN   Glucose, capillary     Status: Abnormal   Collection Time: 08/22/17  9:08 PM  Result Value Ref Range   Glucose-Capillary 151 (H) 65 - 99 mg/dL  CBC     Status: Abnormal   Collection Time: 08/23/17  3:53 AM  Result Value Ref Range   WBC 8.1 4.0 - 10.5 K/uL   RBC 3.84 (L) 4.22 -  5.81 MIL/uL   Hemoglobin 11.8 (L) 13.0 - 17.0 g/dL   HCT 36.6 (L) 39.0 - 52.0 %   MCV 95.3 78.0 - 100.0 fL   MCH 30.7 26.0 - 34.0 pg   MCHC 32.2 30.0 - 36.0 g/dL   RDW 14.1 11.5 - 15.5 %   Platelets 120 (L) 150 - 400 K/uL  Protime-INR     Status: None   Collection Time: 08/23/17  3:53 AM  Result Value Ref Range   Prothrombin Time 13.6 11.4 - 15.2 seconds   INR 6.14   Basic metabolic panel     Status: Abnormal   Collection Time: 08/23/17  3:53 AM  Result Value Ref Range   Sodium 138 135 - 145 mmol/L   Potassium 4.9 3.5 - 5.1 mmol/L   Chloride 106 101 - 111 mmol/L   CO2 27 22 - 32 mmol/L   Glucose, Bld 120 (H) 65 - 99 mg/dL   BUN 28 (H) 6 - 20 mg/dL   Creatinine, Ser 1.63 (H) 0.61 - 1.24 mg/dL   Calcium 8.3 (L) 8.9 - 10.3 mg/dL   GFR calc non Af Amer 41 (L) >60 mL/min   GFR calc Af Amer 48 (L) >60 mL/min    Comment: (NOTE) The eGFR has been calculated using the CKD EPI equation. This calculation has not been validated in  all clinical situations. eGFR's persistently <60 mL/min signify possible Chronic Kidney Disease.    Anion gap 5 5 - 15  Glucose, capillary     Status: None   Collection Time: 08/23/17  7:55 AM  Result Value Ref Range   Glucose-Capillary 92 65 - 99 mg/dL  Glucose, capillary     Status: Abnormal   Collection Time: 08/23/17 11:16 AM  Result Value Ref Range   Glucose-Capillary 103 (H) 65 - 99 mg/dL   Comment 1 Notify RN   POCT INR     Status: Abnormal   Collection Time: 08/29/17  3:21 PM  Result Value Ref Range   INR 1.3   POCT INR     Status: Normal   Collection Time: 09/03/17  9:57 AM  Result Value Ref Range   INR 2.6   POCT INR     Status: Normal   Collection Time: 09/17/17  3:55 PM  Result Value Ref Range   INR 2.3   Basic metabolic panel     Status: Abnormal   Collection Time: 10/01/17 12:00 AM  Result Value Ref Range   BUN 39 (A) 4 - 21   Creatinine 1.9 (A) 0.6 - 1.3  Hemoglobin A1c     Status: None   Collection Time: 10/01/17 12:00 AM  Result Value Ref Range   Hemoglobin A1C 6.3   TSH     Status: None   Collection Time: 10/01/17 12:00 AM  Result Value Ref Range   TSH 4.88 0.41 - 5.90    Comment: free t4 1.4    Assessment & Plan:   1. Type 2 diabetes mellitus with other circulatory complication ([place]), stage 3 CKD   his diabetes is  complicated by coronary artery disease status post coronary artery bypass graft and recent ACS and patient remains at a high risk for more acute and chronic complications of diabetes which include CAD, CVA, CKD, retinopathy, and neuropathy. These are all discussed in detail with the patient.  He continues to do well without insulin treatment,  A1c slightly higher at 6.3%. His renal function continues to decline to stage 3 - I  reviewed his recent labs. - I have re-counseled the patient on diet management  by adopting a carbohydrate restricted / protein rich  Diet.  -  Suggestion is made for him to avoid simple carbohydrates  from his  diet including Cakes, Sweet Desserts / Pastries, Ice Cream, Soda (diet and regular), Sweet Tea, Candies, Chips, Cookies, Store Bought Juices, Alcohol in Excess of  1-2 drinks a day, Artificial Sweeteners, and "Sugar-free" Products. This will help patient to have stable blood glucose profile and potentially avoid unintended weight gain.   - Patient is advised to stick to a routine mealtimes to eat 3 meals  a day and avoid unnecessary snacks ( to snack only to correct hypoglycemia).   - I have approached patient with the following individualized plan to manage diabetes and patient agrees.  -   His only oral option for treating diabetes is DPP Iv inhibitors, I advised him to continue Tradjenta 5 minute grams by mouth every morning.  - He will not need insulin treatment at this time.  - Patient specific target  for A1c; LDL, HDL, Triglycerides, and  Waist Circumference were discussed in detail.  2) BP/HTN: controlled.  reportedly he runs low blood pressure at home and would not tolerate any additional therapy. I advised him to continue his current medications including beta blocker, spironolactone and,  Lasix as needed.  3) Lipids/HPL: Controlled with  LDL at 63 in July 2018, continue statins. 4)  Weight/Diet: CDE consult in progress, exercise, and carbohydrates information provided.  5) hypothyroidism: - This is related to his therapy with amiodarone. - Based on his thyroid function tests, he would benefit from a higher dose of levothyroxine.   - I will increase his levothyroxine to 88 g by mouth every morning.    - We discussed about correct intake of levothyroxine, at fasting, with water, separated by at least 30 minutes from breakfast, and separated by more than 4 hours from calcium, iron, multivitamins, acid reflux medications (PPIs). -Patient is made aware of the fact that thyroid hormone replacement is needed for life, dose to be adjusted by periodic monitoring of thyroid function  tests.  6 ) vitamin D deficiency: He is status post therapy with  vitamin D 50,000 units weekly for 12 weeks. He will need vitamin D checked after next visit.  7) Chronic Care/Health Maintenance:  -Patient is  on ACEI/ARB and Statin medications and encouraged to continue to follow up with Ophthalmology, Podiatrist at least yearly or according to recommendations, and advised to  stay away from smoking. I have recommended yearly flu vaccine and pneumonia vaccination at least every 5 years, and  sleep for at least 7 hours a day.  I advised patient to maintain close follow up with his cardiologist and his  PCP for primary care needs.  Follow up plan: Return in about 6 months (around 04/03/2018) for follow up with pre-visit labs.  Glade Lloyd, MD Phone: [phone]  Fax: [phone]   This note was partially dictated with voice recognition software. Similar sounding words can be transcribed inadequately or may not  be corrected upon review.  10/04/2017, 9:12 AM

## 2017-10-04 NOTE — Patient Instructions (Signed)

## 2017-10-05 DIAGNOSIS — I252 Old myocardial infarction: Secondary | ICD-10-CM | POA: Diagnosis not present

## 2017-10-08 ENCOUNTER — Encounter (HOSPITAL_COMMUNITY): Payer: Self-pay

## 2017-10-08 ENCOUNTER — Ambulatory Visit (INDEPENDENT_AMBULATORY_CARE_PROVIDER_SITE_OTHER): Payer: Medicare Other | Admitting: *Deleted

## 2017-10-08 ENCOUNTER — Other Ambulatory Visit (HOSPITAL_COMMUNITY): Payer: Self-pay | Admitting: *Deleted

## 2017-10-08 DIAGNOSIS — Z953 Presence of xenogenic heart valve: Secondary | ICD-10-CM

## 2017-10-08 DIAGNOSIS — I4892 Unspecified atrial flutter: Secondary | ICD-10-CM

## 2017-10-08 DIAGNOSIS — I483 Typical atrial flutter: Secondary | ICD-10-CM

## 2017-10-08 DIAGNOSIS — Z5181 Encounter for therapeutic drug level monitoring: Secondary | ICD-10-CM | POA: Diagnosis not present

## 2017-10-08 DIAGNOSIS — I252 Old myocardial infarction: Secondary | ICD-10-CM | POA: Diagnosis not present

## 2017-10-08 LAB — POCT INR: INR: 2

## 2017-10-08 MED ORDER — SACUBITRIL-VALSARTAN 49-51 MG PO TABS: 1.0000 | ORAL_TABLET | Freq: Two times a day (BID) | ORAL | 3 refills | Status: DC

## 2017-10-10 DIAGNOSIS — I252 Old myocardial infarction: Secondary | ICD-10-CM | POA: Diagnosis not present

## 2017-10-15 DIAGNOSIS — I252 Old myocardial infarction: Secondary | ICD-10-CM | POA: Diagnosis not present

## 2017-10-17 DIAGNOSIS — I252 Old myocardial infarction: Secondary | ICD-10-CM | POA: Diagnosis not present

## 2017-10-18 ENCOUNTER — Encounter (HOSPITAL_COMMUNITY): Payer: Self-pay | Admitting: Cardiology

## 2017-10-18 ENCOUNTER — Other Ambulatory Visit: Payer: Self-pay

## 2017-10-18 ENCOUNTER — Ambulatory Visit (HOSPITAL_COMMUNITY)
Admission: RE | Admit: 2017-10-18 | Discharge: 2017-10-18 | Disposition: A | Discharge status: Home / Self Care | Payer: Medicare Other | Source: Ambulatory Visit | Attending: Cardiology | Admitting: Cardiology

## 2017-10-18 VITALS — BP 143/85 | HR 81 | Wt 181.2 lb

## 2017-10-18 DIAGNOSIS — I25119 Atherosclerotic heart disease of native coronary artery with unspecified angina pectoris: Secondary | ICD-10-CM | POA: Insufficient documentation

## 2017-10-18 DIAGNOSIS — E1122 Type 2 diabetes mellitus with diabetic chronic kidney disease: Secondary | ICD-10-CM | POA: Insufficient documentation

## 2017-10-18 DIAGNOSIS — I4892 Unspecified atrial flutter: Secondary | ICD-10-CM | POA: Diagnosis not present

## 2017-10-18 DIAGNOSIS — I255 Ischemic cardiomyopathy: Secondary | ICD-10-CM | POA: Diagnosis not present

## 2017-10-18 DIAGNOSIS — E785 Hyperlipidemia, unspecified: Secondary | ICD-10-CM | POA: Insufficient documentation

## 2017-10-18 DIAGNOSIS — Z79899 Other long term (current) drug therapy: Secondary | ICD-10-CM | POA: Insufficient documentation

## 2017-10-18 DIAGNOSIS — I5022 Chronic systolic (congestive) heart failure: Secondary | ICD-10-CM | POA: Diagnosis not present

## 2017-10-18 DIAGNOSIS — Z952 Presence of prosthetic heart valve: Secondary | ICD-10-CM | POA: Diagnosis not present

## 2017-10-18 DIAGNOSIS — Z95810 Presence of automatic (implantable) cardiac defibrillator: Secondary | ICD-10-CM | POA: Diagnosis not present

## 2017-10-18 DIAGNOSIS — I5042 Chronic combined systolic (congestive) and diastolic (congestive) heart failure: Secondary | ICD-10-CM | POA: Diagnosis not present

## 2017-10-18 DIAGNOSIS — Z7901 Long term (current) use of anticoagulants: Secondary | ICD-10-CM | POA: Diagnosis not present

## 2017-10-18 DIAGNOSIS — I252 Old myocardial infarction: Secondary | ICD-10-CM | POA: Diagnosis not present

## 2017-10-18 DIAGNOSIS — Z953 Presence of xenogenic heart valve: Secondary | ICD-10-CM | POA: Diagnosis not present

## 2017-10-18 DIAGNOSIS — I25810 Atherosclerosis of coronary artery bypass graft(s) without angina pectoris: Secondary | ICD-10-CM | POA: Diagnosis not present

## 2017-10-18 DIAGNOSIS — E039 Hypothyroidism, unspecified: Secondary | ICD-10-CM | POA: Diagnosis not present

## 2017-10-18 DIAGNOSIS — N183 Chronic kidney disease, stage 3 (moderate): Secondary | ICD-10-CM | POA: Insufficient documentation

## 2017-10-18 DIAGNOSIS — Z7982 Long term (current) use of aspirin: Secondary | ICD-10-CM | POA: Diagnosis not present

## 2017-10-18 LAB — BASIC METABOLIC PANEL
Anion gap: 9 (ref 5–15)
BUN: 34 mg/dL — AB (ref 6–20)
CO2: 26 mmol/L (ref 22–32)
CREATININE: 1.83 mg/dL — AB (ref 0.61–1.24)
Calcium: 9.4 mg/dL (ref 8.9–10.3)
Chloride: 104 mmol/L (ref 101–111)
GFR calc Af Amer: 42 mL/min — ABNORMAL LOW (ref >60)
GFR, EST NON AFRICAN AMERICAN: 36 mL/min — AB (ref >60)
Glucose, Bld: 129 mg/dL — ABNORMAL HIGH (ref 65–99)
POTASSIUM: 4.7 mmol/L (ref 3.5–5.1)
SODIUM: 139 mmol/L (ref 135–145)

## 2017-10-18 LAB — DIGOXIN LEVEL: Digoxin Level: 0.3 ng/mL — ABNORMAL LOW (ref 0.8–2.0)

## 2017-10-18 MED ORDER — CARVEDILOL 12.5 MG PO TABS: 12.5000 mg | ORAL_TABLET | Freq: Two times a day (BID) | ORAL | 3 refills | Status: DC

## 2017-10-18 NOTE — Progress Notes (Signed)
PCP: Dr Hilma Favors Cardiology: Dr. Sallyanne Kuster HF Cardiology: Dr. Aundra Dubin  72 yo with history of CAD s/p CABG in 2007 then redo CABG in 10/17 with mitral valve replacement presents for cardiology followup.  He was admitted in 10/17 for redo CABG with SVG-PDA and SVG-ramus.  He also had mitral valve replacement with a bioprosthetic valve because of infarct-related mitral regurgitation.  His EF on last study (10/17 TEE) was 20-25%.   Post-operative course was complicated by CHF requiring diuresis.  He also had atrial flutter and required DCCV.  Due to complete heart block, he later got a CRT-D system.    At a prior visit, he was in atrial flutter.  He saw Dr. Curt Bears, it was decided to arrange for DCCV with ablation down the road when PPM leads have been in longer.  He had successful DCCV in 12/17 and is in NSR today.   He was admitted in 3/18 with NSTEMI and chest pain. TnI only 0.5.  LHC showed occlusion of SVG-PDA from CABG#1 to be the likely culprit.  However, SVG-PDA from CABG#2 was patent.  No intervention.  Echo in 3/18 from [place] showed EF 30-35%, stable bioprosthetic mitral valve.   He had atrial flutter ablation in 8/18.  He is in NSR today and is off amiodarone.    In 11/18, he had an inguinal hernia repair. Post-op, he had an NSTEMI.  LHC showed occlusion of a PLV branch that had been backfilled by SVG-PDA (prior cath had shown severe diffuse disease in the PLV).  Echo in 11/18 showed EF 20-25% with normal bioprosthetic mitral valve.    Patient returns for followup of CHF today.  He continues to do cardiac rehab in [place].  He has been doing well recently.  He only has chest pain with stress => when he has a physician's appointment usually. He has not been having CP with exertion.  He is using about 2 NTG/month.  Very rare dyspnea.  He is walking for 10 minutes on a treadmill at rehab without problems.  No orthopnea/PND.   Optivol (personally reviewed): Rare atrial fibrillation, fluid  index < threshold, impedance stable.  >99% BiV pacing, no VT.  Labs (11/17): K 4.8, creatinine 1.89, hgb 12.3, digoxin 0.9, LFTs normal, TSH 8.235 (mild increase), free T3 low, free T4 normal, LFTs normal.  Labs (12/17): K 4.5, creatinine 1.7, BNP 3909 Labs (3/18): K 3.9, creatinine 1.48, hgb 11.3 Labs (4/18): digoxin 0.5, LFTs normal Labs (7/18): K 5.1, creatinine 1.78, LDL 63, HDL 29, TSH elevated Labs (11/18): K 4.5, creatinine 1.86, hgb 13.6 Labs (12/18): K 5, creatinine 1.88  PMH: 1. CAD: CABG 2007.  - LHC (9/17) with patent LIMA-LAD, totally occluded SVG-D, severe stenosis in SVG-PDA.  - Redo CABG 10/17 with SVG-PDA, SVG-ramus and mitral valve replacement.  - NSTEMI 3/18.  LHC with culprit lesion likely occlusion of SVG-PDA from CABG#1.  Patent SVG-PDA from CABG#2.  No intervention.  - NSTEMI 11/18 post-op inguinal hernia repair.  LHC with occlusion of a PLV branch that had been backfilled by SVG-PDA (prior cath had shown severe diffuse disease in the PLV).  2. Mitral regurgitation: Ischemic MR, mitral valve replacement was done in 10/17 (bioprosthetic). 3. Complete heart block: Post-op in 10/17.  Medtronic CRT-D placed.  4. Atrial flutter: DCCV 10/17 and again in 12/17.  - Ablation 8/18, now off amiodarone.  5. Type II diabetes 6. Hyperlipidemia 7. Chronic systolic CHF: Ischemic cardiomyopathy.   - TEE (10/17): EF 20-25%, severe LV dilation -  Echo (3/18, [place]): EF 30-35%, bioprosthetic mitral valve with mean gradient 5 mmHg.  - Echo (11/18): EF 20-25%, moderate LV dilation, normal bioprosthetic mitral valve.  8. Hypothyroidism  SH: Married, retired, lives in [place], nonsmoker, no ETOH.   Family History  Problem Relation Age of Onset  . Heart attack Mother   . Heart disease Mother   . Heart attack Father   . Heart disease Father   . Hypertension Father   . Hypertension Sister   . Heart disease Maternal Grandmother    ROS: All systems reviewed and negative except as  per HPI.   Current Outpatient Medications  Medication Sig Dispense Refill  . ACCU-CHEK SOFTCLIX LANCETS lancets Use as instructed. E11.65 100 each 2  . amoxicillin (AMOXIL) 500 MG tablet Take 4 tablets (2,000 mg total) by mouth as needed. Pre-dental work only. 4 tablet 2  . aspirin EC 81 MG tablet Take 81 mg by mouth daily.     . carvedilol (COREG) 12.5 MG tablet Take 1 tablet (12.5 mg total) by mouth 2 (two) times daily with a meal. 60 tablet 3  . digoxin (LANOXIN) 0.125 MG tablet Take 0.5 tablets (0.0625 mg total) by mouth daily. 15 tablet 11  . furosemide (LASIX) 20 MG tablet Take 1 tablet (20 mg total) by mouth every other day. 30 tablet 11  . levothyroxine (SYNTHROID, LEVOTHROID) 88 MCG tablet Take 1 tablet (88 mcg total) by mouth daily before breakfast. 90 tablet 2  . linagliptin (TRADJENTA) 5 MG TABS tablet Take 1 tablet (5 mg total) by mouth daily. 90 tablet 2  . neomycin-bacitracin-polymyxin (NEOSPORIN) ointment Apply 1 application topically daily as needed for wound care.    . nitroGLYCERIN (NITROSTAT) 0.4 MG SL tablet Place 1 tablet (0.4 mg total) under the tongue every 5 (five) minutes x 3 doses as needed for chest pain. 30 tablet 12  . polyethylene glycol (MIRALAX / GLYCOLAX) packet Take 17 g by mouth daily as needed for mild constipation or moderate constipation.    . rosuvastatin (CRESTOR) 10 MG tablet Take 10 mgs by mouth once daily in the evening 90 tablet 3  . sacubitril-valsartan (ENTRESTO) 49-51 MG Take 1 tablet by mouth 2 (two) times daily. 180 tablet 3  . spironolactone (ALDACTONE) 25 MG tablet Take 0.5 tablets (12.5 mg total) by mouth daily. 30 tablet 6  . warfarin (COUMADIN) 5 MG tablet Take 5mg  by mouth once daily in the evening except Tuesdays and Saturdays take 7.5mg  once daily in the evening (Patient taking differently: Take 5-7.5 mg See admin instructions by mouth. Take 7.5 mg by mouth daily on Monday, Wednesday and Friday. Take 5 mg by mouth daily on all other days) 45  tablet 3   No current facility-administered medications for this encounter.    BP (!) 143/85   Pulse 81   Wt 181 lb 4 oz (82.2 kg)   SpO2 100%   BMI 24.58 kg/m  General: NAD Neck: No JVD, no thyromegaly or thyroid nodule.  Lungs: Clear to auscultation bilaterally with normal respiratory effort. CV: Nondisplaced PMI.  Heart regular S1/S2, no S3/S4, 1/6 SEM RUSB.  No peripheral edema.  No carotid bruit.  Normal pedal pulses.  Abdomen: Soft, nontender, no hepatosplenomegaly, no distention.  Skin: Intact without lesions or rashes.  Neurologic: Alert and oriented x 3.  Psych: Normal affect. Extremities: No clubbing or cyanosis.  HEENT: Normal.    Assessment/Plan: 1. Chronic systolic CHF: Ischemic cardiomyopathy.  TEE 10/17 with EF 20-25%.  Has Medtronic  CRT-D system. Most recent echo from 11/18 with EF 20-25%.  On exam and by Optivol, he is not volume overloaded.  NYHA class II.  - Continue Lasix 20 mg every other day, BMET today.   - Continue Entresto 49/51 bid.   - Continue current spironolactone and digoxin.  Check digoxin level today. - Increase Coreg to 12.5 mg bid.   2. CAD: s/p redo CABG.  Admission in 3/18 with NSTEMI, LHC showed occlusion of SVG-PDA from CABG#1 but patent SVG-PDA from CABG#2, no intervention.  He had a post-op NSTEMI after inguinal hernia repair in 11/18.  LHC showed occlusion of a PLV branch that had been backfilled by SVG-PDA (prior cath had shown severe diffuse disease in the PLV). He only gets chest pain now with stress, not exertion.  Using about 2 NTGs/month.    - Continue statin => Crestor 10 mg daily. Good lipids in 7/18.  - Continue cardiac rehab.   - Increasing Coreg should raise anginal threshold.  - Continue ASA 81. - Avoid future elective surgery.  3. Bioprosthetic mitral valve: Stable on 11/18 echo. 4. Atrial flutter: s/p ablation in 8/18.  He is in NSR and off amiodarone.  - Continue warfarin.  5. CKD: Stage III.  BMET today.    Followup in 4  months, will see Dr Sallyanne Kuster in a month or so.    Loralie Champagne 10/18/2017

## 2017-10-18 NOTE — Patient Instructions (Signed)
Labs today (will call for abnormal results, otherwise no news is good news)  INCREASE Coreg to 12.5 mg (1 Tablet) Twice Daily  Follow up in 4 Months

## 2017-10-22 DIAGNOSIS — I252 Old myocardial infarction: Secondary | ICD-10-CM | POA: Diagnosis not present

## 2017-10-24 DIAGNOSIS — I252 Old myocardial infarction: Secondary | ICD-10-CM | POA: Diagnosis not present

## 2017-10-29 ENCOUNTER — Ambulatory Visit (INDEPENDENT_AMBULATORY_CARE_PROVIDER_SITE_OTHER): Payer: Medicare Other | Admitting: *Deleted

## 2017-10-29 DIAGNOSIS — I483 Typical atrial flutter: Secondary | ICD-10-CM

## 2017-10-29 DIAGNOSIS — I252 Old myocardial infarction: Secondary | ICD-10-CM | POA: Diagnosis not present

## 2017-10-29 DIAGNOSIS — Z953 Presence of xenogenic heart valve: Secondary | ICD-10-CM

## 2017-10-29 DIAGNOSIS — Z5181 Encounter for therapeutic drug level monitoring: Secondary | ICD-10-CM | POA: Diagnosis not present

## 2017-10-29 DIAGNOSIS — I4892 Unspecified atrial flutter: Secondary | ICD-10-CM

## 2017-10-29 LAB — POCT INR: INR: 2.1

## 2017-10-29 MED ORDER — WARFARIN SODIUM 5 MG PO TABS: ORAL_TABLET | ORAL | 3 refills | Status: DC

## 2017-10-30 ENCOUNTER — Ambulatory Visit (INDEPENDENT_AMBULATORY_CARE_PROVIDER_SITE_OTHER): Payer: Medicare Other | Admitting: *Deleted

## 2017-10-30 DIAGNOSIS — I255 Ischemic cardiomyopathy: Secondary | ICD-10-CM | POA: Diagnosis not present

## 2017-10-30 NOTE — Progress Notes (Signed)
Remote ICD transmission.   

## 2017-10-31 DIAGNOSIS — I252 Old myocardial infarction: Secondary | ICD-10-CM | POA: Diagnosis not present

## 2017-11-01 ENCOUNTER — Encounter: Payer: Self-pay | Admitting: Cardiology

## 2017-11-02 DIAGNOSIS — I252 Old myocardial infarction: Secondary | ICD-10-CM | POA: Diagnosis not present

## 2017-11-05 ENCOUNTER — Other Ambulatory Visit: Payer: Self-pay | Admitting: Cardiology

## 2017-11-07 DIAGNOSIS — I252 Old myocardial infarction: Secondary | ICD-10-CM | POA: Diagnosis not present

## 2017-11-09 DIAGNOSIS — I252 Old myocardial infarction: Secondary | ICD-10-CM | POA: Diagnosis not present

## 2017-11-12 DIAGNOSIS — I252 Old myocardial infarction: Secondary | ICD-10-CM | POA: Diagnosis not present

## 2017-11-14 DIAGNOSIS — I252 Old myocardial infarction: Secondary | ICD-10-CM | POA: Diagnosis not present

## 2017-11-14 LAB — CUP PACEART REMOTE DEVICE CHECK
Battery Voltage: 2.97 V
Brady Statistic AP VS Percent: 1.21 %
Brady Statistic AS VP Percent: 42.23 %
Brady Statistic RA Percent Paced: 54.57 %
Brady Statistic RV Percent Paced: 31.25 %
HighPow Impedance: 58 Ohm
Implantable Lead Implant Date: 20171024
Implantable Lead Implant Date: 20171024
Implantable Lead Location: 753859
Implantable Lead Location: 753860
Implantable Lead Model: 5076
Lead Channel Impedance Value: 189.525
Lead Channel Impedance Value: 189.525
Lead Channel Impedance Value: 199.5 Ohm
Lead Channel Impedance Value: 199.5 Ohm
Lead Channel Impedance Value: 361 Ohm
Lead Channel Impedance Value: 361 Ohm
Lead Channel Impedance Value: 399 Ohm
Lead Channel Impedance Value: 475 Ohm
Lead Channel Impedance Value: 513 Ohm
Lead Channel Impedance Value: 646 Ohm
Lead Channel Impedance Value: 665 Ohm
Lead Channel Impedance Value: 665 Ohm
Lead Channel Impedance Value: 665 Ohm
Lead Channel Pacing Threshold Amplitude: 0.875 V
Lead Channel Pacing Threshold Pulse Width: 0.4 ms
Lead Channel Pacing Threshold Pulse Width: 1 ms
Lead Channel Sensing Intrinsic Amplitude: 1 mV
Lead Channel Sensing Intrinsic Amplitude: 1 mV
Lead Channel Sensing Intrinsic Amplitude: 10.625 mV
Lead Channel Setting Pacing Amplitude: 1.75 V
Lead Channel Setting Pacing Amplitude: 2 V
Lead Channel Setting Pacing Amplitude: 2.25 V
Lead Channel Setting Pacing Pulse Width: 0.4 ms
MDC IDC LEAD IMPLANT DT: 20171024
MDC IDC LEAD LOCATION: 753858
MDC IDC MSMT BATTERY REMAINING LONGEVITY: 79 mo
MDC IDC MSMT LEADCHNL LV IMPEDANCE VALUE: 189.525
MDC IDC MSMT LEADCHNL LV IMPEDANCE VALUE: 399 Ohm
MDC IDC MSMT LEADCHNL LV IMPEDANCE VALUE: 399 Ohm
MDC IDC MSMT LEADCHNL LV IMPEDANCE VALUE: 646 Ohm
MDC IDC MSMT LEADCHNL LV PACING THRESHOLD AMPLITUDE: 1.25 V
MDC IDC MSMT LEADCHNL RA IMPEDANCE VALUE: 418 Ohm
MDC IDC MSMT LEADCHNL RV PACING THRESHOLD AMPLITUDE: 0.5 V
MDC IDC MSMT LEADCHNL RV PACING THRESHOLD PULSEWIDTH: 0.4 ms
MDC IDC MSMT LEADCHNL RV SENSING INTR AMPL: 10.625 mV
MDC IDC PG IMPLANT DT: 20171024
MDC IDC SESS DTM: 20190129180011
MDC IDC SET LEADCHNL LV PACING PULSEWIDTH: 1 ms
MDC IDC SET LEADCHNL RV SENSING SENSITIVITY: 0.3 mV
MDC IDC STAT BRADY AP VP PERCENT: 55.58 %
MDC IDC STAT BRADY AS VS PERCENT: 0.98 %

## 2017-11-19 ENCOUNTER — Ambulatory Visit (INDEPENDENT_AMBULATORY_CARE_PROVIDER_SITE_OTHER): Payer: Medicare Other | Admitting: *Deleted

## 2017-11-19 DIAGNOSIS — I4892 Unspecified atrial flutter: Secondary | ICD-10-CM

## 2017-11-19 DIAGNOSIS — I252 Old myocardial infarction: Secondary | ICD-10-CM | POA: Diagnosis not present

## 2017-11-19 DIAGNOSIS — Z953 Presence of xenogenic heart valve: Secondary | ICD-10-CM

## 2017-11-19 DIAGNOSIS — I483 Typical atrial flutter: Secondary | ICD-10-CM | POA: Diagnosis not present

## 2017-11-19 DIAGNOSIS — Z5181 Encounter for therapeutic drug level monitoring: Secondary | ICD-10-CM | POA: Diagnosis not present

## 2017-11-19 LAB — POCT INR: INR: 2

## 2017-11-19 NOTE — Patient Instructions (Signed)
Increase coumadin to 1 1/2 tablets daily  May have 1 Ensure daily Recheck in 3 weeks

## 2017-11-21 DIAGNOSIS — I252 Old myocardial infarction: Secondary | ICD-10-CM | POA: Diagnosis not present

## 2017-11-23 DIAGNOSIS — I252 Old myocardial infarction: Secondary | ICD-10-CM | POA: Diagnosis not present

## 2017-11-26 DIAGNOSIS — I252 Old myocardial infarction: Secondary | ICD-10-CM | POA: Diagnosis not present

## 2017-11-28 ENCOUNTER — Encounter: Payer: Self-pay | Admitting: Cardiovascular Disease

## 2017-11-28 ENCOUNTER — Ambulatory Visit (INDEPENDENT_AMBULATORY_CARE_PROVIDER_SITE_OTHER): Payer: Medicare Other | Admitting: Cardiovascular Disease

## 2017-11-28 VITALS — BP 134/76 | HR 68 | Ht 72.0 in | Wt 188.8 lb

## 2017-11-28 DIAGNOSIS — I10 Essential (primary) hypertension: Secondary | ICD-10-CM

## 2017-11-28 DIAGNOSIS — E785 Hyperlipidemia, unspecified: Secondary | ICD-10-CM | POA: Diagnosis not present

## 2017-11-28 DIAGNOSIS — Z9889 Other specified postprocedural states: Secondary | ICD-10-CM | POA: Diagnosis not present

## 2017-11-28 DIAGNOSIS — Z79899 Other long term (current) drug therapy: Secondary | ICD-10-CM

## 2017-11-28 DIAGNOSIS — Z953 Presence of xenogenic heart valve: Secondary | ICD-10-CM

## 2017-11-28 DIAGNOSIS — E119 Type 2 diabetes mellitus without complications: Secondary | ICD-10-CM

## 2017-11-28 DIAGNOSIS — Z95810 Presence of automatic (implantable) cardiac defibrillator: Secondary | ICD-10-CM

## 2017-11-28 DIAGNOSIS — I252 Old myocardial infarction: Secondary | ICD-10-CM | POA: Diagnosis not present

## 2017-11-28 DIAGNOSIS — I255 Ischemic cardiomyopathy: Secondary | ICD-10-CM

## 2017-11-28 DIAGNOSIS — I5042 Chronic combined systolic (congestive) and diastolic (congestive) heart failure: Secondary | ICD-10-CM | POA: Diagnosis not present

## 2017-11-28 DIAGNOSIS — I25718 Atherosclerosis of autologous vein coronary artery bypass graft(s) with other forms of angina pectoris: Secondary | ICD-10-CM

## 2017-11-28 DIAGNOSIS — E032 Hypothyroidism due to medicaments and other exogenous substances: Secondary | ICD-10-CM | POA: Diagnosis not present

## 2017-11-28 NOTE — Progress Notes (Signed)
Cardiology Office Note    Date:  11/29/2017   ID:  Christian Bowman, DOB [birth year], MRN [id number]  PCP:  Sharilyn Sites, MD  Cardiologist:  Loralie Champagne, MD; Allegra Lai, MD; Sanda Klein, MD   No chief complaint on file.   History of Present Illness:  Christian Bowman is a 72 y.o. male with extensive and severe cardiac problems.  He underwent redo coronary artery bypass 2 (SVG to PDA, SVG to ramus), as well as mitral valve replacement (bioprosthetic Edwards magna 31 mm) on 93/79/0240, complicated by AV block and atrial flutter requiring cardioversion. Estimated EF 20-25 % on his 07/18/16 TEE. A biventricular defibrillator (Medtronic Claria MRI CRT-D) was implanted on July 25, 2016.   On December 18, 2016 he was hospitalized for small non-STEMI and was found to have interval occlusion of the old SVG to PDA, while the more recently placed SVG was still patent. LVEDP was quite low at only 3 mmHg during that catheterization. He had atrial flutter ablation in August 2018 and is off amiodarone.   He had a NSTEMI in November 2018 after inguinal hernia repair surgery.  He seems to have recovered well from that.  He has occasional angina pectoris if he becomes emotional, rarely has any angina when he exercises in cardiac rehab.  Interestingly, his angina often occurs at a certain spot, while driving down the highway towards his doctor's appointments.  This is always associated with apprehension.  (They have even thought about taking a different route when they drive towards [facility].)  It did not happen today.  Occasionally uses sublingual nitroglycerin.  He has gained a little weight, but does not have any overt evidence of hypervolemia.  He is eating better.  He enjoys drinking an Ensure every day.  His wife makes sure that he eats healthy foods. He denies orthopnea, PND, palpitations, syncope, ICD discharges or bleeding.  As before, his wife keeps a very detailed log of his blood pressure and  weight and these are consistently very steady.  His blood pressure is substantially lower at home and at cardiac rehab than it is here today.  His weight is now in the 180s.  Device interrogation shows normal function.  He has a Medtronic [employer] CRT-D device that is currently expecting roughly 7 years of generator longevity.  Lead parameters all remain excellent.  He has a fairly good left ventricular pacing threshold of 0.75 V at 1.0 ms in the LV 1-LV 2 configuration.  He has 97% biventricular pacing (adaptive CRT, LV only pacing 58%) and also has 63% atrial pacing with a good heart rate histogram distribution.  He has not had any episodes of ventricular tachycardia or ventricular fibrillation.  A few reported episodes of mode switch all appear to be due to far field R wave oversensing.  His optive all did show a quick drop in thoracic impedance when he had his inguinal surgery and acute MI but this never really exceeded the hypervolemia threshold and quickly returned to baseline.  It has been stable ever since.  He understands the need for endocarditis prophylaxis with dental procedures or other medical procedures that might cause bacteremia and the indication for anticoagulation chronically.  He is scheduled to see Dr. Curt Bears in April and Dr. Aundra Dubin in September.   Past Medical History:  Diagnosis Date  . AICD (automatic cardioverter/defibrillator) present    medtronic-   DR. Masud Holub , DR. Aundra Dubin   . Anginal pain (HCC)    cp  sat 08/11/17  . Anxiety   . CAD (coronary artery disease)   . CHF (congestive heart failure) ([place])   . Complication of anesthesia    took awhile to wake up   . Coronary artery disease involving coronary bypass graft   . Cyst of neck    right side  . DM2 (diabetes mellitus, type 2) ([place]) 08/26/2013  . Dyspnea   . Heart attack ([place])    "not sure when" (08/20/2017)  . HTN (hypertension) 08/26/2013  . Hyperlipidemia 08/26/2013  . Hypothyroidism   . Left main  coronary artery disease   . Left renal artery stenosis ([place])    Genesis 6x12 stent 2007  . Obesity (BMI 30.0-34.9) 08/26/2013  . Postoperative atrial fibrillation ([place]) 10/15/2005  . Presence of permanent cardiac pacemaker   . S/P CABG x 4 10/13/2005   LIMA to LAD, SVG to intermediate branch, sequential SVG to PDA and RPL branch, EVH via right thigh  . S/P mitral valve replacement with bioprosthetic valve 07/11/2016   31 mm [facility] Mitral bovine bioprosthetic tissue valve  . S/P redo CABG x 2 07/11/2016   SVG to PDA and SVG to Intermediate Branch, EVH via left thigh    Past Surgical History:  Procedure Laterality Date  . A-FLUTTER ABLATION N/A 05/11/2017   Procedure: A-Flutter Ablation;  Surgeon: Constance Haw, MD;  Location: [place] [facility];  Service: Cardiovascular;  Laterality: N/A;  . CARDIAC CATHETERIZATION N/A 06/21/2016   Procedure: Right/Left Heart Cath and Coronary/Graft Angiography;  Surgeon: Sherren Mocha, MD;  Location: [place] [facility];  Service: Cardiovascular;  Laterality: N/A;  . CARDIAC VALVE REPLACEMENT    . CARDIOVERSION N/A 07/19/2016   Procedure: CARDIOVERSION;  Surgeon: Lelon Perla, MD;  Location: [facility] ENDOSCOPY;  Service: Cardiovascular;  Laterality: N/A;  . CARDIOVERSION N/A 09/08/2016   Procedure: CARDIOVERSION;  Surgeon: Fay Records, MD;  Location: [place];  Service: Cardiovascular;  Laterality: N/A;  . CORONARY ANGIOPLASTY     STENT 2016  [place]     DES in SVG to right coronary artery system  . CORONARY ARTERY BYPASS GRAFT  10/13/2005   LIMA to LAD, SVG to intermediate branch, sequential SVG to PDA and RPL  . CORONARY ARTERY BYPASS GRAFT N/A 07/11/2016   Procedure: REDO CORONARY ARTERY BYPASS GRAFTING (CABG) x two using left leg greater saphenous vein harvested endoscopically-SVG to PDA -SVG to RAMUS INTERMEDIATE;  Surgeon: Rexene Alberts, MD;  Location: [place];  Service: Open  Heart Surgery;  Laterality: N/A;  . CORONARY ARTERY BYPASS GRAFT N/A 07/11/2016   Procedure: Re-exploration (CABG) for post op bleeding,;  Surgeon: Rexene Alberts, MD;  Location: [place];  Service: Open Heart Surgery;  Laterality: N/A;  . EP IMPLANTABLE DEVICE N/A 07/25/2016   Procedure: BiV ICD Insertion CRT-D;  Surgeon: Will Meredith Leeds, MD;  Location: [place] [facility];  Service: Cardiovascular;  Laterality: N/A;  . INGUINAL HERNIA REPAIR Left 08/20/2017  . INGUINAL HERNIA REPAIR Left 08/20/2017   Procedure: OPEN LEFT INGUINAL HERNIA REPAIR;  Surgeon: Alphonsa Overall, MD;  Location: [place];  Service: General;  Laterality: Left;  . LEFT HEART CATH AND CORS/GRAFTS ANGIOGRAPHY N/A 12/18/2016   Procedure: Left Heart Cath and Cors/Grafts Angiography;  Surgeon: Sherren Mocha, MD;  Location: [place] [facility];  Service: Cardiovascular;  Laterality: N/A;  . LEFT HEART CATH AND CORS/GRAFTS ANGIOGRAPHY N/A 08/22/2017   Procedure: LEFT HEART CATH AND CORS/GRAFTS  ANGIOGRAPHY;  Surgeon: Larey Dresser, MD;  Location: [place] [facility];  Service: Cardiovascular;  Laterality: N/A;  . MITRAL VALVE REPLACEMENT N/A 07/11/2016   Procedure: MITRAL VALVE (MV) REPLACEMENT;  Surgeon: Rexene Alberts, MD;  Location: [place];  Service: Open Heart Surgery;  Laterality: N/A;  . MYOCARDICAL PERFUSION  10/09/2007   NORMAL PERFUSION IN ALL REGIONS;NO EVIDENCE OF INDUCIBLE ISCHEMIA;POST STRESS EF% 66  . RENAL ARTERY STENT Right 2007  . RENAL DOPPLER  03/28/2010   RIGHT RA-NORMAL;LEFT PROXIMAL RA AT STENT-PATENT WITH NO EVIDENCE OF SIGN DIAMETER REDUCTION. R & L KIDNEYS: EQUAL IN SIZE,SYMMETRICAL IN SHAPE.  . TEE WITHOUT CARDIOVERSION N/A 06/15/2016   Procedure: TRANSESOPHAGEAL ECHOCARDIOGRAM (TEE);  Surgeon: Sanda Klein, MD;  Location: [facility] ENDOSCOPY;  Service: Cardiovascular;  Laterality: N/A;  . TEE WITHOUT CARDIOVERSION N/A 07/11/2016   Procedure: TRANSESOPHAGEAL ECHOCARDIOGRAM (TEE);  Surgeon: Rexene Alberts, MD;   Location: [place];  Service: Open Heart Surgery;  Laterality: N/A;  . TEE WITHOUT CARDIOVERSION N/A 07/19/2016   Procedure: TRANSESOPHAGEAL ECHOCARDIOGRAM (TEE);  Surgeon: Lelon Perla, MD;  Location: [facility] ENDOSCOPY;  Service: Cardiovascular;  Laterality: N/A;  . TRANSESOPHAGEAL ECHOCARDIOGRAM  10/19/2005   NORMAL LV; MILD TO MODERATE AMOUNT OF SOFT ATHEROMATOUS PLAQUE OF THE THORACIC AORTA; THE LEFT ATRIUM IS MILDLY DILATED;LEFT ATRIAL APPENDAGE FUNCTION IS NORMAL;NO THROMBUS IDENTIFIED. SMALL PFO WITH RIGHT TO LEFT SHUNT    Current Medications: Outpatient Medications Prior to Visit  Medication Sig Dispense Refill  . ACCU-CHEK SOFTCLIX LANCETS lancets Use as instructed. E11.65 100 each 2  . amoxicillin (AMOXIL) 500 MG tablet Take 4 tablets (2,000 mg total) by mouth as needed. Pre-dental work only. 4 tablet 2  . aspirin EC 81 MG tablet Take 81 mg by mouth daily.     . carvedilol (COREG) 12.5 MG tablet Take 1 tablet (12.5 mg total) by mouth 2 (two) times daily with a meal. 60 tablet 3  . digoxin (LANOXIN) 0.125 MG tablet Take 0.5 tablets (0.0625 mg total) by mouth daily. 15 tablet 11  . furosemide (LASIX) 20 MG tablet Take 1 tablet (20 mg total) by mouth every other day. 30 tablet 11  . levothyroxine (SYNTHROID, LEVOTHROID) 88 MCG tablet Take 1 tablet (88 mcg total) by mouth daily before breakfast. 90 tablet 2  . linagliptin (TRADJENTA) 5 MG TABS tablet Take 1 tablet (5 mg total) by mouth daily. 90 tablet 2  . neomycin-bacitracin-polymyxin (NEOSPORIN) ointment Apply 1 application topically daily as needed for wound care.    . nitroGLYCERIN (NITROSTAT) 0.4 MG SL tablet Place 1 tablet (0.4 mg total) under the tongue every 5 (five) minutes x 3 doses as needed for chest pain. 30 tablet 12  . polyethylene glycol (MIRALAX / GLYCOLAX) packet Take 17 g by mouth daily as needed for mild constipation or moderate constipation.    . rosuvastatin (CRESTOR) 10 MG tablet Take 10 mgs by mouth once daily in the  evening 90 tablet 3  . sacubitril-valsartan (ENTRESTO) 49-51 MG Take 1 tablet by mouth 2 (two) times daily. 180 tablet 3  . spironolactone (ALDACTONE) 25 MG tablet Take 0.5 tablets (12.5 mg total) by mouth daily. 30 tablet 6  . warfarin (COUMADIN) 5 MG tablet Take 1 1/2 tablets daily except 1 tablet on Sundays and Thursdays (Patient taking differently: Take 5 mg by mouth daily. Take 1 1/2 tablets daily except 1 tablet on Sundays and Thursdays) 45 tablet 3   No facility-administered medications prior to visit.  Allergies:   Xanax [alprazolam]   Social History   Socioeconomic History  . Marital status: Married    Spouse name: None  . Number of children: None  . Years of education: None  . Highest education level: None  Social Needs  . Financial resource strain: None  . Food insecurity - worry: None  . Food insecurity - inability: None  . Transportation needs - medical: None  . Transportation needs - non-medical: None  Occupational History  . None  Tobacco Use  . Smoking status: Never Smoker  . Smokeless tobacco: Never Used  Substance and Sexual Activity  . Alcohol use: No  . Drug use: No  . Sexual activity: No  Other Topics Concern  . None  Social History Narrative  . None     Family History:  The patient's family history includes Heart attack in his father and mother; Heart disease in his father, maternal grandmother, and mother; Hypertension in his father and sister.   ROS:   Please see the history of present illness.    ROS All other systems reviewed and are negative.   PHYSICAL EXAM:   VS:  BP 134/76   Pulse 68   Ht 6' (1.829 m)   Wt 188 lb 12.8 oz (85.6 kg)   BMI 25.61 kg/m      General: Alert, oriented x3, no distress, appears well Head: no evidence of trauma, PERRL, EOMI, no exophtalmos or lid lag, no myxedema, no xanthelasma; normal ears, nose and oropharynx Neck: normal jugular venous pulsations and no hepatojugular reflux; brisk carotid pulses  without delay and no carotid bruits.  Unchanged cystic structure right anterior neck Chest: clear to auscultation, no signs of consolidation by percussion or palpation, normal fremitus, symmetrical and full respiratory excursions.  Healthy left subclavian defibrillator site Cardiovascular: normal position and quality of the apical impulse, regular rhythm, normal first and second heart sounds, no murmurs, rubs or gallops Abdomen: no tenderness or distention, no masses by palpation, no abnormal pulsatility or arterial bruits, normal bowel sounds, no hepatosplenomegaly Extremities: no clubbing, cyanosis or edema; 2+ radial, ulnar and brachial pulses bilaterally; 2+ right femoral, posterior tibial and dorsalis pedis pulses; 2+ left femoral, posterior tibial and dorsalis pedis pulses; no subclavian or femoral bruits Neurological: grossly nonfocal Psych: Normal mood and affect   Wt Readings from Last 3 Encounters:  11/28/17 188 lb 12.8 oz (85.6 kg)  10/18/17 181 lb 4 oz (82.2 kg)  10/04/17 180 lb (81.6 kg)      Studies/Labs Reviewed:   EKG:  EKG is not ordered today.    Recent Labs: 12/15/2016: B Natriuretic Peptide >4,500.0 08/23/2017: Hemoglobin 11.8; Platelets 120 10/01/2017: ALT 18; TSH 4.880 10/18/2017: BUN 34; Creatinine, Ser 1.83; Potassium 4.7; Sodium 139   Lipid Panel    Component Value Date/Time   CHOL 114 04/03/2017 0815   TRIG 112 04/03/2017 0815   HDL 29 (L) 04/03/2017 0815   CHOLHDL 3.9 04/03/2017 0815   LDLCALC 63 04/03/2017 0815    ASSESSMENT:    1. Chronic combined systolic and diastolic heart failure ([place])   2. Medication management   3. Coronary artery disease of autologous vein bypass graft with stable angina pectoris ([place])   4. Status post mitral valve replacement with bioprosthetic valve   5. Biventricular ICD (implantable cardioverter-defibrillator) in place   6. Essential hypertension   7. Dyslipidemia   8. Diabetes mellitus type 2 in nonobese (HCC)   9.  History of stent insertion of renal artery  10. Hypothyroidism due to medication      PLAN:  In order of problems listed above:  1. CHF: NYHA class 2, appears clinically euvolemic.  His weight has increased and clearly we have to establish a new "dry weight" standard for him.  He is on carvedilol, Entresto and spironolactone, as well as a low dose of digoxin.  2. CAD: CCS class II he has actually had less angina pectoris since his myocardial infarction in November.  Currently the episodes generally happen only if he is very emotional. 3. AFlutter: No recurrence of arrhythmia since his successful ablation in August.  6 months have passed since amiodarone was discontinued.  Will discuss with Dr. Curt Bears whether or not warfarin could be stopped. 4. S/P MVR: Bioprosthetic valve with normal function by recent echo.  5. CRT-D: Excellent device function with 100% biventricular pacing.  Occasional far field R wave oversensing, without significant clinical impact 6. Hx HTN: His blood pressure still often on the low end of normal or even frankly low.  I refrained from making any additional increases in his heart failure medications, although it would be appealing to increase the dose of carvedilol to 25 mg twice daily. 7. HLP: On statin.  Excellent LDL level. 8. DM: Maintains good control despite some weight gain he should avoid gaining any additional weight. Recent hemoglobin A1c 6.3% 9. History of left renal artery stenosis status post stent 2007. 10. Hypothyroidism: Resolved after discontinuation of amiodarone and thyroid hormone supplementation.  Normal thyroid function tests December 31.     Medication Adjustments/Labs and Tests Ordered: Current medicines are reviewed at length with the patient today.  Concerns regarding medicines are outlined above.  Medication changes, Labs and Tests ordered today are listed in the Patient Instructions below. Patient Instructions  Dr Sallyanne Kuster recommends that  you continue on your current medications as directed. Please refer to the Current Medication list given to you today.  Remote monitoring is used to monitor your Pacemaker or ICD from home. This monitoring reduces the number of office visits required to check your device to one time per year. It allows [place] to keep an eye on the functioning of your device to ensure it is working properly. You are scheduled for a device check from home on Tuesday, April 30th, 2019. You may send your transmission at any time that day. If you have a wireless device, the transmission will be sent automatically. After your physician reviews your transmission, you will receive a notification with your next transmission date.  Dr Sallyanne Kuster recommends that you schedule a follow-up appointment in 5 months with an ICD check. You will receive a reminder letter in the mail two months in advance. If you don't receive a letter, please call our office to schedule the follow-up appointment.  If you need a refill on your cardiac medications before your next appointment, please call your pharmacy.    Signed, Sanda Klein, MD  11/29/2017 4:53 PM    [facility] [address], [place], [place]  [address] Phone: [phone]; Fax: [phone]

## 2017-11-28 NOTE — Patient Instructions (Signed)
Dr Sallyanne Kuster recommends that you continue on your current medications as directed. Please refer to the Current Medication list given to you today.  Remote monitoring is used to monitor your Pacemaker or ICD from home. This monitoring reduces the number of office visits required to check your device to one time per year. It allows [place] to keep an eye on the functioning of your device to ensure it is working properly. You are scheduled for a device check from home on Tuesday, April 30th, 2019. You may send your transmission at any time that day. If you have a wireless device, the transmission will be sent automatically. After your physician reviews your transmission, you will receive a notification with your next transmission date.  Dr Sallyanne Kuster recommends that you schedule a follow-up appointment in 5 months with an ICD check. You will receive a reminder letter in the mail two months in advance. If you don't receive a letter, please call our office to schedule the follow-up appointment.  If you need a refill on your cardiac medications before your next appointment, please call your pharmacy.

## 2017-11-30 DIAGNOSIS — I252 Old myocardial infarction: Secondary | ICD-10-CM | POA: Diagnosis not present

## 2017-12-03 DIAGNOSIS — I252 Old myocardial infarction: Secondary | ICD-10-CM | POA: Diagnosis not present

## 2017-12-05 DIAGNOSIS — I252 Old myocardial infarction: Secondary | ICD-10-CM | POA: Diagnosis not present

## 2017-12-07 DIAGNOSIS — I252 Old myocardial infarction: Secondary | ICD-10-CM | POA: Diagnosis not present

## 2017-12-10 ENCOUNTER — Ambulatory Visit (INDEPENDENT_AMBULATORY_CARE_PROVIDER_SITE_OTHER): Payer: Medicare Other | Admitting: *Deleted

## 2017-12-10 DIAGNOSIS — Z5181 Encounter for therapeutic drug level monitoring: Secondary | ICD-10-CM | POA: Diagnosis not present

## 2017-12-10 DIAGNOSIS — I483 Typical atrial flutter: Secondary | ICD-10-CM | POA: Diagnosis not present

## 2017-12-10 DIAGNOSIS — Z953 Presence of xenogenic heart valve: Secondary | ICD-10-CM | POA: Diagnosis not present

## 2017-12-10 DIAGNOSIS — I252 Old myocardial infarction: Secondary | ICD-10-CM | POA: Diagnosis not present

## 2017-12-10 DIAGNOSIS — I4892 Unspecified atrial flutter: Secondary | ICD-10-CM | POA: Diagnosis not present

## 2017-12-10 LAB — POCT INR: INR: 3.3

## 2017-12-10 NOTE — Patient Instructions (Signed)
Take coumadin 1/2 tablet tonight then decrease dose to 1 1/2 tablets daily except 1 tablet on Mondays May have 1 Ensure every other day Recheck in 3 weeks

## 2017-12-12 DIAGNOSIS — I252 Old myocardial infarction: Secondary | ICD-10-CM | POA: Diagnosis not present

## 2017-12-12 LAB — CUP PACEART INCLINIC DEVICE CHECK
Date Time Interrogation Session: 20190313091107
Implantable Lead Implant Date: 20171024
Implantable Lead Implant Date: 20171024
Implantable Lead Location: 753858
Implantable Lead Location: 753859
Implantable Lead Model: 5076
Lead Channel Setting Pacing Amplitude: 2 V
Lead Channel Setting Pacing Amplitude: 2.25 V
MDC IDC LEAD IMPLANT DT: 20171024
MDC IDC LEAD LOCATION: 753860
MDC IDC PG IMPLANT DT: 20171024
MDC IDC SET LEADCHNL LV PACING PULSEWIDTH: 1 ms
MDC IDC SET LEADCHNL RA PACING AMPLITUDE: 1.75 V
MDC IDC SET LEADCHNL RV PACING PULSEWIDTH: 0.4 ms
MDC IDC SET LEADCHNL RV SENSING SENSITIVITY: 0.3 mV

## 2017-12-14 DIAGNOSIS — I252 Old myocardial infarction: Secondary | ICD-10-CM | POA: Diagnosis not present

## 2017-12-17 ENCOUNTER — Encounter (HOSPITAL_COMMUNITY): Payer: Self-pay | Admitting: Cardiology

## 2017-12-17 ENCOUNTER — Telehealth: Payer: Self-pay | Admitting: *Deleted

## 2017-12-17 NOTE — Telephone Encounter (Signed)
Pt has the flu, wants to know what is safe for him to take since he's on coumadin.

## 2017-12-17 NOTE — Telephone Encounter (Signed)
Spoke with wife.  She had already confirmed with pharmacist that it's OK to take coricidin.  Told wife that pt can also take plain Mucinex if needed for cough/congestion.  She verbalized understanding.

## 2017-12-22 ENCOUNTER — Other Ambulatory Visit (HOSPITAL_COMMUNITY): Payer: Self-pay | Admitting: Adult Health

## 2017-12-24 DIAGNOSIS — I252 Old myocardial infarction: Secondary | ICD-10-CM | POA: Diagnosis not present

## 2017-12-26 DIAGNOSIS — I252 Old myocardial infarction: Secondary | ICD-10-CM | POA: Diagnosis not present

## 2017-12-31 ENCOUNTER — Ambulatory Visit (INDEPENDENT_AMBULATORY_CARE_PROVIDER_SITE_OTHER): Payer: Medicare Other | Admitting: *Deleted

## 2017-12-31 DIAGNOSIS — I4892 Unspecified atrial flutter: Secondary | ICD-10-CM

## 2017-12-31 DIAGNOSIS — Z5181 Encounter for therapeutic drug level monitoring: Secondary | ICD-10-CM

## 2017-12-31 DIAGNOSIS — Z953 Presence of xenogenic heart valve: Secondary | ICD-10-CM | POA: Diagnosis not present

## 2017-12-31 DIAGNOSIS — I483 Typical atrial flutter: Secondary | ICD-10-CM | POA: Diagnosis not present

## 2017-12-31 DIAGNOSIS — I252 Old myocardial infarction: Secondary | ICD-10-CM | POA: Diagnosis not present

## 2017-12-31 LAB — POCT INR: INR: 3

## 2017-12-31 NOTE — Patient Instructions (Signed)
Continue coumadin 1 1/2 tablets daily except 1 tablet on Mondays  Recheck in 4 weeks 

## 2018-01-02 DIAGNOSIS — E1165 Type 2 diabetes mellitus with hyperglycemia: Secondary | ICD-10-CM | POA: Diagnosis not present

## 2018-01-02 DIAGNOSIS — E119 Type 2 diabetes mellitus without complications: Secondary | ICD-10-CM | POA: Diagnosis not present

## 2018-01-02 DIAGNOSIS — H1789 Other corneal scars and opacities: Secondary | ICD-10-CM | POA: Diagnosis not present

## 2018-01-02 DIAGNOSIS — H2513 Age-related nuclear cataract, bilateral: Secondary | ICD-10-CM | POA: Diagnosis not present

## 2018-01-02 DIAGNOSIS — I252 Old myocardial infarction: Secondary | ICD-10-CM | POA: Diagnosis not present

## 2018-01-02 DIAGNOSIS — H524 Presbyopia: Secondary | ICD-10-CM | POA: Diagnosis not present

## 2018-01-07 DIAGNOSIS — I252 Old myocardial infarction: Secondary | ICD-10-CM | POA: Diagnosis not present

## 2018-01-09 DIAGNOSIS — I252 Old myocardial infarction: Secondary | ICD-10-CM | POA: Diagnosis not present

## 2018-01-28 ENCOUNTER — Ambulatory Visit (INDEPENDENT_AMBULATORY_CARE_PROVIDER_SITE_OTHER): Payer: Medicare Other | Admitting: *Deleted

## 2018-01-28 DIAGNOSIS — Z5181 Encounter for therapeutic drug level monitoring: Secondary | ICD-10-CM

## 2018-01-28 DIAGNOSIS — I4892 Unspecified atrial flutter: Secondary | ICD-10-CM | POA: Diagnosis not present

## 2018-01-28 DIAGNOSIS — I483 Typical atrial flutter: Secondary | ICD-10-CM

## 2018-01-28 DIAGNOSIS — Z953 Presence of xenogenic heart valve: Secondary | ICD-10-CM | POA: Diagnosis not present

## 2018-01-28 LAB — POCT INR: INR: 3

## 2018-01-28 NOTE — Patient Instructions (Signed)
Continue coumadin 1 1/2 tablets daily except 1 tablet on Mondays  Recheck in 4 weeks 

## 2018-01-29 ENCOUNTER — Ambulatory Visit (INDEPENDENT_AMBULATORY_CARE_PROVIDER_SITE_OTHER): Payer: Medicare Other | Admitting: *Deleted

## 2018-01-29 DIAGNOSIS — I255 Ischemic cardiomyopathy: Secondary | ICD-10-CM

## 2018-01-29 NOTE — Progress Notes (Signed)
Remote ICD transmission.   

## 2018-01-30 ENCOUNTER — Encounter: Payer: Self-pay | Admitting: Cardiology

## 2018-01-30 LAB — CUP PACEART REMOTE DEVICE CHECK
Brady Statistic AP VP Percent: 40.25 %
Brady Statistic AP VS Percent: 1.14 %
Brady Statistic AS VP Percent: 57.01 %
Brady Statistic RA Percent Paced: 39.41 %
HIGH POWER IMPEDANCE MEASURED VALUE: 57 Ohm
Implantable Lead Implant Date: 20171024
Implantable Lead Location: 753859
Implantable Lead Location: 753860
Implantable Lead Model: 5076
Implantable Pulse Generator Implant Date: 20171024
Lead Channel Impedance Value: 218.087
Lead Channel Impedance Value: 218.087
Lead Channel Impedance Value: 218.087
Lead Channel Impedance Value: 228 Ohm
Lead Channel Impedance Value: 361 Ohm
Lead Channel Impedance Value: 456 Ohm
Lead Channel Impedance Value: 456 Ohm
Lead Channel Impedance Value: 456 Ohm
Lead Channel Impedance Value: 532 Ohm
Lead Channel Impedance Value: 722 Ohm
Lead Channel Impedance Value: 760 Ohm
Lead Channel Pacing Threshold Amplitude: 0.5 V
Lead Channel Pacing Threshold Amplitude: 0.75 V
Lead Channel Pacing Threshold Pulse Width: 1 ms
Lead Channel Sensing Intrinsic Amplitude: 1.25 mV
Lead Channel Sensing Intrinsic Amplitude: 12.375 mV
Lead Channel Sensing Intrinsic Amplitude: 12.375 mV
Lead Channel Setting Pacing Amplitude: 1.75 V
Lead Channel Setting Pacing Amplitude: 2 V
Lead Channel Setting Pacing Pulse Width: 0.4 ms
Lead Channel Setting Pacing Pulse Width: 1 ms
MDC IDC LEAD IMPLANT DT: 20171024
MDC IDC LEAD IMPLANT DT: 20171024
MDC IDC LEAD LOCATION: 753858
MDC IDC MSMT BATTERY REMAINING LONGEVITY: 83 mo
MDC IDC MSMT BATTERY VOLTAGE: 2.98 V
MDC IDC MSMT LEADCHNL LV IMPEDANCE VALUE: 228 Ohm
MDC IDC MSMT LEADCHNL LV IMPEDANCE VALUE: 418 Ohm
MDC IDC MSMT LEADCHNL LV IMPEDANCE VALUE: 456 Ohm
MDC IDC MSMT LEADCHNL LV IMPEDANCE VALUE: 760 Ohm
MDC IDC MSMT LEADCHNL LV IMPEDANCE VALUE: 779 Ohm
MDC IDC MSMT LEADCHNL LV IMPEDANCE VALUE: 779 Ohm
MDC IDC MSMT LEADCHNL RA IMPEDANCE VALUE: 399 Ohm
MDC IDC MSMT LEADCHNL RA PACING THRESHOLD AMPLITUDE: 0.875 V
MDC IDC MSMT LEADCHNL RA PACING THRESHOLD PULSEWIDTH: 0.4 ms
MDC IDC MSMT LEADCHNL RA SENSING INTR AMPL: 1.25 mV
MDC IDC MSMT LEADCHNL RV PACING THRESHOLD PULSEWIDTH: 0.4 ms
MDC IDC SESS DTM: 20190430123324
MDC IDC SET LEADCHNL RA PACING AMPLITUDE: 1.75 V
MDC IDC SET LEADCHNL RV SENSING SENSITIVITY: 0.3 mV
MDC IDC STAT BRADY AS VS PERCENT: 1.61 %
MDC IDC STAT BRADY RV PERCENT PACED: 9.32 %

## 2018-02-02 ENCOUNTER — Other Ambulatory Visit (HOSPITAL_COMMUNITY): Payer: Self-pay | Admitting: Adult Health

## 2018-02-19 ENCOUNTER — Other Ambulatory Visit: Payer: Self-pay

## 2018-02-19 ENCOUNTER — Ambulatory Visit (HOSPITAL_COMMUNITY)
Admission: RE | Admit: 2018-02-19 | Discharge: 2018-02-19 | Disposition: A | Discharge status: Home / Self Care | Payer: Medicare Other | Source: Ambulatory Visit | Attending: Cardiology | Admitting: Cardiology

## 2018-02-19 ENCOUNTER — Encounter: Payer: Self-pay | Admitting: Cardiovascular Disease

## 2018-02-19 ENCOUNTER — Encounter (HOSPITAL_COMMUNITY): Payer: Self-pay | Admitting: Cardiology

## 2018-02-19 VITALS — BP 125/74 | HR 78 | Wt 185.5 lb

## 2018-02-19 DIAGNOSIS — I252 Old myocardial infarction: Secondary | ICD-10-CM | POA: Diagnosis not present

## 2018-02-19 DIAGNOSIS — I5022 Chronic systolic (congestive) heart failure: Secondary | ICD-10-CM | POA: Insufficient documentation

## 2018-02-19 DIAGNOSIS — Z7982 Long term (current) use of aspirin: Secondary | ICD-10-CM | POA: Diagnosis not present

## 2018-02-19 DIAGNOSIS — I4892 Unspecified atrial flutter: Secondary | ICD-10-CM | POA: Diagnosis not present

## 2018-02-19 DIAGNOSIS — I442 Atrioventricular block, complete: Secondary | ICD-10-CM | POA: Insufficient documentation

## 2018-02-19 DIAGNOSIS — Z79890 Hormone replacement therapy (postmenopausal): Secondary | ICD-10-CM | POA: Diagnosis not present

## 2018-02-19 DIAGNOSIS — Z7984 Long term (current) use of oral hypoglycemic drugs: Secondary | ICD-10-CM | POA: Insufficient documentation

## 2018-02-19 DIAGNOSIS — E785 Hyperlipidemia, unspecified: Secondary | ICD-10-CM | POA: Diagnosis not present

## 2018-02-19 DIAGNOSIS — I255 Ischemic cardiomyopathy: Secondary | ICD-10-CM | POA: Diagnosis not present

## 2018-02-19 DIAGNOSIS — Z79899 Other long term (current) drug therapy: Secondary | ICD-10-CM | POA: Insufficient documentation

## 2018-02-19 DIAGNOSIS — Z95810 Presence of automatic (implantable) cardiac defibrillator: Secondary | ICD-10-CM | POA: Insufficient documentation

## 2018-02-19 DIAGNOSIS — Z8249 Family history of ischemic heart disease and other diseases of the circulatory system: Secondary | ICD-10-CM | POA: Insufficient documentation

## 2018-02-19 DIAGNOSIS — Z7901 Long term (current) use of anticoagulants: Secondary | ICD-10-CM | POA: Diagnosis not present

## 2018-02-19 DIAGNOSIS — E782 Mixed hyperlipidemia: Secondary | ICD-10-CM

## 2018-02-19 DIAGNOSIS — N183 Chronic kidney disease, stage 3 (moderate): Secondary | ICD-10-CM | POA: Insufficient documentation

## 2018-02-19 DIAGNOSIS — I5042 Chronic combined systolic (congestive) and diastolic (congestive) heart failure: Secondary | ICD-10-CM | POA: Diagnosis not present

## 2018-02-19 DIAGNOSIS — Z953 Presence of xenogenic heart valve: Secondary | ICD-10-CM | POA: Diagnosis not present

## 2018-02-19 DIAGNOSIS — Z951 Presence of aortocoronary bypass graft: Secondary | ICD-10-CM | POA: Insufficient documentation

## 2018-02-19 DIAGNOSIS — E039 Hypothyroidism, unspecified: Secondary | ICD-10-CM | POA: Diagnosis not present

## 2018-02-19 DIAGNOSIS — E1122 Type 2 diabetes mellitus with diabetic chronic kidney disease: Secondary | ICD-10-CM | POA: Insufficient documentation

## 2018-02-19 DIAGNOSIS — I25810 Atherosclerosis of coronary artery bypass graft(s) without angina pectoris: Secondary | ICD-10-CM | POA: Insufficient documentation

## 2018-02-19 LAB — BASIC METABOLIC PANEL
Anion gap: 7 (ref 5–15)
BUN: 28 mg/dL — AB (ref 6–20)
CO2: 22 mmol/L (ref 22–32)
CREATININE: 1.7 mg/dL — AB (ref 0.61–1.24)
Calcium: 8.9 mg/dL (ref 8.9–10.3)
Chloride: 107 mmol/L (ref 101–111)
GFR calc Af Amer: 45 mL/min — ABNORMAL LOW (ref >60)
GFR calc non Af Amer: 39 mL/min — ABNORMAL LOW (ref >60)
GLUCOSE: 149 mg/dL — AB (ref 65–99)
Potassium: 4.8 mmol/L (ref 3.5–5.1)
Sodium: 136 mmol/L (ref 135–145)

## 2018-02-19 LAB — LIPID PANEL
CHOL/HDL RATIO: 4.1 ratio
CHOLESTEROL: 122 mg/dL (ref 0–200)
HDL: 30 mg/dL — ABNORMAL LOW (ref >40)
LDL CALC: 60 mg/dL (ref 0–99)
Triglycerides: 159 mg/dL — ABNORMAL HIGH (ref <150)
VLDL: 32 mg/dL (ref 0–40)

## 2018-02-19 LAB — DIGOXIN LEVEL: Digoxin Level: 0.2 ng/mL — ABNORMAL LOW (ref 0.8–2.0)

## 2018-02-19 MED ORDER — CARVEDILOL 12.5 MG PO TABS: 12.5000 mg | ORAL_TABLET | Freq: Two times a day (BID) | ORAL | 3 refills | Status: DC

## 2018-02-19 MED ORDER — DIGOXIN 125 MCG PO TABS: 0.0625 mg | ORAL_TABLET | Freq: Every day | ORAL | 11 refills | Status: DC

## 2018-02-19 MED ORDER — SPIRONOLACTONE 25 MG PO TABS: 25.0000 mg | ORAL_TABLET | Freq: Every day | ORAL | 6 refills | Status: DC

## 2018-02-19 MED ORDER — WARFARIN SODIUM 5 MG PO TABS: 5.0000 mg | ORAL_TABLET | Freq: Every day | ORAL | 0 refills | Status: DC

## 2018-02-19 NOTE — Patient Instructions (Signed)
Increase Spironolactone 25 mg (1 tab) every night  Labs drawn today (if we do not call you, then your lab work was stable)   Your physician recommends that you return for lab work in: 10 days Rx given  Your physician recommends that you schedule a follow-up appointment in: 4 months with Dr. Aundra Dubin  Please Call an Schedule Appointment (July., 2019)

## 2018-02-19 NOTE — Progress Notes (Signed)
PCP: Dr Hilma Favors Cardiology: Dr. Sallyanne Kuster HF Cardiology: Dr. Aundra Dubin  72 y.o. with history of CAD s/p CABG in 2007 then redo CABG in 10/17 with mitral valve replacement presents for cardiology followup.  He was admitted in 10/17 for redo CABG with SVG-PDA and SVG-ramus.  He also had mitral valve replacement with a bioprosthetic valve because of infarct-related mitral regurgitation.  His EF on last study (10/17 TEE) was 20-25%.   Post-operative course was complicated by CHF requiring diuresis.  He also had atrial flutter and required DCCV.  Due to complete heart block, he later got a CRT-D system.    At a prior visit, he was in atrial flutter.  He saw Dr. Curt Bears, it was decided to arrange for DCCV with ablation down the road when PPM leads have been in longer.  He had successful DCCV in 12/17 and is in NSR today.   He was admitted in 3/18 with NSTEMI and chest pain. TnI only 0.5.  LHC showed occlusion of SVG-PDA from CABG#1 to be the likely culprit.  However, SVG-PDA from CABG#2 was patent.  No intervention.  Echo in 3/18 from [place] showed EF 30-35%, stable bioprosthetic mitral valve.   He had atrial flutter ablation in 8/18.  He is in NSR today and is off amiodarone.    In 11/18, he had an inguinal hernia repair. Post-op, he had an NSTEMI.  LHC showed occlusion of a PLV branch that had been backfilled by SVG-PDA (prior cath had shown severe diffuse disease in the PLV).  Echo in 11/18 showed EF 20-25% with normal bioprosthetic mitral valve.    Patient returns for followup of CHF today.  He continues to do cardiac rehab in [place].  He has been doing quite well recently.  No significant exertional dyspnea at cardiac rehab.  Thinking about taking a fishing trip.  Generally not lightheaded but had 1 episode about 3 weeks ago where he almost fall after standing up. No orthopnea/PND.  He has been having only very rare chest pain which is an improvement.    Optivol (personally reviewed): Rare atrial  fibrillation, fluid index < threshold, impedance stable.  >99% BiV pacing, no VT.   ECG (personally reviewed): NSR, BiV paced.   Labs (11/17): K 4.8, creatinine 1.89, hgb 12.3, digoxin 0.9, LFTs normal, TSH 8.235 (mild increase), free T3 low, free T4 normal, LFTs normal.  Labs (12/17): K 4.5, creatinine 1.7, BNP 3909 Labs (3/18): K 3.9, creatinine 1.48, hgb 11.3 Labs (4/18): digoxin 0.5, LFTs normal Labs (7/18): K 5.1, creatinine 1.78, LDL 63, HDL 29, TSH elevated Labs (11/18): K 4.5, creatinine 1.86, hgb 13.6 Labs (12/18): K 5, creatinine 1.88 Labs (1/19): digoxin 0.3, K 4.7, creatinine 1.83  PMH: 1. CAD: CABG 2007.  - LHC (9/17) with patent LIMA-LAD, totally occluded SVG-D, severe stenosis in SVG-PDA.  - Redo CABG 10/17 with SVG-PDA, SVG-ramus and mitral valve replacement.  - NSTEMI 3/18.  LHC with culprit lesion likely occlusion of SVG-PDA from CABG#1.  Patent SVG-PDA from CABG#2.  No intervention.  - NSTEMI 11/18 post-op inguinal hernia repair.  LHC with occlusion of a PLV branch that had been backfilled by SVG-PDA (prior cath had shown severe diffuse disease in the PLV).  2. Mitral regurgitation: Ischemic MR, mitral valve replacement was done in 10/17 (bioprosthetic). 3. Complete heart block: Post-op in 10/17.  Medtronic CRT-D placed.  4. Atrial flutter: DCCV 10/17 and again in 12/17.  - Ablation 8/18, now off amiodarone.  5. Type II diabetes 6.  Hyperlipidemia 7. Chronic systolic CHF: Ischemic cardiomyopathy.   - TEE (10/17): EF 20-25%, severe LV dilation - Echo (3/18, [place]): EF 30-35%, bioprosthetic mitral valve with mean gradient 5 mmHg.  - Echo (11/18): EF 20-25%, moderate LV dilation, normal bioprosthetic mitral valve.  8. Hypothyroidism  SH: Married, retired, lives in [place], nonsmoker, no ETOH.   Family History  Problem Relation Age of Onset  . Heart attack Mother   . Heart disease Mother   . Heart attack Father   . Heart disease Father   . Hypertension Father    . Hypertension Sister   . Heart disease Maternal Grandmother    ROS: All systems reviewed and negative except as per HPI.   Current Outpatient Medications  Medication Sig Dispense Refill  . ACCU-CHEK SOFTCLIX LANCETS lancets Use as instructed. E11.65 100 each 2  . amoxicillin (AMOXIL) 500 MG tablet Take 4 tablets (2,000 mg total) by mouth as needed. Pre-dental work only. 4 tablet 2  . aspirin EC 81 MG tablet Take 81 mg by mouth daily.     . carvedilol (COREG) 12.5 MG tablet Take 1 tablet (12.5 mg total) by mouth 2 (two) times daily with a meal. 60 tablet 3  . digoxin (LANOXIN) 0.125 MG tablet Take 0.5 tablets (0.0625 mg total) by mouth daily. 15 tablet 11  . furosemide (LASIX) 20 MG tablet Take 1 tablet (20 mg total) by mouth every other day. 30 tablet 11  . levothyroxine (SYNTHROID, LEVOTHROID) 88 MCG tablet Take 1 tablet (88 mcg total) by mouth daily before breakfast. 90 tablet 2  . linagliptin (TRADJENTA) 5 MG TABS tablet Take 1 tablet (5 mg total) by mouth daily. 90 tablet 2  . neomycin-bacitracin-polymyxin (NEOSPORIN) ointment Apply 1 application topically daily as needed for wound care.    . nitroGLYCERIN (NITROSTAT) 0.4 MG SL tablet DISSOLVE ONE TABLET UNDER THE TONGUE EVERY 5 MINUTES AS NEEDED FOR CHEST PAIN.  DO NOT EXCEED A TOTAL OF 3 DOSES IN 15 MINUTES 25 tablet 15  . polyethylene glycol (MIRALAX / GLYCOLAX) packet Take 17 g by mouth daily as needed for mild constipation or moderate constipation.    . rosuvastatin (CRESTOR) 10 MG tablet Take 10 mgs by mouth once daily in the evening 90 tablet 3  . sacubitril-valsartan (ENTRESTO) 49-51 MG Take 1 tablet by mouth 2 (two) times daily. 180 tablet 3  . spironolactone (ALDACTONE) 25 MG tablet Take 1 tablet (25 mg total) by mouth at bedtime. 30 tablet 6  . warfarin (COUMADIN) 5 MG tablet Take 1 tablet (5 mg total) by mouth daily. Take 1 1/2 tablets daily except 1 tablet on Sundays and Thursdays 40 tablet 0   No current  facility-administered medications for this encounter.    BP 125/74   Pulse 78   Wt 185 lb 8 oz (84.1 kg)   SpO2 100%   BMI 25.16 kg/m  General: NAD Neck: No JVD, no thyromegaly or thyroid nodule.  Lungs: Clear to auscultation bilaterally with normal respiratory effort. CV: Nondisplaced PMI.  Heart regular S1/S2, no S3/S4, no murmur.  No peripheral edema.  No carotid bruit.  Normal pedal pulses.  Abdomen: Soft, nontender, no hepatosplenomegaly, no distention.  Skin: Intact without lesions or rashes.  Neurologic: Alert and oriented x 3.  Psych: Normal affect. Extremities: No clubbing or cyanosis.  HEENT: Normal.   Assessment/Plan: 1. Chronic systolic CHF: Ischemic cardiomyopathy.  TEE 10/17 with EF 20-25%.  Has Medtronic CRT-D system. Most recent echo from 11/18 with EF 20-25%.  On exam and by Optivol, he is not volume overloaded.  NYHA class II. - Continue Lasix 20 mg every other day, BMET today.   - Continue Entresto 49/51 bid.   - Continue digoxin.  Check digoxin level today. - Increase spironolactone to 25 mg daily.  BMET in 10 days. - Continue Coreg 12.5 mg bid.   2. CAD: s/p redo CABG.  Admission in 3/18 with NSTEMI, LHC showed occlusion of SVG-PDA from CABG#1 but patent SVG-PDA from CABG#2, no intervention.  He had a post-op NSTEMI after inguinal hernia repair in 11/18.  LHC showed occlusion of a PLV branch that had been backfilled by SVG-PDA (prior cath had shown severe diffuse disease in the PLV). Very rare chest pain now.   - Continue statin => Crestor 10 mg daily. Check lipids today.   - Continue cardiac rehab maintenance.   - Continue ASA 81. - Avoid future elective surgery.  3. Bioprosthetic mitral valve: Stable on 11/18 echo. 4. Atrial flutter: s/p ablation in 8/18.  He is in NSR and off amiodarone.  - Continue warfarin.  5. CKD: Stage III.  BMET today.    Followup in 4 months, will see Dr Sallyanne Kuster in July.    Loralie Champagne 02/19/2018

## 2018-02-20 ENCOUNTER — Encounter (HOSPITAL_COMMUNITY): Payer: Self-pay

## 2018-02-27 ENCOUNTER — Ambulatory Visit (INDEPENDENT_AMBULATORY_CARE_PROVIDER_SITE_OTHER): Payer: Medicare Other | Admitting: *Deleted

## 2018-02-27 DIAGNOSIS — I4892 Unspecified atrial flutter: Secondary | ICD-10-CM | POA: Diagnosis not present

## 2018-02-27 DIAGNOSIS — Z953 Presence of xenogenic heart valve: Secondary | ICD-10-CM

## 2018-02-27 DIAGNOSIS — I483 Typical atrial flutter: Secondary | ICD-10-CM

## 2018-02-27 DIAGNOSIS — Z5181 Encounter for therapeutic drug level monitoring: Secondary | ICD-10-CM

## 2018-02-27 LAB — POCT INR: INR: 2.2 (ref 2.0–3.0)

## 2018-02-27 NOTE — Patient Instructions (Signed)
Increase coumadin to 1 1/2 tablets daily  Pt wants to increase Ensure from every other day to daily Recheck in 4 weeks

## 2018-03-02 ENCOUNTER — Other Ambulatory Visit (HOSPITAL_COMMUNITY): Payer: Self-pay | Admitting: Cardiology

## 2018-03-04 ENCOUNTER — Other Ambulatory Visit: Payer: Self-pay | Admitting: "Endocrinology

## 2018-03-04 ENCOUNTER — Other Ambulatory Visit: Payer: Self-pay | Admitting: Cardiology

## 2018-03-04 DIAGNOSIS — Z79899 Other long term (current) drug therapy: Secondary | ICD-10-CM | POA: Diagnosis not present

## 2018-03-04 DIAGNOSIS — I5022 Chronic systolic (congestive) heart failure: Secondary | ICD-10-CM | POA: Diagnosis not present

## 2018-03-04 DIAGNOSIS — E1159 Type 2 diabetes mellitus with other circulatory complications: Secondary | ICD-10-CM | POA: Diagnosis not present

## 2018-03-04 DIAGNOSIS — E785 Hyperlipidemia, unspecified: Secondary | ICD-10-CM | POA: Diagnosis not present

## 2018-03-04 DIAGNOSIS — E039 Hypothyroidism, unspecified: Secondary | ICD-10-CM | POA: Diagnosis not present

## 2018-03-05 ENCOUNTER — Other Ambulatory Visit: Payer: Self-pay | Admitting: *Deleted

## 2018-03-05 LAB — BASIC METABOLIC PANEL
BUN/Creatinine Ratio: 20 (ref 10–24)
BUN: 36 mg/dL — ABNORMAL HIGH (ref 8–27)
CALCIUM: 9.4 mg/dL (ref 8.6–10.2)
CO2: 23 mmol/L (ref 20–29)
CREATININE: 1.84 mg/dL — AB (ref 0.76–1.27)
Chloride: 101 mmol/L (ref 96–106)
GFR calc non Af Amer: 36 mL/min/{1.73_m2} — ABNORMAL LOW (ref >59)
GFR, EST AFRICAN AMERICAN: 42 mL/min/{1.73_m2} — AB (ref >59)
Glucose: 127 mg/dL — ABNORMAL HIGH (ref 65–99)
Potassium: 5.4 mmol/L — ABNORMAL HIGH (ref 3.5–5.2)
Sodium: 138 mmol/L (ref 134–144)

## 2018-03-05 LAB — LIPID PANEL
CHOL/HDL RATIO: 4.1 ratio (ref 0.0–5.0)
Cholesterol, Total: 130 mg/dL (ref 100–199)
HDL: 32 mg/dL — AB (ref >39)
LDL Calculated: 70 mg/dL (ref 0–99)
TRIGLYCERIDES: 139 mg/dL (ref 0–149)
VLDL CHOLESTEROL CAL: 28 mg/dL (ref 5–40)

## 2018-03-05 LAB — SPECIMEN STATUS REPORT

## 2018-03-05 LAB — COMPREHENSIVE METABOLIC PANEL
A/G RATIO: 1.6 (ref 1.2–2.2)
ALT: 17 IU/L (ref 0–44)
AST: 16 IU/L (ref 0–40)
Albumin: 4.3 g/dL (ref 3.5–4.8)
Alkaline Phosphatase: 67 IU/L (ref 39–117)
BILIRUBIN TOTAL: 0.5 mg/dL (ref 0.0–1.2)
BUN/Creatinine Ratio: 19 (ref 10–24)
BUN: 37 mg/dL — ABNORMAL HIGH (ref 8–27)
CHLORIDE: 100 mmol/L (ref 96–106)
CO2: 23 mmol/L (ref 20–29)
Calcium: 9.3 mg/dL (ref 8.6–10.2)
Creatinine, Ser: 1.94 mg/dL — ABNORMAL HIGH (ref 0.76–1.27)
GFR calc non Af Amer: 34 mL/min/{1.73_m2} — ABNORMAL LOW (ref >59)
GFR, EST AFRICAN AMERICAN: 39 mL/min/{1.73_m2} — AB (ref >59)
Globulin, Total: 2.7 g/dL (ref 1.5–4.5)
Glucose: 126 mg/dL — ABNORMAL HIGH (ref 65–99)
Potassium: 5.3 mmol/L — ABNORMAL HIGH (ref 3.5–5.2)
Sodium: 137 mmol/L (ref 134–144)
TOTAL PROTEIN: 7 g/dL (ref 6.0–8.5)

## 2018-03-05 LAB — TSH: TSH: 2.24 u[IU]/mL (ref 0.450–4.500)

## 2018-03-05 LAB — HGB A1C W/O EAG: Hgb A1c MFr Bld: 6.2 % — ABNORMAL HIGH (ref 4.8–5.6)

## 2018-03-05 LAB — T4, FREE: Free T4: 1.79 ng/dL — ABNORMAL HIGH (ref 0.82–1.77)

## 2018-03-05 MED ORDER — DIGOXIN 125 MCG PO TABS: 0.0625 mg | ORAL_TABLET | Freq: Every day | ORAL | 1 refills | Status: DC

## 2018-03-05 MED ORDER — WARFARIN SODIUM 5 MG PO TABS: ORAL_TABLET | ORAL | 3 refills | Status: DC

## 2018-03-05 NOTE — Telephone Encounter (Signed)
Warfarin Rx sent to [facility].

## 2018-03-12 ENCOUNTER — Other Ambulatory Visit: Payer: Self-pay | Admitting: Cardiology

## 2018-03-12 DIAGNOSIS — I5022 Chronic systolic (congestive) heart failure: Secondary | ICD-10-CM | POA: Diagnosis not present

## 2018-03-13 LAB — BASIC METABOLIC PANEL
BUN / CREAT RATIO: 21 (ref 10–24)
BUN: 39 mg/dL — ABNORMAL HIGH (ref 8–27)
CHLORIDE: 97 mmol/L (ref 96–106)
CO2: 25 mmol/L (ref 20–29)
Calcium: 9.7 mg/dL (ref 8.6–10.2)
Creatinine, Ser: 1.88 mg/dL — ABNORMAL HIGH (ref 0.76–1.27)
GFR calc Af Amer: 41 mL/min/{1.73_m2} — ABNORMAL LOW (ref >59)
GFR calc non Af Amer: 35 mL/min/{1.73_m2} — ABNORMAL LOW (ref >59)
GLUCOSE: 102 mg/dL — AB (ref 65–99)
POTASSIUM: 5.1 mmol/L (ref 3.5–5.2)
SODIUM: 137 mmol/L (ref 134–144)

## 2018-03-13 LAB — SPECIMEN STATUS REPORT

## 2018-03-27 ENCOUNTER — Ambulatory Visit (INDEPENDENT_AMBULATORY_CARE_PROVIDER_SITE_OTHER): Payer: Medicare Other | Admitting: *Deleted

## 2018-03-27 DIAGNOSIS — I483 Typical atrial flutter: Secondary | ICD-10-CM

## 2018-03-27 DIAGNOSIS — Z953 Presence of xenogenic heart valve: Secondary | ICD-10-CM

## 2018-03-27 DIAGNOSIS — Z5181 Encounter for therapeutic drug level monitoring: Secondary | ICD-10-CM | POA: Diagnosis not present

## 2018-03-27 DIAGNOSIS — I4892 Unspecified atrial flutter: Secondary | ICD-10-CM

## 2018-03-27 LAB — POCT INR: INR: 3.3 — AB (ref 2.0–3.0)

## 2018-03-27 NOTE — Patient Instructions (Signed)
Decrease coumadin to 1 1/2 tablets daily except 1 tablet on Wednesdays Continue ensure daily Recheck in 4 weeks

## 2018-04-03 ENCOUNTER — Encounter: Payer: Self-pay | Admitting: "Endocrinology

## 2018-04-03 ENCOUNTER — Ambulatory Visit (INDEPENDENT_AMBULATORY_CARE_PROVIDER_SITE_OTHER): Payer: Medicare Other | Admitting: "Endocrinology

## 2018-04-03 VITALS — BP 109/73 | HR 69 | Ht 72.0 in | Wt 185.0 lb

## 2018-04-03 DIAGNOSIS — E1159 Type 2 diabetes mellitus with other circulatory complications: Secondary | ICD-10-CM | POA: Diagnosis not present

## 2018-04-03 DIAGNOSIS — N183 Chronic kidney disease, stage 3 unspecified: Secondary | ICD-10-CM

## 2018-04-03 DIAGNOSIS — I10 Essential (primary) hypertension: Secondary | ICD-10-CM

## 2018-04-03 DIAGNOSIS — I255 Ischemic cardiomyopathy: Secondary | ICD-10-CM | POA: Diagnosis not present

## 2018-04-03 DIAGNOSIS — E782 Mixed hyperlipidemia: Secondary | ICD-10-CM | POA: Diagnosis not present

## 2018-04-03 DIAGNOSIS — E039 Hypothyroidism, unspecified: Secondary | ICD-10-CM

## 2018-04-03 DIAGNOSIS — E1122 Type 2 diabetes mellitus with diabetic chronic kidney disease: Secondary | ICD-10-CM

## 2018-04-03 MED ORDER — LEVOTHYROXINE SODIUM 75 MCG PO TABS: 75.0000 ug | ORAL_TABLET | Freq: Every day | ORAL | 1 refills | Status: DC

## 2018-04-03 NOTE — Progress Notes (Signed)
Subjective:    Patient ID: Christian Bowman, male    DOB: 07-28-1946,    Past Medical History:  Diagnosis Date  . AICD (automatic cardioverter/defibrillator) present    medtronic-   DR. CROITORU , DR. Aundra Dubin   . Anginal pain ([place])    cp sat 08/11/17  . Anxiety   . CAD (coronary artery disease)   . CHF (congestive heart failure) ([place])   . Complication of anesthesia    took awhile to wake up   . Coronary artery disease involving coronary bypass graft   . Cyst of neck    right side  . DM2 (diabetes mellitus, type 2) ([place]) 08/26/2013  . Dyspnea   . Heart attack ([place])    "not sure when" (08/20/2017)  . HTN (hypertension) 08/26/2013  . Hyperlipidemia 08/26/2013  . Hypothyroidism   . Left main coronary artery disease   . Left renal artery stenosis ([place])    Genesis 6x12 stent 2007  . Obesity (BMI 30.0-34.9) 08/26/2013  . Postoperative atrial fibrillation ([place]) 10/15/2005  . Presence of permanent cardiac pacemaker   . S/P CABG x 4 10/13/2005   LIMA to LAD, SVG to intermediate branch, sequential SVG to PDA and RPL branch, EVH via right thigh  . S/P mitral valve replacement with bioprosthetic valve 07/11/2016   31 mm [facility] Mitral bovine bioprosthetic tissue valve  . S/P redo CABG x 2 07/11/2016   SVG to PDA and SVG to Intermediate Branch, EVH via left thigh   Past Surgical History:  Procedure Laterality Date  . A-FLUTTER ABLATION N/A 05/11/2017   Procedure: A-Flutter Ablation;  Surgeon: Constance Haw, MD;  Location: [place] CV LAB;  Service: Cardiovascular;  Laterality: N/A;  . CARDIAC CATHETERIZATION N/A 06/21/2016   Procedure: Right/Left Heart Cath and Coronary/Graft Angiography;  Surgeon: Sherren Mocha, MD;  Location: [place] CV LAB;  Service: Cardiovascular;  Laterality: N/A;  . CARDIAC VALVE REPLACEMENT    . CARDIOVERSION N/A 07/19/2016   Procedure: CARDIOVERSION;  Surgeon: Lelon Perla, MD;  Location: [facility] ENDOSCOPY;  Service: Cardiovascular;   Laterality: N/A;  . CARDIOVERSION N/A 09/08/2016   Procedure: CARDIOVERSION;  Surgeon: Fay Records, MD;  Location: [place];  Service: Cardiovascular;  Laterality: N/A;  . CORONARY ANGIOPLASTY     STENT 2016  [place]     DES in SVG to right coronary artery system  . CORONARY ARTERY BYPASS GRAFT  10/13/2005   LIMA to LAD, SVG to intermediate branch, sequential SVG to PDA and RPL  . CORONARY ARTERY BYPASS GRAFT N/A 07/11/2016   Procedure: REDO CORONARY ARTERY BYPASS GRAFTING (CABG) x two using left leg greater saphenous vein harvested endoscopically-SVG to PDA -SVG to RAMUS INTERMEDIATE;  Surgeon: Rexene Alberts, MD;  Location: [place];  Service: Open Heart Surgery;  Laterality: N/A;  . CORONARY ARTERY BYPASS GRAFT N/A 07/11/2016   Procedure: Re-exploration (CABG) for post op bleeding,;  Surgeon: Rexene Alberts, MD;  Location: [place];  Service: Open Heart Surgery;  Laterality: N/A;  . EP IMPLANTABLE DEVICE N/A 07/25/2016   Procedure: BiV ICD Insertion CRT-D;  Surgeon: Will Meredith Leeds, MD;  Location: [place] CV LAB;  Service: Cardiovascular;  Laterality: N/A;  . INGUINAL HERNIA REPAIR Left 08/20/2017  . INGUINAL HERNIA REPAIR Left 08/20/2017   Procedure: OPEN LEFT INGUINAL HERNIA REPAIR;  Surgeon: Alphonsa Overall, MD;  Location: [place];  Service: General;  Laterality: Left;  . LEFT  HEART CATH AND CORS/GRAFTS ANGIOGRAPHY N/A 12/18/2016   Procedure: Left Heart Cath and Cors/Grafts Angiography;  Surgeon: Sherren Mocha, MD;  Location: [place] CV LAB;  Service: Cardiovascular;  Laterality: N/A;  . LEFT HEART CATH AND CORS/GRAFTS ANGIOGRAPHY N/A 08/22/2017   Procedure: LEFT HEART CATH AND CORS/GRAFTS ANGIOGRAPHY;  Surgeon: Larey Dresser, MD;  Location: [place] CV LAB;  Service: Cardiovascular;  Laterality: N/A;  . MITRAL VALVE REPLACEMENT N/A 07/11/2016   Procedure: MITRAL VALVE (MV) REPLACEMENT;  Surgeon: Rexene Alberts, MD;   Location: [place];  Service: Open Heart Surgery;  Laterality: N/A;  . MYOCARDICAL PERFUSION  10/09/2007   NORMAL PERFUSION IN ALL REGIONS;NO EVIDENCE OF INDUCIBLE ISCHEMIA;POST STRESS EF% 66  . RENAL ARTERY STENT Right 2007  . RENAL DOPPLER  03/28/2010   RIGHT RA-NORMAL;LEFT PROXIMAL RA AT STENT-PATENT WITH NO EVIDENCE OF SIGN DIAMETER REDUCTION. R & L KIDNEYS: EQUAL IN SIZE,SYMMETRICAL IN SHAPE.  . TEE WITHOUT CARDIOVERSION N/A 06/15/2016   Procedure: TRANSESOPHAGEAL ECHOCARDIOGRAM (TEE);  Surgeon: Sanda Klein, MD;  Location: [facility] ENDOSCOPY;  Service: Cardiovascular;  Laterality: N/A;  . TEE WITHOUT CARDIOVERSION N/A 07/11/2016   Procedure: TRANSESOPHAGEAL ECHOCARDIOGRAM (TEE);  Surgeon: Rexene Alberts, MD;  Location: [place];  Service: Open Heart Surgery;  Laterality: N/A;  . TEE WITHOUT CARDIOVERSION N/A 07/19/2016   Procedure: TRANSESOPHAGEAL ECHOCARDIOGRAM (TEE);  Surgeon: Lelon Perla, MD;  Location[facility]COPY;  Service: Cardiovascular;  Laterality: N/A;  . TRANSESOPHAGEAL ECHOCARDIOGRAM  10/19/2005   NORMAL LV; MILD TO MODERATE AMOUNT OF SOFT ATHEROMATOUS PLAQUE OF THE THORACIC AORTA; THE LEFT ATRIUM IS MILDLY DILATED;LEFT ATRIAL APPENDAGE FUNCTION IS NORMAL;NO THROMBUS IDENTIFIED. SMALL PFO WITH RIGHT TO LEFT SHUNT   Social History   Socioeconomic History  . Marital status: Married    Spouse name: Not on file  . Number of children: Not on file  . Years of education: Not on file  . Highest education level: Not on file  Occupational History  . Not on file  Social Needs  . Financial resource strain: Not on file  . Food insecurity:    Worry: Not on file    Inability: Not on file  . Transportation needs:    Medical: Not on file    Non-medical: Not on file  Tobacco Use  . Smoking status: Never Smoker  . Smokeless tobacco: Never Used  Substance and Sexual Activity  . Alcohol use: No  . Drug use: No  . Sexual activity: Never  Lifestyle  . Physical activity:    Days per week:  Not on file    Minutes per session: Not on file  . Stress: Not on file  Relationships  . Social connections:    Talks on phone: Not on file    Gets together: Not on file    Attends religious service: Not on file    Active member of club or organization: Not on file    Attends meetings of clubs or organizations: Not on file    Relationship status: Not on file  Other Topics Concern  . Not on file  Social History Narrative  . Not on file   Outpatient Encounter Medications as of 04/03/2018  Medication Sig  . ACCU-CHEK SOFTCLIX LANCETS lancets Use as instructed. E11.65  . amoxicillin (AMOXIL) 500 MG tablet Take 4 tablets (2,000 mg total) by mouth as needed. Pre-dental work only.  [name] aspirin EC 81 MG tablet Take 81 mg by mouth daily.   . carvedilol (COREG) 12.5 MG tablet  Take 1 tablet (12.5 mg total) by mouth 2 (two) times daily with a meal.  . digoxin (LANOXIN) 0.125 MG tablet Take 0.5 tablets (0.0625 mg total) by mouth daily.  . furosemide (LASIX) 20 MG tablet Take 1 tablet (20 mg total) by mouth every other day.  . levothyroxine (SYNTHROID, LEVOTHROID) 75 MCG tablet Take 1 tablet (75 mcg total) by mouth daily before breakfast.  . linagliptin (TRADJENTA) 5 MG TABS tablet Take 1 tablet (5 mg total) by mouth daily.  [name] neomycin-bacitracin-polymyxin (NEOSPORIN) ointment Apply 1 application topically daily as needed for wound care.  . nitroGLYCERIN (NITROSTAT) 0.4 MG SL tablet DISSOLVE ONE TABLET UNDER THE TONGUE EVERY 5 MINUTES AS NEEDED FOR CHEST PAIN.  DO NOT EXCEED A TOTAL OF 3 DOSES IN 15 MINUTES  . polyethylene glycol (MIRALAX / GLYCOLAX) packet Take 17 g by mouth daily as needed for mild constipation or moderate constipation.  . rosuvastatin (CRESTOR) 10 MG tablet Take 10 mgs by mouth once daily in the evening  . sacubitril-valsartan (ENTRESTO) 49-51 MG Take 1 tablet by mouth 2 (two) times daily.  [name] spironolactone (ALDACTONE) 25 MG tablet Take 1 tablet (25 mg total) by mouth at bedtime.  [name]  warfarin (COUMADIN) 5 MG tablet Take 1 1/2 tablets daily or as directed  . [DISCONTINUED] levothyroxine (SYNTHROID, LEVOTHROID) 88 MCG tablet Take 1 tablet (88 mcg total) by mouth daily before breakfast.   No facility-administered encounter medications on file as of 04/03/2018.    ALLERGIES: Allergies  Allergen Reactions  . Xanax [Alprazolam] Other (See Comments)    Pt feels very weak, tired and feels paralyzed     VACCINATION STATUS: Immunization History  Administered Date(s) Administered  . Influenza,inj,Quad PF,6+ Mos 08/01/2016, 07/11/2017    Diabetes  He presents for his follow-up diabetic visit. He has type 2 diabetes mellitus. Onset time: He was diagnosed at approximate age of 10 years. His disease course has been improving. There are no hypoglycemic associated symptoms. Pertinent negatives for hypoglycemia include no confusion, headaches, pallor or seizures. There are no diabetic associated symptoms. Pertinent negatives for diabetes include no chest pain, no fatigue, no polydipsia, no polyphagia, no polyuria and no weakness. There are no hypoglycemic complications. Symptoms are improving. Diabetic complications include heart disease. Risk factors for coronary artery disease include diabetes mellitus, dyslipidemia, hypertension, male sex and sedentary lifestyle. Current diabetic treatment includes oral agent (dual therapy). He is compliant with treatment most of the time. His weight is stable. He is following a diabetic diet. He participates in exercise intermittently. An ACE inhibitor/angiotensin II receptor blocker is being taken.  Hyperlipidemia  This is a chronic problem. The current episode started more than 1 year ago. Exacerbating diseases include diabetes. Pertinent negatives include no chest pain, myalgias or shortness of breath. Current antihyperlipidemic treatment includes statins. Risk factors for coronary artery disease include diabetes mellitus, dyslipidemia, hypertension and  male sex.  Hypertension  This is a chronic problem. The current episode started more than 1 year ago. Pertinent negatives include no chest pain, headaches, neck pain, palpitations or shortness of breath. Risk factors for coronary artery disease include diabetes mellitus and dyslipidemia. Hypertensive end-organ damage includes CAD/MI.    Review of Systems  Constitutional: Negative for fatigue and unexpected weight change.  HENT: Negative for dental problem, mouth sores and trouble swallowing.   Eyes: Negative for visual disturbance.  Respiratory: Negative for cough, choking, chest tightness, shortness of breath and wheezing.   Cardiovascular: Negative for chest pain, palpitations and leg swelling.  Gastrointestinal: Negative for abdominal distention, abdominal pain, constipation, diarrhea, nausea and vomiting.  Endocrine: Negative for polydipsia, polyphagia and polyuria.  Genitourinary: Negative for dysuria, flank pain, hematuria and urgency.  Musculoskeletal: Negative for back pain, gait problem, myalgias and neck pain.  Skin: Negative for pallor, rash and wound.  Neurological: Negative for seizures, syncope, weakness, numbness and headaches.  Psychiatric/Behavioral: Negative.  Negative for confusion and dysphoric mood.    Objective:    BP 109/73   Pulse 69   Ht 6' (1.829 m)   Wt 185 lb (83.9 kg)   BMI 25.09 kg/m   Wt Readings from Last 3 Encounters:  04/03/18 185 lb (83.9 kg)  02/19/18 185 lb 8 oz (84.1 kg)  11/28/17 188 lb 12.8 oz (85.6 kg)    Physical Exam  Constitutional: He is oriented to person, place, and time. He appears well-developed and well-nourished. He is cooperative. No distress.  HENT:  Head: Normocephalic and atraumatic.  Eyes: EOM are normal.  Neck: Normal range of motion. Neck supple. No tracheal deviation present. No thyromegaly present.  Cardiovascular: Normal rate, S1 normal and S2 normal. Exam reveals no gallop.  No murmur heard. Pulses:      Dorsalis  pedis pulses are 1+ on the right side, and 1+ on the left side.       Posterior tibial pulses are 1+ on the right side, and 1+ on the left side.  Pulmonary/Chest: Effort normal. No respiratory distress. He has no wheezes.  Abdominal: He exhibits no distension. There is no tenderness. There is no guarding and no CVA tenderness.  Musculoskeletal: He exhibits no edema.       Right shoulder: He exhibits no swelling and no deformity.  Neurological: He is alert and oriented to person, place, and time. He has normal strength and normal reflexes. No cranial nerve deficit or sensory deficit. Gait normal.  Skin: Skin is warm and dry. No rash noted. No cyanosis. Nails show no clubbing.  Psychiatric: He has a normal mood and affect. His speech is normal. Judgment normal. Cognition and memory are normal.    Chemistry (most recent): Lab Results  Component Value Date   NA 137 03/12/2018   K 5.1 03/12/2018   CL 97 03/12/2018   CO2 25 03/12/2018   BUN 39 (H) 03/12/2018   CREATININE 1.88 (H) 03/12/2018   Recent Results (from the past 2160 hour(s))  POCT INR     Status: Normal   Collection Time: 01/28/18 10:22 AM  Result Value Ref Range   INR 3.0   CUP PACEART REMOTE DEVICE CHECK     Status: None   Collection Time: 01/29/18 12:33 PM  Result Value Ref Range   Date Time Interrogation Session [id number]    Pulse Generator Manufacturer MERM    Pulse Gen Model [id number] Claria MRI Quad CRT-D    Pulse Gen Serial Number [id number]    Clinic Name [place]    Implantable Pulse Generator Type Cardiac Resynch Therapy Defibulator    Implantable Pulse Generator Implant Date 35361443    Implantable Lead Manufacturer [facility]    Implantable Lead Model 4598 Attain Performa S    Implantable Lead Serial Number [id number]    Implantable Lead Implant Date 15400867    Implantable Lead Location Detail 1 UNKNOWN    Implantable Lead Location [id number]    Implantable Lead Manufacturer MERM    Implantable Lead  Model 5076 CapSureFix Novus MRI SureScan    Implantable Lead Serial Number [id number]    Implantable  Lead Implant Date 16109604    Implantable Lead Location Detail 1 APPENDAGE    Implantable Lead Location [id number]    Implantable Lead Manufacturer [facility]    Implantable Lead Model 937-157-2120 Sprint Quattro Secure S MRI SureScan    Implantable Lead Serial Number [id number]    Implantable Lead Implant Date 11914782    Implantable Lead Location Detail 1 APEX    Implantable Lead Location [id number]    Lead Channel Setting Sensing Sensitivity 0.3 mV   Lead Channel Setting Pacing Amplitude 1.75 V   Lead Channel Setting Pacing Pulse Width 0.4 ms   Lead Channel Setting Pacing Amplitude 2 V   Lead Channel Setting Pacing Pulse Width 1 ms   Lead Channel Setting Pacing Amplitude 1.75 V   Lead Channel Setting Pacing Capture Mode Adaptive Capture    Lead Channel Impedance Value 399 ohm   Lead Channel Sensing Intrinsic Amplitude 1.25 mV   Lead Channel Sensing Intrinsic Amplitude 1.25 mV   Lead Channel Pacing Threshold Amplitude 0.875 V   Lead Channel Pacing Threshold Pulse Width 0.4 ms   Lead Channel Impedance Value 456 ohm   Lead Channel Impedance Value 361 ohm   Lead Channel Sensing Intrinsic Amplitude 12.375 mV   Lead Channel Sensing Intrinsic Amplitude 12.375 mV   Lead Channel Pacing Threshold Amplitude 0.5 V   Lead Channel Pacing Threshold Pulse Width 0.4 ms   HighPow Impedance 57 ohm   Lead Channel Impedance Value 760 ohm   Lead Channel Impedance Value 779 ohm   Lead Channel Impedance Value 779 ohm   Lead Channel Impedance Value 532 ohm   Lead Channel Impedance Value 722 ohm   Lead Channel Impedance Value 760 ohm   Lead Channel Impedance Value 456 ohm   Lead Channel Impedance Value 456 ohm   Lead Channel Impedance Value 456 ohm   Lead Channel Impedance Value 418 ohm   Lead Channel Impedance Value 228 ohm   Lead Channel Impedance Value 228 ohm   Lead Channel Impedance Value 218.087    Lead Channel  Impedance Value 218.087    Lead Channel Impedance Value 218.087    Lead Channel Pacing Threshold Amplitude 0.75 V   Lead Channel Pacing Threshold Pulse Width 1 ms   Battery Status OK    Battery Remaining Longevity 83 mo   Battery Voltage 2.98 V   Brady Statistic RA Percent Paced 39.41 %   Brady Statistic RV Percent Paced 9.32 %   Brady Statistic AP VP Percent 40.25 %   Brady Statistic AS VP Percent 57.01 %   Brady Statistic AP VS Percent 1.14 %   Brady Statistic AS VS Percent 1.61 %   Eval Rhythm AsVp, PVCs   Basic metabolic panel     Status: Abnormal   Collection Time: 02/19/18 10:18 AM  Result Value Ref Range   Sodium 136 135 - 145 mmol/L   Potassium 4.8 3.5 - 5.1 mmol/L   Chloride 107 101 - 111 mmol/L   CO2 22 22 - 32 mmol/L   Glucose, Bld 149 (H) 65 - 99 mg/dL   BUN 28 (H) 6 - 20 mg/dL   Creatinine, Ser 1.70 (H) 0.61 - 1.24 mg/dL   Calcium 8.9 8.9 - 10.3 mg/dL   GFR calc non Af Amer 39 (L) >60 mL/min   GFR calc Af Amer 45 (L) >60 mL/min    Comment: (NOTE) The eGFR has been calculated using the CKD EPI equation. This calculation has not been validated in all clinical  situations. eGFR's persistently <60 mL/min signify possible Chronic Kidney Disease.    Anion gap 7 5 - 15    Comment: Performed at [facility] [address] [place], [place] [address]  Digoxin level     Status: Abnormal   Collection Time: 02/19/18 10:18 AM  Result Value Ref Range   Digoxin Level 0.2 (L) 0.8 - 2.0 ng/mL    Comment: Performed at [facility], [place] [address], [place], [place] [address]  Lipid panel     Status: Abnormal   Collection Time: 02/19/18 10:18 AM  Result Value Ref Range   Cholesterol 122 0 - 200 mg/dL   Triglycerides 159 (H) <150 mg/dL   HDL 30 (L) >40 mg/dL   Total CHOL/HDL Ratio 4.1 RATIO   VLDL 32 0 - 40 mg/dL   LDL Cholesterol 60 0 - 99 mg/dL    Comment:        Total Cholesterol/HDL:CHD Risk Coronary Heart Disease Risk Table                     Men    Women  1/2 Average Risk   3.4   3.3  Average Risk       5.0   4.4  2 X Average Risk   9.6   7.1  3 X Average Risk  23.4   11.0        Use the calculated Patient Ratio above and the CHD Risk Table to determine the patient's CHD Risk.        ATP III CLASSIFICATION (LDL):  <100     mg/dL   Optimal  100-129  mg/dL   Near or Above                    Optimal  130-159  mg/dL   Borderline  160-189  mg/dL   High  >190     mg/dL   Very High Performed at [facility] [address], [place], [place] [address]   POCT INR     Status: Normal   Collection Time: 02/27/18  8:29 AM  Result Value Ref Range   INR 2.2 2.0 - 3.0  Lipid panel     Status: Abnormal   Collection Time: 03/04/18  9:49 AM  Result Value Ref Range   Cholesterol, Total 130 100 - 199 mg/dL   Triglycerides 139 0 - 149 mg/dL   HDL 32 (L) >39 mg/dL   VLDL Cholesterol Cal 28 5 - 40 mg/dL   LDL Calculated 70 0 - 99 mg/dL   Chol/HDL Ratio 4.1 0.0 - 5.0 ratio    Comment:                                   T. Chol/HDL Ratio                                             Men  Women                               1/2 Avg.Risk  3.4    3.3  Avg.Risk  5.0    4.4                                2X Avg.Risk  9.6    7.1                                3X Avg.Risk 23.4   11.0   Comprehensive metabolic panel     Status: Abnormal   Collection Time: 03/04/18  9:49 AM  Result Value Ref Range   Glucose 126 (H) 65 - 99 mg/dL   BUN 37 (H) 8 - 27 mg/dL   Creatinine, Ser 1.94 (H) 0.76 - 1.27 mg/dL   GFR calc non Af Amer 34 (L) >59 mL/min/1.73   GFR calc Af Amer 39 (L) >59 mL/min/1.73   BUN/Creatinine Ratio 19 10 - 24   Sodium 137 134 - 144 mmol/L   Potassium 5.3 (H) 3.5 - 5.2 mmol/L   Chloride 100 96 - 106 mmol/L   CO2 23 20 - 29 mmol/L   Calcium 9.3 8.6 - 10.2 mg/dL   Total Protein 7.0 6.0 - 8.5 g/dL   Albumin 4.3 3.5 - 4.8 g/dL   Globulin, Total 2.7 1.5 - 4.5 g/dL   Albumin/Globulin Ratio 1.6 1.2 -  2.2   Bilirubin Total 0.5 0.0 - 1.2 mg/dL   Alkaline Phosphatase 67 39 - 117 IU/L   AST 16 0 - 40 IU/L   ALT 17 0 - 44 IU/L  Hgb A1c w/o eAG     Status: Abnormal   Collection Time: 03/04/18  9:49 AM  Result Value Ref Range   Hgb A1c MFr Bld 6.2 (H) 4.8 - 5.6 %    Comment:          Prediabetes: 5.7 - 6.4          Diabetes: >6.4          Glycemic control for adults with diabetes: <7.0   T4, free     Status: Abnormal   Collection Time: 03/04/18  9:49 AM  Result Value Ref Range   Free T4 1.79 (H) 0.82 - 1.77 ng/dL  TSH     Status: None   Collection Time: 03/04/18  9:49 AM  Result Value Ref Range   TSH 2.240 0.450 - 4.500 uIU/mL  Specimen status report     Status: None   Collection Time: 03/04/18  9:49 AM  Result Value Ref Range   specimen status report Comment     Comment: Isac Caddy CMP14 Default Ambig Abbrev CMP14 Default A hand-written panel/profile was received from your office. In accordance with the [facility] Ambiguous Test Code Policy dated July 1740, we have completed your order by using the closest currently or formerly recognized AMA panel.  We have assigned Comprehensive Metabolic Panel (14), Test Code #[id number] to this request.  If this is not the testing you wished to receive on this specimen, please contact the [facility] Client Inquiry/Technical Services Department to clarify the test order.  We appreciate your business.   Basic metabolic panel     Status: Abnormal   Collection Time: 03/04/18  9:50 AM  Result Value Ref Range   Glucose 127 (H) 65 - 99 mg/dL   BUN 36 (H) 8 - 27 mg/dL   Creatinine, Ser 1.84 (H) 0.76 - 1.27 mg/dL   GFR calc non Af Amer 36 (L) >  59 mL/min/1.73   GFR calc Af Amer 42 (L) >59 mL/min/1.73   BUN/Creatinine Ratio 20 10 - 24   Sodium 138 134 - 144 mmol/L   Potassium 5.4 (H) 3.5 - 5.2 mmol/L   Chloride 101 96 - 106 mmol/L   CO2 23 20 - 29 mmol/L   Calcium 9.4 8.6 - 10.2 mg/dL  Specimen status report     Status: None   Collection Time:  03/04/18  9:50 AM  Result Value Ref Range   specimen status report Comment     Comment: Isac Caddy BMP8 Default Ambig Abbrev BMP8 Default A hand-written panel/profile was received from your office. In accordance with the [facility] Ambiguous Test Code Policy dated July 1779, we have completed your order by using the closest currently or formerly recognized AMA panel.  We have assigned Basic Metabolic Panel (8), Test Code [phone] to this request. If this is not the testing you wished to receive on this specimen, please contact the [facility] Client Inquiry/Technical Services Department to clarify the test order.  We appreciate your business.   Basic metabolic panel     Status: Abnormal   Collection Time: 03/12/18 10:47 AM  Result Value Ref Range   Glucose 102 (H) 65 - 99 mg/dL   BUN 39 (H) 8 - 27 mg/dL   Creatinine, Ser 1.88 (H) 0.76 - 1.27 mg/dL   GFR calc non Af Amer 35 (L) >59 mL/min/1.73   GFR calc Af Amer 41 (L) >59 mL/min/1.73   BUN/Creatinine Ratio 21 10 - 24   Sodium 137 134 - 144 mmol/L   Potassium 5.1 3.5 - 5.2 mmol/L   Chloride 97 96 - 106 mmol/L   CO2 25 20 - 29 mmol/L   Calcium 9.7 8.6 - 10.2 mg/dL  Specimen status report     Status: None   Collection Time: 03/12/18 10:47 AM  Result Value Ref Range   specimen status report Comment     Comment: Isac Caddy BMP8 Default Ambig Abbrev BMP8 Default A hand-written panel/profile was received from your office. In accordance with the [facility] Ambiguous Test Code Policy dated July 9233, we have completed your order by using the closest currently or formerly recognized AMA panel.  We have assigned Basic Metabolic Panel (8), Test Code [phone] to this request. If this is not the testing you wished to receive on this specimen, please contact the [facility] Client Inquiry/Technical Services Department to clarify the test order.  We appreciate your business.   POCT INR     Status: Abnormal   Collection Time: 03/27/18  8:48 AM   Result Value Ref Range   INR 3.3 (A) 2.0 - 3.0    Assessment & Plan:   1. Type 2 diabetes mellitus with other circulatory complication ([place]), stage 3 CKD   His diabetes is  complicated by coronary artery disease status post coronary artery bypass graft and recent ACS and patient remains at a high risk for more acute and chronic complications of diabetes which include CAD, CVA, CKD, retinopathy, and neuropathy. These are all discussed in detail with the patient.  He continues to do well without insulin treatment, A1c is stable at 6.2%.  His renal function continues to be at stage  3. - I reviewed his recent labs. - I have re-counseled the patient on diet management  by adopting a carbohydrate restricted / protein rich  Diet.  -  Suggestion is made for him to avoid simple carbohydrates  from his diet including Cakes, Sweet  Desserts / Pastries, Ice Cream, Soda (diet and regular), Sweet Tea, Candies, Chips, Cookies, Store Bought Juices, Alcohol in Excess of  1-2 drinks a day, Artificial Sweeteners, and "Sugar-free" Products. This will help patient to have stable blood glucose profile and potentially avoid unintended weight gain.  - Patient is advised to stick to a routine mealtimes to eat 3 meals  a day and avoid unnecessary snacks ( to snack only to correct hypoglycemia).   - I have approached patient with the following individualized plan to manage diabetes and patient agrees.  -   His only oral option for treating diabetes is DPP IV inhibitors, I advised him to continue Tradjenta 5 mg p.o. daily with breakfast.   - He will not need insulin treatment at this time.  - Patient specific target  for A1c; LDL, HDL, Triglycerides, and  Waist Circumference were discussed in detail.  2) BP/HTN: His blood pressure is controlled to target.  Reportedly he runs low blood pressure at home and would not tolerate any additional therapy. I advised him to continue his current medications including beta  blocker, spironolactone and,  Lasix as needed.  3) Lipids/HPL: His recent lipid panel showed controlled with  LDL at 63 in July 2018, he is advised to continue Crestor 10 mg p.o. nightly.  [name] 4)  Weight/Diet: CDE consult in progress, exercise, and carbohydrates information provided.  5) hypothyroidism: - This is related to his therapy with amiodarone. -His recent thyroid function tests are consistent with slight over replacement.   -I approached him for lower dose of levothyroxine 75 mcg p.o. daily before breakfast  and he accepts.     - We discussed about correct intake of levothyroxine, at fasting, with water, separated by at least 30 minutes from breakfast, and separated by more than 4 hours from calcium, iron, multivitamins, acid reflux medications (PPIs). -Patient is made aware of the fact that thyroid hormone replacement is needed for life, dose to be adjusted by periodic monitoring of thyroid function tests.   6 ) vitamin D deficiency: He is status post therapy with  vitamin D 50,000 units weekly for 12 weeks. He will need vitamin D checked after next visit.  7) Chronic Care/Health Maintenance:  -Patient is  on ACEI/ARB and Statin medications and encouraged to continue to follow up with Ophthalmology, Podiatrist at least yearly or according to recommendations, and advised to  stay away from smoking. I have recommended yearly flu vaccine and pneumonia vaccination at least every 5 years, and  sleep for at least 7 hours a day.  I advised patient to maintain close follow up with his cardiologist and his  PCP for primary care needs.  - Time spent with the patient: 25 min, of which >50% was spent in reviewing his  current and  previous labs, previous treatments, and medications doses and developing a plan for long-term care.  Christian Bowman participated in the discussions, expressed understanding, and voiced agreement with the above plans.  All questions were answered to his satisfaction. he is  encouraged to contact clinic should he have any questions or concerns prior to his return visit.  Follow up plan: Return in about 6 months (around 10/04/2018).  Glade Lloyd, MD Phone: [phone]  Fax: [phone]   This note was partially dictated with voice recognition software. Similar sounding words can be transcribed inadequately or may not  be corrected upon review.  04/03/2018, 9:49 AM

## 2018-04-05 ENCOUNTER — Ambulatory Visit: Payer: Medicare Other | Admitting: "Endocrinology

## 2018-04-10 ENCOUNTER — Ambulatory Visit (INDEPENDENT_AMBULATORY_CARE_PROVIDER_SITE_OTHER): Payer: Medicare Other | Admitting: Cardiovascular Disease

## 2018-04-10 ENCOUNTER — Encounter: Payer: Self-pay | Admitting: Cardiovascular Disease

## 2018-04-10 VITALS — BP 111/62 | HR 76 | Ht 72.0 in | Wt 185.6 lb

## 2018-04-10 DIAGNOSIS — I10 Essential (primary) hypertension: Secondary | ICD-10-CM | POA: Diagnosis not present

## 2018-04-10 DIAGNOSIS — I5022 Chronic systolic (congestive) heart failure: Secondary | ICD-10-CM

## 2018-04-10 DIAGNOSIS — I701 Atherosclerosis of renal artery: Secondary | ICD-10-CM

## 2018-04-10 DIAGNOSIS — I255 Ischemic cardiomyopathy: Secondary | ICD-10-CM | POA: Diagnosis not present

## 2018-04-10 DIAGNOSIS — N183 Chronic kidney disease, stage 3 unspecified: Secondary | ICD-10-CM

## 2018-04-10 DIAGNOSIS — Z95810 Presence of automatic (implantable) cardiac defibrillator: Secondary | ICD-10-CM | POA: Diagnosis not present

## 2018-04-10 DIAGNOSIS — I484 Atypical atrial flutter: Secondary | ICD-10-CM

## 2018-04-10 DIAGNOSIS — E1122 Type 2 diabetes mellitus with diabetic chronic kidney disease: Secondary | ICD-10-CM | POA: Diagnosis not present

## 2018-04-10 DIAGNOSIS — I25708 Atherosclerosis of coronary artery bypass graft(s), unspecified, with other forms of angina pectoris: Secondary | ICD-10-CM

## 2018-04-10 DIAGNOSIS — E782 Mixed hyperlipidemia: Secondary | ICD-10-CM | POA: Diagnosis not present

## 2018-04-10 DIAGNOSIS — Z953 Presence of xenogenic heart valve: Secondary | ICD-10-CM

## 2018-04-10 MED ORDER — FUROSEMIDE 20 MG PO TABS: 20.0000 mg | ORAL_TABLET | ORAL | 6 refills | Status: DC

## 2018-04-10 MED ORDER — AMOXICILLIN 500 MG PO TABS: 2000.0000 mg | ORAL_TABLET | ORAL | 2 refills | Status: DC | PRN

## 2018-04-10 NOTE — Patient Instructions (Signed)
Dr Sallyanne Kuster has recommended making the following medication changes: 1. STOP Digoxin 2. IN 2 WEEKS, DECREASE Furosemide to 1 tablet twice weekly  Remote monitoring is used to monitor your Pacemaker or ICD from home. This monitoring reduces the number of office visits required to check your device to one time per year. It allows [place] to keep an eye on the functioning of your device to ensure it is working properly. You are scheduled for a device check from home on Tuesday, July 30th, 2019. You may send your transmission at any time that day. If you have a wireless device, the transmission will be sent automatically. After your physician reviews your transmission, you will receive a postcard with your next transmission date.  To improve our patient care and to more adequately follow your device, [facility] has decided, as a practice, to start following each patient four times a year with your home monitor. This means that you may experience a remote appointment that is close to an in-office appointment with your physician. Your insurance will apply at the same rate as other remote monitoring transmissions.  Dr Sallyanne Kuster recommends that you schedule a follow-up appointment in 6 months with an ICD check. You will receive a reminder letter in the mail two months in advance. If you don't receive a letter, please call our office to schedule the follow-up appointment.  If you need a refill on your cardiac medications before your next appointment, please call your pharmacy.

## 2018-04-12 NOTE — Progress Notes (Signed)
Cardiology Office Note    Date:  04/12/2018   ID:  Christian Bowman, DOB 1946/01/21, MRN [id number]  PCP:  Sharilyn Sites, MD  Cardiologist:  Loralie Champagne, MD; Allegra Lai, MD; Sanda Klein, MD   Chief Complaint  Patient presents with  . Follow-up    History of Present Illness:  Christian Bowman is a 72 y.o. male with extensive and severe cardiac problems.  He is doing quite well right now.  He exercises regularly and has NYHA functional class II status.  He is generally very compliant with sodium restriction and his medications.  He has complaints suggestive of orthostatic hypotension.  He is on moderate dose Entresto as well as moderate dose carvedilol and low doses of spironolactone and furosemide.  He only takes the furosemide every other day.  He never has leg edema.  He denies orthopnea, PND, syncope or palpitations.  Glycemic control is reported as very good.  At home he weighs consistently 179-180 pounds.  Our office scale overestimates his home weight by about 5 pounds.  He has not had angina pectoris.  He has gained a little bit of true weight since he is eating better.  Interrogation of his Medtronic CRT-D device shows normal device function.  There have been no episodes of atrial mode switch or ventricular tachycardia.  He has 46% atrial pacing and 95% biventricular pacing.  OptiVol suggest that he is getting "dry" with a steady upward swing above baseline over the last month.  Lead parameters remain excellent and the estimated generator longevity is over 6 years.  He underwent redo coronary artery bypass 2 (SVG to PDA, SVG to ramus), as well as mitral valve replacement (bioprosthetic Edwards magna 31 mm) on 56/21/3086, complicated by AV block and atrial flutter requiring cardioversion. Estimated EF 20-25 % on his 07/18/16 TEE. A biventricular defibrillator (Medtronic Cl[place] MRI CRT-D) was implanted on July 25, 2016. On December 18, 2016 he was hospitalized for small non-STEMI and was  found to have interval occlusion of the old SVG to PDA, while the more recently placed SVG was still patent. LVEDP was quite low at only 3 mmHg during that catheterization. He had atrial flutter ablation in August 2018 and is off amiodarone. He had a NSTEMI in November 2018 after inguinal hernia repair surgery.  He understands the need for endocarditis prophylaxis with dental procedures or other medical procedures that might cause bacteremia and the indication for anticoagulation chronically.  He is still following up with Dr. Algernon Huxley and Dr. Curt Bears  Past Medical History:  Diagnosis Date  . AICD (automatic cardioverter/defibrillator) present    medtronic-   DR. Clarke Peretz , DR. Aundra Dubin   . Anginal pain ([place])    cp sat 08/11/17  . Anxiety   . CAD (coronary artery disease)   . CHF (congestive heart failure) ([place])   . Complication of anesthesia    took awhile to wake up   . Coronary artery disease involving coronary bypass graft   . Cyst of neck    right side  . DM2 (diabetes mellitus, type 2) ([place]) 08/26/2013  . Dyspnea   . Heart attack ([place])    "not sure when" (08/20/2017)  . HTN (hypertension) 08/26/2013  . Hyperlipidemia 08/26/2013  . Hypothyroidism   . Left main coronary artery disease   . Left renal artery stenosis ([place])    Genesis 6x12 stent 2007  . Obesity (BMI 30.0-34.9) 08/26/2013  . Postoperative atrial fibrillation ([place]) 10/15/2005  . Presence of permanent  cardiac pacemaker   . S/P CABG x 4 10/13/2005   LIMA to LAD, SVG to intermediate branch, sequential SVG to PDA and RPL branch, EVH via right thigh  . S/P mitral valve replacement with bioprosthetic valve 07/11/2016   31 mm [facility] Mitral bovine bioprosthetic tissue valve  . S/P redo CABG x 2 07/11/2016   SVG to PDA and SVG to Intermediate Branch, EVH via left thigh    Past Surgical History:  Procedure Laterality Date  . A-FLUTTER ABLATION N/A 05/11/2017   Procedure: A-Flutter Ablation;  Surgeon: Constance Haw, MD;  Location: [place] [facility];  Service: Cardiovascular;  Laterality: N/A;  . CARDIAC CATHETERIZATION N/A 06/21/2016   Procedure: Right/Left Heart Cath and Coronary/Graft Angiography;  Surgeon: Sherren Mocha, MD;  Location: [place] [facility];  Service: Cardiovascular;  Laterality: N/A;  . CARDIAC VALVE REPLACEMENT    . CARDIOVERSION N/A 07/19/2016   Procedure: CARDIOVERSION;  Surgeon: Lelon Perla, MD;  Location: [facility] ENDOSCOPY;  Service: Cardiovascular;  Laterality: N/A;  . CARDIOVERSION N/A 09/08/2016   Procedure: CARDIOVERSION;  Surgeon: Fay Records, MD;  Location: [place];  Service: Cardiovascular;  Laterality: N/A;  . CORONARY ANGIOPLASTY     STENT 2016  [place]     DES in SVG to right coronary artery system  . CORONARY ARTERY BYPASS GRAFT  10/13/2005   LIMA to LAD, SVG to intermediate branch, sequential SVG to PDA and RPL  . CORONARY ARTERY BYPASS GRAFT N/A 07/11/2016   Procedure: REDO CORONARY ARTERY BYPASS GRAFTING (CABG) x two using left leg greater saphenous vein harvested endoscopically-SVG to PDA -SVG to RAMUS INTERMEDIATE;  Surgeon: Rexene Alberts, MD;  Location: [place];  Service: Open Heart Surgery;  Laterality: N/A;  . CORONARY ARTERY BYPASS GRAFT N/A 07/11/2016   Procedure: Re-exploration (CABG) for post op bleeding,;  Surgeon: Rexene Alberts, MD;  Location: [place];  Service: Open Heart Surgery;  Laterality: N/A;  . EP IMPLANTABLE DEVICE N/A 07/25/2016   Procedure: BiV ICD Insertion CRT-D;  Surgeon: Will Meredith Leeds, MD;  Location: [place] [facility];  Service: Cardiovascular;  Laterality: N/A;  . INGUINAL HERNIA REPAIR Left 08/20/2017  . INGUINAL HERNIA REPAIR Left 08/20/2017   Procedure: OPEN LEFT INGUINAL HERNIA REPAIR;  Surgeon: Alphonsa Overall, MD;  Location: [place];  Service: General;  Laterality: Left;  . LEFT HEART CATH AND CORS/GRAFTS ANGIOGRAPHY N/A 12/18/2016   Procedure: Left Heart Cath and  Cors/Grafts Angiography;  Surgeon: Sherren Mocha, MD;  Location: [place] CV LAB;  Service: Cardiovascular;  Laterality: N/A;  . LEFT HEART CATH AND CORS/GRAFTS ANGIOGRAPHY N/A 08/22/2017   Procedure: LEFT HEART CATH AND CORS/GRAFTS ANGIOGRAPHY;  Surgeon: Larey Dresser, MD;  Location: [place] [facility];  Service: Cardiovascular;  Laterality: N/A;  . MITRAL VALVE REPLACEMENT N/A 07/11/2016   Procedure: MITRAL VALVE (MV) REPLACEMENT;  Surgeon: Rexene Alberts, MD;  Location: [place];  Service: Open Heart Surgery;  Laterality: N/A;  . MYOCARDICAL PERFUSION  10/09/2007   NORMAL PERFUSION IN ALL REGIONS;NO EVIDENCE OF INDUCIBLE ISCHEMIA;POST STRESS EF% 66  . RENAL ARTERY STENT Right 2007  . RENAL DOPPLER  03/28/2010   RIGHT RA-NORMAL;LEFT PROXIMAL RA AT STENT-PATENT WITH NO EVIDENCE OF SIGN DIAMETER REDUCTION. R & L KIDNEYS: EQUAL IN SIZE,SYMMETRICAL IN SHAPE.  . TEE WITHOUT CARDIOVERSION N/A 06/15/2016   Procedure: TRANSESOPHAGEAL ECHOCARDIOGRAM (TEE);  Surgeon: Sanda Klein, MD;  Location: [place];  Service: Cardiovascular;  Laterality: N/A;  . TEE WITHOUT CARDIOVERSION N/A 07/11/2016   Procedure: TRANSESOPHAGEAL ECHOCARDIOGRAM (TEE);  Surgeon: Rexene Alberts, MD;  Location: [place];  Service: Open Heart Surgery;  Laterality: N/A;  . TEE WITHOUT CARDIOVERSION N/A 07/19/2016   Procedure: TRANSESOPHAGEAL ECHOCARDIOGRAM (TEE);  Surgeon: Lelon Perla, MD;  Location: [facility] ENDOSCOPY;  Service: Cardiovascular;  Laterality: N/A;  . TRANSESOPHAGEAL ECHOCARDIOGRAM  10/19/2005   NORMAL LV; MILD TO MODERATE AMOUNT OF SOFT ATHEROMATOUS PLAQUE OF THE THORACIC AORTA; THE LEFT ATRIUM IS MILDLY DILATED;LEFT ATRIAL APPENDAGE FUNCTION IS NORMAL;NO THROMBUS IDENTIFIED. SMALL PFO WITH RIGHT TO LEFT SHUNT    Current Medications: Outpatient Medications Prior to Visit  Medication Sig Dispense Refill  . ACCU-CHEK SOFTCLIX LANCETS lancets Use as instructed. E11.65 100 each 2  . aspirin EC 81 MG tablet Take 81 mg  by mouth daily.     . carvedilol (COREG) 12.5 MG tablet Take 1 tablet (12.5 mg total) by mouth 2 (two) times daily with a meal. 60 tablet 3  . levothyroxine (SYNTHROID, LEVOTHROID) 75 MCG tablet Take 1 tablet (75 mcg total) by mouth daily before breakfast. 90 tablet 1  . linagliptin (TRADJENTA) 5 MG TABS tablet Take 1 tablet (5 mg total) by mouth daily. 90 tablet 2  . neomycin-bacitracin-polymyxin (NEOSPORIN) ointment Apply 1 application topically daily as needed for wound care.    . nitroGLYCERIN (NITROSTAT) 0.4 MG SL tablet DISSOLVE ONE TABLET UNDER THE TONGUE EVERY 5 MINUTES AS NEEDED FOR CHEST PAIN.  DO NOT EXCEED A TOTAL OF 3 DOSES IN 15 MINUTES 25 tablet 15  . polyethylene glycol (MIRALAX / GLYCOLAX) packet Take 17 g by mouth daily as needed for mild constipation or moderate constipation.    . rosuvastatin (CRESTOR) 10 MG tablet Take 10 mgs by mouth once daily in the evening 90 tablet 3  . sacubitril-valsartan (ENTRESTO) 49-51 MG Take 1 tablet by mouth 2 (two) times daily. 180 tablet 3  . spironolactone (ALDACTONE) 25 MG tablet Take 1 tablet (25 mg total) by mouth at bedtime. 30 tablet 6  . warfarin (COUMADIN) 5 MG tablet Take 1 1/2 tablets daily or as directed 135 tablet 3  . amoxicillin (AMOXIL) 500 MG tablet Take 4 tablets (2,000 mg total) by mouth as needed. Pre-dental work only. 4 tablet 2  . digoxin (LANOXIN) 0.125 MG tablet Take 0.5 tablets (0.0625 mg total) by mouth daily. 45 tablet 1  . furosemide (LASIX) 20 MG tablet Take 1 tablet (20 mg total) by mouth every other day. 30 tablet 11   No facility-administered medications prior to visit.      Allergies:   Xanax [alprazolam]   Social History   Socioeconomic History  . Marital status: Married    Spouse name: Not on file  . Number of children: Not on file  . Years of education: Not on file  . Highest education level: Not on file  Occupational History  . Not on file  Social Needs  . Financial resource strain: Not on file  .  Food insecurity:    Worry: Not on file    Inability: Not on file  . Transportation needs:    Medical: Not on file    Non-medical: Not on file  Tobacco Use  . Smoking status: Never Smoker  . Smokeless tobacco: Never Used  Substance and Sexual Activity  . Alcohol use: No  . Drug use: No  . Sexual activity: Never  Lifestyle  . Physical activity:    Days per week: Not  on file    Minutes per session: Not on file  . Stress: Not on file  Relationships  . Social connections:    Talks on phone: Not on file    Gets together: Not on file    Attends religious service: Not on file    Active member of club or organization: Not on file    Attends meetings of clubs or organizations: Not on file    Relationship status: Not on file  Other Topics Concern  . Not on file  Social History Narrative  . Not on file     Family History:  The patient's family history includes Heart attack in his father and mother; Heart disease in his father, maternal grandmother, and mother; Hypertension in his father and sister.   ROS:   Please see the history of present illness.    ROS All other systems reviewed and are negative.   PHYSICAL EXAM:   VS:  BP 111/62   Pulse 76   Ht 6' (1.829 m)   Wt 185 lb 9.6 oz (84.2 kg)   BMI 25.17 kg/m      General: Alert, oriented x3, no distress, looks well. Head: no evidence of trauma, PERRL, EOMI, no exophtalmos or lid lag, no myxedema, no xanthelasma; normal ears, nose and oropharynx.  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 Neck: normal jugular venous pulsations and no hepatojugular reflux; brisk carotid pulses without delay and no carotid bruits Chest: clear to auscultation, no signs of consolidation by percussion or palpation, normal fremitus, symmetrical and full respiratory excursions Cardiovascular: normal position and quality of the apical impulse, regular rhythm, normal first and second heart sounds, no murmurs, rubs or gallops Abdomen: no tenderness or  distention, no masses by palpation, no abnormal pulsatility or arterial bruits, normal bowel sounds, no hepatosplenomegaly Extremities: no clubbing, cyanosis or edema; 2+ radial, ulnar and brachial pulses bilaterally; 2+ right femoral, posterior tibial and dorsalis pedis pulses; 2+ left femoral, posterior tibial and dorsalis pedis pulses; no subclavian or femoral bruits Neurological: grossly nonfocal Psych: Normal mood and affect  Wt Readings from Last 3 Encounters:  04/10/18 185 lb 9.6 oz (84.2 kg)  04/03/18 185 lb (83.9 kg)  02/19/18 185 lb 8 oz (84.1 kg)      Studies/Labs Reviewed:   EKG:  EKG is not ordered today.    Recent Labs: 08/23/2017: Hemoglobin 11.8; Platelets 120 03/04/2018: ALT 17; TSH 2.240 03/12/2018: BUN 39; Creatinine, Ser 1.88; Potassium 5.1; Sodium 137   Lipid Panel    Component Value Date/Time   CHOL 130 03/04/2018 0949   TRIG 139 03/04/2018 0949   HDL 32 (L) 03/04/2018 0949   CHOLHDL 4.1 03/04/2018 0949   CHOLHDL 4.1 02/19/2018 1018   VLDL 32 02/19/2018 1018   LDLCALC 70 03/04/2018 0949    ASSESSMENT:    1. Chronic systolic CHF (congestive heart failure) ([place])   2. Coronary artery disease of bypass graft of native heart with stable angina pectoris ([place])   3. Atypical atrial flutter ([place])   4. Presence of biventricular implantable cardioverter-defibrillator (ICD)   5. S/P mitral valve replacement with bioprosthetic valve + redo CABG x2   6. Essential hypertension   7. Mixed hyperlipidemia   8. Type 2 diabetes mellitus with stage 3 chronic kidney disease, without long-term current use of insulin ([place])   9. Left renal artery stenosis ([facility])      PLAN:  In order of problems listed above:  1. CHF: He is doing well and I think  we can stop his digoxin.  NYHA functional class I-2, euvolemic on a very low dose of loop diuretic.  In fact he looks a little dry.  We will cut down his furosemide to 2 doses a week, I asked him to do this only 2 weeks after we  stop his digoxin.  Continue Entresto, carvedilol, spironolactone 2. CAD: Currently no complaints of angina pectoris 3. AFlutter: It's been almost a year since successful flutter ablation, without arrhythmia recurrence.  I will send a message to Dr. Curt Bears to see if he agrees that we can try to stop his warfarin; they have an appointment in September.  He has not had any bleeding complications. 4. S/P MVR: Bioprosthetic valve (31 mm Edwards magna bovine) with normal function by recent echo.  He will get a refill prescription for amoxicillin as needed for dental procedures 5. CRT-D: Device function, satisfactory biventricular pacing percentage. 6. Hx HTN: Well-controlled 7. HLP: On statin.  LDL at target 8. DM: Well-controlled. 9. History of left renal artery stenosis status post stent 2007.   Medication Adjustments/Labs and Tests Ordered: Current medicines are reviewed at length with the patient today.  Concerns regarding medicines are outlined above.  Medication changes, Labs and Tests ordered today are listed in the Patient Instructions below. Patient Instructions  Dr Sallyanne Kuster has recommended making the following medication changes: 1. STOP Digoxin 2. IN 2 WEEKS, DECREASE Furosemide to 1 tablet twice weekly  Remote monitoring is used to monitor your Pacemaker or ICD from home. This monitoring reduces the number of office visits required to check your device to one time per year. It allows [place] to keep an eye on the functioning of your device to ensure it is working properly. You are scheduled for a device check from home on Tuesday, July 30th, 2019. You may send your transmission at any time that day. If you have a wireless device, the transmission will be sent automatically. After your physician reviews your transmission, you will receive a postcard with your next transmission date.  To improve our patient care and to more adequately follow your device, [facility] has decided, as a practice,  to start following each patient four times a year with your home monitor. This means that you may experience a remote appointment that is close to an in-office appointment with your physician. Your insurance will apply at the same rate as other remote monitoring transmissions.  Dr Sallyanne Kuster recommends that you schedule a follow-up appointment in 6 months with an ICD check. You will receive a reminder letter in the mail two months in advance. If you don't receive a letter, please call our office to schedule the follow-up appointment.  If you need a refill on your cardiac medications before your next appointment, please call your pharmacy.    Signed, Sanda Klein, MD  04/12/2018 5:53 PM    [address], [place], [place]  [address] Phone: [phone]; Fax: [phone]

## 2018-04-22 NOTE — Progress Notes (Signed)
Thanks, Will.  I will let him know we will stop his anticoagulant. MCr

## 2018-04-30 ENCOUNTER — Ambulatory Visit (INDEPENDENT_AMBULATORY_CARE_PROVIDER_SITE_OTHER): Payer: Medicare Other | Admitting: *Deleted

## 2018-04-30 DIAGNOSIS — I255 Ischemic cardiomyopathy: Secondary | ICD-10-CM | POA: Diagnosis not present

## 2018-04-30 NOTE — Progress Notes (Signed)
Remote ICD transmission.   

## 2018-05-01 ENCOUNTER — Encounter: Payer: Self-pay | Admitting: Cardiology

## 2018-05-04 LAB — CUP PACEART REMOTE DEVICE CHECK
Battery Remaining Longevity: 75 mo
Battery Voltage: 2.98 V
Brady Statistic AP VP Percent: 41.09 %
Brady Statistic AS VS Percent: 1.04 %
Brady Statistic RA Percent Paced: 41.19 %
HIGH POWER IMPEDANCE MEASURED VALUE: 62 Ohm
Implantable Lead Implant Date: 20171024
Implantable Lead Location: 753858
Implantable Lead Location: 753859
Implantable Lead Model: 4598
Implantable Lead Model: 5076
Lead Channel Impedance Value: 218.087
Lead Channel Impedance Value: 222.34 Ohm
Lead Channel Impedance Value: 232.653
Lead Channel Impedance Value: 342 Ohm
Lead Channel Impedance Value: 418 Ohm
Lead Channel Impedance Value: 456 Ohm
Lead Channel Impedance Value: 475 Ohm
Lead Channel Impedance Value: 760 Ohm
Lead Channel Impedance Value: 817 Ohm
Lead Channel Impedance Value: 817 Ohm
Lead Channel Pacing Threshold Amplitude: 0.375 V
Lead Channel Pacing Threshold Amplitude: 0.875 V
Lead Channel Pacing Threshold Pulse Width: 1 ms
Lead Channel Sensing Intrinsic Amplitude: 0.875 mV
Lead Channel Sensing Intrinsic Amplitude: 11.125 mV
Lead Channel Setting Pacing Amplitude: 2 V
Lead Channel Setting Pacing Amplitude: 2.25 V
Lead Channel Setting Pacing Pulse Width: 1 ms
MDC IDC LEAD IMPLANT DT: 20171024
MDC IDC LEAD IMPLANT DT: 20171024
MDC IDC LEAD LOCATION: 753860
MDC IDC MSMT LEADCHNL LV IMPEDANCE VALUE: 222.34 Ohm
MDC IDC MSMT LEADCHNL LV IMPEDANCE VALUE: 237.5 Ohm
MDC IDC MSMT LEADCHNL LV IMPEDANCE VALUE: 475 Ohm
MDC IDC MSMT LEADCHNL LV IMPEDANCE VALUE: 551 Ohm
MDC IDC MSMT LEADCHNL LV IMPEDANCE VALUE: 779 Ohm
MDC IDC MSMT LEADCHNL LV IMPEDANCE VALUE: 779 Ohm
MDC IDC MSMT LEADCHNL RA IMPEDANCE VALUE: 456 Ohm
MDC IDC MSMT LEADCHNL RA PACING THRESHOLD AMPLITUDE: 1 V
MDC IDC MSMT LEADCHNL RA PACING THRESHOLD PULSEWIDTH: 0.4 ms
MDC IDC MSMT LEADCHNL RA SENSING INTR AMPL: 0.875 mV
MDC IDC MSMT LEADCHNL RV IMPEDANCE VALUE: 456 Ohm
MDC IDC MSMT LEADCHNL RV PACING THRESHOLD PULSEWIDTH: 0.4 ms
MDC IDC MSMT LEADCHNL RV SENSING INTR AMPL: 11.125 mV
MDC IDC PG IMPLANT DT: 20171024
MDC IDC SESS DTM: 20190730191926
MDC IDC SET LEADCHNL RA PACING AMPLITUDE: 2 V
MDC IDC SET LEADCHNL RV PACING PULSEWIDTH: 0.4 ms
MDC IDC SET LEADCHNL RV SENSING SENSITIVITY: 0.3 mV
MDC IDC STAT BRADY AP VS PERCENT: 0.76 %
MDC IDC STAT BRADY AS VP PERCENT: 57.11 %
MDC IDC STAT BRADY RV PERCENT PACED: 11.56 %

## 2018-05-08 ENCOUNTER — Ambulatory Visit: Payer: Self-pay | Admitting: *Deleted

## 2018-05-23 LAB — CUP PACEART INCLINIC DEVICE CHECK
Date Time Interrogation Session: 20190822162434
Implantable Lead Implant Date: 20171024
Implantable Lead Implant Date: 20171024
Implantable Lead Location: 753858
Implantable Lead Location: 753860
Implantable Lead Model: 5076
Implantable Pulse Generator Implant Date: 20171024
Lead Channel Setting Pacing Amplitude: 1.75 V
Lead Channel Setting Sensing Sensitivity: 0.3 mV
MDC IDC LEAD IMPLANT DT: 20171024
MDC IDC LEAD LOCATION: 753859
MDC IDC SET LEADCHNL LV PACING AMPLITUDE: 1.75 V
MDC IDC SET LEADCHNL LV PACING PULSEWIDTH: 1 ms
MDC IDC SET LEADCHNL RV PACING AMPLITUDE: 2 V
MDC IDC SET LEADCHNL RV PACING PULSEWIDTH: 0.4 ms

## 2018-05-25 ENCOUNTER — Other Ambulatory Visit: Payer: Self-pay | Admitting: Cardiovascular Disease

## 2018-06-22 ENCOUNTER — Other Ambulatory Visit: Payer: Self-pay | Admitting: "Endocrinology

## 2018-06-26 ENCOUNTER — Ambulatory Visit (INDEPENDENT_AMBULATORY_CARE_PROVIDER_SITE_OTHER): Payer: Medicare Other | Admitting: Cardiology

## 2018-06-26 ENCOUNTER — Encounter: Payer: Self-pay | Admitting: Cardiology

## 2018-06-26 VITALS — BP 120/60 | HR 80 | Ht 72.0 in | Wt 187.0 lb

## 2018-06-26 DIAGNOSIS — I255 Ischemic cardiomyopathy: Secondary | ICD-10-CM

## 2018-06-26 DIAGNOSIS — I442 Atrioventricular block, complete: Secondary | ICD-10-CM | POA: Diagnosis not present

## 2018-06-26 DIAGNOSIS — I5022 Chronic systolic (congestive) heart failure: Secondary | ICD-10-CM | POA: Diagnosis not present

## 2018-06-26 DIAGNOSIS — Z95810 Presence of automatic (implantable) cardiac defibrillator: Secondary | ICD-10-CM | POA: Diagnosis not present

## 2018-06-26 DIAGNOSIS — Z952 Presence of prosthetic heart valve: Secondary | ICD-10-CM | POA: Diagnosis not present

## 2018-06-26 DIAGNOSIS — I25810 Atherosclerosis of coronary artery bypass graft(s) without angina pectoris: Secondary | ICD-10-CM | POA: Diagnosis not present

## 2018-06-26 LAB — CUP PACEART INCLINIC DEVICE CHECK
Battery Remaining Longevity: 70 mo
Battery Voltage: 2.98 V
Brady Statistic AP VP Percent: 38.47 %
Brady Statistic RA Percent Paced: 38.17 %
Brady Statistic RV Percent Paced: 8.89 %
Date Time Interrogation Session: 20190925144311
HIGH POWER IMPEDANCE MEASURED VALUE: 62 Ohm
Implantable Lead Implant Date: 20171024
Implantable Lead Location: 753859
Implantable Lead Location: 753860
Implantable Lead Model: 4598
Implantable Lead Model: 5076
Lead Channel Impedance Value: 230.327
Lead Channel Impedance Value: 246.635
Lead Channel Impedance Value: 361 Ohm
Lead Channel Impedance Value: 475 Ohm
Lead Channel Impedance Value: 475 Ohm
Lead Channel Impedance Value: 551 Ohm
Lead Channel Impedance Value: 722 Ohm
Lead Channel Impedance Value: 779 Ohm
Lead Channel Impedance Value: 836 Ohm
Lead Channel Pacing Threshold Amplitude: 1 V
Lead Channel Pacing Threshold Pulse Width: 0.4 ms
Lead Channel Pacing Threshold Pulse Width: 0.4 ms
Lead Channel Sensing Intrinsic Amplitude: 0.25 mV
Lead Channel Setting Pacing Amplitude: 2 V
Lead Channel Setting Pacing Amplitude: 2.5 V
Lead Channel Setting Pacing Pulse Width: 0.4 ms
Lead Channel Setting Sensing Sensitivity: 0.3 mV
MDC IDC LEAD IMPLANT DT: 20171024
MDC IDC LEAD IMPLANT DT: 20171024
MDC IDC LEAD LOCATION: 753858
MDC IDC MSMT LEADCHNL LV IMPEDANCE VALUE: 222.34 Ohm
MDC IDC MSMT LEADCHNL LV IMPEDANCE VALUE: 222.34 Ohm
MDC IDC MSMT LEADCHNL LV IMPEDANCE VALUE: 246.635
MDC IDC MSMT LEADCHNL LV IMPEDANCE VALUE: 418 Ohm
MDC IDC MSMT LEADCHNL LV IMPEDANCE VALUE: 475 Ohm
MDC IDC MSMT LEADCHNL LV IMPEDANCE VALUE: 513 Ohm
MDC IDC MSMT LEADCHNL LV IMPEDANCE VALUE: 722 Ohm
MDC IDC MSMT LEADCHNL LV IMPEDANCE VALUE: 817 Ohm
MDC IDC MSMT LEADCHNL LV PACING THRESHOLD AMPLITUDE: 1.5 V
MDC IDC MSMT LEADCHNL LV PACING THRESHOLD PULSEWIDTH: 1 ms
MDC IDC MSMT LEADCHNL RA IMPEDANCE VALUE: 456 Ohm
MDC IDC MSMT LEADCHNL RV PACING THRESHOLD AMPLITUDE: 0.5 V
MDC IDC MSMT LEADCHNL RV SENSING INTR AMPL: 13 mV
MDC IDC PG IMPLANT DT: 20171024
MDC IDC SET LEADCHNL LV PACING PULSEWIDTH: 1 ms
MDC IDC SET LEADCHNL RV PACING AMPLITUDE: 2 V
MDC IDC STAT BRADY AP VS PERCENT: 0.72 %
MDC IDC STAT BRADY AS VP PERCENT: 59.6 %
MDC IDC STAT BRADY AS VS PERCENT: 1.22 %

## 2018-06-26 NOTE — Progress Notes (Signed)
Electrophysiology Office Note   Date:  06/26/2018   ID:  JAYSEN WEY, DOB 02-Aug-1946, MRN [id number]  PCP:  Sharilyn Sites, MD  Cardiologist:  Aundra Dubin Primary Electrophysiologist:  Tawona Filsinger Meredith [place], MD    No chief complaint on file.    History of Present Illness: Christian Bowman is a 72 y.o. male who presents today for electrophysiology evaluation.   History of CAD s/p CABG in 2007 then redo CABG in 10/17 with mitral valve replacement. He was admitted in 10/17 for redo CABG with SVG-PDA and SVG-ramus.  He also had mitral valve replacement with a bioprosthetic valve because of infarct-related mitral regurgitation.  His EF on last study (10/17 TEE) was 20-25%.   Post-operative course was complicated by CHF requiring diuresis.  He also had atrial flutter and required DCCV.  Due to complete heart block, he got a CRT-D system.    Had atrial flutter ablation 05/11/17 presenting for post procedure follow up.  Today, denies symptoms of palpitations, chest pain, shortness of breath, orthopnea, PND, lower extremity edema, claudication, dizziness, presyncope, syncope, bleeding, or neurologic sequela. The patient is tolerating medications without difficulties.  Overall he is feeling well.  He is having no chest pain or shortness of breath.  His device is functioning appropriately.   Past Medical History:  Diagnosis Date  . AICD (automatic cardioverter/defibrillator) present    medtronic-   DR. CROITORU , DR. Aundra Dubin   . Anginal pain ([place])    cp sat 08/11/17  . Anxiety   . CAD (coronary artery disease)   . CHF (congestive heart failure) ([place])   . Complication of anesthesia    took awhile to wake up   . Coronary artery disease involving coronary bypass graft   . Cyst of neck    right side  . DM2 (diabetes mellitus, type 2) ([place]) 08/26/2013  . Dyspnea   . Heart attack ([place])    "not sure when" (08/20/2017)  . HTN (hypertension) 08/26/2013  . Hyperlipidemia 08/26/2013  . Hypothyroidism   .  Left main coronary artery disease   . Left renal artery stenosis ([place])    Genesis 6x12 stent 2007  . Obesity (BMI 30.0-34.9) 08/26/2013  . Postoperative atrial fibrillation ([place]) 10/15/2005  . Presence of permanent cardiac pacemaker   . S/P CABG x 4 10/13/2005   LIMA to LAD, SVG to intermediate branch, sequential SVG to PDA and RPL branch, EVH via right thigh  . S/P mitral valve replacement with bioprosthetic valve 07/11/2016   31 mm [facility] Mitral bovine bioprosthetic tissue valve  . S/P redo CABG x 2 07/11/2016   SVG to PDA and SVG to Intermediate Branch, EVH via left thigh   Past Surgical History:  Procedure Laterality Date  . A-FLUTTER ABLATION N/A 05/11/2017   Procedure: A-Flutter Ablation;  Surgeon: Constance Haw, MD;  Location: [place] [facility];  Service: Cardiovascular;  Laterality: N/A;  . CARDIAC CATHETERIZATION N/A 06/21/2016   Procedure: Right/Left Heart Cath and Coronary/Graft Angiography;  Surgeon: Sherren Mocha, MD;  Location: [place] [facility];  Service: Cardiovascular;  Laterality: N/A;  . CARDIAC VALVE REPLACEMENT    . CARDIOVERSION N/A 07/19/2016   Procedure: CARDIOVERSION;  Surgeon: Lelon Perla, MD;  Location: [facility];  Service: Cardiovascular;  Laterality: N/A;  . CARDIOVERSION N/A 09/08/2016   Procedure: CARDIOVERSION;  Surgeon: Fay Records, MD;  Location: [place];  Service: Cardiovascular;  Laterality: N/A;  . CORONARY ANGIOPLASTY     STENT 2016  [place]     DES in SVG to right coronary artery system  . CORONARY ARTERY BYPASS GRAFT  10/13/2005   LIMA to LAD, SVG to intermediate branch, sequential SVG to PDA and RPL  . CORONARY ARTERY BYPASS GRAFT N/A 07/11/2016   Procedure: REDO CORONARY ARTERY BYPASS GRAFTING (CABG) x two using left leg greater saphenous vein harvested endoscopically-SVG to PDA -SVG to RAMUS INTERMEDIATE;  Surgeon: Rexene Alberts, MD;  Location: [place];   Service: Open Heart Surgery;  Laterality: N/A;  . CORONARY ARTERY BYPASS GRAFT N/A 07/11/2016   Procedure: Re-exploration (CABG) for post op bleeding,;  Surgeon: Rexene Alberts, MD;  Location: [place];  Service: Open Heart Surgery;  Laterality: N/A;  . EP IMPLANTABLE DEVICE N/A 07/25/2016   Procedure: BiV ICD Insertion CRT-D;  Surgeon: Hayze Gazda Meredith [place], MD;  Location: [place] [facility];  Service: Cardiovascular;  Laterality: N/A;  . INGUINAL HERNIA REPAIR Left 08/20/2017  . INGUINAL HERNIA REPAIR Left 08/20/2017   Procedure: OPEN LEFT INGUINAL HERNIA REPAIR;  Surgeon: Alphonsa Overall, MD;  Location: [place];  Service: General;  Laterality: Left;  . LEFT HEART CATH AND CORS/GRAFTS ANGIOGRAPHY N/A 12/18/2016   Procedure: Left Heart Cath and Cors/Grafts Angiography;  Surgeon: Sherren Mocha, MD;  Location: [place] [facility];  Service: Cardiovascular;  Laterality: N/A;  . LEFT HEART CATH AND CORS/GRAFTS ANGIOGRAPHY N/A 08/22/2017   Procedure: LEFT HEART CATH AND CORS/GRAFTS ANGIOGRAPHY;  Surgeon: Larey Dresser, MD;  Location: [place] [facility];  Service: Cardiovascular;  Laterality: N/A;  . MITRAL VALVE REPLACEMENT N/A 07/11/2016   Procedure: MITRAL VALVE (MV) REPLACEMENT;  Surgeon: Rexene Alberts, MD;  Location: [place];  Service: Open Heart Surgery;  Laterality: N/A;  . MYOCARDICAL PERFUSION  10/09/2007   NORMAL PERFUSION IN ALL REGIONS;NO EVIDENCE OF INDUCIBLE ISCHEMIA;POST STRESS EF% 66  . RENAL ARTERY STENT Right 2007  . RENAL DOPPLER  03/28/2010   RIGHT RA-NORMAL;LEFT PROXIMAL RA AT STENT-PATENT WITH NO EVIDENCE OF SIGN DIAMETER REDUCTION. R & L KIDNEYS: EQUAL IN SIZE,SYMMETRICAL IN SHAPE.  . TEE WITHOUT CARDIOVERSION N/A 06/15/2016   Procedure: TRANSESOPHAGEAL ECHOCARDIOGRAM (TEE);  Surgeon: Sanda Klein, MD;  Location: [facility] ENDOSCOPY;  Service: Cardiovascular;  Laterality: N/A;  . TEE WITHOUT CARDIOVERSION N/A 07/11/2016   Procedure: TRANSESOPHAGEAL ECHOCARDIOGRAM (TEE);  Surgeon: Rexene Alberts, MD;  Location: [place];  Service: Open Heart Surgery;  Laterality: N/A;  . TEE WITHOUT CARDIOVERSION N/A 07/19/2016   Procedure: TRANSESOPHAGEAL ECHOCARDIOGRAM (TEE);  Surgeon: Lelon Perla, MD;  Location: [facility] ENDOSCOPY;  Service: Cardiovascular;  Laterality: N/A;  . TRANSESOPHAGEAL ECHOCARDIOGRAM  10/19/2005   NORMAL LV; MILD TO MODERATE AMOUNT OF SOFT ATHEROMATOUS PLAQUE OF THE THORACIC AORTA; THE LEFT ATRIUM IS MILDLY DILATED;LEFT ATRIAL APPENDAGE FUNCTION IS NORMAL;NO THROMBUS IDENTIFIED. SMALL PFO WITH RIGHT TO LEFT SHUNT     Current Outpatient Medications  Medication Sig Dispense Refill  . ACCU-CHEK SOFTCLIX LANCETS lancets Use as instructed. E11.65 100 each 2  . amoxicillin (AMOXIL) 500 MG tablet Take 4 tablets (2,000 mg total) by mouth as needed. Pre-dental work only. 4 tablet 2  . aspirin EC 81 MG tablet Take 81 mg by mouth daily.     . carvedilol (COREG) 12.5 MG tablet Take 1 tablet (12.5 mg total) by mouth 2 (two) times daily with a meal. 60 tablet 3  . furosemide (LASIX) 20 MG tablet Take 1 tablet (20 mg total) by mouth 2 (two) times  a week. 15 tablet 6  . levothyroxine (SYNTHROID, LEVOTHROID) 75 MCG tablet Take 1 tablet (75 mcg total) by mouth daily before breakfast. 90 tablet 1  . neomycin-bacitracin-polymyxin (NEOSPORIN) ointment Apply 1 application topically daily as needed for wound care.    . nitroGLYCERIN (NITROSTAT) 0.4 MG SL tablet DISSOLVE ONE TABLET UNDER THE TONGUE EVERY 5 MINUTES AS NEEDED FOR CHEST PAIN.  DO NOT EXCEED A TOTAL OF 3 DOSES IN 15 MINUTES 25 tablet 15  . polyethylene glycol (MIRALAX / GLYCOLAX) packet Take 17 g by mouth daily as needed for mild constipation or moderate constipation.    . rosuvastatin (CRESTOR) 10 MG tablet Take 10 mgs by mouth once daily in the evening 90 tablet 1  . sacubitril-valsartan (ENTRESTO) 49-51 MG Take 1 tablet by mouth 2 (two) times daily. 180 tablet 3  . spironolactone (ALDACTONE) 25 MG tablet Take 1 tablet (25 mg total)  by mouth at bedtime. 30 tablet 6  . TRADJENTA 5 MG TABS tablet TAKE 1 TABLET BY MOUTH ONCE DAILY 90 tablet 2   No current facility-administered medications for this visit.     Allergies:   Xanax [alprazolam]   Social History:  The patient  reports that he has never smoked. He has never used smokeless tobacco. He reports that he does not drink alcohol or use drugs.   Family History:  The patient's family history includes Heart attack in his father and mother; Heart disease in his father, maternal grandmother, and mother; Hypertension in his father and sister.    ROS:  Please see the history of present illness.   Otherwise, review of systems is positive for chest pain, cough, shortness of breath, constipation, back pain, dizziness.   All other systems are reviewed and negative.   PHYSICAL EXAM: VS:  BP 120/60   Pulse 80   Ht 6' (1.829 m)   Wt 187 lb (84.8 kg)   BMI 25.36 kg/m  , BMI Body mass index is 25.36 kg/m. GEN: Well nourished, well developed, in no acute distress  HEENT: normal  Neck: no JVD, carotid bruits, or masses Cardiac: RRR; no murmurs, rubs, or gallops,no edema  Respiratory:  clear to auscultation bilaterally, normal work of breathing GI: soft, nontender, nondistended, + BS MS: no deformity or atrophy  Skin: warm and dry, device site well healed Neuro:  Strength and sensation are intact Psych: euthymic mood, full affect  EKG:  EKG is ordered today. Personal review of the ekg ordered shows AV paced  Personal review of the device interrogation today. Results in [place]: 08/23/2017: Hemoglobin 11.8; Platelets 120 03/04/2018: ALT 17; TSH 2.240 03/12/2018: BUN 39; Creatinine, Ser 1.88; Potassium 5.1; Sodium 137    Lipid Panel     Component Value Date/Time   CHOL 130 03/04/2018 0949   TRIG 139 03/04/2018 0949   HDL 32 (L) 03/04/2018 0949   CHOLHDL 4.1 03/04/2018 0949   CHOLHDL 4.1 02/19/2018 1018   VLDL 32 02/19/2018 1018   LDLCALC 70  03/04/2018 0949     Wt Readings from Last 3 Encounters:  06/26/18 187 lb (84.8 kg)  04/10/18 185 lb 9.6 oz (84.2 kg)  04/03/18 185 lb (83.9 kg)      Other studies Reviewed: Additional studies/ records that were reviewed today include: TEE 07/19/16  Review of the above records today demonstrates:  - Left ventricle: The cavity size was severely dilated. Systolic   function was severely reduced. The estimated ejection fraction   was  in the range of 20% to 25%. Diffuse hypokinesis. - Aortic valve: No evidence of vegetation. - Mitral valve: A bioprosthesis was present. - Left atrium: The atrium was moderately dilated. No evidence of   thrombus in the atrial cavity or appendage. - Right ventricle: Systolic function was moderately reduced. - Right atrium: No evidence of thrombus in the atrial cavity or   appendage. - Atrial septum: No defect or patent foramen ovale was identified. - Tricuspid valve: No evidence of vegetation. There was   mild-moderate regurgitation. - Pulmonic valve: No evidence of vegetation.  Cath 06/20/17 1. Severe native three-vessel coronary artery disease with total occlusion of the LAD, total occlusion of the ramus intermedius, and total occlusion of the RCA 2. Continued patency of the LIMA to LAD with total occlusion of the apical LAD appearance unchanged from previous cath study 3. Patency of the most recent saphenous vein graft to PDA without significant stenosis present 4. Interval occlusion of the old saphenous vein graft PDA 5. Nonvisualization of the saphenous vein graft to diagonal, suspect total occlusion from nonselective imaging 6. Low LVEDP  ASSESSMENT AND PLAN:  1.  Atrial flutter: Off of anticoagulation status post ablation.  No changes. This patients CHA2DS2-VASc Score and unadjusted Ischemic Stroke Rate (% per year) is equal to 4.8 % stroke rate/year from a score of 4  Above score calculated as 1 point each if present [CHF, HTN, DM,  Vascular=MI/PAD/Aortic Plaque, Age if 65-74, or Male] Above score calculated as 2 points each if present [Age > 75, or Stroke/TIA/TE]   2. Complete AV block: Status post Medtronic CRT-D.  P waves have decreased, but are sensing appropriately.  No changes.  3. Chronic systolic heart failure: On optimal medical therapy.  No volume overload at this time.  4. Mitral regurgitation: Status post redo MVR  5. CAD: s/p redo CABG. this post CABG, stable angina.  No changes.     Current medicines are reviewed at length with the patient today.   The patient does not have concerns regarding his medicines.  The following changes were made today: None  Labs/ tests ordered today include:  Orders Placed This Encounter  Procedures  . EKG 12-Lead     Disposition:   FU with Subrina Vecchiarelli 12 months  Signed, Yajaira Doffing Meredith [place], MD  06/26/2018 10:38 AM     [facility] [address] [place] [address] [phone] (office) [phone] (fax)

## 2018-06-26 NOTE — Patient Instructions (Signed)
Medication Instructions:  Your physician recommends that you continue on your current medications as directed. Please refer to the Current Medication list given to you today.  *If you need a refill on your cardiac medications before your next appointment, please call your pharmacy*  Labwork: None ordered  Testing/Procedures: None ordered  Follow-Up: Remote monitoring is used to monitor your Pacemaker or ICD from home. This monitoring reduces the number of office visits required to check your device to one time per year. It allows [place] to keep an eye on the functioning of your device to ensure it is working properly. You are scheduled for a device check from home on 07/30/2018. You may send your transmission at any time that day. If you have a wireless device, the transmission will be sent automatically. After your physician reviews your transmission, you will receive a postcard with your next transmission date.  Your physician wants you to follow-up in: 1 year with Dr. Curt Bears.  You will receive a reminder letter in the mail two months in advance. If you don't receive a letter, please call our office to schedule the follow-up appointment.  Thank you for choosing [facility]!!   Trinidad Curet, RN [phone]  Any Other Special Instructions Will Be Listed Below (If Applicable).

## 2018-07-02 ENCOUNTER — Other Ambulatory Visit (HOSPITAL_COMMUNITY): Payer: Self-pay | Admitting: Cardiology

## 2018-07-05 ENCOUNTER — Encounter (HOSPITAL_COMMUNITY): Payer: Self-pay | Admitting: Cardiology

## 2018-07-05 ENCOUNTER — Ambulatory Visit (HOSPITAL_COMMUNITY)
Admission: RE | Admit: 2018-07-05 | Discharge: 2018-07-05 | Disposition: A | Discharge status: Home / Self Care | Payer: Medicare Other | Source: Ambulatory Visit | Attending: Cardiology | Admitting: Cardiology

## 2018-07-05 VITALS — BP 125/62 | HR 82 | Wt 186.2 lb

## 2018-07-05 DIAGNOSIS — E039 Hypothyroidism, unspecified: Secondary | ICD-10-CM | POA: Insufficient documentation

## 2018-07-05 DIAGNOSIS — I5022 Chronic systolic (congestive) heart failure: Secondary | ICD-10-CM | POA: Insufficient documentation

## 2018-07-05 DIAGNOSIS — Z7982 Long term (current) use of aspirin: Secondary | ICD-10-CM | POA: Insufficient documentation

## 2018-07-05 DIAGNOSIS — Z79890 Hormone replacement therapy (postmenopausal): Secondary | ICD-10-CM | POA: Insufficient documentation

## 2018-07-05 DIAGNOSIS — I252 Old myocardial infarction: Secondary | ICD-10-CM | POA: Diagnosis not present

## 2018-07-05 DIAGNOSIS — I255 Ischemic cardiomyopathy: Secondary | ICD-10-CM | POA: Diagnosis not present

## 2018-07-05 DIAGNOSIS — I4892 Unspecified atrial flutter: Secondary | ICD-10-CM | POA: Diagnosis not present

## 2018-07-05 DIAGNOSIS — N183 Chronic kidney disease, stage 3 (moderate): Secondary | ICD-10-CM | POA: Diagnosis not present

## 2018-07-05 DIAGNOSIS — I25708 Atherosclerosis of coronary artery bypass graft(s), unspecified, with other forms of angina pectoris: Secondary | ICD-10-CM

## 2018-07-05 DIAGNOSIS — R0789 Other chest pain: Secondary | ICD-10-CM | POA: Insufficient documentation

## 2018-07-05 DIAGNOSIS — Z953 Presence of xenogenic heart valve: Secondary | ICD-10-CM | POA: Insufficient documentation

## 2018-07-05 DIAGNOSIS — E1122 Type 2 diabetes mellitus with diabetic chronic kidney disease: Secondary | ICD-10-CM | POA: Diagnosis not present

## 2018-07-05 DIAGNOSIS — E785 Hyperlipidemia, unspecified: Secondary | ICD-10-CM | POA: Insufficient documentation

## 2018-07-05 DIAGNOSIS — I2510 Atherosclerotic heart disease of native coronary artery without angina pectoris: Secondary | ICD-10-CM | POA: Insufficient documentation

## 2018-07-05 DIAGNOSIS — Z951 Presence of aortocoronary bypass graft: Secondary | ICD-10-CM | POA: Diagnosis not present

## 2018-07-05 DIAGNOSIS — Z79899 Other long term (current) drug therapy: Secondary | ICD-10-CM | POA: Diagnosis not present

## 2018-07-05 DIAGNOSIS — Z8249 Family history of ischemic heart disease and other diseases of the circulatory system: Secondary | ICD-10-CM | POA: Diagnosis not present

## 2018-07-05 LAB — BASIC METABOLIC PANEL
Anion gap: 5 (ref 5–15)
BUN: 34 mg/dL — ABNORMAL HIGH (ref 8–23)
CHLORIDE: 106 mmol/L (ref 98–111)
CO2: 25 mmol/L (ref 22–32)
CREATININE: 1.78 mg/dL — AB (ref 0.61–1.24)
Calcium: 9.3 mg/dL (ref 8.9–10.3)
GFR calc Af Amer: 43 mL/min — ABNORMAL LOW (ref >60)
GFR calc non Af Amer: 37 mL/min — ABNORMAL LOW (ref >60)
GLUCOSE: 107 mg/dL — AB (ref 70–99)
Potassium: 5.6 mmol/L — ABNORMAL HIGH (ref 3.5–5.1)
Sodium: 136 mmol/L (ref 135–145)

## 2018-07-05 MED ORDER — FUROSEMIDE 20 MG PO TABS: 20.0000 mg | ORAL_TABLET | ORAL | 6 refills | Status: DC | PRN

## 2018-07-05 NOTE — Patient Instructions (Signed)
Today you have been seen at the Heart failure clinic at [facility]   Medication changes: Stop daily Lasix use PRN   Lab work done today: BMET  Follow up with Dr. Aundra Dubin in 6  months. We will call you with a reminder about your appointment by February if you do not hear from [place] call the office to schedule your appointment.   You have the following test scheduled for:  Echo in 6 months

## 2018-07-07 NOTE — Progress Notes (Signed)
PCP: Dr Hilma Favors Cardiology: Dr. Sallyanne Kuster HF Cardiology: Dr. Aundra Dubin  72 y.o. with history of CAD s/p CABG in 2007 then redo CABG in 10/17 with mitral valve replacement presents for cardiology followup.  He was admitted in 10/17 for redo CABG with SVG-PDA and SVG-ramus.  He also had mitral valve replacement with a bioprosthetic valve because of infarct-related mitral regurgitation.  His EF on last study (10/17 TEE) was 20-25%.   Post-operative course was complicated by CHF requiring diuresis.  He also had atrial flutter and required DCCV.  Due to complete heart block, he later got a CRT-D system.    At a prior visit, he was in atrial flutter.  He saw Dr. Curt Bears, it was decided to arrange for DCCV with ablation down the road when PPM leads have been in longer.  He had successful DCCV in 12/17 and is in NSR today.   He was admitted in 3/18 with NSTEMI and chest pain. TnI only 0.5.  LHC showed occlusion of SVG-PDA from CABG#1 to be the likely culprit.  However, SVG-PDA from CABG#2 was patent.  No intervention.  Echo in 3/18 from [place] showed EF 30-35%, stable bioprosthetic mitral valve.   He had atrial flutter ablation in 8/18.  He is in NSR today and is off amiodarone.    In 11/18, he had an inguinal hernia repair. Post-op, he had an NSTEMI.  LHC showed occlusion of a PLV branch that had been backfilled by SVG-PDA (prior cath had shown severe diffuse disease in the PLV).  Echo in 11/18 showed EF 20-25% with normal bioprosthetic mitral valve.    With no recurrence of atrial arrhythmias, he is now off anticoagulation.   Patient returns for followup of CHF today.  He continues to do cardiac rehab in [place].  He reports dyspnea only if he walks fast for a moderate distance.  No orthopnea/PND. He has been more lightheaded with standing over the last few weeks, especially on hot days.  BP is stable today.  He has not noted lightheadedness with exercise.  No exertional chest pain.  He continues to have  rare episodes of atypical chest pain.  He has used about 20 NTGs in 6 months. Most recent episode was earlier this week, he developed mild chest pain while lying in bed and took an NTG. Weight is stable.   Medtronic device interrogation: >99% BiV pacing, fluid index < threshold with stable thoracic impedance.  No VT.   Labs (11/17): K 4.8, creatinine 1.89, hgb 12.3, digoxin 0.9, LFTs normal, TSH 8.235 (mild increase), free T3 low, free T4 normal, LFTs normal.  Labs (12/17): K 4.5, creatinine 1.7, BNP 3909 Labs (3/18): K 3.9, creatinine 1.48, hgb 11.3 Labs (4/18): digoxin 0.5, LFTs normal Labs (7/18): K 5.1, creatinine 1.78, LDL 63, HDL 29, TSH elevated Labs (11/18): K 4.5, creatinine 1.86, hgb 13.6 Labs (12/18): K 5, creatinine 1.88 Labs (1/19): digoxin 0.3, K 4.7, creatinine 1.83 Labs (6/19): LDL 70, HDL 32, K 5.1, creatinine 1.88  PMH: 1. CAD: CABG 2007.  - LHC (9/17) with patent LIMA-LAD, totally occluded SVG-D, severe stenosis in SVG-PDA.  - Redo CABG 10/17 with SVG-PDA, SVG-ramus and mitral valve replacement.  - NSTEMI 3/18.  LHC with culprit lesion likely occlusion of SVG-PDA from CABG#1.  Patent SVG-PDA from CABG#2.  No intervention.  - NSTEMI 11/18 post-op inguinal hernia repair.  LHC with occlusion of a PLV branch that had been backfilled by SVG-PDA (prior cath had shown severe diffuse disease in the  PLV).  2. Mitral regurgitation: Ischemic MR, mitral valve replacement was done in 10/17 (bioprosthetic). 3. Complete heart block: Post-op in 10/17.  Medtronic CRT-D placed.  4. Atrial flutter: DCCV 10/17 and again in 12/17.  - Ablation 8/18, now off amiodarone.  5. Type II diabetes 6. Hyperlipidemia 7. Chronic systolic CHF: Ischemic cardiomyopathy.   - TEE (10/17): EF 20-25%, severe LV dilation - Echo (3/18, [place]): EF 30-35%, bioprosthetic mitral valve with mean gradient 5 mmHg.  - Echo (11/18): EF 20-25%, moderate LV dilation, normal bioprosthetic mitral valve.  8.  Hypothyroidism  SH: Married, retired, lives in [place], nonsmoker, no ETOH.   Family History  Problem Relation Age of Onset  . Heart attack Mother   . Heart disease Mother   . Heart attack Father   . Heart disease Father   . Hypertension Father   . Hypertension Sister   . Heart disease Maternal Grandmother    ROS: All systems reviewed and negative except as per HPI.   Current Outpatient Medications  Medication Sig Dispense Refill  . ACCU-CHEK SOFTCLIX LANCETS lancets Use as instructed. E11.65 100 each 2  . amoxicillin (AMOXIL) 500 MG tablet Take 4 tablets (2,000 mg total) by mouth as needed. Pre-dental work only. 4 tablet 2  . aspirin EC 81 MG tablet Take 81 mg by mouth daily.     . carvedilol (COREG) 12.5 MG tablet TAKE 1 TABLET BY MOUTH TWICE DAILY WITH A MEAL 60 tablet 5  . furosemide (LASIX) 20 MG tablet Take 1 tablet (20 mg total) by mouth as needed. 15 tablet 6  . isosorbide mononitrate (IMDUR) 30 MG 24 hr tablet Take 30 mg by mouth as needed.    [name] levothyroxine (SYNTHROID, LEVOTHROID) 75 MCG tablet Take 1 tablet (75 mcg total) by mouth daily before breakfast. 90 tablet 1  . neomycin-bacitracin-polymyxin (NEOSPORIN) ointment Apply 1 application topically daily as needed for wound care.    . nitroGLYCERIN (NITROSTAT) 0.4 MG SL tablet DISSOLVE ONE TABLET UNDER THE TONGUE EVERY 5 MINUTES AS NEEDED FOR CHEST PAIN.  DO NOT EXCEED A TOTAL OF 3 DOSES IN 15 MINUTES 25 tablet 15  . polyethylene glycol (MIRALAX / GLYCOLAX) packet Take 17 g by mouth daily as needed for mild constipation or moderate constipation.    . rosuvastatin (CRESTOR) 10 MG tablet Take 10 mgs by mouth once daily in the evening 90 tablet 1  . sacubitril-valsartan (ENTRESTO) 49-51 MG Take 1 tablet by mouth 2 (two) times daily. 180 tablet 3  . spironolactone (ALDACTONE) 25 MG tablet Take 1 tablet (25 mg total) by mouth at bedtime. 30 tablet 6  . TRADJENTA 5 MG TABS tablet TAKE 1 TABLET BY MOUTH ONCE DAILY 90 tablet 2    No current facility-administered medications for this encounter.    BP 125/62   Pulse 82   Wt 84.5 kg (186 lb 4 oz)   SpO2 100%   BMI 25.26 kg/m  General: NAD Neck: No JVD, no thyromegaly or thyroid nodule.  Lungs: Clear to auscultation bilaterally with normal respiratory effort. CV: Nondisplaced PMI.  Heart regular S1/S2, no S3/S4, no murmur.  No peripheral edema.  No carotid bruit.  Normal pedal pulses.  Abdomen: Soft, nontender, no hepatosplenomegaly, no distention.  Skin: Intact without lesions or rashes.  Neurologic: Alert and oriented x 3.  Psych: Normal affect. Extremities: No clubbing or cyanosis.  HEENT: Normal.    Assessment/Plan: 1. Chronic systolic CHF: Ischemic cardiomyopathy.  TEE 10/17 with EF 20-25%.  Has Medtronic  CRT-D system. Most recent echo from 11/18 with EF 20-25%.  On exam and by Optivol, he is not volume overloaded.  NYHA class II.  He does note more orthostatic symptoms recently, especially on hot days.  - He is taking Lasix twice a week.  I will have him stop Lasix and just take it prn.    - Continue Entresto 49/51 bid.  Would not increase with orthostatic-type symptoms.  - Continue spironolactone to 25 mg daily.   - Continue Coreg 12.5 mg bid.  Would not increase with orthostatic-type symptoms.  - BMET today.  - Repeat echo at next appt with me.  2. CAD: s/p redo CABG.  Admission in 3/18 with NSTEMI, LHC showed occlusion of SVG-PDA from CABG#1 but patent SVG-PDA from CABG#2, no intervention.  He had a post-op NSTEMI after inguinal hernia repair in 11/18.  LHC showed occlusion of a PLV branch that had been backfilled by SVG-PDA (prior cath had shown severe diffuse disease in the PLV). Currently, has rare atypical chest pain with no change to pattern.    - Continue statin => Crestor 10 mg daily. Good lipids 6/19.  - Continue cardiac rehab maintenance.   - Continue ASA 81. - Avoid future elective surgery.  3. Bioprosthetic mitral valve: Stable on 11/18  echo. 4. Atrial flutter: s/p ablation in 8/18.  He is in NSR and off amiodarone.  - Now off warfarin with flutter ablation and no recurrence. If he has recurrence of atrial arrhythmias, based on most recent data DOAC would be a reasonable choice for him even with bioprosthetic MV.  5. CKD: Stage III.  BMET today.   6. Type II diabetes: He would be a good candidate for an SGLT-2 inhibitor.   He will see Dr. Sallyanne Kuster in 3 months and I will see him back in 6 months with an echo.     Loralie Champagne 07/07/2018

## 2018-07-09 ENCOUNTER — Telehealth: Payer: Self-pay | Admitting: Cardiovascular Disease

## 2018-07-09 ENCOUNTER — Other Ambulatory Visit: Payer: Self-pay

## 2018-07-09 DIAGNOSIS — Z79899 Other long term (current) drug therapy: Secondary | ICD-10-CM

## 2018-07-09 DIAGNOSIS — I5022 Chronic systolic (congestive) heart failure: Secondary | ICD-10-CM

## 2018-07-09 DIAGNOSIS — E875 Hyperkalemia: Secondary | ICD-10-CM

## 2018-07-09 MED ORDER — SODIUM ZIRCONIUM CYCLOSILICATE 10 G PO PACK: 10.0000 g | PACK | Freq: Every day | ORAL | 0 refills | Status: DC

## 2018-07-09 NOTE — Telephone Encounter (Signed)
° °  Spouse calling to request lab order be mailed to home address,  get labs done closer to home. Address verified.

## 2018-07-09 NOTE — Telephone Encounter (Signed)
Pt wife advised Dr. Claris Gladden recommendations re: his elevated K from 07/05/18.Milly Jakob called in to the pt pharmacy and order for [place] in [place] for Bmet in one week.

## 2018-07-10 ENCOUNTER — Telehealth (HOSPITAL_COMMUNITY): Payer: Self-pay | Admitting: Cardiology

## 2018-07-10 MED ORDER — SODIUM ZIRCONIUM CYCLOSILICATE 10 G PO PACK: 10.0000 g | PACK | Freq: Every day | ORAL | 0 refills | Status: DC

## 2018-07-10 NOTE — Telephone Encounter (Signed)
Notes recorded by Kerry Dory, CMA on 07/10/2018 at 11:04 AM EDT Patient aware via caregiver, Rosann Auerbach. Will have labs repeated at [place] faxed to [phone] [facility] updated   ------  Notes recorded by Larey Dresser, MD on 07/05/2018 at 4:10 PM EDT K running even higher. To keep him on his current meds, think he needs to start on [place]. Repeat BMET in 1 week on [place], can do in [place] and fax to use.

## 2018-07-10 NOTE — Telephone Encounter (Signed)
-----   Message from Larey Dresser, MD sent at 07/05/2018  4:10 PM EDT ----- K running even higher.  To keep him on his current meds, think he needs to start on [place].  Repeat BMET in 1 week on [place], can do in [place] and fax to use.

## 2018-07-17 DIAGNOSIS — Z23 Encounter for immunization: Secondary | ICD-10-CM | POA: Diagnosis not present

## 2018-07-17 DIAGNOSIS — Z79899 Other long term (current) drug therapy: Secondary | ICD-10-CM | POA: Diagnosis not present

## 2018-07-17 DIAGNOSIS — I5022 Chronic systolic (congestive) heart failure: Secondary | ICD-10-CM | POA: Diagnosis not present

## 2018-07-17 DIAGNOSIS — E875 Hyperkalemia: Secondary | ICD-10-CM | POA: Diagnosis not present

## 2018-07-18 LAB — BASIC METABOLIC PANEL
BUN/Creatinine Ratio: 18 (ref 10–24)
BUN: 31 mg/dL — ABNORMAL HIGH (ref 8–27)
CALCIUM: 9.2 mg/dL (ref 8.6–10.2)
CO2: 22 mmol/L (ref 20–29)
CREATININE: 1.77 mg/dL — AB (ref 0.76–1.27)
Chloride: 102 mmol/L (ref 96–106)
GFR calc Af Amer: 44 mL/min/{1.73_m2} — ABNORMAL LOW (ref >59)
GFR calc non Af Amer: 38 mL/min/{1.73_m2} — ABNORMAL LOW (ref >59)
GLUCOSE: 87 mg/dL (ref 65–99)
POTASSIUM: 4.9 mmol/L (ref 3.5–5.2)
SODIUM: 141 mmol/L (ref 134–144)

## 2018-07-19 ENCOUNTER — Encounter (HOSPITAL_COMMUNITY): Payer: Self-pay

## 2018-07-22 ENCOUNTER — Telehealth (HOSPITAL_COMMUNITY): Payer: Self-pay

## 2018-07-22 DIAGNOSIS — I5022 Chronic systolic (congestive) heart failure: Secondary | ICD-10-CM

## 2018-07-22 NOTE — Telephone Encounter (Signed)
Lab orders put in.

## 2018-07-30 ENCOUNTER — Ambulatory Visit (INDEPENDENT_AMBULATORY_CARE_PROVIDER_SITE_OTHER): Payer: Medicare Other | Admitting: *Deleted

## 2018-07-30 DIAGNOSIS — I255 Ischemic cardiomyopathy: Secondary | ICD-10-CM

## 2018-07-30 NOTE — Progress Notes (Signed)
Remote ICD transmission.   

## 2018-07-31 ENCOUNTER — Other Ambulatory Visit: Payer: Self-pay | Admitting: Cardiology

## 2018-07-31 DIAGNOSIS — I5022 Chronic systolic (congestive) heart failure: Secondary | ICD-10-CM | POA: Diagnosis not present

## 2018-08-01 LAB — BASIC METABOLIC PANEL
BUN/Creatinine Ratio: 13 (ref 10–24)
BUN: 22 mg/dL (ref 8–27)
CALCIUM: 9 mg/dL (ref 8.6–10.2)
CHLORIDE: 104 mmol/L (ref 96–106)
CO2: 23 mmol/L (ref 20–29)
CREATININE: 1.72 mg/dL — AB (ref 0.76–1.27)
GFR calc Af Amer: 46 mL/min/{1.73_m2} — ABNORMAL LOW (ref >59)
GFR calc non Af Amer: 39 mL/min/{1.73_m2} — ABNORMAL LOW (ref >59)
GLUCOSE: 189 mg/dL — AB (ref 65–99)
Potassium: 4.7 mmol/L (ref 3.5–5.2)
Sodium: 140 mmol/L (ref 134–144)

## 2018-08-01 LAB — SPECIMEN STATUS REPORT

## 2018-08-05 ENCOUNTER — Encounter (HOSPITAL_COMMUNITY): Payer: Self-pay

## 2018-08-07 ENCOUNTER — Other Ambulatory Visit (HOSPITAL_COMMUNITY): Payer: Self-pay | Admitting: Cardiology

## 2018-08-07 ENCOUNTER — Encounter (HOSPITAL_COMMUNITY): Payer: Self-pay

## 2018-08-28 DIAGNOSIS — E663 Overweight: Secondary | ICD-10-CM | POA: Diagnosis not present

## 2018-08-28 DIAGNOSIS — E1165 Type 2 diabetes mellitus with hyperglycemia: Secondary | ICD-10-CM | POA: Diagnosis not present

## 2018-08-28 DIAGNOSIS — Z0000 Encounter for general adult medical examination without abnormal findings: Secondary | ICD-10-CM | POA: Diagnosis not present

## 2018-08-28 DIAGNOSIS — I2510 Atherosclerotic heart disease of native coronary artery without angina pectoris: Secondary | ICD-10-CM | POA: Diagnosis not present

## 2018-08-28 DIAGNOSIS — E782 Mixed hyperlipidemia: Secondary | ICD-10-CM | POA: Diagnosis not present

## 2018-08-28 DIAGNOSIS — Z6826 Body mass index (BMI) 26.0-26.9, adult: Secondary | ICD-10-CM | POA: Diagnosis not present

## 2018-08-28 DIAGNOSIS — I509 Heart failure, unspecified: Secondary | ICD-10-CM | POA: Diagnosis not present

## 2018-08-28 DIAGNOSIS — E876 Hypokalemia: Secondary | ICD-10-CM | POA: Diagnosis not present

## 2018-09-11 ENCOUNTER — Other Ambulatory Visit (HOSPITAL_COMMUNITY): Payer: Self-pay | Admitting: Cardiology

## 2018-09-14 ENCOUNTER — Other Ambulatory Visit: Payer: Self-pay | Admitting: "Endocrinology

## 2018-09-19 ENCOUNTER — Encounter (HOSPITAL_COMMUNITY): Payer: Self-pay

## 2018-09-20 ENCOUNTER — Other Ambulatory Visit (HOSPITAL_COMMUNITY): Payer: Self-pay | Admitting: *Deleted

## 2018-09-20 MED ORDER — SACUBITRIL-VALSARTAN 49-51 MG PO TABS: 1.0000 | ORAL_TABLET | Freq: Two times a day (BID) | ORAL | 3 refills | Status: DC

## 2018-09-23 ENCOUNTER — Telehealth: Payer: Self-pay | Admitting: Cardiovascular Disease

## 2018-09-23 DIAGNOSIS — E875 Hyperkalemia: Secondary | ICD-10-CM

## 2018-09-23 DIAGNOSIS — I5022 Chronic systolic (congestive) heart failure: Secondary | ICD-10-CM

## 2018-09-23 DIAGNOSIS — E785 Hyperlipidemia, unspecified: Secondary | ICD-10-CM

## 2018-09-23 DIAGNOSIS — Z79899 Other long term (current) drug therapy: Secondary | ICD-10-CM

## 2018-09-23 NOTE — Telephone Encounter (Signed)
New Message    Pt has an appt on Jan. 8th with Dr Sallyanne Kuster. Wife wants to know if pt can have Potassium checked prior to appointment. Please call

## 2018-09-23 NOTE — Telephone Encounter (Signed)
Returned call to wife (ok per DPR)-patient has appt with Dr. Sallyanne Kuster on 1/8 but he is having labs drawn with PCP on 12/27 or 12/30 and wife was wondering if the labs Dr. Sallyanne Kuster needed could be added on to this.   PCP will be checking A1C, TSH, T4, VitD.   She is requesting his potassium be checked as well.     She is requesting the orders be faxed to [phone].     Advised would route to Dr. Sallyanne Kuster to see what labs to be ordered.

## 2018-09-27 NOTE — Telephone Encounter (Signed)
Labs ordered and faxed to Dr. Jeral Pinch made aware

## 2018-09-27 NOTE — Telephone Encounter (Signed)
Please add fasting lipid and cmet  [facility]

## 2018-09-27 NOTE — Addendum Note (Signed)
Addended by: Patria Mane A on: 09/27/2018 01:29 PM   Modules accepted: Orders

## 2018-09-29 LAB — CUP PACEART REMOTE DEVICE CHECK
Battery Remaining Longevity: 69 mo
Brady Statistic AP VP Percent: 41.8 %
Brady Statistic AP VS Percent: 0.8 %
Brady Statistic AS VP Percent: 55.95 %
Brady Statistic AS VS Percent: 1.45 %
Brady Statistic RV Percent Paced: 13.5 %
Date Time Interrogation Session: 20191029121831
HIGH POWER IMPEDANCE MEASURED VALUE: 61 Ohm
Implantable Lead Implant Date: 20171024
Implantable Lead Implant Date: 20171024
Implantable Lead Location: 753858
Implantable Lead Model: 4598
Implantable Pulse Generator Implant Date: 20171024
Lead Channel Impedance Value: 201.488
Lead Channel Impedance Value: 201.488
Lead Channel Impedance Value: 201.488
Lead Channel Impedance Value: 228 Ohm
Lead Channel Impedance Value: 342 Ohm
Lead Channel Impedance Value: 399 Ohm
Lead Channel Impedance Value: 456 Ohm
Lead Channel Impedance Value: 703 Ohm
Lead Channel Impedance Value: 722 Ohm
Lead Channel Impedance Value: 760 Ohm
Lead Channel Impedance Value: 817 Ohm
Lead Channel Pacing Threshold Amplitude: 1.125 V
Lead Channel Pacing Threshold Pulse Width: 0.4 ms
Lead Channel Pacing Threshold Pulse Width: 0.4 ms
Lead Channel Sensing Intrinsic Amplitude: 0.625 mV
Lead Channel Sensing Intrinsic Amplitude: 9.875 mV
Lead Channel Setting Pacing Amplitude: 2.25 V
Lead Channel Setting Pacing Pulse Width: 1 ms
MDC IDC LEAD IMPLANT DT: 20171024
MDC IDC LEAD LOCATION: 753859
MDC IDC LEAD LOCATION: 753860
MDC IDC MSMT BATTERY VOLTAGE: 2.97 V
MDC IDC MSMT LEADCHNL LV IMPEDANCE VALUE: 228 Ohm
MDC IDC MSMT LEADCHNL LV IMPEDANCE VALUE: 361 Ohm
MDC IDC MSMT LEADCHNL LV IMPEDANCE VALUE: 456 Ohm
MDC IDC MSMT LEADCHNL LV IMPEDANCE VALUE: 456 Ohm
MDC IDC MSMT LEADCHNL LV IMPEDANCE VALUE: 532 Ohm
MDC IDC MSMT LEADCHNL LV IMPEDANCE VALUE: 779 Ohm
MDC IDC MSMT LEADCHNL LV PACING THRESHOLD PULSEWIDTH: 1 ms
MDC IDC MSMT LEADCHNL RA PACING THRESHOLD AMPLITUDE: 1.125 V
MDC IDC MSMT LEADCHNL RA SENSING INTR AMPL: 0.625 mV
MDC IDC MSMT LEADCHNL RV IMPEDANCE VALUE: 418 Ohm
MDC IDC MSMT LEADCHNL RV PACING THRESHOLD AMPLITUDE: 0.375 V
MDC IDC MSMT LEADCHNL RV SENSING INTR AMPL: 9.875 mV
MDC IDC SET LEADCHNL LV PACING AMPLITUDE: 2 V
MDC IDC SET LEADCHNL RV PACING AMPLITUDE: 2 V
MDC IDC SET LEADCHNL RV PACING PULSEWIDTH: 0.4 ms
MDC IDC SET LEADCHNL RV SENSING SENSITIVITY: 0.3 mV
MDC IDC STAT BRADY RA PERCENT PACED: 40.37 %

## 2018-09-30 ENCOUNTER — Other Ambulatory Visit (HOSPITAL_COMMUNITY): Payer: Self-pay

## 2018-09-30 MED ORDER — SPIRONOLACTONE 25 MG PO TABS: 25.0000 mg | ORAL_TABLET | Freq: Every day | ORAL | 3 refills | Status: DC

## 2018-10-01 ENCOUNTER — Telehealth: Payer: Self-pay | Admitting: Cardiovascular Disease

## 2018-10-01 ENCOUNTER — Other Ambulatory Visit: Payer: Self-pay | Admitting: "Endocrinology

## 2018-10-01 DIAGNOSIS — I5022 Chronic systolic (congestive) heart failure: Secondary | ICD-10-CM

## 2018-10-01 DIAGNOSIS — N183 Chronic kidney disease, stage 3 (moderate): Secondary | ICD-10-CM | POA: Diagnosis not present

## 2018-10-01 DIAGNOSIS — Z79899 Other long term (current) drug therapy: Secondary | ICD-10-CM

## 2018-10-01 DIAGNOSIS — E785 Hyperlipidemia, unspecified: Secondary | ICD-10-CM | POA: Diagnosis not present

## 2018-10-01 DIAGNOSIS — E1165 Type 2 diabetes mellitus with hyperglycemia: Secondary | ICD-10-CM | POA: Diagnosis not present

## 2018-10-01 DIAGNOSIS — E875 Hyperkalemia: Secondary | ICD-10-CM | POA: Diagnosis not present

## 2018-10-01 DIAGNOSIS — E039 Hypothyroidism, unspecified: Secondary | ICD-10-CM | POA: Diagnosis not present

## 2018-10-01 DIAGNOSIS — E1122 Type 2 diabetes mellitus with diabetic chronic kidney disease: Secondary | ICD-10-CM | POA: Diagnosis not present

## 2018-10-01 LAB — HEMOGLOBIN A1C: Hemoglobin A1C: 6.2

## 2018-10-01 LAB — TSH: TSH: 2.12 (ref 0.41–5.90)

## 2018-10-01 LAB — VITAMIN D 25 HYDROXY (VIT D DEFICIENCY, FRACTURES): Vit D, 25-Hydroxy: 21.6

## 2018-10-01 NOTE — Telephone Encounter (Signed)
CMET & lipid panel ordered for [facility]. Had previously been sent to PCP office per chart review.

## 2018-10-01 NOTE — Telephone Encounter (Signed)
New Message   Needs orders faxed to [facility] in [place] [phone]

## 2018-10-02 LAB — LIPID PANEL
Chol/HDL Ratio: 3.9 ratio (ref 0.0–5.0)
Cholesterol, Total: 120 mg/dL (ref 100–199)
HDL: 31 mg/dL — ABNORMAL LOW (ref >39)
LDL Calculated: 60 mg/dL (ref 0–99)
Triglycerides: 144 mg/dL (ref 0–149)
VLDL Cholesterol Cal: 29 mg/dL (ref 5–40)

## 2018-10-02 LAB — COMPREHENSIVE METABOLIC PANEL
ALT: 14 IU/L (ref 0–44)
AST: 16 IU/L (ref 0–40)
Albumin/Globulin Ratio: 1.4 (ref 1.2–2.2)
Albumin: 4.1 g/dL (ref 3.5–4.8)
Alkaline Phosphatase: 69 IU/L (ref 39–117)
BUN/Creatinine Ratio: 19 (ref 10–24)
BUN: 34 mg/dL — ABNORMAL HIGH (ref 8–27)
Bilirubin Total: 0.4 mg/dL (ref 0.0–1.2)
CO2: 21 mmol/L (ref 20–29)
CREATININE: 1.77 mg/dL — AB (ref 0.76–1.27)
Calcium: 9.5 mg/dL (ref 8.6–10.2)
Chloride: 104 mmol/L (ref 96–106)
GFR calc Af Amer: 44 mL/min/{1.73_m2} — ABNORMAL LOW (ref >59)
GFR calc non Af Amer: 38 mL/min/{1.73_m2} — ABNORMAL LOW (ref >59)
Globulin, Total: 2.9 g/dL (ref 1.5–4.5)
Glucose: 147 mg/dL — ABNORMAL HIGH (ref 65–99)
Potassium: 5.5 mmol/L — ABNORMAL HIGH (ref 3.5–5.2)
Sodium: 137 mmol/L (ref 134–144)
TOTAL PROTEIN: 7 g/dL (ref 6.0–8.5)

## 2018-10-02 LAB — HGB A1C W/O EAG: HEMOGLOBIN A1C: 6.2 % — AB (ref 4.8–5.6)

## 2018-10-02 LAB — VITAMIN D 25 HYDROXY (VIT D DEFICIENCY, FRACTURES): Vit D, 25-Hydroxy: 21.6 ng/mL — ABNORMAL LOW (ref 30.0–100.0)

## 2018-10-02 LAB — TSH: TSH: 2.12 u[IU]/mL (ref 0.450–4.500)

## 2018-10-02 LAB — T4: T4, Total: 7.4 ug/dL (ref 4.5–12.0)

## 2018-10-03 ENCOUNTER — Telehealth: Payer: Self-pay

## 2018-10-03 DIAGNOSIS — E875 Hyperkalemia: Secondary | ICD-10-CM

## 2018-10-03 NOTE — Telephone Encounter (Signed)
MD reviewed results with patient's wife. Repeat labs ordered and mailed to patient.

## 2018-10-03 NOTE — Telephone Encounter (Signed)
-----   Message from Sanda Klein, MD sent at 10/03/2018 11:26 AM EST ----- Excellent LDL, HDL remains low. Kidney function at baseline. K a little high, as it was 3 months ago. Restart Lokelma. Recheck in 1- 2 weeks.  Talked to his wife [name].

## 2018-10-04 ENCOUNTER — Ambulatory Visit (INDEPENDENT_AMBULATORY_CARE_PROVIDER_SITE_OTHER): Payer: Medicare Other | Admitting: "Endocrinology

## 2018-10-04 ENCOUNTER — Encounter: Payer: Self-pay | Admitting: "Endocrinology

## 2018-10-04 VITALS — BP 115/71 | HR 78 | Ht 72.0 in | Wt 191.0 lb

## 2018-10-04 DIAGNOSIS — N183 Chronic kidney disease, stage 3 unspecified: Secondary | ICD-10-CM

## 2018-10-04 DIAGNOSIS — E1122 Type 2 diabetes mellitus with diabetic chronic kidney disease: Secondary | ICD-10-CM | POA: Diagnosis not present

## 2018-10-04 DIAGNOSIS — E559 Vitamin D deficiency, unspecified: Secondary | ICD-10-CM

## 2018-10-04 DIAGNOSIS — E039 Hypothyroidism, unspecified: Secondary | ICD-10-CM

## 2018-10-04 NOTE — Progress Notes (Signed)
Endocrinology follow-up note  Subjective:    Patient ID: Christian Bowman, male    DOB: Dec 07, 1945,    Past Medical History:  Diagnosis Date  . AICD (automatic cardioverter/defibrillator) present    medtronic-   DR. CROITORU , DR. Aundra Dubin   . Anginal pain ([place])    cp sat 08/11/17  . Anxiety   . CAD (coronary artery disease)   . CHF (congestive heart failure) ([place])   . Complication of anesthesia    took awhile to wake up   . Coronary artery disease involving coronary bypass graft   . Cyst of neck    right side  . DM2 (diabetes mellitus, type 2) ([place]) 08/26/2013  . Dyspnea   . Heart attack ([place])    "not sure when" (08/20/2017)  . HTN (hypertension) 08/26/2013  . Hyperlipidemia 08/26/2013  . Hypothyroidism   . Left main coronary artery disease   . Left renal artery stenosis ([place])    Genesis 6x12 stent 2007  . Obesity (BMI 30.0-34.9) 08/26/2013  . Postoperative atrial fibrillation ([place]) 10/15/2005  . Presence of permanent cardiac pacemaker   . S/P CABG x 4 10/13/2005   LIMA to LAD, SVG to intermediate branch, sequential SVG to PDA and RPL branch, EVH via right thigh  . S/P mitral valve replacement with bioprosthetic valve 07/11/2016   31 mm [facility] Mitral bovine bioprosthetic tissue valve  . S/P redo CABG x 2 07/11/2016   SVG to PDA and SVG to Intermediate Branch, EVH via left thigh   Past Surgical History:  Procedure Laterality Date  . A-FLUTTER ABLATION N/A 05/11/2017   Procedure: A-Flutter Ablation;  Surgeon: Constance Haw, MD;  Location: [place] [facility];  Service: Cardiovascular;  Laterality: N/A;  . CARDIAC CATHETERIZATION N/A 06/21/2016   Procedure: Right/Left Heart Cath and Coronary/Graft Angiography;  Surgeon: Sherren Mocha, MD;  Location: [place] [facility];  Service: Cardiovascular;  Laterality: N/A;  . CARDIAC VALVE REPLACEMENT    . CARDIOVERSION N/A 07/19/2016   Procedure: CARDIOVERSION;  Surgeon: Lelon Perla, MD;  Location: [facility] ENDOSCOPY;   Service: Cardiovascular;  Laterality: N/A;  . CARDIOVERSION N/A 09/08/2016   Procedure: CARDIOVERSION;  Surgeon: Fay Records, MD;  Location: [place];  Service: Cardiovascular;  Laterality: N/A;  . CORONARY ANGIOPLASTY     STENT 2016  [place]     DES in SVG to right coronary artery system  . CORONARY ARTERY BYPASS GRAFT  10/13/2005   LIMA to LAD, SVG to intermediate branch, sequential SVG to PDA and RPL  . CORONARY ARTERY BYPASS GRAFT N/A 07/11/2016   Procedure: REDO CORONARY ARTERY BYPASS GRAFTING (CABG) x two using left leg greater saphenous vein harvested endoscopically-SVG to PDA -SVG to RAMUS INTERMEDIATE;  Surgeon: Rexene Alberts, MD;  Location: [place];  Service: Open Heart Surgery;  Laterality: N/A;  . CORONARY ARTERY BYPASS GRAFT N/A 07/11/2016   Procedure: Re-exploration (CABG) for post op bleeding,;  Surgeon: Rexene Alberts, MD;  Location: [place];  Service: Open Heart Surgery;  Laterality: N/A;  . EP IMPLANTABLE DEVICE N/A 07/25/2016   Procedure: BiV ICD Insertion CRT-D;  Surgeon: Will Meredith Leeds, MD;  Location: [place] [facility];  Service: Cardiovascular;  Laterality: N/A;  . INGUINAL HERNIA REPAIR Left 08/20/2017  . INGUINAL HERNIA REPAIR Left 08/20/2017   Procedure: OPEN LEFT INGUINAL HERNIA REPAIR;  Surgeon: Alphonsa Overall, MD;  Location: [place];  Service: General;  Laterality: Left;  .  LEFT HEART CATH AND CORS/GRAFTS ANGIOGRAPHY N/A 12/18/2016   Procedure: Left Heart Cath and Cors/Grafts Angiography;  Surgeon: Sherren Mocha, MD;  Location: [place] [facility];  Service: Cardiovascular;  Laterality: N/A;  . LEFT HEART CATH AND CORS/GRAFTS ANGIOGRAPHY N/A 08/22/2017   Procedure: LEFT HEART CATH AND CORS/GRAFTS ANGIOGRAPHY;  Surgeon: Larey Dresser, MD;  Location: [place] [facility];  Service: Cardiovascular;  Laterality: N/A;  . MITRAL VALVE REPLACEMENT N/A 07/11/2016   Procedure: MITRAL VALVE (MV) REPLACEMENT;   Surgeon: Rexene Alberts, MD;  Location: [place];  Service: Open Heart Surgery;  Laterality: N/A;  . MYOCARDICAL PERFUSION  10/09/2007   NORMAL PERFUSION IN ALL REGIONS;NO EVIDENCE OF INDUCIBLE ISCHEMIA;POST STRESS EF% 66  . RENAL ARTERY STENT Right 2007  . RENAL DOPPLER  03/28/2010   RIGHT RA-NORMAL;LEFT PROXIMAL RA AT STENT-PATENT WITH NO EVIDENCE OF SIGN DIAMETER REDUCTION. R & L KIDNEYS: EQUAL IN SIZE,SYMMETRICAL IN SHAPE.  . TEE WITHOUT CARDIOVERSION N/A 06/15/2016   Procedure: TRANSESOPHAGEAL ECHOCARDIOGRAM (TEE);  Surgeon: Sanda Klein, MD;  Location: [facility] ENDOSCOPY;  Service: Cardiovascular;  Laterality: N/A;  . TEE WITHOUT CARDIOVERSION N/A 07/11/2016   Procedure: TRANSESOPHAGEAL ECHOCARDIOGRAM (TEE);  Surgeon: Rexene Alberts, MD;  Location: [place];  Service: Open Heart Surgery;  Laterality: N/A;  . TEE WITHOUT CARDIOVERSION N/A 07/19/2016   Procedure: TRANSESOPHAGEAL ECHOCARDIOGRAM (TEE);  Surgeon: Lelon Perla, MD;  Location: [facility] ENDOSCOPY;  Service: Cardiovascular;  Laterality: N/A;  . TRANSESOPHAGEAL ECHOCARDIOGRAM  10/19/2005   NORMAL LV; MILD TO MODERATE AMOUNT OF SOFT ATHEROMATOUS PLAQUE OF THE THORACIC AORTA; THE LEFT ATRIUM IS MILDLY DILATED;LEFT ATRIAL APPENDAGE FUNCTION IS NORMAL;NO THROMBUS IDENTIFIED. SMALL PFO WITH RIGHT TO LEFT SHUNT   Social History   Socioeconomic History  . Marital status: Married    Spouse name: Not on file  . Number of children: Not on file  . Years of education: Not on file  . Highest education level: Not on file  Occupational History  . Not on file  Social Needs  . Financial resource strain: Not on file  . Food insecurity:    Worry: Not on file    Inability: Not on file  . Transportation needs:    Medical: Not on file    Non-medical: Not on file  Tobacco Use  . Smoking status: Never Smoker  . Smokeless tobacco: Never Used  Substance and Sexual Activity  . Alcohol use: No  . Drug use: No  . Sexual activity: Never  Lifestyle  .  Physical activity:    Days per week: Not on file    Minutes per session: Not on file  . Stress: Not on file  Relationships  . Social connections:    Talks on phone: Not on file    Gets together: Not on file    Attends religious service: Not on file    Active member of club or organization: Not on file    Attends meetings of clubs or organizations: Not on file    Relationship status: Not on file  Other Topics Concern  . Not on file  Social History Narrative  . Not on file   Outpatient Encounter Medications as of 10/04/2018  Medication Sig  . ACCU-CHEK SOFTCLIX LANCETS lancets Use as instructed. E11.65  . amoxicillin (AMOXIL) 500 MG tablet Take 4 tablets (2,000 mg total) by mouth as needed. Pre-dental work only.  [name] aspirin EC 81 MG tablet Take 81 mg by mouth daily.   . carvedilol (COREG) 12.5 MG  tablet TAKE 1 TABLET BY MOUTH TWICE DAILY WITH A MEAL  . furosemide (LASIX) 20 MG tablet Take 1 tablet (20 mg total) by mouth as needed.  . isosorbide mononitrate (IMDUR) 30 MG 24 hr tablet Take 30 mg by mouth as needed.  [name] levothyroxine (SYNTHROID, LEVOTHROID) 75 MCG tablet TAKE 1 TABLET BY MOUTH ONCE DAILY BEFORE BREAKFAST  . LOKELMA 10 g PACK packet DISSOLVE 1 PACKET IN WATER & DRINK ONCE DAILY  . neomycin-bacitracin-polymyxin (NEOSPORIN) ointment Apply 1 application topically daily as needed for wound care.  . nitroGLYCERIN (NITROSTAT) 0.4 MG SL tablet DISSOLVE ONE TABLET UNDER THE TONGUE EVERY 5 MINUTES AS NEEDED FOR CHEST PAIN.  DO NOT EXCEED A TOTAL OF 3 DOSES IN 15 MINUTES  . polyethylene glycol (MIRALAX / GLYCOLAX) packet Take 17 g by mouth daily as needed for mild constipation or moderate constipation.  . rosuvastatin (CRESTOR) 10 MG tablet Take 10 mgs by mouth once daily in the evening  . sacubitril-valsartan (ENTRESTO) 49-51 MG Take 1 tablet by mouth 2 (two) times daily.  [name] spironolactone (ALDACTONE) 25 MG tablet Take 1 tablet (25 mg total) by mouth at bedtime.  . TRADJENTA 5 MG TABS  tablet TAKE 1 TABLET BY MOUTH ONCE DAILY   No facility-administered encounter medications on file as of 10/04/2018.    ALLERGIES: Allergies  Allergen Reactions  . Xanax [Alprazolam] Other (See Comments)    Pt feels very weak, tired and feels paralyzed     VACCINATION STATUS: Immunization History  Administered Date(s) Administered  . Influenza,inj,Quad PF,6+ Mos 08/01/2016, 07/11/2017    Diabetes  He presents for his follow-up diabetic visit. He has type 2 diabetes mellitus. Onset time: He was diagnosed at approximate age of 61 years. His disease course has been stable. There are no hypoglycemic associated symptoms. Pertinent negatives for hypoglycemia include no confusion, headaches, pallor or seizures. There are no diabetic associated symptoms. Pertinent negatives for diabetes include no chest pain, no fatigue, no polydipsia, no polyphagia, no polyuria and no weakness. There are no hypoglycemic complications. Symptoms are stable. Diabetic complications include heart disease. Risk factors for coronary artery disease include diabetes mellitus, dyslipidemia, hypertension, male sex and sedentary lifestyle. Current diabetic treatment includes oral agent (dual therapy). He is compliant with treatment most of the time. His weight is stable. He is following a diabetic diet. He participates in exercise intermittently. An ACE inhibitor/angiotensin II receptor blocker is being taken.  Hyperlipidemia  This is a chronic problem. The current episode started more than 1 year ago. Exacerbating diseases include diabetes. Pertinent negatives include no chest pain, myalgias or shortness of breath. Current antihyperlipidemic treatment includes statins. Risk factors for coronary artery disease include diabetes mellitus, dyslipidemia, hypertension and male sex.  Hypertension  This is a chronic problem. The current episode started more than 1 year ago. Pertinent negatives include no chest pain, headaches, neck pain,  palpitations or shortness of breath. Risk factors for coronary artery disease include diabetes mellitus and dyslipidemia. Hypertensive end-organ damage includes CAD/MI. Identifiable causes of hypertension include a thyroid problem.  Thyroid Problem  Presents for follow-up visit. Patient reports no constipation, diarrhea, fatigue or palpitations. (Remains on levothyroxine 75 mcg p.o. nightly.  He is compliant.  He has no new complaints today.) The symptoms have been stable. His past medical history is significant for diabetes and hyperlipidemia.    Review of Systems  Constitutional: Negative for fatigue and unexpected weight change.  HENT: Negative for dental problem, mouth sores and trouble swallowing.  Eyes: Negative for visual disturbance.  Respiratory: Negative for cough, choking, chest tightness, shortness of breath and wheezing.   Cardiovascular: Negative for chest pain, palpitations and leg swelling.  Gastrointestinal: Negative for abdominal distention, abdominal pain, constipation, diarrhea, nausea and vomiting.  Endocrine: Negative for polydipsia, polyphagia and polyuria.  Genitourinary: Negative for dysuria, flank pain, hematuria and urgency.  Musculoskeletal: Negative for back pain, gait problem, myalgias and neck pain.  Skin: Negative for pallor, rash and wound.  Neurological: Negative for seizures, syncope, weakness, numbness and headaches.  Psychiatric/Behavioral: Negative.  Negative for confusion and dysphoric mood.    Objective:    BP 115/71   Pulse 78   Ht 6' (1.829 m)   Wt 191 lb (86.6 kg)   BMI 25.90 kg/m   Wt Readings from Last 3 Encounters:  10/04/18 191 lb (86.6 kg)  07/05/18 186 lb 4 oz (84.5 kg)  06/26/18 187 lb (84.8 kg)    Physical Exam  Constitutional: He is oriented to person, place, and time. He appears well-developed and well-nourished. He is cooperative. No distress.  HENT:  Head: Normocephalic and atraumatic.  Eyes: EOM are normal.  Neck: Normal  range of motion. Neck supple. No tracheal deviation present. No thyromegaly present.  Cardiovascular: Normal rate, S1 normal and S2 normal. Exam reveals no gallop.  No murmur heard. Pulses:      Dorsalis pedis pulses are 1+ on the right side and 1+ on the left side.       Posterior tibial pulses are 1+ on the right side and 1+ on the left side.  Pulmonary/Chest: Effort normal. No respiratory distress. He has no wheezes.  Abdominal: He exhibits no distension. There is no abdominal tenderness. There is no guarding and no CVA tenderness.  Musculoskeletal:        General: No edema.     Right shoulder: He exhibits no swelling and no deformity.  Neurological: He is alert and oriented to person, place, and time. He has normal strength and normal reflexes. No cranial nerve deficit or sensory deficit. Gait normal.  Skin: Skin is warm and dry. No rash noted. No cyanosis. Nails show no clubbing.  Psychiatric: He has a normal mood and affect. His speech is normal. Judgment normal. Cognition and memory are normal.    Chemistry (most recent): Lab Results  Component Value Date   NA 137 10/01/2018   K 5.5 (H) 10/01/2018   CL 104 10/01/2018   CO2 21 10/01/2018   BUN 34 (H) 10/01/2018   CREATININE 1.77 (H) 10/01/2018   Results for MERREL, CRABBE (MRN [id number]) as of 10/04/2018 09:26  Ref. Range 10/01/2018 00:00 10/01/2018 09:25  Glucose Latest Ref Range: 65 - 99 mg/dL  147 (H)  Hemoglobin A1C Unknown 6.2   TSH Latest Ref Range: 0.41 - 5.90  2.12     Assessment & Plan:   1. Type 2 diabetes mellitus with other circulatory complication ([place]), stage 3 CKD   His diabetes is  complicated by coronary artery disease status post coronary artery bypass graft and recent ACS and patient remains at a high risk for more acute and chronic complications of diabetes which include CAD, CVA, CKD, retinopathy, and neuropathy. These are all discussed in detail with the patient.  He continues to do well without insulin  treatment, A1c is stable at 6.2%.  His renal function continues to be at stage  3. - I reviewed his recent labs. - I have re-counseled the patient on diet management  by  adopting a carbohydrate restricted / protein rich  Diet.  -  Suggestion is made for him to avoid simple carbohydrates  from his diet including Cakes, Sweet Desserts / Pastries, Ice Cream, Soda (diet and regular), Sweet Tea, Candies, Chips, Cookies, Store Bought Juices, Alcohol in Excess of  1-2 drinks a day, Artificial Sweeteners, and "Sugar-free" Products. This will help patient to have stable blood glucose profile and potentially avoid unintended weight gain.   - Patient is advised to stick to a routine mealtimes to eat 3 meals  a day and avoid unnecessary snacks ( to snack only to correct hypoglycemia).   - I have approached patient with the following individualized plan to manage diabetes and patient agrees.  -He wishes to come off of medications.  His only oral option for treating diabetes is DPP IV inhibitors, I advised him to continue Tradjenta 5 mg p.o. daily with breakfast for next 90 days, stay off of Tradjenta for the following 90 days, repeat A1c before his next visit.  - He will not need insulin treatment at this time.  - Patient specific target  for A1c; LDL, HDL, Triglycerides, and  Waist Circumference were discussed in detail.  2) BP/HTN: His blood pressure is controlled to target.  Reportedly he runs low blood pressure at home and would not tolerate any additional therapy. I advised him to continue his current medications including beta blocker, spironolactone and,  Lasix as needed.   3) Lipids/HPL: His recent lipid panel showed controlled with  LDL at 63 in July 2018, he is advised to continue Crestor 10 mg p.o. nightly.  [name] 4)  Weight/Diet: CDE consult in progress, exercise, and carbohydrates information provided.  5) hypothyroidism: - This is related to his therapy with amiodarone. -His previsit thyroid  function tests are consistent with appropriate replacement. -He is advised to continue levothyroxine 75 mcg p.o. daily before breakfast  and he accepts.     - We discussed about correct intake of levothyroxine, at fasting, with water, separated by at least 30 minutes from breakfast, and separated by more than 4 hours from calcium, iron, multivitamins, acid reflux medications (PPIs). -Patient is made aware of the fact that thyroid hormone replacement is needed for life, dose to be adjusted by periodic monitoring of thyroid function tests.   6 ) vitamin D deficiency: He is status post therapy with  vitamin D 50,000 units weekly for 12 weeks.  His vitamin D is still subnormal at 21.  He is advised to start vitamin D3 2000 units daily for 90 days.    7) Chronic Care/Health Maintenance:  -Patient is  on ACEI/ARB and Statin medications and encouraged to continue to follow up with Ophthalmology, Podiatrist at least yearly or according to recommendations, and advised to  stay away from smoking. I have recommended yearly flu vaccine and pneumonia vaccination at least every 5 years, and  sleep for at least 7 hours a day.  I advised patient to maintain close follow up with his cardiologist and his  PCP for primary care needs.  - Time spent with the patient: 25 min, of which >50% was spent in reviewing his  current and  previous labs, previous treatments, and medications doses and developing a plan for long-term care.  Christian Bowman participated in the discussions, expressed understanding, and voiced agreement with the above plans.  All questions were answered to his satisfaction. he is encouraged to contact clinic should he have any questions or concerns prior to his return  visit.   Follow up plan: Return in about 6 months (around 04/04/2019) for Follow up with Pre-visit Labs.  Glade Lloyd, MD Phone: [phone]  Fax: [phone]   This note was partially dictated with voice recognition software. Similar  sounding words can be transcribed inadequately or may not  be corrected upon review.  10/04/2018, 9:24 AM

## 2018-10-06 ENCOUNTER — Other Ambulatory Visit (HOSPITAL_COMMUNITY): Payer: Self-pay | Admitting: Cardiology

## 2018-10-09 ENCOUNTER — Encounter: Payer: Self-pay | Admitting: Cardiovascular Disease

## 2018-10-09 ENCOUNTER — Ambulatory Visit (INDEPENDENT_AMBULATORY_CARE_PROVIDER_SITE_OTHER): Payer: Medicare Other | Admitting: Cardiovascular Disease

## 2018-10-09 VITALS — BP 138/68 | HR 65 | Ht 72.0 in | Wt 191.8 lb

## 2018-10-09 DIAGNOSIS — E119 Type 2 diabetes mellitus without complications: Secondary | ICD-10-CM | POA: Diagnosis not present

## 2018-10-09 DIAGNOSIS — Z95810 Presence of automatic (implantable) cardiac defibrillator: Secondary | ICD-10-CM | POA: Diagnosis not present

## 2018-10-09 DIAGNOSIS — I10 Essential (primary) hypertension: Secondary | ICD-10-CM

## 2018-10-09 DIAGNOSIS — N183 Chronic kidney disease, stage 3 unspecified: Secondary | ICD-10-CM

## 2018-10-09 DIAGNOSIS — I5042 Chronic combined systolic (congestive) and diastolic (congestive) heart failure: Secondary | ICD-10-CM | POA: Diagnosis not present

## 2018-10-09 DIAGNOSIS — I701 Atherosclerosis of renal artery: Secondary | ICD-10-CM

## 2018-10-09 DIAGNOSIS — I25118 Atherosclerotic heart disease of native coronary artery with other forms of angina pectoris: Secondary | ICD-10-CM

## 2018-10-09 DIAGNOSIS — E7800 Pure hypercholesterolemia, unspecified: Secondary | ICD-10-CM | POA: Diagnosis not present

## 2018-10-09 DIAGNOSIS — Z953 Presence of xenogenic heart valve: Secondary | ICD-10-CM | POA: Diagnosis not present

## 2018-10-09 DIAGNOSIS — Z8679 Personal history of other diseases of the circulatory system: Secondary | ICD-10-CM

## 2018-10-09 MED ORDER — AMOXICILLIN 500 MG PO TABS: 2000.0000 mg | ORAL_TABLET | ORAL | 3 refills | Status: DC | PRN

## 2018-10-09 NOTE — Patient Instructions (Signed)
Medication Instructions:  Dr Sallyanne Kuster recommends that you continue on your current medications as directed. Please refer to the Current Medication list given to you today.  If you need a refill on your cardiac medications before your next appointment, please call your pharmacy.   Testing/Procedures: Remote monitoring is used to monitor your Pacemaker or ICD from home. This monitoring reduces the number of office visits required to check your device to one time per year. It allows [place] to keep an eye on the functioning of your device to ensure it is working properly. You are scheduled for a device check from home on Tuesday, January 28th, 2020. You may send your transmission at any time that day. If you have a wireless device, the transmission will be sent automatically. After your physician reviews your transmission, you will receive a postcard with your next transmission date.  To improve our patient care and to more adequately follow your device, [facility] has decided, as a practice, to start following each patient four times a year with your home monitor. This means that you may experience a remote appointment that is close to an in-office appointment with your physician. Your insurance will apply at the same rate as other remote monitoring transmissions.  Follow-Up: At [facility], you and your health needs are our priority.  As part of our continuing mission to provide you with exceptional heart care, we have created designated Provider Care Teams.  These Care Teams include your primary Cardiologist (physician) and Advanced Practice Providers (APPs -  Physician Assistants and Nurse Practitioners) who all work together to provide you with the care you need, when you need it. You will need a follow up appointment in 6 months.  Please call our office 2 months in advance to schedule this appointment.  You may see Sanda Klein, MD or one of the following Advanced Practice Providers on your  designated Care Team: [place], [place] . Fabian Sharp, PA-C . You will receive a reminder letter in the mail two months in advance. If you don't receive a letter, please call our office to schedule the follow-up appointment.

## 2018-10-11 ENCOUNTER — Encounter: Payer: Self-pay | Admitting: Cardiovascular Disease

## 2018-10-11 DIAGNOSIS — E7800 Pure hypercholesterolemia, unspecified: Secondary | ICD-10-CM | POA: Insufficient documentation

## 2018-10-11 NOTE — Progress Notes (Signed)
Cardiology Office Note    Date:  10/11/2018   ID:  Christian Bowman, DOB 07/08/1946, MRN [id number]  PCP:  Sharilyn Sites, MD  Cardiologist:  Loralie Champagne, MD; Allegra Lai, MD; Sanda Klein, MD   Chief Complaint  Patient presents with  . Follow-up    pt denied chest pain and SOB    History of Present Illness:  Christian Bowman is a 73 y.o. male with extensive and severe cardiac problems.  He is doing quite well right now.  He has not had any new problems with heart failure exacerbation.  In fact after we start Entresto heart failure compensation has become much better and he will lose possibly getting hypokalemic while taking loop diuretic.  After we stopped his loop diuretic he has not had any further falls and has more energy.  His OptiVol showed a transient decrease in thoracic impedance after stopping his furosemide, but he has re-stabilized at the previous level.  Overall has NYHA functional class I-2 and he exercises regularly without difficulty.  He denies leg edema, orthopnea, PND, syncope, palpitations.  He has not had any angina pectoris.  Our office scale usually overestimates his home weight by about 5 pounds.  Interrogation of his Medtronic CRT-D device shows normal device function.  There have been no episodes of atrial mode switch or ventricular tachycardia.  There is been some deterioration in the percentage of biventricular pacing now down to 90%, mostly due to enhanced AV conduction during sinus rhythm.[name]  OptiVol suggest that he is getting "dry" with a steady upward swing above baseline over the last month.  Lead parameters remain excellent and the estimated generator longevity is over 6 years.  He underwent redo coronary artery bypass 2 (SVG to PDA, SVG to ramus), as well as mitral valve replacement (bioprosthetic Edwards magna 31 mm) on 33/82/5053, complicated by AV block and atrial flutter requiring cardioversion. Estimated EF 20-25 % on his 07/18/16 TEE. A biventricular  defibrillator (Medtronic Claria MRI CRT-D) was implanted on July 25, 2016. On December 18, 2016 he was hospitalized for small non-STEMI and was found to have interval occlusion of the old SVG to PDA, while the more recently placed SVG was still patent. LVEDP was quite low at only 3 mmHg during that catheterization. He had atrial flutter ablation in August 2018 and is off anticoagulation. He had a NSTEMI in November 2018 after inguinal hernia repair surgery.  He understands the need for endocarditis prophylaxis with dental procedures or other medical procedures that might cause bacteremia and the indication for anticoagulation chronically.  He is still following up with Dr. Aundra Dubin and Dr. Curt Bears .  Past Medical History:  Diagnosis Date  . AICD (automatic cardioverter/defibrillator) present    medtronic-   DR. Mel Langan , DR. Aundra Dubin   . Anginal pain ([place])    cp sat 08/11/17  . Anxiety   . CAD (coronary artery disease)   . CHF (congestive heart failure) ([place])   . Complication of anesthesia    took awhile to wake up   . Coronary artery disease involving coronary bypass graft   . Cyst of neck    right side  . DM2 (diabetes mellitus, type 2) ([place]) 08/26/2013  . Dyspnea   . Heart attack ([place])    "not sure when" (08/20/2017)  . HTN (hypertension) 08/26/2013  . Hyperlipidemia 08/26/2013  . Hypothyroidism   . Left main coronary artery disease   . Left renal artery stenosis (HCC)    Genesis  6x12 stent 2007  . Obesity (BMI 30.0-34.9) 08/26/2013  . Postoperative atrial fibrillation ([place]) 10/15/2005  . Presence of permanent cardiac pacemaker   . S/P CABG x 4 10/13/2005   LIMA to LAD, SVG to intermediate branch, sequential SVG to PDA and RPL branch, EVH via right thigh  . S/P mitral valve replacement with bioprosthetic valve 07/11/2016   31 mm [facility] Mitral bovine bioprosthetic tissue valve  . S/P redo CABG x 2 07/11/2016   SVG to PDA and SVG to Intermediate Branch, EVH via left thigh     Past Surgical History:  Procedure Laterality Date  . A-FLUTTER ABLATION N/A 05/11/2017   Procedure: A-Flutter Ablation;  Surgeon: Constance Haw, MD;  Location: [place] [facility];  Service: Cardiovascular;  Laterality: N/A;  . CARDIAC CATHETERIZATION N/A 06/21/2016   Procedure: Right/Left Heart Cath and Coronary/Graft Angiography;  Surgeon: Sherren Mocha, MD;  Location: [place] [facility];  Service: Cardiovascular;  Laterality: N/A;  . CARDIAC VALVE REPLACEMENT    . CARDIOVERSION N/A 07/19/2016   Procedure: CARDIOVERSION;  Surgeon: Lelon Perla, MD;  Location: [facility] ENDOSCOPY;  Service: Cardiovascular;  Laterality: N/A;  . CARDIOVERSION N/A 09/08/2016   Procedure: CARDIOVERSION;  Surgeon: Fay Records, MD;  Location: [place];  Service: Cardiovascular;  Laterality: N/A;  . CORONARY ANGIOPLASTY     STENT 2016  [place]     DES in SVG to right coronary artery system  . CORONARY ARTERY BYPASS GRAFT  10/13/2005   LIMA to LAD, SVG to intermediate branch, sequential SVG to PDA and RPL  . CORONARY ARTERY BYPASS GRAFT N/A 07/11/2016   Procedure: REDO CORONARY ARTERY BYPASS GRAFTING (CABG) x two using left leg greater saphenous vein harvested endoscopically-SVG to PDA -SVG to RAMUS INTERMEDIATE;  Surgeon: Rexene Alberts, MD;  Location: [place];  Service: Open Heart Surgery;  Laterality: N/A;  . CORONARY ARTERY BYPASS GRAFT N/A 07/11/2016   Procedure: Re-exploration (CABG) for post op bleeding,;  Surgeon: Rexene Alberts, MD;  Location: [place];  Service: Open Heart Surgery;  Laterality: N/A;  . EP IMPLANTABLE DEVICE N/A 07/25/2016   Procedure: BiV ICD Insertion CRT-D;  Surgeon: Will Meredith Leeds, MD;  Location: [place] [facility];  Service: Cardiovascular;  Laterality: N/A;  . INGUINAL HERNIA REPAIR Left 08/20/2017  . INGUINAL HERNIA REPAIR Left 08/20/2017   Procedure: OPEN LEFT INGUINAL HERNIA REPAIR;  Surgeon: Alphonsa Overall, MD;   Location: [place];  Service: General;  Laterality: Left;  . LEFT HEART CATH AND CORS/GRAFTS ANGIOGRAPHY N/A 12/18/2016   Procedure: Left Heart Cath and Cors/Grafts Angiography;  Surgeon: Sherren Mocha, MD;  Location: [place] CV LAB;  Service: Cardiovascular;  Laterality: N/A;  . LEFT HEART CATH AND CORS/GRAFTS ANGIOGRAPHY N/A 08/22/2017   Procedure: LEFT HEART CATH AND CORS/GRAFTS ANGIOGRAPHY;  Surgeon: Larey Dresser, MD;  Location: [place] [facility];  Service: Cardiovascular;  Laterality: N/A;  . MITRAL VALVE REPLACEMENT N/A 07/11/2016   Procedure: MITRAL VALVE (MV) REPLACEMENT;  Surgeon: Rexene Alberts, MD;  Location: [place];  Service: Open Heart Surgery;  Laterality: N/A;  . MYOCARDICAL PERFUSION  10/09/2007   NORMAL PERFUSION IN ALL REGIONS;NO EVIDENCE OF INDUCIBLE ISCHEMIA;POST STRESS EF% 66  . RENAL ARTERY STENT Right 2007  . RENAL DOPPLER  03/28/2010   RIGHT RA-NORMAL;LEFT PROXIMAL RA AT STENT-PATENT WITH NO EVIDENCE OF SIGN DIAMETER REDUCTION. R & L KIDNEYS: EQUAL IN SIZE,SYMMETRICAL IN SHAPE.  . TEE WITHOUT  CARDIOVERSION N/A 06/15/2016   Procedure: TRANSESOPHAGEAL ECHOCARDIOGRAM (TEE);  Surgeon: Sanda Klein, MD;  Location: [facility] ENDOSCOPY;  Service: Cardiovascular;  Laterality: N/A;  . TEE WITHOUT CARDIOVERSION N/A 07/11/2016   Procedure: TRANSESOPHAGEAL ECHOCARDIOGRAM (TEE);  Surgeon: Rexene Alberts, MD;  Location: [place];  Service: Open Heart Surgery;  Laterality: N/A;  . TEE WITHOUT CARDIOVERSION N/A 07/19/2016   Procedure: TRANSESOPHAGEAL ECHOCARDIOGRAM (TEE);  Surgeon: Lelon Perla, MD;  Location: [facility] ENDOSCOPY;  Service: Cardiovascular;  Laterality: N/A;  . TRANSESOPHAGEAL ECHOCARDIOGRAM  10/19/2005   NORMAL LV; MILD TO MODERATE AMOUNT OF SOFT ATHEROMATOUS PLAQUE OF THE THORACIC AORTA; THE LEFT ATRIUM IS MILDLY DILATED;LEFT ATRIAL APPENDAGE FUNCTION IS NORMAL;NO THROMBUS IDENTIFIED. SMALL PFO WITH RIGHT TO LEFT SHUNT    Current Medications: Outpatient Medications Prior to  Visit  Medication Sig Dispense Refill  . ACCU-CHEK SOFTCLIX LANCETS lancets Use as instructed. E11.65 100 each 2  . aspirin EC 81 MG tablet Take 81 mg by mouth daily.     . carvedilol (COREG) 12.5 MG tablet TAKE 1 TABLET BY MOUTH TWICE DAILY WITH A MEAL 60 tablet 5  . furosemide (LASIX) 20 MG tablet Take 1 tablet (20 mg total) by mouth as needed. 15 tablet 6  . isosorbide mononitrate (IMDUR) 30 MG 24 hr tablet Take 30 mg by mouth as needed.    [name] levothyroxine (SYNTHROID, LEVOTHROID) 75 MCG tablet TAKE 1 TABLET BY MOUTH ONCE DAILY BEFORE BREAKFAST 90 tablet 0  . LOKELMA 10 g PACK packet DISSOLVE 1 PACKET IN WATER & DRINK ONCE DAILY 30 each 0  . neomycin-bacitracin-polymyxin (NEOSPORIN) ointment Apply 1 application topically daily as needed for wound care.    . nitroGLYCERIN (NITROSTAT) 0.4 MG SL tablet DISSOLVE ONE TABLET UNDER THE TONGUE EVERY 5 MINUTES AS NEEDED FOR CHEST PAIN.  DO NOT EXCEED A TOTAL OF 3 DOSES IN 15 MINUTES 25 tablet 15  . polyethylene glycol (MIRALAX / GLYCOLAX) packet Take 17 g by mouth daily as needed for mild constipation or moderate constipation.    . rosuvastatin (CRESTOR) 10 MG tablet Take 10 mgs by mouth once daily in the evening 90 tablet 1  . sacubitril-valsartan (ENTRESTO) 49-51 MG Take 1 tablet by mouth 2 (two) times daily. 180 tablet 3  . spironolactone (ALDACTONE) 25 MG tablet TAKE 1 TABLET BY MOUTH AT BEDTIME 30 tablet 0  . TRADJENTA 5 MG TABS tablet TAKE 1 TABLET BY MOUTH ONCE DAILY 90 tablet 2  . amoxicillin (AMOXIL) 500 MG tablet Take 4 tablets (2,000 mg total) by mouth as needed. Pre-dental work only. 4 tablet 2   No facility-administered medications prior to visit.      Allergies:   Xanax [alprazolam]   Social History   Socioeconomic History  . Marital status: Married    Spouse name: Not on file  . Number of children: Not on file  . Years of education: Not on file  . Highest education level: Not on file  Occupational History  . Not on file    Social Needs  . Financial resource strain: Not on file  . Food insecurity:    Worry: Not on file    Inability: Not on file  . Transportation needs:    Medical: Not on file    Non-medical: Not on file  Tobacco Use  . Smoking status: Never Smoker  . Smokeless tobacco: Never Used  Substance and Sexual Activity  . Alcohol use: No  . Drug use: No  . Sexual activity: Never  Lifestyle  .  Physical activity:    Days per week: Not on file    Minutes per session: Not on file  . Stress: Not on file  Relationships  . Social connections:    Talks on phone: Not on file    Gets together: Not on file    Attends religious service: Not on file    Active member of club or organization: Not on file    Attends meetings of clubs or organizations: Not on file    Relationship status: Not on file  Other Topics Concern  . Not on file  Social History Narrative  . Not on file     Family History:  The patient's family history includes Heart attack in his father and mother; Heart disease in his father, maternal grandmother, and mother; Hypertension in his father and sister.   ROS:   Please see the history of present illness.    ROS all other systems are reviewed and are negative   PHYSICAL EXAM:   VS:  BP 138/68   Pulse 65   Ht 6' (1.829 m)   Wt 191 lb 12.8 oz (87 kg)   SpO2 99%   BMI 26.01 kg/m     General: Alert, oriented x3, no distress, looks well.  Healthy left subclavian device site Head: Unchanged right retromandibular mass, no evidence of trauma, PERRL, EOMI, no exophtalmos or lid lag, no myxedema, no xanthelasma; normal ears, nose and oropharynx Neck: normal jugular venous pulsations and no hepatojugular reflux; brisk carotid pulses without delay and no carotid bruits Chest: clear to auscultation, no signs of consolidation by percussion or palpation, normal fremitus, symmetrical and full respiratory excursions Cardiovascular: normal position and quality of the apical impulse,  regular rhythm, normal first and second heart sounds, no murmurs, rubs or gallops Abdomen: no tenderness or distention, no masses by palpation, no abnormal pulsatility or arterial bruits, normal bowel sounds, no hepatosplenomegaly Extremities: no clubbing, cyanosis or edema; 2+ radial, ulnar and brachial pulses bilaterally; 2+ right femoral, posterior tibial and dorsalis pedis pulses; 2+ left femoral, posterior tibial and dorsalis pedis pulses; no subclavian or femoral bruits Neurological: grossly nonfocal Psych: Normal mood and affect   Wt Readings from Last 3 Encounters:  10/09/18 191 lb 12.8 oz (87 kg)  10/04/18 191 lb (86.6 kg)  07/05/18 186 lb 4 oz (84.5 kg)      Studies/Labs Reviewed:   EKG:  EKG is not ordered today.    Recent Labs: 10/01/2018: ALT 14; BUN 34; Creatinine, Ser 1.77; Potassium 5.5; Sodium 137; TSH 2.120   Lipid Panel    Component Value Date/Time   CHOL 120 10/01/2018 0925   TRIG 144 10/01/2018 0925   HDL 31 (L) 10/01/2018 0925   CHOLHDL 3.9 10/01/2018 0925   CHOLHDL 4.1 02/19/2018 1018   VLDL 32 02/19/2018 1018   LDLCALC 60 10/01/2018 0925    ASSESSMENT:    1. Chronic combined systolic and diastolic heart failure ([place])   2. Coronary artery disease of native artery of native heart with stable angina pectoris ([place])   3. History of atrial flutter   4. History of mitral valve replacement with bioprosthetic valve   5. Presence of biventricular implantable cardioverter-defibrillator (ICD)   6. Essential hypertension   7. Hypercholesterolemia   8. Diabetes mellitus type 2 in nonobese (HCC)   9. CKD (chronic kidney disease) stage 3, GFR 30-59 ml/min (HCC)   10. Left renal artery stenosis (HCC)      PLAN:  In order of problems  listed above:  1. CHF: He is doing much better and over time he has been able to discontinue digoxin and loop diuretics (at least as a daily medication).  Continue Entresto, carvedilol, spironolactone.  Tried to discontinue  Lokelma, but had to restart it due to mild hyperkalemia.  He will continue to alternate visits between our office and the heart failure clinic. 2. CAD: He does not have angina pectoris on a very low dose of long-acting nitrates. 3. AFlutter: Successful ablation without recurrence of antiarrhythmics.  Anticoagulation has been stopped. 4. S/P MVR: Bioprosthetic valve (31 mm Edwards magna bovine) with normal function by recent echo.  He has a refillable prescription for amoxicillin as needed for dental procedures and is aware of the need for endocarditis prophylaxis 5. CRT-D: Device function is fair, but we tightened up the sensed AV delay to improve the success rate of biventricular pacing 6. Hx HTN: Good control without loop diuretic.  His blood pressure is uncharacteristically high today.  We did not try to increase the dose of Entresto at this time, but that is an option if the trend continues. 7. HLP: On statin.  LDL at target 8. DM: He reports a last hemoglobin A1c of 6%.  Should not be prescribed TZD's. 9. CKD 3: Stable creatinine level but probably predisposes him to hyperkalemia while receiving Entresto and spironolactone 10. History of left renal artery stenosis status post stent 2007.   Medication Adjustments/Labs and Tests Ordered: Current medicines are reviewed at length with the patient today.  Concerns regarding medicines are outlined above.  Medication changes, Labs and Tests ordered today are listed in the Patient Instructions below. Patient Instructions  Medication Instructions:  Dr Sallyanne Kuster recommends that you continue on your current medications as directed. Please refer to the Current Medication list given to you today.  If you need a refill on your cardiac medications before your next appointment, please call your pharmacy.   Testing/Procedures: Remote monitoring is used to monitor your Pacemaker or ICD from home. This monitoring reduces the number of office visits required to  check your device to one time per year. It allows [place] to keep an eye on the functioning of your device to ensure it is working properly. You are scheduled for a device check from home on Tuesday, January 28th, 2020. You may send your transmission at any time that day. If you have a wireless device, the transmission will be sent automatically. After your physician reviews your transmission, you will receive a postcard with your next transmission date.  To improve our patient care and to more adequately follow your device, [facility] has decided, as a practice, to start following each patient four times a year with your home monitor. This means that you may experience a remote appointment that is close to an in-office appointment with your physician. Your insurance will apply at the same rate as other remote monitoring transmissions.  Follow-Up: At [facility], you and your health needs are our priority.  As part of our continuing mission to provide you with exceptional heart care, we have created designated Provider Care Teams.  These Care Teams include your primary Cardiologist (physician) and Advanced Practice Providers (APPs -  Physician Assistants and Nurse Practitioners) who all work together to provide you with the care you need, when you need it. You will need a follow up appointment in 6 months.  Please call our office 2 months in advance to schedule this appointment.  You may see Antavius Sperbeck,  MD or one of the following Advanced Practice Providers on your designated Care Team: [place], [place] . Fabian Sharp, PA-C . You will receive a reminder letter in the mail two months in advance. If you don't receive a letter, please call our office to schedule the follow-up appointment.    Signed, Sanda Klein, MD  10/11/2018 3:28 PM    [facility] [place], [place], [place]  [address] Phone: [phone]; Fax: [phone]

## 2018-10-15 DIAGNOSIS — E875 Hyperkalemia: Secondary | ICD-10-CM | POA: Diagnosis not present

## 2018-10-16 LAB — BASIC METABOLIC PANEL
BUN/Creatinine Ratio: 18 (ref 10–24)
BUN: 29 mg/dL — ABNORMAL HIGH (ref 8–27)
CO2: 23 mmol/L (ref 20–29)
Calcium: 9.5 mg/dL (ref 8.6–10.2)
Chloride: 102 mmol/L (ref 96–106)
Creatinine, Ser: 1.64 mg/dL — ABNORMAL HIGH (ref 0.76–1.27)
GFR calc Af Amer: 48 mL/min/{1.73_m2} — ABNORMAL LOW (ref >59)
GFR calc non Af Amer: 42 mL/min/{1.73_m2} — ABNORMAL LOW (ref >59)
Glucose: 119 mg/dL — ABNORMAL HIGH (ref 65–99)
Potassium: 5.1 mmol/L (ref 3.5–5.2)
Sodium: 139 mmol/L (ref 134–144)

## 2018-10-22 LAB — CUP PACEART INCLINIC DEVICE CHECK
Date Time Interrogation Session: 20200121135328
Implantable Lead Implant Date: 20171024
Implantable Lead Implant Date: 20171024
Implantable Lead Implant Date: 20171024
Implantable Lead Location: 753858
Implantable Lead Location: 753859
Implantable Lead Location: 753860
Implantable Lead Model: 4598
Implantable Lead Model: 5076
Implantable Pulse Generator Implant Date: 20171024

## 2018-10-26 ENCOUNTER — Other Ambulatory Visit (HOSPITAL_COMMUNITY): Payer: Self-pay | Admitting: Cardiology

## 2018-10-29 ENCOUNTER — Ambulatory Visit (INDEPENDENT_AMBULATORY_CARE_PROVIDER_SITE_OTHER): Payer: Medicare Other

## 2018-10-29 DIAGNOSIS — I255 Ischemic cardiomyopathy: Secondary | ICD-10-CM | POA: Diagnosis not present

## 2018-10-30 NOTE — Progress Notes (Signed)
Remote ICD transmission.   

## 2018-11-01 LAB — CUP PACEART REMOTE DEVICE CHECK
Battery Remaining Longevity: 61 mo
Brady Statistic AP VP Percent: 50.39 %
Brady Statistic AP VS Percent: 0.86 %
Brady Statistic AS VP Percent: 47.03 %
Brady Statistic AS VS Percent: 1.72 %
Brady Statistic RV Percent Paced: 90.04 %
Date Time Interrogation Session: 20200128163741
HighPow Impedance: 60 Ohm
Implantable Lead Implant Date: 20171024
Implantable Lead Implant Date: 20171024
Implantable Lead Implant Date: 20171024
Implantable Lead Location: 753858
Implantable Lead Location: 753859
Implantable Lead Location: 753860
Implantable Lead Model: 4598
Implantable Lead Model: 5076
Implantable Pulse Generator Implant Date: 20171024
Lead Channel Impedance Value: 212.8 Ohm
Lead Channel Impedance Value: 212.8 Ohm
Lead Channel Impedance Value: 212.8 Ohm
Lead Channel Impedance Value: 228 Ohm
Lead Channel Impedance Value: 342 Ohm
Lead Channel Impedance Value: 399 Ohm
Lead Channel Impedance Value: 418 Ohm
Lead Channel Impedance Value: 456 Ohm
Lead Channel Impedance Value: 456 Ohm
Lead Channel Impedance Value: 456 Ohm
Lead Channel Impedance Value: 722 Ohm
Lead Channel Impedance Value: 722 Ohm
Lead Channel Impedance Value: 760 Ohm
Lead Channel Impedance Value: 817 Ohm
Lead Channel Pacing Threshold Amplitude: 0.375 V
Lead Channel Pacing Threshold Amplitude: 0.875 V
Lead Channel Pacing Threshold Amplitude: 1.125 V
Lead Channel Pacing Threshold Pulse Width: 0.4 ms
Lead Channel Pacing Threshold Pulse Width: 1 ms
Lead Channel Sensing Intrinsic Amplitude: 1 mV
Lead Channel Sensing Intrinsic Amplitude: 11.25 mV
Lead Channel Sensing Intrinsic Amplitude: 11.25 mV
Lead Channel Setting Pacing Amplitude: 2 V
Lead Channel Setting Pacing Amplitude: 2 V
Lead Channel Setting Pacing Amplitude: 2.5 V
Lead Channel Setting Pacing Pulse Width: 0.4 ms
Lead Channel Setting Pacing Pulse Width: 1 ms
Lead Channel Setting Sensing Sensitivity: 0.3 mV
MDC IDC MSMT BATTERY VOLTAGE: 2.97 V
MDC IDC MSMT LEADCHNL LV IMPEDANCE VALUE: 228 Ohm
MDC IDC MSMT LEADCHNL LV IMPEDANCE VALUE: 532 Ohm
MDC IDC MSMT LEADCHNL LV IMPEDANCE VALUE: 722 Ohm
MDC IDC MSMT LEADCHNL RA SENSING INTR AMPL: 1 mV
MDC IDC MSMT LEADCHNL RV IMPEDANCE VALUE: 418 Ohm
MDC IDC MSMT LEADCHNL RV PACING THRESHOLD PULSEWIDTH: 0.4 ms
MDC IDC STAT BRADY RA PERCENT PACED: 47.13 %

## 2018-11-01 NOTE — Telephone Encounter (Signed)
Spoke w/ pt and his wife. Instructed them how to send a manual transmission. I informed her that it could have been a soft wear update causing the monitor to turn on in the middle of the night. Instructed pt wife that if it happened again to call tech support and they would be able to provide more answers. Pt wife verbalized understanding.

## 2018-11-01 NOTE — Telephone Encounter (Signed)
LMOVM for pt to return my call. Provided my direct number.

## 2018-11-09 ENCOUNTER — Other Ambulatory Visit (HOSPITAL_COMMUNITY): Payer: Self-pay | Admitting: Cardiology

## 2018-11-23 ENCOUNTER — Other Ambulatory Visit: Payer: Self-pay | Admitting: Cardiovascular Disease

## 2018-11-30 ENCOUNTER — Other Ambulatory Visit (HOSPITAL_COMMUNITY): Payer: Self-pay | Admitting: Cardiology

## 2018-12-03 ENCOUNTER — Other Ambulatory Visit (HOSPITAL_COMMUNITY): Payer: Self-pay | Admitting: Cardiology

## 2018-12-04 ENCOUNTER — Telehealth (HOSPITAL_COMMUNITY): Payer: Self-pay

## 2018-12-04 NOTE — Telephone Encounter (Signed)
Verified that Lokelma should be taken each day and refil was sent to appropriate pharm

## 2018-12-07 ENCOUNTER — Other Ambulatory Visit (HOSPITAL_COMMUNITY): Payer: Self-pay | Admitting: Cardiology

## 2018-12-14 ENCOUNTER — Other Ambulatory Visit: Payer: Self-pay | Admitting: "Endocrinology

## 2018-12-30 ENCOUNTER — Other Ambulatory Visit (HOSPITAL_COMMUNITY): Payer: Self-pay | Admitting: Cardiology

## 2019-01-22 ENCOUNTER — Telehealth (HOSPITAL_COMMUNITY): Payer: Medicare Other | Admitting: Cardiology

## 2019-01-22 ENCOUNTER — Other Ambulatory Visit: Payer: Self-pay

## 2019-01-22 ENCOUNTER — Ambulatory Visit (HOSPITAL_COMMUNITY)
Admission: RE | Admit: 2019-01-22 | Discharge: 2019-01-22 | Disposition: A | Discharge status: Home / Self Care | Payer: Medicare Other | Source: Ambulatory Visit | Attending: Cardiology | Admitting: Cardiology

## 2019-01-22 ENCOUNTER — Ambulatory Visit (HOSPITAL_COMMUNITY): Payer: Medicare Other

## 2019-01-22 DIAGNOSIS — I5022 Chronic systolic (congestive) heart failure: Secondary | ICD-10-CM

## 2019-01-22 DIAGNOSIS — I483 Typical atrial flutter: Secondary | ICD-10-CM | POA: Diagnosis not present

## 2019-01-22 DIAGNOSIS — I25118 Atherosclerotic heart disease of native coronary artery with other forms of angina pectoris: Secondary | ICD-10-CM

## 2019-01-22 MED ORDER — ISOSORBIDE MONONITRATE ER 30 MG PO TB24: 30.0000 mg | ORAL_TABLET | ORAL | 2 refills | Status: DC | PRN

## 2019-01-22 MED ORDER — FUROSEMIDE 20 MG PO TABS: 20.0000 mg | ORAL_TABLET | ORAL | 6 refills | Status: DC | PRN

## 2019-01-22 NOTE — Progress Notes (Signed)
Spoke to pt and wife about after visit summary review. They are agreeable to lab work and preferred to have it done here. meds refilled. No further needs.

## 2019-01-22 NOTE — Patient Instructions (Signed)
Lab work will need to be done. This is scheduled for Monday April 27th at 10:30am.  REFILLED Furosemide and Imdur  Your physician has requested that you have an echocardiogram. Echocardiography is a painless test that uses sound waves to create images of your heart. It provides your doctor with information about the size and shape of your heart and how well your heart's chambers and valves are working. This procedure takes approximately one hour. There are no restrictions for this procedure. This will be done at your appointment with Dr. Aundra Dubin in 3 months.  Please follow up with Dr. Aundra Dubin in 3 months with an echocardiogram.

## 2019-01-22 NOTE — Progress Notes (Signed)
Heart Failure TeleHealth Note  Due to national recommendations of social distancing due to [place] 19, Audio/video telehealth visit is felt to be most appropriate for this patient at this time.  See MyChart message from today for patient consent regarding telehealth for [facility].  Date:  01/22/2019   ID:  Christian Bowman, DOB 06-Aug-1946, MRN [id number]  Location: Home  Provider location: [place] [facility] Failure Type of Visit: Established patient   PCP:  Sharilyn Sites, MD  Cardiologist:  Loralie Champagne, MD  Chief Complaint: Shortness of breath   History of Present Illness: Christian Bowman is a 72 y.o. male who presents via audio/video conferencing for a telehealth visit today.     he denies symptoms worrisome for COVID 19.   Patient has history of CAD s/p CABG in 2007 then redo CABG in 10/17 with mitral valve replacement.  He was admitted in 10/17 for redo CABG with SVG-PDA and SVG-ramus.  He also had mitral valve replacement with a bioprosthetic valve because of infarct-related mitral regurgitation.  His EF on last study (10/17 TEE) was 20-25%.   Post-operative course was complicated by CHF requiring diuresis.  He also had atrial flutter and required DCCV.  Due to complete heart block, he later got a CRT-D system.    At a prior visit, he was in atrial flutter.  He saw Dr. Curt Bears, it was decided to arrange for DCCV with ablation down the road when PPM leads have been in longer.  He had successful DCCV in 12/17 and is in NSR today.   He was admitted in 3/18 with NSTEMI and chest pain. TnI only 0.5.  LHC showed occlusion of SVG-PDA from CABG#1 to be the likely culprit.  However, SVG-PDA from CABG#2 was patent.  No intervention.  Echo in 3/18 from [place] showed EF 30-35%, stable bioprosthetic mitral valve.   He had atrial flutter ablation in 8/18.  He is in NSR today and is off amiodarone.    In 11/18, he had an inguinal hernia repair. Post-op, he had an NSTEMI.  LHC  showed occlusion of a PLV branch that had been backfilled by SVG-PDA (prior cath had shown severe diffuse disease in the PLV).  Echo in 11/18 showed EF 20-25% with normal bioprosthetic mitral valve.    With no recurrence of atrial arrhythmias, he is now off anticoagulation.   He has been fairly stable.  He has a chronic pattern of chest tightness, generally with emotional stress.  He has used 3 NTGs in a month.  He has not been able to go to cardiac rehab due to coronavirus and has not been exercising much.  He did work in the yard today without significant exertional dyspnea. Weight has been stable.  He has not taken any Lasix.  No orthopnea/PND.  No lightheadedness.    Labs (11/17): K 4.8, creatinine 1.89, hgb 12.3, digoxin 0.9, LFTs normal, TSH 8.235 (mild increase), free T3 low, free T4 normal, LFTs normal.  Labs (12/17): K 4.5, creatinine 1.7, BNP 3909 Labs (3/18): K 3.9, creatinine 1.48, hgb 11.3 Labs (4/18): digoxin 0.5, LFTs normal Labs (7/18): K 5.1, creatinine 1.78, LDL 63, HDL 29, TSH elevated Labs (11/18): K 4.5, creatinine 1.86, hgb 13.6 Labs (12/18): K 5, creatinine 1.88 Labs (1/19): digoxin 0.3, K 4.7, creatinine 1.83 Labs (6/19): LDL 70, HDL 32, K 5.1, creatinine 1.88 Labs (12/19): LDL 60, HDL 31 Labs (1/20): K 5.1, creatinine 1.64  PMH: 1. CAD: CABG 2007.  -  LHC (9/17) with patent LIMA-LAD, totally occluded SVG-D, severe stenosis in SVG-PDA.  - Redo CABG 10/17 with SVG-PDA, SVG-ramus and mitral valve replacement.  - NSTEMI 3/18.  LHC with culprit lesion likely occlusion of SVG-PDA from CABG#1.  Patent SVG-PDA from CABG#2.  No intervention.  - NSTEMI 11/18 post-op inguinal hernia repair.  LHC with occlusion of a PLV branch that had been backfilled by SVG-PDA (prior cath had shown severe diffuse disease in the PLV).  2. Mitral regurgitation: Ischemic MR, mitral valve replacement was done in 10/17 (bioprosthetic). 3. Complete heart block: Post-op in 10/17.  Medtronic CRT-D  placed.  4. Atrial flutter: DCCV 10/17 and again in 12/17.  - Ablation 8/18, now off amiodarone.  5. Type II diabetes 6. Hyperlipidemia 7. Chronic systolic CHF: Ischemic cardiomyopathy.   - TEE (10/17): EF 20-25%, severe LV dilation - Echo (3/18, [place]): EF 30-35%, bioprosthetic mitral valve with mean gradient 5 mmHg.  - Echo (11/18): EF 20-25%, moderate LV dilation, normal bioprosthetic mitral valve.  8. Hypothyroidism  Current Outpatient Medications  Medication Sig Dispense Refill  . ACCU-CHEK SOFTCLIX LANCETS lancets Use as instructed. E11.65 100 each 2  . amoxicillin (AMOXIL) 500 MG tablet Take 4 tablets (2,000 mg total) by mouth as needed. Pre-dental work only. 4 tablet 3  . aspirin EC 81 MG tablet Take 81 mg by mouth daily.     . carvedilol (COREG) 12.5 MG tablet TAKE 1 TABLET BY MOUTH TWICE DAILY WITH A MEAL 60 tablet 5  . furosemide (LASIX) 20 MG tablet Take 1 tablet (20 mg total) by mouth as needed. 15 tablet 6  . isosorbide mononitrate (IMDUR) 30 MG 24 hr tablet Take 1 tablet (30 mg total) by mouth as needed. 45 tablet 2  . levothyroxine (SYNTHROID, LEVOTHROID) 75 MCG tablet TAKE 1 TABLET BY MOUTH ONCE DAILY BEFORE BREAKFAST 90 tablet 0  . LOKELMA 10 g PACK packet DISSOLVE 1 PACKET IN WATER & DRINK ONCE DAILY 90 each 0  . neomycin-bacitracin-polymyxin (NEOSPORIN) ointment Apply 1 application topically daily as needed for wound care.    . nitroGLYCERIN (NITROSTAT) 0.4 MG SL tablet DISSOLVE ONE TABLET UNDER THE TONGUE EVERY 5 MINUTES AS NEEDED FOR CHEST PAIN.  DO NOT EXCEED A TOTAL OF 3 DOSES IN 15 MINUTES 25 tablet 15  . polyethylene glycol (MIRALAX / GLYCOLAX) packet Take 17 g by mouth daily as needed for mild constipation or moderate constipation.    . rosuvastatin (CRESTOR) 10 MG tablet TAKE 1 TABLET BY MOUTH ONCE DAILY IN THE EVENING 90 tablet 1  . sacubitril-valsartan (ENTRESTO) 49-51 MG Take 1 tablet by mouth 2 (two) times daily. 180 tablet 3  . spironolactone (ALDACTONE)  25 MG tablet TAKE 1 TABLET BY MOUTH AT BEDTIME 90 tablet 3  . TRADJENTA 5 MG TABS tablet TAKE 1 TABLET BY MOUTH ONCE DAILY 90 tablet 2   No current facility-administered medications for this encounter.     Allergies:   Xanax [alprazolam]   Social History:  The patient  reports that he has never smoked. He has never used smokeless tobacco. He reports that he does not drink alcohol or use drugs.   Family History:  The patient's family history includes Heart attack in his father and mother; Heart disease in his father, maternal grandmother, and mother; Hypertension in his father and sister.   ROS:  Please see the history of present illness.   All other systems are personally reviewed and negative.   Exam:  (Video/Tele Health Call; Exam is  subjective and or/visual.) General:  Speaks in full sentences. No resp difficulty. Neck: No JVD Lungs: Normal respiratory effort with conversation.  Abdomen: Non-distended per patient report Extremities: Pt denies edema. Neuro: Alert & oriented x 3.   Recent Labs: 10/01/2018: ALT 14; TSH 2.120 10/15/2018: BUN 29; Creatinine, Ser 1.64; Potassium 5.1; Sodium 139  Personally reviewed   Wt Readings from Last 3 Encounters:  10/09/18 87 kg (191 lb 12.8 oz)  10/04/18 86.6 kg (191 lb)  07/05/18 84.5 kg (186 lb 4 oz)      ASSESSMENT AND PLAN:  1. Chronic systolic CHF: Ischemic cardiomyopathy.  TEE 10/17 with EF 20-25%.  Has Medtronic CRT-D system. Most recent echo from 11/18 with EF 20-25%.  Weight is stable, not volume overloaded by exam via video.  NYHA class II.    - He uses Lasix rarely. I will refill for him so he has it at home.     - Continue Entresto 49/51 bid.  Would not increase with history of orthostatic-type symptoms.  - Continue spironolactone to 25 mg daily.   - Continue Coreg 12.5 mg bid.  Would not increase with orthostatic-type symptoms.  - Need to arrange BMET.  He has been on Lokelma chronically to control hyperkalemia and allow use  of spironolactone and Entresto.  - I will arrange for echo at followup in the office in 3 months.  2. CAD: s/p redo CABG.  Admission in 3/18 with NSTEMI, LHC showed occlusion of SVG-PDA from CABG#1 but patent SVG-PDA from CABG#2, no intervention.  He had a post-op NSTEMI after inguinal hernia repair in 11/18.  LHC showed occlusion of a PLV branch that had been backfilled by SVG-PDA (prior cath had shown severe diffuse disease in the PLV). Currently, has atypical chest pain with no change to pattern, occurring about once a week.    - Continue statin => Crestor 10 mg daily. Good lipids in 12/19.  - Restart cardiac rehab maintenance when coronavirus restrictions are lifted.    - Continue ASA 81. - Avoid future elective surgery.  - Refill Imdur.  3. Bioprosthetic mitral valve: Stable on 11/18 echo.  Reassess on echo in 3 months.  4. Atrial flutter: s/p ablation in 8/18.  He is in NSR and off amiodarone.  - Now off warfarin with flutter ablation and no recurrence. If he has recurrence of atrial arrhythmias, based on most recent data DOAC would be a reasonable choice for him even with bioprosthetic MV.  5. CKD: Stage III.  Arrange for BMET.   6. Type II diabetes: He would be a good candidate for an SGLT-2 inhibitor.   COVID screen The patient does not have any symptoms that suggest any further testing/ screening at this time.  Social distancing reinforced today.  Patient Risk: After full review of this patients clinical status, I feel that they are at moderate risk for cardiac decompensation at this time.  Relevant cardiac medications were reviewed at length with the patient today. The patient does not have concerns regarding their medications at this time.   Recommended follow-up:  3 months with echo  Today, I have spent 21 minutes with the patient with telehealth technology discussing the above issues .    Signed, Loralie Champagne, MD  01/22/2019 9:32 PM  [facility]  [address] [facility] [place] [address] [phone] (office) [phone] (fax)

## 2019-01-27 ENCOUNTER — Ambulatory Visit (HOSPITAL_COMMUNITY)
Admission: RE | Admit: 2019-01-27 | Discharge: 2019-01-27 | Disposition: A | Discharge status: Home / Self Care | Payer: Medicare Other | Source: Ambulatory Visit | Attending: Cardiology | Admitting: Cardiology

## 2019-01-27 ENCOUNTER — Other Ambulatory Visit: Payer: Self-pay

## 2019-01-27 DIAGNOSIS — I5022 Chronic systolic (congestive) heart failure: Secondary | ICD-10-CM | POA: Insufficient documentation

## 2019-01-27 LAB — BASIC METABOLIC PANEL
Anion gap: 8 (ref 5–15)
BUN: 27 mg/dL — ABNORMAL HIGH (ref 8–23)
CO2: 27 mmol/L (ref 22–32)
Calcium: 9.4 mg/dL (ref 8.9–10.3)
Chloride: 103 mmol/L (ref 98–111)
Creatinine, Ser: 1.77 mg/dL — ABNORMAL HIGH (ref 0.61–1.24)
GFR calc Af Amer: 44 mL/min — ABNORMAL LOW (ref >60)
GFR calc non Af Amer: 38 mL/min — ABNORMAL LOW (ref >60)
Glucose, Bld: 197 mg/dL — ABNORMAL HIGH (ref 70–99)
Potassium: 4.5 mmol/L (ref 3.5–5.1)
Sodium: 138 mmol/L (ref 135–145)

## 2019-01-28 ENCOUNTER — Ambulatory Visit (INDEPENDENT_AMBULATORY_CARE_PROVIDER_SITE_OTHER): Payer: Medicare Other | Admitting: *Deleted

## 2019-01-28 DIAGNOSIS — I5022 Chronic systolic (congestive) heart failure: Secondary | ICD-10-CM | POA: Diagnosis not present

## 2019-01-28 DIAGNOSIS — I255 Ischemic cardiomyopathy: Secondary | ICD-10-CM

## 2019-01-28 LAB — CUP PACEART REMOTE DEVICE CHECK
Battery Remaining Longevity: 60 mo
Battery Voltage: 2.97 V
Brady Statistic AP VP Percent: 55.12 %
Brady Statistic AP VS Percent: 0.89 %
Brady Statistic AS VP Percent: 41.76 %
Brady Statistic AS VS Percent: 2.23 %
Brady Statistic RA Percent Paced: 49.56 %
Brady Statistic RV Percent Paced: 88.19 %
Date Time Interrogation Session: 20200428083828
HighPow Impedance: 57 Ohm
Implantable Lead Implant Date: 20171024
Implantable Lead Implant Date: 20171024
Implantable Lead Implant Date: 20171024
Implantable Lead Location: 753858
Implantable Lead Location: 753859
Implantable Lead Location: 753860
Implantable Lead Model: 4598
Implantable Lead Model: 5076
Implantable Pulse Generator Implant Date: 20171024
Lead Channel Impedance Value: 222.34 Ohm
Lead Channel Impedance Value: 222.34 Ohm
Lead Channel Impedance Value: 230.327
Lead Channel Impedance Value: 246.635
Lead Channel Impedance Value: 246.635
Lead Channel Impedance Value: 342 Ohm
Lead Channel Impedance Value: 418 Ohm
Lead Channel Impedance Value: 418 Ohm
Lead Channel Impedance Value: 456 Ohm
Lead Channel Impedance Value: 475 Ohm
Lead Channel Impedance Value: 475 Ohm
Lead Channel Impedance Value: 513 Ohm
Lead Channel Impedance Value: 551 Ohm
Lead Channel Impedance Value: 779 Ohm
Lead Channel Impedance Value: 817 Ohm
Lead Channel Impedance Value: 817 Ohm
Lead Channel Impedance Value: 836 Ohm
Lead Channel Impedance Value: 874 Ohm
Lead Channel Pacing Threshold Amplitude: 0.5 V
Lead Channel Pacing Threshold Amplitude: 0.75 V
Lead Channel Pacing Threshold Amplitude: 0.875 V
Lead Channel Pacing Threshold Pulse Width: 0.4 ms
Lead Channel Pacing Threshold Pulse Width: 0.4 ms
Lead Channel Pacing Threshold Pulse Width: 1 ms
Lead Channel Sensing Intrinsic Amplitude: 1.5 mV
Lead Channel Sensing Intrinsic Amplitude: 1.5 mV
Lead Channel Sensing Intrinsic Amplitude: 13.125 mV
Lead Channel Sensing Intrinsic Amplitude: 13.125 mV
Lead Channel Setting Pacing Amplitude: 1.5 V
Lead Channel Setting Pacing Amplitude: 2 V
Lead Channel Setting Pacing Amplitude: 2 V
Lead Channel Setting Pacing Pulse Width: 0.4 ms
Lead Channel Setting Pacing Pulse Width: 1 ms
Lead Channel Setting Sensing Sensitivity: 0.3 mV

## 2019-02-03 ENCOUNTER — Encounter (HOSPITAL_COMMUNITY): Payer: Self-pay

## 2019-02-06 NOTE — Progress Notes (Signed)
Remote ICD transmission.   

## 2019-02-13 ENCOUNTER — Other Ambulatory Visit (HOSPITAL_COMMUNITY): Payer: Self-pay | Admitting: Cardiology

## 2019-03-15 ENCOUNTER — Other Ambulatory Visit: Payer: Self-pay | Admitting: "Endocrinology

## 2019-04-07 ENCOUNTER — Other Ambulatory Visit (HOSPITAL_COMMUNITY): Payer: Self-pay | Admitting: Cardiology

## 2019-04-07 ENCOUNTER — Ambulatory Visit: Payer: Medicare Other | Admitting: "Endocrinology

## 2019-04-28 ENCOUNTER — Ambulatory Visit (HOSPITAL_BASED_OUTPATIENT_CLINIC_OR_DEPARTMENT_OTHER)
Admission: RE | Admit: 2019-04-28 | Discharge: 2019-04-28 | Disposition: A | Discharge status: Home / Self Care | Payer: Medicare Other | Source: Ambulatory Visit | Attending: Cardiology | Admitting: Cardiology

## 2019-04-28 ENCOUNTER — Ambulatory Visit (HOSPITAL_COMMUNITY)
Admission: RE | Admit: 2019-04-28 | Discharge: 2019-04-28 | Disposition: A | Discharge status: Home / Self Care | Payer: Medicare Other | Source: Ambulatory Visit | Attending: Family Medicine | Admitting: Family Medicine

## 2019-04-28 ENCOUNTER — Other Ambulatory Visit: Payer: Self-pay

## 2019-04-28 ENCOUNTER — Encounter (HOSPITAL_COMMUNITY): Payer: Self-pay | Admitting: Cardiology

## 2019-04-28 VITALS — BP 118/84 | HR 91 | Wt 195.4 lb

## 2019-04-28 DIAGNOSIS — I442 Atrioventricular block, complete: Secondary | ICD-10-CM | POA: Diagnosis not present

## 2019-04-28 DIAGNOSIS — I5022 Chronic systolic (congestive) heart failure: Secondary | ICD-10-CM | POA: Diagnosis not present

## 2019-04-28 DIAGNOSIS — E785 Hyperlipidemia, unspecified: Secondary | ICD-10-CM | POA: Insufficient documentation

## 2019-04-28 DIAGNOSIS — Z953 Presence of xenogenic heart valve: Secondary | ICD-10-CM

## 2019-04-28 DIAGNOSIS — Z7982 Long term (current) use of aspirin: Secondary | ICD-10-CM | POA: Insufficient documentation

## 2019-04-28 DIAGNOSIS — R9431 Abnormal electrocardiogram [ECG] [EKG]: Secondary | ICD-10-CM | POA: Diagnosis not present

## 2019-04-28 DIAGNOSIS — E039 Hypothyroidism, unspecified: Secondary | ICD-10-CM | POA: Diagnosis not present

## 2019-04-28 DIAGNOSIS — I25810 Atherosclerosis of coronary artery bypass graft(s) without angina pectoris: Secondary | ICD-10-CM | POA: Diagnosis not present

## 2019-04-28 DIAGNOSIS — Z950 Presence of cardiac pacemaker: Secondary | ICD-10-CM | POA: Insufficient documentation

## 2019-04-28 DIAGNOSIS — I4891 Unspecified atrial fibrillation: Secondary | ICD-10-CM | POA: Diagnosis not present

## 2019-04-28 DIAGNOSIS — I340 Nonrheumatic mitral (valve) insufficiency: Secondary | ICD-10-CM | POA: Insufficient documentation

## 2019-04-28 DIAGNOSIS — Z951 Presence of aortocoronary bypass graft: Secondary | ICD-10-CM | POA: Diagnosis not present

## 2019-04-28 DIAGNOSIS — E1122 Type 2 diabetes mellitus with diabetic chronic kidney disease: Secondary | ICD-10-CM | POA: Insufficient documentation

## 2019-04-28 DIAGNOSIS — Z888 Allergy status to other drugs, medicaments and biological substances status: Secondary | ICD-10-CM | POA: Diagnosis not present

## 2019-04-28 DIAGNOSIS — I252 Old myocardial infarction: Secondary | ICD-10-CM | POA: Insufficient documentation

## 2019-04-28 DIAGNOSIS — Z8249 Family history of ischemic heart disease and other diseases of the circulatory system: Secondary | ICD-10-CM | POA: Insufficient documentation

## 2019-04-28 DIAGNOSIS — E1159 Type 2 diabetes mellitus with other circulatory complications: Secondary | ICD-10-CM

## 2019-04-28 DIAGNOSIS — E875 Hyperkalemia: Secondary | ICD-10-CM | POA: Insufficient documentation

## 2019-04-28 DIAGNOSIS — I25118 Atherosclerotic heart disease of native coronary artery with other forms of angina pectoris: Secondary | ICD-10-CM | POA: Diagnosis not present

## 2019-04-28 DIAGNOSIS — I4892 Unspecified atrial flutter: Secondary | ICD-10-CM | POA: Insufficient documentation

## 2019-04-28 DIAGNOSIS — Z79899 Other long term (current) drug therapy: Secondary | ICD-10-CM | POA: Diagnosis not present

## 2019-04-28 DIAGNOSIS — Z79890 Hormone replacement therapy (postmenopausal): Secondary | ICD-10-CM | POA: Diagnosis not present

## 2019-04-28 DIAGNOSIS — N183 Chronic kidney disease, stage 3 (moderate): Secondary | ICD-10-CM | POA: Insufficient documentation

## 2019-04-28 DIAGNOSIS — I255 Ischemic cardiomyopathy: Secondary | ICD-10-CM | POA: Diagnosis not present

## 2019-04-28 LAB — COMPREHENSIVE METABOLIC PANEL
ALT: 22 U/L (ref 0–44)
AST: 17 U/L (ref 15–41)
Albumin: 3.8 g/dL (ref 3.5–5.0)
Alkaline Phosphatase: 61 U/L (ref 38–126)
Anion gap: 9 (ref 5–15)
BUN: 38 mg/dL — ABNORMAL HIGH (ref 8–23)
CO2: 22 mmol/L (ref 22–32)
Calcium: 9 mg/dL (ref 8.9–10.3)
Chloride: 105 mmol/L (ref 98–111)
Creatinine, Ser: 1.7 mg/dL — ABNORMAL HIGH (ref 0.61–1.24)
GFR calc Af Amer: 46 mL/min — ABNORMAL LOW (ref >60)
GFR calc non Af Amer: 40 mL/min — ABNORMAL LOW (ref >60)
Glucose, Bld: 159 mg/dL — ABNORMAL HIGH (ref 70–99)
Potassium: 5 mmol/L (ref 3.5–5.1)
Sodium: 136 mmol/L (ref 135–145)
Total Bilirubin: 0.7 mg/dL (ref 0.3–1.2)
Total Protein: 7.1 g/dL (ref 6.5–8.1)

## 2019-04-28 LAB — HEMOGLOBIN A1C
Hgb A1c MFr Bld: 7 % — ABNORMAL HIGH (ref 4.8–5.6)
Mean Plasma Glucose: 154.2 mg/dL

## 2019-04-28 LAB — TSH: TSH: 1.963 u[IU]/mL (ref 0.350–4.500)

## 2019-04-28 LAB — T4, FREE: Free T4: 1.08 ng/dL (ref 0.61–1.12)

## 2019-04-28 MED ORDER — EPLERENONE 25 MG PO TABS: 25.0000 mg | ORAL_TABLET | Freq: Every day | ORAL | 6 refills | Status: DC

## 2019-04-28 NOTE — Patient Instructions (Signed)
Labs today We will only contact you if something comes back abnormal or we need to make some changes. Otherwise no news is good news!  REPEAT LABS IN 3 WEEKS   STOP Spironolactone.  START Eplerenone 25mg  (1 tab) in 1 week (August 3rd, 2020  DECREASE Lokelma to [place] until Eplerenone is started, THEN back to daily.  Your physician recommends that you schedule a follow-up appointment in: 3 months with Dr Aundra Dubin, you will get a call to schedule this appointment.   .At the [facility], you and your health needs are our priority. As part of our continuing mission to provide you with exceptional heart care, we have created designated Provider Care Teams. These Care Teams include your primary Cardiologist (physician) and Advanced Practice Providers (APPs- Physician Assistants and Nurse Practitioners) who all work together to provide you with the care you need, when you need it.   You may see any of the following providers on your designated Care Team at your next follow up: [name] Dr Glori Bickers . Dr Loralie Champagne . Darrick Grinder, NP

## 2019-04-29 ENCOUNTER — Ambulatory Visit (INDEPENDENT_AMBULATORY_CARE_PROVIDER_SITE_OTHER): Payer: Medicare Other | Admitting: *Deleted

## 2019-04-29 DIAGNOSIS — I255 Ischemic cardiomyopathy: Secondary | ICD-10-CM

## 2019-04-29 LAB — CUP PACEART REMOTE DEVICE CHECK
Battery Remaining Longevity: 46 mo
Battery Voltage: 2.97 V
Brady Statistic AP VP Percent: 63.39 %
Brady Statistic AP VS Percent: 0.99 %
Brady Statistic AS VP Percent: 34.16 %
Brady Statistic AS VS Percent: 1.45 %
Brady Statistic RA Percent Paced: 57.97 %
Brady Statistic RV Percent Paced: 90.06 %
Date Time Interrogation Session: 20200728083826
HighPow Impedance: 67 Ohm
Implantable Lead Implant Date: 20171024
Implantable Lead Implant Date: 20171024
Implantable Lead Implant Date: 20171024
Implantable Lead Location: 753858
Implantable Lead Location: 753859
Implantable Lead Location: 753860
Implantable Lead Model: 4598
Implantable Lead Model: 5076
Implantable Pulse Generator Implant Date: 20171024
Lead Channel Impedance Value: 232.653
Lead Channel Impedance Value: 241.412
Lead Channel Impedance Value: 245.538
Lead Channel Impedance Value: 250.943
Lead Channel Impedance Value: 261.164
Lead Channel Impedance Value: 361 Ohm
Lead Channel Impedance Value: 418 Ohm
Lead Channel Impedance Value: 456 Ohm
Lead Channel Impedance Value: 456 Ohm
Lead Channel Impedance Value: 475 Ohm
Lead Channel Impedance Value: 513 Ohm
Lead Channel Impedance Value: 532 Ohm
Lead Channel Impedance Value: 551 Ohm
Lead Channel Impedance Value: 779 Ohm
Lead Channel Impedance Value: 779 Ohm
Lead Channel Impedance Value: 836 Ohm
Lead Channel Impedance Value: 874 Ohm
Lead Channel Impedance Value: 893 Ohm
Lead Channel Pacing Threshold Amplitude: 0.375 V
Lead Channel Pacing Threshold Amplitude: 0.875 V
Lead Channel Pacing Threshold Amplitude: 1.125 V
Lead Channel Pacing Threshold Pulse Width: 0.4 ms
Lead Channel Pacing Threshold Pulse Width: 0.4 ms
Lead Channel Pacing Threshold Pulse Width: 1 ms
Lead Channel Sensing Intrinsic Amplitude: 0.5 mV
Lead Channel Sensing Intrinsic Amplitude: 0.5 mV
Lead Channel Sensing Intrinsic Amplitude: 13.375 mV
Lead Channel Sensing Intrinsic Amplitude: 13.375 mV
Lead Channel Setting Pacing Amplitude: 2 V
Lead Channel Setting Pacing Amplitude: 2 V
Lead Channel Setting Pacing Amplitude: 2.75 V
Lead Channel Setting Pacing Pulse Width: 0.4 ms
Lead Channel Setting Pacing Pulse Width: 1 ms
Lead Channel Setting Sensing Sensitivity: 0.3 mV

## 2019-04-29 MED ORDER — EPLERENONE 25 MG PO TABS: 25.0000 mg | ORAL_TABLET | Freq: Every day | ORAL | 0 refills | Status: DC

## 2019-04-29 NOTE — Progress Notes (Signed)
Date:  04/29/2019   ID:  [name], DOB Dec 23, 1945, MRN [id number]   Provider location:  Advanced Heart Failure Type of Visit: Established patient   PCP:  Sharilyn Sites, MD  Cardiologist:  Loralie Champagne, MD  Chief Complaint: Shortness of breath   History of Present Illness: Christian Bowman is a 73 y.o. male with history of CAD s/p CABG in 2007 then redo CABG in 10/17 with mitral valve replacement.  He was admitted in 10/17 for redo CABG with SVG-PDA and SVG-ramus.  He also had mitral valve replacement with a bioprosthetic valve because of infarct-related mitral regurgitation.  Post-operative course was complicated by CHF requiring diuresis.  He also had atrial flutter and required DCCV.  Due to complete heart block, he later got a CRT-D system.    At a prior visit, he was in atrial flutter.  He saw Dr. Curt Bears, it was decided to arrange for DCCV with ablation down the road when PPM leads have been in longer.  He had successful DCCV in 12/17 and is in NSR today.   He was admitted in 3/18 with NSTEMI and chest pain. TnI only 0.5.  LHC showed occlusion of SVG-PDA from CABG#1 to be the likely culprit.  However, SVG-PDA from CABG#2 was patent.  No intervention.  Echo in 3/18 from [place] showed EF 30-35%, stable bioprosthetic mitral valve.   He had atrial flutter ablation in 8/18.  He is in NSR today and is off amiodarone.    In 11/18, he had an inguinal hernia repair. Post-op, he had an NSTEMI.  LHC showed occlusion of a PLV branch that had been backfilled by SVG-PDA (prior cath had shown severe diffuse disease in the PLV).  Echo in 11/18 showed EF 20-25% with normal bioprosthetic mitral valve.    With no recurrence of atrial arrhythmias, he is now off anticoagulation.   Echo was done today and reviewed.  EF 25-30%, bioprosthetic MV with no MR and mean gradient 5 mmHg, normal RV.   He returns for followup of CHF. Weight is up 9 lbs, which he attributes to lack of exercise  (cardiac rehab closed, he had been doing maintenance regularly).  He has a hard time exercising on his own and enjoys exercising with others.   He has not had recent chest pain.  He rarely takes Lasix.  No dyspnea with ADLs.  No problems with a flight of stairs or walking on flat ground.  Able to wash his car this weekend without problems.  Of note, he has developed painful gynecomastia bilaterally.  He says that this has been present for almost a year now but he had not mentioned it before as it was not "too bad."   ECG (personally reviewed): NSR, BiV pacing with frequent PACs  Medtronic device interrogation: Thoracic impedance stable.    Labs (11/17): K 4.8, creatinine 1.89, hgb 12.3, digoxin 0.9, LFTs normal, TSH 8.235 (mild increase), free T3 low, free T4 normal, LFTs normal.  Labs (12/17): K 4.5, creatinine 1.7, BNP 3909 Labs (3/18): K 3.9, creatinine 1.48, hgb 11.3 Labs (4/18): digoxin 0.5, LFTs normal Labs (7/18): K 5.1, creatinine 1.78, LDL 63, HDL 29, TSH elevated Labs (11/18): K 4.5, creatinine 1.86, hgb 13.6 Labs (12/18): K 5, creatinine 1.88 Labs (1/19): digoxin 0.3, K 4.7, creatinine 1.83 Labs (6/19): LDL 70, HDL 32, K 5.1, creatinine 1.88 Labs (12/19): LDL 60, HDL 31 Labs (1/20): K 5.1, creatinine 1.64 Labs (4/20): K 4.5, creatinine 1.77  PMH: 1. CAD: CABG 2007.  - LHC (9/17) with patent LIMA-LAD, totally occluded SVG-D, severe stenosis in SVG-PDA.  - Redo CABG 10/17 with SVG-PDA, SVG-ramus and mitral valve replacement.  - NSTEMI 3/18.  LHC with culprit lesion likely occlusion of SVG-PDA from CABG#1.  Patent SVG-PDA from CABG#2.  No intervention.  - NSTEMI 11/18 post-op inguinal hernia repair.  LHC with occlusion of a PLV branch that had been backfilled by SVG-PDA (prior cath had shown severe diffuse disease in the PLV).  2. Mitral regurgitation: Ischemic MR, mitral valve replacement was done in 10/17 (bioprosthetic). 3. Complete heart block: Post-op in 10/17.  Medtronic  CRT-D placed.  4. Atrial flutter: DCCV 10/17 and again in 12/17.  - Ablation 8/18, now off amiodarone.  5. Type II diabetes 6. Hyperlipidemia 7. Chronic systolic CHF: Ischemic cardiomyopathy.   - TEE (10/17): EF 20-25%, severe LV dilation - Echo (3/18, [place]): EF 30-35%, bioprosthetic mitral valve with mean gradient 5 mmHg.  - Echo (11/18): EF 20-25%, moderate LV dilation, normal bioprosthetic mitral valve.  - Echo (7/20): EF 25-30%, moderate LV dilation, inferior/inferolateral AK, bioprosthetic MV with mean gradient 5 and no MR, normal RV.  - Painful gynecomastia with spironolactone.  8. Hypothyroidism  Current Outpatient Medications  Medication Sig Dispense Refill  . ACCU-CHEK SOFTCLIX LANCETS lancets Use as instructed. E11.65 100 each 2  . amoxicillin (AMOXIL) 500 MG tablet Take 4 tablets (2,000 mg total) by mouth as needed. Pre-dental work only. 4 tablet 3  . aspirin EC 81 MG tablet Take 81 mg by mouth daily.     . carvedilol (COREG) 12.5 MG tablet TAKE 1 TABLET BY MOUTH TWICE DAILY WITH A MEAL 60 tablet 5  . furosemide (LASIX) 20 MG tablet Take 1 tablet (20 mg total) by mouth as needed. 15 tablet 6  . isosorbide mononitrate (IMDUR) 30 MG 24 hr tablet Take 1 tablet (30 mg total) by mouth as needed. 45 tablet 2  . levothyroxine (SYNTHROID) 75 MCG tablet TAKE 1 TABLET BY MOUTH ONCE DAILY BEFORE BREAKFAST 90 tablet 0  . LOKELMA 10 g PACK packet DISSOLVE 1 PACKET IN WATER & DRINK ONCE DAILY 90 each 0  . neomycin-bacitracin-polymyxin (NEOSPORIN) ointment Apply 1 application topically daily as needed for wound care.    . nitroGLYCERIN (NITROSTAT) 0.4 MG SL tablet DISSOLVE ONE TABLET UNDER THE TONGUE EVERY 5 MINUTES AS NEEDED FOR CHEST PAIN.  DO NOT EXCEED A TOTAL OF 3 DOSES IN 15 MINUTES 25 tablet 0  . polyethylene glycol (MIRALAX / GLYCOLAX) packet Take 17 g by mouth daily as needed for mild constipation or moderate constipation.    . rosuvastatin (CRESTOR) 10 MG tablet TAKE 1 TABLET BY  MOUTH ONCE DAILY IN THE EVENING 90 tablet 1  . sacubitril-valsartan (ENTRESTO) 49-51 MG Take 1 tablet by mouth 2 (two) times daily. 180 tablet 3  . eplerenone (INSPRA) 25 MG tablet Take 1 tablet (25 mg total) by mouth daily for 7 days. 7 tablet 0  . [START ON 05/05/2019] eplerenone (INSPRA) 25 MG tablet Take 1 tablet (25 mg total) by mouth daily. 30 tablet 6   No current facility-administered medications for this encounter.     Allergies:   Xanax [alprazolam]   Social History:  The patient  reports that he has never smoked. He has never used smokeless tobacco. He reports that he does not drink alcohol or use drugs.   Family History:  The patient's family history includes Heart attack in his father and mother; Heart  disease in his father, maternal grandmother, and mother; Hypertension in his father and sister.   ROS:  Please see the history of present illness.   All other systems are personally reviewed and negative.   Exam:   BP 118/84   Pulse 91   Wt 88.6 kg (195 lb 6.4 oz)   SpO2 97%   BMI 26.50 kg/m  General: NAD Neck: No JVD, no thyromegaly or thyroid nodule.  Lungs: Clear to auscultation bilaterally with normal respiratory effort. CV: Nondisplaced PMI.  Heart regular S1/S2, no S3/S4, no murmur.  No peripheral edema.  No carotid bruit.  Normal pedal pulses.  Abdomen: Soft, nontender, no hepatosplenomegaly, no distention.  Skin: Intact without lesions or rashes.  Neurologic: Alert and oriented x 3.  Psych: Normal affect. Extremities: No clubbing or cyanosis.  HEENT: Normal.   Recent Labs: 04/28/2019: ALT 22; BUN 38; Creatinine, Ser 1.70; Potassium 5.0; Sodium 136; TSH 1.963  Personally reviewed   Wt Readings from Last 3 Encounters:  04/28/19 88.6 kg (195 lb 6.4 oz)  10/09/18 87 kg (191 lb 12.8 oz)  10/04/18 86.6 kg (191 lb)      ASSESSMENT AND PLAN:  1. Chronic systolic CHF: Ischemic cardiomyopathy.  TEE 10/17 with EF 20-25%.  Has Medtronic CRT-D system. Today's echo  was reviewed, showed EF 25-30% with normal RV.  Weight is up but I think this is due to lack of exercise and not volume retention.  He is not volume overloaded by exam or Optivol.  - He uses Lasix rarely.    - Continue Entresto 49/51 bid.  Would not increase with history of orthostatic-type symptoms.  - I suspect spironolactone is the cause of bilateral painful gynecomastia.  I will have him stop spironolactone.  After 1 week, he will start eplerenone 25 mg daily.  BMET today and again in 3 wks.    - Continue Coreg 12.5 mg bid.  Would not increase with orthostatic-type symptoms.  - He has been on Lokelma chronically to control hyperkalemia and allow use of spironolactone and Entresto.  When he stops spironolactone, he will decrease Lokelma to every other day.  When he starts eplerenone, can increase Lokelma back to qd.  BMETs as above.  2. CAD: s/p redo CABG.  Admission in 3/18 with NSTEMI, LHC showed occlusion of SVG-PDA from CABG#1 but patent SVG-PDA from CABG#2, no intervention.  He had a post-op NSTEMI after inguinal hernia repair in 11/18.  LHC showed occlusion of a PLV branch that had been backfilled by SVG-PDA (prior cath had shown severe diffuse disease in the PLV). No recent chest pain.     - Continue statin => Crestor 10 mg daily. Good lipids in 12/19.  - Restart cardiac rehab maintenance when coronavirus restrictions are lifted.   I encouraged him to exercise more on his own, may help to exercise with his wife.  - Continue ASA 81. - Avoid future elective surgery.  3. Bioprosthetic mitral valve: Stable on echo today.  4. Atrial flutter: s/p ablation in 8/18.  He is in NSR and off amiodarone.  - Now off warfarin with flutter ablation and no recurrence. If he has recurrence of atrial arrhythmias, based on most recent data DOAC would be a reasonable choice for him even with bioprosthetic MV.  5. CKD: Stage III.  Arrange for BMET.   6. Type II diabetes: He would be a good candidate for an SGLT-2  inhibitor.   Signed, Loralie Champagne, MD  04/29/2019  [facility]  [place] [address] [phone] (office) [phone] (fax)

## 2019-05-01 ENCOUNTER — Telehealth (HOSPITAL_COMMUNITY): Payer: Self-pay | Admitting: Pharmacy Technician

## 2019-05-01 NOTE — Telephone Encounter (Signed)
Received notification from [employer] that prior authorization for Eplerenone is required.  PA submitted on CoverMyMeds Key [id number] Status is pending  Charlann Boxer, CPhT

## 2019-05-02 ENCOUNTER — Telehealth: Payer: Self-pay

## 2019-05-02 NOTE — Telephone Encounter (Signed)
-----   Message from Will Meredith Leeds, MD sent at 05/01/2019  2:39 PM EDT ----- Abnormal device interrogation reviewed.  Lead parameters and battery status stable.  EGMs with farfield A with AF seen. Needs to come into clinic to adjust blanking period

## 2019-05-05 ENCOUNTER — Encounter: Payer: Self-pay | Admitting: Physician Assistant

## 2019-05-05 ENCOUNTER — Encounter: Payer: Self-pay | Admitting: "Endocrinology

## 2019-05-05 ENCOUNTER — Telehealth: Payer: Medicare Other | Admitting: Physician Assistant

## 2019-05-05 ENCOUNTER — Telehealth (INDEPENDENT_AMBULATORY_CARE_PROVIDER_SITE_OTHER): Payer: Medicare Other | Admitting: "Endocrinology

## 2019-05-05 ENCOUNTER — Ambulatory Visit: Payer: Medicare Other | Admitting: "Endocrinology

## 2019-05-05 ENCOUNTER — Other Ambulatory Visit: Payer: Self-pay

## 2019-05-05 DIAGNOSIS — N183 Chronic kidney disease, stage 3 (moderate): Secondary | ICD-10-CM | POA: Diagnosis not present

## 2019-05-05 DIAGNOSIS — I255 Ischemic cardiomyopathy: Secondary | ICD-10-CM

## 2019-05-05 DIAGNOSIS — I10 Essential (primary) hypertension: Secondary | ICD-10-CM

## 2019-05-05 DIAGNOSIS — E039 Hypothyroidism, unspecified: Secondary | ICD-10-CM | POA: Diagnosis not present

## 2019-05-05 DIAGNOSIS — E782 Mixed hyperlipidemia: Secondary | ICD-10-CM | POA: Diagnosis not present

## 2019-05-05 DIAGNOSIS — E1122 Type 2 diabetes mellitus with diabetic chronic kidney disease: Secondary | ICD-10-CM

## 2019-05-05 NOTE — Telephone Encounter (Signed)
Pt scheduled for DC on 05/08/19.

## 2019-05-05 NOTE — Progress Notes (Signed)
Called and left Voicemail to ask if you would like treatment for motion sickness, as you stated otherwise on the questionnaire. Please submit another Evisit if you are seeking medical advice from [place].    NOTE: If you entered your credit card information for this eVisit, you will not be charged. You may see a "hold" on your card for the $35 but that hold will drop off and you will not have a charge processed.  If you are having a true medical emergency please call 911.     For an urgent face to face visit, [facility] has five urgent care centers for your convenience:    [email or web address] to reserve your spot online an avoid wait times  [facility] (New Address!) [address], [place], [place] [address] *Just off [address], across the road from [place] hours of operation: Monday-Friday, 12 PM to 6 PM  Closed Saturday & Sunday   The following sites will take your insurance:  . [facility]    [phone]                  Get Driving Directions  [address], [place] [address] . 10 am to 8 pm Monday-Friday . 12 pm to 8 pm Saturday-Sunday   . [facility] at [place]                  Get Driving Directions  [address], [place], [place] [address] . 8 am to 8 pm Monday-Friday . 9 am to 6 pm Saturday . 11 am to 6 pm Sunday   . [facility] at [place]                  Get Driving Directions   [address] [place], [place] [address] . 8 am to 8 pm Monday-Friday . 8 am to 4 pm Saturday-Sunday    . [facility] at [place]                    Get Driving Directions  [phone]  [address], [place], [place] [address]  . Monday-Friday, 12 PM to 6 PM    Your e-visit answers were reviewed by a board certified advanced clinical practitioner to complete your  personal care plan.  Thank you for using e-Visits. I spent 5-10 minutes on review and completion of this note- Lacy Duverney [facility]

## 2019-05-05 NOTE — Progress Notes (Signed)
Endocrinology follow-up note  Subjective:    Patient ID: Christian Bowman, male    DOB: Dec 07, 1945,    Past Medical History:  Diagnosis Date  . AICD (automatic cardioverter/defibrillator) present    medtronic-   DR. CROITORU , DR. Aundra Dubin   . Anginal pain ([place])    cp sat 08/11/17  . Anxiety   . CAD (coronary artery disease)   . CHF (congestive heart failure) ([place])   . Complication of anesthesia    took awhile to wake up   . Coronary artery disease involving coronary bypass graft   . Cyst of neck    right side  . DM2 (diabetes mellitus, type 2) ([place]) 08/26/2013  . Dyspnea   . Heart attack ([place])    "not sure when" (08/20/2017)  . HTN (hypertension) 08/26/2013  . Hyperlipidemia 08/26/2013  . Hypothyroidism   . Left main coronary artery disease   . Left renal artery stenosis ([place])    Genesis 6x12 stent 2007  . Obesity (BMI 30.0-34.9) 08/26/2013  . Postoperative atrial fibrillation ([place]) 10/15/2005  . Presence of permanent cardiac pacemaker   . S/P CABG x 4 10/13/2005   LIMA to LAD, SVG to intermediate branch, sequential SVG to PDA and RPL branch, EVH via right thigh  . S/P mitral valve replacement with bioprosthetic valve 07/11/2016   31 mm [facility] Mitral bovine bioprosthetic tissue valve  . S/P redo CABG x 2 07/11/2016   SVG to PDA and SVG to Intermediate Branch, EVH via left thigh   Past Surgical History:  Procedure Laterality Date  . A-FLUTTER ABLATION N/A 05/11/2017   Procedure: A-Flutter Ablation;  Surgeon: Constance Haw, MD;  Location: [place] [facility];  Service: Cardiovascular;  Laterality: N/A;  . CARDIAC CATHETERIZATION N/A 06/21/2016   Procedure: Right/Left Heart Cath and Coronary/Graft Angiography;  Surgeon: Sherren Mocha, MD;  Location: [place] [facility];  Service: Cardiovascular;  Laterality: N/A;  . CARDIAC VALVE REPLACEMENT    . CARDIOVERSION N/A 07/19/2016   Procedure: CARDIOVERSION;  Surgeon: Lelon Perla, MD;  Location: [facility] ENDOSCOPY;   Service: Cardiovascular;  Laterality: N/A;  . CARDIOVERSION N/A 09/08/2016   Procedure: CARDIOVERSION;  Surgeon: Fay Records, MD;  Location: [place];  Service: Cardiovascular;  Laterality: N/A;  . CORONARY ANGIOPLASTY     STENT 2016  [place]     DES in SVG to right coronary artery system  . CORONARY ARTERY BYPASS GRAFT  10/13/2005   LIMA to LAD, SVG to intermediate branch, sequential SVG to PDA and RPL  . CORONARY ARTERY BYPASS GRAFT N/A 07/11/2016   Procedure: REDO CORONARY ARTERY BYPASS GRAFTING (CABG) x two using left leg greater saphenous vein harvested endoscopically-SVG to PDA -SVG to RAMUS INTERMEDIATE;  Surgeon: Rexene Alberts, MD;  Location: [place];  Service: Open Heart Surgery;  Laterality: N/A;  . CORONARY ARTERY BYPASS GRAFT N/A 07/11/2016   Procedure: Re-exploration (CABG) for post op bleeding,;  Surgeon: Rexene Alberts, MD;  Location: [place];  Service: Open Heart Surgery;  Laterality: N/A;  . EP IMPLANTABLE DEVICE N/A 07/25/2016   Procedure: BiV ICD Insertion CRT-D;  Surgeon: Will Meredith Leeds, MD;  Location: [place] [facility];  Service: Cardiovascular;  Laterality: N/A;  . INGUINAL HERNIA REPAIR Left 08/20/2017  . INGUINAL HERNIA REPAIR Left 08/20/2017   Procedure: OPEN LEFT INGUINAL HERNIA REPAIR;  Surgeon: Alphonsa Overall, MD;  Location: [place];  Service: General;  Laterality: Left;  .  LEFT HEART CATH AND CORS/GRAFTS ANGIOGRAPHY N/A 12/18/2016   Procedure: Left Heart Cath and Cors/Grafts Angiography;  Surgeon: Sherren Mocha, MD;  Location: [place] [facility];  Service: Cardiovascular;  Laterality: N/A;  . LEFT HEART CATH AND CORS/GRAFTS ANGIOGRAPHY N/A 08/22/2017   Procedure: LEFT HEART CATH AND CORS/GRAFTS ANGIOGRAPHY;  Surgeon: Larey Dresser, MD;  Location: [place] [facility];  Service: Cardiovascular;  Laterality: N/A;  . MITRAL VALVE REPLACEMENT N/A 07/11/2016   Procedure: MITRAL VALVE (MV) REPLACEMENT;   Surgeon: Rexene Alberts, MD;  Location: [place];  Service: Open Heart Surgery;  Laterality: N/A;  . MYOCARDICAL PERFUSION  10/09/2007   NORMAL PERFUSION IN ALL REGIONS;NO EVIDENCE OF INDUCIBLE ISCHEMIA;POST STRESS EF% 66  . RENAL ARTERY STENT Right 2007  . RENAL DOPPLER  03/28/2010   RIGHT RA-NORMAL;LEFT PROXIMAL RA AT STENT-PATENT WITH NO EVIDENCE OF SIGN DIAMETER REDUCTION. R & L KIDNEYS: EQUAL IN SIZE,SYMMETRICAL IN SHAPE.  . TEE WITHOUT CARDIOVERSION N/A 06/15/2016   Procedure: TRANSESOPHAGEAL ECHOCARDIOGRAM (TEE);  Surgeon: Sanda Klein, MD;  Location: [facility] ENDOSCOPY;  Service: Cardiovascular;  Laterality: N/A;  . TEE WITHOUT CARDIOVERSION N/A 07/11/2016   Procedure: TRANSESOPHAGEAL ECHOCARDIOGRAM (TEE);  Surgeon: Rexene Alberts, MD;  Location: [place];  Service: Open Heart Surgery;  Laterality: N/A;  . TEE WITHOUT CARDIOVERSION N/A 07/19/2016   Procedure: TRANSESOPHAGEAL ECHOCARDIOGRAM (TEE);  Surgeon: Lelon Perla, MD;  Location: [facility] ENDOSCOPY;  Service: Cardiovascular;  Laterality: N/A;  . TRANSESOPHAGEAL ECHOCARDIOGRAM  10/19/2005   NORMAL LV; MILD TO MODERATE AMOUNT OF SOFT ATHEROMATOUS PLAQUE OF THE THORACIC AORTA; THE LEFT ATRIUM IS MILDLY DILATED;LEFT ATRIAL APPENDAGE FUNCTION IS NORMAL;NO THROMBUS IDENTIFIED. SMALL PFO WITH RIGHT TO LEFT SHUNT   Social History   Socioeconomic History  . Marital status: Married    Spouse name: Not on file  . Number of children: Not on file  . Years of education: Not on file  . Highest education level: Not on file  Occupational History  . Not on file  Social Needs  . Financial resource strain: Not on file  . Food insecurity    Worry: Not on file    Inability: Not on file  . Transportation needs    Medical: Not on file    Non-medical: Not on file  Tobacco Use  . Smoking status: Never Smoker  . Smokeless tobacco: Never Used  Substance and Sexual Activity  . Alcohol use: No  . Drug use: No  . Sexual activity: Never  Lifestyle  . Physical  activity    Days per week: Not on file    Minutes per session: Not on file  . Stress: Not on file  Relationships  . Social Herbalist on phone: Not on file    Gets together: Not on file    Attends religious service: Not on file    Active member of club or organization: Not on file    Attends meetings of clubs or organizations: Not on file    Relationship status: Not on file  Other Topics Concern  . Not on file  Social History Narrative  . Not on file   Outpatient Encounter Medications as of 05/05/2019  Medication Sig  . ACCU-CHEK SOFTCLIX LANCETS lancets Use as instructed. E11.65  . amoxicillin (AMOXIL) 500 MG tablet Take 4 tablets (2,000 mg total) by mouth as needed. Pre-dental work only.  [name] aspirin EC 81 MG tablet Take 81 mg by mouth daily.   . carvedilol (COREG) 12.5 MG  tablet TAKE 1 TABLET BY MOUTH TWICE DAILY WITH A MEAL  . eplerenone (INSPRA) 25 MG tablet Take 1 tablet (25 mg total) by mouth daily for 7 days.  [name] eplerenone (INSPRA) 25 MG tablet Take 1 tablet (25 mg total) by mouth daily.  . furosemide (LASIX) 20 MG tablet Take 1 tablet (20 mg total) by mouth as needed.  . isosorbide mononitrate (IMDUR) 30 MG 24 hr tablet Take 1 tablet (30 mg total) by mouth as needed.  [name] levothyroxine (SYNTHROID) 75 MCG tablet TAKE 1 TABLET BY MOUTH ONCE DAILY BEFORE BREAKFAST  . LOKELMA 10 g PACK packet DISSOLVE 1 PACKET IN WATER & DRINK ONCE DAILY  . neomycin-bacitracin-polymyxin (NEOSPORIN) ointment Apply 1 application topically daily as needed for wound care.  . nitroGLYCERIN (NITROSTAT) 0.4 MG SL tablet DISSOLVE ONE TABLET UNDER THE TONGUE EVERY 5 MINUTES AS NEEDED FOR CHEST PAIN.  DO NOT EXCEED A TOTAL OF 3 DOSES IN 15 MINUTES  . polyethylene glycol (MIRALAX / GLYCOLAX) packet Take 17 g by mouth daily as needed for mild constipation or moderate constipation.  . rosuvastatin (CRESTOR) 10 MG tablet TAKE 1 TABLET BY MOUTH ONCE DAILY IN THE EVENING  . sacubitril-valsartan (ENTRESTO)  49-51 MG Take 1 tablet by mouth 2 (two) times daily.   No facility-administered encounter medications on file as of 05/05/2019.    ALLERGIES: Allergies  Allergen Reactions  . Xanax [Alprazolam] Other (See Comments)    Pt feels very weak, tired and feels paralyzed     VACCINATION STATUS: Immunization History  Administered Date(s) Administered  . Influenza,inj,Quad PF,6+ Mos 08/01/2016, 07/11/2017  . Pneumococcal Polysaccharide-23 07/02/2015    Diabetes He presents for his follow-up diabetic visit. He has type 2 diabetes mellitus. Onset time: He was diagnosed at approximate age of 11 years. His disease course has been stable. There are no hypoglycemic associated symptoms. Pertinent negatives for hypoglycemia include no confusion, headaches, pallor or seizures. There are no diabetic associated symptoms. Pertinent negatives for diabetes include no chest pain, no fatigue, no polydipsia, no polyphagia, no polyuria and no weakness. There are no hypoglycemic complications. Symptoms are stable. Diabetic complications include heart disease. Risk factors for coronary artery disease include diabetes mellitus, dyslipidemia, hypertension, male sex and sedentary lifestyle. He is compliant with treatment most of the time. His weight is increasing steadily. He is following a diabetic diet. He participates in exercise intermittently. An ACE inhibitor/angiotensin II receptor blocker is being taken.  Hyperlipidemia This is a chronic problem. The current episode started more than 1 year ago. Exacerbating diseases include diabetes. Pertinent negatives include no chest pain, myalgias or shortness of breath. Current antihyperlipidemic treatment includes statins. Risk factors for coronary artery disease include diabetes mellitus, dyslipidemia, hypertension and male sex.  Hypertension This is a chronic problem. The current episode started more than 1 year ago. Pertinent negatives include no chest pain, headaches, neck  pain, palpitations or shortness of breath. Risk factors for coronary artery disease include diabetes mellitus and dyslipidemia. Hypertensive end-organ damage includes CAD/MI. Identifiable causes of hypertension include a thyroid problem.  Thyroid Problem Presents for follow-up visit. Patient reports no constipation, diarrhea, fatigue or palpitations. (Remains on levothyroxine 75 mcg p.o. nightly.  He is compliant.  He has no new complaints today.) The symptoms have been stable. His past medical history is significant for diabetes and hyperlipidemia.    Review of Systems  Constitutional: Negative for fatigue and unexpected weight change.  HENT: Negative for dental problem, mouth sores and trouble swallowing.  Eyes: Negative for visual disturbance.  Respiratory: Negative for cough, choking, chest tightness, shortness of breath and wheezing.   Cardiovascular: Negative for chest pain, palpitations and leg swelling.  Gastrointestinal: Negative for abdominal distention, abdominal pain, constipation, diarrhea, nausea and vomiting.  Endocrine: Negative for polydipsia, polyphagia and polyuria.  Genitourinary: Negative for dysuria, flank pain, hematuria and urgency.  Musculoskeletal: Negative for back pain, gait problem, myalgias and neck pain.  Skin: Negative for pallor, rash and wound.  Neurological: Negative for seizures, syncope, weakness, numbness and headaches.  Psychiatric/Behavioral: Negative for confusion and dysphoric mood.    Objective:    There were no vitals taken for this visit.  Wt Readings from Last 3 Encounters:  04/28/19 195 lb 6.4 oz (88.6 kg)  10/09/18 191 lb 12.8 oz (87 kg)  10/04/18 191 lb (86.6 kg)     Chemistry (most recent): Lab Results  Component Value Date   NA 136 04/28/2019   K 5.0 04/28/2019   CL 105 04/28/2019   CO2 22 04/28/2019   BUN 38 (H) 04/28/2019   CREATININE 1.70 (H) 04/28/2019  Results for Christian Bowman, Christian Bowman (MRN [id number]) as of 05/05/2019 15:38  Ref.  Range 04/28/2019 12:45  Glucose Latest Ref Range: 70 - 99 mg/dL 159 (H)  Hemoglobin A1C Latest Ref Range: 4.8 - 5.6 % 7.0 (H)  TSH Latest Ref Range: 0.350 - 4.500 uIU/mL 1.963  T4,Free(Direct) Latest Ref Range: 0.61 - 1.12 ng/dL 1.08     Assessment & Plan:   1. Type 2 diabetes mellitus with other circulatory complication ([place]), stage 3 CKD   His diabetes is  complicated by coronary artery disease status post coronary artery bypass graft and recent ACS and patient remains at a high risk for more acute and chronic complications of diabetes which include CAD, CVA, CKD, retinopathy, and neuropathy. These are all discussed in detail with the patient.  -4 months off of Tradjenta, his A1c is 7%.   His renal function continues to improve to  Stage 2- stage  3. - I reviewed his recent labs. - I have re-counseled the patient on diet management  by adopting a carbohydrate restricted / protein rich  Diet.  -  Suggestion is made for him to avoid simple carbohydrates  from his diet including Cakes, Sweet Desserts / Pastries, Ice Cream, Soda (diet and regular), Sweet Tea, Candies, Chips, Cookies, Store Bought Juices, Alcohol in Excess of  1-2 drinks a day, Artificial Sweeteners, and "Sugar-free" Products. This will help patient to have stable blood glucose profile and potentially avoid unintended weight gain.   - Patient is advised to stick to a routine mealtimes to eat 3 meals  a day and avoid unnecessary snacks ( to snack only to correct hypoglycemia).   - I have approached patient with the following individualized plan to manage diabetes and patient agrees.  -He is off of treatment until next visit.  If A1c continues to rise above 7%, he will be reconsidered for Tradjenta treatment.   - He will not need insulin treatment at this time.  - Patient specific target  for A1c; LDL, HDL, Triglycerides, and  Waist Circumference were discussed in detail.  2) BP/HTN: he is advised to home monitor blood  pressure and report if > 140/90 on 2 separate readings.   Reportedly he runs low blood pressure at home and would not tolerate any additional therapy. I advised him to continue his current medications including beta blocker, spironolactone and,  Lasix as needed.   3)  Lipids/HPL: His recent lipid panel showed controlled with  LDL at 63 in July 2018, he is advised to continue Crestor 10 mg p.o. nightly.  [name] 4)  Weight/Diet: CDE consult in progress, exercise, and carbohydrates information provided.  5) hypothyroidism: - This is related to his therapy with amiodarone. -His previsit thyroid function tests are consistent with appropriate replacement.   -He is advised to continue levothyroxine 75  mcg p.o. daily before breakfast  and he accepts.    - We discussed about the correct intake of his thyroid hormone, on empty stomach at fasting, with water, separated by at least 30 minutes from breakfast and other medications,  and separated by more than 4 hours from calcium, iron, multivitamins, acid reflux medications (PPIs). -Patient is made aware of the fact that thyroid hormone replacement is needed for life, dose to be adjusted by periodic monitoring of thyroid function tests.  6 ) vitamin D deficiency: He is status post therapy with  vitamin D 50,000 units weekly for 12 weeks.  His vitamin D is still subnormal at 21.  He is advised to start vitamin D3 2000 units daily for 90 days.    7) Chronic Care/Health Maintenance:  -Patient is  on ACEI/ARB and Statin medications and encouraged to continue to follow up with Ophthalmology, Podiatrist at least yearly or according to recommendations, and advised to  stay away from smoking. I have recommended yearly flu vaccine and pneumonia vaccination at least every 5 years, and  sleep for at least 7 hours a day.  I advised patient to maintain close follow up with his cardiologist and his  PCP for primary care needs.  -- Patient Care Time Today:  25 min, of which  >50% was spent in  counseling and the rest reviewing his  current and  previous labs/studies, previous treatments, his blood glucose readings, and medications' doses and developing a plan for long-term care based on the latest recommendations for standards of care.   Christian Bowman participated in the discussions, expressed understanding, and voiced agreement with the above plans.  All questions were answered to his satisfaction. he is encouraged to contact clinic should he have any questions or concerns prior to his return visit.    Follow up plan: Return in about 4 months (around 09/04/2019), or labs, for Follow up with Pre-visit Labs.  Glade Lloyd, MD Phone: [phone]  Fax: [phone]   This note was partially dictated with voice recognition software. Similar sounding words can be transcribed inadequately or may not  be corrected upon review.  05/05/2019, 3:37 PM

## 2019-05-07 ENCOUNTER — Telehealth: Payer: Self-pay

## 2019-05-07 NOTE — Telephone Encounter (Signed)
Prior Authorization for Eplerenone from [employer] has been denied.  Denied because the medication is not on formulary. Patient needs to first try Amiloride.  Routing to provider to make aware of this denial.  Reference #- [id number]  Charlann Boxer, CPhT

## 2019-05-07 NOTE — Telephone Encounter (Signed)
    COVID-19 Pre-Screening Questions:  . In the past 7 to 10 days have you had a cough,  shortness of breath, headache, congestion, fever (100 or greater) body aches, chills, sore throat, or sudden loss of taste or sense of smell? No . Have you been around anyone with known Covid 19. No . Have you been around anyone who is awaiting Covid 19 test results in the past 7 to 10 days? No . Have you been around anyone who has been exposed to Covid 19, or has mentioned symptoms of Covid 19 within the past 7 to 10 days? No  If you have any concerns/questions about symptoms patients report during screening (either on the phone or at threshold). Contact the provider seeing the patient or DOD for further guidance.  If neither are available contact a member of the leadership team.         Pt wife answered no to all covid-19 prescreening questions. I asked her to make sure he wear a mask if he has one. I let her know we are reducing the number of people coming into the office and if the pt can physically come into the appointment alone to please do so. I told her if anything changes between now and his appointment time to please call to let us know. Pt wife verbalized understanding.

## 2019-05-07 NOTE — Telephone Encounter (Signed)
Amiloride is not indicated.  He needs these medications for cardiomyopathy and amiloride is NOT a cardiomyopathy medication. Will need to talk to somewhat at his insurance to resolve this.  There is no other alternative besides eplerenone in his situation. Maybe Bonnita Nasuti could help with this?

## 2019-05-08 ENCOUNTER — Ambulatory Visit (INDEPENDENT_AMBULATORY_CARE_PROVIDER_SITE_OTHER): Payer: Medicare Other | Admitting: *Deleted

## 2019-05-08 ENCOUNTER — Other Ambulatory Visit: Payer: Self-pay

## 2019-05-08 ENCOUNTER — Telehealth (HOSPITAL_COMMUNITY): Payer: Self-pay | Admitting: Pharmacy Technician

## 2019-05-08 DIAGNOSIS — I5022 Chronic systolic (congestive) heart failure: Secondary | ICD-10-CM | POA: Diagnosis not present

## 2019-05-08 LAB — CUP PACEART INCLINIC DEVICE CHECK
Battery Remaining Longevity: 49 mo
Battery Voltage: 2.96 V
Brady Statistic AP VP Percent: 58.53 %
Brady Statistic AP VS Percent: 0.93 %
Brady Statistic AS VP Percent: 38.62 %
Brady Statistic AS VS Percent: 1.92 %
Brady Statistic RA Percent Paced: 53.11 %
Brady Statistic RV Percent Paced: 88.9 %
Date Time Interrogation Session: 20200806132954
HighPow Impedance: 62 Ohm
Implantable Lead Implant Date: 20171024
Implantable Lead Implant Date: 20171024
Implantable Lead Implant Date: 20171024
Implantable Lead Location: 753858
Implantable Lead Location: 753859
Implantable Lead Location: 753860
Implantable Lead Model: 4598
Implantable Lead Model: 5076
Implantable Pulse Generator Implant Date: 20171024
Lead Channel Impedance Value: 218.087
Lead Channel Impedance Value: 222.34 Ohm
Lead Channel Impedance Value: 222.34 Ohm
Lead Channel Impedance Value: 232.653
Lead Channel Impedance Value: 237.5 Ohm
Lead Channel Impedance Value: 342 Ohm
Lead Channel Impedance Value: 418 Ohm
Lead Channel Impedance Value: 418 Ohm
Lead Channel Impedance Value: 418 Ohm
Lead Channel Impedance Value: 456 Ohm
Lead Channel Impedance Value: 475 Ohm
Lead Channel Impedance Value: 475 Ohm
Lead Channel Impedance Value: 551 Ohm
Lead Channel Impedance Value: 760 Ohm
Lead Channel Impedance Value: 760 Ohm
Lead Channel Impedance Value: 779 Ohm
Lead Channel Impedance Value: 836 Ohm
Lead Channel Impedance Value: 836 Ohm
Lead Channel Pacing Threshold Amplitude: 0.375 V
Lead Channel Pacing Threshold Amplitude: 0.875 V
Lead Channel Pacing Threshold Amplitude: 1 V
Lead Channel Pacing Threshold Pulse Width: 0.4 ms
Lead Channel Pacing Threshold Pulse Width: 0.4 ms
Lead Channel Pacing Threshold Pulse Width: 1 ms
Lead Channel Sensing Intrinsic Amplitude: 1 mV
Lead Channel Sensing Intrinsic Amplitude: 1.75 mV
Lead Channel Sensing Intrinsic Amplitude: 13.875 mV
Lead Channel Sensing Intrinsic Amplitude: 14.125 mV
Lead Channel Setting Pacing Amplitude: 2 V
Lead Channel Setting Pacing Amplitude: 2 V
Lead Channel Setting Pacing Amplitude: 2.5 V
Lead Channel Setting Pacing Pulse Width: 0.4 ms
Lead Channel Setting Pacing Pulse Width: 1 ms
Lead Channel Setting Sensing Sensitivity: 0.3 mV

## 2019-05-08 NOTE — Progress Notes (Signed)
CRT-D device check in office. Thresholds and sensing consistent with previous device measurements. Lead impedance trends stable over time. 24 AT/AF episodes, available EGMs shows farfield A with AF seen, burden <0.1%. 2 NSVT episodes, EGMs suggest VT. Patient bi-ventricularly pacing 99.5% of the time. Device programmed with appropriate safety margins. Heart failure diagnostics reviewed and trends are stable for patient. A sensitivity decreased to 0.68mV to decrease FFRW sensing. Estimated longevity 4 years. Patient enrolled in remote follow up, ROV w/ [facility] 06/2019. Patient education completed including shock plan.

## 2019-05-08 NOTE — Telephone Encounter (Signed)
Advanced Heart Failure Patient Advocate Encounter  I have filed an appeal for the prior authorization denial of Eplerenone.   Appeal was submitted over the phone via [phone].    The insurance company states that a 7 turn around time is to be expected for redeterminations of denial.   This encounter will continue to be updated until final appeal determination.    Charlann Boxer, CPhT

## 2019-05-08 NOTE — Telephone Encounter (Signed)
Christian Bowman, any chance you could help with this? Patient has painful gynecomastia with spironolactone and systolic CHF.  Needs eplerenone but denied because they said to use amiloride, which is ridiculous and hopefully they know it.  Now apparently takes a long time to get appealed.  Do you know any tricks to expedite? Thanks.

## 2019-05-09 ENCOUNTER — Telehealth (HOSPITAL_COMMUNITY): Payer: Self-pay | Admitting: Pharmacist

## 2019-05-09 NOTE — Telephone Encounter (Signed)
Tried to contact patient regarding epleranone Will retry on Monday  [place].D. CPP, BCPS Clinical Pharmacist [phone] 05/09/2019 4:47 PM

## 2019-05-12 ENCOUNTER — Encounter: Payer: Self-pay | Admitting: Cardiology

## 2019-05-12 NOTE — Telephone Encounter (Signed)
Received fax from [place] with f/u questions.  Questions answered, form signed by Dr Aundra Dubin and faxed back to them at [phone].

## 2019-05-12 NOTE — Progress Notes (Signed)
Remote ICD transmission.   

## 2019-05-13 NOTE — Telephone Encounter (Signed)
Spoke to wife - she filled eplerenone for$100 earlier this month - If insurance does not approve she is willing /  Able to pay$25 with [employer] card  I scheduled him for labs next week hopefully we will know coverage info by then  Also told her Nira Conn responded with an appeal Bonnita Nasuti Pharm.D. CPP, BCPS Clinical Pharmacist [phone] 05/13/2019 3:48 PM

## 2019-05-13 NOTE — Telephone Encounter (Signed)
Called appeals department to check the status of Eplerenone appeal. It was approved as of today through 10/02/2019 under the patients Part D insurance.   [place] to confirm that medication did successfully go through.  30 day supply has a co-pay of $15.92. Called and spoke with patients wife who seemed pleased.  Charlann Boxer, CPhT

## 2019-05-15 ENCOUNTER — Encounter (HOSPITAL_COMMUNITY): Payer: Self-pay

## 2019-05-17 ENCOUNTER — Other Ambulatory Visit: Payer: Self-pay | Admitting: Cardiovascular Disease

## 2019-05-20 ENCOUNTER — Ambulatory Visit (HOSPITAL_COMMUNITY)
Admission: RE | Admit: 2019-05-20 | Discharge: 2019-05-20 | Disposition: A | Discharge status: Home / Self Care | Payer: Medicare Other | Source: Ambulatory Visit | Attending: Internal Medicine | Admitting: Internal Medicine

## 2019-05-20 ENCOUNTER — Other Ambulatory Visit: Payer: Self-pay

## 2019-05-20 DIAGNOSIS — I5022 Chronic systolic (congestive) heart failure: Secondary | ICD-10-CM | POA: Insufficient documentation

## 2019-05-20 LAB — BASIC METABOLIC PANEL
Anion gap: 8 (ref 5–15)
BUN: 26 mg/dL — ABNORMAL HIGH (ref 8–23)
CO2: 23 mmol/L (ref 22–32)
Calcium: 9 mg/dL (ref 8.9–10.3)
Chloride: 105 mmol/L (ref 98–111)
Creatinine, Ser: 1.54 mg/dL — ABNORMAL HIGH (ref 0.61–1.24)
GFR calc Af Amer: 52 mL/min — ABNORMAL LOW (ref >60)
GFR calc non Af Amer: 45 mL/min — ABNORMAL LOW (ref >60)
Glucose, Bld: 141 mg/dL — ABNORMAL HIGH (ref 70–99)
Potassium: 4.6 mmol/L (ref 3.5–5.1)
Sodium: 136 mmol/L (ref 135–145)

## 2019-05-22 ENCOUNTER — Other Ambulatory Visit (HOSPITAL_COMMUNITY): Payer: Self-pay | Admitting: Cardiology

## 2019-06-16 ENCOUNTER — Other Ambulatory Visit: Payer: Self-pay | Admitting: "Endocrinology

## 2019-06-28 ENCOUNTER — Other Ambulatory Visit: Payer: Self-pay | Admitting: Cardiovascular Disease

## 2019-06-30 ENCOUNTER — Telehealth: Payer: Self-pay | Admitting: Cardiology

## 2019-06-30 NOTE — Telephone Encounter (Signed)
Left message informing that wife can accompany pt tomorrow d/t memory issues.

## 2019-06-30 NOTE — Telephone Encounter (Signed)
New Message:   Wife called and said she will need to come in with pt tomorrow for his appointment with Dr Curt Bears. She says pt have a problem with remembering.

## 2019-07-01 ENCOUNTER — Ambulatory Visit (INDEPENDENT_AMBULATORY_CARE_PROVIDER_SITE_OTHER): Payer: Medicare Other | Admitting: Cardiology

## 2019-07-01 ENCOUNTER — Other Ambulatory Visit: Payer: Self-pay

## 2019-07-01 ENCOUNTER — Encounter: Payer: Self-pay | Admitting: Cardiology

## 2019-07-01 VITALS — BP 132/78 | HR 75 | Ht 72.0 in | Wt 198.0 lb

## 2019-07-01 DIAGNOSIS — I255 Ischemic cardiomyopathy: Secondary | ICD-10-CM | POA: Diagnosis not present

## 2019-07-01 MED ORDER — CARVEDILOL 12.5 MG PO TABS: 18.7500 mg | ORAL_TABLET | Freq: Two times a day (BID) | ORAL | 3 refills | Status: DC

## 2019-07-01 NOTE — Progress Notes (Signed)
Electrophysiology Office Note   Date:  07/01/2019   ID:  DEVARIS GILLOTT, DOB 01-Apr-1946, MRN [id number]  PCP:  Sharilyn Sites, MD  Cardiologist:  Aundra Dubin Primary Electrophysiologist:  Aleisha Paone Meredith Leeds, MD    No chief complaint on file.    History of Present Illness: Christian Bowman is a 73 y.o. male who presents today for electrophysiology evaluation.   History of CAD s/p CABG in 2007 then redo CABG in 10/17 with mitral valve replacement. He was admitted in 10/17 for redo CABG with SVG-PDA and SVG-ramus.  He also had mitral valve replacement with a bioprosthetic valve because of infarct-related mitral regurgitation.  His EF on last study (10/17 TEE) was 20-25%.   Post-operative course was complicated by CHF requiring diuresis.  He also had atrial flutter and required DCCV.  Due to complete heart block, he got a CRT-D system.    Had atrial flutter ablation 05/11/17.  Today, denies symptoms of palpitations, chest pain, shortness of breath, orthopnea, PND, lower extremity edema, claudication, dizziness, presyncope, syncope, bleeding, or neurologic sequela. The patient is tolerating medications without difficulties.  He has been getting depressed since the coronavirus pandemic.  He has no chest pain or shortness of breath.  He is able to do most of his daily activities other than limitations due to fatigue.  He did have 3 seconds of nonsustained ventricular tachycardia noted on device interrogation.   Past Medical History:  Diagnosis Date  . AICD (automatic cardioverter/defibrillator) present    medtronic-   DR. CROITORU , DR. Aundra Dubin   . Anginal pain ([place])    cp sat 08/11/17  . Anxiety   . CAD (coronary artery disease)   . CHF (congestive heart failure) ([place])   . Complication of anesthesia    took awhile to wake up   . Coronary artery disease involving coronary bypass graft   . Cyst of neck    right side  . DM2 (diabetes mellitus, type 2) ([place]) 08/26/2013  . Dyspnea   . Heart attack  ([place])    "not sure when" (08/20/2017)  . HTN (hypertension) 08/26/2013  . Hyperlipidemia 08/26/2013  . Hypothyroidism   . Left main coronary artery disease   . Left renal artery stenosis ([place])    Genesis 6x12 stent 2007  . Obesity (BMI 30.0-34.9) 08/26/2013  . Postoperative atrial fibrillation ([place]) 10/15/2005  . Presence of permanent cardiac pacemaker   . S/P CABG x 4 10/13/2005   LIMA to LAD, SVG to intermediate branch, sequential SVG to PDA and RPL branch, EVH via right thigh  . S/P mitral valve replacement with bioprosthetic valve 07/11/2016   31 mm [facility] Mitral bovine bioprosthetic tissue valve  . S/P redo CABG x 2 07/11/2016   SVG to PDA and SVG to Intermediate Branch, EVH via left thigh   Past Surgical History:  Procedure Laterality Date  . A-FLUTTER ABLATION N/A 05/11/2017   Procedure: A-Flutter Ablation;  Surgeon: Constance Haw, MD;  Location: [place] [facility];  Service: Cardiovascular;  Laterality: N/A;  . CARDIAC CATHETERIZATION N/A 06/21/2016   Procedure: Right/Left Heart Cath and Coronary/Graft Angiography;  Surgeon: Sherren Mocha, MD;  Location: [place] [facility];  Service: Cardiovascular;  Laterality: N/A;  . CARDIAC VALVE REPLACEMENT    . CARDIOVERSION N/A 07/19/2016   Procedure: CARDIOVERSION;  Surgeon: Lelon Perla, MD;  Location[facility] ENDOSCOPY;  Service: Cardiovascular;  Laterality: N/A;  . CARDIOVERSION N/A 09/08/2016   Procedure: CARDIOVERSION;  Surgeon: Fay Records,  MD;  Location: [place];  Service: Cardiovascular;  Laterality: N/A;  . CORONARY ANGIOPLASTY     STENT 2016  [place]     DES in SVG to right coronary artery system  . CORONARY ARTERY BYPASS GRAFT  10/13/2005   LIMA to LAD, SVG to intermediate branch, sequential SVG to PDA and RPL  . CORONARY ARTERY BYPASS GRAFT N/A 07/11/2016   Procedure: REDO CORONARY ARTERY BYPASS GRAFTING (CABG) x two using left leg greater saphenous  vein harvested endoscopically-SVG to PDA -SVG to RAMUS INTERMEDIATE;  Surgeon: Rexene Alberts, MD;  Location: [place];  Service: Open Heart Surgery;  Laterality: N/A;  . CORONARY ARTERY BYPASS GRAFT N/A 07/11/2016   Procedure: Re-exploration (CABG) for post op bleeding,;  Surgeon: Rexene Alberts, MD;  Location: [place];  Service: Open Heart Surgery;  Laterality: N/A;  . EP IMPLANTABLE DEVICE N/A 07/25/2016   Procedure: BiV ICD Insertion CRT-D;  Surgeon: Zadia Uhde Meredith Leeds, MD;  Location: [place] [facility];  Service: Cardiovascular;  Laterality: N/A;  . INGUINAL HERNIA REPAIR Left 08/20/2017  . INGUINAL HERNIA REPAIR Left 08/20/2017   Procedure: OPEN LEFT INGUINAL HERNIA REPAIR;  Surgeon: Alphonsa Overall, MD;  Location: [place];  Service: General;  Laterality: Left;  . LEFT HEART CATH AND CORS/GRAFTS ANGIOGRAPHY N/A 12/18/2016   Procedure: Left Heart Cath and Cors/Grafts Angiography;  Surgeon: Sherren Mocha, MD;  Location: [place] [facility];  Service: Cardiovascular;  Laterality: N/A;  . LEFT HEART CATH AND CORS/GRAFTS ANGIOGRAPHY N/A 08/22/2017   Procedure: LEFT HEART CATH AND CORS/GRAFTS ANGIOGRAPHY;  Surgeon: Larey Dresser, MD;  Location: [place] [facility];  Service: Cardiovascular;  Laterality: N/A;  . MITRAL VALVE REPLACEMENT N/A 07/11/2016   Procedure: MITRAL VALVE (MV) REPLACEMENT;  Surgeon: Rexene Alberts, MD;  Location: [place];  Service: Open Heart Surgery;  Laterality: N/A;  . MYOCARDICAL PERFUSION  10/09/2007   NORMAL PERFUSION IN ALL REGIONS;NO EVIDENCE OF INDUCIBLE ISCHEMIA;POST STRESS EF% 66  . RENAL ARTERY STENT Right 2007  . RENAL DOPPLER  03/28/2010   RIGHT RA-NORMAL;LEFT PROXIMAL RA AT STENT-PATENT WITH NO EVIDENCE OF SIGN DIAMETER REDUCTION. R & L KIDNEYS: EQUAL IN SIZE,SYMMETRICAL IN SHAPE.  . TEE WITHOUT CARDIOVERSION N/A 06/15/2016   Procedure: TRANSESOPHAGEAL ECHOCARDIOGRAM (TEE);  Surgeon: Sanda Klein, MD;  Location: [facility] ENDOSCOPY;  Service: Cardiovascular;  Laterality: N/A;   . TEE WITHOUT CARDIOVERSION N/A 07/11/2016   Procedure: TRANSESOPHAGEAL ECHOCARDIOGRAM (TEE);  Surgeon: Rexene Alberts, MD;  Location: [place];  Service: Open Heart Surgery;  Laterality: N/A;  . TEE WITHOUT CARDIOVERSION N/A 07/19/2016   Procedure: TRANSESOPHAGEAL ECHOCARDIOGRAM (TEE);  Surgeon: Lelon Perla, MD;  Location: [facility] ENDOSCOPY;  Service: Cardiovascular;  Laterality: N/A;  . TRANSESOPHAGEAL ECHOCARDIOGRAM  10/19/2005   NORMAL LV; MILD TO MODERATE AMOUNT OF SOFT ATHEROMATOUS PLAQUE OF THE THORACIC AORTA; THE LEFT ATRIUM IS MILDLY DILATED;LEFT ATRIAL APPENDAGE FUNCTION IS NORMAL;NO THROMBUS IDENTIFIED. SMALL PFO WITH RIGHT TO LEFT SHUNT     Current Outpatient Medications  Medication Sig Dispense Refill  . ACCU-CHEK SOFTCLIX LANCETS lancets Use as instructed. E11.65 100 each 2  . amoxicillin (AMOXIL) 500 MG tablet Take 4 tablets (2,000 mg total) by mouth as needed. Pre-dental work only. 4 tablet 3  . aspirin EC 81 MG tablet Take 81 mg by mouth daily.     . carvedilol (COREG) 12.5 MG tablet TAKE 1 TABLET BY MOUTH TWICE DAILY WITH A MEAL 60 tablet 5  .  eplerenone (INSPRA) 25 MG tablet Take 1 tablet (25 mg total) by mouth daily. 30 tablet 6  . furosemide (LASIX) 20 MG tablet Take 1 tablet (20 mg total) by mouth as needed. 15 tablet 6  . isosorbide mononitrate (IMDUR) 30 MG 24 hr tablet Take 1 tablet (30 mg total) by mouth as needed. 45 tablet 2  . levothyroxine (SYNTHROID) 75 MCG tablet TAKE 1 TABLET BY MOUTH ONCE DAILY BEFORE BREAKFAST 90 tablet 0  . LOKELMA 10 g PACK packet DISSOLVE 1 PACKET IN WATER & DRINK ONCE DAILY 90 each 0  . neomycin-bacitracin-polymyxin (NEOSPORIN) ointment Apply 1 application topically daily as needed for wound care.    . nitroGLYCERIN (NITROSTAT) 0.4 MG SL tablet DISSOLVE ONE TABLET UNDER THE TONGUE EVERY 5 MINUTES AS NEEDED FOR CHEST PAIN.  DO NOT EXCEED A TOTAL OF 3 DOSES IN 15 MINUTES 25 tablet 0  . polyethylene glycol (MIRALAX / GLYCOLAX) packet Take 17 g  by mouth daily as needed for mild constipation or moderate constipation.    . rosuvastatin (CRESTOR) 10 MG tablet Take 1 tablet (10 mg total) by mouth daily. TAKE 1 TABLET BY MOUTH ONCE DAILY IN THE EVENING 90 tablet 1  . sacubitril-valsartan (ENTRESTO) 49-51 MG Take 1 tablet by mouth 2 (two) times daily. 180 tablet 3   No current facility-administered medications for this visit.     Allergies:   Xanax [alprazolam]   Social History:  The patient  reports that he has never smoked. He has never used smokeless tobacco. He reports that he does not drink alcohol or use drugs.   Family History:  The patient's family history includes Heart attack in his father and mother; Heart disease in his father, maternal grandmother, and mother; Hypertension in his father and sister.    ROS:  Please see the history of present illness.   Otherwise, review of systems is positive for none.   All other systems are reviewed and negative.   PHYSICAL EXAM: VS:  BP 132/78   Pulse 75   Ht 6' (1.829 m)   Wt 198 lb (89.8 kg)   SpO2 97%   BMI 26.85 kg/m  , BMI Body mass index is 26.85 kg/m. GEN: Well nourished, well developed, in no acute distress  HEENT: normal  Neck: no JVD, carotid bruits, or masses Cardiac: RRR; no murmurs, rubs, or gallops,no edema  Respiratory:  clear to auscultation bilaterally, normal work of breathing GI: soft, nontender, nondistended, + BS MS: no deformity or atrophy  Skin: warm and dry, device site well healed Neuro:  Strength and sensation are intact Psych: euthymic mood, full affect  EKG:  EKG is ordered today. Personal review of the ekg ordered shows AV paced  Personal review of the device interrogation today. Results in [place]: 04/28/2019: ALT 22; TSH 1.963 05/20/2019: BUN 26; Creatinine, Ser 1.54; Potassium 4.6; Sodium 136    Lipid Panel     Component Value Date/Time   CHOL 120 10/01/2018 0925   TRIG 144 10/01/2018 0925   HDL 31 (L) 10/01/2018 0925    CHOLHDL 3.9 10/01/2018 0925   CHOLHDL 4.1 02/19/2018 1018   VLDL 32 02/19/2018 1018   LDLCALC 60 10/01/2018 0925     Wt Readings from Last 3 Encounters:  07/01/19 198 lb (89.8 kg)  04/28/19 195 lb 6.4 oz (88.6 kg)  10/09/18 191 lb 12.8 oz (87 kg)      Other studies Reviewed: Additional studies/ records that were reviewed today  include: TEE 07/19/16  Review of the above records today demonstrates:  - Left ventricle: The cavity size was severely dilated. Systolic   function was severely reduced. The estimated ejection fraction   was in the range of 20% to 25%. Diffuse hypokinesis. - Aortic valve: No evidence of vegetation. - Mitral valve: A bioprosthesis was present. - Left atrium: The atrium was moderately dilated. No evidence of   thrombus in the atrial cavity or appendage. - Right ventricle: Systolic function was moderately reduced. - Right atrium: No evidence of thrombus in the atrial cavity or   appendage. - Atrial septum: No defect or patent foramen ovale was identified. - Tricuspid valve: No evidence of vegetation. There was   mild-moderate regurgitation. - Pulmonic valve: No evidence of vegetation.  Cath 06/20/17 1. Severe native three-vessel coronary artery disease with total occlusion of the LAD, total occlusion of the ramus intermedius, and total occlusion of the RCA 2. Continued patency of the LIMA to LAD with total occlusion of the apical LAD appearance unchanged from previous cath study 3. Patency of the most recent saphenous vein graft to PDA without significant stenosis present 4. Interval occlusion of the old saphenous vein graft PDA 5. Nonvisualization of the saphenous vein graft to diagonal, suspect total occlusion from nonselective imaging 6. Low LVEDP  ASSESSMENT AND PLAN:  1.  Atrial flutter: Status post ablation 18.  No obvious recurrence.  This patients CHA2DS2-VASc Score and unadjusted Ischemic Stroke Rate (% per year) is equal to 4.8 % stroke  rate/year from a score of 4  Above score calculated as 1 point each if present [CHF, HTN, DM, Vascular=MI/PAD/Aortic Plaque, Age if 65-74, or Male] Above score calculated as 2 points each if present [Age > 75, or Stroke/TIA/TE]   2. Complete AV block: Status post Medtronic CRT-D.  Vice functioning appropriately.  He has by V pacing around 90% of the time due to PACs and PVCs.  Nakira Litzau increase carvedilol.  3. Chronic systolic heart failure: On optimal medical therapy.  No signs of volume overload.  4. Mitral regurgitation: Status post redo AVR.  Plan per primary cardiology.  5. CAD: Status post redo CABG.  Has stable angina.  No changes.  6.  Nonsustained VT: Found on device interrogation.  Planning to increase carvedilol.  Marena Witts try to avoid antiarrhythmics at this time.     Current medicines are reviewed at length with the patient today.   The patient does not have concerns regarding his medicines.  The following changes were made today: None  Labs/ tests ordered today include:  Orders Placed This Encounter  Procedures  . EKG 12-Lead     Disposition:   FU with Luetta Piazza 12 months  Signed, Marvel Mcphillips Meredith Leeds, MD  07/01/2019 3:05 PM     [facility] [address] [place] [address] [phone] (office) [phone] (fax)

## 2019-07-01 NOTE — Patient Instructions (Addendum)
Medication Instructions:  Your physician has recommended you make the following change in your medication:  1. INCREASE Carvedilol to 18.75 mg twice daily  *If you need a refill on your cardiac medications before your next appointment, please call your pharmacy*  Labwork: None ordered  Testing/Procedures: None ordered  Follow-Up: Remote monitoring is used to monitor your Pacemaker or ICD from home. This monitoring reduces the number of office visits required to check your device to one time per year. It allows [place] to keep an eye on the functioning of your device to ensure it is working properly. You are scheduled for a device check from home on 07/29/19. You may send your transmission at any time that day. If you have a wireless device, the transmission will be sent automatically. After your physician reviews your transmission, you will receive a postcard with your next transmission date.  Your physician wants you to follow-up in: 1 year with Dr. Curt Bears.  You will receive a reminder letter in the mail two months in advance. If you don't receive a letter, please call our office to schedule the follow-up appointment.  Thank you for choosing [facility]!!   Trinidad Curet, RN [phone]  Any Other Special Instructions Will Be Listed Below (If Applicable).

## 2019-07-21 ENCOUNTER — Encounter: Payer: Medicare Other | Admitting: Cardiovascular Disease

## 2019-07-21 ENCOUNTER — Other Ambulatory Visit (HOSPITAL_COMMUNITY): Payer: Self-pay | Admitting: Cardiology

## 2019-07-28 ENCOUNTER — Encounter: Payer: Self-pay | Admitting: Cardiovascular Disease

## 2019-07-28 ENCOUNTER — Ambulatory Visit (INDEPENDENT_AMBULATORY_CARE_PROVIDER_SITE_OTHER): Payer: Medicare Other | Admitting: Cardiovascular Disease

## 2019-07-28 ENCOUNTER — Other Ambulatory Visit: Payer: Self-pay

## 2019-07-28 VITALS — BP 128/83 | HR 77 | Ht 72.0 in | Wt 199.0 lb

## 2019-07-28 DIAGNOSIS — I5042 Chronic combined systolic (congestive) and diastolic (congestive) heart failure: Secondary | ICD-10-CM | POA: Diagnosis not present

## 2019-07-28 DIAGNOSIS — I483 Typical atrial flutter: Secondary | ICD-10-CM

## 2019-07-28 DIAGNOSIS — I10 Essential (primary) hypertension: Secondary | ICD-10-CM

## 2019-07-28 DIAGNOSIS — E7800 Pure hypercholesterolemia, unspecified: Secondary | ICD-10-CM

## 2019-07-28 DIAGNOSIS — Z95810 Presence of automatic (implantable) cardiac defibrillator: Secondary | ICD-10-CM | POA: Diagnosis not present

## 2019-07-28 DIAGNOSIS — Z953 Presence of xenogenic heart valve: Secondary | ICD-10-CM

## 2019-07-28 DIAGNOSIS — R220 Localized swelling, mass and lump, head: Secondary | ICD-10-CM | POA: Diagnosis not present

## 2019-07-28 DIAGNOSIS — E1121 Type 2 diabetes mellitus with diabetic nephropathy: Secondary | ICD-10-CM | POA: Diagnosis not present

## 2019-07-28 DIAGNOSIS — E1122 Type 2 diabetes mellitus with diabetic chronic kidney disease: Secondary | ICD-10-CM

## 2019-07-28 DIAGNOSIS — I25708 Atherosclerosis of coronary artery bypass graft(s), unspecified, with other forms of angina pectoris: Secondary | ICD-10-CM | POA: Diagnosis not present

## 2019-07-28 DIAGNOSIS — N1832 Chronic kidney disease, stage 3b: Secondary | ICD-10-CM | POA: Diagnosis not present

## 2019-07-28 DIAGNOSIS — I255 Ischemic cardiomyopathy: Secondary | ICD-10-CM

## 2019-07-28 DIAGNOSIS — Z8679 Personal history of other diseases of the circulatory system: Secondary | ICD-10-CM | POA: Diagnosis not present

## 2019-07-28 DIAGNOSIS — Z23 Encounter for immunization: Secondary | ICD-10-CM

## 2019-07-28 DIAGNOSIS — R221 Localized swelling, mass and lump, neck: Secondary | ICD-10-CM

## 2019-07-28 NOTE — Progress Notes (Addendum)
Cardiology Office Note    Date:  07/31/2019   ID:  Christian Bowman, DOB January 19, 1946, MRN [id number]  PCP:  Sharilyn Sites, MD  Cardiologist:  Loralie Champagne, MD; Allegra Lai, MD; Sanda Klein, MD   Chief Complaint  Patient presents with  . Congestive Heart Failure  . Cardiac Valve Problem    History of Present Illness:  Christian Bowman is a 73 y.o. male with extensive and severe cardiac problems.  He is doing quite well right now.  He has not required loop diuretic dose adjustment to hospitalization for heart failure in about a year now.  He wonders whether he still needs to go to the heart failure clinic.  EF remains severely depressed, 25-30% by his most recent echo in July.  He saw Dr. Curt Bears and his dose of carvedilol was increased on September 29, to improve the prevalence of biventricular pacing.  Device interrogation today shows biventricular pacing efficiency has increased from 87% to 93.5%.  He also has 72% atrial pacing.  His current generator implanted in 2017 has about 3.8 years of remaining longevity.  He has not had any episodes of atrial fibrillation or ventricular tachycardia since last device interrogation.  He has NYHA functional class II exertional dyspnea, but otherwise no cardiovascular complaints.The patient specifically denies any chest pain at rest or exertion, dyspnea at rest, orthopnea, paroxysmal nocturnal dyspnea, syncope, palpitations, focal neurological deficits, intermittent claudication, lower extremity edema, unexplained weight gain, cough, hemoptysis or wheezing.  His submandibular mass (previously drained since it was felt to be a cyst) it is enlarging.  It is causing some problems with hoarseness and dysphagia.  He is thinking of having it surgically extirpated and would like referral to ENT.  He wonders about the surgical risk from a cardiovascular standpoint.  Diabetes control has improved and he has stopped taking Tradjenta.  Last hemoglobin A1c was 6.9%.   Interrogation of his Medtronic Claria CRT-D device shows normal device function.   He underwent redo coronary artery bypass 2 (SVG to PDA, SVG to ramus), as well as mitral valve replacement (bioprosthetic Edwards magna 31 mm) on [id number], complicated by AV block and atrial flutter requiring cardioversion. Estimated EF 20-25 % on his 07/18/16 TEE. A biventricular defibrillator (Medtronic Claria MRI CRT-D) was implanted on July 25, 2016. On December 18, 2016 he was hospitalized for small non-STEMI and was found to have interval occlusion of the old SVG to PDA, while the more recently placed SVG was still patent. LVEDP was quite low at only 3 mmHg during that catheterization. He had atrial flutter ablation in August 2018 and is off anticoagulation. He had a NSTEMI in November 2018 after inguinal hernia repair surgery.  He understands the need for endocarditis prophylaxis with dental procedures or other medical procedures that might cause bacteremia and the indication for anticoagulation chronically.  He is still following up with Dr. Aundra Dubin and Dr. Curt Bears .  Past Medical History:  Diagnosis Date  . AICD (automatic cardioverter/defibrillator) present    medtronic-   DR. Jakaila Norment , DR. Aundra Dubin   . Anginal pain ([place])    cp sat 08/11/17  . Anxiety   . CAD (coronary artery disease)   . CHF (congestive heart failure) ([place])   . Complication of anesthesia    took awhile to wake up   . Coronary artery disease involving coronary bypass graft   . Cyst of neck    right side  . DM2 (diabetes mellitus, type 2) ([place]) 08/26/2013  .  Dyspnea   . Heart attack ([place])    "not sure when" (08/20/2017)  . HTN (hypertension) 08/26/2013  . Hyperlipidemia 08/26/2013  . Hypothyroidism   . Left main coronary artery disease   . Left renal artery stenosis ([place])    Genesis 6x12 stent 2007  . Obesity (BMI 30.0-34.9) 08/26/2013  . Postoperative atrial fibrillation ([place]) 10/15/2005  . Presence of permanent cardiac  pacemaker   . S/P CABG x 4 10/13/2005   LIMA to LAD, SVG to intermediate branch, sequential SVG to PDA and RPL branch, EVH via right thigh  . S/P mitral valve replacement with bioprosthetic valve 07/11/2016   31 mm [facility] Mitral bovine bioprosthetic tissue valve  . S/P redo CABG x 2 07/11/2016   SVG to PDA and SVG to Intermediate Branch, EVH via left thigh    Past Surgical History:  Procedure Laterality Date  . A-FLUTTER ABLATION N/A 05/11/2017   Procedure: A-Flutter Ablation;  Surgeon: Constance Haw, MD;  Location: [place] [facility];  Service: Cardiovascular;  Laterality: N/A;  . CARDIAC CATHETERIZATION N/A 06/21/2016   Procedure: Right/Left Heart Cath and Coronary/Graft Angiography;  Surgeon: Sherren Mocha, MD;  Location: [place] CV LAB;  Service: Cardiovascular;  Laterality: N/A;  . CARDIAC VALVE REPLACEMENT    . CARDIOVERSION N/A 07/19/2016   Procedure: CARDIOVERSION;  Surgeon: Lelon Perla, MD;  Location: [facility] ENDOSCOPY;  Service: Cardiovascular;  Laterality: N/A;  . CARDIOVERSION N/A 09/08/2016   Procedure: CARDIOVERSION;  Surgeon: Fay Records, MD;  Location: [place];  Service: Cardiovascular;  Laterality: N/A;  . CORONARY ANGIOPLASTY     STENT 2016  [place]     DES in SVG to right coronary artery system  . CORONARY ARTERY BYPASS GRAFT  10/13/2005   LIMA to LAD, SVG to intermediate branch, sequential SVG to PDA and RPL  . CORONARY ARTERY BYPASS GRAFT N/A 07/11/2016   Procedure: REDO CORONARY ARTERY BYPASS GRAFTING (CABG) x two using left leg greater saphenous vein harvested endoscopically-SVG to PDA -SVG to RAMUS INTERMEDIATE;  Surgeon: Rexene Alberts, MD;  Location: [place];  Service: Open Heart Surgery;  Laterality: N/A;  . CORONARY ARTERY BYPASS GRAFT N/A 07/11/2016   Procedure: Re-exploration (CABG) for post op bleeding,;  Surgeon: Rexene Alberts, MD;  Location: [place];  Service: Open Heart Surgery;   Laterality: N/A;  . EP IMPLANTABLE DEVICE N/A 07/25/2016   Procedure: BiV ICD Insertion CRT-D;  Surgeon: Will Meredith Leeds, MD;  Location: [place] CV LAB;  Service: Cardiovascular;  Laterality: N/A;  . INGUINAL HERNIA REPAIR Left 08/20/2017  . INGUINAL HERNIA REPAIR Left 08/20/2017   Procedure: OPEN LEFT INGUINAL HERNIA REPAIR;  Surgeon: Alphonsa Overall, MD;  Location: [place];  Service: General;  Laterality: Left;  . LEFT HEART CATH AND CORS/GRAFTS ANGIOGRAPHY N/A 12/18/2016   Procedure: Left Heart Cath and Cors/Grafts Angiography;  Surgeon: Sherren Mocha, MD;  Location: [place] CV LAB;  Service: Cardiovascular;  Laterality: N/A;  . LEFT HEART CATH AND CORS/GRAFTS ANGIOGRAPHY N/A 08/22/2017   Procedure: LEFT HEART CATH AND CORS/GRAFTS ANGIOGRAPHY;  Surgeon: Larey Dresser, MD;  Location: [place] CV LAB;  Service: Cardiovascular;  Laterality: N/A;  . MITRAL VALVE REPLACEMENT N/A 07/11/2016   Procedure: MITRAL VALVE (MV) REPLACEMENT;  Surgeon: Rexene Alberts, MD;  Location: [place];  Service: Open Heart Surgery;  Laterality: N/A;  . MYOCARDICAL PERFUSION  10/09/2007   NORMAL PERFUSION IN ALL REGIONS;NO EVIDENCE OF  INDUCIBLE ISCHEMIA;POST STRESS EF% 66  . RENAL ARTERY STENT Right 2007  . RENAL DOPPLER  03/28/2010   RIGHT RA-NORMAL;LEFT PROXIMAL RA AT STENT-PATENT WITH NO EVIDENCE OF SIGN DIAMETER REDUCTION. R & L KIDNEYS: EQUAL IN SIZE,SYMMETRICAL IN SHAPE.  . TEE WITHOUT CARDIOVERSION N/A 06/15/2016   Procedure: TRANSESOPHAGEAL ECHOCARDIOGRAM (TEE);  Surgeon: Sanda Klein, MD;  Location: [facility] ENDOSCOPY;  Service: Cardiovascular;  Laterality: N/A;  . TEE WITHOUT CARDIOVERSION N/A 07/11/2016   Procedure: TRANSESOPHAGEAL ECHOCARDIOGRAM (TEE);  Surgeon: Rexene Alberts, MD;  Location: [place];  Service: Open Heart Surgery;  Laterality: N/A;  . TEE WITHOUT CARDIOVERSION N/A 07/19/2016   Procedure: TRANSESOPHAGEAL ECHOCARDIOGRAM (TEE);  Surgeon: Lelon Perla, MD;  Location: [facility] ENDOSCOPY;   Service: Cardiovascular;  Laterality: N/A;  . TRANSESOPHAGEAL ECHOCARDIOGRAM  10/19/2005   NORMAL LV; MILD TO MODERATE AMOUNT OF SOFT ATHEROMATOUS PLAQUE OF THE THORACIC AORTA; THE LEFT ATRIUM IS MILDLY DILATED;LEFT ATRIAL APPENDAGE FUNCTION IS NORMAL;NO THROMBUS IDENTIFIED. SMALL PFO WITH RIGHT TO LEFT SHUNT    Current Medications: Outpatient Medications Prior to Visit  Medication Sig Dispense Refill  . amoxicillin (AMOXIL) 500 MG tablet Take 4 tablets (2,000 mg total) by mouth as needed. Pre-dental work only. 4 tablet 3  . aspirin EC 81 MG tablet Take 81 mg by mouth daily.     . carvedilol (COREG) 12.5 MG tablet Take 1.5 tablets (18.75 mg total) by mouth 2 (two) times daily. 180 tablet 3  . eplerenone (INSPRA) 25 MG tablet Take 1 tablet (25 mg total) by mouth daily. 30 tablet 6  . furosemide (LASIX) 20 MG tablet Take 1 tablet (20 mg total) by mouth as needed. 15 tablet 6  . isosorbide mononitrate (IMDUR) 30 MG 24 hr tablet Take 1 tablet (30 mg total) by mouth as needed. 45 tablet 2  . levothyroxine (SYNTHROID) 75 MCG tablet TAKE 1 TABLET BY MOUTH ONCE DAILY BEFORE BREAKFAST 90 tablet 0  . LOKELMA 10 g PACK packet DISSOLVE 1 POWDER PACKET IN WATER & DRINK ONCE DAILY 90 each 0  . neomycin-bacitracin-polymyxin (NEOSPORIN) ointment Apply 1 application topically daily as needed for wound care.    . nitroGLYCERIN (NITROSTAT) 0.4 MG SL tablet DISSOLVE ONE TABLET UNDER THE TONGUE EVERY 5 MINUTES AS NEEDED FOR CHEST PAIN.  DO NOT EXCEED A TOTAL OF 3 DOSES IN 15 MINUTES 25 tablet 0  . polyethylene glycol (MIRALAX / GLYCOLAX) packet Take 17 g by mouth daily as needed for mild constipation or moderate constipation.    . rosuvastatin (CRESTOR) 10 MG tablet Take 1 tablet (10 mg total) by mouth daily. TAKE 1 TABLET BY MOUTH ONCE DAILY IN THE EVENING 90 tablet 1  . sacubitril-valsartan (ENTRESTO) 49-51 MG Take 1 tablet by mouth 2 (two) times daily. 180 tablet 3  . ACCU-CHEK SOFTCLIX LANCETS lancets Use as  instructed. E11.65 100 each 2   No facility-administered medications prior to visit.      Allergies:   Xanax [alprazolam]   Social History   Socioeconomic History  . Marital status: Married    Spouse name: Not on file  . Number of children: Not on file  . Years of education: Not on file  . Highest education level: Not on file  Occupational History  . Not on file  Social Needs  . Financial resource strain: Not on file  . Food insecurity    Worry: Not on file    Inability: Not on file  . Transportation needs    Medical: Not on file  Non-medical: Not on file  Tobacco Use  . Smoking status: Never Smoker  . Smokeless tobacco: Never Used  Substance and Sexual Activity  . Alcohol use: No  . Drug use: No  . Sexual activity: Never  Lifestyle  . Physical activity    Days per week: Not on file    Minutes per session: Not on file  . Stress: Not on file  Relationships  . Social Herbalist on phone: Not on file    Gets together: Not on file    Attends religious service: Not on file    Active member of club or organization: Not on file    Attends meetings of clubs or organizations: Not on file    Relationship status: Not on file  Other Topics Concern  . Not on file  Social History Narrative  . Not on file     Family History:  The patient's family history includes Heart attack in his father and mother; Heart disease in his father, maternal grandmother, and mother; Hypertension in his father and sister.   ROS:   Please see the history of present illness.    ROS all other systems are reviewed and are negative   PHYSICAL EXAM:   VS:  BP 128/83   Pulse 77   Ht 6' (1.829 m)   Wt 199 lb (90.3 kg)   SpO2 98%   BMI 26.99 kg/m      General: Alert, oriented x3, no distress, healthy left subclavian device site Head: no evidence of trauma, PERRL, EOMI, no exophtalmos or lid lag, no myxedema, no xanthelasma; normal ears, nose and oropharynx.  Large spherical mass  behind the right angle of the mandible, elastic to palpation. Neck: normal jugular venous pulsations and no hepatojugular reflux; brisk carotid pulses without delay and no carotid bruits Chest: clear to auscultation, no signs of consolidation by percussion or palpation, normal fremitus, symmetrical and full respiratory excursions Cardiovascular: normal position and quality of the apical impulse, regular rhythm, normal first and second heart sounds, no murmurs, rubs or gallops Abdomen: no tenderness or distention, no masses by palpation, no abnormal pulsatility or arterial bruits, normal bowel sounds, no hepatosplenomegaly Extremities: no clubbing, cyanosis or edema; 2+ radial, ulnar and brachial pulses bilaterally; 2+ right femoral, posterior tibial and dorsalis pedis pulses; 2+ left femoral, posterior tibial and dorsalis pedis pulses; no subclavian or femoral bruits Neurological: grossly nonfocal Psych: Normal mood and affect    Wt Readings from Last 3 Encounters:  07/28/19 199 lb (90.3 kg)  07/01/19 198 lb (89.8 kg)  04/28/19 195 lb 6.4 oz (88.6 kg)      Studies/Labs Reviewed:   ECHO 04/28/2019   1. The left ventricle has severely reduced systolic function, with an ejection fraction of 25-30%. The cavity size was moderately dilated. Left ventricular diastolic function could not be evaluated.  2. The right ventricle has normal systolic function. The cavity was normal.  3. A bioprosthetic valve is present in the mitral position. Procedure Date: 07/11/2016.  4. The tricuspid valve is grossly normal.  5. The aortic valve is tricuspid. Mild thickening of the aortic valve. No stenosis of the aortic valve.  6. The aorta is abnormal in size and structure.  7. There is mild dilatation of the ascending aorta measuring 39 mm.  8. Global hypokinesis with akinesis of the inferior and inferolateral walls; overall severely reduced LV function; moderate LVE; mildy dilated ascending aorta; s/p MVR with  mean gradient of 5 mmHg  and no MR.   EKG:  EKG is not ordered today.  Intracardiac electrogram shows atrial sensed, biventricular paced rhythm  Recent Labs: 04/28/2019: ALT 22; TSH 1.963 05/20/2019: BUN 26; Creatinine, Ser 1.54; Potassium 4.6; Sodium 136   Lipid Panel    Component Value Date/Time   CHOL 120 10/01/2018 0925   TRIG 144 10/01/2018 0925   HDL 31 (L) 10/01/2018 0925   CHOLHDL 3.9 10/01/2018 0925   CHOLHDL 4.1 02/19/2018 1018   VLDL 32 02/19/2018 1018   LDLCALC 60 10/01/2018 0925    ASSESSMENT:    1. Chronic combined systolic and diastolic heart failure ([place])   2. Coronary artery disease of bypass graft of native heart with stable angina pectoris ([place])   3. Typical atrial flutter ([facility])   4. Status post mitral valve replacement with bioprosthetic valve   5. Presence of biventricular implantable cardioverter-defibrillator (ICD)   6. Essential hypertension   7. Hypercholesterolemia   8. Type 2 diabetes mellitus with stage 3b chronic kidney disease, without long-term current use of insulin ([place])   9. Submandibular swelling   10. History of renal artery stenosis   11. Need for immunization against influenza      PLAN:  In order of problems listed above:  1. CHF: NYHA functional class II, euvolemic on a very low-dose of loop diuretic as well as Entresto, carvedilol, eplerenone.   Tried to discontinue Lokelma, but had to restart it due to mild hyperkalemia.  He would like to "graduate" from the heart failure clinic.  I told him I discussed this with Dr. Aundra Dubin. 2. CAD: He does not have angina pectoris on a very low dose of long-acting nitrates. 3. AFlutter: Successful ablation without recurrence of antiarrhythmics.  Anticoagulation has been stopped. 4. S/P MVR: Bioprosthetic valve (31 mm Edwards magna bovine) with normal function by April 28, 2019 echo.  He has a refillable prescription for amoxicillin as needed for dental procedures and is aware of the need for  endocarditis prophylaxis 5. CRT-D: Normal device function.  Improved BiV pacing after the increased dose of beta-blocker. 6. Hx HTN: Well-controlled.  Reluctant to increase the dose of Entresto due to his problems with hyperkalemia. 7. HLP: On statin.  LDL 60,  well within desirable range, but HDL remains low at 31.  Encouraged physical activity.  He should go for walks even if he can't go to rehab. 8. DM: Most recent hemoglobin A1c 6.2%.  Should not be prescribed TZD's.  He sees Dr. Dorris Fetch. 9. CKD 3b: Most recent creatinine 1.54, corresponding to a GFR of just under 45; predisposes him to hyperkalemia while receiving Entresto and eplerenone. 10. History of left renal artery stenosis status post stent 2007.  Stable renal function and well-controlled hypertension. 11. Submandibular mass: We will refer him for evaluation with Dr. Lucia Gaskins, ENT.  From a cardiac point of view his risk for major complications is low to moderate.  He understands that surgical resection of the mass could be associated with cranial nerve palsy. 12. At his request he was administered a high-dose flu shot today.   Medication Adjustments/Labs and Tests Ordered: Current medicines are reviewed at length with the patient today.  Concerns regarding medicines are outlined above.  Medication changes, Labs and Tests ordered today are listed in the Patient Instructions below. Patient Instructions  Medication Instructions:  No changes *If you need a refill on your cardiac medications before your next appointment, please call your pharmacy*  Lab Work: None ordered If you have labs (  blood work) drawn today and your tests are completely normal, you will receive your results only by: [name] MyChart Message (if you have MyChart) OR . A paper copy in the mail If you have any lab test that is abnormal or we need to change your treatment, we will call you to review the results.  Testing/Procedures: None ordered  Follow-Up: At [facility], you and your health needs are our priority.  As part of our continuing mission to provide you with exceptional heart care, we have created designated Provider Care Teams.  These Care Teams include your primary Cardiologist (physician) and Advanced Practice Providers (APPs -  Physician Assistants and Nurse Practitioners) who all work together to provide you with the care you need, when you need it.  Your next appointment:   6 months  The format for your next appointment:   In Person  Provider:   Sanda Klein, MD  A referral has been placed for an ENT- Dr. Radene Journey    Signed, Sanda Klein, MD  07/31/2019 2:31 PM    [address], [place], [place]  [address] Phone: [phone]; Fax: [phone]

## 2019-07-28 NOTE — Patient Instructions (Addendum)
Medication Instructions:  No changes *If you need a refill on your cardiac medications before your next appointment, please call your pharmacy*  Lab Work: None ordered If you have labs (blood work) drawn today and your tests are completely normal, you will receive your results only by: [name] MyChart Message (if you have MyChart) OR . A paper copy in the mail If you have any lab test that is abnormal or we need to change your treatment, we will call you to review the results.  Testing/Procedures: None ordered  Follow-Up: At [facility], you and your health needs are our priority.  As part of our continuing mission to provide you with exceptional heart care, we have created designated Provider Care Teams.  These Care Teams include your primary Cardiologist (physician) and Advanced Practice Providers (APPs -  Physician Assistants and Nurse Practitioners) who all work together to provide you with the care you need, when you need it.  Your next appointment:   6 months  The format for your next appointment:   In Person  Provider:   Sanda Klein, MD  A referral has been placed for an ENT- Dr. Radene Journey

## 2019-07-29 ENCOUNTER — Encounter (HOSPITAL_COMMUNITY): Payer: Medicare Other | Admitting: Cardiology

## 2019-07-29 ENCOUNTER — Ambulatory Visit (INDEPENDENT_AMBULATORY_CARE_PROVIDER_SITE_OTHER): Payer: Medicare Other | Admitting: *Deleted

## 2019-07-29 DIAGNOSIS — I5023 Acute on chronic systolic (congestive) heart failure: Secondary | ICD-10-CM | POA: Diagnosis not present

## 2019-07-29 DIAGNOSIS — I5022 Chronic systolic (congestive) heart failure: Secondary | ICD-10-CM

## 2019-07-30 LAB — CUP PACEART REMOTE DEVICE CHECK
Battery Remaining Longevity: 45 mo
Battery Voltage: 2.95 V
Brady Statistic AP VP Percent: 64.55 %
Brady Statistic AP VS Percent: 1.56 %
Brady Statistic AS VP Percent: 33.18 %
Brady Statistic AS VS Percent: 0.71 %
Brady Statistic RA Percent Paced: 64.75 %
Brady Statistic RV Percent Paced: 92.94 %
Date Time Interrogation Session: 20201027052404
HighPow Impedance: 60 Ohm
Implantable Lead Implant Date: 20171024
Implantable Lead Implant Date: 20171024
Implantable Lead Implant Date: 20171024
Implantable Lead Location: 753858
Implantable Lead Location: 753859
Implantable Lead Location: 753860
Implantable Lead Model: 4598
Implantable Lead Model: 5076
Implantable Pulse Generator Implant Date: 20171024
Lead Channel Impedance Value: 222.34 Ohm
Lead Channel Impedance Value: 222.34 Ohm
Lead Channel Impedance Value: 230.327
Lead Channel Impedance Value: 237.5 Ohm
Lead Channel Impedance Value: 246.635
Lead Channel Impedance Value: 342 Ohm
Lead Channel Impedance Value: 418 Ohm
Lead Channel Impedance Value: 456 Ohm
Lead Channel Impedance Value: 456 Ohm
Lead Channel Impedance Value: 475 Ohm
Lead Channel Impedance Value: 475 Ohm
Lead Channel Impedance Value: 513 Ohm
Lead Channel Impedance Value: 589 Ohm
Lead Channel Impedance Value: 779 Ohm
Lead Channel Impedance Value: 817 Ohm
Lead Channel Impedance Value: 817 Ohm
Lead Channel Impedance Value: 817 Ohm
Lead Channel Impedance Value: 874 Ohm
Lead Channel Pacing Threshold Amplitude: 0.5 V
Lead Channel Pacing Threshold Amplitude: 0.875 V
Lead Channel Pacing Threshold Amplitude: 1 V
Lead Channel Pacing Threshold Pulse Width: 0.4 ms
Lead Channel Pacing Threshold Pulse Width: 0.4 ms
Lead Channel Pacing Threshold Pulse Width: 1 ms
Lead Channel Sensing Intrinsic Amplitude: 1.75 mV
Lead Channel Sensing Intrinsic Amplitude: 1.75 mV
Lead Channel Sensing Intrinsic Amplitude: 17 mV
Lead Channel Sensing Intrinsic Amplitude: 17 mV
Lead Channel Setting Pacing Amplitude: 1.5 V
Lead Channel Setting Pacing Amplitude: 2 V
Lead Channel Setting Pacing Amplitude: 2 V
Lead Channel Setting Pacing Pulse Width: 0.4 ms
Lead Channel Setting Pacing Pulse Width: 1 ms
Lead Channel Setting Sensing Sensitivity: 0.3 mV

## 2019-07-31 ENCOUNTER — Encounter: Payer: Self-pay | Admitting: Cardiovascular Disease

## 2019-08-20 NOTE — Progress Notes (Signed)
Remote ICD transmission.   

## 2019-08-23 ENCOUNTER — Other Ambulatory Visit (HOSPITAL_COMMUNITY): Payer: Self-pay | Admitting: Cardiology

## 2019-09-03 DIAGNOSIS — Z681 Body mass index (BMI) 19 or less, adult: Secondary | ICD-10-CM | POA: Diagnosis not present

## 2019-09-03 DIAGNOSIS — Z0001 Encounter for general adult medical examination with abnormal findings: Secondary | ICD-10-CM | POA: Diagnosis not present

## 2019-09-05 ENCOUNTER — Ambulatory Visit: Payer: Medicare Other | Admitting: "Endocrinology

## 2019-09-10 ENCOUNTER — Other Ambulatory Visit (HOSPITAL_COMMUNITY): Payer: Self-pay | Admitting: Cardiology

## 2019-09-10 ENCOUNTER — Other Ambulatory Visit: Payer: Self-pay | Admitting: "Endocrinology

## 2019-09-22 ENCOUNTER — Other Ambulatory Visit (HOSPITAL_COMMUNITY): Payer: Self-pay

## 2019-09-22 MED ORDER — LOKELMA 10 G PO PACK: 10.0000 g | PACK | Freq: Every day | ORAL | 0 refills | Status: DC

## 2019-10-01 ENCOUNTER — Encounter (HOSPITAL_COMMUNITY): Payer: Medicare Other | Admitting: Cardiology

## 2019-10-08 ENCOUNTER — Ambulatory Visit: Payer: Medicare Other | Admitting: "Endocrinology

## 2019-10-11 DIAGNOSIS — N1832 Chronic kidney disease, stage 3b: Secondary | ICD-10-CM | POA: Diagnosis not present

## 2019-10-11 DIAGNOSIS — E1122 Type 2 diabetes mellitus with diabetic chronic kidney disease: Secondary | ICD-10-CM | POA: Diagnosis not present

## 2019-10-11 DIAGNOSIS — R29818 Other symptoms and signs involving the nervous system: Secondary | ICD-10-CM | POA: Diagnosis not present

## 2019-10-11 DIAGNOSIS — Z95810 Presence of automatic (implantable) cardiac defibrillator: Secondary | ICD-10-CM | POA: Diagnosis not present

## 2019-10-11 DIAGNOSIS — Z9889 Other specified postprocedural states: Secondary | ICD-10-CM | POA: Diagnosis not present

## 2019-10-11 DIAGNOSIS — I5022 Chronic systolic (congestive) heart failure: Secondary | ICD-10-CM | POA: Diagnosis not present

## 2019-10-11 DIAGNOSIS — Z954 Presence of other heart-valve replacement: Secondary | ICD-10-CM | POA: Diagnosis not present

## 2019-10-11 DIAGNOSIS — R29810 Facial weakness: Secondary | ICD-10-CM | POA: Diagnosis not present

## 2019-10-11 DIAGNOSIS — I252 Old myocardial infarction: Secondary | ICD-10-CM | POA: Diagnosis not present

## 2019-10-11 DIAGNOSIS — I10 Essential (primary) hypertension: Secondary | ICD-10-CM | POA: Diagnosis not present

## 2019-10-11 DIAGNOSIS — I130 Hypertensive heart and chronic kidney disease with heart failure and stage 1 through stage 4 chronic kidney disease, or unspecified chronic kidney disease: Secondary | ICD-10-CM | POA: Diagnosis not present

## 2019-10-11 DIAGNOSIS — R4781 Slurred speech: Secondary | ICD-10-CM | POA: Diagnosis not present

## 2019-10-11 DIAGNOSIS — I160 Hypertensive urgency: Secondary | ICD-10-CM | POA: Diagnosis not present

## 2019-10-11 DIAGNOSIS — I4892 Unspecified atrial flutter: Secondary | ICD-10-CM | POA: Diagnosis not present

## 2019-10-11 DIAGNOSIS — I2510 Atherosclerotic heart disease of native coronary artery without angina pectoris: Secondary | ICD-10-CM | POA: Diagnosis present

## 2019-10-11 DIAGNOSIS — I639 Cerebral infarction, unspecified: Secondary | ICD-10-CM | POA: Diagnosis not present

## 2019-10-11 DIAGNOSIS — E1165 Type 2 diabetes mellitus with hyperglycemia: Secondary | ICD-10-CM | POA: Diagnosis not present

## 2019-10-11 DIAGNOSIS — Z951 Presence of aortocoronary bypass graft: Secondary | ICD-10-CM | POA: Diagnosis not present

## 2019-10-11 DIAGNOSIS — Z955 Presence of coronary angioplasty implant and graft: Secondary | ICD-10-CM | POA: Diagnosis not present

## 2019-10-11 DIAGNOSIS — Z832 Family history of diseases of the blood and blood-forming organs and certain disorders involving the immune mechanism: Secondary | ICD-10-CM | POA: Diagnosis not present

## 2019-10-11 DIAGNOSIS — G8191 Hemiplegia, unspecified affecting right dominant side: Secondary | ICD-10-CM | POA: Diagnosis not present

## 2019-10-11 DIAGNOSIS — Z79899 Other long term (current) drug therapy: Secondary | ICD-10-CM | POA: Diagnosis not present

## 2019-10-11 DIAGNOSIS — R079 Chest pain, unspecified: Secondary | ICD-10-CM | POA: Diagnosis not present

## 2019-10-11 DIAGNOSIS — I6350 Cerebral infarction due to unspecified occlusion or stenosis of unspecified cerebral artery: Secondary | ICD-10-CM | POA: Diagnosis not present

## 2019-10-11 DIAGNOSIS — R0789 Other chest pain: Secondary | ICD-10-CM | POA: Diagnosis not present

## 2019-10-11 DIAGNOSIS — E785 Hyperlipidemia, unspecified: Secondary | ICD-10-CM | POA: Diagnosis present

## 2019-10-11 DIAGNOSIS — I429 Cardiomyopathy, unspecified: Secondary | ICD-10-CM | POA: Diagnosis not present

## 2019-10-11 DIAGNOSIS — F419 Anxiety disorder, unspecified: Secondary | ICD-10-CM | POA: Diagnosis present

## 2019-10-11 DIAGNOSIS — Z888 Allergy status to other drugs, medicaments and biological substances status: Secondary | ICD-10-CM | POA: Diagnosis not present

## 2019-10-11 DIAGNOSIS — R221 Localized swelling, mass and lump, neck: Secondary | ICD-10-CM | POA: Diagnosis not present

## 2019-10-14 ENCOUNTER — Encounter (HOSPITAL_COMMUNITY): Payer: Self-pay

## 2019-10-15 NOTE — Telephone Encounter (Signed)
Patients wife reports patient was seen in the ER for stroke at [facility]. Reports they wanted to do an MRI to locate exactly where a bleed could be, however a medtronic rep was not available to accompany patient to MRI machine. Was told this could be done as an outpatient along with repeat labs. May need to stop blood thinner per ER MD  Hospital follow up 1/15 @1120  with DM to further discuss

## 2019-10-16 ENCOUNTER — Telehealth (HOSPITAL_COMMUNITY): Payer: Self-pay

## 2019-10-16 NOTE — Telephone Encounter (Signed)

## 2019-10-17 ENCOUNTER — Ambulatory Visit (HOSPITAL_COMMUNITY)
Admission: RE | Admit: 2019-10-17 | Discharge: 2019-10-17 | Disposition: A | Discharge status: Home / Self Care | Payer: Medicare Other | Source: Ambulatory Visit | Attending: Cardiology | Admitting: Cardiology

## 2019-10-17 ENCOUNTER — Other Ambulatory Visit: Payer: Self-pay

## 2019-10-17 ENCOUNTER — Encounter: Payer: Self-pay | Admitting: Neurology

## 2019-10-17 ENCOUNTER — Encounter (HOSPITAL_COMMUNITY): Payer: Self-pay | Admitting: Cardiology

## 2019-10-17 VITALS — BP 138/79 | HR 66 | Wt 202.0 lb

## 2019-10-17 DIAGNOSIS — I252 Old myocardial infarction: Secondary | ICD-10-CM | POA: Insufficient documentation

## 2019-10-17 DIAGNOSIS — Z953 Presence of xenogenic heart valve: Secondary | ICD-10-CM | POA: Diagnosis not present

## 2019-10-17 DIAGNOSIS — E785 Hyperlipidemia, unspecified: Secondary | ICD-10-CM | POA: Insufficient documentation

## 2019-10-17 DIAGNOSIS — Z8249 Family history of ischemic heart disease and other diseases of the circulatory system: Secondary | ICD-10-CM | POA: Insufficient documentation

## 2019-10-17 DIAGNOSIS — Z8673 Personal history of transient ischemic attack (TIA), and cerebral infarction without residual deficits: Secondary | ICD-10-CM | POA: Diagnosis not present

## 2019-10-17 DIAGNOSIS — Z7982 Long term (current) use of aspirin: Secondary | ICD-10-CM | POA: Insufficient documentation

## 2019-10-17 DIAGNOSIS — Z79890 Hormone replacement therapy (postmenopausal): Secondary | ICD-10-CM | POA: Insufficient documentation

## 2019-10-17 DIAGNOSIS — N183 Chronic kidney disease, stage 3 unspecified: Secondary | ICD-10-CM | POA: Insufficient documentation

## 2019-10-17 DIAGNOSIS — E1121 Type 2 diabetes mellitus with diabetic nephropathy: Secondary | ICD-10-CM | POA: Diagnosis not present

## 2019-10-17 DIAGNOSIS — I2510 Atherosclerotic heart disease of native coronary artery without angina pectoris: Secondary | ICD-10-CM | POA: Insufficient documentation

## 2019-10-17 DIAGNOSIS — I442 Atrioventricular block, complete: Secondary | ICD-10-CM | POA: Diagnosis not present

## 2019-10-17 DIAGNOSIS — Z79899 Other long term (current) drug therapy: Secondary | ICD-10-CM | POA: Insufficient documentation

## 2019-10-17 DIAGNOSIS — I4892 Unspecified atrial flutter: Secondary | ICD-10-CM | POA: Diagnosis not present

## 2019-10-17 DIAGNOSIS — Z95810 Presence of automatic (implantable) cardiac defibrillator: Secondary | ICD-10-CM | POA: Insufficient documentation

## 2019-10-17 DIAGNOSIS — E782 Mixed hyperlipidemia: Secondary | ICD-10-CM

## 2019-10-17 DIAGNOSIS — I255 Ischemic cardiomyopathy: Secondary | ICD-10-CM | POA: Diagnosis not present

## 2019-10-17 DIAGNOSIS — Z7902 Long term (current) use of antithrombotics/antiplatelets: Secondary | ICD-10-CM | POA: Diagnosis not present

## 2019-10-17 DIAGNOSIS — Z888 Allergy status to other drugs, medicaments and biological substances status: Secondary | ICD-10-CM | POA: Diagnosis not present

## 2019-10-17 DIAGNOSIS — E1122 Type 2 diabetes mellitus with diabetic chronic kidney disease: Secondary | ICD-10-CM | POA: Insufficient documentation

## 2019-10-17 DIAGNOSIS — I483 Typical atrial flutter: Secondary | ICD-10-CM

## 2019-10-17 DIAGNOSIS — Z951 Presence of aortocoronary bypass graft: Secondary | ICD-10-CM | POA: Diagnosis not present

## 2019-10-17 DIAGNOSIS — I25708 Atherosclerosis of coronary artery bypass graft(s), unspecified, with other forms of angina pectoris: Secondary | ICD-10-CM | POA: Diagnosis not present

## 2019-10-17 DIAGNOSIS — I5022 Chronic systolic (congestive) heart failure: Secondary | ICD-10-CM | POA: Diagnosis not present

## 2019-10-17 DIAGNOSIS — E039 Hypothyroidism, unspecified: Secondary | ICD-10-CM | POA: Insufficient documentation

## 2019-10-17 DIAGNOSIS — N1831 Chronic kidney disease, stage 3a: Secondary | ICD-10-CM

## 2019-10-17 LAB — LIPID PANEL
Cholesterol: 122 mg/dL (ref 0–200)
HDL: 37 mg/dL — ABNORMAL LOW (ref >40)
LDL Cholesterol: 69 mg/dL (ref 0–99)
Total CHOL/HDL Ratio: 3.3 RATIO
Triglycerides: 78 mg/dL (ref <150)
VLDL: 16 mg/dL (ref 0–40)

## 2019-10-17 LAB — COMPREHENSIVE METABOLIC PANEL
ALT: 15 U/L (ref 0–44)
AST: 13 U/L — ABNORMAL LOW (ref 15–41)
Albumin: 3.9 g/dL (ref 3.5–5.0)
Alkaline Phosphatase: 61 U/L (ref 38–126)
Anion gap: 11 (ref 5–15)
BUN: 28 mg/dL — ABNORMAL HIGH (ref 8–23)
CO2: 22 mmol/L (ref 22–32)
Calcium: 9.3 mg/dL (ref 8.9–10.3)
Chloride: 105 mmol/L (ref 98–111)
Creatinine, Ser: 1.44 mg/dL — ABNORMAL HIGH (ref 0.61–1.24)
GFR calc Af Amer: 56 mL/min — ABNORMAL LOW (ref >60)
GFR calc non Af Amer: 49 mL/min — ABNORMAL LOW (ref >60)
Glucose, Bld: 148 mg/dL — ABNORMAL HIGH (ref 70–99)
Potassium: 4.5 mmol/L (ref 3.5–5.1)
Sodium: 138 mmol/L (ref 135–145)
Total Bilirubin: 1.2 mg/dL (ref 0.3–1.2)
Total Protein: 7.2 g/dL (ref 6.5–8.1)

## 2019-10-17 LAB — CBC
HCT: 46.7 % (ref 39.0–52.0)
Hemoglobin: 15.2 g/dL (ref 13.0–17.0)
MCH: 30.3 pg (ref 26.0–34.0)
MCHC: 32.5 g/dL (ref 30.0–36.0)
MCV: 93.2 fL (ref 80.0–100.0)
Platelets: 177 10*3/uL (ref 150–400)
RBC: 5.01 MIL/uL (ref 4.22–5.81)
RDW: 12.6 % (ref 11.5–15.5)
WBC: 9.3 10*3/uL (ref 4.0–10.5)
nRBC: 0 % (ref 0.0–0.2)

## 2019-10-17 LAB — HEMOGLOBIN A1C
Hgb A1c MFr Bld: 7.2 % — ABNORMAL HIGH (ref 4.8–5.6)
Mean Plasma Glucose: 159.94 mg/dL

## 2019-10-17 LAB — BRAIN NATRIURETIC PEPTIDE: B Natriuretic Peptide: 4254.3 pg/mL — ABNORMAL HIGH (ref 0.0–100.0)

## 2019-10-17 LAB — T4, FREE: Free T4: 1.32 ng/dL — ABNORMAL HIGH (ref 0.61–1.12)

## 2019-10-17 LAB — TSH: TSH: 4.227 u[IU]/mL (ref 0.350–4.500)

## 2019-10-17 MED ORDER — FUROSEMIDE 20 MG PO TABS: 20.0000 mg | ORAL_TABLET | Freq: Every day | ORAL | 6 refills | Status: DC

## 2019-10-17 MED ORDER — NITROGLYCERIN 0.4 MG SL SUBL: SUBLINGUAL_TABLET | SUBLINGUAL | 6 refills | Status: DC

## 2019-10-17 MED ORDER — FUROSEMIDE 20 MG PO TABS: 20.0000 mg | ORAL_TABLET | Freq: Every day | ORAL | 6 refills | Status: DC | PRN

## 2019-10-17 MED ORDER — EPLERENONE 25 MG PO TABS: 25.0000 mg | ORAL_TABLET | Freq: Every day | ORAL | 6 refills | Status: DC

## 2019-10-17 NOTE — Progress Notes (Unsigned)
ReDS Vest / Clip - 10/17/19 1200      ReDS Vest / Clip   Station Marker  D    Ruler Value  33    ReDS Value Range  Low volume    ReDS Actual Value  29    Anatomical Comments  sitting

## 2019-10-17 NOTE — Patient Instructions (Addendum)
START Lasix 20mg  (1 tab) as needed  Refills were sent for Eplerenone and Nitroglycerin   Labs today and repeat in 10 days We will only contact you if something comes back abnormal or we need to make some changes. Otherwise no news is good news!   You have been referred to Neurology at [facility].  They will call you to schedule this appointment.   Your physician recommends that you schedule a follow-up appointment in: Keep your appointment next month with Dr Aundra Dubin.  The garage code for February is 6008    Please call office at [phone] option [phone] if you have any questions or concerns.    At the [facility], you and your health needs are our priority. As part of our continuing mission to provide you with exceptional heart care, we have created designated Provider Care Teams. These Care Teams include your primary Cardiologist (physician) and Advanced Practice Providers (APPs- Physician Assistants and Nurse Practitioners) who all work together to provide you with the care you need, when you need it.   You may see any of the following providers on your designated Care Team at your next follow up: [name] Dr Glori Bickers . Dr Loralie Champagne . Darrick Grinder, NP . Lyda Jester, PA . Audry Riles, PharmD   Please be sure to bring in all your medications bottles to every appointment.

## 2019-10-17 NOTE — Progress Notes (Signed)
Medical records request sent to [facility] health.

## 2019-10-18 NOTE — Progress Notes (Signed)
Date:  10/18/2019   ID:  Christian Bowman, DOB [birth year], MRN [id number]   Provider location: [facility] Type of Visit: Established patient   PCP:  Sharilyn Sites, MD  Cardiologist: Dr. Sallyanne Kuster  HF Cardiologist:  Loralie Champagne, MD  Chief Complaint: Shortness of breath   History of Present Illness: Christian Bowman is a 74 y.o. male with history of CAD s/p CABG in 2007 then redo CABG in 10/17 with mitral valve replacement.  He was admitted in 10/17 for redo CABG with SVG-PDA and SVG-ramus.  He also had mitral valve replacement with a bioprosthetic valve because of infarct-related mitral regurgitation.  Post-operative course was complicated by CHF requiring diuresis.  He also had atrial flutter and required DCCV.  Due to complete heart block, he later got a CRT-D system.    At a prior visit, he was in atrial flutter.  He saw Dr. Curt Bears, it was decided to arrange for DCCV with ablation down the road when PPM leads have been in longer.  He had successful DCCV in 12/17 and is in NSR today.   He was admitted in 3/18 with NSTEMI and chest pain. TnI only 0.5.  LHC showed occlusion of SVG-PDA from CABG#1 to be the likely culprit.  However, SVG-PDA from CABG#2 was patent.  No intervention.  Echo in 3/18 from [place] showed EF 30-35%, stable bioprosthetic mitral valve.   He had atrial flutter ablation in 8/18.  He is in NSR today and is off amiodarone.    In 11/18, he had an inguinal hernia repair. Post-op, he had an NSTEMI.  LHC showed occlusion of a PLV branch that had been backfilled by SVG-PDA (prior cath had shown severe diffuse disease in the PLV).  Echo in 11/18 showed EF 20-25% with normal bioprosthetic mitral valve.    With no recurrence of atrial arrhythmias, he is now off anticoagulation.   Echo in 7/20 showed EF 25-30%, bioprosthetic MV with no MR and mean gradient 5 mmHg, normal RV.    Last week, he developed right hand weakness and dysarthria concerning for  CVA.  He went to the ER in [place] and had tPA.  Symptoms resolved.  Atrial fibrillation was not noted per his wife's report.  He had a head CT (no MRI due to ICD), echo, and probably carotid dopplers but I do not have any of these reports.  He was started on Plavix 75 mg daily and sent home.    He returns for followup of CHF. Weight is up 7 lbs, which he attributes to lack of exercise (cardiac rehab closed, he had been doing maintenance regularly).  His right hand weakness from CVA last week has resolved, and he does not have dysarthria unless he is tired.  He is minimally short of breath walking around and doing his usual activities.  However, he notes dyspnea at night, can happen "all of a sudden."  Not clearly orthopnea, and he actually thinks that it is related to anxiety since his CVA.  Breast pain has resolved off spironolactone.   REDS clip: 29%  ECG (personally reviewed): NSR, BiV pacing with PACs  Medtronic device interrogation: Thoracic impedance stable, no atrial fibrillation or flutter noted.     Labs (11/17): K 4.8, creatinine 1.89, hgb 12.3, digoxin 0.9, LFTs normal, TSH 8.235 (mild increase), free T3 low, free T4 normal, LFTs normal.  Labs (12/17): K 4.5, creatinine 1.7, BNP 3909 Labs (3/18): K 3.9, creatinine 1.48,  hgb 11.3 Labs (4/18): digoxin 0.5, LFTs normal Labs (7/18): K 5.1, creatinine 1.78, LDL 63, HDL 29, TSH elevated Labs (11/18): K 4.5, creatinine 1.86, hgb 13.6 Labs (12/18): K 5, creatinine 1.88 Labs (1/19): digoxin 0.3, K 4.7, creatinine 1.83 Labs (6/19): LDL 70, HDL 32, K 5.1, creatinine 1.88 Labs (12/19): LDL 60, HDL 31 Labs (1/20): K 5.1, creatinine 1.64 Labs (4/20): K 4.5, creatinine 1.77 Labs (8/20): K 4.6, creatinine 1.5  PMH: 1. CAD: CABG 2007.  - LHC (9/17) with patent LIMA-LAD, totally occluded SVG-D, severe stenosis in SVG-PDA.  - Redo CABG 10/17 with SVG-PDA, SVG-ramus and mitral valve replacement.  - NSTEMI 3/18.  LHC with culprit lesion likely  occlusion of SVG-PDA from CABG#1.  Patent SVG-PDA from CABG#2.  No intervention.  - NSTEMI 11/18 post-op inguinal hernia repair.  LHC with occlusion of a PLV branch that had been backfilled by SVG-PDA (prior cath had shown severe diffuse disease in the PLV).  2. Mitral regurgitation: Ischemic MR, mitral valve replacement was done in 10/17 (bioprosthetic). 3. Complete heart block: Post-op in 10/17.  Medtronic CRT-D placed.  4. Atrial flutter: DCCV 10/17 and again in 12/17.  - Ablation 8/18, now off amiodarone.  5. Type II diabetes 6. Hyperlipidemia 7. Chronic systolic CHF: Ischemic cardiomyopathy.   - TEE (10/17): EF 20-25%, severe LV dilation - Echo (3/18, [place]): EF 30-35%, bioprosthetic mitral valve with mean gradient 5 mmHg.  - Echo (11/18): EF 20-25%, moderate LV dilation, normal bioprosthetic mitral valve.  - Echo (7/20): EF 25-30%, moderate LV dilation, inferior/inferolateral AK, bioprosthetic MV with mean gradient 5 and no MR, normal RV.  - Painful gynecomastia with spironolactone.  8. Hypothyroidism 9. CVA 1/21  Current Outpatient Medications  Medication Sig Dispense Refill  . amoxicillin (AMOXIL) 500 MG tablet Take 4 tablets (2,000 mg total) by mouth as needed. Pre-dental work only. 4 tablet 3  . aspirin EC 81 MG tablet Take 81 mg by mouth daily.     . carvedilol (COREG) 12.5 MG tablet Take 1.5 tablets (18.75 mg total) by mouth 2 (two) times daily. 180 tablet 3  . clopidogrel (PLAVIX) 75 MG tablet Take 75 mg by mouth daily.    [name] ENTRESTO 49-51 MG Take 1 tablet by mouth twice daily 180 tablet 0  . eplerenone (INSPRA) 25 MG tablet Take 1 tablet (25 mg total) by mouth daily. 30 tablet 6  . furosemide (LASIX) 20 MG tablet Take 1 tablet (20 mg total) by mouth daily as needed. 15 tablet 6  . isosorbide mononitrate (IMDUR) 30 MG 24 hr tablet Take 1 tablet (30 mg total) by mouth as needed. 45 tablet 2  . levothyroxine (SYNTHROID) 75 MCG tablet TAKE 1 TABLET BY MOUTH ONCE DAILY BEFORE  BREAKFAST 90 tablet 0  . Melatonin 1 MG CAPS Take by mouth.    . neomycin-bacitracin-polymyxin (NEOSPORIN) ointment Apply 1 application topically daily as needed for wound care.    . nitroGLYCERIN (NITROSTAT) 0.4 MG SL tablet DISSOLVE ONE TABLET UNDER THE TONGUE EVERY 5 MINUTES AS NEEDED FOR CHEST PAIN.  DO NOT EXCEED A TOTAL OF 3 DOSES IN 15 MINUTES 25 tablet 6  . polyethylene glycol (MIRALAX / GLYCOLAX) packet Take 17 g by mouth daily as needed for mild constipation or moderate constipation.    . rosuvastatin (CRESTOR) 10 MG tablet Take 1 tablet (10 mg total) by mouth daily. TAKE 1 TABLET BY MOUTH ONCE DAILY IN THE EVENING 90 tablet 1  . sodium zirconium cyclosilicate (LOKELMA) 10 g PACK  packet Take 10 g by mouth daily. 90 each 0   No current facility-administered medications for this encounter.    Allergies:   Xanax [alprazolam]   Social History:  The patient  reports that he has never smoked. He has never used smokeless tobacco. He reports that he does not drink alcohol or use drugs.   Family History:  The patient's family history includes Heart attack in his father and mother; Heart disease in his father, maternal grandmother, and mother; Hypertension in his father and sister.   ROS:  Please see the history of present illness.   All other systems are personally reviewed and negative.   Exam:   BP 138/79   Pulse 66   Wt 91.6 kg (202 lb)   SpO2 97%   BMI 27.40 kg/m  General: NAD Neck: JVP 8 cm, no thyromegaly or thyroid nodule.  Lungs: Clear to auscultation bilaterally with normal respiratory effort. CV: Nondisplaced PMI.  Heart regular S1/S2, no S3/S4, no murmur.  No peripheral edema.  No carotid bruit.  Normal pedal pulses.  Abdomen: Soft, nontender, no hepatosplenomegaly, no distention.  Skin: Intact without lesions or rashes.  Neurologic: Alert and oriented x 3.  Psych: Normal affect. Extremities: No clubbing or cyanosis.  HEENT: Normal.   Recent Labs: 10/17/2019: ALT 15;  B Natriuretic Peptide 4,254.3; BUN 28; Creatinine, Ser 1.44; Hemoglobin 15.2; Platelets 177; Potassium 4.5; Sodium 138; TSH 4.227  Personally reviewed   Wt Readings from Last 3 Encounters:  10/17/19 91.6 kg (202 lb)  07/28/19 90.3 kg (199 lb)  07/01/19 89.8 kg (198 lb)      ASSESSMENT AND PLAN:  1. Chronic systolic CHF: Ischemic cardiomyopathy.  TEE 10/17 with EF 20-25%.  Has Medtronic CRT-D system. Today's echo was reviewed, showed EF 25-30% with normal RV.  Weight is up but I think this is due to lack of exercise and not volume retention.  He is not volume overloaded by exam, Optivol, or REDS clip.  I think the nocturnal dyspnea may be anxiety-related.   - Continue to use Lasix prn.    - Continue Entresto 49/51 bid.  Would not increase with history of orthostatic-type symptoms.  BMET today.  - Continue eplerenone 25 mg daily.  Gynecomastia has resolved off spironolactone.  - Continue Coreg 18.75 mg bid.    - He has been on Lokelma chronically to control hyperkalemia and allow use of spironolactone and Entresto.  2. CAD: s/p redo CABG. Admission in 3/18 with NSTEMI, LHC showed occlusion of SVG-PDA from CABG#1 but patent SVG-PDA from CABG#2, no intervention.  He had a post-op NSTEMI after inguinal hernia repair in 11/18.  LHC showed occlusion of a PLV branch that had been backfilled by SVG-PDA (prior cath had shown severe diffuse disease in the PLV). No recent chest pain.     - Continue statin => Crestor 10 mg daily. Check lipids today.  - Restart cardiac rehab maintenance when coronavirus restrictions are lifted.    - Continue ASA 81. - Avoid future elective surgery.  3. Bioprosthetic mitral valve: Stable on last echo.  4. Atrial flutter: s/p ablation in 8/18.  He is in NSR and off amiodarone.  - Now off warfarin with flutter ablation and no recurrence. If he has recurrence of atrial arrhythmias, based on most recent data DOAC would be a reasonable choice for him even with bioprosthetic  MV.  5. CKD: Stage III.  BMET.   6. Type II diabetes: He would be a good candidate for  an SGLT-2 inhibitor in the future.  7. CVA: In 1/21, got tPA at the ER in [place].  Symptoms resolved.  No records from hospitalization.  - Device interrogation today showed no atrial fibrillation. If atrial fibrillation is noted on future monitoring, will need anticoagulation.  - I will call for records from [place].  If he has not had carotid dopplers, he will need them done.  - I will refer him to [facility].  - Continue ASA and Plavix 75 daily.  - Continue Crestor.   He has a followup with me on 11/24/19.     Signed, Loralie Champagne, MD  10/18/2019  [facility] [address] [facility] [place] [address] [phone] (office) [phone] (fax)

## 2019-10-23 ENCOUNTER — Encounter: Payer: Self-pay | Admitting: "Endocrinology

## 2019-10-23 ENCOUNTER — Ambulatory Visit (INDEPENDENT_AMBULATORY_CARE_PROVIDER_SITE_OTHER): Payer: Medicare Other | Admitting: "Endocrinology

## 2019-10-23 DIAGNOSIS — E1121 Type 2 diabetes mellitus with diabetic nephropathy: Secondary | ICD-10-CM | POA: Diagnosis not present

## 2019-10-23 DIAGNOSIS — E039 Hypothyroidism, unspecified: Secondary | ICD-10-CM

## 2019-10-23 DIAGNOSIS — E782 Mixed hyperlipidemia: Secondary | ICD-10-CM | POA: Diagnosis not present

## 2019-10-23 DIAGNOSIS — I25708 Atherosclerosis of coronary artery bypass graft(s), unspecified, with other forms of angina pectoris: Secondary | ICD-10-CM | POA: Diagnosis not present

## 2019-10-23 DIAGNOSIS — N1831 Chronic kidney disease, stage 3a: Secondary | ICD-10-CM

## 2019-10-23 DIAGNOSIS — E1122 Type 2 diabetes mellitus with diabetic chronic kidney disease: Secondary | ICD-10-CM

## 2019-10-23 MED ORDER — LEVOTHYROXINE SODIUM 50 MCG PO TABS: 50.0000 ug | ORAL_TABLET | Freq: Every day | ORAL | 3 refills | Status: DC

## 2019-10-23 MED ORDER — LINAGLIPTIN 5 MG PO TABS: 5.0000 mg | ORAL_TABLET | Freq: Every day | ORAL | 1 refills | Status: DC

## 2019-10-23 NOTE — Progress Notes (Signed)
10/23/2019                                                                 Endocrinology Telehealth Visit Follow up Note -During COVID -19 Pandemic  I connected with Christian Bowman on 10/23/2019   by telephone and verified that I am speaking with the correct person using two identifiers. Christian Bowman, [birth year]. he has verbally consented to this visit. All issues noted in this document were discussed and addressed. The format was not optimal for physical exam.    Subjective:    Patient ID: Christian Bowman, male    DOB: 06-14-1946,    Past Medical History:  Diagnosis Date  . AICD (automatic cardioverter/defibrillator) present    medtronic-   DR. CROITORU , DR. Aundra Dubin   . Anginal pain ([place])    cp sat 08/11/17  . Anxiety   . CAD (coronary artery disease)   . CHF (congestive heart failure) ([place])   . Complication of anesthesia    took awhile to wake up   . Coronary artery disease involving coronary bypass graft   . Cyst of neck    right side  . DM2 (diabetes mellitus, type 2) ([place]) 08/26/2013  . Dyspnea   . Heart attack ([place])    "not sure when" (08/20/2017)  . HTN (hypertension) 08/26/2013  . Hyperlipidemia 08/26/2013  . Hypothyroidism   . Left main coronary artery disease   . Left renal artery stenosis ([place])    Genesis 6x12 stent 2007  . Obesity (BMI 30.0-34.9) 08/26/2013  . Postoperative atrial fibrillation ([place]) 10/15/2005  . Presence of permanent cardiac pacemaker   . S/P CABG x 4 10/13/2005   LIMA to LAD, SVG to intermediate branch, sequential SVG to PDA and RPL branch, EVH via right thigh  . S/P mitral valve replacement with bioprosthetic valve 07/11/2016   31 mm [facility] Mitral bovine bioprosthetic tissue valve  . S/P redo CABG x 2 07/11/2016   SVG to PDA and SVG to Intermediate Branch, EVH via left thigh   Past Surgical History:  Procedure Laterality Date  . A-FLUTTER ABLATION N/A 05/11/2017   Procedure: A-Flutter Ablation;  Surgeon: Constance Haw, MD;   Location: [place] [facility];  Service: Cardiovascular;  Laterality: N/A;  . CARDIAC CATHETERIZATION N/A 06/21/2016   Procedure: Right/Left Heart Cath and Coronary/Graft Angiography;  Surgeon: Sherren Mocha, MD;  Location: [place] [facility];  Service: Cardiovascular;  Laterality: N/A;  . CARDIAC VALVE REPLACEMENT    . CARDIOVERSION N/A 07/19/2016   Procedure: CARDIOVERSION;  Surgeon: Lelon Perla, MD;  Location: [facility];  Service: Cardiovascular;  Laterality: N/A;  . CARDIOVERSION N/A 09/08/2016   Procedure: CARDIOVERSION;  Surgeon: Fay Records, MD;  Location: [place];  Service: Cardiovascular;  Laterality: N/A;  . CORONARY ANGIOPLASTY     STENT 2016  [place]     DES in SVG to right coronary artery system  . CORONARY ARTERY BYPASS GRAFT  10/13/2005   LIMA to LAD, SVG to intermediate branch, sequential SVG to PDA and RPL  . CORONARY ARTERY BYPASS GRAFT N/A 07/11/2016   Procedure: REDO CORONARY ARTERY BYPASS GRAFTING (CABG) x two using left leg greater saphenous vein harvested endoscopically-SVG  to PDA -SVG to RAMUS INTERMEDIATE;  Surgeon: Rexene Alberts, MD;  Location: [place];  Service: Open Heart Surgery;  Laterality: N/A;  . CORONARY ARTERY BYPASS GRAFT N/A 07/11/2016   Procedure: Re-exploration (CABG) for post op bleeding,;  Surgeon: Rexene Alberts, MD;  Location: [facility];  Service: Open Heart Surgery;  Laterality: N/A;  . EP IMPLANTABLE DEVICE N/A 07/25/2016   Procedure: BiV ICD Insertion CRT-D;  Surgeon: Will Meredith Leeds, MD;  Location: [place] [facility];  Service: Cardiovascular;  Laterality: N/A;  . INGUINAL HERNIA REPAIR Left 08/20/2017  . INGUINAL HERNIA REPAIR Left 08/20/2017   Procedure: OPEN LEFT INGUINAL HERNIA REPAIR;  Surgeon: Alphonsa Overall, MD;  Location: [facility];  Service: General;  Laterality: Left;  . LEFT HEART CATH AND CORS/GRAFTS ANGIOGRAPHY N/A 12/18/2016   Procedure: Left Heart Cath and Cors/Grafts  Angiography;  Surgeon: Sherren Mocha, MD;  Location: [place] [facility];  Service: Cardiovascular;  Laterality: N/A;  . LEFT HEART CATH AND CORS/GRAFTS ANGIOGRAPHY N/A 08/22/2017   Procedure: LEFT HEART CATH AND CORS/GRAFTS ANGIOGRAPHY;  Surgeon: Larey Dresser, MD;  Location: [place] [facility];  Service: Cardiovascular;  Laterality: N/A;  . MITRAL VALVE REPLACEMENT N/A 07/11/2016   Procedure: MITRAL VALVE (MV) REPLACEMENT;  Surgeon: Rexene Alberts, MD;  Location: [place];  Service: Open Heart Surgery;  Laterality: N/A;  . MYOCARDICAL PERFUSION  10/09/2007   NORMAL PERFUSION IN ALL REGIONS;NO EVIDENCE OF INDUCIBLE ISCHEMIA;POST STRESS EF% 66  . RENAL ARTERY STENT Right 2007  . RENAL DOPPLER  03/28/2010   RIGHT RA-NORMAL;LEFT PROXIMAL RA AT STENT-PATENT WITH NO EVIDENCE OF SIGN DIAMETER REDUCTION. R & L KIDNEYS: EQUAL IN SIZE,SYMMETRICAL IN SHAPE.  . TEE WITHOUT CARDIOVERSION N/A 06/15/2016   Procedure: TRANSESOPHAGEAL ECHOCARDIOGRAM (TEE);  Surgeon: Sanda Klein, MD;  Location: [facility] ENDOSCOPY;  Service: Cardiovascular;  Laterality: N/A;  . TEE WITHOUT CARDIOVERSION N/A 07/11/2016   Procedure: TRANSESOPHAGEAL ECHOCARDIOGRAM (TEE);  Surgeon: Rexene Alberts, MD;  Location: [place];  Service: Open Heart Surgery;  Laterality: N/A;  . TEE WITHOUT CARDIOVERSION N/A 07/19/2016   Procedure: TRANSESOPHAGEAL ECHOCARDIOGRAM (TEE);  Surgeon: Lelon Perla, MD;  Location: [facility] ENDOSCOPY;  Service: Cardiovascular;  Laterality: N/A;  . TRANSESOPHAGEAL ECHOCARDIOGRAM  10/19/2005   NORMAL LV; MILD TO MODERATE AMOUNT OF SOFT ATHEROMATOUS PLAQUE OF THE THORACIC AORTA; THE LEFT ATRIUM IS MILDLY DILATED;LEFT ATRIAL APPENDAGE FUNCTION IS NORMAL;NO THROMBUS IDENTIFIED. SMALL PFO WITH RIGHT TO LEFT SHUNT   Social History   Socioeconomic History  . Marital status: Married    Spouse name: Not on file  . Number of children: Not on file  . Years of education: Not on file  . Highest education level: Not on file   Occupational History  . Not on file  Tobacco Use  . Smoking status: Never Smoker  . Smokeless tobacco: Never Used  Substance and Sexual Activity  . Alcohol use: No  . Drug use: No  . Sexual activity: Never  Other Topics Concern  . Not on file  Social History Narrative  . Not on file   Social Determinants of Health   Financial Resource Strain:   . Difficulty of Paying Living Expenses: Not on file  Food Insecurity:   . Worried About Charity fundraiser in the Last Year: Not on file  . Ran Out of Food in the Last Year: Not on file  Transportation Needs:   . Lack of Transportation (Medical): Not on file  . Lack of Transportation (  Non-Medical): Not on file  Physical Activity:   . Days of Exercise per Week: Not on file  . Minutes of Exercise per Session: Not on file  Stress:   . Feeling of Stress : Not on file  Social Connections:   . Frequency of Communication with Friends and Family: Not on file  . Frequency of Social Gatherings with Friends and Family: Not on file  . Attends Religious Services: Not on file  . Active Member of Clubs or Organizations: Not on file  . Attends Archivist Meetings: Not on file  . Marital Status: Not on file   Outpatient Encounter Medications as of 10/23/2019  Medication Sig  . amoxicillin (AMOXIL) 500 MG tablet Take 4 tablets (2,000 mg total) by mouth as needed. Pre-dental work only.  [name] aspirin EC 81 MG tablet Take 81 mg by mouth daily.   . carvedilol (COREG) 12.5 MG tablet Take 1.5 tablets (18.75 mg total) by mouth 2 (two) times daily.  . clopidogrel (PLAVIX) 75 MG tablet Take 75 mg by mouth daily.  [name] ENTRESTO 49-51 MG Take 1 tablet by mouth twice daily  . eplerenone (INSPRA) 25 MG tablet Take 1 tablet (25 mg total) by mouth daily.  . furosemide (LASIX) 20 MG tablet Take 1 tablet (20 mg total) by mouth daily as needed.  . isosorbide mononitrate (IMDUR) 30 MG 24 hr tablet Take 1 tablet (30 mg total) by mouth as needed.  [name]  levothyroxine (SYNTHROID) 50 MCG tablet Take 1 tablet (50 mcg total) by mouth daily before breakfast.  . linagliptin (TRADJENTA) 5 MG TABS tablet Take 1 tablet (5 mg total) by mouth daily.  . Melatonin 1 MG CAPS Take by mouth.  . neomycin-bacitracin-polymyxin (NEOSPORIN) ointment Apply 1 application topically daily as needed for wound care.  . nitroGLYCERIN (NITROSTAT) 0.4 MG SL tablet DISSOLVE ONE TABLET UNDER THE TONGUE EVERY 5 MINUTES AS NEEDED FOR CHEST PAIN.  DO NOT EXCEED A TOTAL OF 3 DOSES IN 15 MINUTES  . polyethylene glycol (MIRALAX / GLYCOLAX) packet Take 17 g by mouth daily as needed for mild constipation or moderate constipation.  . rosuvastatin (CRESTOR) 10 MG tablet Take 1 tablet (10 mg total) by mouth daily. TAKE 1 TABLET BY MOUTH ONCE DAILY IN THE EVENING  . sodium zirconium cyclosilicate (LOKELMA) 10 g PACK packet Take 10 g by mouth daily.  . [DISCONTINUED] levothyroxine (SYNTHROID) 75 MCG tablet TAKE 1 TABLET BY MOUTH ONCE DAILY BEFORE BREAKFAST   No facility-administered encounter medications on file as of 10/23/2019.   ALLERGIES: Allergies  Allergen Reactions  . Xanax [Alprazolam] Other (See Comments)    Pt feels very weak, tired and feels paralyzed     VACCINATION STATUS: Immunization History  Administered Date(s) Administered  . Fluad Quad(high Dose 65+) 07/28/2019  . Influenza,inj,Quad PF,6+ Mos 08/01/2016, 07/11/2017  . Pneumococcal Polysaccharide-23 07/02/2015    Diabetes He presents for his follow-up diabetic visit. He has type 2 diabetes mellitus. Onset time: He was diagnosed at approximate age of 36 years. His disease course has been stable. There are no hypoglycemic associated symptoms. Pertinent negatives for hypoglycemia include no confusion, headaches, pallor or seizures. There are no diabetic associated symptoms. Pertinent negatives for diabetes include no chest pain, no fatigue, no polydipsia, no polyphagia, no polyuria and no weakness. There are no  hypoglycemic complications. Symptoms are stable. Diabetic complications include heart disease. Risk factors for coronary artery disease include diabetes mellitus, dyslipidemia, hypertension, male sex and sedentary lifestyle. He is compliant with  treatment most of the time. His weight is increasing steadily. He is following a diabetic diet. He participates in exercise intermittently. An ACE inhibitor/angiotensin II receptor blocker is being taken.  Hyperlipidemia This is a chronic problem. The current episode started more than 1 year ago. Exacerbating diseases include diabetes. Pertinent negatives include no chest pain, myalgias or shortness of breath. Current antihyperlipidemic treatment includes statins. Risk factors for coronary artery disease include diabetes mellitus, dyslipidemia, hypertension and male sex.  Hypertension This is a chronic problem. The current episode started more than 1 year ago. Pertinent negatives include no chest pain, headaches, neck pain, palpitations or shortness of breath. Risk factors for coronary artery disease include diabetes mellitus and dyslipidemia. Hypertensive end-organ damage includes CAD/MI. Identifiable causes of hypertension include a thyroid problem.  Thyroid Problem Presents for follow-up visit. Patient reports no constipation, diarrhea, fatigue or palpitations. (Remains on levothyroxine 75 mcg p.o. nightly.  He is compliant.  He has no new complaints today.) The symptoms have been stable. His past medical history is significant for diabetes and hyperlipidemia.   Review of systems: Limited as above.  Objective:    There were no vitals taken for this visit.  Wt Readings from Last 3 Encounters:  10/17/19 202 lb (91.6 kg)  07/28/19 199 lb (90.3 kg)  07/01/19 198 lb (89.8 kg)     Chemistry (most recent): Lab Results  Component Value Date   NA 138 10/17/2019   K 4.5 10/17/2019   CL 105 10/17/2019   CO2 22 10/17/2019   BUN 28 (H) 10/17/2019    CREATININE 1.44 (H) 10/17/2019    Recent Results (from the past 2160 hour(s))  CUP PACEART REMOTE DEVICE CHECK     Status: None   Collection Time: 07/29/19  5:24 AM  Result Value Ref Range   Date Time Interrogation Session [id number]    Pulse Generator Manufacturer MERM    Pulse Gen Model [id number] [place] MRI Quad CRT-D    Pulse Gen Serial Number [id number]    Clinic Name [facility] Pulse Generator Type Cardiac Resynch Therapy Defibulator    Implantable Pulse Generator Implant Date [id number]    Implantable Lead Manufacturer [facility]    Implantable Lead Model 4598 Attain Performa S    Implantable Lead Serial Number [id number]    Implantable Lead Implant Date [id number]    Implantable Lead Location Detail 1 UNKNOWN    Implantable Lead Location [id number]    Implantable Lead Manufacturer MERM    Implantable Lead Model 5076 CapSureFix Novus MRI SureScan    Implantable Lead Serial Number [id number]    Implantable Lead Implant Date [id number]    Implantable Lead Location Detail 1 APPENDAGE    Implantable Lead Location [id number]    Implantable Lead Manufacturer [facility]    Implantable Lead Model [id number] Sprint Quattro Secure S MRI SureScan    Implantable Lead Serial Number [id number]    Implantable Lead Implant Date [id number]    Implantable Lead Location Detail 1 APEX    Implantable Lead Location [id number]    Lead Channel Setting Sensing Sensitivity 0.3 mV   Lead Channel Setting Pacing Amplitude 2 V   Lead Channel Setting Pacing Pulse Width 0.4 ms   Lead Channel Setting Pacing Amplitude 2 V   Lead Channel Setting Pacing Pulse Width 1 ms   Lead Channel Setting Pacing Amplitude 1.5 V   Lead Channel Setting Pacing Capture Mode Adaptive Capture    Lead Channel Impedance Value 456 ohm   Lead Channel  Sensing Intrinsic Amplitude 1.75 mV   Lead Channel Sensing Intrinsic Amplitude 1.75 mV   Lead Channel Pacing Threshold Amplitude 1 V   Lead Channel Pacing Threshold Pulse Width 0.4 ms   Lead  Channel Impedance Value 456 ohm   Lead Channel Impedance Value 342 ohm   Lead Channel Sensing Intrinsic Amplitude 17 mV   Lead Channel Sensing Intrinsic Amplitude 17 mV   Lead Channel Pacing Threshold Amplitude 0.5 V   Lead Channel Pacing Threshold Pulse Width 0.4 ms   HighPow Impedance 60 ohm   Lead Channel Impedance Value 817 ohm   Lead Channel Impedance Value 874 ohm   Lead Channel Impedance Value 817 ohm   Lead Channel Impedance Value 589 ohm   Lead Channel Impedance Value 779 ohm   Lead Channel Impedance Value 817 ohm   Lead Channel Impedance Value 475 ohm   Lead Channel Impedance Value 475 ohm   Lead Channel Impedance Value 513 ohm   Lead Channel Impedance Value 418 ohm   Lead Channel Impedance Value 237.5 ohm   Lead Channel Impedance Value 246.635    Lead Channel Impedance Value 222.34 ohm   Lead Channel Impedance Value 222.34 ohm   Lead Channel Impedance Value 230.327    Lead Channel Pacing Threshold Amplitude 0.875 V   Lead Channel Pacing Threshold Pulse Width 1 ms   Battery Status OK    Battery Remaining Longevity 45 mo   Battery Voltage 2.95 V   Brady Statistic RA Percent Paced 64.75 %   Brady Statistic RV Percent Paced 92.94 %   Brady Statistic AP VP Percent 64.55 %   Brady Statistic AS VP Percent 33.18 %   Brady Statistic AP VS Percent 1.56 %   Brady Statistic AS VS Percent 0.71 %  Comprehensive Metabolic Panel (CMET)     Status: Abnormal   Collection Time: 10/17/19 12:25 PM  Result Value Ref Range   Sodium 138 135 - 145 mmol/L   Potassium 4.5 3.5 - 5.1 mmol/L   Chloride 105 98 - 111 mmol/L   CO2 22 22 - 32 mmol/L   Glucose, Bld 148 (H) 70 - 99 mg/dL   BUN 28 (H) 8 - 23 mg/dL   Creatinine, Ser 1.44 (H) 0.61 - 1.24 mg/dL   Calcium 9.3 8.9 - 10.3 mg/dL   Total Protein 7.2 6.5 - 8.1 g/dL   Albumin 3.9 3.5 - 5.0 g/dL   AST 13 (L) 15 - 41 U/L   ALT 15 0 - 44 U/L   Alkaline Phosphatase 61 38 - 126 U/L   Total Bilirubin 1.2 0.3 - 1.2 mg/dL   GFR calc non Af  Amer 49 (L) >60 mL/min   GFR calc Af Amer 56 (L) >60 mL/min   Anion gap 11 5 - 15    Comment: Performed at [facility], [address] [place], [place] [address]  T4, free     Status: Abnormal   Collection Time: 10/17/19 12:25 PM  Result Value Ref Range   Free T4 1.32 (H) 0.61 - 1.12 ng/dL    Comment: (NOTE) Biotin ingestion may interfere with free T4 tests. If the results are inconsistent with the TSH level, previous test results, or the clinical presentation, then consider biotin interference. If needed, order repeat testing after stopping biotin. Performed at [facility], [place] [address] [place], [place] [address]   HgB A1c     Status: Abnormal   Collection Time: 10/17/19 12:25 PM  Result  Value Ref Range   Hgb A1c MFr Bld 7.2 (H) 4.8 - 5.6 %    Comment: (NOTE) Pre diabetes:          5.7%-6.4% Diabetes:              >6.4% Glycemic control for   <7.0% adults with diabetes    Mean Plasma Glucose 159.94 mg/dL    Comment: Performed at [facility] [address] [place] [address]  CBC     Status: None   Collection Time: 10/17/19 12:25 PM  Result Value Ref Range   WBC 9.3 4.0 - 10.5 K/uL   RBC 5.01 4.22 - 5.81 MIL/uL   Hemoglobin 15.2 13.0 - 17.0 g/dL   HCT 46.7 39.0 - 52.0 %   MCV 93.2 80.0 - 100.0 fL   MCH 30.3 26.0 - 34.0 pg   MCHC 32.5 30.0 - 36.0 g/dL   RDW 12.6 11.5 - 15.5 %   Platelets 177 150 - 400 K/uL   nRBC 0.0 0.0 - 0.2 %    Comment: Performed at [facility], [place] [address] [place], [place] [address]  B Nat Peptide     Status: Abnormal   Collection Time: 10/17/19 12:25 PM  Result Value Ref Range   B Natriuretic Peptide 4,254.3 (H) 0.0 - 100.0 pg/mL    Comment: Performed at [facility] [address], [place], [place] [address]  Lipid panel     Status: Abnormal   Collection Time: 10/17/19 12:25 PM  Result Value Ref Range   Cholesterol 122 0 - 200 mg/dL   Triglycerides 78 <150 mg/dL   HDL 37 (L) >40 mg/dL    Total CHOL/HDL Ratio 3.3 RATIO   VLDL 16 0 - 40 mg/dL   LDL Cholesterol 69 0 - 99 mg/dL    Comment:        Total Cholesterol/HDL:CHD Risk Coronary Heart Disease Risk Table                     Men   Women  1/2 Average Risk   3.4   3.3  Average Risk       5.0   4.4  2 X Average Risk   9.6   7.1  3 X Average Risk  23.4   11.0        Use the calculated Patient Ratio above and the CHD Risk Table to determine the patient's CHD Risk.        ATP III CLASSIFICATION (LDL):  <100     mg/dL   Optimal  100-129  mg/dL   Near or Above                    Optimal  130-159  mg/dL   Borderline  160-189  mg/dL   High  >190     mg/dL   Very High Performed at [facility] [address] [place], [place] [address]   TSH     Status: None   Collection Time: 10/17/19 12:25 PM  Result Value Ref Range   TSH 4.227 0.350 - 4.500 uIU/mL    Comment: Performed by a 3rd Generation assay with a functional sensitivity of <=0.01 uIU/mL. Performed at [facility], [place] [address], [place], [place] [address]     Assessment & Plan:   1. Type 2 diabetes mellitus with other circulatory complication ([place]), stage 3 CKD   His diabetes is  complicated by coronary artery disease status post coronary artery bypass graft and recent ACS and patient remains at a high risk for more acute and chronic complications of diabetes which include CAD, CVA, CKD, retinopathy, and neuropathy. These are all discussed in detail with the patient.  His A1c is slowly increasing to 7.2%. He continues to have stage 2-3 renal insufficiency. - I have re-counseled the patient on diet management  by adopting a carbohydrate restricted / protein rich  Diet.  -  Suggestion is made for him to avoid simple carbohydrates  from his diet including Cakes, Sweet Desserts / Pastries, Ice Cream, Soda (diet and regular), Sweet Tea, Candies, Chips, Cookies, Store Bought Juices, Alcohol in Excess of  1-2 drinks a day, Artificial Sweeteners, and  "Sugar-free" Products. This will help patient to have stable blood glucose profile and potentially avoid unintended weight gain.   - Patient is advised to stick to a routine mealtimes to eat 3 meals  a day and avoid unnecessary snacks ( to snack only to correct hypoglycemia).   - I have approached patient with the following individualized plan to manage diabetes and patient agrees.  -He will benefit from reinitiation of Tradjenta.  I discussed and initiated Tradjenta 5 mg p.o. daily at breakfast.    - He will not need insulin treatment at this time.  - Patient specific target  for A1c; LDL, HDL, Triglycerides, and  Waist Circumference were discussed in detail.  2) BP/HTN he is advised to home monitor blood pressure and report if > 140/90 on 2 separate readings.    Reportedly he runs low blood pressure at home and would not tolerate any additional therapy. I advised him to continue his current medications including beta blocker, spironolactone and,  Lasix as needed.   3) Lipids/HPL: His recent lipid panel showed controlled with  LDL at 63 in July 2018, he is advised to continue Crestor 10 mg p.o. nightly.  [name] 4)  Weight/Diet: CDE consult in progress, exercise, and carbohydrates information provided.  5) hypothyroidism: - This is related to his therapy with amiodarone. His previsit thyroid function tests are consistent with slight over replacement with levothyroxine. -I discussed and lowered levothyroxine to 50 mcg p.o. daily before breakfast.     - We discussed about the correct intake of his thyroid hormone, on empty stomach at fasting, with water, separated by at least 30 minutes from breakfast and other medications,  and separated by more than 4 hours from calcium, iron, multivitamins, acid reflux medications (PPIs). -Patient is made aware of the fact that thyroid hormone replacement is needed for life, dose to be adjusted by periodic monitoring of thyroid function tests. He was  assisted by his wife Ms. Keimig during this visit.  6) Chronic Care/Health Maintenance:  -Patient is  on ACEI/ARB and Statin medications and encouraged to continue to follow up with Ophthalmology, Podiatrist at least yearly or according to recommendations, and advised to  stay away from smoking. I have recommended yearly flu vaccine and pneumonia vaccination at least every 5 years, and  sleep for at least 7 hours a day.  I advised patient to maintain close follow up with his cardiologist and his  PCP for primary care needs.     - Time spent on this patient care encounter:  25 minutes of which 50% was spent in  counseling and the rest reviewing  his current and  previous labs / studies and medications  doses and developing a plan for long term  care. Christian Bowman  participated in the discussions, expressed understanding, and voiced agreement with the above plans.  All questions were answered to his satisfaction. he is encouraged to contact clinic should he have any questions or concerns prior to his return visit.     Follow up plan: Return in about 3 months (around 01/21/2020) for Follow up with Pre-visit Labs, Next Visit A1c in Office.  Glade Lloyd, MD Phone: [phone]  Fax: [phone]   This note was partially dictated with voice recognition software. Similar sounding words can be transcribed inadequately or may not  be corrected upon review.  10/23/2019, 12:14 PM

## 2019-10-24 ENCOUNTER — Other Ambulatory Visit (HOSPITAL_COMMUNITY): Payer: Self-pay | Admitting: Cardiology

## 2019-10-24 ENCOUNTER — Other Ambulatory Visit: Payer: Self-pay | Admitting: *Deleted

## 2019-10-24 DIAGNOSIS — I5042 Chronic combined systolic (congestive) and diastolic (congestive) heart failure: Secondary | ICD-10-CM

## 2019-10-24 DIAGNOSIS — I639 Cerebral infarction, unspecified: Secondary | ICD-10-CM

## 2019-10-27 ENCOUNTER — Ambulatory Visit: Payer: Medicare Other | Admitting: Cardiovascular Disease

## 2019-10-27 ENCOUNTER — Telehealth: Payer: Self-pay | Admitting: Cardiovascular Disease

## 2019-10-27 NOTE — Telephone Encounter (Signed)
Left a message for the patient to call back.  Dr. Sallyanne Kuster, the patient should have an echo due to a recent stroke. Will request that the echo results be faxed to the office.

## 2019-10-27 NOTE — Telephone Encounter (Signed)
Called to schedule Echo ordered by Dr. Sallyanne Kuster.  Patient's wife states Christian Bowman had a light stroke a couple of weeks ago and had an Echo in [place], [place] hospital.  Does patient need another Echo scheduled?

## 2019-10-28 ENCOUNTER — Ambulatory Visit (INDEPENDENT_AMBULATORY_CARE_PROVIDER_SITE_OTHER): Payer: Medicare Other | Admitting: *Deleted

## 2019-10-28 DIAGNOSIS — I5023 Acute on chronic systolic (congestive) heart failure: Secondary | ICD-10-CM

## 2019-10-29 ENCOUNTER — Other Ambulatory Visit: Payer: Self-pay

## 2019-10-29 ENCOUNTER — Ambulatory Visit (HOSPITAL_COMMUNITY)
Admission: RE | Admit: 2019-10-29 | Discharge: 2019-10-29 | Disposition: A | Discharge status: Home / Self Care | Payer: Medicare Other | Source: Ambulatory Visit | Attending: Internal Medicine | Admitting: Internal Medicine

## 2019-10-29 DIAGNOSIS — I5022 Chronic systolic (congestive) heart failure: Secondary | ICD-10-CM | POA: Insufficient documentation

## 2019-10-29 LAB — CUP PACEART REMOTE DEVICE CHECK
Battery Remaining Longevity: 40 mo
Battery Voltage: 2.96 V
Brady Statistic AP VP Percent: 64.5 %
Brady Statistic AP VS Percent: 1.13 %
Brady Statistic AS VP Percent: 33.11 %
Brady Statistic AS VS Percent: 1.26 %
Brady Statistic RA Percent Paced: 61.57 %
Brady Statistic RV Percent Paced: 91.7 %
Date Time Interrogation Session: 20210126122111
HighPow Impedance: 60 Ohm
Implantable Lead Implant Date: 20171024
Implantable Lead Implant Date: 20171024
Implantable Lead Implant Date: 20171024
Implantable Lead Location: 753858
Implantable Lead Location: 753859
Implantable Lead Location: 753860
Implantable Lead Model: 4598
Implantable Lead Model: 5076
Implantable Pulse Generator Implant Date: 20171024
Lead Channel Impedance Value: 218.087
Lead Channel Impedance Value: 218.087
Lead Channel Impedance Value: 222.34 Ohm
Lead Channel Impedance Value: 228 Ohm
Lead Channel Impedance Value: 232.653
Lead Channel Impedance Value: 304 Ohm
Lead Channel Impedance Value: 399 Ohm
Lead Channel Impedance Value: 418 Ohm
Lead Channel Impedance Value: 418 Ohm
Lead Channel Impedance Value: 456 Ohm
Lead Channel Impedance Value: 456 Ohm
Lead Channel Impedance Value: 475 Ohm
Lead Channel Impedance Value: 551 Ohm
Lead Channel Impedance Value: 760 Ohm
Lead Channel Impedance Value: 760 Ohm
Lead Channel Impedance Value: 760 Ohm
Lead Channel Impedance Value: 760 Ohm
Lead Channel Impedance Value: 779 Ohm
Lead Channel Pacing Threshold Amplitude: 0.625 V
Lead Channel Pacing Threshold Amplitude: 0.75 V
Lead Channel Pacing Threshold Amplitude: 1 V
Lead Channel Pacing Threshold Pulse Width: 0.4 ms
Lead Channel Pacing Threshold Pulse Width: 0.4 ms
Lead Channel Pacing Threshold Pulse Width: 1 ms
Lead Channel Sensing Intrinsic Amplitude: 1.5 mV
Lead Channel Sensing Intrinsic Amplitude: 1.5 mV
Lead Channel Sensing Intrinsic Amplitude: 12.375 mV
Lead Channel Sensing Intrinsic Amplitude: 12.375 mV
Lead Channel Setting Pacing Amplitude: 1.25 V
Lead Channel Setting Pacing Amplitude: 2 V
Lead Channel Setting Pacing Amplitude: 2 V
Lead Channel Setting Pacing Pulse Width: 0.4 ms
Lead Channel Setting Pacing Pulse Width: 1 ms
Lead Channel Setting Sensing Sensitivity: 0.3 mV

## 2019-10-29 LAB — BASIC METABOLIC PANEL
Anion gap: 8 (ref 5–15)
BUN: 29 mg/dL — ABNORMAL HIGH (ref 8–23)
CO2: 27 mmol/L (ref 22–32)
Calcium: 9.2 mg/dL (ref 8.9–10.3)
Chloride: 105 mmol/L (ref 98–111)
Creatinine, Ser: 1.58 mg/dL — ABNORMAL HIGH (ref 0.61–1.24)
GFR calc Af Amer: 50 mL/min — ABNORMAL LOW (ref >60)
GFR calc non Af Amer: 43 mL/min — ABNORMAL LOW (ref >60)
Glucose, Bld: 145 mg/dL — ABNORMAL HIGH (ref 70–99)
Potassium: 4.5 mmol/L (ref 3.5–5.1)
Sodium: 140 mmol/L (ref 135–145)

## 2019-10-31 ENCOUNTER — Telehealth (HOSPITAL_COMMUNITY): Payer: Self-pay

## 2019-10-31 NOTE — Telephone Encounter (Signed)
LATE ENTRY 10/17/19  Send fax to [facility] to receive recent echo, ct of head, and carotid study per Dr. Aundra Dubin. Fax confirmation received 10/17/19.

## 2019-11-03 NOTE — Telephone Encounter (Signed)
Dr. Claris Gladden office has requested copies of the echo and other pertinent test results.

## 2019-11-04 ENCOUNTER — Other Ambulatory Visit: Payer: Self-pay

## 2019-11-05 ENCOUNTER — Telehealth (HOSPITAL_COMMUNITY): Payer: Self-pay | Admitting: Radiology

## 2019-11-05 NOTE — Telephone Encounter (Signed)
Duplicate encounter. Waiting on echo results.

## 2019-11-05 NOTE — Telephone Encounter (Signed)
During call to schedule echocardiogram, spoke with patient's wife. Patient had an echocardiogram in [place] in January 2021. Can the results of this test be used instead of scheduling another test? Patient does not want another echocardiogram at this time

## 2019-11-24 ENCOUNTER — Telehealth: Payer: Self-pay

## 2019-11-24 ENCOUNTER — Ambulatory Visit (HOSPITAL_COMMUNITY)
Admission: RE | Admit: 2019-11-24 | Discharge: 2019-11-24 | Disposition: A | Discharge status: Home / Self Care | Payer: Medicare Other | Source: Ambulatory Visit | Attending: Cardiology | Admitting: Cardiology

## 2019-11-24 ENCOUNTER — Other Ambulatory Visit: Payer: Self-pay

## 2019-11-24 ENCOUNTER — Encounter (HOSPITAL_COMMUNITY): Payer: Self-pay | Admitting: Cardiology

## 2019-11-24 VITALS — BP 130/70 | HR 77 | Wt 201.8 lb

## 2019-11-24 DIAGNOSIS — I25810 Atherosclerosis of coronary artery bypass graft(s) without angina pectoris: Secondary | ICD-10-CM | POA: Insufficient documentation

## 2019-11-24 DIAGNOSIS — Z7984 Long term (current) use of oral hypoglycemic drugs: Secondary | ICD-10-CM | POA: Insufficient documentation

## 2019-11-24 DIAGNOSIS — Z7902 Long term (current) use of antithrombotics/antiplatelets: Secondary | ICD-10-CM | POA: Diagnosis not present

## 2019-11-24 DIAGNOSIS — E1122 Type 2 diabetes mellitus with diabetic chronic kidney disease: Secondary | ICD-10-CM | POA: Insufficient documentation

## 2019-11-24 DIAGNOSIS — Z79899 Other long term (current) drug therapy: Secondary | ICD-10-CM | POA: Diagnosis not present

## 2019-11-24 DIAGNOSIS — Z8673 Personal history of transient ischemic attack (TIA), and cerebral infarction without residual deficits: Secondary | ICD-10-CM | POA: Insufficient documentation

## 2019-11-24 DIAGNOSIS — E785 Hyperlipidemia, unspecified: Secondary | ICD-10-CM | POA: Diagnosis not present

## 2019-11-24 DIAGNOSIS — Z951 Presence of aortocoronary bypass graft: Secondary | ICD-10-CM | POA: Diagnosis not present

## 2019-11-24 DIAGNOSIS — I2510 Atherosclerotic heart disease of native coronary artery without angina pectoris: Secondary | ICD-10-CM | POA: Insufficient documentation

## 2019-11-24 DIAGNOSIS — I4892 Unspecified atrial flutter: Secondary | ICD-10-CM | POA: Diagnosis not present

## 2019-11-24 DIAGNOSIS — I340 Nonrheumatic mitral (valve) insufficiency: Secondary | ICD-10-CM | POA: Insufficient documentation

## 2019-11-24 DIAGNOSIS — R0602 Shortness of breath: Secondary | ICD-10-CM | POA: Diagnosis not present

## 2019-11-24 DIAGNOSIS — E875 Hyperkalemia: Secondary | ICD-10-CM | POA: Insufficient documentation

## 2019-11-24 DIAGNOSIS — I5022 Chronic systolic (congestive) heart failure: Secondary | ICD-10-CM | POA: Insufficient documentation

## 2019-11-24 DIAGNOSIS — Z8774 Personal history of (corrected) congenital malformations of heart and circulatory system: Secondary | ICD-10-CM | POA: Insufficient documentation

## 2019-11-24 DIAGNOSIS — I255 Ischemic cardiomyopathy: Secondary | ICD-10-CM | POA: Diagnosis not present

## 2019-11-24 DIAGNOSIS — Z7982 Long term (current) use of aspirin: Secondary | ICD-10-CM | POA: Insufficient documentation

## 2019-11-24 DIAGNOSIS — I252 Old myocardial infarction: Secondary | ICD-10-CM | POA: Diagnosis not present

## 2019-11-24 DIAGNOSIS — Z953 Presence of xenogenic heart valve: Secondary | ICD-10-CM | POA: Insufficient documentation

## 2019-11-24 DIAGNOSIS — Z8249 Family history of ischemic heart disease and other diseases of the circulatory system: Secondary | ICD-10-CM | POA: Insufficient documentation

## 2019-11-24 DIAGNOSIS — E039 Hypothyroidism, unspecified: Secondary | ICD-10-CM | POA: Diagnosis not present

## 2019-11-24 DIAGNOSIS — I442 Atrioventricular block, complete: Secondary | ICD-10-CM | POA: Insufficient documentation

## 2019-11-24 DIAGNOSIS — N183 Chronic kidney disease, stage 3 unspecified: Secondary | ICD-10-CM | POA: Insufficient documentation

## 2019-11-24 LAB — BASIC METABOLIC PANEL
Anion gap: 11 (ref 5–15)
BUN: 28 mg/dL — ABNORMAL HIGH (ref 8–23)
CO2: 23 mmol/L (ref 22–32)
Calcium: 9.1 mg/dL (ref 8.9–10.3)
Chloride: 104 mmol/L (ref 98–111)
Creatinine, Ser: 1.66 mg/dL — ABNORMAL HIGH (ref 0.61–1.24)
GFR calc Af Amer: 47 mL/min — ABNORMAL LOW (ref >60)
GFR calc non Af Amer: 41 mL/min — ABNORMAL LOW (ref >60)
Glucose, Bld: 151 mg/dL — ABNORMAL HIGH (ref 70–99)
Potassium: 4.5 mmol/L (ref 3.5–5.1)
Sodium: 138 mmol/L (ref 135–145)

## 2019-11-24 MED ORDER — DAPAGLIFLOZIN PROPANEDIOL 10 MG PO TABS: 10.0000 mg | ORAL_TABLET | Freq: Every day | ORAL | 5 refills | Status: DC

## 2019-11-24 NOTE — Telephone Encounter (Signed)
Sent information to release hospital records to [facility] for [facility]

## 2019-11-24 NOTE — Progress Notes (Signed)
Date:  11/24/2019   ID:  Christian Bowman, DOB 08-11-1946, MRN [id number]   Provider location: [facility] Type of Visit: Established patient   PCP:  Sharilyn Sites, MD  Cardiologist: Dr. Sallyanne Kuster  HF Cardiologist:  Loralie Champagne, MD   History of Present Illness: Christian Bowman is a 74 y.o. male with history of CAD s/p CABG in 2007 then redo CABG in 10/17 with mitral valve replacement.  He was admitted in 10/17 for redo CABG with SVG-PDA and SVG-ramus.  He also had mitral valve replacement with a bioprosthetic valve because of infarct-related mitral regurgitation.  Post-operative course was complicated by CHF requiring diuresis.  He also had atrial flutter and required DCCV.  Due to complete heart block, he later got a CRT-D system.    At a prior visit, he was in atrial flutter.  He saw Dr. Curt Bears, it was decided to arrange for DCCV with ablation down the road when PPM leads have been in longer.  He had successful DCCV in 12/17 and is in NSR today.   He was admitted in 3/18 with NSTEMI and chest pain. TnI only 0.5.  LHC showed occlusion of SVG-PDA from CABG#1 to be the likely culprit.  However, SVG-PDA from CABG#2 was patent.  No intervention.  Echo in 3/18 from [place] showed EF 30-35%, stable bioprosthetic mitral valve.   He had atrial flutter ablation in 8/18.  He is in NSR today and is off amiodarone.    In 11/18, he had an inguinal hernia repair. Post-op, he had an NSTEMI.  LHC showed occlusion of a PLV branch that had been backfilled by SVG-PDA (prior cath had shown severe diffuse disease in the PLV).  Echo in 11/18 showed EF 20-25% with normal bioprosthetic mitral valve.    With no recurrence of atrial arrhythmias, he is now off anticoagulation.   Echo in 7/20 showed EF 25-30%, bioprosthetic MV with no Christian and mean gradient 5 mmHg, normal RV.    In 1/21, he developed right hand weakness and dysarthria concerning for CVA.  He went to the ER in [place] and  had tPA.  Symptoms resolved.  Atrial fibrillation was not noted per his wife's report.  CTA head/neck showed no significant carotid stenosis.  He was started on Plavix 75 mg daily and sent home.    He returns for followup of CHF. Weight is down 1 lb. He is short of breath with moderate exertion.  Endurance is down a lot on his home treadmill, hard to tell if this is from CHF or deconditioning (has been inactive recently). No residual neurological symptoms. He has taken Lasix x 2 since last appointment.   Medtronic device interrogation: 92% BiV paced, stable thoracic impedance, no AF/VT.      Labs (11/17): K 4.8, creatinine 1.89, hgb 12.3, digoxin 0.9, LFTs normal, TSH 8.235 (mild increase), free T3 low, free T4 normal, LFTs normal.  Labs (12/17): K 4.5, creatinine 1.7, BNP 3909 Labs (3/18): K 3.9, creatinine 1.48, hgb 11.3 Labs (4/18): digoxin 0.5, LFTs normal Labs (7/18): K 5.1, creatinine 1.78, LDL 63, HDL 29, TSH elevated Labs (11/18): K 4.5, creatinine 1.86, hgb 13.6 Labs (12/18): K 5, creatinine 1.88 Labs (1/19): digoxin 0.3, K 4.7, creatinine 1.83 Labs (6/19): LDL 70, HDL 32, K 5.1, creatinine 1.88 Labs (12/19): LDL 60, HDL 31 Labs (1/20): K 5.1, creatinine 1.64 Labs (4/20): K 4.5, creatinine 1.77 Labs (8/20): K 4.6, creatinine 1.5 Labs (1/21): K  4.5, creatinine 1.58, LDL 69  PMH: 1. CAD: CABG 2007.  - LHC (9/17) with patent LIMA-LAD, totally occluded SVG-D, severe stenosis in SVG-PDA.  - Redo CABG 10/17 with SVG-PDA, SVG-ramus and mitral valve replacement.  - NSTEMI 3/18.  LHC with culprit lesion likely occlusion of SVG-PDA from CABG#1.  Patent SVG-PDA from CABG#2.  No intervention.  - NSTEMI 11/18 post-op inguinal hernia repair.  LHC with occlusion of a PLV branch that had been backfilled by SVG-PDA (prior cath had shown severe diffuse disease in the PLV).  2. Mitral regurgitation: Ischemic Christian, mitral valve replacement was done in 10/17 (bioprosthetic). 3. Complete heart block:  Post-op in 10/17.  Medtronic CRT-D placed.  4. Atrial flutter: DCCV 10/17 and again in 12/17.  - Ablation 8/18, now off amiodarone.  5. Type II diabetes 6. Hyperlipidemia 7. Chronic systolic CHF: Ischemic cardiomyopathy.   - TEE (10/17): EF 20-25%, severe LV dilation - Echo (3/18, [place]): EF 30-35%, bioprosthetic mitral valve with mean gradient 5 mmHg.  - Echo (11/18): EF 20-25%, moderate LV dilation, normal bioprosthetic mitral valve.  - Echo (7/20): EF 25-30%, moderate LV dilation, inferior/inferolateral AK, bioprosthetic MV with mean gradient 5 and no Christian, normal RV.  - Painful gynecomastia with spironolactone.  - Echo (1/21): EF < 20%, normal RV size and systolic function, bioprosthetic mitral valve with mild Christian, mean gradient 2 mmHg.  8. Hypothyroidism 9. CVA 1/21: CTA head/neck with no carotid stenosis.   Current Outpatient Medications  Medication Sig Dispense Refill  . amoxicillin (AMOXIL) 500 MG tablet Take 4 tablets (2,000 mg total) by mouth as needed. Pre-dental work only. 4 tablet 3  . aspirin EC 81 MG tablet Take 81 mg by mouth daily.     . carvedilol (COREG) 12.5 MG tablet Take 1.5 tablets (18.75 mg total) by mouth 2 (two) times daily. 180 tablet 3  . cholecalciferol (VITAMIN D3) 25 MCG (1000 UNIT) tablet Take 1,000 Units by mouth daily as needed.     . clopidogrel (PLAVIX) 75 MG tablet Take 75 mg by mouth daily.    [name] ENTRESTO 49-51 MG Take 1 tablet by mouth twice daily 180 tablet 0  . eplerenone (INSPRA) 25 MG tablet Take 1 tablet (25 mg total) by mouth daily. 30 tablet 6  . furosemide (LASIX) 20 MG tablet Take 1 tablet (20 mg total) by mouth daily as needed. 15 tablet 6  . isosorbide mononitrate (IMDUR) 30 MG 24 hr tablet Take 1 tablet (30 mg total) by mouth as needed. 45 tablet 2  . levothyroxine (SYNTHROID) 50 MCG tablet Take 1 tablet (50 mcg total) by mouth daily before breakfast. 30 tablet 3  . linagliptin (TRADJENTA) 5 MG TABS tablet Take 1 tablet (5 mg total) by  mouth daily. 90 tablet 1  . Melatonin 1 MG CAPS Take 1 mg by mouth daily as needed (sleep).     . neomycin-bacitracin-polymyxin (NEOSPORIN) ointment Apply 1 application topically daily as needed for wound care.    . nitroGLYCERIN (NITROSTAT) 0.4 MG SL tablet DISSOLVE ONE TABLET UNDER THE TONGUE EVERY 5 MINUTES AS NEEDED FOR CHEST PAIN.  DO NOT EXCEED A TOTAL OF 3 DOSES IN 15 MINUTES 25 tablet 6  . polyethylene glycol (MIRALAX / GLYCOLAX) packet Take 17 g by mouth daily as needed for mild constipation or moderate constipation.    . rosuvastatin (CRESTOR) 10 MG tablet Take 1 tablet (10 mg total) by mouth daily. TAKE 1 TABLET BY MOUTH ONCE DAILY IN THE EVENING 90 tablet 1  .  sodium zirconium cyclosilicate (LOKELMA) 10 g PACK packet Take 10 g by mouth daily. 90 each 0  . dapagliflozin propanediol (FARXIGA) 10 MG TABS tablet Take 10 mg by mouth daily before breakfast. 30 tablet 5   No current facility-administered medications for this encounter.    Allergies:   Xanax [alprazolam]   Social History:  The patient  reports that he has never smoked. He has never used smokeless tobacco. He reports that he does not drink alcohol or use drugs.   Family History:  The patient's family history includes Heart attack in his father and mother; Heart disease in his father, maternal grandmother, and mother; Hypertension in his father and sister.   ROS:  Please see the history of present illness.   All other systems are personally reviewed and negative.   Exam:   BP 130/70   Pulse 77   Wt 91.5 kg (201 lb 12.8 oz)   SpO2 98%   BMI 27.37 kg/m   General: NAD Neck: No JVD, no thyromegaly or thyroid nodule.  Lungs: Clear to auscultation bilaterally with normal respiratory effort. CV: Nondisplaced PMI.  Heart regular S1/S2, no S3/S4, no murmur.  No peripheral edema.  No carotid bruit.  Normal pedal pulses.  Abdomen: Soft, nontender, no hepatosplenomegaly, no distention.  Skin: Intact without lesions or rashes.    Neurologic: Alert and oriented x 3.  Psych: Normal affect. Extremities: No clubbing or cyanosis.  HEENT: Normal.   Recent Labs: 10/17/2019: ALT 15; B Natriuretic Peptide 4,254.3; Hemoglobin 15.2; Platelets 177; TSH 4.227 11/24/2019: BUN 28; Creatinine, Ser 1.66; Potassium 4.5; Sodium 138  Personally reviewed   Wt Readings from Last 3 Encounters:  11/24/19 91.5 kg (201 lb 12.8 oz)  10/17/19 91.6 kg (202 lb)  07/28/19 90.3 kg (199 lb)      ASSESSMENT AND PLAN:  1. Chronic systolic CHF: Ischemic cardiomyopathy.  TEE 10/17 with EF 20-25%.  Echo in 1/21 in [place] showed EF <20%, normal RV function, normal bioprosthetic mitral valve.  He is not volume overloaded on exam or by Optivol.  Increased fatigue and dyspnea recently, NYHA class III.  - Continue to use Lasix prn.    - Continue Entresto 49/51 bid.  Would not increase with history of orthostatic-type symptoms.  BMET today.  - Continue eplerenone 25 mg daily.  Gynecomastia has resolved off spironolactone.  - Continue Coreg 18.75 mg bid.    - He has been on Lokelma chronically to control hyperkalemia and allow use of spironolactone and Entresto.  - Add dapagliflozin 10 mg daily.   - BMET today, BMET in 10 days.  - I will arrange for CPX for objective measurement of functional capacity. Christian Bowman would likely be an LVAD candidate if symptoms worsen.  2. CAD: s/p redo CABG. Admission in 3/18 with NSTEMI, LHC showed occlusion of SVG-PDA from CABG#1 but patent SVG-PDA from CABG#2, no intervention.  He had a post-op NSTEMI after inguinal hernia repair in 11/18.  LHC showed occlusion of a PLV branch that had been backfilled by SVG-PDA (prior cath had shown severe diffuse disease in the PLV). No recent chest pain.     - Continue statin => Crestor 10 mg daily. Good lipids in 1/21.  - Restart cardiac rehab maintenance when coronavirus restrictions are lifted.    - Continue ASA 81. - Avoid future elective surgery.  3. Bioprosthetic mitral  valve: Stable 1/21 echo with mild Christian and mean gradient 2 mmHg.  4. Atrial flutter: s/p ablation in 8/18.  He is in NSR and off amiodarone.  - Now off warfarin with flutter ablation and no recurrence. If he has recurrence of atrial arrhythmias, based on most recent data DOAC would be a reasonable choice for him even with bioprosthetic MV.  5. CKD: Stage III.  BMET.   6. Type II diabetes: Add dapagliflozin as above.   7. CVA: In 1/21, got tPA at the ER in [place].  Symptoms resolved.  CTA head/neck showed no carotid stenosis.  Device interrogation today showed no atrial fibrillation. If atrial fibrillation is noted on future monitoring, will need anticoagulation.  - Continue ASA and Plavix 75 daily.  - Continue Crestor.   Followup in 2 months.   Signed, Loralie Champagne, MD  11/24/2019  [facility] [address] [facility] and [place] [address] [phone] (office) [phone] (fax)

## 2019-11-24 NOTE — Patient Instructions (Addendum)
START Farxiga 10mg  (1 tab) daily   You have an appointment with neurology on February 24th, 2021 at 9:10am    Labs today and repeat in 2 weeks  We will only contact you if something comes back abnormal or we need to make some changes. Otherwise no news is good news!   Your physician has recommended that you have a cardiopulmonary stress test (CPX). CPX testing is a non-invasive measurement of heart and lung function. It replaces a traditional treadmill stress test. This type of test provides a tremendous amount of information that relates not only to your present condition but also for future outcomes. This test combines measurements of you ventilation, respiratory gas exchange in the lungs, electrocardiogram (EKG), blood pressure and physical response before, during, and following an exercise protocol.   Your physician recommends that you schedule a follow-up appointment in:  2 months with Dr Aundra Dubin.   Please call office at [phone] option 2 if you have any questions or concerns.   At the [facility], you and your health needs are our priority. As part of our continuing mission to provide you with exceptional heart care, we have created designated Provider Care Teams. These Care Teams include your primary Cardiologist (physician) and Advanced Practice Providers (APPs- Physician Assistants and Nurse Practitioners) who all work together to provide you with the care you need, when you need it.   You may see any of the following providers on your designated Care Team at your next follow up: [name] Dr Glori Bickers . Dr Loralie Champagne . Darrick Grinder, NP . Lyda Jester, PA . Audry Riles, PharmD   Please be sure to bring in all your medications bottles to every appointment.

## 2019-11-25 ENCOUNTER — Encounter (HOSPITAL_COMMUNITY): Payer: Self-pay

## 2019-11-25 NOTE — Progress Notes (Signed)
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  History supplemented by hospital records.  He was admitted to [facility] in [place] on 10/11/2019 for acute onset slurred speech and inability to move his right hand.  He was eating cereal when suddenly he couldn't make his hand function.  He was unable to let go of his spoon.  It lasted about 30 minutes but still had some slurred speech that persisted.    NIHSS was 4.  CT head revealed no acute intracranial abnormality.  CTA of head and neck revealed no significant large vessel occlusion or stenosis.  He was given IV tPA with resolution of symptoms.  EKG showed a paced rhythm and AICD interrogation showed no arrhythmia.  Echocardiogram showed EF <20% with grade 2 diastolic dysfunction, mild mitral valve regurgitation without leak of prosthesis, and no interatrial shunt.  Hgb A1c was 7.1.  LDL was 59.  Because he has an AICD, an MRI was not performed even though it is MRI compatible.  They recommended that it be perform as an outpatient.  He was on ASA 81mg  daily prior to hospitalization and was discharged on ASA and Plavix.  Right now, he is asymptomatic.  PAST MEDICAL HISTORY: Past Medical History:  Diagnosis Date  . AICD (automatic cardioverter/defibrillator) present    medtronic-   DR. CROITORU , DR. Aundra Dubin   . Anginal pain ([place])    cp sat 08/11/17  . Anxiety   . CAD (coronary artery disease)   . CHF (congestive heart failure) ([place])   . Complication of anesthesia    took awhile  to wake up   . Coronary artery disease involving coronary bypass graft   . Cyst of neck    right side  . DM2 (diabetes mellitus, type 2) ([place]) 08/26/2013  . Dyspnea   . Heart attack ([place])    "not sure when" (08/20/2017)  . HTN (hypertension) 08/26/2013  . Hyperlipidemia 08/26/2013  . Hypothyroidism   . Left main coronary artery disease   . Left renal artery stenosis ([place])    Genesis 6x12 stent 2007  . Obesity (BMI 30.0-34.9) 08/26/2013  . Postoperative atrial fibrillation ([place]) 10/15/2005  . Presence of permanent cardiac pacemaker   . S/P CABG x 4 10/13/2005   LIMA to LAD, SVG to intermediate branch, sequential SVG to PDA and RPL branch, EVH via right thigh  . S/P mitral valve replacement with bioprosthetic valve 07/11/2016   31 mm [facility] Mitral bovine bioprosthetic tissue valve  . S/P redo CABG x 2 07/11/2016   SVG to PDA and SVG to Intermediate Branch, EVH via left thigh    PAST SURGICAL HISTORY: Past Surgical History:  Procedure Laterality Date  . A-FLUTTER ABLATION N/A 05/11/2017   Procedure: A-Flutter Ablation;  Surgeon: Constance Haw, MD;  Location: [place] CV LAB;  Service: Cardiovascular;  Laterality: N/A;  . CARDIAC CATHETERIZATION N/A 06/21/2016   Procedure: Right/Left Heart Cath and Coronary/Graft Angiography;  Surgeon: Sherren Mocha, MD;  Location: [place] [facility];  Service: Cardiovascular;  Laterality: N/A;  . CARDIAC VALVE REPLACEMENT    . CARDIOVERSION N/A 07/19/2016   Procedure: CARDIOVERSION;  Surgeon: Lelon Perla, MD;  Location: [facility] ENDOSCOPY;  Service: Cardiovascular;  Laterality: N/A;  . CARDIOVERSION N/A 09/08/2016   Procedure: CARDIOVERSION;  Surgeon: Fay Records, MD;  Location: [place];  Service: Cardiovascular;  Laterality: N/A;  . CORONARY ANGIOPLASTY     STENT 2016  [place]     DES in SVG to right coronary artery system  . CORONARY ARTERY BYPASS GRAFT  10/13/2005    LIMA to LAD, SVG to intermediate branch, sequential SVG to PDA and RPL  . CORONARY ARTERY BYPASS GRAFT N/A 07/11/2016   Procedure: REDO CORONARY ARTERY BYPASS GRAFTING (CABG) x two using left leg greater saphenous vein harvested endoscopically-SVG to PDA -SVG to RAMUS INTERMEDIATE;  Surgeon: Rexene Alberts, MD;  Location: [place];  Service: Open Heart Surgery;  Laterality: N/A;  . CORONARY ARTERY BYPASS GRAFT N/A 07/11/2016   Procedure: Re-exploration (CABG) for post op bleeding,;  Surgeon: Rexene Alberts, MD;  Location: [place];  Service: Open Heart Surgery;  Laterality: N/A;  . EP IMPLANTABLE DEVICE N/A 07/25/2016   Procedure: BiV ICD Insertion CRT-D;  Surgeon: Will Meredith Leeds, MD;  Location: [place] [facility];  Service: Cardiovascular;  Laterality: N/A;  . INGUINAL HERNIA REPAIR Left 08/20/2017  . INGUINAL HERNIA REPAIR Left 08/20/2017   Procedure: OPEN LEFT INGUINAL HERNIA REPAIR;  Surgeon: Alphonsa Overall, MD;  Location: [place];  Service: General;  Laterality: Left;  . LEFT HEART CATH AND CORS/GRAFTS ANGIOGRAPHY N/A 12/18/2016   Procedure: Left Heart Cath and Cors/Grafts Angiography;  Surgeon: Sherren Mocha, MD;  Location: [place] [facility];  Service: Cardiovascular;  Laterality: N/A;  . LEFT HEART CATH AND CORS/GRAFTS ANGIOGRAPHY N/A 08/22/2017   Procedure: LEFT HEART CATH AND CORS/GRAFTS ANGIOGRAPHY;  Surgeon: Larey Dresser, MD;  Location: [place] [facility];  Service: Cardiovascular;  Laterality: N/A;  . MITRAL VALVE REPLACEMENT N/A 07/11/2016   Procedure: MITRAL VALVE (MV) REPLACEMENT;  Surgeon: Rexene Alberts, MD;  Location: [place];  Service: Open Heart Surgery;  Laterality: N/A;  . MYOCARDICAL PERFUSION  10/09/2007   NORMAL PERFUSION IN ALL REGIONS;NO EVIDENCE OF INDUCIBLE ISCHEMIA;POST STRESS EF% 66  . RENAL ARTERY STENT Right 2007  . RENAL DOPPLER  03/28/2010   RIGHT RA-NORMAL;LEFT PROXIMAL RA AT STENT-PATENT WITH NO EVIDENCE OF SIGN DIAMETER REDUCTION. R & L KIDNEYS: EQUAL IN  SIZE,SYMMETRICAL IN SHAPE.  . TEE WITHOUT CARDIOVERSION N/A 06/15/2016   Procedure: TRANSESOPHAGEAL ECHOCARDIOGRAM (TEE);  Surgeon: Sanda Klein, MD;  Location: [facility] ENDOSCOPY;  Service: Cardiovascular;  Laterality: N/A;  . TEE WITHOUT CARDIOVERSION N/A 07/11/2016   Procedure: TRANSESOPHAGEAL ECHOCARDIOGRAM (TEE);  Surgeon: Rexene Alberts, MD;  Location: [place];  Service: Open Heart Surgery;  Laterality: N/A;  . TEE WITHOUT CARDIOVERSION N/A 07/19/2016   Procedure: TRANSESOPHAGEAL ECHOCARDIOGRAM (TEE);  Surgeon: Lelon Perla, MD;  Location: [facility] ENDOSCOPY;  Service: Cardiovascular;  Laterality: N/A;  . TRANSESOPHAGEAL ECHOCARDIOGRAM  10/19/2005   NORMAL LV; MILD TO MODERATE AMOUNT OF SOFT ATHEROMATOUS PLAQUE OF THE THORACIC AORTA; THE LEFT ATRIUM IS MILDLY DILATED;LEFT ATRIAL APPENDAGE FUNCTION IS NORMAL;NO THROMBUS IDENTIFIED. SMALL PFO WITH RIGHT TO LEFT SHUNT    MEDICATIONS: Current Outpatient Medications on File Prior to Visit  Medication Sig Dispense Refill  . amoxicillin (AMOXIL) 500 MG tablet Take 4 tablets (2,000 mg total) by mouth as needed. Pre-dental work only.  4 tablet 3  . aspirin EC 81 MG tablet Take 81 mg by mouth daily.     . carvedilol (COREG) 12.5 MG tablet Take 1.5 tablets (18.75 mg total) by mouth 2 (two) times daily. 180 tablet 3  . cholecalciferol (VITAMIN D3) 25 MCG (1000 UNIT) tablet Take 1,000 Units by mouth daily as needed.     . clopidogrel (PLAVIX) 75 MG tablet Take 75 mg by mouth daily.    . dapagliflozin propanediol (FARXIGA) 10 MG TABS tablet Take 10 mg by mouth daily before breakfast. 30 tablet 5  . ENTRESTO 49-51 MG Take 1 tablet by mouth twice daily 180 tablet 0  . eplerenone (INSPRA) 25 MG tablet Take 1 tablet (25 mg total) by mouth daily. 30 tablet 6  . furosemide (LASIX) 20 MG tablet Take 1 tablet (20 mg total) by mouth daily as needed. 15 tablet 6  . isosorbide mononitrate (IMDUR) 30 MG 24 hr tablet Take 1 tablet (30 mg total) by mouth as needed. 45 tablet  2  . levothyroxine (SYNTHROID) 50 MCG tablet Take 1 tablet (50 mcg total) by mouth daily before breakfast. 30 tablet 3  . linagliptin (TRADJENTA) 5 MG TABS tablet Take 1 tablet (5 mg total) by mouth daily. 90 tablet 1  . Melatonin 1 MG CAPS Take 1 mg by mouth daily as needed (sleep).     . neomycin-bacitracin-polymyxin (NEOSPORIN) ointment Apply 1 application topically daily as needed for wound care.    . nitroGLYCERIN (NITROSTAT) 0.4 MG SL tablet DISSOLVE ONE TABLET UNDER THE TONGUE EVERY 5 MINUTES AS NEEDED FOR CHEST PAIN.  DO NOT EXCEED A TOTAL OF 3 DOSES IN 15 MINUTES 25 tablet 6  . polyethylene glycol (MIRALAX / GLYCOLAX) packet Take 17 g by mouth daily as needed for mild constipation or moderate constipation.    . rosuvastatin (CRESTOR) 10 MG tablet Take 1 tablet (10 mg total) by mouth daily. TAKE 1 TABLET BY MOUTH ONCE DAILY IN THE EVENING 90 tablet 1  . sodium zirconium cyclosilicate (LOKELMA) 10 g PACK packet Take 10 g by mouth daily. 90 each 0   No current facility-administered medications on file prior to visit.    ALLERGIES: Allergies  Allergen Reactions  . Xanax [Alprazolam] Other (See Comments)    Pt feels very weak, tired and feels paralyzed      FAMILY HISTORY: Family History  Problem Relation Age of Onset  . Heart attack Mother   . Heart disease Mother   . Heart attack Father   . Heart disease Father   . Hypertension Father   . Hypertension Sister   . Heart disease Maternal Grandmother     SOCIAL HISTORY: Social History   Socioeconomic History  . Marital status: Married    Spouse name: Not on file  . Number of children: Not on file  . Years of education: Not on file  . Highest education level: Not on file  Occupational History  . Not on file  Tobacco Use  . Smoking status: Never Smoker  . Smokeless tobacco: Never Used  Substance and Sexual Activity  . Alcohol use: No  . Drug use: No  . Sexual activity: Never  Other Topics Concern  . Not on file    Social History Narrative  . Not on file   Social Determinants of Health   Financial Resource Strain:   . Difficulty of Paying Living Expenses: Not on file  Food Insecurity:   . Worried About Charity fundraiser in  the Last Year: Not on file  . Ran Out of Food in the Last Year: Not on file  Transportation Needs:   . Lack of Transportation (Medical): Not on file  . Lack of Transportation (Non-Medical): Not on file  Physical Activity:   . Days of Exercise per Week: Not on file  . Minutes of Exercise per Session: Not on file  Stress:   . Feeling of Stress : Not on file  Social Connections:   . Frequency of Communication with Friends and Family: Not on file  . Frequency of Social Gatherings with Friends and Family: Not on file  . Attends Religious Services: Not on file  . Active Member of Clubs or Organizations: Not on file  . Attends Archivist Meetings: Not on file  . Marital Status: Not on file  Intimate Partner Violence:   . Fear of Current or Ex-Partner: Not on file  . Emotionally Abused: Not on file  . Physically Abused: Not on file  . Sexually Abused: Not on file    PHYSICAL EXAM: Blood pressure 135/88, pulse (!) 101, resp. rate 18, height 6' (1.829 m), weight 205 lb (93 kg), SpO2 96 %. General: No acute distress.  Patient appears well-groomed.   Head:  Normocephalic/atraumatic Eyes:  fundi examined but not visualized Neck: supple, no paraspinal tenderness, full range of motion Back: No paraspinal tenderness Heart: regular rate and rhythm Lungs: Clear to auscultation bilaterally. Vascular: No carotid bruits. Neurological Exam: Mental status: alert and oriented to person, place, and time, recent and remote memory intact, fund of knowledge intact, attention and concentration intact, speech fluent and not dysarthric, language intact. Cranial nerves: CN I: not tested CN II: pupils equal, round and reactive to light, visual fields intact CN III, IV, VI:  full  range of motion, no nystagmus, no ptosis CN V: facial sensation intact CN VII: upper and lower face symmetric CN VIII: hearing intact CN IX, X: gag intact, uvula midline CN XI: sternocleidomastoid and trapezius muscles intact CN XII: tongue midline Bulk & Tone: normal, no fasciculations. Motor:  5/5 throughout  Sensation:  Pinprick and vibration sensation intact. Deep Tendon Reflexes:  2+ throughout, toes downgoing.   Finger to nose testing:  Without dysmetria.   Heel to shin:  Without dysmetria.   Gait:  Normal station and stride.  Able to turn and tandem walk. Romberg negative.  IMPRESSION: 1.  Probable left hemispheric CVA, cryptogenic.  No atrial fibrillation on device interrogation.  With his low EF, the question is whether he may benefit from anticoagulation rather than antiplatelet therapy.  If MRI of brain was performed at the time and it showed acute strokes that look cardioembolic (particularly if bihemispheric), then anticoagulation is a strong possibility. However, I don't believe anticoagulation is definitively indicated in stroke with low EF in absence of other comorbidity (such as atrial septal defect/aneurysm).  We can check an MRI of the brain now, however 6 weeks out, any stroke found may look chronic without any definite idea of when it occurred.  At this time, he declines MRI.  I did reach out to Dr. Aundra Dubin.    He recommends identifying atrial fibrillation or a definite LV thrombus prior to starting anticoagulation.  He may consider echocardiogram with contrast to assess for LV thrombus. 2.  S/p AICD 3.  Hypertension 4.  Hyperlipidemia 5.  Type 2 diabetes mellitus 6.  CAD   PLAN: 1. Continue ASA 81mg  and Plavix 75mg  daily for secondary stroke prevention  2.  Continue Crestor (LDL at goal of less than 70) 3.  Continue blood pressure control 4.  Optimize glycemic control (Hgb A1c goal less than 7) 5.  Follow up in 4 months.  Thank you for allowing me to take part in  the care of this patient.  Metta Clines, DO  CC:  Loralie Champagne, MD  Sharilyn Sites, MD

## 2019-11-26 ENCOUNTER — Encounter: Payer: Self-pay | Admitting: Neurology

## 2019-11-26 ENCOUNTER — Ambulatory Visit (INDEPENDENT_AMBULATORY_CARE_PROVIDER_SITE_OTHER): Payer: Medicare Other | Admitting: Neurology

## 2019-11-26 ENCOUNTER — Other Ambulatory Visit: Payer: Self-pay

## 2019-11-26 VITALS — BP 135/88 | HR 101 | Resp 18 | Ht 72.0 in | Wt 205.0 lb

## 2019-11-26 DIAGNOSIS — I639 Cerebral infarction, unspecified: Secondary | ICD-10-CM | POA: Diagnosis not present

## 2019-11-26 DIAGNOSIS — E1121 Type 2 diabetes mellitus with diabetic nephropathy: Secondary | ICD-10-CM | POA: Diagnosis not present

## 2019-11-26 DIAGNOSIS — I25700 Atherosclerosis of coronary artery bypass graft(s), unspecified, with unstable angina pectoris: Secondary | ICD-10-CM | POA: Diagnosis not present

## 2019-11-26 DIAGNOSIS — I5023 Acute on chronic systolic (congestive) heart failure: Secondary | ICD-10-CM | POA: Diagnosis not present

## 2019-11-26 DIAGNOSIS — I10 Essential (primary) hypertension: Secondary | ICD-10-CM | POA: Diagnosis not present

## 2019-11-26 DIAGNOSIS — N1831 Chronic kidney disease, stage 3a: Secondary | ICD-10-CM

## 2019-11-26 DIAGNOSIS — I25708 Atherosclerosis of coronary artery bypass graft(s), unspecified, with other forms of angina pectoris: Secondary | ICD-10-CM

## 2019-11-26 NOTE — Patient Instructions (Signed)
No change in medication at this time Follow up in 4 months

## 2019-11-27 ENCOUNTER — Ambulatory Visit: Payer: Medicare Other | Admitting: "Endocrinology

## 2019-11-27 ENCOUNTER — Other Ambulatory Visit (HOSPITAL_COMMUNITY): Payer: Self-pay

## 2019-11-27 DIAGNOSIS — I513 Intracardiac thrombosis, not elsewhere classified: Secondary | ICD-10-CM

## 2019-11-27 NOTE — Progress Notes (Signed)
See note below from MD. Order placed, [place] scheduler aware to schedule        Larey Dresser, MD  Valeda Malm, RN; Branch, Philicia R, CMA  Please have Mr Orton get a limited echo with Definity contrast for LV thrombus

## 2019-11-28 ENCOUNTER — Telehealth (HOSPITAL_COMMUNITY): Payer: Self-pay | Admitting: Vascular Surgery

## 2019-11-28 NOTE — Telephone Encounter (Signed)
Called pt to make echo appt for next week, spoke to pt wife they are out of town she states she will call back to make appt before end of the day today (2/26/) pt need limited echo w/ difinity next 3/1-3/5

## 2019-12-01 ENCOUNTER — Ambulatory Visit (HOSPITAL_COMMUNITY)
Admission: RE | Admit: 2019-12-01 | Discharge: 2019-12-01 | Disposition: A | Discharge status: Home / Self Care | Payer: Medicare Other | Source: Ambulatory Visit | Attending: Cardiology | Admitting: Cardiology

## 2019-12-01 ENCOUNTER — Telehealth: Payer: Self-pay | Admitting: "Endocrinology

## 2019-12-01 ENCOUNTER — Other Ambulatory Visit: Payer: Self-pay

## 2019-12-01 DIAGNOSIS — I509 Heart failure, unspecified: Secondary | ICD-10-CM | POA: Insufficient documentation

## 2019-12-01 DIAGNOSIS — I513 Intracardiac thrombosis, not elsewhere classified: Secondary | ICD-10-CM | POA: Insufficient documentation

## 2019-12-01 DIAGNOSIS — Z950 Presence of cardiac pacemaker: Secondary | ICD-10-CM | POA: Insufficient documentation

## 2019-12-01 DIAGNOSIS — E119 Type 2 diabetes mellitus without complications: Secondary | ICD-10-CM | POA: Diagnosis not present

## 2019-12-01 DIAGNOSIS — E785 Hyperlipidemia, unspecified: Secondary | ICD-10-CM | POA: Diagnosis not present

## 2019-12-01 DIAGNOSIS — Z951 Presence of aortocoronary bypass graft: Secondary | ICD-10-CM | POA: Insufficient documentation

## 2019-12-01 DIAGNOSIS — Z952 Presence of prosthetic heart valve: Secondary | ICD-10-CM | POA: Insufficient documentation

## 2019-12-01 DIAGNOSIS — I110 Hypertensive heart disease with heart failure: Secondary | ICD-10-CM | POA: Diagnosis not present

## 2019-12-01 MED ORDER — PERFLUTREN LIPID MICROSPHERE
1.0000 mL | INTRAVENOUS | Status: AC | PRN
  Administered 2019-12-01: 6 mL via INTRAVENOUS
  Filled 2019-12-01: qty 10

## 2019-12-01 NOTE — Telephone Encounter (Signed)
No, I do not want him to be on Farxiga either.  His diabetes is reasonably and fairly controlled with Tradjenta.  No need to add medications with high risk profile,  specially when kidney function is significantly down.

## 2019-12-01 NOTE — Telephone Encounter (Signed)
Patient's wife called and said that now his heart dr wants him to be on Farxiga. Should he be on that along with the [place]

## 2019-12-01 NOTE — Progress Notes (Signed)
Echocardiogram 2D Echocardiogram has been performed.  Christian Bowman 12/01/2019, 9:49 AM

## 2019-12-01 NOTE — Telephone Encounter (Signed)
Discussed with pt's wife, understanding voiced. 

## 2019-12-02 ENCOUNTER — Other Ambulatory Visit (HOSPITAL_COMMUNITY): Payer: Medicare Other

## 2019-12-11 ENCOUNTER — Other Ambulatory Visit: Payer: Self-pay | Admitting: Cardiovascular Disease

## 2019-12-12 ENCOUNTER — Other Ambulatory Visit: Payer: Self-pay

## 2019-12-12 ENCOUNTER — Other Ambulatory Visit (HOSPITAL_COMMUNITY)
Admission: RE | Admit: 2019-12-12 | Discharge: 2019-12-12 | Disposition: A | Discharge status: Home / Self Care | Payer: Medicare Other | Source: Ambulatory Visit | Attending: Cardiology | Admitting: Cardiology

## 2019-12-12 ENCOUNTER — Other Ambulatory Visit (HOSPITAL_COMMUNITY): Payer: Medicare Other

## 2019-12-12 DIAGNOSIS — Z20822 Contact with and (suspected) exposure to covid-19: Secondary | ICD-10-CM | POA: Diagnosis not present

## 2019-12-12 DIAGNOSIS — Z01812 Encounter for preprocedural laboratory examination: Secondary | ICD-10-CM | POA: Diagnosis not present

## 2019-12-12 DIAGNOSIS — Z23 Encounter for immunization: Secondary | ICD-10-CM | POA: Diagnosis not present

## 2019-12-13 LAB — SARS CORONAVIRUS 2 (TAT 6-24 HRS): SARS Coronavirus 2: NEGATIVE

## 2019-12-15 ENCOUNTER — Ambulatory Visit (HOSPITAL_COMMUNITY): Payer: Medicare Other

## 2019-12-15 ENCOUNTER — Encounter (HOSPITAL_COMMUNITY): Payer: Self-pay

## 2019-12-15 ENCOUNTER — Ambulatory Visit (HOSPITAL_COMMUNITY)
Admission: RE | Admit: 2019-12-15 | Discharge: 2019-12-15 | Disposition: A | Discharge status: Home / Self Care | Payer: Medicare Other | Source: Ambulatory Visit | Attending: Internal Medicine | Admitting: Internal Medicine

## 2019-12-15 ENCOUNTER — Other Ambulatory Visit (HOSPITAL_COMMUNITY): Payer: Self-pay | Admitting: *Deleted

## 2019-12-15 ENCOUNTER — Other Ambulatory Visit: Payer: Self-pay

## 2019-12-15 DIAGNOSIS — I5022 Chronic systolic (congestive) heart failure: Secondary | ICD-10-CM | POA: Insufficient documentation

## 2019-12-24 ENCOUNTER — Telehealth (HOSPITAL_COMMUNITY): Payer: Self-pay

## 2019-12-24 NOTE — Telephone Encounter (Signed)
COVID-19 pre-appointment screening questions:ANSWERED BY PATIENTS WIFE  Do you have a history of COVID-19 or a positive test result in the past 7-10 days? NO  To the best of your knowledge, have you been in close contact with anyone with a confirmed diagnosis of COVID 19?NO  Have you had any one or more of the following: Fever, chills, cough, shortness of breath (out of the normal for you) or any flu-like symptoms?NO  Are you experiencing any of the following symptoms that is new or out of usual for you:NO   Ear, nose or throat discomfort  Sore throat  Headache  Muscle Pain  Diarrhea  Loss of taste or smell   Reviewed all the following with patient: REVIEWED  Use of hand sanitizer when entering the building  Everyone is required to wear a mask in the building, if you do not have a mask we are happy to provide you with one when you arrive  NO Visitor guidelines   If patient answers YES to any of questions they must change to a virtual visit and place note in comments about symptoms

## 2019-12-25 ENCOUNTER — Encounter (HOSPITAL_COMMUNITY): Payer: Self-pay | Admitting: Cardiology

## 2019-12-25 ENCOUNTER — Other Ambulatory Visit: Payer: Self-pay

## 2019-12-25 ENCOUNTER — Ambulatory Visit (HOSPITAL_COMMUNITY)
Admission: RE | Admit: 2019-12-25 | Discharge: 2019-12-25 | Disposition: A | Discharge status: Home / Self Care | Payer: Medicare Other | Source: Ambulatory Visit | Attending: Cardiology | Admitting: Cardiology

## 2019-12-25 VITALS — BP 130/62 | HR 81 | Wt 203.0 lb

## 2019-12-25 DIAGNOSIS — Z7902 Long term (current) use of antithrombotics/antiplatelets: Secondary | ICD-10-CM | POA: Diagnosis not present

## 2019-12-25 DIAGNOSIS — I442 Atrioventricular block, complete: Secondary | ICD-10-CM | POA: Insufficient documentation

## 2019-12-25 DIAGNOSIS — Z7984 Long term (current) use of oral hypoglycemic drugs: Secondary | ICD-10-CM | POA: Insufficient documentation

## 2019-12-25 DIAGNOSIS — Z7982 Long term (current) use of aspirin: Secondary | ICD-10-CM | POA: Diagnosis not present

## 2019-12-25 DIAGNOSIS — I252 Old myocardial infarction: Secondary | ICD-10-CM | POA: Insufficient documentation

## 2019-12-25 DIAGNOSIS — Z951 Presence of aortocoronary bypass graft: Secondary | ICD-10-CM | POA: Diagnosis not present

## 2019-12-25 DIAGNOSIS — Z79899 Other long term (current) drug therapy: Secondary | ICD-10-CM | POA: Insufficient documentation

## 2019-12-25 DIAGNOSIS — Z8249 Family history of ischemic heart disease and other diseases of the circulatory system: Secondary | ICD-10-CM | POA: Diagnosis not present

## 2019-12-25 DIAGNOSIS — E1122 Type 2 diabetes mellitus with diabetic chronic kidney disease: Secondary | ICD-10-CM | POA: Diagnosis not present

## 2019-12-25 DIAGNOSIS — E785 Hyperlipidemia, unspecified: Secondary | ICD-10-CM | POA: Diagnosis not present

## 2019-12-25 DIAGNOSIS — I255 Ischemic cardiomyopathy: Secondary | ICD-10-CM | POA: Diagnosis not present

## 2019-12-25 DIAGNOSIS — I25708 Atherosclerosis of coronary artery bypass graft(s), unspecified, with other forms of angina pectoris: Secondary | ICD-10-CM

## 2019-12-25 DIAGNOSIS — I5022 Chronic systolic (congestive) heart failure: Secondary | ICD-10-CM | POA: Diagnosis not present

## 2019-12-25 DIAGNOSIS — Z8673 Personal history of transient ischemic attack (TIA), and cerebral infarction without residual deficits: Secondary | ICD-10-CM | POA: Insufficient documentation

## 2019-12-25 DIAGNOSIS — Z953 Presence of xenogenic heart valve: Secondary | ICD-10-CM | POA: Insufficient documentation

## 2019-12-25 DIAGNOSIS — Z888 Allergy status to other drugs, medicaments and biological substances status: Secondary | ICD-10-CM | POA: Diagnosis not present

## 2019-12-25 DIAGNOSIS — I2510 Atherosclerotic heart disease of native coronary artery without angina pectoris: Secondary | ICD-10-CM | POA: Diagnosis not present

## 2019-12-25 DIAGNOSIS — Z79890 Hormone replacement therapy (postmenopausal): Secondary | ICD-10-CM | POA: Diagnosis not present

## 2019-12-25 DIAGNOSIS — E039 Hypothyroidism, unspecified: Secondary | ICD-10-CM | POA: Insufficient documentation

## 2019-12-25 DIAGNOSIS — N183 Chronic kidney disease, stage 3 unspecified: Secondary | ICD-10-CM | POA: Insufficient documentation

## 2019-12-25 LAB — CBC
HCT: 44.2 % (ref 39.0–52.0)
Hemoglobin: 14.5 g/dL (ref 13.0–17.0)
MCH: 29.9 pg (ref 26.0–34.0)
MCHC: 32.8 g/dL (ref 30.0–36.0)
MCV: 91.1 fL (ref 80.0–100.0)
Platelets: 151 10*3/uL (ref 150–400)
RBC: 4.85 MIL/uL (ref 4.22–5.81)
RDW: 12.9 % (ref 11.5–15.5)
WBC: 7.4 10*3/uL (ref 4.0–10.5)
nRBC: 0 % (ref 0.0–0.2)

## 2019-12-25 LAB — BASIC METABOLIC PANEL
Anion gap: 9 (ref 5–15)
BUN: 23 mg/dL (ref 8–23)
CO2: 21 mmol/L — ABNORMAL LOW (ref 22–32)
Calcium: 9 mg/dL (ref 8.9–10.3)
Chloride: 108 mmol/L (ref 98–111)
Creatinine, Ser: 1.49 mg/dL — ABNORMAL HIGH (ref 0.61–1.24)
GFR calc Af Amer: 54 mL/min — ABNORMAL LOW (ref >60)
GFR calc non Af Amer: 46 mL/min — ABNORMAL LOW (ref >60)
Glucose, Bld: 126 mg/dL — ABNORMAL HIGH (ref 70–99)
Potassium: 4.7 mmol/L (ref 3.5–5.1)
Sodium: 138 mmol/L (ref 135–145)

## 2019-12-25 MED ORDER — DIGOXIN 125 MCG PO TABS: 0.0625 mg | ORAL_TABLET | Freq: Every day | ORAL | 3 refills | Status: DC

## 2019-12-25 NOTE — Patient Instructions (Addendum)
START Digoxin 0.0625mg  (1/2 tab) daily  Labs today We will only contact you if something comes back abnormal or we need to make some changes. Otherwise no news is good news!  Your physician recommends that you schedule a follow-up appointment in: Keep your next scheduled appt  April Garage Code 5009  Please call office at [phone] option [phone] if you have any questions or concerns.    [facility] [place] [id number] [place] [address] Dept: [phone] Loc: [phone]  Christian Bowman  12/25/2019  You are scheduled for a Cardiac Catheterization on Thursday, April 8 with Dr. Loralie Champagne.  1. Please arrive at the [facility] (Main Entrance A) at [facility]: [address] [place], [place] [address] at 10:00 AM (This time is two hours before your procedure to ensure your preparation). Free valet parking service is available.   Special note: Every effort is made to have your procedure done on time. Please understand that emergencies sometimes delay scheduled procedures.  2. Diet: Do not eat or drink after midnight the night before your procedure.   3. Labs:  You will need a pre procedure COVID test    WHEN: Monday April 5th, 2021 at 11:20am WHERE: [facility]      [place] [address]  This is a drive thru testing site, you will remain in your car. Be sure to get in the line FOR PROCEDURES Once you have been swabbed you will need to remain home in quarantine until you return for your procedure.  4. Medication instructions in preparation for your procedure:   Contrast Allergy: No   *For reference purposes while preparing patient instructions.   Delete this med list prior to printing instructions for patient.*  Hold Lasix and Inspra on the morning of your procedure  Hold Tradjenta on the morning of your procedure  Can take all other medications  with a small sip of water  On the morning of your procedure, take your Aspirin and Plavix and any morning medicines NOT listed above.  You may use sips of water.  5. Plan for one night stay--bring personal belongings. 6. Bring a current list of your medications and current insurance cards. 7. You MUST have a responsible person to drive you home. 8. Someone MUST be with you the first 24 hours after you arrive home or your discharge will be delayed. 9. Please wear clothes that are easy to get on and off and wear slip-on shoes.  Thank you for allowing [place] to care for you!   -- [facility]

## 2019-12-26 ENCOUNTER — Other Ambulatory Visit (HOSPITAL_COMMUNITY): Payer: Self-pay

## 2019-12-26 DIAGNOSIS — I5022 Chronic systolic (congestive) heart failure: Secondary | ICD-10-CM

## 2019-12-26 MED ORDER — SODIUM CHLORIDE 0.9% FLUSH: 3.0000 mL | Freq: Two times a day (BID) | INTRAVENOUS | Status: DC

## 2019-12-26 NOTE — H&P (View-Only) (Signed)
Date:  12/26/2019   ID:  [name], DOB May 11, 1946, MRN [id number]   Provider location: [facility] Type of Visit: Established patient   PCP:  Sharilyn Sites, MD  Cardiologist: Dr. Sallyanne Kuster  HF Cardiologist:  Loralie Champagne, MD   History of Present Illness: Christian Bowman is a 74 y.o. male with history of CAD s/p CABG in 2007 then redo CABG in 10/17 with mitral valve replacement.  He was admitted in 10/17 for redo CABG with SVG-PDA and SVG-ramus.  He also had mitral valve replacement with a bioprosthetic valve because of infarct-related mitral regurgitation.  Post-operative course was complicated by CHF requiring diuresis.  He also had atrial flutter and required DCCV.  Due to complete heart block, he later got a CRT-D system.    At a prior visit, he was in atrial flutter.  He saw Dr. Curt Bears, it was decided to arrange for DCCV with ablation down the road when PPM leads have been in longer.  He had successful DCCV in 12/17 and is in NSR today.   He was admitted in 3/18 with NSTEMI and chest pain. TnI only 0.5.  LHC showed occlusion of SVG-PDA from CABG#1 to be the likely culprit.  However, SVG-PDA from CABG#2 was patent.  No intervention.  Echo in 3/18 from [place] showed EF 30-35%, stable bioprosthetic mitral valve.   He had atrial flutter ablation in 8/18.  He is in NSR today and is off amiodarone.    In 11/18, he had an inguinal hernia repair. Post-op, he had an NSTEMI.  LHC showed occlusion of a PLV branch that had been backfilled by SVG-PDA (prior cath had shown severe diffuse disease in the PLV).  Echo in 11/18 showed EF 20-25% with normal bioprosthetic mitral valve.    With no recurrence of atrial arrhythmias, he is now off anticoagulation.   Echo in 7/20 showed EF 25-30%, bioprosthetic MV with no MR and mean gradient 5 mmHg, normal RV.    In 1/21, he developed right hand weakness and dysarthria concerning for CVA.  He went to the ER in [place] and  had tPA.  Symptoms resolved.  Atrial fibrillation was not noted per his wife's report.  CTA head/neck showed no significant carotid stenosis.  He was started on Plavix 75 mg daily and sent home.    CPX (3/21) showed severe functional impairment due to HF.  Echo showed EF 20-25%, normal RV, normally functioning bioprosthetic mitral valve.   He returns for followup of CHF.  He seems to have significantly worsened symptomatically over the last few month.  He is now short of breath walking around his house.  He was short of breath/fatigued walking in the office today.  Walking 25 yards is difficult.  No orthopnea/PND.  No lightheadedness. He never started [place] as his wife was concerned that it would affect his kidney function.   Medtronic device interrogation: 93% BiV paced, stable thoracic impedance, no AF/VT.    ECG (personally reviewed): NSR, BiV pacing    Labs (11/17): K 4.8, creatinine 1.89, hgb 12.3, digoxin 0.9, LFTs normal, TSH 8.235 (mild increase), free T3 low, free T4 normal, LFTs normal.  Labs (12/17): K 4.5, creatinine 1.7, BNP 3909 Labs (3/18): K 3.9, creatinine 1.48, hgb 11.3 Labs (4/18): digoxin 0.5, LFTs normal Labs (7/18): K 5.1, creatinine 1.78, LDL 63, HDL 29, TSH elevated Labs (11/18): K 4.5, creatinine 1.86, hgb 13.6 Labs (12/18): K 5, creatinine 1.88 Labs (1/19): digoxin  0.3, K 4.7, creatinine 1.83 Labs (6/19): LDL 70, HDL 32, K 5.1, creatinine 1.88 Labs (12/19): LDL 60, HDL 31 Labs (1/20): K 5.1, creatinine 1.64 Labs (4/20): K 4.5, creatinine 1.77 Labs (8/20): K 4.6, creatinine 1.5 Labs (1/21): K 4.5, creatinine 1.58, LDL 69 Labs (2/21): K 4.5, creatinine 1.66  PMH: 1. CAD: CABG 2007.  - LHC (9/17) with patent LIMA-LAD, totally occluded SVG-D, severe stenosis in SVG-PDA.  - Redo CABG 10/17 with SVG-PDA, SVG-ramus and mitral valve replacement.  - NSTEMI 3/18.  LHC with culprit lesion likely occlusion of SVG-PDA from CABG#1.  Patent SVG-PDA from CABG#2.  No  intervention.  - NSTEMI 11/18 post-op inguinal hernia repair.  LHC with occlusion of a PLV branch that had been backfilled by SVG-PDA (prior cath had shown severe diffuse disease in the PLV).  2. Mitral regurgitation: Ischemic MR, mitral valve replacement was done in 10/17 (bioprosthetic). 3. Complete heart block: Post-op in 10/17.  Medtronic CRT-D placed.  4. Atrial flutter: DCCV 10/17 and again in 12/17.  - Ablation 8/18, now off amiodarone.  5. Type II diabetes 6. Hyperlipidemia 7. Chronic systolic CHF: Ischemic cardiomyopathy.   - TEE (10/17): EF 20-25%, severe LV dilation - Echo (3/18, [place]): EF 30-35%, bioprosthetic mitral valve with mean gradient 5 mmHg.  - Echo (11/18): EF 20-25%, moderate LV dilation, normal bioprosthetic mitral valve.  - Echo (7/20): EF 25-30%, moderate LV dilation, inferior/inferolateral AK, bioprosthetic MV with mean gradient 5 and no MR, normal RV.  - Painful gynecomastia with spironolactone.  - Echo (1/21): EF < 20%, normal RV size and systolic function, bioprosthetic mitral valve with mild MR, mean gradient 2 mmHg.  - CPX (3/21): peak VO2 11.2, VE/VCO2 slope 56, RER 1.21.  - Echo (3/21): EF 20-25%, no LV thrombus, RV normal, bioprosthetic mitral valve appear normal.  8. Hypothyroidism 9. CVA 1/21: CTA head/neck with no carotid stenosis.   Current Outpatient Medications  Medication Sig Dispense Refill  . amoxicillin (AMOXIL) 500 MG tablet Take 4 tablets (2,000 mg total) by mouth as needed. Pre-dental work only. 4 tablet 3  . aspirin EC 81 MG tablet Take 81 mg by mouth daily.     . carvedilol (COREG) 12.5 MG tablet Take 1.5 tablets (18.75 mg total) by mouth 2 (two) times daily. 180 tablet 3  . cholecalciferol (VITAMIN D3) 25 MCG (1000 UNIT) tablet Take 1,000 Units by mouth daily as needed.     . clopidogrel (PLAVIX) 75 MG tablet Take 75 mg by mouth daily.    [name] ENTRESTO 49-51 MG Take 1 tablet by mouth twice daily 180 tablet 0  . eplerenone (INSPRA) 25 MG  tablet Take 1 tablet (25 mg total) by mouth daily. 30 tablet 6  . furosemide (LASIX) 20 MG tablet Take 1 tablet (20 mg total) by mouth daily as needed. 15 tablet 6  . isosorbide mononitrate (IMDUR) 30 MG 24 hr tablet Take 1 tablet (30 mg total) by mouth as needed. 45 tablet 2  . levothyroxine (SYNTHROID) 50 MCG tablet Take 1 tablet (50 mcg total) by mouth daily before breakfast. 30 tablet 3  . linagliptin (TRADJENTA) 5 MG TABS tablet Take 1 tablet (5 mg total) by mouth daily. 90 tablet 1  . Melatonin 1 MG CAPS Take 1 mg by mouth daily as needed (sleep).     . neomycin-bacitracin-polymyxin (NEOSPORIN) ointment Apply 1 application topically daily as needed for wound care.    . nitroGLYCERIN (NITROSTAT) 0.4 MG SL tablet DISSOLVE ONE TABLET UNDER THE TONGUE  EVERY 5 MINUTES AS NEEDED FOR CHEST PAIN.  DO NOT EXCEED A TOTAL OF 3 DOSES IN 15 MINUTES 25 tablet 6  . polyethylene glycol (MIRALAX / GLYCOLAX) packet Take 17 g by mouth daily as needed for mild constipation or moderate constipation.    . rosuvastatin (CRESTOR) 10 MG tablet TAKE 1 TABLET BY MOUTH ONCE DAILY IN THE EVENING 90 tablet 0  . sodium zirconium cyclosilicate (LOKELMA) 10 g PACK packet Take 10 g by mouth daily. 90 each 0  . digoxin (LANOXIN) 0.125 MG tablet Take 0.5 tablets (0.0625 mg total) by mouth daily. 15 tablet 3   No current facility-administered medications for this encounter.    Allergies:   Xanax [alprazolam]   Social History:  The patient  reports that he has never smoked. He has never used smokeless tobacco. He reports that he does not drink alcohol or use drugs.   Family History:  The patient's family history includes Heart attack in his father and mother; Heart disease in his father, maternal grandmother, and mother; Hypertension in his father and sister.   ROS:  Please see the history of present illness.   All other systems are personally reviewed and negative.   Exam:   BP 130/62   Pulse 81   Wt 92.1 kg (203 lb)    SpO2 98%   BMI 27.53 kg/m   General: NAD Neck: JVP 8 cm, no thyromegaly or thyroid nodule.  Lungs: Clear to auscultation bilaterally with normal respiratory effort. CV: Nondisplaced PMI.  Heart regular S1/S2, no S3/S4, no murmur.  Trace ankle edema.  No carotid bruit.  Normal pedal pulses.  Abdomen: Soft, nontender, no hepatosplenomegaly, no distention.  Skin: Intact without lesions or rashes.  Neurologic: Alert and oriented x 3.  Psych: Normal affect. Extremities: No clubbing or cyanosis.  HEENT: Normal.   Recent Labs: 10/17/2019: ALT 15; B Natriuretic Peptide 4,254.3; TSH 4.227 12/25/2019: BUN 23; Creatinine, Ser 1.49; Hemoglobin 14.5; Platelets 151; Potassium 4.7; Sodium 138  Personally reviewed   Wt Readings from Last 3 Encounters:  12/25/19 92.1 kg (203 lb)  11/26/19 93 kg (205 lb)  11/24/19 91.5 kg (201 lb 12.8 oz)      ASSESSMENT AND PLAN:  1. Chronic systolic CHF: Ischemic cardiomyopathy.  TEE 10/17 with EF 20-25%.  Echo in 3/21 shoed EF 20-25%, normal RV function, normal bioprosthetic mitral valve.  CPX in 3/21 showed severe functional impairment due to HF with concern for poor short-term prognosis.  Symptoms have been worse for the last few months, now NYHA class IIIb.  He is not volume overloaded on exam or by Optivol, so I am concerned that his symptoms derive from low output.    - Continue to use Lasix prn.    - Continue Entresto 49/51 bid.  Would not increase with history of orthostatic-type symptoms.  BMET today.  - Continue eplerenone 25 mg daily.  Gynecomastia has resolved off spironolactone.  - Continue Coreg 18.75 mg bid.    - He has been on Lokelma chronically to control hyperkalemia and allow use of eplerenone and Entresto.  - His wife is concerned about using [place] and says that his endocrinologist did not want him to start it.  I will send a message to Dr. Dorris Fetch to see what he was thinking. I think it would potentially would be a good medication for him given  systolic CHF and CKD.   - I am going to start him back on digoxin 0.0625 (took in the past  but has been off).  - BMET today.  - I am going to arrange for RHC to assess cardiac output. Discussed risks/benefits and he agrees to procedure.  - If he has a low cardiac output matching the poor CPX, we will need serious consideration of advanced therapies given poor prognosis. He is not a transplant candidate.  He has had 2 prior sternotomies, which makes LVAD more complicated.  We would need to think about approach from left lateral thoracotomy.  He is not sure that he would want another cardiac surgery, as he had a very hard time with the last surgery.  2. CAD: s/p redo CABG. Admission in 3/18 with NSTEMI, LHC showed occlusion of SVG-PDA from CABG#1 but patent SVG-PDA from CABG#2, no intervention.  He had a post-op NSTEMI after inguinal hernia repair in 11/18.  LHC showed occlusion of a PLV branch that had been backfilled by SVG-PDA (prior cath had shown severe diffuse disease in the PLV). No recent chest pain.     - Continue statin => Crestor 10 mg daily. Good lipids in 1/21.    - Continue ASA 81. - Avoid future elective surgery.  3. Bioprosthetic mitral valve: Stable 3/21 echo with no significant regurgitation or stenosis.   4. Atrial flutter: s/p ablation in 8/18.  He is in NSR and off amiodarone.  - Now off warfarin with flutter ablation and no recurrence. If he has recurrence of atrial arrhythmias, based on most recent data DOAC would be a reasonable choice for him even with bioprosthetic MV.  5. CKD: Stage III.  BMET.   6. Type II diabetes: We have discussed Wilder Glade, his wife is reluctant and apparently his endocrinologist told them not to take.  Will message his endocrinologist.  7. CVA: In 1/21, got tPA at the ER in [place].  Symptoms resolved.  CTA head/neck showed no carotid stenosis.  Device interrogation today showed no atrial fibrillation. If atrial fibrillation is noted on future monitoring,  will need anticoagulation.  - Continue ASA and Plavix 75 daily.  - Continue Crestor.   Arrange RHC, followup afterwards.   Signed, Loralie Champagne, MD  12/26/2019  [facility] [address] [facility] [place] [address] [phone] (office) [phone] (fax)

## 2019-12-26 NOTE — Progress Notes (Signed)
Date:  12/26/2019   ID:  Christian Bowman, DOB April 20, 1946, MRN [id number]   Provider location: [facility] Type of Visit: Established patient   PCP:  Sharilyn Sites, MD  Cardiologist: Dr. Sallyanne Kuster  HF Cardiologist:  Loralie Champagne, MD   History of Present Illness: Christian Bowman is a 74 y.o. male with history of CAD s/p CABG in 2007 then redo CABG in 10/17 with mitral valve replacement.  He was admitted in 10/17 for redo CABG with SVG-PDA and SVG-ramus.  He also had mitral valve replacement with a bioprosthetic valve because of infarct-related mitral regurgitation.  Post-operative course was complicated by CHF requiring diuresis.  He also had atrial flutter and required DCCV.  Due to complete heart block, he later got a CRT-D system.    At a prior visit, he was in atrial flutter.  He saw Dr. Curt Bears, it was decided to arrange for DCCV with ablation down the road when PPM leads have been in longer.  He had successful DCCV in 12/17 and is in NSR today.   He was admitted in 3/18 with NSTEMI and chest pain. TnI only 0.5.  LHC showed occlusion of SVG-PDA from CABG#1 to be the likely culprit.  However, SVG-PDA from CABG#2 was patent.  No intervention.  Echo in 3/18 from [place] showed EF 30-35%, stable bioprosthetic mitral valve.   He had atrial flutter ablation in 8/18.  He is in NSR today and is off amiodarone.    In 11/18, he had an inguinal hernia repair. Post-op, he had an NSTEMI.  LHC showed occlusion of a PLV branch that had been backfilled by SVG-PDA (prior cath had shown severe diffuse disease in the PLV).  Echo in 11/18 showed EF 20-25% with normal bioprosthetic mitral valve.    With no recurrence of atrial arrhythmias, he is now off anticoagulation.   Echo in 7/20 showed EF 25-30%, bioprosthetic MV with no MR and mean gradient 5 mmHg, normal RV.    In 1/21, he developed right hand weakness and dysarthria concerning for CVA.  He went to the ER in [place] and  had tPA.  Symptoms resolved.  Atrial fibrillation was not noted per his wife's report.  CTA head/neck showed no significant carotid stenosis.  He was started on Plavix 75 mg daily and sent home.    CPX (3/21) showed severe functional impairment due to HF.  Echo showed EF 20-25%, normal RV, normally functioning bioprosthetic mitral valve.   He returns for followup of CHF.  He seems to have significantly worsened symptomatically over the last few month.  He is now short of breath walking around his house.  He was short of breath/fatigued walking in the office today.  Walking 25 yards is difficult.  No orthopnea/PND.  No lightheadedness. He never started [place] as his wife was concerned that it would affect his kidney function.   Medtronic device interrogation: 93% BiV paced, stable thoracic impedance, no AF/VT.    ECG (personally reviewed): NSR, BiV pacing    Labs (11/17): K 4.8, creatinine 1.89, hgb 12.3, digoxin 0.9, LFTs normal, TSH 8.235 (mild increase), free T3 low, free T4 normal, LFTs normal.  Labs (12/17): K 4.5, creatinine 1.7, BNP 3909 Labs (3/18): K 3.9, creatinine 1.48, hgb 11.3 Labs (4/18): digoxin 0.5, LFTs normal Labs (7/18): K 5.1, creatinine 1.78, LDL 63, HDL 29, TSH elevated Labs (11/18): K 4.5, creatinine 1.86, hgb 13.6 Labs (12/18): K 5, creatinine 1.88 Labs (1/19): digoxin  0.3, K 4.7, creatinine 1.83 Labs (6/19): LDL 70, HDL 32, K 5.1, creatinine 1.88 Labs (12/19): LDL 60, HDL 31 Labs (1/20): K 5.1, creatinine 1.64 Labs (4/20): K 4.5, creatinine 1.77 Labs (8/20): K 4.6, creatinine 1.5 Labs (1/21): K 4.5, creatinine 1.58, LDL 69 Labs (2/21): K 4.5, creatinine 1.66  PMH: 1. CAD: CABG 2007.  - LHC (9/17) with patent LIMA-LAD, totally occluded SVG-D, severe stenosis in SVG-PDA.  - Redo CABG 10/17 with SVG-PDA, SVG-ramus and mitral valve replacement.  - NSTEMI 3/18.  LHC with culprit lesion likely occlusion of SVG-PDA from CABG#1.  Patent SVG-PDA from CABG#2.  No  intervention.  - NSTEMI 11/18 post-op inguinal hernia repair.  LHC with occlusion of a PLV branch that had been backfilled by SVG-PDA (prior cath had shown severe diffuse disease in the PLV).  2. Mitral regurgitation: Ischemic MR, mitral valve replacement was done in 10/17 (bioprosthetic). 3. Complete heart block: Post-op in 10/17.  Medtronic CRT-D placed.  4. Atrial flutter: DCCV 10/17 and again in 12/17.  - Ablation 8/18, now off amiodarone.  5. Type II diabetes 6. Hyperlipidemia 7. Chronic systolic CHF: Ischemic cardiomyopathy.   - TEE (10/17): EF 20-25%, severe LV dilation - Echo (3/18, [place]): EF 30-35%, bioprosthetic mitral valve with mean gradient 5 mmHg.  - Echo (11/18): EF 20-25%, moderate LV dilation, normal bioprosthetic mitral valve.  - Echo (7/20): EF 25-30%, moderate LV dilation, inferior/inferolateral AK, bioprosthetic MV with mean gradient 5 and no MR, normal RV.  - Painful gynecomastia with spironolactone.  - Echo (1/21): EF < 20%, normal RV size and systolic function, bioprosthetic mitral valve with mild MR, mean gradient 2 mmHg.  - CPX (3/21): peak VO2 11.2, VE/VCO2 slope 56, RER 1.21.  - Echo (3/21): EF 20-25%, no LV thrombus, RV normal, bioprosthetic mitral valve appear normal.  8. Hypothyroidism 9. CVA 1/21: CTA head/neck with no carotid stenosis.   Current Outpatient Medications  Medication Sig Dispense Refill  . amoxicillin (AMOXIL) 500 MG tablet Take 4 tablets (2,000 mg total) by mouth as needed. Pre-dental work only. 4 tablet 3  . aspirin EC 81 MG tablet Take 81 mg by mouth daily.     . carvedilol (COREG) 12.5 MG tablet Take 1.5 tablets (18.75 mg total) by mouth 2 (two) times daily. 180 tablet 3  . cholecalciferol (VITAMIN D3) 25 MCG (1000 UNIT) tablet Take 1,000 Units by mouth daily as needed.     . clopidogrel (PLAVIX) 75 MG tablet Take 75 mg by mouth daily.    [name] ENTRESTO 49-51 MG Take 1 tablet by mouth twice daily 180 tablet 0  . eplerenone (INSPRA) 25 MG  tablet Take 1 tablet (25 mg total) by mouth daily. 30 tablet 6  . furosemide (LASIX) 20 MG tablet Take 1 tablet (20 mg total) by mouth daily as needed. 15 tablet 6  . isosorbide mononitrate (IMDUR) 30 MG 24 hr tablet Take 1 tablet (30 mg total) by mouth as needed. 45 tablet 2  . levothyroxine (SYNTHROID) 50 MCG tablet Take 1 tablet (50 mcg total) by mouth daily before breakfast. 30 tablet 3  . linagliptin (TRADJENTA) 5 MG TABS tablet Take 1 tablet (5 mg total) by mouth daily. 90 tablet 1  . Melatonin 1 MG CAPS Take 1 mg by mouth daily as needed (sleep).     . neomycin-bacitracin-polymyxin (NEOSPORIN) ointment Apply 1 application topically daily as needed for wound care.    . nitroGLYCERIN (NITROSTAT) 0.4 MG SL tablet DISSOLVE ONE TABLET UNDER THE TONGUE  EVERY 5 MINUTES AS NEEDED FOR CHEST PAIN.  DO NOT EXCEED A TOTAL OF 3 DOSES IN 15 MINUTES 25 tablet 6  . polyethylene glycol (MIRALAX / GLYCOLAX) packet Take 17 g by mouth daily as needed for mild constipation or moderate constipation.    . rosuvastatin (CRESTOR) 10 MG tablet TAKE 1 TABLET BY MOUTH ONCE DAILY IN THE EVENING 90 tablet 0  . sodium zirconium cyclosilicate (LOKELMA) 10 g PACK packet Take 10 g by mouth daily. 90 each 0  . digoxin (LANOXIN) 0.125 MG tablet Take 0.5 tablets (0.0625 mg total) by mouth daily. 15 tablet 3   No current facility-administered medications for this encounter.    Allergies:   Xanax [alprazolam]   Social History:  The patient  reports that he has never smoked. He has never used smokeless tobacco. He reports that he does not drink alcohol or use drugs.   Family History:  The patient's family history includes Heart attack in his father and mother; Heart disease in his father, maternal grandmother, and mother; Hypertension in his father and sister.   ROS:  Please see the history of present illness.   All other systems are personally reviewed and negative.   Exam:   BP 130/62   Pulse 81   Wt 92.1 kg (203 lb)    SpO2 98%   BMI 27.53 kg/m   General: NAD Neck: JVP 8 cm, no thyromegaly or thyroid nodule.  Lungs: Clear to auscultation bilaterally with normal respiratory effort. CV: Nondisplaced PMI.  Heart regular S1/S2, no S3/S4, no murmur.  Trace ankle edema.  No carotid bruit.  Normal pedal pulses.  Abdomen: Soft, nontender, no hepatosplenomegaly, no distention.  Skin: Intact without lesions or rashes.  Neurologic: Alert and oriented x 3.  Psych: Normal affect. Extremities: No clubbing or cyanosis.  HEENT: Normal.   Recent Labs: 10/17/2019: ALT 15; B Natriuretic Peptide 4,254.3; TSH 4.227 12/25/2019: BUN 23; Creatinine, Ser 1.49; Hemoglobin 14.5; Platelets 151; Potassium 4.7; Sodium 138  Personally reviewed   Wt Readings from Last 3 Encounters:  12/25/19 92.1 kg (203 lb)  11/26/19 93 kg (205 lb)  11/24/19 91.5 kg (201 lb 12.8 oz)      ASSESSMENT AND PLAN:  1. Chronic systolic CHF: Ischemic cardiomyopathy.  TEE 10/17 with EF 20-25%.  Echo in 3/21 shoed EF 20-25%, normal RV function, normal bioprosthetic mitral valve.  CPX in 3/21 showed severe functional impairment due to HF with concern for poor short-term prognosis.  Symptoms have been worse for the last few months, now NYHA class IIIb.  He is not volume overloaded on exam or by Optivol, so I am concerned that his symptoms derive from low output.    - Continue to use Lasix prn.    - Continue Entresto 49/51 bid.  Would not increase with history of orthostatic-type symptoms.  BMET today.  - Continue eplerenone 25 mg daily.  Gynecomastia has resolved off spironolactone.  - Continue Coreg 18.75 mg bid.    - He has been on Lokelma chronically to control hyperkalemia and allow use of eplerenone and Entresto.  - His wife is concerned about using [place] and says that his endocrinologist did not want him to start it.  I will send a message to Dr. Dorris Fetch to see what he was thinking. I think it would potentially would be a good medication for him given  systolic CHF and CKD.   - I am going to start him back on digoxin 0.0625 (took in the past  but has been off).  - BMET today.  - I am going to arrange for RHC to assess cardiac output. Discussed risks/benefits and he agrees to procedure.  - If he has a low cardiac output matching the poor CPX, we will need serious consideration of advanced therapies given poor prognosis. He is not a transplant candidate.  He has had 2 prior sternotomies, which makes LVAD more complicated.  We would need to think about approach from left lateral thoracotomy.  He is not sure that he would want another cardiac surgery, as he had a very hard time with the last surgery.  2. CAD: s/p redo CABG. Admission in 3/18 with NSTEMI, LHC showed occlusion of SVG-PDA from CABG#1 but patent SVG-PDA from CABG#2, no intervention.  He had a post-op NSTEMI after inguinal hernia repair in 11/18.  LHC showed occlusion of a PLV branch that had been backfilled by SVG-PDA (prior cath had shown severe diffuse disease in the PLV). No recent chest pain.     - Continue statin => Crestor 10 mg daily. Good lipids in 1/21.    - Continue ASA 81. - Avoid future elective surgery.  3. Bioprosthetic mitral valve: Stable 3/21 echo with no significant regurgitation or stenosis.   4. Atrial flutter: s/p ablation in 8/18.  He is in NSR and off amiodarone.  - Now off warfarin with flutter ablation and no recurrence. If he has recurrence of atrial arrhythmias, based on most recent data DOAC would be a reasonable choice for him even with bioprosthetic MV.  5. CKD: Stage III.  BMET.   6. Type II diabetes: We have discussed Wilder Glade, his wife is reluctant and apparently his endocrinologist told them not to take.  Will message his endocrinologist.  7. CVA: In 1/21, got tPA at the ER in [place].  Symptoms resolved.  CTA head/neck showed no carotid stenosis.  Device interrogation today showed no atrial fibrillation. If atrial fibrillation is noted on future monitoring,  will need anticoagulation.  - Continue ASA and Plavix 75 daily.  - Continue Crestor.   Arrange RHC, followup afterwards.   Signed, Loralie Champagne, MD  12/26/2019  [facility] [address] [facility] [place] [address] [phone] (office) [phone] (fax)

## 2019-12-29 ENCOUNTER — Other Ambulatory Visit (HOSPITAL_COMMUNITY): Payer: Self-pay | Admitting: Cardiology

## 2019-12-31 DIAGNOSIS — Z23 Encounter for immunization: Secondary | ICD-10-CM | POA: Diagnosis not present

## 2020-01-05 ENCOUNTER — Other Ambulatory Visit (HOSPITAL_COMMUNITY): Payer: Medicare Other

## 2020-01-05 ENCOUNTER — Other Ambulatory Visit (HOSPITAL_COMMUNITY)
Admission: RE | Admit: 2020-01-05 | Discharge: 2020-01-05 | Disposition: A | Discharge status: Home / Self Care | Payer: Medicare Other | Source: Ambulatory Visit | Attending: Cardiology | Admitting: Cardiology

## 2020-01-05 ENCOUNTER — Other Ambulatory Visit: Payer: Self-pay

## 2020-01-05 DIAGNOSIS — Z01812 Encounter for preprocedural laboratory examination: Secondary | ICD-10-CM | POA: Diagnosis not present

## 2020-01-05 DIAGNOSIS — Z20822 Contact with and (suspected) exposure to covid-19: Secondary | ICD-10-CM | POA: Diagnosis not present

## 2020-01-05 LAB — SARS CORONAVIRUS 2 (TAT 6-24 HRS): SARS Coronavirus 2: NEGATIVE

## 2020-01-08 ENCOUNTER — Other Ambulatory Visit: Payer: Self-pay

## 2020-01-08 ENCOUNTER — Encounter (HOSPITAL_COMMUNITY)
Admission: RE | Disposition: A | Discharge status: Home / Self Care | Payer: Self-pay | Source: Home / Self Care | Attending: Cardiology

## 2020-01-08 ENCOUNTER — Ambulatory Visit (HOSPITAL_COMMUNITY)
Admission: RE | Admit: 2020-01-08 | Discharge: 2020-01-08 | Disposition: A | Discharge status: Home / Self Care | Payer: Medicare Other | Attending: Cardiology | Admitting: Cardiology

## 2020-01-08 DIAGNOSIS — Z8249 Family history of ischemic heart disease and other diseases of the circulatory system: Secondary | ICD-10-CM | POA: Diagnosis not present

## 2020-01-08 DIAGNOSIS — Z7902 Long term (current) use of antithrombotics/antiplatelets: Secondary | ICD-10-CM | POA: Insufficient documentation

## 2020-01-08 DIAGNOSIS — Z7982 Long term (current) use of aspirin: Secondary | ICD-10-CM | POA: Diagnosis not present

## 2020-01-08 DIAGNOSIS — I5022 Chronic systolic (congestive) heart failure: Secondary | ICD-10-CM | POA: Diagnosis not present

## 2020-01-08 DIAGNOSIS — Z953 Presence of xenogenic heart valve: Secondary | ICD-10-CM | POA: Diagnosis not present

## 2020-01-08 DIAGNOSIS — Z7984 Long term (current) use of oral hypoglycemic drugs: Secondary | ICD-10-CM | POA: Insufficient documentation

## 2020-01-08 DIAGNOSIS — I4892 Unspecified atrial flutter: Secondary | ICD-10-CM | POA: Insufficient documentation

## 2020-01-08 DIAGNOSIS — E1122 Type 2 diabetes mellitus with diabetic chronic kidney disease: Secondary | ICD-10-CM | POA: Insufficient documentation

## 2020-01-08 DIAGNOSIS — Z951 Presence of aortocoronary bypass graft: Secondary | ICD-10-CM | POA: Insufficient documentation

## 2020-01-08 DIAGNOSIS — E039 Hypothyroidism, unspecified: Secondary | ICD-10-CM | POA: Insufficient documentation

## 2020-01-08 DIAGNOSIS — N183 Chronic kidney disease, stage 3 unspecified: Secondary | ICD-10-CM | POA: Insufficient documentation

## 2020-01-08 DIAGNOSIS — Z8774 Personal history of (corrected) congenital malformations of heart and circulatory system: Secondary | ICD-10-CM | POA: Insufficient documentation

## 2020-01-08 DIAGNOSIS — Z8673 Personal history of transient ischemic attack (TIA), and cerebral infarction without residual deficits: Secondary | ICD-10-CM | POA: Diagnosis not present

## 2020-01-08 DIAGNOSIS — E785 Hyperlipidemia, unspecified: Secondary | ICD-10-CM | POA: Insufficient documentation

## 2020-01-08 DIAGNOSIS — Z79890 Hormone replacement therapy (postmenopausal): Secondary | ICD-10-CM | POA: Insufficient documentation

## 2020-01-08 DIAGNOSIS — Z888 Allergy status to other drugs, medicaments and biological substances status: Secondary | ICD-10-CM | POA: Insufficient documentation

## 2020-01-08 DIAGNOSIS — I509 Heart failure, unspecified: Secondary | ICD-10-CM | POA: Diagnosis not present

## 2020-01-08 DIAGNOSIS — Z79899 Other long term (current) drug therapy: Secondary | ICD-10-CM | POA: Diagnosis not present

## 2020-01-08 DIAGNOSIS — Z95810 Presence of automatic (implantable) cardiac defibrillator: Secondary | ICD-10-CM | POA: Diagnosis not present

## 2020-01-08 DIAGNOSIS — I252 Old myocardial infarction: Secondary | ICD-10-CM | POA: Insufficient documentation

## 2020-01-08 DIAGNOSIS — I255 Ischemic cardiomyopathy: Secondary | ICD-10-CM | POA: Diagnosis not present

## 2020-01-08 HISTORY — PX: RIGHT HEART CATH: CATH118263

## 2020-01-08 LAB — BASIC METABOLIC PANEL
Anion gap: 10 (ref 5–15)
BUN: 27 mg/dL — ABNORMAL HIGH (ref 8–23)
CO2: 24 mmol/L (ref 22–32)
Calcium: 8.7 mg/dL — ABNORMAL LOW (ref 8.9–10.3)
Chloride: 106 mmol/L (ref 98–111)
Creatinine, Ser: 1.63 mg/dL — ABNORMAL HIGH (ref 0.61–1.24)
GFR calc Af Amer: 48 mL/min — ABNORMAL LOW (ref >60)
GFR calc non Af Amer: 41 mL/min — ABNORMAL LOW (ref >60)
Glucose, Bld: 139 mg/dL — ABNORMAL HIGH (ref 70–99)
Potassium: 4.1 mmol/L (ref 3.5–5.1)
Sodium: 140 mmol/L (ref 135–145)

## 2020-01-08 LAB — POCT I-STAT EG7
Bicarbonate: 25.6 mmol/L (ref 20.0–28.0)
Bicarbonate: 25.7 mmol/L (ref 20.0–28.0)
Calcium, Ion: 1.27 mmol/L (ref 1.15–1.40)
Calcium, Ion: 1.28 mmol/L (ref 1.15–1.40)
HCT: 40 % (ref 39.0–52.0)
HCT: 40 % (ref 39.0–52.0)
Hemoglobin: 13.6 g/dL (ref 13.0–17.0)
Hemoglobin: 13.6 g/dL (ref 13.0–17.0)
O2 Saturation: 67 %
O2 Saturation: 69 %
Potassium: 3.8 mmol/L (ref 3.5–5.1)
Potassium: 3.8 mmol/L (ref 3.5–5.1)
Sodium: 142 mmol/L (ref 135–145)
Sodium: 143 mmol/L (ref 135–145)
TCO2: 27 mmol/L (ref 22–32)
TCO2: 27 mmol/L (ref 22–32)
pCO2, Ven: 46 mmHg (ref 44.0–60.0)
pCO2, Ven: 46.2 mmHg (ref 44.0–60.0)
pH, Ven: 7.352 (ref 7.250–7.430)
pH, Ven: 7.355 (ref 7.250–7.430)
pO2, Ven: 37 mmHg (ref 32.0–45.0)
pO2, Ven: 38 mmHg (ref 32.0–45.0)

## 2020-01-08 LAB — GLUCOSE, CAPILLARY
Glucose-Capillary: 131 mg/dL — ABNORMAL HIGH (ref 70–99)
Glucose-Capillary: 107 mg/dL — ABNORMAL HIGH (ref 70–99)

## 2020-01-08 SURGERY — RIGHT HEART CATH
Anesthesia: LOCAL

## 2020-01-08 MED ORDER — HEPARIN (PORCINE) IN NACL 1000-0.9 UT/500ML-% IV SOLN
INTRAVENOUS | Status: AC
  Filled 2020-01-08: qty 500

## 2020-01-08 MED ORDER — SODIUM CHLORIDE 0.9% FLUSH: 3.0000 mL | Freq: Two times a day (BID) | INTRAVENOUS | Status: DC

## 2020-01-08 MED ORDER — MIDAZOLAM HCL 2 MG/2ML IJ SOLN
INTRAMUSCULAR | Status: AC
  Filled 2020-01-08: qty 2

## 2020-01-08 MED ORDER — SODIUM CHLORIDE 0.9 % IV SOLN: 250.0000 mL | INTRAVENOUS | Status: DC | PRN

## 2020-01-08 MED ORDER — SODIUM CHLORIDE 0.9% FLUSH: 3.0000 mL | INTRAVENOUS | Status: DC | PRN

## 2020-01-08 MED ORDER — LABETALOL HCL 5 MG/ML IV SOLN: 10.0000 mg | INTRAVENOUS | Status: DC | PRN

## 2020-01-08 MED ORDER — HYDRALAZINE HCL 20 MG/ML IJ SOLN: 10.0000 mg | INTRAMUSCULAR | Status: DC | PRN

## 2020-01-08 MED ORDER — HEPARIN (PORCINE) IN NACL 1000-0.9 UT/500ML-% IV SOLN
INTRAVENOUS | Status: DC | PRN
  Administered 2020-01-08: 500 mL

## 2020-01-08 MED ORDER — LIDOCAINE HCL (PF) 1 % IJ SOLN
INTRAMUSCULAR | Status: AC
  Filled 2020-01-08: qty 30

## 2020-01-08 MED ORDER — LIDOCAINE HCL (PF) 1 % IJ SOLN
INTRAMUSCULAR | Status: DC | PRN
  Administered 2020-01-08: 2 mL

## 2020-01-08 MED ORDER — ACETAMINOPHEN 325 MG PO TABS: 650.0000 mg | ORAL_TABLET | ORAL | Status: DC | PRN

## 2020-01-08 MED ORDER — FENTANYL CITRATE (PF) 100 MCG/2ML IJ SOLN
INTRAMUSCULAR | Status: AC
  Filled 2020-01-08: qty 2

## 2020-01-08 MED ORDER — ONDANSETRON HCL 4 MG/2ML IJ SOLN: 4.0000 mg | Freq: Four times a day (QID) | INTRAMUSCULAR | Status: DC | PRN

## 2020-01-08 MED ORDER — SODIUM CHLORIDE 0.9 % IV SOLN: INTRAVENOUS | Status: DC

## 2020-01-08 MED ORDER — MIDAZOLAM HCL 2 MG/2ML IJ SOLN
INTRAMUSCULAR | Status: DC | PRN
  Administered 2020-01-08: 1 mg via INTRAVENOUS

## 2020-01-08 MED ORDER — FENTANYL CITRATE (PF) 100 MCG/2ML IJ SOLN
INTRAMUSCULAR | Status: DC | PRN
  Administered 2020-01-08: 25 ug via INTRAVENOUS

## 2020-01-08 SURGICAL SUPPLY — 5 items
CATH SWAN GANZ 7F STRAIGHT (CATHETERS) ×1 IMPLANT
GLIDESHEATH SLENDER 7FR .021G (SHEATH) ×1 IMPLANT
KIT HEART LEFT (KITS) ×2 IMPLANT
PACK CARDIAC CATHETERIZATION (CUSTOM PROCEDURE TRAY) ×2 IMPLANT
TRANSDUCER W/STOPCOCK (MISCELLANEOUS) ×2 IMPLANT

## 2020-01-08 NOTE — Interval H&P Note (Signed)
History and Physical Interval Note:  01/08/2020 1:11 PM  Christian Bowman  has presented today for surgery, with the diagnosis of heart failure.  The various methods of treatment have been discussed with the patient and family. After consideration of risks, benefits and other options for treatment, the patient has consented to  Procedure(s): RIGHT HEART CATH (N/A) as a surgical intervention.  The patient's history has been reviewed, patient examined, no change in status, stable for surgery.  I have reviewed the patient's chart and labs.  Questions were answered to the patient's satisfaction.     Laddie Math [employer]

## 2020-01-08 NOTE — Discharge Instructions (Signed)
Brachial Site Care   This sheet gives you information about how to care for yourself after your procedure. Your health care provider may also give you more specific instructions. If you have problems or questions, contact your health care provider. What can I expect after the procedure? After the procedure, it is common to have:  Bruising and tenderness at the catheter insertion area. Follow these instructions at home: Medicines  Take over-the-counter and prescription medicines only as told by your health care provider. Insertion site care 1. Follow instructions from your health care provider about how to take care of your insertion site. Make sure you: ? Wash your hands with soap and water before you change your bandage (dressing). If soap and water are not available, use hand sanitizer. ? Remove your dressing as told by your health care provider. In 24 hours 2. Check your insertion site every day for signs of infection. Check for: ? Redness, swelling, or pain. ? Fluid or blood. ? Pus or a bad smell. ? Warmth. 3. Do not take baths, swim, or use a hot tub until your health care provider approves. 4. You may shower 24-48 hours after the procedure, or as directed by your health care provider. ? Remove the dressing and gently wash the site with plain soap and water. ? Pat the area dry with a clean towel. ? Do not rub the site. That could cause bleeding. 5. Do not apply powder or lotion to the site. Activity   1. For 24 hours after the procedure, or as directed by your health care provider: ? Do not push or pull heavy objects with the affected arm. ? Do not drive yourself home from the hospital or clinic. You may drive 24 hours after the procedure unless your health care provider tells you not to. ? Do not operate machinery or power tools. 2. Do not lift anything that is heavier than 10 lb (4.5 kg), or the limit that you are told, until your health care provider says that it is safe.   For 2 days 3. Ask your health care provider when it is okay to: ? Return to work or school. ? Resume usual physical activities or sports. ? Resume sexual activity. General instructions  If the catheter site starts to bleed, raise your arm and put firm pressure on the site.   If you went home on the same day as your procedure, a responsible adult should be with you for the first 24 hours after you arrive home.  Keep all follow-up visits as told by your health care provider. This is important. Contact a health care provider if:  You have a fever.  You have redness, swelling, or yellow drainage around your insertion site. Get help right away if:  You have unusual pain at the brachial site.  The catheter insertion area swells very fast.  The insertion area is bleeding, and the bleeding does not stop when you hold steady pressure on the area. These symptoms may represent a serious problem that is an emergency. Do not wait to see if the symptoms will go away. Get medical help right away. Call your local emergency services (911 in the [place] Do not drive yourself to the hospital. Summary  After the procedure, it is common to have bruising and tenderness at the site.  Follow instructions from your health care provider about how to take care of your brachial site wound. Check the wound every day for signs of infection.  Do not  lift anything that is heavier than 10 lb (4.5 kg), or the limit that you are told, until your health care provider says that it is safe. This information is not intended to replace advice given to you by your health care provider. Make sure you discuss any questions you have with your health care provider. Document Revised: 10/24/2017 Document Reviewed: 10/24/2017 Elsevier Patient Education  2020 [employer].

## 2020-01-12 ENCOUNTER — Telehealth (HOSPITAL_COMMUNITY): Payer: Self-pay | Admitting: *Deleted

## 2020-01-12 NOTE — Telephone Encounter (Signed)
Called patient per Dr. Aundra Dubin and left message. Dr. Aundra Dubin has spoken with CT surgeon, Dr. Murvin Natal re: possible LVAD.  Dr. Orvan Seen office will be reaching out to Mr. Barra to schedule the appointment. I asked Mr. Baller to call me if he has any questions.   Zada Girt RN, [facility] Coordinator [phone]

## 2020-01-19 ENCOUNTER — Other Ambulatory Visit: Payer: Self-pay

## 2020-01-19 ENCOUNTER — Institutional Professional Consult (permissible substitution) (INDEPENDENT_AMBULATORY_CARE_PROVIDER_SITE_OTHER): Payer: Medicare Other | Admitting: Cardiothoracic Surgery

## 2020-01-19 VITALS — BP 150/87 | HR 79 | Temp 97.7°F | Resp 20 | Ht 72.0 in | Wt 200.0 lb

## 2020-01-19 DIAGNOSIS — I509 Heart failure, unspecified: Secondary | ICD-10-CM | POA: Diagnosis not present

## 2020-01-20 NOTE — Progress Notes (Signed)
[address]       [place] [address]             [facility] REPORT  Referring Provider is Larey Dresser, MD Primary Cardiologist is Loralie Champagne, MD PCP is Sharilyn Sites, MD  Chief Complaint  Patient presents with  . Congestive Heart Failure    Surgical eval, Cardiac Cath 01/08/20, Stress Test 12/15/19, ECHO 12/01/19    HPI:  74 year old gentleman is referred for discussion regarding long-term LVAD therapy.  His cardiac history dates back to at least 2007 when he underwent coronary artery bypass grafting.  He then underwent a redo operation in 2017 which involved CABG and mitral valve replacement.  He recalls this was particularly difficult to get through.  Since the surgery he has been followed by Dr. Aundra Dubin for repeated episodes of heart failure.  He has had atrial flutter and underwent CRT.  More recently he had a TIA which was evaluated in [place].  Baseline ejection fraction is approximately 20%;  he underwent right heart catheterization earlier this month which demonstrated acceptable hemodynamics.  He is now referred for consideration of LVAD having exhausted most conventional therapies to treat his heart failure.  The patient remains active.  He enjoys gun shooting and other outdoor activities but does not participate in water sports. Past Medical History:  Diagnosis Date  . AICD (automatic cardioverter/defibrillator) present    medtronic-   DR. CROITORU , DR. Aundra Dubin   . Anginal pain ([place])    cp sat 08/11/17  . Anxiety   . CAD (coronary artery disease)   . CHF (congestive heart failure) ([place])   . Complication of anesthesia    took awhile to wake up   . Coronary artery disease involving coronary bypass graft   . Cyst of neck    right side  . DM2 (diabetes mellitus, type 2) ([place]) 08/26/2013  . Dyspnea   . Heart attack ([place])    "not sure when" (08/20/2017)  . HTN (hypertension) 08/26/2013  .  Hyperlipidemia 08/26/2013  . Hypothyroidism   . Left main coronary artery disease   . Left renal artery stenosis ([place])    Genesis 6x12 stent 2007  . Obesity (BMI 30.0-34.9) 08/26/2013  . Postoperative atrial fibrillation ([place]) 10/15/2005  . Presence of permanent cardiac pacemaker   . S/P CABG x 4 10/13/2005   LIMA to LAD, SVG to intermediate branch, sequential SVG to PDA and RPL branch, EVH via right thigh  . S/P mitral valve replacement with bioprosthetic valve 07/11/2016   31 mm [facility] Mitral bovine bioprosthetic tissue valve  . S/P redo CABG x 2 07/11/2016   SVG to PDA and SVG to Intermediate Branch, EVH via left thigh    Past Surgical History:  Procedure Laterality Date  . A-FLUTTER ABLATION N/A 05/11/2017   Procedure: A-Flutter Ablation;  Surgeon: Constance Haw, MD;  Location: [place] [facility];  Service: Cardiovascular;  Laterality: N/A;  . CARDIAC CATHETERIZATION N/A 06/21/2016   Procedure: Right/Left Heart Cath and Coronary/Graft Angiography;  Surgeon: Sherren Mocha, MD;  Location: [place] [facility];  Service: Cardiovascular;  Laterality: N/A;  . CARDIAC VALVE REPLACEMENT    . CARDIOVERSION N/A 07/19/2016   Procedure: CARDIOVERSION;  Surgeon: Lelon Perla, MD;  Location: [facility] ENDOSCOPY;  Service: Cardiovascular;  Laterality: N/A;  . CARDIOVERSION N/A 09/08/2016   Procedure: CARDIOVERSION;  Surgeon: Fay Records, MD;  Location: [facility]  ENDOSCOPY;  Service: Cardiovascular;  Laterality: N/A;  . CORONARY ANGIOPLASTY     STENT 2016  [place]     DES in SVG to right coronary artery system  . CORONARY ARTERY BYPASS GRAFT  10/13/2005   LIMA to LAD, SVG to intermediate branch, sequential SVG to PDA and RPL  . CORONARY ARTERY BYPASS GRAFT N/A 07/11/2016   Procedure: REDO CORONARY ARTERY BYPASS GRAFTING (CABG) x two using left leg greater saphenous vein harvested endoscopically-SVG to PDA -SVG to RAMUS INTERMEDIATE;   Surgeon: Rexene Alberts, MD;  Location: [facility];  Service: Open Heart Surgery;  Laterality: N/A;  . CORONARY ARTERY BYPASS GRAFT N/A 07/11/2016   Procedure: Re-exploration (CABG) for post op bleeding,;  Surgeon: Rexene Alberts, MD;  Location: [place];  Service: Open Heart Surgery;  Laterality: N/A;  . EP IMPLANTABLE DEVICE N/A 07/25/2016   Procedure: BiV ICD Insertion CRT-D;  Surgeon: Will Meredith Leeds, MD;  Location: [place] [facility];  Service: Cardiovascular;  Laterality: N/A;  . INGUINAL HERNIA REPAIR Left 08/20/2017  . INGUINAL HERNIA REPAIR Left 08/20/2017   Procedure: OPEN LEFT INGUINAL HERNIA REPAIR;  Surgeon: Alphonsa Overall, MD;  Location: [place];  Service: General;  Laterality: Left;  . LEFT HEART CATH AND CORS/GRAFTS ANGIOGRAPHY N/A 12/18/2016   Procedure: Left Heart Cath and Cors/Grafts Angiography;  Surgeon: Sherren Mocha, MD;  Location: [place] [facility];  Service: Cardiovascular;  Laterality: N/A;  . LEFT HEART CATH AND CORS/GRAFTS ANGIOGRAPHY N/A 08/22/2017   Procedure: LEFT HEART CATH AND CORS/GRAFTS ANGIOGRAPHY;  Surgeon: Larey Dresser, MD;  Location: [place] [facility];  Service: Cardiovascular;  Laterality: N/A;  . MITRAL VALVE REPLACEMENT N/A 07/11/2016   Procedure: MITRAL VALVE (MV) REPLACEMENT;  Surgeon: Rexene Alberts, MD;  Location: [place];  Service: Open Heart Surgery;  Laterality: N/A;  . MYOCARDICAL PERFUSION  10/09/2007   NORMAL PERFUSION IN ALL REGIONS;NO EVIDENCE OF INDUCIBLE ISCHEMIA;POST STRESS EF% 66  . RENAL ARTERY STENT Right 2007  . RENAL DOPPLER  03/28/2010   RIGHT RA-NORMAL;LEFT PROXIMAL RA AT STENT-PATENT WITH NO EVIDENCE OF SIGN DIAMETER REDUCTION. R & L KIDNEYS: EQUAL IN SIZE,SYMMETRICAL IN SHAPE.  [name] RIGHT HEART CATH N/A 01/08/2020   Procedure: RIGHT HEART CATH;  Surgeon: Larey Dresser, MD;  Location: [place] [facility];  Service: Cardiovascular;  Laterality: N/A;  . TEE WITHOUT CARDIOVERSION N/A 06/15/2016   Procedure: TRANSESOPHAGEAL ECHOCARDIOGRAM  (TEE);  Surgeon: Sanda Klein, MD;  Location: [facility] ENDOSCOPY;  Service: Cardiovascular;  Laterality: N/A;  . TEE WITHOUT CARDIOVERSION N/A 07/11/2016   Procedure: TRANSESOPHAGEAL ECHOCARDIOGRAM (TEE);  Surgeon: Rexene Alberts, MD;  Location: [place];  Service: Open Heart Surgery;  Laterality: N/A;  . TEE WITHOUT CARDIOVERSION N/A 07/19/2016   Procedure: TRANSESOPHAGEAL ECHOCARDIOGRAM (TEE);  Surgeon: Lelon Perla, MD;  Location: [facility] ENDOSCOPY;  Service: Cardiovascular;  Laterality: N/A;  . TRANSESOPHAGEAL ECHOCARDIOGRAM  10/19/2005   NORMAL LV; MILD TO MODERATE AMOUNT OF SOFT ATHEROMATOUS PLAQUE OF THE THORACIC AORTA; THE LEFT ATRIUM IS MILDLY DILATED;LEFT ATRIAL APPENDAGE FUNCTION IS NORMAL;NO THROMBUS IDENTIFIED. SMALL PFO WITH RIGHT TO LEFT SHUNT    Family History  Problem Relation Age of Onset  . Heart attack Mother   . Heart disease Mother   . Heart attack Father   . Heart disease Father   . Hypertension Father   . Hypertension Sister   . Heart disease Maternal Grandmother     Social History  Socioeconomic History  . Marital status: Married    Spouse name: Not on file  . Number of children: 0  . Years of education: 77  . Highest education level: Not on file  Occupational History  . Occupation: retired  Tobacco Use  . Smoking status: Never Smoker  . Smokeless tobacco: Never Used  Substance and Sexual Activity  . Alcohol use: No  . Drug use: No  . Sexual activity: Never  Other Topics Concern  . Not on file  Social History Narrative   Right handed   Two story home   Drinks half and half coffee   Social Determinants of Health   Financial Resource Strain:   . Difficulty of Paying Living Expenses:   Food Insecurity:   . Worried About Charity fundraiser in the Last Year:   . Arboriculturist in the Last Year:   Transportation Needs:   . Film/video editor (Medical):   [name] Lack of Transportation (Non-Medical):   Physical Activity:   . Days of Exercise per Week:    . Minutes of Exercise per Session:   Stress:   . Feeling of Stress :   Social Connections:   . Frequency of Communication with Friends and Family:   . Frequency of Social Gatherings with Friends and Family:   . Attends Religious Services:   . Active Member of Clubs or Organizations:   . Attends Archivist Meetings:   [name] Marital Status:   Intimate Partner Violence:   . Fear of Current or Ex-Partner:   . Emotionally Abused:   [name] Physically Abused:   . Sexually Abused:     Current Outpatient Medications  Medication Sig Dispense Refill  . amoxicillin (AMOXIL) 500 MG tablet Take 4 tablets (2,000 mg total) by mouth as needed. Pre-dental work only. (Patient taking differently: Take 2,000 mg by mouth See admin instructions. Take 2000 mg 1 hour prior to dental work) 4 tablet 3  . aspirin EC 81 MG tablet Take 81 mg by mouth daily.     . carvedilol (COREG) 12.5 MG tablet Take 1.5 tablets (18.75 mg total) by mouth 2 (two) times daily. 180 tablet 3  . cholecalciferol (VITAMIN D3) 25 MCG (1000 UNIT) tablet Take 1,000 Units by mouth 2 (two) times a week.     . clopidogrel (PLAVIX) 75 MG tablet Take 75 mg by mouth daily.    . digoxin (LANOXIN) 0.125 MG tablet Take 0.5 tablets (0.0625 mg total) by mouth daily. 15 tablet 3  . ENTRESTO 49-51 MG Take 1 tablet by mouth twice daily (Patient taking differently: Take 1 tablet by mouth in the morning and at bedtime. ) 180 tablet 3  . eplerenone (INSPRA) 25 MG tablet Take 1 tablet (25 mg total) by mouth daily. 30 tablet 6  . furosemide (LASIX) 20 MG tablet Take 1 tablet (20 mg total) by mouth daily as needed. (Patient taking differently: Take 20 mg by mouth daily as needed for fluid. ) 15 tablet 6  . isosorbide mononitrate (IMDUR) 30 MG 24 hr tablet Take 1 tablet (30 mg total) by mouth as needed. (Patient taking differently: Take 30 mg by mouth daily as needed (anxiety). ) 45 tablet 2  . levothyroxine (SYNTHROID) 50 MCG tablet Take 1 tablet (50 mcg  total) by mouth daily before breakfast. 30 tablet 3  . linagliptin (TRADJENTA) 5 MG TABS tablet Take 1 tablet (5 mg total) by mouth daily. 90 tablet 1  . Melatonin 1 MG  CAPS Take 2 mg by mouth daily as needed (sleep).     . neomycin-bacitracin-polymyxin (NEOSPORIN) ointment Apply 1 application topically daily as needed for wound care.    . nitroGLYCERIN (NITROSTAT) 0.4 MG SL tablet DISSOLVE ONE TABLET UNDER THE TONGUE EVERY 5 MINUTES AS NEEDED FOR CHEST PAIN.  DO NOT EXCEED A TOTAL OF 3 DOSES IN 15 MINUTES (Patient taking differently: Place 0.4 mg under the tongue every 5 (five) minutes as needed for chest pain. DO NOT EXCEED A TOTAL OF 3 DOSES IN 15 MINUTES) 25 tablet 6  . polyethylene glycol (MIRALAX / GLYCOLAX) packet Take 17 g by mouth daily as needed for mild constipation or moderate constipation.    . rosuvastatin (CRESTOR) 10 MG tablet TAKE 1 TABLET BY MOUTH ONCE DAILY IN THE EVENING (Patient taking differently: Take 10 mg by mouth every evening. ) 90 tablet 0  . sodium zirconium cyclosilicate (LOKELMA) 10 g PACK packet Take 10 g by mouth daily. 90 each 0  . Tetrahydrozoline HCl (VISINE OP) Place 1 drop into both eyes daily as needed (irritation).     No current facility-administered medications for this visit.    Allergies  Allergen Reactions  . Xanax [Alprazolam] Other (See Comments)    Pt feels very weak, tired and feels paralyzed        Review of Systems:   General:  Decreased energy, stable weight   Cardiac:  Denies chest pain with exertion/at rest, positive SOB with exertion and at rest; positive orthopnea,   Respiratory:  Positive shortness of breath, positive for productive cough, and dry cough  GI:   + For difficulty swallowing,   GU:   negative  Vascular:  negative  Neuro:   H/o recent stroke/tia   Musculoskeletal: negative   Skin:   negative  Psych:   + anxiety and unusual recent stress  Eyes:   negative  ENT:   negative  Hematologic:  negative  Endocrine:  +  diabetes, does not check CBG's at home     Physical Exam:   BP (!) 150/87   Pulse 79   Temp 97.7 F (36.5 C) (Skin)   Resp 20   Ht 6' (1.829 m)   Wt 90.7 kg   SpO2 97% Comment: RA  BMI 27.12 kg/m   General:   well-appearing  HEENT:  Unremarkable   Neck:   no JVD, no bruits, no adenopathy  Chest:   clear to auscultation, symmetrical breath sounds, no wheezes, no rhonchi   CV:   RRR, no  murmur   Abdomen:  soft, non-tender, no masses   Extremities:  warm, well-perfused, pulses 2+, no LE edema  Rectal/GU  Deferred  Neuro:   Grossly non-focal and symmetrical throughout  Skin:   Clean and dry, no rashes, no breakdown   Diagnostic Tests:  I have reviewed his available imaging studies but there is no recent CT scan of the chest that would advised on the difficulty of a repeat chest operation   Impression:  74 year old gentleman with a longstanding ischemic cardiomyopathy status post 2 prior surgeries.  He is understandably quite hesitant to undergo a third operation.  I have explained some of the nuances of LVAD therapy including driveline and need for battery support around-the-clock.   Plan:  The patient will consider our discussion and follow-up with Dr. Aundra Dubin in the near future   I spent in excess of 30 minutes during the conduct of this office consultation and >50% of this time involved  direct face-to-face encounter with the patient for counseling and/or coordination of their care.          Level 3 Office Consult = 40 minutes         Level 4 Office Consult = 60 minutes         Level 5 Office Consult = 80 minutes  B.  Murvin Natal, MD 01/20/2020 7:12 PM

## 2020-01-26 ENCOUNTER — Ambulatory Visit (HOSPITAL_COMMUNITY)
Admission: RE | Admit: 2020-01-26 | Discharge: 2020-01-26 | Disposition: A | Discharge status: Home / Self Care | Payer: Medicare Other | Source: Ambulatory Visit | Attending: Cardiology | Admitting: Cardiology

## 2020-01-26 ENCOUNTER — Encounter (HOSPITAL_COMMUNITY): Payer: Self-pay | Admitting: Cardiology

## 2020-01-26 ENCOUNTER — Ambulatory Visit: Payer: Medicare Other | Admitting: "Endocrinology

## 2020-01-26 ENCOUNTER — Other Ambulatory Visit: Payer: Self-pay

## 2020-01-26 VITALS — BP 134/80 | HR 82 | Wt 202.4 lb

## 2020-01-26 DIAGNOSIS — Z79899 Other long term (current) drug therapy: Secondary | ICD-10-CM | POA: Diagnosis not present

## 2020-01-26 DIAGNOSIS — N183 Chronic kidney disease, stage 3 unspecified: Secondary | ICD-10-CM | POA: Insufficient documentation

## 2020-01-26 DIAGNOSIS — I25810 Atherosclerosis of coronary artery bypass graft(s) without angina pectoris: Secondary | ICD-10-CM | POA: Insufficient documentation

## 2020-01-26 DIAGNOSIS — I442 Atrioventricular block, complete: Secondary | ICD-10-CM | POA: Insufficient documentation

## 2020-01-26 DIAGNOSIS — Z8249 Family history of ischemic heart disease and other diseases of the circulatory system: Secondary | ICD-10-CM | POA: Diagnosis not present

## 2020-01-26 DIAGNOSIS — Z79890 Hormone replacement therapy (postmenopausal): Secondary | ICD-10-CM | POA: Diagnosis not present

## 2020-01-26 DIAGNOSIS — E785 Hyperlipidemia, unspecified: Secondary | ICD-10-CM | POA: Insufficient documentation

## 2020-01-26 DIAGNOSIS — Z888 Allergy status to other drugs, medicaments and biological substances status: Secondary | ICD-10-CM | POA: Diagnosis not present

## 2020-01-26 DIAGNOSIS — Z7984 Long term (current) use of oral hypoglycemic drugs: Secondary | ICD-10-CM | POA: Diagnosis not present

## 2020-01-26 DIAGNOSIS — I4892 Unspecified atrial flutter: Secondary | ICD-10-CM | POA: Insufficient documentation

## 2020-01-26 DIAGNOSIS — Z8673 Personal history of transient ischemic attack (TIA), and cerebral infarction without residual deficits: Secondary | ICD-10-CM | POA: Insufficient documentation

## 2020-01-26 DIAGNOSIS — Z953 Presence of xenogenic heart valve: Secondary | ICD-10-CM | POA: Diagnosis not present

## 2020-01-26 DIAGNOSIS — I5022 Chronic systolic (congestive) heart failure: Secondary | ICD-10-CM | POA: Insufficient documentation

## 2020-01-26 DIAGNOSIS — Z7982 Long term (current) use of aspirin: Secondary | ICD-10-CM | POA: Insufficient documentation

## 2020-01-26 DIAGNOSIS — I252 Old myocardial infarction: Secondary | ICD-10-CM | POA: Diagnosis not present

## 2020-01-26 DIAGNOSIS — Z95810 Presence of automatic (implantable) cardiac defibrillator: Secondary | ICD-10-CM | POA: Diagnosis not present

## 2020-01-26 DIAGNOSIS — I25708 Atherosclerosis of coronary artery bypass graft(s), unspecified, with other forms of angina pectoris: Secondary | ICD-10-CM | POA: Diagnosis not present

## 2020-01-26 DIAGNOSIS — I255 Ischemic cardiomyopathy: Secondary | ICD-10-CM | POA: Insufficient documentation

## 2020-01-26 DIAGNOSIS — Z951 Presence of aortocoronary bypass graft: Secondary | ICD-10-CM | POA: Insufficient documentation

## 2020-01-26 DIAGNOSIS — D170 Benign lipomatous neoplasm of skin and subcutaneous tissue of head, face and neck: Secondary | ICD-10-CM | POA: Diagnosis not present

## 2020-01-26 DIAGNOSIS — E039 Hypothyroidism, unspecified: Secondary | ICD-10-CM | POA: Insufficient documentation

## 2020-01-26 DIAGNOSIS — Z7902 Long term (current) use of antithrombotics/antiplatelets: Secondary | ICD-10-CM | POA: Insufficient documentation

## 2020-01-26 DIAGNOSIS — E1122 Type 2 diabetes mellitus with diabetic chronic kidney disease: Secondary | ICD-10-CM | POA: Diagnosis not present

## 2020-01-26 LAB — BASIC METABOLIC PANEL
Anion gap: 9 (ref 5–15)
BUN: 24 mg/dL — ABNORMAL HIGH (ref 8–23)
CO2: 22 mmol/L (ref 22–32)
Calcium: 9.1 mg/dL (ref 8.9–10.3)
Chloride: 106 mmol/L (ref 98–111)
Creatinine, Ser: 1.29 mg/dL — ABNORMAL HIGH (ref 0.61–1.24)
GFR calc Af Amer: >60 mL/min (ref >60)
GFR calc non Af Amer: 55 mL/min — ABNORMAL LOW (ref >60)
Glucose, Bld: 120 mg/dL — ABNORMAL HIGH (ref 70–99)
Potassium: 4.5 mmol/L (ref 3.5–5.1)
Sodium: 137 mmol/L (ref 135–145)

## 2020-01-26 LAB — CUP PACEART REMOTE DEVICE CHECK
Battery Remaining Longevity: 33 mo
Battery Voltage: 2.95 V
Brady Statistic AP VP Percent: 54.08 %
Brady Statistic AP VS Percent: 0.86 %
Brady Statistic AS VP Percent: 44.17 %
Brady Statistic AS VS Percent: 0.9 %
Brady Statistic RA Percent Paced: 54.13 %
Brady Statistic RV Percent Paced: 95.55 %
Date Time Interrogation Session: 20210426115141
HighPow Impedance: 61 Ohm
Implantable Lead Implant Date: 20171024
Implantable Lead Implant Date: 20171024
Implantable Lead Implant Date: 20171024
Implantable Lead Location: 753858
Implantable Lead Location: 753859
Implantable Lead Location: 753860
Implantable Lead Model: 4598
Implantable Lead Model: 5076
Implantable Pulse Generator Implant Date: 20171024
Lead Channel Impedance Value: 228 Ohm
Lead Channel Impedance Value: 228 Ohm
Lead Channel Impedance Value: 228 Ohm
Lead Channel Impedance Value: 232.653
Lead Channel Impedance Value: 232.653
Lead Channel Impedance Value: 304 Ohm
Lead Channel Impedance Value: 399 Ohm
Lead Channel Impedance Value: 418 Ohm
Lead Channel Impedance Value: 456 Ohm
Lead Channel Impedance Value: 456 Ohm
Lead Channel Impedance Value: 456 Ohm
Lead Channel Impedance Value: 475 Ohm
Lead Channel Impedance Value: 551 Ohm
Lead Channel Impedance Value: 779 Ohm
Lead Channel Impedance Value: 779 Ohm
Lead Channel Impedance Value: 779 Ohm
Lead Channel Impedance Value: 779 Ohm
Lead Channel Impedance Value: 817 Ohm
Lead Channel Pacing Threshold Amplitude: 0.75 V
Lead Channel Pacing Threshold Amplitude: 0.75 V
Lead Channel Pacing Threshold Amplitude: 0.875 V
Lead Channel Pacing Threshold Pulse Width: 0.4 ms
Lead Channel Pacing Threshold Pulse Width: 0.4 ms
Lead Channel Pacing Threshold Pulse Width: 1 ms
Lead Channel Sensing Intrinsic Amplitude: 1.375 mV
Lead Channel Sensing Intrinsic Amplitude: 1.375 mV
Lead Channel Sensing Intrinsic Amplitude: 15.5 mV
Lead Channel Sensing Intrinsic Amplitude: 15.5 mV
Lead Channel Setting Pacing Amplitude: 1.25 V
Lead Channel Setting Pacing Amplitude: 2 V
Lead Channel Setting Pacing Amplitude: 2 V
Lead Channel Setting Pacing Pulse Width: 0.4 ms
Lead Channel Setting Pacing Pulse Width: 1 ms
Lead Channel Setting Sensing Sensitivity: 0.3 mV

## 2020-01-26 LAB — T4, FREE: Free T4: 1.15 ng/dL — ABNORMAL HIGH (ref 0.61–1.12)

## 2020-01-26 LAB — TSH: TSH: 3.643 u[IU]/mL (ref 0.350–4.500)

## 2020-01-26 LAB — DIGOXIN LEVEL: Digoxin Level: 0.4 ng/mL — ABNORMAL LOW (ref 0.8–2.0)

## 2020-01-26 MED ORDER — LOKELMA 10 G PO PACK: 10.0000 g | PACK | Freq: Every day | ORAL | 3 refills | Status: DC

## 2020-01-26 MED ORDER — EPLERENONE 50 MG PO TABS: 50.0000 mg | ORAL_TABLET | Freq: Every day | ORAL | 4 refills | Status: DC

## 2020-01-26 MED ORDER — AMOXICILLIN 500 MG PO TABS: 2000.0000 mg | ORAL_TABLET | ORAL | 0 refills | Status: DC

## 2020-01-26 MED ORDER — CLOPIDOGREL BISULFATE 75 MG PO TABS: 75.0000 mg | ORAL_TABLET | Freq: Every day | ORAL | 3 refills | Status: DC

## 2020-01-26 NOTE — Patient Instructions (Addendum)
INCREASE Eplerenone 50mg  (1 tab) daily  Labs today and repeat in 10 days We will only contact you if something comes back abnormal or we need to make some changes. Otherwise no news is good news!  Your physician recommends that you schedule a follow-up appointment in: 6 weeks with Dr Aundra Dubin  Next appointments:  Lab Thursday, May 6th, 2021 at 12:30p [place] Code: [id number]  Dr Aundra Dubin Tuesday, June 8th 2021 at 3:20pm [place] Code: [id number]   Please call office at [phone] option 2 if you have any questions or concerns.   At the [facility], you and your health needs are our priority. As part of our continuing mission to provide you with exceptional heart care, we have created designated Provider Care Teams. These Care Teams include your primary Cardiologist (physician) and Advanced Practice Providers (APPs- Physician Assistants and Nurse Practitioners) who all work together to provide you with the care you need, when you need it.   You may see any of the following providers on your designated Care Team at your next follow up: [name] Dr Glori Bickers . Dr Loralie Champagne . Darrick Grinder, NP . Lyda Jester, PA . Audry Riles, PharmD   Please be sure to bring in all your medications bottles to every appointment.

## 2020-01-26 NOTE — Progress Notes (Signed)
Date:  01/26/2020   ID:  Christian Bowman, DOB 11/26/1945, MRN [id number]   Provider location: [facility] Type of Visit: Established patient   PCP:  Sharilyn Sites, MD  Cardiologist: Dr. Sallyanne Kuster  HF Cardiologist:  Loralie Champagne, MD   History of Present Illness: Christian Bowman is a 74 y.o. male with history of CAD s/p CABG in 2007 then redo CABG in 10/17 with mitral valve replacement.  He was admitted in 10/17 for redo CABG with SVG-PDA and SVG-ramus.  He also had mitral valve replacement with a bioprosthetic valve because of infarct-related mitral regurgitation.  Post-operative course was complicated by CHF requiring diuresis.  He also had atrial flutter and required DCCV.  Due to complete heart block, he later got a CRT-D system.    At a prior visit, he was in atrial flutter.  He saw Dr. Curt Bears, it was decided to arrange for DCCV with ablation down the road when PPM leads have been in longer.  He had successful DCCV in 12/17 and is in NSR today.   He was admitted in 3/18 with NSTEMI and chest pain. TnI only 0.5.  LHC showed occlusion of SVG-PDA from CABG#1 to be the likely culprit.  However, SVG-PDA from CABG#2 was patent.  No intervention.  Echo in 3/18 from [place] showed EF 30-35%, stable bioprosthetic mitral valve.   He had atrial flutter ablation in 8/18.  He is in NSR today and is off amiodarone.    In 11/18, he had an inguinal hernia repair. Post-op, he had an NSTEMI.  LHC showed occlusion of a PLV branch that had been backfilled by SVG-PDA (prior cath had shown severe diffuse disease in the PLV).  Echo in 11/18 showed EF 20-25% with normal bioprosthetic mitral valve.    With no recurrence of atrial arrhythmias, he is now off anticoagulation.   Echo in 7/20 showed EF 25-30%, bioprosthetic MV with no MR and mean gradient 5 mmHg, normal RV.    In 1/21, he developed right hand weakness and dysarthria concerning for CVA.  He went to the ER in [place] and  had tPA.  Symptoms resolved.  Atrial fibrillation was not noted per his wife's report.  CTA head/neck showed no significant carotid stenosis.  He was started on Plavix 75 mg daily and sent home.    CPX (3/21) showed severe functional impairment due to HF.  Echo showed EF 20-25%, normal RV, normally functioning bioprosthetic mitral valve.  RHC in 4/21 showed low filling pressures and low cardiac index (2.1 Fick, 1.9 thermo).  He saw Dr. Orvan Seen to discuss possible lateral thoracotomy access LVAD.   He returns for followup of CHF.  He remains more dyspneic and fatigued than his prior baseline.  Weight is stable.  He is out of breath and tired after walking about 25 yards.  No orthopnea/PND.  No chest pain.  No lightheadedness.  No palpitations.     Medtronic device interrogation: 95% BiV pacing, thoracic impedance stable.   ECG (personally reviewed): A-BiV sequential pacing  Labs (11/17): K 4.8, creatinine 1.89, hgb 12.3, digoxin 0.9, LFTs normal, TSH 8.235 (mild increase), free T3 low, free T4 normal, LFTs normal.  Labs (12/17): K 4.5, creatinine 1.7, BNP 3909 Labs (3/18): K 3.9, creatinine 1.48, hgb 11.3 Labs (4/18): digoxin 0.5, LFTs normal Labs (7/18): K 5.1, creatinine 1.78, LDL 63, HDL 29, TSH elevated Labs (11/18): K 4.5, creatinine 1.86, hgb 13.6 Labs (12/18): K 5, creatinine 1.88  Labs (1/19): digoxin 0.3, K 4.7, creatinine 1.83 Labs (6/19): LDL 70, HDL 32, K 5.1, creatinine 1.88 Labs (12/19): LDL 60, HDL 31 Labs (1/20): K 5.1, creatinine 1.64 Labs (4/20): K 4.5, creatinine 1.77 Labs (8/20): K 4.6, creatinine 1.5 Labs (1/21): K 4.5, creatinine 1.58, LDL 69 Labs (2/21): K 4.5, creatinine 1.66 Labs (4/21): K 4.1, creatinine 1.63  PMH: 1. CAD: CABG 2007.  - LHC (9/17) with patent LIMA-LAD, totally occluded SVG-D, severe stenosis in SVG-PDA.  - Redo CABG 10/17 with SVG-PDA, SVG-ramus and mitral valve replacement.  - NSTEMI 3/18.  LHC with culprit lesion likely occlusion of SVG-PDA  from CABG#1.  Patent SVG-PDA from CABG#2.  No intervention.  - NSTEMI 11/18 post-op inguinal hernia repair.  LHC with occlusion of a PLV branch that had been backfilled by SVG-PDA (prior cath had shown severe diffuse disease in the PLV).  2. Mitral regurgitation: Ischemic MR, mitral valve replacement was done in 10/17 (bioprosthetic). 3. Complete heart block: Post-op in 10/17.  Medtronic CRT-D placed.  4. Atrial flutter: DCCV 10/17 and again in 12/17.  - Ablation 8/18, now off amiodarone.  5. Type II diabetes 6. Hyperlipidemia 7. Chronic systolic CHF: Ischemic cardiomyopathy.   - TEE (10/17): EF 20-25%, severe LV dilation - Echo (3/18, [place]): EF 30-35%, bioprosthetic mitral valve with mean gradient 5 mmHg.  - Echo (11/18): EF 20-25%, moderate LV dilation, normal bioprosthetic mitral valve.  - Echo (7/20): EF 25-30%, moderate LV dilation, inferior/inferolateral AK, bioprosthetic MV with mean gradient 5 and no MR, normal RV.  - Painful gynecomastia with spironolactone.  - Echo (1/21): EF < 20%, normal RV size and systolic function, bioprosthetic mitral valve with mild MR, mean gradient 2 mmHg.  - CPX (3/21): peak VO2 11.2, VE/VCO2 slope 56, RER 1.21.  - Echo (3/21): EF 20-25%, no LV thrombus, RV normal, bioprosthetic mitral valve appear normal.  - RHC (4/21): mean RA 3, PA 25/7, mean PCWP 11, CI 2.1 (Fick), 1.9 (thermo).  8. Hypothyroidism 9. CVA 1/21: CTA head/neck with no carotid stenosis.   Current Outpatient Medications  Medication Sig Dispense Refill  . amoxicillin (AMOXIL) 500 MG tablet Take 4 tablets (2,000 mg total) by mouth See admin instructions. Take 2000 mg 1 hour prior to dental work 4 tablet 0  . aspirin EC 81 MG tablet Take 81 mg by mouth daily.     . carvedilol (COREG) 12.5 MG tablet Take 1.5 tablets (18.75 mg total) by mouth 2 (two) times daily. 180 tablet 3  . cholecalciferol (VITAMIN D3) 25 MCG (1000 UNIT) tablet Take 1,000 Units by mouth 2 (two) times a week.     .  clopidogrel (PLAVIX) 75 MG tablet Take 1 tablet (75 mg total) by mouth daily. 90 tablet 3  . digoxin (LANOXIN) 0.125 MG tablet Take 0.5 tablets (0.0625 mg total) by mouth daily. 15 tablet 3  . eplerenone (INSPRA) 50 MG tablet Take 1 tablet (50 mg total) by mouth daily. 30 tablet 4  . furosemide (LASIX) 20 MG tablet Take 20 mg by mouth daily as needed for fluid or edema.    . isosorbide mononitrate (IMDUR) 30 MG 24 hr tablet Take 30 mg by mouth daily as needed (anxiety).    [name] levothyroxine (SYNTHROID) 50 MCG tablet Take 1 tablet (50 mcg total) by mouth daily before breakfast. 30 tablet 3  . linagliptin (TRADJENTA) 5 MG TABS tablet Take 1 tablet (5 mg total) by mouth daily. 90 tablet 1  . Melatonin 1 MG CAPS Take 2  mg by mouth daily as needed (sleep).     . neomycin-bacitracin-polymyxin (NEOSPORIN) ointment Apply 1 application topically daily as needed for wound care.    . nitroGLYCERIN (NITROSTAT) 0.4 MG SL tablet Place 0.4 mg under the tongue every 5 (five) minutes as needed for chest pain.    . polyethylene glycol (MIRALAX / GLYCOLAX) packet Take 17 g by mouth daily as needed for mild constipation or moderate constipation.    . rosuvastatin (CRESTOR) 10 MG tablet Take 10 mg by mouth daily.    . sacubitril-valsartan (ENTRESTO) 49-51 MG Take 1 tablet by mouth 2 (two) times daily.    . sodium zirconium cyclosilicate (LOKELMA) 10 g PACK packet Take 10 g by mouth daily. 90 each 3  . Tetrahydrozoline HCl (VISINE OP) Place 1 drop into both eyes daily as needed (irritation).     No current facility-administered medications for this encounter.    Allergies:   Xanax [alprazolam]   Social History:  The patient  reports that he has never smoked. He has never used smokeless tobacco. He reports that he does not drink alcohol or use drugs.   Family History:  The patient's family history includes Heart attack in his father and mother; Heart disease in his father, maternal grandmother, and mother;  Hypertension in his father and sister.   ROS:  Please see the history of present illness.   All other systems are personally reviewed and negative.   Exam:   BP 134/80   Pulse 82   Wt 91.8 kg (202 lb 6.4 oz)   SpO2 98%   BMI 27.45 kg/m   General: NAD Neck: No JVD, no thyromegaly or thyroid nodule.  Lungs: Clear to auscultation bilaterally with normal respiratory effort. CV: Nondisplaced PMI.  Heart regular S1/S2, no S3/S4, no murmur.  No peripheral edema.  No carotid bruit.  Normal pedal pulses.  Abdomen: Soft, nontender, no hepatosplenomegaly, no distention.  Skin: Intact without lesions or rashes.  Neurologic: Alert and oriented x 3.  Psych: Normal affect. Extremities: No clubbing or cyanosis.  HEENT: Normal.   Recent Labs: 10/17/2019: ALT 15; B Natriuretic Peptide 4,254.3 12/25/2019: Platelets 151 01/08/2020: Hemoglobin 13.6; Hemoglobin 13.6 01/26/2020: BUN 24; Creatinine, Ser 1.29; Potassium 4.5; Sodium 137; TSH 3.643  Personally reviewed   Wt Readings from Last 3 Encounters:  01/26/20 91.8 kg (202 lb 6.4 oz)  01/19/20 90.7 kg (200 lb)  01/08/20 88 kg (194 lb)    ASSESSMENT AND PLAN:  1. Chronic systolic CHF: Ischemic cardiomyopathy.  TEE 10/17 with EF 20-25%.  Echo in 3/21 shoed EF 20-25%, normal RV function, normal bioprosthetic mitral valve.  CPX in 3/21 showed severe functional impairment due to HF with concern for poor short-term prognosis.  RHC in 4/21 showed normal filling pressures but low cardiac index.  Symptoms have been worse for the last few months, now NYHA class IIIb.  He is not volume overloaded on exam or by Optivol, so I am concerned that his symptoms derive from low output.    - Continue to use Lasix prn.    - Continue Entresto 49/51 bid.  Would not increase with history of orthostatic-type symptoms.  BMET today.  - Gynecomastia with spironolactone.  Increase eplerenone to 50 mg daily.  BMET 10 days.   - Continue Coreg 18.75 mg bid.    - He has been on  Lokelma chronically to control hyperkalemia and allow use of eplerenone and Entresto.  - His wife is concerned about using [place] and says  that his endocrinologist did not want him to start it.  I think it would be helpful with CKD and CHF, but to avoid confusion will not press the point.   - Continue digoxin 0.0625 daily.  Check digoxin level.  - RHC showed low cardiac output matching the poor CPX.  I have talked to him about advanced therapies given poor prognosis. He is not a transplant candidate.  He has had 2 prior sternotomies, which makes LVAD more complicated.  He talked with Dr Orvan Seen about LVAD approach from left lateral thoracotomy.  Today, we discussed LVAD extensively again.  He is very clear that he does not want another cardiac surgery.  - As he has turned down LVAD, we discussed barostimulation to help with his symptoms. I will screen him for the ANTHEM trial.  His right neck lipoma may pose a problem for carotid lead placement, however.  2. CAD: s/p redo CABG. Admission in 3/18 with NSTEMI, LHC showed occlusion of SVG-PDA from CABG#1 but patent SVG-PDA from CABG#2, no intervention.  He had a post-op NSTEMI after inguinal hernia repair in 11/18.  LHC showed occlusion of a PLV branch that had been backfilled by SVG-PDA (prior cath had shown severe diffuse disease in the PLV). No recent chest pain.     - Continue statin => Crestor 10 mg daily. Good lipids in 1/21.    - Continue ASA 81. - Avoid future elective surgery.  3. Bioprosthetic mitral valve: Stable 3/21 echo with no significant regurgitation or stenosis.   4. Atrial flutter: s/p ablation in 8/18.  He is in NSR and off amiodarone.  - Now off warfarin with flutter ablation and no recurrence. If he has recurrence of atrial arrhythmias, based on most recent data DOAC would be a reasonable choice for him even with bioprosthetic MV.  5. CKD: Stage III.  BMET.   6. Type II diabetes: Followed by endocrinology. 7. CVA: In 1/21, got tPA  at the ER in [place].  Symptoms resolved.  CTA head/neck showed no carotid stenosis.  Device interrogation has shown no atrial fibrillation. If atrial fibrillation is noted on future monitoring, will need anticoagulation.  - Continue ASA and Plavix 75 daily.  - Continue Crestor.   Restart cardiac rehab.   Signed, Loralie Champagne, MD  01/26/2020  [facility] [address] [facility] [place] [address] [phone] (office) [phone] (fax)

## 2020-01-27 ENCOUNTER — Ambulatory Visit (INDEPENDENT_AMBULATORY_CARE_PROVIDER_SITE_OTHER): Payer: Medicare Other | Admitting: *Deleted

## 2020-01-27 DIAGNOSIS — I5023 Acute on chronic systolic (congestive) heart failure: Secondary | ICD-10-CM

## 2020-01-28 NOTE — Progress Notes (Signed)
ICD Remote  

## 2020-02-04 ENCOUNTER — Other Ambulatory Visit: Payer: Self-pay

## 2020-02-04 ENCOUNTER — Encounter: Payer: Self-pay | Admitting: "Endocrinology

## 2020-02-04 ENCOUNTER — Ambulatory Visit (INDEPENDENT_AMBULATORY_CARE_PROVIDER_SITE_OTHER): Payer: Medicare Other | Admitting: "Endocrinology

## 2020-02-04 VITALS — BP 127/72 | HR 68 | Ht 72.0 in | Wt 200.4 lb

## 2020-02-04 DIAGNOSIS — E782 Mixed hyperlipidemia: Secondary | ICD-10-CM

## 2020-02-04 DIAGNOSIS — N1831 Type 2 diabetes mellitus with diabetic chronic kidney disease: Secondary | ICD-10-CM

## 2020-02-04 DIAGNOSIS — I10 Essential (primary) hypertension: Secondary | ICD-10-CM | POA: Diagnosis not present

## 2020-02-04 DIAGNOSIS — E039 Hypothyroidism, unspecified: Secondary | ICD-10-CM

## 2020-02-04 DIAGNOSIS — E559 Vitamin D deficiency, unspecified: Secondary | ICD-10-CM | POA: Diagnosis not present

## 2020-02-04 DIAGNOSIS — I25708 Atherosclerosis of coronary artery bypass graft(s), unspecified, with other forms of angina pectoris: Secondary | ICD-10-CM | POA: Diagnosis not present

## 2020-02-04 DIAGNOSIS — E1165 Type 2 diabetes mellitus with hyperglycemia: Secondary | ICD-10-CM | POA: Diagnosis not present

## 2020-02-04 DIAGNOSIS — E1121 Type 2 diabetes mellitus with diabetic nephropathy: Secondary | ICD-10-CM | POA: Diagnosis not present

## 2020-02-04 LAB — POCT GLYCOSYLATED HEMOGLOBIN (HGB A1C): Hemoglobin A1C: 6.4 % — AB (ref 4.0–5.6)

## 2020-02-04 NOTE — Progress Notes (Signed)
02/04/2020           Endocrinology follow-up note   Subjective:    Patient ID: Christian Bowman, male    DOB: 23-Nov-1945,    Past Medical History:  Diagnosis Date  . AICD (automatic cardioverter/defibrillator) present    medtronic-   DR. CROITORU , DR. Aundra Dubin   . Anginal pain ([place])    cp sat 08/11/17  . Anxiety   . CAD (coronary artery disease)   . CHF (congestive heart failure) ([place])   . Complication of anesthesia    took awhile to wake up   . Coronary artery disease involving coronary bypass graft   . Cyst of neck    right side  . DM2 (diabetes mellitus, type 2) ([place]) 08/26/2013  . Dyspnea   . Heart attack ([place])    "not sure when" (08/20/2017)  . HTN (hypertension) 08/26/2013  . Hyperlipidemia 08/26/2013  . Hypothyroidism   . Left main coronary artery disease   . Left renal artery stenosis ([place])    Genesis 6x12 stent 2007  . Obesity (BMI 30.0-34.9) 08/26/2013  . Postoperative atrial fibrillation ([place]) 10/15/2005  . Presence of permanent cardiac pacemaker   . S/P CABG x 4 10/13/2005   LIMA to LAD, SVG to intermediate branch, sequential SVG to PDA and RPL branch, EVH via right thigh  . S/P mitral valve replacement with bioprosthetic valve 07/11/2016   31 mm [facility] Mitral bovine bioprosthetic tissue valve  . S/P redo CABG x 2 07/11/2016   SVG to PDA and SVG to Intermediate Branch, EVH via left thigh   Past Surgical History:  Procedure Laterality Date  . A-FLUTTER ABLATION N/A 05/11/2017   Procedure: A-Flutter Ablation;  Surgeon: Constance Haw, MD;  Location: [place] [facility];  Service: Cardiovascular;  Laterality: N/A;  . CARDIAC CATHETERIZATION N/A 06/21/2016   Procedure: Right/Left Heart Cath and Coronary/Graft Angiography;  Surgeon: Sherren Mocha, MD;  Location: [place] [facility];  Service: Cardiovascular;  Laterality: N/A;  . CARDIAC VALVE REPLACEMENT    . CARDIOVERSION N/A 07/19/2016   Procedure: CARDIOVERSION;  Surgeon: Lelon Perla, MD;   Location: [facility] ENDOSCOPY;  Service: Cardiovascular;  Laterality: N/A;  . CARDIOVERSION N/A 09/08/2016   Procedure: CARDIOVERSION;  Surgeon: Fay Records, MD;  Location: [place];  Service: Cardiovascular;  Laterality: N/A;  . CORONARY ANGIOPLASTY     STENT 2016  [place]     DES in SVG to right coronary artery system  . CORONARY ARTERY BYPASS GRAFT  10/13/2005   LIMA to LAD, SVG to intermediate branch, sequential SVG to PDA and RPL  . CORONARY ARTERY BYPASS GRAFT N/A 07/11/2016   Procedure: REDO CORONARY ARTERY BYPASS GRAFTING (CABG) x two using left leg greater saphenous vein harvested endoscopically-SVG to PDA -SVG to RAMUS INTERMEDIATE;  Surgeon: Rexene Alberts, MD;  Location: [place];  Service: Open Heart Surgery;  Laterality: N/A;  . CORONARY ARTERY BYPASS GRAFT N/A 07/11/2016   Procedure: Re-exploration (CABG) for post op bleeding,;  Surgeon: Rexene Alberts, MD;  Location: [place];  Service: Open Heart Surgery;  Laterality: N/A;  . EP IMPLANTABLE DEVICE N/A 07/25/2016   Procedure: BiV ICD Insertion CRT-D;  Surgeon: Will Meredith Leeds, MD;  Location: [place] [facility];  Service: Cardiovascular;  Laterality: N/A;  . INGUINAL HERNIA REPAIR Left 08/20/2017  . INGUINAL HERNIA REPAIR Left 08/20/2017   Procedure: OPEN LEFT INGUINAL HERNIA REPAIR;  Surgeon: Alphonsa Overall,  MD;  Location: [place];  Service: General;  Laterality: Left;  . LEFT HEART CATH AND CORS/GRAFTS ANGIOGRAPHY N/A 12/18/2016   Procedure: Left Heart Cath and Cors/Grafts Angiography;  Surgeon: Sherren Mocha, MD;  Location: [place] [facility];  Service: Cardiovascular;  Laterality: N/A;  . LEFT HEART CATH AND CORS/GRAFTS ANGIOGRAPHY N/A 08/22/2017   Procedure: LEFT HEART CATH AND CORS/GRAFTS ANGIOGRAPHY;  Surgeon: Larey Dresser, MD;  Location: [place] [facility];  Service: Cardiovascular;  Laterality: N/A;  . MITRAL VALVE REPLACEMENT N/A 07/11/2016   Procedure: MITRAL VALVE  (MV) REPLACEMENT;  Surgeon: Rexene Alberts, MD;  Location: [place];  Service: Open Heart Surgery;  Laterality: N/A;  . MYOCARDICAL PERFUSION  10/09/2007   NORMAL PERFUSION IN ALL REGIONS;NO EVIDENCE OF INDUCIBLE ISCHEMIA;POST STRESS EF% 66  . RENAL ARTERY STENT Right 2007  . RENAL DOPPLER  03/28/2010   RIGHT RA-NORMAL;LEFT PROXIMAL RA AT STENT-PATENT WITH NO EVIDENCE OF SIGN DIAMETER REDUCTION. R & L KIDNEYS: EQUAL IN SIZE,SYMMETRICAL IN SHAPE.  [name] RIGHT HEART CATH N/A 01/08/2020   Procedure: RIGHT HEART CATH;  Surgeon: Larey Dresser, MD;  Location: [place] [facility];  Service: Cardiovascular;  Laterality: N/A;  . TEE WITHOUT CARDIOVERSION N/A 06/15/2016   Procedure: TRANSESOPHAGEAL ECHOCARDIOGRAM (TEE);  Surgeon: Sanda Klein, MD;  Location: [facility] ENDOSCOPY;  Service: Cardiovascular;  Laterality: N/A;  . TEE WITHOUT CARDIOVERSION N/A 07/11/2016   Procedure: TRANSESOPHAGEAL ECHOCARDIOGRAM (TEE);  Surgeon: Rexene Alberts, MD;  Location: [place];  Service: Open Heart Surgery;  Laterality: N/A;  . TEE WITHOUT CARDIOVERSION N/A 07/19/2016   Procedure: TRANSESOPHAGEAL ECHOCARDIOGRAM (TEE);  Surgeon: Lelon Perla, MD;  Location: [facility] ENDOSCOPY;  Service: Cardiovascular;  Laterality: N/A;  . TRANSESOPHAGEAL ECHOCARDIOGRAM  10/19/2005   NORMAL LV; MILD TO MODERATE AMOUNT OF SOFT ATHEROMATOUS PLAQUE OF THE THORACIC AORTA; THE LEFT ATRIUM IS MILDLY DILATED;LEFT ATRIAL APPENDAGE FUNCTION IS NORMAL;NO THROMBUS IDENTIFIED. SMALL PFO WITH RIGHT TO LEFT SHUNT   Social History   Socioeconomic History  . Marital status: Married    Spouse name: Not on file  . Number of children: 0  . Years of education: 24  . Highest education level: Not on file  Occupational History  . Occupation: retired  Tobacco Use  . Smoking status: Never Smoker  . Smokeless tobacco: Never Used  Substance and Sexual Activity  . Alcohol use: No  . Drug use: No  . Sexual activity: Never  Other Topics Concern  . Not on file  Social  History Narrative   Right handed   Two story home   Drinks half and half coffee   Social Determinants of Health   Financial Resource Strain:   . Difficulty of Paying Living Expenses:   Food Insecurity:   . Worried About Charity fundraiser in the Last Year:   . Arboriculturist in the Last Year:   Transportation Needs:   . Film/video editor (Medical):   [name] Lack of Transportation (Non-Medical):   Physical Activity:   . Days of Exercise per Week:   . Minutes of Exercise per Session:   Stress:   . Feeling of Stress :   Social Connections:   . Frequency of Communication with Friends and Family:   . Frequency of Social Gatherings with Friends and Family:   . Attends Religious Services:   . Active Member of Clubs or Organizations:   . Attends Archivist Meetings:   [name] Marital Status:    Outpatient  Encounter Medications as of 02/04/2020  Medication Sig  . amoxicillin (AMOXIL) 500 MG tablet Take 4 tablets (2,000 mg total) by mouth See admin instructions. Take 2000 mg 1 hour prior to dental work  . aspirin EC 81 MG tablet Take 81 mg by mouth daily.   . carvedilol (COREG) 12.5 MG tablet Take 1.5 tablets (18.75 mg total) by mouth 2 (two) times daily.  . cholecalciferol (VITAMIN D3) 25 MCG (1000 UNIT) tablet Take 1,000 Units by mouth 2 (two) times a week.   . clopidogrel (PLAVIX) 75 MG tablet Take 1 tablet (75 mg total) by mouth daily.  . digoxin (LANOXIN) 0.125 MG tablet Take 0.5 tablets (0.0625 mg total) by mouth daily.  [name] eplerenone (INSPRA) 50 MG tablet Take 1 tablet (50 mg total) by mouth daily.  . furosemide (LASIX) 20 MG tablet Take 20 mg by mouth daily as needed for fluid or edema.  . isosorbide mononitrate (IMDUR) 30 MG 24 hr tablet Take 30 mg by mouth daily as needed (anxiety).  [name] levothyroxine (SYNTHROID) 50 MCG tablet Take 1 tablet (50 mcg total) by mouth daily before breakfast.  . linagliptin (TRADJENTA) 5 MG TABS tablet Take 1 tablet (5 mg total) by mouth daily.   . Melatonin 1 MG CAPS Take 2 mg by mouth daily as needed (sleep).   . neomycin-bacitracin-polymyxin (NEOSPORIN) ointment Apply 1 application topically daily as needed for wound care.  . nitroGLYCERIN (NITROSTAT) 0.4 MG SL tablet Place 0.4 mg under the tongue every 5 (five) minutes as needed for chest pain.  . polyethylene glycol (MIRALAX / GLYCOLAX) packet Take 17 g by mouth daily as needed for mild constipation or moderate constipation.  . rosuvastatin (CRESTOR) 10 MG tablet Take 10 mg by mouth daily.  . sacubitril-valsartan (ENTRESTO) 49-51 MG Take 1 tablet by mouth 2 (two) times daily.  . sodium zirconium cyclosilicate (LOKELMA) 10 g PACK packet Take 10 g by mouth daily.  . Tetrahydrozoline HCl (VISINE OP) Place 1 drop into both eyes daily as needed (irritation).   No facility-administered encounter medications on file as of 02/04/2020.   ALLERGIES: Allergies  Allergen Reactions  . Xanax [Alprazolam] Other (See Comments)    Pt feels very weak, tired and feels paralyzed     VACCINATION STATUS: Immunization History  Administered Date(s) Administered  . Fluad Quad(high Dose 65+) 07/28/2019  . Influenza,inj,Quad PF,6+ Mos 08/01/2016, 07/11/2017  . Pneumococcal Polysaccharide-23 07/02/2015    Diabetes He presents for his follow-up diabetic visit. He has type 2 diabetes mellitus. Onset time: He was diagnosed at approximate age of 9 years. His disease course has been improving. There are no hypoglycemic associated symptoms. Pertinent negatives for hypoglycemia include no confusion, headaches, pallor or seizures. There are no diabetic associated symptoms. Pertinent negatives for diabetes include no chest pain, no fatigue, no polydipsia, no polyphagia, no polyuria and no weakness. There are no hypoglycemic complications. Symptoms are improving. Diabetic complications include heart disease. Risk factors for coronary artery disease include diabetes mellitus, dyslipidemia, hypertension, male sex  and sedentary lifestyle. He is compliant with treatment most of the time. His weight is fluctuating minimally. He participates in exercise intermittently. (His point-of-care A1c 6.4%, improving from 7.2%.  He denies hypoglycemia.) An ACE inhibitor/angiotensin II receptor blocker is being taken.  Hyperlipidemia This is a chronic problem. The current episode started more than 1 year ago. Exacerbating diseases include diabetes. Pertinent negatives include no chest pain, myalgias or shortness of breath. Current antihyperlipidemic treatment includes statins. Risk factors for coronary artery  disease include diabetes mellitus, dyslipidemia, hypertension and male sex.  Hypertension This is a chronic problem. The current episode started more than 1 year ago. Pertinent negatives include no chest pain, headaches, neck pain, palpitations or shortness of breath. Risk factors for coronary artery disease include diabetes mellitus and dyslipidemia. Hypertensive end-organ damage includes CAD/MI. Identifiable causes of hypertension include a thyroid problem.  Thyroid Problem Presents for follow-up visit. Patient reports no constipation, diarrhea, fatigue or palpitations. (Remains on levothyroxine 75 mcg p.o. nightly.  He is compliant.  He has no new complaints today.) The symptoms have been stable. His past medical history is significant for diabetes and hyperlipidemia.    Review of systems  Constitutional: + Minimally fluctuating body weight,  current  Body mass index is 27.18 kg/m. , no fatigue, no subjective hyperthermia, no subjective hypothermia Eyes: no blurry vision, no xerophthalmia ENT: no sore throat, no nodules palpated in throat, no dysphagia/odynophagia, no hoarseness Cardiovascular: no Chest Pain, no Shortness of Breath, no palpitations, no leg swelling Respiratory: no cough, no shortness of breath Gastrointestinal: no Nausea/Vomiting/Diarhhea Musculoskeletal: no muscle/joint aches Skin: no rashes, no  hyperemia Neurological: no tremors, no numbness, no tingling, no dizziness Psychiatric: no depression, no anxiety   Objective:    BP 127/72   Pulse 68   Ht 6' (1.829 m)   Wt 200 lb 6.4 oz (90.9 kg)   BMI 27.18 kg/m   Wt Readings from Last 3 Encounters:  02/04/20 200 lb 6.4 oz (90.9 kg)  01/26/20 202 lb 6.4 oz (91.8 kg)  01/19/20 200 lb (90.7 kg)     Physical Exam- Limited  Constitutional:  Body mass index is 27.18 kg/m. , not in acute distress, normal state of mind Eyes:  EOMI, no exophthalmos Neck: Supple Thyroid: No gross goiter Respiratory: Adequate breathing efforts Musculoskeletal: no gross deformities, strength intact in all four extremities, no gross restriction of joint movements Skin:  no rashes, no hyperemia Neurological: no tremor with outstretched hands,   Chemistry (most recent): Lab Results  Component Value Date   NA 137 01/26/2020   K 4.5 01/26/2020   CL 106 01/26/2020   CO2 22 01/26/2020   BUN 24 (H) 01/26/2020   CREATININE 1.29 (H) 01/26/2020    Results for DMETRI, ACRI (MRN [id number]) as of 02/04/2020 12:33  Ref. Range 01/26/2020 12:09 02/04/2020 11:53  Glucose Latest Ref Range: 70 - 99 mg/dL 120 (H)   Hemoglobin A1C Latest Ref Range: 4.0 - 5.6 %  6.4 (A)  TSH Latest Ref Range: 0.350 - 4.500 uIU/mL 3.643   T4,Free(Direct) Latest Ref Range: 0.61 - 1.12 ng/dL 1.15 (H)    Lipid Panel     Component Value Date/Time   CHOL 122 10/17/2019 1225   CHOL 120 10/01/2018 0925   TRIG 78 10/17/2019 1225   HDL 37 (L) 10/17/2019 1225   HDL 31 (L) 10/01/2018 0925   CHOLHDL 3.3 10/17/2019 1225   VLDL 16 10/17/2019 1225   LDLCALC 69 10/17/2019 1225   LDLCALC 60 10/01/2018 0925   LABVLDL 29 10/01/2018 0925     Assessment & Plan:   1. Type 2 diabetes mellitus with other circulatory complication ([place]), stage 3 CKD   His diabetes is  complicated by coronary artery disease status post coronary artery bypass graft and recent ACS and patient remains at a high  risk for more acute and chronic complications of diabetes which include CAD, CVA, CKD, retinopathy, and neuropathy. These are all discussed in detail with the patient.  His  point-of-care A1c today 6.4%, improving from 7.2%.  His most recent CMP shows improving renal function, does have history of stage 2-3 renal insufficiency. - I have re-counseled the patient on diet management  by adopting a carbohydrate restricted / protein rich  Diet.  - he  admits there is a room for improvement in his diet and drink choices. -  Suggestion is made for him to avoid simple carbohydrates  from his diet including Cakes, Sweet Desserts / Pastries, Ice Cream, Soda (diet and regular), Sweet Tea, Candies, Chips, Cookies, Sweet Pastries,  Store Bought Juices, Alcohol in Excess of  1-2 drinks a day, Artificial Sweeteners, Coffee Creamer, and "Sugar-free" Products. This will help patient to have stable blood glucose profile and potentially avoid unintended weight gain.   - Patient is advised to stick to a routine mealtimes to eat 3 meals  a day and avoid unnecessary snacks ( to snack only to correct hypoglycemia).   - I have approached patient with the following individualized plan to manage diabetes and patient agrees.  -He has benefited from reinitiation of cardiac cath.  He is advised to continue on Tradjenta 5 mg p.o. daily at bedtime.  Based on his presentation with controlled glycemia to target, he will not need additional intervention with insulin nor SGLT2 inhibitors.     - Patient specific target  for A1c; LDL, HDL, Triglycerides, were discussed in detail.  2) BP/HTN His blood pressure is controlled to target.   Reportedly he runs low blood pressure at home and would not tolerate any additional therapy. I advised him to continue his current medications including beta blocker, spironolactone and,  Lasix as needed.   3) Lipids/HPL: His most recent lipid panel showed controlled LDL at 69.  He is advised to  continue Crestor 10 mg p.o. nightly.  Side effects and precautions discussed with him.    4)  Weight/Diet: His BMI is 27-a candidate for some weight loss.  CDE consult in progress, exercise, and carbohydrates information provided.  5) hypothyroidism: - This is related to his therapy with amiodarone. His previsit thyroid function tests are consistent with improvement from his last presentation with slight over replacement.  He will benefit from his current dose of levothyroxine at 50 mcg daily before breakfast.    - We discussed about the correct intake of his thyroid hormone, on empty stomach at fasting, with water, separated by at least 30 minutes from breakfast and other medications,  and separated by more than 4 hours from calcium, iron, multivitamins, acid reflux medications (PPIs). -Patient is made aware of the fact that thyroid hormone replacement is needed for life, dose to be adjusted by periodic monitoring of thyroid function tests.   6) Chronic Care/Health Maintenance:  -Patient is  on ACEI/ARB and Statin medications and encouraged to continue to follow up with Ophthalmology, Podiatrist at least yearly or according to recommendations, and advised to  stay away from smoking. I have recommended yearly flu vaccine and pneumonia vaccination at least every 5 years, and  sleep for at least 7 hours a day.  I advised patient to maintain close follow up with his cardiologist and his  PCP for primary care needs.   - Time spent on this patient care encounter:  35 min, of which > 50% was spent in  counseling and the rest reviewing his blood glucose logs , discussing his hypoglycemia and hyperglycemia episodes, reviewing his current and  previous labs / studies  ( including abstraction from other facilities)  and medications  doses and developing a  long term treatment plan and documenting his care.   Please refer to Patient Instructions for Blood Glucose Monitoring and Insulin/Medications Dosing  Guide"  in media tab for additional information. Please  also refer to " Patient Self Inventory" in the Media  tab for reviewed elements of pertinent patient history.  Christian Bowman participated in the discussions, expressed understanding, and voiced agreement with the above plans.  All questions were answered to his satisfaction. he is encouraged to contact clinic should he have any questions or concerns prior to his return visit.     Follow up plan: Return in about 6 months (around 08/06/2020) for Follow up with Pre-visit Labs.  Glade Lloyd, MD Phone: [phone]  Fax: [phone]   This note was partially dictated with voice recognition software. Similar sounding words can be transcribed inadequately or may not  be corrected upon review.  02/04/2020, 12:31 PM

## 2020-02-05 ENCOUNTER — Ambulatory Visit (HOSPITAL_COMMUNITY)
Admission: RE | Admit: 2020-02-05 | Discharge: 2020-02-05 | Disposition: A | Discharge status: Home / Self Care | Payer: Medicare Other | Source: Ambulatory Visit | Attending: Internal Medicine | Admitting: Internal Medicine

## 2020-02-05 DIAGNOSIS — I5022 Chronic systolic (congestive) heart failure: Secondary | ICD-10-CM | POA: Insufficient documentation

## 2020-02-05 LAB — BASIC METABOLIC PANEL
Anion gap: 8 (ref 5–15)
BUN: 27 mg/dL — ABNORMAL HIGH (ref 8–23)
CO2: 26 mmol/L (ref 22–32)
Calcium: 9.2 mg/dL (ref 8.9–10.3)
Chloride: 104 mmol/L (ref 98–111)
Creatinine, Ser: 1.57 mg/dL — ABNORMAL HIGH (ref 0.61–1.24)
GFR calc Af Amer: 50 mL/min — ABNORMAL LOW (ref >60)
GFR calc non Af Amer: 43 mL/min — ABNORMAL LOW (ref >60)
Glucose, Bld: 105 mg/dL — ABNORMAL HIGH (ref 70–99)
Potassium: 4.4 mmol/L (ref 3.5–5.1)
Sodium: 138 mmol/L (ref 135–145)

## 2020-02-14 ENCOUNTER — Other Ambulatory Visit: Payer: Self-pay | Admitting: "Endocrinology

## 2020-03-09 ENCOUNTER — Ambulatory Visit (HOSPITAL_COMMUNITY)
Admission: RE | Admit: 2020-03-09 | Discharge: 2020-03-09 | Disposition: A | Discharge status: Home / Self Care | Payer: Medicare Other | Source: Ambulatory Visit | Attending: Cardiology | Admitting: Cardiology

## 2020-03-09 ENCOUNTER — Other Ambulatory Visit: Payer: Self-pay

## 2020-03-09 ENCOUNTER — Encounter (HOSPITAL_COMMUNITY): Payer: Self-pay | Admitting: Cardiology

## 2020-03-09 VITALS — BP 140/82 | HR 88 | Wt 199.0 lb

## 2020-03-09 DIAGNOSIS — I255 Ischemic cardiomyopathy: Secondary | ICD-10-CM | POA: Diagnosis not present

## 2020-03-09 DIAGNOSIS — E039 Hypothyroidism, unspecified: Secondary | ICD-10-CM | POA: Insufficient documentation

## 2020-03-09 DIAGNOSIS — N183 Chronic kidney disease, stage 3 unspecified: Secondary | ICD-10-CM | POA: Insufficient documentation

## 2020-03-09 DIAGNOSIS — Z953 Presence of xenogenic heart valve: Secondary | ICD-10-CM | POA: Diagnosis not present

## 2020-03-09 DIAGNOSIS — Z8673 Personal history of transient ischemic attack (TIA), and cerebral infarction without residual deficits: Secondary | ICD-10-CM | POA: Diagnosis not present

## 2020-03-09 DIAGNOSIS — I252 Old myocardial infarction: Secondary | ICD-10-CM | POA: Diagnosis not present

## 2020-03-09 DIAGNOSIS — I4892 Unspecified atrial flutter: Secondary | ICD-10-CM | POA: Diagnosis not present

## 2020-03-09 DIAGNOSIS — I5022 Chronic systolic (congestive) heart failure: Secondary | ICD-10-CM | POA: Insufficient documentation

## 2020-03-09 DIAGNOSIS — I25810 Atherosclerosis of coronary artery bypass graft(s) without angina pectoris: Secondary | ICD-10-CM | POA: Diagnosis not present

## 2020-03-09 DIAGNOSIS — D170 Benign lipomatous neoplasm of skin and subcutaneous tissue of head, face and neck: Secondary | ICD-10-CM | POA: Diagnosis not present

## 2020-03-09 DIAGNOSIS — Z951 Presence of aortocoronary bypass graft: Secondary | ICD-10-CM | POA: Diagnosis not present

## 2020-03-09 DIAGNOSIS — E1122 Type 2 diabetes mellitus with diabetic chronic kidney disease: Secondary | ICD-10-CM | POA: Insufficient documentation

## 2020-03-09 DIAGNOSIS — Z8249 Family history of ischemic heart disease and other diseases of the circulatory system: Secondary | ICD-10-CM | POA: Insufficient documentation

## 2020-03-09 DIAGNOSIS — Z79899 Other long term (current) drug therapy: Secondary | ICD-10-CM | POA: Diagnosis not present

## 2020-03-09 DIAGNOSIS — Z7902 Long term (current) use of antithrombotics/antiplatelets: Secondary | ICD-10-CM | POA: Diagnosis not present

## 2020-03-09 DIAGNOSIS — E785 Hyperlipidemia, unspecified: Secondary | ICD-10-CM | POA: Insufficient documentation

## 2020-03-09 DIAGNOSIS — I442 Atrioventricular block, complete: Secondary | ICD-10-CM | POA: Insufficient documentation

## 2020-03-09 DIAGNOSIS — Z7984 Long term (current) use of oral hypoglycemic drugs: Secondary | ICD-10-CM | POA: Diagnosis not present

## 2020-03-09 DIAGNOSIS — Z7982 Long term (current) use of aspirin: Secondary | ICD-10-CM | POA: Diagnosis not present

## 2020-03-09 LAB — BASIC METABOLIC PANEL
Anion gap: 9 (ref 5–15)
BUN: 24 mg/dL — ABNORMAL HIGH (ref 8–23)
CO2: 22 mmol/L (ref 22–32)
Calcium: 9.1 mg/dL (ref 8.9–10.3)
Chloride: 105 mmol/L (ref 98–111)
Creatinine, Ser: 1.51 mg/dL — ABNORMAL HIGH (ref 0.61–1.24)
GFR calc Af Amer: 53 mL/min — ABNORMAL LOW (ref >60)
GFR calc non Af Amer: 45 mL/min — ABNORMAL LOW (ref >60)
Glucose, Bld: 122 mg/dL — ABNORMAL HIGH (ref 70–99)
Potassium: 3.9 mmol/L (ref 3.5–5.1)
Sodium: 136 mmol/L (ref 135–145)

## 2020-03-09 LAB — DIGOXIN LEVEL: Digoxin Level: 0.3 ng/mL — ABNORMAL LOW (ref 0.8–2.0)

## 2020-03-09 MED ORDER — ENTRESTO 97-103 MG PO TABS: 1.0000 | ORAL_TABLET | Freq: Two times a day (BID) | ORAL | 3 refills | Status: DC

## 2020-03-09 NOTE — Patient Instructions (Signed)
Labs done today. We will contact you only if your labs are abnormal.  INCREASE Entresto 97/103mg (1 tablet) by mouth 2 times daily.  No other medication changes were made. Please continue all other medications as prescribed.  Your physician recommends that you schedule a follow-up appointment in: 2 weeks for a lab only appointment and in 3 months for an appointment with Dr. Aundra Dubin.  If you have any questions or concerns before your next appointment please send [place] a message through [place] or call our office at [phone].    TO LEAVE A MESSAGE FOR THE NURSE SELECT OPTION 2, PLEASE LEAVE A MESSAGE INCLUDING: . YOUR NAME . DATE OF BIRTH . CALL BACK NUMBER . REASON FOR CALL**this is important as we prioritize the call backs  [place] AS LONG AS YOU CALL BEFORE 4:00 PM  At the [facility], you and your health needs are our priority. As part of our continuing mission to provide you with exceptional heart care, we have created designated Provider Care Teams. These Care Teams include your primary Cardiologist (physician) and Advanced Practice Providers (APPs- Physician Assistants and Nurse Practitioners) who all work together to provide you with the care you need, when you need it.   You may see any of the following providers on your designated Care Team at your next follow up: [name] Dr Glori Bickers . Dr Loralie Champagne . Darrick Grinder, NP . Lyda Jester, PA . Audry Riles, PharmD   Please be sure to bring in all your medications bottles to every appointment.

## 2020-03-10 NOTE — Progress Notes (Signed)
Date:  03/10/2020   ID:  [name], DOB [birth year], MRN [id number]   Provider location: [facility] Type of Visit: Established patient   PCP:  Sharilyn Sites, MD  Cardiologist: Dr. Sallyanne Kuster  HF Cardiologist:  Loralie Champagne, MD   History of Present Illness: Christian Bowman is a 74 y.o. male with history of CAD s/p CABG in 2007 then redo CABG in 10/17 with mitral valve replacement.  He was admitted in 10/17 for redo CABG with SVG-PDA and SVG-ramus.  He also had mitral valve replacement with a bioprosthetic valve because of infarct-related mitral regurgitation.  Post-operative course was complicated by CHF requiring diuresis.  He also had atrial flutter and required DCCV.  Due to complete heart block, he later got a CRT-D system.    At a prior visit, he was in atrial flutter.  He saw Dr. Curt Bears, it was decided to arrange for DCCV with ablation down the road when PPM leads have been in longer.  He had successful DCCV in 12/17 and is in NSR today.   He was admitted in 3/18 with NSTEMI and chest pain. TnI only 0.5.  LHC showed occlusion of SVG-PDA from CABG#1 to be the likely culprit.  However, SVG-PDA from CABG#2 was patent.  No intervention.  Echo in 3/18 from [place] showed EF 30-35%, stable bioprosthetic mitral valve.   He had atrial flutter ablation in 8/18.  He is in NSR today and is off amiodarone.    In 11/18, he had an inguinal hernia repair. Post-op, he had an NSTEMI.  LHC showed occlusion of a PLV branch that had been backfilled by SVG-PDA (prior cath had shown severe diffuse disease in the PLV).  Echo in 11/18 showed EF 20-25% with normal bioprosthetic mitral valve.    With no recurrence of atrial arrhythmias, he is now off anticoagulation.   Echo in 7/20 showed EF 25-30%, bioprosthetic MV with no MR and mean gradient 5 mmHg, normal RV.    In 1/21, he developed right hand weakness and dysarthria concerning for CVA.  He went to the ER in [place] and had  tPA.  Symptoms resolved.  Atrial fibrillation was not noted per his wife's report.  CTA head/neck showed no significant carotid stenosis.  He was started on Plavix 75 mg daily and sent home.    CPX (3/21) showed severe functional impairment due to HF.  Echo showed EF 20-25%, normal RV, normally functioning bioprosthetic mitral valve.  RHC in 4/21 showed low filling pressures and low cardiac index (2.1 Fick, 1.9 thermo).  He saw Dr. Orvan Seen to discuss possible lateral thoracotomy access LVAD. He decided against LVAD placement.   He returns for followup of CHF.  He is actually doing a bit better.  He is back at cardiac rehab.  This has helped his strength and stamina.  No dyspnea walking for short distances on flat ground.  [place] with a short flight of stairs.  Able to walk into the office today without problems.  No recent chest pain. No orthopnea/PND.  We looked at him for a barostimulator device, but he would have to have the lipoma on his neck removed.  Weight down 3 lbs.   Medtronic device interrogation: 96% BiV pacing, thoracic impedance stable, no atrial fibrillation  ECG (personally reviewed): NSR, BiV pacing, PACs.   Labs (11/17): K 4.8, creatinine 1.89, hgb 12.3, digoxin 0.9, LFTs normal, TSH 8.235 (mild increase), free T3 low, free T4 normal, LFTs  normal.  Labs (12/17): K 4.5, creatinine 1.7, BNP 3909 Labs (3/18): K 3.9, creatinine 1.48, hgb 11.3 Labs (4/18): digoxin 0.5, LFTs normal Labs (7/18): K 5.1, creatinine 1.78, LDL 63, HDL 29, TSH elevated Labs (11/18): K 4.5, creatinine 1.86, hgb 13.6 Labs (12/18): K 5, creatinine 1.88 Labs (1/19): digoxin 0.3, K 4.7, creatinine 1.83 Labs (6/19): LDL 70, HDL 32, K 5.1, creatinine 1.88 Labs (12/19): LDL 60, HDL 31 Labs (1/20): K 5.1, creatinine 1.64 Labs (4/20): K 4.5, creatinine 1.77 Labs (8/20): K 4.6, creatinine 1.5 Labs (1/21): K 4.5, creatinine 1.58, LDL 69 Labs (2/21): K 4.5, creatinine 1.66 Labs (4/21): K 4.1, creatinine 1.63, digoxin  0.4 Labs (5/21): K 4.4, creatinine 1.51  PMH: 1. CAD: CABG 2007.  - LHC (9/17) with patent LIMA-LAD, totally occluded SVG-D, severe stenosis in SVG-PDA.  - Redo CABG 10/17 with SVG-PDA, SVG-ramus and mitral valve replacement.  - NSTEMI 3/18.  LHC with culprit lesion likely occlusion of SVG-PDA from CABG#1.  Patent SVG-PDA from CABG#2.  No intervention.  - NSTEMI 11/18 post-op inguinal hernia repair.  LHC with occlusion of a PLV branch that had been backfilled by SVG-PDA (prior cath had shown severe diffuse disease in the PLV).  2. Mitral regurgitation: Ischemic MR, mitral valve replacement was done in 10/17 (bioprosthetic). 3. Complete heart block: Post-op in 10/17.  Medtronic CRT-D placed.  4. Atrial flutter: DCCV 10/17 and again in 12/17.  - Ablation 8/18, now off amiodarone.  5. Type II diabetes 6. Hyperlipidemia 7. Chronic systolic CHF: Ischemic cardiomyopathy.   - TEE (10/17): EF 20-25%, severe LV dilation - Echo (3/18, [place]): EF 30-35%, bioprosthetic mitral valve with mean gradient 5 mmHg.  - Echo (11/18): EF 20-25%, moderate LV dilation, normal bioprosthetic mitral valve.  - Echo (7/20): EF 25-30%, moderate LV dilation, inferior/inferolateral AK, bioprosthetic MV with mean gradient 5 and no MR, normal RV.  - Painful gynecomastia with spironolactone.  - Echo (1/21): EF < 20%, normal RV size and systolic function, bioprosthetic mitral valve with mild MR, mean gradient 2 mmHg.  - CPX (3/21): peak VO2 11.2, VE/VCO2 slope 56, RER 1.21.  - Echo (3/21): EF 20-25%, no LV thrombus, RV normal, bioprosthetic mitral valve appear normal.  - RHC (4/21): mean RA 3, PA 25/7, mean PCWP 11, CI 2.1 (Fick), 1.9 (thermo).  8. Hypothyroidism 9. CVA 1/21: CTA head/neck with no carotid stenosis.   Current Outpatient Medications  Medication Sig Dispense Refill  . amoxicillin (AMOXIL) 500 MG tablet Take 4 tablets (2,000 mg total) by mouth See admin instructions. Take 2000 mg 1 hour prior to dental  work 4 tablet 0  . aspirin EC 81 MG tablet Take 81 mg by mouth daily.     . carvedilol (COREG) 12.5 MG tablet Take 1.5 tablets (18.75 mg total) by mouth 2 (two) times daily. 180 tablet 3  . cholecalciferol (VITAMIN D3) 25 MCG (1000 UNIT) tablet Take 1,000 Units by mouth 2 (two) times a week.     . clopidogrel (PLAVIX) 75 MG tablet Take 1 tablet (75 mg total) by mouth daily. 90 tablet 3  . digoxin (LANOXIN) 0.125 MG tablet Take 0.5 tablets (0.0625 mg total) by mouth daily. 15 tablet 3  . eplerenone (INSPRA) 50 MG tablet Take 1 tablet (50 mg total) by mouth daily. 30 tablet 4  . furosemide (LASIX) 20 MG tablet Take 20 mg by mouth daily as needed for fluid or edema.    . isosorbide mononitrate (IMDUR) 30 MG 24 hr tablet Take 30  mg by mouth daily as needed (anxiety).    [name] levothyroxine (SYNTHROID) 50 MCG tablet TAKE 1 TABLET BY MOUTH BEFORE BREAKFAST 30 tablet 5  . linagliptin (TRADJENTA) 5 MG TABS tablet Take 1 tablet (5 mg total) by mouth daily. 90 tablet 1  . Melatonin 1 MG CAPS Take 2 mg by mouth daily as needed (sleep).     . neomycin-bacitracin-polymyxin (NEOSPORIN) ointment Apply 1 application topically daily as needed for wound care.    . nitroGLYCERIN (NITROSTAT) 0.4 MG SL tablet Place 0.4 mg under the tongue every 5 (five) minutes as needed for chest pain.    . polyethylene glycol (MIRALAX / GLYCOLAX) packet Take 17 g by mouth daily as needed for mild constipation or moderate constipation.    . rosuvastatin (CRESTOR) 10 MG tablet Take 10 mg by mouth daily.    . sodium zirconium cyclosilicate (LOKELMA) 10 g PACK packet Take 10 g by mouth daily. 90 each 3  . Tetrahydrozoline HCl (VISINE OP) Place 1 drop into both eyes daily as needed (irritation).    . sacubitril-valsartan (ENTRESTO) 97-103 MG Take 1 tablet by mouth 2 (two) times daily. 180 tablet 3   No current facility-administered medications for this encounter.    Allergies:   Xanax [alprazolam]   Social History:  The patient   reports that he has never smoked. He has never used smokeless tobacco. He reports that he does not drink alcohol or use drugs.   Family History:  The patient's family history includes Heart attack in his father and mother; Heart disease in his father, maternal grandmother, and mother; Hypertension in his father and sister.   ROS:  Please see the history of present illness.   All other systems are personally reviewed and negative.   Exam:   BP 140/82   Pulse 88   Wt 90.3 kg (199 lb)   SpO2 98%   BMI 26.99 kg/m   General: NAD Neck: No JVD, no thyromegaly or thyroid nodule. Lipoma right neck.  Lungs: Clear to auscultation bilaterally with normal respiratory effort. CV: Nondisplaced PMI.  Heart regular S1/S2, no S3/S4, no murmur.  No peripheral edema.  No carotid bruit.  Normal pedal pulses.  Abdomen: Soft, nontender, no hepatosplenomegaly, no distention.  Skin: Intact without lesions or rashes.  Neurologic: Alert and oriented x 3.  Psych: Normal affect. Extremities: No clubbing or cyanosis.  HEENT: Normal.   Recent Labs: 10/17/2019: ALT 15; B Natriuretic Peptide 4,254.3 12/25/2019: Platelets 151 01/08/2020: Hemoglobin 13.6; Hemoglobin 13.6 01/26/2020: TSH 3.643 03/09/2020: BUN 24; Creatinine, Ser 1.51; Potassium 3.9; Sodium 136  Personally reviewed   Wt Readings from Last 3 Encounters:  03/09/20 90.3 kg (199 lb)  02/04/20 90.9 kg (200 lb 6.4 oz)  01/26/20 91.8 kg (202 lb 6.4 oz)    ASSESSMENT AND PLAN:  1. Chronic systolic CHF: Ischemic cardiomyopathy.  TEE 10/17 with EF 20-25%.  Echo in 3/21 shoed EF 20-25%, normal RV function, normal bioprosthetic mitral valve.  CPX in 3/21 showed severe functional impairment due to HF with concern for poor short-term prognosis.  RHC in 4/21 showed normal filling pressures but low cardiac index.  Symptoms somewhat better recently, NYHA class II-III, doing better since he joined cardiac rehab again. He is not volume overloaded by exam or Optivol.  -  Continue to use Lasix prn.    - Increase Entresto to 97/103 bid, BMET today and in 2 wks.  - Gynecomastia with spironolactone.  Continue eplerenone 50 mg daily.     -  Continue Coreg 18.75 mg bid.    - He has been on Lokelma chronically to control hyperkalemia and allow use of eplerenone and Entresto.  - His wife is concerned about using [place] and says that his endocrinologist did not want him to start it.  I think it would be helpful with CKD and CHF, but to avoid confusion will not press the point.   - Continue digoxin 0.0625 daily.  Check digoxin level.  - RHC showed low cardiac output matching the poor CPX.  I have talked to him about advanced therapies given poor prognosis. He is not a transplant candidate.  He has had 2 prior sternotomies, which makes LVAD more complicated.  He talked with Dr Orvan Seen about LVAD approach from left lateral thoracotomy.  We have discussed LVAD extensively.  He is very clear that he does not want another cardiac surgery.  - As he has turned down LVAD, we discussed barostimulation to help with his symptoms. His right neck lipoma poses a problem for carotid lead placement, however. He would need the lipoma removed.  As it is pressing on his neck more now, he is interested in having it taken off.  I would support this if it could be done without general anesthesia.  2. CAD: s/p redo CABG. Admission in 3/18 with NSTEMI, LHC showed occlusion of SVG-PDA from CABG#1 but patent SVG-PDA from CABG#2, no intervention.  He had a post-op NSTEMI after inguinal hernia repair in 11/18.  LHC showed occlusion of a PLV branch that had been backfilled by SVG-PDA (prior cath had shown severe diffuse disease in the PLV). No recent chest pain.     - Continue statin => Crestor 10 mg daily. Good lipids in 1/21.    - Continue ASA 81. 3. Bioprosthetic mitral valve: Stable 3/21 echo with no significant regurgitation or stenosis.   4. Atrial flutter: s/p ablation in 8/18.  He is in NSR and off  amiodarone.  - Now off warfarin with flutter ablation and no recurrence. If he has recurrence of atrial arrhythmias, based on most recent data DOAC would be a reasonable choice for him even with bioprosthetic MV.  5. CKD: Stage III.  BMET.   6. Type II diabetes: Followed by endocrinology. 7. CVA: In 1/21, got tPA at the ER in [place].  Symptoms resolved.  CTA head/neck showed no carotid stenosis.  Device interrogation has shown no atrial fibrillation. If atrial fibrillation is noted on future monitoring, will need anticoagulation.  - Continue ASA and Plavix 75 daily.  - Continue Crestor.   Signed, Loralie Champagne, MD  03/10/2020  [place] [address] [facility] [place] [address] [phone] (office) [phone] (fax)

## 2020-03-13 ENCOUNTER — Other Ambulatory Visit: Payer: Self-pay | Admitting: Cardiovascular Disease

## 2020-03-17 ENCOUNTER — Other Ambulatory Visit: Payer: Self-pay | Admitting: Cardiovascular Disease

## 2020-03-17 MED ORDER — ROSUVASTATIN CALCIUM 10 MG PO TABS: 10.0000 mg | ORAL_TABLET | Freq: Every day | ORAL | 1 refills | Status: DC

## 2020-03-17 NOTE — Telephone Encounter (Signed)
°*  STAT* If patient is at the pharmacy, call can be transferred to refill team.   1. Which medications need to be refilled? (please list name of each medication and dose if known) rosuvastatin (CRESTOR) 10 MG tablet  2. Which pharmacy/location (including street and city if local pharmacy) is medication to be sent to? [place], [place]  3. Do they need a 30 day or 90 day supply? [place]

## 2020-03-23 ENCOUNTER — Other Ambulatory Visit (HOSPITAL_COMMUNITY): Payer: Medicare Other

## 2020-03-24 ENCOUNTER — Ambulatory Visit (HOSPITAL_COMMUNITY)
Admission: RE | Admit: 2020-03-24 | Discharge: 2020-03-24 | Disposition: A | Discharge status: Home / Self Care | Payer: Medicare Other | Source: Ambulatory Visit | Attending: Internal Medicine | Admitting: Internal Medicine

## 2020-03-24 ENCOUNTER — Other Ambulatory Visit: Payer: Self-pay

## 2020-03-24 DIAGNOSIS — I5022 Chronic systolic (congestive) heart failure: Secondary | ICD-10-CM | POA: Diagnosis not present

## 2020-03-24 LAB — BASIC METABOLIC PANEL
Anion gap: 8 (ref 5–15)
BUN: 26 mg/dL — ABNORMAL HIGH (ref 8–23)
CO2: 28 mmol/L (ref 22–32)
Calcium: 9.2 mg/dL (ref 8.9–10.3)
Chloride: 104 mmol/L (ref 98–111)
Creatinine, Ser: 1.57 mg/dL — ABNORMAL HIGH (ref 0.61–1.24)
GFR calc Af Amer: 50 mL/min — ABNORMAL LOW (ref >60)
GFR calc non Af Amer: 43 mL/min — ABNORMAL LOW (ref >60)
Glucose, Bld: 106 mg/dL — ABNORMAL HIGH (ref 70–99)
Potassium: 4.4 mmol/L (ref 3.5–5.1)
Sodium: 140 mmol/L (ref 135–145)

## 2020-04-11 ENCOUNTER — Other Ambulatory Visit (HOSPITAL_COMMUNITY): Payer: Self-pay | Admitting: Cardiology

## 2020-04-11 ENCOUNTER — Other Ambulatory Visit: Payer: Self-pay | Admitting: "Endocrinology

## 2020-04-17 ENCOUNTER — Other Ambulatory Visit: Payer: Self-pay | Admitting: Cardiology

## 2020-04-27 ENCOUNTER — Ambulatory Visit (INDEPENDENT_AMBULATORY_CARE_PROVIDER_SITE_OTHER): Payer: Medicare Other | Admitting: *Deleted

## 2020-04-27 DIAGNOSIS — I255 Ischemic cardiomyopathy: Secondary | ICD-10-CM | POA: Diagnosis not present

## 2020-04-28 ENCOUNTER — Other Ambulatory Visit: Payer: Self-pay | Admitting: Cardiology

## 2020-04-28 LAB — CUP PACEART REMOTE DEVICE CHECK
Battery Remaining Longevity: 31 mo
Battery Voltage: 2.95 V
Brady Statistic AP VP Percent: 63.74 %
Brady Statistic AP VS Percent: 1.22 %
Brady Statistic AS VP Percent: 34.2 %
Brady Statistic AS VS Percent: 0.84 %
Brady Statistic RA Percent Paced: 63.76 %
Brady Statistic RV Percent Paced: 95.38 %
Date Time Interrogation Session: 20210727090402
HighPow Impedance: 66 Ohm
Implantable Lead Implant Date: 20171024
Implantable Lead Implant Date: 20171024
Implantable Lead Implant Date: 20171024
Implantable Lead Location: 753858
Implantable Lead Location: 753859
Implantable Lead Location: 753860
Implantable Lead Model: 4598
Implantable Lead Model: 5076
Implantable Pulse Generator Implant Date: 20171024
Lead Channel Impedance Value: 222.34 Ohm
Lead Channel Impedance Value: 230.327
Lead Channel Impedance Value: 234.08 Ohm
Lead Channel Impedance Value: 250.943
Lead Channel Impedance Value: 261.164
Lead Channel Impedance Value: 342 Ohm
Lead Channel Impedance Value: 418 Ohm
Lead Channel Impedance Value: 418 Ohm
Lead Channel Impedance Value: 456 Ohm
Lead Channel Impedance Value: 475 Ohm
Lead Channel Impedance Value: 513 Ohm
Lead Channel Impedance Value: 532 Ohm
Lead Channel Impedance Value: 551 Ohm
Lead Channel Impedance Value: 817 Ohm
Lead Channel Impedance Value: 817 Ohm
Lead Channel Impedance Value: 836 Ohm
Lead Channel Impedance Value: 874 Ohm
Lead Channel Impedance Value: 874 Ohm
Lead Channel Pacing Threshold Amplitude: 0.625 V
Lead Channel Pacing Threshold Amplitude: 0.75 V
Lead Channel Pacing Threshold Amplitude: 1 V
Lead Channel Pacing Threshold Pulse Width: 0.4 ms
Lead Channel Pacing Threshold Pulse Width: 0.4 ms
Lead Channel Pacing Threshold Pulse Width: 1 ms
Lead Channel Sensing Intrinsic Amplitude: 1 mV
Lead Channel Sensing Intrinsic Amplitude: 1 mV
Lead Channel Sensing Intrinsic Amplitude: 12 mV
Lead Channel Sensing Intrinsic Amplitude: 12 mV
Lead Channel Setting Pacing Amplitude: 1.25 V
Lead Channel Setting Pacing Amplitude: 2 V
Lead Channel Setting Pacing Amplitude: 2 V
Lead Channel Setting Pacing Pulse Width: 0.4 ms
Lead Channel Setting Pacing Pulse Width: 1 ms
Lead Channel Setting Sensing Sensitivity: 0.3 mV

## 2020-05-03 NOTE — Progress Notes (Signed)
Remote ICD transmission.   

## 2020-05-10 ENCOUNTER — Other Ambulatory Visit: Payer: Self-pay

## 2020-05-10 ENCOUNTER — Ambulatory Visit (INDEPENDENT_AMBULATORY_CARE_PROVIDER_SITE_OTHER): Payer: Medicare Other | Admitting: Cardiovascular Disease

## 2020-05-10 ENCOUNTER — Encounter: Payer: Self-pay | Admitting: Cardiovascular Disease

## 2020-05-10 VITALS — BP 117/72 | HR 67 | Ht 72.0 in | Wt 196.4 lb

## 2020-05-10 DIAGNOSIS — I25118 Atherosclerotic heart disease of native coronary artery with other forms of angina pectoris: Secondary | ICD-10-CM | POA: Diagnosis not present

## 2020-05-10 DIAGNOSIS — Z95810 Presence of automatic (implantable) cardiac defibrillator: Secondary | ICD-10-CM | POA: Diagnosis not present

## 2020-05-10 DIAGNOSIS — Z8679 Personal history of other diseases of the circulatory system: Secondary | ICD-10-CM

## 2020-05-10 DIAGNOSIS — E785 Hyperlipidemia, unspecified: Secondary | ICD-10-CM

## 2020-05-10 DIAGNOSIS — E1122 Type 2 diabetes mellitus with diabetic chronic kidney disease: Secondary | ICD-10-CM

## 2020-05-10 DIAGNOSIS — E1121 Type 2 diabetes mellitus with diabetic nephropathy: Secondary | ICD-10-CM

## 2020-05-10 DIAGNOSIS — Z953 Presence of xenogenic heart valve: Secondary | ICD-10-CM | POA: Diagnosis not present

## 2020-05-10 DIAGNOSIS — I255 Ischemic cardiomyopathy: Secondary | ICD-10-CM

## 2020-05-10 DIAGNOSIS — I5022 Chronic systolic (congestive) heart failure: Secondary | ICD-10-CM | POA: Diagnosis not present

## 2020-05-10 DIAGNOSIS — N1832 Chronic kidney disease, stage 3b: Secondary | ICD-10-CM

## 2020-05-10 DIAGNOSIS — K118 Other diseases of salivary glands: Secondary | ICD-10-CM

## 2020-05-10 DIAGNOSIS — I10 Essential (primary) hypertension: Secondary | ICD-10-CM

## 2020-05-10 DIAGNOSIS — Z9889 Other specified postprocedural states: Secondary | ICD-10-CM

## 2020-05-10 MED ORDER — CARVEDILOL 25 MG PO TABS: 25.0000 mg | ORAL_TABLET | Freq: Two times a day (BID) | ORAL | 11 refills | Status: DC

## 2020-05-10 NOTE — Progress Notes (Signed)
Cardiology Office Note    Date:  05/11/2020   ID:  Christian Bowman, DOB [birth year], MRN [id number]  PCP:  Sharilyn Sites, MD  Cardiologist:  Loralie Champagne, MD; Allegra Lai, MD; Sanda Klein, MD   Chief Complaint  Patient presents with  . Chest Pain  . Congestive Heart Failure    History of Present Illness:  Christian Bowman is a 74 y.o. male with extensive and severe cardiac problems.  He has severe cardiomyopathy with LVEF of 20-25% (most recent echo March 2021) due to both ischemic cardiomyopathy and valvular heart disease.  He initially had a CABG in 2007, redo CABG in October 2017 when he also received a mitral valve bioprosthesis (Edwards magna 31 mm, implanted 2017).  He has a dual-chamber biventricular defibrillator (Medtronic) also implanted in 2017.  He has a history of atrial flutter with successful ablation and has been off anticoagulation for the last couple of years.  In January 2021 he had transient right hand weakness and dysarthria and received thrombolytics at the emergency room in [place].  His device did not show atrial arrhythmia and echocardiogram with contrast did not show LV thrombus.  He was discharged on clopidogrel.  He has deteriorated slowly doing the last 18 months of isolation from Covid.  He was not able to go to the gym and this has led to both physical and mental reduction and stability.  He is able to exercise at the gym only twice a week now.  He has a lot of difficulty climbing stairs which make him very short of breath, but is able to walk on level ground for long distances.  He has had some episodes of chest pain that are predictably brought on only by emotional distress (usually watching different social and political news on [employer]).  He never has chest discomfort during physical activity.  He does not have orthopnea or PND and has not been troubled by leg edema.  He only takes furosemide "as needed" and has not taken any in months.  On the other hand he did  quite poorly on his cardiopulmonary stress test.  Dr. Aundra Dubin recommended consideration of LVAD but he declined.  He has not had any focal neurological events.  He denies intermittent claudication  He is not aware of palpitations but has frequently noticed slow heart rate using different recording devices such as automatic blood pressure cuffs.  Often his heart rate has been in the high 30s.  It appears to me that this is due to very frequent premature ventricular contractions with artifactual bradycardia.  He has a CRT-D device in place with a lower rate limit set at 70 bpm.  He has a 94.4% effective biventricular pacing, with the difference 200% being made up by very frequent PVCs.  That these are seen during interrogation of his device in the clinic today often occurring every third or fourth beat.  Otherwise his ECG on presentation shows atrial paced, ventricular paced rhythm with a distinct positive R wave in V1 consistent with effective LV capture, although his QRS is quite broad at 206 ms.  His Medtronic Claria CRT-D device was implanted in 2017 and still has approximately 2.5 years of generator longevity.  Lead parameters are all within normal range.  There have been no recent episodes of atrial fibrillation and he has only had one very brief episode of nonsustained ventricular tachycardia, asymptomatic.  His right submandibular mass continues to slowly increase in size.  It does not really  bother him except for cosmetic reasons, but it did become an issue when a barostim device was considered (that was later abandoned).  Glycemic control remains good with a hemoglobin A1c that was recently 6.4%.  Most recent creatinine was 1.57.  He is on maximum dose Entresto and high-dose carvedilol 18.75 mg twice daily as well as eplerenone (had gynecomastia with spironolactone) and digoxin.  He takes a low-dose of long-acting nitrates.  He underwent redo coronary artery bypass 2 (SVG to PDA, SVG to ramus),  as well as mitral valve replacement (bioprosthetic Edwards magna 31 mm) on 85/27/7824, complicated by AV block and atrial flutter requiring cardioversion. Estimated EF 20-25 % on his 07/18/16 TEE. A biventricular defibrillator (Medtronic Claria MRI CRT-D) was implanted on July 25, 2016. On December 18, 2016 he was hospitalized for small non-STEMI and was found to have interval occlusion of the old SVG to PDA, while the more recently placed SVG was still patent. LVEDP was quite low at only 3 mmHg during that catheterization. He had atrial flutter ablation in August 2018 and is off anticoagulation. He had a NSTEMI in November 2018 after inguinal hernia repair surgery.  He understands the need for endocarditis prophylaxis with dental procedures or other medical procedures that might cause bacteremia and the indication for anticoagulation chronically.  He is still following up with Dr. Aundra Dubin and Dr. Curt Bears .  Past Medical History:  Diagnosis Date  . AICD (automatic cardioverter/defibrillator) present    medtronic-   DR. Dominique Calvey , DR. Aundra Dubin   . Anginal pain ([place])    cp sat 08/11/17  . Anxiety   . CAD (coronary artery disease)   . CHF (congestive heart failure) ([place])   . Complication of anesthesia    took awhile to wake up   . Coronary artery disease involving coronary bypass graft   . Cyst of neck    right side  . DM2 (diabetes mellitus, type 2) ([place]) 08/26/2013  . Dyspnea   . Heart attack ([place])    "not sure when" (08/20/2017)  . HTN (hypertension) 08/26/2013  . Hyperlipidemia 08/26/2013  . Hypothyroidism   . Left main coronary artery disease   . Left renal artery stenosis ([place])    Genesis 6x12 stent 2007  . Obesity (BMI 30.0-34.9) 08/26/2013  . Postoperative atrial fibrillation ([place]) 10/15/2005  . Presence of permanent cardiac pacemaker   . S/P CABG x 4 10/13/2005   LIMA to LAD, SVG to intermediate branch, sequential SVG to PDA and RPL branch, EVH via right thigh  . S/P mitral valve  replacement with bioprosthetic valve 07/11/2016   31 mm [facility] Mitral bovine bioprosthetic tissue valve  . S/P redo CABG x 2 07/11/2016   SVG to PDA and SVG to Intermediate Branch, EVH via left thigh    Past Surgical History:  Procedure Laterality Date  . A-FLUTTER ABLATION N/A 05/11/2017   Procedure: A-Flutter Ablation;  Surgeon: Constance Haw, MD;  Location: [place] CV LAB;  Service: Cardiovascular;  Laterality: N/A;  . CARDIAC CATHETERIZATION N/A 06/21/2016   Procedure: Right/Left Heart Cath and Coronary/Graft Angiography;  Surgeon: Sherren Mocha, MD;  Location: [place] [facility] LAB;  Service: Cardiovascular;  Laterality: N/A;  . CARDIAC VALVE REPLACEMENT    . CARDIOVERSION N/A 07/19/2016   Procedure: CARDIOVERSION;  Surgeon: Lelon Perla, MD;  Location: [facility] ENDOSCOPY;  Service: Cardiovascular;  Laterality: N/A;  . CARDIOVERSION N/A 09/08/2016   Procedure: CARDIOVERSION;  Surgeon: Fay Records, MD;  Location: [place];  Service: Cardiovascular;  Laterality: N/A;  . CORONARY ANGIOPLASTY     STENT 2016  [place]     DES in SVG to right coronary artery system  . CORONARY ARTERY BYPASS GRAFT  10/13/2005   LIMA to LAD, SVG to intermediate branch, sequential SVG to PDA and RPL  . CORONARY ARTERY BYPASS GRAFT N/A 07/11/2016   Procedure: REDO CORONARY ARTERY BYPASS GRAFTING (CABG) x two using left leg greater saphenous vein harvested endoscopically-SVG to PDA -SVG to RAMUS INTERMEDIATE;  Surgeon: Rexene Alberts, MD;  Location: [place];  Service: Open Heart Surgery;  Laterality: N/A;  . CORONARY ARTERY BYPASS GRAFT N/A 07/11/2016   Procedure: Re-exploration (CABG) for post op bleeding,;  Surgeon: Rexene Alberts, MD;  Location: [place];  Service: Open Heart Surgery;  Laterality: N/A;  . EP IMPLANTABLE DEVICE N/A 07/25/2016   Procedure: BiV ICD Insertion CRT-D;  Surgeon: Will Meredith Leeds, MD;  Location: [place] CV  LAB;  Service: Cardiovascular;  Laterality: N/A;  . INGUINAL HERNIA REPAIR Left 08/20/2017  . INGUINAL HERNIA REPAIR Left 08/20/2017   Procedure: OPEN LEFT INGUINAL HERNIA REPAIR;  Surgeon: Alphonsa Overall, MD;  Location: [place];  Service: General;  Laterality: Left;  . LEFT HEART CATH AND CORS/GRAFTS ANGIOGRAPHY N/A 12/18/2016   Procedure: Left Heart Cath and Cors/Grafts Angiography;  Surgeon: Sherren Mocha, MD;  Location: [place] CV LAB;  Service: Cardiovascular;  Laterality: N/A;  . LEFT HEART CATH AND CORS/GRAFTS ANGIOGRAPHY N/A 08/22/2017   Procedure: LEFT HEART CATH AND CORS/GRAFTS ANGIOGRAPHY;  Surgeon: Larey Dresser, MD;  Location: [place] CV LAB;  Service: Cardiovascular;  Laterality: N/A;  . MITRAL VALVE REPLACEMENT N/A 07/11/2016   Procedure: MITRAL VALVE (MV) REPLACEMENT;  Surgeon: Rexene Alberts, MD;  Location: [place];  Service: Open Heart Surgery;  Laterality: N/A;  . MYOCARDICAL PERFUSION  10/09/2007   NORMAL PERFUSION IN ALL REGIONS;NO EVIDENCE OF INDUCIBLE ISCHEMIA;POST STRESS EF% 66  . RENAL ARTERY STENT Right 2007  . RENAL DOPPLER  03/28/2010   RIGHT RA-NORMAL;LEFT PROXIMAL RA AT STENT-PATENT WITH NO EVIDENCE OF SIGN DIAMETER REDUCTION. R & L KIDNEYS: EQUAL IN SIZE,SYMMETRICAL IN SHAPE.  [name] RIGHT HEART CATH N/A 01/08/2020   Procedure: RIGHT HEART CATH;  Surgeon: Larey Dresser, MD;  Location: [place] CV LAB;  Service: Cardiovascular;  Laterality: N/A;  . TEE WITHOUT CARDIOVERSION N/A 06/15/2016   Procedure: TRANSESOPHAGEAL ECHOCARDIOGRAM (TEE);  Surgeon: Sanda Klein, MD;  Location: [facility] ENDOSCOPY;  Service: Cardiovascular;  Laterality: N/A;  . TEE WITHOUT CARDIOVERSION N/A 07/11/2016   Procedure: TRANSESOPHAGEAL ECHOCARDIOGRAM (TEE);  Surgeon: Rexene Alberts, MD;  Location: [place];  Service: Open Heart Surgery;  Laterality: N/A;  . TEE WITHOUT CARDIOVERSION N/A 07/19/2016   Procedure: TRANSESOPHAGEAL ECHOCARDIOGRAM (TEE);  Surgeon: Lelon Perla, MD;  Location: [facility]  ENDOSCOPY;  Service: Cardiovascular;  Laterality: N/A;  . TRANSESOPHAGEAL ECHOCARDIOGRAM  10/19/2005   NORMAL LV; MILD TO MODERATE AMOUNT OF SOFT ATHEROMATOUS PLAQUE OF THE THORACIC AORTA; THE LEFT ATRIUM IS MILDLY DILATED;LEFT ATRIAL APPENDAGE FUNCTION IS NORMAL;NO THROMBUS IDENTIFIED. SMALL PFO WITH RIGHT TO LEFT SHUNT    Current Medications: Outpatient Medications Prior to Visit  Medication Sig Dispense Refill  . amoxicillin (AMOXIL) 500 MG tablet Take 4 tablets (2,000 mg total) by mouth See admin instructions. Take 2000 mg 1 hour prior to dental work 4 tablet 0  . aspirin EC 81 MG tablet Take 81 mg by mouth daily.     [name]  cholecalciferol (VITAMIN D3) 25 MCG (1000 UNIT) tablet Take 1,000 Units by mouth 2 (two) times a week.     . clopidogrel (PLAVIX) 75 MG tablet Take 1 tablet (75 mg total) by mouth daily. 90 tablet 3  . digoxin (LANOXIN) 0.125 MG tablet Take 1/2 (one-half) tablet by mouth once daily 45 tablet 3  . eplerenone (INSPRA) 50 MG tablet Take 1 tablet (50 mg total) by mouth daily. 30 tablet 4  . furosemide (LASIX) 20 MG tablet Take 20 mg by mouth daily as needed for fluid or edema.    . isosorbide mononitrate (IMDUR) 30 MG 24 hr tablet Take 30 mg by mouth daily as needed (anxiety).    [name] levothyroxine (SYNTHROID) 50 MCG tablet TAKE 1 TABLET BY MOUTH BEFORE BREAKFAST 30 tablet 5  . Melatonin 1 MG CAPS Take 2 mg by mouth daily as needed (sleep).     . neomycin-bacitracin-polymyxin (NEOSPORIN) ointment Apply 1 application topically daily as needed for wound care.    . nitroGLYCERIN (NITROSTAT) 0.4 MG SL tablet Place 0.4 mg under the tongue every 5 (five) minutes as needed for chest pain.    . polyethylene glycol (MIRALAX / GLYCOLAX) packet Take 17 g by mouth daily as needed for mild constipation or moderate constipation.    . rosuvastatin (CRESTOR) 10 MG tablet Take 1 tablet (10 mg total) by mouth daily. 90 tablet 1  . sacubitril-valsartan (ENTRESTO) 97-103 MG Take 1 tablet by mouth 2  (two) times daily. 180 tablet 3  . sodium zirconium cyclosilicate (LOKELMA) 10 g PACK packet Take 10 g by mouth daily. 90 each 3  . Tetrahydrozoline HCl (VISINE OP) Place 1 drop into both eyes daily as needed (irritation).    . TRADJENTA 5 MG TABS tablet Take 1 tablet by mouth once daily 90 tablet 1  . carvedilol (COREG) 12.5 MG tablet Take 1.5 tablets (18.75 mg total) by mouth 2 (two) times daily with a meal. Please make yearly appt with Dr. Curt Bears for September for future refills. 1st attempt 270 tablet 0   No facility-administered medications prior to visit.     Allergies:   Xanax [alprazolam]   Social History   Socioeconomic History  . Marital status: Married    Spouse name: Not on file  . Number of children: 0  . Years of education: 62  . Highest education level: Not on file  Occupational History  . Occupation: retired  Tobacco Use  . Smoking status: Never Smoker  . Smokeless tobacco: Never Used  Vaping Use  . Vaping Use: Never used  Substance and Sexual Activity  . Alcohol use: No  . Drug use: No  . Sexual activity: Never  Other Topics Concern  . Not on file  Social History Narrative   Right handed   Two story home   Drinks half and half coffee   Social Determinants of Health   Financial Resource Strain:   . Difficulty of Paying Living Expenses:   Food Insecurity:   . Worried About Charity fundraiser in the Last Year:   . Arboriculturist in the Last Year:   Transportation Needs:   . Film/video editor (Medical):   [name] Lack of Transportation (Non-Medical):   Physical Activity:   . Days of Exercise per Week:   . Minutes of Exercise per Session:   Stress:   . Feeling of Stress :   Social Connections:   . Frequency of Communication with Friends and Family:   .  Frequency of Social Gatherings with Friends and Family:   . Attends Religious Services:   . Active Member of Clubs or Organizations:   . Attends Archivist Meetings:   [name] Marital Status:       Family History:  The patient's family history includes Heart attack in his father and mother; Heart disease in his father, maternal grandmother, and mother; Hypertension in his father and sister.   ROS:   Please see the history of present illness.    ROS All other systems are reviewed and are negative.   PHYSICAL EXAM:   VS:  BP 117/72   Pulse 67   Ht 6' (1.829 m)   Wt 196 lb 6.4 oz (89.1 kg)   SpO2 99%   BMI 26.64 kg/m     General: Alert, oriented x3, no distress, healthy left subclavian defibrillator site Head: no evidence of trauma, PERRL, EOMI, no exophtalmos or lid lag, no myxedema, no xanthelasma; normal ears, nose and oropharynx Neck: normal jugular venous pulsations and no hepatojugular reflux; brisk carotid pulses without delay and no carotid bruits Chest: clear to auscultation, no signs of consolidation by percussion or palpation, normal fremitus, symmetrical and full respiratory excursions Cardiovascular: normal position and quality of the apical impulse, regular rhythm, normal first and second heart sounds, no murmurs, rubs or gallops Abdomen: no tenderness or distention, no masses by palpation, no abnormal pulsatility or arterial bruits, normal bowel sounds, no hepatosplenomegaly Extremities: no clubbing, cyanosis or edema; 2+ radial, ulnar and brachial pulses bilaterally; 2+ right femoral, posterior tibial and dorsalis pedis pulses; 2+ left femoral, posterior tibial and dorsalis pedis pulses; no subclavian or femoral bruits Neurological: grossly nonfocal Psych: Normal mood and affect  Wt Readings from Last 3 Encounters:  05/10/20 196 lb 6.4 oz (89.1 kg)  03/09/20 199 lb (90.3 kg)  02/04/20 200 lb 6.4 oz (90.9 kg)      Studies/Labs Reviewed:   ECHO 12/01/2019 1. No LV thrombus identified.  2. Left ventricular ejection fraction, by estimation, is 20 to 25%. The  left ventricle has severely decreased function. The left ventricle  demonstrates global  hypokinesis. Left ventricular diastolic function could  not be evaluated.  3. Right ventricular systolic function is normal. The right ventricular  size is normal. There is normal pulmonary artery systolic pressure.  4. 07/11/16 53mm Edwards Magna Mitral Valve Replacement   . The mitral valve has been repaired/replaced. No evidence of mitral  valve regurgitation. No evidence of mitral stenosis.  5. The aortic valve is normal in structure and function. Aortic valve  regurgitation is not visualized. No aortic stenosis is present.  6. The inferior vena cava is dilated in size with >50% respiratory  variability, suggesting right atrial pressure of 8 mmHg.    EKG:  EKG is not ordered today.  Intracardiac electrogram shows atrial sensed, biventricular paced rhythm  Recent Labs: 10/17/2019: ALT 15; B Natriuretic Peptide 4,254.3 12/25/2019: Platelets 151 01/08/2020: Hemoglobin 13.6; Hemoglobin 13.6 01/26/2020: TSH 3.643 03/24/2020: BUN 26; Creatinine, Ser 1.57; Potassium 4.4; Sodium 140   Lipid Panel    Component Value Date/Time   CHOL 122 10/17/2019 1225   CHOL 120 10/01/2018 0925   TRIG 78 10/17/2019 1225   HDL 37 (L) 10/17/2019 1225   HDL 31 (L) 10/01/2018 0925   CHOLHDL 3.3 10/17/2019 1225   VLDL 16 10/17/2019 1225   LDLCALC 69 10/17/2019 1225   LDLCALC 60 10/01/2018 0925    ASSESSMENT:    1. Chronic systolic CHF (congestive  heart failure) ([place])   2. Coronary artery disease of native artery of native heart with stable angina pectoris ([place])   3. History of atrial flutter   4. Status post mitral valve replacement with bioprosthetic valve   5. Presence of biventricular implantable cardioverter-defibrillator (ICD)   6. Essential hypertension   7. Dyslipidemia (high LDL; low HDL)   8. Type 2 diabetes mellitus with stage 3b chronic kidney disease, without long-term current use of insulin (HCC)   9. Stage 3b chronic kidney disease   10. History of stent insertion of renal artery    11. Submandibular gland mass      PLAN:  In order of problems listed above:  1. CHF: NYHA functional class II, able to exercise at the gym.  Not requiring loop diuretics and maintaining euvolemic status.  He is on high-dose Entresto, carvedilol, eplerenone and digoxin.  NYHA functional class II, euvolemic on a very low-dose of loop diuretic as well as Entresto, carvedilol, eplerenone.   No recent problems with hyperkalemia.  Reviewed sodium restricted diet and daily weight monitoring. 2. CAD: Has occasional angina pectoris in response to emotional stimuli, but not with physical activity.  Okay to take sublingual nitroglycerin intermittently. 3. AFlutter: Successful ablation, off antiarrhythmics and off anticoagulants. 4. S/P MVR: Normal bioprosthetic valve function by echo in March.  Reaffirmed the need for endocarditis prophylaxis. 5. CRT-D: Normal device function.  He loses over 5% of his CRT due to very frequent PVCs.  Will increase the carvedilol to 25 mg twice daily.  It does appear that his blood pressure will tolerate this. 6. Hx HTN: Well-controlled. 7. HLP: On statin with excellent LDL cholesterol, even with some improvement in his HDL cholesterol which is now only borderline low. 8. DM: Excellent control.  Avoid TZD's.  Consider empagliflozin or dapagliflozin. 9. CKD 3b: Stable; most recent creatinine 1.57, corresponding to a GFR of just under 45; predisposes him to hyperkalemia while receiving Entresto and eplerenone. 10. History of left renal artery stenosis status post stent 2007.  Stable renal function and well-controlled hypertension. 11. Submandibular mass: Reluctant to undergo general anesthesia which would be necessary for resection of this mass.  Also aware of the risk for cranial nerve palsy.    Medication Adjustments/Labs and Tests Ordered: Current medicines are reviewed at length with the patient today.  Concerns regarding medicines are outlined above.  Medication  changes, Labs and Tests ordered today are listed in the Patient Instructions below. Patient Instructions  Medication Instructions:  INCREASE the Carvedilol to 25 mg twice daily  *If you need a refill on your cardiac medications before your next appointment, please call your pharmacy*   Lab Work: None ordered If you have labs (blood work) drawn today and your tests are completely normal, you will receive your results only by: [name] MyChart Message (if you have MyChart) OR . A paper copy in the mail If you have any lab test that is abnormal or we need to change your treatment, we will call you to review the results.   Testing/Procedures: None ordered   Follow-Up: At [facility], you and your health needs are our priority.  As part of our continuing mission to provide you with exceptional heart care, we have created designated Provider Care Teams.  These Care Teams include your primary Cardiologist (physician) and Advanced Practice Providers (APPs -  Physician Assistants and Nurse Practitioners) who all work together to provide you with the care you need, when you need it.  We  recommend signing up for the patient portal called "MyChart".  Sign up information is provided on this After Visit Summary.  MyChart is used to connect with patients for Virtual Visits (Telemedicine).  Patients are able to view lab/test results, encounter notes, upcoming appointments, etc.  Non-urgent messages can be sent to your provider as well.   To learn more about what you can do with MyChart, go to [email or web address].    Your next appointment:   6 month(s)  The format for your next appointment:   In Person  Provider:   Sanda Klein, MD      Signed, Sanda Klein, MD  05/11/2020 5:23 PM    [address], [place], [place]  [address] Phone: [phone]; Fax: [phone]

## 2020-05-10 NOTE — Patient Instructions (Addendum)
Medication Instructions:  INCREASE the Carvedilol to 25 mg twice daily  *If you need a refill on your cardiac medications before your next appointment, please call your pharmacy*   Lab Work: None ordered If you have labs (blood work) drawn today and your tests are completely normal, you will receive your results only by: [name] MyChart Message (if you have MyChart) OR . A paper copy in the mail If you have any lab test that is abnormal or we need to change your treatment, we will call you to review the results.   Testing/Procedures: None ordered   Follow-Up: At [facility], you and your health needs are our priority.  As part of our continuing mission to provide you with exceptional heart care, we have created designated Provider Care Teams.  These Care Teams include your primary Cardiologist (physician) and Advanced Practice Providers (APPs -  Physician Assistants and Nurse Practitioners) who all work together to provide you with the care you need, when you need it.  We recommend signing up for the patient portal called "MyChart".  Sign up information is provided on this After Visit Summary.  MyChart is used to connect with patients for Virtual Visits (Telemedicine).  Patients are able to view lab/test results, encounter notes, upcoming appointments, etc.  Non-urgent messages can be sent to your provider as well.   To learn more about what you can do with MyChart, go to [email or web address].    Your next appointment:   6 month(s)  The format for your next appointment:   In Person  Provider:   Sanda Klein, MD

## 2020-05-11 ENCOUNTER — Encounter: Payer: Self-pay | Admitting: Cardiovascular Disease

## 2020-06-09 ENCOUNTER — Encounter (HOSPITAL_COMMUNITY): Payer: Medicare Other | Admitting: Cardiology

## 2020-06-12 ENCOUNTER — Other Ambulatory Visit (HOSPITAL_COMMUNITY): Payer: Self-pay | Admitting: Cardiology

## 2020-06-15 ENCOUNTER — Other Ambulatory Visit: Payer: Self-pay | Admitting: *Deleted

## 2020-06-15 MED ORDER — CARVEDILOL 12.5 MG PO TABS: 18.7500 mg | ORAL_TABLET | Freq: Two times a day (BID) | ORAL | Status: DC

## 2020-06-16 ENCOUNTER — Ambulatory Visit (HOSPITAL_COMMUNITY)
Admission: RE | Admit: 2020-06-16 | Discharge: 2020-06-16 | Disposition: A | Discharge status: Home / Self Care | Payer: Medicare Other | Source: Ambulatory Visit | Attending: Cardiology | Admitting: Cardiology

## 2020-06-16 ENCOUNTER — Other Ambulatory Visit: Payer: Self-pay

## 2020-06-16 VITALS — BP 118/80 | HR 77 | Wt 195.4 lb

## 2020-06-16 DIAGNOSIS — I25810 Atherosclerosis of coronary artery bypass graft(s) without angina pectoris: Secondary | ICD-10-CM | POA: Insufficient documentation

## 2020-06-16 DIAGNOSIS — E875 Hyperkalemia: Secondary | ICD-10-CM | POA: Diagnosis not present

## 2020-06-16 DIAGNOSIS — Z951 Presence of aortocoronary bypass graft: Secondary | ICD-10-CM | POA: Insufficient documentation

## 2020-06-16 DIAGNOSIS — Z7902 Long term (current) use of antithrombotics/antiplatelets: Secondary | ICD-10-CM | POA: Insufficient documentation

## 2020-06-16 DIAGNOSIS — I491 Atrial premature depolarization: Secondary | ICD-10-CM | POA: Diagnosis not present

## 2020-06-16 DIAGNOSIS — Z8673 Personal history of transient ischemic attack (TIA), and cerebral infarction without residual deficits: Secondary | ICD-10-CM | POA: Insufficient documentation

## 2020-06-16 DIAGNOSIS — Z8249 Family history of ischemic heart disease and other diseases of the circulatory system: Secondary | ICD-10-CM | POA: Insufficient documentation

## 2020-06-16 DIAGNOSIS — N183 Chronic kidney disease, stage 3 unspecified: Secondary | ICD-10-CM | POA: Insufficient documentation

## 2020-06-16 DIAGNOSIS — I252 Old myocardial infarction: Secondary | ICD-10-CM | POA: Insufficient documentation

## 2020-06-16 DIAGNOSIS — I5022 Chronic systolic (congestive) heart failure: Secondary | ICD-10-CM | POA: Diagnosis not present

## 2020-06-16 DIAGNOSIS — Z953 Presence of xenogenic heart valve: Secondary | ICD-10-CM | POA: Diagnosis not present

## 2020-06-16 DIAGNOSIS — E039 Hypothyroidism, unspecified: Secondary | ICD-10-CM | POA: Diagnosis not present

## 2020-06-16 DIAGNOSIS — Z7984 Long term (current) use of oral hypoglycemic drugs: Secondary | ICD-10-CM | POA: Insufficient documentation

## 2020-06-16 DIAGNOSIS — Z79899 Other long term (current) drug therapy: Secondary | ICD-10-CM | POA: Insufficient documentation

## 2020-06-16 DIAGNOSIS — E1122 Type 2 diabetes mellitus with diabetic chronic kidney disease: Secondary | ICD-10-CM | POA: Diagnosis not present

## 2020-06-16 DIAGNOSIS — Z95810 Presence of automatic (implantable) cardiac defibrillator: Secondary | ICD-10-CM | POA: Diagnosis not present

## 2020-06-16 DIAGNOSIS — D170 Benign lipomatous neoplasm of skin and subcutaneous tissue of head, face and neck: Secondary | ICD-10-CM | POA: Diagnosis not present

## 2020-06-16 DIAGNOSIS — Z8679 Personal history of other diseases of the circulatory system: Secondary | ICD-10-CM | POA: Diagnosis not present

## 2020-06-16 DIAGNOSIS — I255 Ischemic cardiomyopathy: Secondary | ICD-10-CM | POA: Insufficient documentation

## 2020-06-16 DIAGNOSIS — I25118 Atherosclerotic heart disease of native coronary artery with other forms of angina pectoris: Secondary | ICD-10-CM | POA: Diagnosis not present

## 2020-06-16 DIAGNOSIS — E785 Hyperlipidemia, unspecified: Secondary | ICD-10-CM | POA: Insufficient documentation

## 2020-06-16 DIAGNOSIS — Z7982 Long term (current) use of aspirin: Secondary | ICD-10-CM | POA: Diagnosis not present

## 2020-06-16 DIAGNOSIS — I4892 Unspecified atrial flutter: Secondary | ICD-10-CM | POA: Diagnosis not present

## 2020-06-16 DIAGNOSIS — Z79890 Hormone replacement therapy (postmenopausal): Secondary | ICD-10-CM | POA: Insufficient documentation

## 2020-06-16 LAB — BASIC METABOLIC PANEL
Anion gap: 8 (ref 5–15)
BUN: 29 mg/dL — ABNORMAL HIGH (ref 8–23)
CO2: 24 mmol/L (ref 22–32)
Calcium: 8.9 mg/dL (ref 8.9–10.3)
Chloride: 105 mmol/L (ref 98–111)
Creatinine, Ser: 1.58 mg/dL — ABNORMAL HIGH (ref 0.61–1.24)
GFR calc Af Amer: 50 mL/min — ABNORMAL LOW (ref >60)
GFR calc non Af Amer: 43 mL/min — ABNORMAL LOW (ref >60)
Glucose, Bld: 118 mg/dL — ABNORMAL HIGH (ref 70–99)
Potassium: 4.8 mmol/L (ref 3.5–5.1)
Sodium: 137 mmol/L (ref 135–145)

## 2020-06-16 LAB — DIGOXIN LEVEL: Digoxin Level: 0.4 ng/mL — ABNORMAL LOW (ref 0.8–2.0)

## 2020-06-16 NOTE — Patient Instructions (Addendum)
Labs done today, your results will be available in MyChart, we will contact you for abnormal readings.  You have been referred to Dr Lucia Gaskins for the lipoma on your neck, his office will call you to schedule, if you have not heard from them in a week or 2 please call and let us know  Your physician recommends that you schedule a follow-up appointment in: 3-4 months  If you have any questions or concerns before your next appointment please send [place] a message through [place] or call our office at [phone].    TO LEAVE A MESSAGE FOR THE NURSE SELECT OPTION 2, PLEASE LEAVE A MESSAGE INCLUDING: . YOUR NAME . DATE OF BIRTH . CALL BACK NUMBER . REASON FOR CALL**this is important as we prioritize the call backs  [place] AS LONG AS YOU CALL BEFORE 4:00 PM  At the [facility], you and your health needs are our priority. As part of our continuing mission to provide you with exceptional heart care, we have created designated Provider Care Teams. These Care Teams include your primary Cardiologist (physician) and Advanced Practice Providers (APPs- Physician Assistants and Nurse Practitioners) who all work together to provide you with the care you need, when you need it.   You may see any of the following providers on your designated Care Team at your next follow up: [name] Dr Glori Bickers . Dr Loralie Champagne . Darrick Grinder, NP . Lyda Jester, PA . Audry Riles, PharmD   Please be sure to bring in all your medications bottles to every appointment.

## 2020-06-17 NOTE — Progress Notes (Signed)
Date:  06/17/2020   ID:  [name], DOB 06-21-1946, MRN [id number]   Provider location: [place] [facility] Type of Visit: Established patient   PCP:  Sharilyn Sites, MD  Cardiologist: Dr. Sallyanne Kuster  HF Cardiologist:  Loralie Champagne, MD   History of Present Illness: Christian Bowman is a 74 y.o. male with history of CAD s/p CABG in 2007 then redo CABG in 10/17 with mitral valve replacement.  He was admitted in 10/17 for redo CABG with SVG-PDA and SVG-ramus.  He also had mitral valve replacement with a bioprosthetic valve because of infarct-related mitral regurgitation.  Post-operative course was complicated by CHF requiring diuresis.  He also had atrial flutter and required DCCV.  Due to complete heart block, he later got a CRT-D system.    At a prior visit, he was in atrial flutter.  He saw Dr. Curt Bears, it was decided to arrange for DCCV with ablation down the road when PPM leads have been in longer.  He had successful DCCV in 12/17 and is in NSR today.   He was admitted in 3/18 with NSTEMI and chest pain. TnI only 0.5.  LHC showed occlusion of SVG-PDA from CABG#1 to be the likely culprit.  However, SVG-PDA from CABG#2 was patent.  No intervention.  Echo in 3/18 from [place] showed EF 30-35%, stable bioprosthetic mitral valve.   He had atrial flutter ablation in 8/18.  He is in NSR today and is off amiodarone.    In 11/18, he had an inguinal hernia repair. Post-op, he had an NSTEMI.  LHC showed occlusion of a PLV branch that had been backfilled by SVG-PDA (prior cath had shown severe diffuse disease in the PLV).  Echo in 11/18 showed EF 20-25% with normal bioprosthetic mitral valve.    With no recurrence of atrial arrhythmias, he is now off anticoagulation.   Echo in 7/20 showed EF 25-30%, bioprosthetic MV with no MR and mean gradient 5 mmHg, normal RV.    In 1/21, he developed right hand weakness and dysarthria concerning for CVA.  He went to the ER in [place] and  had tPA.  Symptoms resolved.  Atrial fibrillation was not noted per his wife's report.  CTA head/neck showed no significant carotid stenosis.  He was started on Plavix 75 mg daily and sent home.    CPX (3/21) showed severe functional impairment due to HF.  Echo showed EF 20-25%, normal RV, normally functioning bioprosthetic mitral valve.  RHC in 4/21 showed low filling pressures and low cardiac index (2.1 Fick, 1.9 thermo).  He saw Dr. Orvan Seen to discuss possible lateral thoracotomy access LVAD. He decided against LVAD placement.   He returns for followup of CHF.  He did not tolerate increasing Coreg to 25 mg bid recently and is back on 18.75 mg bid. He is doing maintenance cardiac rehab twice a week.  He is mildly short of breath walking up stairs.  No dyspnea on flat ground.  Rare chest pain, not with exertion. He only gets chest pain with anger/anxiety (watching the news).    Medtronic device interrogation: >95% BiV pacing, thoracic impedance stable, no atrial fibrillation  ECG (personally reviewed): NSR, BiV pacing.   Labs (11/17): K 4.8, creatinine 1.89, hgb 12.3, digoxin 0.9, LFTs normal, TSH 8.235 (mild increase), free T3 low, free T4 normal, LFTs normal.  Labs (12/17): K 4.5, creatinine 1.7, BNP 3909 Labs (3/18): K 3.9, creatinine 1.48, hgb 11.3 Labs (4/18): digoxin 0.5, LFTs  normal Labs (7/18): K 5.1, creatinine 1.78, LDL 63, HDL 29, TSH elevated Labs (11/18): K 4.5, creatinine 1.86, hgb 13.6 Labs (12/18): K 5, creatinine 1.88 Labs (1/19): digoxin 0.3, K 4.7, creatinine 1.83 Labs (6/19): LDL 70, HDL 32, K 5.1, creatinine 1.88 Labs (12/19): LDL 60, HDL 31 Labs (1/20): K 5.1, creatinine 1.64 Labs (4/20): K 4.5, creatinine 1.77 Labs (8/20): K 4.6, creatinine 1.5 Labs (1/21): K 4.5, creatinine 1.58, LDL 69 Labs (2/21): K 4.5, creatinine 1.66 Labs (4/21): K 4.1, creatinine 1.63, digoxin 0.4 Labs (5/21): K 4.4, creatinine 1.51 Labs (6/21): K 4.4, creatinine 1.51, digoxin  0.3  PMH: 1. CAD: CABG 2007.  - LHC (9/17) with patent LIMA-LAD, totally occluded SVG-D, severe stenosis in SVG-PDA.  - Redo CABG 10/17 with SVG-PDA, SVG-ramus and mitral valve replacement.  - NSTEMI 3/18.  LHC with culprit lesion likely occlusion of SVG-PDA from CABG#1.  Patent SVG-PDA from CABG#2.  No intervention.  - NSTEMI 11/18 post-op inguinal hernia repair.  LHC with occlusion of a PLV branch that had been backfilled by SVG-PDA (prior cath had shown severe diffuse disease in the PLV).  2. Mitral regurgitation: Ischemic MR, mitral valve replacement was done in 10/17 (bioprosthetic). 3. Complete heart block: Post-op in 10/17.  Medtronic CRT-D placed.  4. Atrial flutter: DCCV 10/17 and again in 12/17.  - Ablation 8/18, now off amiodarone.  5. Type II diabetes 6. Hyperlipidemia 7. Chronic systolic CHF: Ischemic cardiomyopathy.   - TEE (10/17): EF 20-25%, severe LV dilation - Echo (3/18, [place]): EF 30-35%, bioprosthetic mitral valve with mean gradient 5 mmHg.  - Echo (11/18): EF 20-25%, moderate LV dilation, normal bioprosthetic mitral valve.  - Echo (7/20): EF 25-30%, moderate LV dilation, inferior/inferolateral AK, bioprosthetic MV with mean gradient 5 and no MR, normal RV.  - Painful gynecomastia with spironolactone.  - Echo (1/21): EF < 20%, normal RV size and systolic function, bioprosthetic mitral valve with mild MR, mean gradient 2 mmHg.  - CPX (3/21): peak VO2 11.2, VE/VCO2 slope 56, RER 1.21.  - Echo (3/21): EF 20-25%, no LV thrombus, RV normal, bioprosthetic mitral valve appear normal.  - RHC (4/21): mean RA 3, PA 25/7, mean PCWP 11, CI 2.1 (Fick), 1.9 (thermo).  8. Hypothyroidism 9. CVA 1/21: CTA head/neck with no carotid stenosis.   Current Outpatient Medications  Medication Sig Dispense Refill  . amoxicillin (AMOXIL) 500 MG tablet Take 4 tablets (2,000 mg total) by mouth See admin instructions. Take 2000 mg 1 hour prior to dental work 4 tablet 0  . aspirin EC 81 MG  tablet Take 81 mg by mouth daily.     . carvedilol (COREG) 12.5 MG tablet Take 1.5 tablets (18.75 mg total) by mouth 2 (two) times daily with a meal.    . cholecalciferol (VITAMIN D3) 25 MCG (1000 UNIT) tablet Take 1,000 Units by mouth 2 (two) times a week.     . clopidogrel (PLAVIX) 75 MG tablet Take 1 tablet (75 mg total) by mouth daily. 90 tablet 3  . digoxin (LANOXIN) 0.125 MG tablet Take 1/2 (one-half) tablet by mouth once daily 45 tablet 3  . eplerenone (INSPRA) 50 MG tablet Take 1 tablet by mouth once daily 30 tablet 11  . furosemide (LASIX) 20 MG tablet Take 20 mg by mouth daily as needed for fluid or edema.    . isosorbide mononitrate (IMDUR) 30 MG 24 hr tablet Take 30 mg by mouth daily as needed (anxiety).    [name] levothyroxine (SYNTHROID) 50 MCG tablet  TAKE 1 TABLET BY MOUTH BEFORE BREAKFAST 30 tablet 5  . Melatonin 1 MG CAPS Take 2 mg by mouth daily as needed (sleep).     . neomycin-bacitracin-polymyxin (NEOSPORIN) ointment Apply 1 application topically daily as needed for wound care.    . nitroGLYCERIN (NITROSTAT) 0.4 MG SL tablet Place 0.4 mg under the tongue every 5 (five) minutes as needed for chest pain.    . polyethylene glycol (MIRALAX / GLYCOLAX) packet Take 17 g by mouth daily as needed for mild constipation or moderate constipation.    . rosuvastatin (CRESTOR) 10 MG tablet Take 1 tablet (10 mg total) by mouth daily. 90 tablet 1  . sacubitril-valsartan (ENTRESTO) 97-103 MG Take 1 tablet by mouth 2 (two) times daily. 180 tablet 3  . sodium zirconium cyclosilicate (LOKELMA) 10 g PACK packet Take 10 g by mouth daily. 90 each 3  . Tetrahydrozoline HCl (VISINE OP) Place 1 drop into both eyes daily as needed (irritation).    . TRADJENTA 5 MG TABS tablet Take 1 tablet by mouth once daily 90 tablet 1   No current facility-administered medications for this encounter.    Allergies:   Xanax [alprazolam]   Social History:  The patient  reports that he has never smoked. He has never used  smokeless tobacco. He reports that he does not drink alcohol and does not use drugs.   Family History:  The patient's family history includes Heart attack in his father and mother; Heart disease in his father, maternal grandmother, and mother; Hypertension in his father and sister.   ROS:  Please see the history of present illness.   All other systems are personally reviewed and negative.   Exam:   BP 118/80   Pulse 77   Wt 88.6 kg (195 lb 6.4 oz)   SpO2 98%   BMI 26.50 kg/m   General: NAD Neck: No JVD, no thyromegaly or thyroid nodule. Large lipoma right neck.  Lungs: Clear to auscultation bilaterally with normal respiratory effort. CV: Nondisplaced PMI.  Heart regular S1/S2, no S3/S4, no murmur.  No peripheral edema.  No carotid bruit.  Normal pedal pulses.  Abdomen: Soft, nontender, no hepatosplenomegaly, no distention.  Skin: Intact without lesions or rashes.  Neurologic: Alert and oriented x 3.  Psych: Normal affect. Extremities: No clubbing or cyanosis.  HEENT: Normal.    Recent Labs: 10/17/2019: ALT 15; B Natriuretic Peptide 4,254.3 12/25/2019: Platelets 151 01/08/2020: Hemoglobin 13.6; Hemoglobin 13.6 01/26/2020: TSH 3.643 06/16/2020: BUN 29; Creatinine, Ser 1.58; Potassium 4.8; Sodium 137  Personally reviewed   Wt Readings from Last 3 Encounters:  06/16/20 88.6 kg (195 lb 6.4 oz)  05/10/20 89.1 kg (196 lb 6.4 oz)  03/09/20 90.3 kg (199 lb)    ASSESSMENT AND PLAN:  1. Chronic systolic CHF: Ischemic cardiomyopathy.  TEE 10/17 with EF 20-25%.  Echo in 3/21 shoed EF 20-25%, normal RV function, normal bioprosthetic mitral valve.  CPX in 3/21 showed severe functional impairment due to HF with concern for poor short-term prognosis.  RHC in 4/21 showed normal filling pressures but low cardiac index.  I talked to him about advanced therapies given poor prognosis with low cardiac output. He is not a transplant candidate.  He has had 2 prior sternotomies, which makes LVAD more  complicated.  He talked with Dr Orvan Seen about LVAD approach from left lateral thoracotomy.  We have discussed LVAD extensively.  He is very clear that he does not want another cardiac surgery. Symptoms somewhat better recently, NYHA  class II-III. He is not volume overloaded by exam or Optivol.  - Continue to use Lasix prn.    - Continue Entresto 97/103 bid. BMET today.  - Gynecomastia with spironolactone.  Continue eplerenone 50 mg daily.     - Continue Coreg 18.75 mg bid, he did not tolerate increase to 25 mg bid.     - He has been on Lokelma chronically to control hyperkalemia and allow use of eplerenone and Entresto.  - His wife is concerned about using [place] and says that his endocrinologist did not want him to start it.  I think it would be helpful with CKD and CHF, but to avoid confusion will not press the point.   - Continue digoxin 0.0625 daily.  Check digoxin level.  - As he has turned down LVAD, we discussed vagal nerve stimulation (ANTHEM trial) to help with his symptoms. His right neck lipoma poses a problem for vagal lead placement, however. He would need the lipoma removed.  As it is pressing on his neck more now, he is interested in having it taken off.  I would support this if it could be done without general anesthesia.  I will refer him to Dr. Lucia Gaskins with ENT to assess.  2. CAD: s/p redo CABG. Admission in 3/18 with NSTEMI, LHC showed occlusion of SVG-PDA from CABG#1 but patent SVG-PDA from CABG#2, no intervention.  He had a post-op NSTEMI after inguinal hernia repair in 11/18.  LHC showed occlusion of a PLV branch that had been backfilled by SVG-PDA (prior cath had shown severe diffuse disease in the PLV). Minimal recent chest pain.     - Continue statin => Crestor 10 mg daily. Good lipids in 1/21.    - Continue ASA 81. 3. Bioprosthetic mitral valve: Stable 3/21 echo with no significant regurgitation or stenosis.   4. Atrial flutter: s/p ablation in 8/18.  He is in NSR and off  amiodarone.  - Now off warfarin with flutter ablation and no recurrence. If he has recurrence of atrial arrhythmias, based on most recent data DOAC would be a reasonable choice for him even with bioprosthetic MV.  5. CKD: Stage III.  BMET.   6. Type II diabetes: Followed by endocrinology. 7. CVA: In 1/21, got tPA at the ER in [place].  Symptoms resolved.  CTA head/neck showed no carotid stenosis.  Device interrogation has shown no atrial fibrillation. If atrial fibrillation is noted on future monitoring, will need anticoagulation.  - Continue ASA and Plavix 75 daily.  - Continue Crestor.   Followup in 3 months.   Signed, Loralie Champagne, MD  06/17/2020  [facility] [address] [facility] [place] [address] [phone] (office) [phone] (fax)

## 2020-06-28 ENCOUNTER — Encounter (HOSPITAL_COMMUNITY): Payer: Self-pay

## 2020-07-15 ENCOUNTER — Encounter: Payer: Self-pay | Admitting: Cardiology

## 2020-07-15 ENCOUNTER — Ambulatory Visit (INDEPENDENT_AMBULATORY_CARE_PROVIDER_SITE_OTHER): Payer: Medicare Other | Admitting: Cardiology

## 2020-07-15 ENCOUNTER — Other Ambulatory Visit: Payer: Self-pay

## 2020-07-15 VITALS — BP 130/70 | HR 84 | Ht 72.0 in | Wt 196.6 lb

## 2020-07-15 DIAGNOSIS — I255 Ischemic cardiomyopathy: Secondary | ICD-10-CM | POA: Diagnosis not present

## 2020-07-15 NOTE — Progress Notes (Signed)
Electrophysiology Office Note   Date:  07/15/2020   ID:  Christian Bowman, DOB 12-22-1945, MRN [id number]  PCP:  Sharilyn Sites, MD  Cardiologist:  Aundra Dubin Primary Electrophysiologist:  Icelyn Navarrete Meredith Leeds, MD    No chief complaint on file.    History of Present Illness: Christian Bowman is a 74 y.o. male who presents today for electrophysiology evaluation.   He has a history of coronary artery disease status post CABG in 2007 with redo CABG October 2017 with mitral valve replacement.  He had a bioprosthetic mitral valve due to infarct-related mitral regurgitation.  Postoperative he developed complete heart block and is now status post Medtronic CRT-D implanted 07/25/2016.  He subsequently went into atrial flutter and is now status post atrial flutter ablation 05/11/2017.   Today, denies symptoms of palpitations, chest pain, shortness of breath, orthopnea, PND, lower extremity edema, claudication, dizziness, presyncope, syncope, bleeding, or neurologic sequela. The patient is tolerating medications without difficulties.  Since last being seen he has done well.  He does continue to have weakness and fatigue, but this has been consistent since his valve surgery.  He does get dizzy at times such as when he stands up quickly.   Past Medical History:  Diagnosis Date  . AICD (automatic cardioverter/defibrillator) present    medtronic-   DR. CROITORU , DR. Aundra Dubin   . Anginal pain ([place])    cp sat 08/11/17  . Anxiety   . CAD (coronary artery disease)   . CHF (congestive heart failure) ([place])   . Complication of anesthesia    took awhile to wake up   . Coronary artery disease involving coronary bypass graft   . Cyst of neck    right side  . DM2 (diabetes mellitus, type 2) ([place]) 08/26/2013  . Dyspnea   . Heart attack ([place])    "not sure when" (08/20/2017)  . HTN (hypertension) 08/26/2013  . Hyperlipidemia 08/26/2013  . Hypothyroidism   . Left main coronary artery disease   . Left renal artery  stenosis ([place])    Genesis 6x12 stent 2007  . Obesity (BMI 30.0-34.9) 08/26/2013  . Postoperative atrial fibrillation ([place]) 10/15/2005  . Presence of permanent cardiac pacemaker   . S/P CABG x 4 10/13/2005   LIMA to LAD, SVG to intermediate branch, sequential SVG to PDA and RPL branch, EVH via right thigh  . S/P mitral valve replacement with bioprosthetic valve 07/11/2016   31 mm [facility] Mitral bovine bioprosthetic tissue valve  . S/P redo CABG x 2 07/11/2016   SVG to PDA and SVG to Intermediate Branch, EVH via left thigh   Past Surgical History:  Procedure Laterality Date  . A-FLUTTER ABLATION N/A 05/11/2017   Procedure: A-Flutter Ablation;  Surgeon: Constance Haw, MD;  Location: [place] CV LAB;  Service: Cardiovascular;  Laterality: N/A;  . CARDIAC CATHETERIZATION N/A 06/21/2016   Procedure: Right/Left Heart Cath and Coronary/Graft Angiography;  Surgeon: Sherren Mocha, MD;  Location: [place] [facility] LAB;  Service: Cardiovascular;  Laterality: N/A;  . CARDIAC VALVE REPLACEMENT    . CARDIOVERSION N/A 07/19/2016   Procedure: CARDIOVERSION;  Surgeon: Lelon Perla, MD;  Location: [facility] ENDOSCOPY;  Service: Cardiovascular;  Laterality: N/A;  . CARDIOVERSION N/A 09/08/2016   Procedure: CARDIOVERSION;  Surgeon: Fay Records, MD;  Location: [place];  Service: Cardiovascular;  Laterality: N/A;  . CORONARY ANGIOPLASTY     STENT 2016  [place]  DES in SVG to right coronary artery system  . CORONARY ARTERY BYPASS GRAFT  10/13/2005   LIMA to LAD, SVG to intermediate branch, sequential SVG to PDA and RPL  . CORONARY ARTERY BYPASS GRAFT N/A 07/11/2016   Procedure: REDO CORONARY ARTERY BYPASS GRAFTING (CABG) x two using left leg greater saphenous vein harvested endoscopically-SVG to PDA -SVG to RAMUS INTERMEDIATE;  Surgeon: Rexene Alberts, MD;  Location: [place];  Service: Open Heart Surgery;  Laterality: N/A;  . CORONARY  ARTERY BYPASS GRAFT N/A 07/11/2016   Procedure: Re-exploration (CABG) for post op bleeding,;  Surgeon: Rexene Alberts, MD;  Location: [place];  Service: Open Heart Surgery;  Laterality: N/A;  . EP IMPLANTABLE DEVICE N/A 07/25/2016   Procedure: BiV ICD Insertion CRT-D;  Surgeon: Vandy Tsuchiya Meredith Leeds, MD;  Location: [place] CV LAB;  Service: Cardiovascular;  Laterality: N/A;  . INGUINAL HERNIA REPAIR Left 08/20/2017  . INGUINAL HERNIA REPAIR Left 08/20/2017   Procedure: OPEN LEFT INGUINAL HERNIA REPAIR;  Surgeon: Alphonsa Overall, MD;  Location: [place];  Service: General;  Laterality: Left;  . LEFT HEART CATH AND CORS/GRAFTS ANGIOGRAPHY N/A 12/18/2016   Procedure: Left Heart Cath and Cors/Grafts Angiography;  Surgeon: Sherren Mocha, MD;  Location: [place] CV LAB;  Service: Cardiovascular;  Laterality: N/A;  . LEFT HEART CATH AND CORS/GRAFTS ANGIOGRAPHY N/A 08/22/2017   Procedure: LEFT HEART CATH AND CORS/GRAFTS ANGIOGRAPHY;  Surgeon: Larey Dresser, MD;  Location: [place] CV LAB;  Service: Cardiovascular;  Laterality: N/A;  . MITRAL VALVE REPLACEMENT N/A 07/11/2016   Procedure: MITRAL VALVE (MV) REPLACEMENT;  Surgeon: Rexene Alberts, MD;  Location: [place];  Service: Open Heart Surgery;  Laterality: N/A;  . MYOCARDICAL PERFUSION  10/09/2007   NORMAL PERFUSION IN ALL REGIONS;NO EVIDENCE OF INDUCIBLE ISCHEMIA;POST STRESS EF% 66  . RENAL ARTERY STENT Right 2007  . RENAL DOPPLER  03/28/2010   RIGHT RA-NORMAL;LEFT PROXIMAL RA AT STENT-PATENT WITH NO EVIDENCE OF SIGN DIAMETER REDUCTION. R & L KIDNEYS: EQUAL IN SIZE,SYMMETRICAL IN SHAPE.  [name] RIGHT HEART CATH N/A 01/08/2020   Procedure: RIGHT HEART CATH;  Surgeon: Larey Dresser, MD;  Location: [place] CV LAB;  Service: Cardiovascular;  Laterality: N/A;  . TEE WITHOUT CARDIOVERSION N/A 06/15/2016   Procedure: TRANSESOPHAGEAL ECHOCARDIOGRAM (TEE);  Surgeon: Sanda Klein, MD;  Location: [facility] ENDOSCOPY;  Service: Cardiovascular;  Laterality: N/A;  .  TEE WITHOUT CARDIOVERSION N/A 07/11/2016   Procedure: TRANSESOPHAGEAL ECHOCARDIOGRAM (TEE);  Surgeon: Rexene Alberts, MD;  Location: [place];  Service: Open Heart Surgery;  Laterality: N/A;  . TEE WITHOUT CARDIOVERSION N/A 07/19/2016   Procedure: TRANSESOPHAGEAL ECHOCARDIOGRAM (TEE);  Surgeon: Lelon Perla, MD;  Location: [facility] ENDOSCOPY;  Service: Cardiovascular;  Laterality: N/A;  . TRANSESOPHAGEAL ECHOCARDIOGRAM  10/19/2005   NORMAL LV; MILD TO MODERATE AMOUNT OF SOFT ATHEROMATOUS PLAQUE OF THE THORACIC AORTA; THE LEFT ATRIUM IS MILDLY DILATED;LEFT ATRIAL APPENDAGE FUNCTION IS NORMAL;NO THROMBUS IDENTIFIED. SMALL PFO WITH RIGHT TO LEFT SHUNT     Current Outpatient Medications  Medication Sig Dispense Refill  . amoxicillin (AMOXIL) 500 MG tablet Take 4 tablets (2,000 mg total) by mouth See admin instructions. Take 2000 mg 1 hour prior to dental work 4 tablet 0  . aspirin EC 81 MG tablet Take 81 mg by mouth daily.     . carvedilol (COREG) 12.5 MG tablet Take 1.5 tablets (18.75 mg total) by mouth 2 (two) times daily with a meal.    . cholecalciferol (VITAMIN D3) 25 MCG (  1000 UNIT) tablet Take 1,000 Units by mouth 2 (two) times a week.     . clopidogrel (PLAVIX) 75 MG tablet Take 1 tablet (75 mg total) by mouth daily. 90 tablet 3  . digoxin (LANOXIN) 0.125 MG tablet Take 1/2 (one-half) tablet by mouth once daily 45 tablet 3  . eplerenone (INSPRA) 50 MG tablet Take 1 tablet by mouth once daily 30 tablet 11  . furosemide (LASIX) 20 MG tablet Take 20 mg by mouth daily as needed for fluid or edema.    . isosorbide mononitrate (IMDUR) 30 MG 24 hr tablet Take 30 mg by mouth daily as needed (anxiety).    [name] levothyroxine (SYNTHROID) 50 MCG tablet TAKE 1 TABLET BY MOUTH BEFORE BREAKFAST 30 tablet 5  . Melatonin 1 MG CAPS Take 2 mg by mouth daily as needed (sleep).     . neomycin-bacitracin-polymyxin (NEOSPORIN) ointment Apply 1 application topically daily as needed for wound care.    . nitroGLYCERIN  (NITROSTAT) 0.4 MG SL tablet Place 0.4 mg under the tongue every 5 (five) minutes as needed for chest pain.    . polyethylene glycol (MIRALAX / GLYCOLAX) packet Take 17 g by mouth daily as needed for mild constipation or moderate constipation.    . rosuvastatin (CRESTOR) 10 MG tablet Take 1 tablet (10 mg total) by mouth daily. 90 tablet 1  . sacubitril-valsartan (ENTRESTO) 97-103 MG Take 1 tablet by mouth 2 (two) times daily. 180 tablet 3  . sodium zirconium cyclosilicate (LOKELMA) 10 g PACK packet Take 10 g by mouth daily. 90 each 3  . Tetrahydrozoline HCl (VISINE OP) Place 1 drop into both eyes daily as needed (irritation).    . TRADJENTA 5 MG TABS tablet Take 1 tablet by mouth once daily 90 tablet 1   No current facility-administered medications for this visit.    Allergies:   Xanax [alprazolam]   Social History:  The patient  reports that he has never smoked. He has never used smokeless tobacco. He reports that he does not drink alcohol and does not use drugs.   Family History:  The patient's family history includes Heart attack in his father and mother; Heart disease in his father, maternal grandmother, and mother; Hypertension in his father and sister.    ROS:  Please see the history of present illness.   Otherwise, review of systems is positive for none.   All other systems are reviewed and negative.   PHYSICAL EXAM: VS:  BP 130/70   Pulse 84   Ht 6' (1.829 m)   Wt 196 lb 9.6 oz (89.2 kg)   SpO2 97%   BMI 26.66 kg/m  , BMI Body mass index is 26.66 kg/m. GEN: Well nourished, well developed, in no acute distress  HEENT: normal  Neck: no JVD, carotid bruits, or masses Cardiac: RRR; no murmurs, rubs, or gallops,no edema  Respiratory:  clear to auscultation bilaterally, normal work of breathing GI: soft, nontender, nondistended, + BS MS: no deformity or atrophy  Skin: warm and dry, device site well healed Neuro:  Strength and sensation are intact Psych: euthymic mood, full  affect  EKG:  EKG is not ordered today. Personal review of the ekg ordered 06/16/20 shows sinus rhythm, ventricular paced, PACs  Personal review of the device interrogation today. Results in [place]: 10/17/2019: ALT 15; B Natriuretic Peptide 4,254.3 12/25/2019: Platelets 151 01/08/2020: Hemoglobin 13.6; Hemoglobin 13.6 01/26/2020: TSH 3.643 06/16/2020: BUN 29; Creatinine, Ser 1.58; Potassium 4.8; Sodium 137  Lipid Panel     Component Value Date/Time   CHOL 122 10/17/2019 1225   CHOL 120 10/01/2018 0925   TRIG 78 10/17/2019 1225   HDL 37 (L) 10/17/2019 1225   HDL 31 (L) 10/01/2018 0925   CHOLHDL 3.3 10/17/2019 1225   VLDL 16 10/17/2019 1225   LDLCALC 69 10/17/2019 1225   LDLCALC 60 10/01/2018 0925     Wt Readings from Last 3 Encounters:  07/15/20 196 lb 9.6 oz (89.2 kg)  06/16/20 195 lb 6.4 oz (88.6 kg)  05/10/20 196 lb 6.4 oz (89.1 kg)      Other studies Reviewed: Additional studies/ records that were reviewed today include: TEE 07/19/16  Review of the above records today demonstrates:  - Left ventricle: The cavity size was severely dilated. Systolic   function was severely reduced. The estimated ejection fraction   was in the range of 20% to 25%. Diffuse hypokinesis. - Aortic valve: No evidence of vegetation. - Mitral valve: A bioprosthesis was present. - Left atrium: The atrium was moderately dilated. No evidence of   thrombus in the atrial cavity or appendage. - Right ventricle: Systolic function was moderately reduced. - Right atrium: No evidence of thrombus in the atrial cavity or   appendage. - Atrial septum: No defect or patent foramen ovale was identified. - Tricuspid valve: No evidence of vegetation. There was   mild-moderate regurgitation. - Pulmonic valve: No evidence of vegetation.  Cath 06/20/17 1. Severe native three-vessel coronary artery disease with total occlusion of the LAD, total occlusion of the ramus intermedius, and total  occlusion of the RCA 2. Continued patency of the LIMA to LAD with total occlusion of the apical LAD appearance unchanged from previous cath study 3. Patency of the most recent saphenous vein graft to PDA without significant stenosis present 4. Interval occlusion of the old saphenous vein graft PDA 5. Nonvisualization of the saphenous vein graft to diagonal, suspect total occlusion from nonselective imaging 6. Low LVEDP  ASSESSMENT AND PLAN:  1.  Typical atrial flutter: Status post ablation without obvious recurrence.  CHA2DS2-VASc of 4.  Currently off anticoagulation.    2.  Complete heart block: Status post Medtronic CRT-D.  Device functioning appropriately.  3.  Chronic systolic heart failure: Currently on optimal medical therapy.Status post Medtronic CRT-D.  Device functioning appropriately.  No changes.  4.  Mitral regurgitation: Status post bioprosthetic mitral valve.  Stable on most recent echo.  No changes.  5.  Coronary artery disease: Status post redo CABG.  Stable angina.  No changes.  6.  Nonsustained VT: Found on device interrogation.  Currently on carvedilol.  None on current interrogation  Current medicines are reviewed at length with the patient today.   The patient does not have concerns regarding his medicines.  The following changes were made today: None  Labs/ tests ordered today include:  No orders of the defined types were placed in this encounter.    Disposition:   FU with Vallarie Fei 12 months  Signed, Waylin Dorko Meredith Leeds, MD  07/15/2020 3:44 PM     [place] [address] [place] [address] [phone] (office) [phone] (fax)

## 2020-07-18 ENCOUNTER — Other Ambulatory Visit: Payer: Self-pay

## 2020-07-19 MED ORDER — CARVEDILOL 12.5 MG PO TABS: 18.7500 mg | ORAL_TABLET | Freq: Two times a day (BID) | ORAL | Status: DC

## 2020-07-22 ENCOUNTER — Other Ambulatory Visit: Payer: Self-pay | Admitting: *Deleted

## 2020-07-22 MED ORDER — CARVEDILOL 12.5 MG PO TABS: 18.7500 mg | ORAL_TABLET | Freq: Two times a day (BID) | ORAL | 3 refills | Status: DC

## 2020-07-27 ENCOUNTER — Ambulatory Visit (INDEPENDENT_AMBULATORY_CARE_PROVIDER_SITE_OTHER): Payer: Medicare Other

## 2020-07-27 DIAGNOSIS — I255 Ischemic cardiomyopathy: Secondary | ICD-10-CM | POA: Diagnosis not present

## 2020-07-27 LAB — CUP PACEART REMOTE DEVICE CHECK
Battery Remaining Longevity: 27 mo
Battery Voltage: 2.94 V
Brady Statistic AP VP Percent: 65.88 %
Brady Statistic AP VS Percent: 0.93 %
Brady Statistic AS VP Percent: 32.49 %
Brady Statistic AS VS Percent: 0.69 %
Brady Statistic RA Percent Paced: 65.75 %
Brady Statistic RV Percent Paced: 96.19 %
Date Time Interrogation Session: 20211026033324
HighPow Impedance: 57 Ohm
Implantable Lead Implant Date: 20171024
Implantable Lead Implant Date: 20171024
Implantable Lead Implant Date: 20171024
Implantable Lead Location: 753858
Implantable Lead Location: 753859
Implantable Lead Location: 753860
Implantable Lead Model: 4598
Implantable Lead Model: 5076
Implantable Pulse Generator Implant Date: 20171024
Lead Channel Impedance Value: 246.635
Lead Channel Impedance Value: 246.635
Lead Channel Impedance Value: 255.093
Lead Channel Impedance Value: 265.661
Lead Channel Impedance Value: 265.661
Lead Channel Impedance Value: 304 Ohm
Lead Channel Impedance Value: 399 Ohm
Lead Channel Impedance Value: 418 Ohm
Lead Channel Impedance Value: 475 Ohm
Lead Channel Impedance Value: 513 Ohm
Lead Channel Impedance Value: 513 Ohm
Lead Channel Impedance Value: 551 Ohm
Lead Channel Impedance Value: 551 Ohm
Lead Channel Impedance Value: 836 Ohm
Lead Channel Impedance Value: 836 Ohm
Lead Channel Impedance Value: 931 Ohm
Lead Channel Impedance Value: 931 Ohm
Lead Channel Impedance Value: 931 Ohm
Lead Channel Pacing Threshold Amplitude: 0.625 V
Lead Channel Pacing Threshold Amplitude: 0.75 V
Lead Channel Pacing Threshold Amplitude: 0.875 V
Lead Channel Pacing Threshold Pulse Width: 0.4 ms
Lead Channel Pacing Threshold Pulse Width: 0.4 ms
Lead Channel Pacing Threshold Pulse Width: 1 ms
Lead Channel Sensing Intrinsic Amplitude: 0.625 mV
Lead Channel Sensing Intrinsic Amplitude: 0.625 mV
Lead Channel Sensing Intrinsic Amplitude: 11 mV
Lead Channel Sensing Intrinsic Amplitude: 11 mV
Lead Channel Setting Pacing Amplitude: 1.25 V
Lead Channel Setting Pacing Amplitude: 2 V
Lead Channel Setting Pacing Amplitude: 2 V
Lead Channel Setting Pacing Pulse Width: 0.4 ms
Lead Channel Setting Pacing Pulse Width: 1 ms
Lead Channel Setting Sensing Sensitivity: 0.3 mV

## 2020-08-02 NOTE — Progress Notes (Signed)
Remote ICD transmission.   

## 2020-08-03 DIAGNOSIS — E559 Vitamin D deficiency, unspecified: Secondary | ICD-10-CM | POA: Diagnosis not present

## 2020-08-03 DIAGNOSIS — E039 Hypothyroidism, unspecified: Secondary | ICD-10-CM | POA: Diagnosis not present

## 2020-08-03 LAB — TSH: TSH: 4.14 (ref 0.41–5.90)

## 2020-08-03 LAB — VITAMIN D 25 HYDROXY (VIT D DEFICIENCY, FRACTURES): Vit D, 25-Hydroxy: 29.7

## 2020-08-05 ENCOUNTER — Other Ambulatory Visit: Payer: Self-pay | Admitting: Cardiovascular Disease

## 2020-08-11 ENCOUNTER — Ambulatory Visit (INDEPENDENT_AMBULATORY_CARE_PROVIDER_SITE_OTHER): Payer: Medicare Other | Admitting: "Endocrinology

## 2020-08-11 ENCOUNTER — Encounter: Payer: Self-pay | Admitting: "Endocrinology

## 2020-08-11 ENCOUNTER — Other Ambulatory Visit: Payer: Self-pay

## 2020-08-11 VITALS — BP 130/86 | HR 80 | Ht 72.0 in | Wt 200.2 lb

## 2020-08-11 DIAGNOSIS — I255 Ischemic cardiomyopathy: Secondary | ICD-10-CM

## 2020-08-11 DIAGNOSIS — E1122 Type 2 diabetes mellitus with diabetic chronic kidney disease: Secondary | ICD-10-CM | POA: Diagnosis not present

## 2020-08-11 DIAGNOSIS — E559 Vitamin D deficiency, unspecified: Secondary | ICD-10-CM | POA: Diagnosis not present

## 2020-08-11 DIAGNOSIS — E782 Mixed hyperlipidemia: Secondary | ICD-10-CM

## 2020-08-11 DIAGNOSIS — N1831 Chronic kidney disease, stage 3a: Secondary | ICD-10-CM

## 2020-08-11 DIAGNOSIS — E039 Hypothyroidism, unspecified: Secondary | ICD-10-CM

## 2020-08-11 LAB — POCT GLYCOSYLATED HEMOGLOBIN (HGB A1C): Hemoglobin A1C: 6.3 % — AB (ref 4.0–5.6)

## 2020-08-11 MED ORDER — LEVOTHYROXINE SODIUM 50 MCG PO TABS: ORAL_TABLET | ORAL | 1 refills | Status: DC

## 2020-08-11 NOTE — Progress Notes (Signed)
08/11/2020           Endocrinology follow-up note   Subjective:    Patient ID: Christian Bowman, male    DOB: July 18, 1946,    Past Medical History:  Diagnosis Date  . AICD (automatic cardioverter/defibrillator) present    medtronic-   DR. CROITORU , DR. Aundra Dubin   . Anginal pain ([place])    cp sat 08/11/17  . Anxiety   . CAD (coronary artery disease)   . CHF (congestive heart failure) ([place])   . Complication of anesthesia    took awhile to wake up   . Coronary artery disease involving coronary bypass graft   . Cyst of neck    right side  . DM2 (diabetes mellitus, type 2) ([place]) 08/26/2013  . Dyspnea   . Heart attack ([place])    "not sure when" (08/20/2017)  . HTN (hypertension) 08/26/2013  . Hyperlipidemia 08/26/2013  . Hypothyroidism   . Left main coronary artery disease   . Left renal artery stenosis ([place])    Genesis 6x12 stent 2007  . Obesity (BMI 30.0-34.9) 08/26/2013  . Postoperative atrial fibrillation ([place]) 10/15/2005  . Presence of permanent cardiac pacemaker   . S/P CABG x 4 10/13/2005   LIMA to LAD, SVG to intermediate branch, sequential SVG to PDA and RPL branch, EVH via right thigh  . S/P mitral valve replacement with bioprosthetic valve 07/11/2016   31 mm [facility] Mitral bovine bioprosthetic tissue valve  . S/P redo CABG x 2 07/11/2016   SVG to PDA and SVG to Intermediate Branch, EVH via left thigh   Past Surgical History:  Procedure Laterality Date  . A-FLUTTER ABLATION N/A 05/11/2017   Procedure: A-Flutter Ablation;  Surgeon: Constance Haw, MD;  Location: [place] [facility];  Service: Cardiovascular;  Laterality: N/A;  . CARDIAC CATHETERIZATION N/A 06/21/2016   Procedure: Right/Left Heart Cath and Coronary/Graft Angiography;  Surgeon: Sherren Mocha, MD;  Location: [place] [facility];  Service: Cardiovascular;  Laterality: N/A;  . CARDIAC VALVE REPLACEMENT    . CARDIOVERSION N/A 07/19/2016   Procedure: CARDIOVERSION;  Surgeon: Lelon Perla, MD;   Location: [facility] ENDOSCOPY;  Service: Cardiovascular;  Laterality: N/A;  . CARDIOVERSION N/A 09/08/2016   Procedure: CARDIOVERSION;  Surgeon: Fay Records, MD;  Location: [place];  Service: Cardiovascular;  Laterality: N/A;  . CORONARY ANGIOPLASTY     STENT 2016  [place]     DES in SVG to right coronary artery system  . CORONARY ARTERY BYPASS GRAFT  10/13/2005   LIMA to LAD, SVG to intermediate branch, sequential SVG to PDA and RPL  . CORONARY ARTERY BYPASS GRAFT N/A 07/11/2016   Procedure: REDO CORONARY ARTERY BYPASS GRAFTING (CABG) x two using left leg greater saphenous vein harvested endoscopically-SVG to PDA -SVG to RAMUS INTERMEDIATE;  Surgeon: Rexene Alberts, MD;  Location: [place];  Service: Open Heart Surgery;  Laterality: N/A;  . CORONARY ARTERY BYPASS GRAFT N/A 07/11/2016   Procedure: Re-exploration (CABG) for post op bleeding,;  Surgeon: Rexene Alberts, MD;  Location: [facility];  Service: Open Heart Surgery;  Laterality: N/A;  . EP IMPLANTABLE DEVICE N/A 07/25/2016   Procedure: BiV ICD Insertion CRT-D;  Surgeon: Will Meredith Leeds, MD;  Location: [facility];  Service: Cardiovascular;  Laterality: N/A;  . INGUINAL HERNIA REPAIR Left 08/20/2017  . INGUINAL HERNIA REPAIR Left 08/20/2017   Procedure: OPEN LEFT INGUINAL HERNIA REPAIR;  Surgeon: Alphonsa Overall,  MD;  Location: [facility];  Service: General;  Laterality: Left;  . LEFT HEART CATH AND CORS/GRAFTS ANGIOGRAPHY N/A 12/18/2016   Procedure: Left Heart Cath and Cors/Grafts Angiography;  Surgeon: Sherren Mocha, MD;  Location: [place] [facility];  Service: Cardiovascular;  Laterality: N/A;  . LEFT HEART CATH AND CORS/GRAFTS ANGIOGRAPHY N/A 08/22/2017   Procedure: LEFT HEART CATH AND CORS/GRAFTS ANGIOGRAPHY;  Surgeon: Larey Dresser, MD;  Location: [facility];  Service: Cardiovascular;  Laterality: N/A;  . MITRAL VALVE REPLACEMENT N/A 07/11/2016   Procedure: MITRAL VALVE  (MV) REPLACEMENT;  Surgeon: Rexene Alberts, MD;  Location: [facility];  Service: Open Heart Surgery;  Laterality: N/A;  . MYOCARDICAL PERFUSION  10/09/2007   NORMAL PERFUSION IN ALL REGIONS;NO EVIDENCE OF INDUCIBLE ISCHEMIA;POST STRESS EF% 66  . RENAL ARTERY STENT Right 2007  . RENAL DOPPLER  03/28/2010   RIGHT RA-NORMAL;LEFT PROXIMAL RA AT STENT-PATENT WITH NO EVIDENCE OF SIGN DIAMETER REDUCTION. R & L KIDNEYS: EQUAL IN SIZE,SYMMETRICAL IN SHAPE.  [name] RIGHT HEART CATH N/A 01/08/2020   Procedure: RIGHT HEART CATH;  Surgeon: Larey Dresser, MD;  Location: [place] [facility];  Service: Cardiovascular;  Laterality: N/A;  . TEE WITHOUT CARDIOVERSION N/A 06/15/2016   Procedure: TRANSESOPHAGEAL ECHOCARDIOGRAM (TEE);  Surgeon: Sanda Klein, MD;  Location: [facility] END[facility]COPY;  Service: Cardiovascular;  Laterality: N/A;  . TEE WITHOUT CARDIOVERSION N/A 07/11/2016   Procedure: TRANSESOPHAGEAL ECHOCARDIOGRAM (TEE);  Surgeon: Rexene Alberts, MD;  Location: [facility];  Service: Open Heart Surgery;  Laterality: N/A;  . TEE WITHOUT CARDIOVERSION N/A 07/19/2016   Procedure: TRANSESOPHAGEAL ECHOCARDIOGRAM (TEE);  Surgeon: Lelon Perla, MD;  Location: [facility] ENDOSCOPY;  Service: Cardiovascular;  Laterality: N/A;  . TRANSESOPHAGEAL ECHOCARDIOGRAM  10/19/2005   NORMAL LV; MILD TO MODERATE AMOUNT OF SOFT ATHEROMATOUS PLAQUE OF THE THORACIC AORTA; THE LEFT ATRIUM IS MILDLY DILATED;LEFT ATRIAL APPENDAGE FUNCTION IS NORMAL;NO THROMBUS IDENTIFIED. SMALL PFO WITH RIGHT TO LEFT SHUNT   Social History   Socioeconomic History  . Marital status: Married    Spouse name: Not on file  . Number of children: 0  . Years of education: 51  . Highest education level: Not on file  Occupational History  . Occupation: retired  Tobacco Use  . Smoking status: Never Smoker  . Smokeless tobacco: Never Used  Vaping Use  . Vaping Use: Never used  Substance and Sexual Activity  . Alcohol use: No  . Drug use: No  . Sexual activity: Never  Other  Topics Concern  . Not on file  Social History Narrative   Right handed   Two story home   Drinks half and half coffee   Social Determinants of Health   Financial Resource Strain:   . Difficulty of Paying Living Expenses: Not on file  Food Insecurity:   . Worried About Charity fundraiser in the Last Year: Not on file  . Ran Out of Food in the Last Year: Not on file  Transportation Needs:   . Lack of Transportation (Medical): Not on file  . Lack of Transportation (Non-Medical): Not on file  Physical Activity:   . Days of Exercise per Week: Not on file  . Minutes of Exercise per Session: Not on file  Stress:   . Feeling of Stress : Not on file  Social Connections:   . Frequency of Communication with Friends and Family: Not on file  . Frequency of Social Gatherings with Friends and Family: Not on file  .  Attends Religious Services: Not on file  . Active Member of Clubs or Organizations: Not on file  . Attends Archivist Meetings: Not on file  . Marital Status: Not on file   Outpatient Encounter Medications as of 08/11/2020  Medication Sig  . amoxicillin (AMOXIL) 500 MG tablet Take 4 tablets (2,000 mg total) by mouth See admin instructions. Take 2000 mg 1 hour prior to dental work  . aspirin EC 81 MG tablet Take 81 mg by mouth daily.   . carvedilol (COREG) 12.5 MG tablet Take 1.5 tablets (18.75 mg total) by mouth 2 (two) times daily with a meal.  . cholecalciferol (VITAMIN D3) 25 MCG (1000 UNIT) tablet Take 2,000 Units by mouth daily with breakfast.  . clopidogrel (PLAVIX) 75 MG tablet Take 1 tablet (75 mg total) by mouth daily.  . digoxin (LANOXIN) 0.125 MG tablet Take 1/2 (one-half) tablet by mouth once daily  . eplerenone (INSPRA) 50 MG tablet Take 1 tablet by mouth once daily  . furosemide (LASIX) 20 MG tablet Take 20 mg by mouth daily as needed for fluid or edema.  . isosorbide mononitrate (IMDUR) 30 MG 24 hr tablet Take 30 mg by mouth daily as needed (anxiety).  [name]  levothyroxine (SYNTHROID) 50 MCG tablet TAKE 1 TABLET BY MOUTH BEFORE BREAKFAST  . Melatonin 1 MG CAPS Take 2 mg by mouth daily as needed (sleep).   . neomycin-bacitracin-polymyxin (NEOSPORIN) ointment Apply 1 application topically daily as needed for wound care.  . nitroGLYCERIN (NITROSTAT) 0.4 MG SL tablet Place 0.4 mg under the tongue every 5 (five) minutes as needed for chest pain.  . polyethylene glycol (MIRALAX / GLYCOLAX) packet Take 17 g by mouth daily as needed for mild constipation or moderate constipation.  . rosuvastatin (CRESTOR) 10 MG tablet Take 1 tablet (10 mg total) by mouth daily.  . sacubitril-valsartan (ENTRESTO) 97-103 MG Take 1 tablet by mouth 2 (two) times daily.  . sodium zirconium cyclosilicate (LOKELMA) 10 g PACK packet Take 10 g by mouth daily.  . Tetrahydrozoline HCl (VISINE OP) Place 1 drop into both eyes daily as needed (irritation).  . TRADJENTA 5 MG TABS tablet Take 1 tablet by mouth once daily  . [DISCONTINUED] levothyroxine (SYNTHROID) 50 MCG tablet TAKE 1 TABLET BY MOUTH BEFORE BREAKFAST   No facility-administered encounter medications on file as of 08/11/2020.   ALLERGIES: Allergies  Allergen Reactions  . Xanax [Alprazolam] Other (See Comments)    Pt feels very weak, tired and feels paralyzed     VACCINATION STATUS: Immunization History  Administered Date(s) Administered  . Fluad Quad(high Dose 65+) 07/28/2019  . Influenza,inj,Quad PF,6+ Mos 08/01/2016, 07/11/2017  . Pneumococcal Polysaccharide-23 07/02/2015    Diabetes He presents for his follow-up diabetic visit. He has type 2 diabetes mellitus. Onset time: He was diagnosed at approximate age of 42 years. His disease course has been stable. There are no hypoglycemic associated symptoms. Pertinent negatives for hypoglycemia include no confusion, headaches, pallor or seizures. There are no diabetic associated symptoms. Pertinent negatives for diabetes include no chest pain, no fatigue, no polydipsia,  no polyphagia, no polyuria and no weakness. There are no hypoglycemic complications. Symptoms are improving. Diabetic complications include heart disease. Risk factors for coronary artery disease include diabetes mellitus, dyslipidemia, hypertension, male sex and sedentary lifestyle. He is compliant with treatment most of the time. His weight is fluctuating minimally. He participates in exercise intermittently. (His point-of-care A1c 6.4%, improving from 7.2%.  He denies hypoglycemia.) An ACE inhibitor/angiotensin II receptor  blocker is being taken.  Hyperlipidemia This is a chronic problem. The current episode started more than 1 year ago. Exacerbating diseases include diabetes. Pertinent negatives include no chest pain, myalgias or shortness of breath. Current antihyperlipidemic treatment includes statins. Risk factors for coronary artery disease include diabetes mellitus, dyslipidemia, hypertension and male sex.  Hypertension This is a chronic problem. The current episode started more than 1 year ago. Pertinent negatives include no chest pain, headaches, neck pain, palpitations or shortness of breath. Risk factors for coronary artery disease include diabetes mellitus and dyslipidemia. Hypertensive end-organ damage includes CAD/MI. Identifiable causes of hypertension include a thyroid problem.  Thyroid Problem Presents for follow-up visit. Patient reports no constipation, diarrhea, fatigue or palpitations. (Remains on levothyroxine 75 mcg p.o. nightly.  He is compliant.  He has no new complaints today.) The symptoms have been stable. His past medical history is significant for diabetes and hyperlipidemia.    Review of systems  Constitutional: + Steady body weight 200 pounds,   Body mass index is 27.15 kg/m. , no fatigue, no subjective hyperthermia, no subjective hypothermia Eyes: no blurry vision, no xerophthalmia ENT: no sore throat, no nodules palpated in throat, no dysphagia/odynophagia, no  hoarseness Cardiovascular: no Chest Pain, no Shortness of Breath, no palpitations, no leg swelling Respiratory: no cough, no shortness of breath Gastrointestinal: no Nausea/Vomiting/Diarhhea Musculoskeletal: no muscle/joint aches Skin: no rashes, no hyperemia Neurological: no tremors, no numbness, no tingling, no dizziness Psychiatric: no depression, no anxiety   Objective:    BP 130/86   Pulse 80   Ht 6' (1.829 m)   Wt 200 lb 3.2 oz (90.8 kg)   BMI 27.15 kg/m   Wt Readings from Last 3 Encounters:  08/11/20 200 lb 3.2 oz (90.8 kg)  07/15/20 196 lb 9.6 oz (89.2 kg)  06/16/20 195 lb 6.4 oz (88.6 kg)     Physical Exam- Limited  Constitutional:  Body mass index is 27.15 kg/m. , not in acute distress, normal state of mind Eyes:  EOMI, no exophthalmos Neck: Supple Thyroid: No gross goiter Respiratory: Adequate breathing efforts Musculoskeletal: no gross deformities, strength intact in all four extremities, no gross restriction of joint movements Skin:  no rashes, no hyperemia Neurological: no tremor with outstretched hands,   Chemistry (most recent): Lab Results  Component Value Date   NA 137 06/16/2020   K 4.8 06/16/2020   CL 105 06/16/2020   CO2 24 06/16/2020   BUN 29 (H) 06/16/2020   CREATININE 1.58 (H) 06/16/2020     Lipid Panel     Component Value Date/Time   CHOL 122 10/17/2019 1225   CHOL 120 10/01/2018 0925   TRIG 78 10/17/2019 1225   HDL 37 (L) 10/17/2019 1225   HDL 31 (L) 10/01/2018 0925   CHOLHDL 3.3 10/17/2019 1225   VLDL 16 10/17/2019 1225   LDLCALC 69 10/17/2019 1225   LDLCALC 60 10/01/2018 0925   LABVLDL 29 10/01/2018 0925    A1c today August 11, 2020 was 6.3%.  Assessment & Plan:   1. Type 2 diabetes mellitus with other circulatory complication ([place]), stage 3 CKD   His diabetes is  complicated by coronary artery disease status post coronary artery bypass graft and recent ACS and patient remains at a high risk for more acute and chronic  complications of diabetes which include CAD, CVA, CKD, retinopathy, and neuropathy. These are all discussed in detail with the patient.  His point-of-care A1c 6.3%, stable from last visit.    His most recent  CMP shows improving renal function, does have history of stage 2-3 renal insufficiency. - I have re-counseled the patient on diet management  by adopting a carbohydrate restricted / protein rich  Diet.  - he  admits there is a room for improvement in his diet and drink choices. -  Suggestion is made for him to avoid simple carbohydrates  from his diet including Cakes, Sweet Desserts / Pastries, Ice Cream, Soda (diet and regular), Sweet Tea, Candies, Chips, Cookies, Sweet Pastries,  Store Bought Juices, Alcohol in Excess of  1-2 drinks a day, Artificial Sweeteners, Coffee Creamer, and "Sugar-free" Products. This will help patient to have stable blood glucose profile and potentially avoid unintended weight gain.  - Patient is advised to stick to a routine mealtimes to eat 3 meals  a day and avoid unnecessary snacks ( to snack only to correct hypoglycemia).   - I have approached patient with the following individualized plan to manage diabetes and patient agrees.  -He has benefited from reinitiation of Tradjenta.  He is advised to continue on Tradjenta 5 mg p.o. daily at bedtime.  Based on his presentation with controlled glycemia to target, he will not need additional intervention with insulin nor SGLT2 inhibitors from his diabetes point of view.     - Patient specific target  for A1c; LDL, HDL, Triglycerides, were discussed in detail.  2) BP/HTN -His blood pressure is controlled to target.   Reportedly he runs low blood pressure at home and would not tolerate any additional therapy. I advised him to continue his current medications including beta blocker, spironolactone and,  Lasix as needed.   3) Lipids/HPL: His most recent lipid panel showed controlled LDL at 69.  He is advised to continue  Crestor 10 mg p.o. nightly.  .  Side effects and precautions discussed with him.    4)  Weight/Diet: His BMI is 27.1 -a candidate for some weight loss.  CDE consult in progress, exercise, and carbohydrates information provided.  5) hypothyroidism: - This is related to his therapy with amiodarone. His previsit thyroid function tests are consistent with appropriate replacement.  He is advised to continue levothyroxine 50 mcg p.o. daily before breakfast.    - We discussed about the correct intake of his thyroid hormone, on empty stomach at fasting, with water, separated by at least 30 minutes from breakfast and other medications,  and separated by more than 4 hours from calcium, iron, multivitamins, acid reflux medications (PPIs). -Patient is made aware of the fact that thyroid hormone replacement is needed for life, dose to be adjusted by periodic monitoring of thyroid function tests.    6) Chronic Care/Health Maintenance:  -Patient is  on ACEI/ARB and Statin medications and encouraged to continue to follow up with Ophthalmology, Podiatrist at least yearly or according to recommendations, and advised to  stay away from smoking. I have recommended yearly flu vaccine and pneumonia vaccination at least every 5 years, and  sleep for at least 7 hours a day.  I advised patient to maintain close follow up with his cardiologist and his  PCP for primary care needs.   - Time spent on this patient care encounter:  35 min, of which > 50% was spent in  counseling and the rest reviewing his blood glucose logs , discussing his hypoglycemia and hyperglycemia episodes, reviewing his current and  previous labs / studies  ( including abstraction from other facilities) and medications  doses and developing a  long term treatment plan and  documenting his care.   Please refer to Patient Instructions for Blood Glucose Monitoring and Insulin/Medications Dosing Guide"  in media tab for additional information. Please  also  refer to " Patient Self Inventory" in the Media  tab for reviewed elements of pertinent patient history.  Christian Bowman participated in the discussions, expressed understanding, and voiced agreement with the above plans.  All questions were answered to his satisfaction. he is encouraged to contact clinic should he have any questions or concerns prior to his return visit.    Follow up plan: Return in about 6 months (around 02/08/2021) for F/U with Pre-visit Labs, A1c -[place].  Glade Lloyd, MD Phone: [phone]  Fax: [phone]   This note was partially dictated with voice recognition software. Similar sounding words can be transcribed inadequately or may not  be corrected upon review.  08/11/2020, 11:17 AM

## 2020-08-13 DIAGNOSIS — Z23 Encounter for immunization: Secondary | ICD-10-CM | POA: Diagnosis not present

## 2020-08-28 ENCOUNTER — Other Ambulatory Visit (HOSPITAL_COMMUNITY): Payer: Self-pay

## 2020-08-31 MED ORDER — AMOXICILLIN 500 MG PO TABS: 2000.0000 mg | ORAL_TABLET | ORAL | 0 refills | Status: DC

## 2020-09-08 DIAGNOSIS — Z6827 Body mass index (BMI) 27.0-27.9, adult: Secondary | ICD-10-CM | POA: Diagnosis not present

## 2020-09-08 DIAGNOSIS — Z0001 Encounter for general adult medical examination with abnormal findings: Secondary | ICD-10-CM | POA: Diagnosis not present

## 2020-09-08 DIAGNOSIS — E8881 Metabolic syndrome: Secondary | ICD-10-CM | POA: Diagnosis not present

## 2020-09-08 DIAGNOSIS — I5030 Unspecified diastolic (congestive) heart failure: Secondary | ICD-10-CM | POA: Diagnosis not present

## 2020-09-08 DIAGNOSIS — E1165 Type 2 diabetes mellitus with hyperglycemia: Secondary | ICD-10-CM | POA: Diagnosis not present

## 2020-09-08 DIAGNOSIS — I2510 Atherosclerotic heart disease of native coronary artery without angina pectoris: Secondary | ICD-10-CM | POA: Diagnosis not present

## 2020-09-08 DIAGNOSIS — Z1331 Encounter for screening for depression: Secondary | ICD-10-CM | POA: Diagnosis not present

## 2020-09-11 ENCOUNTER — Other Ambulatory Visit: Payer: Self-pay | Admitting: Cardiology

## 2020-09-13 ENCOUNTER — Other Ambulatory Visit: Payer: Self-pay

## 2020-09-13 ENCOUNTER — Telehealth: Payer: Self-pay

## 2020-09-13 DIAGNOSIS — E1122 Type 2 diabetes mellitus with diabetic chronic kidney disease: Secondary | ICD-10-CM

## 2020-09-13 DIAGNOSIS — N1831 Chronic kidney disease, stage 3a: Secondary | ICD-10-CM

## 2020-09-13 MED ORDER — LINAGLIPTIN 5 MG PO TABS: 5.0000 mg | ORAL_TABLET | Freq: Every day | ORAL | 1 refills | Status: DC

## 2020-09-13 NOTE — Telephone Encounter (Signed)
Patient wants a RX for TRADJENTA 5 MG TABS tablet on file at [facility] for 90 days. He does not a refill right now just needs it on file

## 2020-09-13 NOTE — Telephone Encounter (Signed)
Sent in

## 2020-09-17 ENCOUNTER — Ambulatory Visit (HOSPITAL_COMMUNITY)
Admission: RE | Admit: 2020-09-17 | Discharge: 2020-09-17 | Disposition: A | Discharge status: Home / Self Care | Payer: Medicare Other | Source: Ambulatory Visit | Attending: Cardiology | Admitting: Cardiology

## 2020-09-17 ENCOUNTER — Encounter (HOSPITAL_COMMUNITY): Payer: Self-pay | Admitting: Cardiology

## 2020-09-17 ENCOUNTER — Other Ambulatory Visit: Payer: Self-pay

## 2020-09-17 VITALS — BP 150/88 | HR 78 | Wt 199.4 lb

## 2020-09-17 DIAGNOSIS — Z8774 Personal history of (corrected) congenital malformations of heart and circulatory system: Secondary | ICD-10-CM | POA: Diagnosis not present

## 2020-09-17 DIAGNOSIS — Z8673 Personal history of transient ischemic attack (TIA), and cerebral infarction without residual deficits: Secondary | ICD-10-CM | POA: Insufficient documentation

## 2020-09-17 DIAGNOSIS — R1310 Dysphagia, unspecified: Secondary | ICD-10-CM | POA: Diagnosis not present

## 2020-09-17 DIAGNOSIS — E1122 Type 2 diabetes mellitus with diabetic chronic kidney disease: Secondary | ICD-10-CM | POA: Insufficient documentation

## 2020-09-17 DIAGNOSIS — I255 Ischemic cardiomyopathy: Secondary | ICD-10-CM | POA: Insufficient documentation

## 2020-09-17 DIAGNOSIS — Z953 Presence of xenogenic heart valve: Secondary | ICD-10-CM | POA: Insufficient documentation

## 2020-09-17 DIAGNOSIS — D170 Benign lipomatous neoplasm of skin and subcutaneous tissue of head, face and neck: Secondary | ICD-10-CM | POA: Insufficient documentation

## 2020-09-17 DIAGNOSIS — I252 Old myocardial infarction: Secondary | ICD-10-CM | POA: Diagnosis not present

## 2020-09-17 DIAGNOSIS — R0602 Shortness of breath: Secondary | ICD-10-CM | POA: Insufficient documentation

## 2020-09-17 DIAGNOSIS — Z7902 Long term (current) use of antithrombotics/antiplatelets: Secondary | ICD-10-CM | POA: Diagnosis not present

## 2020-09-17 DIAGNOSIS — R42 Dizziness and giddiness: Secondary | ICD-10-CM | POA: Diagnosis not present

## 2020-09-17 DIAGNOSIS — I5022 Chronic systolic (congestive) heart failure: Secondary | ICD-10-CM | POA: Diagnosis not present

## 2020-09-17 DIAGNOSIS — Z8249 Family history of ischemic heart disease and other diseases of the circulatory system: Secondary | ICD-10-CM | POA: Insufficient documentation

## 2020-09-17 DIAGNOSIS — Z7984 Long term (current) use of oral hypoglycemic drugs: Secondary | ICD-10-CM | POA: Diagnosis not present

## 2020-09-17 DIAGNOSIS — I340 Nonrheumatic mitral (valve) insufficiency: Secondary | ICD-10-CM | POA: Diagnosis not present

## 2020-09-17 DIAGNOSIS — I4892 Unspecified atrial flutter: Secondary | ICD-10-CM | POA: Insufficient documentation

## 2020-09-17 DIAGNOSIS — I2510 Atherosclerotic heart disease of native coronary artery without angina pectoris: Secondary | ICD-10-CM | POA: Insufficient documentation

## 2020-09-17 DIAGNOSIS — Z79899 Other long term (current) drug therapy: Secondary | ICD-10-CM | POA: Diagnosis not present

## 2020-09-17 DIAGNOSIS — R079 Chest pain, unspecified: Secondary | ICD-10-CM | POA: Diagnosis not present

## 2020-09-17 DIAGNOSIS — N183 Chronic kidney disease, stage 3 unspecified: Secondary | ICD-10-CM | POA: Diagnosis not present

## 2020-09-17 DIAGNOSIS — Z7982 Long term (current) use of aspirin: Secondary | ICD-10-CM | POA: Diagnosis not present

## 2020-09-17 MED ORDER — CARVEDILOL 12.5 MG PO TABS: ORAL_TABLET | ORAL | 3 refills | Status: DC

## 2020-09-17 NOTE — Patient Instructions (Signed)
Change Carvedilol to 12.5 mg (1 tab) in AM and 18.75 mg (1 & 1/2 tabs) in PM  We have provided you a prescription to have lab work done in [place], please have this done soon  Your physician recommends that you schedule a follow-up appointment in: 3 months  If you have any questions or concerns before your next appointment please send [place] a message through [place] or call our office at [phone].    TO LEAVE A MESSAGE FOR THE NURSE SELECT OPTION 2, PLEASE LEAVE A MESSAGE INCLUDING:  YOUR NAME  DATE OF BIRTH  CALL BACK NUMBER  REASON FOR CALL**this is important as we prioritize the call backs  YOU WILL RECEIVE A CALL BACK THE SAME DAY AS LONG AS YOU CALL BEFORE 4:00 PM  At the [facility], you and your health needs are our priority. As part of our continuing mission to provide you with exceptional heart care, we have created designated Provider Care Teams. These Care Teams include your primary Cardiologist (physician) and Advanced Practice Providers (APPs- Physician Assistants and Nurse Practitioners) who all work together to provide you with the care you need, when you need it.   You may see any of the following providers on your designated Care Team at your next follow up:  Dr Glori Bickers  Dr Haynes Kerns, NP  Lyda Jester, [place]  Audry Riles, PharmD   Please be sure to bring in all your medications bottles to every appointment.

## 2020-09-19 NOTE — Progress Notes (Signed)
Date:  09/19/2020   ID:  Christian Bowman, DOB 1946-07-09, MRN [id number]   Provider location: [facility] Type of Visit: Established patient   PCP:  Sharilyn Sites, MD  Cardiologist: Dr. Sallyanne Kuster  HF Cardiologist:  Loralie Champagne, MD   History of Present Illness: Christian Bowman is a 74 y.o. male with history of CAD s/p CABG in 2007 then redo CABG in 10/17 with mitral valve replacement.  He was admitted in 10/17 for redo CABG with SVG-PDA and SVG-ramus.  He also had mitral valve replacement with a bioprosthetic valve because of infarct-related mitral regurgitation.  Post-operative course was complicated by CHF requiring diuresis.  He also had atrial flutter and required DCCV.  Due to complete heart block, he later got a CRT-D system.    At a prior visit, he was in atrial flutter.  He saw Dr. Curt Bears, it was decided to arrange for DCCV with ablation down the road when PPM leads have been in longer.  He had successful DCCV in 12/17 and is in NSR today.   He was admitted in 3/18 with NSTEMI and chest pain. TnI only 0.5.  LHC showed occlusion of SVG-PDA from CABG#1 to be the likely culprit.  However, SVG-PDA from CABG#2 was patent.  No intervention.  Echo in 3/18 from [place] showed EF 30-35%, stable bioprosthetic mitral valve.   He had atrial flutter ablation in 8/18.  He is in NSR today and is off amiodarone.    In 11/18, he had an inguinal hernia repair. Post-op, he had an NSTEMI.  LHC showed occlusion of a PLV branch that had been backfilled by SVG-PDA (prior cath had shown severe diffuse disease in the PLV).  Echo in 11/18 showed EF 20-25% with normal bioprosthetic mitral valve.    With no recurrence of atrial arrhythmias, he is now off anticoagulation.   Echo in 7/20 showed EF 25-30%, bioprosthetic MV with no MR and mean gradient 5 mmHg, normal RV.    In 1/21, he developed right hand weakness and dysarthria concerning for CVA.  He went to the ER in [place] and  had tPA.  Symptoms resolved.  Atrial fibrillation was not noted per his wife's report.  CTA head/neck showed no significant carotid stenosis.  He was started on Plavix 75 mg daily and sent home.    CPX (3/21) showed severe functional impairment due to HF.  Echo showed EF 20-25%, normal RV, normally functioning bioprosthetic mitral valve.  RHC in 4/21 showed low filling pressures and low cardiac index (2.1 Fick, 1.9 thermo).  He saw Dr. Orvan Seen to discuss possible lateral thoracotomy access LVAD. He decided against LVAD placement.   He returns for followup of CHF.  He did not tolerate increasing Coreg to 25 mg bid recently and is back on 18.75 mg bid. He notes that SBP goes down to 90s after taking his am meds and he gets lightheaded with standing at times.  BP rises later in the day.  Chest pain only with stress, no change to pattern.  No dyspnea walking on flat ground.  Weight is up about 4 lbs. BP is high today but this is unusual for him.  He has a large lipoma on right next, feels like it has slowly grown.  He is starting to develop mild dysphagia at times (not constantly).     [employer]tr[employer] device interrogation: Stable thoracic impedance, no VT/AF.   ECG (personally reviewed): A-V dual paced  Labs (11/17):  K 4.8, creatinine 1.89, hgb 12.3, digoxin 0.9, LFTs normal, TSH 8.235 (mild increase), free T3 low, free T4 normal, LFTs normal.  Labs (12/17): K 4.5, creatinine 1.7, BNP 3909 Labs (3/18): K 3.9, creatinine 1.48, hgb 11.3 Labs (4/18): digoxin 0.5, LFTs normal Labs (7/18): K 5.1, creatinine 1.78, LDL 63, HDL 29, TSH elevated Labs (11/18): K 4.5, creatinine 1.86, hgb 13.6 Labs (12/18): K 5, creatinine 1.88 Labs (1/19): digoxin 0.3, K 4.7, creatinine 1.83 Labs (6/19): LDL 70, HDL 32, K 5.1, creatinine 1.88 Labs (12/19): LDL 60, HDL 31 Labs (1/20): K 5.1, creatinine 1.64 Labs (4/20): K 4.5, creatinine 1.77 Labs (8/20): K 4.6, creatinine 1.5 Labs (1/21): K 4.5, creatinine 1.58, LDL 69 Labs  (2/21): K 4.5, creatinine 1.66 Labs (4/21): K 4.1, creatinine 1.63, digoxin 0.4 Labs (5/21): K 4.4, creatinine 1.51 Labs (6/21): K 4.4, creatinine 1.51, digoxin 0.3  PMH: 1. CAD: CABG 2007.  - LHC (9/17) with patent LIMA-LAD, totally occluded SVG-D, severe stenosis in SVG-PDA.  - Redo CABG 10/17 with SVG-PDA, SVG-ramus and mitral valve replacement.  - NSTEMI 3/18.  LHC with culprit lesion likely occlusion of SVG-PDA from CABG#1.  Patent SVG-PDA from CABG#2.  No intervention.  - NSTEMI 11/18 post-op inguinal hernia repair.  LHC with occlusion of a PLV branch that had been backfilled by SVG-PDA (prior cath had shown severe diffuse disease in the PLV).  2. Mitral regurgitation: Ischemic MR, mitral valve replacement was done in 10/17 (bioprosthetic). 3. Complete heart block: Post-op in 10/17.  Medtronic CRT-D placed.  4. Atrial flutter: DCCV 10/17 and again in 12/17.  - Ablation 8/18, now off amiodarone.  5. Type II diabetes 6. Hyperlipidemia 7. Chronic systolic CHF: Ischemic cardiomyopathy.   - TEE (10/17): EF 20-25%, severe LV dilation - Echo (3/18, [place]): EF 30-35%, bioprosthetic mitral valve with mean gradient 5 mmHg.  - Echo (11/18): EF 20-25%, moderate LV dilation, normal bioprosthetic mitral valve.  - Echo (7/20): EF 25-30%, moderate LV dilation, inferior/inferolateral AK, bioprosthetic MV with mean gradient 5 and no MR, normal RV.  - Painful gynecomastia with spironolactone.  - Echo (1/21): EF < 20%, normal RV size and systolic function, bioprosthetic mitral valve with mild MR, mean gradient 2 mmHg.  - CPX (3/21): peak VO2 11.2, VE/VCO2 slope 56, RER 1.21.  - Echo (3/21): EF 20-25%, no LV thrombus, RV normal, bioprosthetic mitral valve appear normal.  - RHC (4/21): mean RA 3, PA 25/7, mean PCWP 11, CI 2.1 (Fick), 1.9 (thermo).  8. Hypothyroidism 9. CVA 1/21: CTA head/neck with no carotid stenosis.   Current Outpatient Medications  Medication Sig Dispense Refill   amoxicillin  (AMOXIL) 500 MG tablet Take 4 tablets (2,000 mg total) by mouth See admin instructions. Take 2000 mg 1 hour prior to dental work 4 tablet 0   aspirin EC 81 MG tablet Take 81 mg by mouth daily.      cholecalciferol (VITAMIN D3) 25 MCG (1000 UNIT) tablet Take 2,000 Units by mouth daily with breakfast.     clopidogrel (PLAVIX) 75 MG tablet Take 1 tablet (75 mg total) by mouth daily. 90 tablet 3   digoxin (LANOXIN) 0.125 MG tablet Take 1/2 (one-half) tablet by mouth once daily 45 tablet 3   eplerenone (INSPRA) 50 MG tablet Take 1 tablet by mouth once daily 30 tablet 11   furosemide (LASIX) 20 MG tablet Take 20 mg by mouth daily as needed for fluid or edema.     levothyroxine (SYNTHROID) 50 MCG tablet TAKE 1 TABLET BY  MOUTH BEFORE BREAKFAST 90 tablet 1   linagliptin (TRADJENTA) 5 MG TABS tablet Take 1 tablet (5 mg total) by mouth daily. 90 tablet 1   Melatonin 1 MG CAPS Take 2 mg by mouth daily as needed (sleep).      neomycin-bacitracin-polymyxin (NEOSPORIN) ointment Apply 1 application topically daily as needed for wound care.     nitroGLYCERIN (NITROSTAT) 0.4 MG SL tablet Place 0.4 mg under the tongue every 5 (five) minutes as needed for chest pain.     polyethylene glycol (MIRALAX / GLYCOLAX) packet Take 17 g by mouth daily as needed for mild constipation or moderate constipation.     rosuvastatin (CRESTOR) 10 MG tablet Take 1 tablet by mouth once daily 90 tablet 0   sacubitril-valsartan (ENTRESTO) 97-103 MG Take 1 tablet by mouth 2 (two) times daily. 180 tablet 3   sodium zirconium cyclosilicate (LOKELMA) 10 g PACK packet Take 10 g by mouth daily. 90 each 3   Tetrahydrozoline HCl (VISINE OP) Place 1 drop into both eyes daily as needed (irritation).     carvedilol (COREG) 12.5 MG tablet Take 1 tablet (12.5 mg total) by mouth in the morning AND 1.5 tablets (18.75 mg total) every evening. 225 tablet 3   No current facility-administered medications for this encounter.    Allergies:    Xanax [alprazolam]   Social History:  The patient  reports that he has never smoked. He has never used smokeless tobacco. He reports that he does not drink alcohol and does not use drugs.   Family History:  The patient's family history includes Heart attack in his father and mother; Heart disease in his father, maternal grandmother, and mother; Hypertension in his father and sister.   ROS:  Please see the history of present illness.   All other systems are personally reviewed and negative.   Exam:   BP (!) 150/88    Pulse 78    Wt 90.4 kg (199 lb 6.4 oz)    SpO2 98%    BMI 27.04 kg/m   General: NAD Neck: No JVD, no thyromegaly or thyroid nodule. Large lipoma right neck.  Lungs: Clear to auscultation bilaterally with normal respiratory effort. CV: Nondisplaced PMI.  Heart regular S1/S2, no S3/S4, no murmur.  No peripheral edema.  No carotid bruit.  Normal pedal pulses.  Abdomen: Soft, nontender, no hepatosplenomegaly, no distention.  Skin: Intact without lesions or rashes.  Neurologic: Alert and oriented x 3.  Psych: Normal affect. Extremities: No clubbing or cyanosis.  HEENT: Normal.   Recent Labs: 10/17/2019: ALT 15; B Natriuretic Peptide 4,254.3 12/25/2019: Platelets 151 01/08/2020: Hemoglobin 13.6; Hemoglobin 13.6 06/16/2020: BUN 29; Creatinine, Ser 1.58; Potassium 4.8; Sodium 137 08/03/2020: TSH 4.14  Personally reviewed   Wt Readings from Last 3 Encounters:  09/17/20 90.4 kg (199 lb 6.4 oz)  08/11/20 90.8 kg (200 lb 3.2 oz)  07/15/20 89.2 kg (196 lb 9.6 oz)    ASSESSMENT AND PLAN:  1. Chronic systolic CHF: Ischemic cardiomyopathy.  TEE 10/17 with EF 20-25%.  Echo in 3/21 shoed EF 20-25%, normal RV function, normal bioprosthetic mitral valve.  CPX in 3/21 showed severe functional impairment due to HF with concern for poor short-term prognosis.  RHC in 4/21 showed normal filling pressures but low cardiac index.  I talked to him about advanced therapies given poor prognosis with  low cardiac output. He is not a transplant candidate.  He has had 2 prior sternotomies, which makes LVAD more complicated.  He talked with Dr  Atkins about LVAD approach from left lateral thoracotomy.  We have discussed LVAD extensively.  He is very clear that he does not want another cardiac surgery. Symptoms somewhat better recently, NYHA class II. He is not volume overloaded by exam or Optivol.  - Continue to use Lasix prn.    - Continue Entresto 97/103 bid. BMET today.  - Gynecomastia with spironolactone.  Continue eplerenone 50 mg daily.     - BP drops after am meds and he gets orthostatic.  I will have him decrease am Coreg to 12.5 mg, continue to take 18.75 mg in the evening.      - He has been on Lokelma chronically to control hyperkalemia and allow use of eplerenone and Entresto.  - His wife is concerned about using [place] and says that his endocrinologist did not want him to start it.  I think it would be helpful with CKD and CHF, but to avoid confusion will not press the point.   - Continue digoxin 0.0625 daily.  Check digoxin level.  - As he has turned down LVAD, we discussed vagal nerve stimulation (ANTHEM trial) to help with his symptoms. His right neck lipoma poses a problem for vagal lead placement, however. He would need the lipoma removed.  As it is pressing on his neck more now with some dysphagia, he is interested in having it taken off.  I would support this if it could be done without general anesthesia.  I have referred him to Dr. Lucia Gaskins with ENT to assess, his wife says that they will make an appt.  2. CAD: s/p redo CABG. Admission in 3/18 with NSTEMI, LHC showed occlusion of SVG-PDA from CABG#1 but patent SVG-PDA from CABG#2, no intervention.  He had a post-op NSTEMI after inguinal hernia repair in 11/18.  LHC showed occlusion of a PLV branch that had been backfilled by SVG-PDA (prior cath had shown severe diffuse disease in the PLV). Minimal recent chest pain.     - Continue  statin => Crestor 10 mg daily. Good lipids in 1/21.    - Continue ASA 81. 3. Bioprosthetic mitral valve: Stable 3/21 echo with no significant regurgitation or stenosis.   4. Atrial flutter: s/p ablation in 8/18.  He is in NSR and off amiodarone.  - Now off warfarin with flutter ablation and no recurrence. If he has recurrence of atrial arrhythmias, based on most recent data DOAC would be a reasonable choice for him even with bioprosthetic MV.  5. CKD: Stage III.  BMET.   6. Type II diabetes: Followed by endocrinology. 7. CVA: In 1/21, got tPA at the ER in [place].  Symptoms resolved.  CTA head/neck showed no carotid stenosis.  Device interrogation has shown no atrial fibrillation. If atrial fibrillation is noted on future monitoring, will need anticoagulation.  - Continue ASA and Plavix 75 daily.  - Continue Crestor.   Followup in 3 months.   Signed, Loralie Champagne, MD  09/19/2020  [facility] [address] [facility] [place] [address] [phone] (office) [phone] (fax)

## 2020-09-22 ENCOUNTER — Other Ambulatory Visit: Payer: Self-pay | Admitting: Cardiology

## 2020-09-22 DIAGNOSIS — I5022 Chronic systolic (congestive) heart failure: Secondary | ICD-10-CM | POA: Diagnosis not present

## 2020-09-23 LAB — BASIC METABOLIC PANEL (7)
BUN/Creatinine Ratio: 21 (ref 10–24)
BUN: 35 mg/dL — ABNORMAL HIGH (ref 8–27)
CO2: 21 mmol/L (ref 20–29)
Chloride: 100 mmol/L (ref 96–106)
Creatinine, Ser: 1.69 mg/dL — ABNORMAL HIGH (ref 0.76–1.27)
GFR calc Af Amer: 46 mL/min/{1.73_m2} — ABNORMAL LOW (ref >59)
GFR calc non Af Amer: 40 mL/min/{1.73_m2} — ABNORMAL LOW (ref >59)
Glucose: 195 mg/dL — ABNORMAL HIGH (ref 65–99)
Potassium: 4.9 mmol/L (ref 3.5–5.2)
Sodium: 138 mmol/L (ref 134–144)

## 2020-10-26 ENCOUNTER — Ambulatory Visit (INDEPENDENT_AMBULATORY_CARE_PROVIDER_SITE_OTHER): Payer: Medicare Other

## 2020-10-26 DIAGNOSIS — I255 Ischemic cardiomyopathy: Secondary | ICD-10-CM

## 2020-10-26 LAB — CUP PACEART REMOTE DEVICE CHECK
Battery Remaining Longevity: 25 mo
Battery Voltage: 2.93 V
Brady Statistic AP VP Percent: 64.98 %
Brady Statistic AP VS Percent: 1.09 %
Brady Statistic AS VP Percent: 33.01 %
Brady Statistic AS VS Percent: 0.93 %
Brady Statistic RA Percent Paced: 64.72 %
Brady Statistic RV Percent Paced: 95.37 %
Date Time Interrogation Session: 20220125044227
HighPow Impedance: 54 Ohm
Implantable Lead Implant Date: 20171024
Implantable Lead Implant Date: 20171024
Implantable Lead Implant Date: 20171024
Implantable Lead Location: 753858
Implantable Lead Location: 753859
Implantable Lead Location: 753860
Implantable Lead Model: 4598
Implantable Lead Model: 5076
Implantable Pulse Generator Implant Date: 20171024
Lead Channel Impedance Value: 241.412
Lead Channel Impedance Value: 245.538
Lead Channel Impedance Value: 249.509
Lead Channel Impedance Value: 265.661
Lead Channel Impedance Value: 270.667
Lead Channel Impedance Value: 304 Ohm
Lead Channel Impedance Value: 399 Ohm
Lead Channel Impedance Value: 418 Ohm
Lead Channel Impedance Value: 456 Ohm
Lead Channel Impedance Value: 513 Ohm
Lead Channel Impedance Value: 532 Ohm
Lead Channel Impedance Value: 551 Ohm
Lead Channel Impedance Value: 608 Ohm
Lead Channel Impedance Value: 836 Ohm
Lead Channel Impedance Value: 874 Ohm
Lead Channel Impedance Value: 931 Ohm
Lead Channel Impedance Value: 950 Ohm
Lead Channel Impedance Value: 988 Ohm
Lead Channel Pacing Threshold Amplitude: 0.5 V
Lead Channel Pacing Threshold Amplitude: 0.75 V
Lead Channel Pacing Threshold Amplitude: 0.875 V
Lead Channel Pacing Threshold Pulse Width: 0.4 ms
Lead Channel Pacing Threshold Pulse Width: 0.4 ms
Lead Channel Pacing Threshold Pulse Width: 1 ms
Lead Channel Sensing Intrinsic Amplitude: 0.625 mV
Lead Channel Sensing Intrinsic Amplitude: 0.625 mV
Lead Channel Sensing Intrinsic Amplitude: 11.5 mV
Lead Channel Sensing Intrinsic Amplitude: 11.5 mV
Lead Channel Setting Pacing Amplitude: 1.5 V
Lead Channel Setting Pacing Amplitude: 2 V
Lead Channel Setting Pacing Amplitude: 2 V
Lead Channel Setting Pacing Pulse Width: 0.4 ms
Lead Channel Setting Pacing Pulse Width: 1 ms
Lead Channel Setting Sensing Sensitivity: 0.3 mV

## 2020-10-29 ENCOUNTER — Other Ambulatory Visit (HOSPITAL_COMMUNITY): Payer: Self-pay | Admitting: Cardiology

## 2020-11-06 NOTE — Progress Notes (Signed)
Remote ICD transmission.   

## 2020-12-04 ENCOUNTER — Other Ambulatory Visit: Payer: Self-pay | Admitting: Cardiology

## 2020-12-08 ENCOUNTER — Encounter (HOSPITAL_COMMUNITY): Payer: Self-pay | Admitting: Cardiology

## 2020-12-08 ENCOUNTER — Other Ambulatory Visit: Payer: Self-pay

## 2020-12-08 ENCOUNTER — Ambulatory Visit (HOSPITAL_COMMUNITY)
Admission: RE | Admit: 2020-12-08 | Discharge: 2020-12-08 | Disposition: A | Discharge status: Home / Self Care | Payer: Medicare Other | Source: Ambulatory Visit | Attending: Cardiology | Admitting: Cardiology

## 2020-12-08 VITALS — BP 124/60 | Wt 201.2 lb

## 2020-12-08 DIAGNOSIS — Z79899 Other long term (current) drug therapy: Secondary | ICD-10-CM | POA: Diagnosis not present

## 2020-12-08 DIAGNOSIS — N183 Chronic kidney disease, stage 3 unspecified: Secondary | ICD-10-CM | POA: Insufficient documentation

## 2020-12-08 DIAGNOSIS — Z79890 Hormone replacement therapy (postmenopausal): Secondary | ICD-10-CM | POA: Insufficient documentation

## 2020-12-08 DIAGNOSIS — I252 Old myocardial infarction: Secondary | ICD-10-CM | POA: Insufficient documentation

## 2020-12-08 DIAGNOSIS — Z950 Presence of cardiac pacemaker: Secondary | ICD-10-CM | POA: Diagnosis not present

## 2020-12-08 DIAGNOSIS — Z7984 Long term (current) use of oral hypoglycemic drugs: Secondary | ICD-10-CM | POA: Insufficient documentation

## 2020-12-08 DIAGNOSIS — I442 Atrioventricular block, complete: Secondary | ICD-10-CM | POA: Insufficient documentation

## 2020-12-08 DIAGNOSIS — Z953 Presence of xenogenic heart valve: Secondary | ICD-10-CM | POA: Diagnosis not present

## 2020-12-08 DIAGNOSIS — I25810 Atherosclerosis of coronary artery bypass graft(s) without angina pectoris: Secondary | ICD-10-CM | POA: Insufficient documentation

## 2020-12-08 DIAGNOSIS — Z8249 Family history of ischemic heart disease and other diseases of the circulatory system: Secondary | ICD-10-CM | POA: Diagnosis not present

## 2020-12-08 DIAGNOSIS — Z8673 Personal history of transient ischemic attack (TIA), and cerebral infarction without residual deficits: Secondary | ICD-10-CM | POA: Insufficient documentation

## 2020-12-08 DIAGNOSIS — Z7902 Long term (current) use of antithrombotics/antiplatelets: Secondary | ICD-10-CM | POA: Insufficient documentation

## 2020-12-08 DIAGNOSIS — E1122 Type 2 diabetes mellitus with diabetic chronic kidney disease: Secondary | ICD-10-CM | POA: Insufficient documentation

## 2020-12-08 DIAGNOSIS — Z7982 Long term (current) use of aspirin: Secondary | ICD-10-CM | POA: Insufficient documentation

## 2020-12-08 DIAGNOSIS — I5022 Chronic systolic (congestive) heart failure: Secondary | ICD-10-CM | POA: Diagnosis present

## 2020-12-08 DIAGNOSIS — Z951 Presence of aortocoronary bypass graft: Secondary | ICD-10-CM | POA: Insufficient documentation

## 2020-12-08 DIAGNOSIS — I4892 Unspecified atrial flutter: Secondary | ICD-10-CM | POA: Diagnosis not present

## 2020-12-08 LAB — BASIC METABOLIC PANEL
Anion gap: 10 (ref 5–15)
BUN: 29 mg/dL — ABNORMAL HIGH (ref 8–23)
CO2: 24 mmol/L (ref 22–32)
Calcium: 9.3 mg/dL (ref 8.9–10.3)
Chloride: 104 mmol/L (ref 98–111)
Creatinine, Ser: 1.63 mg/dL — ABNORMAL HIGH (ref 0.61–1.24)
GFR, Estimated: 44 mL/min — ABNORMAL LOW (ref >60)
Glucose, Bld: 106 mg/dL — ABNORMAL HIGH (ref 70–99)
Potassium: 4.9 mmol/L (ref 3.5–5.1)
Sodium: 138 mmol/L (ref 135–145)

## 2020-12-08 LAB — DIGOXIN LEVEL: Digoxin Level: 0.6 ng/mL — ABNORMAL LOW (ref 0.8–2.0)

## 2020-12-08 MED ORDER — CARVEDILOL 12.5 MG PO TABS: 18.7500 mg | ORAL_TABLET | Freq: Two times a day (BID) | ORAL | 11 refills | Status: DC

## 2020-12-08 NOTE — Progress Notes (Signed)
Date:  12/08/2020   ID:  [name], DOB Feb 22, 1946, MRN [id number]   Provider location: [facility] Type of Visit: Established patient   PCP:  Sharilyn Sites, MD  Cardiologist: Dr. Sallyanne Kuster  HF Cardiologist:  Loralie Champagne, MD   History of Present Illness: Christian Bowman is a 75 y.o. male with history of CAD s/p CABG in 2007 then redo CABG in 10/17 with mitral valve replacement.  He was admitted in 10/17 for redo CABG with SVG-PDA and SVG-ramus.  He also had mitral valve replacement with a bioprosthetic valve because of infarct-related mitral regurgitation.  Post-operative course was complicated by CHF requiring diuresis.  He also had atrial flutter and required DCCV.  Due to complete heart block, he later got a CRT-D system.    At a prior visit, he was in atrial flutter.  He saw Dr. Curt Bears, it was decided to arrange for DCCV with ablation down the road when PPM leads have been in longer.  He had successful DCCV in 12/17 and is in NSR today.   He was admitted in 3/18 with NSTEMI and chest pain. TnI only 0.5.  LHC showed occlusion of SVG-PDA from CABG#1 to be the likely culprit.  However, SVG-PDA from CABG#2 was patent.  No intervention.  Echo in 3/18 from [place] showed EF 30-35%, stable bioprosthetic mitral valve.   He had atrial flutter ablation in 8/18.  He is in NSR today and is off amiodarone.    In 11/18, he had an inguinal hernia repair. Post-op, he had an NSTEMI.  LHC showed occlusion of a PLV branch that had been backfilled by SVG-PDA (prior cath had shown severe diffuse disease in the PLV).  Echo in 11/18 showed EF 20-25% with normal bioprosthetic mitral valve.    With no recurrence of atrial arrhythmias, he is now off anticoagulation.   Echo in 7/20 showed EF 25-30%, bioprosthetic MV with no MR and mean gradient 5 mmHg, normal RV.    In 1/21, he developed right hand weakness and dysarthria concerning for CVA.  He went to the ER in [place] and had  tPA.  Symptoms resolved.  Atrial fibrillation was not noted per his wife's report.  CTA head/neck showed no significant carotid stenosis.  He was started on Plavix 75 mg daily and sent home.    CPX (3/21) showed severe functional impairment due to HF.  Echo showed EF 20-25%, normal RV, normally functioning bioprosthetic mitral valve.  RHC in 4/21 showed low filling pressures and low cardiac index (2.1 Fick, 1.9 thermo).  He saw Dr. Orvan Seen to discuss possible lateral thoracotomy access LVAD. He decided against LVAD placement.   He returns for followup of CHF.  BP has been mildly elevated at times.  He is going to maintenance cardiac rehab in [place] twice a week. No dyspnea walking on flat ground.  He is short of breath walking up a hill.  He has not had chest pain recently.  He has a presumed lipoma on his right neck, rarely has swallowing difficulty, generally only with "dry" foods like toast. He feels like it is not particularly bothersome.  No lightheadedness.   Medtronic device interrogation: Stable thoracic impedance, no VT/AF, >95% BiV pacing.   ECG (personally reviewed): A-BiV paced  Labs (11/17): K 4.8, creatinine 1.89, hgb 12.3, digoxin 0.9, LFTs normal, TSH 8.235 (mild increase), free T3 low, free T4 normal, LFTs normal.  Labs (12/17): K 4.5, creatinine 1.7, BNP  [place]  (3/18): K 3.9, creatinine 1.48, hgb 11.3 Labs (4/18): digoxin 0.5, LFTs normal Labs (7/18): K 5.1, creatinine 1.78, LDL 63, HDL 29, TSH elevated Labs (11/18): K 4.5, creatinine 1.86, hgb 13.6 Labs (12/18): K 5, creatinine 1.88 Labs (1/19): digoxin 0.3, K 4.7, creatinine 1.83 Labs (6/19): LDL 70, HDL 32, K 5.1, creatinine 1.88 Labs (12/19): LDL 60, HDL 31 Labs (1/20): K 5.1, creatinine 1.64 Labs (4/20): K 4.5, creatinine 1.77 Labs (8/20): K 4.6, creatinine 1.5 Labs (1/21): K 4.5, creatinine 1.58, LDL 69 Labs (2/21): K 4.5, creatinine 1.66 Labs (4/21): K 4.1, creatinine 1.63, digoxin 0.4 Labs (5/21): K 4.4,  creatinine 1.51 Labs (6/21): K 4.4, creatinine 1.51, digoxin 0.3 Labs (12/21): K 4.9, creatinine 1.69  PMH: 1. CAD: CABG 2007.  - LHC (9/17) with patent LIMA-LAD, totally occluded SVG-D, severe stenosis in SVG-PDA.  - Redo CABG 10/17 with SVG-PDA, SVG-ramus and mitral valve replacement.  - NSTEMI 3/18.  LHC with culprit lesion likely occlusion of SVG-PDA from CABG#1.  Patent SVG-PDA from CABG#2.  No intervention.  - NSTEMI 11/18 post-op inguinal hernia repair.  LHC with occlusion of a PLV branch that had been backfilled by SVG-PDA (prior cath had shown severe diffuse disease in the PLV).  2. Mitral regurgitation: Ischemic MR, mitral valve replacement was done in 10/17 (bioprosthetic). 3. Complete heart block: Post-op in 10/17.  Medtronic CRT-D placed.  4. Atrial flutter: DCCV 10/17 and again in 12/17.  - Ablation 8/18, now off amiodarone.  5. Type II diabetes 6. Hyperlipidemia 7. Chronic systolic CHF: Ischemic cardiomyopathy.   - TEE (10/17): EF 20-25%, severe LV dilation - Echo (3/18, [place]): EF 30-35%, bioprosthetic mitral valve with mean gradient 5 mmHg.  - Echo (11/18): EF 20-25%, moderate LV dilation, normal bioprosthetic mitral valve.  - Echo (7/20): EF 25-30%, moderate LV dilation, inferior/inferolateral AK, bioprosthetic MV with mean gradient 5 and no MR, normal RV.  - Painful gynecomastia with spironolactone.  - Echo (1/21): EF < 20%, normal RV size and systolic function, bioprosthetic mitral valve with mild MR, mean gradient 2 mmHg.  - CPX (3/21): peak VO2 11.2, VE/VCO2 slope 56, RER 1.21.  - Echo (3/21): EF 20-25%, no LV thrombus, RV normal, bioprosthetic mitral valve appear normal.  - RHC (4/21): mean RA 3, PA 25/7, mean PCWP 11, CI 2.1 (Fick), 1.9 (thermo).  8. Hypothyroidism 9. CVA 1/21: CTA head/neck with no carotid stenosis.   Current Outpatient Medications  Medication Sig Dispense Refill  . amoxicillin (AMOXIL) 500 MG tablet Take 4 tablets (2,000 mg total) by mouth  See admin instructions. Take 2000 mg 1 hour prior to dental work 4 tablet 0  . aspirin EC 81 MG tablet Take 81 mg by mouth daily.     . cholecalciferol (VITAMIN D3) 25 MCG (1000 UNIT) tablet Take 2,000 Units by mouth daily with breakfast.    . clopidogrel (PLAVIX) 75 MG tablet Take 1 tablet (75 mg total) by mouth daily. 90 tablet 3  . digoxin (LANOXIN) 0.125 MG tablet Take 1/2 (one-half) tablet by mouth once daily 45 tablet 3  . eplerenone (INSPRA) 50 MG tablet Take 1 tablet by mouth once daily 30 tablet 11  . furosemide (LASIX) 20 MG tablet Take 20 mg by mouth daily as needed for fluid or edema.    [name] levothyroxine (SYNTHROID) 50 MCG tablet TAKE 1 TABLET BY MOUTH BEFORE BREAKFAST 90 tablet 1  . linagliptin (TRADJENTA) 5 MG TABS tablet Take 1 tablet (5 mg total) by mouth daily. [place]  tablet 1  . Melatonin 1 MG CAPS Take 2 mg by mouth daily as needed (sleep).     . neomycin-bacitracin-polymyxin (NEOSPORIN) ointment Apply 1 application topically daily as needed for wound care.    . nitroGLYCERIN (NITROSTAT) 0.4 MG SL tablet DISSOLVE ONE TABLET UNDER THE TONGUE EVERY 5 MINUTES AS NEEDED FOR CHEST PAIN.  DO NOT EXCEED A TOTAL OF 3 DOSES IN 15 MINUTES 25 tablet 0  . polyethylene glycol (MIRALAX / GLYCOLAX) packet Take 17 g by mouth daily as needed for mild constipation or moderate constipation.    . rosuvastatin (CRESTOR) 10 MG tablet Take 1 tablet by mouth once daily 90 tablet 3  . sacubitril-valsartan (ENTRESTO) 97-103 MG Take 1 tablet by mouth 2 (two) times daily. 180 tablet 3  . sodium zirconium cyclosilicate (LOKELMA) 10 g PACK packet Take 10 g by mouth daily. 90 each 3  . Tetrahydrozoline HCl (VISINE OP) Place 1 drop into both eyes daily as needed (irritation).    . carvedilol (COREG) 12.5 MG tablet Take 1.5 tablets (18.75 mg total) by mouth 2 (two) times daily with a meal. 90 tablet 11   No current facility-administered medications for this encounter.    Allergies:   Xanax [alprazolam]   Social  History:  The patient  reports that he has never smoked. He has never used smokeless tobacco. He reports that he does not drink alcohol and does not use drugs.   Family History:  The patient's family history includes Heart attack in his father and mother; Heart disease in his father, maternal grandmother, and mother; Hypertension in his father and sister.   ROS:  Please see the history of present illness.   All other systems are personally reviewed and negative.   Exam:   BP 124/60   Wt 91.3 kg (201 lb 3.2 oz)   BMI 27.29 kg/m   General: NAD Neck: No JVD, no thyromegaly or thyroid nodule.  Lungs: Clear to auscultation bilaterally with normal respiratory effort. CV: Nondisplaced PMI.  Heart regular S1/S2, no S3/S4, no murmur.  No peripheral edema.  No carotid bruit.  Normal pedal pulses.  Abdomen: Soft, nontender, no hepatosplenomegaly, no distention.  Skin: Intact without lesions or rashes.  Neurologic: Alert and oriented x 3.  Psych: Normal affect. Extremities: No clubbing or cyanosis.  HEENT: Large presumed lipoma on right neck.   Recent Labs: 12/25/2019: Platelets 151 01/08/2020: Hemoglobin 13.6; Hemoglobin 13.6 08/03/2020: TSH 4.14 12/08/2020: BUN 29; Creatinine, Ser 1.63; Potassium 4.9; Sodium 138  Personally reviewed   Wt Readings from Last 3 Encounters:  12/08/20 91.3 kg (201 lb 3.2 oz)  09/17/20 90.4 kg (199 lb 6.4 oz)  08/11/20 90.8 kg (200 lb 3.2 oz)    ASSESSMENT AND PLAN:  1. Chronic systolic CHF: Ischemic cardiomyopathy.  TEE 10/17 with EF 20-25%.  Echo in 3/21 shoed EF 20-25%, normal RV function, normal bioprosthetic mitral valve.  CPX in 3/21 showed severe functional impairment due to HF with concern for poor short-term prognosis.  RHC in 4/21 showed normal filling pressures but low cardiac index.  I talked to him about advanced therapies given poor prognosis with low cardiac output. He is not a transplant candidate.  He has had 2 prior sternotomies, which makes LVAD more  complicated.  He talked with Dr Orvan Seen about LVAD approach from left lateral thoracotomy.  We have discussed LVAD extensively.  He is very clear that he does not want another cardiac surgery. Symptoms somewhat better recently, NYHA class II. He  is not volume overloaded by exam or Optivol.  - Continue to use Lasix prn.    - Continue Entresto 97/103 bid. BMET today.  - Gynecomastia with spironolactone.  Continue eplerenone 50 mg daily.     - BP has been elevated recently, increase Coreg back to 18.75 mg bid.      - He has been on Lokelma chronically to control hyperkalemia and allow use of eplerenone and Entresto, continue  - His wife is concerned about using [place] and says that his endocrinologist did not want him to start it.  I think it would be helpful with CKD and CHF, but to avoid confusion will not press the point.   - Continue digoxin 0.0625 daily.  Check digoxin level.  - As he has turned down LVAD, we discussed vagal nerve stimulation (ANTHEM trial) to help with his symptoms. His right neck lipoma poses a problem for vagal lead placement, however. He would need the lipoma removed.   2. CAD: s/p redo CABG. Admission in 3/18 with NSTEMI, LHC showed occlusion of SVG-PDA from CABG#1 but patent SVG-PDA from CABG#2, no intervention.  He had a post-op NSTEMI after inguinal hernia repair in 11/18.  LHC showed occlusion of a PLV branch that had been backfilled by SVG-PDA (prior cath had shown severe diffuse disease in the PLV). Minimal recent chest pain.     - Continue statin => Crestor 10 mg daily. Check lipids today.     - Continue ASA 81. 3. Bioprosthetic mitral valve: Stable 3/21 echo with no significant regurgitation or stenosis.   4. Atrial flutter: s/p ablation in 8/18.  He is in NSR and off amiodarone. He is in NSR today.  - Now off warfarin with flutter ablation and no recurrence. If he has recurrence of atrial arrhythmias, based on most recent data DOAC would be a reasonable choice for him  even with bioprosthetic MV.  5. CKD: Stage III.  BMET.   6. Type II diabetes: Followed by endocrinology. 7. CVA: In 1/21, got tPA at the ER in [place].  Symptoms resolved.  CTA head/neck showed no carotid stenosis.  Device interrogation has shown no atrial fibrillation. If atrial fibrillation is noted on future monitoring, will need anticoagulation.  - Continue ASA and Plavix 75 daily.  - Continue Crestor.   Followup with Dr. Sallyanne Kuster in 3 months and me in 6 months.   Signed, Loralie Champagne, MD  12/08/2020  [place] [address] [facility] [place] [address] [phone] (office) [phone] (fax)

## 2020-12-08 NOTE — Patient Instructions (Signed)
Labs done today. We will contact you only if your labs are abnormal.  INCREASE Carvedilol to 18.75(1 & 1/2 tablets) by mouth 2 times daily.  No other medication changes were made. Please continue all current medications as prescribed.  Your physician recommends that you schedule a follow-up appointment in: 6 months. Please contact our office in August to schedule a September appointment.   If you have any questions or concerns before your next appointment please send [place] a message through [place] or call our office at [phone].    TO LEAVE A MESSAGE FOR THE NURSE SELECT OPTION 2, PLEASE LEAVE A MESSAGE INCLUDING: . YOUR NAME . DATE OF BIRTH . CALL BACK NUMBER . REASON FOR CALL**this is important as we prioritize the call backs  YOU WILL RECEIVE A CALL BACK THE SAME DAY AS LONG AS YOU CALL BEFORE 4:00 PM   Do the following things EVERYDAY: 1) Weigh yourself in the morning before breakfast. Write it down and keep it in a log. 2) Take your medicines as prescribed 3) Eat low salt foods--Limit salt (sodium) to 2000 mg per day.  4) Stay as active as you can everyday 5) Limit all fluids for the day to less than 2 liters   At the [facility], you and your health needs are our priority. As part of our continuing mission to provide you with exceptional heart care, we have created designated Provider Care Teams. These Care Teams include your primary Cardiologist (physician) and Advanced Practice Providers (APPs- Physician Assistants and Nurse Practitioners) who all work together to provide you with the care you need, when you need it.   You may see any of the following providers on your designated Care Team at your next follow up: [name] Dr Glori Bickers . Dr Loralie Champagne . Darrick Grinder, NP . Lyda Jester, PA . Audry Riles, PharmD   Please be sure to bring in all your medications bottles to every appointment.

## 2021-01-15 ENCOUNTER — Other Ambulatory Visit (HOSPITAL_COMMUNITY): Payer: Self-pay | Admitting: Cardiology

## 2021-01-18 ENCOUNTER — Other Ambulatory Visit (HOSPITAL_COMMUNITY): Payer: Self-pay | Admitting: Cardiology

## 2021-01-25 ENCOUNTER — Ambulatory Visit (INDEPENDENT_AMBULATORY_CARE_PROVIDER_SITE_OTHER): Payer: Medicare Other

## 2021-01-25 DIAGNOSIS — I255 Ischemic cardiomyopathy: Secondary | ICD-10-CM | POA: Diagnosis not present

## 2021-01-25 LAB — CUP PACEART REMOTE DEVICE CHECK
Battery Remaining Longevity: 24 mo
Battery Voltage: 2.94 V
Brady Statistic AP VP Percent: 67.08 %
Brady Statistic AP VS Percent: 1.98 %
Brady Statistic AS VP Percent: 30.15 %
Brady Statistic AS VS Percent: 0.8 %
Brady Statistic RA Percent Paced: 67.61 %
Brady Statistic RV Percent Paced: 94.18 %
Date Time Interrogation Session: 20220426033423
HighPow Impedance: 61 Ohm
Implantable Lead Implant Date: 20171024
Implantable Lead Implant Date: 20171024
Implantable Lead Implant Date: 20171024
Implantable Lead Location: 753858
Implantable Lead Location: 753859
Implantable Lead Location: 753860
Implantable Lead Model: 4598
Implantable Lead Model: 5076
Implantable Pulse Generator Implant Date: 20171024
Lead Channel Impedance Value: 1007 Ohm
Lead Channel Impedance Value: 250.943
Lead Channel Impedance Value: 250.943
Lead Channel Impedance Value: 262.946
Lead Channel Impedance Value: 279.525
Lead Channel Impedance Value: 279.525
Lead Channel Impedance Value: 342 Ohm
Lead Channel Impedance Value: 399 Ohm
Lead Channel Impedance Value: 418 Ohm
Lead Channel Impedance Value: 475 Ohm
Lead Channel Impedance Value: 532 Ohm
Lead Channel Impedance Value: 532 Ohm
Lead Channel Impedance Value: 589 Ohm
Lead Channel Impedance Value: 608 Ohm
Lead Channel Impedance Value: 893 Ohm
Lead Channel Impedance Value: 893 Ohm
Lead Channel Impedance Value: 950 Ohm
Lead Channel Impedance Value: 988 Ohm
Lead Channel Pacing Threshold Amplitude: 0.5 V
Lead Channel Pacing Threshold Amplitude: 0.875 V
Lead Channel Pacing Threshold Amplitude: 0.875 V
Lead Channel Pacing Threshold Pulse Width: 0.4 ms
Lead Channel Pacing Threshold Pulse Width: 0.4 ms
Lead Channel Pacing Threshold Pulse Width: 1 ms
Lead Channel Sensing Intrinsic Amplitude: 0.625 mV
Lead Channel Sensing Intrinsic Amplitude: 0.625 mV
Lead Channel Sensing Intrinsic Amplitude: 13.25 mV
Lead Channel Sensing Intrinsic Amplitude: 13.25 mV
Lead Channel Setting Pacing Amplitude: 1.5 V
Lead Channel Setting Pacing Amplitude: 2 V
Lead Channel Setting Pacing Amplitude: 2 V
Lead Channel Setting Pacing Pulse Width: 0.4 ms
Lead Channel Setting Pacing Pulse Width: 1 ms
Lead Channel Setting Sensing Sensitivity: 0.3 mV

## 2021-01-29 ENCOUNTER — Other Ambulatory Visit: Payer: Self-pay | Admitting: "Endocrinology

## 2021-02-02 LAB — COMPREHENSIVE METABOLIC PANEL
ALT: 17 IU/L (ref 0–44)
AST: 14 IU/L (ref 0–40)
Albumin/Globulin Ratio: 1.7 (ref 1.2–2.2)
Albumin: 4.2 g/dL (ref 3.7–4.7)
Alkaline Phosphatase: 65 IU/L (ref 44–121)
BUN/Creatinine Ratio: 20 (ref 10–24)
BUN: 29 mg/dL — ABNORMAL HIGH (ref 8–27)
Bilirubin Total: 0.4 mg/dL (ref 0.0–1.2)
CO2: 22 mmol/L (ref 20–29)
Calcium: 9.2 mg/dL (ref 8.6–10.2)
Chloride: 100 mmol/L (ref 96–106)
Creatinine, Ser: 1.48 mg/dL — ABNORMAL HIGH (ref 0.76–1.27)
Globulin, Total: 2.5 g/dL (ref 1.5–4.5)
Glucose: 220 mg/dL — ABNORMAL HIGH (ref 65–99)
Potassium: 4.5 mmol/L (ref 3.5–5.2)
Sodium: 137 mmol/L (ref 134–144)
Total Protein: 6.7 g/dL (ref 6.0–8.5)
eGFR: 50 mL/min/{1.73_m2} — ABNORMAL LOW (ref >59)

## 2021-02-02 LAB — T4, FREE: Free T4: 1.56 ng/dL (ref 0.82–1.77)

## 2021-02-02 LAB — TSH: TSH: 3.73 u[IU]/mL (ref 0.450–4.500)

## 2021-02-02 LAB — VITAMIN D 25 HYDROXY (VIT D DEFICIENCY, FRACTURES): Vit D, 25-Hydroxy: 40.3 ng/mL (ref 30.0–100.0)

## 2021-02-05 ENCOUNTER — Other Ambulatory Visit (HOSPITAL_COMMUNITY): Payer: Self-pay | Admitting: Cardiology

## 2021-02-09 ENCOUNTER — Other Ambulatory Visit: Payer: Self-pay

## 2021-02-09 ENCOUNTER — Ambulatory Visit (INDEPENDENT_AMBULATORY_CARE_PROVIDER_SITE_OTHER): Payer: Medicare Other | Admitting: "Endocrinology

## 2021-02-09 ENCOUNTER — Encounter: Payer: Self-pay | Admitting: "Endocrinology

## 2021-02-09 VITALS — BP 136/58 | HR 68 | Ht 72.0 in | Wt 203.4 lb

## 2021-02-09 DIAGNOSIS — E039 Hypothyroidism, unspecified: Secondary | ICD-10-CM

## 2021-02-09 DIAGNOSIS — I255 Ischemic cardiomyopathy: Secondary | ICD-10-CM

## 2021-02-09 DIAGNOSIS — E782 Mixed hyperlipidemia: Secondary | ICD-10-CM

## 2021-02-09 DIAGNOSIS — I10 Essential (primary) hypertension: Secondary | ICD-10-CM | POA: Diagnosis not present

## 2021-02-09 DIAGNOSIS — E1122 Type 2 diabetes mellitus with diabetic chronic kidney disease: Secondary | ICD-10-CM

## 2021-02-09 DIAGNOSIS — N1831 Chronic kidney disease, stage 3a: Secondary | ICD-10-CM | POA: Diagnosis not present

## 2021-02-09 DIAGNOSIS — E559 Vitamin D deficiency, unspecified: Secondary | ICD-10-CM

## 2021-02-09 LAB — POCT GLYCOSYLATED HEMOGLOBIN (HGB A1C): HbA1c, POC (controlled diabetic range): 6.5 % (ref 0.0–7.0)

## 2021-02-09 MED ORDER — LEVOTHYROXINE SODIUM 50 MCG PO TABS: ORAL_TABLET | ORAL | 1 refills | Status: DC

## 2021-02-09 NOTE — Patient Instructions (Signed)

## 2021-02-09 NOTE — Progress Notes (Signed)
02/09/2021           Endocrinology follow-up note   Subjective:    Patient ID: Christian Bowman, male    DOB: 09-05-1946,    Past Medical History:  Diagnosis Date  . AICD (automatic cardioverter/defibrillator) present    medtronic-   DR. CROITORU , DR. Aundra Dubin   . Anginal pain ([place])    cp sat 08/11/17  . Anxiety   . CAD (coronary artery disease)   . CHF (congestive heart failure) ([place])   . Complication of anesthesia    took awhile to wake up   . Coronary artery disease involving coronary bypass graft   . Cyst of neck    right side  . DM2 (diabetes mellitus, type 2) ([place]) 08/26/2013  . Dyspnea   . Heart attack ([place])    "not sure when" (08/20/2017)  . HTN (hypertension) 08/26/2013  . Hyperlipidemia 08/26/2013  . Hypothyroidism   . Left main coronary artery disease   . Left renal artery stenosis ([place])    Genesis 6x12 stent 2007  . Obesity (BMI 30.0-34.9) 08/26/2013  . Postoperative atrial fibrillation ([place]) 10/15/2005  . Presence of permanent cardiac pacemaker   . S/P CABG x 4 10/13/2005   LIMA to LAD, SVG to intermediate branch, sequential SVG to PDA and RPL branch, EVH via right thigh  . S/P mitral valve replacement with bioprosthetic valve 07/11/2016   31 mm [facility] Mitral bovine bioprosthetic tissue valve  . S/P redo CABG x 2 07/11/2016   SVG to PDA and SVG to Intermediate Branch, EVH via left thigh   Past Surgical History:  Procedure Laterality Date  . A-FLUTTER ABLATION N/A 05/11/2017   Procedure: A-Flutter Ablation;  Surgeon: Constance Haw, MD;  Location: [place] [facility];  Service: Cardiovascular;  Laterality: N/A;  . CARDIAC CATHETERIZATION N/A 06/21/2016   Procedure: Right/Left Heart Cath and Coronary/Graft Angiography;  Surgeon: Sherren Mocha, MD;  Location: [place] [facility];  Service: Cardiovascular;  Laterality: N/A;  . CARDIAC VALVE REPLACEMENT    . CARDIOVERSION N/A 07/19/2016   Procedure: CARDIOVERSION;  Surgeon: Lelon Perla, MD;   Location: [facility] ENDOSCOPY;  Service: Cardiovascular;  Laterality: N/A;  . CARDIOVERSION N/A 09/08/2016   Procedure: CARDIOVERSION;  Surgeon: Fay Records, MD;  Location: [place];  Service: Cardiovascular;  Laterality: N/A;  . CORONARY ANGIOPLASTY     STENT 2016  [place]     DES in SVG to right coronary artery system  . CORONARY ARTERY BYPASS GRAFT  10/13/2005   LIMA to LAD, SVG to intermediate branch, sequential SVG to PDA and RPL  . CORONARY ARTERY BYPASS GRAFT N/A 07/11/2016   Procedure: REDO CORONARY ARTERY BYPASS GRAFTING (CABG) x two using left leg greater saphenous vein harvested endoscopically-SVG to PDA -SVG to RAMUS INTERMEDIATE;  Surgeon: Rexene Alberts, MD;  Location: [place];  Service: Open Heart Surgery;  Laterality: N/A;  . CORONARY ARTERY BYPASS GRAFT N/A 07/11/2016   Procedure: Re-exploration (CABG) for post op bleeding,;  Surgeon: Rexene Alberts, MD;  Location: [place];  Service: Open Heart Surgery;  Laterality: N/A;  . EP IMPLANTABLE DEVICE N/A 07/25/2016   Procedure: BiV ICD Insertion CRT-D;  Surgeon: Will Meredith Leeds, MD;  Location: [place] [facility];  Service: Cardiovascular;  Laterality: N/A;  . INGUINAL HERNIA REPAIR Left 08/20/2017  . INGUINAL HERNIA REPAIR Left 08/20/2017   Procedure: OPEN LEFT INGUINAL HERNIA REPAIR;  Surgeon: Alphonsa Overall,  MD;  Location: [place];  Service: General;  Laterality: Left;  . LEFT HEART CATH AND CORS/GRAFTS ANGIOGRAPHY N/A 12/18/2016   Procedure: Left Heart Cath and Cors/Grafts Angiography;  Surgeon: Sherren Mocha, MD;  Location: [place] [facility];  Service: Cardiovascular;  Laterality: N/A;  . LEFT HEART CATH AND CORS/GRAFTS ANGIOGRAPHY N/A 08/22/2017   Procedure: LEFT HEART CATH AND CORS/GRAFTS ANGIOGRAPHY;  Surgeon: Larey Dresser, MD;  Location: [place] [facility];  Service: Cardiovascular;  Laterality: N/A;  . MITRAL VALVE REPLACEMENT N/A 07/11/2016   Procedure: MITRAL VALVE  (MV) REPLACEMENT;  Surgeon: Rexene Alberts, MD;  Location: [place];  Service: Open Heart Surgery;  Laterality: N/A;  . MYOCARDICAL PERFUSION  10/09/2007   NORMAL PERFUSION IN ALL REGIONS;NO EVIDENCE OF INDUCIBLE ISCHEMIA;POST STRESS EF% 66  . RENAL ARTERY STENT Right 2007  . RENAL DOPPLER  03/28/2010   RIGHT RA-NORMAL;LEFT PROXIMAL RA AT STENT-PATENT WITH NO EVIDENCE OF SIGN DIAMETER REDUCTION. R & L KIDNEYS: EQUAL IN SIZE,SYMMETRICAL IN SHAPE.  [name] RIGHT HEART CATH N/A 01/08/2020   Procedure: RIGHT HEART CATH;  Surgeon: Larey Dresser, MD;  Location: [place] [facility];  Service: Cardiovascular;  Laterality: N/A;  . TEE WITHOUT CARDIOVERSION N/A 06/15/2016   Procedure: TRANSESOPHAGEAL ECHOCARDIOGRAM (TEE);  Surgeon: Sanda Klein, MD;  Location: [facility] ENDOSCOPY;  Service: Cardiovascular;  Laterality: N/A;  . TEE WITHOUT CARDIOVERSION N/A 07/11/2016   Procedure: TRANSESOPHAGEAL ECHOCARDIOGRAM (TEE);  Surgeon: Rexene Alberts, MD;  Location: [place];  Service: Open Heart Surgery;  Laterality: N/A;  . TEE WITHOUT CARDIOVERSION N/A 07/19/2016   Procedure: TRANSESOPHAGEAL ECHOCARDIOGRAM (TEE);  Surgeon: Lelon Perla, MD;  Location: [facility] ENDOSCOPY;  Service: Cardiovascular;  Laterality: N/A;  . TRANSESOPHAGEAL ECHOCARDIOGRAM  10/19/2005   NORMAL LV; MILD TO MODERATE AMOUNT OF SOFT ATHEROMATOUS PLAQUE OF THE THORACIC AORTA; THE LEFT ATRIUM IS MILDLY DILATED;LEFT ATRIAL APPENDAGE FUNCTION IS NORMAL;NO THROMBUS IDENTIFIED. SMALL PFO WITH RIGHT TO LEFT SHUNT   Social History   Socioeconomic History  . Marital status: Married    Spouse name: Not on file  . Number of children: 0  . Years of education: 64  . Highest education level: Not on file  Occupational History  . Occupation: retired  Tobacco Use  . Smoking status: Never Smoker  . Smokeless tobacco: Never Used  Vaping Use  . Vaping Use: Never used  Substance and Sexual Activity  . Alcohol use: No  . Drug use: No  . Sexual activity: Never  Other  Topics Concern  . Not on file  Social History Narrative   Right handed   Two story home   Drinks half and half coffee   Social Determinants of Health   Financial Resource Strain: Not on file  Food Insecurity: Not on file  Transportation Needs: Not on file  Physical Activity: Not on file  Stress: Not on file  Social Connections: Not on file   Outpatient Encounter Medications as of 02/09/2021  Medication Sig  . amoxicillin (AMOXIL) 500 MG tablet Take 4 tablets (2,000 mg total) by mouth See admin instructions. Take 2000 mg 1 hour prior to dental work  . aspirin EC 81 MG tablet Take 81 mg by mouth daily.   . carvedilol (COREG) 12.5 MG tablet Take 1.5 tablets (18.75 mg total) by mouth 2 (two) times daily with a meal.  . cholecalciferol (VITAMIN D3) 25 MCG (1000 UNIT) tablet Take 2,000 Units by mouth daily with breakfast.  . clopidogrel (PLAVIX) 75 MG tablet Take  1 tablet by mouth once daily  . digoxin (LANOXIN) 0.125 MG tablet Take 1/2 (one-half) tablet by mouth once daily  . ENTRESTO 97-103 MG Take 1 tablet by mouth twice daily  . eplerenone (INSPRA) 50 MG tablet Take 1 tablet by mouth once daily  . furosemide (LASIX) 20 MG tablet Take 20 mg by mouth daily as needed for fluid or edema.  [name] levothyroxine (SYNTHROID) 50 MCG tablet Take 1 pill daily before breakfast  . linagliptin (TRADJENTA) 5 MG TABS tablet Take 1 tablet (5 mg total) by mouth daily.  . Melatonin 1 MG CAPS Take 2 mg by mouth daily as needed (sleep).   . neomycin-bacitracin-polymyxin (NEOSPORIN) ointment Apply 1 application topically daily as needed for wound care.  . nitroGLYCERIN (NITROSTAT) 0.4 MG SL tablet DISSOLVE ONE TABLET UNDER THE TONGUE EVERY 5 MINUTES AS NEEDED FOR CHEST PAIN.  DO NOT EXCEED A TOTAL OF 3 DOSES IN 15 MINUTES  . polyethylene glycol (MIRALAX / GLYCOLAX) packet Take 17 g by mouth daily as needed for mild constipation or moderate constipation.  . rosuvastatin (CRESTOR) 10 MG tablet Take 1 tablet by  mouth once daily  . sodium zirconium cyclosilicate (LOKELMA) 10 g PACK packet Take 10 g by mouth daily.  . Tetrahydrozoline HCl (VISINE OP) Place 1 drop into both eyes daily as needed (irritation).  . [DISCONTINUED] levothyroxine (SYNTHROID) 50 MCG tablet TAKE 1 TABLET BY MOUTH BEFORE BREAKFAST   No facility-administered encounter medications on file as of 02/09/2021.   ALLERGIES: Allergies  Allergen Reactions  . Xanax [Alprazolam] Other (See Comments)    Pt feels very weak, tired and feels paralyzed     VACCINATION STATUS: Immunization History  Administered Date(s) Administered  . Fluad Quad(high Dose 65+) 07/28/2019  . Influenza,inj,Quad PF,6+ Mos 08/01/2016, 07/11/2017  . Pneumococcal Polysaccharide-23 07/02/2015    Diabetes He presents for his follow-up diabetic visit. He has type 2 diabetes mellitus. Onset time: He was diagnosed at approximate age of 78 years. His disease course has been stable. There are no hypoglycemic associated symptoms. Pertinent negatives for hypoglycemia include no confusion, headaches, pallor or seizures. There are no diabetic associated symptoms. Pertinent negatives for diabetes include no chest pain, no fatigue, no polydipsia, no polyphagia, no polyuria and no weakness. There are no hypoglycemic complications. Symptoms are stable. Diabetic complications include heart disease. Risk factors for coronary artery disease include diabetes mellitus, dyslipidemia, hypertension, male sex and sedentary lifestyle. He is compliant with treatment most of the time. His weight is fluctuating minimally. He participates in exercise intermittently. There is no change in his home blood glucose trend. (His point-of-care A1c is 6.5%, generally improving from 7.2%.  He has no hypoglycemia.  ) An ACE inhibitor/angiotensin II receptor blocker is being taken.  Hyperlipidemia This is a chronic problem. The current episode started more than 1 year ago. Exacerbating diseases include  diabetes. Pertinent negatives include no chest pain, myalgias or shortness of breath. Current antihyperlipidemic treatment includes statins. Risk factors for coronary artery disease include diabetes mellitus, dyslipidemia, hypertension and male sex.  Hypertension This is a chronic problem. The current episode started more than 1 year ago. Pertinent negatives include no chest pain, headaches, neck pain, palpitations or shortness of breath. Risk factors for coronary artery disease include diabetes mellitus and dyslipidemia. Hypertensive end-organ damage includes CAD/MI. Identifiable causes of hypertension include a thyroid problem.  Thyroid Problem Presents for follow-up visit. Patient reports no constipation, diarrhea, fatigue or palpitations. (Remains on levothyroxine 75 mcg p.o. nightly.  He  is compliant.  He has no new complaints today.) The symptoms have been stable. His past medical history is significant for diabetes and hyperlipidemia.    Review of systems  Constitutional: + Steady body weight 200 pounds,   Body mass index is 27.59 kg/m. , no fatigue, no subjective hyperthermia, no subjective hypothermia Eyes: no blurry vision, no xerophthalmia ENT: no sore throat, no nodules palpated in throat, no dysphagia/odynophagia, no hoarseness Cardiovascular: no Chest Pain, no Shortness of Breath, no palpitations, no leg swelling Respiratory: no cough, no shortness of breath Gastrointestinal: no Nausea/Vomiting/Diarhhea Musculoskeletal: no muscle/joint aches Skin: no rashes, no hyperemia Neurological: no tremors, no numbness, no tingling, no dizziness Psychiatric: no depression, no anxiety   Objective:    BP (!) 136/58   Pulse 68   Ht 6' (1.829 m)   Wt 203 lb 6.4 oz (92.3 kg)   BMI 27.59 kg/m   Wt Readings from Last 3 Encounters:  02/09/21 203 lb 6.4 oz (92.3 kg)  12/08/20 201 lb 3.2 oz (91.3 kg)  09/17/20 199 lb 6.4 oz (90.4 kg)     Physical Exam- Limited  Constitutional:  Body  mass index is 27.59 kg/m. , not in acute distress, normal state of mind Eyes:  EOMI, no exophthalmos Neck: Supple Thyroid: No gross goiter Respiratory: Adequate breathing efforts Musculoskeletal: no gross deformities, strength intact in all four extremities, no gross restriction of joint movements Skin:  no rashes, no hyperemia Neurological: no tremor with outstretched hands,   Chemistry (most recent): Lab Results  Component Value Date   NA 137 02/01/2021   K 4.5 02/01/2021   CL 100 02/01/2021   CO2 22 02/01/2021   BUN 29 (H) 02/01/2021   CREATININE 1.48 (H) 02/01/2021     Lipid Panel     Component Value Date/Time   CHOL 122 10/17/2019 1225   CHOL 120 10/01/2018 0925   TRIG 78 10/17/2019 1225   HDL 37 (L) 10/17/2019 1225   HDL 31 (L) 10/01/2018 0925   CHOLHDL 3.3 10/17/2019 1225   VLDL 16 10/17/2019 1225   LDLCALC 69 10/17/2019 1225   LDLCALC 60 10/01/2018 0925   LABVLDL 29 10/01/2018 0925    A1c today August 11, 2020 was 6.3%.  Assessment & Plan:   1. Type 2 diabetes mellitus with other circulatory complication ([place]), stage 3 CKD   His diabetes is  complicated by coronary artery disease status post coronary artery bypass graft and recent ACS and patient remains at a high risk for more acute and chronic complications of diabetes which include CAD, CVA, CKD, retinopathy, and neuropathy. These are all discussed in detail with the patient.  His point-of-care A1c is 6.5%, remaining stable.  He is on Tradjenta, no hypoglycemia.     His most recent CMP still consistent with renal insufficiency- does have history of stage 2-3 renal insufficiency. - I have re-counseled the patient on diet management  by adopting a carbohydrate restricted / protein rich  Diet.   -  Suggestion is made for him to avoid simple carbohydrates  from his diet including Cakes, Sweet Desserts / Pastries, Ice Cream, Soda (diet and regular), Sweet Tea, Candies, Chips, Cookies, Sweet Pastries,  Store  Bought Juices, Alcohol in Excess of  1-2 drinks a day, Artificial Sweeteners, Coffee Creamer, and "Sugar-free" Products. This will help patient to have stable blood glucose profile and potentially avoid unintended weight gain.  - Patient is advised to stick to a routine mealtimes to eat 3 meals  a day  and avoid unnecessary snacks ( to snack only to correct hypoglycemia).   - I have approached patient with the following individualized plan to manage diabetes and patient agrees.  -He has benefited from simplified treatment regimen.  He is advised to continue Tradjenta 5 mg p.o. daily at breakfast for diabetes management.     - Patient specific target  for A1c; LDL, HDL, Triglycerides, were discussed in detail.  2) BP/HTN -His blood pressure is controlled to target.   Reportedly he runs low blood pressure at home and would not tolerate any additional therapy. I advised him to continue his current medications including beta blocker, spironolactone and,  Lasix as needed.   3) Lipids/HPL: His most recent lipid panel showed controlled LDL at 69.  He is advised to continue Crestor 10 mg p.o. nightly.    Side effects and precautions discussed with him.    4)  Weight/Diet: His BMI is 27.59  -a candidate for some weight loss.  CDE consult in progress, exercise, and carbohydrates information provided.  5) hypothyroidism: - This is related to his therapy with amiodarone. His previsit thyroid function tests are consistent with appropriate replacement.  He is advised to continue levothyroxine 50 mcg p.o. daily before breakfast.   - We discussed about the correct intake of his thyroid hormone, on empty stomach at fasting, with water, separated by at least 30 minutes from breakfast and other medications,  and separated by more than 4 hours from calcium, iron, multivitamins, acid reflux medications (PPIs). -Patient is made aware of the fact that thyroid hormone replacement is needed for life, dose to be  adjusted by periodic monitoring of thyroid function tests.  6) vitamin D deficiency: He has responded to vitamin D supplement with correction of his 25-hydroxy vitamin D to 40.  He is advised to continue vitamin D3 2000 units daily.   7) Chronic Care/Health Maintenance:  -Patient is  on ACEI/ARB and Statin medications and encouraged to continue to follow up with Ophthalmology, Podiatrist at least yearly or according to recommendations, and advised to  stay away from smoking. I have recommended yearly flu vaccine and pneumonia vaccination at least every 5 years, and  sleep for at least 7 hours a day.  I advised patient to maintain close follow up with his cardiologist and his  PCP for primary care needs.    I spent 32 minutes in the care of the patient today including review of labs from Thyroid Function, CMP, and other relevant labs ; imaging/biopsy records (current and previous including abstractions from other facilities); face-to-face time discussing  his lab results and symptoms, medications doses, his options of short and long term treatment based on the latest standards of care / guidelines;   and documenting the encounter.  Christian Bowman  participated in the discussions, expressed understanding, and voiced agreement with the above plans.  All questions were answered to his satisfaction. he is encouraged to contact clinic should he have any questions or concerns prior to his return visit.     Follow up plan: Return in about 6 months (around 08/12/2021) for A1c -[place], F/U with Pre-visit Labs.  Glade Lloyd, MD Phone: [phone]  Fax: [phone]   This note was partially dictated with voice recognition software. Similar sounding words can be transcribed inadequately or may not  be corrected upon review.  02/09/2021, 9:25 AM

## 2021-02-16 NOTE — Progress Notes (Signed)
Remote ICD transmission.   

## 2021-02-19 ENCOUNTER — Other Ambulatory Visit (HOSPITAL_COMMUNITY): Payer: Self-pay | Admitting: Cardiology

## 2021-03-07 ENCOUNTER — Other Ambulatory Visit (HOSPITAL_COMMUNITY): Payer: Self-pay

## 2021-03-07 MED ORDER — AMOXICILLIN 500 MG PO TABS: 2000.0000 mg | ORAL_TABLET | ORAL | 0 refills | Status: DC

## 2021-03-19 ENCOUNTER — Other Ambulatory Visit: Payer: Self-pay | Admitting: "Endocrinology

## 2021-03-19 DIAGNOSIS — E1122 Type 2 diabetes mellitus with diabetic chronic kidney disease: Secondary | ICD-10-CM

## 2021-03-19 DIAGNOSIS — N1831 Chronic kidney disease, stage 3a: Secondary | ICD-10-CM

## 2021-03-28 ENCOUNTER — Encounter: Payer: Medicare Other | Admitting: Cardiovascular Disease

## 2021-04-02 ENCOUNTER — Other Ambulatory Visit (HOSPITAL_COMMUNITY): Payer: Self-pay | Admitting: Cardiology

## 2021-04-05 NOTE — Telephone Encounter (Signed)
Manual transmission received.  Normal device function.  No arrythmias logged. Device is programmed with Rate response and Histograms do reflect HR into the 30s.

## 2021-04-11 ENCOUNTER — Ambulatory Visit (INDEPENDENT_AMBULATORY_CARE_PROVIDER_SITE_OTHER): Payer: Medicare Other | Admitting: Cardiovascular Disease

## 2021-04-11 ENCOUNTER — Other Ambulatory Visit: Payer: Self-pay

## 2021-04-11 ENCOUNTER — Encounter: Payer: Self-pay | Admitting: Cardiovascular Disease

## 2021-04-11 VITALS — BP 132/72 | HR 64 | Ht 72.0 in | Wt 204.8 lb

## 2021-04-11 DIAGNOSIS — I493 Ventricular premature depolarization: Secondary | ICD-10-CM

## 2021-04-11 DIAGNOSIS — E1122 Type 2 diabetes mellitus with diabetic chronic kidney disease: Secondary | ICD-10-CM

## 2021-04-11 DIAGNOSIS — I5042 Chronic combined systolic (congestive) and diastolic (congestive) heart failure: Secondary | ICD-10-CM | POA: Diagnosis not present

## 2021-04-11 DIAGNOSIS — I255 Ischemic cardiomyopathy: Secondary | ICD-10-CM | POA: Diagnosis not present

## 2021-04-11 DIAGNOSIS — Z95810 Presence of automatic (implantable) cardiac defibrillator: Secondary | ICD-10-CM

## 2021-04-11 DIAGNOSIS — I25118 Atherosclerotic heart disease of native coronary artery with other forms of angina pectoris: Secondary | ICD-10-CM

## 2021-04-11 DIAGNOSIS — Z8679 Personal history of other diseases of the circulatory system: Secondary | ICD-10-CM | POA: Diagnosis not present

## 2021-04-11 DIAGNOSIS — N1832 Chronic kidney disease, stage 3b: Secondary | ICD-10-CM

## 2021-04-11 DIAGNOSIS — Z9889 Other specified postprocedural states: Secondary | ICD-10-CM

## 2021-04-11 DIAGNOSIS — E782 Mixed hyperlipidemia: Secondary | ICD-10-CM

## 2021-04-11 DIAGNOSIS — Z953 Presence of xenogenic heart valve: Secondary | ICD-10-CM

## 2021-04-11 DIAGNOSIS — I10 Essential (primary) hypertension: Secondary | ICD-10-CM

## 2021-04-11 LAB — PACEMAKER DEVICE OBSERVATION

## 2021-04-11 NOTE — Patient Instructions (Signed)

## 2021-04-12 ENCOUNTER — Encounter: Payer: Self-pay | Admitting: Cardiovascular Disease

## 2021-04-12 NOTE — Progress Notes (Signed)
Cardiology Office Note    Date:  04/12/2021   ID:  Christian Bowman, DOB Jan 03, 1946, MRN [id number]  PCP:  Sharilyn Sites, MD  Cardiologist:  Loralie Champagne, MD; Allegra Lai, MD; Sanda Klein, MD   Chief Complaint  Patient presents with   Congestive Heart Failure   Irregular Heart Beat     History of Present Illness:  Christian Bowman is a 75 y.o. male with extensive and severe cardiac problems.  He has severe cardiomyopathy with LVEF of 20-25% (most recent echo March 2021) due to both ischemic cardiomyopathy and valvular heart disease.  He initially had a CABG in 2007, redo CABG in October 2017 when he also received a mitral valve bioprosthesis (Edwards magna 31 mm, implanted 2017).  He has a dual-chamber biventricular defibrillator (Medtronic) also implanted in 2017.  He has a history of atrial flutter with successful ablation and has been off anticoagulation. In January 2021 he had transient right hand weakness and dysarthria and received thrombolytics at the emergency room in [place].  His device did not show atrial arrhythmia and echocardiogram with contrast did not show LV thrombus.  He was discharged on clopidogrel.  For the most part, Christian Bowman is doing quite well.  A few days ago he had a little bit of dizziness while exercising at the gym and his blood pressure was low around 90/50.  His heart rate was recorded as 35 by checking peripheral pulse.  He did not have an electrocardiogram.  Interrogation of his defibrillator shows normal device function.  His device is programmed to the lower rate limit of 60 bpm.  In the past, artifactual bradycardia has been recorded when he has frequent PVCs, sometimes in a pattern of bigeminy, which I suspect was this case that day as well.  His wife Christian Bowman continues to pay meticulous attention to Kunta's health, including his dietary intake of sodium, exercise, weight and medications.  He is really done quite well and has not required hospitalization or major  diuretic dose changes in a couple of years.  He denies edema, orthopnea, PND, full-blown syncope (occasional orthostatic dizziness), new neurological complaints or intermittent claudication.  He is on maximum tolerated doses of Entresto and carvedilol as well as eplerenone and furosemide.  He is taking an aspirin and clopidogrel and is on a highly effective statin.  Glycemic control is good with a hemoglobin A1c that was recently 6.5%.  His most recent LDL was 69.  Although Jahsiah goes regularly to exercise at the gym, with appropriate supervision, he is otherwise very sedentary..  His device only records 0.2 hours of activity per day, although it sounds like he is more active than that.  Defibrillator interrogation shows normal device function.  He has approximately 1.8 years of device longevity remaining.  There have been no episodes of atrial fibrillation or ventricular tachycardia.  His OptiVol has been very stable for the last 12 months.  He has 93% biventricular pacing and 91.6% effective biventricular pacing due to frequent PVCs.  He has not had a meaningful episode of nonsustained VT since December 2021.  Presenting rhythm is atrial paced, biventricular paced, with relatively frequent atrial sensed, biventricular paced beats as well as PVCs. ef episode of nonsustained ventricular tachycardia, asymptomatic.  His right submandibular mass continues to slowly increase in size.  It does not really bother him except for cosmetic reasons, but it did become an issue when a barostim device was considered (that was later abandoned).    He underwent  redo coronary artery bypass 2 (SVG to PDA, SVG to ramus), as well as mitral valve replacement (bioprosthetic Edwards magna 31 mm) on 29/93/7169, complicated by AV block and atrial flutter requiring cardioversion. Estimated EF 20-25 % on his 07/18/16 TEE. A biventricular defibrillator (Medtronic Claria MRI CRT-D) was implanted on July 25, 2016. On December 18, 2016 he was hospitalized for small non-STEMI and was found to have interval occlusion of the old SVG to PDA, while the more recently placed SVG was still patent. LVEDP was quite low at only 3 mmHg during that catheterization. He had atrial flutter ablation in August 2018 and is off anticoagulation. He had a NSTEMI in November 2018 after inguinal hernia repair surgery.  He understands the need for endocarditis prophylaxis with dental procedures or other medical procedures that might cause bacteremia and the indication for anticoagulation chronically.  He is still following up with Dr. Aundra Dubin and Dr. Curt Bears .  Past Medical History:  Diagnosis Date   AICD (automatic cardioverter/defibrillator) present    medtronic-   DR. Stuart Guillen , DR. Aundra Dubin    Anginal pain ([place])    cp sat 08/11/17   Anxiety    CAD (coronary artery disease)    CHF (congestive heart failure) ([place])    Complication of anesthesia    took awhile to wake up    Coronary artery disease involving coronary bypass graft    Cyst of neck    right side   DM2 (diabetes mellitus, type 2) ([place]) 08/26/2013   Dyspnea    Heart attack ([place])    "not sure when" (08/20/2017)   HTN (hypertension) 08/26/2013   Hyperlipidemia 08/26/2013   Hypothyroidism    Left main coronary artery disease    Left renal artery stenosis ([place])    Genesis 6x12 stent 2007   Obesity (BMI 30.0-34.9) 08/26/2013   Postoperative atrial fibrillation ([place]) 10/15/2005   Presence of permanent cardiac pacemaker    S/P CABG x 4 10/13/2005   LIMA to LAD, SVG to intermediate branch, sequential SVG to PDA and RPL branch, EVH via right thigh   S/P mitral valve replacement with bioprosthetic valve 07/11/2016   31 mm [facility] Mitral bovine bioprosthetic tissue valve   S/P redo CABG x 2 07/11/2016   SVG to PDA and SVG to Intermediate Branch, EVH via left thigh    Past Surgical History:  Procedure Laterality Date   A-FLUTTER ABLATION N/A 05/11/2017   Procedure: A-Flutter  Ablation;  Surgeon: Constance Haw, MD;  Location: [place] [facility];  Service: Cardiovascular;  Laterality: N/A;   CARDIAC CATHETERIZATION N/A 06/21/2016   Procedure: Right/Left Heart Cath and Coronary/Graft Angiography;  Surgeon: Sherren Mocha, MD;  Location: [place] [facility];  Service: Cardiovascular;  Laterality: N/A;   CARDIAC VALVE REPLACEMENT     CARDIOVERSION N/A 07/19/2016   Procedure: CARDIOVERSION;  Surgeon: Lelon Perla, MD;  Location: [facility] ENDOSCOPY;  Service: Cardiovascular;  Laterality: N/A;   CARDIOVERSION N/A 09/08/2016   Procedure: CARDIOVERSION;  Surgeon: Fay Records, MD;  Location: [facility] ENDOSCOPY;  Service: Cardiovascular;  Laterality: N/A;   CORONARY ANGIOPLASTY     STENT 2016  [facility]   CORONARY ANGIOPLASTY WITH STENT PLACEMENT     DES in SVG to right coronary artery system   CORONARY ARTERY BYPASS GRAFT  10/13/2005   LIMA to LAD, SVG to intermediate branch, sequential SVG to PDA and RPL   CORONARY ARTERY BYPASS GRAFT N/A 07/11/2016   Procedure: REDO CORONARY ARTERY BYPASS GRAFTING (  CABG) x two using left leg greater saphenous vein harvested endoscopically-SVG to PDA -SVG to RAMUS INTERMEDIATE;  Surgeon: Rexene Alberts, MD;  Location: [place];  Service: Open Heart Surgery;  Laterality: N/A;   CORONARY ARTERY BYPASS GRAFT N/A 07/11/2016   Procedure: Re-exploration (CABG) for post op bleeding,;  Surgeon: Rexene Alberts, MD;  Location: [place];  Service: Open Heart Surgery;  Laterality: N/A;   EP IMPLANTABLE DEVICE N/A 07/25/2016   Procedure: BiV ICD Insertion CRT-D;  Surgeon: Will Meredith Leeds, MD;  Location: [place] [facility];  Service: Cardiovascular;  Laterality: N/A;   INGUINAL HERNIA REPAIR Left 08/20/2017   INGUINAL HERNIA REPAIR Left 08/20/2017   Procedure: OPEN LEFT INGUINAL HERNIA REPAIR;  Surgeon: Alphonsa Overall, MD;  Location: [place];  Service: General;  Laterality: Left;   LEFT HEART CATH AND CORS/GRAFTS ANGIOGRAPHY N/A 12/18/2016   Procedure:  Left Heart Cath and Cors/Grafts Angiography;  Surgeon: Sherren Mocha, MD;  Location: [place] [facility];  Service: Cardiovascular;  Laterality: N/A;   LEFT HEART CATH AND CORS/GRAFTS ANGIOGRAPHY N/A 08/22/2017   Procedure: LEFT HEART CATH AND CORS/GRAFTS ANGIOGRAPHY;  Surgeon: Larey Dresser, MD;  Location: [place] [facility];  Service: Cardiovascular;  Laterality: N/A;   MITRAL VALVE REPLACEMENT N/A 07/11/2016   Procedure: MITRAL VALVE (MV) REPLACEMENT;  Surgeon: Rexene Alberts, MD;  Location: [place];  Service: Open Heart Surgery;  Laterality: N/A;   MYOCARDICAL PERFUSION  10/09/2007   NORMAL PERFUSION IN ALL REGIONS;NO EVIDENCE OF INDUCIBLE ISCHEMIA;POST STRESS EF% 66   RENAL ARTERY STENT Right 2007   RENAL DOPPLER  03/28/2010   RIGHT RA-NORMAL;LEFT PROXIMAL RA AT STENT-PATENT WITH NO EVIDENCE OF SIGN DIAMETER REDUCTION. R & L KIDNEYS: EQUAL IN SIZE,SYMMETRICAL IN SHAPE.   RIGHT HEART CATH N/A 01/08/2020   Procedure: RIGHT HEART CATH;  Surgeon: Larey Dresser, MD;  Location: [place] [facility];  Service: Cardiovascular;  Laterality: N/A;   TEE WITHOUT CARDIOVERSION N/A 06/15/2016   Procedure: TRANSESOPHAGEAL ECHOCARDIOGRAM (TEE);  Surgeon: Sanda Klein, MD;  Location: [facility] ENDOSCOPY;  Service: Cardiovascular;  Laterality: N/A;   TEE WITHOUT CARDIOVERSION N/A 07/11/2016   Procedure: TRANSESOPHAGEAL ECHOCARDIOGRAM (TEE);  Surgeon: Rexene Alberts, MD;  Location: [place];  Service: Open Heart Surgery;  Laterality: N/A;   TEE WITHOUT CARDIOVERSION N/A 07/19/2016   Procedure: TRANSESOPHAGEAL ECHOCARDIOGRAM (TEE);  Surgeon: Lelon Perla, MD;  Location: [facility] ENDOSCOPY;  Service: Cardiovascular;  Laterality: N/A;   TRANSESOPHAGEAL ECHOCARDIOGRAM  10/19/2005   NORMAL LV; MILD TO MODERATE AMOUNT OF SOFT ATHEROMATOUS PLAQUE OF THE THORACIC AORTA; THE LEFT ATRIUM IS MILDLY DILATED;LEFT ATRIAL APPENDAGE FUNCTION IS NORMAL;NO THROMBUS IDENTIFIED. SMALL PFO WITH RIGHT TO LEFT SHUNT    Current  Medications: Outpatient Medications Prior to Visit  Medication Sig Dispense Refill   aspirin EC 81 MG tablet Take 81 mg by mouth daily.      carvedilol (COREG) 12.5 MG tablet Take 1.5 tablets (18.75 mg total) by mouth 2 (two) times daily with a meal. 90 tablet 11   cholecalciferol (VITAMIN D3) 25 MCG (1000 UNIT) tablet Take 2,000 Units by mouth daily with breakfast.     clopidogrel (PLAVIX) 75 MG tablet Take 1 tablet by mouth once daily 90 tablet 3   digoxin (LANOXIN) 0.125 MG tablet Take 1/2 (one-half) tablet by mouth once daily 45 tablet 4   ENTRESTO 97-103 MG Take 1 tablet by mouth twice daily 180 tablet 3   eplerenone (INSPRA) 50 MG tablet Take 1 tablet  by mouth once daily 30 tablet 11   levothyroxine (SYNTHROID) 50 MCG tablet Take 1 pill daily before breakfast 90 tablet 1   LOKELMA 10 g PACK packet TAKE 1  BY MOUTH ONCE DAILY 90 each 0   rosuvastatin (CRESTOR) 10 MG tablet Take 1 tablet by mouth once daily 90 tablet 3   Tetrahydrozoline HCl (VISINE OP) Place 1 drop into both eyes daily as needed (irritation).     TRADJENTA 5 MG TABS tablet Take 1 tablet by mouth once daily 90 tablet 0   amoxicillin (AMOXIL) 500 MG tablet Take 4 tablets (2,000 mg total) by mouth See admin instructions. Take 2000 mg 1 hour prior to dental work (Patient not taking: Reported on 04/11/2021) 4 tablet 0   furosemide (LASIX) 20 MG tablet Take 20 mg by mouth daily as needed for fluid or edema. (Patient not taking: Reported on 04/11/2021)     Melatonin 1 MG CAPS Take 2 mg by mouth daily as needed (sleep).  (Patient not taking: Reported on 04/11/2021)     neomycin-bacitracin-polymyxin (NEOSPORIN) ointment Apply 1 application topically daily as needed for wound care. (Patient not taking: Reported on 04/11/2021)     nitroGLYCERIN (NITROSTAT) 0.4 MG SL tablet DISSOLVE ONE TABLET UNDER THE TONGUE EVERY 5 MINUTES AS NEEDED FOR CHEST PAIN.  DO NOT EXCEED A TOTAL OF 3 DOSES IN 15 MINUTES 25 tablet 2   polyethylene glycol (MIRALAX  / GLYCOLAX) packet Take 17 g by mouth daily as needed for mild constipation or moderate constipation. (Patient not taking: Reported on 04/11/2021)     No facility-administered medications prior to visit.     Allergies:   Xanax [alprazolam]   Social History   Socioeconomic History   Marital status: Married    Spouse name: Not on file   Number of children: 0   Years of education: 18   Highest education level: Not on file  Occupational History   Occupation: retired  Tobacco Use   Smoking status: Never   Smokeless tobacco: Never  Vaping Use   Vaping Use: Never used  Substance and Sexual Activity   Alcohol use: No   Drug use: No   Sexual activity: Never  Other Topics Concern   Not on file  Social History Narrative   Right handed   Two story home   Drinks half and half coffee   Social Determinants of Health   Financial Resource Strain: Not on file  Food Insecurity: Not on file  Transportation Needs: Not on file  Physical Activity: Not on file  Stress: Not on file  Social Connections: Not on file     Family History:  The patient's family history includes Heart attack in his father and mother; Heart disease in his father, maternal grandmother, and mother; Hypertension in his father and sister.   ROS:   Please see the history of present illness.    ROS All other systems are reviewed and are negative.   PHYSICAL EXAM:   VS:  BP 132/72 (BP Location: Left Arm, Patient Position: Sitting, Cuff Size: Large)   Pulse 64   Ht 6' (1.829 m)   Wt 204 lb 12.8 oz (92.9 kg)   SpO2 97%   BMI 27.78 kg/m      General: Alert, oriented x3, no distress, appears well.  Unchanged large right submaxillary mass, cystic in consistency.  Healthy left subclavian CRT-the site Head: no evidence of trauma, PERRL, EOMI, no exophtalmos or lid lag, no myxedema, no xanthelasma; normal  ears, nose and oropharynx Neck: normal jugular venous pulsations and no hepatojugular reflux; brisk carotid pulses  without delay and no carotid bruits Chest: clear to auscultation, no signs of consolidation by percussion or palpation, normal fremitus, symmetrical and full respiratory excursions Cardiovascular: normal position and quality of the apical impulse, regular rhythm, normal first and second heart sounds, no murmurs, rubs or gallops Abdomen: no tenderness or distention, no masses by palpation, no abnormal pulsatility or arterial bruits, normal bowel sounds, no hepatosplenomegaly Extremities: no clubbing, cyanosis or edema; 2+ radial, ulnar and brachial pulses bilaterally; 2+ right femoral, posterior tibial and dorsalis pedis pulses; 2+ left femoral, posterior tibial and dorsalis pedis pulses; no subclavian or femoral bruits Neurological: grossly nonfocal Psych: Normal mood and affect   Wt Readings from Last 3 Encounters:  04/11/21 204 lb 12.8 oz (92.9 kg)  02/09/21 203 lb 6.4 oz (92.3 kg)  12/08/20 201 lb 3.2 oz (91.3 kg)      Studies/Labs Reviewed:   ECHO 12/01/2019  1. No LV thrombus identified.   2. Left ventricular ejection fraction, by estimation, is 20 to 25%. The  left ventricle has severely decreased function. The left ventricle  demonstrates global hypokinesis. Left ventricular diastolic function could  not be evaluated.   3. Right ventricular systolic function is normal. The right ventricular  size is normal. There is normal pulmonary artery systolic pressure.   4. 07/11/16 71mm Edwards Magna Mitral Valve Replacement      . The mitral valve has been repaired/replaced. No evidence of mitral  valve regurgitation. No evidence of mitral stenosis.   5. The aortic valve is normal in structure and function. Aortic valve  regurgitation is not visualized. No aortic stenosis is present.   6. The inferior vena cava is dilated in size with >50% respiratory  variability, suggesting right atrial pressure of 8 mmHg.     EKG:  EKG is not ordered today.  Intracardiac electrogram shows atrial  sensed, biventricular paced rhythm  Recent Labs: 02/01/2021: ALT 17; BUN 29; Creatinine, Ser 1.48; Potassium 4.5; Sodium 137; TSH 3.730   Lipid Panel    Component Value Date/Time   CHOL 122 10/17/2019 1225   CHOL 120 10/01/2018 0925   TRIG 78 10/17/2019 1225   HDL 37 (L) 10/17/2019 1225   HDL 31 (L) 10/01/2018 0925   CHOLHDL 3.3 10/17/2019 1225   VLDL 16 10/17/2019 1225   LDLCALC 69 10/17/2019 1225   LDLCALC 60 10/01/2018 0925    ASSESSMENT:    1. Chronic combined systolic and diastolic heart failure ([place])   2. Coronary artery disease of native artery of native heart with stable angina pectoris ([place])   3. History of atrial flutter   4. Status post mitral valve replacement with bioprosthetic valve   5. Presence of biventricular implantable cardioverter-defibrillator (ICD)   6. PVCs (premature ventricular contractions)   7. Essential hypertension   8. Mixed hyperlipidemia   9. Type 2 diabetes mellitus with stage 3b chronic kidney disease, without long-term current use of insulin ([place])   10. Stage 3b chronic kidney disease ([place])   11. History of stent insertion of renal artery       PLAN:  In order of problems listed above:  CHF: NYHA functional class II, able to exercise at the gym.  His episode of dizziness was related to mild hypotension, possibly due to frequent PVCs.  Asked him to avoid performing isometric exercises (he was doing [employer] with 45-50 pound weights at the time).  He should be able to continue breathing normally without straining during his exercises.  No change in medications.  He is on high-dose Entresto, carvedilol, eplerenone and digoxin.  At one point had some issues with hyperkalemia, but most recent potassium was normal at 4.5. CAD: He has not had any recent problems with angina pectoris. AFlutter: No evidence of sustained atrial arrhythmia on his device.  No longer on antiarrhythmics or anticoagulants. S/P MVR: Normal bioprosthetic valve  function by echo in March 2021.  Reaffirmed the need for endocarditis prophylaxis.  Consider repeat echocardiogram next spring. CRT-D: Normal device function.  He loses about 8% of his CRT due to very frequent PVCs.   PVCs: Ventricular bigeminy can cause artifactual bradycardia when using mechanical means of checking the pulse (peripheral pulse palpation, automatic blood pressure cuff, pulse oximeter).  Rhythm should always be evaluated by direct auscultation of the heart or some type of electrical tracing.  Obviously can also do a download on his defibrillator if symptoms are severe.  He is on maximum usual dose of carvedilol.  Amiodarone does not appear indicated. Hx HTN: Slightly low blood pressure, doing that 1 occurrence in the gym, but normal today.  Unclear whether this was due to hydration status or the frequent PVCs.  Would continue the same medicines. HLP: On statin with excellent LDL cholesterol, even with some improvement in his HDL cholesterol which is now only borderline low. DM: Excellent control.  Avoid TZD's.  Could add Jardiance or Wilder Glade for his heart failure. CKD 3b: Stable; stable creatinine around 1.5 (GFR 45-50) which predisposes him to hyperkalemia while receiving Entresto and eplerenone. History of left renal artery stenosis status post stent 2007.  Stable renal function and well-controlled hypertension. Submandibular mass: Reluctant to undergo general anesthesia which would be necessary for resection of this mass.  Also aware of the risk for cranial nerve palsy.  Precluded implantation of a Barostim device.    Medication Adjustments/Labs and Tests Ordered: Current medicines are reviewed at length with the patient today.  Concerns regarding medicines are outlined above.  Medication changes, Labs and Tests ordered today are listed in the Patient Instructions below. Patient Instructions  Medication Instructions:  No changes *If you need a refill on your cardiac medications  before your next appointment, please call your pharmacy*   Lab Work: None ordered  If you have labs (blood work) drawn today and your tests are completely normal, you will receive your results only by: [place] (if you have MyChart) OR A paper copy in the mail If you have any lab test that is abnormal or we need to change your treatment, we will call you to review the results.   Testing/Procedures: None ordered   Follow-Up: At [facility], you and your health needs are our priority.  As part of our continuing mission to provide you with exceptional heart care, we have created designated Provider Care Teams.  These Care Teams include your primary Cardiologist (physician) and Advanced Practice Providers (APPs -  Physician Assistants and Nurse Practitioners) who all work together to provide you with the care you need, when you need it.  We recommend signing up for the patient portal called "MyChart".  Sign up information is provided on this After Visit Summary.  MyChart is used to connect with patients for Virtual Visits (Telemedicine).  Patients are able to view lab/test results, encounter notes, upcoming appointments, etc.  Non-urgent messages can be sent to your provider as well.   To learn more  about what you can do with MyChart, go to [email or web address].    Your next appointment:   6 month(s)  The format for your next appointment:   In Person  Provider:   Sanda Klein, MD     Signed, Sanda Klein, MD  04/12/2021 11:57 AM    [address], [place], [place]  [address] Phone: [phone]; Fax: [phone]

## 2021-04-26 ENCOUNTER — Ambulatory Visit (INDEPENDENT_AMBULATORY_CARE_PROVIDER_SITE_OTHER): Payer: Medicare Other

## 2021-04-26 DIAGNOSIS — I5042 Chronic combined systolic (congestive) and diastolic (congestive) heart failure: Secondary | ICD-10-CM

## 2021-04-26 LAB — CUP PACEART REMOTE DEVICE CHECK
Battery Remaining Longevity: 22 mo
Battery Voltage: 2.93 V
Brady Statistic AP VP Percent: 69.43 %
Brady Statistic AP VS Percent: 2.04 %
Brady Statistic AS VP Percent: 27.68 %
Brady Statistic AS VS Percent: 0.84 %
Brady Statistic RA Percent Paced: 69.35 %
Brady Statistic RV Percent Paced: 93.63 %
Date Time Interrogation Session: 20220726042304
HighPow Impedance: 64 Ohm
Implantable Lead Implant Date: 20171024
Implantable Lead Implant Date: 20171024
Implantable Lead Implant Date: 20171024
Implantable Lead Location: 753858
Implantable Lead Location: 753859
Implantable Lead Location: 753860
Implantable Lead Model: 4598
Implantable Lead Model: 5076
Implantable Pulse Generator Implant Date: 20171024
Lead Channel Impedance Value: 1064 Ohm
Lead Channel Impedance Value: 1064 Ohm
Lead Channel Impedance Value: 249.509
Lead Channel Impedance Value: 249.509
Lead Channel Impedance Value: 267.31 Ohm
Lead Channel Impedance Value: 297.365
Lead Channel Impedance Value: 297.365
Lead Channel Impedance Value: 342 Ohm
Lead Channel Impedance Value: 399 Ohm
Lead Channel Impedance Value: 399 Ohm
Lead Channel Impedance Value: 456 Ohm
Lead Channel Impedance Value: 551 Ohm
Lead Channel Impedance Value: 551 Ohm
Lead Channel Impedance Value: 646 Ohm
Lead Channel Impedance Value: 665 Ohm
Lead Channel Impedance Value: 893 Ohm
Lead Channel Impedance Value: 931 Ohm
Lead Channel Impedance Value: 988 Ohm
Lead Channel Pacing Threshold Amplitude: 0.5 V
Lead Channel Pacing Threshold Amplitude: 0.875 V
Lead Channel Pacing Threshold Amplitude: 1 V
Lead Channel Pacing Threshold Pulse Width: 0.4 ms
Lead Channel Pacing Threshold Pulse Width: 0.4 ms
Lead Channel Pacing Threshold Pulse Width: 1 ms
Lead Channel Sensing Intrinsic Amplitude: 0.75 mV
Lead Channel Sensing Intrinsic Amplitude: 0.75 mV
Lead Channel Sensing Intrinsic Amplitude: 11.625 mV
Lead Channel Sensing Intrinsic Amplitude: 11.625 mV
Lead Channel Setting Pacing Amplitude: 1.5 V
Lead Channel Setting Pacing Amplitude: 2 V
Lead Channel Setting Pacing Amplitude: 2 V
Lead Channel Setting Pacing Pulse Width: 0.4 ms
Lead Channel Setting Pacing Pulse Width: 1 ms
Lead Channel Setting Sensing Sensitivity: 0.3 mV

## 2021-05-23 NOTE — Progress Notes (Signed)
Remote ICD transmission.   

## 2021-05-24 ENCOUNTER — Other Ambulatory Visit (HOSPITAL_COMMUNITY): Payer: Self-pay | Admitting: Cardiology

## 2021-06-05 ENCOUNTER — Other Ambulatory Visit (HOSPITAL_COMMUNITY): Payer: Self-pay | Admitting: Cardiology

## 2021-06-25 ENCOUNTER — Other Ambulatory Visit: Payer: Self-pay | Admitting: "Endocrinology

## 2021-06-25 DIAGNOSIS — N1831 Chronic kidney disease, stage 3a: Secondary | ICD-10-CM

## 2021-07-02 ENCOUNTER — Other Ambulatory Visit (HOSPITAL_COMMUNITY): Payer: Self-pay | Admitting: Cardiology

## 2021-07-06 ENCOUNTER — Other Ambulatory Visit: Payer: Self-pay

## 2021-07-06 ENCOUNTER — Encounter (HOSPITAL_COMMUNITY): Payer: Self-pay | Admitting: Cardiology

## 2021-07-06 ENCOUNTER — Ambulatory Visit (HOSPITAL_COMMUNITY)
Admission: RE | Admit: 2021-07-06 | Discharge: 2021-07-06 | Disposition: A | Discharge status: Home / Self Care | Payer: Medicare Other | Source: Ambulatory Visit | Attending: Cardiology | Admitting: Cardiology

## 2021-07-06 VITALS — BP 140/82 | HR 64 | Wt 203.8 lb

## 2021-07-06 DIAGNOSIS — I252 Old myocardial infarction: Secondary | ICD-10-CM | POA: Insufficient documentation

## 2021-07-06 DIAGNOSIS — Z8679 Personal history of other diseases of the circulatory system: Secondary | ICD-10-CM | POA: Diagnosis not present

## 2021-07-06 DIAGNOSIS — Z7984 Long term (current) use of oral hypoglycemic drugs: Secondary | ICD-10-CM | POA: Diagnosis not present

## 2021-07-06 DIAGNOSIS — Z7982 Long term (current) use of aspirin: Secondary | ICD-10-CM | POA: Insufficient documentation

## 2021-07-06 DIAGNOSIS — Z8774 Personal history of (corrected) congenital malformations of heart and circulatory system: Secondary | ICD-10-CM | POA: Insufficient documentation

## 2021-07-06 DIAGNOSIS — I2510 Atherosclerotic heart disease of native coronary artery without angina pectoris: Secondary | ICD-10-CM | POA: Insufficient documentation

## 2021-07-06 DIAGNOSIS — N183 Chronic kidney disease, stage 3 unspecified: Secondary | ICD-10-CM | POA: Insufficient documentation

## 2021-07-06 DIAGNOSIS — Z09 Encounter for follow-up examination after completed treatment for conditions other than malignant neoplasm: Secondary | ICD-10-CM | POA: Insufficient documentation

## 2021-07-06 DIAGNOSIS — Z8249 Family history of ischemic heart disease and other diseases of the circulatory system: Secondary | ICD-10-CM | POA: Diagnosis not present

## 2021-07-06 DIAGNOSIS — I255 Ischemic cardiomyopathy: Secondary | ICD-10-CM | POA: Insufficient documentation

## 2021-07-06 DIAGNOSIS — R079 Chest pain, unspecified: Secondary | ICD-10-CM | POA: Diagnosis present

## 2021-07-06 DIAGNOSIS — Z8673 Personal history of transient ischemic attack (TIA), and cerebral infarction without residual deficits: Secondary | ICD-10-CM | POA: Diagnosis not present

## 2021-07-06 DIAGNOSIS — R0602 Shortness of breath: Secondary | ICD-10-CM | POA: Diagnosis not present

## 2021-07-06 DIAGNOSIS — I340 Nonrheumatic mitral (valve) insufficiency: Secondary | ICD-10-CM | POA: Diagnosis not present

## 2021-07-06 DIAGNOSIS — I4892 Unspecified atrial flutter: Secondary | ICD-10-CM | POA: Diagnosis not present

## 2021-07-06 DIAGNOSIS — Z79899 Other long term (current) drug therapy: Secondary | ICD-10-CM | POA: Diagnosis not present

## 2021-07-06 DIAGNOSIS — E1122 Type 2 diabetes mellitus with diabetic chronic kidney disease: Secondary | ICD-10-CM | POA: Insufficient documentation

## 2021-07-06 DIAGNOSIS — I5022 Chronic systolic (congestive) heart failure: Secondary | ICD-10-CM | POA: Diagnosis not present

## 2021-07-06 DIAGNOSIS — E782 Mixed hyperlipidemia: Secondary | ICD-10-CM

## 2021-07-06 DIAGNOSIS — Z7902 Long term (current) use of antithrombotics/antiplatelets: Secondary | ICD-10-CM | POA: Diagnosis not present

## 2021-07-06 DIAGNOSIS — Z7901 Long term (current) use of anticoagulants: Secondary | ICD-10-CM | POA: Diagnosis not present

## 2021-07-06 DIAGNOSIS — Z953 Presence of xenogenic heart valve: Secondary | ICD-10-CM | POA: Diagnosis not present

## 2021-07-06 LAB — BASIC METABOLIC PANEL
Anion gap: 6 (ref 5–15)
BUN: 26 mg/dL — ABNORMAL HIGH (ref 8–23)
CO2: 27 mmol/L (ref 22–32)
Calcium: 9 mg/dL (ref 8.9–10.3)
Chloride: 99 mmol/L (ref 98–111)
Creatinine, Ser: 1.38 mg/dL — ABNORMAL HIGH (ref 0.61–1.24)
GFR, Estimated: 54 mL/min — ABNORMAL LOW (ref >60)
Glucose, Bld: 158 mg/dL — ABNORMAL HIGH (ref 70–99)
Potassium: 4.5 mmol/L (ref 3.5–5.1)
Sodium: 132 mmol/L — ABNORMAL LOW (ref 135–145)

## 2021-07-06 LAB — LIPID PANEL
Cholesterol: 132 mg/dL (ref 0–200)
HDL: 34 mg/dL — ABNORMAL LOW (ref >40)
LDL Cholesterol: 71 mg/dL (ref 0–99)
Total CHOL/HDL Ratio: 3.9 RATIO
Triglycerides: 135 mg/dL (ref <150)
VLDL: 27 mg/dL (ref 0–40)

## 2021-07-06 LAB — DIGOXIN LEVEL: Digoxin Level: 0.6 ng/mL — ABNORMAL LOW (ref 0.8–2.0)

## 2021-07-06 MED ORDER — FUROSEMIDE 20 MG PO TABS: 20.0000 mg | ORAL_TABLET | Freq: Every day | ORAL | 3 refills | Status: DC | PRN

## 2021-07-06 NOTE — Patient Instructions (Signed)
EKG done today.  Labs done today. We will contact you only if your labs are abnormal.  No medication changes were made. Please continue all current medications as prescribed.  Your physician recommends that you schedule a follow-up appointment in: 6 months. Please contact our office in March 2023 to schedule an April 2023 appointment.   If you have any questions or concerns before your next appointment please send [place] a message through [place] or call our office at [phone].    TO LEAVE A MESSAGE FOR THE NURSE SELECT OPTION 2, PLEASE LEAVE A MESSAGE INCLUDING: YOUR NAME DATE OF BIRTH CALL BACK NUMBER REASON FOR CALL**this is important as we prioritize the call backs  YOU WILL RECEIVE A CALL BACK THE SAME DAY AS LONG AS YOU CALL BEFORE 4:00 PM   Do the following things EVERYDAY: Weigh yourself in the morning before breakfast. Write it down and keep it in a log. Take your medicines as prescribed Eat low salt foods--Limit salt (sodium) to 2000 mg per day.  Stay as active as you can everyday Limit all fluids for the day to less than 2 liters   At the [facility], you and your health needs are our priority. As part of our continuing mission to provide you with exceptional heart care, we have created designated Provider Care Teams. These Care Teams include your primary Cardiologist (physician) and Advanced Practice Providers (APPs- Physician Assistants and Nurse Practitioners) who all work together to provide you with the care you need, when you need it.   You may see any of the following providers on your designated Care Team at your next follow up: Dr Glori Bickers Dr Haynes Kerns, NP Lyda Jester, [place] Audry Riles, PharmD   Please be sure to bring in all your medications bottles to every appointment.

## 2021-07-06 NOTE — Progress Notes (Signed)
Date:  07/06/2021   ID:  Christian Bowman, DOB 1946-07-03, MRN [id number]   Provider location: [facility] Type of Visit: Established patient   PCP:  Sharilyn Sites, MD  Cardiologist: Dr. Sallyanne Kuster  HF Cardiologist:  Loralie Champagne, MD   History of Present Illness: Christian Bowman is a 75 y.o. male with history of CAD s/p CABG in 2007 then redo CABG in 10/17 with mitral valve replacement.  He was admitted in 10/17 for redo CABG with SVG-PDA and SVG-ramus.  He also had mitral valve replacement with a bioprosthetic valve because of infarct-related mitral regurgitation.  Post-operative course was complicated by CHF requiring diuresis.  He also had atrial flutter and required DCCV.  Due to complete heart block, he later got a CRT-D system.     At a prior visit, he was in atrial flutter.  He saw Dr. Curt Bears, it was decided to arrange for DCCV with ablation down the road when PPM leads have been in longer.  He had successful DCCV in 12/17 and is in NSR today.    He was admitted in 3/18 with NSTEMI and chest pain. TnI only 0.5.  LHC showed occlusion of SVG-PDA from CABG#1 to be the likely culprit.  However, SVG-PDA from CABG#2 was patent.  No intervention.  Echo in 3/18 from [place] showed EF 30-35%, stable bioprosthetic mitral valve.    He had atrial flutter ablation in 8/18.  He is in NSR today and is off amiodarone.     In 11/18, he had an inguinal hernia repair. Post-op, he had an NSTEMI.  LHC showed occlusion of a PLV branch that had been backfilled by SVG-PDA (prior cath had shown severe diffuse disease in the PLV).  Echo in 11/18 showed EF 20-25% with normal bioprosthetic mitral valve.     With no recurrence of atrial arrhythmias, he is now off anticoagulation.   Echo in 7/20 showed EF 25-30%, bioprosthetic MV with no MR and mean gradient 5 mmHg, normal RV.    In 1/21, he developed right hand weakness and dysarthria concerning for CVA.  He went to the ER in [place] and  had tPA.  Symptoms resolved.  Atrial fibrillation was not noted per his wife's report.  CTA head/neck showed no significant carotid stenosis.  He was started on Plavix 75 mg daily and sent home.    CPX (3/21) showed severe functional impairment due to HF.  Echo showed EF 20-25%, normal RV, normally functioning bioprosthetic mitral valve.  RHC in 4/21 showed low filling pressures and low cardiac index (2.1 Fick, 1.9 thermo).  He saw Dr. Orvan Seen to discuss possible lateral thoracotomy access LVAD. He decided against LVAD placement.    He returns for followup of CHF.   He is going to maintenance cardiac rehab in [place] twice a week.  He has occasional chest pain when he gets "upset."  This is fairly rare, he has taken NTG a couple of times since last visit.  No exertional dyspnea.  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 No dyspnea walking on flat ground, very mild dyspnea with a flight of stairs.  Weight is fairly stable.   ECG (personally reviewed): A-BiV paced with PACs   Labs (11/17): K 4.8, creatinine 1.89, hgb 12.3, digoxin 0.9, LFTs normal, TSH 8.235 (mild increase), free T3 low, free T4 normal, LFTs normal.  Labs (12/17):  K 4.5, creatinine 1.7, BNP 3909 Labs (3/18): K 3.9, creatinine 1.48, hgb 11.3 Labs (4/18): digoxin 0.5, LFTs normal Labs (7/18): K 5.1, creatinine 1.78, LDL 63, HDL 29, TSH elevated Labs (11/18): K 4.5, creatinine 1.86, hgb 13.6 Labs (12/18): K 5, creatinine 1.88 Labs (1/19): digoxin 0.3, K 4.7, creatinine 1.83 Labs (6/19): LDL 70, HDL 32, K 5.1, creatinine 1.88 Labs (12/19): LDL 60, HDL 31 Labs (1/20): K 5.1, creatinine 1.64 Labs (4/20): K 4.5, creatinine 1.77 Labs (8/20): K 4.6, creatinine 1.5 Labs (1/21): K 4.5, creatinine 1.58, LDL 69 Labs (2/21): K 4.5, creatinine 1.66 Labs (4/21): K 4.1, creatinine 1.63, digoxin 0.4 Labs (5/21): K 4.4, creatinine 1.51 Labs (6/21): K 4.4,  creatinine 1.51, digoxin 0.3 Labs (12/21): K 4.9, creatinine 1.69 Labs (5/22): K 4.5, creatinine 1.48   PMH: 1. CAD: CABG 2007.  - LHC (9/17) with patent LIMA-LAD, totally occluded SVG-D, severe stenosis in SVG-PDA.  - Redo CABG 10/17 with SVG-PDA, SVG-ramus and mitral valve replacement.  - NSTEMI 3/18.  LHC with culprit lesion likely occlusion of SVG-PDA from CABG#1.  Patent SVG-PDA from CABG#2.  No intervention.  - NSTEMI 11/18 post-op inguinal hernia repair.  LHC with occlusion of a PLV branch that had been backfilled by SVG-PDA (prior cath had shown severe diffuse disease in the PLV).  2. Mitral regurgitation: Ischemic MR, mitral valve replacement was done in 10/17 (bioprosthetic). 3. Complete heart block: Post-op in 10/17.  [employer] CRT-D placed.  4. Atrial flutter: DCCV 10/17 and again in 12/17.  - Ablation 8/18, now off amiodarone.  5. Type II diabetes 6. Hyperlipidemia 7. Chronic systolic CHF: Ischemic cardiomyopathy.   - TEE (10/17): EF 20-25%, severe LV dilation - Echo (3/18, [place]): EF 30-35%, bioprosthetic mitral valve with mean gradient 5 mmHg.  - Echo (11/18): EF 20-25%, moderate LV dilation, normal bioprosthetic mitral valve.  - Echo (7/20): EF 25-30%, moderate LV dilation, inferior/inferolateral AK, bioprosthetic MV with mean gradient 5 and no MR, normal RV.  - Painful gynecomastia with spironolactone.  - Echo (1/21): EF < 20%, normal RV size and systolic function, bioprosthetic mitral valve with mild MR, mean gradient 2 mmHg.  - CPX (3/21): peak VO2 11.2, VE/VCO2 slope 56, RER 1.21.  - Echo (3/21): EF 20-25%, no LV thrombus, RV normal, bioprosthetic mitral valve appear normal.  - RHC (4/21): mean RA 3, PA 25/7, mean PCWP 11, CI 2.1 (Fick), 1.9 (thermo).  8. Hypothyroidism 9. CVA 1/21: CTA head/neck with no carotid stenosis.   Current Outpatient Medications  Medication Sig Dispense Refill   amoxicillin (AMOXIL) 500 MG tablet Take 4 tablets (2,000 mg total) by mouth  See admin instructions. Take 2000 mg 1 hour prior to dental work 4 tablet 0   aspirin EC 81 MG tablet Take 81 mg by mouth daily.      carvedilol (COREG) 12.5 MG tablet Take 1.5 tablets (18.75 mg total) by mouth 2 (two) times daily with a meal. 90 tablet 11   cholecalciferol (VITAMIN D3) 25 MCG (1000 UNIT) tablet Take 2,000 Units by mouth daily with breakfast.     clopidogrel (PLAVIX) 75 MG tablet Take 1 tablet by mouth once daily 90 tablet 3   digoxin (LANOXIN) 0.125 MG tablet Take 1/2 (one-half) tablet by mouth once daily 45 tablet 4   ENTRESTO 97-103 MG Take 1 tablet by mouth twice daily 180 tablet 3   eplerenone (INSPRA) 50 MG tablet Take 1 tablet by mouth once daily 30 tablet 3   levothyroxine (SYNTHROID) 50 MCG  tablet Take 1 pill daily before breakfast 90 tablet 1   LOKELMA 10 g PACK packet DISSOLVE 1 PACKET IN WATER & DRINK ONCE DAILY 90 each 0   Melatonin 1 MG CAPS Take 2 mg by mouth daily as needed (sleep).     neomycin-bacitracin-polymyxin (NEOSPORIN) ointment Apply 1 application topically daily as needed for wound care.     nitroGLYCERIN (NITROSTAT) 0.4 MG SL tablet DISSOLVE ONE TABLET UNDER THE TONGUE EVERY 5 MINUTES AS NEEDED FOR CHEST PAIN.  DO NOT EXCEED A TOTAL OF 3 DOSES IN 15 MINUTES 25 tablet 2   polyethylene glycol (MIRALAX / GLYCOLAX) packet Take 17 g by mouth daily as needed for mild constipation or moderate constipation.     rosuvastatin (CRESTOR) 10 MG tablet Take 1 tablet by mouth once daily 90 tablet 3   Tetrahydrozoline HCl (VISINE OP) Place 1 drop into both eyes daily as needed (irritation).     TRADJENTA 5 MG TABS tablet Take 1 tablet by mouth once daily 90 tablet 0   furosemide (LASIX) 20 MG tablet Take 1 tablet (20 mg total) by mouth daily as needed for fluid or edema. 45 tablet 3   No current facility-administered medications for this encounter.    Allergies:   Xanax [alprazolam]   Social History:  The patient  reports that he has never smoked. He has never used  smokeless tobacco. He reports that he does not drink alcohol and does not use drugs.   Family History:  The patient's family history includes Heart attack in his father and mother; Heart disease in his father, maternal grandmother, and mother; Hypertension in his father and sister.   ROS:  Please see the history of present illness.   All other systems are personally reviewed and negative.   Exam:   BP 140/82   Pulse 64   Wt 92.4 kg (203 lb 12.8 oz)   SpO2 98%   BMI 27.64 kg/m   General: NAD Neck: No JVD, no thyromegaly or thyroid nodule.  Lungs: Clear to auscultation bilaterally with normal respiratory effort. CV: Nondisplaced PMI.  Heart regular S1/S2, no S3/S4, no murmur.  No peripheral edema.  No carotid bruit.  Normal pedal pulses.  Abdomen: Soft, nontender, no hepatosplenomegaly, no distention.  Skin: Intact without lesions or rashes.  Neurologic: Alert and oriented x 3.  Psych: Normal affect. Extremities: No clubbing or cyanosis.  HEENT: Large lipoma right neck (stable)  Recent Labs: 02/01/2021: ALT 17; TSH 3.730 07/06/2021: BUN 26; Creatinine, Ser 1.38; Potassium 4.5; Sodium 132  Personally reviewed   Wt Readings from Last 3 Encounters:  07/06/21 92.4 kg (203 lb 12.8 oz)  04/11/21 92.9 kg (204 lb 12.8 oz)  02/09/21 92.3 kg (203 lb 6.4 oz)    ASSESSMENT AND PLAN:  1. Chronic systolic CHF: Ischemic cardiomyopathy.  TEE 10/17 with EF 20-25%.  Echo in 3/21 shoed EF 20-25%, normal RV function, normal bioprosthetic mitral valve.  CPX in 3/21 showed severe functional impairment due to HF with concern for poor short-term prognosis.  RHC in 4/21 showed normal filling pressures but low cardiac index.  I talked to him about advanced therapies given poor prognosis with low cardiac output. He is not a transplant candidate.  He has had 2 prior sternotomies, which makes LVAD more complicated.  He talked with Dr Orvan Seen about LVAD approach from left lateral thoracotomy.  We have discussed LVAD  extensively.  He is very clear that he does not want another cardiac surgery. Symptoms somewhat  better recently, NYHA class II. He is not volume overloaded by exam.  - Continue to use Lasix prn.    - Continue Entresto 97/103 bid. BMET today.  - Gynecomastia with spironolactone.  Continue eplerenone 50 mg daily.     - Continue Coreg 18.75 mg bid.  He did not tolerate increasing Coreg to 25 mg bid.       - He has been on Lokelma chronically to control hyperkalemia and allow use of eplerenone and Entresto, continue  - His wife is concerned about using [place] and says that his endocrinologist did not want him to start it.  I think it would be quite helpful with CKD and CHF.  I again recommended Farxiga 10 mg daily (or Jardiance 10 mg daily).  He will see his endocrinologist soon and will bring this up with him again.  I would strongly recommend starting it given significant cardiac and renal benefit.    - Continue digoxin 0.0625 daily.  Check digoxin level.  - As he has turned down LVAD, we discussed vagal nerve stimulation (ANTHEM trial) to help with his symptoms. His right neck lipoma poses a problem for vagal lead placement, however. He would need the lipoma removed.   2. CAD: s/p redo CABG.  Admission in 3/18 with NSTEMI, LHC showed occlusion of SVG-PDA from CABG#1 but patent SVG-PDA from CABG#2, no intervention.  He had a post-op NSTEMI after inguinal hernia repair in 11/18.  LHC showed occlusion of a PLV branch that had been backfilled by SVG-PDA (prior cath had shown severe diffuse disease in the PLV). Minimal recent chest pain, atypical (related to emotional upset).      - Continue statin => Crestor 10 mg daily. Check lipids today.     - Continue ASA 81. 3. Bioprosthetic mitral valve: Stable 3/21 echo with no significant regurgitation or stenosis.   4. Atrial flutter: s/p ablation in 8/18.  He is in NSR and off amiodarone. He is in NSR today.  - Now off warfarin with flutter ablation and no  recurrence. If he has recurrence of atrial arrhythmias, based on most recent data DOAC would be a reasonable choice for him even with bioprosthetic MV.  5. CKD: Stage III.  BMET.   6. Type II diabetes: Followed by endocrinology. 7. CVA: In 1/21, got tPA at the ER in [place].  Symptoms resolved.  CTA head/neck showed no carotid stenosis.  Device interrogation has shown no atrial fibrillation. If atrial fibrillation is noted on future monitoring, will need anticoagulation.  - Continue ASA and Plavix 75 daily.  - Continue Crestor.   Followup with Dr. Sallyanne Kuster in 3 months and me in 6 months.   Signed, Loralie Champagne, MD  07/06/2021  [facility] [address] [facility] [place] [address] [phone] (office) [phone] (fax)

## 2021-07-25 ENCOUNTER — Encounter: Payer: Self-pay | Admitting: Cardiology

## 2021-07-25 ENCOUNTER — Ambulatory Visit (INDEPENDENT_AMBULATORY_CARE_PROVIDER_SITE_OTHER): Payer: Medicare Other | Admitting: Cardiology

## 2021-07-25 ENCOUNTER — Other Ambulatory Visit: Payer: Self-pay

## 2021-07-25 VITALS — BP 134/78 | HR 70 | Ht 72.0 in | Wt 201.0 lb

## 2021-07-25 DIAGNOSIS — I5022 Chronic systolic (congestive) heart failure: Secondary | ICD-10-CM

## 2021-07-25 DIAGNOSIS — I255 Ischemic cardiomyopathy: Secondary | ICD-10-CM | POA: Diagnosis not present

## 2021-07-25 LAB — CUP PACEART INCLINIC DEVICE CHECK
Battery Remaining Longevity: 21 mo
Battery Voltage: 2.92 V
Brady Statistic AP VP Percent: 72.43 %
Brady Statistic AP VS Percent: 3.24 %
Brady Statistic AS VP Percent: 23.78 %
Brady Statistic AS VS Percent: 0.54 %
Brady Statistic RA Percent Paced: 73.78 %
Brady Statistic RV Percent Paced: 92.36 %
Date Time Interrogation Session: 20221024162111
HighPow Impedance: 68 Ohm
Implantable Lead Implant Date: 20171024
Implantable Lead Implant Date: 20171024
Implantable Lead Implant Date: 20171024
Implantable Lead Location: 753858
Implantable Lead Location: 753859
Implantable Lead Location: 753860
Implantable Lead Model: 4598
Implantable Lead Model: 5076
Implantable Pulse Generator Implant Date: 20171024
Lead Channel Impedance Value: 1026 Ohm
Lead Channel Impedance Value: 1026 Ohm
Lead Channel Impedance Value: 274.19 Ohm
Lead Channel Impedance Value: 274.19 Ohm
Lead Channel Impedance Value: 274.19 Ohm
Lead Channel Impedance Value: 294.5 Ohm
Lead Channel Impedance Value: 294.5 Ohm
Lead Channel Impedance Value: 361 Ohm
Lead Channel Impedance Value: 399 Ohm
Lead Channel Impedance Value: 475 Ohm
Lead Channel Impedance Value: 513 Ohm
Lead Channel Impedance Value: 589 Ohm
Lead Channel Impedance Value: 589 Ohm
Lead Channel Impedance Value: 589 Ohm
Lead Channel Impedance Value: 703 Ohm
Lead Channel Impedance Value: 950 Ohm
Lead Channel Impedance Value: 988 Ohm
Lead Channel Impedance Value: 988 Ohm
Lead Channel Pacing Threshold Amplitude: 0.5 V
Lead Channel Pacing Threshold Amplitude: 0.875 V
Lead Channel Pacing Threshold Amplitude: 1.125 V
Lead Channel Pacing Threshold Pulse Width: 0.4 ms
Lead Channel Pacing Threshold Pulse Width: 0.4 ms
Lead Channel Pacing Threshold Pulse Width: 1 ms
Lead Channel Sensing Intrinsic Amplitude: 0.5 mV
Lead Channel Sensing Intrinsic Amplitude: 0.625 mV
Lead Channel Sensing Intrinsic Amplitude: 13.875 mV
Lead Channel Sensing Intrinsic Amplitude: 14.875 mV
Lead Channel Setting Pacing Amplitude: 1.75 V
Lead Channel Setting Pacing Amplitude: 2 V
Lead Channel Setting Pacing Amplitude: 2 V
Lead Channel Setting Pacing Pulse Width: 0.4 ms
Lead Channel Setting Pacing Pulse Width: 1 ms
Lead Channel Setting Sensing Sensitivity: 0.3 mV

## 2021-07-25 NOTE — Progress Notes (Signed)
Electrophysiology Office Note   Date:  07/25/2021   ID:  Christian Bowman, DOB 21-Mar-1946, MRN [id number]  PCP:  Sharilyn Sites, MD  Cardiologist:  Aundra Dubin Primary Electrophysiologist:  Jancy Sprankle Meredith Leeds, MD    No chief complaint on file.    History of Present Illness: Christian Bowman is a 75 y.o. male who presents today for electrophysiology evaluation.     He has a history significant for coronary artery disease status post CABG in 2007 with redo CABG September 2017 and mitral valve replacement.  He had a bioprosthetic mitral valve due to infarct-related mitral regurgitation.  Postoperatively, he developed complete heart block and is status post Saint Jude CRT-D implanted 07/25/2016.  He subsequently went into atrial flutter and is status post atrial flutter ablation 05/11/2017.  Today, denies symptoms of palpitations, chest pain, shortness of breath, orthopnea, PND, lower extremity edema, claudication, dizziness, presyncope, syncope, bleeding, or neurologic sequela. The patient is tolerating medications without difficulties.  He currently feels well.  He has no chest pain or shortness of breath.  Is able do all his daily activities without restriction.  He is happy with the way he has been feeling.   Past Medical History:  Diagnosis Date   AICD (automatic cardioverter/defibrillator) present    medtronic-   DR. CROITORU , DR. Aundra Dubin    Anginal pain ([place])    cp sat 08/11/17   Anxiety    CAD (coronary artery disease)    CHF (congestive heart failure) ([place])    Complication of anesthesia    took awhile to wake up    Coronary artery disease involving coronary bypass graft    Cyst of neck    right side   DM2 (diabetes mellitus, type 2) ([place]) 08/26/2013   Dyspnea    Heart attack ([place])    "not sure when" (08/20/2017)   HTN (hypertension) 08/26/2013   Hyperlipidemia 08/26/2013   Hypothyroidism    Left main coronary artery disease    Left renal artery stenosis ([place])    Genesis 6x12  stent 2007   Obesity (BMI 30.0-34.9) 08/26/2013   Postoperative atrial fibrillation ([place]) 10/15/2005   Presence of permanent cardiac pacemaker    S/P CABG x 4 10/13/2005   LIMA to LAD, SVG to intermediate branch, sequential SVG to PDA and RPL branch, EVH via right thigh   S/P mitral valve replacement with bioprosthetic valve 07/11/2016   31 mm [facility] Mitral bovine bioprosthetic tissue valve   S/P redo CABG x 2 07/11/2016   SVG to PDA and SVG to Intermediate Branch, EVH via left thigh   Past Surgical History:  Procedure Laterality Date   A-FLUTTER ABLATION N/A 05/11/2017   Procedure: A-Flutter Ablation;  Surgeon: Constance Haw, MD;  Location: [place] [facility];  Service: Cardiovascular;  Laterality: N/A;   CARDIAC CATHETERIZATION N/A 06/21/2016   Procedure: Right/Left Heart Cath and Coronary/Graft Angiography;  Surgeon: Sherren Mocha, MD;  Location: [place] [facility];  Service: Cardiovascular;  Laterality: N/A;   CARDIAC VALVE REPLACEMENT     CARDIOVERSION N/A 07/19/2016   Procedure: CARDIOVERSION;  Surgeon: Lelon Perla, MD;  Location: [facility];  Service: Cardiovascular;  Laterality: N/A;   CARDIOVERSION N/A 09/08/2016   Procedure: CARDIOVERSION;  Surgeon: Fay Records, MD;  Location: [place];  Service: Cardiovascular;  Laterality: N/A;   CORONARY ANGIOPLASTY     STENT 2016  [facility]   CORONARY ANGIOPLASTY WITH STENT PLACEMENT     DES in SVG to  right coronary artery system   CORONARY ARTERY BYPASS GRAFT  10/13/2005   LIMA to LAD, SVG to intermediate branch, sequential SVG to PDA and RPL   CORONARY ARTERY BYPASS GRAFT N/A 07/11/2016   Procedure: REDO CORONARY ARTERY BYPASS GRAFTING (CABG) x two using left leg greater saphenous vein harvested endoscopically-SVG to PDA -SVG to RAMUS INTERMEDIATE;  Surgeon: Rexene Alberts, MD;  Location: [place];  Service: Open Heart Surgery;  Laterality: N/A;   CORONARY ARTERY BYPASS GRAFT N/A 07/11/2016   Procedure:  Re-exploration (CABG) for post op bleeding,;  Surgeon: Rexene Alberts, MD;  Location: [place];  Service: Open Heart Surgery;  Laterality: N/A;   EP IMPLANTABLE DEVICE N/A 07/25/2016   Procedure: BiV ICD Insertion CRT-D;  Surgeon: Jabir Dahlem Meredith Leeds, MD;  Location: [place] [facility];  Service: Cardiovascular;  Laterality: N/A;   INGUINAL HERNIA REPAIR Left 08/20/2017   INGUINAL HERNIA REPAIR Left 08/20/2017   Procedure: OPEN LEFT INGUINAL HERNIA REPAIR;  Surgeon: Alphonsa Overall, MD;  Location: [place];  Service: General;  Laterality: Left;   LEFT HEART CATH AND CORS/GRAFTS ANGIOGRAPHY N/A 12/18/2016   Procedure: Left Heart Cath and Cors/Grafts Angiography;  Surgeon: Sherren Mocha, MD;  Location: [place] [facility];  Service: Cardiovascular;  Laterality: N/A;   LEFT HEART CATH AND CORS/GRAFTS ANGIOGRAPHY N/A 08/22/2017   Procedure: LEFT HEART CATH AND CORS/GRAFTS ANGIOGRAPHY;  Surgeon: Larey Dresser, MD;  Location: [place] [facility];  Service: Cardiovascular;  Laterality: N/A;   MITRAL VALVE REPLACEMENT N/A 07/11/2016   Procedure: MITRAL VALVE (MV) REPLACEMENT;  Surgeon: Rexene Alberts, MD;  Location: [place];  Service: Open Heart Surgery;  Laterality: N/A;   MYOCARDICAL PERFUSION  10/09/2007   NORMAL PERFUSION IN ALL REGIONS;NO EVIDENCE OF INDUCIBLE ISCHEMIA;POST STRESS EF% 66   RENAL ARTERY STENT Right 2007   RENAL DOPPLER  03/28/2010   RIGHT RA-NORMAL;LEFT PROXIMAL RA AT STENT-PATENT WITH NO EVIDENCE OF SIGN DIAMETER REDUCTION. R & L KIDNEYS: EQUAL IN SIZE,SYMMETRICAL IN SHAPE.   RIGHT HEART CATH N/A 01/08/2020   Procedure: RIGHT HEART CATH;  Surgeon: Larey Dresser, MD;  Location: [place] [facility];  Service: Cardiovascular;  Laterality: N/A;   TEE WITHOUT CARDIOVERSION N/A 06/15/2016   Procedure: TRANSESOPHAGEAL ECHOCARDIOGRAM (TEE);  Surgeon: Sanda Klein, MD;  Location: [facility] ENDOSCOPY;  Service: Cardiovascular;  Laterality: N/A;   TEE WITHOUT CARDIOVERSION N/A 07/11/2016   Procedure:  TRANSESOPHAGEAL ECHOCARDIOGRAM (TEE);  Surgeon: Rexene Alberts, MD;  Location: [place];  Service: Open Heart Surgery;  Laterality: N/A;   TEE WITHOUT CARDIOVERSION N/A 07/19/2016   Procedure: TRANSESOPHAGEAL ECHOCARDIOGRAM (TEE);  Surgeon: Lelon Perla, MD;  Location: [facility] ENDOSCOPY;  Service: Cardiovascular;  Laterality: N/A;   TRANSESOPHAGEAL ECHOCARDIOGRAM  10/19/2005   NORMAL LV; MILD TO MODERATE AMOUNT OF SOFT ATHEROMATOUS PLAQUE OF THE THORACIC AORTA; THE LEFT ATRIUM IS MILDLY DILATED;LEFT ATRIAL APPENDAGE FUNCTION IS NORMAL;NO THROMBUS IDENTIFIED. SMALL PFO WITH RIGHT TO LEFT SHUNT     Current Outpatient Medications  Medication Sig Dispense Refill   amoxicillin (AMOXIL) 500 MG tablet Take 4 tablets (2,000 mg total) by mouth See admin instructions. Take 2000 mg 1 hour prior to dental work 4 tablet 0   aspirin EC 81 MG tablet Take 81 mg by mouth daily.      carvedilol (COREG) 12.5 MG tablet Take 1.5 tablets (18.75 mg total) by mouth 2 (two) times daily with a meal. 90 tablet 11   cholecalciferol (VITAMIN D3) 25 MCG (1000 UNIT) tablet  Take 2,000 Units by mouth daily with breakfast.     clopidogrel (PLAVIX) 75 MG tablet Take 1 tablet by mouth once daily 90 tablet 3   digoxin (LANOXIN) 0.125 MG tablet Take 1/2 (one-half) tablet by mouth once daily 45 tablet 4   ENTRESTO 97-103 MG Take 1 tablet by mouth twice daily 180 tablet 3   eplerenone (INSPRA) 50 MG tablet Take 1 tablet by mouth once daily 30 tablet 3   furosemide (LASIX) 20 MG tablet Take 1 tablet (20 mg total) by mouth daily as needed for fluid or edema. 45 tablet 3   levothyroxine (SYNTHROID) 50 MCG tablet Take 1 pill daily before breakfast 90 tablet 1   LOKELMA 10 g PACK packet DISSOLVE 1 PACKET IN WATER & DRINK ONCE DAILY 90 each 0   Melatonin 1 MG CAPS Take 2 mg by mouth daily as needed (sleep).     neomycin-bacitracin-polymyxin (NEOSPORIN) ointment Apply 1 application topically daily as needed for wound care.     nitroGLYCERIN  (NITROSTAT) 0.4 MG SL tablet DISSOLVE ONE TABLET UNDER THE TONGUE EVERY 5 MINUTES AS NEEDED FOR CHEST PAIN.  DO NOT EXCEED A TOTAL OF 3 DOSES IN 15 MINUTES 25 tablet 2   polyethylene glycol (MIRALAX / GLYCOLAX) packet Take 17 g by mouth daily as needed for mild constipation or moderate constipation.     rosuvastatin (CRESTOR) 10 MG tablet Take 1 tablet by mouth once daily 90 tablet 3   Tetrahydrozoline HCl (VISINE OP) Place 1 drop into both eyes daily as needed (irritation).     TRADJENTA 5 MG TABS tablet Take 1 tablet by mouth once daily 90 tablet 0   No current facility-administered medications for this visit.    Allergies:   Xanax [alprazolam]   Social History:  The patient  reports that he has never smoked. He has never used smokeless tobacco. He reports that he does not drink alcohol and does not use drugs.   Family History:  The patient's family history includes Heart attack in his father and mother; Heart disease in his father, maternal grandmother, and mother; Hypertension in his father and sister.   ROS:  Please see the history of present illness.   Otherwise, review of systems is positive for none.   All other systems are reviewed and negative.   PHYSICAL EXAM: VS:  BP 134/78   Pulse 70   Ht 6' (1.829 m)   Wt 201 lb (91.2 kg)   SpO2 98%   BMI 27.26 kg/m  , BMI Body mass index is 27.26 kg/m. GEN: Well nourished, well developed, in no acute distress  HEENT: normal  Neck: no JVD, carotid bruits, or masses Cardiac: RRR; no murmurs, rubs, or gallops,no edema  Respiratory:  clear to auscultation bilaterally, normal work of breathing GI: soft, nontender, nondistended, + BS MS: no deformity or atrophy  Skin: warm and dry, device site well healed Neuro:  Strength and sensation are intact Psych: euthymic mood, full affect  EKG:  EKG is not ordered today. Personal review of the ekg ordered 07/06/21 shows AV paced  Personal review of the device interrogation today. Results in  [place]: 02/01/2021: ALT 17; TSH 3.730 07/06/2021: BUN 26; Creatinine, Ser 1.38; Potassium 4.5; Sodium 132    Lipid Panel     Component Value Date/Time   CHOL 132 07/06/2021 1101   CHOL 120 10/01/2018 0925   TRIG 135 07/06/2021 1101   HDL 34 (L) 07/06/2021 1101   HDL  31 (L) 10/01/2018 0925   CHOLHDL 3.9 07/06/2021 1101   VLDL 27 07/06/2021 1101   LDLCALC 71 07/06/2021 1101   LDLCALC 60 10/01/2018 0925     Wt Readings from Last 3 Encounters:  07/25/21 201 lb (91.2 kg)  07/06/21 203 lb 12.8 oz (92.4 kg)  04/11/21 204 lb 12.8 oz (92.9 kg)      Other studies Reviewed: Additional studies/ records that were reviewed today include: TEE 07/19/16  Review of the above records today demonstrates:  - Left ventricle: The cavity size was severely dilated. Systolic   function was severely reduced. The estimated ejection fraction   was in the range of 20% to 25%. Diffuse hypokinesis. - Aortic valve: No evidence of vegetation. - Mitral valve: A bioprosthesis was present. - Left atrium: The atrium was moderately dilated. No evidence of   thrombus in the atrial cavity or appendage. - Right ventricle: Systolic function was moderately reduced. - Right atrium: No evidence of thrombus in the atrial cavity or   appendage. - Atrial septum: No defect or patent foramen ovale was identified. - Tricuspid valve: No evidence of vegetation. There was   mild-moderate regurgitation. - Pulmonic valve: No evidence of vegetation.  Cath 06/20/17 1. Severe native three-vessel coronary artery disease with total occlusion of the LAD, total occlusion of the ramus intermedius, and total occlusion of the RCA 2. Continued patency of the LIMA to LAD with total occlusion of the apical LAD appearance unchanged from previous cath study 3. Patency of the most recent saphenous vein graft to PDA without significant stenosis present 4. Interval occlusion of the old saphenous vein graft PDA 5.  Nonvisualization of the saphenous vein graft to diagonal, suspect total occlusion from nonselective imaging 6. Low LVEDP  ASSESSMENT AND PLAN:  1.  Typical atrial flutter: Status post ablation without obvious recurrence.  CHA2DS2-VASc of 4.  Currently off anticoagulation.  2.  Complete heart block: Status post Medtronic CRT-D.  Device functioning appropriately.  No changes at this time.  3.  Chronic systolic heart failure: Currently on optimal medical therapy.  Status post Medtronic CRT-D.  Device functioning appropriately.  Continue with current management.  4.  Mitral regurgitation: Status post bioprosthetic mitral valve replacement.  Stable on most recent echo.  5.  Coronary artery disease: Status post repeat CABG.  Has stable angina.  No changes at this time.  6.  Nonsustained ventricular tachycardia: Found on device interrogation.  Currently on carvedilol 18.75 mg twice daily.  No changes.  Current medicines are reviewed at length with the patient today.   The patient does not have concerns regarding his medicines.  The following changes were made today: None  Labs/ tests ordered today include:  No orders of the defined types were placed in this encounter.    Disposition:   FU with Wilfrid Hyser 12 months  Signed, Yoseph Haile Meredith Leeds, MD  07/25/2021 3:50 PM     [address] [place] [address] [phone] (office) [phone] (fax)

## 2021-07-25 NOTE — Patient Instructions (Addendum)
Medication Instructions:  Your physician recommends that you continue on your current medications as directed. Please refer to the Current Medication list given to you today.  *If you need a refill on your cardiac medications before your next appointment, please call your pharmacy*   Lab Work: None ordered   Testing/Procedures: None ordered   Follow-Up: At [facility], you and your health needs are our priority.  As part of our continuing mission to provide you with exceptional heart care, we have created designated Provider Care Teams.  These Care Teams include your primary Cardiologist (physician) and Advanced Practice Providers (APPs -  Physician Assistants and Nurse Practitioners) who all work together to provide you with the care you need, when you need it.  Remote monitoring is used to monitor your Pacemaker or ICD from home. This monitoring reduces the number of office visits required to check your device to one time per year. It allows [place] to keep an eye on the functioning of your device to ensure it is working properly. You are scheduled for a device check from home on 07/26/2021. You may send your transmission at any time that day. If you have a wireless device, the transmission will be sent automatically. After your physician reviews your transmission, you will receive a postcard with your next transmission date.  Your next appointment:   1 year(s)  The format for your next appointment:   In Person  Provider:   Tommye Standard, PA or  Oda Kilts, [place]   Thank you for choosing [facility]!!   Trinidad Curet, RN [phone]

## 2021-07-26 ENCOUNTER — Ambulatory Visit (INDEPENDENT_AMBULATORY_CARE_PROVIDER_SITE_OTHER): Payer: Medicare Other

## 2021-07-26 DIAGNOSIS — I5022 Chronic systolic (congestive) heart failure: Secondary | ICD-10-CM

## 2021-07-26 LAB — CUP PACEART REMOTE DEVICE CHECK
Battery Remaining Longevity: 21 mo
Battery Voltage: 2.9 V
Brady Statistic AP VP Percent: 71.39 %
Brady Statistic AP VS Percent: 3.19 %
Brady Statistic AS VP Percent: 24.76 %
Brady Statistic AS VS Percent: 0.67 %
Brady Statistic RA Percent Paced: 72.71 %
Brady Statistic RV Percent Paced: 92.62 %
Date Time Interrogation Session: 20221025073400
HighPow Impedance: 63 Ohm
Implantable Lead Implant Date: 20171024
Implantable Lead Implant Date: 20171024
Implantable Lead Implant Date: 20171024
Implantable Lead Location: 753858
Implantable Lead Location: 753859
Implantable Lead Location: 753860
Implantable Lead Model: 4598
Implantable Lead Model: 5076
Implantable Pulse Generator Implant Date: 20171024
Lead Channel Impedance Value: 1007 Ohm
Lead Channel Impedance Value: 250.943
Lead Channel Impedance Value: 250.943
Lead Channel Impedance Value: 262.946
Lead Channel Impedance Value: 279.525
Lead Channel Impedance Value: 279.525
Lead Channel Impedance Value: 342 Ohm
Lead Channel Impedance Value: 399 Ohm
Lead Channel Impedance Value: 418 Ohm
Lead Channel Impedance Value: 475 Ohm
Lead Channel Impedance Value: 532 Ohm
Lead Channel Impedance Value: 532 Ohm
Lead Channel Impedance Value: 589 Ohm
Lead Channel Impedance Value: 646 Ohm
Lead Channel Impedance Value: 893 Ohm
Lead Channel Impedance Value: 893 Ohm
Lead Channel Impedance Value: 950 Ohm
Lead Channel Impedance Value: 988 Ohm
Lead Channel Pacing Threshold Amplitude: 0.5 V
Lead Channel Pacing Threshold Amplitude: 0.875 V
Lead Channel Pacing Threshold Amplitude: 1.25 V
Lead Channel Pacing Threshold Pulse Width: 0.4 ms
Lead Channel Pacing Threshold Pulse Width: 0.4 ms
Lead Channel Pacing Threshold Pulse Width: 1 ms
Lead Channel Sensing Intrinsic Amplitude: 0.625 mV
Lead Channel Sensing Intrinsic Amplitude: 0.625 mV
Lead Channel Sensing Intrinsic Amplitude: 9.625 mV
Lead Channel Sensing Intrinsic Amplitude: 9.625 mV
Lead Channel Setting Pacing Amplitude: 1.75 V
Lead Channel Setting Pacing Amplitude: 2 V
Lead Channel Setting Pacing Amplitude: 2 V
Lead Channel Setting Pacing Pulse Width: 0.4 ms
Lead Channel Setting Pacing Pulse Width: 1 ms
Lead Channel Setting Sensing Sensitivity: 0.3 mV

## 2021-07-26 LAB — TSH: TSH: 2.23 u[IU]/mL (ref 0.450–4.500)

## 2021-07-26 LAB — T4, FREE: Free T4: 1.52 ng/dL (ref 0.82–1.77)

## 2021-07-26 LAB — VITAMIN D 25 HYDROXY (VIT D DEFICIENCY, FRACTURES): Vit D, 25-Hydroxy: 42.1 ng/mL (ref 30.0–100.0)

## 2021-07-27 LAB — CUP PACEART REMOTE DEVICE CHECK
Battery Remaining Longevity: 21 mo
Battery Voltage: 2.9 V
Brady Statistic AP VP Percent: 74.99 %
Brady Statistic AP VS Percent: 5.16 %
Brady Statistic AS VP Percent: 19.23 %
Brady Statistic AS VS Percent: 0.63 %
Brady Statistic RA Percent Paced: 78.08 %
Brady Statistic RV Percent Paced: 90.81 %
Date Time Interrogation Session: 20221025232826
HighPow Impedance: 63 Ohm
Implantable Lead Implant Date: 20171024
Implantable Lead Implant Date: 20171024
Implantable Lead Implant Date: 20171024
Implantable Lead Location: 753858
Implantable Lead Location: 753859
Implantable Lead Location: 753860
Implantable Lead Model: 4598
Implantable Lead Model: 5076
Implantable Pulse Generator Implant Date: 20171024
Lead Channel Impedance Value: 250.943
Lead Channel Impedance Value: 255.093
Lead Channel Impedance Value: 255.093
Lead Channel Impedance Value: 270.667
Lead Channel Impedance Value: 275.5 Ohm
Lead Channel Impedance Value: 361 Ohm
Lead Channel Impedance Value: 399 Ohm
Lead Channel Impedance Value: 418 Ohm
Lead Channel Impedance Value: 475 Ohm
Lead Channel Impedance Value: 532 Ohm
Lead Channel Impedance Value: 551 Ohm
Lead Channel Impedance Value: 551 Ohm
Lead Channel Impedance Value: 646 Ohm
Lead Channel Impedance Value: 874 Ohm
Lead Channel Impedance Value: 893 Ohm
Lead Channel Impedance Value: 931 Ohm
Lead Channel Impedance Value: 988 Ohm
Lead Channel Impedance Value: 988 Ohm
Lead Channel Pacing Threshold Amplitude: 0.5 V
Lead Channel Pacing Threshold Amplitude: 0.875 V
Lead Channel Pacing Threshold Amplitude: 1.25 V
Lead Channel Pacing Threshold Pulse Width: 0.4 ms
Lead Channel Pacing Threshold Pulse Width: 0.4 ms
Lead Channel Pacing Threshold Pulse Width: 1 ms
Lead Channel Sensing Intrinsic Amplitude: 0.625 mV
Lead Channel Sensing Intrinsic Amplitude: 0.625 mV
Lead Channel Sensing Intrinsic Amplitude: 9.625 mV
Lead Channel Sensing Intrinsic Amplitude: 9.625 mV
Lead Channel Setting Pacing Amplitude: 1.75 V
Lead Channel Setting Pacing Amplitude: 2 V
Lead Channel Setting Pacing Amplitude: 2 V
Lead Channel Setting Pacing Pulse Width: 0.4 ms
Lead Channel Setting Pacing Pulse Width: 1 ms
Lead Channel Setting Sensing Sensitivity: 0.3 mV

## 2021-08-03 ENCOUNTER — Encounter: Payer: Self-pay | Admitting: "Endocrinology

## 2021-08-03 ENCOUNTER — Ambulatory Visit (INDEPENDENT_AMBULATORY_CARE_PROVIDER_SITE_OTHER): Payer: Medicare Other | Admitting: "Endocrinology

## 2021-08-03 ENCOUNTER — Other Ambulatory Visit: Payer: Self-pay

## 2021-08-03 VITALS — BP 100/54 | HR 64 | Ht 72.0 in | Wt 204.2 lb

## 2021-08-03 DIAGNOSIS — E782 Mixed hyperlipidemia: Secondary | ICD-10-CM

## 2021-08-03 DIAGNOSIS — E039 Hypothyroidism, unspecified: Secondary | ICD-10-CM

## 2021-08-03 DIAGNOSIS — I10 Essential (primary) hypertension: Secondary | ICD-10-CM | POA: Diagnosis not present

## 2021-08-03 DIAGNOSIS — E559 Vitamin D deficiency, unspecified: Secondary | ICD-10-CM

## 2021-08-03 DIAGNOSIS — E1122 Type 2 diabetes mellitus with diabetic chronic kidney disease: Secondary | ICD-10-CM | POA: Diagnosis not present

## 2021-08-03 DIAGNOSIS — N1831 Chronic kidney disease, stage 3a: Secondary | ICD-10-CM

## 2021-08-03 DIAGNOSIS — I255 Ischemic cardiomyopathy: Secondary | ICD-10-CM | POA: Diagnosis not present

## 2021-08-03 LAB — POCT GLYCOSYLATED HEMOGLOBIN (HGB A1C): HbA1c, POC (controlled diabetic range): 6.7 % (ref 0.0–7.0)

## 2021-08-03 MED ORDER — LEVOTHYROXINE SODIUM 50 MCG PO TABS: ORAL_TABLET | ORAL | 1 refills | Status: DC

## 2021-08-03 NOTE — Progress Notes (Signed)
02/09/2021           Endocrinology follow-up note   Subjective:    Patient ID: Christian Bowman, male    DOB: October 12, 1945,    Past Medical History:  Diagnosis Date   AICD (automatic cardioverter/defibrillator) present    medtronic-   DR. CROITORU , DR. Aundra Dubin    Anginal pain ([place])    cp sat 08/11/17   Anxiety    CAD (coronary artery disease)    CHF (congestive heart failure) ([place])    Complication of anesthesia    took awhile to wake up    Coronary artery disease involving coronary bypass graft    Cyst of neck    right side   DM2 (diabetes mellitus, type 2) ([place]) 08/26/2013   Dyspnea    Heart attack ([place])    "not sure when" (08/20/2017)   HTN (hypertension) 08/26/2013   Hyperlipidemia 08/26/2013   Hypothyroidism    Left main coronary artery disease    Left renal artery stenosis ([place])    Genesis 6x12 stent 2007   Obesity (BMI 30.0-34.9) 08/26/2013   Postoperative atrial fibrillation ([place]) 10/15/2005   Presence of permanent cardiac pacemaker    S/P CABG x 4 10/13/2005   LIMA to LAD, SVG to intermediate branch, sequential SVG to PDA and RPL branch, EVH via right thigh   S/P mitral valve replacement with bioprosthetic valve 07/11/2016   31 mm [facility] Mitral bovine bioprosthetic tissue valve   S/P redo CABG x 2 07/11/2016   SVG to PDA and SVG to Intermediate Branch, EVH via left thigh   Past Surgical History:  Procedure Laterality Date   A-FLUTTER ABLATION N/A 05/11/2017   Procedure: A-Flutter Ablation;  Surgeon: Constance Haw, MD;  Location: [place] [facility];  Service: Cardiovascular;  Laterality: N/A;   CARDIAC CATHETERIZATION N/A 06/21/2016   Procedure: Right/Left Heart Cath and Coronary/Graft Angiography;  Surgeon: Sherren Mocha, MD;  Location: [place] [facility];  Service: Cardiovascular;  Laterality: N/A;   CARDIAC VALVE REPLACEMENT     CARDIOVERSION N/A 07/19/2016   Procedure: CARDIOVERSION;  Surgeon: Lelon Perla, MD;  Location: [facility] ENDOSCOPY;   Service: Cardiovascular;  Laterality: N/A;   CARDIOVERSION N/A 09/08/2016   Procedure: CARDIOVERSION;  Surgeon: Fay Records, MD;  Location: [facility] ENDOSCOPY;  Service: Cardiovascular;  Laterality: N/A;   CORONARY ANGIOPLASTY     STENT 2016  [facility]   CORONARY ANGIOPLASTY WITH STENT PLACEMENT     DES in SVG to right coronary artery system   CORONARY ARTERY BYPASS GRAFT  10/13/2005   LIMA to LAD, SVG to intermediate branch, sequential SVG to PDA and RPL   CORONARY ARTERY BYPASS GRAFT N/A 07/11/2016   Procedure: REDO CORONARY ARTERY BYPASS GRAFTING (CABG) x two using left leg greater saphenous vein harvested endoscopically-SVG to PDA -SVG to RAMUS INTERMEDIATE;  Surgeon: Rexene Alberts, MD;  Location: [place];  Service: Open Heart Surgery;  Laterality: N/A;   CORONARY ARTERY BYPASS GRAFT N/A 07/11/2016   Procedure: Re-exploration (CABG) for post op bleeding,;  Surgeon: Rexene Alberts, MD;  Location: [place];  Service: Open Heart Surgery;  Laterality: N/A;   EP IMPLANTABLE DEVICE N/A 07/25/2016   Procedure: BiV ICD Insertion CRT-D;  Surgeon: Will Meredith Leeds, MD;  Location: [place] [facility];  Service: Cardiovascular;  Laterality: N/A;   INGUINAL HERNIA REPAIR Left 08/20/2017   INGUINAL HERNIA REPAIR Left 08/20/2017   Procedure: OPEN LEFT INGUINAL HERNIA REPAIR;  Surgeon: Alphonsa Overall,  MD;  Location: [place];  Service: General;  Laterality: Left;   LEFT HEART CATH AND CORS/GRAFTS ANGIOGRAPHY N/A 12/18/2016   Procedure: Left Heart Cath and Cors/Grafts Angiography;  Surgeon: Sherren Mocha, MD;  Location: [place] [facility] LAB;  Service: Cardiovascular;  Laterality: N/A;   LEFT HEART CATH AND CORS/GRAFTS ANGIOGRAPHY N/A 08/22/2017   Procedure: LEFT HEART CATH AND CORS/GRAFTS ANGIOGRAPHY;  Surgeon: Larey Dresser, MD;  Location: [place] [facility];  Service: Cardiovascular;  Laterality: N/A;   MITRAL VALVE REPLACEMENT N/A 07/11/2016   Procedure: MITRAL VALVE (MV) REPLACEMENT;  Surgeon: Rexene Alberts, MD;  Location: [place];  Service: Open Heart Surgery;  Laterality: N/A;   MYOCARDICAL PERFUSION  10/09/2007   NORMAL PERFUSION IN ALL REGIONS;NO EVIDENCE OF INDUCIBLE ISCHEMIA;POST STRESS EF% 66   RENAL ARTERY STENT Right 2007   RENAL DOPPLER  03/28/2010   RIGHT RA-NORMAL;LEFT PROXIMAL RA AT STENT-PATENT WITH NO EVIDENCE OF SIGN DIAMETER REDUCTION. R & L KIDNEYS: EQUAL IN SIZE,SYMMETRICAL IN SHAPE.   RIGHT HEART CATH N/A 01/08/2020   Procedure: RIGHT HEART CATH;  Surgeon: Larey Dresser, MD;  Location: [place] [facility];  Service: Cardiovascular;  Laterality: N/A;   TEE WITHOUT CARDIOVERSION N/A 06/15/2016   Procedure: TRANSESOPHAGEAL ECHOCARDIOGRAM (TEE);  Surgeon: Sanda Klein, MD;  Location: [facility] ENDOSCOPY;  Service: Cardiovascular;  Laterality: N/A;   TEE WITHOUT CARDIOVERSION N/A 07/11/2016   Procedure: TRANSESOPHAGEAL ECHOCARDIOGRAM (TEE);  Surgeon: Rexene Alberts, MD;  Location: [place];  Service: Open Heart Surgery;  Laterality: N/A;   TEE WITHOUT CARDIOVERSION N/A 07/19/2016   Procedure: TRANSESOPHAGEAL ECHOCARDIOGRAM (TEE);  Surgeon: Lelon Perla, MD;  Location: [facility] ENDOSCOPY;  Service: Cardiovascular;  Laterality: N/A;   TRANSESOPHAGEAL ECHOCARDIOGRAM  10/19/2005   NORMAL LV; MILD TO MODERATE AMOUNT OF SOFT ATHEROMATOUS PLAQUE OF THE THORACIC AORTA; THE LEFT ATRIUM IS MILDLY DILATED;LEFT ATRIAL APPENDAGE FUNCTION IS NORMAL;NO THROMBUS IDENTIFIED. SMALL PFO WITH RIGHT TO LEFT SHUNT   Social History   Socioeconomic History   Marital status: Married    Spouse name: Not on file   Number of children: 0   Years of education: 18   Highest education level: Not on file  Occupational History   Occupation: retired  Tobacco Use   Smoking status: Never Smoker   Smokeless tobacco: Never Used  Scientific laboratory technician Use: Never used  Substance and Sexual Activity   Alcohol use: No   Drug use: No   Sexual activity: Never  Other Topics Concern   Not on file  Social History Narrative    Right handed   Two story home   Drinks half and half coffee   Social Determinants of Health   Financial Resource Strain: Not on file  Food Insecurity: Not on file  Transportation Needs: Not on file  Physical Activity: Not on file  Stress: Not on file  Social Connections: Not on file   Outpatient Encounter Medications as of 02/09/2021  Medication Sig   amoxicillin (AMOXIL) 500 MG tablet Take 4 tablets (2,000 mg total) by mouth See admin instructions. Take 2000 mg 1 hour prior to dental work   aspirin EC 81 MG tablet Take 81 mg by mouth daily.    carvedilol (COREG) 12.5 MG tablet Take 1.5 tablets (18.75 mg total) by mouth 2 (two) times daily with a meal.   cholecalciferol (VITAMIN D3) 25 MCG (1000 UNIT) tablet Take 2,000 Units by mouth daily with breakfast.   clopidogrel (PLAVIX) 75 MG tablet Take  1 tablet by mouth once daily   digoxin (LANOXIN) 0.125 MG tablet Take 1/2 (one-half) tablet by mouth once daily   ENTRESTO 97-103 MG Take 1 tablet by mouth twice daily   eplerenone (INSPRA) 50 MG tablet Take 1 tablet by mouth once daily   furosemide (LASIX) 20 MG tablet Take 20 mg by mouth daily as needed for fluid or edema.   levothyroxine (SYNTHROID) 50 MCG tablet Take 1 pill daily before breakfast   linagliptin (TRADJENTA) 5 MG TABS tablet Take 1 tablet (5 mg total) by mouth daily.   Melatonin 1 MG CAPS Take 2 mg by mouth daily as needed (sleep).    neomycin-bacitracin-polymyxin (NEOSPORIN) ointment Apply 1 application topically daily as needed for wound care.   nitroGLYCERIN (NITROSTAT) 0.4 MG SL tablet DISSOLVE ONE TABLET UNDER THE TONGUE EVERY 5 MINUTES AS NEEDED FOR CHEST PAIN.  DO NOT EXCEED A TOTAL OF 3 DOSES IN 15 MINUTES   polyethylene glycol (MIRALAX / GLYCOLAX) packet Take 17 g by mouth daily as needed for mild constipation or moderate constipation.   rosuvastatin (CRESTOR) 10 MG tablet Take 1 tablet by mouth once daily   sodium zirconium cyclosilicate (LOKELMA) 10 g PACK packet  Take 10 g by mouth daily.   Tetrahydrozoline HCl (VISINE OP) Place 1 drop into both eyes daily as needed (irritation).   [DISCONTINUED] levothyroxine (SYNTHROID) 50 MCG tablet TAKE 1 TABLET BY MOUTH BEFORE BREAKFAST   No facility-administered encounter medications on file as of 02/09/2021.   ALLERGIES: Allergies  Allergen Reactions   Xanax [Alprazolam] Other (See Comments)    Pt feels very weak, tired and feels paralyzed     VACCINATION STATUS: Immunization History  Administered Date(s) Administered   Fluad Quad(high Dose 65+) 07/28/2019   Influenza,inj,Quad PF,6+ Mos 08/01/2016, 07/11/2017   Pneumococcal Polysaccharide-23 07/02/2015    Diabetes He presents for his follow-up diabetic visit. He has type 2 diabetes mellitus. Onset time: He was diagnosed at approximate age of 17 years. His disease course has been stable. There are no hypoglycemic associated symptoms. Pertinent negatives for hypoglycemia include no confusion, headaches, pallor or seizures. There are no diabetic associated symptoms. Pertinent negatives for diabetes include no chest pain, no fatigue, no polydipsia, no polyphagia, no polyuria and no weakness. There are no hypoglycemic complications. Symptoms are stable. Diabetic complications include heart disease. Risk factors for coronary artery disease include diabetes mellitus, dyslipidemia, hypertension, male sex and sedentary lifestyle. He is compliant with treatment most of the time. His weight is fluctuating minimally. He participates in exercise intermittently. There is no change in his home blood glucose trend. (His point-of-care A1c is 6.7%, generally improving.  He has no hypoglycemia.   ) An ACE inhibitor/angiotensin II receptor blocker is being taken.  Hyperlipidemia This is a chronic problem. The current episode started more than 1 year ago. Exacerbating diseases include diabetes. Pertinent negatives include no chest pain, myalgias or shortness of breath. Current  antihyperlipidemic treatment includes statins. Risk factors for coronary artery disease include diabetes mellitus, dyslipidemia, hypertension and male sex.  Hypertension This is a chronic problem. The current episode started more than 1 year ago. Pertinent negatives include no chest pain, headaches, neck pain, palpitations or shortness of breath. Risk factors for coronary artery disease include diabetes mellitus and dyslipidemia. Hypertensive end-organ damage includes CAD/MI. Identifiable causes of hypertension include a thyroid problem.  Thyroid Problem Presents for follow-up visit. Patient reports no constipation, diarrhea, fatigue or palpitations. (Remains on levothyroxine 75 mcg p.o. nightly.  He is  compliant.  He has no new complaints today.) The symptoms have been stable. His past medical history is significant for diabetes and hyperlipidemia.   Review of systems  Constitutional: + Steady body weight 200 pounds,   Body mass index is 27.59 kg/m. , no fatigue, no subjective hyperthermia, no subjective hypothermia    Objective:    BP (!) 136/58   Pulse 68   Ht 6' (1.829 m)   Wt 203 lb 6.4 oz (92.3 kg)   BMI 27.59 kg/m   Wt Readings from Last 3 Encounters:  02/09/21 203 lb 6.4 oz (92.3 kg)  12/08/20 201 lb 3.2 oz (91.3 kg)  09/17/20 199 lb 6.4 oz (90.4 kg)     Physical Exam- Limited  Constitutional:  Body mass index is 27.59 kg/m. , not in acute distress, normal state of mind Eyes:  EOMI, no exophthalmos   Chemistry (most recent): Lab Results  Component Value Date   NA 137 02/01/2021   K 4.5 02/01/2021   CL 100 02/01/2021   CO2 22 02/01/2021   BUN 29 (H) 02/01/2021   CREATININE 1.48 (H) 02/01/2021     Lipid Panel     Component Value Date/Time   CHOL 122 10/17/2019 1225   CHOL 120 10/01/2018 0925   TRIG 78 10/17/2019 1225   HDL 37 (L) 10/17/2019 1225   HDL 31 (L) 10/01/2018 0925   CHOLHDL 3.3 10/17/2019 1225   VLDL 16 10/17/2019 1225   LDLCALC 69 10/17/2019  1225   LDLCALC 60 10/01/2018 0925   LABVLDL 29 10/01/2018 0925    A1c today August 11, 2020 was 6.3%.  Assessment & Plan:   1. Type 2 diabetes mellitus with other circulatory complication ([place]), stage 3 CKD   His diabetes is  complicated by coronary artery disease status post coronary artery bypass graft and recent ACS and patient remains at a high risk for more acute and chronic complications of diabetes which include CAD, CVA, CKD, retinopathy, and neuropathy. These are all discussed in detail with the patient.  His point-of-care A1c is 6.7%, generally improving.  He has no hypoglycemia.      His most recent CMP still consistent with renal insufficiency- does have history of stage 2-3 renal insufficiency. - I have re-counseled the patient on diet management  by adopting a carbohydrate restricted / protein rich  Diet.  - Suggestion is made for him to avoid simple carbohydrates  from his diet including Cakes, Sweet Desserts, Ice Cream, Soda (diet and regular), Sweet Tea, Candies, Chips, Cookies, Store Bought Juices, Alcohol in Excess of  1-2 drinks a day, Artificial Sweeteners,  Coffee Creamer, and "Sugar-free" Products, Lemonade. This will help patient to have more stable blood glucose profile and potentially avoid unintended weight gain.   - Patient is advised to stick to a routine mealtimes to eat 3 meals  a day and avoid unnecessary snacks ( to snack only to correct hypoglycemia).   - I have approached patient with the following individualized plan to manage diabetes and patient agrees.  -His cardiologist believes that he would benefit from the SGLT2 inhibitors.  He could be considered for low-dose [place] or Jardiance in lieu of Tradjenta after he uses up his current supplies of Tradjenta 5 mg p.o. daily.      - Patient specific target  for A1c; LDL, HDL, Triglycerides, were discussed in detail.  2) BP/HTN -His blood pressure is controlled to target.  He is currently on  carvedilol 12.5 mg p.o. twice daily,  eplerenone 50 mg p.o. daily, as well as Entresto 97-103 mg p.o. daily.  If he is started on Jardiance or Farxiga, he will need less treatment for hypertension.  He could be considered for low-dose of Entresto.  3) Lipids/HPL: His most recent lipid panel showed controlled LDL at 69.  He is advised to continue Crestor 10 mg p.o. nightly.    Side effects and precautions discussed with him.    4)  Weight/Diet: His BMI is 27.59  -a candidate for some weight loss.  CDE consult in progress, exercise, and carbohydrates information provided.  5) hypothyroidism: - This is related to his therapy with amiodarone. His previsit thyroid function tests are consistent with appropriate replacement.   He is advised to continue levothyroxine 50 mcg p.o. daily before breakfast.   - We discussed about the correct intake of his thyroid hormone, on empty stomach at fasting, with water, separated by at least 30 minutes from breakfast and other medications,  and separated by more than 4 hours from calcium, iron, multivitamins, acid reflux medications (PPIs). -Patient is made aware of the fact that thyroid hormone replacement is needed for life, dose to be adjusted by periodic monitoring of thyroid function tests.   6) vitamin D deficiency: He has responded to vitamin D supplement with correction of his 25-hydroxy vitamin D to 40.  He is advised to continue vitamin D3 2000 units daily.   7) Chronic Care/Health Maintenance:  -Patient is  on ACEI/ARB and Statin medications and encouraged to continue to follow up with Ophthalmology, Podiatrist at least yearly or according to recommendations, and advised to  stay away from smoking. I have recommended yearly flu vaccine and pneumonia vaccination at least every 5 years, and  sleep for at least 7 hours a day.  I advised patient to maintain close follow up with his cardiologist and his  PCP for primary care needs.    I spent 21 minutes in  the care of the patient today including review of labs from Thyroid Function, CMP, and other relevant labs ; imaging/biopsy records (current and previous including abstractions from other facilities); face-to-face time discussing  his lab results and symptoms, medications doses, his options of short and long term treatment based on the latest standards of care / guidelines;   and documenting the encounter.  Christian Bowman  participated in the discussions, expressed understanding, and voiced agreement with the above plans.  All questions were answered to his satisfaction. he is encouraged to contact clinic should he have any questions or concerns prior to his return visit.    Follow up plan: Return in about 6 months (around 08/12/2021) for A1c -NV, F/U with Pre-visit Labs.  Glade Lloyd, MD Phone: [phone]  Fax: [phone]   This note was partially dictated with voice recognition software. Similar sounding words can be transcribed inadequately or may not  be corrected upon review.  02/09/2021, 9:25 AM

## 2021-08-03 NOTE — Progress Notes (Signed)
Remote ICD transmission.   

## 2021-08-23 ENCOUNTER — Encounter (HOSPITAL_COMMUNITY): Payer: Self-pay | Admitting: Cardiology

## 2021-08-23 ENCOUNTER — Other Ambulatory Visit (HOSPITAL_COMMUNITY): Payer: Self-pay

## 2021-08-23 ENCOUNTER — Other Ambulatory Visit (HOSPITAL_COMMUNITY): Payer: Self-pay | Admitting: Cardiology

## 2021-08-23 ENCOUNTER — Other Ambulatory Visit (HOSPITAL_COMMUNITY): Payer: Self-pay | Admitting: *Deleted

## 2021-08-23 MED ORDER — DAPAGLIFLOZIN PROPANEDIOL 5 MG PO TABS: 5.0000 mg | ORAL_TABLET | Freq: Every day | ORAL | 3 refills | Status: DC

## 2021-09-05 ENCOUNTER — Other Ambulatory Visit (HOSPITAL_COMMUNITY): Payer: Self-pay | Admitting: *Deleted

## 2021-09-05 MED ORDER — EPLERENONE 50 MG PO TABS: 50.0000 mg | ORAL_TABLET | Freq: Every day | ORAL | 11 refills | Status: DC

## 2021-09-29 ENCOUNTER — Encounter (HOSPITAL_COMMUNITY): Payer: Self-pay | Admitting: Cardiology

## 2021-10-03 ENCOUNTER — Encounter (HOSPITAL_COMMUNITY): Payer: Self-pay | Admitting: Cardiology

## 2021-10-04 ENCOUNTER — Other Ambulatory Visit (HOSPITAL_COMMUNITY): Payer: Self-pay | Admitting: *Deleted

## 2021-10-04 MED ORDER — EPLERENONE 25 MG PO TABS: 25.0000 mg | ORAL_TABLET | Freq: Every day | ORAL | 3 refills | Status: DC

## 2021-10-05 ENCOUNTER — Other Ambulatory Visit (HOSPITAL_COMMUNITY): Payer: Self-pay | Admitting: Cardiology

## 2021-10-06 ENCOUNTER — Encounter (HOSPITAL_COMMUNITY): Payer: Self-pay | Admitting: Cardiology

## 2021-10-06 ENCOUNTER — Other Ambulatory Visit: Payer: Self-pay | Admitting: Cardiology

## 2021-10-07 ENCOUNTER — Other Ambulatory Visit (HOSPITAL_COMMUNITY): Payer: Self-pay | Admitting: *Deleted

## 2021-10-07 LAB — BASIC METABOLIC PANEL
BUN/Creatinine Ratio: 17 (ref 10–24)
BUN: 26 mg/dL (ref 8–27)
CO2: 23 mmol/L (ref 20–29)
Calcium: 9 mg/dL (ref 8.6–10.2)
Chloride: 102 mmol/L (ref 96–106)
Creatinine, Ser: 1.49 mg/dL — ABNORMAL HIGH (ref 0.76–1.27)
Glucose: 142 mg/dL — ABNORMAL HIGH (ref 70–99)
Potassium: 4.5 mmol/L (ref 3.5–5.2)
Sodium: 140 mmol/L (ref 134–144)
eGFR: 49 mL/min/{1.73_m2} — ABNORMAL LOW (ref >59)

## 2021-10-07 LAB — SPECIMEN STATUS REPORT

## 2021-10-07 MED ORDER — AMOXICILLIN 500 MG PO TABS: 2000.0000 mg | ORAL_TABLET | ORAL | 0 refills | Status: DC

## 2021-10-10 ENCOUNTER — Other Ambulatory Visit: Payer: Self-pay

## 2021-10-10 ENCOUNTER — Encounter: Payer: Self-pay | Admitting: Cardiovascular Disease

## 2021-10-10 ENCOUNTER — Ambulatory Visit (INDEPENDENT_AMBULATORY_CARE_PROVIDER_SITE_OTHER): Payer: Medicare Other | Admitting: Cardiovascular Disease

## 2021-10-10 VITALS — BP 130/72 | HR 67 | Ht 72.0 in | Wt 202.8 lb

## 2021-10-10 DIAGNOSIS — Z8679 Personal history of other diseases of the circulatory system: Secondary | ICD-10-CM | POA: Diagnosis not present

## 2021-10-10 DIAGNOSIS — I5042 Chronic combined systolic (congestive) and diastolic (congestive) heart failure: Secondary | ICD-10-CM | POA: Diagnosis not present

## 2021-10-10 DIAGNOSIS — E785 Hyperlipidemia, unspecified: Secondary | ICD-10-CM

## 2021-10-10 DIAGNOSIS — I10 Essential (primary) hypertension: Secondary | ICD-10-CM

## 2021-10-10 DIAGNOSIS — Z953 Presence of xenogenic heart valve: Secondary | ICD-10-CM

## 2021-10-10 DIAGNOSIS — I25118 Atherosclerotic heart disease of native coronary artery with other forms of angina pectoris: Secondary | ICD-10-CM | POA: Diagnosis not present

## 2021-10-10 DIAGNOSIS — Z95810 Presence of automatic (implantable) cardiac defibrillator: Secondary | ICD-10-CM

## 2021-10-10 DIAGNOSIS — E1122 Type 2 diabetes mellitus with diabetic chronic kidney disease: Secondary | ICD-10-CM

## 2021-10-10 DIAGNOSIS — Z9889 Other specified postprocedural states: Secondary | ICD-10-CM

## 2021-10-10 DIAGNOSIS — N1832 Chronic kidney disease, stage 3b: Secondary | ICD-10-CM

## 2021-10-10 DIAGNOSIS — I493 Ventricular premature depolarization: Secondary | ICD-10-CM

## 2021-10-10 NOTE — Progress Notes (Signed)
Cardiology Office Note    Date:  10/13/2021   ID:  Christian Bowman, DOB 05/29/1946, MRN [id number]  PCP:  Christian Sites, MD  Cardiologist:  Christian Champagne, MD; Christian Lai, MD; Christian Klein, MD   Chief Complaint  Patient presents with   Congestive Heart Failure     History of Present Illness:  Christian Bowman is a 76 y.o. male with extensive and severe cardiac problems.  He has severe cardiomyopathy with LVEF of 20-25% (most recent echo March 2021) due to both ischemic cardiomyopathy and valvular heart disease.  He initially had a CABG in 2007, redo CABG in October 2017 when he also received a mitral valve bioprosthesis (Edwards magna 31 mm, implanted 2017).  He has a dual-chamber biventricular defibrillator (Medtronic) also implanted in 2017.  He has a history of atrial flutter with successful ablation and has been off anticoagulation. In January 2021 he had transient right hand weakness and dysarthria and received thrombolytics at the emergency room in [place].  His device did not show atrial arrhythmia and echocardiogram with contrast did not show LV thrombus.  He was discharged on clopidogrel.  Overall, Christian Bowman is doing quite well.  He has rare problems with chest pain.  Interestingly this usually happens when he is going to see a doctor, often occurring at the same point in the road when he is approaching their office.  This resolves promptly with a single nitroglycerin tablet.  He does not have chest pain during physical exercise at the gym.  He continues to have occasional episodes of artifactual bradycardia (asymptomatic heart rate in the 30s) which we have shown to be secondary to ventricular bigeminy.  His pacemaker is programmed with a lower rate limit of 60 bpm.  He has not had dizziness or syncope.  He denies orthopnea, PND or lower extremity edema. He denies palpitations.  He has not had any strokelike symptoms.  He started treatment with low-dose dapagliflozin 5 mg daily and had labs  performed on January 5 which were okay.  His usual blood pressure is low around 100/50 and today's blood pressure of 130/72 is unusually high for him.     He is on maximum tolerated doses of Entresto and carvedilol as well as eplerenone (gynecomastia on spironolactone) and furosemide.  He is taking an aspirin and clopidogrel and is on a highly effective statin.  Glycemic control is good with a hemoglobin A1c that was recently 6.7 %.  His most recent LDL was 71.  Defibrillator interrogation shows normal device function.  He has 69% atrial pacing and 92% ventricular pacing (effective 89.2%), diminished due to frequent PVCs.  These are noted on intracardiac electrogram today, often in a pattern of trigeminy.  Presenting rhythm was in alternation of atrial sensed-ventricular paced and atrial paced-ventricular paced beats with frequent PVCs.  He has not had any episode of nonsustained VT (last meaningful episode of ventricular arrhythmia was in late 2021)or atrial fibrillation.  Lead parameters are all good.  Estimated generator longevity is 19 months.  Presenting rhythm is atrial paced, biventricular paced, with relatively frequent atrial sensed, biventricular paced beats as well as PVCs. ef episode of nonsustained ventricular tachycardia, asymptomatic.  His right submandibular mass continues to slowly increase in size.  It does not really bother him except for cosmetic reasons, but it did become an issue when a barostim device was considered (that was later abandoned).    He underwent redo coronary artery bypass 2 (SVG to PDA, SVG to ramus),  as well as mitral valve replacement (bioprosthetic Edwards magna 31 mm) on 74/05/1447, complicated by AV block and atrial flutter requiring cardioversion. Estimated EF 20-25 % on his 07/18/16 TEE. A biventricular defibrillator (Medtronic Claria MRI CRT-D) was implanted on July 25, 2016. On December 18, 2016 he was hospitalized for small non-STEMI and was found to have  interval occlusion of the old SVG to PDA, while the more recently placed SVG was still patent. LVEDP was quite low at only 3 mmHg during that catheterization. He had atrial flutter ablation in August 2018 and is off anticoagulation. He had a NSTEMI in November 2018 after inguinal hernia repair surgery.  He understands the need for endocarditis prophylaxis with dental procedures or other medical procedures that might cause bacteremia and the indication for anticoagulation chronically.  He is still following up with Dr. Aundra Bowman and Dr. Curt Bowman .  His endocrinologist is Dr. Dorris Bowman.  He  Past Medical History:  Diagnosis Date   AICD (automatic cardioverter/defibrillator) present    medtronic-   DR. Merita Bowman , DR. Aundra Bowman    Anginal pain ([place])    cp sat 08/11/17   Anxiety    CAD (coronary artery disease)    CHF (congestive heart failure) (Christian Bowman)    Complication of anesthesia    took awhile to wake up    Coronary artery disease involving coronary bypass graft    Cyst of neck    right side   DM2 (diabetes mellitus, type 2) ([place]) 08/26/2013   Dyspnea    Heart attack ([place])    "not sure when" (08/20/2017)   HTN (hypertension) 08/26/2013   Hyperlipidemia 08/26/2013   Hypothyroidism    Left main coronary artery disease    Left renal artery stenosis ([place])    Genesis 6x12 stent 2007   Obesity (BMI 30.0-34.9) 08/26/2013   Postoperative atrial fibrillation ([place]) 10/15/2005   Presence of permanent cardiac pacemaker    S/P CABG x 4 10/13/2005   LIMA to LAD, SVG to intermediate branch, sequential SVG to PDA and RPL branch, EVH via right thigh   S/P mitral valve replacement with bioprosthetic valve 07/11/2016   31 mm [facility] Mitral bovine bioprosthetic tissue valve   S/P redo CABG x 2 07/11/2016   SVG to PDA and SVG to Intermediate Branch, EVH via left thigh    Past Surgical History:  Procedure Laterality Date   A-FLUTTER ABLATION N/A 05/11/2017   Procedure: A-Flutter Ablation;  Surgeon: Christian Haw, MD;  Location: [place] [facility];  Service: Cardiovascular;  Laterality: N/A;   CARDIAC CATHETERIZATION N/A 06/21/2016   Procedure: Right/Left Heart Cath and Coronary/Graft Angiography;  Surgeon: Sherren Mocha, MD;  Location: [place] [facility];  Service: Cardiovascular;  Laterality: N/A;   CARDIAC VALVE REPLACEMENT     CARDIOVERSION N/A 07/19/2016   Procedure: CARDIOVERSION;  Surgeon: Lelon Perla, MD;  Location: [facility] ENDOSCOPY;  Service: Cardiovascular;  Laterality: N/A;   CARDIOVERSION N/A 09/08/2016   Procedure: CARDIOVERSION;  Surgeon: Fay Records, MD;  Location: [facility] ENDOSCOPY;  Service: Cardiovascular;  Laterality: N/A;   CORONARY ANGIOPLASTY     STENT 2016  [facility]   CORONARY ANGIOPLASTY WITH STENT PLACEMENT     DES in SVG to right coronary artery system   CORONARY ARTERY BYPASS GRAFT  10/13/2005   LIMA to LAD, SVG to intermediate branch, sequential SVG to PDA and RPL   CORONARY ARTERY BYPASS GRAFT N/A 07/11/2016   Procedure: REDO CORONARY ARTERY BYPASS GRAFTING (CABG) x two  using left leg greater saphenous vein harvested endoscopically-SVG to PDA -SVG to RAMUS INTERMEDIATE;  Surgeon: Rexene Alberts, MD;  Location: [place];  Service: Open Heart Surgery;  Laterality: N/A;   CORONARY ARTERY BYPASS GRAFT N/A 07/11/2016   Procedure: Re-exploration (CABG) for post op bleeding,;  Surgeon: Rexene Alberts, MD;  Location: [place];  Service: Open Heart Surgery;  Laterality: N/A;   EP IMPLANTABLE DEVICE N/A 07/25/2016   Procedure: BiV ICD Insertion CRT-D;  Surgeon: Will Meredith Leeds, MD;  Location: [place] [facility];  Service: Cardiovascular;  Laterality: N/A;   INGUINAL HERNIA REPAIR Left 08/20/2017   INGUINAL HERNIA REPAIR Left 08/20/2017   Procedure: OPEN LEFT INGUINAL HERNIA REPAIR;  Surgeon: Alphonsa Overall, MD;  Location: [place];  Service: General;  Laterality: Left;   LEFT HEART CATH AND CORS/GRAFTS ANGIOGRAPHY N/A 12/18/2016   Procedure: Left Heart Cath and  Cors/Grafts Angiography;  Surgeon: Sherren Mocha, MD;  Location: [place] [facility];  Service: Cardiovascular;  Laterality: N/A;   LEFT HEART CATH AND CORS/GRAFTS ANGIOGRAPHY N/A 08/22/2017   Procedure: LEFT HEART CATH AND CORS/GRAFTS ANGIOGRAPHY;  Surgeon: Larey Dresser, MD;  Location: [place] [facility];  Service: Cardiovascular;  Laterality: N/A;   MITRAL VALVE REPLACEMENT N/A 07/11/2016   Procedure: MITRAL VALVE (MV) REPLACEMENT;  Surgeon: Rexene Alberts, MD;  Location: [place];  Service: Open Heart Surgery;  Laterality: N/A;   MYOCARDICAL PERFUSION  10/09/2007   NORMAL PERFUSION IN ALL REGIONS;NO EVIDENCE OF INDUCIBLE ISCHEMIA;POST STRESS EF% 66   RENAL ARTERY STENT Right 2007   RENAL DOPPLER  03/28/2010   RIGHT RA-NORMAL;LEFT PROXIMAL RA AT STENT-PATENT WITH NO EVIDENCE OF SIGN DIAMETER REDUCTION. R & L KIDNEYS: EQUAL IN SIZE,SYMMETRICAL IN SHAPE.   RIGHT HEART CATH N/A 01/08/2020   Procedure: RIGHT HEART CATH;  Surgeon: Larey Dresser, MD;  Location: [place] [facility];  Service: Cardiovascular;  Laterality: N/A;   TEE WITHOUT CARDIOVERSION N/A 06/15/2016   Procedure: TRANSESOPHAGEAL ECHOCARDIOGRAM (TEE);  Surgeon: Christian Klein, MD;  Location: [facility] ENDOSCOPY;  Service: Cardiovascular;  Laterality: N/A;   TEE WITHOUT CARDIOVERSION N/A 07/11/2016   Procedure: TRANSESOPHAGEAL ECHOCARDIOGRAM (TEE);  Surgeon: Rexene Alberts, MD;  Location: [place];  Service: Open Heart Surgery;  Laterality: N/A;   TEE WITHOUT CARDIOVERSION N/A 07/19/2016   Procedure: TRANSESOPHAGEAL ECHOCARDIOGRAM (TEE);  Surgeon: Lelon Perla, MD;  Location: [facility] ENDOSCOPY;  Service: Cardiovascular;  Laterality: N/A;   TRANSESOPHAGEAL ECHOCARDIOGRAM  10/19/2005   NORMAL LV; MILD TO MODERATE AMOUNT OF SOFT ATHEROMATOUS PLAQUE OF THE THORACIC AORTA; THE LEFT ATRIUM IS MILDLY DILATED;LEFT ATRIAL APPENDAGE FUNCTION IS NORMAL;NO THROMBUS IDENTIFIED. SMALL PFO WITH RIGHT TO LEFT SHUNT    Current Medications: Outpatient Medications  Prior to Visit  Medication Sig Dispense Refill   aspirin EC 81 MG tablet Take 81 mg by mouth daily.      carvedilol (COREG) 12.5 MG tablet Take 1.5 tablets (18.75 mg total) by mouth 2 (two) times daily with a meal. 90 tablet 11   cholecalciferol (VITAMIN D3) 25 MCG (1000 UNIT) tablet Take 2,000 Units by mouth daily with breakfast.     clopidogrel (PLAVIX) 75 MG tablet Take 1 tablet by mouth once daily 90 tablet 3   dapagliflozin propanediol (FARXIGA) 5 MG TABS tablet Take 1 tablet (5 mg total) by mouth daily before breakfast. 30 tablet 3   digoxin (LANOXIN) 0.125 MG tablet Take 1/2 (one-half) tablet by mouth once daily 45 tablet 4   ENTRESTO 97-103 MG  Take 1 tablet by mouth twice daily 180 tablet 3   eplerenone (INSPRA) 25 MG tablet Take 1 tablet (25 mg total) by mouth daily. 30 tablet 3   levothyroxine (SYNTHROID) 50 MCG tablet Take 1 pill daily before breakfast 90 tablet 1   LOKELMA 10 g PACK packet MIX 1 PACKET IN WATER AND DRINK BY MOUTH ONCE DAILY 90 each 0   neomycin-bacitracin-polymyxin (NEOSPORIN) ointment Apply 1 application topically daily as needed for wound care.     nitroGLYCERIN (NITROSTAT) 0.4 MG SL tablet DISSOLVE ONE TABLET UNDER THE TONGUE EVERY 5 MINUTES AS NEEDED FOR CHEST PAIN.  DO NOT EXCEED A TOTAL OF 3 DOSES IN 15 MINUTES 25 tablet 2   rosuvastatin (CRESTOR) 10 MG tablet Take 1 tablet by mouth once daily 90 tablet 3   Tetrahydrozoline HCl (VISINE OP) Place 1 drop into both eyes daily as needed (irritation).     amoxicillin (AMOXIL) 500 MG tablet Take 4 tablets (2,000 mg total) by mouth See admin instructions. Take 2000 mg 1 hour prior to dental work (Patient not taking: Reported on 10/10/2021) 4 tablet 0   furosemide (LASIX) 20 MG tablet Take 1 tablet (20 mg total) by mouth daily as needed for fluid or edema. (Patient not taking: Reported on 10/10/2021) 45 tablet 3   Melatonin 1 MG CAPS Take 2 mg by mouth daily as needed (sleep). (Patient not taking: Reported on 10/10/2021)      polyethylene glycol (MIRALAX / GLYCOLAX) packet Take 17 g by mouth daily as needed for mild constipation or moderate constipation. (Patient not taking: Reported on 10/10/2021)     TRADJENTA 5 MG TABS tablet Take 1 tablet by mouth once daily 90 tablet 0   No facility-administered medications prior to visit.     Allergies:   Xanax [alprazolam]   Social History   Socioeconomic History   Marital status: Married    Spouse name: Not on file   Number of children: 0   Years of education: 18   Highest education level: Not on file  Occupational History   Occupation: retired  Tobacco Use   Smoking status: Never   Smokeless tobacco: Never  Vaping Use   Vaping Use: Never used  Substance and Sexual Activity   Alcohol use: No   Drug use: No   Sexual activity: Never  Other Topics Concern   Not on file  Social History Narrative   Right handed   Two story home   Drinks half and half coffee   Social Determinants of Health   Financial Resource Strain: Not on file  Food Insecurity: Not on file  Transportation Needs: Not on file  Physical Activity: Not on file  Stress: Not on file  Social Connections: Not on file     Family History:  The patient's family history includes Heart attack in his father and mother; Heart disease in his father, maternal grandmother, and mother; Hypertension in his father and sister.   ROS:   Please see the history of present illness.    ROS All other systems are reviewed and are negative.   PHYSICAL EXAM:   VS:  BP 130/72 (BP Location: Left Arm, Patient Position: Sitting, Cuff Size: Large)    Pulse 67    Ht 6' (1.829 m)    Wt 202 lb 12.8 oz (92 kg)    SpO2 98%    BMI 27.50 kg/m      General: Alert, oriented x3, no distress, overweight Head: no evidence of trauma,  PERRL, EOMI, no exophtalmos or lid lag, no myxedema, no xanthelasma; normal ears, nose and oropharynx.  Soft cystic mass underneath the right mandibular angle. Neck: normal jugular venous  pulsations and no hepatojugular reflux; brisk carotid pulses without delay and no carotid bruits Chest: clear to auscultation, no signs of consolidation by percussion or palpation, normal fremitus, symmetrical and full respiratory excursions Cardiovascular: normal position and quality of the apical impulse, regular rhythm, normal first and second heart sounds, no murmurs, rubs or gallops.  Healthy defibrillator site left subclavian area Abdomen: no tenderness or distention, no masses by palpation, no abnormal pulsatility or arterial bruits, normal bowel sounds, no hepatosplenomegaly Extremities: no clubbing, cyanosis or edema; 2+ radial, ulnar and brachial pulses bilaterally; 2+ right femoral, posterior tibial and dorsalis pedis pulses; 2+ left femoral, posterior tibial and dorsalis pedis pulses; no subclavian or femoral bruits Neurological: grossly nonfocal Psych: Normal mood and affect    Wt Readings from Last 3 Encounters:  10/10/21 202 lb 12.8 oz (92 kg)  08/03/21 204 lb 3.2 oz (92.6 kg)  07/25/21 201 lb (91.2 kg)      Studies/Labs Reviewed:   ECHO 12/01/2019  1. No LV thrombus identified.   2. Left ventricular ejection fraction, by estimation, is 20 to 25%. The  left ventricle has severely decreased function. The left ventricle  demonstrates global hypokinesis. Left ventricular diastolic function could  not be evaluated.   3. Right ventricular systolic function is normal. The right ventricular  size is normal. There is normal pulmonary artery systolic pressure.   4. 07/11/16 32mm Edwards Magna Mitral Valve Replacement      . The mitral valve has been repaired/replaced. No evidence of mitral  valve regurgitation. No evidence of mitral stenosis.   5. The aortic valve is normal in structure and function. Aortic valve  regurgitation is not visualized. No aortic stenosis is present.   6. The inferior vena cava is dilated in size with >50% respiratory  variability, suggesting right  atrial pressure of 8 mmHg.     EKG:  EKG is ordered today and personally reviewed.  It shows a bigeminal pattern of atrial sensed alternating with atrial paced beats, all of them with biventricular pacing.  The QRS shows a distinct positive R wave in lead V1 consistent with successful LV capture, but the QRS is quite broad at 204 ms.  QTc 496 ms.  Recent Labs: 02/01/2021: ALT 17 07/25/2021: TSH 2.230 10/06/2021: BUN 26; Creatinine, Ser 1.49; Potassium 4.5; Sodium 140   Lipid Panel    Component Value Date/Time   CHOL 132 07/06/2021 1101   CHOL 120 10/01/2018 0925   TRIG 135 07/06/2021 1101   HDL 34 (L) 07/06/2021 1101   HDL 31 (L) 10/01/2018 0925   CHOLHDL 3.9 07/06/2021 1101   VLDL 27 07/06/2021 1101   LDLCALC 71 07/06/2021 1101   LDLCALC 60 10/01/2018 0925    ASSESSMENT:    1. Chronic combined systolic and diastolic heart failure ([place])   2. Coronary artery disease of native artery of native heart with stable angina pectoris ([place])   3. History of atrial flutter   4. Status post mitral valve replacement with bioprosthetic valve   5. Presence of biventricular implantable cardioverter-defibrillator (ICD)   6. PVCs (premature ventricular contractions)   7. Essential hypertension   8. Dyslipidemia (high LDL; low HDL)   9. Type 2 diabetes mellitus with stage 3b chronic kidney disease, without long-term current use of insulin ([place])   10. Stage 3b chronic  kidney disease ([place])   11. History of stent insertion of renal artery       PLAN:  In order of problems listed above:  CHF: Continues to have NYHA functional class II status and rarely requires loop diuretics (has not taken furosemide in months).  Exercises regularly at the gym.  He is on maximum tolerated doses of carvedilol, Entresto and eplerenone and has recently started on dapagliflozin.  Also on digoxin.  At one point had some issues with hyperkalemia, but most recent potassium was normal at 4.5. CAD: On the current medical  regimen, denies angina at rest or with activity. AFlutter: No recurrence since his ablation procedure.  No longer on antiarrhythmics or anticoagulants. S/P MVR: Normal bioprosthetic valve function by echo in March 2021.  Repeat echo later this year.  Reminded him of endocarditis prophylaxis. CRT-D: Normal device function, but roughly 10% of beats are not adequately resynchronize due to very frequent PVCs. PVCs: So far we have decided not to provide specific treatment for the PVCs, other than the beta-blocker.  He did not tolerate higher doses of carvedilol due to hypotension.  At this point, the benefit of amiodarone seems to be outweighed by its potential risks.  As long as his heart failure is well compensated, will avoid amiodarone. Hx HTN: Rare episodes of dizziness.  Blood pressure usually runs around 100s/50s. HLP: LDL cholesterol in target range, although HDL remains low. DM: Very well controlled.  [place] has been stopped and he has started low-dose [place]. CKD 3b: Stable renal function.  In fact his most recent creatinine was a little better at 1.38 (previously hovered around 1.5).  Watch for hyperkalemia since he is receiving eplerenone and Entresto and rarely requires loop diuretics. History of left renal artery stenosis status post stent 2007.  Stable renal function and well-controlled hypertension. Submandibular mass: Reluctant to undergo general anesthesia which would be necessary for resection of this mass.  Also aware of the risk for cranial nerve palsy.  Precluded implantation of a Barostim device.    Medication Adjustments/Labs and Tests Ordered: Current medicines are reviewed at length with the patient today.  Concerns regarding medicines are outlined above.  Medication changes, Labs and Tests ordered today are listed in the Patient Instructions below. Patient Instructions  Medication Instructions:  No changes *If you need a refill on your cardiac medications before your next  appointment, please call your pharmacy*   Lab Work: None ordered If you have labs (blood work) drawn today and your tests are completely normal, you will receive your results only by: [place] (if you have MyChart) OR A paper copy in the mail If you have any lab test that is abnormal or we need to change your treatment, we will call you to review the results.   Testing/Procedures: None ordered   Follow-Up: At [facility], you and your health needs are our priority.  As part of our continuing mission to provide you with exceptional heart care, we have created designated Provider Care Teams.  These Care Teams include your primary Cardiologist (physician) and Advanced Practice Providers (APPs -  Physician Assistants and Nurse Practitioners) who all work together to provide you with the care you need, when you need it.  We recommend signing up for the patient portal called "MyChart".  Sign up information is provided on this After Visit Summary.  MyChart is used to connect with patients for Virtual Visits (Telemedicine).  Patients are able to view lab/test results, encounter notes, upcoming appointments,  etc.  Non-urgent messages can be sent to your provider as well.   To learn more about what you can do with MyChart, go to [email or web address].    Your next appointment:   12 month(s)  The format for your next appointment:   In Person  Provider:   Sanda Klein, MD    Signed, Christian Klein, MD  10/13/2021 3:00 PM    [address], [place], [place]  [address] Phone: [phone]; Fax: [phone]

## 2021-10-10 NOTE — Patient Instructions (Signed)

## 2021-10-13 ENCOUNTER — Encounter: Payer: Self-pay | Admitting: Cardiovascular Disease

## 2021-10-13 MED ORDER — AMOXICILLIN 500 MG PO TABS: 2000.0000 mg | ORAL_TABLET | ORAL | 0 refills | Status: DC

## 2021-10-13 MED ORDER — FUROSEMIDE 20 MG PO TABS: 20.0000 mg | ORAL_TABLET | Freq: Every day | ORAL | 3 refills | Status: DC | PRN

## 2021-10-17 NOTE — Addendum Note (Signed)
Addended by: Orma Render on: 10/17/2021 12:27 PM   Modules accepted: Orders

## 2021-10-18 ENCOUNTER — Other Ambulatory Visit (HOSPITAL_COMMUNITY): Payer: Self-pay | Admitting: Cardiology

## 2021-10-25 ENCOUNTER — Ambulatory Visit (INDEPENDENT_AMBULATORY_CARE_PROVIDER_SITE_OTHER): Payer: Medicare Other

## 2021-10-25 DIAGNOSIS — I483 Typical atrial flutter: Secondary | ICD-10-CM

## 2021-10-25 LAB — CUP PACEART REMOTE DEVICE CHECK
Battery Remaining Longevity: 18 mo
Battery Voltage: 2.91 V
Brady Statistic AP VP Percent: 69.13 %
Brady Statistic AP VS Percent: 1.3 %
Brady Statistic AS VP Percent: 28.52 %
Brady Statistic AS VS Percent: 1.05 %
Brady Statistic RA Percent Paced: 68.13 %
Brady Statistic RV Percent Paced: 91.28 %
Date Time Interrogation Session: 20230124031703
HighPow Impedance: 58 Ohm
Implantable Lead Implant Date: 20171024
Implantable Lead Implant Date: 20171024
Implantable Lead Implant Date: 20171024
Implantable Lead Location: 753858
Implantable Lead Location: 753859
Implantable Lead Location: 753860
Implantable Lead Model: 4598
Implantable Lead Model: 5076
Implantable Pulse Generator Implant Date: 20171024
Lead Channel Impedance Value: 1007 Ohm
Lead Channel Impedance Value: 1064 Ohm
Lead Channel Impedance Value: 1064 Ohm
Lead Channel Impedance Value: 265.661
Lead Channel Impedance Value: 265.661
Lead Channel Impedance Value: 278.237
Lead Channel Impedance Value: 289.049
Lead Channel Impedance Value: 289.049
Lead Channel Impedance Value: 304 Ohm
Lead Channel Impedance Value: 399 Ohm
Lead Channel Impedance Value: 399 Ohm
Lead Channel Impedance Value: 513 Ohm
Lead Channel Impedance Value: 551 Ohm
Lead Channel Impedance Value: 551 Ohm
Lead Channel Impedance Value: 608 Ohm
Lead Channel Impedance Value: 665 Ohm
Lead Channel Impedance Value: 950 Ohm
Lead Channel Impedance Value: 950 Ohm
Lead Channel Pacing Threshold Amplitude: 0.5 V
Lead Channel Pacing Threshold Amplitude: 1 V
Lead Channel Pacing Threshold Amplitude: 1.125 V
Lead Channel Pacing Threshold Pulse Width: 0.4 ms
Lead Channel Pacing Threshold Pulse Width: 0.4 ms
Lead Channel Pacing Threshold Pulse Width: 1 ms
Lead Channel Sensing Intrinsic Amplitude: 0.875 mV
Lead Channel Sensing Intrinsic Amplitude: 0.875 mV
Lead Channel Sensing Intrinsic Amplitude: 11.375 mV
Lead Channel Sensing Intrinsic Amplitude: 11.375 mV
Lead Channel Setting Pacing Amplitude: 1.75 V
Lead Channel Setting Pacing Amplitude: 2 V
Lead Channel Setting Pacing Amplitude: 2 V
Lead Channel Setting Pacing Pulse Width: 0.4 ms
Lead Channel Setting Pacing Pulse Width: 1 ms
Lead Channel Setting Sensing Sensitivity: 0.3 mV

## 2021-11-04 NOTE — Progress Notes (Signed)
Remote ICD transmission.   

## 2021-11-12 ENCOUNTER — Other Ambulatory Visit (HOSPITAL_COMMUNITY): Payer: Self-pay | Admitting: Cardiology

## 2021-11-26 ENCOUNTER — Other Ambulatory Visit: Payer: Self-pay | Admitting: Cardiology

## 2021-12-24 ENCOUNTER — Other Ambulatory Visit (HOSPITAL_COMMUNITY): Payer: Self-pay | Admitting: Cardiology

## 2022-01-08 ENCOUNTER — Other Ambulatory Visit (HOSPITAL_COMMUNITY): Payer: Self-pay | Admitting: Cardiology

## 2022-01-24 ENCOUNTER — Ambulatory Visit (INDEPENDENT_AMBULATORY_CARE_PROVIDER_SITE_OTHER): Payer: Medicare Other

## 2022-01-24 DIAGNOSIS — I255 Ischemic cardiomyopathy: Secondary | ICD-10-CM

## 2022-01-24 DIAGNOSIS — I5042 Chronic combined systolic (congestive) and diastolic (congestive) heart failure: Secondary | ICD-10-CM

## 2022-01-25 LAB — CUP PACEART REMOTE DEVICE CHECK
Battery Remaining Longevity: 17 mo
Battery Voltage: 2.9 V
Brady Statistic AP VP Percent: 70.12 %
Brady Statistic AP VS Percent: 0.88 %
Brady Statistic AS VP Percent: 27.4 %
Brady Statistic AS VS Percent: 1.59 %
Brady Statistic RA Percent Paced: 68.37 %
Brady Statistic RV Percent Paced: 91.05 %
Date Time Interrogation Session: 20230425112608
HighPow Impedance: 67 Ohm
Implantable Lead Implant Date: 20171024
Implantable Lead Implant Date: 20171024
Implantable Lead Implant Date: 20171024
Implantable Lead Location: 753858
Implantable Lead Location: 753859
Implantable Lead Location: 753860
Implantable Lead Model: 4598
Implantable Lead Model: 5076
Implantable Pulse Generator Implant Date: 20171024
Lead Channel Impedance Value: 246.635
Lead Channel Impedance Value: 255.093
Lead Channel Impedance Value: 255.093
Lead Channel Impedance Value: 265.661
Lead Channel Impedance Value: 265.661
Lead Channel Impedance Value: 304 Ohm
Lead Channel Impedance Value: 399 Ohm
Lead Channel Impedance Value: 418 Ohm
Lead Channel Impedance Value: 475 Ohm
Lead Channel Impedance Value: 513 Ohm
Lead Channel Impedance Value: 551 Ohm
Lead Channel Impedance Value: 551 Ohm
Lead Channel Impedance Value: 722 Ohm
Lead Channel Impedance Value: 874 Ohm
Lead Channel Impedance Value: 893 Ohm
Lead Channel Impedance Value: 931 Ohm
Lead Channel Impedance Value: 931 Ohm
Lead Channel Impedance Value: 931 Ohm
Lead Channel Pacing Threshold Amplitude: 0.5 V
Lead Channel Pacing Threshold Amplitude: 1 V
Lead Channel Pacing Threshold Amplitude: 1.125 V
Lead Channel Pacing Threshold Pulse Width: 0.4 ms
Lead Channel Pacing Threshold Pulse Width: 0.4 ms
Lead Channel Pacing Threshold Pulse Width: 1 ms
Lead Channel Sensing Intrinsic Amplitude: 0.875 mV
Lead Channel Sensing Intrinsic Amplitude: 0.875 mV
Lead Channel Sensing Intrinsic Amplitude: 13 mV
Lead Channel Sensing Intrinsic Amplitude: 13 mV
Lead Channel Setting Pacing Amplitude: 1.75 V
Lead Channel Setting Pacing Amplitude: 2 V
Lead Channel Setting Pacing Amplitude: 2 V
Lead Channel Setting Pacing Pulse Width: 0.4 ms
Lead Channel Setting Pacing Pulse Width: 1 ms
Lead Channel Setting Sensing Sensitivity: 0.3 mV

## 2022-01-26 ENCOUNTER — Encounter: Payer: Self-pay | Admitting: Cardiology

## 2022-01-26 ENCOUNTER — Encounter (HOSPITAL_COMMUNITY): Payer: Self-pay | Admitting: Cardiology

## 2022-01-26 ENCOUNTER — Ambulatory Visit (HOSPITAL_COMMUNITY)
Admission: RE | Admit: 2022-01-26 | Discharge: 2022-01-26 | Disposition: A | Discharge status: Home / Self Care | Payer: Medicare Other | Source: Ambulatory Visit | Attending: Cardiology | Admitting: Cardiology

## 2022-01-26 VITALS — BP 128/80 | HR 68 | Wt 201.4 lb

## 2022-01-26 DIAGNOSIS — I25118 Atherosclerotic heart disease of native coronary artery with other forms of angina pectoris: Secondary | ICD-10-CM

## 2022-01-26 DIAGNOSIS — Z8673 Personal history of transient ischemic attack (TIA), and cerebral infarction without residual deficits: Secondary | ICD-10-CM | POA: Insufficient documentation

## 2022-01-26 DIAGNOSIS — I255 Ischemic cardiomyopathy: Secondary | ICD-10-CM | POA: Insufficient documentation

## 2022-01-26 DIAGNOSIS — I4892 Unspecified atrial flutter: Secondary | ICD-10-CM | POA: Insufficient documentation

## 2022-01-26 DIAGNOSIS — I5022 Chronic systolic (congestive) heart failure: Secondary | ICD-10-CM | POA: Insufficient documentation

## 2022-01-26 DIAGNOSIS — I340 Nonrheumatic mitral (valve) insufficiency: Secondary | ICD-10-CM | POA: Insufficient documentation

## 2022-01-26 DIAGNOSIS — Z8774 Personal history of (corrected) congenital malformations of heart and circulatory system: Secondary | ICD-10-CM | POA: Insufficient documentation

## 2022-01-26 DIAGNOSIS — E1122 Type 2 diabetes mellitus with diabetic chronic kidney disease: Secondary | ICD-10-CM | POA: Insufficient documentation

## 2022-01-26 DIAGNOSIS — I25810 Atherosclerosis of coronary artery bypass graft(s) without angina pectoris: Secondary | ICD-10-CM | POA: Insufficient documentation

## 2022-01-26 DIAGNOSIS — N183 Chronic kidney disease, stage 3 unspecified: Secondary | ICD-10-CM | POA: Insufficient documentation

## 2022-01-26 DIAGNOSIS — Z7902 Long term (current) use of antithrombotics/antiplatelets: Secondary | ICD-10-CM | POA: Diagnosis not present

## 2022-01-26 DIAGNOSIS — I5042 Chronic combined systolic (congestive) and diastolic (congestive) heart failure: Secondary | ICD-10-CM

## 2022-01-26 DIAGNOSIS — I442 Atrioventricular block, complete: Secondary | ICD-10-CM | POA: Insufficient documentation

## 2022-01-26 DIAGNOSIS — Z7982 Long term (current) use of aspirin: Secondary | ICD-10-CM | POA: Diagnosis not present

## 2022-01-26 DIAGNOSIS — I252 Old myocardial infarction: Secondary | ICD-10-CM | POA: Diagnosis not present

## 2022-01-26 DIAGNOSIS — I493 Ventricular premature depolarization: Secondary | ICD-10-CM

## 2022-01-26 DIAGNOSIS — Z79899 Other long term (current) drug therapy: Secondary | ICD-10-CM | POA: Insufficient documentation

## 2022-01-26 DIAGNOSIS — Z953 Presence of xenogenic heart valve: Secondary | ICD-10-CM | POA: Diagnosis not present

## 2022-01-26 DIAGNOSIS — D170 Benign lipomatous neoplasm of skin and subcutaneous tissue of head, face and neck: Secondary | ICD-10-CM | POA: Diagnosis not present

## 2022-01-26 LAB — DIGOXIN LEVEL: Digoxin Level: 0.6 ng/mL — ABNORMAL LOW (ref 0.8–2.0)

## 2022-01-26 LAB — TSH: TSH: 2.927 u[IU]/mL (ref 0.350–4.500)

## 2022-01-26 LAB — T4, FREE: Free T4: 1.31 ng/dL — ABNORMAL HIGH (ref 0.61–1.12)

## 2022-01-26 LAB — BASIC METABOLIC PANEL
Anion gap: 6 (ref 5–15)
BUN: 19 mg/dL (ref 8–23)
CO2: 26 mmol/L (ref 22–32)
Calcium: 9 mg/dL (ref 8.9–10.3)
Chloride: 105 mmol/L (ref 98–111)
Creatinine, Ser: 1.42 mg/dL — ABNORMAL HIGH (ref 0.61–1.24)
GFR, Estimated: 52 mL/min — ABNORMAL LOW (ref >60)
Glucose, Bld: 214 mg/dL — ABNORMAL HIGH (ref 70–99)
Potassium: 4.5 mmol/L (ref 3.5–5.1)
Sodium: 137 mmol/L (ref 135–145)

## 2022-01-26 MED ORDER — DAPAGLIFLOZIN PROPANEDIOL 10 MG PO TABS: ORAL_TABLET | ORAL | 3 refills | Status: DC

## 2022-01-26 MED ORDER — CARVEDILOL 12.5 MG PO TABS: ORAL_TABLET | ORAL | 3 refills | Status: DC

## 2022-01-26 MED ORDER — NITROGLYCERIN 0.4 MG SL SUBL: SUBLINGUAL_TABLET | SUBLINGUAL | 2 refills | Status: DC

## 2022-01-26 MED ORDER — DAPAGLIFLOZIN PROPANEDIOL 5 MG PO TABS: ORAL_TABLET | ORAL | 3 refills | Status: DC

## 2022-01-26 MED ORDER — EPLERENONE 25 MG PO TABS: 25.0000 mg | ORAL_TABLET | Freq: Every day | ORAL | 3 refills | Status: DC

## 2022-01-26 MED ORDER — ROSUVASTATIN CALCIUM 10 MG PO TABS: 10.0000 mg | ORAL_TABLET | Freq: Every day | ORAL | 3 refills | Status: DC

## 2022-01-26 MED ORDER — ENTRESTO 97-103 MG PO TABS: 1.0000 | ORAL_TABLET | Freq: Two times a day (BID) | ORAL | 3 refills | Status: DC

## 2022-01-26 NOTE — Patient Instructions (Signed)
Medication Changes: ? ?Increase Farxgia to '10mg'$  daily ? ?Lab Work: ? ?Labs done today, your results will be available in MyChart, we will contact you for abnormal readings. ? ? ?Testing/Procedures: ? ?Repeat blood work in 10 days ? ?Your physician has requested that you have an echocardiogram. Echocardiography is a painless test that uses sound waves to create images of your heart. It provides your doctor with information about the size and shape of your heart and how well your heart?s chambers and valves are working. This procedure takes approximately one hour. There are no restrictions for this procedure. ? ? ?Referrals: ? ?none ? ?Special Instructions // Education: ? ?none ? ?Follow-Up in: 4 months with echocardiogram  ? ?At the [facility], you and your health needs are our priority. We have a designated team specialized in the treatment of Heart Failure. This Care Team includes your primary Heart Failure Specialized Cardiologist (physician), Advanced Practice Providers (APPs- Physician Assistants and Nurse Practitioners), and Pharmacist who all work together to provide you with the care you need, when you need it.  ? ?You may see any of the following providers on your designated Care Team at your next follow up: ? ?Dr Glori Bickers ?Dr Loralie Champagne ?Darrick Grinder, NP ?Lyda Jester, PA ?Jessica Milford,NP ?Marlyce Huge, PA ?Audry Riles, PharmD ? ? ?Please be sure to bring in all your medications bottles to every appointment.  ? ?Need to Contact us: ? ?If you have any questions or concerns before your next appointment please send [place] a message through [place] or call our office at [phone].   ? ?TO LEAVE A MESSAGE FOR THE NURSE SELECT OPTION 2, PLEASE LEAVE A MESSAGE INCLUDING: ?YOUR NAME ?DATE OF BIRTH ?CALL BACK NUMBER ?REASON FOR CALL**this is important as we prioritize the call backs ? ?YOU WILL RECEIVE A CALL BACK THE SAME DAY AS LONG AS YOU CALL BEFORE 4:00 PM ? ? ?

## 2022-01-26 NOTE — Progress Notes (Addendum)
?  ? ? ?Date:  01/26/2022  ? ?ID:  Christian Bowman, DOB [birth year], MRN [id number]   ?Provider location: [facility] ?Type of Visit: Established patient  ? ?PCP:  Sharilyn Sites, MD  ?Cardiologist: Dr. Sallyanne Kuster  ?HF Cardiologist:  Loralie Champagne, MD ?  ?History of Present Illness: ?Christian Bowman is a 76 y.o. male with history of CAD s/p CABG in 2007 then redo CABG in 10/17 with mitral valve replacement.  He was admitted in 10/17 for redo CABG with SVG-PDA and SVG-ramus.  He also had mitral valve replacement with a bioprosthetic valve because of infarct-related mitral regurgitation.  Post-operative course was complicated by CHF requiring diuresis.  He also had atrial flutter and required DCCV.  Due to complete heart block, he later got a CRT-D system.   ?  ?At a prior visit, he was in atrial flutter.  He saw Dr. Curt Bears, it was decided to arrange for DCCV with ablation down the road when PPM leads have been in longer.  He had successful DCCV in 12/17 and is in NSR today.  ?  ?He was admitted in 3/18 with NSTEMI and chest pain. TnI only 0.5.  LHC showed occlusion of SVG-PDA from CABG#1 to be the likely culprit.  However, SVG-PDA from CABG#2 was patent.  No intervention.  Echo in 3/18 from [name] showed EF 30-35%, stable bioprosthetic mitral valve.  ?  ?He had atrial flutter ablation in 8/18.  He is in NSR today and is off amiodarone.   ?  ?In 11/18, he had an inguinal hernia repair. Post-op, he had an NSTEMI.  LHC showed occlusion of a PLV branch that had been backfilled by SVG-PDA (prior cath had shown severe diffuse disease in the PLV).  Echo in 11/18 showed EF 20-25% with normal bioprosthetic mitral valve.   ?  ?With no recurrence of atrial arrhythmias, he is now off anticoagulation.  ? ?Echo in 7/20 showed EF 25-30%, bioprosthetic MV with no MR and mean gradient 5 mmHg, normal RV.   ? ?In 1/21, he developed right hand weakness and dysarthria concerning for CVA.  He went to the ER in [place] and  had tPA.  Symptoms resolved.  Atrial fibrillation was not noted per his wife's report.  CTA head/neck showed no significant carotid stenosis.  He was started on Plavix 75 mg daily and sent home.   ? ?CPX (3/21) showed severe functional impairment due to HF.  Echo showed EF 20-25%, normal RV, normally functioning bioprosthetic mitral valve.  RHC in 4/21 showed low filling pressures and low cardiac index (2.1 Fick, 1.9 thermo).  He saw Dr. Orvan Seen to discuss possible lateral thoracotomy access LVAD. He decided against LVAD placement.  ?  ?He returns for followup of CHF.   He is going to maintenance cardiac rehab in [place] twice a week.  He has occasional chest pain when he gets "upset."  This is fairly rare, he has taken NTG a couple of times since last visit, no change to pattern. Weight is down 2 lbs.  Generally, no dyspnea walking on flat ground.  Dyspnea with more heavy exertion.  He has been able to wash his car, do yardwork.  He was started on low dose Farxiga 5 mg daily and at the same time, his eplerenone was cut back to 25 mg daily.  ? ?Medtronic device interrogation: No AF, stable thoracic impedance, 89% effective CRT (suspect low due to PVCs) ?  ?Labs (11/17): K 4.8, creatinine  1.89, hgb 12.3, digoxin 0.9, LFTs normal, TSH 8.235 (mild increase), free T3 low, free T4 normal, LFTs normal.  ?Labs (12/17): K 4.5, creatinine 1.7, BNP 3909 ?Labs (3/18): K 3.9, creatinine 1.48, hgb 11.3 ?Labs (4/18): digoxin 0.5, LFTs normal ?Labs (7/18): K 5.1, creatinine 1.78, LDL 63, HDL 29, TSH elevated ?Labs (11/18): K 4.5, creatinine 1.86, hgb 13.6 ?Labs (12/18): K 5, creatinine 1.88 ?Labs (1/19): digoxin 0.3, K 4.7, creatinine 1.83 ?Labs (6/19): LDL 70, HDL 32, K 5.1, creatinine 1.88 ?Labs (12/19): LDL 60, HDL 31 ?Labs (1/20): K 5.1, creatinine 1.64 ?Labs (4/20): K 4.5, creatinine 1.77 ?Labs (8/20): K 4.6, creatinine 1.5 ?Labs (1/21): K 4.5, creatinine 1.58, LDL 69 ?Labs (2/21): K 4.5, creatinine 1.66 ?Labs (4/21): K  4.1, creatinine 1.63, digoxin 0.4 ?Labs (5/21): K 4.4, creatinine 1.51 ?Labs (6/21): K 4.4, creatinine 1.51, digoxin 0.3 ?Labs (12/21): K 4.9, creatinine 1.69 ?Labs (5/22): K 4.5, creatinine 1.48 ?Labs (10/22): LDL 71 ?Labs (1/23): K 4.5, creatinine 1.49 ?  ?PMH: ?1. CAD: CABG 2007.  ?- LHC (9/17) with patent LIMA-LAD, totally occluded SVG-D, severe stenosis in SVG-PDA.  ?- Redo CABG 10/17 with SVG-PDA, SVG-ramus and mitral valve replacement.  ?- NSTEMI 3/18.  LHC with culprit lesion likely occlusion of SVG-PDA from CABG#1.  Patent SVG-PDA from CABG#2.  No intervention.  ?- NSTEMI 11/18 post-op inguinal hernia repair.  LHC with occlusion of a PLV branch that had been backfilled by SVG-PDA (prior cath had shown severe diffuse disease in the PLV).  ?2. Mitral regurgitation: Ischemic MR, mitral valve replacement was done in 10/17 (bioprosthetic). ?3. Complete heart block: Post-op in 10/17.  Medtronic CRT-D placed.  ?4. Atrial flutter: DCCV 10/17 and again in 12/17.  ?- Ablation 8/18, now off amiodarone.  ?5. Type II diabetes ?6. Hyperlipidemia ?7. Chronic systolic CHF: Ischemic cardiomyopathy.   ?- TEE (10/17): EF 20-25%, severe LV dilation ?- Echo (3/18, [name]): EF 30-35%, bioprosthetic mitral valve with mean gradient 5 mmHg.  ?- Echo (11/18): EF 20-25%, moderate LV dilation, normal bioprosthetic mitral valve.  ?- Echo (7/20): EF 25-30%, moderate LV dilation, inferior/inferolateral AK, bioprosthetic MV with mean gradient 5 and no MR, normal RV.  ?- Painful gynecomastia with spironolactone.  ?- Echo (1/21): EF < 20%, normal RV size and systolic function, bioprosthetic mitral valve with mild MR, mean gradient 2 mmHg.  ?- CPX (3/21): peak VO2 11.2, VE/VCO2 slope 56, RER 1.21.  ?- Echo (3/21): EF 20-25%, no LV thrombus, RV normal, bioprosthetic mitral valve appear normal.  ?- RHC (4/21): mean RA 3, PA 25/7, mean PCWP 11, CI 2.1 (Fick), 1.9 (thermo).  ?8. Hypothyroidism ?9. CVA 1/21: CTA head/neck with no carotid  stenosis.  ?10. PVCs ? ?Current Outpatient Medications  ?Medication Sig Dispense Refill  ? amoxicillin (AMOXIL) 500 MG tablet Take 4 tablets (2,000 mg total) by mouth See admin instructions. Take 2000 mg 1 hour prior to dental work 4 tablet 0  ? aspirin EC 81 MG tablet Take 81 mg by mouth daily.     ? cholecalciferol (VITAMIN D3) 25 MCG (1000 UNIT) tablet Take 2,000 Units by mouth daily with breakfast.    ? clopidogrel (PLAVIX) 75 MG tablet Take 1 tablet by mouth once daily 90 tablet 0  ? digoxin (LANOXIN) 0.125 MG tablet Take 1/2 (one-half) tablet by mouth once daily 45 tablet 4  ? levothyroxine (SYNTHROID) 50 MCG tablet Take 1 pill daily before breakfast 90 tablet 1  ? LOKELMA 10 g PACK packet DISSOLVE 1 PACKET IN WATER &  DRINK ONCE DAILY 90 each 0  ? neomycin-bacitracin-polymyxin (NEOSPORIN) ointment Apply 1 application topically daily as needed for wound care.    ? Tetrahydrozoline HCl (VISINE OP) Place 1 drop into both eyes daily as needed (irritation).    ? carvedilol (COREG) 12.5 MG tablet TAKE 1 & 1/2 (ONE & ONE-HALF) TABLETS BY MOUTH TWICE DAILY WITH A MEAL 180 tablet 3  ? dapagliflozin propanediol (FARXIGA) 10 MG TABS tablet TAKE 1 TABLET BY MOUTH ONCE DAILY BEFORE BREAKFAST **DICOUNTINE TRADJENTA** 90 tablet 3  ? eplerenone (INSPRA) 25 MG tablet Take 1 tablet (25 mg total) by mouth daily. 90 tablet 3  ? furosemide (LASIX) 20 MG tablet Take 1 tablet (20 mg total) by mouth daily as needed for fluid or edema. (Patient not taking: Reported on 01/26/2022) 45 tablet 3  ? nitroGLYCERIN (NITROSTAT) 0.4 MG SL tablet DISSOLVE ONE TABLET UNDER THE TONGUE EVERY 5 MINUTES AS NEEDED FOR CHEST PAIN.  DO NOT EXCEED A TOTAL OF 3 DOSES IN 15 MINUTES 25 tablet 2  ? rosuvastatin (CRESTOR) 10 MG tablet Take 1 tablet (10 mg total) by mouth daily. 90 tablet 3  ? sacubitril-valsartan (ENTRESTO) 97-103 MG Take 1 tablet by mouth 2 (two) times daily. 180 tablet 3  ? ?No current facility-administered medications for this encounter.   ? ? ?Allergies:   Xanax [alprazolam]  ? ?Social History:  The patient  reports that he has never smoked. He has never used smokeless tobacco. He reports that he does not drink alcohol and does not use drugs.  ?

## 2022-01-30 ENCOUNTER — Encounter (HOSPITAL_COMMUNITY): Payer: Self-pay | Admitting: Cardiology

## 2022-01-30 ENCOUNTER — Telehealth (HOSPITAL_COMMUNITY): Payer: Self-pay

## 2022-01-30 NOTE — Telephone Encounter (Addendum)
Pt aware, agreeable, and verbalized understanding ?Has appointment with endocrinologist scheduled  ? ? ?----- Message from Larey Dresser, MD sent at 01/26/2022  9:24 PM EDT ----- ?Thyroid labs to be addressed by his endocrinologist.  ?

## 2022-02-01 ENCOUNTER — Telehealth: Payer: Self-pay | Admitting: "Endocrinology

## 2022-02-01 ENCOUNTER — Ambulatory Visit (INDEPENDENT_AMBULATORY_CARE_PROVIDER_SITE_OTHER): Payer: Medicare Other | Admitting: "Endocrinology

## 2022-02-01 ENCOUNTER — Encounter: Payer: Self-pay | Admitting: "Endocrinology

## 2022-02-01 ENCOUNTER — Other Ambulatory Visit: Payer: Self-pay

## 2022-02-01 VITALS — BP 116/58 | HR 76 | Ht 72.0 in | Wt 199.4 lb

## 2022-02-01 DIAGNOSIS — E1122 Type 2 diabetes mellitus with diabetic chronic kidney disease: Secondary | ICD-10-CM | POA: Diagnosis not present

## 2022-02-01 DIAGNOSIS — N1831 Chronic kidney disease, stage 3a: Secondary | ICD-10-CM | POA: Diagnosis not present

## 2022-02-01 DIAGNOSIS — I10 Essential (primary) hypertension: Secondary | ICD-10-CM

## 2022-02-01 DIAGNOSIS — E782 Mixed hyperlipidemia: Secondary | ICD-10-CM

## 2022-02-01 DIAGNOSIS — I25118 Atherosclerotic heart disease of native coronary artery with other forms of angina pectoris: Secondary | ICD-10-CM | POA: Diagnosis not present

## 2022-02-01 DIAGNOSIS — E039 Hypothyroidism, unspecified: Secondary | ICD-10-CM

## 2022-02-01 LAB — POCT GLYCOSYLATED HEMOGLOBIN (HGB A1C): HbA1c, POC (controlled diabetic range): 7.6 % — AB (ref 0.0–7.0)

## 2022-02-01 MED ORDER — LEVOTHYROXINE SODIUM 50 MCG PO TABS: ORAL_TABLET | ORAL | 0 refills | Status: DC

## 2022-02-01 MED ORDER — ACCU-CHEK GUIDE ME W/DEVICE KIT: 1.0000 | PACK | 0 refills | Status: AC

## 2022-02-01 MED ORDER — ACCU-CHEK GUIDE VI STRP: ORAL_STRIP | 2 refills | Status: DC

## 2022-02-01 MED ORDER — LEVOTHYROXINE SODIUM 25 MCG PO TABS: 25.0000 ug | ORAL_TABLET | Freq: Every day | ORAL | 0 refills | Status: DC

## 2022-02-01 MED ORDER — ACCU-CHEK GUIDE ME W/DEVICE KIT: 1.0000 | PACK | 0 refills | Status: DC

## 2022-02-01 NOTE — Telephone Encounter (Signed)
It looks like Dr.Nida sent them to [facility] in [place] earlier today. ?

## 2022-02-01 NOTE — Progress Notes (Signed)
?02/01/2022 ? ?        Endocrinology follow-up note ? ? ?Subjective:  ? ? Patient ID: Christian Bowman, male    DOB: July 16, 1946,  ? ? ?Past Medical History:  ?Diagnosis Date  ? AICD (automatic cardioverter/defibrillator) present   ? medtronic-   DR. CROITORU , DR. Aundra Dubin   ? Anginal pain ([place])   ? cp sat 08/11/17  ? Anxiety   ? CAD (coronary artery disease)   ? CHF (congestive heart failure) ([place])   ? Complication of anesthesia   ? took awhile to wake up   ? Coronary artery disease involving coronary bypass graft   ? Cyst of neck   ? right side  ? DM2 (diabetes mellitus, type 2) ([place]) 08/26/2013  ? Dyspnea   ? Heart attack ([place])   ? "not sure when" (08/20/2017)  ? HTN (hypertension) 08/26/2013  ? Hyperlipidemia 08/26/2013  ? Hypothyroidism   ? Left main coronary artery disease   ? Left renal artery stenosis ([facility])   ? Genesis 6x12 stent 2007  ? Obesity (BMI 30.0-34.9) 08/26/2013  ? Postoperative atrial fibrillation ([place]) 10/15/2005  ? Presence of permanent cardiac pacemaker   ? S/P CABG x 4 10/13/2005  ? LIMA to LAD, SVG to intermediate branch, sequential SVG to PDA and RPL branch, EVH via right thigh  ? S/P mitral valve replacement with bioprosthetic valve 07/11/2016  ? 31 mm [facility] Mitral bovine bioprosthetic tissue valve  ? S/P redo CABG x 2 07/11/2016  ? SVG to PDA and SVG to Intermediate Branch, EVH via left thigh  ? ?Past Surgical History:  ?Procedure Laterality Date  ? A-FLUTTER ABLATION N/A 05/11/2017  ? Procedure: A-Flutter Ablation;  Surgeon: Constance Haw, MD;  Location: [facility];  Service: Cardiovascular;  Laterality: N/A;  ? CARDIAC CATHETERIZATION N/A 06/21/2016  ? Procedure: Right/Left Heart Cath and Coronary/Graft Angiography;  Surgeon: Sherren Mocha, MD;  Location: [facility];  Service: Cardiovascular;  Laterality: N/A;  ? CARDIAC VALVE REPLACEMENT    ? CARDIOVERSION N/A 07/19/2016  ? Procedure: CARDIOVERSION;  Surgeon: Lelon Perla, MD;  Location: [facility] ENDOSCOPY;  Service:  Cardiovascular;  Laterality: N/A;  ? CARDIOVERSION N/A 09/08/2016  ? Procedure: CARDIOVERSION;  Surgeon: Fay Records, MD;  Location: [facility] ENDOSCOPY;  Service: Cardiovascular;  Laterality: N/A;  ? CORONARY ANGIOPLASTY    ? STENT 2016  [place]  ? CORONARY ANGIOPLASTY WITH STENT PLACEMENT    ? DES in SVG to right coronary artery system  ? CORONARY ARTERY BYPASS GRAFT  10/13/2005  ? LIMA to LAD, SVG to intermediate branch, sequential SVG to PDA and RPL  ? CORONARY ARTERY BYPASS GRAFT N/A 07/11/2016  ? Procedure: REDO CORONARY ARTERY BYPASS GRAFTING (CABG) x two using left leg greater saphenous vein harvested endoscopically-SVG to PDA -SVG to RAMUS INTERMEDIATE;  Surgeon: Rexene Alberts, MD;  Location: [facility];  Service: Open Heart Surgery;  Laterality: N/A;  ? CORONARY ARTERY BYPASS GRAFT N/A 07/11/2016  ? Procedure: Re-exploration (CABG) for post op bleeding,;  Surgeon: Rexene Alberts, MD;  Location: [facility];  Service: Open Heart Surgery;  Laterality: N/A;  ? EP IMPLANTABLE DEVICE N/A 07/25/2016  ? Procedure: BiV ICD Insertion CRT-D;  Surgeon: Will Meredith Leeds, MD;  Location: [facility];  Service: Cardiovascular;  Laterality: N/A;  ? INGUINAL HERNIA REPAIR Left 08/20/2017  ? INGUINAL HERNIA REPAIR Left 08/20/2017  ? Procedure: OPEN LEFT INGUINAL HERNIA REPAIR;  Surgeon: Alphonsa Overall, MD;  Location: [facility] OR;  Service: General;  Laterality: Left;  ? LEFT HEART CATH AND CORS/GRAFTS ANGIOGRAPHY N/A 12/18/2016  ? Procedure: Left Heart Cath and Cors/Grafts Angiography;  Surgeon: Sherren Mocha, MD;  Location: [facility] [facility];  Service: Cardiovascular;  Laterality: N/A;  ? LEFT HEART CATH AND CORS/GRAFTS ANGIOGRAPHY N/A 08/22/2017  ? Procedure: LEFT HEART CATH AND CORS/GRAFTS ANGIOGRAPHY;  Surgeon: Larey Dresser, MD;  Location: [facility] [facility];  Service: Cardiovascular;  Laterality: N/A;  ? MITRAL VALVE REPLACEMENT N/A 07/11/2016  ? Procedure: MITRAL VALVE (MV) REPLACEMENT;  Surgeon: Rexene Alberts,  MD;  Location: [facility];  Service: Open Heart Surgery;  Laterality: N/A;  ? MYOCARDICAL PERFUSION  10/09/2007  ? NORMAL PERFUSION IN ALL REGIONS;NO EVIDENCE OF INDUCIBLE ISCHEMIA;POST STRESS EF% 66  ? RENAL ARTERY STENT Right 2007  ? RENAL DOPPLER  03/28/2010  ? RIGHT RA-NORMAL;LEFT PROXIMAL RA AT STENT-PATENT WITH NO EVIDENCE OF SIGN DIAMETER REDUCTION. R & L KIDNEYS: EQUAL IN SIZE,SYMMETRICAL IN SHAPE.  ? RIGHT HEART CATH N/A 01/08/2020  ? Procedure: RIGHT HEART CATH;  Surgeon: Larey Dresser, MD;  Location: [place];  Service: Cardiovascular;  Laterality: N/A;  ? TEE WITHOUT CARDIOVERSION N/A 06/15/2016  ? Procedure: TRANSESOPHAGEAL ECHOCARDIOGRAM (TEE);  Surgeon: Sanda Klein, MD;  Location: [facility];  Service: Cardiovascular;  Laterality: N/A;  ? TEE WITHOUT CARDIOVERSION N/A 07/11/2016  ? Procedure: TRANSESOPHAGEAL ECHOCARDIOGRAM (TEE);  Surgeon: Rexene Alberts, MD;  Location: [facility];  Service: Open Heart Surgery;  Laterality: N/A;  ? TEE WITHOUT CARDIOVERSION N/A 07/19/2016  ? Procedure: TRANSESOPHAGEAL ECHOCARDIOGRAM (TEE);  Surgeon: Lelon Perla, MD;  Location: [facility] ENDOSCOPY;  Service: Cardiovascular;  Laterality: N/A;  ? TRANSESOPHAGEAL ECHOCARDIOGRAM  10/19/2005  ? NORMAL LV; MILD TO MODERATE AMOUNT OF SOFT ATHEROMATOUS PLAQUE OF THE THORACIC AORTA; THE LEFT ATRIUM IS MILDLY DILATED;LEFT ATRIAL APPENDAGE FUNCTION IS NORMAL;NO THROMBUS IDENTIFIED. SMALL PFO WITH RIGHT TO LEFT SHUNT  ? ?Social History  ? ?Socioeconomic History  ? Marital status: Married  ?  Spouse name: Not on file  ? Number of children: 0  ? Years of education: 33  ? Highest education level: Not on file  ?Occupational History  ? Occupation: retired  ?Tobacco Use  ? Smoking status: Never  ? Smokeless tobacco: Never  ?Vaping Use  ? Vaping Use: Never used  ?Substance and Sexual Activity  ? Alcohol use: No  ? Drug use: No  ? Sexual activity: Never  ?Other Topics Concern  ? Not on file  ?Social History Narrative  ? Right handed  ?  Two story home  ? Drinks half and half coffee  ? ?Social Determinants of Health  ? ?Financial Resource Strain: Not on file  ?Food Insecurity: Not on file  ?Transportation Needs: Not on file  ?Physical Activity: Not on file  ?Stress: Not on file  ?Social Connections: Not on file  ? ?Outpatient Encounter Medications as of 02/01/2022  ?Medication Sig  ? glucose blood (ACCU-CHEK GUIDE) test strip Use  to monitor glucose once a day at fasting.  ? levothyroxine (SYNTHROID) 25 MCG tablet Take 1 tablet (25 mcg total) by mouth daily before breakfast. Take 1 pill of 25 mcg every other day alternating with 1 pill of 50 mcg every other day.  ? [DISCONTINUED] Blood Glucose Monitoring Suppl (ACCU-CHEK GUIDE ME) w/Device KIT 1 Piece by Does not apply route as directed.  ? amoxicillin (AMOXIL) 500 MG tablet Take 4 tablets (2,000 mg total) by mouth See admin  instructions. Take 2000 mg 1 hour prior to dental work  ? aspirin EC 81 MG tablet Take 81 mg by mouth daily.   ? carvedilol (COREG) 12.5 MG tablet TAKE 1 & 1/2 (ONE & ONE-HALF) TABLETS BY MOUTH TWICE DAILY WITH A MEAL  ? cholecalciferol (VITAMIN D3) 25 MCG (1000 UNIT) tablet Take 2,000 Units by mouth daily with breakfast.  ? clopidogrel (PLAVIX) 75 MG tablet Take 1 tablet by mouth once daily  ? dapagliflozin propanediol (FARXIGA) 10 MG TABS tablet TAKE 1 TABLET BY MOUTH ONCE DAILY BEFORE BREAKFAST **DICOUNTINE TRADJENTA**  ? digoxin (LANOXIN) 0.125 MG tablet Take 1/2 (one-half) tablet by mouth once daily  ? eplerenone (INSPRA) 25 MG tablet Take 1 tablet (25 mg total) by mouth daily.  ? furosemide (LASIX) 20 MG tablet Take 1 tablet (20 mg total) by mouth daily as needed for fluid or edema. (Patient not taking: Reported on 01/26/2022)  ? levothyroxine (SYNTHROID) 50 MCG tablet Take 1 pill of 50 mcg every other day alternating with 1 pill of 25 mcg every other day.  ? LOKELMA 10 g PACK packet DISSOLVE 1 PACKET IN WATER & DRINK ONCE DAILY  ? neomycin-bacitracin-polymyxin (NEOSPORIN)  ointment Apply 1 application topically daily as needed for wound care.  ? nitroGLYCERIN (NITROSTAT) 0.4 MG SL tablet DISSOLVE ONE TABLET UNDER THE TONGUE EVERY 5 MINUTES AS NEEDED FOR CHEST PAIN.  DO NOT E

## 2022-02-01 NOTE — Telephone Encounter (Signed)
Patient called back and said that the meter he uses is Accu Chek Guide. Can you send in strips to [facility] in [place] ?

## 2022-02-03 ENCOUNTER — Other Ambulatory Visit: Payer: Self-pay

## 2022-02-03 DIAGNOSIS — E1122 Type 2 diabetes mellitus with diabetic chronic kidney disease: Secondary | ICD-10-CM

## 2022-02-03 MED ORDER — ACCU-CHEK GUIDE VI STRP: ORAL_STRIP | 2 refills | Status: DC

## 2022-02-09 ENCOUNTER — Other Ambulatory Visit: Payer: Self-pay | Admitting: Cardiology

## 2022-02-09 NOTE — Progress Notes (Signed)
Remote ICD transmission.   

## 2022-02-10 ENCOUNTER — Other Ambulatory Visit: Payer: Self-pay | Admitting: Cardiology

## 2022-02-10 LAB — BASIC METABOLIC PANEL
BUN/Creatinine Ratio: 20 (ref 10–24)
BUN: 31 mg/dL — ABNORMAL HIGH (ref 8–27)
CO2: 23 mmol/L (ref 20–29)
Calcium: 9.1 mg/dL (ref 8.6–10.2)
Chloride: 103 mmol/L (ref 96–106)
Creatinine, Ser: 1.56 mg/dL — ABNORMAL HIGH (ref 0.76–1.27)
Glucose: 218 mg/dL — ABNORMAL HIGH (ref 70–99)
Potassium: 4.6 mmol/L (ref 3.5–5.2)
Sodium: 142 mmol/L (ref 134–144)
eGFR: 46 mL/min/{1.73_m2} — ABNORMAL LOW (ref >59)

## 2022-02-28 ENCOUNTER — Other Ambulatory Visit (HOSPITAL_COMMUNITY): Payer: Self-pay | Admitting: Cardiology

## 2022-04-08 ENCOUNTER — Other Ambulatory Visit (HOSPITAL_COMMUNITY): Payer: Self-pay | Admitting: Cardiology

## 2022-04-18 ENCOUNTER — Telehealth: Payer: Self-pay | Admitting: Cardiovascular Disease

## 2022-04-18 NOTE — Telephone Encounter (Signed)
Make sure he has a followup appt arranged.

## 2022-04-18 NOTE — Telephone Encounter (Signed)
Returned call to [name] at [facility] ED.  Patient came in overnight with SOB but is doing well and they will be discharging him.   She states O2 is good with ambulation, BNP elevated (unsure level), trop negative.  Patient is stable.     Request for records to be sent for review.  Routed to Dr. Aundra Dubin and Dr. Sallyanne Kuster to make aware.

## 2022-04-18 NOTE — Telephone Encounter (Signed)
[name] with [facility] ED calling to touch base to let doctor know that pt came in with shortness of breath, which was what the patient stated happened last time he had a heart attack, but there is no findings of that, and they believe he is okay. She says she just wanted to reach out to let someone know.

## 2022-04-19 ENCOUNTER — Other Ambulatory Visit: Payer: Self-pay | Admitting: Cardiovascular Disease

## 2022-04-20 ENCOUNTER — Encounter (HOSPITAL_COMMUNITY): Payer: Self-pay | Admitting: Cardiology

## 2022-04-21 MED ORDER — AMOXICILLIN 500 MG PO TABS: 2000.0000 mg | ORAL_TABLET | ORAL | 0 refills | Status: DC

## 2022-04-24 ENCOUNTER — Encounter: Payer: Self-pay | Admitting: Cardiovascular Disease

## 2022-04-24 ENCOUNTER — Other Ambulatory Visit: Payer: Self-pay | Admitting: "Endocrinology

## 2022-04-24 ENCOUNTER — Ambulatory Visit (INDEPENDENT_AMBULATORY_CARE_PROVIDER_SITE_OTHER): Payer: Medicare Other | Admitting: Cardiovascular Disease

## 2022-04-24 VITALS — BP 132/64 | HR 67 | Ht 72.0 in | Wt 198.4 lb

## 2022-04-24 DIAGNOSIS — Z953 Presence of xenogenic heart valve: Secondary | ICD-10-CM

## 2022-04-24 DIAGNOSIS — I255 Ischemic cardiomyopathy: Secondary | ICD-10-CM

## 2022-04-24 DIAGNOSIS — E785 Hyperlipidemia, unspecified: Secondary | ICD-10-CM

## 2022-04-24 DIAGNOSIS — E1122 Type 2 diabetes mellitus with diabetic chronic kidney disease: Secondary | ICD-10-CM

## 2022-04-24 DIAGNOSIS — Z9889 Other specified postprocedural states: Secondary | ICD-10-CM

## 2022-04-24 DIAGNOSIS — I493 Ventricular premature depolarization: Secondary | ICD-10-CM

## 2022-04-24 DIAGNOSIS — N1832 Chronic kidney disease, stage 3b: Secondary | ICD-10-CM

## 2022-04-24 DIAGNOSIS — Z95810 Presence of automatic (implantable) cardiac defibrillator: Secondary | ICD-10-CM

## 2022-04-24 DIAGNOSIS — Z8679 Personal history of other diseases of the circulatory system: Secondary | ICD-10-CM

## 2022-04-24 DIAGNOSIS — I25118 Atherosclerotic heart disease of native coronary artery with other forms of angina pectoris: Secondary | ICD-10-CM | POA: Diagnosis not present

## 2022-04-24 DIAGNOSIS — I5042 Chronic combined systolic (congestive) and diastolic (congestive) heart failure: Secondary | ICD-10-CM

## 2022-04-24 DIAGNOSIS — K118 Other diseases of salivary glands: Secondary | ICD-10-CM

## 2022-04-24 DIAGNOSIS — R918 Other nonspecific abnormal finding of lung field: Secondary | ICD-10-CM

## 2022-04-24 DIAGNOSIS — I10 Essential (primary) hypertension: Secondary | ICD-10-CM

## 2022-04-24 MED ORDER — FUROSEMIDE 20 MG PO TABS: 20.0000 mg | ORAL_TABLET | Freq: Every day | ORAL | 3 refills | Status: DC | PRN

## 2022-04-24 NOTE — Patient Instructions (Addendum)
Medication Instructions:  FUROSEMIDE: Take 20 mg once daily as needed for a weight over 190 lbs  *If you need a refill on your cardiac medications before your next appointment, please call your pharmacy*   Lab Work: You will need to have a BMET drawn in 3 months, prior to the [place]. You may have this done at any [facility]  If you have labs (blood work) drawn today and your tests are completely normal, you will receive your results only by: [facility] (if you have MyChart) OR A paper copy in the mail If you have any lab test that is abnormal or we need to change your treatment, we will call you to review the results.   Testing/Procedures: A chest ct has been ordered.  Imaging will call you to set this up. This should be done in 3 months   Follow-Up: At [facility], you and your health needs are our priority.  As part of our continuing mission to provide you with exceptional heart care, we have created designated Provider Care Teams.  These Care Teams include your primary Cardiologist (physician) and Advanced Practice Providers (APPs -  Physician Assistants and Nurse Practitioners) who all work together to provide you with the care you need, when you need it.  We recommend signing up for the patient portal called "MyChart".  Sign up information is provided on this After Visit Summary.  MyChart is used to connect with patients for Virtual Visits (Telemedicine).  Patients are able to view lab/test results, encounter notes, upcoming appointments, etc.  Non-urgent messages can be sent to your provider as well.   To learn more about what you can do with MyChart, go to [email or web address].    Your next appointment:   4 month(s) on a device day  The format for your next appointment:   In Person  Provider:   Dr. Sallyanne Kuster  Important Information About Sugar

## 2022-04-24 NOTE — Progress Notes (Signed)
Cardiology Office Note    Date:  04/26/2022   ID:  LESEAN Bowman, DOB 1945-10-14, MRN [id number]  PCP:  Sharilyn Sites, MD  Cardiologist:  Loralie Champagne, MD; Allegra Lai, MD; Sanda Klein, MD   Chief Complaint  Patient presents with   Congestive Heart Failure     History of Present Illness:  Christian Bowman is a 76 y.o. male with extensive and severe cardiac problems.  He has severe cardiomyopathy with LVEF of 20-25% (most recent echo March 2021) due to both ischemic cardiomyopathy and valvular heart disease.  He initially had a CABG in 2007, redo CABG in October 2017 when he also received a mitral valve bioprosthesis (Edwards magna 31 mm, implanted 2017).  He has a dual-chamber biventricular defibrillator (Medtronic) also implanted in 2017.  He has a history of atrial flutter with successful ablation and has been off anticoagulation. In January 2021 he had transient right hand weakness and dysarthria and received thrombolytics at the emergency room in [place].  His device did not show atrial arrhythmia and echocardiogram with contrast did not show LV thrombus.  He was discharged on clopidogrel.  For the first time in about 2 years Camrin required brief hospitalization for heart failure exacerbation in [place] on July 17.  He had clear-cut symptoms of orthopnea.  Work-up in the emergency room did not show any evidence of heart failure exacerbation by chest x-ray but his BNP was greater than 5000 (caveat he is on Entresto).  Serial troponin assays were normal and his ECG was nondiagnostic due to biventricular pacing.  Creatinine was 1.59 (his baseline).  Electrolytes were normal.  He underwent a CT angiogram of the chest for pulmonary embolism protocol, this made him throw up and he immediately felt that his breathing improved afterwards.  He did not receive diuretics.  He did have extra sodium intake previous week due to craving for a cheeseburger.  Incidentally, the CT scan performed in [place]  reportedly showed a 17 mm paramediastinal mass and a repeat CT scan was recommended in 3-6 months.  Interrogation of his device shows that his OptiVol was already beginning to show a trend towards volume overload even a week earlier.  It seems that he is lost some true weight and we need to reassess his dry weight.  At home today he weighed 193 pounds, 5 pounds less than our office scale.  Device interrogation otherwise shows generally favorable findings.  He has 71% atrial pacing.  He only has 86.9% effective biventricular pacing due to very frequent PVCs.  This is not much different from his chronic findings.  Estimated generator longevity is 15 months and all lead parameters are normal.  He is on maximum tolerated doses of Entresto and carvedilol as well as eplerenone (gynecomastia on spironolactone) and furosemide.  He is taking an aspirin and clopidogrel and is on a highly effective statin.  Most recent LDL cholesterol was 71 but has a chronically low HDL at 34.  Hemoglobin A1c a couple of months ago was slightly worse than previously at 7.6%.  Dig level checked in April was 0.6.  His right submandibular mass continues to slowly increase in size.  It does not really bother him except for cosmetic reasons, but it did become an issue when a barostim device was considered (that was later abandoned).    He underwent redo coronary artery bypass 2 (SVG to PDA, SVG to ramus), as well as mitral valve replacement (bioprosthetic Edwards magna 31 mm) on 07/11/2016,  complicated by AV block and atrial flutter requiring cardioversion. Estimated EF 20-25 % on his 07/18/16 TEE. A biventricular defibrillator (Medtronic Claria MRI CRT-D) was implanted on July 25, 2016. On December 18, 2016 he was hospitalized for small non-STEMI and was found to have interval occlusion of the old SVG to PDA, while the more recently placed SVG was still patent. LVEDP was quite low at only 3 mmHg during that catheterization. He had  atrial flutter ablation in August 2018 and is off anticoagulation. He had a NSTEMI in November 2018 after inguinal hernia repair surgery.  He understands the need for endocarditis prophylaxis with dental procedures or other medical procedures that might cause bacteremia and the indication for anticoagulation chronically.  He is still following up with Dr. Aundra Dubin and Dr. Curt Bears .  His endocrinologist is Dr. Dorris Fetch.    Past Medical History:  Diagnosis Date   AICD (automatic cardioverter/defibrillator) present    medtronic-   DR. Avenly Roberge , DR. Aundra Dubin    Anginal pain ([place])    cp sat 08/11/17   Anxiety    CAD (coronary artery disease)    CHF (congestive heart failure) ([place])    Complication of anesthesia    took awhile to wake up    Coronary artery disease involving coronary bypass graft    Cyst of neck    right side   DM2 (diabetes mellitus, type 2) ([place]) 08/26/2013   Dyspnea    Heart attack ([place])    "not sure when" (08/20/2017)   HTN (hypertension) 08/26/2013   Hyperlipidemia 08/26/2013   Hypothyroidism    Left main coronary artery disease    Left renal artery stenosis ([place])    Genesis 6x12 stent 2007   Obesity (BMI 30.0-34.9) 08/26/2013   Postoperative atrial fibrillation ([place]) 10/15/2005   Presence of permanent cardiac pacemaker    S/P CABG x 4 10/13/2005   LIMA to LAD, SVG to intermediate branch, sequential SVG to PDA and RPL branch, EVH via right thigh   S/P mitral valve replacement with bioprosthetic valve 07/11/2016   31 mm [facility] Mitral bovine bioprosthetic tissue valve   S/P redo CABG x 2 07/11/2016   SVG to PDA and SVG to Intermediate Branch, EVH via left thigh    Past Surgical History:  Procedure Laterality Date   A-FLUTTER ABLATION N/A 05/11/2017   Procedure: A-Flutter Ablation;  Surgeon: Constance Haw, MD;  Location: [place] [facility];  Service: Cardiovascular;  Laterality: N/A;   CARDIAC CATHETERIZATION N/A 06/21/2016   Procedure: Right/Left Heart Cath  and Coronary/Graft Angiography;  Surgeon: Sherren Mocha, MD;  Location: [place] [facility];  Service: Cardiovascular;  Laterality: N/A;   CARDIAC VALVE REPLACEMENT     CARDIOVERSION N/A 07/19/2016   Procedure: CARDIOVERSION;  Surgeon: Lelon Perla, MD;  Location: [facility] ENDOSCOPY;  Service: Cardiovascular;  Laterality: N/A;   CARDIOVERSION N/A 09/08/2016   Procedure: CARDIOVERSION;  Surgeon: Fay Records, MD;  Location: [facility] ENDOSCOPY;  Service: Cardiovascular;  Laterality: N/A;   CORONARY ANGIOPLASTY     STENT 2016  [facility]   CORONARY ANGIOPLASTY WITH STENT PLACEMENT     DES in SVG to right coronary artery system   CORONARY ARTERY BYPASS GRAFT  10/13/2005   LIMA to LAD, SVG to intermediate branch, sequential SVG to PDA and RPL   CORONARY ARTERY BYPASS GRAFT N/A 07/11/2016   Procedure: REDO CORONARY ARTERY BYPASS GRAFTING (CABG) x two using left leg greater saphenous vein harvested endoscopically-SVG to PDA -SVG to RAMUS  INTERMEDIATE;  Surgeon: Rexene Alberts, MD;  Location: [place];  Service: Open Heart Surgery;  Laterality: N/A;   CORONARY ARTERY BYPASS GRAFT N/A 07/11/2016   Procedure: Re-exploration (CABG) for post op bleeding,;  Surgeon: Rexene Alberts, MD;  Location: [place];  Service: Open Heart Surgery;  Laterality: N/A;   EP IMPLANTABLE DEVICE N/A 07/25/2016   Procedure: BiV ICD Insertion CRT-D;  Surgeon: Will Meredith Leeds, MD;  Location: [place] [facility];  Service: Cardiovascular;  Laterality: N/A;   INGUINAL HERNIA REPAIR Left 08/20/2017   INGUINAL HERNIA REPAIR Left 08/20/2017   Procedure: OPEN LEFT INGUINAL HERNIA REPAIR;  Surgeon: Alphonsa Overall, MD;  Location: [place];  Service: General;  Laterality: Left;   LEFT HEART CATH AND CORS/GRAFTS ANGIOGRAPHY N/A 12/18/2016   Procedure: Left Heart Cath and Cors/Grafts Angiography;  Surgeon: Sherren Mocha, MD;  Location: [place] [facility];  Service: Cardiovascular;  Laterality: N/A;   LEFT HEART CATH AND CORS/GRAFTS ANGIOGRAPHY  N/A 08/22/2017   Procedure: LEFT HEART CATH AND CORS/GRAFTS ANGIOGRAPHY;  Surgeon: Larey Dresser, MD;  Location: [place] [facility];  Service: Cardiovascular;  Laterality: N/A;   MITRAL VALVE REPLACEMENT N/A 07/11/2016   Procedure: MITRAL VALVE (MV) REPLACEMENT;  Surgeon: Rexene Alberts, MD;  Location: [place];  Service: Open Heart Surgery;  Laterality: N/A;   MYOCARDICAL PERFUSION  10/09/2007   NORMAL PERFUSION IN ALL REGIONS;NO EVIDENCE OF INDUCIBLE ISCHEMIA;POST STRESS EF% 66   RENAL ARTERY STENT Right 2007   RENAL DOPPLER  03/28/2010   RIGHT RA-NORMAL;LEFT PROXIMAL RA AT STENT-PATENT WITH NO EVIDENCE OF SIGN DIAMETER REDUCTION. R & L KIDNEYS: EQUAL IN SIZE,SYMMETRICAL IN SHAPE.   RIGHT HEART CATH N/A 01/08/2020   Procedure: RIGHT HEART CATH;  Surgeon: Larey Dresser, MD;  Location: [place] [facility];  Service: Cardiovascular;  Laterality: N/A;   TEE WITHOUT CARDIOVERSION N/A 06/15/2016   Procedure: TRANSESOPHAGEAL ECHOCARDIOGRAM (TEE);  Surgeon: Sanda Klein, MD;  Location: [facility] ENDOSCOPY;  Service: Cardiovascular;  Laterality: N/A;   TEE WITHOUT CARDIOVERSION N/A 07/11/2016   Procedure: TRANSESOPHAGEAL ECHOCARDIOGRAM (TEE);  Surgeon: Rexene Alberts, MD;  Location: [place];  Service: Open Heart Surgery;  Laterality: N/A;   TEE WITHOUT CARDIOVERSION N/A 07/19/2016   Procedure: TRANSESOPHAGEAL ECHOCARDIOGRAM (TEE);  Surgeon: Lelon Perla, MD;  Location: [facility] ENDOSCOPY;  Service: Cardiovascular;  Laterality: N/A;   TRANSESOPHAGEAL ECHOCARDIOGRAM  10/19/2005   NORMAL LV; MILD TO MODERATE AMOUNT OF SOFT ATHEROMATOUS PLAQUE OF THE THORACIC AORTA; THE LEFT ATRIUM IS MILDLY DILATED;LEFT ATRIAL APPENDAGE FUNCTION IS NORMAL;NO THROMBUS IDENTIFIED. SMALL PFO WITH RIGHT TO LEFT SHUNT    Current Medications: Outpatient Medications Prior to Visit  Medication Sig Dispense Refill   aspirin EC 81 MG tablet Take 81 mg by mouth daily.      Blood Glucose Monitoring Suppl (ACCU-CHEK GUIDE ME) w/Device KIT 1  Piece by Does not apply route as directed. 1 kit 0   carvedilol (COREG) 12.5 MG tablet TAKE 1 & 1/2 (ONE & ONE-HALF) TABLETS BY MOUTH TWICE DAILY WITH A MEAL 180 tablet 3   cholecalciferol (VITAMIN D3) 25 MCG (1000 UNIT) tablet Take 2,000 Units by mouth daily with breakfast.     clopidogrel (PLAVIX) 75 MG tablet Take 1 tablet by mouth once daily 90 tablet 0   dapagliflozin propanediol (FARXIGA) 10 MG TABS tablet TAKE 1 TABLET BY MOUTH ONCE DAILY BEFORE BREAKFAST **DICOUNTINE TRADJENTA** 90 tablet 3   digoxin (LANOXIN) 0.125 MG tablet Take 1/2 (one-half) tablet by mouth  once daily 45 tablet 4   eplerenone (INSPRA) 25 MG tablet Take 1 tablet (25 mg total) by mouth daily. 90 tablet 3   glucose blood (ACCU-CHEK GUIDE) test strip Use  to monitor glucose once a day at fasting. 100 each 2   LOKELMA 10 g PACK packet DISSOLVE 1 PACKET IN WATER & DRINK ONCE DAILY 90 each 0   neomycin-bacitracin-polymyxin (NEOSPORIN) ointment Apply 1 application topically daily as needed for wound care.     nitroGLYCERIN (NITROSTAT) 0.4 MG SL tablet DISSOLVE ONE TABLET UNDER THE TONGUE EVERY 5 MINUTES AS NEEDED FOR CHEST PAIN.  DO NOT EXCEED A TOTAL OF 3 DOSES IN 15 MINUTES 25 tablet 2   rosuvastatin (CRESTOR) 10 MG tablet Take 1 tablet (10 mg total) by mouth daily. 90 tablet 3   sacubitril-valsartan (ENTRESTO) 97-103 MG Take 1 tablet by mouth 2 (two) times daily. 180 tablet 3   Tetrahydrozoline HCl (VISINE OP) Place 1 drop into both eyes daily as needed (irritation).     levothyroxine (SYNTHROID) 25 MCG tablet Take 1 tablet (25 mcg total) by mouth daily before breakfast. Take 1 pill of 25 mcg every other day alternating with 1 pill of 50 mcg every other day. 45 tablet 0   levothyroxine (SYNTHROID) 50 MCG tablet Take 1 pill of 50 mcg every other day alternating with 1 pill of 25 mcg every other day. 45 tablet 0   amoxicillin (AMOXIL) 500 MG tablet Take 4 tablets (2,000 mg total) by mouth See admin instructions. Take 2000 mg 1  hour prior to dental work (Patient not taking: Reported on 04/24/2022) 4 tablet 0   furosemide (LASIX) 20 MG tablet Take 1 tablet (20 mg total) by mouth daily as needed for fluid or edema. (Patient not taking: Reported on 01/26/2022) 45 tablet 3   No facility-administered medications prior to visit.     Allergies:   Xanax [alprazolam]   Social History   Socioeconomic History   Marital status: Married    Spouse name: Not on file   Number of children: 0   Years of education: 18   Highest education level: Not on file  Occupational History   Occupation: retired  Tobacco Use   Smoking status: Never   Smokeless tobacco: Never  Vaping Use   Vaping Use: Never used  Substance and Sexual Activity   Alcohol use: No   Drug use: No   Sexual activity: Never  Other Topics Concern   Not on file  Social History Narrative   Right handed   Two story home   Drinks half and half coffee   Social Determinants of Health   Financial Resource Strain: Not on file  Food Insecurity: Not on file  Transportation Needs: Not on file  Physical Activity: Not on file  Stress: Not on file  Social Connections: Not on file     Family History:  The patient's family history includes Heart attack in his father and mother; Heart disease in his father, maternal grandmother, and mother; Hypertension in his father and sister.   ROS:   Please see the history of present illness.    ROS All other systems are reviewed and are negative.   PHYSICAL EXAM:   VS:  BP 132/64 (BP Location: Left Arm, Patient Position: Sitting, Cuff Size: Large)   Pulse 67   Ht 6' (1.829 m)   Wt 198 lb 6.4 oz (90 kg)   SpO2 94%   BMI 26.91 kg/m  General: Alert, oriented x3, no distress, the subclavian pacemaker site Head: no evidence of trauma, PERRL, EOMI, no exophtalmos or lid lag, no myxedema, no xanthelasma; normal ears, nose and oropharynx.  Large soft cystic mass in the right mandibular angle area is unchanged Neck:  normal jugular venous pulsations and no hepatojugular reflux; brisk carotid pulses without delay and no carotid bruits Chest: clear to auscultation, no signs of consolidation by percussion or palpation, normal fremitus, symmetrical and full respiratory excursions Cardiovascular: normal position and quality of the apical impulse, regular rhythm with frequent ectopy, normal first and second heart sounds, no murmurs, rubs or gallops Abdomen: no tenderness or distention, no masses by palpation, no abnormal pulsatility or arterial bruits, normal bowel sounds, no hepatosplenomegaly Extremities: no clubbing, cyanosis or edema; 2+ radial, ulnar and brachial pulses bilaterally; 2+ right femoral, posterior tibial and dorsalis pedis pulses; 2+ left femoral, posterior tibial and dorsalis pedis pulses; no subclavian or femoral bruits Neurological: grossly nonfocal Psych: Normal mood and affect     Wt Readings from Last 3 Encounters:  04/24/22 198 lb 6.4 oz (90 kg)  02/01/22 199 lb 6.4 oz (90.4 kg)  01/26/22 201 lb 6.4 oz (91.4 kg)      Studies/Labs Reviewed:   ECHO 12/01/2019  1. No LV thrombus identified.   2. Left ventricular ejection fraction, by estimation, is 20 to 25%. The  left ventricle has severely decreased function. The left ventricle  demonstrates global hypokinesis. Left ventricular diastolic function could  not be evaluated.   3. Right ventricular systolic function is normal. The right ventricular  size is normal. There is normal pulmonary artery systolic pressure.   4. 07/11/16 54m Edwards Magna Mitral Valve Replacement      . The mitral valve has been repaired/replaced. No evidence of mitral  valve regurgitation. No evidence of mitral stenosis.   5. The aortic valve is normal in structure and function. Aortic valve  regurgitation is not visualized. No aortic stenosis is present.   6. The inferior vena cava is dilated in size with >50% respiratory  variability, suggesting right  atrial pressure of 8 mmHg.     EKG: ECG is ordered today and showed alternating atrial sensed (sinus) atrial paced beats with biventricular pacing as well as frequent PVCs.  The paced QRS complex is rather broad at 186 ms, but has a distinct positive R wave in lead V1.  QTc 509 ms. Recent Labs: 01/26/2022: TSH 2.927 02/09/2022: BUN 31; Creatinine, Ser 1.56; Potassium 4.6; Sodium 142   Lipid Panel    Component Value Date/Time   CHOL 132 07/06/2021 1101   CHOL 120 10/01/2018 0925   TRIG 135 07/06/2021 1101   HDL 34 (L) 07/06/2021 1101   HDL 31 (L) 10/01/2018 0925   CHOLHDL 3.9 07/06/2021 1101   VLDL 27 07/06/2021 1101   LDLCALC 71 07/06/2021 1101   LDLCALC 60 10/01/2018 0925    ASSESSMENT:    1. Chronic combined systolic and diastolic heart failure ([place]   2. Coronary artery disease of native artery of native heart with stable angina pectoris ([facility]   3. History of atrial flutter   4. Status post mitral valve replacement with bioprosthetic valve   5. Presence of biventricular implantable cardioverter-defibrillator (ICD)   6. PVCs (premature ventricular contractions)   7. Essential hypertension   8. Dyslipidemia (high LDL; low HDL)   9. Type 2 diabetes mellitus with stage 3b chronic kidney disease, without long-term current use of insulin ([place]   10. Stage  3b chronic kidney disease ([place])   11. History of stent insertion of renal artery   12. Submandibular gland mass   13. Mass of upper lobe of left lung       PLAN:  In order of problems listed above:  CHF: He no longer has any symptoms of heart failure exacerbation (currently back to NYHA class II), no overt signs of hypervolemia on physical exam, but his OptiVol has not returned to baseline.  It seems he is lost some true weight.  We will reset his "dry weight" to 190 pounds on his home scale (roughly 195 pounds on the office scale).  We will place him on diuretics until his weight gets down to 190 pounds then he can continue  taking them only as needed.  He is on maximum tolerated doses of carvedilol, Entresto and eplerenone and dapagliflozin.  Also on digoxin.  Requires Lokelma to keep his potassium in normal range.  Checked last week it was 4.0. CAD: Current medical regimen is working well, he is asymptomatic with activity. AFlutter: No recurrence documented on his pacemaker since the ablation procedure.  He is not currently receiving antiarrhythmics or anticoagulation. S/P MVR: Aware of the need for endocarditis prophylaxis.  Normal bioprosthetic valve function by echo in March 2021.   Echocardiogram scheduled for August 30. CRT-D: [place] normally, but effective biventricular pacing is limited by frequent PVCs.  Briefly discussed with Dr. Curt Bears.  If he becomes more symptomatic can consider treatment with mexiletine or even possibly ablation.  Continue beta-blocker PVCs: So far we have decided not to provide specific treatment for the PVCs, other than the beta-blocker.  He did not tolerate higher doses of carvedilol due to hypotension.  Hx HTN: Blood pressure is a little higher than usual today, but remains in desirable range.  Has not had any issues with orthostatic dizziness recently.  I suspect he will do well with the higher dose of diuretic. HLP: LDL cholesterol in target range, although HDL remains low. DM: Has deteriorated slightly.  Previously hemoglobin A1c was in the mid 6% range, now up to 7.6%.  This is also probably due to more lax compliance with dietary restrictions. CKD 3b: Stable renal function with a creatinine around 1.5, GFR roughly 45.  Requires Lokelma to prevent hyperkalemia while taking Entresto and eplerenone. History of left renal artery stenosis status post stent 2007.  Stable renal function and well-controlled hypertension. Submandibular mass: Reluctant to undergo general anesthesia which would be necessary for resection of this mass.  Also aware of the risk for cranial nerve palsy.  Precluded  implantation of a Barostim device. Left upper lobe paramediastinal mass: plan for repeat CT scan in 3 months.    Medication Adjustments/Labs and Tests Ordered: Current medicines are reviewed at length with the patient today.  Concerns regarding medicines are outlined above.  Medication changes, Labs and Tests ordered today are listed in the Patient Instructions below. Patient Instructions  Medication Instructions:  FUROSEMIDE: Take 20 mg once daily as needed for a weight over 190 lbs  *If you need a refill on your cardiac medications before your next appointment, please call your pharmacy*   Lab Work: You will need to have a BMET drawn in 3 months, prior to the [place]. You may have this done at any [facility]  If you have labs (blood work) drawn today and your tests are completely normal, you will receive your results only by: [place] (if you have MyChart) OR A paper copy in  the mail If you have any lab test that is abnormal or we need to change your treatment, we will call you to review the results.   Testing/Procedures: A chest ct has been ordered. [facility] will call you to set this up. This should be done in 3 months   Follow-Up: At [facility], you and your health needs are our priority.  As part of our continuing mission to provide you with exceptional heart care, we have created designated Provider Care Teams.  These Care Teams include your primary Cardiologist (physician) and Advanced Practice Providers (APPs -  Physician Assistants and Nurse Practitioners) who all work together to provide you with the care you need, when you need it.  We recommend signing up for the patient portal called "MyChart".  Sign up information is provided on this After Visit Summary.  MyChart is used to connect with patients for Virtual Visits (Telemedicine).  Patients are able to view lab/test results, encounter notes, upcoming appointments, etc.  Non-urgent messages can be sent to your  provider as well.   To learn more about what you can do with MyChart, go to [email or web address].    Your next appointment:   4 month(s) on a device day  The format for your next appointment:   In Person  Provider:   Dr. Sallyanne Kuster  Important Information About Sugar         Signed, Sanda Klein, MD  04/26/2022 3:57 PM    [address], [place], [place]  [address] Phone: [phone]; Fax: [phone]

## 2022-04-25 ENCOUNTER — Ambulatory Visit (INDEPENDENT_AMBULATORY_CARE_PROVIDER_SITE_OTHER): Payer: Medicare Other

## 2022-04-25 DIAGNOSIS — I5022 Chronic systolic (congestive) heart failure: Secondary | ICD-10-CM

## 2022-04-25 LAB — CUP PACEART REMOTE DEVICE CHECK
Battery Remaining Longevity: 15 mo
Battery Voltage: 2.88 V
Brady Statistic AP VP Percent: 68.72 %
Brady Statistic AP VS Percent: 1.42 %
Brady Statistic AS VP Percent: 28.08 %
Brady Statistic AS VS Percent: 1.78 %
Brady Statistic RA Percent Paced: 65.4 %
Brady Statistic RV Percent Paced: 89.47 %
Date Time Interrogation Session: 20230725102527
HighPow Impedance: 59 Ohm
Implantable Lead Implant Date: 20171024
Implantable Lead Implant Date: 20171024
Implantable Lead Implant Date: 20171024
Implantable Lead Location: 753858
Implantable Lead Location: 753859
Implantable Lead Location: 753860
Implantable Lead Model: 4598
Implantable Lead Model: 5076
Implantable Pulse Generator Implant Date: 20171024
Lead Channel Impedance Value: 249.509
Lead Channel Impedance Value: 249.509
Lead Channel Impedance Value: 249.509
Lead Channel Impedance Value: 275.5 Ohm
Lead Channel Impedance Value: 275.5 Ohm
Lead Channel Impedance Value: 304 Ohm
Lead Channel Impedance Value: 399 Ohm
Lead Channel Impedance Value: 418 Ohm
Lead Channel Impedance Value: 456 Ohm
Lead Channel Impedance Value: 551 Ohm
Lead Channel Impedance Value: 551 Ohm
Lead Channel Impedance Value: 551 Ohm
Lead Channel Impedance Value: 665 Ohm
Lead Channel Impedance Value: 874 Ohm
Lead Channel Impedance Value: 874 Ohm
Lead Channel Impedance Value: 893 Ohm
Lead Channel Impedance Value: 931 Ohm
Lead Channel Impedance Value: 950 Ohm
Lead Channel Pacing Threshold Amplitude: 0.5 V
Lead Channel Pacing Threshold Amplitude: 1 V
Lead Channel Pacing Threshold Amplitude: 1.375 V
Lead Channel Pacing Threshold Pulse Width: 0.4 ms
Lead Channel Pacing Threshold Pulse Width: 0.4 ms
Lead Channel Pacing Threshold Pulse Width: 1 ms
Lead Channel Sensing Intrinsic Amplitude: 1.125 mV
Lead Channel Sensing Intrinsic Amplitude: 1.125 mV
Lead Channel Sensing Intrinsic Amplitude: 19.875 mV
Lead Channel Sensing Intrinsic Amplitude: 19.875 mV
Lead Channel Setting Pacing Amplitude: 2 V
Lead Channel Setting Pacing Amplitude: 2 V
Lead Channel Setting Pacing Amplitude: 2 V
Lead Channel Setting Pacing Pulse Width: 0.4 ms
Lead Channel Setting Pacing Pulse Width: 1 ms
Lead Channel Setting Sensing Sensitivity: 0.3 mV

## 2022-04-26 ENCOUNTER — Encounter: Payer: Self-pay | Admitting: Cardiovascular Disease

## 2022-04-27 ENCOUNTER — Encounter: Payer: Self-pay | Admitting: Cardiovascular Disease

## 2022-04-27 MED ORDER — AMOXICILLIN 500 MG PO TABS: 2000.0000 mg | ORAL_TABLET | ORAL | 0 refills | Status: DC

## 2022-05-01 ENCOUNTER — Telehealth: Payer: Self-pay

## 2022-05-01 NOTE — Telephone Encounter (Signed)
-----   Message from Sanda Klein, MD sent at 04/24/2022  9:36 AM EDT ----- Can we please enroll him in the heart failure device clinic? Just restarted him on a low-dose of loop diuretic.  Had his first ER visit for heart failure in over 2 years last week. Please do his first download sometime next week, July 31 or later and then monthly. Has a follow-up in the heart failure clinic with Dr. Aundra Dubin end of August. Thank you! Mihai

## 2022-05-01 NOTE — Telephone Encounter (Signed)
Thank you, Margarita Grizzle. The optivol trend fits perfectly with the clinical presentation.

## 2022-05-01 NOTE — Telephone Encounter (Signed)
Referred to [facility] clinic by Dr Sallyanne Kuster after 7/24 OV.   Spoke with patient and ICM intro given.  Patient is agreeable to ICM monthly follow up.    Advised Dr Sallyanne Kuster requested a device report today to review fluid levels since he started on Furosemide 20 mg on 7/24.  He reports weight was 196.5 lbs last week and today's weight was 188.8 lbs.  Last dose of Lasix was 7/28.  He understands to take Furosemide daily for weight of 190 lbs or more.  SOB has resolved since taking Furosemide.  Pt agreed to send manual transmission for review.    05/01/2022 Optivol impedance suggesting fluid levels return to normal after starting Furosemide 20 mg x 4 days.     Provided ICM direct number and explained should call if experiencing any fluid symptoms such as weight gain, shortness of breath or extremity/abdominal swelling.  1st ICM remote transmission scheduled for 05/29/2022.   Copy sent to Dr Sallyanne Kuster for review.

## 2022-05-23 NOTE — Progress Notes (Signed)
Remote ICD transmission.   

## 2022-05-29 ENCOUNTER — Ambulatory Visit (INDEPENDENT_AMBULATORY_CARE_PROVIDER_SITE_OTHER): Payer: Medicare Other

## 2022-05-29 DIAGNOSIS — Z95810 Presence of automatic (implantable) cardiac defibrillator: Secondary | ICD-10-CM | POA: Diagnosis not present

## 2022-05-29 DIAGNOSIS — I5022 Chronic systolic (congestive) heart failure: Secondary | ICD-10-CM

## 2022-05-29 NOTE — Progress Notes (Signed)
EPIC Encounter for ICM Monitoring  Patient Name: Christian Bowman is a 76 y.o. male Date: 05/29/2022 Primary Care Physican: Sharilyn Sites, MD Primary Cardiologist: Aundra Dubin Electrophysiologist: Croitoru Bi-V Pacing: 89%  05/28/2022 Weight: 188.6 lbs 05/29/2022 Weight: 186.1 lbs        1st ICM remote transmission.  Spoke with wife, per DPR.  Pt prefers to communicate with wife for ICM calls.  Heart Failure questions reviewed.  Pt experienced abdominal midsection fullness around and shortness of breath over the last few days. He took PRN Furosemide last week and yesterday (8/27) with relief of symptoms.  Transmission results reviewed.    Diet: Wife records sodium intake by review food labels.  If patient eats at restaurant then she makes sure he has low salt intake day before and day after.   Optivol thoracic impedance suggesting possible fluid accumulation starting 8/15 and trending back to baseline today, 8/28 after taking PRN Furosemide.   Prescribed:  Furosemide 20 mg Take 1 tablet (20 mg total) by mouth daily as needed (for a weight over 190 lbs).  Recommendations: Advised to take 1 more day of PRN Furosemide if needed for remainder of possible fluid accumulation showing today.   Advised to call for fluid symptoms  Follow-up plan: ICM clinic phone appointment on 07/03/2022.   91 day device clinic remote transmission 10/24/2022.    EP/Cardiology Office Visits: 05/31/2022 with Dr. Aundra Dubin.   08/28/2022 with Dr Sallyanne Kuster.    Copy of ICM check sent to Dr. Sallyanne Kuster.   3 month ICM trend: 05/29/2022.    12-14 Month ICM trend:     Rosalene Billings, RN 05/29/2022 2:19 PM

## 2022-05-31 ENCOUNTER — Ambulatory Visit (HOSPITAL_COMMUNITY)
Admission: RE | Admit: 2022-05-31 | Discharge: 2022-05-31 | Disposition: A | Discharge status: Home / Self Care | Payer: Medicare Other | Source: Ambulatory Visit | Attending: Cardiology | Admitting: Cardiology

## 2022-05-31 ENCOUNTER — Encounter (HOSPITAL_COMMUNITY): Payer: Self-pay | Admitting: Cardiology

## 2022-05-31 ENCOUNTER — Other Ambulatory Visit (HOSPITAL_COMMUNITY): Payer: Medicare Other

## 2022-05-31 ENCOUNTER — Ambulatory Visit (HOSPITAL_BASED_OUTPATIENT_CLINIC_OR_DEPARTMENT_OTHER)
Admission: RE | Admit: 2022-05-31 | Discharge: 2022-05-31 | Disposition: A | Discharge status: Home / Self Care | Payer: Medicare Other | Source: Ambulatory Visit | Attending: Cardiology | Admitting: Cardiology

## 2022-05-31 ENCOUNTER — Other Ambulatory Visit (HOSPITAL_COMMUNITY): Payer: Self-pay | Admitting: Cardiology

## 2022-05-31 VITALS — BP 112/70 | HR 67 | Wt 191.6 lb

## 2022-05-31 DIAGNOSIS — E782 Mixed hyperlipidemia: Secondary | ICD-10-CM

## 2022-05-31 DIAGNOSIS — I493 Ventricular premature depolarization: Secondary | ICD-10-CM

## 2022-05-31 DIAGNOSIS — I5022 Chronic systolic (congestive) heart failure: Secondary | ICD-10-CM | POA: Insufficient documentation

## 2022-05-31 DIAGNOSIS — E119 Type 2 diabetes mellitus without complications: Secondary | ICD-10-CM | POA: Insufficient documentation

## 2022-05-31 DIAGNOSIS — Z953 Presence of xenogenic heart valve: Secondary | ICD-10-CM | POA: Diagnosis not present

## 2022-05-31 DIAGNOSIS — E785 Hyperlipidemia, unspecified: Secondary | ICD-10-CM | POA: Insufficient documentation

## 2022-05-31 DIAGNOSIS — I110 Hypertensive heart disease with heart failure: Secondary | ICD-10-CM | POA: Insufficient documentation

## 2022-05-31 LAB — BASIC METABOLIC PANEL
Anion gap: 9 (ref 5–15)
BUN: 28 mg/dL — ABNORMAL HIGH (ref 8–23)
CO2: 27 mmol/L (ref 22–32)
Calcium: 9.5 mg/dL (ref 8.9–10.3)
Chloride: 106 mmol/L (ref 98–111)
Creatinine, Ser: 1.49 mg/dL — ABNORMAL HIGH (ref 0.61–1.24)
GFR, Estimated: 49 mL/min — ABNORMAL LOW (ref >60)
Glucose, Bld: 188 mg/dL — ABNORMAL HIGH (ref 70–99)
Potassium: 4.1 mmol/L (ref 3.5–5.1)
Sodium: 142 mmol/L (ref 135–145)

## 2022-05-31 LAB — LIPID PANEL
Cholesterol: 129 mg/dL (ref 0–200)
HDL: 37 mg/dL — ABNORMAL LOW (ref >40)
LDL Cholesterol: 76 mg/dL (ref 0–99)
Total CHOL/HDL Ratio: 3.5 RATIO
Triglycerides: 81 mg/dL (ref <150)
VLDL: 16 mg/dL (ref 0–40)

## 2022-05-31 LAB — ECHOCARDIOGRAM COMPLETE
MV VTI: 0.81 cm2
S' Lateral: 5.1 cm
Weight: 3065.6 oz

## 2022-05-31 LAB — DIGOXIN LEVEL: Digoxin Level: 0.6 ng/mL — ABNORMAL LOW (ref 0.8–2.0)

## 2022-05-31 LAB — BRAIN NATRIURETIC PEPTIDE: B Natriuretic Peptide: 3674.3 pg/mL — ABNORMAL HIGH (ref 0.0–100.0)

## 2022-05-31 MED ORDER — LOKELMA 10 G PO PACK: PACK | ORAL | 0 refills | Status: DC

## 2022-05-31 MED ORDER — DIGOXIN 125 MCG PO TABS: ORAL_TABLET | ORAL | 4 refills | Status: DC

## 2022-05-31 MED ORDER — EPLERENONE 50 MG PO TABS: 50.0000 mg | ORAL_TABLET | Freq: Every day | ORAL | 3 refills | Status: DC

## 2022-05-31 NOTE — Patient Instructions (Addendum)
Increase Eplerenone to '50mg'$  daily.  Labs done today, your results will be available in MyChart, we will contact you for abnormal readings.   Repeat blood work in 10 days   Your provider has recommended that  you wear a Zio Patch for 7 days.  This monitor will record your heart rhythm for our review.  IF you have any symptoms while wearing the monitor please press the button.  If you have any issues with the patch or you notice a red or orange light on it please call the company at [phone].  Once you remove the patch please mail it back to the company as soon as possible so we can get the results.  Your physician recommends that you schedule a follow-up appointment in: 4 months.  If you have any questions or concerns before your next appointment please send [place] a message through [employer] or call our office at [phone].    TO LEAVE A MESSAGE FOR THE NURSE SELECT OPTION 2, PLEASE LEAVE A MESSAGE INCLUDING: YOUR NAME DATE OF BIRTH CALL BACK NUMBER REASON FOR CALL**this is important as we prioritize the call backs  YOU WILL RECEIVE A CALL BACK THE SAME DAY AS LONG AS YOU CALL BEFORE 4:00 PM  At the [facility], you and your health needs are our priority. As part of our continuing mission to provide you with exceptional heart care, we have created designated Provider Care Teams. These Care Teams include your primary Cardiologist (physician) and Advanced Practice Providers (APPs- Physician Assistants and Nurse Practitioners) who all work together to provide you with the care you need, when you need it.   You may see any of the following providers on your designated Care Team at your next follow up: Dr Glori Bickers Dr Loralie Champagne Dr. Roxana Hires, NP Lyda Jester, [place] [facility], [place] Forestine Na, NP Audry Riles, PharmD   Please be sure to bring in all your medications bottles to every appointment.

## 2022-05-31 NOTE — Progress Notes (Signed)
Zio patch placed onto patient.  All instructions and information reviewed with patient, they verbalize understanding with no questions. 

## 2022-05-31 NOTE — Progress Notes (Signed)
  Echocardiogram 2D Echocardiogram has been performed.  Christian Bowman 05/31/2022, 11:08 AM

## 2022-06-01 NOTE — Progress Notes (Signed)
Date:  06/01/2022   ID:  [name], DOB 01-Sep-1946, MRN [id number]   Provider location: [place] [facility] Type of Visit: Established patient   PCP:  Sharilyn Sites, MD  Cardiologist: Dr. Sallyanne Kuster  HF Cardiologist:  Loralie Champagne, MD   History of Present Illness: Christian Bowman is a 76 y.o. male with history of CAD s/p CABG in 2007 then redo CABG in 10/17 with mitral valve replacement.  He was admitted in 10/17 for redo CABG with SVG-PDA and SVG-ramus.  He also had mitral valve replacement with a bioprosthetic valve because of infarct-related mitral regurgitation.  Post-operative course was complicated by CHF requiring diuresis.  He also had atrial flutter and required DCCV.  Due to complete heart block, he later got a CRT-D system.     At a prior visit, he was in atrial flutter.  He saw Dr. Curt Bears, it was decided to arrange for DCCV with ablation down the road when PPM leads have been in longer.  He had successful DCCV in 12/17 and is in NSR today.    He was admitted in 3/18 with NSTEMI and chest pain. TnI only 0.5.  LHC showed occlusion of SVG-PDA from CABG#1 to be the likely culprit.  However, SVG-PDA from CABG#2 was patent.  No intervention.  Echo in 3/18 from [place] showed EF 30-35%, stable bioprosthetic mitral valve.    He had atrial flutter ablation in 8/18.  He is in NSR today and is off amiodarone.     In 11/18, he had an inguinal hernia repair. Post-op, he had an NSTEMI.  LHC showed occlusion of a PLV branch that had been backfilled by SVG-PDA (prior cath had shown severe diffuse disease in the PLV).  Echo in 11/18 showed EF 20-25% with normal bioprosthetic mitral valve.     With no recurrence of atrial arrhythmias, he is now off anticoagulation.   Echo in 7/20 showed EF 25-30%, bioprosthetic MV with no MR and mean gradient 5 mmHg, normal RV.    In 1/21, he developed right hand weakness and dysarthria concerning for CVA.  He went to the ER in [place] and  had tPA.  Symptoms resolved.  Atrial fibrillation was not noted per his wife's report.  CTA head/neck showed no significant carotid stenosis.  He was started on Plavix 75 mg daily and sent home.    CPX (3/21) showed severe functional impairment due to HF.  Echo showed EF 20-25%, normal RV, normally functioning bioprosthetic mitral valve.  RHC in 4/21 showed low filling pressures and low cardiac index (2.1 Fick, 1.9 thermo).  He saw Dr. Orvan Seen to discuss possible lateral thoracotomy access LVAD. He decided against LVAD placement.   Echo was done today and reviewed, EF < 20%, mild LV dilation, mildly decreased RV systolic function, PASP 37 mmHg, bioprosthetic mitral valve with mean gradient 5 mmHg and no MR, IVC normal.    He returns for followup of CHF.   He went to the ER in [place] in 7/23 with shortness of breath/CHF, he took Lasix for several days in a row.  Back to baseline currently.  He is short of breath walking 100 yards or more.  No orthopnea/PND.  No recent chest pain. He is having trouble sleeping. Weight down 10 lbs.   Medtronic device interrogation: No AF/VT, stable thoracic impedance, 87% effective CRT (suspect low due to PVCs).    ECG (personally reviewed): A-BiV pacing with PVCs   Labs (11/17): K  4.8, creatinine 1.89, hgb 12.3, digoxin 0.9, LFTs normal, TSH 8.235 (mild increase), free T3 low, free T4 normal, LFTs normal.  Labs (12/17): K 4.5, creatinine 1.7, BNP 3909 Labs (3/18): K 3.9, creatinine 1.48, hgb 11.3 Labs (4/18): digoxin 0.5, LFTs normal Labs (7/18): K 5.1, creatinine 1.78, LDL 63, HDL 29, TSH elevated Labs (11/18): K 4.5, creatinine 1.86, hgb 13.6 Labs (12/18): K 5, creatinine 1.88 Labs (1/19): digoxin 0.3, K 4.7, creatinine 1.83 Labs (6/19): LDL 70, HDL 32, K 5.1, creatinine 1.88 Labs (12/19): LDL 60, HDL 31 Labs (1/20): K 5.1, creatinine 1.64 Labs (4/20): K 4.5, creatinine 1.77 Labs (8/20): K 4.6, creatinine 1.5 Labs (1/21): K 4.5, creatinine 1.58, LDL  69 Labs (2/21): K 4.5, creatinine 1.66 Labs (4/21): K 4.1, creatinine 1.63, digoxin 0.4 Labs (5/21): K 4.4, creatinine 1.51 Labs (6/21): K 4.4, creatinine 1.51, digoxin 0.3 Labs (12/21): K 4.9, creatinine 1.69 Labs (5/22): K 4.5, creatinine 1.48 Labs (10/22): LDL 71 Labs (1/23): K 4.5, creatinine 1.49 Labs (7/23): K 4, creatinine 1.59   PMH: 1. CAD: CABG 2007.  - LHC (9/17) with patent LIMA-LAD, totally occluded SVG-D, severe stenosis in SVG-PDA.  - Redo CABG 10/17 with SVG-PDA, SVG-ramus and mitral valve replacement.  - NSTEMI 3/18.  LHC with culprit lesion likely occlusion of SVG-PDA from CABG#1.  Patent SVG-PDA from CABG#2.  No intervention.  - NSTEMI 11/18 post-op inguinal hernia repair.  LHC with occlusion of a PLV branch that had been backfilled by SVG-PDA (prior cath had shown severe diffuse disease in the PLV).  2. Mitral regurgitation: Ischemic MR, mitral valve replacement was done in 10/17 (bioprosthetic). 3. Complete heart block: Post-op in 10/17.  Medtronic CRT-D placed.  4. Atrial flutter: DCCV 10/17 and again in 12/17.  - Ablation 8/18, now off amiodarone.  5. Type II diabetes 6. Hyperlipidemia 7. Chronic systolic CHF: Ischemic cardiomyopathy.   - TEE (10/17): EF 20-25%, severe LV dilation - Echo (3/18, [place]): EF 30-35%, bioprosthetic mitral valve with mean gradient 5 mmHg.  - Echo (11/18): EF 20-25%, moderate LV dilation, normal bioprosthetic mitral valve.  - Echo (7/20): EF 25-30%, moderate LV dilation, inferior/inferolateral AK, bioprosthetic MV with mean gradient 5 and no MR, normal RV.  - Painful gynecomastia with spironolactone.  - Echo (1/21): EF < 20%, normal RV size and systolic function, bioprosthetic mitral valve with mild MR, mean gradient 2 mmHg.  - CPX (3/21): peak VO2 11.2, VE/VCO2 slope 56, RER 1.21.  - Echo (3/21): EF 20-25%, no LV thrombus, RV normal, bioprosthetic mitral valve appear normal.  - RHC (4/21): mean RA 3, PA 25/7, mean PCWP 11, CI 2.1  (Fick), 1.9 (thermo).  - Echo (8/23): EF < 20%, mild LV dilation, mildly decreased RV systolic function, PASP 37 mmHg, bioprosthetic mitral valve with mean gradient 5 mmHg and no MR, IVC normal.  8. Hypothyroidism 9. CVA 1/21: CTA head/neck with no carotid stenosis.  10. PVCs  Current Outpatient Medications  Medication Sig Dispense Refill   amoxicillin (AMOXIL) 500 MG tablet Take 4 tablets (2,000 mg total) by mouth See admin instructions. Take 2000 mg 1 hour prior to dental work 4 tablet 0   aspirin EC 81 MG tablet Take 81 mg by mouth daily.      Blood Glucose Monitoring Suppl (ACCU-CHEK GUIDE ME) w/Device KIT 1 Piece by Does not apply route as directed. 1 kit 0   carvedilol (COREG) 12.5 MG tablet TAKE 1 & 1/2 (ONE & ONE-HALF) TABLETS BY MOUTH TWICE DAILY WITH A MEAL  180 tablet 3   cholecalciferol (VITAMIN D3) 25 MCG (1000 UNIT) tablet Take 2,000 Units by mouth daily with breakfast.     clopidogrel (PLAVIX) 75 MG tablet Take 1 tablet by mouth once daily 90 tablet 0   dapagliflozin propanediol (FARXIGA) 10 MG TABS tablet TAKE 1 TABLET BY MOUTH ONCE DAILY BEFORE BREAKFAST **DICOUNTINE TRADJENTA** 90 tablet 3   furosemide (LASIX) 20 MG tablet Take 1 tablet (20 mg total) by mouth daily as needed (for a weight over 190 lbs). 30 tablet 3   glucose blood (ACCU-CHEK GUIDE) test strip Use  to monitor glucose once a day at fasting. 100 each 2   levothyroxine (SYNTHROID) 25 MCG tablet TAKE 1 TABLET BY MOUTH EVERY OTHER DAY BEFORE BREAKFAST ALTERNATING WITH 1 TABLET OF 50MCG EVERY OTHER DAY 45 tablet 0   levothyroxine (SYNTHROID) 50 MCG tablet TAKE 1 TABLET BY MOUTH EVERY OTHER DAY ALTERNATING WITH 1 TABLET OF 25 MCG EVERY OTHER DAY 45 tablet 0   neomycin-bacitracin-polymyxin (NEOSPORIN) ointment Apply 1 application topically daily as needed for wound care.     nitroGLYCERIN (NITROSTAT) 0.4 MG SL tablet DISSOLVE ONE TABLET UNDER THE TONGUE EVERY 5 MINUTES AS NEEDED FOR CHEST PAIN.  DO NOT EXCEED A TOTAL OF 3  DOSES IN 15 MINUTES 25 tablet 2   rosuvastatin (CRESTOR) 10 MG tablet Take 1 tablet (10 mg total) by mouth daily. 90 tablet 3   sacubitril-valsartan (ENTRESTO) 97-103 MG Take 1 tablet by mouth 2 (two) times daily. 180 tablet 3   Tetrahydrozoline HCl (VISINE OP) Place 1 drop into both eyes daily as needed (irritation).     digoxin (LANOXIN) 0.125 MG tablet Take 1/2 (one-half) tablet by mouth once daily 45 tablet 4   eplerenone (INSPRA) 50 MG tablet Take 1 tablet (50 mg total) by mouth daily. 90 tablet 3   sodium zirconium cyclosilicate (LOKELMA) 10 g PACK packet DISSOLVE 1 PACKET IN WATER & DRINK ONCE DAILY 90 each 0   No current facility-administered medications for this encounter.    Allergies:   Xanax [alprazolam]   Social History:  The patient  reports that he has never smoked. He has never used smokeless tobacco. He reports that he does not drink alcohol and does not use drugs.   Family History:  The patient's family history includes Heart attack in his father and mother; Heart disease in his father, maternal grandmother, and mother; Hypertension in his father and sister.   ROS:  Please see the history of present illness.   All other systems are personally reviewed and negative.   Exam:   BP 112/70   Pulse 67   Wt 86.9 kg (191 lb 9.6 oz)   SpO2 97%   BMI 25.99 kg/m   General: NAD Neck: No JVD, no thyromegaly or thyroid nodule.  Lungs: Clear to auscultation bilaterally with normal respiratory effort. CV: Nondisplaced PMI.  Heart regular S1/S2, no S3/S4, no murmur.  No peripheral edema.  No carotid bruit.  Normal pedal pulses.  Abdomen: Soft, nontender, no hepatosplenomegaly, no distention.  Skin: Intact without lesions or rashes.  Neurologic: Alert and oriented x 3.  Psych: Normal affect. Extremities: No clubbing or cyanosis.  HEENT: Large lipoma right upper neck, stable.   Recent Labs: 01/26/2022: TSH 2.927 05/31/2022: B Natriuretic Peptide 3,674.3; BUN 28; Creatinine, Ser  1.49; Potassium 4.1; Sodium 142  Personally reviewed   Wt Readings from Last 3 Encounters:  05/31/22 86.9 kg (191 lb 9.6 oz)  04/24/22 90 kg (198 lb  6.4 oz)  02/01/22 90.4 kg (199 lb 6.4 oz)    ASSESSMENT AND PLAN:  1. Chronic systolic CHF: Ischemic cardiomyopathy.  TEE 10/17 with EF 20-25%.  Echo in 3/21 shoed EF 20-25%, normal RV function, normal bioprosthetic mitral valve.  CPX in 3/21 showed severe functional impairment due to HF with concern for poor short-term prognosis.  RHC in 4/21 showed normal filling pressures but low cardiac index.  I talked to him about advanced therapies given poor prognosis with low cardiac output. He is not a transplant candidate.  He has had 2 prior sternotomies, which makes LVAD more complicated.  He talked with Dr Orvan Seen about LVAD approach from left lateral thoracotomy.  We have discussed LVAD extensively.  He is very clear that he does not want another cardiac surgery. Symptoms somewhat better recently, NYHA class II. He is not volume overloaded by exam or Optivol.  - Continue to use Lasix prn.    - Continue Entresto 97/103 bid.  - Gynecomastia with spironolactone.  Increase eplerenone to 50 mg daily (goal dose).  BMET today and again in 10 days.  - Continue Coreg 18.75 mg bid.  He did not tolerate increasing Coreg to 25 mg bid.       - He has been on Lokelma chronically to control hyperkalemia and allow use of eplerenone and Entresto, continue  - Continue Farxiga 10 mg daily.    - Continue digoxin 0.0625 daily.  Check digoxin level.  - As he has turned down LVAD, we have discussed baroreceptor activation therapy in the past to help with his symptoms. His right neck lipoma poses a problem for vagal lead placement, however. He would need the lipoma removed.   - I am concerned about his low effective BiV pacing, suspect related to PVCs. I will place 7 day Zio to quantify PVCs, would consider use of mexiletine if > 10% burden.  2. CAD: s/p redo CABG.  Admission  in 3/18 with NSTEMI, LHC showed occlusion of SVG-PDA from CABG#1 but patent SVG-PDA from CABG#2, no intervention.  He had a post-op NSTEMI after inguinal hernia repair in 11/18.  LHC showed occlusion of a PLV branch that had been backfilled by SVG-PDA (prior cath had shown severe diffuse disease in the PLV). Minimal recent chest pain, atypical (related to emotional upset).      - Continue statin => Crestor 10 mg daily. Check lipids today.      - Continue ASA 81. 3. Bioprosthetic mitral valve: Stable on 8/23 echo.   4. Atrial flutter: s/p ablation in 8/18.  He is in NSR and off amiodarone.   - Now off warfarin with flutter ablation and no recurrence. If he has recurrence of atrial arrhythmias, based on most recent data DOAC would be a reasonable choice for him even with bioprosthetic MV.  5. CKD: Stage III.  BMET.   6. Type II diabetes: Followed by endocrinology. 7. CVA: In 1/21, got tPA at the ER in [place].  Symptoms resolved.  CTA head/neck showed no carotid stenosis.  Device interrogation has shown no atrial fibrillation. If atrial fibrillation is noted on future monitoring, will need anticoagulation.  - Continue ASA and Plavix 75 daily.  - Continue Crestor.  8. PVCs: Effective BiV pacing 87% on device check today.  Suspect this is low due to PVCs.  As above, place Zio monitor and consider mexiletine if > 10% PVCs.  9. LUL lung paramediastinal mass: Noted on recent CT.   - Repeat CT recommended in  3 months to follow (hopefully benign).   Followup in 4 months  Signed, Loralie Champagne, MD  06/01/2022  [place] [address] [facility] and [place] [address] [phone] (office) [phone] (fax)

## 2022-06-09 ENCOUNTER — Encounter: Payer: Self-pay | Admitting: Cardiology

## 2022-06-13 ENCOUNTER — Other Ambulatory Visit (HOSPITAL_COMMUNITY): Payer: Self-pay | Admitting: Cardiology

## 2022-06-14 LAB — BASIC METABOLIC PANEL
BUN/Creatinine Ratio: 20 (ref 10–24)
BUN: 33 mg/dL — ABNORMAL HIGH (ref 8–27)
CO2: 23 mmol/L (ref 20–29)
Calcium: 9.4 mg/dL (ref 8.6–10.2)
Chloride: 101 mmol/L (ref 96–106)
Creatinine, Ser: 1.67 mg/dL — ABNORMAL HIGH (ref 0.76–1.27)
Glucose: 211 mg/dL — ABNORMAL HIGH (ref 70–99)
Potassium: 4.4 mmol/L (ref 3.5–5.2)
Sodium: 140 mmol/L (ref 134–144)
eGFR: 43 mL/min/{1.73_m2} — ABNORMAL LOW (ref >59)

## 2022-06-14 LAB — SPECIMEN STATUS REPORT

## 2022-07-03 ENCOUNTER — Ambulatory Visit (INDEPENDENT_AMBULATORY_CARE_PROVIDER_SITE_OTHER): Payer: Medicare Other

## 2022-07-03 ENCOUNTER — Telehealth: Payer: Self-pay

## 2022-07-03 ENCOUNTER — Encounter (HOSPITAL_COMMUNITY): Payer: Self-pay | Admitting: Cardiology

## 2022-07-03 DIAGNOSIS — Z95810 Presence of automatic (implantable) cardiac defibrillator: Secondary | ICD-10-CM

## 2022-07-03 DIAGNOSIS — I5022 Chronic systolic (congestive) heart failure: Secondary | ICD-10-CM | POA: Diagnosis not present

## 2022-07-03 NOTE — Telephone Encounter (Signed)
Spoke with wife, [name] per [place].  Advised automatic report was not received today.  She stated was not by the monitor last night.  Requested to send manual remote transmission later today for fluid level review and she agreed. Advised will review transmission and call back with results.

## 2022-07-04 ENCOUNTER — Other Ambulatory Visit (HOSPITAL_COMMUNITY): Payer: Self-pay | Admitting: *Deleted

## 2022-07-04 MED ORDER — LOKELMA 10 G PO PACK: PACK | ORAL | 0 refills | Status: DC

## 2022-07-04 NOTE — Progress Notes (Signed)
EPIC Encounter for ICM Monitoring  Patient Name: Christian Bowman is a 76 y.o. male Date: 07/04/2022 Primary Care Physican: Sharilyn Sites, MD Primary Cardiologist: Croitoru/[place] Electrophysiologist: Curt Bears Bi-V Pacing: 94%            05/28/2022 Weight: 188.6 lbs 05/29/2022 Weight: 186.1 lbs  07/04/2022 Weight: 183-185 lbs                                                            Spoke with wife, per DPR.  Pt is feeing well and took approximately 3 PRN Furosemide in the last month when he felt like he had some fluid symptoms.    Diet: Wife records sodium intake by reviewing food labels.     Optivol thoracic impedance suggesting normal fluid levels.    Prescribed:  Furosemide 20 mg Take 1 tablet (20 mg total) by mouth daily as needed (for a weight over 190 lbs).   Recommendations: No changes and encouraged to call if experiencing any fluid symptoms.   Follow-up plan: ICM clinic phone appointment on 08/14/2022.   91 day device clinic remote transmission 07/26/2023.     EP/Cardiology Office Visits:  Recall 09/28/2022 with Dr. Aundra Dubin.   08/28/2022 with Dr Sallyanne Kuster.  Recall 07/20/2022 with Dr Curt Bears.   Copy of ICM check sent to Dr. Curt Bears.    3 month ICM trend: 07/03/2022.    12-14 Month ICM trend:     Rosalene Billings, RN 07/04/2022 11:56 AM

## 2022-07-05 ENCOUNTER — Ambulatory Visit
Admission: RE | Admit: 2022-07-05 | Discharge: 2022-07-05 | Disposition: A | Discharge status: Home / Self Care | Payer: Medicare Other | Source: Ambulatory Visit | Attending: Cardiovascular Disease | Admitting: Cardiovascular Disease

## 2022-07-05 DIAGNOSIS — R918 Other nonspecific abnormal finding of lung field: Secondary | ICD-10-CM

## 2022-07-05 MED ORDER — IOPAMIDOL (ISOVUE-300) INJECTION 61%
60.0000 mL | Freq: Once | INTRAVENOUS | Status: AC | PRN
Start: 2022-07-05 — End: 2022-07-05
  Administered 2022-07-05: 60 mL via INTRAVENOUS

## 2022-07-07 ENCOUNTER — Telehealth: Payer: Self-pay | Admitting: Cardiology

## 2022-07-07 NOTE — Telephone Encounter (Signed)
Caller is reporting CT Chest Contrast results.

## 2022-07-07 NOTE — Telephone Encounter (Signed)
Received call from [place] with [facility] regarding Chest CT. Results in Epic.

## 2022-07-07 NOTE — Telephone Encounter (Signed)
Aware of it. Thanks

## 2022-07-15 ENCOUNTER — Other Ambulatory Visit (HOSPITAL_COMMUNITY): Payer: Self-pay | Admitting: Cardiology

## 2022-07-15 ENCOUNTER — Other Ambulatory Visit: Payer: Self-pay | Admitting: "Endocrinology

## 2022-07-21 ENCOUNTER — Telehealth (HOSPITAL_COMMUNITY): Payer: Self-pay

## 2022-07-21 NOTE — Telephone Encounter (Addendum)
Spoke with his wife. He is not willing to start the medication.  ----- Message from Larey Dresser, MD sent at 06/16/2022 11:48 AM EDT ----- Please let Christian Bowman know that he has a moderate amount of PVCs (8.5%).  Suspect these are decreasing his BiV pacing percentage.  I spoke with Dr. Sallyanne Kuster and we agree that a trial of mexiletine 150 mg bid would be reasonable to try to suppress PVCs and increase BiV pacing and potentially improve heart function.  Would call this in for him if he is willing to try.  Side effects from mexiletine are generally pretty minimal.

## 2022-07-25 ENCOUNTER — Ambulatory Visit (INDEPENDENT_AMBULATORY_CARE_PROVIDER_SITE_OTHER): Payer: Medicare Other

## 2022-07-25 DIAGNOSIS — I5022 Chronic systolic (congestive) heart failure: Secondary | ICD-10-CM | POA: Diagnosis not present

## 2022-07-27 LAB — CUP PACEART REMOTE DEVICE CHECK
Battery Remaining Longevity: 13 mo
Battery Voltage: 2.87 V
Brady Statistic AP VP Percent: 68.24 %
Brady Statistic AP VS Percent: 0.61 %
Brady Statistic AS VP Percent: 30.44 %
Brady Statistic AS VS Percent: 0.71 %
Brady Statistic RA Percent Paced: 67.02 %
Brady Statistic RV Percent Paced: 95.58 %
Date Time Interrogation Session: 20231025003424
HighPow Impedance: 59 Ohm
Implantable Lead Connection Status: 753985
Implantable Lead Connection Status: 753985
Implantable Lead Connection Status: 753985
Implantable Lead Implant Date: 20171024
Implantable Lead Implant Date: 20171024
Implantable Lead Implant Date: 20171024
Implantable Lead Location: 753858
Implantable Lead Location: 753859
Implantable Lead Location: 753860
Implantable Lead Model: 4598
Implantable Lead Model: 5076
Implantable Pulse Generator Implant Date: 20171024
Lead Channel Impedance Value: 230.327
Lead Channel Impedance Value: 230.327
Lead Channel Impedance Value: 230.327
Lead Channel Impedance Value: 256.5 Ohm
Lead Channel Impedance Value: 256.5 Ohm
Lead Channel Impedance Value: 342 Ohm
Lead Channel Impedance Value: 399 Ohm
Lead Channel Impedance Value: 418 Ohm
Lead Channel Impedance Value: 418 Ohm
Lead Channel Impedance Value: 513 Ohm
Lead Channel Impedance Value: 513 Ohm
Lead Channel Impedance Value: 513 Ohm
Lead Channel Impedance Value: 646 Ohm
Lead Channel Impedance Value: 817 Ohm
Lead Channel Impedance Value: 817 Ohm
Lead Channel Impedance Value: 836 Ohm
Lead Channel Impedance Value: 874 Ohm
Lead Channel Impedance Value: 893 Ohm
Lead Channel Pacing Threshold Amplitude: 0.5 V
Lead Channel Pacing Threshold Amplitude: 1 V
Lead Channel Pacing Threshold Amplitude: 1.25 V
Lead Channel Pacing Threshold Pulse Width: 0.4 ms
Lead Channel Pacing Threshold Pulse Width: 0.4 ms
Lead Channel Pacing Threshold Pulse Width: 1 ms
Lead Channel Sensing Intrinsic Amplitude: 1 mV
Lead Channel Sensing Intrinsic Amplitude: 1 mV
Lead Channel Sensing Intrinsic Amplitude: 13.75 mV
Lead Channel Sensing Intrinsic Amplitude: 13.75 mV
Lead Channel Setting Pacing Amplitude: 1.75 V
Lead Channel Setting Pacing Amplitude: 2 V
Lead Channel Setting Pacing Amplitude: 2 V
Lead Channel Setting Pacing Pulse Width: 0.4 ms
Lead Channel Setting Pacing Pulse Width: 1 ms
Lead Channel Setting Sensing Sensitivity: 0.3 mV
Zone Setting Status: 755011
Zone Setting Status: 755011

## 2022-08-02 ENCOUNTER — Ambulatory Visit: Payer: Medicare Other | Admitting: "Endocrinology

## 2022-08-02 LAB — COMPREHENSIVE METABOLIC PANEL
ALT: 16 IU/L (ref 0–44)
AST: 13 IU/L (ref 0–40)
Albumin/Globulin Ratio: 1.5 (ref 1.2–2.2)
Albumin: 4.1 g/dL (ref 3.8–4.8)
Alkaline Phosphatase: 72 IU/L (ref 44–121)
BUN/Creatinine Ratio: 20 (ref 10–24)
BUN: 28 mg/dL — ABNORMAL HIGH (ref 8–27)
Bilirubin Total: 0.7 mg/dL (ref 0.0–1.2)
CO2: 21 mmol/L (ref 20–29)
Calcium: 9.4 mg/dL (ref 8.6–10.2)
Chloride: 103 mmol/L (ref 96–106)
Creatinine, Ser: 1.43 mg/dL — ABNORMAL HIGH (ref 0.76–1.27)
Globulin, Total: 2.8 g/dL (ref 1.5–4.5)
Glucose: 135 mg/dL — ABNORMAL HIGH (ref 70–99)
Potassium: 4.5 mmol/L (ref 3.5–5.2)
Sodium: 140 mmol/L (ref 134–144)
Total Protein: 6.9 g/dL (ref 6.0–8.5)
eGFR: 51 mL/min/{1.73_m2} — ABNORMAL LOW (ref >59)

## 2022-08-02 LAB — TSH: TSH: 4.08 u[IU]/mL (ref 0.450–4.500)

## 2022-08-02 LAB — T4, FREE: Free T4: 1.38 ng/dL (ref 0.82–1.77)

## 2022-08-04 NOTE — Addendum Note (Signed)
Encounter addended by: Campbell Riches on: 08/04/2022 10:59 AM  Actions taken: Imaging Exam ended

## 2022-08-09 ENCOUNTER — Ambulatory Visit (INDEPENDENT_AMBULATORY_CARE_PROVIDER_SITE_OTHER): Payer: Medicare Other | Admitting: "Endocrinology

## 2022-08-09 ENCOUNTER — Encounter: Payer: Self-pay | Admitting: "Endocrinology

## 2022-08-09 VITALS — BP 122/58 | HR 68 | Ht 72.0 in | Wt 189.2 lb

## 2022-08-09 DIAGNOSIS — I255 Ischemic cardiomyopathy: Secondary | ICD-10-CM

## 2022-08-09 DIAGNOSIS — E1122 Type 2 diabetes mellitus with diabetic chronic kidney disease: Secondary | ICD-10-CM | POA: Diagnosis not present

## 2022-08-09 DIAGNOSIS — N1831 Chronic kidney disease, stage 3a: Secondary | ICD-10-CM

## 2022-08-09 DIAGNOSIS — I10 Essential (primary) hypertension: Secondary | ICD-10-CM

## 2022-08-09 DIAGNOSIS — E039 Hypothyroidism, unspecified: Secondary | ICD-10-CM

## 2022-08-09 DIAGNOSIS — E782 Mixed hyperlipidemia: Secondary | ICD-10-CM | POA: Diagnosis not present

## 2022-08-09 LAB — POCT GLYCOSYLATED HEMOGLOBIN (HGB A1C): HbA1c, POC (controlled diabetic range): 7.2 % — AB (ref 0.0–7.0)

## 2022-08-09 NOTE — Progress Notes (Signed)
08/09/2022          Endocrinology follow-up note   Subjective:    Patient ID: Christian Bowman, male    DOB: 1946-09-07,    Past Medical History:  Diagnosis Date   AICD (automatic cardioverter/defibrillator) present    medtronic-   DR. CROITORU , DR. Aundra Dubin    Anginal pain ([place])    cp sat 08/11/17   Anxiety    CAD (coronary artery disease)    CHF (congestive heart failure) ([place])    Complication of anesthesia    took awhile to wake up    Coronary artery disease involving coronary bypass graft    Cyst of neck    right side   DM2 (diabetes mellitus, type 2) ([place]) 08/26/2013   Dyspnea    Heart attack ([place])    "not sure when" (08/20/2017)   HTN (hypertension) 08/26/2013   Hyperlipidemia 08/26/2013   Hypothyroidism    Left main coronary artery disease    Left renal artery stenosis ([place])    Genesis 6x12 stent 2007   Obesity (BMI 30.0-34.9) 08/26/2013   Postoperative atrial fibrillation ([place]) 10/15/2005   Presence of permanent cardiac pacemaker    S/P CABG x 4 10/13/2005   LIMA to LAD, SVG to intermediate branch, sequential SVG to PDA and RPL branch, EVH via right thigh   S/P mitral valve replacement with bioprosthetic valve 07/11/2016   31 mm [facility] Mitral bovine bioprosthetic tissue valve   S/P redo CABG x 2 07/11/2016   SVG to PDA and SVG to Intermediate Branch, EVH via left thigh   Past Surgical History:  Procedure Laterality Date   A-FLUTTER ABLATION N/A 05/11/2017   Procedure: A-Flutter Ablation;  Surgeon: Constance Haw, MD;  Location: [place] [facility];  Service: Cardiovascular;  Laterality: N/A;   CARDIAC CATHETERIZATION N/A 06/21/2016   Procedure: Right/Left Heart Cath and Coronary/Graft Angiography;  Surgeon: Sherren Mocha, MD;  Location: [place] [facility];  Service: Cardiovascular;  Laterality: N/A;   CARDIAC VALVE REPLACEMENT     CARDIOVERSION N/A 07/19/2016   Procedure: CARDIOVERSION;  Surgeon: Lelon Perla, MD;  Location: [facility] ENDOSCOPY;  Service:  Cardiovascular;  Laterality: N/A;   CARDIOVERSION N/A 09/08/2016   Procedure: CARDIOVERSION;  Surgeon: Fay Records, MD;  Location: [facility] ENDOSCOPY;  Service: Cardiovascular;  Laterality: N/A;   CORONARY ANGIOPLASTY     STENT 2016  [facility]   CORONARY ANGIOPLASTY WITH STENT PLACEMENT     DES in SVG to right coronary artery system   CORONARY ARTERY BYPASS GRAFT  10/13/2005   LIMA to LAD, SVG to intermediate branch, sequential SVG to PDA and RPL   CORONARY ARTERY BYPASS GRAFT N/A 07/11/2016   Procedure: REDO CORONARY ARTERY BYPASS GRAFTING (CABG) x two using left leg greater saphenous vein harvested endoscopically-SVG to PDA -SVG to RAMUS INTERMEDIATE;  Surgeon: Rexene Alberts, MD;  Location: [place];  Service: Open Heart Surgery;  Laterality: N/A;   CORONARY ARTERY BYPASS GRAFT N/A 07/11/2016   Procedure: Re-exploration (CABG) for post op bleeding,;  Surgeon: Rexene Alberts, MD;  Location: [place];  Service: Open Heart Surgery;  Laterality: N/A;   EP IMPLANTABLE DEVICE N/A 07/25/2016   Procedure: BiV ICD Insertion CRT-D;  Surgeon: Will Meredith Leeds, MD;  Location: [place] [facility];  Service: Cardiovascular;  Laterality: N/A;   INGUINAL HERNIA REPAIR Left 08/20/2017   INGUINAL HERNIA REPAIR Left 08/20/2017   Procedure: OPEN LEFT INGUINAL HERNIA REPAIR;  Surgeon: Alphonsa Overall, MD;  Location: [place];  Service: General;  Laterality: Left;   LEFT HEART CATH AND CORS/GRAFTS ANGIOGRAPHY N/A 12/18/2016   Procedure: Left Heart Cath and Cors/Grafts Angiography;  Surgeon: Sherren Mocha, MD;  Location: [place] [facility];  Service: Cardiovascular;  Laterality: N/A;   LEFT HEART CATH AND CORS/GRAFTS ANGIOGRAPHY N/A 08/22/2017   Procedure: LEFT HEART CATH AND CORS/GRAFTS ANGIOGRAPHY;  Surgeon: Larey Dresser, MD;  Location: [place] [facility];  Service: Cardiovascular;  Laterality: N/A;   MITRAL VALVE REPLACEMENT N/A 07/11/2016   Procedure: MITRAL VALVE (MV) REPLACEMENT;  Surgeon: Rexene Alberts,  MD;  Location: [place];  Service: Open Heart Surgery;  Laterality: N/A;   MYOCARDICAL PERFUSION  10/09/2007   NORMAL PERFUSION IN ALL REGIONS;NO EVIDENCE OF INDUCIBLE ISCHEMIA;POST STRESS EF% 66   RENAL ARTERY STENT Right 2007   RENAL DOPPLER  03/28/2010   RIGHT RA-NORMAL;LEFT PROXIMAL RA AT STENT-PATENT WITH NO EVIDENCE OF SIGN DIAMETER REDUCTION. R & L KIDNEYS: EQUAL IN SIZE,SYMMETRICAL IN SHAPE.   RIGHT HEART CATH N/A 01/08/2020   Procedure: RIGHT HEART CATH;  Surgeon: Larey Dresser, MD;  Location: [place] [facility];  Service: Cardiovascular;  Laterality: N/A;   TEE WITHOUT CARDIOVERSION N/A 06/15/2016   Procedure: TRANSESOPHAGEAL ECHOCARDIOGRAM (TEE);  Surgeon: Sanda Klein, MD;  Location: [facility] ENDOSCOPY;  Service: Cardiovascular;  Laterality: N/A;   TEE WITHOUT CARDIOVERSION N/A 07/11/2016   Procedure: TRANSESOPHAGEAL ECHOCARDIOGRAM (TEE);  Surgeon: Rexene Alberts, MD;  Location: [place];  Service: Open Heart Surgery;  Laterality: N/A;   TEE WITHOUT CARDIOVERSION N/A 07/19/2016   Procedure: TRANSESOPHAGEAL ECHOCARDIOGRAM (TEE);  Surgeon: Lelon Perla, MD;  Location: [facility] ENDOSCOPY;  Service: Cardiovascular;  Laterality: N/A;   TRANSESOPHAGEAL ECHOCARDIOGRAM  10/19/2005   NORMAL LV; MILD TO MODERATE AMOUNT OF SOFT ATHEROMATOUS PLAQUE OF THE THORACIC AORTA; THE LEFT ATRIUM IS MILDLY DILATED;LEFT ATRIAL APPENDAGE FUNCTION IS NORMAL;NO THROMBUS IDENTIFIED. SMALL PFO WITH RIGHT TO LEFT SHUNT   Social History   Socioeconomic History   Marital status: Married    Spouse name: Not on file   Number of children: 0   Years of education: 18   Highest education level: Not on file  Occupational History   Occupation: retired  Tobacco Use   Smoking status: Never   Smokeless tobacco: Never  Vaping Use   Vaping Use: Never used  Substance and Sexual Activity   Alcohol use: No   Drug use: No   Sexual activity: Never  Other Topics Concern   Not on file  Social History Narrative   Right handed    Two story home   Drinks half and half coffee   Social Determinants of Health   Financial Resource Strain: Not on file  Food Insecurity: Not on file  Transportation Needs: Not on file  Physical Activity: Not on file  Stress: Not on file  Social Connections: Not on file   Outpatient Encounter Medications as of 08/09/2022  Medication Sig   amoxicillin (AMOXIL) 500 MG tablet Take 4 tablets (2,000 mg total) by mouth See admin instructions. Take 2000 mg 1 hour prior to dental work   aspirin EC 81 MG tablet Take 81 mg by mouth daily.    Blood Glucose Monitoring Suppl (ACCU-CHEK GUIDE ME) w/Device KIT 1 Piece by Does not apply route as directed.   carvedilol (COREG) 12.5 MG tablet TAKE 1 & 1/2 (ONE & ONE-HALF) TABLETS BY MOUTH TWICE DAILY WITH A MEAL   cholecalciferol (VITAMIN D3) 25 MCG (1000 UNIT) tablet  Take 2,000 Units by mouth daily with breakfast.   clopidogrel (PLAVIX) 75 MG tablet Take 1 tablet by mouth once daily   dapagliflozin propanediol (FARXIGA) 10 MG TABS tablet TAKE 1 TABLET BY MOUTH ONCE DAILY BEFORE BREAKFAST **DICOUNTINE TRADJENTA**   digoxin (LANOXIN) 0.125 MG tablet Take 1/2 (one-half) tablet by mouth once daily   eplerenone (INSPRA) 50 MG tablet Take 1 tablet (50 mg total) by mouth daily.   furosemide (LASIX) 20 MG tablet Take 1 tablet (20 mg total) by mouth daily as needed (for a weight over 190 lbs).   glucose blood (ACCU-CHEK GUIDE) test strip Use  to monitor glucose once a day at fasting.   levothyroxine (SYNTHROID) 25 MCG tablet TAKE 1 TABLET BY MOUTH EVERY OTHER DAY BEFORE BREAKFAST ALTERNATING WITH 50MCG TABLET   levothyroxine (SYNTHROID) 50 MCG tablet TAKE 1 TABLET BY MOUTH EVERY OTHER DAY ALTERNATING WITH 1 TABLET OF 25 MCG TABLET   neomycin-bacitracin-polymyxin (NEOSPORIN) ointment Apply 1 application topically daily as needed for wound care.   nitroGLYCERIN (NITROSTAT) 0.4 MG SL tablet DISSOLVE ONE TABLET UNDER THE TONGUE EVERY 5 MINUTES AS NEEDED FOR CHEST PAIN.   DO NOT EXCEED A TOTAL OF 3 DOSES IN 15 MINUTES   rosuvastatin (CRESTOR) 10 MG tablet Take 1 tablet (10 mg total) by mouth daily.   sacubitril-valsartan (ENTRESTO) 97-103 MG Take 1 tablet by mouth 2 (two) times daily.   sodium zirconium cyclosilicate (LOKELMA) 10 g PACK packet DISSOLVE 1 PACKET IN WATER & DRINK ONCE DAILY   Tetrahydrozoline HCl (VISINE OP) Place 1 drop into both eyes daily as needed (irritation).   No facility-administered encounter medications on file as of 08/09/2022.   ALLERGIES: Allergies  Allergen Reactions   Xanax [Alprazolam] Other (See Comments)    Pt feels very weak, tired and feels paralyzed     VACCINATION STATUS: Immunization History  Administered Date(s) Administered   Fluad Quad(high Dose 65+) 07/28/2019   Influenza,inj,Quad PF,6+ Mos 08/01/2016, 07/11/2017   Pneumococcal Polysaccharide-23 07/02/2015    Diabetes He presents for his follow-up diabetic visit. He has type 2 diabetes mellitus. Onset time: He was diagnosed at approximate age of 33 years. His disease course has been improving. There are no hypoglycemic associated symptoms. Pertinent negatives for hypoglycemia include no confusion, headaches, pallor or seizures. There are no diabetic associated symptoms. Pertinent negatives for diabetes include no chest pain, no fatigue, no polydipsia, no polyphagia, no polyuria and no weakness. There are no hypoglycemic complications. Symptoms are improving. Diabetic complications include heart disease. Risk factors for coronary artery disease include diabetes mellitus, dyslipidemia, hypertension, male sex and sedentary lifestyle. He is compliant with treatment most of the time. His weight is decreasing steadily. He participates in exercise intermittently. His home blood glucose trend is decreasing steadily. (His point-of-care A1c is improving to 7.2% from 7.6%.  He did not document or report hypoglycemia.   ) An ACE inhibitor/angiotensin II receptor blocker is being  taken.  Hyperlipidemia This is a chronic problem. The current episode started more than 1 year ago. Exacerbating diseases include diabetes. Pertinent negatives include no chest pain, myalgias or shortness of breath. Current antihyperlipidemic treatment includes statins. Risk factors for coronary artery disease include diabetes mellitus, dyslipidemia, hypertension and male sex.  Hypertension This is a chronic problem. The current episode started more than 1 year ago. Pertinent negatives include no chest pain, headaches, neck pain, palpitations or shortness of breath. Risk factors for coronary artery disease include diabetes mellitus and dyslipidemia. Hypertensive end-organ damage includes CAD/MI.  Identifiable causes of hypertension include a thyroid problem.  Thyroid Problem Presents for follow-up visit. Patient reports no constipation, diarrhea, fatigue or palpitations. (Remains on levothyroxine 75 mcg p.o. nightly.  He is compliant.  He has no new complaints today.) The symptoms have been stable. His past medical history is significant for diabetes and hyperlipidemia.    Review of systems  Constitutional: + Steady body weight 200 pounds,   Body mass index is 25.66 kg/m. , no fatigue, no subjective hyperthermia, no subjective hypothermia    Objective:    BP (!) 122/58   Pulse 68   Ht 6' (1.829 m)   Wt 189 lb 3.2 oz (85.8 kg)   BMI 25.66 kg/m   Wt Readings from Last 3 Encounters:  08/09/22 189 lb 3.2 oz (85.8 kg)  05/31/22 191 lb 9.6 oz (86.9 kg)  04/24/22 198 lb 6.4 oz (90 kg)     Physical Exam- Limited  Constitutional:  Body mass index is 25.66 kg/m. , not in acute distress, normal state of mind Eyes:  EOMI, no exophthalmos   Chemistry (most recent): Lab Results  Component Value Date   NA 140 08/01/2022   K 4.5 08/01/2022   CL 103 08/01/2022   CO2 21 08/01/2022   BUN 28 (H) 08/01/2022   CREATININE 1.43 (H) 08/01/2022     Lipid Panel     Component Value Date/Time    CHOL 129 05/31/2022 1142   CHOL 120 10/01/2018 0925   TRIG 81 05/31/2022 1142   HDL 37 (L) 05/31/2022 1142   HDL 31 (L) 10/01/2018 0925   CHOLHDL 3.5 05/31/2022 1142   VLDL 16 05/31/2022 1142   LDLCALC 76 05/31/2022 1142   LDLCALC 60 10/01/2018 0925   LABVLDL 29 10/01/2018 0925    A1c today August 11, 2020 was 6.3%.  Assessment & Plan:   1. Type 2 diabetes mellitus with other circulatory complication ([place]), stage 3 CKD   His diabetes is  complicated by coronary artery disease status post coronary artery bypass graft and recent ACS and patient remains at a high risk for more acute and chronic complications of diabetes which include CAD, CVA, CKD, retinopathy, and neuropathy. These are all discussed in detail with the patient.  His point-of-care A1c is improving to 7.2% from 7.6%.  He did not document or report hypoglycemia.     His most recent CMP still consistent with renal insufficiency- does have history of stage 2-3 renal insufficiency. - Suggestion is made for him to avoid simple carbohydrates  from his diet including Cakes, Sweet Desserts, Ice Cream, Soda (diet and regular), Sweet Tea, Candies, Chips, Cookies, Store Bought Juices, Alcohol in Excess of  1-2 drinks a day, Artificial Sweeteners,  Coffee Creamer, and "Sugar-free" Products, Lemonade. This will help patient to have more stable blood glucose profile and potentially avoid unintended weight gain.  - Patient is advised to stick to a routine mealtimes to eat 3 meals  a day and avoid unnecessary snacks ( to snack only to correct hypoglycemia).   - I have approached patient with the following individualized plan to manage diabetes and patient agrees.  -He will not need additional intervention for diabetes, advised to continue Farxiga 10 mg p.o. daily at breakfast.   If he returns with worsening A1c, he will be considered for resumption of Tradjenta.   - Patient specific target  for A1c; LDL, HDL, Triglycerides, were  discussed in detail.  2) BP/HTN -His blood pressure is controlled to target.  He  is currently on carvedilol 12.5 mg p.o. twice daily, eplerenone 50 mg p.o. daily, as well as Entresto 97-103 mg p.o. daily.   He could be considered for low-dose of Entresto.  3) Lipids/HPL: His most recent lipid panel showed LDL of 76.  He is advised to continue Crestor 10 mg p.o. nightly.    Side effects and precautions discussed with him.    4)  Weight/Diet: His BMI is 27.04-  -a candidate for some weight loss.  CDE consult in progress, exercise, and carbohydrates information provided.  5) hypothyroidism: - This is related to his therapy with amiodarone. His previsit thyroid function tests are consistent with appropriate replacement.  He is advised to continue  thyroid hormone with levothyroxine 25 mcg and 50 mcg on alternate days.     - We discussed about the correct intake of his thyroid hormone, on empty stomach at fasting, with water, separated by at least 30 minutes from breakfast and other medications,  and separated by more than 4 hours from calcium, iron, multivitamins, acid reflux medications (PPIs). -Patient is made aware of the fact that thyroid hormone replacement is needed for life, dose to be adjusted by periodic monitoring of thyroid function tests.   6) vitamin D deficiency: He has responded to vitamin D supplement with correction of his 25-hydroxy vitamin D to 40.  He is advised to continue vitamin D3 2000 units daily.   7) Chronic Care/Health Maintenance:  -Patient is  on ACEI/ARB and Statin medications and encouraged to continue to follow up with Ophthalmology, Podiatrist at least yearly or according to recommendations, and advised to  stay away from smoking. I have recommended yearly flu vaccine and pneumonia vaccination at least every 5 years, and  sleep for at least 7 hours a day.  This patient may benefit from screening bone density.  I advised patient to maintain close follow up with  his cardiologist and his  PCP for primary care needs.   I spent 32 minutes in the care of the patient today including review of labs from Thyroid Function, CMP, and other relevant labs ; imaging/biopsy records (current and previous including abstractions from other facilities); face-to-face time discussing  his lab results and symptoms, medications doses, his options of short and long term treatment based on the latest standards of care / guidelines;   and documenting the encounter.  Christian Bowman  participated in the discussions, expressed understanding, and voiced agreement with the above plans.  All questions were answered to his satisfaction. he is encouraged to contact clinic should he have any questions or concerns prior to his return visit.    Follow up plan: Return for F/U with Pre-visit Labs, A1c -NV.  Glade Lloyd, MD Phone: [phone]  Fax: [phone]   This note was partially dictated with voice recognition software. Similar sounding words can be transcribed inadequately or may not  be corrected upon review.  08/09/2022, 12:40 PM

## 2022-08-14 ENCOUNTER — Ambulatory Visit (INDEPENDENT_AMBULATORY_CARE_PROVIDER_SITE_OTHER): Payer: Medicare Other

## 2022-08-14 DIAGNOSIS — I5022 Chronic systolic (congestive) heart failure: Secondary | ICD-10-CM | POA: Diagnosis not present

## 2022-08-14 DIAGNOSIS — Z95810 Presence of automatic (implantable) cardiac defibrillator: Secondary | ICD-10-CM | POA: Diagnosis not present

## 2022-08-14 NOTE — Progress Notes (Signed)
Remote ICD transmission.   

## 2022-08-16 NOTE — Progress Notes (Signed)
EPIC Encounter for ICM Monitoring  Patient Name: Christian Bowman is a 76 y.o. male Date: 08/16/2022 Primary Care Physican: Sharilyn Sites, MD Primary Cardiologist: [facility] Electrophysiologist: Curt Bears Bi-V Pacing: 95.6%            05/28/2022 Weight: 188.6 lbs 05/29/2022 Weight: 186.1 lbs  07/04/2022 Weight: 183-185 lbs 08/16/2022 Weight: 185 lbs                                                          Spoke with wife, per DPR.  Pt had weight gain last week and took PRN Furosemide and taken a couple times since last ICM call.  Pt weighs daily.     Diet: Wife records sodium intake by reviewing food labels.     Optivol thoracic impedance suggesting normal fluid levels.    Prescribed:  Furosemide 20 mg Take 1 tablet (20 mg total) by mouth daily as needed (for a weight over 190 lbs).   Recommendations: No changes and encouraged to call if experiencing any fluid symptoms.   Follow-up plan: ICM clinic phone appointment on 09/18/2022.   91 day device clinic remote transmission 10/24/2022.     EP/Cardiology Office Visits:  Recall 09/28/2022 with Dr. Aundra Dubin.   08/28/2022 with Dr Sallyanne Kuster. 10/17/2022 with Dr Curt Bears.   Copy of ICM check sent to Dr. Curt Bears.    3 month ICM trend: 08/16/2022.    12-14 Month ICM trend:     Rosalene Billings, RN 08/16/2022 4:01 PM

## 2022-08-28 ENCOUNTER — Encounter: Payer: Self-pay | Admitting: Cardiovascular Disease

## 2022-08-28 ENCOUNTER — Ambulatory Visit: Payer: Medicare Other | Attending: Cardiovascular Disease | Admitting: Cardiovascular Disease

## 2022-08-28 VITALS — BP 132/76 | HR 67 | Ht 72.0 in | Wt 190.6 lb

## 2022-08-28 DIAGNOSIS — K118 Other diseases of salivary glands: Secondary | ICD-10-CM | POA: Diagnosis present

## 2022-08-28 DIAGNOSIS — I10 Essential (primary) hypertension: Secondary | ICD-10-CM | POA: Diagnosis present

## 2022-08-28 DIAGNOSIS — E785 Hyperlipidemia, unspecified: Secondary | ICD-10-CM | POA: Insufficient documentation

## 2022-08-28 DIAGNOSIS — I5042 Chronic combined systolic (congestive) and diastolic (congestive) heart failure: Secondary | ICD-10-CM | POA: Diagnosis not present

## 2022-08-28 DIAGNOSIS — R918 Other nonspecific abnormal finding of lung field: Secondary | ICD-10-CM | POA: Insufficient documentation

## 2022-08-28 DIAGNOSIS — Z8679 Personal history of other diseases of the circulatory system: Secondary | ICD-10-CM | POA: Insufficient documentation

## 2022-08-28 DIAGNOSIS — Z95810 Presence of automatic (implantable) cardiac defibrillator: Secondary | ICD-10-CM | POA: Diagnosis present

## 2022-08-28 DIAGNOSIS — I25118 Atherosclerotic heart disease of native coronary artery with other forms of angina pectoris: Secondary | ICD-10-CM | POA: Diagnosis not present

## 2022-08-28 DIAGNOSIS — Z953 Presence of xenogenic heart valve: Secondary | ICD-10-CM | POA: Diagnosis not present

## 2022-08-28 DIAGNOSIS — I701 Atherosclerosis of renal artery: Secondary | ICD-10-CM | POA: Diagnosis present

## 2022-08-28 DIAGNOSIS — N1832 Chronic kidney disease, stage 3b: Secondary | ICD-10-CM | POA: Insufficient documentation

## 2022-08-28 DIAGNOSIS — I493 Ventricular premature depolarization: Secondary | ICD-10-CM | POA: Diagnosis present

## 2022-08-28 DIAGNOSIS — I255 Ischemic cardiomyopathy: Secondary | ICD-10-CM

## 2022-08-28 DIAGNOSIS — E1122 Type 2 diabetes mellitus with diabetic chronic kidney disease: Secondary | ICD-10-CM | POA: Diagnosis present

## 2022-08-28 NOTE — Patient Instructions (Signed)
Medication Instructions:  Your physician recommends that you continue on your current medications as directed. Please refer to the Current Medication list given to you today.  *If you need a refill on your cardiac medications before your next appointment, please call your pharmacy*  Testing/Procedures: CT chest w/o contrast due in January at [facility] imaging --they will call to schedule this.  Follow-Up: At [facility], you and your health needs are our priority.  As part of our continuing mission to provide you with exceptional heart care, we have created designated Provider Care Teams.  These Care Teams include your primary Cardiologist (physician) and Advanced Practice Providers (APPs -  Physician Assistants and Nurse Practitioners) who all work together to provide you with the care you need, when you need it.  We recommend signing up for the patient portal called "MyChart".  Sign up information is provided on this After Visit Summary.  MyChart is used to connect with patients for Virtual Visits (Telemedicine).  Patients are able to view lab/test results, encounter notes, upcoming appointments, etc.  Non-urgent messages can be sent to your provider as well.   To learn more about what you can do with MyChart, go to [email or web address].    Your next appointment:   August with Dr. Sallyanne Kuster

## 2022-08-28 NOTE — [place] Notes (Signed)
Cardiology Office Note    Date:  09/03/2022   ID:  Christian Bowman, DOB [birth year], MRN [id number]  PCP:  Christian Sites, MD  Cardiologist:  Christian Champagne, MD; Christian Lai, MD; Christian Klein, MD   No chief complaint on file.    History of Present Illness:  Christian Bowman is a 76 y.o. male with extensive and severe cardiac problems.  He has severe cardiomyopathy with LVEF of 20-25% (most recent echo March 2021) due to both ischemic cardiomyopathy and valvular heart disease.  He initially had a CABG in 2007, redo CABG in October 2017 when he also received a mitral valve bioprosthesis (Edwards Magna 31 mm, implanted 2017).  He has a dual-chamber biventricular defibrillator (Medtronic) also implanted in 2017.  He has a history of atrial flutter with successful ablation and has been off anticoagulation. In January 2021 he had transient right hand weakness and dysarthria and received thrombolytics at the emergency room in [place].  His device did not show atrial arrhythmia and echocardiogram with contrast did not show LV thrombus.  He was discharged on clopidogrel.  For the first time in about 2 years Christian Bowman required brief hospitalization for heart failure exacerbation in [place] on April 17, 2022.   Serial troponin assays were normal and his ECG was nondiagnostic due to biventricular pacing.   He underwent a CT angiogram of the chest for pulmonary embolism protocol that incidentally showed a 17 mm paramediastinal mass. The follow up CT 07/06/2022 shows it has enlarged to 24x12 mm ("Irregular hyperdense 2.4 x 1.2 cm subpleural nodule in the anteromedial left upper lobe (series 7/image 76), new from 07/07/2016 chest CT, abutting the left internal mammary arterial bypass graft.")  Since his last appointment he has been okay.  He has not had further heart failure exacerbation.  He does not have orthopnea, PND or lower extremity edema and has not experienced any chest pain, palpitations, dizziness or  syncope.  Device interrogation shows 30% atrial pacing and 95% biventricular pacing (up from 87% at the previous download) without any evidence of atrial fibrillation or ventricular tachycardia.  His OptiVol, which have deviated in July, has stayed normal.  Generator longevity is estimated to be about 1 year.  He is on maximum tolerated doses of Entresto and carvedilol as well as eplerenone (gynecomastia on spironolactone) and furosemide.  He is taking an aspirin and clopidogrel and is on a highly effective statin.  Most recent LDL cholesterol was 71 but has a chronically low HDL at 34.  Hemoglobin A1c a couple of months ago was slightly worse than previously at 7.6%.  Dig level checked in April was 0.6.  His right submandibular mass continues to slowly increase in size.  It does not really bother him except for cosmetic reasons, but it did become an issue when a barostim device was considered (that was later abandoned).    He underwent redo coronary artery bypass 2 (SVG to PDA, SVG to ramus), as well as mitral valve replacement (bioprosthetic Edwards magna 31 mm) on 62/22/9798, complicated by AV block and atrial flutter requiring cardioversion. Estimated EF 20-25 % on his 07/18/16 TEE. A biventricular defibrillator (Medtronic Claria MRI CRT-D) was implanted on July 25, 2016. On December 18, 2016 he was hospitalized for small non-STEMI and was found to have interval occlusion of the old SVG to PDA, while the more recently placed SVG was still patent. LVEDP was quite low at only 3 mmHg during that catheterization. He had atrial flutter ablation in  August 2018 and is off anticoagulation. He had a NSTEMI in November 2018 after inguinal hernia repair surgery.  He understands the need for endocarditis prophylaxis with dental procedures or other medical procedures that might cause bacteremia and the indication for anticoagulation chronically.  He is still following up with Dr. Aundra Bowman and Dr. Curt Bowman .  His  endocrinologist is Dr. Dorris Bowman.    Past Medical History:  Diagnosis Date   AICD (automatic cardioverter/defibrillator) present    medtronic-   DR. Deveron Bowman , DR. Aundra Bowman    Anginal pain ([place])    cp sat 08/11/17   Anxiety    CAD (coronary artery disease)    CHF (congestive heart failure) ([place])    Complication of anesthesia    took awhile to wake up    Coronary artery disease involving coronary bypass graft    Cyst of neck    right side   DM2 (diabetes mellitus, type 2) ([place]) 08/26/2013   Dyspnea    Heart attack ([place])    "not sure when" (08/20/2017)   HTN (hypertension) 08/26/2013   Hyperlipidemia 08/26/2013   Hypothyroidism    Left main coronary artery disease    Left renal artery stenosis ([place])    Genesis 6x12 stent 2007   Obesity (BMI 30.0-34.9) 08/26/2013   Postoperative atrial fibrillation ([place]) 10/15/2005   Presence of permanent cardiac pacemaker    S/P CABG x 4 10/13/2005   LIMA to LAD, SVG to intermediate branch, sequential SVG to PDA and RPL branch, EVH via right thigh   S/P mitral valve replacement with bioprosthetic valve 07/11/2016   31 mm [facility] Mitral bovine bioprosthetic tissue valve   S/P redo CABG x 2 07/11/2016   SVG to PDA and SVG to Intermediate Branch, EVH via left thigh    Past Surgical History:  Procedure Laterality Date   A-FLUTTER ABLATION N/A 05/11/2017   Procedure: A-Flutter Ablation;  Surgeon: Christian Haw, MD;  Location: [place] [facility];  Service: Cardiovascular;  Laterality: N/A;   CARDIAC CATHETERIZATION N/A 06/21/2016   Procedure: Right/Left Heart Cath and Coronary/Graft Angiography;  Surgeon: Christian Mocha, MD;  Location: [place] [facility];  Service: Cardiovascular;  Laterality: N/A;   CARDIAC VALVE REPLACEMENT     CARDIOVERSION N/A 07/19/2016   Procedure: CARDIOVERSION;  Surgeon: Christian Perla, MD;  Location: [facility] ENDOSCOPY;  Service: Cardiovascular;  Laterality: N/A;   CARDIOVERSION N/A 09/08/2016   Procedure: CARDIOVERSION;   Surgeon: Christian Records, MD;  Location: [facility] ENDOSCOPY;  Service: Cardiovascular;  Laterality: N/A;   CORONARY ANGIOPLASTY     STENT 2016  [facility]   CORONARY ANGIOPLASTY WITH STENT PLACEMENT     DES in SVG to right coronary artery system   CORONARY ARTERY BYPASS GRAFT  10/13/2005   LIMA to LAD, SVG to intermediate branch, sequential SVG to PDA and RPL   CORONARY ARTERY BYPASS GRAFT N/A 07/11/2016   Procedure: REDO CORONARY ARTERY BYPASS GRAFTING (CABG) x two using left leg greater saphenous vein harvested endoscopically-SVG to PDA -SVG to RAMUS INTERMEDIATE;  Surgeon: Rexene Alberts, MD;  Location: [facility];  Service: Open Heart Surgery;  Laterality: N/A;   CORONARY ARTERY BYPASS GRAFT N/A 07/11/2016   Procedure: Re-exploration (CABG) for post op bleeding,;  Surgeon: Rexene Alberts, MD;  Location: [place] [facility];  Service: Open Heart Surgery;  Laterality: N/A;   EP IMPLANTABLE DEVICE N/A 07/25/2016   Procedure: BiV ICD Insertion CRT-D;  Surgeon: Will Meredith Leeds, MD;  Location: [place] [facility];  Service: Cardiovascular;  Laterality: N/A;   INGUINAL HERNIA REPAIR Left 08/20/2017   INGUINAL HERNIA REPAIR Left 08/20/2017   Procedure: OPEN LEFT INGUINAL HERNIA REPAIR;  Surgeon: Alphonsa Overall, MD;  Location: [facility];  Service: General;  Laterality: Left;   LEFT HEART CATH AND CORS/GRAFTS ANGIOGRAPHY N/A 12/18/2016   Procedure: Left Heart Cath and Cors/Grafts Angiography;  Surgeon: Christian Mocha, MD;  Location: [place] [facility];  Service: Cardiovascular;  Laterality: N/A;   LEFT HEART CATH AND CORS/GRAFTS ANGIOGRAPHY N/A 08/22/2017   Procedure: LEFT HEART CATH AND CORS/GRAFTS ANGIOGRAPHY;  Surgeon: Larey Dresser, MD;  Location: [place] [facility];  Service: Cardiovascular;  Laterality: N/A;   MITRAL VALVE REPLACEMENT N/A 07/11/2016   Procedure: MITRAL VALVE (MV) REPLACEMENT;  Surgeon: Rexene Alberts, MD;  Location: [facility];  Service: Open Heart Surgery;  Laterality: N/A;   MYOCARDICAL PERFUSION   10/09/2007   NORMAL PERFUSION IN ALL REGIONS;NO EVIDENCE OF INDUCIBLE ISCHEMIA;POST STRESS EF% 66   RENAL ARTERY STENT Right 2007   RENAL DOPPLER  03/28/2010   RIGHT RA-NORMAL;LEFT PROXIMAL RA AT STENT-PATENT WITH NO EVIDENCE OF SIGN DIAMETER REDUCTION. R & L KIDNEYS: EQUAL IN SIZE,SYMMETRICAL IN SHAPE.   RIGHT HEART CATH N/A 01/08/2020   Procedure: RIGHT HEART CATH;  Surgeon: Larey Dresser, MD;  Location: [place] [facility];  Service: Cardiovascular;  Laterality: N/A;   TEE WITHOUT CARDIOVERSION N/A 06/15/2016   Procedure: TRANSESOPHAGEAL ECHOCARDIOGRAM (TEE);  Surgeon: Christian Klein, MD;  Location: [facility] ENDOSCOPY;  Service: Cardiovascular;  Laterality: N/A;   TEE WITHOUT CARDIOVERSION N/A 07/11/2016   Procedure: TRANSESOPHAGEAL ECHOCARDIOGRAM (TEE);  Surgeon: Rexene Alberts, MD;  Location: [facility];  Service: Open Heart Surgery;  Laterality: N/A;   TEE WITHOUT CARDIOVERSION N/A 07/19/2016   Procedure: TRANSESOPHAGEAL ECHOCARDIOGRAM (TEE);  Surgeon: Christian Perla, MD;  Location: [facility] ENDOSCOPY;  Service: Cardiovascular;  Laterality: N/A;   TRANSESOPHAGEAL ECHOCARDIOGRAM  10/19/2005   NORMAL LV; MILD TO MODERATE AMOUNT OF SOFT ATHEROMATOUS PLAQUE OF THE THORACIC AORTA; THE LEFT ATRIUM IS MILDLY DILATED;LEFT ATRIAL APPENDAGE FUNCTION IS NORMAL;NO THROMBUS IDENTIFIED. SMALL PFO WITH RIGHT TO LEFT SHUNT    Current Medications: Outpatient Medications Prior to Visit  Medication Sig Dispense Refill   aspirin EC 81 MG tablet Take 81 mg by mouth daily.      Blood Glucose Monitoring Suppl (ACCU-CHEK GUIDE ME) w/Device KIT 1 Piece by Does not apply route as directed. 1 kit 0   carvedilol (COREG) 12.5 MG tablet TAKE 1 & 1/2 (ONE & ONE-HALF) TABLETS BY MOUTH TWICE DAILY WITH A MEAL 180 tablet 3   cholecalciferol (VITAMIN D3) 25 MCG (1000 UNIT) tablet Take 2,000 Units by mouth daily with breakfast.     clopidogrel (PLAVIX) 75 MG tablet Take 1 tablet by mouth once daily 90 tablet 0   dapagliflozin propanediol  (FARXIGA) 10 MG TABS tablet TAKE 1 TABLET BY MOUTH ONCE DAILY BEFORE BREAKFAST **DICOUNTINE TRADJENTA** 90 tablet 3   digoxin (LANOXIN) 0.125 MG tablet Take 1/2 (one-half) tablet by mouth once daily 45 tablet 4   eplerenone (INSPRA) 50 MG tablet Take 1 tablet (50 mg total) by mouth daily. 90 tablet 3   glucose blood (ACCU-CHEK GUIDE) test strip Use  to monitor glucose once a day at fasting. 100 each 2   levothyroxine (SYNTHROID) 25 MCG tablet TAKE 1 TABLET BY MOUTH EVERY OTHER DAY BEFORE BREAKFAST ALTERNATING WITH 50MCG TABLET 45 tablet 0   levothyroxine (SYNTHROID) 50 MCG tablet TAKE 1 TABLET  BY MOUTH EVERY OTHER DAY ALTERNATING WITH 1 TABLET OF 25 MCG TABLET 45 tablet 0   rosuvastatin (CRESTOR) 10 MG tablet Take 1 tablet (10 mg total) by mouth daily. 90 tablet 3   sacubitril-valsartan (ENTRESTO) 97-103 MG Take 1 tablet by mouth 2 (two) times daily. 180 tablet 3   sodium zirconium cyclosilicate (LOKELMA) 10 g PACK packet DISSOLVE 1 PACKET IN WATER & DRINK ONCE DAILY 90 each 0   amoxicillin (AMOXIL) 500 MG tablet Take 4 tablets (2,000 mg total) by mouth See admin instructions. Take 2000 mg 1 hour prior to dental work (Patient not taking: Reported on 08/28/2022) 4 tablet 0   furosemide (LASIX) 20 MG tablet Take 1 tablet (20 mg total) by mouth daily as needed (for a weight over 190 lbs). (Patient not taking: Reported on 08/28/2022) 30 tablet 3   neomycin-bacitracin-polymyxin (NEOSPORIN) ointment Apply 1 application topically daily as needed for wound care. (Patient not taking: Reported on 08/28/2022)     nitroGLYCERIN (NITROSTAT) 0.4 MG SL tablet DISSOLVE ONE TABLET UNDER THE TONGUE EVERY 5 MINUTES AS NEEDED FOR CHEST PAIN.  DO NOT EXCEED A TOTAL OF 3 DOSES IN 15 MINUTES (Patient not taking: Reported on 08/28/2022) 25 tablet 2   Tetrahydrozoline HCl (VISINE OP) Place 1 drop into both eyes daily as needed (irritation). (Patient not taking: Reported on 08/28/2022)     No facility-administered medications  prior to visit.     Allergies:   Xanax [alprazolam]   Social History   Socioeconomic History   Marital status: Married    Spouse name: Not on file   Number of children: 0   Years of education: 18   Highest education level: Not on file  Occupational History   Occupation: retired  Tobacco Use   Smoking status: Never   Smokeless tobacco: Never  Vaping Use   Vaping Use: Never used  Substance and Sexual Activity   Alcohol use: No   Drug use: No   Sexual activity: Never  Other Topics Concern   Not on file  Social History Narrative   Right handed   Two story home   Drinks half and half coffee   Social Determinants of Health   Financial Resource Strain: Not on file  Food Insecurity: Not on file  Transportation Needs: Not on file  Physical Activity: Not on file  Stress: Not on file  Social Connections: Not on file     Family History:  The patient's family history includes Heart attack in his father and mother; Heart disease in his father, maternal grandmother, and mother; Hypertension in his father and sister.   ROS:   Please see the history of present illness.    ROS All other systems are reviewed and are negative.   PHYSICAL EXAM:   VS:  BP 132/76 (BP Location: Left Arm, Patient Position: Sitting, Cuff Size: Normal)   Pulse 67   Ht 6' (1.829 m)   Wt 86.5 kg   SpO2 98%   BMI 25.85 kg/m       General: Alert, oriented x3, no distress, healthy left subclavian defibrillator site Head: no evidence of trauma, PERRL, EOMI, no exophtalmos or lid lag, no myxedema, no xanthelasma; normal ears, nose and oropharynx.  Large right submandibular cystic soft mass appears unchanged Neck: normal jugular venous pulsations and no hepatojugular reflux; brisk carotid pulses without delay and no carotid bruits Chest: clear to auscultation, no signs of consolidation by percussion or palpation, normal fremitus, symmetrical and full respiratory excursions  Cardiovascular: normal position  and quality of the apical impulse, regular rhythm, normal first and second heart sounds, no murmurs, rubs or gallops Abdomen: no tenderness or distention, no masses by palpation, no abnormal pulsatility or arterial bruits, normal bowel sounds, no hepatosplenomegaly Extremities: no clubbing, cyanosis or edema; 2+ radial, ulnar and brachial pulses bilaterally; 2+ right femoral, posterior tibial and dorsalis pedis pulses; 2+ left femoral, posterior tibial and dorsalis pedis pulses; no subclavian or femoral bruits Neurological: grossly nonfocal Psych: Normal mood and affect    Wt Readings from Last 3 Encounters:  08/28/22 86.5 kg  08/09/22 85.8 kg  05/31/22 86.9 kg      Studies/Labs Reviewed:   ECHO 05/31/2022   1. Left ventricular ejection fraction, by estimation, is <20%. The left  ventricle has severely decreased function. The left ventricle demonstrates  global hypokinesis. The left ventricular internal cavity size was mildly  dilated. There is mild left  ventricular hypertrophy. Left ventricular diastolic parameters are  indeterminate.   2. Right ventricular systolic function is mildly reduced. The right  ventricular size is normal. There is mildly elevated pulmonary artery  systolic pressure. The estimated right ventricular systolic pressure is  69.6 mmHg.   3. Left atrial size was mild to moderately dilated.   4. Bioprosthetic mitral valve. Mean gradient 5 mmHg, no more than mild  mitral stenosis. No significant mitral regurgitation.   5. The aortic valve is tricuspid. There is mild calcification of the  aortic valve. Aortic valve regurgitation is not visualized. No aortic  stenosis is present.   6. The inferior vena cava is normal in size with greater than 50%  respiratory variability, suggesting right atrial pressure of 3 mmHg.     EKG: ECG is now at ordered today.  ECG at his previous appointment in July showed alternating atrial sensed (sinus) atrial paced beats with  biventricular pacing as well as frequent PVCs.  The paced QRS complex is rather broad at 186 ms, but has a distinct positive R wave in lead V1.  QTc 509 ms. Recent Labs: 05/31/2022: B Natriuretic Peptide 3,674.3 08/01/2022: ALT 16; BUN 28; Creatinine, Ser 1.43; Potassium 4.5; Sodium 140; TSH 4.080   Lipid Panel    Component Value Date/Time   CHOL 129 05/31/2022 1142   CHOL 120 10/01/2018 0925   TRIG 81 05/31/2022 1142   HDL 37 (L) 05/31/2022 1142   HDL 31 (L) 10/01/2018 0925   CHOLHDL 3.5 05/31/2022 1142   VLDL 16 05/31/2022 1142   LDLCALC 76 05/31/2022 1142   LDLCALC 60 10/01/2018 0925    ASSESSMENT:    1. Chronic combined systolic and diastolic heart failure ([place])   2. Coronary artery disease of native artery of native heart with stable angina pectoris ([place])   3. History of atrial flutter   4. Status post mitral valve replacement with bioprosthetic valve   5. Presence of biventricular implantable cardioverter-defibrillator (ICD)   6. PVCs (premature ventricular contractions)   7. Essential hypertension   8. Dyslipidemia (high LDL; low HDL)   9. Type 2 diabetes mellitus with stage 3b chronic kidney disease, without long-term current use of insulin ([place])   10. Left renal artery stenosis ([facility])   11. Submandibular gland mass   12. Mass of upper lobe of left lung       PLAN:  In order of problems listed above:  CHF: Appears to be back to baseline clinically.  NYHA functional class II.  Clinically euvolemic.  OptiVol normalized.  I think  we should reset his "dry weight" 290 pounds on his home scale, roughly 5 pounds more in office scale.  He is well within that range today.  He is on maximum tolerated doses of carvedilol, Entresto and eplerenone and dapagliflozin.  Also on digoxin.  Requires Lokelma to keep his potassium in normal range.  On most recent echo LVEF is less than 20%. CAD: Denies any angina at rest or with activity. AFlutter: No recurrence since his ablation, based  on pacemaker monitoring.  He is not taking anticoagulants or antiarrhythmics S/P MVR: No more bioprosthetic valve function on echo performed 05/31/2022 with a mean mitral gradient 5 mmHg and no regurgitation. CRT-D: Normal device function.  Efficient BiV pacing has improved to 95%.  Previously it was as low as 86%, due to frequent PVCs.  Unclear whether this was because or effect of his heart failure exacerbation.  PVCs: Diminished in frequency.  So far we have decided not to provide specific treatment for the PVCs, other than the beta-blocker.  He did not tolerate higher doses of carvedilol due to hypotension.  Consider mexiletine in the future. Hx HTN: Well-controlled, even lower when he checks it at home. HLP: LDL close to target range, HDL chronically low.  On rosuvastatin. DM: Improving control, A1c recently 7.2%.  In the past he had as good as 6.0% but has become more lax with his diet. CKD 3b: Most recent creatinine 1.43.  Stable renal function with a creatinine around 1.5, GFR roughly 45.  Requires Lokelma to prevent hyperkalemia while taking Entresto and eplerenone. History of left renal artery stenosis status post stent 2007.  Stable renal function and well-controlled hypertension. Submandibular mass: Reluctant to undergo general anesthesia which would be necessary for resection of this mass.  Also aware of the risk for cranial nerve palsy.  Precluded implantation of a Barostim device. Left upper lobe paramediastinal mass: We discussed whether or not we should continue imaging this.  I do not think he is by any means a candidate for a third open chest procedure.  Also unlikely we could biopsy this abnormality which is abutting the LIMA bypass graft.  If it does appear to be growing, radiation therapy might be a consideration.  First, I think we should obtain a another follow-up CT so that comparison can  be done truly side-by-side.  (The initial report was on images performed in [place], [place], it  appears that our local radiologist did not have access to those images, although he did specify that the abnormality was new when compared with the previous CT from October 2017.).  Will schedule for another CT next month.    Medication Adjustments/Labs and Tests Ordered: Current medicines are reviewed at length with the patient today.  Concerns regarding medicines are outlined above.  Medication changes, Labs and Tests ordered today are listed in the Patient Instructions below. Patient Instructions  Medication Instructions:  Your physician recommends that you continue on your current medications as directed. Please refer to the Current Medication list given to you today.  *If you need a refill on your cardiac medications before your next appointment, please call your pharmacy*  Testing/Procedures: CT chest w/o contrast due in January at [facility] imaging --they will call to schedule this.  Follow-Up: At [facility], you and your health needs are our priority.  As part of our continuing mission to provide you with exceptional heart care, we have created designated Provider Care Teams.  These Care Teams include your primary Cardiologist (physician)  and Advanced Practice Providers (APPs -  Physician Assistants and Nurse Practitioners) who all work together to provide you with the care you need, when you need it.  We recommend signing up for the patient portal called "MyChart".  Sign up information is provided on this After Visit Summary.  MyChart is used to connect with patients for Virtual Visits (Telemedicine).  Patients are able to view lab/test results, encounter notes, upcoming appointments, etc.  Non-urgent messages can be sent to your provider as well.   To learn more about what you can do with MyChart, go to [email or web address].    Your next appointment:   August with Dr. Sallyanne Kuster         Signed, Christian Klein, MD  09/03/2022 5:21 PM    [address], [place], [place]  [address] Phone: [phone]; Fax: [phone]

## 2022-09-18 ENCOUNTER — Ambulatory Visit (INDEPENDENT_AMBULATORY_CARE_PROVIDER_SITE_OTHER): Payer: Medicare Other

## 2022-09-18 DIAGNOSIS — I5042 Chronic combined systolic (congestive) and diastolic (congestive) heart failure: Secondary | ICD-10-CM | POA: Diagnosis not present

## 2022-09-18 DIAGNOSIS — Z95810 Presence of automatic (implantable) cardiac defibrillator: Secondary | ICD-10-CM

## 2022-09-21 NOTE — [place] Notes (Signed)
EPIC Encounter for ICM Monitoring  Patient Name: Christian Bowman is a 76 y.o. male Date: 09/21/2022 Primary Care Physican: Sharilyn Sites, MD Primary Cardiologist: [facility] Electrophysiologist: Curt Bears Bi-V Pacing: 93.8%            05/28/2022 Weight: 188.6 lbs 05/29/2022 Weight: 186.1 lbs  07/04/2022 Weight: 183-185 lbs 08/16/2022 Weight: 185 lbs    09/22/2023 Weight: 183.3 lbs                                                       Spoke with wife, per DPR.  Pt had weight gain last week and took PRN Furosemide and taken a couple times since last ICM call.  Pt weighs daily and monitors salt very closely.     Diet: Wife records sodium intake by reviewing food labels.     Optivol thoracic impedance suggesting normal fluid levels.    Prescribed:  Furosemide 20 mg Take 1 tablet (20 mg total) by mouth daily as needed (for a weight over 190 lbs).   Recommendations:  No changes and encouraged to call if experiencing any fluid symptoms.   Follow-up plan: ICM clinic phone appointment on 10/23/2022.   91 day device clinic remote transmission 10/24/2022.     EP/Cardiology Office Visits:  Recall 09/28/2022 with Dr. Aundra Dubin.   Recall 05/25/2023 with Dr Sallyanne Kuster. 10/17/2022 with Dr Curt Bears.   Copy of ICM check sent to Dr. Curt Bears.    3 month ICM trend: 09/20/2022.    12-14 Month ICM trend:     Rosalene Billings, RN 09/21/2022 2:50 PM

## 2022-09-23 ENCOUNTER — Other Ambulatory Visit (HOSPITAL_COMMUNITY): Payer: Self-pay | Admitting: Cardiology

## 2022-09-27 ENCOUNTER — Telehealth: Payer: Self-pay | Admitting: Cardiovascular Disease

## 2022-09-27 MED ORDER — LOKELMA 10 G PO PACK: PACK | ORAL | 3 refills | Status: DC

## 2022-09-27 NOTE — Telephone Encounter (Signed)
 *  STAT* If patient is at the pharmacy, call can be transferred to refill team.   1. Which medications need to be refilled? (please list name of each medication and dose if known)   sodium zirconium cyclosilicate (LOKELMA) 10 g PACK packet    2. Which pharmacy/location (including street and city if local pharmacy) is medication to be sent to? [place], [place]   3. Do they need a 30 day or 90 day supply? 90 days  Pt needs 90 days supply and need refill today

## 2022-10-07 ENCOUNTER — Other Ambulatory Visit: Payer: Self-pay | Admitting: "Endocrinology

## 2022-10-07 ENCOUNTER — Other Ambulatory Visit (HOSPITAL_COMMUNITY): Payer: Self-pay | Admitting: Cardiology

## 2022-10-17 ENCOUNTER — Encounter: Payer: Self-pay | Admitting: Cardiology

## 2022-10-17 ENCOUNTER — Ambulatory Visit: Payer: Medicare Other | Attending: Cardiology | Admitting: Cardiology

## 2022-10-17 VITALS — BP 148/78 | HR 71 | Ht 72.0 in | Wt 191.0 lb

## 2022-10-17 DIAGNOSIS — Z95810 Presence of automatic (implantable) cardiac defibrillator: Secondary | ICD-10-CM | POA: Diagnosis not present

## 2022-10-17 DIAGNOSIS — I2510 Atherosclerotic heart disease of native coronary artery without angina pectoris: Secondary | ICD-10-CM | POA: Insufficient documentation

## 2022-10-17 DIAGNOSIS — I5022 Chronic systolic (congestive) heart failure: Secondary | ICD-10-CM | POA: Insufficient documentation

## 2022-10-17 NOTE — Progress Notes (Signed)
Electrophysiology Office Note   Date:  10/17/2022   ID:  Christian Bowman, DOB [birth year], MRN [id number]  PCP:  Sharilyn Sites, MD  Cardiologist:  Aundra Dubin Primary Electrophysiologist:  Ysabela Keisler Meredith Leeds, MD    No chief complaint on file.     History of Present Illness: Christian Bowman is a 77 y.o. male who presents today for electrophysiology evaluation.     He has a history significant for coronary post CABG in 2007 with redo CABG September 2017 and mitral valve replacement.  He had bioprosthetic mitral valve due to infarct-related mitral regurgitation.  Postop he developed complete heart block and is status post Medtronic CRT-D implanted 07/25/2016.  He subsequently went into atrial flutter and is post ablation 05/11/2017.  Today, denies symptoms of palpitations, chest pain, shortness of breath, orthopnea, PND, lower extremity edema, claudication, dizziness, presyncope, syncope, bleeding, or neurologic sequela. The patient is tolerating medications without difficulties.  He is currently feeling well.  He has no chest pain or shortness of breath.  He is able to do all of his daily activities.  He is going to cardiac rehab without issue.  Past Medical History:  Diagnosis Date   AICD (automatic cardioverter/defibrillator) present    medtronic-   DR. CROITORU , DR. Aundra Dubin    Anginal pain ([place])    cp sat 08/11/17   Anxiety    CAD (coronary artery disease)    CHF (congestive heart failure) ([place])    Complication of anesthesia    took awhile to wake up    Coronary artery disease involving coronary bypass graft    Cyst of neck    right side   DM2 (diabetes mellitus, type 2) ([place]) 08/26/2013   Dyspnea    Heart attack ([place])    "not sure when" (08/20/2017)   HTN (hypertension) 08/26/2013   Hyperlipidemia 08/26/2013   Hypothyroidism    Left main coronary artery disease    Left renal artery stenosis ([place])    Genesis 6x12 stent 2007   Obesity (BMI 30.0-34.9) 08/26/2013   Postoperative  atrial fibrillation ([place]) 10/15/2005   Presence of permanent cardiac pacemaker    S/P CABG x 4 10/13/2005   LIMA to LAD, SVG to intermediate branch, sequential SVG to PDA and RPL branch, EVH via right thigh   S/P mitral valve replacement with bioprosthetic valve 07/11/2016   31 mm [facility] Mitral bovine bioprosthetic tissue valve   S/P redo CABG x 2 07/11/2016   SVG to PDA and SVG to Intermediate Branch, EVH via left thigh   Past Surgical History:  Procedure Laterality Date   A-FLUTTER ABLATION N/A 05/11/2017   Procedure: A-Flutter Ablation;  Surgeon: Constance Haw, MD;  Location: [place] [facility];  Service: Cardiovascular;  Laterality: N/A;   CARDIAC CATHETERIZATION N/A 06/21/2016   Procedure: Right/Left Heart Cath and Coronary/Graft Angiography;  Surgeon: Sherren Mocha, MD;  Location: [place] [facility];  Service: Cardiovascular;  Laterality: N/A;   CARDIAC VALVE REPLACEMENT     CARDIOVERSION N/A 07/19/2016   Procedure: CARDIOVERSION;  Surgeon: Lelon Perla, MD;  Location: [facility] ENDOSCOPY;  Service: Cardiovascular;  Laterality: N/A;   CARDIOVERSION N/A 09/08/2016   Procedure: CARDIOVERSION;  Surgeon: Fay Records, MD;  Location: [facility] ENDOSCOPY;  Service: Cardiovascular;  Laterality: N/A;   CORONARY ANGIOPLASTY     STENT 2016  [facility]   CORONARY ANGIOPLASTY WITH STENT PLACEMENT     DES in SVG to right coronary artery system   CORONARY ARTERY  BYPASS GRAFT  10/13/2005   LIMA to LAD, SVG to intermediate branch, sequential SVG to PDA and RPL   CORONARY ARTERY BYPASS GRAFT N/A 07/11/2016   Procedure: REDO CORONARY ARTERY BYPASS GRAFTING (CABG) x two using left leg greater saphenous vein harvested endoscopically-SVG to PDA -SVG to RAMUS INTERMEDIATE;  Surgeon: Rexene Alberts, MD;  Location: [place];  Service: Open Heart Surgery;  Laterality: N/A;   CORONARY ARTERY BYPASS GRAFT N/A 07/11/2016   Procedure: Re-exploration (CABG) for post op bleeding,;  Surgeon: Rexene Alberts, MD;  Location: [place];  Service: Open Heart Surgery;  Laterality: N/A;   EP IMPLANTABLE DEVICE N/A 07/25/2016   Procedure: BiV ICD Insertion CRT-D;  Surgeon: Konnie Noffsinger Meredith Leeds, MD;  Location: [place] [facility];  Service: Cardiovascular;  Laterality: N/A;   INGUINAL HERNIA REPAIR Left 08/20/2017   INGUINAL HERNIA REPAIR Left 08/20/2017   Procedure: OPEN LEFT INGUINAL HERNIA REPAIR;  Surgeon: Alphonsa Overall, MD;  Location: [place];  Service: General;  Laterality: Left;   LEFT HEART CATH AND CORS/GRAFTS ANGIOGRAPHY N/A 12/18/2016   Procedure: Left Heart Cath and Cors/Grafts Angiography;  Surgeon: Sherren Mocha, MD;  Location: [place] [facility];  Service: Cardiovascular;  Laterality: N/A;   LEFT HEART CATH AND CORS/GRAFTS ANGIOGRAPHY N/A 08/22/2017   Procedure: LEFT HEART CATH AND CORS/GRAFTS ANGIOGRAPHY;  Surgeon: Larey Dresser, MD;  Location: [place] [facility];  Service: Cardiovascular;  Laterality: N/A;   MITRAL VALVE REPLACEMENT N/A 07/11/2016   Procedure: MITRAL VALVE (MV) REPLACEMENT;  Surgeon: Rexene Alberts, MD;  Location: [place];  Service: Open Heart Surgery;  Laterality: N/A;   MYOCARDICAL PERFUSION  10/09/2007   NORMAL PERFUSION IN ALL REGIONS;NO EVIDENCE OF INDUCIBLE ISCHEMIA;POST STRESS EF% 66   RENAL ARTERY STENT Right 2007   RENAL DOPPLER  03/28/2010   RIGHT RA-NORMAL;LEFT PROXIMAL RA AT STENT-PATENT WITH NO EVIDENCE OF SIGN DIAMETER REDUCTION. R & L KIDNEYS: EQUAL IN SIZE,SYMMETRICAL IN SHAPE.   RIGHT HEART CATH N/A 01/08/2020   Procedure: RIGHT HEART CATH;  Surgeon: Larey Dresser, MD;  Location: [place] [facility];  Service: Cardiovascular;  Laterality: N/A;   TEE WITHOUT CARDIOVERSION N/A 06/15/2016   Procedure: TRANSESOPHAGEAL ECHOCARDIOGRAM (TEE);  Surgeon: Sanda Klein, MD;  Location: [facility] ENDOSCOPY;  Service: Cardiovascular;  Laterality: N/A;   TEE WITHOUT CARDIOVERSION N/A 07/11/2016   Procedure: TRANSESOPHAGEAL ECHOCARDIOGRAM (TEE);  Surgeon: Rexene Alberts, MD;   Location: [place];  Service: Open Heart Surgery;  Laterality: N/A;   TEE WITHOUT CARDIOVERSION N/A 07/19/2016   Procedure: TRANSESOPHAGEAL ECHOCARDIOGRAM (TEE);  Surgeon: Lelon Perla, MD;  [facility] ENDOSCOPY;  Service: Cardiovascular;  Laterality: N/A;   TRANSESOPHAGEAL ECHOCARDIOGRAM  10/19/2005   NORMAL LV; MILD TO MODERATE AMOUNT OF SOFT ATHEROMATOUS PLAQUE OF THE THORACIC AORTA; THE LEFT ATRIUM IS MILDLY DILATED;LEFT ATRIAL APPENDAGE FUNCTION IS NORMAL;NO THROMBUS IDENTIFIED. SMALL PFO WITH RIGHT TO LEFT SHUNT     Current Outpatient Medications  Medication Sig Dispense Refill   aspirin EC 81 MG tablet Take 81 mg by mouth daily.      Blood Glucose Monitoring Suppl (ACCU-CHEK GUIDE ME) w/Device KIT 1 Piece by Does not apply route as directed. 1 kit 0   carvedilol (COREG) 12.5 MG tablet TAKE 1 AND ONE HALF TABLETS BY MOUTH TWICE DAILY WITH A MEAL 180 tablet 0   cholecalciferol (VITAMIN D3) 25 MCG (1000 UNIT) tablet Take 2,000 Units by mouth daily with breakfast.     clopidogrel (PLAVIX) 75 MG tablet  Take 1 tablet by mouth once daily 90 tablet 0   dapagliflozin propanediol (FARXIGA) 10 MG TABS tablet TAKE 1 TABLET BY MOUTH ONCE DAILY BEFORE BREAKFAST **DICOUNTINE TRADJENTA** 90 tablet 3   digoxin (LANOXIN) 0.125 MG tablet Take 1/2 (one-half) tablet by mouth once daily 45 tablet 4   eplerenone (INSPRA) 50 MG tablet Take 1 tablet (50 mg total) by mouth daily. 90 tablet 3   furosemide (LASIX) 20 MG tablet Take 1 tablet (20 mg total) by mouth daily as needed (for a weight over 190 lbs). 30 tablet 3   glucose blood (ACCU-CHEK GUIDE) test strip Use  to monitor glucose once a day at fasting. 100 each 2   levothyroxine (SYNTHROID) 25 MCG tablet TAKE 1 TABLET BY MOUTH EVERY OTHER DAY BEFORE BREAKFAST. ALTERNATING WITH 50 MCG TABLET 45 tablet 0   levothyroxine (SYNTHROID) 50 MCG tablet TAKE 1 TABLET BY MOUTH EVERY OTHER DAY ALTERNATING WITH 25 MCG TABLET 45 tablet 0   neomycin-bacitracin-polymyxin  (NEOSPORIN) ointment Apply 1 application  topically daily as needed for wound care.     nitroGLYCERIN (NITROSTAT) 0.4 MG SL tablet DISSOLVE ONE TABLET UNDER THE TONGUE EVERY 5 MINUTES AS NEEDED FOR CHEST PAIN.  DO NOT EXCEED A TOTAL OF 3 DOSES IN 15 MINUTES 25 tablet 2   rosuvastatin (CRESTOR) 10 MG tablet Take 1 tablet (10 mg total) by mouth daily. 90 tablet 3   sacubitril-valsartan (ENTRESTO) 97-103 MG Take 1 tablet by mouth 2 (two) times daily. 180 tablet 3   sodium zirconium cyclosilicate (LOKELMA) 10 g PACK packet DISSOLVE 1 PACKET IN WATER & DRINK ONCE DAILY 90 each 3   Tetrahydrozoline HCl (VISINE OP) Place 1 drop into both eyes daily as needed (irritation).     amoxicillin (AMOXIL) 500 MG tablet Take 4 tablets (2,000 mg total) by mouth See admin instructions. Take 2000 mg 1 hour prior to dental work (Patient not taking: Reported on 10/17/2022) 4 tablet 0   No current facility-administered medications for this visit.    Allergies:   Xanax [alprazolam]   Social History:  The patient  reports that he has never smoked. He has never used smokeless tobacco. He reports that he does not drink alcohol and does not use drugs.   Family History:  The patient's family history includes Heart attack in his father and mother; Heart disease in his father, maternal grandmother, and mother; Hypertension in his father and sister.   ROS:  Please see the history of present illness.   Otherwise, review of systems is positive for none.   All other systems are reviewed and negative.   PHYSICAL EXAM: VS:  BP (!) 148/78   Pulse 71   Ht 6' (1.829 m)   Wt 191 lb (86.6 kg)   SpO2 98%   BMI 25.90 kg/m  , BMI Body mass index is 25.9 kg/m. GEN: Well nourished, well developed, in no acute distress  HEENT: normal  Neck: no JVD, carotid bruits, or masses Cardiac: RRR; no murmurs, rubs, or gallops,no edema  Respiratory:  clear to auscultation bilaterally, normal work of breathing GI: soft, nontender,  nondistended, + BS MS: no deformity or atrophy  Skin: warm and dry, device site well healed Neuro:  Strength and sensation are intact Psych: euthymic mood, full affect  EKG:  EKG is ordered today. Personal review of the ekg ordered shows A sense, V pace  Personal review of the device interrogation today. Results in [place]: 05/31/2022: B Natriuretic Peptide  6,195.0 08/01/2022: ALT 16; BUN 28; Creatinine, Ser 1.43; Potassium 4.5; Sodium 140; TSH 4.080    Lipid Panel     Component Value Date/Time   CHOL 129 05/31/2022 1142   CHOL 120 10/01/2018 0925   TRIG 81 05/31/2022 1142   HDL 37 (L) 05/31/2022 1142   HDL 31 (L) 10/01/2018 0925   CHOLHDL 3.5 05/31/2022 1142   VLDL 16 05/31/2022 1142   LDLCALC 76 05/31/2022 1142   LDLCALC 60 10/01/2018 0925     Wt Readings from Last 3 Encounters:  10/17/22 191 lb (86.6 kg)  08/28/22 190 lb 9.6 oz (86.5 kg)  08/09/22 189 lb 3.2 oz (85.8 kg)      Other studies Reviewed: Additional studies/ records that were reviewed today include: TEE 07/19/16  Review of the above records today demonstrates:  - Left ventricle: The cavity size was severely dilated. Systolic   function was severely reduced. The estimated ejection fraction   was in the range of 20% to 25%. Diffuse hypokinesis. - Aortic valve: No evidence of vegetation. - Mitral valve: A bioprosthesis was present. - Left atrium: The atrium was moderately dilated. No evidence of   thrombus in the atrial cavity or appendage. - Right ventricle: Systolic function was moderately reduced. - Right atrium: No evidence of thrombus in the atrial cavity or   appendage. - Atrial septum: No defect or patent foramen ovale was identified. - Tricuspid valve: No evidence of vegetation. There was   mild-moderate regurgitation. - Pulmonic valve: No evidence of vegetation.  Cath 06/20/17 1. Severe native three-vessel coronary artery disease with total occlusion of the LAD, total occlusion  of the ramus intermedius, and total occlusion of the RCA 2. Continued patency of the LIMA to LAD with total occlusion of the apical LAD appearance unchanged from previous cath study 3. Patency of the most recent saphenous vein graft to PDA without significant stenosis present 4. Interval occlusion of the old saphenous vein graft PDA 5. Nonvisualization of the saphenous vein graft to diagonal, suspect total occlusion from nonselective imaging 6. Low LVEDP  ASSESSMENT AND PLAN:  1.  Typical atrial flutter: Status post ablation.  No recurrences.  CHA2DS2-VASc of 4 off anticoagulation.  2.  Complete heart block: Status Medtronic CRT-D.  Device functioning appropriately.  No changes at this time.  3.  Chronic systolic heart failure: Currently on optimal medical therapy.  Status post Medtronic CRT-D.  Device functioning appropriately.  No changes at this time.  4.  Mitral regurgitation: Status post bioprosthetic mitral valve replacement.  Stable on most recent echo.  5.  Coronary artery disease: Status post repeat CABG.  Stable angina.  No changes at this time.  6.  Nonsustained VT: Found on device interrogation.  Currently on carvedilol 18.75 mg twice daily.  No changes.   Current medicines are reviewed at length with the patient today.   The patient does not have concerns regarding his medicines.  The following changes were made today: none  Labs/ tests ordered today include:  Orders Placed This Encounter  Procedures   EKG 12-Lead      Disposition:   FU 12 months  Signed, Calil Amor Meredith Leeds, MD  10/17/2022 4:40 PM     [address] [place] [address] [phone] (office) [phone] (fax)

## 2022-10-23 ENCOUNTER — Ambulatory Visit: Payer: Medicare Other

## 2022-10-23 DIAGNOSIS — Z95810 Presence of automatic (implantable) cardiac defibrillator: Secondary | ICD-10-CM

## 2022-10-23 DIAGNOSIS — I5022 Chronic systolic (congestive) heart failure: Secondary | ICD-10-CM

## 2022-10-23 NOTE — Progress Notes (Signed)
EPIC Encounter for ICM Monitoring  Patient Name: Christian Bowman is a 77 y.o. male Date: 10/23/2022 Primary Care Physican: Sharilyn Sites, MD Primary Cardiologist: Croitoru/McLean Electrophysiologist: Curt Bears Bi-V Pacing: 92.7%            05/28/2022 Weight: 188.6 lbs 05/29/2022 Weight: 186.1 lbs  07/04/2022 Weight: 183-185 lbs 08/16/2022 Weight: 185 lbs    09/21/2022 Weight: 183.3 lbs   10/18/2022 Weight: 185.1 lbs     Battery Longevity: 9 months                                                 Spoke with wife, per DPR.  Pt is feeling well and denies any fluid symptoms.  He takes PRN Furosemide for weight increase.    Diet: Wife records sodium intake by reviewing food labels.     Optivol thoracic impedance trending just below baseline normal.    Prescribed:  Furosemide 20 mg Take 1 tablet (20 mg total) by mouth daily as needed (for a weight over 190 lbs).   Recommendations:  No changes and encouraged to call if experiencing any fluid symptoms.   Follow-up plan: ICM clinic phone appointment on 10/23/2022.   91 day device clinic remote transmission 01/23/2023.     EP/Cardiology Office Visits:  12/05/2021 with Dr. Aundra Dubin.   Recall 05/25/2023 with Dr Sallyanne Kuster. 10/12/2023 with Dr Curt Bears.   Copy of ICM check sent to Dr. Curt Bears.  3 month ICM trend: 10/23/2022.    12-14 Month ICM trend:     Rosalene Billings, RN 10/23/2022 8:50 AM

## 2022-10-24 ENCOUNTER — Ambulatory Visit: Payer: Medicare Other | Attending: Cardiology

## 2022-10-24 DIAGNOSIS — I214 Non-ST elevation (NSTEMI) myocardial infarction: Secondary | ICD-10-CM | POA: Diagnosis not present

## 2022-10-24 LAB — CUP PACEART REMOTE DEVICE CHECK
Battery Remaining Longevity: 9 mo
Battery Voltage: 2.86 V
Brady Statistic AP VP Percent: 70.42 %
Brady Statistic AP VS Percent: 0.51 %
Brady Statistic AS VP Percent: 27.87 %
Brady Statistic AS VS Percent: 1.21 %
Brady Statistic RA Percent Paced: 67.55 %
Brady Statistic RV Percent Paced: 92.65 %
Date Time Interrogation Session: 20240122033424
HighPow Impedance: 56 Ohm
Implantable Lead Connection Status: 753985
Implantable Lead Connection Status: 753985
Implantable Lead Connection Status: 753985
Implantable Lead Implant Date: 20171024
Implantable Lead Implant Date: 20171024
Implantable Lead Implant Date: 20171024
Implantable Lead Location: 753858
Implantable Lead Location: 753859
Implantable Lead Location: 753860
Implantable Lead Model: 4598
Implantable Lead Model: 5076
Implantable Pulse Generator Implant Date: 20171024
Lead Channel Impedance Value: 250.943
Lead Channel Impedance Value: 255.093
Lead Channel Impedance Value: 255.093
Lead Channel Impedance Value: 270.667
Lead Channel Impedance Value: 270.667
Lead Channel Impedance Value: 342 Ohm
Lead Channel Impedance Value: 399 Ohm
Lead Channel Impedance Value: 418 Ohm
Lead Channel Impedance Value: 475 Ohm
Lead Channel Impedance Value: 532 Ohm
Lead Channel Impedance Value: 551 Ohm
Lead Channel Impedance Value: 551 Ohm
Lead Channel Impedance Value: 703 Ohm
Lead Channel Impedance Value: 874 Ohm
Lead Channel Impedance Value: 893 Ohm
Lead Channel Impedance Value: 893 Ohm
Lead Channel Impedance Value: 950 Ohm
Lead Channel Impedance Value: 988 Ohm
Lead Channel Pacing Threshold Amplitude: 0.5 V
Lead Channel Pacing Threshold Amplitude: 0.875 V
Lead Channel Pacing Threshold Amplitude: 1 V
Lead Channel Pacing Threshold Pulse Width: 0.4 ms
Lead Channel Pacing Threshold Pulse Width: 0.4 ms
Lead Channel Pacing Threshold Pulse Width: 1 ms
Lead Channel Sensing Intrinsic Amplitude: 1.25 mV
Lead Channel Sensing Intrinsic Amplitude: 1.25 mV
Lead Channel Sensing Intrinsic Amplitude: 19.25 mV
Lead Channel Sensing Intrinsic Amplitude: 19.25 mV
Lead Channel Setting Pacing Amplitude: 1.5 V
Lead Channel Setting Pacing Amplitude: 2 V
Lead Channel Setting Pacing Amplitude: 2 V
Lead Channel Setting Pacing Pulse Width: 0.4 ms
Lead Channel Setting Pacing Pulse Width: 1 ms
Lead Channel Setting Sensing Sensitivity: 0.3 mV
Zone Setting Status: 755011
Zone Setting Status: 755011

## 2022-11-01 ENCOUNTER — Ambulatory Visit
Admission: RE | Admit: 2022-11-01 | Discharge: 2022-11-01 | Disposition: A | Discharge status: Home / Self Care | Payer: Medicare Other | Source: Ambulatory Visit | Attending: Cardiovascular Disease | Admitting: Cardiovascular Disease

## 2022-11-01 DIAGNOSIS — R918 Other nonspecific abnormal finding of lung field: Secondary | ICD-10-CM

## 2022-11-06 ENCOUNTER — Other Ambulatory Visit: Payer: Self-pay | Admitting: Cardiovascular Disease

## 2022-11-08 MED ORDER — AMOXICILLIN 500 MG PO TABS: 2000.0000 mg | ORAL_TABLET | ORAL | 0 refills | Status: DC

## 2022-11-18 ENCOUNTER — Other Ambulatory Visit (HOSPITAL_COMMUNITY): Payer: Self-pay | Admitting: Cardiology

## 2022-11-21 NOTE — Progress Notes (Signed)
Remote ICD transmission.   

## 2022-11-27 ENCOUNTER — Ambulatory Visit: Payer: Medicare Other | Attending: Cardiology

## 2022-11-27 DIAGNOSIS — Z95810 Presence of automatic (implantable) cardiac defibrillator: Secondary | ICD-10-CM

## 2022-11-27 DIAGNOSIS — I5022 Chronic systolic (congestive) heart failure: Secondary | ICD-10-CM | POA: Diagnosis not present

## 2022-12-01 ENCOUNTER — Telehealth: Payer: Self-pay

## 2022-12-01 NOTE — Telephone Encounter (Signed)
Remote ICM transmission received.  Attempted call to wife/patient regarding ICM remote transmission and left detailed message per DPR.  Advised to return call for any fluid symptoms or questions. Next ICM remote transmission scheduled 01/01/2023.

## 2022-12-01 NOTE — Progress Notes (Signed)
EPIC Encounter for ICM Monitoring  Patient Name: Christian Bowman is a 77 y.o. male Date: 12/01/2022 Primary Care Physican: Sharilyn Sites, MD Primary Cardiologist: Croitoru/McLean Electrophysiologist: Curt Bears Bi-V Pacing: 92.1%            05/28/2022 Weight: 188.6 lbs 05/29/2022 Weight: 186.1 lbs  07/04/2022 Weight: 183-185 lbs 08/16/2022 Weight: 185 lbs    09/21/2022 Weight: 183.3 lbs   10/18/2022 Weight: 185.1 lbs      Battery Longevity: 9 months                                                 Attempted call to wife/patient and unable to reach.  Left detailed message per DPR regarding transmission. Transmission reviewed.    Diet: Wife records sodium intake by reviewing food labels.     Optivol thoracic impedance suggesting normal fluid levels.    Prescribed:  Furosemide 20 mg Take 1 tablet (20 mg total) by mouth daily as needed (for a weight over 190 lbs).   Recommendations:  Left voice mail with ICM number and encouraged to call if experiencing any fluid symptoms.   Follow-up plan: ICM clinic phone appointment on 01/01/2023.   91 day device clinic remote transmission 01/23/2023.     EP/Cardiology Office Visits:  12/05/2021 with Dr. Aundra Dubin.   Recall 05/25/2023 with Dr Sallyanne Kuster. 10/12/2023 with Dr Curt Bears.   Copy of ICM check sent to Dr. Curt Bears.   3 month ICM trend: 11/27/2022.    12-14 Month ICM trend:     Rosalene Billings, RN 12/01/2022 3:28 PM

## 2022-12-06 ENCOUNTER — Ambulatory Visit (HOSPITAL_COMMUNITY)
Admission: RE | Admit: 2022-12-06 | Discharge: 2022-12-06 | Disposition: A | Discharge status: Home / Self Care | Payer: Medicare Other | Source: Ambulatory Visit | Attending: Cardiology | Admitting: Cardiology

## 2022-12-06 VITALS — BP 130/80 | HR 70 | Wt 191.4 lb

## 2022-12-06 DIAGNOSIS — Z8774 Personal history of (corrected) congenital malformations of heart and circulatory system: Secondary | ICD-10-CM | POA: Insufficient documentation

## 2022-12-06 DIAGNOSIS — Z79899 Other long term (current) drug therapy: Secondary | ICD-10-CM | POA: Diagnosis not present

## 2022-12-06 DIAGNOSIS — E875 Hyperkalemia: Secondary | ICD-10-CM | POA: Diagnosis not present

## 2022-12-06 DIAGNOSIS — Z7984 Long term (current) use of oral hypoglycemic drugs: Secondary | ICD-10-CM | POA: Insufficient documentation

## 2022-12-06 DIAGNOSIS — Z8679 Personal history of other diseases of the circulatory system: Secondary | ICD-10-CM | POA: Diagnosis not present

## 2022-12-06 DIAGNOSIS — Z951 Presence of aortocoronary bypass graft: Secondary | ICD-10-CM | POA: Insufficient documentation

## 2022-12-06 DIAGNOSIS — I25118 Atherosclerotic heart disease of native coronary artery with other forms of angina pectoris: Secondary | ICD-10-CM

## 2022-12-06 DIAGNOSIS — Z7902 Long term (current) use of antithrombotics/antiplatelets: Secondary | ICD-10-CM | POA: Diagnosis not present

## 2022-12-06 DIAGNOSIS — I255 Ischemic cardiomyopathy: Secondary | ICD-10-CM | POA: Insufficient documentation

## 2022-12-06 DIAGNOSIS — Z95810 Presence of automatic (implantable) cardiac defibrillator: Secondary | ICD-10-CM | POA: Insufficient documentation

## 2022-12-06 DIAGNOSIS — I2510 Atherosclerotic heart disease of native coronary artery without angina pectoris: Secondary | ICD-10-CM | POA: Insufficient documentation

## 2022-12-06 DIAGNOSIS — Z953 Presence of xenogenic heart valve: Secondary | ICD-10-CM

## 2022-12-06 DIAGNOSIS — Z7982 Long term (current) use of aspirin: Secondary | ICD-10-CM | POA: Diagnosis not present

## 2022-12-06 DIAGNOSIS — I4892 Unspecified atrial flutter: Secondary | ICD-10-CM | POA: Insufficient documentation

## 2022-12-06 DIAGNOSIS — N183 Chronic kidney disease, stage 3 unspecified: Secondary | ICD-10-CM | POA: Insufficient documentation

## 2022-12-06 DIAGNOSIS — Z8249 Family history of ischemic heart disease and other diseases of the circulatory system: Secondary | ICD-10-CM | POA: Diagnosis not present

## 2022-12-06 DIAGNOSIS — E1122 Type 2 diabetes mellitus with diabetic chronic kidney disease: Secondary | ICD-10-CM | POA: Diagnosis not present

## 2022-12-06 DIAGNOSIS — I252 Old myocardial infarction: Secondary | ICD-10-CM | POA: Diagnosis not present

## 2022-12-06 DIAGNOSIS — I5022 Chronic systolic (congestive) heart failure: Secondary | ICD-10-CM | POA: Diagnosis present

## 2022-12-06 DIAGNOSIS — Z8673 Personal history of transient ischemic attack (TIA), and cerebral infarction without residual deficits: Secondary | ICD-10-CM | POA: Insufficient documentation

## 2022-12-06 DIAGNOSIS — I493 Ventricular premature depolarization: Secondary | ICD-10-CM

## 2022-12-06 LAB — BASIC METABOLIC PANEL
Anion gap: 8 (ref 5–15)
BUN: 27 mg/dL — ABNORMAL HIGH (ref 8–23)
CO2: 27 mmol/L (ref 22–32)
Calcium: 9.2 mg/dL (ref 8.9–10.3)
Chloride: 104 mmol/L (ref 98–111)
Creatinine, Ser: 1.52 mg/dL — ABNORMAL HIGH (ref 0.61–1.24)
GFR, Estimated: 47 mL/min — ABNORMAL LOW (ref >60)
Glucose, Bld: 176 mg/dL — ABNORMAL HIGH (ref 70–99)
Potassium: 4.3 mmol/L (ref 3.5–5.1)
Sodium: 139 mmol/L (ref 135–145)

## 2022-12-06 LAB — BRAIN NATRIURETIC PEPTIDE: B Natriuretic Peptide: 2485.3 pg/mL — ABNORMAL HIGH (ref 0.0–100.0)

## 2022-12-06 LAB — DIGOXIN LEVEL: Digoxin Level: 0.4 ng/mL — ABNORMAL LOW (ref 0.8–2.0)

## 2022-12-06 MED ORDER — AMOXICILLIN 500 MG PO TABS: 2000.0000 mg | ORAL_TABLET | ORAL | 0 refills | Status: DC

## 2022-12-06 MED ORDER — CARVEDILOL 12.5 MG PO TABS: ORAL_TABLET | ORAL | 3 refills | Status: DC

## 2022-12-06 MED ORDER — CLOPIDOGREL BISULFATE 75 MG PO TABS: 75.0000 mg | ORAL_TABLET | Freq: Every day | ORAL | 3 refills | Status: DC

## 2022-12-06 MED ORDER — DAPAGLIFLOZIN PROPANEDIOL 10 MG PO TABS: ORAL_TABLET | ORAL | 3 refills | Status: DC

## 2022-12-06 NOTE — Patient Instructions (Signed)
There has been no changes to your medications.  Labs done today, your results will be available in MyChart, we will contact you for abnormal readings.  Your physician has requested that you have an echocardiogram. Echocardiography is a painless test that uses sound waves to create images of your heart. It provides your doctor with information about the size and shape of your heart and how well your heart's chambers and valves are working. This procedure takes approximately one hour. There are no restrictions for this procedure. Please do NOT wear cologne, perfume, aftershave, or lotions (deodorant is allowed). Please arrive 15 minutes prior to your appointment time.  Your physician recommends that you schedule a follow-up appointment in: 6 months with an echocardiogram (September ) ** please call the office in July to arrange your follow up appointment. **  If you have any questions or concerns before your next appointment please send [place] a message through [place] or call our office at [phone].    TO LEAVE A MESSAGE FOR THE NURSE SELECT OPTION 2, PLEASE LEAVE A MESSAGE INCLUDING: YOUR NAME DATE OF BIRTH CALL BACK NUMBER REASON FOR CALL**this is important as we prioritize the call backs  YOU WILL RECEIVE A CALL BACK THE SAME DAY AS LONG AS YOU CALL BEFORE 4:00 PM  At the [facility], you and your health needs are our priority. As part of our continuing mission to provide you with exceptional heart care, we have created designated Provider Care Teams. These Care Teams include your primary Cardiologist (physician) and Advanced Practice Providers (APPs- Physician Assistants and Nurse Practitioners) who all work together to provide you with the care you need, when you need it.   You may see any of the following providers on your designated Care Team at your next follow up: Dr Glori Bickers Dr Loralie Champagne Dr. Roxana Hires, NP Lyda Jester,  [place] [facility] [place], [place] Forestine Na, NP Audry Riles, PharmD   Please be sure to bring in all your medications bottles to every appointment.    Thank you for choosing [facility]

## 2022-12-06 NOTE — Progress Notes (Signed)
Date:  12/06/2022   ID:  Christian Bowman, DOB 28-Nov-1945, MRN [id number]   Provider location: [facility] Type of Visit: Established patient   PCP:  Sharilyn Sites, MD  Cardiologist: Dr. Sallyanne Kuster  HF Cardiologist:  Loralie Champagne, MD   History of Present Illness: Christian Bowman is a 77 y.o. male with history of CAD s/p CABG in 2007 then redo CABG in 10/17 with mitral valve replacement.  He was admitted in 10/17 for redo CABG with SVG-PDA and SVG-ramus.  He also had mitral valve replacement with a bioprosthetic valve because of infarct-related mitral regurgitation.  Post-operative course was complicated by CHF requiring diuresis.  He also had atrial flutter and required DCCV.  Due to complete heart block, he later got a CRT-D system.     At a prior visit, he was in atrial flutter.  He saw Dr. Curt Bears, it was decided to arrange for DCCV with ablation down the road when PPM leads have been in longer.  He had successful DCCV in 12/17 and is in NSR today.    He was admitted in 3/18 with NSTEMI and chest pain. TnI only 0.5.  LHC showed occlusion of SVG-PDA from CABG#1 to be the likely culprit.  However, SVG-PDA from CABG#2 was patent.  No intervention.  Echo in 3/18 from [place] showed EF 30-35%, stable bioprosthetic mitral valve.    He had atrial flutter ablation in 8/18.  He is in NSR today and is off amiodarone.     In 11/18, he had an inguinal hernia repair. Post-op, he had an NSTEMI.  LHC showed occlusion of a PLV branch that had been backfilled by SVG-PDA (prior cath had shown severe diffuse disease in the PLV).  Echo in 11/18 showed EF 20-25% with normal bioprosthetic mitral valve.     With no recurrence of atrial arrhythmias, he is now off anticoagulation.   Echo in 7/20 showed EF 25-30%, bioprosthetic MV with no MR and mean gradient 5 mmHg, normal RV.    In 1/21, he developed right hand weakness and dysarthria concerning for CVA.  He went to the ER in [place] and had  tPA.  Symptoms resolved.  Atrial fibrillation was not noted per his wife's report.  CTA head/neck showed no significant carotid stenosis.  He was started on Plavix 75 mg daily and sent home.    CPX (3/21) showed severe functional impairment due to HF.  Echo showed EF 20-25%, normal RV, normally functioning bioprosthetic mitral valve.  RHC in 4/21 showed low filling pressures and low cardiac index (2.1 Fick, 1.9 thermo).  He saw Dr. Orvan Seen to discuss possible lateral thoracotomy access LVAD. He decided against LVAD placement.   Echo in 8/23 showed EF < 20%, mild LV dilation, mildly decreased RV systolic function, PASP 37 mmHg, bioprosthetic mitral valve with mean gradient 5 mmHg and no MR, IVC normal.   Zio monitor in 9/23 showed 8.5% PVCs.  With CRT percentage < 90%, we discussed mexiletine to suppress PVCs but he decided against this.    He returns for followup of CHF.   Weight is stable.  He has stable dyspnea if he walks up stairs or walks a long distance on flat ground.  Still going to maintenance cardiac rehab, does Nustep for 20 minutes at a time.  No recent chest pain.  No orthopnea/PND.  No lightheadedness.   Medtronic device interrogation: No AF/VT, stable thoracic impedance, 92% effective CRT (suspect low due to  PVCs).    ECG (personally reviewed): A-BiV pacing    Labs (11/17): K 4.8, creatinine 1.89, hgb 12.3, digoxin 0.9, LFTs normal, TSH 8.235 (mild increase), free T3 low, free T4 normal, LFTs normal.  Labs (12/17): K 4.5, creatinine 1.7, BNP 3909 Labs (3/18): K 3.9, creatinine 1.48, hgb 11.3 Labs (4/18): digoxin 0.5, LFTs normal Labs (7/18): K 5.1, creatinine 1.78, LDL 63, HDL 29, TSH elevated Labs (11/18): K 4.5, creatinine 1.86, hgb 13.6 Labs (12/18): K 5, creatinine 1.88 Labs (1/19): digoxin 0.3, K 4.7, creatinine 1.83 Labs (6/19): LDL 70, HDL 32, K 5.1, creatinine 1.88 Labs (12/19): LDL 60, HDL 31 Labs (1/20): K 5.1, creatinine 1.64 Labs (4/20): K 4.5, creatinine 1.77 Labs  (8/20): K 4.6, creatinine 1.5 Labs (1/21): K 4.5, creatinine 1.58, LDL 69 Labs (2/21): K 4.5, creatinine 1.66 Labs (4/21): K 4.1, creatinine 1.63, digoxin 0.4 Labs (5/21): K 4.4, creatinine 1.51 Labs (6/21): K 4.4, creatinine 1.51, digoxin 0.3 Labs (12/21): K 4.9, creatinine 1.69 Labs (5/22): K 4.5, creatinine 1.48 Labs (10/22): LDL 71 Labs (1/23): K 4.5, creatinine 1.49 Labs (7/23): K 4, creatinine 1.59 Labs (10/23): K 4.5, creatinine 1.43, LFTs normal   PMH: 1. CAD: CABG 2007.  - LHC (9/17) with patent LIMA-LAD, totally occluded SVG-D, severe stenosis in SVG-PDA.  - Redo CABG 10/17 with SVG-PDA, SVG-ramus and mitral valve replacement.  - NSTEMI 3/18.  LHC with culprit lesion likely occlusion of SVG-PDA from CABG#1.  Patent SVG-PDA from CABG#2.  No intervention.  - NSTEMI 11/18 post-op inguinal hernia repair.  LHC with occlusion of a PLV branch that had been backfilled by SVG-PDA (prior cath had shown severe diffuse disease in the PLV).  2. Mitral regurgitation: Ischemic MR, mitral valve replacement was done in 10/17 (bioprosthetic). 3. Complete heart block: Post-op in 10/17.  Medtronic CRT-D placed.  4. Atrial flutter: DCCV 10/17 and again in 12/17.  - Ablation 8/18, now off amiodarone.  5. Type II diabetes 6. Hyperlipidemia 7. Chronic systolic CHF: Ischemic cardiomyopathy.   - TEE (10/17): EF 20-25%, severe LV dilation - Echo (3/18, [place]): EF 30-35%, bioprosthetic mitral valve with mean gradient 5 mmHg.  - Echo (11/18): EF 20-25%, moderate LV dilation, normal bioprosthetic mitral valve.  - Echo (7/20): EF 25-30%, moderate LV dilation, inferior/inferolateral AK, bioprosthetic MV with mean gradient 5 and no MR, normal RV.  - Painful gynecomastia with spironolactone.  - Echo (1/21): EF < 20%, normal RV size and systolic function, bioprosthetic mitral valve with mild MR, mean gradient 2 mmHg.  - CPX (3/21): peak VO2 11.2, VE/VCO2 slope 56, RER 1.21.  - Echo (3/21): EF 20-25%, no  LV thrombus, RV normal, bioprosthetic mitral valve appear normal.  - RHC (4/21): mean RA 3, PA 25/7, mean PCWP 11, CI 2.1 (Fick), 1.9 (thermo).  - Echo (8/23): EF < 20%, mild LV dilation, mildly decreased RV systolic function, PASP 37 mmHg, bioprosthetic mitral valve with mean gradient 5 mmHg and no MR, IVC normal.  8. Hypothyroidism 9. CVA 1/21: CTA head/neck with no carotid stenosis.  10. PVCs: Zio monitor in 9/23 showed 8.5% PVCs  Current Outpatient Medications  Medication Sig Dispense Refill   aspirin EC 81 MG tablet Take 81 mg by mouth daily.      Blood Glucose Monitoring Suppl (ACCU-CHEK GUIDE ME) w/Device KIT 1 Piece by Does not apply route as directed. 1 kit 0   cholecalciferol (VITAMIN D3) 25 MCG (1000 UNIT) tablet Take 2,000 Units by mouth daily with breakfast.  digoxin (LANOXIN) 0.125 MG tablet Take 1/2 (one-half) tablet by mouth once daily 45 tablet 4   eplerenone (INSPRA) 50 MG tablet Take 1 tablet (50 mg total) by mouth daily. 90 tablet 3   furosemide (LASIX) 20 MG tablet Take 1 tablet (20 mg total) by mouth daily as needed (for a weight over 190 lbs). 30 tablet 3   glucose blood (ACCU-CHEK GUIDE) test strip Use  to monitor glucose once a day at fasting. 100 each 2   levothyroxine (SYNTHROID) 25 MCG tablet TAKE 1 TABLET BY MOUTH EVERY OTHER DAY BEFORE BREAKFAST. ALTERNATING WITH 50 MCG TABLET 45 tablet 0   levothyroxine (SYNTHROID) 50 MCG tablet TAKE 1 TABLET BY MOUTH EVERY OTHER DAY ALTERNATING WITH 25 MCG TABLET 45 tablet 0   neomycin-bacitracin-polymyxin (NEOSPORIN) ointment Apply 1 application  topically daily as needed for wound care.     nitroGLYCERIN (NITROSTAT) 0.4 MG SL tablet DISSOLVE ONE TABLET UNDER THE TONGUE EVERY 5 MINUTES AS NEEDED FOR CHEST PAIN.  DO NOT EXCEED A TOTAL OF 3 DOSES IN 15 MINUTES 25 tablet 2   polyethylene glycol (MIRALAX) 17 g packet Take 17 g by mouth as needed.     rosuvastatin (CRESTOR) 10 MG tablet Take 1 tablet (10 mg total) by mouth daily.  90 tablet 3   sacubitril-valsartan (ENTRESTO) 97-103 MG Take 1 tablet by mouth 2 (two) times daily. 180 tablet 3   sodium zirconium cyclosilicate (LOKELMA) 10 g PACK packet DISSOLVE 1 PACKET IN WATER & DRINK ONCE DAILY 90 each 3   Tetrahydrozoline HCl (VISINE OP) Place 1 drop into both eyes daily as needed (irritation).     amoxicillin (AMOXIL) 500 MG tablet Take 4 tablets (2,000 mg total) by mouth See admin instructions. Take 2000 mg 1 hour prior to dental work 4 tablet 0   carvedilol (COREG) 12.5 MG tablet TAKE 1 & 1/2 (ONE & ONE-HALF) TABLETS BY MOUTH TWICE DAILY WITH A MEAL 180 tablet 3   clopidogrel (PLAVIX) 75 MG tablet Take 1 tablet (75 mg total) by mouth daily. 90 tablet 3   dapagliflozin propanediol (FARXIGA) 10 MG TABS tablet TAKE 1 TABLET BY MOUTH ONCE DAILY BEFORE BREAKFAST **DICOUNTINE TRADJENTA** 90 tablet 3   No current facility-administered medications for this encounter.    Allergies:   Xanax [alprazolam]   Social History:  The patient  reports that he has never smoked. He has never used smokeless tobacco. He reports that he does not drink alcohol and does not use drugs.   Family History:  The patient's family history includes Heart attack in his father and mother; Heart disease in his father, maternal grandmother, and mother; Hypertension in his father and sister.   ROS:  Please see the history of present illness.   All other systems are personally reviewed and negative.   Exam:   BP 130/80   Pulse 70   Wt 86.8 kg (191 lb 6.4 oz)   SpO2 96%   BMI 25.96 kg/m   General: NAD Neck: No JVD, no thyromegaly or thyroid nodule. Large lipoma lateral right neck.  Lungs: Clear to auscultation bilaterally with normal respiratory effort. CV: Nondisplaced PMI.  Heart regular S1/S2, no S3/S4, no murmur.  No peripheral edema.  No carotid bruit.  Normal pedal pulses.  Abdomen: Soft, nontender, no hepatosplenomegaly, no distention.  Skin: Intact without lesions or rashes.  Neurologic:  Alert and oriented x 3.  Psych: Normal affect. Extremities: No clubbing or cyanosis.  HEENT: Normal.   Recent Labs: 08/01/2022:  ALT 16; TSH 4.080 12/06/2022: B Natriuretic Peptide 2,485.3; BUN 27; Creatinine, Ser 1.52; Potassium 4.3; Sodium 139  Personally reviewed   Wt Readings from Last 3 Encounters:  12/06/22 86.8 kg (191 lb 6.4 oz)  10/17/22 86.6 kg (191 lb)  08/28/22 86.5 kg (190 lb 9.6 oz)    ASSESSMENT AND PLAN:  1. Chronic systolic CHF: Ischemic cardiomyopathy.  TEE 10/17 with EF 20-25%.  Echo in 3/21 shoed EF 20-25%, normal RV function, normal bioprosthetic mitral valve.  CPX in 3/21 showed severe functional impairment due to HF with concern for poor short-term prognosis.  RHC in 4/21 showed normal filling pressures but low cardiac index. Last echo in 8/23 with EF < 20%, mild LV dilation, mildly decreased RV systolic function, PASP 37 mmHg, bioprosthetic mitral valve with mean gradient 5 mmHg and no MR, IVC normal.  I talked to him about advanced therapies given poor prognosis with low cardiac output. He is not a transplant candidate.  He has had 2 prior sternotomies, which makes LVAD more complicated.  We have discussed LVAD extensively.  He is very clear that he does not want another cardiac surgery.  He has been stable recently with NYHA class II-III symptoms.  He is not volume overloaded by exam or Optivol.  - Continue to use Lasix prn.    - Continue Entresto 97/103 bid.  - Gynecomastia with spironolactone.  Continue eplerenone 50 mg daily (goal dose).   - Continue Coreg 18.75 mg bid.  He did not tolerate increasing Coreg to 25 mg bid.       - He has been on Lokelma chronically to control hyperkalemia and allow use of eplerenone and Entresto, continue.  BMET/BNP today.  - Continue Farxiga 10 mg daily.    - Continue digoxin 0.0625 daily.  Check digoxin level.  - As he has turned down LVAD, we have discussed baroreceptor activation therapy in the past to help with his symptoms. His  right neck lipoma poses a problem for vagal lead placement, however. He would need the lipoma removed.   - BiV pacing percentage is higher today at 92% (PVCs lower the percentage most likely).  He has not wanted mexiletine to suppress PVCs.  - Repeat echo at followup in 6 months.  2. CAD: s/p redo CABG.  Admission in 3/18 with NSTEMI, LHC showed occlusion of SVG-PDA from CABG#1 but patent SVG-PDA from CABG#2, no intervention.  He had a post-op NSTEMI after inguinal hernia repair in 11/18.  LHC showed occlusion of a PLV branch that had been backfilled by SVG-PDA (prior cath had shown severe diffuse disease in the PLV). Minimal recent chest pain, atypical (related to emotional upset).      - Continue statin => Crestor 10 mg daily.     - Continue ASA 81. 3. Bioprosthetic mitral valve: Stable on 8/23 echo.   - Needs antibiotic prophylaxis with dental work.  4. Atrial flutter: s/p ablation in 8/18.  He is in NSR and off amiodarone.   - Now off warfarin with flutter ablation and no recurrence. If he has recurrence of atrial arrhythmias, based on most recent data DOAC would be a reasonable choice for him even with bioprosthetic MV.  5. CKD: Stage III.  BMET.   6. Type II diabetes: Followed by endocrinology. 7. CVA: In 1/21, got tPA at the ER in [place].  Symptoms resolved.  CTA head/neck showed no carotid stenosis.  Device interrogation has shown no atrial fibrillation. If atrial fibrillation is noted on future  monitoring, will need anticoagulation.  - Continue ASA and Plavix 75 daily.  - Continue Crestor.  8. PVCs: Effective BiV pacing 92% on device check today.  Suspect this is low due to PVCs though better than last interrogation.  He decided against treatment with mexiletine.   Followup in 6 months with echo.   Signed, Loralie Champagne, MD  12/06/2022  [facility] [address] [facility] [place] [address] [phone] (office) [phone]  (fax)

## 2022-12-30 ENCOUNTER — Other Ambulatory Visit: Payer: Self-pay | Admitting: "Endocrinology

## 2023-01-01 ENCOUNTER — Ambulatory Visit: Payer: Medicare Other | Attending: Cardiology

## 2023-01-01 DIAGNOSIS — I5022 Chronic systolic (congestive) heart failure: Secondary | ICD-10-CM | POA: Diagnosis not present

## 2023-01-01 DIAGNOSIS — Z95810 Presence of automatic (implantable) cardiac defibrillator: Secondary | ICD-10-CM

## 2023-01-03 ENCOUNTER — Telehealth: Payer: Self-pay

## 2023-01-03 NOTE — Progress Notes (Signed)
EPIC Encounter for ICM Monitoring  Patient Name: Christian Bowman is a 76 y.o. male Date: 01/03/2023 Primary Care Physican: Sharilyn Sites, MD Primary Cardiologist: [facility] Electrophysiologist: Curt Bears Bi-V Pacing: 92.1%            05/28/2022 Weight: 188.6 lbs 05/29/2022 Weight: 186.1 lbs  07/04/2022 Weight: 183-185 lbs 08/16/2022 Weight: 185 lbs    09/21/2022 Weight: 183.3 lbs   12/06/2022 Office Weight: 191 lbs   Time in AT/AF <0.1 hr/day (<0.1%)     Battery Longevity: 7 months                                                 Attempted call to wife/patient and unable to reach.  Left detailed message per DPR regarding transmission. Transmission reviewed.    Diet: Wife records sodium intake by reviewing food labels.     Optivol thoracic impedance suggesting normal fluid levels.    Prescribed:  Furosemide 20 mg Take 1 tablet (20 mg total) by mouth daily as needed (for a weight over 190 lbs).   Labs: 12/06/2022 Creatinine 1.52, BUN 27, Potassium 4.3, Sodium 139, GFR 47  A complete set of results can be found in Results Review.  Recommendations:  Left voice mail with ICM number and encouraged to call if experiencing any fluid symptoms.   Follow-up plan: ICM clinic phone appointment on 02/05/2023.   91 day device clinic remote transmission 01/23/2023.     EP/Cardiology Office Visits: Recall 06/04/2023 with Dr. Aundra Dubin.   03/22/2023 with Dr Sallyanne Kuster.   Recall 10/12/2023 with Dr Curt Bears.   Copy of ICM check sent to Dr. Curt Bears.  3 month ICM trend: 01/01/2023.    12-14 Month ICM trend:     Rosalene Billings, RN 01/03/2023 5:00 PM

## 2023-01-03 NOTE — Telephone Encounter (Signed)
Remote ICM transmission received.  Attempted call to wife/patient regarding ICM remote transmission and no answer. °

## 2023-01-05 NOTE — Progress Notes (Addendum)
Spoke with wife and heart failure questions reviewed.  Transmission results reviewed.  Pt asymptomatic for fluid accumulation.  Reports feeling well at this time and voices no complaints.  He continues cardiac rehab twice a week in [place].  There is a nurse and PT on staff and monitored before and after his workout.  He pays $25 monthly for maintenance.   He is doing well and has not complaints.

## 2023-01-23 ENCOUNTER — Ambulatory Visit (INDEPENDENT_AMBULATORY_CARE_PROVIDER_SITE_OTHER): Payer: Medicare Other

## 2023-01-23 DIAGNOSIS — I5022 Chronic systolic (congestive) heart failure: Secondary | ICD-10-CM

## 2023-01-24 LAB — CUP PACEART REMOTE DEVICE CHECK
Battery Remaining Longevity: 7 mo
Battery Voltage: 2.83 V
Brady Statistic AP VP Percent: 71.24 %
Brady Statistic AP VS Percent: 0.97 %
Brady Statistic AS VP Percent: 26.36 %
Brady Statistic AS VS Percent: 1.43 %
Brady Statistic RA Percent Paced: 68.57 %
Brady Statistic RV Percent Paced: 91.26 %
Date Time Interrogation Session: 20240423081155
HighPow Impedance: 59 Ohm
Implantable Lead Connection Status: 753985
Implantable Lead Connection Status: 753985
Implantable Lead Connection Status: 753985
Implantable Lead Implant Date: 20171024
Implantable Lead Implant Date: 20171024
Implantable Lead Implant Date: 20171024
Implantable Lead Location: 753858
Implantable Lead Location: 753859
Implantable Lead Location: 753860
Implantable Lead Model: 4598
Implantable Lead Model: 5076
Implantable Pulse Generator Implant Date: 20171024
Lead Channel Impedance Value: 245.538
Lead Channel Impedance Value: 249.509
Lead Channel Impedance Value: 249.509
Lead Channel Impedance Value: 270.667
Lead Channel Impedance Value: 270.667
Lead Channel Impedance Value: 342 Ohm
Lead Channel Impedance Value: 399 Ohm
Lead Channel Impedance Value: 399 Ohm
Lead Channel Impedance Value: 456 Ohm
Lead Channel Impedance Value: 532 Ohm
Lead Channel Impedance Value: 551 Ohm
Lead Channel Impedance Value: 551 Ohm
Lead Channel Impedance Value: 703 Ohm
Lead Channel Impedance Value: 874 Ohm
Lead Channel Impedance Value: 893 Ohm
Lead Channel Impedance Value: 893 Ohm
Lead Channel Impedance Value: 931 Ohm
Lead Channel Impedance Value: 950 Ohm
Lead Channel Pacing Threshold Amplitude: 0.5 V
Lead Channel Pacing Threshold Amplitude: 1 V
Lead Channel Pacing Threshold Amplitude: 1 V
Lead Channel Pacing Threshold Pulse Width: 0.4 ms
Lead Channel Pacing Threshold Pulse Width: 0.4 ms
Lead Channel Pacing Threshold Pulse Width: 1 ms
Lead Channel Sensing Intrinsic Amplitude: 1.75 mV
Lead Channel Sensing Intrinsic Amplitude: 1.75 mV
Lead Channel Sensing Intrinsic Amplitude: 21.5 mV
Lead Channel Sensing Intrinsic Amplitude: 21.5 mV
Lead Channel Setting Pacing Amplitude: 2 V
Lead Channel Setting Pacing Amplitude: 2 V
Lead Channel Setting Pacing Amplitude: 2 V
Lead Channel Setting Pacing Pulse Width: 0.4 ms
Lead Channel Setting Pacing Pulse Width: 1 ms
Lead Channel Setting Sensing Sensitivity: 0.3 mV
Zone Setting Status: 755011
Zone Setting Status: 755011

## 2023-02-01 LAB — T4, FREE: Free T4: 1.4 ng/dL (ref 0.82–1.77)

## 2023-02-01 LAB — COMPREHENSIVE METABOLIC PANEL
ALT: 24 IU/L (ref 0–44)
AST: 20 IU/L (ref 0–40)
Albumin/Globulin Ratio: 1.6 (ref 1.2–2.2)
Albumin: 4.1 g/dL (ref 3.8–4.8)
Alkaline Phosphatase: 71 IU/L (ref 44–121)
BUN/Creatinine Ratio: 23 (ref 10–24)
BUN: 39 mg/dL — ABNORMAL HIGH (ref 8–27)
Bilirubin Total: 0.5 mg/dL (ref 0.0–1.2)
CO2: 23 mmol/L (ref 20–29)
Calcium: 9.4 mg/dL (ref 8.6–10.2)
Chloride: 101 mmol/L (ref 96–106)
Creatinine, Ser: 1.7 mg/dL — ABNORMAL HIGH (ref 0.76–1.27)
Globulin, Total: 2.6 g/dL (ref 1.5–4.5)
Glucose: 149 mg/dL — ABNORMAL HIGH (ref 70–99)
Potassium: 4.8 mmol/L (ref 3.5–5.2)
Sodium: 139 mmol/L (ref 134–144)
Total Protein: 6.7 g/dL (ref 6.0–8.5)
eGFR: 42 mL/min/{1.73_m2} — ABNORMAL LOW (ref >59)

## 2023-02-01 LAB — TSH: TSH: 3.36 u[IU]/mL (ref 0.450–4.500)

## 2023-02-01 LAB — VITAMIN D 25 HYDROXY (VIT D DEFICIENCY, FRACTURES): Vit D, 25-Hydroxy: 38.9 ng/mL (ref 30.0–100.0)

## 2023-02-03 ENCOUNTER — Other Ambulatory Visit (HOSPITAL_COMMUNITY): Payer: Self-pay | Admitting: Cardiology

## 2023-02-05 ENCOUNTER — Ambulatory Visit: Payer: Medicare Other | Attending: Cardiology

## 2023-02-05 DIAGNOSIS — Z95810 Presence of automatic (implantable) cardiac defibrillator: Secondary | ICD-10-CM | POA: Diagnosis not present

## 2023-02-05 DIAGNOSIS — I5022 Chronic systolic (congestive) heart failure: Secondary | ICD-10-CM

## 2023-02-07 ENCOUNTER — Encounter: Payer: Self-pay | Admitting: "Endocrinology

## 2023-02-07 ENCOUNTER — Ambulatory Visit (INDEPENDENT_AMBULATORY_CARE_PROVIDER_SITE_OTHER): Payer: Medicare Other | Admitting: "Endocrinology

## 2023-02-07 VITALS — BP 120/72 | HR 80 | Ht 72.0 in | Wt 189.6 lb

## 2023-02-07 DIAGNOSIS — E039 Hypothyroidism, unspecified: Secondary | ICD-10-CM | POA: Diagnosis not present

## 2023-02-07 DIAGNOSIS — N1831 Chronic kidney disease, stage 3a: Secondary | ICD-10-CM

## 2023-02-07 DIAGNOSIS — E1122 Type 2 diabetes mellitus with diabetic chronic kidney disease: Secondary | ICD-10-CM | POA: Diagnosis not present

## 2023-02-07 DIAGNOSIS — E782 Mixed hyperlipidemia: Secondary | ICD-10-CM

## 2023-02-07 DIAGNOSIS — I10 Essential (primary) hypertension: Secondary | ICD-10-CM | POA: Diagnosis not present

## 2023-02-07 DIAGNOSIS — Z7984 Long term (current) use of oral hypoglycemic drugs: Secondary | ICD-10-CM

## 2023-02-07 LAB — POCT GLYCOSYLATED HEMOGLOBIN (HGB A1C): HbA1c, POC (controlled diabetic range): 7.3 % — AB (ref 0.0–7.0)

## 2023-02-07 MED ORDER — GLIPIZIDE ER 2.5 MG PO TB24: 2.5000 mg | ORAL_TABLET | Freq: Every day | ORAL | 1 refills | Status: DC

## 2023-02-07 MED ORDER — LEVOTHYROXINE SODIUM 75 MCG PO TABS: 37.5000 ug | ORAL_TABLET | Freq: Every day | ORAL | 1 refills | Status: DC

## 2023-02-07 NOTE — Progress Notes (Unsigned)
02/08/2023          Endocrinology follow-up note   Subjective:    Patient ID: Christian Bowman, male    DOB: 07/16/1946,    Past Medical History:  Diagnosis Date   AICD (automatic cardioverter/defibrillator) present    medtronic-   DR. CROITORU , DR. Shirlee Latch    Anginal pain (HCC)    cp sat 08/11/17   Anxiety    CAD (coronary artery disease)    CHF (congestive heart failure) (HCC)    Complication of anesthesia    took awhile to wake up    Coronary artery disease involving coronary bypass graft    Cyst of neck    right side   DM2 (diabetes mellitus, type 2) (HCC) 08/26/2013   Dyspnea    Heart attack (HCC)    "not sure when" (08/20/2017)   HTN (hypertension) 08/26/2013   Hyperlipidemia 08/26/2013   Hypothyroidism    Left main coronary artery disease    Left renal artery stenosis (HCC)    Genesis 6x12 stent 2007   Obesity (BMI 30.0-34.9) 08/26/2013   Postoperative atrial fibrillation (HCC) 10/15/2005   Presence of permanent cardiac pacemaker    S/P CABG x 4 10/13/2005   LIMA to LAD, SVG to intermediate branch, sequential SVG to PDA and RPL branch, EVH via right thigh   S/P mitral valve replacement with bioprosthetic valve 07/11/2016   31 mm [facility] Mitral bovine bioprosthetic tissue valve   S/P redo CABG x 2 07/11/2016   SVG to PDA and SVG to Intermediate Branch, EVH via left thigh   Past Surgical History:  Procedure Laterality Date   A-FLUTTER ABLATION N/A 05/11/2017   Procedure: A-Flutter Ablation;  Surgeon: Regan Lemming, MD;  Location: [facility] INVASIVE CV LAB;  Service: Cardiovascular;  Laterality: N/A;   CARDIAC CATHETERIZATION N/A 06/21/2016   Procedure: Right/Left Heart Cath and Coronary/Graft Angiography;  Surgeon: Tonny Bollman, MD;  Location: [facility] INVASIVE CV LAB;  Service: Cardiovascular;  Laterality: N/A;   CARDIAC VALVE REPLACEMENT     CARDIOVERSION N/A 07/19/2016   Procedure: CARDIOVERSION;  Surgeon: Lewayne Bunting, MD;  Location: [facility] ENDOSCOPY;  Service:  Cardiovascular;  Laterality: N/A;   CARDIOVERSION N/A 09/08/2016   Procedure: CARDIOVERSION;  Surgeon: Pricilla Riffle, MD;  Location: [facility] ENDOSCOPY;  Service: Cardiovascular;  Laterality: N/A;   CORONARY ANGIOPLASTY     STENT 2016  [facility]   CORONARY ANGIOPLASTY WITH STENT PLACEMENT     DES in SVG to right coronary artery system   CORONARY ARTERY BYPASS GRAFT  10/13/2005   LIMA to LAD, SVG to intermediate branch, sequential SVG to PDA and RPL   CORONARY ARTERY BYPASS GRAFT N/A 07/11/2016   Procedure: REDO CORONARY ARTERY BYPASS GRAFTING (CABG) x two using left leg greater saphenous vein harvested endoscopically-SVG to PDA -SVG to RAMUS INTERMEDIATE;  Surgeon: Purcell Nails, MD;  Location: [facility] OR;  Service: Open Heart Surgery;  Laterality: N/A;   CORONARY ARTERY BYPASS GRAFT N/A 07/11/2016   Procedure: Re-exploration (CABG) for post op bleeding,;  Surgeon: Purcell Nails, MD;  Location: [facility] OR;  Service: Open Heart Surgery;  Laterality: N/A;   EP IMPLANTABLE DEVICE N/A 07/25/2016   Procedure: BiV ICD Insertion CRT-D;  Surgeon: Will Jorja Loa, MD;  Location: [facility] INVASIVE CV LAB;  Service: Cardiovascular;  Laterality: N/A;   INGUINAL HERNIA REPAIR Left 08/20/2017   INGUINAL HERNIA REPAIR Left 08/20/2017   Procedure: OPEN LEFT INGUINAL HERNIA REPAIR;  Surgeon: Ovidio Kin, MD;  Location: [facility] OR;  Service: General;  Laterality: Left;   LEFT HEART CATH AND CORS/GRAFTS ANGIOGRAPHY N/A 12/18/2016   Procedure: Left Heart Cath and Cors/Grafts Angiography;  Surgeon: Tonny Bollman, MD;  Location: [facility] INVASIVE CV LAB;  Service: Cardiovascular;  Laterality: N/A;   LEFT HEART CATH AND CORS/GRAFTS ANGIOGRAPHY N/A 08/22/2017   Procedure: LEFT HEART CATH AND CORS/GRAFTS ANGIOGRAPHY;  Surgeon: Laurey Morale, MD;  Location: [facility] INVASIVE CV LAB;  Service: Cardiovascular;  Laterality: N/A;   MITRAL VALVE REPLACEMENT N/A 07/11/2016   Procedure: MITRAL VALVE (MV) REPLACEMENT;  Surgeon: Purcell Nails,  MD;  Location: [facility] OR;  Service: Open Heart Surgery;  Laterality: N/A;   MYOCARDICAL PERFUSION  10/09/2007   NORMAL PERFUSION IN ALL REGIONS;NO EVIDENCE OF INDUCIBLE ISCHEMIA;POST STRESS EF% 66   RENAL ARTERY STENT Right 2007   RENAL DOPPLER  03/28/2010   RIGHT RA-NORMAL;LEFT PROXIMAL RA AT STENT-PATENT WITH NO EVIDENCE OF SIGN DIAMETER REDUCTION. R & L KIDNEYS: EQUAL IN SIZE,SYMMETRICAL IN SHAPE.   RIGHT HEART CATH N/A 01/08/2020   Procedure: RIGHT HEART CATH;  Surgeon: Laurey Morale, MD;  Location: [facility] INVASIVE CV LAB;  Service: Cardiovascular;  Laterality: N/A;   TEE WITHOUT CARDIOVERSION N/A 06/15/2016   Procedure: TRANSESOPHAGEAL ECHOCARDIOGRAM (TEE);  Surgeon: Thurmon Fair, MD;  Location: [facility] ENDOSCOPY;  Service: Cardiovascular;  Laterality: N/A;   TEE WITHOUT CARDIOVERSION N/A 07/11/2016   Procedure: TRANSESOPHAGEAL ECHOCARDIOGRAM (TEE);  Surgeon: Purcell Nails, MD;  Location: [facility] OR;  Service: Open Heart Surgery;  Laterality: N/A;   TEE WITHOUT CARDIOVERSION N/A 07/19/2016   Procedure: TRANSESOPHAGEAL ECHOCARDIOGRAM (TEE);  Surgeon: Lewayne Bunting, MD;  Location: [facility] ENDOSCOPY;  Service: Cardiovascular;  Laterality: N/A;   TRANSESOPHAGEAL ECHOCARDIOGRAM  10/19/2005   NORMAL LV; MILD TO MODERATE AMOUNT OF SOFT ATHEROMATOUS PLAQUE OF THE THORACIC AORTA; THE LEFT ATRIUM IS MILDLY DILATED;LEFT ATRIAL APPENDAGE FUNCTION IS NORMAL;NO THROMBUS IDENTIFIED. SMALL PFO WITH RIGHT TO LEFT SHUNT   Social History   Socioeconomic History   Marital status: Married    Spouse name: Not on file   Number of children: 0   Years of education: 18   Highest education level: Not on file  Occupational History   Occupation: retired  Tobacco Use   Smoking status: Never   Smokeless tobacco: Never  Vaping Use   Vaping Use: Never used  Substance and Sexual Activity   Alcohol use: No   Drug use: No   Sexual activity: Never  Other Topics Concern   Not on file  Social History Narrative   Right handed    Two story home   Drinks half and half coffee   Social Determinants of Health   Financial Resource Strain: Not on file  Food Insecurity: Not on file  Transportation Needs: Not on file  Physical Activity: Not on file  Stress: Not on file  Social Connections: Not on file   Outpatient Encounter Medications as of 02/07/2023  Medication Sig   glipiZIDE (GLUCOTROL XL) 2.5 MG 24 hr tablet Take 1 tablet (2.5 mg total) by mouth daily with breakfast.   levothyroxine (SYNTHROID) 75 MCG tablet Take 0.5 tablets (37.5 mcg total) by mouth daily before breakfast.   amoxicillin (AMOXIL) 500 MG tablet Take 4 tablets (2,000 mg total) by mouth See admin instructions. Take 2000 mg 1 hour prior to dental work   aspirin EC 81 MG tablet Take 81 mg by mouth daily.    Blood Glucose Monitoring Suppl (ACCU-CHEK GUIDE ME) w/Device KIT 1 Piece  by Does not apply route as directed.   carvedilol (COREG) 12.5 MG tablet TAKE 1 & 1/2 (ONE & ONE-HALF) TABLETS BY MOUTH TWICE DAILY WITH A MEAL   cholecalciferol (VITAMIN D3) 25 MCG (1000 UNIT) tablet Take 2,000 Units by mouth daily with breakfast.   clopidogrel (PLAVIX) 75 MG tablet Take 1 tablet (75 mg total) by mouth daily.   dapagliflozin propanediol (FARXIGA) 10 MG TABS tablet TAKE 1 TABLET BY MOUTH ONCE DAILY BEFORE BREAKFAST **DICOUNTINE TRADJENTA**   digoxin (LANOXIN) 0.125 MG tablet Take 1/2 (one-half) tablet by mouth once daily   eplerenone (INSPRA) 50 MG tablet Take 1 tablet (50 mg total) by mouth daily.   furosemide (LASIX) 20 MG tablet Take 1 tablet (20 mg total) by mouth daily as needed (for a weight over 190 lbs).   glucose blood (ACCU-CHEK GUIDE) test strip Use  to monitor glucose once a day at fasting.   neomycin-bacitracin-polymyxin (NEOSPORIN) ointment Apply 1 application  topically daily as needed for wound care.   nitroGLYCERIN (NITROSTAT) 0.4 MG SL tablet DISSOLVE ONE TABLET UNDER THE TONGUE EVERY 5 MINUTES AS NEEDED FOR CHEST PAIN.  DO NOT EXCEED A TOTAL OF 3  DOSES IN 15 MINUTES   polyethylene glycol (MIRALAX) 17 g packet Take 17 g by mouth as needed.   rosuvastatin (CRESTOR) 10 MG tablet Take 1 tablet (10 mg total) by mouth daily.   sacubitril-valsartan (ENTRESTO) 97-103 MG Take 1 tablet by mouth twice daily   sodium zirconium cyclosilicate (LOKELMA) 10 g PACK packet DISSOLVE 1 PACKET IN WATER & DRINK ONCE DAILY   Tetrahydrozoline HCl (VISINE OP) Place 1 drop into both eyes daily as needed (irritation).   [DISCONTINUED] levothyroxine (SYNTHROID) 25 MCG tablet TAKE 1 TABLET BY MOUTH EVERY OTHER DAY BEFORE BREAKFAST ALTERNATING WITH 50 MCG TABLET   [DISCONTINUED] levothyroxine (SYNTHROID) 50 MCG tablet TAKE 1 TABLET BY MOUTH EVERY OTHER DAY ALTERNATING WITH 25 MCG TABLET   No facility-administered encounter medications on file as of 02/07/2023.   ALLERGIES: Allergies  Allergen Reactions   Xanax [Alprazolam] Other (See Comments)    Pt feels very weak, tired and feels paralyzed     VACCINATION STATUS: Immunization History  Administered Date(s) Administered   Fluad Quad(high Dose 65+) 07/28/2019   Influenza,inj,Quad PF,6+ Mos 08/01/2016, 07/11/2017   Pneumococcal Polysaccharide-23 07/02/2015    Diabetes He presents for his follow-up diabetic visit. He has type 2 diabetes mellitus. Onset time: He was diagnosed at approximate age of 73 years. His disease course has been fluctuating. There are no hypoglycemic associated symptoms. Pertinent negatives for hypoglycemia include no confusion, headaches, pallor or seizures. There are no diabetic associated symptoms. Pertinent negatives for diabetes include no chest pain, no fatigue, no polydipsia, no polyphagia, no polyuria and no weakness. There are no hypoglycemic complications. Symptoms are improving. Diabetic complications include heart disease. Risk factors for coronary artery disease include diabetes mellitus, dyslipidemia, hypertension, male sex and sedentary lifestyle. He is compliant with treatment  most of the time. His weight is decreasing steadily. He participates in exercise intermittently. His home blood glucose trend is fluctuating minimally. (His point-of-care A1c is improving to 7.2% from 7.6%.  He did not document or report hypoglycemia.   ) An ACE inhibitor/angiotensin II receptor blocker is being taken.  Hyperlipidemia This is a chronic problem. The current episode started more than 1 year ago. Exacerbating diseases include diabetes. Pertinent negatives include no chest pain, myalgias or shortness of breath. Current antihyperlipidemic treatment includes statins. Risk factors for coronary artery  disease include diabetes mellitus, dyslipidemia, hypertension and male sex.  Hypertension This is a chronic problem. The current episode started more than 1 year ago. Pertinent negatives include no chest pain, headaches, neck pain, palpitations or shortness of breath. Risk factors for coronary artery disease include diabetes mellitus and dyslipidemia. Hypertensive end-organ damage includes CAD/MI. Identifiable causes of hypertension include a thyroid problem.  Thyroid Problem Presents for follow-up visit. Patient reports no constipation, diarrhea, fatigue or palpitations. (Remains on levothyroxine 75 mcg p.o. nightly.  He is compliant.  He has no new complaints today.) The symptoms have been stable. His past medical history is significant for diabetes and hyperlipidemia.    Review of systems  Constitutional: + Steady body weight 200 pounds,   Body mass index is 25.71 kg/m. , no fatigue, no subjective hyperthermia, no subjective hypothermia    Objective:    BP 120/72   Pulse 80   Ht 6' (1.829 m)   Wt 189 lb 9.6 oz (86 kg)   BMI 25.71 kg/m   Wt Readings from Last 3 Encounters:  02/07/23 189 lb 9.6 oz (86 kg)  12/06/22 191 lb 6.4 oz (86.8 kg)  10/17/22 191 lb (86.6 kg)     Physical Exam- Limited  Constitutional:  Body mass index is 25.71 kg/m. , not in acute distress, normal  state of mind Eyes:  EOMI, no exophthalmos   Chemistry (most recent): Lab Results  Component Value Date   NA 139 01/31/2023   K 4.8 01/31/2023   CL 101 01/31/2023   CO2 23 01/31/2023   BUN 39 (H) 01/31/2023   CREATININE 1.70 (H) 01/31/2023     Lipid Panel     Component Value Date/Time   CHOL 129 05/31/2022 1142   CHOL 120 10/01/2018 0925   TRIG 81 05/31/2022 1142   HDL 37 (L) 05/31/2022 1142   HDL 31 (L) 10/01/2018 0925   CHOLHDL 3.5 05/31/2022 1142   VLDL 16 05/31/2022 1142   LDLCALC 76 05/31/2022 1142   LDLCALC 60 10/01/2018 0925   LABVLDL 29 10/01/2018 0925   A1c today 7.3%.  Assessment & Plan:   1. Type 2 diabetes mellitus with other circulatory complication (HCC), stage 3 CKD   His diabetes is  complicated by coronary artery disease status post coronary artery bypass graft and recent ACS and patient remains at a high risk for more acute and chronic complications of diabetes which include CAD, CVA, CKD, retinopathy, and neuropathy. These are all discussed in detail with the patient.  His point-of-care A1c is stable at 7.3%.  He did not document or report hypoglycemia.     His most recent CMP still consistent with renal insufficiency- does have history of stage 2-3 renal insufficiency. - Suggestion is made for him to avoid simple carbohydrates  from his diet including Cakes, Sweet Desserts, Ice Cream, Soda (diet and regular), Sweet Tea, Candies, Chips, Cookies, Store Bought Juices, Alcohol in Excess of  1-2 drinks a day, Artificial Sweeteners,  Coffee Creamer, and "Sugar-free" Products, Lemonade. This will help patient to have more stable blood glucose profile and potentially avoid unintended weight gain.  - Patient is advised to stick to a routine mealtimes to eat 3 meals  a day and avoid unnecessary snacks ( to snack only to correct hypoglycemia).   - I have approached patient with the following individualized plan to manage diabetes and patient agrees.  -He will   need additional intervention for diabetes, advised to continue Farxiga 10 mg p.o. daily at  breakfast.   I discussed and added glipizide 2.5mg  XL daily at breakfast. He is advised to monitor glucose daily at fasting and report if < 70 or > 200.  - Patient specific target  for A1c; LDL, HDL, Triglycerides, were discussed in detail.  2) BP/HTN -His blood pressure is controlled to target.  He is currently on carvedilol 12.5 mg p.o. twice daily, eplerenone 50 mg p.o. daily, as well as Entresto 97-103 mg p.o. daily.   He could be considered for low-dose of Entresto.  3) Lipids/HPL: His most recent lipid panel showed LDL of 76.  He is advised to continue Crestor 10 mg p.o. nightly.    Side effects and precautions discussed with him.    4)  Weight/Diet: His BMI is 27.04-  -a candidate for some weight loss.  CDE consult in progress, exercise, and carbohydrates information provided.  5) hypothyroidism: - This is related to his therapy with amiodarone. His previsit thyroid function tests are consistent with appropriate replacement. He will be continued on daily dose of 37.5 mcg levothyroxine.   - We discussed about the correct intake of his thyroid hormone, on empty stomach at fasting, with water, separated by at least 30 minutes from breakfast and other medications,  and separated by more than 4 hours from calcium, iron, multivitamins, acid reflux medications (PPIs). -Patient is made aware of the fact that thyroid hormone replacement is needed for life, dose to be adjusted by periodic monitoring of thyroid function tests.    6) vitamin D deficiency: He has responded to vitamin D supplement with correction of his 25-hydroxy vitamin D to 40.  He is advised to continue vitamin D3 2000 units daily.   7) Chronic Care/Health Maintenance:  -Patient is  on ACEI/ARB and Statin medications and encouraged to continue to follow up with Ophthalmology, Podiatrist at least yearly or according to recommendations,  and advised to  stay away from smoking. I have recommended yearly flu vaccine and pneumonia vaccination at least every 5 years, and  sleep for at least 7 hours a day.  This patient may benefit from screening bone density.  I advised patient to maintain close follow up with his cardiologist and his  PCP for primary care needs.   I spent  26 minutes in the care of the patient today including review of labs from Thyroid Function, CMP, and other relevant labs ; imaging/biopsy records (current and previous including abstractions from other facilities); face-to-face time discussing  his lab results and symptoms, medications doses, his options of short and long term treatment based on the latest standards of care / guidelines;   and documenting the encounter.  Christian Bowman  participated in the discussions, expressed understanding, and voiced agreement with the above plans.  All questions were answered to his satisfaction. he is encouraged to contact clinic should he have any questions or concerns prior to his return visit.     Follow up plan: Return in about 6 months (around 08/10/2023) for F/U with Pre-visit Labs, Meter/CGM/Logs, A1c here.  Marquis Lunch, MD Phone: [phone]  Fax: [phone]   This note was partially dictated with voice recognition software. Similar sounding words can be transcribed inadequately or may not  be corrected upon review.  02/08/2023, 6:24 AM

## 2023-02-07 NOTE — Progress Notes (Signed)
EPIC Encounter for ICM Monitoring  Patient Name: Christian Bowman is a 77 y.o. male Date: 02/07/2023 Primary Care Physican: Assunta Found, MD Primary Cardiologist: Croitoru/McLean Electrophysiologist: Elberta Fortis Bi-V Pacing: 93.1%            05/28/2022 Weight: 188.6 lbs 05/29/2022 Weight: 186.1 lbs  07/04/2022 Weight: 183-185 lbs 08/16/2022 Weight: 185 lbs    09/21/2022 Weight: 183.3 lbs   12/06/2022 Office Weight: 191 lbs    Time in AT/AF  <0.1 hr/day (<0.1%)     Battery Longevity: 7 months                                                 Spoke with wife and heart failure questions reviewed.  Transmission results reviewed.  Pt asymptomatic for fluid accumulation.  Reports feeling well at this time and voices no complaints.   He has been having slight dizziness the last couple of days and trouble sleeping for the last few nights.    Diet: Wife records sodium intake by reviewing food labels.     Optivol thoracic impedance suggesting normal fluid levels.    Prescribed:  Furosemide 20 mg Take 1 tablet (20 mg total) by mouth daily as needed (for a weight over 190 lbs).   Labs: 12/06/2022 Creatinine 1.52, BUN 27, Potassium 4.3, Sodium 139, GFR 47  A complete set of results can be found in Results Review.   Recommendations:  No changes and encouraged to call if patient experiences any fluid symptoms.   Follow-up plan: ICM clinic phone appointment on 03/12/2023.   91 day device clinic remote transmission 04/24/2023.     EP/Cardiology Office Visits: Recall 06/04/2023 with Dr. Shirlee Latch.   03/22/2023 with Dr Royann Shivers.   Recall 10/12/2023 with Dr Elberta Fortis.   Copy of ICM check sent to Dr. Elberta Fortis.  3 month ICM trend: 02/05/2023.    12-14 Month ICM trend:     [name], RN 02/07/2023 10:07 AM

## 2023-02-07 NOTE — Patient Instructions (Signed)

## 2023-02-17 ENCOUNTER — Other Ambulatory Visit (HOSPITAL_COMMUNITY): Payer: Self-pay | Admitting: Cardiology

## 2023-02-19 ENCOUNTER — Encounter: Payer: Medicare Other | Admitting: Cardiovascular Disease

## 2023-02-21 NOTE — Progress Notes (Signed)
Remote ICD transmission.   

## 2023-03-12 ENCOUNTER — Ambulatory Visit: Payer: Medicare Other | Attending: Cardiology

## 2023-03-12 DIAGNOSIS — I5022 Chronic systolic (congestive) heart failure: Secondary | ICD-10-CM

## 2023-03-12 DIAGNOSIS — Z95810 Presence of automatic (implantable) cardiac defibrillator: Secondary | ICD-10-CM

## 2023-03-14 NOTE — Progress Notes (Signed)
EPIC Encounter for ICM Monitoring  Patient Name: Christian Bowman is a 77 y.o. male Date: 03/14/2023 Primary Care Physican: Assunta Found, MD Primary Cardiologist: Croitoru/McLean Electrophysiologist: Elberta Fortis Bi-V Pacing: 94.2%            05/28/2022 Weight: 188.6 lbs 05/29/2022 Weight: 186.1 lbs  07/04/2022 Weight: 183-185 lbs 08/16/2022 Weight: 185 lbs    09/21/2022 Weight: 183.3 lbs   12/06/2022 Office Weight: 191 lbs    Time in AT/AF  <0.1 hr/day (<0.1%)     Battery Longevity: 5 months                                                 Attempted call to wife/patient and unable to reach.  Left detailed message per DPR regarding transmission. Transmission reviewed.    Diet: Wife records sodium intake by reviewing food labels.     Optivol thoracic impedance suggesting intermittent days with possible fluid accumulation within the last month.    Prescribed:  Furosemide 20 mg Take 1 tablet (20 mg total) by mouth daily as needed (for a weight over 190 lbs).   Labs: 12/06/2022 Creatinine 1.52, BUN 27, Potassium 4.3, Sodium 139, GFR 47  A complete set of results can be found in Results Review.   Recommendations:  Left voice mail with ICM number and encouraged to call if experiencing any fluid symptoms.   Follow-up plan: ICM clinic phone appointment on 04/16/2023.   91 day device clinic remote transmission 04/24/2023.     EP/Cardiology Office Visits: Recall 06/04/2023 with Dr. Shirlee Latch.   03/22/2023 with Dr Royann Shivers.   Recall 10/12/2023 with Dr Elberta Fortis.   Copy of ICM check sent to Dr. Elberta Fortis.  3 month ICM trend: 03/12/2023.    12-14 Month ICM trend:     [name], RN 03/14/2023 12:50 PM

## 2023-03-22 ENCOUNTER — Encounter: Payer: Self-pay | Admitting: Cardiovascular Disease

## 2023-03-22 ENCOUNTER — Ambulatory Visit: Payer: Medicare Other | Attending: Cardiovascular Disease | Admitting: Cardiovascular Disease

## 2023-03-22 VITALS — BP 122/72 | HR 81 | Ht 72.0 in | Wt 193.4 lb

## 2023-03-22 DIAGNOSIS — I493 Ventricular premature depolarization: Secondary | ICD-10-CM | POA: Diagnosis present

## 2023-03-22 DIAGNOSIS — Z7984 Long term (current) use of oral hypoglycemic drugs: Secondary | ICD-10-CM

## 2023-03-22 DIAGNOSIS — E785 Hyperlipidemia, unspecified: Secondary | ICD-10-CM | POA: Diagnosis present

## 2023-03-22 DIAGNOSIS — I10 Essential (primary) hypertension: Secondary | ICD-10-CM

## 2023-03-22 DIAGNOSIS — N1832 Chronic kidney disease, stage 3b: Secondary | ICD-10-CM

## 2023-03-22 DIAGNOSIS — E1122 Type 2 diabetes mellitus with diabetic chronic kidney disease: Secondary | ICD-10-CM

## 2023-03-22 DIAGNOSIS — I5042 Chronic combined systolic (congestive) and diastolic (congestive) heart failure: Secondary | ICD-10-CM | POA: Diagnosis present

## 2023-03-22 DIAGNOSIS — Z953 Presence of xenogenic heart valve: Secondary | ICD-10-CM | POA: Diagnosis not present

## 2023-03-22 DIAGNOSIS — Z8679 Personal history of other diseases of the circulatory system: Secondary | ICD-10-CM

## 2023-03-22 DIAGNOSIS — Z95810 Presence of automatic (implantable) cardiac defibrillator: Secondary | ICD-10-CM | POA: Diagnosis present

## 2023-03-22 DIAGNOSIS — I2510 Atherosclerotic heart disease of native coronary artery without angina pectoris: Secondary | ICD-10-CM | POA: Diagnosis not present

## 2023-03-22 NOTE — Patient Instructions (Signed)
Medication Instructions:  No changes *If you need a refill on your cardiac medications before your next appointment, please call your pharmacy*  Follow-Up: At [facility], you and your health needs are our priority.  As part of our continuing mission to provide you with exceptional heart care, we have created designated Provider Care Teams.  These Care Teams include your primary Cardiologist (physician) and Advanced Practice Providers (APPs -  Physician Assistants and Nurse Practitioners) who all work together to provide you with the care you need, when you need it.  We recommend signing up for the patient portal called "MyChart".  Sign up information is provided on this After Visit Summary.  MyChart is used to connect with patients for Virtual Visits (Telemedicine).  Patients are able to view lab/test results, encounter notes, upcoming appointments, etc.  Non-urgent messages can be sent to your provider as well.   To learn more about what you can do with MyChart, go to [email or web address].    Your next appointment:    5 month f/u with Dr Elberta Fortis- battery close to ERI  8 month f/u with Dr Royann Shivers

## 2023-03-22 NOTE — Progress Notes (Signed)
Cardiology Office Note    Date:  03/22/2023   ID:  Hakeen, Puleio [birth year], MRN [id number]  PCP:  Assunta Found, MD  Cardiologist:  Marca Ancona, MD; Loman Brooklyn, MD; Thurmon Fair, MD   Chief Complaint  Patient presents with   Pacemaker Check   Congestive Heart Failure   Cardiac Valve Problem     History of Present Illness:  Christian Bowman is a 77 y.o. male with extensive and severe cardiac problems.  He has severe cardiomyopathy with LVEF of 20-25% (<20% by echo Aug 2023) due to both ischemic cardiomyopathy and valvular heart disease.  He initially had a CABG in 2007, redo CABG in October 2017 when he also received a mitral valve bioprosthesis (Edwards Magna 31 mm, implanted 2017).  He has a dual-chamber biventricular defibrillator (Medtronic) also implanted in 2017.  He has a history of atrial flutter with successful ablation and has been off anticoagulation. In January 2021 he had transient right hand weakness and dysarthria and received thrombolytics at the emergency room in [place].  His device did not show atrial arrhythmia and echocardiogram with contrast did not show LV thrombus.  He was discharged on clopidogrel.  He was last hospitalized for heart failure exacerbation in [place] on April 17, 2022.     CT angiogram of the chest July 2023 showed a 17 mm paramediastinal mass. The follow up CT 07/06/2022 shows it has enlarged to 24x12 mm. The next CT 11/01/2022 showed stable findings and favored "dystrophic calcification and/or pledget or hemostatic material".  He feels well.  He continues to exercise at the gym regularly and has not had to curtail his routine.  He denies exertional dyspnea, orthopnea, PND, lower extremity edema.  He has not had angina at rest or with activity.  He denies palpitations or syncope but does have occasional symptoms of orthostatic hypotension/dizziness.  Device interrogation shows 72% atrial pacing, only 93% biventricular pacing (91.4% effective  pacing).  This appears to be due to PVCs.  He has not had atrial fibrillation.  His OptiVol/thoracic impedance has been in normal range for the last 12 months.  He has had only 2 episodes of nonsustained ventricular tachycardia, 7-8 beats, both occurring in March.  ECG was not performed today, but the tracing from 12/06/2022 shows AV pacing with atrial bigeminy and consistent BiV pacing.  His device is approaching ERI anticipated to occur in the next 5 months.  He is on maximum tolerated doses of Entresto and carvedilol as well as eplerenone (gynecomastia on spironolactone) and furosemide.  He is taking an aspirin and clopidogrel and is on a highly effective statin.  Most recent LDL cholesterol was 76 but has a chronically low HDL at 37.  Hemoglobin A1c last month was slightly worse than previously at 7.3%. (sees Dr. Fransico Him).  Dig level checked in March was 0.4.  His right submandibular mass continues to slowly increase in size.  It does not really bother him except for cosmetic reasons, but it did become an issue when a barostim device was considered (that was later abandoned).    He underwent redo coronary artery bypass 2 (SVG to PDA, SVG to ramus), as well as mitral valve replacement (bioprosthetic Edwards magna 31 mm) on 07/11/2016, complicated by AV block and atrial flutter requiring cardioversion. Estimated EF 20-25 % on his 07/18/16 TEE. A biventricular defibrillator (Medtronic Claria MRI CRT-D) was implanted on July 25, 2016. On December 18, 2016 he was hospitalized for small non-STEMI and was found  to have interval occlusion of the old SVG to PDA, while the more recently placed SVG was still patent. LVEDP was quite low at only 3 mmHg during that catheterization. He had atrial flutter ablation in August 2018 and is off anticoagulation. He had a NSTEMI in November 2018 after inguinal hernia repair surgery.  He is still following up with Dr. Shirlee Latch and Dr. Elberta Fortis.  His endocrinologist is Dr.  Fransico Him.    Past Medical History:  Diagnosis Date   AICD (automatic cardioverter/defibrillator) present    medtronic-   DR. Drusilla Wampole , DR. Shirlee Latch    Anginal pain (HCC)    cp sat 08/11/17   Anxiety    CAD (coronary artery disease)    CHF (congestive heart failure) (HCC)    Complication of anesthesia    took awhile to wake up    Coronary artery disease involving coronary bypass graft    Cyst of neck    right side   DM2 (diabetes mellitus, type 2) (HCC) 08/26/2013   Dyspnea    Heart attack (HCC)    "not sure when" (08/20/2017)   HTN (hypertension) 08/26/2013   Hyperlipidemia 08/26/2013   Hypothyroidism    Left main coronary artery disease    Left renal artery stenosis (HCC)    Genesis 6x12 stent 2007   Obesity (BMI 30.0-34.9) 08/26/2013   Postoperative atrial fibrillation (HCC) 10/15/2005   Presence of permanent cardiac pacemaker    S/P CABG x 4 10/13/2005   LIMA to LAD, SVG to intermediate branch, sequential SVG to PDA and RPL branch, EVH via right thigh   S/P mitral valve replacement with bioprosthetic valve 07/11/2016   31 mm [facility] Mitral bovine bioprosthetic tissue valve   S/P redo CABG x 2 07/11/2016   SVG to PDA and SVG to Intermediate Branch, EVH via left thigh    Past Surgical History:  Procedure Laterality Date   A-FLUTTER ABLATION N/A 05/11/2017   Procedure: A-Flutter Ablation;  Surgeon: Regan Lemming, MD;  Location: [facility] INVASIVE CV LAB;  Service: Cardiovascular;  Laterality: N/A;   CARDIAC CATHETERIZATION N/A 06/21/2016   Procedure: Right/Left Heart Cath and Coronary/Graft Angiography;  Surgeon: Tonny Bollman, MD;  Location: [facility] INVASIVE CV LAB;  Service: Cardiovascular;  Laterality: N/A;   CARDIAC VALVE REPLACEMENT     CARDIOVERSION N/A 07/19/2016   Procedure: CARDIOVERSION;  Surgeon: Lewayne Bunting, MD;  Location: [facility] ENDOSCOPY;  Service: Cardiovascular;  Laterality: N/A;   CARDIOVERSION N/A 09/08/2016   Procedure: CARDIOVERSION;  Surgeon: Pricilla Riffle, MD;  Location: [facility] ENDOSCOPY;  Service: Cardiovascular;  Laterality: N/A;   CORONARY ANGIOPLASTY     STENT 2016  [facility]   CORONARY ANGIOPLASTY WITH STENT PLACEMENT     DES in SVG to right coronary artery system   CORONARY ARTERY BYPASS GRAFT  10/13/2005   LIMA to LAD, SVG to intermediate branch, sequential SVG to PDA and RPL   CORONARY ARTERY BYPASS GRAFT N/A 07/11/2016   Procedure: REDO CORONARY ARTERY BYPASS GRAFTING (CABG) x two using left leg greater saphenous vein harvested endoscopically-SVG to PDA -SVG to RAMUS INTERMEDIATE;  Surgeon: Purcell Nails, MD;  Location: [facility] OR;  Service: Open Heart Surgery;  Laterality: N/A;   CORONARY ARTERY BYPASS GRAFT N/A 07/11/2016   Procedure: Re-exploration (CABG) for post op bleeding,;  Surgeon: Purcell Nails, MD;  Location: [facility] OR;  Service: Open Heart Surgery;  Laterality: N/A;   EP IMPLANTABLE DEVICE N/A 07/25/2016   Procedure: BiV ICD Insertion CRT-D;  Surgeon: Will Jorja Loa, MD;  Location: [facility] INVASIVE CV LAB;  Service: Cardiovascular;  Laterality: N/A;   INGUINAL HERNIA REPAIR Left 08/20/2017   INGUINAL HERNIA REPAIR Left 08/20/2017   Procedure: OPEN LEFT INGUINAL HERNIA REPAIR;  Surgeon: Ovidio Kin, MD;  Location: [facility] OR;  Service: General;  Laterality: Left;   LEFT HEART CATH AND CORS/GRAFTS ANGIOGRAPHY N/A 12/18/2016   Procedure: Left Heart Cath and Cors/Grafts Angiography;  Surgeon: Tonny Bollman, MD;  Location: [facility] INVASIVE CV LAB;  Service: Cardiovascular;  Laterality: N/A;   LEFT HEART CATH AND CORS/GRAFTS ANGIOGRAPHY N/A 08/22/2017   Procedure: LEFT HEART CATH AND CORS/GRAFTS ANGIOGRAPHY;  Surgeon: Laurey Morale, MD;  Location: [facility] INVASIVE CV LAB;  Service: Cardiovascular;  Laterality: N/A;   MITRAL VALVE REPLACEMENT N/A 07/11/2016   Procedure: MITRAL VALVE (MV) REPLACEMENT;  Surgeon: Purcell Nails, MD;  Location: [facility] OR;  Service: Open Heart Surgery;  Laterality: N/A;   MYOCARDICAL PERFUSION  10/09/2007    NORMAL PERFUSION IN ALL REGIONS;NO EVIDENCE OF INDUCIBLE ISCHEMIA;POST STRESS EF% 66   RENAL ARTERY STENT Right 2007   RENAL DOPPLER  03/28/2010   RIGHT RA-NORMAL;LEFT PROXIMAL RA AT STENT-PATENT WITH NO EVIDENCE OF SIGN DIAMETER REDUCTION. R & L KIDNEYS: EQUAL IN SIZE,SYMMETRICAL IN SHAPE.   RIGHT HEART CATH N/A 01/08/2020   Procedure: RIGHT HEART CATH;  Surgeon: Laurey Morale, MD;  Location: [facility] INVASIVE CV LAB;  Service: Cardiovascular;  Laterality: N/A;   TEE WITHOUT CARDIOVERSION N/A 06/15/2016   Procedure: TRANSESOPHAGEAL ECHOCARDIOGRAM (TEE);  Surgeon: Thurmon Fair, MD;  Location: [facility] ENDOSCOPY;  Service: Cardiovascular;  Laterality: N/A;   TEE WITHOUT CARDIOVERSION N/A 07/11/2016   Procedure: TRANSESOPHAGEAL ECHOCARDIOGRAM (TEE);  Surgeon: Purcell Nails, MD;  Location[facility] OR;  Service: Open Heart Surgery;  Laterality: N/A;   TEE WITHOUT CARDIOVERSION N/A 07/19/2016   Procedure: TRANSESOPHAGEAL ECHOCARDIOGRAM (TEE);  Surgeon: Lewayne Bunting, MD;  Location: [facility] ENDOSCOPY;  Service: Cardiovascular;  Laterality: N/A;   TRANSESOPHAGEAL ECHOCARDIOGRAM  10/19/2005   NORMAL LV; MILD TO MODERATE AMOUNT OF SOFT ATHEROMATOUS PLAQUE OF THE THORACIC AORTA; THE LEFT ATRIUM IS MILDLY DILATED;LEFT ATRIAL APPENDAGE FUNCTION IS NORMAL;NO THROMBUS IDENTIFIED. SMALL PFO WITH RIGHT TO LEFT SHUNT    Current Medications: Outpatient Medications Prior to Visit  Medication Sig Dispense Refill   aspirin EC 81 MG tablet Take 81 mg by mouth daily.      Blood Glucose Monitoring Suppl (ACCU-CHEK GUIDE ME) w/Device KIT 1 Piece by Does not apply route as directed. 1 kit 0   carvedilol (COREG) 12.5 MG tablet TAKE 1 & 1/2 (ONE & ONE-HALF) TABLETS BY MOUTH TWICE DAILY WITH A MEAL 180 tablet 3   cholecalciferol (VITAMIN D3) 25 MCG (1000 UNIT) tablet Take 2,000 Units by mouth daily with breakfast.     clopidogrel (PLAVIX) 75 MG tablet Take 1 tablet (75 mg total) by mouth daily. 90 tablet 3   dapagliflozin propanediol  (FARXIGA) 10 MG TABS tablet TAKE 1 TABLET BY MOUTH ONCE DAILY BEFORE BREAKFAST **DICOUNTINE TRADJENTA** 90 tablet 3   digoxin (LANOXIN) 0.125 MG tablet Take 1/2 (one-half) tablet by mouth once daily 45 tablet 4   eplerenone (INSPRA) 50 MG tablet Take 1 tablet (50 mg total) by mouth daily. 90 tablet 3   glipiZIDE (GLUCOTROL XL) 2.5 MG 24 hr tablet Take 1 tablet (2.5 mg total) by mouth daily with breakfast. 90 tablet 1   glucose blood (ACCU-CHEK GUIDE) test strip Use  to monitor glucose once a day at fasting. 100  each 2   levothyroxine (SYNTHROID) 75 MCG tablet Take 0.5 tablets (37.5 mcg total) by mouth daily before breakfast. 45 tablet 1   neomycin-bacitracin-polymyxin (NEOSPORIN) ointment Apply 1 application  topically daily as needed for wound care.     nitroGLYCERIN (NITROSTAT) 0.4 MG SL tablet DISSOLVE ONE TABLET UNDER THE TONGUE EVERY 5 MINUTES AS NEEDED FOR CHEST PAIN.  DO NOT EXCEED A TOTAL OF 3 DOSES IN 15 MINUTES 25 tablet 2   rosuvastatin (CRESTOR) 10 MG tablet Take 1 tablet by mouth once daily 90 tablet 0   sacubitril-valsartan (ENTRESTO) 97-103 MG Take 1 tablet by mouth twice daily 180 tablet 3   sodium zirconium cyclosilicate (LOKELMA) 10 g PACK packet DISSOLVE 1 PACKET IN WATER & DRINK ONCE DAILY 90 each 3   Tetrahydrozoline HCl (VISINE OP) Place 1 drop into both eyes daily as needed (irritation).     amoxicillin (AMOXIL) 500 MG capsule Take 500 mg by mouth. (Patient not taking: Reported on 03/22/2023)     amoxicillin (AMOXIL) 500 MG tablet Take 4 tablets (2,000 mg total) by mouth See admin instructions. Take 2000 mg 1 hour prior to dental work 4 tablet 0   furosemide (LASIX) 20 MG tablet Take 1 tablet (20 mg total) by mouth daily as needed (for a weight over 190 lbs). (Patient not taking: Reported on 03/22/2023) 30 tablet 3   polyethylene glycol (MIRALAX) 17 g packet Take 17 g by mouth as needed. (Patient not taking: Reported on 03/22/2023)     No facility-administered medications prior  to visit.     Allergies:   Xanax [alprazolam]   Social History   Socioeconomic History   Marital status: Married    Spouse name: Not on file   Number of children: 0   Years of education: 18   Highest education level: Not on file  Occupational History   Occupation: retired  Tobacco Use   Smoking status: Never   Smokeless tobacco: Never  Vaping Use   Vaping Use: Never used  Substance and Sexual Activity   Alcohol use: No   Drug use: No   Sexual activity: Never  Other Topics Concern   Not on file  Social History Narrative   Right handed   Two story home   Drinks half and half coffee   Social Determinants of Health   Financial Resource Strain: Not on file  Food Insecurity: Not on file  Transportation Needs: Not on file  Physical Activity: Not on file  Stress: Not on file  Social Connections: Not on file     Family History:  The patient's family history includes Heart attack in his father and mother; Heart disease in his father, maternal grandmother, and mother; Hypertension in his father and sister.   ROS:   Please see the history of present illness.    ROS All other systems are reviewed and are negative.   PHYSICAL EXAM:   VS:  BP 122/72 (BP Location: Left Arm, Patient Position: Sitting, Cuff Size: Normal)   Pulse 81   Ht 6' (1.829 m)   Wt 193 lb 6.4 oz (87.7 kg)   SpO2 96%   BMI 26.23 kg/m      General: Alert, oriented x3, no distress, healthy PM site Head: no evidence of trauma, PERRL, EOMI, no exophtalmos or lid lag, no myxedema, no xanthelasma; normal ears, nose and oropharynx. Cystic R submandibular mass Neck: normal jugular venous pulsations and no hepatojugular reflux; brisk carotid pulses without delay and no carotid bruits  Chest: clear to auscultation, no signs of consolidation by percussion or palpation, normal fremitus, symmetrical and full respiratory excursions Cardiovascular: normal position and quality of the apical impulse, regular rhythm,  normal first and second heart sounds, no murmurs, rubs or gallops Abdomen: no tenderness or distention, no masses by palpation, no abnormal pulsatility or arterial bruits, normal bowel sounds, no hepatosplenomegaly Extremities: no clubbing, cyanosis or edema; 2+ radial, ulnar and brachial pulses bilaterally; 2+ right femoral, posterior tibial and dorsalis pedis pulses; 2+ left femoral, posterior tibial and dorsalis pedis pulses; no subclavian or femoral bruits Neurological: grossly nonfocal Psych: Normal mood and affect     Wt Readings from Last 3 Encounters:  03/22/23 193 lb 6.4 oz (87.7 kg)  02/07/23 189 lb 9.6 oz (86 kg)  12/06/22 191 lb 6.4 oz (86.8 kg)      Studies/Labs Reviewed:   ECHO 05/31/2022   1. Left ventricular ejection fraction, by estimation, is <20%. The left  ventricle has severely decreased function. The left ventricle demonstrates  global hypokinesis. The left ventricular internal cavity size was mildly  dilated. There is mild left  ventricular hypertrophy. Left ventricular diastolic parameters are  indeterminate.   2. Right ventricular systolic function is mildly reduced. The right  ventricular size is normal. There is mildly elevated pulmonary artery  systolic pressure. The estimated right ventricular systolic pressure is  36.6 mmHg.   3. Left atrial size was mild to moderately dilated.   4. Bioprosthetic mitral valve. Mean gradient 5 mmHg, no more than mild  mitral stenosis. No significant mitral regurgitation.   5. The aortic valve is tricuspid. There is mild calcification of the  aortic valve. Aortic valve regurgitation is not visualized. No aortic  stenosis is present.   6. The inferior vena cava is normal in size with greater than 50%  respiratory variability, suggesting right atrial pressure of 3 mmHg.     EKG: 12/06/2022 shows AV pacing with atrial bigeminy and consistent BiV pacing. Recent Labs: 12/06/2022: B Natriuretic Peptide 2,485.3 01/31/2023:  ALT 24; BUN 39; Creatinine, Ser 1.70; Potassium 4.8; Sodium 139; TSH 3.360   Lipid Panel    Component Value Date/Time   CHOL 129 05/31/2022 1142   CHOL 120 10/01/2018 0925   TRIG 81 05/31/2022 1142   HDL 37 (L) 05/31/2022 1142   HDL 31 (L) 10/01/2018 0925   CHOLHDL 3.5 05/31/2022 1142   VLDL 16 05/31/2022 1142   LDLCALC 76 05/31/2022 1142   LDLCALC 60 10/01/2018 0925    ASSESSMENT:    1. Chronic combined systolic and diastolic heart failure (HCC)   2. Coronary artery disease involving native coronary artery of native heart without angina pectoris   3. History of atrial flutter   4. Status post mitral valve replacement with bioprosthetic valve   5. Presence of biventricular implantable cardioverter-defibrillator (ICD)   6. PVCs (premature ventricular contractions)   7. Essential hypertension   8. Dyslipidemia (high LDL; low HDL)   9. Type 2 diabetes mellitus with stage 3b chronic kidney disease, without long-term current use of insulin (HCC)       PLAN:  In order of problems listed above:  CHF:  NYHA functional class I-II.  Clinically euvolemic.  OptiVol normal.  At his "dry weight" of 190 pounds on his home scale, roughly 5 pounds more in office scale.   He is on maximum tolerated doses of carvedilol, Entresto and eplerenone and dapagliflozin.  Also on digoxin.  Requires Lokelma to keep his potassium in normal  range.  On most recent echo LVEF is less than 20%. CAD: No angina. AFlutter: No recurrence since his ablation, based on pacemaker monitoring.  He is not taking anticoagulants or antiarrhythmics S/P MVR: No more bioprosthetic valve function on echo performed 05/31/2022 with a mean mitral gradient 5 mmHg and no regurgitation. CRT-D: Normal device function.  Efficient BiV pacing is fair at 91%.  Previously it was as low as 86%, due to frequent PVCs.  Expect ERI in 5 months - will schedule f/u w Dr. Elberta Fortis. PVCs: Diminished in frequency.  So far we have decided not to  provide specific treatment for the PVCs, other than the beta-blocker.  He did not tolerate higher doses of carvedilol due to hypotension.  Consider mexiletine in the future. Hx HTN: Well-controlled, even lower when he checks it at home. HLP: LDL close to target range, HDL chronically low.  On rosuvastatin. DM: Fair control, A1c recently 7.3%.  In the past he had as good as 6.0% but has become more lax with his diet. Sees Dr. Fransico Him CKD 3b: Most recent creatinine 1.7. Baseline creatinine around 1.5, GFR roughly 45.  Requires Lokelma to prevent hyperkalemia while taking Entresto and eplerenone. History of left renal artery stenosis status post stent 2007.  Stable renal function and well-controlled hypertension. Submandibular mass: Reluctant to undergo general anesthesia which would be necessary for resection of this mass.  Also aware of the risk for cranial nerve palsy.  Precluded implantation of a Barostim device. Left upper lobe paramediastinal mass: probably dystrophic calcification/suture material from previous surgery.    Medication Adjustments/Labs and Tests Ordered: Current medicines are reviewed at length with the patient today.  Concerns regarding medicines are outlined above.  Medication changes, Labs and Tests ordered today are listed in the Patient Instructions below. Patient Instructions  Medication Instructions:  No changes *If you need a refill on your cardiac medications before your next appointment, please call your pharmacy*  Follow-Up: At [facility], you and your health needs are our priority.  As part of our continuing mission to provide you with exceptional heart care, we have created designated Provider Care Teams.  These Care Teams include your primary Cardiologist (physician) and Advanced Practice Providers (APPs -  Physician Assistants and Nurse Practitioners) who all work together to provide you with the care you need, when you need it.  We recommend signing up  for the patient portal called "MyChart".  Sign up information is provided on this After Visit Summary.  MyChart is used to connect with patients for Virtual Visits (Telemedicine).  Patients are able to view lab/test results, encounter notes, upcoming appointments, etc.  Non-urgent messages can be sent to your provider as well.   To learn more about what you can do with MyChart, go to [email or web address].    Your next appointment:    5 month f/u with Dr Elberta Fortis- battery close to ERI  8 month f/u with Dr Royann Shivers    Signed, Thurmon Fair, MD  03/22/2023 10:04 PM    [facility] [address] [place], [place], [place]  [address] Phone: [phone]; Fax: [phone]

## 2023-04-16 ENCOUNTER — Ambulatory Visit (INDEPENDENT_AMBULATORY_CARE_PROVIDER_SITE_OTHER): Payer: Medicare Other

## 2023-04-16 DIAGNOSIS — Z95810 Presence of automatic (implantable) cardiac defibrillator: Secondary | ICD-10-CM | POA: Diagnosis not present

## 2023-04-16 DIAGNOSIS — I5042 Chronic combined systolic (congestive) and diastolic (congestive) heart failure: Secondary | ICD-10-CM

## 2023-04-22 NOTE — Progress Notes (Signed)
EPIC Encounter for ICM Monitoring  Patient Name: Christian Bowman is a 77 y.o. male Date: 04/22/2023 Primary Care Physican: Assunta Found, MD Primary Cardiologist: Cro[place] Electrophysiologist: Elberta Fortis Bi-V Pacing: 96%            05/28/2022 Weight: 188.6 lbs 05/29/2022 Weight: 186.1 lbs  07/04/2022 Weight: 183-185 lbs 08/16/2022 Weight: 185 lbs    09/21/2022 Weight: 183.3 lbs   12/06/2022 Office Weight: 191 lbs    Time in AT/AF 0.0 hr/day (0.0%)   Battery Longevity: 4 months                                                 Transmission reviewed.    Diet: Wife records sodium intake by reviewing food labels.     Optivol thoracic impedance suggesting normal fluid levels within the last month.    Prescribed:  Furosemide 20 mg Take 1 tablet (20 mg total) by mouth daily as needed (for a weight over 190 lbs).   Labs: 01/31/2023 Creatinine 1.70, BUN 39, Potassium 4.8, Sodium 139, GFR 42 12/06/2022 Creatinine 1.52, BUN 27, Potassium 4.3, Sodium 139, GFR 47  A complete set of results can be found in Results Review.   Recommendations:  No changes.    Follow-up plan: ICM clinic phone appointment on 05/22/2023.   91 day device clinic remote transmission 07/24/2023.     EP/Cardiology Office Visits: 06/07/2023 with Dr. Shirlee Latch.   Recall 12/17/2023 with Dr Royann Shivers.   Recall 10/12/2023 with Dr Elberta Fortis.   Copy of ICM check sent to Dr. Elberta Fortis.  3 month ICM trend: 04/17/2023.    12-14 Month ICM trend:     [name], RN 04/22/2023 10:50 AM

## 2023-04-24 ENCOUNTER — Ambulatory Visit (INDEPENDENT_AMBULATORY_CARE_PROVIDER_SITE_OTHER): Payer: Medicare Other

## 2023-04-24 DIAGNOSIS — I5022 Chronic systolic (congestive) heart failure: Secondary | ICD-10-CM

## 2023-04-26 LAB — CUP PACEART REMOTE DEVICE CHECK
Battery Remaining Longevity: 4 mo
Battery Voltage: 2.79 V
Brady Statistic AP VS Percent: 0.14 %
Brady Statistic AS VP Percent: 24.79 %
Brady Statistic RA Percent Paced: 73.41 %
Brady Statistic RV Percent Paced: 96.84 %
Date Time Interrogation Session: 20240723161358
HighPow Impedance: 66 Ohm
Implantable Lead Connection Status: 753985
Implantable Lead Connection Status: 753985
Implantable Lead Implant Date: 20171024
Implantable Lead Implant Date: 20171024
Implantable Lead Location: 753858
Implantable Lead Location: 753859
Implantable Lead Location: 753860
Implantable Lead Model: 5076
Implantable Pulse Generator Implant Date: 20171024
Lead Channel Impedance Value: 1026 Ohm
Lead Channel Impedance Value: 1064 Ohm
Lead Channel Impedance Value: 1064 Ohm
Lead Channel Impedance Value: 1064 Ohm
Lead Channel Impedance Value: 285.934
Lead Channel Impedance Value: 297.365
Lead Channel Impedance Value: 301.328
Lead Channel Impedance Value: 361 Ohm
Lead Channel Impedance Value: 418 Ohm
Lead Channel Impedance Value: 475 Ohm
Lead Channel Impedance Value: 513 Ohm
Lead Channel Impedance Value: 551 Ohm
Lead Channel Impedance Value: 646 Ohm
Lead Channel Impedance Value: 665 Ohm
Lead Channel Impedance Value: 817 Ohm
Lead Channel Impedance Value: 950 Ohm
Lead Channel Pacing Threshold Amplitude: 0.5 V
Lead Channel Pacing Threshold Amplitude: 1 V
Lead Channel Pacing Threshold Amplitude: 1.125 V
Lead Channel Pacing Threshold Pulse Width: 0.4 ms
Lead Channel Pacing Threshold Pulse Width: 0.4 ms
Lead Channel Pacing Threshold Pulse Width: 1 ms
Lead Channel Sensing Intrinsic Amplitude: 0.75 mV
Lead Channel Sensing Intrinsic Amplitude: 0.75 mV
Lead Channel Sensing Intrinsic Amplitude: 24.75 mV
Lead Channel Sensing Intrinsic Amplitude: 24.75 mV
Lead Channel Setting Pacing Amplitude: 1.75 V
Lead Channel Setting Pacing Amplitude: 2 V
Lead Channel Setting Pacing Pulse Width: 0.4 ms
Lead Channel Setting Pacing Pulse Width: 1 ms
Lead Channel Setting Sensing Sensitivity: 0.3 mV

## 2023-05-10 NOTE — Progress Notes (Signed)
Remote ICD transmission.   

## 2023-05-16 ENCOUNTER — Other Ambulatory Visit: Payer: Self-pay

## 2023-05-16 DIAGNOSIS — N1831 Chronic kidney disease, stage 3a: Secondary | ICD-10-CM

## 2023-05-16 MED ORDER — ACCU-CHEK GUIDE VI STRP
ORAL_STRIP | 2 refills | Status: DC
Start: 2023-05-16 — End: 2024-03-13

## 2023-05-19 ENCOUNTER — Other Ambulatory Visit (HOSPITAL_COMMUNITY): Payer: Self-pay | Admitting: Cardiology

## 2023-05-22 ENCOUNTER — Ambulatory Visit (INDEPENDENT_AMBULATORY_CARE_PROVIDER_SITE_OTHER): Payer: Medicare Other

## 2023-05-22 ENCOUNTER — Ambulatory Visit: Payer: Medicare Other | Attending: Cardiology

## 2023-05-22 DIAGNOSIS — I5022 Chronic systolic (congestive) heart failure: Secondary | ICD-10-CM | POA: Diagnosis not present

## 2023-05-22 DIAGNOSIS — Z95810 Presence of automatic (implantable) cardiac defibrillator: Secondary | ICD-10-CM

## 2023-05-23 LAB — CUP PACEART REMOTE DEVICE CHECK
Battery Remaining Longevity: 4 mo
Battery Voltage: 2.77 V
Brady Statistic AP VP Percent: 76.64 %
Brady Statistic AP VS Percent: 0.09 %
Brady Statistic AS VP Percent: 22.98 %
Brady Statistic AS VS Percent: 0.3 %
Brady Statistic RA Percent Paced: 74.62 %
Brady Statistic RV Percent Paced: 95.84 %
Date Time Interrogation Session: 20240820160505
HighPow Impedance: 64 Ohm
Implantable Lead Connection Status: 753985
Implantable Lead Connection Status: 753985
Implantable Lead Connection Status: 753985
Implantable Lead Implant Date: 20171024
Implantable Lead Implant Date: 20171024
Implantable Lead Implant Date: 20171024
Implantable Lead Location: 753858
Implantable Lead Location: 753859
Implantable Lead Location: 753860
Implantable Lead Model: 4598
Implantable Lead Model: 5076
Implantable Pulse Generator Implant Date: 20171024
Lead Channel Impedance Value: 1007 Ohm
Lead Channel Impedance Value: 1026 Ohm
Lead Channel Impedance Value: 257.018
Lead Channel Impedance Value: 257.018
Lead Channel Impedance Value: 257.018
Lead Channel Impedance Value: 294.5 Ohm
Lead Channel Impedance Value: 294.5 Ohm
Lead Channel Impedance Value: 342 Ohm
Lead Channel Impedance Value: 399 Ohm
Lead Channel Impedance Value: 418 Ohm
Lead Channel Impedance Value: 456 Ohm
Lead Channel Impedance Value: 589 Ohm
Lead Channel Impedance Value: 589 Ohm
Lead Channel Impedance Value: 589 Ohm
Lead Channel Impedance Value: 722 Ohm
Lead Channel Impedance Value: 893 Ohm
Lead Channel Impedance Value: 931 Ohm
Lead Channel Impedance Value: 950 Ohm
Lead Channel Pacing Threshold Amplitude: 0.5 V
Lead Channel Pacing Threshold Amplitude: 1 V
Lead Channel Pacing Threshold Amplitude: 1 V
Lead Channel Pacing Threshold Pulse Width: 0.4 ms
Lead Channel Pacing Threshold Pulse Width: 0.4 ms
Lead Channel Pacing Threshold Pulse Width: 1 ms
Lead Channel Sensing Intrinsic Amplitude: 0.75 mV
Lead Channel Sensing Intrinsic Amplitude: 0.75 mV
Lead Channel Sensing Intrinsic Amplitude: 13.625 mV
Lead Channel Sensing Intrinsic Amplitude: 13.625 mV
Lead Channel Setting Pacing Amplitude: 1.5 V
Lead Channel Setting Pacing Amplitude: 2 V
Lead Channel Setting Pacing Amplitude: 2 V
Lead Channel Setting Pacing Pulse Width: 0.4 ms
Lead Channel Setting Pacing Pulse Width: 1 ms
Lead Channel Setting Sensing Sensitivity: 0.3 mV
Zone Setting Status: 755011
Zone Setting Status: 755011

## 2023-05-23 NOTE — Progress Notes (Signed)
EPIC Encounter for ICM Monitoring  Patient Name: Christian Bowman is a 77 y.o. male Date: 05/23/2023 Primary Care Physican: Assunta Found, MD Primary Cardiologist: Croitoru/McLean Electrophysiologist: Elberta Fortis Bi-V Pacing: 95.8%            09/21/2022 Weight: 183.3 lbs   03/22/2023 Office Weight: 193 lbs    Time in AT/AF <0.1 hr/day (<0.1%)   Battery Longevity: 4 months                                                 Spoke with wife and heart failure questions reviewed.  Transmission results reviewed.  Pt reported SOB during 8/12 decreased impedance for a couple of days and took PRN Furosemide.  He had another episode of SOB this week but did not take a Lasix.  BP has been running low.  Advised to record BP daily and take readings to 9/5 OV with Dr Shirlee Latch.   Diet: Wife records sodium intake by reviewing food labels.     Optivol thoracic impedance suggesting normal fluid levels within the last month.    Prescribed:  Furosemide 20 mg Take 1 tablet (20 mg total) by mouth daily as needed (for a weight over 190 lbs).   Labs: 01/31/2023 Creatinine 1.70, BUN 39, Potassium 4.8, Sodium 139, GFR 42 12/06/2022 Creatinine 1.52, BUN 27, Potassium 4.3, Sodium 139, GFR 47  A complete set of results can be found in Results Review.   Recommendations:  Encouraged to have patient take Lasix when SOB.  No changes and encouraged to call if condition changes.   Follow-up plan: ICM clinic phone appointment on 06/25/2023.   91 day device clinic remote transmission 07/24/2023.     EP/Cardiology Office Visits: 06/07/2023 with Dr. Shirlee Latch.   Recall 12/17/2023 with Dr Royann Shivers.   08/20/2024 with Dr Elberta Fortis.   Copy of ICM check sent to Dr. Elberta Fortis.  3 month ICM trend: 05/21/2023.    12-14 Month ICM trend:     [name], RN 05/23/2023 4:26 PM

## 2023-06-01 NOTE — Progress Notes (Signed)
Remote ICD transmission.   

## 2023-06-07 ENCOUNTER — Ambulatory Visit (HOSPITAL_COMMUNITY)
Admission: RE | Admit: 2023-06-07 | Discharge: 2023-06-07 | Disposition: A | Discharge status: Home / Self Care | Payer: Medicare Other | Source: Ambulatory Visit | Attending: *Deleted | Admitting: *Deleted

## 2023-06-07 ENCOUNTER — Encounter (HOSPITAL_COMMUNITY): Payer: Self-pay | Admitting: Cardiology

## 2023-06-07 ENCOUNTER — Other Ambulatory Visit: Payer: Self-pay

## 2023-06-07 ENCOUNTER — Ambulatory Visit (HOSPITAL_BASED_OUTPATIENT_CLINIC_OR_DEPARTMENT_OTHER)
Admission: RE | Admit: 2023-06-07 | Discharge: 2023-06-07 | Disposition: A | Discharge status: Home / Self Care | Payer: Medicare Other | Source: Ambulatory Visit | Attending: Cardiology | Admitting: Cardiology

## 2023-06-07 VITALS — BP 130/80 | HR 72 | Wt 191.6 lb

## 2023-06-07 DIAGNOSIS — I255 Ischemic cardiomyopathy: Secondary | ICD-10-CM | POA: Diagnosis not present

## 2023-06-07 DIAGNOSIS — Z7902 Long term (current) use of antithrombotics/antiplatelets: Secondary | ICD-10-CM | POA: Insufficient documentation

## 2023-06-07 DIAGNOSIS — I5022 Chronic systolic (congestive) heart failure: Secondary | ICD-10-CM

## 2023-06-07 DIAGNOSIS — E875 Hyperkalemia: Secondary | ICD-10-CM | POA: Insufficient documentation

## 2023-06-07 DIAGNOSIS — I2510 Atherosclerotic heart disease of native coronary artery without angina pectoris: Secondary | ICD-10-CM | POA: Insufficient documentation

## 2023-06-07 DIAGNOSIS — E785 Hyperlipidemia, unspecified: Secondary | ICD-10-CM | POA: Diagnosis not present

## 2023-06-07 DIAGNOSIS — D170 Benign lipomatous neoplasm of skin and subcutaneous tissue of head, face and neck: Secondary | ICD-10-CM | POA: Insufficient documentation

## 2023-06-07 DIAGNOSIS — Z8673 Personal history of transient ischemic attack (TIA), and cerebral infarction without residual deficits: Secondary | ICD-10-CM | POA: Diagnosis not present

## 2023-06-07 DIAGNOSIS — I340 Nonrheumatic mitral (valve) insufficiency: Secondary | ICD-10-CM | POA: Insufficient documentation

## 2023-06-07 DIAGNOSIS — I130 Hypertensive heart and chronic kidney disease with heart failure and stage 1 through stage 4 chronic kidney disease, or unspecified chronic kidney disease: Secondary | ICD-10-CM | POA: Insufficient documentation

## 2023-06-07 DIAGNOSIS — I4892 Unspecified atrial flutter: Secondary | ICD-10-CM | POA: Insufficient documentation

## 2023-06-07 DIAGNOSIS — I442 Atrioventricular block, complete: Secondary | ICD-10-CM | POA: Insufficient documentation

## 2023-06-07 DIAGNOSIS — Z79899 Other long term (current) drug therapy: Secondary | ICD-10-CM | POA: Insufficient documentation

## 2023-06-07 DIAGNOSIS — I252 Old myocardial infarction: Secondary | ICD-10-CM | POA: Insufficient documentation

## 2023-06-07 DIAGNOSIS — N183 Chronic kidney disease, stage 3 unspecified: Secondary | ICD-10-CM | POA: Diagnosis not present

## 2023-06-07 DIAGNOSIS — Z950 Presence of cardiac pacemaker: Secondary | ICD-10-CM | POA: Diagnosis not present

## 2023-06-07 DIAGNOSIS — N62 Hypertrophy of breast: Secondary | ICD-10-CM | POA: Insufficient documentation

## 2023-06-07 DIAGNOSIS — Z8774 Personal history of (corrected) congenital malformations of heart and circulatory system: Secondary | ICD-10-CM | POA: Insufficient documentation

## 2023-06-07 DIAGNOSIS — I25810 Atherosclerosis of coronary artery bypass graft(s) without angina pectoris: Secondary | ICD-10-CM | POA: Insufficient documentation

## 2023-06-07 DIAGNOSIS — E1122 Type 2 diabetes mellitus with diabetic chronic kidney disease: Secondary | ICD-10-CM | POA: Insufficient documentation

## 2023-06-07 DIAGNOSIS — Z953 Presence of xenogenic heart valve: Secondary | ICD-10-CM | POA: Diagnosis not present

## 2023-06-07 DIAGNOSIS — Z7982 Long term (current) use of aspirin: Secondary | ICD-10-CM | POA: Diagnosis not present

## 2023-06-07 LAB — ECHOCARDIOGRAM COMPLETE
Area-P 1/2: 1 cm2
Calc EF: 19.4 %
Est EF: <20
MV VTI: 0.75 cm2
S' Lateral: 7.3 cm
Single Plane A2C EF: 17.4 %
Single Plane A4C EF: 18.6 %

## 2023-06-07 LAB — BASIC METABOLIC PANEL
Anion gap: 8 (ref 5–15)
BUN: 30 mg/dL — ABNORMAL HIGH (ref 8–23)
CO2: 26 mmol/L (ref 22–32)
Calcium: 9.6 mg/dL (ref 8.9–10.3)
Chloride: 103 mmol/L (ref 98–111)
Creatinine, Ser: 1.56 mg/dL — ABNORMAL HIGH (ref 0.61–1.24)
GFR, Estimated: 46 mL/min — ABNORMAL LOW (ref >60)
Glucose, Bld: 117 mg/dL — ABNORMAL HIGH (ref 70–99)
Potassium: 4.6 mmol/L (ref 3.5–5.1)
Sodium: 137 mmol/L (ref 135–145)

## 2023-06-07 LAB — DIGOXIN LEVEL: Digoxin Level: 0.4 ng/mL — ABNORMAL LOW (ref 0.8–2.0)

## 2023-06-07 NOTE — Patient Instructions (Signed)
Medication Changes:  No Changes In Medications at this time.   Lab Work:  Labs done today, your results will be available in MyChart, we will contact you for abnormal readings.  Follow-Up in: 6 months PLEASE CALL OUR OFFICE AROUND JANUARY 2025 TO GET SCHEDULED FOR YOUR APPOINTMENT. PHONE NUMBER IS [phone] OPTION 2   At the [facility], you and your health needs are our priority. We have a designated team specialized in the treatment of Heart Failure. This Care Team includes your primary Heart Failure Specialized Cardiologist (physician), Advanced Practice Providers (APPs- Physician Assistants and Nurse Practitioners), and Pharmacist who all work together to provide you with the care you need, when you need it.   You may see any of the following providers on your designated Care Team at your next follow up:  Dr. Arvilla Meres Dr. Marca Ancona Dr. Marcos Eke, NP Robbie Lis, [place] [facility] Iii Allen, [place] Brynda Peon, NP Karle Plumber, PharmD   Please be sure to bring in all your medications bottles to every appointment.   Need to Contact us:  If you have any questions or concerns before your next appointment please send [place] a message through [place] or call our office at [phone].    TO LEAVE A MESSAGE FOR THE NURSE SELECT OPTION 2, PLEASE LEAVE A MESSAGE INCLUDING: YOUR NAME DATE OF BIRTH CALL BACK NUMBER REASON FOR CALL**this is important as we prioritize the call backs  YOU WILL RECEIVE A CALL BACK THE SAME DAY AS LONG AS YOU CALL BEFORE 4:00 PM

## 2023-06-10 NOTE — Progress Notes (Signed)
Date:  06/10/2023   ID:  Christian Bowman, DOB 1946-08-23, MRN [id number]   Provider location: [facility] Type of Visit: Established patient   PCP:  Assunta Found, MD  Cardiologist: Dr. Royann Shivers  HF Cardiologist:  Marca Ancona, MD   History of Present Illness: Christian Bowman is a 77 y.o. male with history of CAD s/p CABG in 2007 then redo CABG in 10/17 with mitral valve replacement.  He was admitted in 10/17 for redo CABG with SVG-PDA and SVG-ramus.  He also had mitral valve replacement with a bioprosthetic valve because of infarct-related mitral regurgitation.  Post-operative course was complicated by CHF requiring diuresis.  He also had atrial flutter and required DCCV.  Due to complete heart block, he later got a CRT-D system.     At a prior visit, he was in atrial flutter.  He saw Dr. Elberta Fortis, it was decided to arrange for DCCV with ablation down the road when PPM leads have been in longer.  He had successful DCCV in 12/17 and is in NSR today.    He was admitted in 3/18 with NSTEMI and chest pain. TnI only 0.5.  LHC showed occlusion of SVG-PDA from CABG#1 to be the likely culprit.  However, SVG-PDA from CABG#2 was patent.  No intervention.  Echo in 3/18 from [place] showed EF 30-35%, stable bioprosthetic mitral valve.    He had atrial flutter ablation in 8/18.  He is in NSR today and is off amiodarone.     In 11/18, he had an inguinal hernia repair. Post-op, he had an NSTEMI.  LHC showed occlusion of a PLV branch that had been backfilled by SVG-PDA (prior cath had shown severe diffuse disease in the PLV).  Echo in 11/18 showed EF 20-25% with normal bioprosthetic mitral valve.     With no recurrence of atrial arrhythmias, he is now off anticoagulation.   Echo in 7/20 showed EF 25-30%, bioprosthetic MV with no MR and mean gradient 5 mmHg, normal RV.    In 1/21, he developed right hand weakness and dysarthria concerning for CVA.  He went to the ER in [place] and had  tPA.  Symptoms resolved.  Atrial fibrillation was not noted per his wife's report.  CTA head/neck showed no significant carotid stenosis.  He was started on Plavix 75 mg daily and sent home.    CPX (3/21) showed severe functional impairment due to HF.  Echo showed EF 20-25%, normal RV, normally functioning bioprosthetic mitral valve.  RHC in 4/21 showed low filling pressures and low cardiac index (2.1 Fick, 1.9 thermo).  He saw Dr. Vickey Sages to discuss possible lateral thoracotomy access LVAD. He decided against LVAD placement.   Echo in 8/23 showed EF < 20%, mild LV dilation, mildly decreased RV systolic function, PASP 37 mmHg, bioprosthetic mitral valve with mean gradient 5 mmHg and no MR, IVC normal.   Zio monitor in 9/23 showed 8.5% PVCs.  With CRT percentage < 90%, we discussed mexiletine to suppress PVCs but he decided against this.    Echo was done today and reviewed, showing EF < 20% with global hypokinesis, no LV thrombus, moderate RV dysfunction with normal RV size, normal bioprosthetic mitral valve with mean gradient 5 mmHg.    Patient returns for followup of CHF.  BP is high for him, usually runs SBP 100s-110s.  No lightheadedness. He continues to go to cardiac rehab 2 times/week. Mild dyspnea walking up stairs.  Difficult for him  to walk outside on humid days, does ok in cooler weather.  No chest pain.  No orthopnea/PND. Weight stable.   Medtronic device interrogation: No AF/VT, stable thoracic impedance, >95% BiV pacing  ECG (personally reviewed): A-BiV pacing   Labs (11/17): K 4.8, creatinine 1.89, hgb 12.3, digoxin 0.9, LFTs normal, TSH 8.235 (mild increase), free T3 low, free T4 normal, LFTs normal.  Labs (12/17): K 4.5, creatinine 1.7, BNP 3909 Labs (3/18): K 3.9, creatinine 1.48, hgb 11.3 Labs (4/18): digoxin 0.5, LFTs normal Labs (7/18): K 5.1, creatinine 1.78, LDL 63, HDL 29, TSH elevated Labs (11/18): K 4.5, creatinine 1.86, hgb 13.6 Labs (12/18): K 5, creatinine 1.88 Labs  (1/19): digoxin 0.3, K 4.7, creatinine 1.83 Labs (6/19): LDL 70, HDL 32, K 5.1, creatinine 1.61 Labs (12/19): LDL 60, HDL 31 Labs (1/20): K 5.1, creatinine 1.64 Labs (4/20): K 4.5, creatinine 1.77 Labs (8/20): K 4.6, creatinine 1.5 Labs (1/21): K 4.5, creatinine 1.58, LDL 69 Labs (2/21): K 4.5, creatinine 1.66 Labs (4/21): K 4.1, creatinine 1.63, digoxin 0.4 Labs (5/21): K 4.4, creatinine 1.51 Labs (6/21): K 4.4, creatinine 1.51, digoxin 0.3 Labs (12/21): K 4.9, creatinine 1.69 Labs (5/22): K 4.5, creatinine 1.48 Labs (10/22): LDL 71 Labs (1/23): K 4.5, creatinine 1.49 Labs (7/23): K 4, creatinine 1.59 Labs (10/23): K 4.5, creatinine 1.43, LFTs normal Labs (5/24): K 4.8, creatinine 1.7, TSH normal   PMH: 1. CAD: CABG 2007.  - LHC (9/17) with patent LIMA-LAD, totally occluded SVG-D, severe stenosis in SVG-PDA.  - Redo CABG 10/17 with SVG-PDA, SVG-ramus and mitral valve replacement.  - NSTEMI 3/18.  LHC with culprit lesion likely occlusion of SVG-PDA from CABG#1.  Patent SVG-PDA from CABG#2.  No intervention.  - NSTEMI 11/18 post-op inguinal hernia repair.  LHC with occlusion of a PLV branch that had been backfilled by SVG-PDA (prior cath had shown severe diffuse disease in the PLV).  2. Mitral regurgitation: Ischemic MR, mitral valve replacement was done in 10/17 (bioprosthetic). 3. Complete heart block: Post-op in 10/17.  Medtronic CRT-D placed.  4. Atrial flutter: DCCV 10/17 and again in 12/17.  - Ablation 8/18, now off amiodarone.  5. Type II diabetes 6. Hyperlipidemia 7. Chronic systolic CHF: Ischemic cardiomyopathy.   - TEE (10/17): EF 20-25%, severe LV dilation - Echo (3/18, [place]): EF 30-35%, bioprosthetic mitral valve with mean gradient 5 mmHg.  - Echo (11/18): EF 20-25%, moderate LV dilation, normal bioprosthetic mitral valve.  - Echo (7/20): EF 25-30%, moderate LV dilation, inferior/inferolateral AK, bioprosthetic MV with mean gradient 5 and no MR, normal RV.  -  Painful gynecomastia with spironolactone.  - Echo (1/21): EF < 20%, normal RV size and systolic function, bioprosthetic mitral valve with mild MR, mean gradient 2 mmHg.  - CPX (3/21): peak VO2 11.2, VE/VCO2 slope 56, RER 1.21.  - Echo (3/21): EF 20-25%, no LV thrombus, RV normal, bioprosthetic mitral valve appear normal.  - RHC (4/21): mean RA 3, PA 25/7, mean PCWP 11, CI 2.1 (Fick), 1.9 (thermo).  - Echo (8/23): EF < 20%, mild LV dilation, mildly decreased RV systolic function, PASP 37 mmHg, bioprosthetic mitral valve with mean gradient 5 mmHg and no MR, IVC normal.  - Echo (9/24): EF < 20% with global hypokinesis, no LV thrombus, moderate RV dysfunction with normal RV size, normal bioprosthetic mitral valve with mean gradient 5 mmHg.   8. Hypothyroidism 9. CVA 1/21: CTA head/neck with no carotid stenosis.  10. PVCs: Zio monitor in 9/23 showed 8.5% PVCs  Current Outpatient  Medications  Medication Sig Dispense Refill   amoxicillin (AMOXIL) 500 MG tablet Take 4 tablets (2,000 mg total) by mouth See admin instructions. Take 2000 mg 1 hour prior to dental work 4 tablet 0   aspirin EC 81 MG tablet Take 81 mg by mouth daily.      Blood Glucose Monitoring Suppl (ACCU-CHEK GUIDE ME) w/Device KIT 1 Piece by Does not apply route as directed. 1 kit 0   carvedilol (COREG) 12.5 MG tablet TAKE 1 & 1/2 (ONE & ONE-HALF) TABLETS BY MOUTH TWICE DAILY WITH A MEAL 180 tablet 3   cholecalciferol (VITAMIN D3) 25 MCG (1000 UNIT) tablet Take 2,000 Units by mouth daily with breakfast.     clopidogrel (PLAVIX) 75 MG tablet Take 1 tablet (75 mg total) by mouth daily. 90 tablet 3   dapagliflozin propanediol (FARXIGA) 10 MG TABS tablet TAKE 1 TABLET BY MOUTH ONCE DAILY BEFORE BREAKFAST **DICOUNTINE TRADJENTA** 90 tablet 3   digoxin (LANOXIN) 0.125 MG tablet Take 1/2 (one-half) tablet by mouth once daily 45 tablet 4   eplerenone (INSPRA) 50 MG tablet Take 1 tablet (50 mg total) by mouth daily. 90 tablet 3   furosemide  (LASIX) 20 MG tablet Take 1 tablet (20 mg total) by mouth daily as needed (for a weight over 190 lbs). 30 tablet 3   glipiZIDE (GLUCOTROL XL) 2.5 MG 24 hr tablet Take 1 tablet (2.5 mg total) by mouth daily with breakfast. 90 tablet 1   glucose blood (ACCU-CHEK GUIDE) test strip Use  to monitor glucose once a day at fasting. 100 each 2   levothyroxine (SYNTHROID) 75 MCG tablet Take 0.5 tablets (37.5 mcg total) by mouth daily before breakfast. 45 tablet 1   neomycin-bacitracin-polymyxin (NEOSPORIN) ointment Apply 1 application  topically daily as needed for wound care.     nitroGLYCERIN (NITROSTAT) 0.4 MG SL tablet DISSOLVE ONE TABLET UNDER THE TONGUE EVERY 5 MINUTES AS NEEDED FOR CHEST PAIN.  DO NOT EXCEED A TOTAL OF 3 DOSES IN 15 MINUTES 25 tablet 0   polyethylene glycol (MIRALAX) 17 g packet Take 17 g by mouth as needed.     rosuvastatin (CRESTOR) 10 MG tablet Take 1 tablet by mouth once daily 90 tablet 0   sacubitril-valsartan (ENTRESTO) 97-103 MG Take 1 tablet by mouth twice daily 180 tablet 3   sodium zirconium cyclosilicate (LOKELMA) 10 g PACK packet DISSOLVE 1 PACKET IN WATER & DRINK ONCE DAILY 90 each 3   Tetrahydrozoline HCl (VISINE OP) Place 1 drop into both eyes daily as needed (irritation).     No current facility-administered medications for this encounter.    Allergies:   Xanax [alprazolam]   Social History:  The patient  reports that he has never smoked. He has never used smokeless tobacco. He reports that he does not drink alcohol and does not use drugs.   Family History:  The patient's family history includes Heart attack in his father and mother; Heart disease in his father, maternal grandmother, and mother; Hypertension in his father and sister.   ROS:  Please see the history of present illness.   All other systems are personally reviewed and negative.   Exam:   BP 130/80   Pulse 72   Wt 86.9 kg (191 lb 9.6 oz)   SpO2 96%   BMI 25.99 kg/m   General: NAD Neck: No JVD,  no thyromegaly or thyroid nodule.  Lungs: Clear to auscultation bilaterally with normal respiratory effort. CV: Nondisplaced PMI.  Heart regular S1/S2,  no S3/S4, no murmur.  No peripheral edema.  No carotid bruit.  Normal pedal pulses.  Abdomen: Soft, nontender, no hepatosplenomegaly, no distention.  Skin: Intact without lesions or rashes.  Neurologic: Alert and oriented x 3.  Psych: Normal affect. Extremities: No clubbing or cyanosis.  HEENT: Normal.   Recent Labs: 12/06/2022: B Natriuretic Peptide 2,485.3 01/31/2023: ALT 24; TSH 3.360 06/07/2023: BUN 30; Creatinine, Ser 1.56; Potassium 4.6; Sodium 137  Personally reviewed   Wt Readings from Last 3 Encounters:  06/07/23 86.9 kg (191 lb 9.6 oz)  03/22/23 87.7 kg (193 lb 6.4 oz)  02/07/23 86 kg (189 lb 9.6 oz)    ASSESSMENT AND PLAN:  1. Chronic systolic CHF: Ischemic cardiomyopathy.  TEE 10/17 with EF 20-25%.  Echo in 3/21 shoed EF 20-25%, normal RV function, normal bioprosthetic mitral valve.  CPX in 3/21 showed severe functional impairment due to HF with concern for poor short-term prognosis.  RHC in 4/21 showed normal filling pressures but low cardiac index. Last echo in 9/24 with EF < 20% with global hypokinesis, no LV thrombus, moderate RV dysfunction with normal RV size, normal bioprosthetic mitral valve with mean gradient 5 mmHg.  I have talked to him about advanced therapies given poor prognosis with low cardiac output. He is not a transplant candidate.  He has had 2 prior sternotomies, which makes LVAD more complicated.  We discussed LVAD extensively.  He is very clear that he does not want another cardiac surgery.  He has been stable recently with NYHA class II-III symptoms.  Not volume overloaded by exam or Optivol.  - Continue to use Lasix prn.    - Continue Entresto 97/103 bid.  - Gynecomastia with spironolactone.  Continue eplerenone 50 mg daily (goal dose).   - Continue Coreg 18.75 mg bid.  He did not tolerate increasing Coreg to  25 mg bid due to dizziness.       - He has been on Lokelma chronically to control hyperkalemia and allow use of eplerenone and Entresto, continue.  BMET/BNP today.  - Continue Farxiga 10 mg daily.    - Continue digoxin 0.0625 daily.  Check digoxin level.  - As he has turned down LVAD, we have discussed baroreceptor activation therapy in the past to help with his symptoms. His right neck lipoma poses a problem for vagal lead placement, however. He would need the lipoma removed.   - BiV pacing percentage is higher today at >95% (PVCs lowered the the percentage of BiV pacing in the past).  He has not wanted mexiletine to suppress PVCs.  2. CAD: s/p redo CABG.  Admission in 3/18 with NSTEMI, LHC showed occlusion of SVG-PDA from CABG#1 but patent SVG-PDA from CABG#2, no intervention.  He had a post-op NSTEMI after inguinal hernia repair in 11/18.  LHC showed occlusion of a PLV branch that had been backfilled by SVG-PDA (prior cath had shown severe diffuse disease in the PLV). Minimal recent chest pain, atypical (related to emotional upset).      - Continue statin => Crestor 10 mg daily.     - Continue ASA 81. 3. Bioprosthetic mitral valve: Stable on 9/24 echo.   - Needs antibiotic prophylaxis with dental work.  4. Atrial flutter: s/p ablation in 8/18.  He is in NSR and off amiodarone.   - Now off warfarin with flutter ablation and no recurrence. If he has recurrence of atrial arrhythmias, based on most recent data DOAC would be a reasonable choice for him even with bioprosthetic  MV.  5. CKD: Stage III.  BMET.   6. Type II diabetes: Followed by endocrinology. 7. CVA: In 1/21, got tPA at the ER in [place].  Symptoms resolved.  CTA head/neck showed no carotid stenosis.  Device interrogation has shown no atrial fibrillation. If atrial fibrillation is noted on future monitoring, will need anticoagulation.  - Continue ASA and Plavix 75 daily.  - Continue Crestor.  8. PVCs: >95% BiV pacing on device check  today, suspect PVC percentage is not high currently.    Followup in 6 months (will see Dr. Royann Shivers in between visits with me).    Signed, Marca Ancona, MD  06/10/2023  [facility] [address] [facility] [place] [address] [phone] (office) [phone] (fax)

## 2023-06-11 ENCOUNTER — Other Ambulatory Visit (HOSPITAL_COMMUNITY): Payer: Self-pay | Admitting: Cardiology

## 2023-06-11 MED ORDER — CARVEDILOL 12.5 MG PO TABS: ORAL_TABLET | ORAL | 3 refills | Status: DC

## 2023-06-11 MED ORDER — AMOXICILLIN 500 MG PO TABS: 2000.0000 mg | ORAL_TABLET | ORAL | 0 refills | Status: DC

## 2023-06-11 MED ORDER — ROSUVASTATIN CALCIUM 10 MG PO TABS: 10.0000 mg | ORAL_TABLET | Freq: Every day | ORAL | 3 refills | Status: AC

## 2023-06-11 MED ORDER — EPLERENONE 50 MG PO TABS: 50.0000 mg | ORAL_TABLET | Freq: Every day | ORAL | 3 refills | Status: DC

## 2023-06-15 ENCOUNTER — Encounter: Payer: Self-pay | Admitting: Cardiology

## 2023-06-16 ENCOUNTER — Other Ambulatory Visit (HOSPITAL_COMMUNITY): Payer: Self-pay | Admitting: Cardiology

## 2023-06-19 ENCOUNTER — Other Ambulatory Visit (HOSPITAL_COMMUNITY): Payer: Self-pay | Admitting: Cardiology

## 2023-06-19 ENCOUNTER — Ambulatory Visit (INDEPENDENT_AMBULATORY_CARE_PROVIDER_SITE_OTHER): Payer: Medicare Other

## 2023-06-19 DIAGNOSIS — I5022 Chronic systolic (congestive) heart failure: Secondary | ICD-10-CM

## 2023-06-21 LAB — CUP PACEART REMOTE DEVICE CHECK
Battery Remaining Longevity: 2 mo
Battery Voltage: 2.76 V
Brady Statistic AP VP Percent: 76.25 %
Brady Statistic AP VS Percent: 0.06 %
Brady Statistic AS VP Percent: 23.47 %
Brady Statistic AS VS Percent: 0.22 %
Brady Statistic RA Percent Paced: 74.03 %
Brady Statistic RV Percent Paced: 95.67 %
Date Time Interrogation Session: 20240917161124
HighPow Impedance: 64 Ohm
Implantable Lead Connection Status: 753985
Implantable Lead Connection Status: 753985
Implantable Lead Connection Status: 753985
Implantable Lead Implant Date: 20171024
Implantable Lead Implant Date: 20171024
Implantable Lead Implant Date: 20171024
Implantable Lead Location: 753858
Implantable Lead Location: 753859
Implantable Lead Location: 753860
Implantable Lead Model: 4598
Implantable Lead Model: 5076
Implantable Pulse Generator Implant Date: 20171024
Lead Channel Impedance Value: 1007 Ohm
Lead Channel Impedance Value: 1026 Ohm
Lead Channel Impedance Value: 257.018
Lead Channel Impedance Value: 257.018
Lead Channel Impedance Value: 260.571
Lead Channel Impedance Value: 294.5 Ohm
Lead Channel Impedance Value: 299.175
Lead Channel Impedance Value: 361 Ohm
Lead Channel Impedance Value: 399 Ohm
Lead Channel Impedance Value: 456 Ohm
Lead Channel Impedance Value: 456 Ohm
Lead Channel Impedance Value: 589 Ohm
Lead Channel Impedance Value: 589 Ohm
Lead Channel Impedance Value: 608 Ohm
Lead Channel Impedance Value: 722 Ohm
Lead Channel Impedance Value: 950 Ohm
Lead Channel Impedance Value: 950 Ohm
Lead Channel Impedance Value: 988 Ohm
Lead Channel Pacing Threshold Amplitude: 0.5 V
Lead Channel Pacing Threshold Amplitude: 1 V
Lead Channel Pacing Threshold Amplitude: 1.25 V
Lead Channel Pacing Threshold Pulse Width: 0.4 ms
Lead Channel Pacing Threshold Pulse Width: 0.4 ms
Lead Channel Pacing Threshold Pulse Width: 1 ms
Lead Channel Sensing Intrinsic Amplitude: 1 mV
Lead Channel Sensing Intrinsic Amplitude: 1 mV
Lead Channel Sensing Intrinsic Amplitude: 21.375 mV
Lead Channel Sensing Intrinsic Amplitude: 21.375 mV
Lead Channel Setting Pacing Amplitude: 1.75 V
Lead Channel Setting Pacing Amplitude: 2 V
Lead Channel Setting Pacing Amplitude: 2 V
Lead Channel Setting Pacing Pulse Width: 0.4 ms
Lead Channel Setting Pacing Pulse Width: 1 ms
Lead Channel Setting Sensing Sensitivity: 0.3 mV
Zone Setting Status: 755011
Zone Setting Status: 755011

## 2023-06-25 ENCOUNTER — Ambulatory Visit: Payer: Medicare Other | Attending: Cardiology

## 2023-06-25 DIAGNOSIS — I5022 Chronic systolic (congestive) heart failure: Secondary | ICD-10-CM | POA: Diagnosis not present

## 2023-06-25 DIAGNOSIS — Z95810 Presence of automatic (implantable) cardiac defibrillator: Secondary | ICD-10-CM

## 2023-06-28 NOTE — Progress Notes (Signed)
EPIC Encounter for ICM Monitoring  Patient Name: Christian Bowman is a 77 y.o. male Date: 06/28/2023 Primary Care Physican: Assunta Found, MD Primary Cardiologist: [facility] Electrophysiologist: Kathreen Cornfield Pacing: 95.9%            09/21/2022 Weight: 183.3 lbs   03/22/2023 Office Weight: 193 lbs    Time in AT/AF 0.0 hr/day (0.0%)   Battery Longevity: 2 months                                                 Spoke with wife and heart failure questions reviewed.  Transmission results reviewed.  Pt asymptomatic for fluid accumulation.  Reports feeling well at this time and voices no complaints.     Diet: Wife records sodium intake by reviewing food labels.     Optivol thoracic impedance suggesting normal fluid levels within the last month.    Prescribed:  Furosemide 20 mg Take 1 tablet (20 mg total) by mouth daily as needed (for a weight over 190 lbs).   Labs: 06/07/2023 Creatinine 1.56, BUN 30, Potassium 4.6, Sodium 137, GFR 46 01/31/2023 Creatinine 1.70, BUN 39, Potassium 4.8, Sodium 139, GFR 42 12/06/2022 Creatinine 1.52, BUN 27, Potassium 4.3, Sodium 139, GFR 47  A complete set of results can be found in Results Review.   Recommendations:  No changes and encouraged to call if pt is experiencing any fluid symptoms.   Follow-up plan: ICM clinic phone appointment on 07/30/2023.   91 day device clinic remote transmission 07/24/2023.     EP/Cardiology Office Visits:  Recall 12/04/2023 with Dr. Shirlee Latch.   Recall 12/17/2023 with Dr Royann Shivers.   08/21/2023 with Dr Elberta Fortis.   Copy of ICM check sent to Dr. Elberta Fortis.  3 month ICM trend: 06/22/2023.    12-14 Month ICM trend:     [name], RN 06/28/2023 1:56 PM

## 2023-07-05 NOTE — Progress Notes (Signed)
Remote ICD transmission.   

## 2023-07-05 NOTE — Addendum Note (Signed)
Addended by: Geralyn Flash D on: 07/05/2023 10:58 AM   Modules accepted: Level of Service

## 2023-07-21 ENCOUNTER — Other Ambulatory Visit: Payer: Self-pay | Admitting: "Endocrinology

## 2023-07-24 ENCOUNTER — Ambulatory Visit (INDEPENDENT_AMBULATORY_CARE_PROVIDER_SITE_OTHER): Payer: Medicare Other

## 2023-07-24 DIAGNOSIS — I5022 Chronic systolic (congestive) heart failure: Secondary | ICD-10-CM | POA: Diagnosis not present

## 2023-07-25 LAB — CUP PACEART REMOTE DEVICE CHECK
Battery Remaining Longevity: 2 mo
Battery Voltage: 2.74 V
Brady Statistic AP VP Percent: 77.02 %
Brady Statistic AP VS Percent: 0.12 %
Brady Statistic AS VP Percent: 22.45 %
Brady Statistic AS VS Percent: 0.41 %
Brady Statistic RA Percent Paced: 74.95 %
Brady Statistic RV Percent Paced: 95.67 %
Date Time Interrogation Session: 20241022001804
HighPow Impedance: 60 Ohm
Implantable Lead Connection Status: 753985
Implantable Lead Connection Status: 753985
Implantable Lead Connection Status: 753985
Implantable Lead Implant Date: 20171024
Implantable Lead Implant Date: 20171024
Implantable Lead Implant Date: 20171024
Implantable Lead Location: 753858
Implantable Lead Location: 753859
Implantable Lead Location: 753860
Implantable Lead Model: 4598
Implantable Lead Model: 5076
Implantable Pulse Generator Implant Date: 20171024
Lead Channel Impedance Value: 1026 Ohm
Lead Channel Impedance Value: 1026 Ohm
Lead Channel Impedance Value: 249.509
Lead Channel Impedance Value: 249.509
Lead Channel Impedance Value: 257.018
Lead Channel Impedance Value: 275.5 Ohm
Lead Channel Impedance Value: 284.683
Lead Channel Impedance Value: 304 Ohm
Lead Channel Impedance Value: 399 Ohm
Lead Channel Impedance Value: 418 Ohm
Lead Channel Impedance Value: 456 Ohm
Lead Channel Impedance Value: 551 Ohm
Lead Channel Impedance Value: 551 Ohm
Lead Channel Impedance Value: 589 Ohm
Lead Channel Impedance Value: 722 Ohm
Lead Channel Impedance Value: 931 Ohm
Lead Channel Impedance Value: 931 Ohm
Lead Channel Impedance Value: 950 Ohm
Lead Channel Pacing Threshold Amplitude: 0.5 V
Lead Channel Pacing Threshold Amplitude: 1 V
Lead Channel Pacing Threshold Amplitude: 1 V
Lead Channel Pacing Threshold Pulse Width: 0.4 ms
Lead Channel Pacing Threshold Pulse Width: 0.4 ms
Lead Channel Pacing Threshold Pulse Width: 1 ms
Lead Channel Sensing Intrinsic Amplitude: 0.875 mV
Lead Channel Sensing Intrinsic Amplitude: 0.875 mV
Lead Channel Sensing Intrinsic Amplitude: 25 mV
Lead Channel Sensing Intrinsic Amplitude: 25 mV
Lead Channel Setting Pacing Amplitude: 1.5 V
Lead Channel Setting Pacing Amplitude: 2 V
Lead Channel Setting Pacing Amplitude: 2 V
Lead Channel Setting Pacing Pulse Width: 0.4 ms
Lead Channel Setting Pacing Pulse Width: 1 ms
Lead Channel Setting Sensing Sensitivity: 0.3 mV
Zone Setting Status: 755011
Zone Setting Status: 755011

## 2023-07-30 ENCOUNTER — Ambulatory Visit: Payer: Medicare Other | Attending: Cardiology

## 2023-07-30 DIAGNOSIS — I5022 Chronic systolic (congestive) heart failure: Secondary | ICD-10-CM | POA: Diagnosis not present

## 2023-07-30 DIAGNOSIS — Z95810 Presence of automatic (implantable) cardiac defibrillator: Secondary | ICD-10-CM

## 2023-07-31 ENCOUNTER — Other Ambulatory Visit (HOSPITAL_COMMUNITY): Payer: Self-pay | Admitting: Cardiology

## 2023-08-01 MED ORDER — AMOXICILLIN 500 MG PO TABS: 2000.0000 mg | ORAL_TABLET | ORAL | 0 refills | Status: DC

## 2023-08-03 ENCOUNTER — Telehealth: Payer: Self-pay

## 2023-08-03 NOTE — Progress Notes (Signed)
EPIC Encounter for ICM Monitoring  Patient Name: Christian Bowman is a 77 y.o. male Date: 08/03/2023 Primary Care Physican: Assunta Found, MD Primary Cardiologist: [facility] Electrophysiologist: Kathreen Cornfield Pacing: 95.1%            09/21/2022 Weight: 183.3 lbs   03/22/2023 Office Weight: 193 lbs    Time in AT/AF 0.0 hr/day (0.0%)   Battery Longevity: 2 months                                                 Attempted call to wife/patient and unable to reach.  Left detailed message per DPR regarding transmission. Transmission reviewed.    Diet: Wife records sodium intake by reviewing food labels.     Optivol thoracic impedance suggesting intermittent days with possible fluid accumulation within the last month.    Prescribed:  Furosemide 20 mg Take 1 tablet (20 mg total) by mouth daily as needed (for a weight over 190 lbs).   Labs: 06/07/2023 Creatinine 1.56, BUN 30, Potassium 4.6, Sodium 137, GFR 46 01/31/2023 Creatinine 1.70, BUN 39, Potassium 4.8, Sodium 139, GFR 42 12/06/2022 Creatinine 1.52, BUN 27, Potassium 4.3, Sodium 139, GFR 47  A complete set of results can be found in Results Review.   Recommendations:  Left voice mail with ICM number and encouraged to call if experiencing any fluid symptoms.   Follow-up plan: ICM clinic phone appointment on 09/03/2023.   91 day device clinic remote transmission 10/23/2023.     EP/Cardiology Office Visits:  Recall 12/04/2023 with Dr. Shirlee Latch.   Recall 12/17/2023 with Dr Royann Shivers.   08/21/2023 with Dr Elberta Fortis.   Copy of ICM check sent to Dr. Elberta Fortis.  3 month ICM trend: 07/31/2023.    12-14 Month ICM trend:     [name], RN 08/03/2023 12:54 PM

## 2023-08-03 NOTE — Telephone Encounter (Signed)
Remote ICM transmission received.  Attempted call to patient regarding ICM remote transmission and left detailed message per DPR.  Left ICM phone number and advised to return call for any fluid symptoms or questions. Next ICM remote transmission scheduled 09/03/2023.

## 2023-08-06 NOTE — Telephone Encounter (Signed)
Spoke with wife and transmission results reviewed.  Pt is feeling well and has no complaints at this time.  He did take PRN Lasix a couple of times in the last month due to SOB which resolved. Advised next ICM remote transmission is 12/2.  Encouraged to call if condition changes.

## 2023-08-13 ENCOUNTER — Telehealth: Payer: Self-pay

## 2023-08-13 NOTE — Progress Notes (Signed)
Remote ICD transmission.   

## 2023-08-13 NOTE — Telephone Encounter (Signed)
Alert received from [employer]:  Device alert for RRT reached 11/11

## 2023-08-13 NOTE — Telephone Encounter (Signed)
Contacted pt. Spoke with pt's wife and advised about pt's device reaching RRT. Advised of upcoming appointment in clinic with Dr. Elberta Fortis, and that gen change instructions will be given and discussed.

## 2023-08-20 ENCOUNTER — Ambulatory Visit (INDEPENDENT_AMBULATORY_CARE_PROVIDER_SITE_OTHER): Payer: Medicare Other

## 2023-08-20 DIAGNOSIS — I5022 Chronic systolic (congestive) heart failure: Secondary | ICD-10-CM

## 2023-08-21 ENCOUNTER — Encounter: Payer: Self-pay | Admitting: Cardiology

## 2023-08-21 ENCOUNTER — Encounter: Payer: Self-pay | Admitting: *Deleted

## 2023-08-21 ENCOUNTER — Ambulatory Visit: Payer: Medicare Other | Attending: Cardiology | Admitting: Cardiology

## 2023-08-21 VITALS — BP 116/82 | HR 80 | Ht 72.0 in | Wt 193.6 lb

## 2023-08-21 DIAGNOSIS — I442 Atrioventricular block, complete: Secondary | ICD-10-CM | POA: Insufficient documentation

## 2023-08-21 DIAGNOSIS — I2510 Atherosclerotic heart disease of native coronary artery without angina pectoris: Secondary | ICD-10-CM | POA: Diagnosis not present

## 2023-08-21 DIAGNOSIS — I483 Typical atrial flutter: Secondary | ICD-10-CM | POA: Insufficient documentation

## 2023-08-21 DIAGNOSIS — Z01812 Encounter for preprocedural laboratory examination: Secondary | ICD-10-CM | POA: Diagnosis present

## 2023-08-21 DIAGNOSIS — I5022 Chronic systolic (congestive) heart failure: Secondary | ICD-10-CM | POA: Diagnosis present

## 2023-08-21 LAB — CUP PACEART REMOTE DEVICE CHECK
Battery Remaining Longevity: 1 mo
Battery Voltage: 2.71 V
Brady Statistic AP VP Percent: 74.56 %
Brady Statistic AP VS Percent: 0.1 %
Brady Statistic AS VP Percent: 24.99 %
Brady Statistic AS VS Percent: 0.35 %
Brady Statistic RA Percent Paced: 72.29 %
Brady Statistic RV Percent Paced: 95.17 %
Date Time Interrogation Session: 20241119043724
HighPow Impedance: 55 Ohm
Implantable Lead Connection Status: 753985
Implantable Lead Connection Status: 753985
Implantable Lead Connection Status: 753985
Implantable Lead Implant Date: 20171024
Implantable Lead Implant Date: 20171024
Implantable Lead Implant Date: 20171024
Implantable Lead Location: 753858
Implantable Lead Location: 753859
Implantable Lead Location: 753860
Implantable Lead Model: 4598
Implantable Lead Model: 5076
Implantable Pulse Generator Implant Date: 20171024
Lead Channel Impedance Value: 1026 Ohm
Lead Channel Impedance Value: 255.093
Lead Channel Impedance Value: 262.946
Lead Channel Impedance Value: 262.946
Lead Channel Impedance Value: 284.683
Lead Channel Impedance Value: 284.683
Lead Channel Impedance Value: 304 Ohm
Lead Channel Impedance Value: 399 Ohm
Lead Channel Impedance Value: 418 Ohm
Lead Channel Impedance Value: 475 Ohm
Lead Channel Impedance Value: 551 Ohm
Lead Channel Impedance Value: 589 Ohm
Lead Channel Impedance Value: 589 Ohm
Lead Channel Impedance Value: 722 Ohm
Lead Channel Impedance Value: 931 Ohm
Lead Channel Impedance Value: 950 Ohm
Lead Channel Impedance Value: 950 Ohm
Lead Channel Impedance Value: 988 Ohm
Lead Channel Pacing Threshold Amplitude: 0.5 V
Lead Channel Pacing Threshold Amplitude: 0.875 V
Lead Channel Pacing Threshold Amplitude: 1 V
Lead Channel Pacing Threshold Pulse Width: 0.4 ms
Lead Channel Pacing Threshold Pulse Width: 0.4 ms
Lead Channel Pacing Threshold Pulse Width: 1 ms
Lead Channel Sensing Intrinsic Amplitude: 1.125 mV
Lead Channel Sensing Intrinsic Amplitude: 1.125 mV
Lead Channel Sensing Intrinsic Amplitude: 25 mV
Lead Channel Sensing Intrinsic Amplitude: 25 mV
Lead Channel Setting Pacing Amplitude: 2 V
Lead Channel Setting Pacing Amplitude: 2 V
Lead Channel Setting Pacing Amplitude: 2 V
Lead Channel Setting Pacing Pulse Width: 0.4 ms
Lead Channel Setting Pacing Pulse Width: 1 ms
Lead Channel Setting Sensing Sensitivity: 0.3 mV
Zone Setting Status: 755011
Zone Setting Status: 755011

## 2023-08-21 LAB — CUP PACEART INCLINIC DEVICE CHECK
Brady Statistic RA Percent Paced: 76 %
Date Time Interrogation Session: 20241119110638
HighPow Impedance: 64 Ohm
Implantable Lead Connection Status: 753985
Implantable Lead Connection Status: 753985
Implantable Lead Connection Status: 753985
Implantable Lead Implant Date: 20171024
Implantable Lead Implant Date: 20171024
Implantable Lead Implant Date: 20171024
Implantable Lead Location: 753858
Implantable Lead Location: 753859
Implantable Lead Location: 753860
Implantable Lead Model: 4598
Implantable Lead Model: 5076
Implantable Pulse Generator Implant Date: 20171024
Lead Channel Impedance Value: 1083 Ohm
Lead Channel Impedance Value: 418 Ohm
Lead Channel Impedance Value: 456 Ohm
Lead Channel Pacing Threshold Amplitude: 0.5 V
Lead Channel Pacing Threshold Amplitude: 1 V
Lead Channel Pacing Threshold Amplitude: 1.25 V
Lead Channel Pacing Threshold Pulse Width: 0.4 ms
Lead Channel Pacing Threshold Pulse Width: 0.4 ms
Lead Channel Pacing Threshold Pulse Width: 1 ms
Lead Channel Sensing Intrinsic Amplitude: 0.9 mV

## 2023-08-21 NOTE — Patient Instructions (Addendum)
Medication Instructions:  Your physician recommends that you continue on your current medications as directed. Please refer to the Current Medication list given to you today.  *If you need a refill on your cardiac medications before your next appointment, please call your pharmacy*   Lab Work: Pre procedure labs today: BMET & CBC  If you have any lab test that is abnormal or we need to change your treatment, we will call you to review the results.   Testing/Procedures: None ordered   Follow-Up: At [facility], you and your health needs are our priority.  As part of our continuing mission to provide you with exceptional heart care, we have created designated Provider Care Teams.  These Care Teams include your primary Cardiologist (physician) and Advanced Practice Providers (APPs -  Physician Assistants and Nurse Practitioners) who all work together to provide you with the care you need, when you need it.  Your next appointment:   2 week(s)  The format for your next appointment:   In Person  Provider:   Device clinic for a wound check {    Thank you for choosing [facility]!!   Dory Horn, RN [phone]

## 2023-08-21 NOTE — H&P (View-Only) (Signed)
Electrophysiology Office Note:   Date:  08/21/2023  ID:  Christian Bowman, DOB Sep 17, 1946, MRN [id number]  Primary Cardiologist: Marca Ancona, MD Electrophysiologist: Regan Lemming, MD      History of Present Illness:   Christian Bowman is a 77 y.o. male with h/o coronary artery disease post CABG in 2007 with redo CABG 2017 and mitral valve replacement, chronic systolic heart failure, complete heart block, atrial flutter seen today for routine electrophysiology followup.   Since last being seen in our clinic the patient reports doing overall well.  He has no chest pain or shortness of breath.  He continues to go to rehab and exercises regularly.  He has no acute complaints.  he denies chest pain, palpitations, dyspnea, PND, orthopnea, nausea, vomiting, dizziness, syncope, edema, weight gain, or early satiety.   Review of systems complete and found to be negative unless listed in HPI.      EP Information / Studies Reviewed:    EKG is not ordered today. EKG from 06/07/23 reviewed which showed AV paced      ICD Interrogation-  reviewed in detail today,  See [facility] report.  Device History: Medtronic BiV ICD implanted 07/25/2016 for CHF and complete heart block History of appropriate therapy: No History of AAD therapy: No   Risk Assessment/Calculations:              Physical Exam:   VS:  BP 116/82 (BP Location: Left Arm, Patient Position: Sitting, Cuff Size: Large)   Pulse 80   Ht 6' (1.829 m)   Wt 193 lb 9.6 oz (87.8 kg)   SpO2 98%   BMI 26.26 kg/m    Wt Readings from Last 3 Encounters:  08/21/23 193 lb 9.6 oz (87.8 kg)  06/07/23 191 lb 9.6 oz (86.9 kg)  03/22/23 193 lb 6.4 oz (87.7 kg)     GEN: Well nourished, well developed in no acute distress NECK: No JVD; No carotid bruits CARDIAC: Regular rate and rhythm, no murmurs, rubs, gallops RESPIRATORY:  Clear to auscultation without rales, wheezing or rhonchi  ABDOMEN: Soft, non-tender, non-distended EXTREMITIES:  No  edema; No deformity   ASSESSMENT AND PLAN:    Chronic systolic dysfunction s/p Medtronic CRT-D  euvolemic today Stable on an appropriate medical regimen Normal ICD function See [facility] [employer] report Device is at [facility].  He would benefit from generator change.  Risk and benefits have been discussed.  Risk include bleeding, infection, tamponade, pneumothorax, lead dislodgment, MI, death, stroke.  He understands these risks and is agreed to the procedure.  2.  Complete heart block: Post Medtronic CRT-D.  Device functioning appropriately.  No changes.  3.  Typical atrial flutter: Post ablation.  No recurrences.  4.  Mitral regurgitation: Post bioprosthetic mitral valve replacement.  Plan per primary cardiology.  5.  Coronary artery disease: Post CABG.  No angina.  Plan per primary cardiology  6.  Nonsustained VT: Found on device interrogation.  Currently on carvedilol.  No changes.  Disposition:   Follow up with Dr. Elberta Fortis as usual post procedure   Signed, Alexie Samson Jorja Loa, MD

## 2023-08-21 NOTE — Addendum Note (Signed)
Addended by: Baird Lyons on: 08/21/2023 11:45 AM   Modules accepted: Orders

## 2023-08-21 NOTE — Progress Notes (Signed)
  Electrophysiology Office Note:   Date:  08/21/2023  ID:  KAHRI ARCHIE, DOB 11/01/1945, MRN [id number]  Primary Cardiologist: Marca Ancona, MD Electrophysiologist: Regan Lemming, MD      History of Present Illness:   Christian Bowman is a 77 y.o. male with h/o coronary artery disease post CABG in 2007 with redo CABG 2017 and mitral valve replacement, chronic systolic heart failure, complete heart block, atrial flutter seen today for routine electrophysiology followup.   Since last being seen in our clinic the patient reports doing overall well.  He has no chest pain or shortness of breath.  He continues to go to rehab and exercises regularly.  He has no acute complaints.  he denies chest pain, palpitations, dyspnea, PND, orthopnea, nausea, vomiting, dizziness, syncope, edema, weight gain, or early satiety.   Review of systems complete and found to be negative unless listed in HPI.      EP Information / Studies Reviewed:    EKG is not ordered today. EKG from 06/07/23 reviewed which showed AV paced      ICD Interrogation-  reviewed in detail today,  See [facility] report.  Device History: Medtronic BiV ICD implanted 07/25/2016 for CHF and complete heart block History of appropriate therapy: No History of AAD therapy: No   Risk Assessment/Calculations:              Physical Exam:   VS:  BP 116/82 (BP Location: Left Arm, Patient Position: Sitting, Cuff Size: Large)   Pulse 80   Ht 6' (1.829 m)   Wt 193 lb 9.6 oz (87.8 kg)   SpO2 98%   BMI 26.26 kg/m    Wt Readings from Last 3 Encounters:  08/21/23 193 lb 9.6 oz (87.8 kg)  06/07/23 191 lb 9.6 oz (86.9 kg)  03/22/23 193 lb 6.4 oz (87.7 kg)     GEN: Well nourished, well developed in no acute distress NECK: No JVD; No carotid bruits CARDIAC: Regular rate and rhythm, no murmurs, rubs, gallops RESPIRATORY:  Clear to auscultation without rales, wheezing or rhonchi  ABDOMEN: Soft, non-tender, non-distended EXTREMITIES:  No  edema; No deformity   ASSESSMENT AND PLAN:    Chronic systolic dysfunction s/p Medtronic CRT-D  euvolemic today Stable on an appropriate medical regimen Normal ICD function See [facility]rita Miss [employer] report Device is at [facility].  He would benefit from generator change.  Risk and benefits have been discussed.  Risk include bleeding, infection, tamponade, pneumothorax, lead dislodgment, MI, death, stroke.  He understands these risks and is agreed to the procedure.  2.  Complete heart block: Post Medtronic CRT-D.  Device functioning appropriately.  No changes.  3.  Typical atrial flutter: Post ablation.  No recurrences.  4.  Mitral regurgitation: Post bioprosthetic mitral valve replacement.  Plan per primary cardiology.  5.  Coronary artery disease: Post CABG.  No angina.  Plan per primary cardiology  6.  Nonsustained VT: Found on device interrogation.  Currently on carvedilol.  No changes.  Disposition:   Follow up with Dr. Elberta Fortis as usual post procedure   Signed, Kathlen Sakurai Jorja Loa, MD

## 2023-08-22 LAB — CBC
Hematocrit: 48.3 % (ref 37.5–51.0)
Hemoglobin: 16 g/dL (ref 13.0–17.7)
MCH: 31.1 pg (ref 26.6–33.0)
MCHC: 33.1 g/dL (ref 31.5–35.7)
MCV: 94 fL (ref 79–97)
Platelets: 132 10*3/uL — ABNORMAL LOW (ref 150–450)
RBC: 5.14 x10E6/uL (ref 4.14–5.80)
RDW: 12.5 % (ref 11.6–15.4)
WBC: 6.3 10*3/uL (ref 3.4–10.8)

## 2023-08-22 LAB — COMPREHENSIVE METABOLIC PANEL
ALT: 34 [IU]/L (ref 0–44)
AST: 22 [IU]/L (ref 0–40)
Albumin: 4.3 g/dL (ref 3.8–4.8)
Alkaline Phosphatase: 78 [IU]/L (ref 44–121)
BUN/Creatinine Ratio: 22 (ref 10–24)
BUN: 36 mg/dL — ABNORMAL HIGH (ref 8–27)
Bilirubin Total: 0.8 mg/dL (ref 0.0–1.2)
CO2: 24 mmol/L (ref 20–29)
Calcium: 9.4 mg/dL (ref 8.6–10.2)
Chloride: 103 mmol/L (ref 96–106)
Creatinine, Ser: 1.67 mg/dL — ABNORMAL HIGH (ref 0.76–1.27)
Globulin, Total: 2.5 g/dL (ref 1.5–4.5)
Glucose: 97 mg/dL (ref 70–99)
Potassium: 4.5 mmol/L (ref 3.5–5.2)
Sodium: 140 mmol/L (ref 134–144)
Total Protein: 6.8 g/dL (ref 6.0–8.5)
eGFR: 42 mL/min/{1.73_m2} — ABNORMAL LOW (ref >59)

## 2023-08-22 LAB — BASIC METABOLIC PANEL
BUN/Creatinine Ratio: 22 (ref 10–24)
BUN: 37 mg/dL — ABNORMAL HIGH (ref 8–27)
CO2: 23 mmol/L (ref 20–29)
Calcium: 9.3 mg/dL (ref 8.6–10.2)
Chloride: 101 mmol/L (ref 96–106)
Creatinine, Ser: 1.67 mg/dL — ABNORMAL HIGH (ref 0.76–1.27)
Glucose: 98 mg/dL (ref 70–99)
Potassium: 4.6 mmol/L (ref 3.5–5.2)
Sodium: 140 mmol/L (ref 134–144)
eGFR: 42 mL/min/{1.73_m2} — ABNORMAL LOW (ref >59)

## 2023-08-22 LAB — TSH: TSH: 2.25 u[IU]/mL (ref 0.450–4.500)

## 2023-08-22 LAB — LIPID PANEL
Chol/HDL Ratio: 3.5 {ratio} (ref 0.0–5.0)
Cholesterol, Total: 129 mg/dL (ref 100–199)
HDL: 37 mg/dL — ABNORMAL LOW (ref >39)
LDL Chol Calc (NIH): 76 mg/dL (ref 0–99)
Triglycerides: 82 mg/dL (ref 0–149)
VLDL Cholesterol Cal: 16 mg/dL (ref 5–40)

## 2023-08-22 LAB — T4, FREE: Free T4: 1.62 ng/dL (ref 0.82–1.77)

## 2023-08-28 ENCOUNTER — Telehealth: Payer: Self-pay

## 2023-08-28 NOTE — Telephone Encounter (Signed)
Due to another schedule change we have moved the pt back to his original scheduled time at 130. His Wife is aware of this and will have him there at 11:30 am.

## 2023-08-28 NOTE — Telephone Encounter (Signed)
Pt's Wife is aware of procedure time change on 11/27 being changed. She will have him there at 9:00 am.

## 2023-08-29 ENCOUNTER — Encounter (HOSPITAL_COMMUNITY): Payer: Self-pay | Admitting: Cardiology

## 2023-08-29 ENCOUNTER — Ambulatory Visit (HOSPITAL_COMMUNITY)
Admission: RE | Admit: 2023-08-29 | Discharge: 2023-08-29 | Disposition: A | Discharge status: Home / Self Care | Payer: Medicare Other | Source: Ambulatory Visit | Attending: Cardiology | Admitting: Cardiology

## 2023-08-29 ENCOUNTER — Ambulatory Visit (HOSPITAL_COMMUNITY)
Admission: RE | Disposition: A | Discharge status: Home / Self Care | Payer: Self-pay | Source: Ambulatory Visit | Attending: Cardiology

## 2023-08-29 ENCOUNTER — Ambulatory Visit: Payer: Medicare Other | Admitting: "Endocrinology

## 2023-08-29 ENCOUNTER — Other Ambulatory Visit: Payer: Self-pay

## 2023-08-29 DIAGNOSIS — I340 Nonrheumatic mitral (valve) insufficiency: Secondary | ICD-10-CM | POA: Insufficient documentation

## 2023-08-29 DIAGNOSIS — I472 Ventricular tachycardia, unspecified: Secondary | ICD-10-CM | POA: Insufficient documentation

## 2023-08-29 DIAGNOSIS — I442 Atrioventricular block, complete: Secondary | ICD-10-CM | POA: Diagnosis not present

## 2023-08-29 DIAGNOSIS — Z951 Presence of aortocoronary bypass graft: Secondary | ICD-10-CM | POA: Insufficient documentation

## 2023-08-29 DIAGNOSIS — Z953 Presence of xenogenic heart valve: Secondary | ICD-10-CM | POA: Diagnosis not present

## 2023-08-29 DIAGNOSIS — I483 Typical atrial flutter: Secondary | ICD-10-CM | POA: Diagnosis not present

## 2023-08-29 DIAGNOSIS — Z79899 Other long term (current) drug therapy: Secondary | ICD-10-CM | POA: Diagnosis not present

## 2023-08-29 DIAGNOSIS — I2510 Atherosclerotic heart disease of native coronary artery without angina pectoris: Secondary | ICD-10-CM | POA: Diagnosis not present

## 2023-08-29 DIAGNOSIS — Z4502 Encounter for adjustment and management of automatic implantable cardiac defibrillator: Secondary | ICD-10-CM | POA: Insufficient documentation

## 2023-08-29 DIAGNOSIS — I5022 Chronic systolic (congestive) heart failure: Secondary | ICD-10-CM | POA: Diagnosis not present

## 2023-08-29 HISTORY — PX: BIV ICD GENERATOR CHANGEOUT: EP1194

## 2023-08-29 LAB — GLUCOSE, CAPILLARY: Glucose-Capillary: 144 mg/dL — ABNORMAL HIGH (ref 70–99)

## 2023-08-29 SURGERY — BIV ICD GENERATOR CHANGEOUT

## 2023-08-29 MED ORDER — ONDANSETRON HCL 4 MG/2ML IJ SOLN: 4.0000 mg | Freq: Four times a day (QID) | INTRAMUSCULAR | Status: DC | PRN

## 2023-08-29 MED ORDER — FENTANYL CITRATE (PF) 100 MCG/2ML IJ SOLN
INTRAMUSCULAR | Status: AC
  Filled 2023-08-29: qty 2

## 2023-08-29 MED ORDER — SODIUM CHLORIDE 0.9 % IV SOLN
INTRAVENOUS | Status: AC
  Filled 2023-08-29: qty 2

## 2023-08-29 MED ORDER — CHLORHEXIDINE GLUCONATE 4 % EX SOLN
4.0000 | Freq: Once | CUTANEOUS | Status: AC
  Administered 2023-08-29: 4 via TOPICAL
  Filled 2023-08-29: qty 60

## 2023-08-29 MED ORDER — SODIUM CHLORIDE 0.9 % IV SOLN
INTRAVENOUS | Status: DC
Start: 2023-08-29 — End: 2023-08-29

## 2023-08-29 MED ORDER — LIDOCAINE HCL 1 % IJ SOLN
INTRAMUSCULAR | Status: AC
  Filled 2023-08-29: qty 60

## 2023-08-29 MED ORDER — MIDAZOLAM HCL 5 MG/5ML IJ SOLN
INTRAMUSCULAR | Status: AC
  Filled 2023-08-29: qty 5

## 2023-08-29 MED ORDER — CEFAZOLIN SODIUM-DEXTROSE 2-4 GM/100ML-% IV SOLN
2.0000 g | INTRAVENOUS | Status: AC
  Administered 2023-08-29: 2 g via INTRAVENOUS

## 2023-08-29 MED ORDER — LIDOCAINE HCL (PF) 1 % IJ SOLN
INTRAMUSCULAR | Status: DC | PRN
  Administered 2023-08-29: 50 mL

## 2023-08-29 MED ORDER — FENTANYL CITRATE (PF) 100 MCG/2ML IJ SOLN
INTRAMUSCULAR | Status: DC | PRN
  Administered 2023-08-29 (×2): 25 ug via INTRAVENOUS

## 2023-08-29 MED ORDER — CEFAZOLIN SODIUM-DEXTROSE 2-4 GM/100ML-% IV SOLN
INTRAVENOUS | Status: AC
  Filled 2023-08-29: qty 100

## 2023-08-29 MED ORDER — POVIDONE-IODINE 10 % EX SWAB
2.0000 | Freq: Once | CUTANEOUS | Status: AC
  Administered 2023-08-29: 2 via TOPICAL

## 2023-08-29 MED ORDER — ACETAMINOPHEN 325 MG PO TABS: 325.0000 mg | ORAL_TABLET | ORAL | Status: DC | PRN

## 2023-08-29 MED ORDER — SODIUM CHLORIDE 0.9 % IV SOLN
80.0000 mg | INTRAVENOUS | Status: AC
  Administered 2023-08-29: 80 mg

## 2023-08-29 MED ORDER — MIDAZOLAM HCL 5 MG/5ML IJ SOLN
INTRAMUSCULAR | Status: DC | PRN
  Administered 2023-08-29 (×2): 1 mg via INTRAVENOUS

## 2023-08-29 SURGICAL SUPPLY — 6 items
CABLE SURGICAL S-101-97-12 (CABLE) ×1 IMPLANT
ICD COBALT XT QUAD CRT DTPA2QQ (ICD Generator) IMPLANT
PAD DEFIB RADIO PHYSIO CONN (PAD) ×1 IMPLANT
POUCH AIGIS-R ANTIBACT ICD (Mesh General) ×1 IMPLANT
POUCH AIGIS-R ANTIBACT ICD LRG (Mesh General) IMPLANT
TRAY PACEMAKER INSERTION (PACKS) ×1 IMPLANT

## 2023-08-29 NOTE — Discharge Instructions (Signed)

## 2023-08-29 NOTE — Interval H&P Note (Signed)
History and Physical Interval Note:  08/29/2023 12:29 PM  Christian Bowman  has presented today for surgery, with the diagnosis of heart block - heart failure.  The various methods of treatment have been discussed with the patient and family. After consideration of risks, benefits and other options for treatment, the patient has consented to  Procedure(s): BIV ICD GENERATOR CHANGEOUT (N/A) as a surgical intervention.  The patient's history has been reviewed, patient examined, no change in status, stable for surgery.  I have reviewed the patient's chart and labs.  Questions were answered to the patient's satisfaction.     Xiong Haidar [employer]

## 2023-08-31 MED FILL — Lidocaine HCl Local Inj 1%: INTRAMUSCULAR | Qty: 50 | Status: AC

## 2023-09-04 NOTE — Progress Notes (Signed)
No ICM remote transmission received for 09/03/2023 due to device battery replacement.  Next ICM transmission scheduled for 10/15/2023 to allow for development of Optivol fluid levels.

## 2023-09-13 ENCOUNTER — Encounter: Payer: Self-pay | Admitting: "Endocrinology

## 2023-09-13 ENCOUNTER — Ambulatory Visit: Payer: Medicare Other | Attending: Cardiology

## 2023-09-13 ENCOUNTER — Ambulatory Visit: Payer: Medicare Other | Admitting: "Endocrinology

## 2023-09-13 VITALS — BP 118/68 | HR 64 | Ht 72.0 in | Wt 192.6 lb

## 2023-09-13 DIAGNOSIS — I10 Essential (primary) hypertension: Secondary | ICD-10-CM

## 2023-09-13 DIAGNOSIS — Z7984 Long term (current) use of oral hypoglycemic drugs: Secondary | ICD-10-CM

## 2023-09-13 DIAGNOSIS — E1122 Type 2 diabetes mellitus with diabetic chronic kidney disease: Secondary | ICD-10-CM

## 2023-09-13 DIAGNOSIS — E782 Mixed hyperlipidemia: Secondary | ICD-10-CM | POA: Diagnosis not present

## 2023-09-13 DIAGNOSIS — I442 Atrioventricular block, complete: Secondary | ICD-10-CM | POA: Insufficient documentation

## 2023-09-13 DIAGNOSIS — E039 Hypothyroidism, unspecified: Secondary | ICD-10-CM | POA: Diagnosis not present

## 2023-09-13 DIAGNOSIS — N1831 Chronic kidney disease, stage 3a: Secondary | ICD-10-CM

## 2023-09-13 LAB — CUP PACEART INCLINIC DEVICE CHECK
Date Time Interrogation Session: 20241212163707
Implantable Lead Connection Status: 753985
Implantable Lead Connection Status: 753985
Implantable Lead Connection Status: 753985
Implantable Lead Implant Date: 20171024
Implantable Lead Implant Date: 20171024
Implantable Lead Implant Date: 20171024
Implantable Lead Location: 753858
Implantable Lead Location: 753859
Implantable Lead Location: 753860
Implantable Lead Model: 4598
Implantable Lead Model: 5076
Implantable Pulse Generator Implant Date: 20241127

## 2023-09-13 LAB — POCT GLYCOSYLATED HEMOGLOBIN (HGB A1C): HbA1c, POC (controlled diabetic range): 6.7 % (ref 0.0–7.0)

## 2023-09-13 MED ORDER — LEVOTHYROXINE SODIUM 75 MCG PO TABS: 75.0000 ug | ORAL_TABLET | Freq: Every day | ORAL | 1 refills | Status: DC

## 2023-09-13 MED ORDER — GLIPIZIDE ER 2.5 MG PO TB24: 2.5000 mg | ORAL_TABLET | Freq: Every day | ORAL | 1 refills | Status: DC

## 2023-09-13 NOTE — Patient Instructions (Signed)
   After Your ICD (Implantable Cardiac Defibrillator)    Monitor your defibrillator site for redness, swelling, and drainage. Call the device clinic at [phone] if you experience these symptoms or fever/chills.  Your incision was closed with Steri-strips or staples:  You may shower 7 days after your procedure and wash your incision with soap and water. Avoid lotions, ointments, or perfumes over your incision until it is well-healed.  You may use a hot tub or a pool after your wound check appointment if the incision is completely closed.   Your ICD is designed to protect you from life threatening heart rhythms. Because of this, you may receive a shock.   1 shock with no symptoms:  Call the office during business hours. 1 shock with symptoms (chest pain, chest pressure, dizziness, lightheadedness, shortness of breath, overall feeling unwell):  Call 911. If you experience 2 or more shocks in 24 hours:  Call 911. If you receive a shock, you should not drive.  [place] DMV - no driving for 6 months if you receive appropriate therapy from your ICD.   ICD Alerts:  Some alerts are vibratory and others beep. These are NOT emergencies. Please call our office to let us know. If this occurs at night or on weekends, it can wait until the next business day. Send a remote transmission.  If your device is capable of reading fluid status (for heart failure), you will be offered monthly monitoring to review this with you.   Remote monitoring is used to monitor your ICD from home. This monitoring is scheduled every 91 days by our office. It allows us to keep an eye on the functioning of your device to ensure it is working properly. You will routinely see your Electrophysiologist annually (more often if necessary).  

## 2023-09-13 NOTE — Progress Notes (Signed)
09/13/2023          Endocrinology follow-up note   Subjective:    Patient ID: Christian Bowman, male    DOB: 1946/01/18,    Past Medical History:  Diagnosis Date   AICD (automatic cardioverter/defibrillator) present    medtronic-   DR. CROITORU , DR. Shirlee Latch    Anginal pain (HCC)    cp sat 08/11/17   Anxiety    CAD (coronary artery disease)    CHF (congestive heart failure) (HCC)    Complication of anesthesia    took awhile to wake up    Coronary artery disease involving coronary bypass graft    Cyst of neck    right side   DM2 (diabetes mellitus, type 2) (HCC) 08/26/2013   Dyspnea    Heart attack (HCC)    "not sure when" (08/20/2017)   HTN (hypertension) 08/26/2013   Hyperlipidemia 08/26/2013   Hypothyroidism    Left main coronary artery disease    Left renal artery stenosis (HCC)    Genesis 6x12 stent 2007   Obesity (BMI 30.0-34.9) 08/26/2013   Postoperative atrial fibrillation (HCC) 10/15/2005   Presence of permanent cardiac pacemaker    S/P CABG x 4 10/13/2005   LIMA to LAD, SVG to intermediate branch, sequential SVG to PDA and RPL branch, EVH via right thigh   S/P mitral valve replacement with bioprosthetic valve 07/11/2016   31 mm [facility] Mitral bovine bioprosthetic tissue valve   S/P redo CABG x 2 07/11/2016   SVG to PDA and SVG to Intermediate Branch, EVH via left thigh   Past Surgical History:  Procedure Laterality Date   A-FLUTTER ABLATION N/A 05/11/2017   Procedure: A-Flutter Ablation;  Surgeon: Regan Lemming, MD;  Location: [facility] INVASIVE CV LAB;  Service: Cardiovascular;  Laterality: N/A;   BIV ICD GENERATOR CHANGEOUT N/A 08/29/2023   Procedure: BIV ICD GENERATOR CHANGEOUT;  Surgeon: Regan Lemming, MD;  Location: [facility] INVASIVE CV LAB;  Service: Cardiovascular;  Laterality: N/A;   CARDIAC CATHETERIZATION N/A 06/21/2016   Procedure: Right/Left Heart Cath and Coronary/Graft Angiography;  Surgeon: Tonny Bollman, MD;  Location: [facility] INVASIVE CV LAB;   Service: Cardiovascular;  Laterality: N/A;   CARDIAC VALVE REPLACEMENT     CARDIOVERSION N/A 07/19/2016   Procedure: CARDIOVERSION;  Surgeon: Lewayne Bunting, MD;  Location: [facility] ENDOSCOPY;  Service: Cardiovascular;  Laterality: N/A;   CARDIOVERSION N/A 09/08/2016   Procedure: CARDIOVERSION;  Surgeon: Pricilla Riffle, MD;  Location: [facility] ENDOSCOPY;  Service: Cardiovascular;  Laterality: N/A;   CORONARY ANGIOPLASTY     STENT 2016  [facility]   CORONARY ANGIOPLASTY WITH STENT PLACEMENT     DES in SVG to right coronary artery system   CORONARY ARTERY BYPASS GRAFT  10/13/2005   LIMA to LAD, SVG to intermediate branch, sequential SVG to PDA and RPL   CORONARY ARTERY BYPASS GRAFT N/A 07/11/2016   Procedure: REDO CORONARY ARTERY BYPASS GRAFTING (CABG) x two using left leg greater saphenous vein harvested endoscopically-SVG to PDA -SVG to RAMUS INTERMEDIATE;  Surgeon: Purcell Nails, MD;  Location: [facility] OR;  Service: Open Heart Surgery;  Laterality: N/A;   CORONARY ARTERY BYPASS GRAFT N/A 07/11/2016   Procedure: Re-exploration (CABG) for post op bleeding,;  Surgeon: Purcell Nails, MD;  Location: [facility] OR;  Service: Open Heart Surgery;  Laterality: N/A;   EP IMPLANTABLE DEVICE N/A 07/25/2016   Procedure: BiV ICD Insertion CRT-D;  Surgeon: Will Jorja Loa, MD;  Location: [facility] INVASIVE CV LAB;  Service: Cardiovascular;  Laterality: N/A;   INGUINAL HERNIA REPAIR Left 08/20/2017   INGUINAL HERNIA REPAIR Left 08/20/2017   Procedure: OPEN LEFT INGUINAL HERNIA REPAIR;  Surgeon: Ovidio Kin, MD;  Location: [facility] OR;  Service: General;  Laterality: Left;   LEFT HEART CATH AND CORS/GRAFTS ANGIOGRAPHY N/A 12/18/2016   Procedure: Left Heart Cath and Cors/Grafts Angiography;  Surgeon: Tonny Bollman, MD;  Location: [facility] INVASIVE CV LAB;  Service: Cardiovascular;  Laterality: N/A;   LEFT HEART CATH AND CORS/GRAFTS ANGIOGRAPHY N/A 08/22/2017   Procedure: LEFT HEART CATH AND CORS/GRAFTS ANGIOGRAPHY;  Surgeon: Laurey Morale, MD;  Location: [facility] INVASIVE CV LAB;  Service: Cardiovascular;  Laterality: N/A;   MITRAL VALVE REPLACEMENT N/A 07/11/2016   Procedure: MITRAL VALVE (MV) REPLACEMENT;  Surgeon: Purcell Nails, MD;  Location: [facility] OR;  Service: Open Heart Surgery;  Laterality: N/A;   MYOCARDICAL PERFUSION  10/09/2007   NORMAL PERFUSION IN ALL REGIONS;NO EVIDENCE OF INDUCIBLE ISCHEMIA;POST STRESS EF% 66   RENAL ARTERY STENT Right 2007   RENAL DOPPLER  03/28/2010   RIGHT RA-NORMAL;LEFT PROXIMAL RA AT STENT-PATENT WITH NO EVIDENCE OF SIGN DIAMETER REDUCTION. R & L KIDNEYS: EQUAL IN SIZE,SYMMETRICAL IN SHAPE.   RIGHT HEART CATH N/A 01/08/2020   Procedure: RIGHT HEART CATH;  Surgeon: Laurey Morale, MD;  Location: [facility] INVASIVE CV LAB;  Service: Cardiovascular;  Laterality: N/A;   TEE WITHOUT CARDIOVERSION N/A 06/15/2016   Procedure: TRANSESOPHAGEAL ECHOCARDIOGRAM (TEE);  Surgeon: Thurmon Fair, MD;  Location: [facility] ENDOSCOPY;  Service: Cardiovascular;  Laterality: N/A;   TEE WITHOUT CARDIOVERSION N/A 07/11/2016   Procedure: TRANSESOPHAGEAL ECHOCARDIOGRAM (TEE);  Surgeon: Purcell Nails, MD;  Location: [facility] OR;  Service: Open Heart Surgery;  Laterality: N/A;   TEE WITHOUT CARDIOVERSION N/A 07/19/2016   Procedure: TRANSESOPHAGEAL ECHOCARDIOGRAM (TEE);  Surgeon: Lewayne Bunting, MD;  Location: [facility] ENDOSCOPY;  Service: Cardiovascular;  Laterality: N/A;   TRANSESOPHAGEAL ECHOCARDIOGRAM  10/19/2005   NORMAL LV; MILD TO MODERATE AMOUNT OF SOFT ATHEROMATOUS PLAQUE OF THE THORACIC AORTA; THE LEFT ATRIUM IS MILDLY DILATED;LEFT ATRIAL APPENDAGE FUNCTION IS NORMAL;NO THROMBUS IDENTIFIED. SMALL PFO WITH RIGHT TO LEFT SHUNT   Social History   Socioeconomic History   Marital status: Married    Spouse name: Not on file   Number of children: 0   Years of education: 18   Highest education level: Not on file  Occupational History   Occupation: retired  Tobacco Use   Smoking status: Never   Smokeless tobacco: Never  Vaping Use    Vaping status: Never Used  Substance and Sexual Activity   Alcohol use: No   Drug use: No   Sexual activity: Never  Other Topics Concern   Not on file  Social History Narrative   Right handed   Two story home   Drinks half and half coffee   Social Drivers of Health   Financial Resource Strain: Not on file  Food Insecurity: Not on file  Transportation Needs: Not on file  Physical Activity: Not on file  Stress: Not on file  Social Connections: Not on file   Outpatient Encounter Medications as of 09/13/2023  Medication Sig   amoxicillin (AMOXIL) 500 MG tablet Take 4 tablets (2,000 mg total) by mouth See admin instructions. Take 2000 mg 1 hour prior to dental work   aspirin EC 81 MG tablet Take 81 mg by mouth daily.    Blood Glucose Monitoring Suppl (ACCU-CHEK GUIDE ME) w/Device KIT 1 Piece by Does not apply route as  directed.   carvedilol (COREG) 12.5 MG tablet TAKE 1 & 1/2 (ONE & ONE-HALF) TABLETS BY MOUTH TWICE DAILY WITH A MEAL   Cholecalciferol (VITAMIN D) 50 MCG (2000 UT) tablet Take 2,000 Units by mouth daily with breakfast.   clopidogrel (PLAVIX) 75 MG tablet Take 1 tablet (75 mg total) by mouth daily.   dapagliflozin propanediol (FARXIGA) 10 MG TABS tablet TAKE 1 TABLET BY MOUTH ONCE DAILY BEFORE BREAKFAST **DICOUNTINE TRADJENTA**   digoxin (LANOXIN) 0.125 MG tablet Take 1/2 (one-half) tablet by mouth once daily   eplerenone (INSPRA) 50 MG tablet Take 1 tablet (50 mg total) by mouth daily.   furosemide (LASIX) 20 MG tablet Take 1 tablet (20 mg total) by mouth daily as needed (for a weight over 190 lbs).   glipiZIDE (GLUCOTROL XL) 2.5 MG 24 hr tablet Take 1 tablet (2.5 mg total) by mouth daily with breakfast.   glucose blood (ACCU-CHEK GUIDE) test strip Use  to monitor glucose once a day at fasting.   levothyroxine (SYNTHROID) 75 MCG tablet Take 1 tablet (75 mcg total) by mouth daily before breakfast.   neomycin-bacitracin-polymyxin (NEOSPORIN) ointment Apply 1 application   topically daily as needed for wound care.   nitroGLYCERIN (NITROSTAT) 0.4 MG SL tablet DISSOLVE ONE TABLET UNDER THE TONGUE EVERY 5 MINUTES AS NEEDED FOR CHEST PAIN.  DO NOT EXCEED A TOTAL OF 3 DOSES IN 15 MINUTES   polyethylene glycol (MIRALAX) 17 g packet Take 17 g by mouth daily as needed for mild constipation or moderate constipation.   rosuvastatin (CRESTOR) 10 MG tablet Take 1 tablet (10 mg total) by mouth daily.   sacubitril-valsartan (ENTRESTO) 97-103 MG Take 1 tablet by mouth twice daily   sodium zirconium cyclosilicate (LOKELMA) 10 g PACK packet DISSOLVE 1 PACKET IN WATER & DRINK ONCE DAILY   Tetrahydrozoline HCl (VISINE OP) Place 1 drop into both eyes daily as needed (irritation).   [DISCONTINUED] glipiZIDE (GLUCOTROL XL) 2.5 MG 24 hr tablet Take 1 tablet by mouth once daily with breakfast   [DISCONTINUED] levothyroxine (SYNTHROID) 75 MCG tablet TAKE 1/2 (ONE-HALF) TABLET BY MOUTH BEFORE BREAKFAST   No facility-administered encounter medications on file as of 09/13/2023.   ALLERGIES: Allergies  Allergen Reactions   Xanax [Alprazolam] Other (See Comments)    Pt feels very weak, tired and feels paralyzed     VACCINATION STATUS: Immunization History  Administered Date(s) Administered   Fluad Quad(high Dose 65+) 07/28/2019   Influenza,inj,Quad PF,6+ Mos 08/01/2016, 07/11/2017   Pneumococcal Polysaccharide-23 07/02/2015    Diabetes He presents for his follow-up diabetic visit. He has type 2 diabetes mellitus. Onset time: He was diagnosed at approximate age of 63 years. His disease course has been improving. There are no hypoglycemic associated symptoms. Pertinent negatives for hypoglycemia include no confusion, headaches, pallor or seizures. There are no diabetic associated symptoms. Pertinent negatives for diabetes include no chest pain, no fatigue, no polydipsia, no polyphagia, no polyuria and no weakness. There are no hypoglycemic complications. Symptoms are improving. Diabetic  complications include heart disease. Risk factors for coronary artery disease include diabetes mellitus, dyslipidemia, hypertension, male sex and sedentary lifestyle. He is compliant with treatment most of the time. His weight is decreasing steadily. He participates in exercise intermittently. His home blood glucose trend is fluctuating minimally. (He did not bring any logs nor meter to review.  His point-of-care A1c 6.7% improving from 7.3%.    ) An ACE inhibitor/angiotensin II receptor blocker is being taken.  Hyperlipidemia This is a chronic problem.  The current episode started more than 1 year ago. Exacerbating diseases include diabetes. Pertinent negatives include no chest pain, myalgias or shortness of breath. Current antihyperlipidemic treatment includes statins. Risk factors for coronary artery disease include diabetes mellitus, dyslipidemia, hypertension and male sex.  Hypertension This is a chronic problem. The current episode started more than 1 year ago. Pertinent negatives include no chest pain, headaches, neck pain, palpitations or shortness of breath. Risk factors for coronary artery disease include diabetes mellitus and dyslipidemia. Hypertensive end-organ damage includes CAD/MI. Identifiable causes of hypertension include a thyroid problem.  Thyroid Problem Presents for follow-up visit. Patient reports no constipation, diarrhea, fatigue or palpitations. (Remains on levothyroxine 75 mcg p.o. nightly.  He is compliant.  He has no new complaints today.) The symptoms have been stable. His past medical history is significant for diabetes and hyperlipidemia.    Review of systems  Constitutional: + Steady body weight 200 pounds,   Body mass index is 26.12 kg/m. , no fatigue, no subjective hyperthermia, no subjective hypothermia    Objective:    BP 118/68 Comment: R arm with manual cuff  Pulse 64   Ht 6' (1.829 m)   Wt 192 lb 9.6 oz (87.4 kg)   BMI 26.12 kg/m   Wt Readings from  Last 3 Encounters:  09/13/23 192 lb 9.6 oz (87.4 kg)  08/29/23 186 lb (84.4 kg)  08/21/23 193 lb 9.6 oz (87.8 kg)     Physical Exam- Limited  Constitutional:  Body mass index is 26.12 kg/m. , not in acute distress, normal state of mind Eyes:  EOMI, no exophthalmos   Chemistry (most recent): Lab Results  Component Value Date   NA 140 08/21/2023   K 4.6 08/21/2023   CL 101 08/21/2023   CO2 23 08/21/2023   BUN 37 (H) 08/21/2023   CREATININE 1.67 (H) 08/21/2023     Lipid Panel     Component Value Date/Time   CHOL 129 08/21/2023 1306   TRIG 82 08/21/2023 1306   HDL 37 (L) 08/21/2023 1306   CHOLHDL 3.5 08/21/2023 1306   CHOLHDL 3.5 05/31/2022 1142   VLDL 16 05/31/2022 1142   LDLCALC 76 08/21/2023 1306   LABVLDL 16 08/21/2023 1306   A1c today 7.3%.   Latest Reference Range & Units 08/21/23 13:06 08/21/23 13:07 09/13/23 14:10  Glucose 70 - 99 mg/dL 97 98   WGN5A, POC (controlled diabetic range) 0.0 - 7.0 %   6.7  TSH 0.450 - 4.500 uIU/mL 2.250    T4,Free(Direct) 0.82 - 1.77 ng/dL 2.13     Assessment & Plan:   1. Type 2 diabetes mellitus with other circulatory complication (HCC), stage 3 CKD   His diabetes is  complicated by coronary artery disease status post coronary artery bypass graft and recent ACS and patient remains at a high risk for more acute and chronic complications of diabetes which include CAD, CVA, CKD, retinopathy, and neuropathy. These are all discussed in detail with the patient.  He did not bring any logs nor meter to review.  His point-of-care A1c 6.7% improving from 7.3%.    He did not document or report hypoglycemia.     His most recent CMP still consistent with renal insufficiency- does have history of stage 2-3 renal insufficiency. - Suggestion is made for him to avoid simple carbohydrates  from his diet including Cakes, Sweet Desserts, Ice Cream, Soda (diet and regular), Sweet Tea, Candies, Chips, Cookies, Store Bought Juices, Alcohol in Excess  of  1-2 drinks  a day, Artificial Sweeteners,  Coffee Creamer, and "Sugar-free" Products, Lemonade. This will help patient to have more stable blood glucose profile and potentially avoid unintended weight gain.  - Patient is advised to stick to a routine mealtimes to eat 3 meals  a day and avoid unnecessary snacks ( to snack only to correct hypoglycemia).   - I have approached patient with the following individualized plan to manage diabetes and patient agrees.  -He has not responded to the additional intervention with low-dose glipizide.  I advised him to continue glipizide 2.5 mg XL p.o. daily at breakfast associated with monitoring of blood glucose at least 1 time a day before breakfast and as needed when he is symptomatic of hypoglycemia.   He is advised to call clinic for hypoglycemia under 70 or hyperglycemia above 200 mg per DL at fasting.  He is also advised to continue Farxiga 10 mg p.o. daily at breakfast.  Side effects and precautions discussed with him.   - Patient specific target  for A1c; LDL, HDL, Triglycerides, were discussed in detail.  2) BP/HTN -His blood pressure is controlled to target.  He is currently on carvedilol 12.5 mg p.o. twice daily, eplerenone 50 mg p.o. daily, as well as Entresto 97-103 mg p.o. daily.   He could be considered for low-dose of Entresto.  3) Lipids/HPL: His most recent lipid panel showed LDL of 76.  He is advised to continue Crestor 10 mg p.o. nightly.   Side effects and precautions discussed with him.    4)  Weight/Diet: His BMI is 26.12-he is not a candidate for major weight loss.   CDE consult in progress, exercise, and carbohydrates information provided.  5) hypothyroidism: - This is related to his therapy with amiodarone. His previsit thyroid function tests are consistent with appropriate replacement.  He is advised to continue on levothyroxine 37.5 mcg p.o. daily before breakfast.      - We discussed about the correct intake of his thyroid  hormone, on empty stomach at fasting, with water, separated by at least 30 minutes from breakfast and other medications,  and separated by more than 4 hours from calcium, iron, multivitamins, acid reflux medications (PPIs). -Patient is made aware of the fact that thyroid hormone replacement is needed for life, dose to be adjusted by periodic monitoring of thyroid function tests.   6) vitamin D deficiency: He has responded to vitamin D supplement with correction of his 25-hydroxy vitamin D to 40.  He is advised to continue vitamin D3 2000 units daily.   7) Chronic Care/Health Maintenance:  -Patient is  on ACEI/ARB and Statin medications and encouraged to continue to follow up with Ophthalmology, Podiatrist at least yearly or according to recommendations, and advised to  stay away from smoking. I have recommended yearly flu vaccine and pneumonia vaccination at least every 5 years, and  sleep for at least 7 hours a day.  This patient may benefit from screening bone density.  I advised patient to maintain close follow up with his cardiologist and his  PCP for primary care needs.   I spent  26  minutes in the care of the patient today including review of labs from CMP, Lipids, Thyroid Function, Hematology (current and previous including abstractions from other facilities); face-to-face time discussing  his blood glucose readings/logs, discussing hypoglycemia and hyperglycemia episodes and symptoms, medications doses, his options of short and long term treatment based on the latest standards of care / guidelines;  discussion about incorporating lifestyle medicine;  and documenting the encounter. Risk reduction counseling performed per USPSTF guidelines to reduce  cardiovascular risk factors.     Please refer to Patient Instructions for Blood Glucose Monitoring and Insulin/Medications Dosing Guide"  in media tab for additional information. Please  also refer to " Patient Self Inventory" in the Media  tab  for reviewed elements of pertinent patient history.  Christian Bowman participated in the discussions, expressed understanding, and voiced agreement with the above plans.  All questions were answered to his satisfaction. he is encouraged to contact clinic should he have any questions or concerns prior to his return visit.  Follow up plan: Return in about 6 months (around 03/13/2024) for Fasting Labs  in AM B4 8, A1c -NV.  Marquis Lunch, MD Phone: [phone]  Fax: [phone]   This note was partially dictated with voice recognition software. Similar sounding words can be transcribed inadequately or may not  be corrected upon review.  09/13/2023, 5:16 PM

## 2023-09-13 NOTE — Progress Notes (Signed)
Wound check appointment. Steri-strips removed. Wound without redness. Moderate hematoma. Somewhat soft. Not Firm. Isolated to around device in pocket. No bruising. Incision edges approximated, wound well healed. Normal device function. Thresholds, sensing, and impedances consistent with implant measurements. Device programmed back to chronic settings d/t no new lead. Increased rate response sensitivity slightly. No mode switches or high ventricular rates noted. Patient educated about wound care. ROV in 3 months with implanting physician.  Ganji MD assessed hematoma and determined stable w/o new bleeding. No changes in medications. Continue to monitor. Educated patient to watch for s/s increased bleeding within pocket and to call device clinic if that happens. Message sent as FYI to implanting MD.

## 2023-09-14 NOTE — Addendum Note (Signed)
Addended by: Geralyn Flash D on: 09/14/2023 12:14 PM   Modules accepted: Orders, Level of Service

## 2023-09-14 NOTE — Progress Notes (Signed)
Remote ICD transmission.   

## 2023-09-15 ENCOUNTER — Other Ambulatory Visit (HOSPITAL_COMMUNITY): Payer: Self-pay | Admitting: Cardiology

## 2023-09-22 ENCOUNTER — Other Ambulatory Visit: Payer: Self-pay | Admitting: Cardiovascular Disease

## 2023-10-17 NOTE — Progress Notes (Signed)
 No ICM remote transmission received for 10/15/2023 and next ICM transmission scheduled for 10/24/2023.

## 2023-10-23 ENCOUNTER — Ambulatory Visit (INDEPENDENT_AMBULATORY_CARE_PROVIDER_SITE_OTHER): Payer: Medicare Other

## 2023-10-23 DIAGNOSIS — I442 Atrioventricular block, complete: Secondary | ICD-10-CM | POA: Diagnosis not present

## 2023-10-24 ENCOUNTER — Ambulatory Visit: Payer: Medicare Other | Attending: Cardiology

## 2023-10-24 DIAGNOSIS — I5022 Chronic systolic (congestive) heart failure: Secondary | ICD-10-CM

## 2023-10-24 DIAGNOSIS — Z95810 Presence of automatic (implantable) cardiac defibrillator: Secondary | ICD-10-CM | POA: Diagnosis not present

## 2023-10-25 LAB — CUP PACEART REMOTE DEVICE CHECK
Battery Remaining Longevity: 91 mo
Battery Voltage: 3.07 V
Brady Statistic AP VP Percent: 70.17 %
Brady Statistic AP VS Percent: 0.08 %
Brady Statistic AS VP Percent: 26.1 %
Brady Statistic AS VS Percent: 3.65 %
Brady Statistic RA Percent Paced: 70.04 %
Brady Statistic RV Percent Paced: 96.25 %
Date Time Interrogation Session: 20250123082618
HighPow Impedance: 52 Ohm
Implantable Lead Connection Status: 753985
Implantable Lead Connection Status: 753985
Implantable Lead Connection Status: 753985
Implantable Lead Implant Date: 20171024
Implantable Lead Implant Date: 20171024
Implantable Lead Implant Date: 20171024
Implantable Lead Location: 753858
Implantable Lead Location: 753859
Implantable Lead Location: 753860
Implantable Lead Model: 4598
Implantable Lead Model: 5076
Implantable Pulse Generator Implant Date: 20241127
Lead Channel Impedance Value: 1026 Ohm
Lead Channel Impedance Value: 1045 Ohm
Lead Channel Impedance Value: 285 Ohm
Lead Channel Impedance Value: 361 Ohm
Lead Channel Impedance Value: 380 Ohm
Lead Channel Impedance Value: 456 Ohm
Lead Channel Impedance Value: 532 Ohm
Lead Channel Impedance Value: 551 Ohm
Lead Channel Impedance Value: 570 Ohm
Lead Channel Impedance Value: 741 Ohm
Lead Channel Impedance Value: 931 Ohm
Lead Channel Impedance Value: 950 Ohm
Lead Channel Impedance Value: 988 Ohm
Lead Channel Pacing Threshold Amplitude: 0.625 V
Lead Channel Pacing Threshold Amplitude: 1 V
Lead Channel Pacing Threshold Pulse Width: 0.4 ms
Lead Channel Pacing Threshold Pulse Width: 1 ms
Lead Channel Sensing Intrinsic Amplitude: 1.6 mV
Lead Channel Sensing Intrinsic Amplitude: 22.4 mV
Lead Channel Setting Pacing Amplitude: 2 V
Lead Channel Setting Pacing Amplitude: 2 V
Lead Channel Setting Pacing Amplitude: 3.5 V
Lead Channel Setting Pacing Pulse Width: 0.4 ms
Lead Channel Setting Pacing Pulse Width: 1 ms
Lead Channel Setting Sensing Sensitivity: 0.3 mV
Zone Setting Status: 755011
Zone Setting Status: 755011
Zone Setting Status: 755011

## 2023-10-25 NOTE — Progress Notes (Signed)
EPIC Encounter for ICM Monitoring  Patient Name: Christian Bowman is a 78 y.o. male Date: 10/25/2023 Primary Care Physican: Assunta Found, MD Primary Cardiologist: Croitoru/McLean Electrophysiologist: Kathreen Cornfield Pacing: 96.2%            09/13/2023 Office Weight: 192 lbs   Since 13-Sep-2023 AT/AF  38 Time in AT/AF  0.3 hours/day (1.2 %)   Attempted call to wife/patient and unable to reach.  Left detailed message per DPR regarding transmission. Transmission reviewed.    Diet: Wife records sodium intake by reviewing food labels.     Optivol thoracic impedance suggesting normal fluid levels with the exception of possible fluid accumulation from 1/10-1/20.    Prescribed:  Furosemide 20 mg Take 1 tablet (20 mg total) by mouth daily as needed (for a weight over 190 lbs).   Labs: 06/07/2023 Creatinine 1.56, BUN 30, Potassium 4.6, Sodium 137, GFR 46 01/31/2023 Creatinine 1.70, BUN 39, Potassium 4.8, Sodium 139, GFR 42 12/06/2022 Creatinine 1.52, BUN 27, Potassium 4.3, Sodium 139, GFR 47  A complete set of results can be found in Results Review.   Recommendations:  Left voice mail with ICM number and encouraged to call if experiencing any fluid symptoms.   Follow-up plan: ICM clinic phone appointment on 11/26/2023.   91 day device clinic remote transmission 01/22/2024.     EP/Cardiology Office Visits:  Recall 12/04/2023 with Dr. Shirlee Latch.   12/26/2023 with Dr Royann Shivers.   12/03/2023 with Dr Elberta Fortis.   Copy of ICM check sent to Dr. Elberta Fortis.  3 month ICM trend: 10/23/2023.    12-14 Month ICM trend:     [name], RN 10/25/2023 2:12 PM

## 2023-11-02 ENCOUNTER — Other Ambulatory Visit (HOSPITAL_COMMUNITY): Payer: Self-pay | Admitting: Cardiology

## 2023-11-26 ENCOUNTER — Ambulatory Visit: Payer: Medicare Other | Attending: Cardiology

## 2023-11-26 DIAGNOSIS — I5022 Chronic systolic (congestive) heart failure: Secondary | ICD-10-CM

## 2023-11-26 DIAGNOSIS — Z95810 Presence of automatic (implantable) cardiac defibrillator: Secondary | ICD-10-CM

## 2023-11-30 NOTE — Progress Notes (Signed)
 EPIC Encounter for ICM Monitoring  Patient Name: Christian Bowman is a 78 y.o. male Date: 11/30/2023 Primary Care Physican: Assunta Found, MD Primary Cardiologist: Croitoru/McLean Electrophysiologist: Elberta Fortis Bi-V Pacing: 96.6%            09/13/2023 Office Weight: 192 lbs   Since 25-Oct-2023 AT/AF  13 Time in AT/AF  <0.1 hours/day (0.2 %)   Transmission results reviewed.    Diet: Wife records sodium intake by reviewing food labels.     Optivol thoracic impedance suggesting normal fluid levels within the last month.    Prescribed:  Furosemide 20 mg Take 1 tablet (20 mg total) by mouth daily as needed (for a weight over 190 lbs).   Labs: 08/21/2023 Creatinine 1.67, BUN 37, Potassium 4.6, Sodium 140, GFR 42 06/07/2023 Creatinine 1.56, BUN 30, Potassium 4.6, Sodium 137, GFR 46 A complete set of results can be found in Results Review.   Recommendations:  No changes.   Follow-up plan: ICM clinic phone appointment on 12/31/2023.   91 day device clinic remote transmission 01/22/2024.     EP/Cardiology Office Visits:  Recall 12/04/2023 with Dr. Shirlee Latch.   12/26/2023 with Dr Royann Shivers.   12/03/2023 with Canary Brim, NP.   Copy of ICM check sent to Dr. Elberta Fortis.  3 month ICM trend: 11/25/2023.    12-14 Month ICM trend:     [name], RN 11/30/2023 8:26 AM

## 2023-12-01 NOTE — Progress Notes (Unsigned)
  Electrophysiology Office Note:   Date:  12/03/2023  ID:  Christian Bowman, DOB June 14, 1946, MRN [id number]  Primary Cardiologist: Marca Ancona, MD Primary Heart Failure: None Electrophysiologist: Will Jorja Loa, MD       History of Present Illness:   Christian Bowman is a 78 y.o. male with h/o CHB, HFrEF s/p BiV ICD, CAD s/p CABG 2007, re-do CABG 2017 with bioprosthetic MV replacement, AFL seen today for routine electrophysiology followup.   Since last being seen in our clinic the patient reports doing well. He feels his device scar is more noticeable this go round.  Otherwise, no complaints.  He follows with HF- Dr. Shirlee Latch. His wife prepares his pills for him and keeps him on track for his diet.  They monitor his intake closely.   He denies chest pain, palpitations, dyspnea, PND, orthopnea, nausea, vomiting, dizziness, syncope, edema, weight gain, or early satiety.   Review of systems complete and found to be negative unless listed in HPI.   EP Information / Studies Reviewed:    EKG is not ordered today. EKG from 06/07/23 reviewed which showed AV dual paced 65 bpm      ICD Interrogation-  reviewed in detail today,  See PACEART report.  Device History: Medtronic BiV ICD implanted 07/25/16 for ICM / HFrEF  Generator Change 08/29/23 History of appropriate therapy: No History of AAD therapy: No  No R Waves at 40 bpm on 12/03/23   Studies:  ECHO 06/2023 > LVEF <20%, RV systolic function mod reduced, LA mildly dilated   Arrhythmia / AAD NSVT CHB   Physical Exam:   VS:  BP (!) 141/76   Pulse 88   Ht 6' (1.829 m)   Wt 194 lb (88 kg)   SpO2 98%   BMI 26.31 kg/m    Wt Readings from Last 3 Encounters:  12/03/23 194 lb (88 kg)  09/13/23 192 lb 9.6 oz (87.4 kg)  08/29/23 186 lb (84.4 kg)     GEN: Well nourished, well developed in no acute distress NECK: No JVD; No carotid bruits CARDIAC: Regular rate and rhythm, no murmurs, rubs, gallops RESPIRATORY:  Clear to auscultation without  rales, wheezing or rhonchi  ABDOMEN: Soft, non-tender, non-distended EXTREMITIES:  No edema; No deformity   ASSESSMENT AND PLAN:    Chronic Systolic Dysfunction, CHB s/p Medtronic CRT-D  NSVT  -euvolemic on exam / OptiVol stable -Stable on an appropriate medical regimen -Normal ICD function -See Pace Art report -No changes today -GDMT per primary Cardiology   Typical Atrial Flutter  S/p ablation  -no recurrence   Device Detected AF  0.7% burden, no episode longer than 3 hours  -monitor burden on device  -CHA2DS2-VASc of at least 4 > if increased burden, will need consideration for anticoagulation  Mitral Regurgitation s/p Bioprosthetic MV Replacement  -per primary Cardiology   CAD s/p CABG  -no anginal symptoms  Disposition:   Follow up with Dr. Elberta Bowman in 6 months   Signed, Christian Brim, NP-C, AGACNP-BC [facility] - Electrophysiology  12/03/2023, 6:04 PM

## 2023-12-03 ENCOUNTER — Ambulatory Visit: Payer: Medicare Other | Admitting: Cardiology

## 2023-12-03 ENCOUNTER — Encounter: Payer: Self-pay | Admitting: Pulmonary Disease

## 2023-12-03 ENCOUNTER — Ambulatory Visit: Payer: Medicare Other | Attending: Pulmonary Disease | Admitting: Pulmonary Disease

## 2023-12-03 VITALS — BP 141/76 | HR 88 | Ht 72.0 in | Wt 194.0 lb

## 2023-12-03 DIAGNOSIS — Z95810 Presence of automatic (implantable) cardiac defibrillator: Secondary | ICD-10-CM | POA: Diagnosis present

## 2023-12-03 DIAGNOSIS — I442 Atrioventricular block, complete: Secondary | ICD-10-CM | POA: Diagnosis present

## 2023-12-03 DIAGNOSIS — I5022 Chronic systolic (congestive) heart failure: Secondary | ICD-10-CM | POA: Insufficient documentation

## 2023-12-03 DIAGNOSIS — Z953 Presence of xenogenic heart valve: Secondary | ICD-10-CM | POA: Diagnosis present

## 2023-12-03 DIAGNOSIS — I483 Typical atrial flutter: Secondary | ICD-10-CM | POA: Diagnosis present

## 2023-12-03 LAB — CUP PACEART INCLINIC DEVICE CHECK
Date Time Interrogation Session: 20250303180505
Implantable Lead Connection Status: 753985
Implantable Lead Connection Status: 753985
Implantable Lead Connection Status: 753985
Implantable Lead Implant Date: 20171024
Implantable Lead Implant Date: 20171024
Implantable Lead Implant Date: 20171024
Implantable Lead Location: 753858
Implantable Lead Location: 753859
Implantable Lead Location: 753860
Implantable Lead Model: 4598
Implantable Lead Model: 5076
Implantable Pulse Generator Implant Date: 20241127

## 2023-12-03 NOTE — Patient Instructions (Signed)
 Medication Instructions:   Your physician recommends that you continue on your current medications as directed. Please refer to the Current Medication list given to you today.   *If you need a refill on your cardiac medications before your next appointment, please call your pharmacy*   Lab Work: NONE ORDERED  TODAY\    If you have labs (blood work) drawn today and your tests are completely normal, you will receive your results only by: MyChart Message (if you have MyChart) OR A paper copy in the mail If you have any lab test that is abnormal or we need to change your treatment, we will call you to review the results.   Testing/Procedures:  NONE ORDERED  TODAY      Follow-Up: At [facility], you and your health needs are our priority.  As part of our continuing mission to provide you with exceptional heart care, we have created designated Provider Care Teams.  These Care Teams include your primary Cardiologist (physician) and Advanced Practice Providers (APPs -  Physician Assistants and Nurse Practitioners) who all work together to provide you with the care you need, when you need it.  We recommend signing up for the patient portal called "MyChart".  Sign up information is provided on this After Visit Summary.  MyChart is used to connect with patients for Virtual Visits (Telemedicine).  Patients are able to view lab/test results, encounter notes, upcoming appointments, etc.  Non-urgent messages can be sent to your provider as well.   To learn more about what you can do with MyChart, go to [email or web address].    Your next appointment:    6 month(s)    Provider:    You may see Will Jorja Loa, MD or one of the following Advanced Practice Providers on your designated Care Team:   Francis Dowse, [place] Casimiro Needle "Mardelle Matte" [place], [place] Sherie Don, NP Canary Brim, NP    Other Instructions        1st Floor: - Lobby - Registration  - Pharmacy  - Lab -  Cafe  2nd Floor: - PV Lab - Diagnostic Testing (echo, CT, nuclear med)  3rd Floor: - Vacant  4th Floor: - TCTS (cardiothoracic surgery) - AFib Clinic - Structural Heart Clinic - Vascular Surgery  - Vascular Ultrasound  5th Floor: - HeartCare Cardiology (general and EP) - Clinical Pharmacy for coumadin, hypertension, lipid, weight-loss medications, and med management appointments    Valet parking services will be available as well.

## 2023-12-05 NOTE — Progress Notes (Signed)
 Remote ICD transmission.

## 2023-12-25 NOTE — Progress Notes (Signed)
 Cardiology Office Note    Date:  01/02/2024   ID:  Christian Bowman, DOB September 25, 1946, MRN [id number]  PCP:  Assunta Found, MD  Cardiologist:  Marca Ancona, MD; Christian Brooklyn, MD; Christian Fair, MD   Chief Complaint  Patient presents with   Congestive Heart Failure   ICD check     History of Present Illness:  Christian Bowman is a 78 y.o. male with extensive and severe cardiac problems.  He has severe cardiomyopathy with LVEF of 20-25% (<20% by echo Aug 2023) due to both ischemic cardiomyopathy and valvular heart disease.  He initially had a CABG in 2007, redo CABG in October 2017 when he also received a mitral valve bioprosthesis (Edwards Magna 31 mm, implanted 2017).  He has a dual-chamber biventricular defibrillator  also implanted in 2017, generator change in 2024 ([place], Medtronic Cobalt).  He has a history of atrial flutter with successful ablation and has been off anticoagulation. In January 2021 he had transient right hand weakness and dysarthria and received thrombolytics at the emergency room in [place].  His device did not show atrial arrhythmia and echocardiogram with contrast did not show LV thrombus.  He was discharged on clopidogrel.  He was last hospitalized for heart failure exacerbation in [place] on April 17, 2022.   He is followed by Dr. Shirlee Latch in the HF clinic and has monthly Optivol downloads with Randon Goldsmith. CPX (3/21) showed severe functional impairment due to HF. He decided against LVAD placement.  He underwent replacement of his CRT-D generator in November 2024 (Dr. Elberta Fortis) his new device is a Medtronic cobalt XT HF Quad.  Overall feels well and continues to exercise at the gym following his usual routine.  He has not had problems with orthopnea or PND.  He rarely needs to take extra diuretics (a bottle of 30 furosemide tablets has lasted him for a year).  He denies chest pain, palpitations, defibrillator discharges or syncope (occasional orthostatic dizziness does  occur) and has not had any new neurological deficits.  He denies falls or bleeding problems.  Device interrogation shows normal function of his ICD.  He has a new device with an estimated generator longevity of 7.4 years.  Presenting rhythm was atrial sensed/atrial paced with biventricular pacing.  All lead parameters are within nominal range and stable from previous values.  He has 60% atrial pacing and 97.7% biventricular pacing.  The burden of atrial fibrillation has been only 0.2% which is primarily made up of a single events lasting 25 minutes that occurred on 12/14/2023.  The ventricular rate during that spell was only 70 bpm.  His OptiVol has only recently become useful following his generator change out but has been stable.  He is on maximum tolerated doses of heart failure medications, limited by his blood pressure.  He is on Farxiga, eplerenone (had gynecomastia on spironolactone), maximum dose of Entresto and a relatively high dose of carvedilol.  He is also on digoxin.  He is taking aspirin and clopidogrel for prevention of TIA/stroke  Metabolic control is Bowman with the most recent LDL of 76.  His HDL is chronically low at 37 and he has normal triglycerides and well controlled diabetes (A1c 6.7%).  He is euthyroid on levothyroxine supplements with a TSH that was recently 2.250.  His right submandibular mass continues to slowly increase in size.  It does not really bother him except for cosmetic reasons, but it did become an issue when a barostim device was considered (  that was later abandoned).  CT angiogram of the chest July 2023 showed a 17 mm paramediastinal mass. The follow up CT 07/06/2022 shows it has enlarged to 24x12 mm. The next CT 11/01/2022 showed stable findings and favored "dystrophic calcification and/or pledget or hemostatic material".    He underwent redo coronary artery bypass 2 (SVG to PDA, SVG to ramus), as well as mitral valve replacement (bioprosthetic Edwards magna 31  mm) on 07/11/2016, complicated by AV block and atrial flutter requiring cardioversion. Estimated EF 20-25 % on his 07/18/16 TEE. A biventricular defibrillator (Medtronic Claria MRI CRT-D) was implanted on July 25, 2016. On December 18, 2016 he was hospitalized for small non-STEMI and was found to have interval occlusion of the old SVG to PDA, while the more recently placed SVG was still patent. LVEDP was quite low at only 3 mmHg during that catheterization. He had atrial flutter ablation in August 2018 and is off anticoagulation. He had a NSTEMI in November 2018 after inguinal hernia repair surgery.  He is seeing Dr. Shirlee Latch in the heart failure clinic and Dr. Elberta Fortis has recently changed out his BiV ICD.  His endocrinologist is Dr. Fransico Him.    Past Medical History:  Diagnosis Date   AICD (automatic cardioverter/defibrillator) present    medtronic-   DR. Larz Mark , DR. Shirlee Latch    Anginal pain (HCC)    cp sat 08/11/17   Anxiety    CAD (coronary artery disease)    CHF (congestive heart failure) (HCC)    Complication of anesthesia    took awhile to wake up    Coronary artery disease involving coronary bypass graft    Cyst of neck    right side   DM2 (diabetes mellitus, type 2) (HCC) 08/26/2013   Dyspnea    Heart attack (HCC)    "not sure when" (08/20/2017)   HTN (hypertension) 08/26/2013   Hyperlipidemia 08/26/2013   Hypothyroidism    Left main coronary artery disease    Left renal artery stenosis (HCC)    Genesis 6x12 stent 2007   Obesity (BMI 30.0-34.9) 08/26/2013   Postoperative atrial fibrillation (HCC) 10/15/2005   Presence of permanent cardiac pacemaker    S/P CABG x 4 10/13/2005   LIMA to LAD, SVG to intermediate branch, sequential SVG to PDA and RPL branch, EVH via right thigh   S/P mitral valve replacement with bioprosthetic valve 07/11/2016   31 mm [facility] Mitral bovine bioprosthetic tissue valve   S/P redo CABG x 2 07/11/2016   SVG to PDA and SVG to Intermediate Branch,  EVH via left thigh    Past Surgical History:  Procedure Laterality Date   A-FLUTTER ABLATION N/A 05/11/2017   Procedure: A-Flutter Ablation;  Surgeon: Regan Lemming, MD;  Location: [facility] INVASIVE CV LAB;  Service: Cardiovascular;  Laterality: N/A;   BIV ICD GENERATOR CHANGEOUT N/A 08/29/2023   Procedure: BIV ICD GENERATOR CHANGEOUT;  Surgeon: Regan Lemming, MD;  Location: [facility]VAS[facility] CV LAB;  Service: Cardiovascular;  Laterality: N/A;   CARDIAC CATHETERIZATION N/A 06/21/2016   Procedure: Right/Left Heart Cath and Coronary/Graft Angiography;  Surgeon: Tonny Bollman, MD;  Location: [facility] INVASIVE CV LAB;  Service: Cardiovascular;  Laterality: N/A;   CARDIAC VALVE REPLACEMENT     CARDIOVERSION N/A 07/19/2016   Procedure: CARDIOVERSION;  Surgeon: Lewayne Bunting, MD;  Location: [facility] ENDOSCOPY;  Service: Cardiovascular;  Laterality: N/A;   CARDIOVERSION N/A 09/08/2016   Procedure: CARDIOVERSION;  Surgeon: Pricilla Riffle, MD;  Location: [facility] ENDOSCOPY;  Service:  Cardiovascular;  Laterality: N/A;   CORONARY ANGIOPLASTY     STENT 2016  [facility]   CORONARY ANGIOPLASTY WITH STENT PLACEMENT     DES in SVG to right coronary artery system   CORONARY ARTERY BYPASS GRAFT  10/13/2005   LIMA to LAD, SVG to intermediate branch, sequential SVG to PDA and RPL   CORONARY ARTERY BYPASS GRAFT N/A 07/11/2016   Procedure: REDO CORONARY ARTERY BYPASS GRAFTING (CABG) x two using left leg greater saphenous vein harvested endoscopically-SVG to PDA -SVG to RAMUS INTERMEDIATE;  Surgeon: Purcell Nails, MD;  Location: [facility] OR;  Service: Open Heart Surgery;  Laterality: N/A;   CORONARY ARTERY BYPASS GRAFT N/A 07/11/2016   Procedure: Re-exploration (CABG) for post op bleeding,;  Surgeon: Purcell Nails, MD;  Location: [facility] OR;  Service: Open Heart Surgery;  Laterality: N/A;   EP IMPLANTABLE DEVICE N/A 07/25/2016   Procedure: BiV ICD Insertion CRT-D;  Surgeon: Will Jorja Loa, MD;  Location: [facility] INVASIVE CV LAB;   Service: Cardiovascular;  Laterality: N/A;   INGUINAL HERNIA REPAIR Left 08/20/2017   INGUINAL HERNIA REPAIR Left 08/20/2017   Procedure: OPEN LEFT INGUINAL HERNIA REPAIR;  Surgeon: Ovidio Kin, MD;  Location: [name] OR;  Service: General;  Laterality: Left;   LEFT HEART CATH AND CORS/GRAFTS ANGIOGRAPHY N/A 12/18/2016   Procedure: Left Heart Cath and Cors/Grafts Angiography;  Surgeon: Tonny Bollman, MD;  Location: [facility] INVASIVE CV LAB;  Service: Cardiovascular;  Laterality: N/A;   LEFT HEART CATH AND CORS/GRAFTS ANGIOGRAPHY N/A 08/22/2017   Procedure: LEFT HEART CATH AND CORS/GRAFTS ANGIOGRAPHY;  Surgeon: Laurey Morale, MD;  Location: [facility] INVASIVE CV LAB;  Service: Cardiovascular;  Laterality: N/A;   MITRAL VALVE REPLACEMENT N/A 07/11/2016   Procedure: MITRAL VALVE (MV) REPLACEMENT;  Surgeon: Purcell Nails, MD;  Location: [facility] OR;  Service: Open Heart Surgery;  Laterality: N/A;   MYOCARDICAL PERFUSION  10/09/2007   NORMAL PERFUSION IN ALL REGIONS;NO EVIDENCE OF INDUCIBLE ISCHEMIA;POST STRESS EF% 66   RENAL ARTERY STENT Right 2007   RENAL DOPPLER  03/28/2010   RIGHT RA-NORMAL;LEFT PROXIMAL RA AT STENT-PATENT WITH NO EVIDENCE OF SIGN DIAMETER REDUCTION. R & L KIDNEYS: EQUAL IN SIZE,SYMMETRICAL IN SHAPE.   RIGHT HEART CATH N/A 01/08/2020   Procedure: RIGHT HEART CATH;  Surgeon: Laurey Morale, MD;  Location: [facility] INVASIVE CV LAB;  Service: Cardiovascular;  Laterality: N/A;   TEE WITHOUT CARDIOVERSION N/A 06/15/2016   Procedure: TRANSESOPHAGEAL ECHOCARDIOGRAM (TEE);  Surgeon: Christian Fair, MD;  Location: [facility] ENDOSCOPY;  Service: Cardiovascular;  Laterality: N/A;   TEE WITHOUT CARDIOVERSION N/A 07/11/2016   Procedure: TRANSESOPHAGEAL ECHOCARDIOGRAM (TEE);  Surgeon: Purcell Nails, MD;  Location: [facility] OR;  Service: Open Heart Surgery;  Laterality: N/A;   TEE WITHOUT CARDIOVERSION N/A 07/19/2016   Procedure: TRANSESOPHAGEAL ECHOCARDIOGRAM (TEE);  Surgeon: Lewayne Bunting, MD;  Location: [facility] ENDOSCOPY;   Service: Cardiovascular;  Laterality: N/A;   TRANSESOPHAGEAL ECHOCARDIOGRAM  10/19/2005   NORMAL LV; MILD TO MODERATE AMOUNT OF SOFT ATHEROMATOUS PLAQUE OF THE THORACIC AORTA; THE LEFT ATRIUM IS MILDLY DILATED;LEFT ATRIAL APPENDAGE FUNCTION IS NORMAL;NO THROMBUS IDENTIFIED. SMALL PFO WITH RIGHT TO LEFT SHUNT    Current Medications: Outpatient Medications Prior to Visit  Medication Sig Dispense Refill   aspirin EC 81 MG tablet Take 81 mg by mouth daily.      Blood Glucose Monitoring Suppl (ACCU-CHEK GUIDE ME) w/Device KIT 1 Piece by Does not apply route as directed. 1 kit 0   carvedilol (COREG) 12.5 MG tablet  TAKE 1 & 1/2 (ONE & ONE-HALF) TABLETS BY MOUTH TWICE DAILY WITH A MEAL 180 tablet 3   Cholecalciferol (VITAMIN D) 50 MCG (2000 UT) tablet Take 2,000 Units by mouth daily with breakfast.     eplerenone (INSPRA) 50 MG tablet Take 1 tablet (50 mg total) by mouth daily. 90 tablet 3   glipiZIDE (GLUCOTROL XL) 2.5 MG 24 hr tablet Take 1 tablet (2.5 mg total) by mouth daily with breakfast. 90 tablet 1   glucose blood (ACCU-CHEK GUIDE) test strip Use  to monitor glucose once a day at fasting. 100 each 2   levothyroxine (SYNTHROID) 75 MCG tablet Take 1 tablet (75 mcg total) by mouth daily before breakfast. 45 tablet 1   neomycin-bacitracin-polymyxin (NEOSPORIN) ointment Apply 1 application  topically daily as needed for wound care.     polyethylene glycol (MIRALAX) 17 g packet Take 17 g by mouth daily as needed for mild constipation or moderate constipation.     rosuvastatin (CRESTOR) 10 MG tablet Take 1 tablet (10 mg total) by mouth daily. 90 tablet 3   sacubitril-valsartan (ENTRESTO) 97-103 MG Take 1 tablet by mouth twice daily 180 tablet 3   sodium zirconium cyclosilicate (LOKELMA) 10 g PACK packet DISSOLVE 1 PACKET IN WATER & DRINK ONCE DAILY 90 each 3   Tetrahydrozoline HCl (VISINE OP) Place 1 drop into both eyes daily as needed (irritation).     clopidogrel (PLAVIX) 75 MG tablet Take 1 tablet  (75 mg total) by mouth daily. 90 tablet 3   dapagliflozin propanediol (FARXIGA) 10 MG TABS tablet TAKE 1 TABLET BY MOUTH ONCE DAILY BEFORE BREAKFAST **DICOUNTINE TRADJENTA** 90 tablet 3   digoxin (LANOXIN) 0.125 MG tablet Take 1/2 (one-half) tablet by mouth once daily 45 tablet 0   furosemide (LASIX) 20 MG tablet Take 1 tablet (20 mg total) by mouth daily as needed (for a weight over 190 lbs). 30 tablet 3   amoxicillin (AMOXIL) 500 MG tablet Take 4 tablets (2,000 mg total) by mouth See admin instructions. Take 2000 mg 1 hour prior to dental work (Patient not taking: Reported on 12/26/2023) 4 tablet 0   nitroGLYCERIN (NITROSTAT) 0.4 MG SL tablet DISSOLVE 1 TABLET UNDER THE TONGUE EVERY 5 MINUTES AS NEEDED FOR CHEST PAIN, DO NOT EXCEED 3 DOSES IN 15 MINUTES (Patient not taking: Reported on 12/26/2023) 25 tablet 0   No facility-administered medications prior to visit.     Allergies:   Xanax [alprazolam]   Family History:  The patient's family history includes Heart attack in his father and mother; Heart disease in his father, maternal grandmother, and mother; Hypertension in his father and sister.   ROS:   Please see the history of present illness.    ROS All other systems are reviewed and are negative.   PHYSICAL EXAM:   VS:  BP 106/72 (BP Location: Left Arm, Patient Position: Sitting, Cuff Size: Normal)   Pulse 68   Ht 6' (1.829 m)   Wt 198 lb 3.2 oz (89.9 kg)   SpO2 96%   BMI 26.88 kg/m      General: Alert, oriented x3, no distress, healthy ICD site Head: no evidence of trauma, PERRL, EOMI, no exophtalmos or lid lag, no myxedema, no xanthelasma; normal ears, nose and oropharynx Neck: normal jugular venous pulsations and no hepatojugular reflux; brisk carotid pulses without delay and no carotid bruits Chest: clear to auscultation, no signs of consolidation by percussion or palpation, normal fremitus, symmetrical and full respiratory excursions Cardiovascular: normal position and  quality  of the apical impulse, regular rhythm, normal first and second heart sounds, no murmurs, rubs or gallops Abdomen: no tenderness or distention, no masses by palpation, no abnormal pulsatility or arterial bruits, normal bowel sounds, no hepatosplenomegaly Extremities: no clubbing, cyanosis or edema; 2+ radial, ulnar and brachial pulses bilaterally; 2+ right femoral, posterior tibial and dorsalis pedis pulses; 2+ left femoral, posterior tibial and dorsalis pedis pulses; no subclavian or femoral bruits Neurological: grossly nonfocal Psych: Normal mood and affect     Wt Readings from Last 3 Encounters:  12/26/23 198 lb 3.2 oz (89.9 kg)  12/03/23 194 lb (88 kg)  09/13/23 192 lb 9.6 oz (87.4 kg)      Studies/Labs Reviewed:   ECHO 06/07/2023   1. Left ventricular ejection fraction, by estimation, is <20%. The left  ventricle has severely decreased function. The left ventricle demonstrates  global hypokinesis. The left ventricular internal cavity size was severely  dilated. Left ventricular  diastolic parameters are indeterminate. No LV thrombus noted.   2. Right ventricular systolic function is moderately reduced. The right  ventricular size is normal. There is normal pulmonary artery systolic  pressure. The estimated right ventricular systolic pressure is 26.1 mmHg.   3. Left atrial size was mildly dilated.   4. Bioprosthetic mitral valve. Mean gradient 4 mmHg, not significantly  elevated. No significant mitral regurgitation.   5. The aortic valve is tricuspid. There is mild calcification of the  aortic valve. Aortic valve regurgitation is not visualized. No aortic  stenosis is present.   6. The inferior vena cava is dilated in size with >50% respiratory  variability, suggesting right atrial pressure of 8 mmHg.      EKG: 12/06/2022 shows AV pacing with atrial bigeminy and consistent BiV pacing.  EKG Interpretation Date/Time:  Wednesday December 26 2023 10:25:00 EDT Ventricular Rate:   68 PR Interval:    QRS Duration:  180 QT Interval:  446 QTC Calculation: 474 R Axis:   262  Text Interpretation: AV dual-paced rhythm Biventricular pacemaker detected When compared with ECG of 07-Jun-2023 15:12, Vent. rate has increased BY   3 BPM Confirmed by Alora Gorey [phone]) on 01/01/2024 8:19:43 AM        Recent Labs: 08/21/2023: ALT 34; BUN 37; Creatinine, Ser 1.67; Hemoglobin 16.0; Platelets 132; Potassium 4.6; Sodium 140; TSH 2.250   Lipid Panel    Component Value Date/Time   CHOL 129 08/21/2023 1306   TRIG 82 08/21/2023 1306   HDL 37 (L) 08/21/2023 1306   CHOLHDL 3.5 08/21/2023 1306   CHOLHDL 3.5 05/31/2022 1142   VLDL 16 05/31/2022 1142   LDLCALC 76 08/21/2023 1306    ASSESSMENT:    1. Chronic combined systolic and diastolic heart failure (HCC)   2. Coronary artery disease involving native coronary artery of native heart without angina pectoris   3. History of atrial flutter   4. Status post mitral valve replacement with bioprosthetic valve   5. Presence of biventricular implantable cardioverter-defibrillator (ICD)   6. PVCs (premature ventricular contractions)   7. Essential hypertension   8. Mixed hyperlipidemia   9. Type 2 diabetes mellitus with stage 3b chronic kidney disease, without long-term current use of insulin (HCC)   10. Left renal artery stenosis (HCC)   11. Submandibular gland mass        PLAN:  In order of problems listed above:  CHF: Appears euvolemic clinically and based on OptiVol and maintains NYHA functional class I-2, dry weight around 190-195 pounds (on  his home scale he is 5 pounds less).   He is on maximum tolerated doses of carvedilol, Entresto and eplerenone and dapagliflozin.  Also on digoxin.  Requires Lokelma to keep his potassium in normal range.  On most recent echo LVEF is less than 20%.  Has been offered LVAD therapy but has declined. CAD: Had redo bypass.  He does not have angina pectoris. AFlutter: He has not had any  recurrence since his ablation procedure and is no longer taking anticoagulants or antiarrhythmics.  Monitoring via his BiV ICD. S/P MVR: Normal function of his bioprosthesis on echo performed 06/07/2023 with a stable mean gradient of 4 mmHg and no regurgitation. CRT-D: Recent generator change out by Dr. Elberta Fortis.  Normal device function.  BiV pacing has improved at 97.7%, mostly limited by PVCs.   PVCs: These seem to have decreased in frequency.  He did not tolerate higher doses of carvedilol due to hypotension. Hx HTN: Tends to run in low normal range. HLP: LDL only slightly higher than target range and HDL is chronically low.  Continue rosuvastatin. DM: Improved glycemic control, A1c in target.  Sees Dr. Fransico Him. CKD 3b: Stable creatinine 1.67. Baseline creatinine around 1.5, GFR roughly 45.  Requires Lokelma to prevent hyperkalemia while taking Entresto and eplerenone. History of left renal artery stenosis status post stent 2007.  Stable renal function and well-controlled hypertension. Submandibular mass: Reluctant to undergo general anesthesia which would be necessary for resection of this mass.  Also aware of the risk for cranial nerve palsy.  Precluded implantation of a Barostim device. Left upper lobe paramediastinal mass: probably dystrophic calcification/suture material from previous surgery.    Medication Adjustments/Labs and Tests Ordered: Current medicines are reviewed at length with the patient today.  Concerns regarding medicines are outlined above.  Medication changes, Labs and Tests ordered today are listed in the Patient Instructions below. Patient Instructions  Medication Instructions:  Refills sent in *If you need a refill on your cardiac medications before your next appointment, please call your pharmacy*  Follow-Up: At [facility], you and your health needs are our priority.  As part of our continuing mission to provide you with exceptional heart care, we have created  designated Provider Care Teams.  These Care Teams include your primary Cardiologist (physician) and Advanced Practice Providers (APPs -  Physician Assistants and Nurse Practitioners) who all work together to provide you with the care you need, when you need it.  We recommend signing up for the patient portal called "MyChart".  Sign up information is provided on this After Visit Summary.  MyChart is used to connect with patients for Virtual Visits (Telemedicine).  Patients are able to view lab/test results, encounter notes, upcoming appointments, etc.  Non-urgent messages can be sent to your provider as well.   To learn more about what you can do with MyChart, go to [email or web address].    Your next appointment:   5 month(s)  Provider:   Dr Royann Shivers           Signed, Christian Fair, MD  01/02/2024 4:38 PM    [facility] [address] [place], [place], [place]  [address] Phone: [phone]; Fax: [phone]

## 2023-12-26 ENCOUNTER — Encounter: Payer: Self-pay | Admitting: Cardiovascular Disease

## 2023-12-26 ENCOUNTER — Ambulatory Visit: Payer: Medicare Other | Attending: Cardiovascular Disease | Admitting: Cardiovascular Disease

## 2023-12-26 VITALS — BP 106/72 | HR 68 | Ht 72.0 in | Wt 198.2 lb

## 2023-12-26 DIAGNOSIS — K118 Other diseases of salivary glands: Secondary | ICD-10-CM

## 2023-12-26 DIAGNOSIS — I2510 Atherosclerotic heart disease of native coronary artery without angina pectoris: Secondary | ICD-10-CM | POA: Diagnosis present

## 2023-12-26 DIAGNOSIS — N1832 Chronic kidney disease, stage 3b: Secondary | ICD-10-CM

## 2023-12-26 DIAGNOSIS — I5042 Chronic combined systolic (congestive) and diastolic (congestive) heart failure: Secondary | ICD-10-CM

## 2023-12-26 DIAGNOSIS — E1122 Type 2 diabetes mellitus with diabetic chronic kidney disease: Secondary | ICD-10-CM | POA: Diagnosis present

## 2023-12-26 DIAGNOSIS — Z95810 Presence of automatic (implantable) cardiac defibrillator: Secondary | ICD-10-CM

## 2023-12-26 DIAGNOSIS — E782 Mixed hyperlipidemia: Secondary | ICD-10-CM

## 2023-12-26 DIAGNOSIS — I10 Essential (primary) hypertension: Secondary | ICD-10-CM

## 2023-12-26 DIAGNOSIS — Z8679 Personal history of other diseases of the circulatory system: Secondary | ICD-10-CM | POA: Diagnosis present

## 2023-12-26 DIAGNOSIS — I701 Atherosclerosis of renal artery: Secondary | ICD-10-CM

## 2023-12-26 DIAGNOSIS — I493 Ventricular premature depolarization: Secondary | ICD-10-CM | POA: Diagnosis present

## 2023-12-26 DIAGNOSIS — Z953 Presence of xenogenic heart valve: Secondary | ICD-10-CM

## 2023-12-26 MED ORDER — DAPAGLIFLOZIN PROPANEDIOL 10 MG PO TABS: 10.0000 mg | ORAL_TABLET | Freq: Every day | ORAL | 3 refills | Status: AC

## 2023-12-26 MED ORDER — CLOPIDOGREL BISULFATE 75 MG PO TABS: 75.0000 mg | ORAL_TABLET | Freq: Every day | ORAL | 3 refills | Status: DC

## 2023-12-26 MED ORDER — NITROGLYCERIN 0.4 MG SL SUBL: SUBLINGUAL_TABLET | SUBLINGUAL | 1 refills | Status: AC

## 2023-12-26 MED ORDER — FUROSEMIDE 20 MG PO TABS: 20.0000 mg | ORAL_TABLET | Freq: Every day | ORAL | 11 refills | Status: DC | PRN

## 2023-12-26 MED ORDER — DIGOXIN 125 MCG PO TABS: ORAL_TABLET | ORAL | 3 refills | Status: DC

## 2023-12-26 MED ORDER — AMOXICILLIN 500 MG PO TABS: 2000.0000 mg | ORAL_TABLET | Freq: Once | ORAL | 4 refills | Status: DC | PRN

## 2023-12-26 NOTE — Patient Instructions (Signed)
 Medication Instructions:  Refills sent in *If you need a refill on your cardiac medications before your next appointment, please call your pharmacy*  Follow-Up: At [facility], you and your health needs are our priority.  As part of our continuing mission to provide you with exceptional heart care, we have created designated Provider Care Teams.  These Care Teams include your primary Cardiologist (physician) and Advanced Practice Providers (APPs -  Physician Assistants and Nurse Practitioners) who all work together to provide you with the care you need, when you need it.  We recommend signing up for the patient portal called "MyChart".  Sign up information is provided on this After Visit Summary.  MyChart is used to connect with patients for Virtual Visits (Telemedicine).  Patients are able to view lab/test results, encounter notes, upcoming appointments, etc.  Non-urgent messages can be sent to your provider as well.   To learn more about what you can do with MyChart, go to [email or web address].    Your next appointment:   5 month(s)  Provider:   Dr Royann Shivers

## 2023-12-31 ENCOUNTER — Ambulatory Visit: Payer: Medicare Other | Attending: Cardiology

## 2023-12-31 DIAGNOSIS — Z95810 Presence of automatic (implantable) cardiac defibrillator: Secondary | ICD-10-CM

## 2023-12-31 DIAGNOSIS — I5042 Chronic combined systolic (congestive) and diastolic (congestive) heart failure: Secondary | ICD-10-CM | POA: Diagnosis not present

## 2024-01-01 NOTE — Progress Notes (Signed)
 EPIC Encounter for ICM Monitoring  Patient Name: Christian Bowman is a 78 y.o. male Date: 01/01/2024 Primary Care Physican: Assunta Found, MD Primary Cardiologist: [facility] Electrophysiologist: Elberta Fortis Bi-V Pacing: 97.5%            09/13/2023 Office Weight: 192 lbs 01/01/2024 Weight: 189 lbs   Since 26-Dec-2023 Time in AT/AF  0.0 hours/day (0.0 %)   Spoke with patient and heart failure questions reviewed.  Transmission results reviewed.  Pt weight was >190 lbs on 3/31 but is back to baseline today.  Reports feeling well at this time and voices no complaints.     Diet: Wife records sodium intake by reviewing food labels.     Optivol thoracic impedance suggesting possible fluid accumulation starting 3/19 and trending back close to baseline.    Prescribed:  Furosemide 20 mg Take 1 tablet (20 mg total) by mouth daily as needed (for a weight over 190 lbs).   Labs: 08/21/2023 Creatinine 1.67, BUN 37, Potassium 4.6, Sodium 140, GFR 42 06/07/2023 Creatinine 1.56, BUN 30, Potassium 4.6, Sodium 137, GFR 46 A complete set of results can be found in Results Review.   Recommendations: He will take 1 Furosemide today for possible fluid accumulation and encouraged to call if experiencing any fluid symptoms.   Follow-up plan: ICM clinic phone appointment on 02/04/2024.   91 day device clinic remote transmission 01/22/2024.     EP/Cardiology Office Visits:  02/20/2024 with Dr. Shirlee Latch.   Next appt due 05/27/2024 with Dr Royann Shivers (no recall).   05/31/2024 with Dr Lalla Brothers or Canary Brim, NP.   Copy of ICM check sent to Dr. Elberta Fortis.  3 month ICM trend: 12/30/2023.    12-14 Month ICM trend:     [name], RN 01/01/2024 12:13 PM

## 2024-01-16 ENCOUNTER — Other Ambulatory Visit (HOSPITAL_COMMUNITY): Payer: Self-pay | Admitting: Cardiology

## 2024-01-18 ENCOUNTER — Telehealth: Payer: Self-pay | Admitting: *Deleted

## 2024-01-18 NOTE — Telephone Encounter (Signed)
 Alert received from [employer]:  Alert remote transmission: AF. 2 AT/AF episodes, longest 6 hrs 38 min on 01/17/24, EGMs c/w AF, V rates are controlled, burden 1.7% since 12/30/23, not on OAC per Epic (on ASA ad Plavix ), AT/AF Daily Burden > Threshold CareAlert reset in [facility]; routed to Triage  _____________________________________________________________________________________ Patient back in sinus rhythm on presenting. Routing to Dr. Francyne for review and possible recommendations.

## 2024-01-19 ENCOUNTER — Encounter: Payer: Self-pay | Admitting: Cardiovascular Disease

## 2024-01-19 NOTE — Telephone Encounter (Signed)
 Pacemaker download shows he has had episodes of paroxysmal atrial fibrillation lasting several hours since last office visit. Would like to stop the ASA and start Eliquis  5 mg twice daily please. Continue clopidogrel .

## 2024-01-22 ENCOUNTER — Ambulatory Visit (INDEPENDENT_AMBULATORY_CARE_PROVIDER_SITE_OTHER): Payer: Medicare Other

## 2024-01-22 DIAGNOSIS — I442 Atrioventricular block, complete: Secondary | ICD-10-CM

## 2024-01-22 LAB — CUP PACEART REMOTE DEVICE CHECK
Battery Remaining Longevity: 94 mo
Battery Voltage: 3.01 V
Brady Statistic RV Percent Paced: 97.54 %
Date Time Interrogation Session: 20250421192204
HighPow Impedance: 56 Ohm
Implantable Lead Connection Status: 753985
Implantable Lead Connection Status: 753985
Implantable Lead Connection Status: 753985
Implantable Lead Implant Date: 20171024
Implantable Lead Implant Date: 20171024
Implantable Lead Implant Date: 20171024
Implantable Lead Location: 753858
Implantable Lead Location: 753859
Implantable Lead Location: 753860
Implantable Lead Model: 4598
Implantable Lead Model: 5076
Implantable Pulse Generator Implant Date: 20241127
Lead Channel Impedance Value: 1026 Ohm
Lead Channel Impedance Value: 1045 Ohm
Lead Channel Impedance Value: 304 Ohm
Lead Channel Impedance Value: 399 Ohm
Lead Channel Impedance Value: 399 Ohm
Lead Channel Impedance Value: 456 Ohm
Lead Channel Impedance Value: 532 Ohm
Lead Channel Impedance Value: 551 Ohm
Lead Channel Impedance Value: 570 Ohm
Lead Channel Impedance Value: 741 Ohm
Lead Channel Impedance Value: 931 Ohm
Lead Channel Impedance Value: 931 Ohm
Lead Channel Impedance Value: 950 Ohm
Lead Channel Pacing Threshold Amplitude: 0.625 V
Lead Channel Pacing Threshold Amplitude: 1.125 V
Lead Channel Pacing Threshold Pulse Width: 0.4 ms
Lead Channel Pacing Threshold Pulse Width: 1 ms
Lead Channel Sensing Intrinsic Amplitude: 2.4 mV
Lead Channel Sensing Intrinsic Amplitude: 25.3 mV
Lead Channel Setting Pacing Amplitude: 2 V
Lead Channel Setting Pacing Amplitude: 2 V
Lead Channel Setting Pacing Amplitude: 2.25 V
Lead Channel Setting Pacing Pulse Width: 0.4 ms
Lead Channel Setting Pacing Pulse Width: 1 ms
Lead Channel Setting Sensing Sensitivity: 0.3 mV
Zone Setting Status: 755011
Zone Setting Status: 755011
Zone Setting Status: 755011

## 2024-01-23 MED ORDER — APIXABAN 5 MG PO TABS: 5.0000 mg | ORAL_TABLET | Freq: Two times a day (BID) | ORAL | 6 refills | Status: AC

## 2024-01-23 NOTE — Telephone Encounter (Signed)
 Called patient and spoke with spouse to update on medication changes and educate on Eliquis  and its possible side effects and risks. Patient's spouse said that the patient was unable to come to the phone at that time so she acknowledged the medication education provided by this RN and said she would relay to patient after call. New meds ordered and sent to patient's preferred pharmacy listed in chart. All questions were answered. MD updated.

## 2024-02-04 ENCOUNTER — Ambulatory Visit: Attending: Cardiology

## 2024-02-04 DIAGNOSIS — Z95810 Presence of automatic (implantable) cardiac defibrillator: Secondary | ICD-10-CM | POA: Diagnosis not present

## 2024-02-04 DIAGNOSIS — I5042 Chronic combined systolic (congestive) and diastolic (congestive) heart failure: Secondary | ICD-10-CM | POA: Diagnosis not present

## 2024-02-07 ENCOUNTER — Telehealth: Payer: Self-pay

## 2024-02-07 NOTE — Telephone Encounter (Signed)
Remote ICM transmission received.  Attempted call to wife/patient regarding ICM remote transmission and no answer.

## 2024-02-07 NOTE — Progress Notes (Signed)
 EPIC Encounter for ICM Monitoring  Patient Name: Christian Bowman is a 78 y.o. male Date: 02/07/2024 Primary Care Physican: Minus Amel, MD Primary Cardiologist: [facility] Electrophysiologist: Lawana Pray Bi-V Pacing: 95.9%            09/13/2023 Office Weight: 192 lbs 01/01/2024 Weight: 189 lbs   Since 21-Jan-2024 Time in AT/AF  <0.1 hours/day (0.2 %)   Attempted call to wife/patient and unable to reach.    Transmission results reviewed.     Diet: Wife records sodium intake by reviewing food labels.     Optivol thoracic impedance suggesting normal fluid levels since 4/13.    Prescribed:  Furosemide  20 mg Take 1 tablet (20 mg total) by mouth daily as needed (for a weight over 190 lbs).   Labs: 08/21/2023 Creatinine 1.67, BUN 37, Potassium 4.6, Sodium 140, GFR 42 06/07/2023 Creatinine 1.56, BUN 30, Potassium 4.6, Sodium 137, GFR 46 A complete set of results can be found in Results Review.   Recommendations:  Unable to reach.     Follow-up plan: ICM clinic phone appointment on 03/10/2024.   91 day device clinic remote transmission 04/22/2024.     EP/Cardiology Office Visits:  02/20/2024 with Dr. Mitzie Anda.   05/23/2024 with Dr Alvis Ba.  Recall 05/31/2024 with Creighton Doffing, NP.   Copy of ICM check sent to Dr. Lawana Pray.  3 month ICM trend: 02/04/2024.    12-14 Month ICM trend:     Almyra Jain, RN 02/07/2024 11:17 AM

## 2024-02-18 ENCOUNTER — Other Ambulatory Visit: Payer: Self-pay

## 2024-02-18 ENCOUNTER — Telehealth: Payer: Self-pay | Admitting: Cardiovascular Disease

## 2024-02-18 ENCOUNTER — Encounter (HOSPITAL_COMMUNITY): Payer: Self-pay | Admitting: Emergency Medicine

## 2024-02-18 ENCOUNTER — Emergency Department (HOSPITAL_COMMUNITY)

## 2024-02-18 ENCOUNTER — Encounter: Payer: Self-pay | Admitting: Cardiovascular Disease

## 2024-02-18 ENCOUNTER — Observation Stay (HOSPITAL_COMMUNITY)
Admission: EM | Admit: 2024-02-18 | Discharge: 2024-02-19 | Disposition: A | Discharge status: Home / Self Care | Attending: Family Medicine | Admitting: Family Medicine

## 2024-02-18 DIAGNOSIS — I700 Atherosclerosis of aorta: Secondary | ICD-10-CM | POA: Insufficient documentation

## 2024-02-18 DIAGNOSIS — I130 Hypertensive heart and chronic kidney disease with heart failure and stage 1 through stage 4 chronic kidney disease, or unspecified chronic kidney disease: Secondary | ICD-10-CM | POA: Diagnosis not present

## 2024-02-18 DIAGNOSIS — E876 Hypokalemia: Secondary | ICD-10-CM | POA: Insufficient documentation

## 2024-02-18 DIAGNOSIS — C099 Malignant neoplasm of tonsil, unspecified: Secondary | ICD-10-CM | POA: Diagnosis not present

## 2024-02-18 DIAGNOSIS — R042 Hemoptysis: Principal | ICD-10-CM

## 2024-02-18 DIAGNOSIS — Z789 Other specified health status: Secondary | ICD-10-CM

## 2024-02-18 DIAGNOSIS — J358 Other chronic diseases of tonsils and adenoids: Secondary | ICD-10-CM | POA: Diagnosis not present

## 2024-02-18 DIAGNOSIS — E1122 Type 2 diabetes mellitus with diabetic chronic kidney disease: Secondary | ICD-10-CM | POA: Diagnosis present

## 2024-02-18 DIAGNOSIS — I509 Heart failure, unspecified: Secondary | ICD-10-CM

## 2024-02-18 DIAGNOSIS — E039 Hypothyroidism, unspecified: Secondary | ICD-10-CM | POA: Diagnosis not present

## 2024-02-18 DIAGNOSIS — R221 Localized swelling, mass and lump, neck: Secondary | ICD-10-CM | POA: Diagnosis not present

## 2024-02-18 DIAGNOSIS — E785 Hyperlipidemia, unspecified: Secondary | ICD-10-CM | POA: Diagnosis not present

## 2024-02-18 DIAGNOSIS — N1831 Chronic kidney disease, stage 3a: Secondary | ICD-10-CM | POA: Diagnosis present

## 2024-02-18 DIAGNOSIS — I5022 Chronic systolic (congestive) heart failure: Secondary | ICD-10-CM | POA: Insufficient documentation

## 2024-02-18 DIAGNOSIS — C109 Malignant neoplasm of oropharynx, unspecified: Secondary | ICD-10-CM | POA: Diagnosis not present

## 2024-02-18 LAB — COMPREHENSIVE METABOLIC PANEL WITH GFR
ALT: 19 U/L (ref 0–44)
AST: 20 U/L (ref 15–41)
Albumin: 3.3 g/dL — ABNORMAL LOW (ref 3.5–5.0)
Alkaline Phosphatase: 48 U/L (ref 38–126)
Anion gap: 10 (ref 5–15)
BUN: 34 mg/dL — ABNORMAL HIGH (ref 8–23)
CO2: 21 mmol/L — ABNORMAL LOW (ref 22–32)
Calcium: 9 mg/dL (ref 8.9–10.3)
Chloride: 105 mmol/L (ref 98–111)
Creatinine, Ser: 1.59 mg/dL — ABNORMAL HIGH (ref 0.61–1.24)
GFR, Estimated: 45 mL/min — ABNORMAL LOW (ref >60)
Glucose, Bld: 161 mg/dL — ABNORMAL HIGH (ref 70–99)
Potassium: 4.2 mmol/L (ref 3.5–5.1)
Sodium: 136 mmol/L (ref 135–145)
Total Bilirubin: 1.1 mg/dL (ref 0.0–1.2)
Total Protein: 6.3 g/dL — ABNORMAL LOW (ref 6.5–8.1)

## 2024-02-18 LAB — PROTIME-INR
INR: 1.4 — ABNORMAL HIGH (ref 0.8–1.2)
Prothrombin Time: 16.9 s — ABNORMAL HIGH (ref 11.4–15.2)

## 2024-02-18 LAB — TYPE AND SCREEN
ABO/RH(D): A POS
Antibody Screen: NEGATIVE

## 2024-02-18 LAB — CBC WITH DIFFERENTIAL/PLATELET
Abs Immature Granulocytes: 0.04 10*3/uL (ref 0.00–0.07)
Basophils Absolute: 0.1 10*3/uL (ref 0.0–0.1)
Basophils Relative: 1 %
Eosinophils Absolute: 0.3 10*3/uL (ref 0.0–0.5)
Eosinophils Relative: 2 %
HCT: 48.9 % (ref 39.0–52.0)
Hemoglobin: 16 g/dL (ref 13.0–17.0)
Immature Granulocytes: 0 %
Lymphocytes Relative: 10 %
Lymphs Abs: 1.1 10*3/uL (ref 0.7–4.0)
MCH: 30.8 pg (ref 26.0–34.0)
MCHC: 32.7 g/dL (ref 30.0–36.0)
MCV: 94 fL (ref 80.0–100.0)
Monocytes Absolute: 0.6 10*3/uL (ref 0.1–1.0)
Monocytes Relative: 6 %
Neutro Abs: 8.7 10*3/uL — ABNORMAL HIGH (ref 1.7–7.7)
Neutrophils Relative %: 81 %
Platelets: 140 10*3/uL — ABNORMAL LOW (ref 150–400)
RBC: 5.2 MIL/uL (ref 4.22–5.81)
RDW: 13 % (ref 11.5–15.5)
WBC: 10.8 10*3/uL — ABNORMAL HIGH (ref 4.0–10.5)
nRBC: 0 % (ref 0.0–0.2)

## 2024-02-18 LAB — SURGICAL PCR SCREEN
MRSA, PCR: NEGATIVE
Staphylococcus aureus: NEGATIVE

## 2024-02-18 LAB — GLUCOSE, CAPILLARY: Glucose-Capillary: 109 mg/dL — ABNORMAL HIGH (ref 70–99)

## 2024-02-18 LAB — GROUP A STREP BY PCR: Group A Strep by PCR: NOT DETECTED

## 2024-02-18 MED ORDER — IOHEXOL 350 MG/ML SOLN
75.0000 mL | Freq: Once | INTRAVENOUS | Status: AC | PRN
  Administered 2024-02-18: 75 mL via INTRAVENOUS

## 2024-02-18 MED ORDER — LACTATED RINGERS IV BOLUS: 1000.0000 mL | Freq: Once | INTRAVENOUS | Status: DC

## 2024-02-18 MED ORDER — MUPIROCIN 2 % EX OINT
TOPICAL_OINTMENT | CUTANEOUS | Status: AC
  Filled 2024-02-18: qty 22

## 2024-02-18 MED ORDER — MORPHINE SULFATE (PF) 2 MG/ML IV SOLN
2.0000 mg | INTRAVENOUS | Status: DC | PRN
  Administered 2024-02-18: 2 mg via INTRAVENOUS

## 2024-02-18 MED ORDER — DIGOXIN 125 MCG PO TABS
ORAL_TABLET | ORAL | Status: AC
  Filled 2024-02-18: qty 1

## 2024-02-18 MED ORDER — SACUBITRIL-VALSARTAN 97-103 MG PO TABS
1.0000 | ORAL_TABLET | Freq: Two times a day (BID) | ORAL | Status: DC
  Administered 2024-02-19: 1 via ORAL
  Filled 2024-02-18 (×4): qty 1

## 2024-02-18 MED ORDER — DIGOXIN 125 MCG PO TABS
0.0625 mg | ORAL_TABLET | Freq: Every day | ORAL | Status: DC
  Administered 2024-02-18 – 2024-02-19 (×2): 0.0625 mg via ORAL
  Filled 2024-02-18: qty 1

## 2024-02-18 MED ORDER — CARVEDILOL 12.5 MG PO TABS
ORAL_TABLET | ORAL | Status: AC
  Filled 2024-02-18: qty 1

## 2024-02-18 MED ORDER — CARVEDILOL 6.25 MG PO TABS
ORAL_TABLET | ORAL | Status: AC
  Filled 2024-02-18: qty 1

## 2024-02-18 MED ORDER — MORPHINE SULFATE (PF) 2 MG/ML IV SOLN
INTRAVENOUS | Status: DC
Start: 2024-02-18 — End: 2024-02-19
  Filled 2024-02-18: qty 1

## 2024-02-18 MED ORDER — SPIRONOLACTONE 25 MG PO TABS
ORAL_TABLET | ORAL | Status: AC
  Filled 2024-02-18: qty 1

## 2024-02-18 MED ORDER — CARVEDILOL 12.5 MG PO TABS
18.7500 mg | ORAL_TABLET | Freq: Two times a day (BID) | ORAL | Status: DC
  Administered 2024-02-18 – 2024-02-19 (×2): 18.75 mg via ORAL
  Filled 2024-02-18: qty 1

## 2024-02-18 MED ORDER — ROSUVASTATIN CALCIUM 5 MG PO TABS
10.0000 mg | ORAL_TABLET | Freq: Every day | ORAL | Status: DC
  Filled 2024-02-18: qty 2

## 2024-02-18 MED ORDER — VITAMIN D3 25 MCG (1000 UNIT) PO TABS
2000.0000 [IU] | ORAL_TABLET | Freq: Every day | ORAL | Status: DC
  Administered 2024-02-19: 2000 [IU] via ORAL
  Filled 2024-02-18 (×2): qty 2

## 2024-02-18 MED ORDER — LEVOTHYROXINE SODIUM 75 MCG PO TABS
37.5000 ug | ORAL_TABLET | Freq: Every day | ORAL | Status: DC
  Administered 2024-02-19: 37.5 ug via ORAL

## 2024-02-18 MED ORDER — ONDANSETRON 4 MG PO TBDP: 4.0000 mg | ORAL_TABLET | Freq: Once | ORAL | Status: DC

## 2024-02-18 MED ORDER — MORPHINE SULFATE (PF) 2 MG/ML IV SOLN
INTRAVENOUS | Status: AC
Start: 2024-02-18 — End: 2024-02-19
  Filled 2024-02-18: qty 1

## 2024-02-18 MED ORDER — SPIRONOLACTONE 25 MG PO TABS
25.0000 mg | ORAL_TABLET | Freq: Every day | ORAL | Status: DC
  Administered 2024-02-18 – 2024-02-19 (×2): 25 mg via ORAL
  Filled 2024-02-18: qty 1

## 2024-02-18 MED ORDER — INSULIN ASPART 100 UNIT/ML IJ SOLN: 0.0000 [IU] | Freq: Three times a day (TID) | INTRAMUSCULAR | Status: DC

## 2024-02-18 MED ORDER — MUPIROCIN 2 % EX OINT
1.0000 | TOPICAL_OINTMENT | Freq: Two times a day (BID) | CUTANEOUS | Status: DC
  Administered 2024-02-18 – 2024-02-19 (×2): 1 via NASAL

## 2024-02-18 MED ORDER — IOPAMIDOL (ISOVUE-370) INJECTION 76%
75.0000 mL | Freq: Once | INTRAVENOUS | Status: AC | PRN
  Administered 2024-02-18: 75 mL via INTRAVENOUS

## 2024-02-18 NOTE — ED Provider Notes (Signed)
 7:08 AM Patient signed out to me by previous ED physician. Pt is 78 yo male recently started on eliguis for afib presenting for hemoptysis x 1 episode. One clot with bright red blood. H/o recent throat tickling sensation. Oropharnyx was erythematous. Strep tested and negative. Has chronic mass to right neck that has been worked up previously and benign.  CT neck soft tissue pending.  Physical Exam  BP (!) 132/59 (BP Location: Right Arm)   Pulse 68   Temp 97.7 F (36.5 C) (Oral)   Resp 16   Ht 6' (1.829 m)   Wt 90 kg   SpO2 100%   BMI 26.91 kg/m   Physical Exam  Procedures  Procedures  ED Course / MDM    Medical Decision Making Amount and/or Complexity of Data Reviewed Labs: ordered. Radiology: ordered.  Risk Prescription drug management. Decision regarding hospitalization.   CT neck concerning for right oropharyngeal mass suspicious for oropharyngeal  cancer. I spoke with ENT on call, Dr. Soldatova, who is coming in at [facility] for biopsy. CTA requested and ordered. Pt admitted to hospitalist service.        Quinn Bucco, DO 03/03/24 2104

## 2024-02-18 NOTE — Assessment & Plan Note (Signed)
 Acute on chronic with hemoptysis.  Hemodynamically stable with uncompromised airway.  Unfortunately CT neck is suggestive of malignancy with extensive lymph node involvement.  Plan for ENT evaluation and biopsy. - Admit to family medicine teaching service, attending Dr. Grandville Lax, med/tele - ENT consulted, recs appreciated - CTA neck pending - N.p.o. in the event that surgery is indicated - Hold Eliquis , Plavix  - AM CBC

## 2024-02-18 NOTE — Assessment & Plan Note (Signed)
 Vital signs stable, euvolemic.  Echo less than 20% September 2024.  Dry weight 190-195lbs. - Continue home Coreg , digoxin , Entresto , eplerenone  - Hold home Lokelma  - Check BMP, mag daily - Replete electrolytes as indicated, mag greater than equal to 2, potassium greater than equal to 4 - Continue home as needed 20 mg Lasix  for increased weight - Daily weights - Strict I's and O's - Cardiac telemetry - Consult Cardiology for surgical risk evaluation if indicated

## 2024-02-18 NOTE — Progress Notes (Signed)
   02/18/24 1645  Spiritual Encounters  Type of Visit Initial  Care provided to: Pt not available  Referral source Nurse (RN/NT/LPN)  Reason for visit Advance directives  OnCall Visit No   Chaplain responded to Spiritual Consult for Advance Care Directive.  Chaplain arrived in room and patient is currently being cared for by the medical team.  Chaplain team will follow as appropriate.   Chaplain services remain available as the need arises by Spiritual Consult or for emergent cases, paging [phone]. Chaplain Chasidy Janak, LPN, CHC, ORDM

## 2024-02-18 NOTE — Plan of Care (Signed)

## 2024-02-18 NOTE — ED Triage Notes (Signed)
 Pt c/o spitting up bright red blood one time tonight, takes eliquis . States that he was coughing prior to spitting up the blood.

## 2024-02-18 NOTE — Assessment & Plan Note (Signed)
 Hold home Farxiga  and glipizide . - A1c - Sensitive sliding scale

## 2024-02-18 NOTE — Telephone Encounter (Signed)
 Patient's wife is calling because the patient is currently in the hospital. Patient's wife stated the hospital needs to know the name of the Ear, Nose, and Throat doctor that Dr. Alvis Ba recommended for the patient. Please advise.

## 2024-02-18 NOTE — Assessment & Plan Note (Signed)
 Hypothyroidism-continue Synthroid  37.5 mcg daily HLD-Continue Crestor  10mg  daily

## 2024-02-18 NOTE — Consult Note (Signed)
 ENT CONSULT:  Reason for Consult: hemoptysis and tonsillar mass cervical lymphadenopathy right side  HPI:  78 year old male with significant cardiac history including CAD CHF AICD placement, status post CABG times 12/2005, redo CABG in 2017, history of A-fib, history of type 2 diabetes, on Eliquis  and Plavix  per wife, who presented to the ER earlier today due to an episode of hemoptysis when he coughed up a large bright red blood clot.  Patient reports longstanding history of a large right neck mass for which he had a workup and was assured that it was benign.  Does report some difficulties with swallowing, and a desire to frequently clear his throat.  Denies coughing or choking on liquids or while eating.  CT neck in ED with evidence of a large tonsillar mass on the right side with necrotic lymph nodes concerning for malignancy.  ENT was consulted for further evaluation  Records Reviewed:  Admission H&P from today 78 Y/O M with PMHx of CKD, DM2, AICD, CAD S/P CABG x 4, CHF, HTN, HLD, and Hypothyroidism, presenting to the hospital for hemoptysis, which occurred earlier this morning. Per his wife, he had been changing and trying to clear his throat overnight, but this morning, he spits out blood.  He denies chest pain but does have SOB at baseline. No head or neck pain. He does have right-sided neck swelling, which started 7 years ago. This was extensively worked up, and the workups were negative for the patient and his wife.     Past Medical History:  Diagnosis Date   AICD (automatic cardioverter/defibrillator) present    medtronic-   DR. CROITORU , DR. Mitzie Anda    Anginal pain (HCC)    cp sat 08/11/17   Anxiety    CAD (coronary artery disease)    CHF (congestive heart failure) (HCC)    Complication of anesthesia    took awhile to wake up    Coronary artery disease involving coronary bypass graft    Cyst of neck    right side   DM2 (diabetes mellitus, type 2) (HCC) 08/26/2013   Dyspnea     Heart attack (HCC)    "not sure when" (08/20/2017)   HTN (hypertension) 08/26/2013   Hyperlipidemia 08/26/2013   Hypothyroidism    Left main coronary artery disease    Left renal artery stenosis (HCC)    Genesis 6x12 stent 2007   Obesity (BMI 30.0-34.9) 08/26/2013   Postoperative atrial fibrillation (HCC) 10/15/2005   Presence of permanent cardiac pacemaker    S/P CABG x 4 10/13/2005   LIMA to LAD, SVG to intermediate branch, sequential SVG to PDA and RPL branch, EVH via right thigh   S/P mitral valve replacement with bioprosthetic valve 07/11/2016   31 mm [facility] Mitral bovine bioprosthetic tissue valve   S/P redo CABG x 2 07/11/2016   SVG to PDA and SVG to Intermediate Branch, EVH via left thigh    Past Surgical History:  Procedure Laterality Date   A-FLUTTER ABLATION N/A 05/11/2017   Procedure: A-Flutter Ablation;  Surgeon: Lei Pump, MD;  Location: [facility] INVASIVE CV LAB;  Service: Cardiovascular;  Laterality: N/A;   BIV ICD GENERATOR CHANGEOUT N/A 08/29/2023   Procedure: BIV ICD GENERATOR CHANGEOUT;  Surgeon: Lei Pump, MD;  Location: [facility] INVASIVE CV LAB;  Service: Cardiovascular;  Laterality: N/A;   CARDIAC CATHETERIZATION N/A 06/21/2016   Procedure: Right/Left Heart Cath and Coronary/Graft Angiography;  Surgeon: Arnoldo Lapping, MD;  Location: [facility] INVASIVE CV LAB;  Service: Cardiovascular;  Laterality: N/A;   CARDIAC VALVE REPLACEMENT     CARDIOVERSION N/A 07/19/2016   Procedure: CARDIOVERSION;  Surgeon: Lenise Quince, MD;  Location: [facility] ENDOSCOPY;  Service: Cardiovascular;  Laterality: N/A;   CARDIOVERSION N/A 09/08/2016   Procedure: CARDIOVERSION;  Surgeon: Elmyra Haggard, MD;  Location: [facility] ENDOSCOPY;  Service: Cardiovascular;  Laterality: N/A;   CORONARY ANGIOPLASTY     STENT 2016  [facility]   CORONARY ANGIOPLASTY WITH STENT PLACEMENT     DES in SVG to right coronary artery system   CORONARY ARTERY BYPASS GRAFT  10/13/2005   LIMA to LAD, SVG to  intermediate branch, sequential SVG to PDA and RPL   CORONARY ARTERY BYPASS GRAFT N/A 07/11/2016   Procedure: REDO CORONARY ARTERY BYPASS GRAFTING (CABG) x two using left leg greater saphenous vein harvested endoscopically-SVG to PDA -SVG to RAMUS INTERMEDIATE;  Surgeon: Gardenia Jump, MD;  Location: [facility] OR;  Service: Open Heart Surgery;  Laterality: N/A;   CORONARY ARTERY BYPASS GRAFT N/A 07/11/2016   Procedure: Re-exploration (CABG) for post op bleeding,;  Surgeon: Gardenia Jump, MD;  Location: [facility] OR;  Service: Open Heart Surgery;  Laterality: N/A;   EP IMPLANTABLE DEVICE N/A 07/25/2016   Procedure: BiV ICD Insertion CRT-D;  Surgeon: Will Cortland Ding, MD;  Location: [facility] INVASIVE CV LAB;  Service: Cardiovascular;  Laterality: N/A;   INGUINAL HERNIA REPAIR Left 08/20/2017   INGUINAL HERNIA REPAIR Left 08/20/2017   Procedure: OPEN LEFT INGUINAL HERNIA REPAIR;  Surgeon: Juanita Norlander, MD;  Location: [facility] OR;  Service: General;  Laterality: Left;   LEFT HEART CATH AND CORS/GRAFTS ANGIOGRAPHY N/A 12/18/2016   Procedure: Left Heart Cath and Cors/Grafts Angiography;  Surgeon: Arnoldo Lapping, MD;  Location: [facility] INVASIVE CV LAB;  Service: Cardiovascular;  Laterality: N/A;   LEFT HEART CATH AND CORS/GRAFTS ANGIOGRAPHY N/A 08/22/2017   Procedure: LEFT HEART CATH AND CORS/GRAFTS ANGIOGRAPHY;  Surgeon: Darlis Eisenmenger, MD;  Location: [facility] INVASIVE CV LAB;  Service: Cardiovascular;  Laterality: N/A;   MITRAL VALVE REPLACEMENT N/A 07/11/2016   Procedure: MITRAL VALVE (MV) REPLACEMENT;  Surgeon: Gardenia Jump, MD;  Location: [facility] OR;  Service: Open Heart Surgery;  Laterality: N/A;   MYOCARDICAL PERFUSION  10/09/2007   NORMAL PERFUSION IN ALL REGIONS;NO EVIDENCE OF INDUCIBLE ISCHEMIA;POST STRESS EF% 66   RENAL ARTERY STENT Right 2007   RENAL DOPPLER  03/28/2010   RIGHT RA-NORMAL;LEFT PROXIMAL RA AT STENT-PATENT WITH NO EVIDENCE OF SIGN DIAMETER REDUCTION. R & L KIDNEYS: EQUAL IN SIZE,SYMMETRICAL IN SHAPE.   RIGHT  HEART CATH N/A 01/08/2020   Procedure: RIGHT HEART CATH;  Surgeon: Darlis Eisenmenger, MD;  Location: [facility] INVASIVE CV LAB;  Service: Cardiovascular;  Laterality: N/A;   TEE WITHOUT CARDIOVERSION N/A 06/15/2016   Procedure: TRANSESOPHAGEAL ECHOCARDIOGRAM (TEE);  Surgeon: Luana Rumple, MD;  Location: [facility] ENDOSCOPY;  Service: Cardiovascular;  Laterality: N/A;   TEE WITHOUT CARDIOVERSION N/A 07/11/2016   Procedure: TRANSESOPHAGEAL ECHOCARDIOGRAM (TEE);  Surgeon: Gardenia Jump, MD;  Location: [facility] OR;  Service: Open Heart Surgery;  Laterality: N/A;   TEE WITHOUT CARDIOVERSION N/A 07/19/2016   Procedure: TRANSESOPHAGEAL ECHOCARDIOGRAM (TEE);  Surgeon: Lenise Quince, MD;  Location: [facility] ENDOSCOPY;  Service: Cardiovascular;  Laterality: N/A;   TRANSESOPHAGEAL ECHOCARDIOGRAM  10/19/2005   NORMAL LV; MILD TO MODERATE AMOUNT OF SOFT ATHEROMATOUS PLAQUE OF THE THORACIC AORTA; THE LEFT ATRIUM IS MILDLY DILATED;LEFT ATRIAL APPENDAGE FUNCTION IS NORMAL;NO THROMBUS IDENTIFIED. SMALL PFO WITH RIGHT TO LEFT SHUNT    Family History  Problem Relation Age of Onset   Heart attack Mother    Heart disease Mother    Heart attack Father    Heart disease Father    Hypertension Father    Hypertension Sister    Heart disease Maternal Grandmother     Social History:  reports that he has never smoked. He has never used smokeless tobacco. He reports that he does not drink alcohol and does not use drugs.  Allergies:  Allergies  Allergen Reactions   Xanax [Alprazolam] Other (See Comments)    Pt feels very weak, tired and feels paralyzed      Medications: I have reviewed the patient's current medications.  The PMH, PSH, Medications, Allergies, and SH were reviewed and updated.  ROS: Constitutional: Negative for fever, weight loss and weight gain. Cardiovascular: Negative for chest pain and dyspnea on exertion. Respiratory: Is not experiencing shortness of breath at rest. Gastrointestinal: Negative for nausea and  vomiting. Neurological: Negative for headaches. Psychiatric: The patient is not nervous/anxious  Blood pressure (!) 146/72, pulse 65, temperature 97.7 F (36.5 C), temperature source Oral, resp. rate (!) 23, height 6' (1.829 m), weight 90 kg, SpO2 98%. Body mass index is 26.91 kg/m.  PHYSICAL EXAM:  Exam: General: Well-developed, well-nourished Communication and Voice: Muffled voice Respiratory Respiratory effort: Equal inspiration and expiration without stridor Cardiovascular Peripheral Vascular: Warm extremities with equal color/perfusion Eyes: No nystagmus with equal extraocular motion bilaterally Neuro/Psych/Balance: Patient oriented to person, place, and time; Appropriate mood and affect;  Cranial nerves I-XII are intact  Head and Face Inspection: Normocephalic and atraumatic without mass or lesion Palpation: Facial skeleton intact without bony stepoffs Salivary Glands: No mass or tenderness Facial Strength: Facial motility symmetric and full bilaterally ENT Pinna: External ear intact and fully developed External canal: Canal is patent with intact skin Tympanic Membrane: Clear and mobile External Nose: No scar or anatomic deformity Internal Nose: Septum is relatively straight. No polyp, or purulence. Mucosal edema and erythema present.  Bilateral inferior turbinate hypertrophy.  Lips, Teeth, and gums: Mucosa and teeth intact and viable TMJ: No pain to palpation with full mobility Oral cavity/oropharynx: No erythema or exudate, no lesions present Large friable exophytic mass which involves the entire right tonsillar fossa and soft palate, biopsied after topical anesthesia Nasopharynx: No mass or lesion with intact mucosa Hypopharynx: Intact mucosa without pooling of secretions Larynx Glottic: Full true vocal cord mobility without lesion or mass Supraglottic: Normal appearing epiglottis and AE folds Interarytenoid Space: Moderate pachydermia&edema Subglottic Space: Patent  without lesion or edema Neck Neck and Trachea: Midline trachea without mass or lesion Thyroid : No mass or nodularity Lymphatics: Large at least 5 to 6 cm right cervical lymph node, overlying skin intact  Procedure: Preoperative diagnosis: Right oropharyngeal mass hemoptysis and right cervical lymphadenopathy glottic airway patent   Postoperative diagnosis:   Same  Procedure: Flexible fiberoptic laryngoscopy  Surgeon: Artice Last, MD  Anesthesia: Topical lidocaine  and Afrin Complications: None Condition is stable throughout exam  Indications and consent:  The patient presents to the clinic with above symptoms. Indirect laryngoscopy view was incomplete. Thus it was recommended that they undergo a flexible fiberoptic laryngoscopy. All of the risks, benefits, and potential complications were reviewed with the patient preoperatively and verbal informed consent was obtained.  Procedure: The patient was seated upright in the clinic. Topical lidocaine  and Afrin were applied to the nasal cavity. After adequate anesthesia had occurred, I then proceeded to pass the flexible telescope into the nasal cavity. The nasal cavity  was patent without rhinorrhea or polyp. The nasopharynx was also patent without mass or lesion. There was evidence of a large exophytic mass involving right tonsil and soft palate but without any obvious involvement of the base of the tongue.  No evidence of blood or clot noted during scope exam, there were no signs of pooling of secretions in the piriform sinuses. The true vocal folds were mobile bilaterally. There were no signs of glottic or supraglottic mucosal lesion or mass. There was moderate interarytenoid pachydermia and post cricoid edema. The telescope was then slowly withdrawn and the patient tolerated the procedure throughout.   Procedure: Biopsy of the right oropharyngeal mass  ATTENDING: Artice Last, M.D.   PREOPERATIVE DIAGNOSIS(ES): tonsillar neoplasm    POSTOPERATIVE DIAGNOSIS(ES): Same  PROCEDURE PERFORMED: biopsy of right oropharyngeal mass  INDICATIONS FOR PROCEDURE: right tonsillar mass on exam concerning for malignancy  The risks and benefits of the surgical procedure have been explained in detail to the patient and they have elected to proceed.  CONSENT:  Informed consent was obtained prior to the procedure after discussion of risks, benefits, and alternatives and expected outcomes were discussed with the patient; consent placed in chart. The possibilities of reaction to medication, bleeding, infection, inadequate biopsy, the need for additional procedures, failure to diagnose a condition, and creating a complication requiring transfusion or operation were discussed with the patient. The patient concurred with the proposed plan, giving informed consent.    UNIVERSAL PROTOCOL/ TIMEOUT: Preprocedure verification is complete- patient verified and consents confirmed.  H&P REVIEW: The patient's history and physical were reviewed today prior to procedure. All medications were reviewed and updated as well.  ANESTHESIA: local anesthesia with Cetacaine    PROCEDURE DETAILS: The patient was seated upright in bed, and a right tonsillar mass soft palate area was sprayed with Cetacaine  spray several times. We then used a Blakesley forceps to take several tissue samples along right tonsillar mass. The patient was then asked to gargle with cold water, and oropharynx was suctioned and examined again. There was evidence of hemostasis.   ESTIMATED BLOOD LOSS: Minimal  SPECIMEN(S) REMOVED: Right tonsillar mass   DISPOSITION OF SPECIMEN(S): Permanent pathology with p16 testing - sent   FINDINGS:  No evidence of significant bleeding or other complication  Studies Reviewed: CT chest with contrast and CT neck with contrast 02/18/24 No acute findings within the chest. No explanation for patient's hemoptysis within the chest. 2. Mild diffuse bronchial  wall thickening is nonspecific, but can be seen in the setting of bronchitis. 3. New 5 mm nodule within the posterior and medial left lower lobe. If the patient is at high risk for bronchogenic carcinoma, follow-up chest CT at 1year is recommended. If the patient is at low risk, no follow-up is needed  CT neck  FINDINGS: Pharynx and larynx: 2.2 x 3.1 x 3.7 cm mass centered at the right palatine tonsil/pharynx and extending inferiorly to the glossotonsillar sulcus. The mass likely also involves the soft palate. Resultant mild-to-moderate narrowing of the oropharyngeal airway. Nonspecific 5 mm cystic-appearing lesion within the vallecula on the right (series 1, image 78) (series 7, image 73).   Salivary glands: No inflammation, mass, or stone.   Thyroid : Unremarkable.   Lymph nodes: 6.5 x 4.0 cm predominantly low-attenuation ovoid lesion centered at the right level 2 station concerning for a cystic/necrotic lymph node (from nodal metastatic disease). No pathologically enlarged lymph nodes identified elsewhere within the neck.   Vascular: The major vascular structures of the neck are patent.  Atherosclerotic plaque within the visualized thoracic aorta, proximal major branch vessels of the neck, as well as carotid and vertebral arteries.   Limited intracranial: No evidence of an acute intracranial abnormality within the field of view.   Visualized orbits: No orbital mass or acute orbital finding.   Mastoids and visualized paranasal sinuses: Portions of the frontal sinuses are excluded from the field of view superiorly. No significant paranasal sinus disease at the imaged levels. No mastoid effusion.   Skeleton: Subcentimeter sclerotic lesion within the T3 vertebral body, stable dating back to the chest CT of 07/07/2016 and likely benign. Mild T1 superior endplate compression deformity, new from the prior chest CT of 11/01/2022 but otherwise age-indeterminate. Cervical  spondylosis.   Upper chest: Separately reported on same day chest CT.   Other: Partially imaged forehead lipoma near midline (measuring at least 12 x 5 mm).   Impressions #1, #2 and #3 called by telephone at the time of interpretation on 02/18/2024 at 7:45 am to provider Dr. Martina Sledge, who verbally acknowledged these results.   IMPRESSION: 1. 3.7 cm mass centered at the right palatine tonsil/pharynx and extending inferiorly to the glossotonsillar sulcus. The mass likely also involves the soft palate. This most consistent with a mucosal/submucosal neoplasm (likely squamous cell carcinoma). Resultant mild-to-moderate effacement of the oropharyngeal airway. ENT referral and direct visualization recommended. 2. 6.5 x 4.0 cm predominantly low attenuation lesion centered at the right level 2 station, concerning for a cystic/necrotic lymph node (from nodal metastatic disease) given findings described in impression #1. 3. Nonspecific 5 mm cystic-appearing lesion within the vallecula on the right. Direct visualization recommended. 4. Partially imaged forehead lipoma, measuring at least 12 x 5 mm. 5. Mild T1 superior endplate vertebral compression deformity, new from the prior chest CT of 11/01/2022 but otherwise age-indeterminate.  CT angio of the neck 02/18/24 MPRESSION: 1. Negative for large vessel occlusion and generally mild atherosclerosis in the neck. No hemodynamically significant extracranial stenosis. 2. Advanced atherosclerosis at the skull base with up to severe stenosis of both the distal Right ICA siphon (supraclinoid segment) and Left vertebral artery (V4 segment just proximal to PICA which remains patent). 3. Bulky Right oropharyngeal Mass and cystic/necrotic Right neck lymph node as detailed on Neck CT this morning separately. 4.  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  Patient already had CT neck and CT chest, but we discussed that he might potentially need PET/CT to complete his cancer staging.   - Follow-up surgical pathology from biopsy results performed today -Will discuss his case during tumor board  - Will likely require rad onc heme-onc consultation, and surg-onc consult to determine if resectable  - Will require baseline audiogram due to current history of decreased hearing and tinnitus -can be done outpatient - The voice is somewhat muffled and he does struggle with swallowing from time to time -will benefit from speech evaluation and swallow assessment - diet per SLP  Thank you for allowing me to participate in the care of this patient. Please do not hesitate to contact me with any questions or concerns.   Artice Last, MD Otolaryngology [facility] Phone: [phone] Fax: [phone]    02/18/2024, 10:48 AM

## 2024-02-18 NOTE — Telephone Encounter (Signed)
 I honestly do not remember for sure, but it was probably Dr. Donalee Fruits (or possibly Dr. Darlin Ehrlich).

## 2024-02-18 NOTE — Telephone Encounter (Signed)
 Spoke with pt's wife and stated that Dr. Alvis Ba states he doesn't remember the exact name originally given but that he would recommend Dr. Donalee Fruits or Dr. Darlin Ehrlich. Pt's wife verbalized understanding and had no further questions at this time.

## 2024-02-18 NOTE — ED Notes (Signed)
 ED Provider at bedside.

## 2024-02-18 NOTE — Plan of Care (Addendum)
 FMTS Brief Progress Note  S: Saw patient at bedside for night rounds.  Patient was laying comfortably in bed with wife at bedside.  Reports presenting to the ED for hemoptysis which occurred this morning after an aggressive cough.  Patient stated he recently was started on Eliquis  by his cardiologist for A-fib and instructed to go to the ED if he develops any bleeding.  Patient also reported that he has had neck mass evaluated today for a while and unaware it was possibly malignant.  Has not had any issues with swallowing untill today after his biopsy.  Tonight patient say he is in significant pain and having difficulty swallowing due to pain from the area of biopsy.  He has had hemoptysis by suspect it's secondary to his oropharyngeal biopsy obtained today.  O: BP 128/80 (BP Location: Right Arm)   Pulse 70   Temp 98.7 F (37.1 C) (Oral)   Resp 20   Ht 6' (1.829 m)   Wt 90 kg   SpO2 97%   BMI 26.91 kg/m   General: Alert, well appearing, NAD HEENT: MMM, Golf-ball size mass on the right side of the neck CV: RRR, no murmurs, normal S1/S2 Pulm: CTAB, good WOB on RA, no crackles or wheezing Abd: Soft, no distension, no tenderness Ext: No BLE edema  A/P: Hemoptysis Recent initiation of Eliquis  likely increase his risk of bleed. Hemoglobin has been stable since admission.  Still reports some hemoptysis however suspect this is likely secondary to his oropharyngeal biopsy obtained today for a neck mass. -Trend hemoglobin with a.m. CBC - Continue holding home Eliquis  - Orders reviewed. Labs for AM ordered, which was adjusted as needed.   Neck mass Suspected tonsillar mass and submandibular mass.  CT neck obtained and ENT completed an oropharyngeal biopsy, pathology still pending. Patient endorsing postprocedure pain. -Currently n.p.o. until SLP eval completed - Rx morphine  2 mg every 4 as needed for pain control - ENT following, appreciate recs    Goble Last, MD 02/18/2024, 9:35  PM PGY-3, [facility] Night Resident  Please page [phone] with questions.

## 2024-02-18 NOTE — H&P (Signed)
 Hospital Admission History and Physical Service Pager: [phone]  Patient name: Christian Bowman Medical record number: [id number] Date of Birth: 12-30-1945 Age: 78 y.o. Gender: male  Primary Care Provider: Luana Rumple, MD Consultants: ENT, Oncology Code Status: DNR-Interventions which was confirmed with family if patient unable to confirm   Preferred Emergency Contact: Jerolyn Moore, [phone]  Chief Complaint: Mopped assist  Assessment and Plan: Christian Bowman is a 78 y.o. male presenting with hemoptysis. Differential for this patient's presentation of this includes retropharyngeal mass, upper GI bleed, and epistaxis.  Likely secondary to known retropharyngeal mass in the setting of recent initiation of Eliquis .  Clinical history and exam less consistent with upper GI bleed.  Assessment & Plan Mass in neck Acute on chronic with hemoptysis.  Hemodynamically stable with uncompromised airway.  Unfortunately CT neck is suggestive of malignancy with extensive lymph node involvement.  Plan for ENT evaluation and biopsy. - Admit to family medicine teaching service, attending Dr. Grandville Lax, med/tele - ENT consulted, recs appreciated - CTA neck pending - N.p.o. in the event that surgery is indicated - Hold Eliquis , Plavix  - AM CBC Chronic CHF (congestive heart failure) (HCC) Vital signs stable, euvolemic.  Echo less than 20% September 2024.  Dry weight 190-195lbs. - Continue home Coreg , digoxin , Entresto , eplerenone  - Hold home Lokelma  - Check BMP, mag daily - Replete electrolytes as indicated, mag greater than equal to 2, potassium greater than equal to 4 - Continue home as needed 20 mg Lasix  for increased weight - Daily weights - Strict I's and O's - Cardiac telemetry - Consult Cardiology for surgical risk evaluation if indicated Type 2 diabetes mellitus with stage 3a chronic kidney disease, without long-term current use of insulin  (HCC) Hold home Farxiga  and glipizide . - A1c -  Sensitive sliding scale Chronic health problem Hypothyroidism-continue Synthroid  37.5 mcg daily HLD-Continue Crestor  10mg  daily     FEN/GI: N.p.o. VTE Prophylaxis: None, active bleed  Disposition: MedSurg  History of Present Illness:  Christian Bowman is a 78 y.o. male presenting with hemoptysis.  Patient reports he was told to present to the emergency room if he had any bleeding at all after starting Eliquis  1 month ago.  He had 1 episode of coin sized hemoptysis last evening which is why he presented.  He has not had any worsening lightheadedness, dizziness or difficulty breathing outside of the norm for his extensive cardiac history.  Recently noted to have increased atrial fibrillation burden.  Patient's aspirin  was stopped and patient was started on Eliquis  5 mg twice daily and mid April.  In the ED, patient vital signs were stable with unremarkable labs.  ENT was consulted with concern for malignant retropharyngeal mass that has been untreated for several years.  They recommended pursuing CTA of the head and neck and plan for bedside biopsy.  They also recommended consulting oncology for discussion at tumor board this Wednesday.  Per Dr. Martina Sledge, (ED provider) she will place consult and talk with oncology.  Review Of Systems: Per HPI.  Pertinent Past Medical History: Chronic congestive heart failure, EF 20 to 25% History of mitral valve bioprosthesis (Edwards Magna 31 mm, implanted 2017  Dual-chamber biventricular defibrillator (Medtronic cobalt) History of atrial flutter History of TIA  History of right submandibular mass History of left renal artery stenosis s/p stent 2007 Hypertension  Remainder reviewed in history tab.   Pertinent Past Surgical History: Biventricular ICD/defibrillator placement Multiple heart catheterizations Mitral valve replacement CABG  Remainder reviewed in history tab.  Pertinent Social History: Tobacco use: None Alcohol use: None Other Substance  use: None Lives with spouse at home  Pertinent Family History: Reviewed.  Remainder reviewed in history tab.   Important Outpatient Medications: Eliquis  5 mg twice daily Coreg  12.5 mg 1-1/2 tablets twice daily Vitamin D  Plavix  75 mg daily Farxiga  10 mg daily Digoxin  0.125 mg 1/2 pill daily Entresto  97-103 mg Eplerenone  50 mg daily Lasix  20 mg daily as needed Glipizide  doing 5 mg daily Synthroid  75 mcg daily Nitrostat  as needed Crestor  10 mg daily Lokelma  10 mg once daily  Remainder reviewed in medication history.   Objective: BP (!) 146/72   Pulse 65   Temp 97.7 F (36.5 C) (Oral)   Resp (!) 23   Ht 6' (1.829 m)   Wt 90 kg   SpO2 98%   BMI 26.91 kg/m  Exam: General: A&O, NAD, lying comfortably in hospital bed HEENT: No sign of trauma, EOM grossly intact, moist mucous membranes, 3 to 4 cm nonmobile, nontender, palpable mass under the right mandible, no redness or swelling, no obvious internal oropharyngeal swelling or uvula deviation Cardiac: RRR, no m/r/g Respiratory: CTAB, normal WOB, no w/c/r GI: Soft, NTTP, non-distended, no rebound or guarding Extremities: NTTP, no peripheral edema. Neuro: Moves all four extremities appropriately. Psych: Appropriate mood and affect   Labs:  CBC BMET  Recent Labs  Lab 02/18/24 0350  WBC 10.8*  HGB 16.0  HCT 48.9  PLT 140*   Recent Labs  Lab 02/18/24 0515  NA 136  K 4.2  CL 105  CO2 21*  BUN 34*  CREATININE 1.59*  GLUCOSE 161*  CALCIUM  9.0      EKG: Pending   Imaging Studies Performed:  CT Soft Tissue Neck 1. 3.7 cm mass centered at the right palatine tonsil/pharynx and extending inferiorly to the glossotonsillar sulcus. The mass likely also involves the soft palate. This most consistent with a mucosal/submucosal neoplasm (likely squamous cell carcinoma). Resultant mild-to-moderate effacement of the oropharyngeal airway. ENT referral and direct visualization recommended. 2. 6.5 x 4.0 cm  predominantly low attenuation lesion centered at the right level 2 station, concerning for a cystic/necrotic lymph node (from nodal metastatic disease) given findings described in impression #1. 3. Nonspecific 5 mm cystic-appearing lesion within the vallecula on the right. Direct visualization recommended. 4. Partially imaged forehead lipoma, measuring at least 12 x 5 mm. 5. Mild T1 superior endplate vertebral compression deformity, new from the prior chest CT of 11/01/2022 but otherwise age-indeterminate.  CT Chest 1. No acute findings within the chest. No explanation for patient's hemoptysis within the chest. 2. Mild diffuse bronchial wall thickening is nonspecific, but can be seen in the setting of bronchitis. 3. New 5 mm nodule within the posterior and medial left lower lobe. If the patient is at high risk for bronchogenic carcinoma, follow-up chest CT at 1year is recommended. If the patient is at low risk, no follow-up is needed. This recommendation follows the consensus statement: Guidelines for Management of Small Pulmonary Nodules Detected on CT Scans: A Statement from the Fleischner Society as published in Radiology 2005; 237:395-400. 4.  Aortic Atherosclerosis (ICD10-I70.0).   Ivin Marrow, MD 02/18/2024, 11:15 AM PGY-2, [facility]  FPTS Intern pager: [phone], text pages welcome Secure chat group [facility] Teaching Service

## 2024-02-19 ENCOUNTER — Encounter (HOSPITAL_COMMUNITY): Payer: Self-pay | Admitting: Emergency Medicine

## 2024-02-19 DIAGNOSIS — H9319 Tinnitus, unspecified ear: Secondary | ICD-10-CM

## 2024-02-19 DIAGNOSIS — R042 Hemoptysis: Secondary | ICD-10-CM | POA: Diagnosis not present

## 2024-02-19 DIAGNOSIS — R221 Localized swelling, mass and lump, neck: Secondary | ICD-10-CM | POA: Diagnosis not present

## 2024-02-19 DIAGNOSIS — H9190 Unspecified hearing loss, unspecified ear: Secondary | ICD-10-CM

## 2024-02-19 LAB — CBC
HCT: 46.9 % (ref 39.0–52.0)
Hemoglobin: 15.4 g/dL (ref 13.0–17.0)
MCH: 30.4 pg (ref 26.0–34.0)
MCHC: 32.8 g/dL (ref 30.0–36.0)
MCV: 92.7 fL (ref 80.0–100.0)
Platelets: 121 10*3/uL — ABNORMAL LOW (ref 150–400)
RBC: 5.06 MIL/uL (ref 4.22–5.81)
RDW: 13.1 % (ref 11.5–15.5)
WBC: 7.2 10*3/uL (ref 4.0–10.5)
nRBC: 0 % (ref 0.0–0.2)

## 2024-02-19 LAB — BASIC METABOLIC PANEL WITH GFR
Anion gap: 7 (ref 5–15)
BUN: 30 mg/dL — ABNORMAL HIGH (ref 8–23)
CO2: 25 mmol/L (ref 22–32)
Calcium: 8.9 mg/dL (ref 8.9–10.3)
Chloride: 107 mmol/L (ref 98–111)
Creatinine, Ser: 1.75 mg/dL — ABNORMAL HIGH (ref 0.61–1.24)
GFR, Estimated: 40 mL/min — ABNORMAL LOW (ref >60)
Glucose, Bld: 102 mg/dL — ABNORMAL HIGH (ref 70–99)
Potassium: 4.4 mmol/L (ref 3.5–5.1)
Sodium: 139 mmol/L (ref 135–145)

## 2024-02-19 LAB — HEMOGLOBIN A1C
Hgb A1c MFr Bld: 6.6 % — ABNORMAL HIGH (ref 4.8–5.6)
Mean Plasma Glucose: 142.72 mg/dL

## 2024-02-19 LAB — MAGNESIUM: Magnesium: 2.1 mg/dL (ref 1.7–2.4)

## 2024-02-19 LAB — GLUCOSE, CAPILLARY
Glucose-Capillary: 100 mg/dL — ABNORMAL HIGH (ref 70–99)
Glucose-Capillary: 159 mg/dL — ABNORMAL HIGH (ref 70–99)

## 2024-02-19 LAB — TSH: TSH: 3.081 u[IU]/mL (ref 0.350–4.500)

## 2024-02-19 MED ORDER — LEVOTHYROXINE SODIUM 75 MCG PO TABS
ORAL_TABLET | ORAL | Status: AC
  Filled 2024-02-19: qty 1

## 2024-02-19 MED ORDER — LEVOTHYROXINE SODIUM 75 MCG PO TABS: 37.5000 ug | ORAL_TABLET | Freq: Every day | ORAL | Status: DC

## 2024-02-19 NOTE — Assessment & Plan Note (Deleted)
 Hypothyroidism-continue Synthroid  37.5 mcg daily HLD-Continue Crestor  10mg  daily

## 2024-02-19 NOTE — Assessment & Plan Note (Deleted)
 Vital signs stable, euvolemic.  Echo less than 20% September 2024.  Dry weight 190-195lbs. - Continue home Coreg , digoxin , Entresto , eplerenone  - Hold home Lokelma  - AM BMP, mag daily - Replete electrolytes as indicated, K<4 and Mag <2. - Continue home Lasix  20 mg PRN for increased weight - Daily weights *** - Strict I&O's - Cardiac telemetry *** - Consult Cardiology for surgical risk evaluation if indicated

## 2024-02-19 NOTE — Discharge Instructions (Addendum)
 Dear Christian Bowman,   Thank you for letting us  participate in your care! We are so glad you are feeling better. You were admitted for bleeding. ENT biopsied the mass in your neck - these results should be back in several days. Call their office to make an appointment.   POST-HOSPITAL & CARE INSTRUCTIONS We recommend following up with your PCP within 1 week from being discharged from the hospital. Please let PCP/Specialists know of any changes in medications that were made which you will be able to see in the medications section of this packet. Please also follow up with ENT to talk about the results of your biopsy: Dr. Larkin Plumb @ [facility] [phone]) [address] [place], [place] [address]  DOCTOR'S APPOINTMENTS & FOLLOW UP Future Appointments  Date Time Provider Department Center  02/20/2024 10:20 AM Darlis Eisenmenger, MD [facility] None  03/10/2024  7:20 AM CVD HVT DEVICE REMOTES [facility] H&V  03/13/2024  9:30 AM Baby Bolt, MD [facility] None  04/22/2024  8:10 AM CVD HVT DEVICE REMOTES [facility] H&V  05/23/2024  9:40 AM Croitoru, Karyl Paget, MD [facility] H&V  07/22/2024  8:10 AM [facility] HVT DEVICE REMOTES [facility] H&V     Thank you for choosing [facility]! Take care and be well!  Family Medicine Teaching Service Inpatient Team [place]  [facility]  [address] [place], [place] [address] [phone]

## 2024-02-19 NOTE — Progress Notes (Addendum)
 FMTS ATTENDING ADMISSION NOTE Christian Hancock,MD I  have seen and examined this patient, reviewed their chart. I have discussed this patient with the resident. I agree with the resident's findings, assessment and care plan.   The patient denies any pain or bleeding. He has been NPO since yesterday due to difficulty swallowing s/p biopsy. He denies any hemoptysis. He is now hungry and wants to eat.  Exam: Gen: No distress HEENT: Right submandibular mass unchaged Heart: S1 S2 normal, no murmurs.  Lungs: CTA B/L  A/P: Oropharyngeal neoplasm:  S/P biopsy day 1 Appreciate ENT's recs Outpatient f/u with ENT and Onc  PAF/Systolic CHF: Was on Eliquis  and Plavix  Held due to hemoptysis Plan to resume either Eliquis /Plavix  for now and have him F/U with Cardiology to adjust his regimen Continue other home medication They have F/U with Cardiology tomorrow per his wife  Acute on chronic CKDIIIa: Likely prerenal - was NPO overnight Caution with IVF due to his cardiovascular status I spoke with his wife (post initial eval telephone call conversation) and discussed the need for oral hydration and rechecking kidney functions with PCP or Card She stated they will have their cardiologist check tomorrow  Note, he is on Lokelma  as his chronic medication at home. Apparently he has been refilling this medication regularly. He is also on Lasix  - increased risk for hypokalemia Will defer to PCP and Cardiology to discontinue this medication if appropriate

## 2024-02-19 NOTE — Assessment & Plan Note (Deleted)
 Hold home Farxiga  and glipizide . A1c 6.6. - Sensitive SSI

## 2024-02-19 NOTE — Progress Notes (Signed)
 ENT PROGRESS NOTE   Subjective: Patient seen and examined at bedside. No issues overnight. Had some emesis after getting pain medications. Reports no blood when he had emesis. Denies persistent hemoptysis.   Objective: Vital signs in last 24 hours: Temp:  [97.6 F (36.4 C)-98.7 F (37.1 C)] 97.7 F (36.5 C) (05/20 0748) Pulse Rate:  [60-82] 60 (05/20 0748) Resp:  [18-20] 20 (05/20 0748) BP: (114-156)/(54-93) 116/63 (05/20 0748) SpO2:  [95 %-97 %] 96 % (05/20 0748)  Physical Exam  BP 116/63 (BP Location: Right Arm)   Pulse 60   Temp 97.7 F (36.5 C)   Resp 20   Ht 6' (1.829 m)   Wt 90 kg   SpO2 96%   BMI 26.91 kg/m    Assessment/Plan: General: Well-developed, well-nourished Communication and Voice: Muffled voice Respiratory Respiratory effort: Equal inspiration and expiration without stridor Cardiovascular Peripheral Vascular: Warm extremities with equal color/perfusion Eyes: No nystagmus with equal extraocular motion bilaterally Neuro/Psych/Balance: Patient oriented to person, place, and time; Appropriate mood and affect;  Cranial nerves I-XII are intact  Head and Face Inspection: Normocephalic and atraumatic without mass or lesion Palpation: Facial skeleton intact without bony stepoffs Salivary Glands: No mass or tenderness Facial Strength: Facial motility symmetric and full bilaterally ENT Pinna: External ear intact and fully developed External canal: Canal is patent with intact skin Tympanic Membrane: Clear and mobile External Nose: No scar or anatomic deformity Internal Nose: Septum is relatively straight. No polyp, or purulence. Mucosal edema and erythema present.  Bilateral inferior turbinate hypertrophy.  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  Patient already had CT neck and CT chest, but we discussed that he might potentially need PET/CT to complete his cancer staging.    - Follow-up surgical pathology following biopsy -Will discuss his case during tumor board  - Will likely require rad onc heme-onc consultation, and surg-onc consult to determine if resectable  - Will require baseline audiogram due to current history of decreased hearing and tinnitus -can be done outpatient - The voice is somewhat muffled but he passed speech evaluation - continue diet per SLP - outpatient f/u with me in 2 weeks outpatient   Ok to d/c home from ENT standpoint   LOS: 0 days    Artice Last, MD 02/19/2024, 12:02 PM

## 2024-02-19 NOTE — Evaluation (Signed)
 Clinical/Bedside Swallow Evaluation Patient Details  Name: Christian Bowman MRN: [id number] Date of Birth: 01-25-1947  Today's Date: 02/19/2024 Time: SLP Start Time (ACUTE ONLY): 0915 SLP Stop Time (ACUTE ONLY): 0949 SLP Time Calculation (min) (ACUTE ONLY): 34 min  Past Medical History:  Past Medical History:  Diagnosis Date   AICD (automatic cardioverter/defibrillator) present    medtronic-   DR. CROITORU , DR. Mitzie Anda    Anginal pain (HCC)    cp sat 08/11/17   Anxiety    CAD (coronary artery disease)    CHF (congestive heart failure) (HCC)    Complication of anesthesia    took awhile to wake up    Coronary artery disease involving coronary bypass graft    Cyst of neck    right side   DM2 (diabetes mellitus, type 2) (HCC) 08/26/2013   Dyspnea    Heart attack (HCC)    "not sure when" (08/20/2017)   HTN (hypertension) 08/26/2013   Hyperlipidemia 08/26/2013   Hypothyroidism    Left main coronary artery disease    Left renal artery stenosis (HCC)    Genesis 6x12 stent 2007   Obesity (BMI 30.0-34.9) 08/26/2013   Postoperative atrial fibrillation (HCC) 10/15/2005   Presence of permanent cardiac pacemaker    S/P CABG x 4 10/13/2005   LIMA to LAD, SVG to intermediate branch, sequential SVG to PDA and RPL branch, EVH via right thigh   S/P mitral valve replacement with bioprosthetic valve 07/11/2016   31 mm [facility] Mitral bovine bioprosthetic tissue valve   S/P redo CABG x 2 07/11/2016   SVG to PDA and SVG to Intermediate Branch, EVH via left thigh   Past Surgical History:  Past Surgical History:  Procedure Laterality Date   A-FLUTTER ABLATION N/A 05/11/2017   Procedure: A-Flutter Ablation;  Surgeon: Lei Pump, MD;  [facility]: [facility] INVASIVE CV LAB;  Service: Cardiovascular;  Laterality: N/A;   BIV ICD GENERATOR CHANGEOUT N/A 08/29/2023   Procedure: BIV ICD GENERATOR CHANGEOUT;  Surgeon: Lei Pump, MD;  Location: [facility] INVASIVE CV LAB;  Service: Cardiovascular;   Laterality: N/A;   CARDIAC CATHETERIZATION N/A 06/21/2016   Procedure: Right/Left Heart Cath and Coronary/Graft Angiography;  Surgeon: Arnoldo Lapping, MD;  Location: [facility] INVASIVE CV LAB;  Service: Cardiovascular;  Laterality: N/A;   CARDIAC VALVE REPLACEMENT     CARDIOVERSION N/A 07/19/2016   Procedure: CARDIOVERSION;  Surgeon: Lenise Quince, MD;  Location: [facility] ENDOSCOPY;  Service: Cardiovascular;  Laterality: N/A;   CARDIOVERSION N/A 09/08/2016   Procedure: CARDIOVERSION;  Surgeon: Elmyra Haggard, MD;  Location: [facility] ENDOSCOPY;  Service: Cardiovascular;  Laterality: N/A;   CORONARY ANGIOPLASTY     STENT 2016  [facility]   CORONARY ANGIOPLASTY WITH STENT PLACEMENT     DES in SVG to right coronary artery system   CORONARY ARTERY BYPASS GRAFT  10/13/2005   LIMA to LAD, SVG to intermediate branch, sequential SVG to PDA and RPL   CORONARY ARTERY BYPASS GRAFT N/A 07/11/2016   Procedure: REDO CORONARY ARTERY BYPASS GRAFTING (CABG) x two using left leg greater saphenous vein harvested endoscopically-SVG to PDA -SVG to RAMUS INTERMEDIATE;  Surgeon: Gardenia Jump, MD;  Location: [facility] OR;  Service: Open Heart Surgery;  Laterality: N/A;   CORONARY ARTERY BYPASS GRAFT N/A 07/11/2016   Procedure: Re-exploration (CABG) for post op bleeding,;  Surgeon: Gardenia Jump, MD;  Location: [facility] OR;  Service: Open Heart Surgery;  Laterality: N/A;   EP IMPLANTABLE DEVICE N/A 07/25/2016  Procedure: BiV ICD Insertion CRT-D;  Surgeon: Will Cortland Ding, MD;  Location: [facility] INVASIVE CV LAB;  Service: Cardiovascular;  Laterality: N/A;   INGUINAL HERNIA REPAIR Left 08/20/2017   INGUINAL HERNIA REPAIR Left 08/20/2017   Procedure: OPEN LEFT INGUINAL HERNIA REPAIR;  Surgeon: Juanita Norlander, MD;  Location: [facility] OR;  Service: General;  Laterality: Left;   LEFT HEART CATH AND CORS/GRAFTS ANGIOGRAPHY N/A 12/18/2016   Procedure: Left Heart Cath and Cors/Grafts Angiography;  Surgeon: Arnoldo Lapping, MD;  Location: [facility] INVASIVE CV  LAB;  Service: Cardiovascular;  Laterality: N/A;   LEFT HEART CATH AND CORS/GRAFTS ANGIOGRAPHY N/A 08/22/2017   Procedure: LEFT HEART CATH AND CORS/GRAFTS ANGIOGRAPHY;  Surgeon: Darlis Eisenmenger, MD;  Location: [facility] INVASIVE CV LAB;  Service: Cardiovascular;  Laterality: N/A;   MITRAL VALVE REPLACEMENT N/A 07/11/2016   Procedure: MITRAL VALVE (MV) REPLACEMENT;  Surgeon: Gardenia Jump, MD;  Location: [facility] OR;  Service: Open Heart Surgery;  Laterality: N/A;   MYOCARDICAL PERFUSION  10/09/2007   NORMAL PERFUSION IN ALL REGIONS;NO EVIDENCE OF INDUCIBLE ISCHEMIA;POST STRESS EF% 66   RENAL ARTERY STENT Right 2007   RENAL DOPPLER  03/28/2010   RIGHT RA-NORMAL;LEFT PROXIMAL RA AT STENT-PATENT WITH NO EVIDENCE OF SIGN DIAMETER REDUCTION. R & L KIDNEYS: EQUAL IN SIZE,SYMMETRICAL IN SHAPE.   RIGHT HEART CATH N/A 01/08/2020   Procedure: RIGHT HEART CATH;  Surgeon: Darlis Eisenmenger, MD;  Location: [facility] INVASIVE CV LAB;  Service: Cardiovascular;  Laterality: N/A;   TEE WITHOUT CARDIOVERSION N/A 06/15/2016   Procedure: TRANSESOPHAGEAL ECHOCARDIOGRAM (TEE);  Surgeon: Luana Rumple, MD;  Location: [facility] ENDOSCOPY;  Service: Cardiovascular;  Laterality: N/A;   TEE WITHOUT CARDIOVERSION N/A 07/11/2016   Procedure: TRANSESOPHAGEAL ECHOCARDIOGRAM (TEE);  Surgeon: Gardenia Jump, MD;  Location: [facility] OR;  Service: Open Heart Surgery;  Laterality: N/A;   TEE WITHOUT CARDIOVERSION N/A 07/19/2016   Procedure: TRANSESOPHAGEAL ECHOCARDIOGRAM (TEE);  Surgeon: Lenise Quince, MD;  Location: [facility] ENDOSCOPY;  Service: Cardiovascular;  Laterality: N/A;   TRANSESOPHAGEAL ECHOCARDIOGRAM  10/19/2005   NORMAL LV; MILD TO MODERATE AMOUNT OF SOFT ATHEROMATOUS PLAQUE OF THE THORACIC AORTA; THE LEFT ATRIUM IS MILDLY DILATED;LEFT ATRIAL APPENDAGE FUNCTION IS NORMAL;NO THROMBUS IDENTIFIED. SMALL PFO WITH RIGHT TO LEFT SHUNT   HPI:  ZEBULAN HINSHAW is a 78 y.o. male presenting with hemoptysis. Pt with neck mass which is likely oropharyngeal ca. Seen by  ENT for laryngoscopy/biopsy with results pending and plan for tumor board discussion.  Soft tissue neck CT: "1. 3.7 cm mass centered at the right palatine tonsil/pharynx and  extending inferiorly to the glossotonsillar sulcus. The mass likely  also involves the soft palate. This most consistent with a  mucosal/submucosal neoplasm (likely squamous cell carcinoma).  Resultant mild-to-moderate effacement of the oropharyngeal airway.  ENT referral and direct visualization recommended.  2. 6.5 x 4.0 cm predominantly low attenuation lesion centered at the  right level 2 station, concerning for a cystic/necrotic lymph node (from nodal metastatic disease) given findings described in  impression #1.  3. Nonspecific 5 mm cystic-appearing lesion within the vallecula on  the right. Direct visualization recommended."  CXR 5/19 with clear lungs. Pt with significant cardiac history including CAD CHF AICD placement, status post CABG times 12/2005, redo CABG in 2017, history of A-fib, history of type 2 diabetes, on Eliquis  and Plavix . Seen by this service in 2017 s/p cardiac surgery, advanced to reg/thin diet.    Assessment / Plan / Recommendation  Clinical Impression  Pt is an extremely  pleasant, but very hungry 77 yo male who presents with grossly normal swallow function as assessed clinically.  Pt tolerated all consistencies trialed, including large serial straw sip of thin liquid >3oz at once, without any clinical s/s of aspiration and exhibited good oral clearance of solids.  On OME neoplasm visible in oropharynx.  Good lingual strength and ROM with ENT laryngoscopy not indicating BOT involvement.  Pt with clear CXR and has been tolerating regular texture diet without difficulty or hx pna.  Pt denies odynophagia.  Neck is not tender to palpation.  Discussed possible changes to swallow function during treatment for presumed oropharyngeal ca and likely need to follow up with SLP during/after treatment. SLP to follow for  tolerance/education while in house.    Recommend regular texture diet with thin liquids.   SLP Visit Diagnosis: Dysphagia, oropharyngeal phase (R13.12)    Aspiration Risk  No limitations    Diet Recommendation Regular;Thin liquid    Liquid Administration via: Cup;Straw Medication Administration: Whole meds with liquid Supervision: Patient able to self feed Compensations: Slow rate;Small sips/bites Postural Changes: Seated upright at 90 degrees    Other  Recommendations Oral Care Recommendations: Oral care BID    Recommendations for follow up therapy are one component of a multi-disciplinary discharge planning process, led by the attending physician.  Recommendations may be updated based on patient status, additional functional criteria and insurance authorization.  Follow up Recommendations Outpatient SLP      Assistance Recommended at Discharge  N/A  Functional Status Assessment Patient has had a recent decline in their functional status and demonstrates the ability to make significant improvements in function in a reasonable and predictable amount of time.  Frequency and Duration min 2x/week  2 weeks       Prognosis Prognosis for improved oropharyngeal function: Good      Swallow Study   General Date of Onset: 02/18/24 HPI: Christian Bowman is a 78 y.o. male presenting with hemoptysis. Pt with neck mass which is likely oropharyngeal ca. Seen by ENT for laryngoscopy/biopsy with results pending and plan for tumor board discussion.  Soft tissue neck CT: "1. 3.7 cm mass centered at the right palatine tonsil/pharynx and  extending inferiorly to the glossotonsillar sulcus. The mass likely  also involves the soft palate. This most consistent with a  mucosal/submucosal neoplasm (likely squamous cell carcinoma).  Resultant mild-to-moderate effacement of the oropharyngeal airway.  ENT referral and direct visualization recommended.  2. 6.5 x 4.0 cm predominantly low attenuation lesion  centered at the  right level 2 station, concerning for a cystic/necrotic lymph node (from nodal metastatic disease) given findings described in  impression #1.  3. Nonspecific 5 mm cystic-appearing lesion within the vallecula on  the right. Direct visualization recommended."  CXR 5/19 with clear lungs. Pt with significant cardiac history including CAD CHF AICD placement, status post CABG times 12/2005, redo CABG in 2017, history of A-fib, history of type 2 diabetes, on Eliquis  and Plavix . Seen by this service in 2017 s/p cardiac surgery, advanced to reg/thin diet. Type of Study: Bedside Swallow Evaluation Previous Swallow Assessment: 2017 s/p cabg, advanced to reg/thin Diet Prior to this Study: NPO Temperature Spikes Noted: No Respiratory Status: Room air History of Recent Intubation: No Behavior/Cognition: Alert;Cooperative;Pleasant mood Oral Cavity Assessment: Within Functional Limits Oral Care Completed by SLP: No Oral Cavity - Dentition: Adequate natural dentition Patient Positioning: Upright in bed Baseline Vocal Quality: Normal Volitional Cough: Strong Volitional Swallow: Able to elicit    Oral/Motor/Sensory Function  Overall Oral Motor/Sensory Function: Within functional limits Facial ROM: Within Functional Limits Facial Symmetry: Within Functional Limits Lingual ROM: Within Functional Limits Lingual Symmetry: Within Functional Limits Lingual Strength: Within Functional Limits Lingual Sensation: Within Functional Limits Velum: Within Functional Limits Mandible: Within Functional Limits   Ice Chips Ice chips: Not tested   Thin Liquid Thin Liquid: Within functional limits Presentation: Straw    Nectar Thick Nectar Thick Liquid: Not tested   Honey Thick Honey Thick Liquid: Not tested   Puree Puree: Within functional limits Presentation: Spoon   Solid     Solid: Within functional limits Presentation: Self Fed      Elester Grim, MA, CCC-SLP Acute Rehabilitation  Services Office: [phone] 02/19/2024,10:11 AM

## 2024-02-19 NOTE — Hospital Course (Addendum)
 Christian Bowman is a 78 y.o.male with a history of CKD, T2DM, AICD, CAD s/p CABG x 4, CHF, HTN, HLD, hypothyroid who was admitted to the [facility] Medicine Teaching Service at [facility] for hemoptysis.   His hospital course is detailed below:  Right neck mass Patient initially presented to the ED with acute on chronic hemoptysis.  He was hemodynamically stable and without compromised airway.  CT neck did demonstrate potential malignancy with extensive lymph node involvement.  Patient was admitted and ENT saw the patient and biopsied the mass.  Patient did have some pain which was managed accordingly, and he remained stable.  Plavix  was held due to bleeding concerns.  Discussed risks/benefits of anticoagulation with Plavix  or Eliquis ; patient was understanding of the risk-benefit analysis, and wishes to continue Eliquis .  Discharged with plan to follow-up with ENT in the outpatient setting for review of the biopsy results.   Other chronic conditions were medically managed with home medications and formulary alternatives as necessary (T2DM, hypothyroid, HLD, CHF)   PCP Follow-up Recommendations: Needs Cardiology follow up Needs ENT follow up to review biopsy results Check BMP. Consider adjusting Lokelma  if K low.

## 2024-02-19 NOTE — Assessment & Plan Note (Deleted)
 No hemoptysis overnight. Hemodynamically stable with uncompromised airway.  ENT evaluated yesterday and took biopsy; reported plan to discuss with tumor board this week. - ENT consulted, recs appreciated - Hold Eliquis , Plavix  - AM CBC

## 2024-02-19 NOTE — Progress Notes (Signed)
 AVS reviewed with patient. Patient doesn't have any TOC meds or medications to pick-up from the pharmacy. Patient informed of follow-up appointments. Patient verbalized understanding of discharge instructions. IV removed, site clean and dry. All questions were answered. Patient states all belongings brought to the hospital on admission are accounted for and packed to take home. Patient transported to entrance A for pick-up for discharge.

## 2024-02-19 NOTE — Care Management Obs Status (Signed)
 MEDICARE OBSERVATION STATUS NOTIFICATION   Patient Details  Name: UTAH DELAUDER MRN: [id number] Date of Birth: 24-Jul-1946   Medicare Observation Status Notification Given:  Yes Moon/Obs letter signed and copy given   Wynonia Hedges 02/19/2024, 9:27 AM

## 2024-02-19 NOTE — Discharge Summary (Addendum)
 Family Medicine Teaching [facility] Discharge Summary  Patient name: Christian Bowman Medical record number: [id number] Date of birth: 1946-08-03 Age: 78 y.o. Gender: male Date of Admission: 02/18/2024  Date of Discharge: 02/19/24 Admitting Physician: Ivin Marrow, MD  Primary Care Provider: Luana Rumple, MD Consultants: ENT  Indication for Hospitalization: Hemoptysis  Brief Hospital Course:  Christian Bowman is a 78 y.o.male with a history of CKD, T2DM, AICD, CAD s/p CABG x 4, CHF, HTN, HLD, hypothyroid who was admitted to the [facility] Medicine Teaching Service at [facility] for hemoptysis.   His hospital course is detailed below:  Right neck mass Patient initially presented to the ED with acute on chronic hemoptysis.  He was hemodynamically stable and without compromised airway.  CT neck did demonstrate potential malignancy with extensive lymph node involvement.  Patient was admitted and ENT saw the patient and biopsied the mass.  Patient did have some pain which was managed accordingly, and he remained stable.  Plavix  was held due to bleeding concerns.  Discussed risks/benefits of anticoagulation with Plavix  or Eliquis ; patient was understanding of the risk-benefit analysis, and wishes to continue Eliquis .  Discharged with plan to follow-up with ENT in the outpatient setting for review of the biopsy results.   Other chronic conditions were medically managed with home medications and formulary alternatives as necessary (T2DM, hypothyroid, HLD, CHF)   PCP Follow-up Recommendations: Needs Cardiology follow up Needs ENT follow up to review biopsy results Check BMP. Consider adjusting Lokelma  if K low.    Discharge Diagnoses/Problem List:  Principal Problem:   Mass in neck Active Problems:   Type 2 diabetes mellitus with stage 3a chronic kidney disease, without long-term current use of insulin  (HCC)   Chronic CHF (congestive heart failure) (HCC)   Chronic health problem   Hemoptysis    Tonsillar mass  Disposition: home  Discharge Condition: stable  Discharge Exam:  General: Sitting up in bed comfortably, NAD HEENT: 5-6 cm mass on the right neck/submandibular, nontender, mobile. Cardiovascular: RRR Respiratory: CTA bilaterally.  Normal work of breathing on room air. Extremities: Moves all equally.  Significant Procedures: none  Significant Labs and Imaging:  Recent Labs  Lab 02/18/24 0350 02/19/24 0711  WBC 10.8* 7.2  HGB 16.0 15.4  HCT 48.9 46.9  PLT 140* 121*   Recent Labs  Lab 02/18/24 0515 02/19/24 0711  NA 136 139  K 4.2 4.4  CL 105 107  CO2 21* 25  GLUCOSE 161* 102*  BUN 34* 30*  CREATININE 1.59* 1.75*  CALCIUM  9.0 8.9  MG  --  2.1  ALKPHOS 48  --   AST 20  --   ALT 19  --   ALBUMIN  3.3*  --     CT Soft Tissue Neck 1. 3.7 cm mass centered at the right palatine tonsil/pharynx and extending inferiorly to the glossotonsillar sulcus. The mass likely also involves the soft palate. This most consistent with a mucosal/submucosal neoplasm (likely squamous cell carcinoma). Resultant mild-to-moderate effacement of the oropharyngeal airway. ENT referral and direct visualization recommended. 2. 6.5 x 4.0 cm predominantly low attenuation lesion centered at the right level 2 station, concerning for a cystic/necrotic lymph node (from nodal metastatic disease) given findings described in impression #1. 3. Nonspecific 5 mm cystic-appearing lesion within the vallecula on the right. Direct visualization recommended. 4. Partially imaged forehead lipoma, measuring at least 12 x 5 mm. 5. Mild T1 superior endplate vertebral compression deformity, new from the prior chest CT of 11/01/2022 but otherwise age-indeterminate.  CT Chest 1. No acute findings within the chest. No explanation for patient's hemoptysis within the chest. 2. Mild diffuse bronchial wall thickening is nonspecific, but can be seen in the setting of bronchitis. 3. New 5 mm nodule  within the posterior and medial left lower lobe. If the patient is at high risk for bronchogenic carcinoma, follow-up chest CT at 1year is recommended. If the patient is at low risk, no follow-up is needed. This recommendation follows the consensus statement: Guidelines for Management of Small Pulmonary Nodules Detected on CT Scans: A Statement from the Fleischner Society as published in Radiology 2005; 237:395-400. 4.  Aortic Atherosclerosis (ICD10-I70.0).  Results/Tests Pending at Time of Discharge:  - Neck mass biopsy specimen collected by ENT  Discharge Medications:  Allergies as of 02/19/2024       Reactions   Xanax [alprazolam] Other (See Comments)   Pt feels very weak, tired and feels paralyzed          Medication List     STOP taking these medications    clopidogrel  75 MG tablet Commonly known as: PLAVIX        TAKE these medications    Accu-Chek Guide Me w/Device Kit 1 Piece by Does not apply route as directed.   Accu-Chek Guide test strip Generic drug: glucose blood Use  to monitor glucose once a day at fasting.   acetaminophen  325 MG tablet Commonly known as: TYLENOL  Take 325 mg by mouth every 6 (six) hours as needed for mild pain (pain score 1-3).   apixaban  5 MG Tabs tablet Commonly known as: ELIQUIS  Take 1 tablet (5 mg total) by mouth 2 (two) times daily.   carvedilol  12.5 MG tablet Commonly known as: COREG  TAKE 1 & 1/2 (ONE & ONE-HALF) TABLETS BY MOUTH TWICE DAILY WITH A MEAL   dapagliflozin  propanediol 10 MG Tabs tablet Commonly known as: FARXIGA  Take 1 tablet (10 mg total) by mouth daily.   digoxin  0.125 MG tablet Commonly known as: LANOXIN  Take 1/2 (one-half) tablet by mouth once daily   Entresto  97-103 MG Generic drug: sacubitril -valsartan  Take 1 tablet by mouth twice daily   eplerenone  50 MG tablet Commonly known as: INSPRA  Take 1 tablet (50 mg total) by mouth daily.   furosemide  20 MG tablet Commonly known as: LASIX  Take 1  tablet (20 mg total) by mouth daily as needed (for a weight over 190 lbs).   glipiZIDE  2.5 MG 24 hr tablet Commonly known as: GLUCOTROL  XL Take 1 tablet (2.5 mg total) by mouth daily with breakfast.   levothyroxine  75 MCG tablet Commonly known as: SYNTHROID  Take 0.5 tablets (37.5 mcg total) by mouth daily before breakfast.   Lokelma  10 g Pack packet Generic drug: sodium zirconium cyclosilicate  DISSOLVE 1 PACKET IN WATER & DRINK ONCE DAILY   MiraLax  17 g packet Generic drug: polyethylene glycol Take 17 g by mouth daily as needed for mild constipation or moderate constipation.   neomycin-bacitracin-polymyxin 400-01-4999 ointment Commonly known as: NEOSPORIN Apply 1 application  topically daily as needed for wound care.   nitroGLYCERIN  0.4 MG SL tablet Commonly known as: NITROSTAT  DISSOLVE 1 TABLET UNDER THE TONGUE EVERY 5 MINUTES AS NEEDED FOR CHEST PAIN, DO NOT EXCEED 3 DOSES IN 15 MINUTES   rosuvastatin  10 MG tablet Commonly known as: CRESTOR  Take 1 tablet (10 mg total) by mouth daily.   VISINE OP Place 1 drop into both eyes daily as needed (irritation).   Vitamin D  50 MCG (2000 UT) tablet Take 2,000 Units by mouth  daily with breakfast.        Discharge Instructions: Please refer to Patient Instructions section of EMR for full details.  Patient was counseled important signs and symptoms that should prompt return to medical care, changes in medications, dietary instructions, activity restrictions, and follow up appointments.   Follow-Up Appointments:  Future Appointments  Date Time Provider Department Center  02/20/2024 10:20 AM Darlis Eisenmenger, MD [facility] None  03/10/2024  7:20 AM CVD HVT DEVICE REMOTES [facility] H&V  03/13/2024  9:30 AM Baby Bolt, MD [facility] None  04/22/2024  8:10 AM CVD HVT DEVICE REMOTES [facility] H&V  05/23/2024  9:40 AM Croitoru, Karyl Paget, MD [facility] H&V  07/22/2024  8:10 AM CVD HVT DEVICE REMOTES [facility] H&V    Albin Huh,  MD 02/19/2024, 3:05 PM PGY-1, [facility]

## 2024-02-20 ENCOUNTER — Ambulatory Visit (HOSPITAL_COMMUNITY)
Admission: RE | Admit: 2024-02-20 | Discharge: 2024-02-20 | Disposition: A | Discharge status: Home / Self Care | Source: Ambulatory Visit | Attending: Cardiology | Admitting: Cardiology

## 2024-02-20 ENCOUNTER — Encounter (HOSPITAL_COMMUNITY): Payer: Self-pay | Admitting: Cardiology

## 2024-02-20 ENCOUNTER — Other Ambulatory Visit: Payer: Self-pay

## 2024-02-20 VITALS — BP 126/74 | HR 76 | Ht 72.0 in | Wt 191.6 lb

## 2024-02-20 DIAGNOSIS — E1122 Type 2 diabetes mellitus with diabetic chronic kidney disease: Secondary | ICD-10-CM | POA: Diagnosis not present

## 2024-02-20 DIAGNOSIS — Z951 Presence of aortocoronary bypass graft: Secondary | ICD-10-CM | POA: Diagnosis not present

## 2024-02-20 DIAGNOSIS — Z7902 Long term (current) use of antithrombotics/antiplatelets: Secondary | ICD-10-CM | POA: Diagnosis not present

## 2024-02-20 DIAGNOSIS — I480 Paroxysmal atrial fibrillation: Secondary | ICD-10-CM | POA: Diagnosis not present

## 2024-02-20 DIAGNOSIS — I5042 Chronic combined systolic (congestive) and diastolic (congestive) heart failure: Secondary | ICD-10-CM | POA: Diagnosis not present

## 2024-02-20 DIAGNOSIS — Z8249 Family history of ischemic heart disease and other diseases of the circulatory system: Secondary | ICD-10-CM | POA: Insufficient documentation

## 2024-02-20 DIAGNOSIS — Z953 Presence of xenogenic heart valve: Secondary | ICD-10-CM | POA: Insufficient documentation

## 2024-02-20 DIAGNOSIS — R590 Localized enlarged lymph nodes: Secondary | ICD-10-CM | POA: Diagnosis not present

## 2024-02-20 DIAGNOSIS — I2510 Atherosclerotic heart disease of native coronary artery without angina pectoris: Secondary | ICD-10-CM | POA: Insufficient documentation

## 2024-02-20 DIAGNOSIS — R008 Other abnormalities of heart beat: Secondary | ICD-10-CM | POA: Insufficient documentation

## 2024-02-20 DIAGNOSIS — Z79899 Other long term (current) drug therapy: Secondary | ICD-10-CM | POA: Insufficient documentation

## 2024-02-20 DIAGNOSIS — Z7901 Long term (current) use of anticoagulants: Secondary | ICD-10-CM | POA: Insufficient documentation

## 2024-02-20 DIAGNOSIS — I5022 Chronic systolic (congestive) heart failure: Secondary | ICD-10-CM | POA: Insufficient documentation

## 2024-02-20 DIAGNOSIS — E875 Hyperkalemia: Secondary | ICD-10-CM | POA: Insufficient documentation

## 2024-02-20 DIAGNOSIS — Z95810 Presence of automatic (implantable) cardiac defibrillator: Secondary | ICD-10-CM | POA: Insufficient documentation

## 2024-02-20 DIAGNOSIS — Z8774 Personal history of (corrected) congenital malformations of heart and circulatory system: Secondary | ICD-10-CM | POA: Diagnosis not present

## 2024-02-20 DIAGNOSIS — Z8673 Personal history of transient ischemic attack (TIA), and cerebral infarction without residual deficits: Secondary | ICD-10-CM | POA: Diagnosis not present

## 2024-02-20 DIAGNOSIS — N183 Chronic kidney disease, stage 3 unspecified: Secondary | ICD-10-CM | POA: Insufficient documentation

## 2024-02-20 DIAGNOSIS — I255 Ischemic cardiomyopathy: Secondary | ICD-10-CM | POA: Diagnosis not present

## 2024-02-20 DIAGNOSIS — Z7984 Long term (current) use of oral hypoglycemic drugs: Secondary | ICD-10-CM | POA: Insufficient documentation

## 2024-02-20 DIAGNOSIS — I252 Old myocardial infarction: Secondary | ICD-10-CM | POA: Diagnosis not present

## 2024-02-20 DIAGNOSIS — I442 Atrioventricular block, complete: Secondary | ICD-10-CM | POA: Insufficient documentation

## 2024-02-20 DIAGNOSIS — R9431 Abnormal electrocardiogram [ECG] [EKG]: Secondary | ICD-10-CM | POA: Diagnosis not present

## 2024-02-20 DIAGNOSIS — I493 Ventricular premature depolarization: Secondary | ICD-10-CM | POA: Diagnosis not present

## 2024-02-20 MED ORDER — CARVEDILOL 12.5 MG PO TABS: ORAL_TABLET | ORAL | 3 refills | Status: DC

## 2024-02-20 NOTE — Patient Instructions (Signed)
 STOP Plavix .  Your physician recommends that you schedule a follow-up appointment in: 6 months  (November) ** PLEASE CALL THE OFFICE IN [place] TO ARRANGE YOUR FOLLOW UP APPOINTMENT.**  If you have any questions or concerns before your next appointment please send us  a message through [place] or call our office at [phone].    TO LEAVE A MESSAGE FOR THE NURSE SELECT OPTION 2, PLEASE LEAVE A MESSAGE INCLUDING: YOUR NAME DATE OF BIRTH CALL BACK NUMBER REASON FOR CALL**this is important as we prioritize the call backs  YOU WILL RECEIVE A CALL BACK THE SAME DAY AS LONG AS YOU CALL BEFORE 4:00 PM  At the [facility], you and your health needs are our priority. As part of our continuing mission to provide you with exceptional heart care, we have created designated Provider Care Teams. These Care Teams include your primary Cardiologist (physician) and Advanced Practice Providers (APPs- Physician Assistants and Nurse Practitioners) who all work together to provide you with the care you need, when you need it.   You may see any of the following providers on your designated Care Team at your next follow up: Dr Jules Oar Dr Peder Bourdon Dr. Alwin Baars Dr. Arta Lark [name] Marijane Shoulders, NP Ruddy Corral, [place] [facility] [place], [place] Dennise Fitz, NP [place] Lee, NP [place] Dellen, NP Luster Salters, PharmD Bevely Brush, PharmD   Please be sure to bring in all your medications bottles to every appointment.    Thank you for choosing [facility]

## 2024-02-20 NOTE — Progress Notes (Signed)
 Date:  02/20/2024   ID:  RAINN Bowman, DOB 1946-02-11, MRN [id number]   Provider location: [facility] Type of Visit: Established patient   PCP:  Christian Rumple, MD  Cardiologist: Dr. Alvis Ba  HF Cardiologist:  Peder Bourdon, MD   History of Present Illness: Christian Bowman is a 78 y.o. male with history of CAD s/p CABG in 2007 then redo CABG in 10/17 with mitral valve replacement.  He was admitted in 10/17 for redo CABG with SVG-PDA and SVG-ramus.  He also had mitral valve replacement with a bioprosthetic valve because of infarct-related mitral regurgitation.  Post-operative course was complicated by CHF requiring diuresis.  He also had atrial flutter and required DCCV.  Due to complete heart block, he later got a CRT-D system.     At a prior visit, he was in atrial flutter.  He saw Dr. Lawana Pray, it was decided to arrange for DCCV with ablation down the road when PPM leads have been in longer.  He had successful DCCV in 12/17 and is in NSR today.    He was admitted in 3/18 with NSTEMI and chest pain. TnI only 0.5.  LHC showed occlusion of SVG-PDA from CABG#1 to be the likely culprit.  However, SVG-PDA from CABG#2 was patent.  No intervention.  Echo in 3/18 from [place] showed EF 30-35%, stable bioprosthetic mitral valve.    He had atrial flutter ablation in 8/18.  He is in NSR today and is off amiodarone .     In 11/18, he had an inguinal hernia repair. Post-op, he had an NSTEMI.  LHC showed occlusion of a PLV branch that had been backfilled by SVG-PDA (prior cath had shown severe diffuse disease in the PLV).  Echo in 11/18 showed EF 20-25% with normal bioprosthetic mitral valve.     With no recurrence of atrial arrhythmias, he is now off anticoagulation.   Echo in 7/20 showed EF 25-30%, bioprosthetic MV with no MR and mean gradient 5 mmHg, normal RV.    In 1/21, he developed right hand weakness and dysarthria concerning for CVA.  He went to the ER in [place] and  had tPA.  Symptoms resolved.  Atrial fibrillation was not noted per his wife's report.  CTA head/neck showed no significant carotid stenosis.  He was started on Plavix  75 mg daily and sent home.    CPX (3/21) showed severe functional impairment due to HF.  Echo showed EF 20-25%, normal RV, normally functioning bioprosthetic mitral valve.  RHC in 4/21 showed low filling pressures and low cardiac index (2.1 Fick, 1.9 thermo).  He saw Dr. Neila Bally to discuss possible lateral thoracotomy access LVAD. He decided against LVAD placement.   Echo in 8/23 showed EF < 20%, mild LV dilation, mildly decreased RV systolic function, PASP 37 mmHg, bioprosthetic mitral valve with mean gradient 5 mmHg and no MR, IVC normal.   Zio monitor in 9/23 showed 8.5% PVCs.  With CRT percentage < 90%, we discussed mexiletine to suppress PVCs but he decided against this.    Echo in 9/24 showed EF < 20% with global hypokinesis, no LV thrombus, moderate RV dysfunction with normal RV size, normal bioprosthetic mitral valve with mean gradient 5 mmHg.    Patient was started on apixaban  with atrial fibrillation noted on device interrogation, ASA was stopped.   In 5/25, patient was admitted to [facility] with hemoptysis.  Workup by ENT showed a tonsillar mass with cervical lymphadenopathy concerning for  cancer.  The tonsillar mass was biopsied, pending result.  Patient returns for followup of CHF.  He is anxious about his recent cancer diagnosis and waiting for a call after his case is discussed by the tumor board.  Stable/unchanged dyspnea with moderate exertion.  No orthopnea/PND.  He has had no further hemoptysis.  No chest pain recently.  Poor appetite and generalized fatigue.  Weight is stable.   ECG (personally reviewed): A-BiV pacing   Labs (11/17): K 4.8, creatinine 1.89, hgb 12.3, digoxin  0.9, LFTs normal, TSH 8.235 (mild increase), free T3 low, free T4 normal, LFTs normal.  Labs (12/17): K 4.5, creatinine 1.7, BNP 3909 Labs  (3/18): K 3.9, creatinine 1.48, hgb 11.3 Labs (4/18): digoxin  0.5, LFTs normal Labs (7/18): K 5.1, creatinine 1.78, LDL 63, HDL 29, TSH elevated Labs (11/18): K 4.5, creatinine 1.86, hgb 13.6 Labs (12/18): K 5, creatinine 1.88 Labs (1/19): digoxin  0.3, K 4.7, creatinine 1.83 Labs (6/19): LDL 70, HDL 32, K 5.1, creatinine 4.09 Labs (12/19): LDL 60, HDL 31 Labs (1/20): K 5.1, creatinine 1.64 Labs (4/20): K 4.5, creatinine 1.77 Labs (8/20): K 4.6, creatinine 1.5 Labs (1/21): K 4.5, creatinine 1.58, LDL 69 Labs (2/21): K 4.5, creatinine 1.66 Labs (4/21): K 4.1, creatinine 1.63, digoxin  0.4 Labs (5/21): K 4.4, creatinine 1.51 Labs (6/21): K 4.4, creatinine 1.51, digoxin  0.3 Labs (12/21): K 4.9, creatinine 1.69 Labs (5/22): K 4.5, creatinine 1.48 Labs (10/22): LDL 71 Labs (1/23): K 4.5, creatinine 1.49 Labs (7/23): K 4, creatinine 1.59 Labs (10/23): K 4.5, creatinine 1.43, LFTs normal Labs (5/24): K 4.8, creatinine 1.7, TSH normal Labs (5/25): K 4.4, creatinine 1.75, TSH normal, hgb 15.4   PMH: 1. CAD: CABG 2007.  - LHC (9/17) with patent LIMA-LAD, totally occluded SVG-D, severe stenosis in SVG-PDA.  - Redo CABG 10/17 with SVG-PDA, SVG-ramus and mitral valve replacement.  - NSTEMI 3/18.  LHC with culprit lesion likely occlusion of SVG-PDA from CABG#1.  Patent SVG-PDA from CABG#2.  No intervention.  - NSTEMI 11/18 post-op inguinal hernia repair.  LHC with occlusion of a PLV branch that had been backfilled by SVG-PDA (prior cath had shown severe diffuse disease in the PLV).  2. Mitral regurgitation: Ischemic MR, mitral valve replacement was done in 10/17 (bioprosthetic). 3. Complete heart block: Post-op in 10/17.  Medtronic CRT-D placed.  4. Atrial flutter: DCCV 10/17 and again in 12/17.  - Ablation 8/18, now off amiodarone .  5. Type II diabetes 6. Hyperlipidemia 7. Chronic systolic CHF: Ischemic cardiomyopathy.  Medtronic CRT-D.  - TEE (10/17): EF 20-25%, severe LV dilation - Echo  (3/18, [place]): EF 30-35%, bioprosthetic mitral valve with mean gradient 5 mmHg.  - Echo (11/18): EF 20-25%, moderate LV dilation, normal bioprosthetic mitral valve.  - Echo (7/20): EF 25-30%, moderate LV dilation, inferior/inferolateral AK, bioprosthetic MV with mean gradient 5 and no MR, normal RV.  - Painful gynecomastia with spironolactone .  - Echo (1/21): EF < 20%, normal RV size and systolic function, bioprosthetic mitral valve with mild MR, mean gradient 2 mmHg.  - CPX (3/21): peak VO2 11.2, VE/VCO2 slope 56, RER 1.21.  - Echo (3/21): EF 20-25%, no LV thrombus, RV normal, bioprosthetic mitral valve appear normal.  - RHC (4/21): mean RA 3, PA 25/7, mean PCWP 11, CI 2.1 (Fick), 1.9 (thermo).  - Echo (8/23): EF < 20%, mild LV dilation, mildly decreased RV systolic function, PASP 37 mmHg, bioprosthetic mitral valve with mean gradient 5 mmHg and no MR, IVC normal.  - Echo (9/24): EF <  20% with global hypokinesis, no LV thrombus, moderate RV dysfunction with normal RV size, normal bioprosthetic mitral valve with mean gradient 5 mmHg.   8. Hypothyroidism 9. CVA 1/21: CTA head/neck with no carotid stenosis.  10. PVCs: Zio monitor in 9/23 showed 8.5% PVCs 11. Atrial fibrillation: Paroxysmal.  12. 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  Has cervical lymphadenopathy.   Current Outpatient Medications  Medication Sig Dispense Refill   acetaminophen  (TYLENOL ) 325 MG tablet Take 325 mg by mouth every 6 (six) hours as needed for mild pain (pain score 1-3).     apixaban  (ELIQUIS ) 5 MG TABS tablet Take 1 tablet (5 mg total) by mouth 2 (two) times daily. 60 tablet 6   Blood Glucose Monitoring Suppl (ACCU-CHEK GUIDE ME) w/Device KIT 1 Piece by Does not apply route as directed. 1 kit 0   Cholecalciferol  (VITAMIN D ) 50 MCG (2000 UT) tablet Take 2,000 Units by mouth daily with breakfast.     dapagliflozin  propanediol (FARXIGA ) 10 MG TABS tablet Take 1 tablet (10 mg total) by mouth daily. 90 tablet  3   digoxin  (LANOXIN ) 0.125 MG tablet Take 1/2 (one-half) tablet by mouth once daily 45 tablet 3   ENTRESTO  97-103 MG Take 1 tablet by mouth twice daily 180 tablet 0   eplerenone  (INSPRA ) 50 MG tablet Take 1 tablet (50 mg total) by mouth daily. 90 tablet 3   furosemide  (LASIX ) 20 MG tablet Take 1 tablet (20 mg total) by mouth daily as needed (for a weight over 190 lbs). 30 tablet 11   glipiZIDE  (GLUCOTROL  XL) 2.5 MG 24 hr tablet Take 1 tablet (2.5 mg total) by mouth daily with breakfast. 90 tablet 1   glucose blood (ACCU-CHEK GUIDE) test strip Use  to monitor glucose once a day at fasting. 100 each 2   levothyroxine  (SYNTHROID ) 75 MCG tablet Take 0.5 tablets (37.5 mcg total) by mouth daily before breakfast.     neomycin-bacitracin-polymyxin (NEOSPORIN) ointment Apply 1 application  topically daily as needed for wound care.     nitroGLYCERIN  (NITROSTAT ) 0.4 MG SL tablet DISSOLVE 1 TABLET UNDER THE TONGUE EVERY 5 MINUTES AS NEEDED FOR CHEST PAIN, DO NOT EXCEED 3 DOSES IN 15 MINUTES 25 tablet 1   polyethylene glycol (MIRALAX ) 17 g packet Take 17 g by mouth daily as needed for mild constipation or moderate constipation.     rosuvastatin  (CRESTOR ) 10 MG tablet Take 1 tablet (10 mg total) by mouth daily. 90 tablet 3   sodium zirconium cyclosilicate  (LOKELMA ) 10 g PACK packet DISSOLVE 1 PACKET IN WATER & DRINK ONCE DAILY 90 each 3   Tetrahydrozoline HCl (VISINE OP) Place 1 drop into both eyes daily as needed (irritation).     carvedilol  (COREG ) 12.5 MG tablet TAKE 1 & 1/2 (ONE & ONE-HALF) TABLETS BY MOUTH TWICE DAILY WITH A MEAL 180 tablet 3   No current facility-administered medications for this encounter.    Allergies:   Xanax [alprazolam]   Social History:  The patient  reports that he has never smoked. He has never used smokeless tobacco. He reports that he does not drink alcohol and does not use drugs.   Family History:  The patient's family history includes Heart attack in his father and  mother; Heart disease in his father, maternal grandmother, and mother; Hypertension in his father and sister.   ROS:  Please see the history of present illness.   All other systems are personally reviewed and negative.   Exam:   BP 126/74  Pulse 76   Ht 6' (1.829 m)   Wt 86.9 kg (191 lb 9.6 oz)   SpO2 95%   BMI 25.99 kg/m   General: NAD Neck: No JVD, no thyromegaly or thyroid  nodule. Soft tissue mass right neck, chronic.  Lungs: Clear to auscultation bilaterally with normal respiratory effort. CV: Nondisplaced PMI.  Heart regular S1/S2, no S3/S4, no murmur.  No peripheral edema.  No carotid bruit.  Normal pedal pulses.  Abdomen: Soft, nontender, no hepatosplenomegaly, no distention.  Skin: Intact without lesions or rashes.  Neurologic: Alert and oriented x 3.  Psych: Normal affect. Extremities: No clubbing or cyanosis.  HEENT: Normal.   Recent Labs: 02/18/2024: ALT 19 02/19/2024: BUN 30; Creatinine, Ser 1.75; Hemoglobin 15.4; Magnesium  2.1; Platelets 121; Potassium 4.4; Sodium 139; TSH 3.081  Personally reviewed   Wt Readings from Last 3 Encounters:  02/20/24 86.9 kg (191 lb 9.6 oz)  02/18/24 90 kg (198 lb 6.6 oz)  12/26/23 89.9 kg (198 lb 3.2 oz)    ASSESSMENT AND PLAN:  1. Chronic systolic CHF: Ischemic cardiomyopathy.  Medtronic CRT-D.  TEE 10/17 with EF 20-25%.  Echo in 3/21 shoed EF 20-25%, normal RV function, normal bioprosthetic mitral valve.  CPX in 3/21 showed severe functional impairment due to HF with concern for poor short-term prognosis.  RHC in 4/21 showed normal filling pressures but low cardiac index. Last echo in 9/24 with EF < 20% with global hypokinesis, no LV thrombus, moderate RV dysfunction with normal RV size, normal bioprosthetic mitral valve with mean gradient 5 mmHg.  I have talked to him about advanced therapies given poor prognosis with low cardiac output. He is not a transplant candidate.  He has had 2 prior sternotomies, which makes LVAD more  complicated.  We discussed LVAD extensively.  He is very clear that he does not want another cardiac surgery.  NYHA class II-III symptoms, no change.  Not volume overloaded by exam.  - Continue to use Lasix  prn.    - Continue Entresto  97/103 bid.  - Gynecomastia with spironolactone .  Continue eplerenone  50 mg daily (goal dose).   - Continue Coreg  18.75 mg bid.  He did not tolerate increasing Coreg  to 25 mg bid due to dizziness.       - He has been on Lokelma  chronically to control hyperkalemia and allow use of eplerenone  and Entresto , continue.  BMET was stable from yesterday.  - Continue Farxiga  10 mg daily.    - Continue digoxin  0.0625 daily.  Check digoxin  level next labs.  - As he has turned down LVAD, we have discussed baroreceptor activation therapy in the past to help with his symptoms. His right neck mass/lipoma poses a problem for vagal lead placement, however. He would need the lipoma removed.   2. CAD: s/p redo CABG.  Admission in 3/18 with NSTEMI, LHC showed occlusion of SVG-PDA from CABG#1 but patent SVG-PDA from CABG#2, no intervention.  He had a post-op NSTEMI after inguinal hernia repair in 11/18.  LHC showed occlusion of a PLV branch that had been backfilled by SVG-PDA (prior cath had shown severe diffuse disease in the PLV). No recent chest pain.  When he gets CP, usually with emotional stress.       - Continue statin => Crestor  10 mg daily.  Check lipids with next labs.  - He is currently on Plavix  + Eliquis .  With no recent intervention and the need for Eliquis  use as well as recent hemoptysis originating from tonsillar mass, will stop  Plavix  but continue Eliquis .  3. Bioprosthetic mitral valve: Stable on 9/24 echo.   - Needs antibiotic prophylaxis with dental work.  4. Atrial flutter/fibrillation: s/p flutter ablation in 8/18.  In 4/25, runs of atrial fibrillation were seen by device interrogation and apixaban  was started. He is in NSR today.   - Continue apixaban  but as above,  will stop Plavix .  5. CKD: Stage III.  Creatinine was 1.75 yesterday, not far off baseline.   6. Type II diabetes: Followed by endocrinology. 7. CVA: In 1/21, got tPA at the ER in [place].  Symptoms resolved.  CTA head/neck showed no carotid stenosis.  He has now been started on apixaban  with AF detected on his device.  - Continue apixaban .  - Continue Crestor .  8. PVCs: None on ECG today.   9. Suspected tonsillar cancer: Mass on tonsils with cervical lymphadenopathy.  He never smoked or chewed tobacco.  Mass biopsied, pending result.  - Tumor board meeting today to discuss management. - Surgical management would be high risk with very low EF and prior h/o post-op NSTEMI.   Followup in 6 months (will see Dr. Alvis Ba in between visits with me).    I spent 31 minutes reviewing records, interviewing/examining patient, and managing orders.   Signed, Peder Bourdon, MD  02/20/2024  [facility] [address] [facility] [place] [address] [phone] (office) [phone] (fax)

## 2024-02-21 ENCOUNTER — Ambulatory Visit: Payer: Self-pay | Admitting: Cardiovascular Disease

## 2024-02-21 ENCOUNTER — Other Ambulatory Visit (INDEPENDENT_AMBULATORY_CARE_PROVIDER_SITE_OTHER): Payer: Self-pay

## 2024-02-21 DIAGNOSIS — C099 Malignant neoplasm of tonsil, unspecified: Secondary | ICD-10-CM

## 2024-02-21 LAB — SURGICAL PATHOLOGY

## 2024-02-22 ENCOUNTER — Other Ambulatory Visit: Payer: Self-pay

## 2024-02-22 ENCOUNTER — Other Ambulatory Visit (INDEPENDENT_AMBULATORY_CARE_PROVIDER_SITE_OTHER): Payer: Self-pay | Admitting: Otolaryngology

## 2024-02-22 ENCOUNTER — Telehealth (INDEPENDENT_AMBULATORY_CARE_PROVIDER_SITE_OTHER): Payer: Self-pay

## 2024-02-22 DIAGNOSIS — C099 Malignant neoplasm of tonsil, unspecified: Secondary | ICD-10-CM

## 2024-02-22 NOTE — Progress Notes (Signed)
 Oncology Nurse Navigator Documentation   Placed introductory call to new referral patient Christian Bowman. I spoke to his wife.  Introduced myself as the H&N oncology nurse navigator that works with Dr. Randye Buttner and Dr. Lurena Sally to whom he has been referred by Dr. Larkin Plumb. She confirmed understanding of referral. Briefly explained my role as his/ their navigator, provided my contact information.  Confirmed understanding that they would be receiving calls to be scheduled with medical and radiation oncology at [facility].    I encouraged her to call with questions/concerns as he moves forward with appts and procedures.   She verbalized understanding of information provided, expressed appreciation for my call.   Navigator Initial Assessment Employment Status: he's retired Currently on [employer] / STD: na Living Situation: he lives with his wife in [place]  Support System: wife, family PCP: Luana Rumple MD PCD: Financial Concerns: no Transportation Needs: no Sensory Deficits: no Chiropodist Needed:  no Ambulation Needs: no Psychosocial Needs:  no Concerns/Needs Understanding Cancer:  addressed/answered by navigator to best of ability Self-Expressed Needs: no   Tourist information centre manager, BSN, OCN Head & Neck Oncology Nurse Navigator [facility] Phone # [phone]  Fax # [phone]

## 2024-02-22 NOTE — Telephone Encounter (Signed)
Patient would need appt.  

## 2024-02-28 ENCOUNTER — Encounter (HOSPITAL_COMMUNITY)
Admission: RE | Admit: 2024-02-28 | Discharge: 2024-02-28 | Disposition: A | Discharge status: Home / Self Care | Source: Ambulatory Visit | Attending: Otolaryngology | Admitting: Otolaryngology

## 2024-02-28 DIAGNOSIS — C099 Malignant neoplasm of tonsil, unspecified: Secondary | ICD-10-CM | POA: Insufficient documentation

## 2024-02-28 MED ORDER — FLUDEOXYGLUCOSE F - 18 (FDG) INJECTION
9.8200 | Freq: Once | INTRAVENOUS | Status: AC | PRN
  Administered 2024-02-28: 9.82 via INTRAVENOUS

## 2024-02-29 LAB — T4, FREE: Free T4: 1.51 ng/dL (ref 0.82–1.77)

## 2024-02-29 LAB — LIPID PANEL
Chol/HDL Ratio: 3.5 ratio (ref 0.0–5.0)
Cholesterol, Total: 125 mg/dL (ref 100–199)
HDL: 36 mg/dL — ABNORMAL LOW (ref >39)
LDL Chol Calc (NIH): 70 mg/dL (ref 0–99)
Triglycerides: 104 mg/dL (ref 0–149)
VLDL Cholesterol Cal: 19 mg/dL (ref 5–40)

## 2024-02-29 LAB — TSH: TSH: 5.65 u[IU]/mL — ABNORMAL HIGH (ref 0.450–4.500)

## 2024-03-03 NOTE — Progress Notes (Signed)
 Radiation Oncology         [phone] ________________________________  Initial outpatient Consultation  Name: Christian Bowman MRN: [id number]  Date: 03/04/2024  DOB: 1946-06-08  WU:[id number], Karyl Paget, MD  Artice Last, MD   REFERRING PHYSICIAN: Artice Last, MD  DIAGNOSIS:    ICD-10-CM   1. Primary cancer of tonsillar fossa (HCC)  C09.0        Cancer Staging  Primary cancer of tonsillar fossa (HCC) Staging form: Pharynx - HPV-Mediated Oropharynx, AJCC 8th Edition - Clinical stage from 03/04/2024: Stage I (cT2, cN0, cM0, p16+) - Signed by Pearlene Bouchard, PA-C on 03/04/2024 Stage prefix: Initial diagnosis  Stage I (cT2, N0, M0) versus (cT2, N3, M0) squamous cell carcinoma of the right tonsil, p16+   CHIEF COMPLAINT: Here to discuss management of tonsillar cancer  HISTORY OF PRESENT ILLNESS::Christian Bowman is a 78 y.o. male who presented with to the ED on 02/18/24 for an episode of hemoptysis and complains of dysphagia.  Patient reports longstanding history (7-8 years) of a large right neck mass for which he had a workup and was assured that it was benign. Most recently, her presented to the ER earlier today due to an episode of hemoptysis when he coughed up a large bright red blood clot. To further investigate his symptoms, he underwent a CT neck in the ER showing a large tonsillar mass measuring 2.2 x 3.1 x 3.7 cm on the right side with necrotic lymph nodes concerning for malignancy and a 6.5 x 4.0 cm predominantly low attenuation lesion centered at the right level 2 station, concerning for a cystic/necrotic lymph node. Scan also indicated a nonspecific 5 mm cystic-appearing lesion within the vallecula on the right.      Subsequently, the patient saw Dr. Soldatova who preformed a flexible laryngoscopy showing evidence of a large exophytic mass involving right tonsil and soft palate but without any obvious involvement of the base of the tongue.  Biopsy of right tonsillar mass on 02/18/24  revealed: squamous cell carcinoma, p16 positive.   Pertinent imaging thus far includes PET performed on 02/28/24 revealing a hypermetabolic mass centered in the right palatine tonsil pharyngeal region extending to the glossal tonsillar sulcus and the soft palate. No additional areas of abnormal uptake at this time to suggest spread of disease. Scan also noted small lung nodule seen previously are not well seen on the current examination but there is significant motion.   CT chest preformed on 02/18/24 showing a new 5 mm nodule within the posterior and medial left lower lobe with follow up recommended. No acute findings within the chest. No explanation for patient's hemoptysis within the chest and mild diffuse bronchial wall thickening is nonspecific, but can be seen in the setting of bronchitis.   Swallowing issues, if any: Dysphagia with bread. Patient denies odynophagia.   Weight Changes: Weight has remained stable.   Pain status: Patient denies pain.   Other symptoms: right-sided neck swelling, throat clearing  Tobacco history, if any: none   ETOH abuse, if any: none  Prior cancers, if any: none   He has a cystic lesion in the mid right forehead which he reports is from a traumatic injury as a child  He also reports that he had a cystic lesion in the left neck which regressed several years ago after appearing at the same time as the right neck mass  PREVIOUS RADIATION THERAPY: No  PAST MEDICAL HISTORY:  has a past medical history of AICD (automatic cardioverter/defibrillator)  present, Anginal pain (HCC), Anxiety, CAD (coronary artery disease), CHF (congestive heart failure) (HCC), Complication of anesthesia, Coronary artery disease involving coronary bypass graft, Cyst of neck, DM2 (diabetes mellitus, type 2) (HCC) (08/26/2013), Dyspnea, Heart attack (HCC), HTN (hypertension) (08/26/2013), Hyperlipidemia (08/26/2013), Hypothyroidism, Left main coronary artery disease, Left renal artery  stenosis (HCC), Obesity (BMI 30.0-34.9) (08/26/2013), Postoperative atrial fibrillation (HCC) (10/15/2005), Presence of permanent cardiac pacemaker, S/P CABG x 4 (10/13/2005), S/P mitral valve replacement with bioprosthetic valve (07/11/2016), and S/P redo CABG x 2 (07/11/2016).    PAST SURGICAL HISTORY: Past Surgical History:  Procedure Laterality Date   A-FLUTTER ABLATION N/A 05/11/2017   Procedure: A-Flutter Ablation;  Surgeon: Lei Pump, MD;  Location: [facility] INVASIVE CV LAB;  Service: Cardiovascular;  Laterality: N/A;   BIV ICD GENERATOR CHANGEOUT N/A 08/29/2023   Procedure: BIV ICD GENERATOR CHANGEOUT;  Surgeon: Lei Pump, MD;  Location: [facility] INVASIVE CV LAB;  Service: Cardiovascular;  Laterality: N/A;   CARDIAC CATHETERIZATION N/A 06/21/2016   Procedure: Right/Left Heart Cath and Coronary/Graft Angiography;  Surgeon: Arnoldo Lapping, MD;  Location: [facility] INVASIVE CV LAB;  Service: Cardiovascular;  Laterality: N/A;   CARDIAC VALVE REPLACEMENT     CARDIOVERSION N/A 07/19/2016   Procedure: CARDIOVERSION;  Surgeon: Lenise Quince, MD;  Location: [facility] ENDOSCOPY;  Service: Cardiovascular;  Laterality: N/A;   CARDIOVERSION N/A 09/08/2016   Procedure: CARDIOVERSION;  Surgeon: Elmyra Haggard, MD;  Location: [facility] ENDOSCOPY;  Service: Cardiovascular;  Laterality: N/A;   CORONARY ANGIOPLASTY     STENT 2016  [facility]   CORONARY ANGIOPLASTY WITH STENT PLACEMENT     DES in SVG to right coronary artery system   CORONARY ARTERY BYPASS GRAFT  10/13/2005   LIMA to LAD, SVG to intermediate branch, sequential SVG to PDA and RPL   CORONARY ARTERY BYPASS GRAFT N/A 07/11/2016   Procedure: REDO CORONARY ARTERY BYPASS GRAFTING (CABG) x two using left leg greater saphenous vein harvested endoscopically-SVG to PDA -SVG to RAMUS INTERMEDIATE;  Surgeon: Gardenia Jump, MD;  Location: [facility] OR;  Service: Open Heart Surgery;  Laterality: N/A;   CORONARY ARTERY BYPASS GRAFT N/A 07/11/2016   Procedure:  Re-exploration (CABG) for post op bleeding,;  Surgeon: Gardenia Jump, MD;  Location: [facility] OR;  Service: Open Heart Surgery;  Laterality: N/A;   EP IMPLANTABLE DEVICE N/A 07/25/2016   Procedure: BiV ICD Insertion CRT-D;  Surgeon: Will Cortland Ding, MD;  Location: [facility] INVASIVE CV LAB;  Service: Cardiovascular;  Laterality: N/A;   INGUINAL HERNIA REPAIR Left 08/20/2017   INGUINAL HERNIA REPAIR Left 08/20/2017   Procedure: OPEN LEFT INGUINAL HERNIA REPAIR;  Surgeon: Juanita Norlander, MD;  Location[facility] OR;  Service: General;  Laterality: Left;   LEFT HEART CATH AND CORS/GRAFTS ANGIOGRAPHY N/A 12/18/2016   Procedure: Left Heart Cath and Cors/Grafts Angiography;  Surgeon: Arnoldo Lapping, MD;  Location: [facility] INVASIVE CV LAB;  Service: Cardiovascular;  Laterality: N/A;   LEFT HEART CATH AND CORS/GRAFTS ANGIOGRAPHY N/A 08/22/2017   Procedure: LEFT HEART CATH AND CORS/GRAFTS ANGIOGRAPHY;  Surgeon: Darlis Eisenmenger, MD;  Location: [facility] INVASIVE CV LAB;  Service: Cardiovascular;  Laterality: N/A;   MITRAL VALVE REPLACEMENT N/A 07/11/2016   Procedure: MITRAL VALVE (MV) REPLACEMENT;  Surgeon: Gardenia Jump, MD;  Location: [facility] OR;  Service: Open Heart Surgery;  Laterality: N/A;   MYOCARDICAL PERFUSION  10/09/2007   NORMAL PERFUSION IN ALL REGIONS;NO EVIDENCE OF INDUCIBLE ISCHEMIA;POST STRESS EF% 66   RENAL ARTERY STENT Right 2007   RENAL DOPPLER  03/28/2010   RIGHT RA-NORMAL;LEFT PROXIMAL RA AT STENT-PATENT WITH NO EVIDENCE OF SIGN DIAMETER REDUCTION. R & L KIDNEYS: EQUAL IN SIZE,SYMMETRICAL IN SHAPE.   RIGHT HEART CATH N/A 01/08/2020   Procedure: RIGHT HEART CATH;  Surgeon: Darlis Eisenmenger, MD;  Location: [facility] INVASIVE CV LAB;  Service: Cardiovascular;  Laterality: N/A;   TEE WITHOUT CARDIOVERSION N/A 06/15/2016   Procedure: TRANSESOPHAGEAL ECHOCARDIOGRAM (TEE);  Surgeon: Luana Rumple, MD;  Location: [facility] ENDOSCOPY;  Service: Cardiovascular;  Laterality: N/A;   TEE WITHOUT CARDIOVERSION N/A 07/11/2016   Procedure:  TRANSESOPHAGEAL ECHOCARDIOGRAM (TEE);  Surgeon: Gardenia Jump, MD;  Location: [facility] OR;  Service: Open Heart Surgery;  Laterality: N/A;   TEE WITHOUT CARDIOVERSION N/A 07/19/2016   Procedure: TRANSESOPHAGEAL ECHOCARDIOGRAM (TEE);  Surgeon: Lenise Quince, MD;  Location: [facility] ENDOSCOPY;  Service: Cardiovascular;  Laterality: N/A;   TRANSESOPHAGEAL ECHOCARDIOGRAM  10/19/2005   NORMAL LV; MILD TO MODERATE AMOUNT OF SOFT ATHEROMATOUS PLAQUE OF THE THORACIC AORTA; THE LEFT ATRIUM IS MILDLY DILATED;LEFT ATRIAL APPENDAGE FUNCTION IS NORMAL;NO THROMBUS IDENTIFIED. SMALL PFO WITH RIGHT TO LEFT SHUNT    FAMILY HISTORY: family history includes Heart attack in his father and mother; Heart disease in his father, maternal grandmother, and mother; Hypertension in his father and sister.  SOCIAL HISTORY:  reports that he has never smoked. He has never used smokeless tobacco. He reports that he does not drink alcohol and does not use drugs.  ALLERGIES: Xanax [alprazolam]  MEDICATIONS:  Current Outpatient Medications  Medication Sig Dispense Refill   acetaminophen  (TYLENOL ) 325 MG tablet Take 325 mg by mouth every 6 (six) hours as needed for mild pain (pain score 1-3).     apixaban  (ELIQUIS ) 5 MG TABS tablet Take 1 tablet (5 mg total) by mouth 2 (two) times daily. 60 tablet 6   carvedilol  (COREG ) 12.5 MG tablet TAKE 1 & 1/2 (ONE & ONE-HALF) TABLETS BY MOUTH TWICE DAILY WITH A MEAL 180 tablet 3   Cholecalciferol  (VITAMIN D ) 50 MCG (2000 UT) tablet Take 2,000 Units by mouth daily with breakfast.     dapagliflozin  propanediol (FARXIGA ) 10 MG TABS tablet Take 1 tablet (10 mg total) by mouth daily. 90 tablet 3   digoxin  (LANOXIN ) 0.125 MG tablet Take 1/2 (one-half) tablet by mouth once daily 45 tablet 3   ENTRESTO  97-103 MG Take 1 tablet by mouth twice daily 180 tablet 0   eplerenone  (INSPRA ) 50 MG tablet Take 1 tablet (50 mg total) by mouth daily. 90 tablet 3   glipiZIDE  (GLUCOTROL  XL) 2.5 MG 24 hr tablet Take 1  tablet (2.5 mg total) by mouth daily with breakfast. 90 tablet 1   glucose blood (ACCU-CHEK GUIDE) test strip Use  to monitor glucose once a day at fasting. 100 each 2   levothyroxine  (SYNTHROID ) 75 MCG tablet Take 0.5 tablets (37.5 mcg total) by mouth daily before breakfast.     neomycin-bacitracin-polymyxin (NEOSPORIN) ointment Apply 1 application  topically daily as needed for wound care.     nitroGLYCERIN  (NITROSTAT ) 0.4 MG SL tablet DISSOLVE 1 TABLET UNDER THE TONGUE EVERY 5 MINUTES AS NEEDED FOR CHEST PAIN, DO NOT EXCEED 3 DOSES IN 15 MINUTES 25 tablet 1   polyethylene glycol (MIRALAX ) 17 g packet Take 17 g by mouth daily as needed for mild constipation or moderate constipation.     rosuvastatin  (CRESTOR ) 10 MG tablet Take 1 tablet (10 mg total) by mouth daily. 90 tablet 3   sodium zirconium cyclosilicate  (LOKELMA ) 10 g PACK packet DISSOLVE 1  PACKET IN WATER & DRINK ONCE DAILY 90 each 3   Tetrahydrozoline HCl (VISINE OP) Place 1 drop into both eyes daily as needed (irritation).     Blood Glucose Monitoring Suppl (ACCU-CHEK GUIDE ME) w/Device KIT 1 Piece by Does not apply route as directed. 1 kit 0   furosemide  (LASIX ) 20 MG tablet Take 1 tablet (20 mg total) by mouth daily as needed (for a weight over 190 lbs). (Patient not taking: Reported on 03/04/2024) 30 tablet 11   No current facility-administered medications for this encounter.    REVIEW OF SYSTEMS:  Notable for that above.   PHYSICAL EXAM:  height is 6' (1.829 m) and weight is 193 lb 12.8 oz (87.9 kg). His temperature is 97.9 F (36.6 C). His blood pressure is 123/81. His respiration is 18 and oxygen saturation is 100%.   General: Alert and oriented, in no acute distress HEENT: Head is normocephalic. Extraocular movements are intact. Oropharynx is notable for 3-4cm exophytic lesion involving the entire right tonsillar fossa/with palate involvement, reaching the uvula but not crossing midline. No other concerning lesions visualized in  the oropharynx.  Tongue is midline Neck: Neck is notable for a right cervical mass that is mobile, soft, and approximately 6 cm in greatest dimension.  Fullness in the left submandibular gland Heart: Regular in rate and rhythm with no murmurs, rubs, or gallops. Chest: Clear to auscultation bilaterally, with no rhonchi, wheezes, or rales. Abdomen: Soft, nontender, nondistended, with no rigidity or guarding. Extremities: No cyanosis or edema. Lymphatics: see Neck Exam Skin: No concerning lesions. Musculoskeletal: symmetric strength and muscle tone throughout. Neurologic: Cranial nerves II through XII are grossly intact. No obvious focalities. Speech is fluent. Coordination is intact. Psychiatric: Judgment and insight are intact. Affect is appropriate.   ECOG = 1  0 - Asymptomatic (Fully active, able to carry on all predisease activities without restriction)  1 - Symptomatic but completely ambulatory (Restricted in physically strenuous activity but ambulatory and able to carry out work of a light or sedentary nature. For example, light housework, office work)  2 - Symptomatic, <50% in bed during the day (Ambulatory and capable of all self care but unable to carry out any work activities. Up and about more than 50% of waking hours)  3 - Symptomatic, >50% in bed, but not bedbound (Capable of only limited self-care, confined to bed or chair 50% or more of waking hours)  4 - Bedbound (Completely disabled. Cannot carry on any self-care. Totally confined to bed or chair)  5 - Death   Aurea Blossom MM, Creech RH, Tormey DC, et al. [phone]). "Toxicity and response criteria of the [facility]". Am. Hillard Lowes. Oncol. 5 (6): 649-55   LABORATORY DATA:  Lab Results  Component Value Date   WBC 7.2 02/19/2024   HGB 15.4 02/19/2024   HCT 46.9 02/19/2024   MCV 92.7 02/19/2024   PLT 121 (L) 02/19/2024   CMP     Component Value Date/Time   NA 139 02/19/2024 0711   NA 140 08/21/2023  1307   K 4.4 02/19/2024 0711   CL 107 02/19/2024 0711   CO2 25 02/19/2024 0711   GLUCOSE 102 (H) 02/19/2024 0711   BUN 30 (H) 02/19/2024 0711   BUN 37 (H) 08/21/2023 1307   CREATININE 1.75 (H) 02/19/2024 0711   CALCIUM  8.9 02/19/2024 0711   PROT 6.3 (L) 02/18/2024 0515   PROT 6.8 08/21/2023 1306   ALBUMIN  3.3 (L) 02/18/2024 0515   ALBUMIN  4.3  08/21/2023 1306   AST 20 02/18/2024 0515   ALT 19 02/18/2024 0515   ALKPHOS 48 02/18/2024 0515   BILITOT 1.1 02/18/2024 0515   BILITOT 0.8 08/21/2023 1306   GFRNONAA 40 (L) 02/19/2024 0711   GFRAA 46 (L) 09/22/2020 0859      Lab Results  Component Value Date   TSH 5.650 (H) 02/28/2024     RADIOGRAPHY: NM PET Image Initial (PI) Skull Base To Thigh (F-18 FDG) Result Date: 02/28/2024 CLINICAL DATA:  Initial treatment strategy for right tonsillar squamous cell carcinoma. EXAM: NUCLEAR MEDICINE PET SKULL BASE TO THIGH TECHNIQUE: 9.82 mCi F-18 FDG was injected intravenously. Full-ring PET imaging was performed from the skull base to thigh after the radiotracer. CT data was obtained and used for attenuation correction and anatomic localization. Fasting blood glucose: 172 mg/dl COMPARISON:  CT neck and chest 02/18/2024. Older exams as well. MRI abdomen 2016. FINDINGS: Mediastinal blood pool activity: SUV max 2.5 Liver activity: SUV max 3.0 NECK: Once again there is a mass centered in the right palatine tonsil pharyngeal region extending to the glossal tonsillar sulcus and the soft palate. This has significant abnormal radiotracer uptake with maximum SUV value of 10.7. No additional areas of abnormal uptake identified in the neck including along lymph node chains the submandibular, posterior triangle or internal jugular region. As described previously there is a cystic lesion identified in the right neck, caudal and anterior to the right parotid gland, posterolateral to the submandibular gland and lateral to the carotid space. Appearance is similar. No  abnormal uptake. Please correlate for other potential neck cystic lesions. On prior contrast neck CT this measured 6.5 x 4.0 cm. Incidental CT findings: Scattered vascular calcifications identified. Small left thyroid  lobe. The submandibular glands are preserved. Streak artifact related to the patient's dental hardware. Visualized paranasal sinuses and mastoid air cells are clear. CHEST: Small area of uptake identified in the area of some calcification abutting the left side of the anterior superior mediastinum on image 61. The soft tissue thickening in this location is similar going back to a study of 2023 CT scan with a focus of nodularity measuring 12 x 8 mm on series 202, image 61. In 2023 area measured 16 x 9 mm. Maximum SUV of 3.8. No additional areas of abnormal uptake above blood pool in the axillary region, hilum or mediastinum. No other areas of abnormal lung uptake. Incidental CT findings: Status post median sternotomy. The heart is enlarged. Battery pack along the left upper thorax with leads extending along the right side of the heart. prosthetic mitral valve. Diffuse vascular calcifications along the thoracic aorta. Thoracic esophagus is normal course and caliber. Breathing motion. Dependent lung base atelectasis. No consolidation, pneumothorax or effusion. ABDOMEN/PELVIS: Physiologic distribution radiotracer along the parenchymal organs, bowel and renal collecting systems. No abnormal nodal uptake. Incidental CT findings: Extensive vascular calcifications identified. Grossly, the liver, spleen, adrenal glands are unremarkable. Mild global pancreatic atrophy. Gallbladder is nondilated. Moderate renal atrophy, left worse than right. Once again there are numerous left-sided renal cystic foci. These are low density on CT. Nonobstructing left-sided lower pole renal stone. No ureteral stones. Preserved contour to the urinary bladder. Small bilateral fat containing inguinal hernias. Stomach is nondilated.  The small bowel has a normal course and caliber. There is high density material along the course of the colon. Scattered colonic diverticula. Scattered stool. Normal appendix posterolateral to the cecum in the right hemipelvis. Diffuse motion. SKELETON: No abnormal uptake along the visualized osseous structures. Incidental  CT findings: Diffuse scattered degenerative changes IMPRESSION: Primary lesion along the right palatine tonsil region is hypermetabolic consistent with history of squamous cell carcinoma. No additional areas of abnormal uptake at this time to suggest spread of disease. These large cystic lesion in the right neck does not have any abnormal uptake. Although in the setting of a known malignancy in the neck, cystic nodal metastasis without uptake is technically possible as felt to be less likely. Another possibility would be a type 2 branchial cleft cyst but again there is no clear neck extending between the ICA and ECA. Please correlate for any known history. Any prior imaging of the area may be useful to assess for chronicity. Please correlate with any particular symptoms. A neck MRI without contrast may be of some benefit but the patient does have a defibrillator. Please correlate for MRI clearance. Small lung nodule seen previously are not well seen on the current examination but there is significant motion. Follow up as per prior recommendations. Postop chest with defibrillator. Enlarged heart. Mild increased uptake seen along an area of nodularity along the left side of the upper mediastinum but this area has been present since 2023. Colonic diverticula. Renal atrophy. Nonobstructing renal stone. Other ancillary findings as above Electronically Signed   By: Adrianna Horde M.D.   On: 02/28/2024 19:01   CT Angio Neck W and/or Wo Contrast Result Date: 02/18/2024 CLINICAL DATA:  78 year old male with abnormal neck CT this morning revealing large right oropharyngeal mass, bulky necrotic right neck  lymph node. Subsequent encounter. EXAM: CT ANGIOGRAPHY NECK TECHNIQUE: Multidetector CT imaging of the neck was performed using the standard protocol during bolus administration of intravenous contrast. Multiplanar CT image reconstructions and MIPs were obtained to evaluate the vascular anatomy. Carotid stenosis measurements (when applicable) are obtained utilizing NASCET criteria, using the distal internal carotid diameter as the denominator. RADIATION DOSE REDUCTION: This exam was performed according to the departmental dose-optimization program which includes automated exposure control, adjustment of the mA and/or kV according to patient size and/or use of iterative reconstruction technique. CONTRAST:  75mL ISOVUE -370 IOPAMIDOL  (ISOVUE -370) INJECTION 76% COMPARISON:  Neck CT 0710 hours today. CT Chest today also reported separately. FINDINGS: CTA NECK Skeleton: Sternotomy. Stable small sclerotic upper thoracic vertebral body focus since the 2023 CT. No acute or suspicious osseous lesion identified. Upper chest: Reported separately today. Other neck: Reported separately today. Aortic arch: Calcified aortic atherosclerosis.  3 vessel arch. Right carotid system: Tortuous brachiocephalic artery and proximal right CCA without significant plaque or stenosis. Patent right carotid bifurcation with combined soft and calcified plaque, no significant stenosis. Mild tortuosity of the right ICA below the skull base. No right carotid tumor related vessel changes are extravasation. Left carotid system: Similar tortuosity. Soft and calcified plaque at the left CCA origin, left ICA origin and bulb without significant stenosis. Vertebral arteries: Mild proximal right subclavian artery plaque without stenosis. Normal right vertebral artery origin. Patent right vertebral artery to the skull base through the V2 segment with no significant plaque or stenosis. Right V3 segment calcified plaque with less than 50% stenosis. Proximal left  subclavian artery calcified plaque without stenosis. Normal left vertebral artery origin. Non dominant left vertebral artery is patent to the skull base with no significant plaque or stenosis. LIMITED CTA HEAD Posterior circulation: Dominant right vertebral artery is patent to the vertebrobasilar junction with mild calcified plaque and no stenosis. Patent right PICA origin. Non dominant left vertebral artery is highly atherosclerotic, with moderate to severe V4  segment stenosis just proximal to the left PICA origin (series 11, image 29) but remains patent to the vertebrobasilar junction. Left PICA origin remains patent. Visible basilar artery is patent with irregularity. SCA and PCA origins appear to be patent. Anterior circulation: Patent ICA siphons with calcified plaque. Mild to moderate left and moderate to severe right supraclinoid ICA stenosis (series 9, image 125 on the right). Patent carotid termini, MCA and ACA origins. Visible major anterior circulation branches are patent. Anatomic variants: Dominant right vertebral artery. Review of the MIP images confirms the above findings IMPRESSION: 1. Negative for large vessel occlusion and generally mild atherosclerosis in the neck. No hemodynamically significant extracranial stenosis. 2. Advanced atherosclerosis at the skull base with up to severe stenosis of both the distal Right ICA siphon (supraclinoid segment) and Left vertebral artery (V4 segment just proximal to PICA which remains patent). 3. Bulky Right oropharyngeal Mass and cystic/necrotic Right neck lymph node as detailed on Neck CT this morning separately. 4.  Aortic Atherosclerosis (ICD10-I70.0). Electronically Signed   By: Marlise Simpers M.D.   On: 02/18/2024 13:20   CT Soft Tissue Neck W Contrast Result Date: 02/18/2024 CLINICAL DATA:  Provided history: Soft tissue infection suspected, neck, x-ray done. EXAM: CT NECK WITH CONTRAST TECHNIQUE: Multidetector CT imaging of the neck was performed using the  standard protocol following the bolus administration of intravenous contrast. RADIATION DOSE REDUCTION: This exam was performed according to the departmental dose-optimization program which includes automated exposure control, adjustment of the mA and/or kV according to patient size and/or use of iterative reconstruction technique. CONTRAST:  75mL OMNIPAQUE  IOHEXOL  350 MG/ML SOLN COMPARISON:  Prior chest CT examinations 02/18/2024 and earlier. FINDINGS: Pharynx and larynx: 2.2 x 3.1 x 3.7 cm mass centered at the right palatine tonsil/pharynx and extending inferiorly to the glossotonsillar sulcus. The mass likely also involves the soft palate. Resultant mild-to-moderate narrowing of the oropharyngeal airway. Nonspecific 5 mm cystic-appearing lesion within the vallecula on the right (series 1, image 78) (series 7, image 73). Salivary glands: No inflammation, mass, or stone. Thyroid : Unremarkable. Lymph nodes: 6.5 x 4.0 cm predominantly low-attenuation ovoid lesion centered at the right level 2 station concerning for a cystic/necrotic lymph node (from nodal metastatic disease). No pathologically enlarged lymph nodes identified elsewhere within the neck. Vascular: The major vascular structures of the neck are patent. Atherosclerotic plaque within the visualized thoracic aorta, proximal major branch vessels of the neck, as well as carotid and vertebral arteries. Limited intracranial: No evidence of an acute intracranial abnormality within the field of view. Visualized orbits: No orbital mass or acute orbital finding. Mastoids and visualized paranasal sinuses: Portions of the frontal sinuses are excluded from the field of view superiorly. No significant paranasal sinus disease at the imaged levels. No mastoid effusion. Skeleton: Subcentimeter sclerotic lesion within the T3 vertebral body, stable dating back to the chest CT of 07/07/2016 and likely benign. Mild T1 superior endplate compression deformity, new from the prior  chest CT of 11/01/2022 but otherwise age-indeterminate. Cervical spondylosis. Upper chest: Separately reported on same day chest CT. Other: Partially imaged forehead lipoma near midline (measuring at least 12 x 5 mm). Impressions #1, #2 and #3 called by telephone at the time of interpretation on 02/18/2024 at 7:45 am to provider Dr. Martina Sledge, who verbally acknowledged these results. IMPRESSION: 1. 3.7 cm mass centered at the right palatine tonsil/pharynx and extending inferiorly to the glossotonsillar sulcus. The mass likely also involves the soft palate. This most consistent with a mucosal/submucosal neoplasm (likely  squamous cell carcinoma). Resultant mild-to-moderate effacement of the oropharyngeal airway. ENT referral and direct visualization recommended. 2. 6.5 x 4.0 cm predominantly low attenuation lesion centered at the right level 2 station, concerning for a cystic/necrotic lymph node (from nodal metastatic disease) given findings described in impression #1. 3. Nonspecific 5 mm cystic-appearing lesion within the vallecula on the right. Direct visualization recommended. 4. Partially imaged forehead lipoma, measuring at least 12 x 5 mm. 5. Mild T1 superior endplate vertebral compression deformity, new from the prior chest CT of 11/01/2022 but otherwise age-indeterminate. Electronically Signed   By: Bascom Lily D.O.   On: 02/18/2024 07:52   CT Chest W Contrast Result Date: 02/18/2024 CLINICAL DATA:  Hemoptysis. EXAM: CT CHEST WITH CONTRAST TECHNIQUE: Multidetector CT imaging of the chest was performed during intravenous contrast administration. RADIATION DOSE REDUCTION: This exam was performed according to the departmental dose-optimization program which includes automated exposure control, adjustment of the mA and/or kV according to patient size and/or use of iterative reconstruction technique. CONTRAST:  75mL OMNIPAQUE  IOHEXOL  350 MG/ML SOLN COMPARISON:  11/01/2022 FINDINGS: Cardiovascular: Mild cardiac  enlargement. Previous CABG and mitral valve replacement there is a left chest wall ICD with leads in the right atrial appendage, right ventricle and coronary sinus. No pericardial effusion. Aortic atherosclerotic calcifications. Mediastinum/Nodes: No enlarged mediastinal, hilar, or axillary lymph nodes. Thyroid  gland, trachea, and esophagus demonstrate no significant findings. Small hiatal hernia. Lungs/Pleura: No pleural effusion or consolidative change. No pneumothorax identified. Central airways appear patent. Mild diffuse bronchial wall thickening. -Stable subpleural nodule in the posterior right upper lobe measuring 5 mm, image 48/3. -Tiny nodule in the medial left upper lobe is also stable measuring 3 mm. -Small ground-glass nodule within the inferior right middle lobe is stable measuring 4 mm, image 110/3. -New nodule within the posterior and medial left lower lobe measures 5 mm, image 133/4. Upper Abdomen: No acute abnormality. Aortic atherosclerotic calcifications. Musculoskeletal: No acute or suspicious osseous findings. Unchanged dense sclerotic lesion within the upper thoracic spine which likely represents a benign bone island, image 99/7. IMPRESSION: 1. No acute findings within the chest. No explanation for patient's hemoptysis within the chest. 2. Mild diffuse bronchial wall thickening is nonspecific, but can be seen in the setting of bronchitis. 3. New 5 mm nodule within the posterior and medial left lower lobe. If the patient is at high risk for bronchogenic carcinoma, follow-up chest CT at 1year is recommended. If the patient is at low risk, no follow-up is needed. This recommendation follows the consensus statement: Guidelines for Management of Small Pulmonary Nodules Detected on CT Scans: A Statement from the Fleischner Society as published in Radiology 2005; 237:395-400. 4.  Aortic Atherosclerosis (ICD10-I70.0). Electronically Signed   By: Kimberley Penman M.D.   On: 02/18/2024 07:29   DG Chest 2  View Result Date: 02/18/2024 CLINICAL DATA:  Hemoptysis EXAM: CHEST - 2 VIEW COMPARISON:  None Available. FINDINGS: Lungs are clear. No pneumothorax or pleural effusion. Coronary artery bypass grafting and mitral valve replacement has been performed. Cardiac size is mildly enlarged, new since prior examination. Left subclavian 3 lead pacemaker is unchanged in position. Vascularity is normal. Osseous structures are age-appropriate. IMPRESSION: 1. Interval development of mild cardiomegaly. Electronically Signed   By: Worthy Heads M.D.   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  He reports that he had a right cystic neck mass several years ago which regressed on its own.  He more recently presented with a right tonsillar mass, biopsy proven p16+ SCC. It is unclear if the right neck mass mass is benign versus metastatic disease. Given that it has been present for 8 years with minimal change and previous history of benign cytology, it is quite possibly non-malignant in nature but still a concern. Patient has a PMH significant for cardiac issues and difficulty with waking up from anesthesia, making him very nervous to proceed with surgery for diagnostic or therapeutic reasons  If he is not a surgical candidate for opts to forego surgery, Dr. Lurena Sally recommends radiation +/- chemotherapy. Patient would like to prioritize a good quality of life and would therefore like to pursue definitive radiation, if possible.  We will discuss his case at our tumor board tomorrow and share our recommendations with the patient.  He is also considering comfort care measures alone but that is not Dr. Barrie Borer recommendation at this time as he carries a significant chance of cure with definitive  radiation.  We discussed the potential risks, benefits, and side effects of radiotherapy. We talked in detail about acute and late effects. We discussed that some of the most bothersome acute effects may be mucositis, dysgeusia, salivary changes, skin irritation, hair loss, dehydration, weight loss and fatigue. We talked about late effects which include but are not necessarily limited to dysphagia, hypothyroidism, nerve injury, vascular injury, spinal cord injury, xerostomia, trismus, neck edema, dental issues, non-healing wound, and potentially fatal injury to any of the tissues in the head and neck region. No guarantees of treatment were given. A consent form was signed and placed in the patient's medical record. The patient is enthusiastic about proceeding with treatment. I look forward to participating in the patient's care.    Simulation (treatment planning) will take place in the near future if patient elects for radiation therapy  We also discussed that the treatment of head and neck cancer is a multidisciplinary process to maximize treatment outcomes and quality of life. For this reason the following referrals have been or will be made:   Medical oncology to discuss chemotherapy, if deemed necessary after our discussion tomorrow.    Dentistry for dental evaluation, possible extractions in the radiation fields, and /or advice on reducing risk of cavities, osteoradionecrosis, or other oral issues.   Nutritionist for nutrition support during and after treatment.   Speech language pathology for swallowing and/or speech therapy.   Social work for social support.    Physical therapy due to risk of lymphedema in neck and deconditioning.   Baseline labs including TSH. Of note, patient does have a history of hypothyroidism and is on levothyroxine  for this issue. His thyroid  levels are being checked twice a year under the care of his endocrinologist.  On date of service, in total, we spent 90  minutes on this encounter. Patient was seen in person.  __________________________________________    Julio Ohm, PA-C   Colie Dawes, MD    [facility]  Radiation Oncology Direct Dial: [phone]  Fax: [phone] [email or web address]    This document serves as a record of services personally performed by Colie Dawes, MD and Julio Ohm, PA-C. It was created on her behalf by Lucky Sable, a trained medical scribe. The creation of this record is based on the scribe's personal observations and the  provider's statements to them. This document has been checked and approved by the attending provider.

## 2024-03-03 NOTE — Progress Notes (Signed)
 Head and Neck Cancer Location of Tumor / Histology:  Primary Squamous Cell Carcinoma  Patient presented  months ago with symptoms of:  Patient presented with swelling on right side neck and voice hoarseness. Occasional choking spells while eating food.  Two weeks ago was coughing up blood and went to the emergency room  Biopsies revealed:    02/18/2024 CT SOFT Tissue Neck W Contrast     Nutrition Status Yes No Comments  Weight changes? []  [x]  Weight has stayed stable.  Swallowing concerns? []  [x]  Occasional trouble swallowing solid foods,  Patient is able to eat three meals a day, he eats chicken, salmon, white rice, avoids eating red meat.  PEG? []  [x]     Referrals Yes No Comments  Social Work? [x]  []    Dentistry? [x]  []    Swallowing therapy? [x]  []    Nutrition? [x]  []    Med/Onc? [x]  []     Safety Issues Yes No Comments  Prior radiation? []  [x]    Pacemaker/ICD? [x]  []    Possible current pregnancy? []  [x]    Is the patient on methotrexate? []  [x]     Tobacco/Marijuana/Snuff/ETOH use:  None  Past/Anticipated interventions by otolaryngology, if any:  02/18/2024 Larkin Plumb, MD  Flexible Fiberoptic Laryngoscopy  Past/Anticipated interventions by medical oncology, if any: [place], MD      Current Complaints / other details:   None

## 2024-03-04 ENCOUNTER — Ambulatory Visit
Admission: RE | Admit: 2024-03-04 | Discharge: 2024-03-04 | Disposition: A | Discharge status: Home / Self Care | Source: Ambulatory Visit | Attending: Radiation Oncology | Admitting: Radiation Oncology

## 2024-03-04 VITALS — BP 123/81 | Temp 97.9°F | Resp 18 | Ht 72.0 in | Wt 193.8 lb

## 2024-03-04 DIAGNOSIS — C090 Malignant neoplasm of tonsillar fossa: Secondary | ICD-10-CM

## 2024-03-04 NOTE — Progress Notes (Signed)
 Dental Form with Estimates of Radiation Dose      Diagnosis:    ICD-10-CM   1. Primary cancer of tonsillar fossa (HCC)  C09.0       Prognosis: curative  PATIENT STILL UNSURE ABOUT TREATMENT CHOICES  Anticipated # of fractions: 35    Daily?: yes  # of weeks of radiotherapy: 7   Chemotherapy?: ? TBD  Anticipated xerostomia:  Mild permanent    Pre-simulation needs:   Scatter protection    Simulation: ASAP    Other Notes:   Please contact Colie Dawes, MD, with patient's disposition after evaluation and/or dental treatment.

## 2024-03-04 NOTE — Progress Notes (Signed)
 Oncology Nurse Navigator Documentation   Met with patient during initial consult with Dr. Lurena Sally. He was accompanied by his wife.  Further introduced myself as his/their Navigator, explained my role as a member of the Care Team. Provided New Patient resource guide binder: Contact information for physicians, this navigator, other members of the Care Team Advance Directive information; provided [facility] AD booklet at their request,  Fall Prevention Patient Safety Plan Financial Assistance Information sheet Symptom Management Clinic information [facility] map with highlight of [facility] SLP Information sheet Head and Neck cancer basics Nutrition information Patient and family support information including Spiritual care/Chaplain information, Peer mentor program, health and wellness classes, and the survivorship program Community resources  Provided and discussed educational handouts for PEG and PAC. Assisted with post-consult appt scheduling. They have been contacted by Dr. Denette Finner office for an appointment with medical oncology. I encouraged her to call to get it scheduled as soon as possible.  I have contacted Dr. Benedetto Brady office to schedule him for an appointment for pre radiation dental clearance and scatter protective device fabrication.  They verbalized understanding of information provided. I encouraged them to call with questions/concerns moving forward.  Lynetta Saran, RN, BSN, OCN Head & Neck Oncology Nurse Navigator [facility] [phone]

## 2024-03-05 ENCOUNTER — Telehealth: Payer: Self-pay | Admitting: Oncology

## 2024-03-05 NOTE — Telephone Encounter (Signed)
 Calling to confirm upcoming appt,Spouse needs me to call back.

## 2024-03-05 NOTE — ED Provider Notes (Signed)
  [facility] Provider Note   CSN: [id number] Arrival date & time: 02/18/24  0236     History  Chief Complaint  Patient presents with   Hemoptysis    Christian Bowman is a 78 y.o. male.  78 yo M here with one episode of hemoptysis.  States that he felt like he had to clear his throat all day like there is some stuck there when he was coughing.  This evening he forced himself to cough and clear his throat and a dark blood clot came out.  No other bleeding.  No other feeling of foreign body in his throat.  On examination patient has what appears to be a large right sided neck mass however he states that he has been evaluated before and it is benign but is too close to his vasculature for them to do surgery.  Other history of cancer.  Is on Eliquis  and started that recently.        Home Medications Prior to Admission medications   Medication Sig Start Date End Date Taking? Authorizing Provider  acetaminophen  (TYLENOL ) 325 MG tablet Take 325 mg by mouth every 6 (six) hours as needed for mild pain (pain score 1-3).   Yes [provider]  apixaban  (ELIQUIS ) 5 MG TABS tablet Take 1 tablet (5 mg total) by mouth 2 (two) times daily. 01/23/24  Yes Croitoru, Mihai, MD  Cholecalciferol  (VITAMIN D ) 50 MCG (2000 UT) tablet Take 2,000 Units by mouth daily with breakfast.   Yes [provider]  dapagliflozin  propanediol (FARXIGA ) 10 MG TABS tablet Take 1 tablet (10 mg total) by mouth daily. 12/26/23  Yes Croitoru, Mihai, MD  digoxin  (LANOXIN ) 0.125 MG tablet Take 1/2 (one-half) tablet by mouth once daily 12/26/23  Yes Croitoru, Mihai, MD  ENTRESTO  97-103 MG Take 1 tablet by mouth twice daily 01/16/24  Yes McLean, Dalton S, MD  eplerenone  (INSPRA ) 50 MG tablet Take 1 tablet (50 mg total) by mouth daily. 06/11/23  Yes Darlis Eisenmenger, MD  furosemide  (LASIX ) 20 MG tablet Take 1 tablet (20 mg total) by mouth daily as needed (for a weight over 190 lbs). Patient not taking:  Reported on 03/04/2024 12/26/23  Yes Croitoru, Mihai, MD  glipiZIDE  (GLUCOTROL  XL) 2.5 MG 24 hr tablet Take 1 tablet (2.5 mg total) by mouth daily with breakfast. 09/13/23  Yes Nida, Jaynee Meyer, MD  nitroGLYCERIN  (NITROSTAT ) 0.4 MG SL tablet DISSOLVE 1 TABLET UNDER THE TONGUE EVERY 5 MINUTES AS NEEDED FOR CHEST PAIN, DO NOT EXCEED 3 DOSES IN 15 MINUTES 12/26/23  Yes Croitoru, Mihai, MD  rosuvastatin  (CRESTOR ) 10 MG tablet Take 1 tablet (10 mg total) by mouth daily. 06/11/23  Yes Darlis Eisenmenger, MD  sodium zirconium cyclosilicate  (LOKELMA ) 10 g PACK packet DISSOLVE 1 PACKET IN WATER & DRINK ONCE DAILY 09/24/23  Yes Croitoru, Mihai, MD  Tetrahydrozoline HCl (VISINE OP) Place 1 drop into both eyes daily as needed (irritation).   Yes [provider]  Blood Glucose Monitoring Suppl (ACCU-CHEK GUIDE ME) w/Device KIT 1 Piece by Does not apply route as directed. 02/01/22   Nida, Gebreselassie W, MD  carvedilol  (COREG ) 12.5 MG tablet TAKE 1 & 1/2 (ONE & ONE-HALF) TABLETS BY MOUTH TWICE DAILY WITH A MEAL 02/20/24   Darlis Eisenmenger, MD  glucose blood (ACCU-CHEK GUIDE) test strip Use  to monitor glucose once a day at fasting. 05/16/23   Nida, Gebreselassie W, MD  levothyroxine  (SYNTHROID ) 75 MCG tablet Take 0.5  tablets (37.5 mcg total) by mouth daily before breakfast. 02/19/24   Albin Huh, MD  neomycin-bacitracin-polymyxin (NEOSPORIN) ointment Apply 1 application  topically daily as needed for wound care.    [provider]  polyethylene glycol (MIRALAX ) 17 g packet Take 17 g by mouth daily as needed for mild constipation or moderate constipation.    [provider]      Allergies    Xanax [alprazolam]    Review of Systems   Review of Systems  Physical Exam Updated Vital Signs BP 116/63 (BP Location: Right Arm)   Pulse 60   Temp 97.7 F (36.5 C)   Resp 20   Ht 6' (1.829 m)   Wt 90 kg   SpO2 96%   BMI 26.91 kg/m  Physical Exam Vitals and nursing note reviewed.   Constitutional:      Appearance: He is well-developed.  HENT:     Head: Normocephalic and atraumatic.     Mouth/Throat:     Pharynx: Pharyngeal swelling, oropharyngeal exudate and posterior oropharyngeal erythema present.     Comments: Right greater than left tonsillar edema with out evidence of abscess Neck:     Comments: Large right neck mass just under the angle of the mandible.  Patient has a nasal sounding voice but no stridor Cardiovascular:     Rate and Rhythm: Normal rate.  Pulmonary:     Effort: Pulmonary effort is normal. No respiratory distress.  Abdominal:     General: There is no distension.  Musculoskeletal:        General: Normal range of motion.     Cervical back: Normal range of motion.  Neurological:     Mental Status: He is alert.     ED Results / Procedures / Treatments   Labs (all labs ordered are listed, but only abnormal results are displayed) Labs Reviewed  CBC WITH DIFFERENTIAL/PLATELET - Abnormal; Notable for the following components:      Result Value   WBC 10.8 (*)    Platelets 140 (*)    Neutro Abs 8.7 (*)    All other components within normal limits  PROTIME-INR - Abnormal; Notable for the following components:   Prothrombin Time 16.9 (*)    INR 1.4 (*)    All other components within normal limits  COMPREHENSIVE METABOLIC PANEL WITH GFR - Abnormal; Notable for the following components:   CO2 21 (*)    Glucose, Bld 161 (*)    BUN 34 (*)    Creatinine, Ser 1.59 (*)    Total Protein 6.3 (*)    Albumin  3.3 (*)    GFR, Estimated 45 (*)    All other components within normal limits  BASIC METABOLIC PANEL WITH GFR - Abnormal; Notable for the following components:   Glucose, Bld 102 (*)    BUN 30 (*)    Creatinine, Ser 1.75 (*)    GFR, Estimated 40 (*)    All other components within normal limits  CBC - Abnormal; Notable for the following components:   Platelets 121 (*)    All other components within normal limits  HEMOGLOBIN A1C -  Abnormal; Notable for the following components:   Hgb A1c MFr Bld 6.6 (*)    All other components within normal limits  GLUCOSE, CAPILLARY - Abnormal; Notable for the following components:   Glucose-Capillary 109 (*)    All other components within normal limits  GLUCOSE, CAPILLARY - Abnormal; Notable for the following components:   Glucose-Capillary 100 (*)  All other components within normal limits  GLUCOSE, CAPILLARY - Abnormal; Notable for the following components:   Glucose-Capillary 159 (*)    All other components within normal limits  GROUP A STREP BY PCR  SURGICAL PCR SCREEN  TSH  MAGNESIUM   TYPE AND SCREEN  SURGICAL PATHOLOGY    EKG None  Radiology No results found.  Procedures Procedures    Medications Ordered in ED Medications  spironolactone  (ALDACTONE ) 25 MG tablet (has no administration in time range)  digoxin  (LANOXIN ) 0.125 MG tablet (has no administration in time range)  mupirocin  ointment (BACTROBAN ) 2 % (has no administration in time range)  carvedilol  (COREG ) 6.25 MG tablet (has no administration in time range)  carvedilol  (COREG ) 12.5 MG tablet (has no administration in time range)  morphine  (PF) 2 MG/ML injection (has no administration in time range)  iohexol  (OMNIPAQUE ) 350 MG/ML injection 75 mL (75 mLs Intravenous Contrast Given 02/18/24 0712)  iopamidol  (ISOVUE -370) 76 % injection 75 mL (75 mLs Intravenous Contrast Given 02/18/24 1229)    ED Course/ Medical Decision Making/ A&P                                 Medical Decision Making Amount and/or Complexity of Data Reviewed Labs: ordered. Radiology: ordered.  Risk Prescription drug management. Decision regarding hospitalization.   Workup overall reassuring however get a CT scan to evaluate the neck especially with this large growth in this hemoptysis. Care transferred pending same.   Final Clinical Impression(s) / ED Diagnoses Final diagnoses:  Hemoptysis  Neck mass    Rx / DC  Orders ED Discharge Orders          Ordered    levothyroxine  (SYNTHROID ) 75 MCG tablet  Daily before breakfast        02/19/24 1407              Bryn Perkin, Reymundo Caulk, MD 03/05/24 [phone]

## 2024-03-06 ENCOUNTER — Encounter (INDEPENDENT_AMBULATORY_CARE_PROVIDER_SITE_OTHER): Payer: Self-pay | Admitting: Otolaryngology

## 2024-03-06 ENCOUNTER — Encounter: Payer: Self-pay | Admitting: Cardiovascular Disease

## 2024-03-06 ENCOUNTER — Ambulatory Visit (INDEPENDENT_AMBULATORY_CARE_PROVIDER_SITE_OTHER): Admitting: Otolaryngology

## 2024-03-06 VITALS — BP 128/87 | HR 84

## 2024-03-06 DIAGNOSIS — C099 Malignant neoplasm of tonsil, unspecified: Secondary | ICD-10-CM

## 2024-03-06 DIAGNOSIS — R221 Localized swelling, mass and lump, neck: Secondary | ICD-10-CM

## 2024-03-06 NOTE — Progress Notes (Signed)
 ENT CONSULT:  Reason for Consult: right oropharyngeal cancer    HPI: Discussed the use of AI scribe software for clinical note transcription with the patient, who gave verbal consent to proceed.  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   He is currently exploring treatment options, including surgery and chemoradiation. Concerns about anesthesia risks due to a low ejection fraction may impact his candidacy for surgery.  A large neck mass has been present for a long time, over 8 yrs. It did not light up on a recent PET scan, though there is a possibility it could still be cancerous. A previous drainage of the mass was negative for cancer and tuberculosis. Consideration for a needle biopsy exists if surgery is not pursued.  He has a history of heart surgery, complicating his decision-making regarding further surgical interventions. He fears surgery due to past experiences with anesthesia and recovery.  He enjoys target shooting and is concerned about how treatment might interfere with this activity, particularly during his preferred time of year for shooting. Already saw Dr Janella Median Onc, and has appt with Oncology. He was also scheduled to see Dr Vira Grieves 03/19/24 at 3 pm at [facility].    Records Reviewed:  Flexible scope done by me on 02/18/24 when patient was seen inpatient  Procedure: Preoperative diagnosis: Right oropharyngeal mass hemoptysis and right cervical lymphadenopathy glottic airway patent    Postoperative diagnosis:   Same   Procedure: Flexible fiberoptic laryngoscopy   Surgeon: Artice Last, MD   Anesthesia: Topical lidocaine  and Afrin Complications: None Condition is stable throughout exam   Indications and consent:  The patient presents to the clinic with above  symptoms. Indirect laryngoscopy view was incomplete. Thus it was recommended that they undergo a flexible fiberoptic laryngoscopy. All of the risks, benefits, and potential complications were reviewed with the patient preoperatively and verbal informed consent was obtained.   Procedure: The patient was seated upright in the clinic. Topical lidocaine  and Afrin were applied to the nasal cavity. After adequate anesthesia had occurred, I then proceeded to pass the flexible telescope into the nasal cavity. The nasal cavity was patent without rhinorrhea or polyp. The nasopharynx was also patent without mass or lesion. There was evidence of a large exophytic mass involving right tonsil and soft palate but without any obvious involvement of the base of the tongue.  No evidence of blood or clot noted during scope exam, there were no signs of pooling of secretions in the piriform sinuses. The true vocal folds were mobile bilaterally. There were no signs of glottic or supraglottic mucosal lesion or mass. There was moderate interarytenoid pachydermia and post cricoid edema. The telescope was then slowly withdrawn and the patient tolerated the procedure throughout.     Past Medical History:  Diagnosis Date   AICD (automatic cardioverter/defibrillator) present    medtronic-   DR. CROITORU , DR. Mitzie Anda    Anginal pain (HCC)    cp sat 08/11/17   Anxiety    CAD (coronary artery disease)    CHF (congestive heart failure) (HCC)    Complication of anesthesia    took awhile to wake up    Coronary artery disease involving coronary bypass graft    Cyst of neck    right side   DM2 (diabetes mellitus,  type 2) (HCC) 08/26/2013   Dyspnea    Heart attack (HCC)    "not sure when" (08/20/2017)   HTN (hypertension) 08/26/2013   Hyperlipidemia 08/26/2013   Hypothyroidism    Left main coronary artery disease    Left renal artery stenosis (HCC)    Genesis 6x12 stent 2007   Obesity (BMI 30.0-34.9) 08/26/2013    Postoperative atrial fibrillation (HCC) 10/15/2005   Presence of permanent cardiac pacemaker    S/P CABG x 4 10/13/2005   LIMA to LAD, SVG to intermediate branch, sequential SVG to PDA and RPL branch, EVH via right thigh   S/P mitral valve replacement with bioprosthetic valve 07/11/2016   31 mm [facility] Mitral bovine bioprosthetic tissue valve   S/P redo CABG x 2 07/11/2016   SVG to PDA and SVG to Intermediate Branch, EVH via left thigh    Past Surgical History:  Procedure Laterality Date   A-FLUTTER ABLATION N/A 05/11/2017   Procedure: A-Flutter Ablation;  Surgeon: Lei Pump, MD;  Location: [facility] INVASIVE CV LAB;  Service: Cardiovascular;  Laterality: N/A;   BIV ICD GENERATOR CHANGEOUT N/A 08/29/2023   Procedure: BIV ICD GENERATOR CHANGEOUT;  Surgeon: Lei Pump, MD;  Location: [facility] INVASIVE CV LAB;  Service: Cardiovascular;  Laterality: N/A;   CARDIAC CATHETERIZATION N/A 06/21/2016   Procedure: Right/Left Heart Cath and Coronary/Graft Angiography;  Surgeon: Arnoldo Lapping, MD;  Location: [facility] INVASIVE CV LAB;  Service: Cardiovascular;  Laterality: N/A;   CARDIAC VALVE REPLACEMENT     CARDIOVERSION N/A 07/19/2016   Procedure: CARDIOVERSION;  Surgeon: Lenise Quince, MD;  Location: [facility] ENDOSCOPY;  Service: Cardiovascular;  Laterality: N/A;   CARDIOVERSION N/A 09/08/2016   Procedure: CARDIOVERSION;  Surgeon: Elmyra Haggard, MD;  Location: [facility] ENDOSCOPY;  Service: Cardiovascular;  Laterality: N/A;   CORONARY ANGIOPLASTY     STENT 2016  [facility]   CORONARY ANGIOPLASTY WITH STENT PLACEMENT     DES in SVG to right coronary artery system   CORONARY ARTERY BYPASS GRAFT  10/13/2005   LIMA to LAD, SVG to intermediate branch, sequential SVG to PDA and RPL   CORONARY ARTERY BYPASS GRAFT N/A 07/11/2016   Procedure: REDO CORONARY ARTERY BYPASS GRAFTING (CABG) x two using left leg greater saphenous vein harvested endoscopically-SVG to PDA -SVG to RAMUS INTERMEDIATE;  Surgeon:  Gardenia Jump, MD;  Location: [facility] OR;  Service: Open Heart Surgery;  Laterality: N/A;   CORONARY ARTERY BYPASS GRAFT N/A 07/11/2016   Procedure: Re-exploration (CABG) for post op bleeding,;  Surgeon: Gardenia Jump, MD;  Location: [facility] OR;  Service: Open Heart Surgery;  Laterality: N/A;   EP IMPLANTABLE DEVICE N/A 07/25/2016   Procedure: BiV ICD Insertion CRT-D;  Surgeon: Will Cortland Ding, MD;  Location: [facility] INVASIVE CV LAB;  Service: Cardiovascular;  Laterality: N/A;   INGUINAL HERNIA REPAIR Left 08/20/2017   INGUINAL HERNIA REPAIR Left 08/20/2017   Procedure: OPEN LEFT INGUINAL HERNIA REPAIR;  Surgeon: Juanita Norlander, MD;  Location: [facility] OR;  Service: General;  Laterality: Left;   LEFT HEART CATH AND CORS/GRAFTS ANGIOGRAPHY N/A 12/18/2016   Procedure: Left Heart Cath and Cors/Grafts Angiography;  Surgeon: Arnoldo Lapping, MD;  Location: [facility] INVASIVE CV LAB;  Service: Cardiovascular;  Laterality: N/A;   LEFT HEART CATH AND CORS/GRAFTS ANGIOGRAPHY N/A 08/22/2017   Procedure: LEFT HEART CATH AND CORS/GRAFTS ANGIOGRAPHY;  Surgeon: Darlis Eisenmenger, MD;  Location: [facility] INVASIVE CV LAB;  Service: Cardiovascular;  Laterality: N/A;   MITRAL VALVE REPLACEMENT N/A 07/11/2016  Procedure: MITRAL VALVE (MV) REPLACEMENT;  Surgeon: Gardenia Jump, MD;  Location: [facility] OR;  Service: Open Heart Surgery;  Laterality: N/A;   MYOCARDICAL PERFUSION  10/09/2007   NORMAL PERFUSION IN ALL REGIONS;NO EVIDENCE OF INDUCIBLE ISCHEMIA;POST STRESS EF% 66   RENAL ARTERY STENT Right 2007   RENAL DOPPLER  03/28/2010   RIGHT RA-NORMAL;LEFT PROXIMAL RA AT STENT-PATENT WITH NO EVIDENCE OF SIGN DIAMETER REDUCTION. R & L KIDNEYS: EQUAL IN SIZE,SYMMETRICAL IN SHAPE.   RIGHT HEART CATH N/A 01/08/2020   Procedure: RIGHT HEART CATH;  Surgeon: Darlis Eisenmenger, MD;  Location: [facility] INVASIVE CV LAB;  Service: Cardiovascular;  Laterality: N/A;   TEE WITHOUT CARDIOVERSION N/A 06/15/2016   Procedure: TRANSESOPHAGEAL ECHOCARDIOGRAM (TEE);  Surgeon: Luana Rumple, MD;  Location: [facility] ENDOSCOPY;  Service: Cardiovascular;  Laterality: N/A;   TEE WITHOUT CARDIOVERSION N/A 07/11/2016   Procedure: TRANSESOPHAGEAL ECHOCARDIOGRAM (TEE);  Surgeon: Gardenia Jump, MD;  Location: [facility] OR;  Service: Open Heart Surgery;  Laterality: N/A;   TEE WITHOUT CARDIOVERSION N/A 07/19/2016   Procedure: TRANSESOPHAGEAL ECHOCARDIOGRAM (TEE);  Surgeon: Lenise Quince, MD;  Location: [facility] ENDOSCOPY;  Service: Cardiovascular;  Laterality: N/A;   TRANSESOPHAGEAL ECHOCARDIOGRAM  10/19/2005   NORMAL LV; MILD TO MODERATE AMOUNT OF SOFT ATHEROMATOUS PLAQUE OF THE THORACIC AORTA; THE LEFT ATRIUM IS MILDLY DILATED;LEFT ATRIAL APPENDAGE FUNCTION IS NORMAL;NO THROMBUS IDENTIFIED. SMALL PFO WITH RIGHT TO LEFT SHUNT    Family History  Problem Relation Age of Onset   Heart attack Mother    Heart disease Mother    Heart attack Father    Heart disease Father    Hypertension Father    Hypertension Sister    Heart disease Maternal Grandmother     Social History:  reports that he has never smoked. He has never used smokeless tobacco. He reports that he does not drink alcohol and does not use drugs.  Allergies:  Allergies  Allergen Reactions   Xanax [Alprazolam] Other (See Comments)    Pt feels very weak, tired and feels paralyzed      Medications: I have reviewed the patient's current medications.  The PMH, PSH, Medications, Allergies, and SH were reviewed and updated.  ROS: Constitutional: Negative for fever, weight loss and weight gain. Cardiovascular: Negative for chest pain and dyspnea on exertion. Respiratory: Is not experiencing shortness of breath at rest. Gastrointestinal: Negative for nausea and vomiting. Neurological: Negative for headaches. Psychiatric: The patient is not nervous/anxious  Blood pressure 128/87, pulse 84, SpO2 96%. There is no height or weight on file to calculate BMI.  PHYSICAL EXAM:  Exam: General: Well-developed,  well-nourished Communication and Voice: Clear pitch and clarity Respiratory Respiratory effort: Equal inspiration and expiration without stridor Cardiovascular Peripheral Vascular: Warm extremities with equal color/perfusion Eyes: No nystagmus with equal extraocular motion bilaterally Neuro/Psych/Balance: Patient oriented to person, place, and time; Appropriate mood and affect; Gait is intact with no imbalance; Cranial nerves I-XII are intact Head and Face Inspection: Normocephalic and atraumatic without mass or lesion Palpation: Facial skeleton intact without bony stepoffs Salivary Glands: No mass or tenderness Facial Strength: Facial motility symmetric and full bilaterally ENT Pinna: External ear intact and fully developed External canal: Canal is patent with intact skin Tympanic Membrane: Clear and mobile External Nose: No scar or anatomic deformity Internal Nose: Septum is relatively straight on anterior rhinoscopy. No polyp, or purulence. Mucosal edema and erythema present.  Bilateral inferior turbinate hypertrophy.  Lips, Teeth, and gums: Mucosa and teeth intact and viable TMJ: No pain  to palpation with full mobility Oral cavity/oropharynx: No erythema or exudate, no lesions present. Previously seen Right tonsillar mass, biopsy site without bleeding No trismus  Neck Neck and Trachea: Midline trachea without mass or lesion Thyroid : No mass or nodularity Lymphatics: Large 8 cm right neck mass, soft, mobile no skin changes    Studies Reviewed: CT neck 02/18/24 IMPRESSION: 1. 3.7 cm mass centered at the right palatine tonsil/pharynx and extending inferiorly to the glossotonsillar sulcus. The mass likely also involves the soft palate. This most consistent with a mucosal/submucosal neoplasm (likely squamous cell carcinoma). Resultant mild-to-moderate effacement of the oropharyngeal airway. ENT referral and direct visualization recommended. 2. 6.5 x 4.0 cm predominantly low  attenuation lesion centered at the right level 2 station, concerning for a cystic/necrotic lymph node (from nodal metastatic disease) given findings described in impression #1. 3. Nonspecific 5 mm cystic-appearing lesion within the vallecula on the right. Direct visualization recommended. 4. Partially imaged forehead lipoma, measuring at least 12 x 5 mm. 5. Mild T1 superior endplate vertebral compression deformity, new from the prior chest CT of 11/01/2022 but otherwise age-indeterminate.  CT chest 02/18/24 IMPRESSION: 1. No acute findings within the chest. No explanation for patient's hemoptysis within the chest. 2. Mild diffuse bronchial wall thickening is nonspecific, but can be seen in the setting of bronchitis. 3. New 5 mm nodule within the posterior and medial left lower lobe. If the patient is at high risk for bronchogenic carcinoma, follow-up chest CT at 1year is recommended. If the patient is at low risk, no follow-up is needed. This recommendation follows the consensus statement: Guidelines for Management of Small Pulmonary Nodules Detected on CT Scans: A Statement from the Fleischner Society as published in Radiology 2005; 237:395-400. 4.  Aortic Atherosclerosis  PET/CT 02/28/24 IMPRESSION: Primary lesion along the right palatine tonsil region is hypermetabolic consistent with history of squamous cell carcinoma. No additional areas of abnormal uptake at this time to suggest spread of disease.   These large cystic lesion in the right neck does not have any abnormal uptake. Although in the setting of a known malignancy in the neck, cystic nodal metastasis without uptake is technically possible as felt to be less likely. Another possibility would be a type 2 branchial cleft cyst but again there is no clear neck extending between the ICA and ECA. Please correlate for any known history. Any prior imaging of the area may be useful to assess for chronicity. Please correlate  with any particular symptoms. A neck MRI without contrast may be of some benefit but the patient does have a defibrillator. Please correlate for MRI clearance.   Small lung nodule seen previously are not well seen on the current examination but there is significant motion. Follow up as per prior recommendations.   Postop chest with defibrillator. Enlarged heart. Mild increased uptake seen along an area of nodularity along the left side of the upper mediastinum but this area has been present since 2023.   Colonic diverticula. Renal atrophy. Nonobstructing renal stone. Other ancillary findings as above  Pathology 02/18/24 GROSS DESCRIPTION:   A. Received in saline labeled with the patient's name and DOB is a 1.8 x  1.8 x 0.4 cm aggregate of red-tan, rubbery soft tissue fragments. A  piece is placed in RPMI and sent to [facility] for possible  flow cytometry and the remaining tissue is submitted in toto in two  cassettes for routine histology.   Assessment & Plan Tonsillar cancer, P16 positive SCCa  Primary  cancer of tonsillar fossa (HCC) Staging form: Pharynx - HPV-Mediated Oropharynx, AJCC 8th Edition - Clinical stage from 03/04/2024: Stage I (cT2, cN0, cM0, p16+) - Signed by Pearlene Bouchard, PA-C on 03/04/2024 Stage prefix: Initial diagnosis   Stage I (cT2, N0, M0) versus (cT2, N3, M0) squamous cell carcinoma of the right tonsil, p16+   Staged as potentially amenable to surgery based on imaging and p16 status. Responsive to chemoradiation due to P16 positivity. Large neck mass did not light up on PET scan, suggesting benign nature, but differential includes metastatic node. Surgery remains viable despite anesthesia risks from low ejection fraction, but he would need cardiology clearance. Chemoradiation is an alternative if surgery is not pursued. Surgery may require follow-up chemoradiation based on pathology. We also discussed potential neck mass excision which he will discuss  with Dr Vira Grieves on 03/19/24 (has appt at 3 pm), and this will help clarify if his right neck mass represents mets. We also discussed doing needle biopsy if surgery is not an option.  - Proceed with appointment with Dr. Vira Grieves at [facility] on June 18th at 3 PM for surgical evaluation. - Consider needle biopsy of neck mass if surgery is not pursued. - he is already established with Rad Onc and has appt to see Medical Oncology - Continue with scheduled appointments for radiation oncology and oncology consultations.  Low ejection fraction hx of heart disease  Presents a concern for anesthesia risks during potential surgery for tonsillar cancer. May influence surgical candidacy. - Evaluate surgical candidacy considering low ejection fraction - will need cardiology clearance    Thank you for allowing me to participate in the care of this patient. Please do not hesitate to contact me with any questions or concerns.   Artice Last, MD Otolaryngology [facility] Phone: [phone] Fax: [phone]    03/06/2024, 11:34 AM

## 2024-03-06 NOTE — Progress Notes (Signed)
 Remote ICD transmission.

## 2024-03-10 ENCOUNTER — Ambulatory Visit: Attending: Cardiology

## 2024-03-10 DIAGNOSIS — I5042 Chronic combined systolic (congestive) and diastolic (congestive) heart failure: Secondary | ICD-10-CM | POA: Diagnosis not present

## 2024-03-10 DIAGNOSIS — Z95810 Presence of automatic (implantable) cardiac defibrillator: Secondary | ICD-10-CM | POA: Diagnosis not present

## 2024-03-13 ENCOUNTER — Ambulatory Visit: Payer: Medicare Other | Admitting: "Endocrinology

## 2024-03-13 ENCOUNTER — Ambulatory Visit: Admitting: Oncology

## 2024-03-13 ENCOUNTER — Encounter: Payer: Self-pay | Admitting: "Endocrinology

## 2024-03-13 ENCOUNTER — Other Ambulatory Visit

## 2024-03-13 VITALS — BP 112/66 | HR 68 | Ht 72.0 in | Wt 194.0 lb

## 2024-03-13 DIAGNOSIS — N1831 Chronic kidney disease, stage 3a: Secondary | ICD-10-CM

## 2024-03-13 DIAGNOSIS — I10 Essential (primary) hypertension: Secondary | ICD-10-CM | POA: Diagnosis not present

## 2024-03-13 DIAGNOSIS — E039 Hypothyroidism, unspecified: Secondary | ICD-10-CM | POA: Diagnosis not present

## 2024-03-13 DIAGNOSIS — E1122 Type 2 diabetes mellitus with diabetic chronic kidney disease: Secondary | ICD-10-CM | POA: Diagnosis not present

## 2024-03-13 DIAGNOSIS — E782 Mixed hyperlipidemia: Secondary | ICD-10-CM | POA: Diagnosis not present

## 2024-03-13 DIAGNOSIS — Z7984 Long term (current) use of oral hypoglycemic drugs: Secondary | ICD-10-CM

## 2024-03-13 MED ORDER — ACCU-CHEK GUIDE TEST VI STRP: ORAL_STRIP | 2 refills | Status: DC

## 2024-03-13 MED ORDER — GLIPIZIDE ER 2.5 MG PO TB24: 2.5000 mg | ORAL_TABLET | Freq: Every day | ORAL | 1 refills | Status: DC

## 2024-03-13 MED ORDER — LEVOTHYROXINE SODIUM 50 MCG PO TABS: 50.0000 ug | ORAL_TABLET | Freq: Every day | ORAL | 1 refills | Status: DC

## 2024-03-13 NOTE — Progress Notes (Signed)
 TO BE COMPLETED BY RADIATION ONCOLOGIST OFFICE:   Patient Name: Christian Bowman   Date of Birth: 25-Jun-1946   Radiation Oncologist: Dr. Lurena Sally   Site to be Treated: Right Tonsil  Will x-rays >10 MV be used? No  Will the radiation be >10 cm from the device? No  Planned Treatment Start Date: TBD  TO BE COMPLETED BY CARDIOLOGIST OFFICE:   Device Information:  Pacemaker []      ICD [x]    Brand: Medtronic: [phone] Model #: Medtronic [id number] Cobalt XT HF Quad CRT-D MRI  Serial Number: [id number]     Date of Placement: 08/29/2023  Site of Placement: Chest  Remote Device Check--Frequency: Every 91 days  Last Check: 03/09/2024  Is the Patient Pacer Dependent?:  Yes [x]   No []   Does cardiologist request Radiation Oncology to schedule device testing by vendor for the following:  Prior to the Initiation of Treatments?  Yes []  No [x]  During Treatments?  Yes []  No [x]  Post Radiation Treatments?  Yes [x]  No []   Is device monitoring necessary by vendor/cardiologist team during treatments?  Yes []   No [x]   Is cardiac monitoring by Radiation Oncology nursing necessary during treatments? Yes [x]   No []   Do you recommend device be relocated prior to Radiation Treatment? Yes []   No [x]   Place shield over ICD during radiation treatments.  **PLEASE LIST ANY NOTES OR SPECIAL REQUESTS: Verbal Consent Obtained from Dr. Lawana Pray, fine to proceed with radiation.     CARDIOLOGIST SIGNATURE:  Dr. Agatha Horsfall Per Device Clinic Standing Orders, [name]  03/13/2024 11:53 AM  **Please route completed form back to Radiation Oncology Nursing and P [facility] RAD ONC ADMIN, OR send an update if there will be a delay in having form completed by expected start date.  **Call [phone] if you have any questions or do not get an in-basket response from a Radiation Oncology staff member

## 2024-03-13 NOTE — Progress Notes (Signed)
 03/13/2024          Endocrinology follow-up note   Subjective:    Patient ID: Christian Bowman, male    DOB: [birth year],    Past Medical History:  Diagnosis Date   AICD (automatic cardioverter/defibrillator) present    medtronic-   DR. CROITORU , DR. Mitzie Anda    Anginal pain (HCC)    cp sat 08/11/17   Anxiety    CAD (coronary artery disease)    CHF (congestive heart failure) (HCC)    Complication of anesthesia    took awhile to wake up    Coronary artery disease involving coronary bypass graft    Cyst of neck    right side   DM2 (diabetes mellitus, type 2) (HCC) 08/26/2013   Dyspnea    Heart attack (HCC)    not sure when (08/20/2017)   HTN (hypertension) 08/26/2013   Hyperlipidemia 08/26/2013   Hypothyroidism    Left main coronary artery disease    Left renal artery stenosis (HCC)    Genesis 6x12 stent 2007   Localized cancer of throat (HCC)    Obesity (BMI 30.0-34.9) 08/26/2013   Postoperative atrial fibrillation (HCC) 10/15/2005   Presence of permanent cardiac pacemaker    S/P CABG x 4 10/13/2005   LIMA to LAD, SVG to intermediate branch, sequential SVG to PDA and RPL branch, EVH via right thigh   S/P mitral valve replacement with bioprosthetic valve 07/11/2016   31 mm [facility] Mitral bovine bioprosthetic tissue valve   S/P redo CABG x 2 07/11/2016   SVG to PDA and SVG to Intermediate Branch, EVH via left thigh   Past Surgical History:  Procedure Laterality Date   A-FLUTTER ABLATION N/A 05/11/2017   Procedure: A-Flutter Ablation;  Surgeon: Lei Pump, MD;  Location: [facility] INVASIVE CV LAB;  Service: Cardiovascular;  Laterality: N/A;   BIV ICD GENERATOR CHANGEOUT N/A 08/29/2023   Procedure: BIV ICD GENERATOR CHANGEOUT;  Surgeon: Lei Pump, MD;  Location: [facility] INVASIVE CV LAB;  Service: Cardiovascular;  Laterality: N/A;   CARDIAC CATHETERIZATION N/A 06/21/2016   Procedure: Right/Left Heart Cath and Coronary/Graft Angiography;  Surgeon: Arnoldo Lapping,  MD;  Location: [facility] INVASIVE CV LAB;  Service: Cardiovascular;  Laterality: N/A;   CARDIAC VALVE REPLACEMENT     CARDIOVERSION N/A 07/19/2016   Procedure: CARDIOVERSION;  Surgeon: Lenise Quince, MD;  Location: [facility] ENDOSCOPY;  Service: Cardiovascular;  Laterality: N/A;   CARDIOVERSION N/A 09/08/2016   Procedure: CARDIOVERSION;  Surgeon: Elmyra Haggard, MD;  Location: [facility] ENDOSCOPY;  Service: Cardiovascular;  Laterality: N/A;   CORONARY ANGIOPLASTY     STENT 2016  [facility]   CORONARY ANGIOPLASTY WITH STENT PLACEMENT     DES in SVG to right coronary artery system   CORONARY ARTERY BYPASS GRAFT  10/13/2005   LIMA to LAD, SVG to intermediate branch, sequential SVG to PDA and RPL   CORONARY ARTERY BYPASS GRAFT N/A 07/11/2016   Procedure: REDO CORONARY ARTERY BYPASS GRAFTING (CABG) x two using left leg greater saphenous vein harvested endoscopically-SVG to PDA -SVG to RAMUS INTERMEDIATE;  Surgeon: Gardenia Jump, MD;  Location: [facility] OR;  Service: Open Heart Surgery;  Laterality: N/A;   CORONARY ARTERY BYPASS GRAFT N/A 07/11/2016   Procedure: Re-exploration (CABG) for post op bleeding,;  Surgeon: Gardenia Jump, MD;  Location: [facility] OR;  Service: Open Heart Surgery;  Laterality: N/A;   EP IMPLANTABLE DEVICE N/A 07/25/2016   Procedure: BiV ICD Insertion CRT-D;  Surgeon: Will Gaylyn Keas  Camnitz, MD;  Location: [facility] INVASIVE CV LAB;  Service: Cardiovascular;  Laterality: N/A;   INGUINAL HERNIA REPAIR Left 08/20/2017   INGUINAL HERNIA REPAIR Left 08/20/2017   Procedure: OPEN LEFT INGUINAL HERNIA REPAIR;  Surgeon: Juanita Norlander, MD;  Location: [facility] OR;  Service: General;  Laterality: Left;   LEFT HEART CATH AND CORS/GRAFTS ANGIOGRAPHY N/A 12/18/2016   Procedure: Left Heart Cath and Cors/Grafts Angiography;  Surgeon: Arnoldo Lapping, MD;  Location: [facility] INVASIVE CV LAB;  Service: Cardiovascular;  Laterality: N/A;   LEFT HEART CATH AND CORS/GRAFTS ANGIOGRAPHY N/A 08/22/2017   Procedure: LEFT HEART CATH AND  CORS/GRAFTS ANGIOGRAPHY;  Surgeon: Darlis Eisenmenger, MD;  Location: [facility] INVASIVE CV LAB;  Service: Cardiovascular;  Laterality: N/A;   MITRAL VALVE REPLACEMENT N/A 07/11/2016   Procedure: MITRAL VALVE (MV) REPLACEMENT;  Surgeon: Gardenia Jump, MD;  Location: [facility] OR;  Service: Open Heart Surgery;  Laterality: N/A;   MYOCARDICAL PERFUSION  10/09/2007   NORMAL PERFUSION IN ALL REGIONS;NO EVIDENCE OF INDUCIBLE ISCHEMIA;POST STRESS EF% 66   RENAL ARTERY STENT Right 2007   RENAL DOPPLER  03/28/2010   RIGHT RA-NORMAL;LEFT PROXIMAL RA AT STENT-PATENT WITH NO EVIDENCE OF SIGN DIAMETER REDUCTION. R & L KIDNEYS: EQUAL IN SIZE,SYMMETRICAL IN SHAPE.   RIGHT HEART CATH N/A 01/08/2020   Procedure: RIGHT HEART CATH;  Surgeon: Darlis Eisenmenger, MD;  Location: [facility] INVASIVE CV LAB;  Service: Cardiovascular;  Laterality: N/A;   TEE WITHOUT CARDIOVERSION N/A 06/15/2016   Procedure: TRANSESOPHAGEAL ECHOCARDIOGRAM (TEE);  Surgeon: Luana Rumple, MD;  Location: [facility] ENDOSCOPY;  Service: Cardiovascular;  Laterality: N/A;   TEE WITHOUT CARDIOVERSION N/A 07/11/2016   Procedure: TRANSESOPHAGEAL ECHOCARDIOGRAM (TEE);  Surgeon: Gardenia Jump, MD;  Location: [facility] OR;  Service: Open Heart Surgery;  Laterality: N/A;   TEE WITHOUT CARDIOVERSION N/A 07/19/2016   Procedure: TRANSESOPHAGEAL ECHOCARDIOGRAM (TEE);  Surgeon: Lenise Quince, MD;  Location: [facility] ENDOSCOPY;  Service: Cardiovascular;  Laterality: N/A;   TRANSESOPHAGEAL ECHOCARDIOGRAM  10/19/2005   NORMAL LV; MILD TO MODERATE AMOUNT OF SOFT ATHEROMATOUS PLAQUE OF THE THORACIC AORTA; THE LEFT ATRIUM IS MILDLY DILATED;LEFT ATRIAL APPENDAGE FUNCTION IS NORMAL;NO THROMBUS IDENTIFIED. SMALL PFO WITH RIGHT TO LEFT SHUNT   Social History   Socioeconomic History   Marital status: Married    Spouse name: Not on file   Number of children: 0   Years of education: 18   Highest education level: Not on file  Occupational History   Occupation: retired  Tobacco Use   Smoking status: Never    Smokeless tobacco: Never  Vaping Use   Vaping status: Never Used  Substance and Sexual Activity   Alcohol use: No   Drug use: No   Sexual activity: Never  Other Topics Concern   Not on file  Social History Narrative   Right handed   Two story home   Drinks half and half coffee   Social Drivers of Health   Financial Resource Strain: Not on file  Food Insecurity: No Food Insecurity (03/04/2024)   Hunger Vital Sign    Worried About Running Out of Food in the Last Year: Never true    Ran Out of Food in the Last Year: Never true  Transportation Needs: No Transportation Needs (03/04/2024)   PRAPARE - Administrator, Civil Service (Medical): No    Lack of Transportation (Non-Medical): No  Physical Activity: Not on file  Stress: Not on file  Social Connections: Moderately Integrated (02/18/2024)   Social Connection and Isolation  Panel    Frequency of Communication with Friends and Family: Once a week    Frequency of Social Gatherings with Friends and Family: Twice a week    Attends Religious Services: More than 4 times per year    Active Member of [employer] or Organizations: No    Attends Banker Meetings: Never    Marital Status: Married   Outpatient Encounter Medications as of 03/13/2024  Medication Sig   glucose blood (ACCU-CHEK GUIDE TEST) test strip Use as instructed   acetaminophen  (TYLENOL ) 325 MG tablet Take 325 mg by mouth every 6 (six) hours as needed for mild pain (pain score 1-3).   apixaban  (ELIQUIS ) 5 MG TABS tablet Take 1 tablet (5 mg total) by mouth 2 (two) times daily.   Blood Glucose Monitoring Suppl (ACCU-CHEK GUIDE ME) w/Device KIT 1 Piece by Does not apply route as directed.   carvedilol  (COREG ) 12.5 MG tablet TAKE 1 & 1/2 (ONE & ONE-HALF) TABLETS BY MOUTH TWICE DAILY WITH A MEAL   Cholecalciferol  (VITAMIN D ) 50 MCG (2000 UT) tablet Take 2,000 Units by mouth daily with breakfast.   dapagliflozin  propanediol (FARXIGA ) 10 MG TABS tablet Take 1  tablet (10 mg total) by mouth daily.   digoxin  (LANOXIN ) 0.125 MG tablet Take 1/2 (one-half) tablet by mouth once daily   ENTRESTO  97-103 MG Take 1 tablet by mouth twice daily   eplerenone  (INSPRA ) 50 MG tablet Take 1 tablet (50 mg total) by mouth daily.   furosemide  (LASIX ) 20 MG tablet Take 1 tablet (20 mg total) by mouth daily as needed (for a weight over 190 lbs).   glipiZIDE  (GLUCOTROL  XL) 2.5 MG 24 hr tablet Take 1 tablet (2.5 mg total) by mouth daily with breakfast.   levothyroxine  (SYNTHROID ) 50 MCG tablet Take 1 tablet (50 mcg total) by mouth daily before breakfast.   neomycin-bacitracin-polymyxin (NEOSPORIN) ointment Apply 1 application  topically daily as needed for wound care.   nitroGLYCERIN  (NITROSTAT ) 0.4 MG SL tablet DISSOLVE 1 TABLET UNDER THE TONGUE EVERY 5 MINUTES AS NEEDED FOR CHEST PAIN, DO NOT EXCEED 3 DOSES IN 15 MINUTES   polyethylene glycol (MIRALAX ) 17 g packet Take 17 g by mouth daily as needed for mild constipation or moderate constipation.   rosuvastatin  (CRESTOR ) 10 MG tablet Take 1 tablet (10 mg total) by mouth daily.   sodium zirconium cyclosilicate  (LOKELMA ) 10 g PACK packet DISSOLVE 1 PACKET IN WATER & DRINK ONCE DAILY   Tetrahydrozoline HCl (VISINE OP) Place 1 drop into both eyes daily as needed (irritation).   [DISCONTINUED] amoxicillin  (AMOXIL ) 500 MG tablet Take 4 tablets (2,000 mg total) by mouth once as needed for up to 1 dose (As needed for dentla procedures; take 2000 mg 1 hour prior to dental work). Take 2000 mg 1 hour prior to dental work   [DISCONTINUED] carvedilol  (COREG ) 12.5 MG tablet TAKE 1 & 1/2 (ONE & ONE-HALF) TABLETS BY MOUTH TWICE DAILY WITH A MEAL   [DISCONTINUED] clopidogrel  (PLAVIX ) 75 MG tablet Take 1 tablet (75 mg total) by mouth daily.   [DISCONTINUED] glipiZIDE  (GLUCOTROL  XL) 2.5 MG 24 hr tablet Take 1 tablet (2.5 mg total) by mouth daily with breakfast.   [DISCONTINUED] glucose blood (ACCU-CHEK GUIDE) test strip Use  to monitor glucose  once a day at fasting.   [DISCONTINUED] levothyroxine  (SYNTHROID ) 75 MCG tablet Take 1 tablet (75 mcg total) by mouth daily before breakfast. (Patient taking differently: Take 37.5 mcg by mouth daily before breakfast.)   [DISCONTINUED] levothyroxine  (SYNTHROID )  75 MCG tablet Take 0.5 tablets (37.5 mcg total) by mouth daily before breakfast.   No facility-administered encounter medications on file as of 03/13/2024.   ALLERGIES: Allergies  Allergen Reactions   Xanax [Alprazolam] Other (See Comments)    Pt feels very weak, tired and feels paralyzed     VACCINATION STATUS: Immunization History  Administered Date(s) Administered   Fluad Quad(high Dose 65+) 07/28/2019   Influenza,inj,Quad PF,6+ Mos 08/01/2016, 07/11/2017   Pneumococcal Polysaccharide-23 07/02/2015    Diabetes He presents for his follow-up diabetic visit. He has type 2 diabetes mellitus. Onset time: He was diagnosed at approximate age of 50 years. His disease course has been stable. There are no hypoglycemic associated symptoms. Pertinent negatives for hypoglycemia include no confusion, headaches, pallor or seizures. There are no diabetic associated symptoms. Pertinent negatives for diabetes include no chest pain, no fatigue, no polydipsia, no polyphagia, no polyuria and no weakness. There are no hypoglycemic complications. Symptoms are stable. Diabetic complications include heart disease. Risk factors for coronary artery disease include diabetes mellitus, dyslipidemia, hypertension, male sex and sedentary lifestyle. He is compliant with treatment most of the time. His weight is fluctuating minimally. He participates in exercise intermittently. There is no change in his home blood glucose trend. His breakfast blood glucose range is generally 110-130 mg/dl. His overall blood glucose range is 110-130 mg/dl. (He did bring his logs showing controlled glycemic profile at fasting.  He has no hypoglycemia documented.  His previsit labs showed  A1c of 6.6%.    ) An ACE inhibitor/angiotensin II receptor blocker is being taken.  Hyperlipidemia This is a chronic problem. The current episode started more than 1 year ago. Exacerbating diseases include diabetes. Pertinent negatives include no chest pain, myalgias or shortness of breath. Current antihyperlipidemic treatment includes statins. Risk factors for coronary artery disease include diabetes mellitus, dyslipidemia, hypertension and male sex.  Hypertension This is a chronic problem. The current episode started more than 1 year ago. Pertinent negatives include no chest pain, headaches, neck pain, palpitations or shortness of breath. Risk factors for coronary artery disease include diabetes mellitus and dyslipidemia. Hypertensive end-organ damage includes CAD/MI. Identifiable causes of hypertension include a thyroid  problem.  Thyroid  Problem Presents for follow-up visit. Patient reports no constipation, diarrhea, fatigue or palpitations. (Remains on levothyroxine  75 mcg p.o. nightly.  He is compliant.  He has no new complaints today.) The symptoms have been stable. His past medical history is significant for diabetes and hyperlipidemia.    Review of systems  Constitutional: + Steady body weight 200 pounds,   Body mass index is 26.31 kg/m. , no fatigue, no subjective hyperthermia, no subjective hypothermia    Objective:    BP 112/66   Pulse 68   Ht 6' (1.829 m)   Wt 194 lb (88 kg)   BMI 26.31 kg/m   Wt Readings from Last 3 Encounters:  03/13/24 194 lb (88 kg)  03/04/24 193 lb 12.8 oz (87.9 kg)  02/20/24 191 lb 9.6 oz (86.9 kg)     Physical Exam- Limited  Constitutional:  Body mass index is 26.31 kg/m. , not in acute distress, normal state of mind Eyes:  EOMI, no exophthalmos   Chemistry (most recent): Lab Results  Component Value Date   NA 139 02/19/2024   K 4.4 02/19/2024   CL 107 02/19/2024   CO2 25 02/19/2024   BUN 30 (H) 02/19/2024   CREATININE 1.75 (H)  02/19/2024     Lipid Panel     Component  Value Date/Time   CHOL 125 02/28/2024 0812   TRIG 104 02/28/2024 0812   HDL 36 (L) 02/28/2024 0812   CHOLHDL 3.5 02/28/2024 0812   CHOLHDL 3.5 05/31/2022 1142   VLDL 16 05/31/2022 1142   LDLCALC 70 02/28/2024 0812   LABVLDL 19 02/28/2024 0812   A1c today 7.3%.   Latest Reference Range & Units 08/21/23 13:06 08/21/23 13:07 09/13/23 14:10  Glucose 70 - 99 mg/dL 97 98   WGN5A, POC (controlled diabetic range) 0.0 - 7.0 %   6.7  TSH 0.450 - 4.500 uIU/mL 2.250    T4,Free(Direct) 0.82 - 1.77 ng/dL 2.13     Assessment & Plan:   1. Type 2 diabetes mellitus with other circulatory complication (HCC), stage 3 CKD   His diabetes is  complicated by coronary artery disease status post coronary artery bypass graft and recent ACS and patient remains at a high risk for more acute and chronic complications of diabetes which include CAD, CVA, CKD, retinopathy, and neuropathy. These are all discussed in detail with the patient.   In the interval, he was diagnosed with tonsillar squamous cell carcinoma.  He is being considered for treatment modalities at this time. He did bring his logs showing controlled glycemic profile at fasting.  He has no hypoglycemia documented.  His previsit labs showed A1c of 6.6%.    -His recent CMP showed continued renal insufficiency- does have history of stage 2-3 renal insufficiency. - Suggestion is made for him to avoid simple carbohydrates  from his diet including Cakes, Sweet Desserts, Ice Cream, Soda (diet and regular), Sweet Tea, Candies, Chips, Cookies, Store Bought Juices, Alcohol in Excess of  1-2 drinks a day, Artificial Sweeteners,  Coffee Creamer, and Sugar-free Products, Lemonade. This will help patient to have more stable blood glucose profile and potentially avoid unintended weight gain.  - Patient is advised to stick to a routine mealtimes to eat 3 meals  a day and avoid unnecessary snacks ( to snack only to  correct hypoglycemia).   - I have approached patient with the following individualized plan to manage diabetes and patient agrees.  -He has responded to his current regimen, advised to continue glipizide  2.5 mg XL p.o. daily at breakfast Farxiga  10 mg p.o. daily at breakfast.  He is advised and willing to monitor blood glucose at least once a day before breakfast and    He is advised to call clinic for hypoglycemia under 70 or hyperglycemia above 200 mg per DL at fasting.     - Patient specific target  for A1c; LDL, HDL, Triglycerides, were discussed in detail.  2) BP/HTN -His blood pressure is controlled to target.  He is currently on carvedilol  12.5 mg p.o. twice daily, eplerenone  50 mg p.o. daily, as well as Entresto  97-103 mg p.o. daily.   He could be considered for low-dose of Entresto .  3) Lipids/HPL: His most recent lipid panel showed LDL of 76.  He is advised to continue Crestor  10 mg p.o. nightly.   Side effects and precautions discussed with him.    4)  Weight/Diet: His BMI is 26.12-he is not a candidate for major weight loss.   CDE consult in progress, exercise, and carbohydrates information provided.  5) hypothyroidism: - This is related to his therapy with amiodarone . His previsit thyroid  function tests are consistent with under replacement.  I discussed and increase his levothyroxine  to 50 mcg p.o. daily before breakfast.     - We discussed about the correct intake of  his thyroid  hormone, on empty stomach at fasting, with water, separated by at least 30 minutes from breakfast and other medications,  and separated by more than 4 hours from calcium , iron, multivitamins, acid reflux medications (PPIs). -Patient is made aware of the fact that thyroid  hormone replacement is needed for life, dose to be adjusted by periodic monitoring of thyroid  function tests.    6) vitamin D  deficiency: He has responded to vitamin D  supplement with correction of his 25-hydroxy vitamin D  to 40.   He is advised to continue vitamin D3 2000 units daily.   7) Chronic Care/Health Maintenance:  -Patient is  on ACEI/ARB and Statin medications and encouraged to continue to follow up with Ophthalmology, Podiatrist at least yearly or according to recommendations, and advised to  stay away from smoking. I have recommended yearly flu vaccine and pneumonia vaccination at least every 5 years, and  sleep for at least 7 hours a day.  This patient may benefit from screening bone density.  I advised patient to maintain close follow up with his cardiologist and his  PCP for primary care needs.   I spent  26  minutes in the care of the patient today including review of labs from Thyroid  Function, CMP, and other relevant labs ; imaging/biopsy records (current and previous including abstractions from other facilities); face-to-face time discussing  his lab results and symptoms, medications doses, his options of short and long term treatment based on the latest standards of care / guidelines;   and documenting the encounter.  Christian Bowman  participated in the discussions, expressed understanding, and voiced agreement with the above plans.  All questions were answered to his satisfaction. he is encouraged to contact clinic should he have any questions or concerns prior to his return visit.   Follow up plan: Return in about 6 months (around 09/12/2024) for F/U with Pre-visit Labs, A1c -NV.  Kalvin Orf, MD Phone: [phone]  Fax: [phone]   This note was partially dictated with voice recognition software. Similar sounding words can be transcribed inadequately or may not  be corrected upon review.  03/13/2024, 3:10 PM

## 2024-03-14 NOTE — Progress Notes (Signed)
 EPIC Encounter for ICM Monitoring  Patient Name: Christian Bowman is a 78 y.o. male Date: 03/14/2024 Primary Care Physican: Luana Rumple, MD Primary Cardiologist: Croitoru/McLean Electrophysiologist: Morrell Aran Pacing: 97.35.9%            09/13/2023 Office Weight: 192 lbs 01/01/2024 Weight: 189 lbs 03/14/2024 Weight: 189 lbs   Since 20-Feb-2024 Time in AT/AF  0.4 hours/day (1.6 %)   Spoke with patient and heart failure questions reviewed.  Transmission results reviewed.  Pt asymptomatic for fluid accumulation.  He took 1 PRN Lasix  in the last couple of weeks and doing okay.   Diet: Wife records sodium intake by reviewing food labels.     Optivol thoracic impedance suggesting normal fluid levels within the last month.    Prescribed:  Furosemide  20 mg Take 1 tablet (20 mg total) by mouth daily as needed (for a weight over 190 lbs).   Labs: 02/19/2024 Creatinine 1.75, BUN 30, Potassium 4.4, Sodium 139, GFR 40 02/18/2024 Creatinine 1.59, BUN 34, Potassium 4.2, Sodium 136, GFR 45 A complete set of results can be found in Results Review.   Recommendations:  No changes and encouraged to call if experiencing any fluid symptoms.   Follow-up plan: ICM clinic phone appointment on 04/28/2024.   91 day device clinic remote transmission 04/22/2024.     EP/Cardiology Office Visits:  Recall 08/18/2024 with Dr. Mitzie Anda.   05/23/2024 with Dr Alvis Ba.  Recall 06/18/2024 with EP APP.   Copy of ICM check sent to Dr. Lawana Pray.  3 month ICM trend: 03/09/2024.    12-14 Month ICM trend:     Almyra Jain, RN 03/14/2024 3:24 PM

## 2024-03-17 ENCOUNTER — Other Ambulatory Visit: Payer: Self-pay

## 2024-03-17 DIAGNOSIS — N1831 Chronic kidney disease, stage 3a: Secondary | ICD-10-CM

## 2024-03-17 MED ORDER — ACCU-CHEK GUIDE TEST VI STRP: ORAL_STRIP | 2 refills | Status: AC

## 2024-03-19 ENCOUNTER — Other Ambulatory Visit: Payer: Self-pay

## 2024-03-19 DIAGNOSIS — E1122 Type 2 diabetes mellitus with diabetic chronic kidney disease: Secondary | ICD-10-CM

## 2024-03-19 MED ORDER — LEVOTHYROXINE SODIUM 50 MCG PO TABS: 50.0000 ug | ORAL_TABLET | Freq: Every day | ORAL | 1 refills | Status: AC

## 2024-03-21 ENCOUNTER — Inpatient Hospital Stay: Attending: Oncology

## 2024-03-21 ENCOUNTER — Encounter: Payer: Self-pay | Admitting: Oncology

## 2024-03-21 ENCOUNTER — Other Ambulatory Visit: Payer: Self-pay

## 2024-03-21 ENCOUNTER — Other Ambulatory Visit: Payer: Self-pay | Admitting: Oncology

## 2024-03-21 ENCOUNTER — Inpatient Hospital Stay (HOSPITAL_BASED_OUTPATIENT_CLINIC_OR_DEPARTMENT_OTHER): Admitting: Oncology

## 2024-03-21 VITALS — BP 138/88 | HR 78 | Temp 98.6°F | Resp 16 | Ht 72.0 in | Wt 192.8 lb

## 2024-03-21 DIAGNOSIS — E1122 Type 2 diabetes mellitus with diabetic chronic kidney disease: Secondary | ICD-10-CM | POA: Insufficient documentation

## 2024-03-21 DIAGNOSIS — I130 Hypertensive heart and chronic kidney disease with heart failure and stage 1 through stage 4 chronic kidney disease, or unspecified chronic kidney disease: Secondary | ICD-10-CM | POA: Insufficient documentation

## 2024-03-21 DIAGNOSIS — I2510 Atherosclerotic heart disease of native coronary artery without angina pectoris: Secondary | ICD-10-CM | POA: Diagnosis not present

## 2024-03-21 DIAGNOSIS — I5022 Chronic systolic (congestive) heart failure: Secondary | ICD-10-CM | POA: Diagnosis not present

## 2024-03-21 DIAGNOSIS — Z79620 Long term (current) use of immunosuppressive biologic: Secondary | ICD-10-CM | POA: Insufficient documentation

## 2024-03-21 DIAGNOSIS — N1832 Chronic kidney disease, stage 3b: Secondary | ICD-10-CM

## 2024-03-21 DIAGNOSIS — Z8673 Personal history of transient ischemic attack (TIA), and cerebral infarction without residual deficits: Secondary | ICD-10-CM | POA: Insufficient documentation

## 2024-03-21 DIAGNOSIS — R911 Solitary pulmonary nodule: Secondary | ICD-10-CM | POA: Diagnosis not present

## 2024-03-21 DIAGNOSIS — C090 Malignant neoplasm of tonsillar fossa: Secondary | ICD-10-CM | POA: Insufficient documentation

## 2024-03-21 DIAGNOSIS — E039 Hypothyroidism, unspecified: Secondary | ICD-10-CM | POA: Diagnosis not present

## 2024-03-21 DIAGNOSIS — D696 Thrombocytopenia, unspecified: Secondary | ICD-10-CM | POA: Diagnosis not present

## 2024-03-21 DIAGNOSIS — R9389 Abnormal findings on diagnostic imaging of other specified body structures: Secondary | ICD-10-CM

## 2024-03-21 DIAGNOSIS — Z79899 Other long term (current) drug therapy: Secondary | ICD-10-CM | POA: Insufficient documentation

## 2024-03-21 DIAGNOSIS — N183 Chronic kidney disease, stage 3 unspecified: Secondary | ICD-10-CM | POA: Insufficient documentation

## 2024-03-21 DIAGNOSIS — N1831 Chronic kidney disease, stage 3a: Secondary | ICD-10-CM | POA: Insufficient documentation

## 2024-03-21 LAB — CBC WITH DIFFERENTIAL (CANCER CENTER ONLY)
Abs Immature Granulocytes: 0.02 10*3/uL (ref 0.00–0.07)
Basophils Absolute: 0.1 10*3/uL (ref 0.0–0.1)
Basophils Relative: 1 %
Eosinophils Absolute: 0.3 10*3/uL (ref 0.0–0.5)
Eosinophils Relative: 4 %
HCT: 48.7 % (ref 39.0–52.0)
Hemoglobin: 16 g/dL (ref 13.0–17.0)
Immature Granulocytes: 0 %
Lymphocytes Relative: 14 %
Lymphs Abs: 1 10*3/uL (ref 0.7–4.0)
MCH: 30.3 pg (ref 26.0–34.0)
MCHC: 32.9 g/dL (ref 30.0–36.0)
MCV: 92.2 fL (ref 80.0–100.0)
Monocytes Absolute: 0.5 10*3/uL (ref 0.1–1.0)
Monocytes Relative: 7 %
Neutro Abs: 5.6 10*3/uL (ref 1.7–7.7)
Neutrophils Relative %: 74 %
Platelet Count: 123 10*3/uL — ABNORMAL LOW (ref 150–400)
RBC: 5.28 MIL/uL (ref 4.22–5.81)
RDW: 13.1 % (ref 11.5–15.5)
Smear Review: NORMAL
WBC Count: 7.6 10*3/uL (ref 4.0–10.5)
nRBC: 0 % (ref 0.0–0.2)

## 2024-03-21 LAB — CMP (CANCER CENTER ONLY)
ALT: 23 U/L (ref 0–44)
AST: 17 U/L (ref 15–41)
Albumin: 4.4 g/dL (ref 3.5–5.0)
Alkaline Phosphatase: 59 U/L (ref 38–126)
Anion gap: 5 (ref 5–15)
BUN: 30 mg/dL — ABNORMAL HIGH (ref 8–23)
CO2: 30 mmol/L (ref 22–32)
Calcium: 9.6 mg/dL (ref 8.9–10.3)
Chloride: 104 mmol/L (ref 98–111)
Creatinine: 1.63 mg/dL — ABNORMAL HIGH (ref 0.61–1.24)
GFR, Estimated: 43 mL/min — ABNORMAL LOW (ref >60)
Glucose, Bld: 134 mg/dL — ABNORMAL HIGH (ref 70–99)
Potassium: 4.4 mmol/L (ref 3.5–5.1)
Sodium: 139 mmol/L (ref 135–145)
Total Bilirubin: 0.7 mg/dL (ref 0.0–1.2)
Total Protein: 7.6 g/dL (ref 6.5–8.1)

## 2024-03-21 LAB — TSH: TSH: 2.56 u[IU]/mL (ref 0.350–4.500)

## 2024-03-21 MED ORDER — ONDANSETRON HCL 8 MG PO TABS
8.0000 mg | ORAL_TABLET | Freq: Three times a day (TID) | ORAL | 1 refills | Status: AC | PRN
Start: 2024-03-21 — End: ?

## 2024-03-21 MED ORDER — DEXAMETHASONE 4 MG PO TABS: ORAL_TABLET | ORAL | 1 refills | Status: AC

## 2024-03-21 MED ORDER — LIDOCAINE-PRILOCAINE 2.5-2.5 % EX CREA: TOPICAL_CREAM | CUTANEOUS | 3 refills | Status: AC

## 2024-03-21 MED ORDER — PROCHLORPERAZINE MALEATE 10 MG PO TABS
10.0000 mg | ORAL_TABLET | Freq: Four times a day (QID) | ORAL | 1 refills | Status: AC | PRN
Start: 2024-03-21 — End: ?

## 2024-03-21 NOTE — Assessment & Plan Note (Signed)
 Chronic kidney disease with altered kidney function. Cisplatin is avoided due to nephrotoxicity risk. Carboplatin is chosen as it poses less risk to kidney function. Regular monitoring of kidney function is necessary to adjust chemotherapy dosing and prevent further kidney damage. - Monitor kidney function regularly during chemotherapy. - Adjust chemotherapy dosing based on kidney function. - Avoid cisplatin due to nephrotoxicity risk.

## 2024-03-21 NOTE — Assessment & Plan Note (Signed)
 Please review oncology history for additional details and timeline of events.    cT3, cN0, cM0, p16 positive disease, at least stage II.  Patient had consultation with Dr. Vira Grieves, otolaryngology at [facility] on 03/19/2024.  Given his cardiac history, he was felt to be a high risk candidate for surgery and hence chemoradiation was recommended.  Given his CKD, he is not a candidate for cisplatin.  Hence plan is to proceed with weekly carboplatin and paclitaxel during the course of radiation.  Monitoring of kidney function and blood counts is essential to adjust chemotherapy dosing and manage potential side effects such as nausea, vomiting, and fatigue.   If further renal dysfunction is noted, chemotherapy will be adjusted or skipped.   Anti-nausea medications are provided to manage these effects.  - Coordinate chemotherapy with radiation therapy: chemotherapy once a week and radiation five days a week. - Use carboplatin and paclitaxel regimen, adjusting dose based on kidney function. - Provide education session on chemotherapy regimen and side effects. - Monitor kidney function and blood counts regularly. - Prescribe anti-nausea medications to manage chemotherapy-induced nausea and vomiting. - We will place request for Port-A-Cath placement and feeding tube placement  I will plan to see him during the week that he is scheduled to begin radiation treatments, with plan to begin chemotherapy on return visit.

## 2024-03-21 NOTE — Progress Notes (Signed)
 Patient is not a harm to himself or anyone else per his report.

## 2024-03-21 NOTE — Assessment & Plan Note (Signed)
 Congestive heart failure with fluid restriction of 64 ounces per day. Monitoring of fluid intake is necessary to prevent exacerbation of heart failure. Coordination with cardiologist for ongoing management. - Maintain fluid restriction of 64 ounces per day. - Coordinate with cardiologist for ongoing management of heart failure.

## 2024-03-21 NOTE — Assessment & Plan Note (Signed)
 Chronic low platelet count since 2018, fluctuating but not at a dangerous level. Current count is slightly below normal but above the threshold for chemotherapy. Monitoring is necessary to ensure it remains above 50,000 to continue chemotherapy. - Monitor platelet count regularly during chemotherapy. - Hold chemotherapy if platelet count drops below 50,000.

## 2024-03-21 NOTE — Progress Notes (Signed)
 START ON PATHWAY REGIMEN - Head and Neck     A cycle is every 7 days, concurrent with RT:     Paclitaxel      Carboplatin   **Always confirm dose/schedule in your pharmacy ordering system**  Patient Characteristics: Oropharynx, HPV Positive, Preoperative or Nonsurgical Candidate (Clinical Staging), cT0-4, cN1-3 or cT3-4, cN0 Disease Classification: Oropharynx HPV Status: Positive (+) Therapeutic Status: Preoperative or Nonsurgical Candidate (Clinical Staging) AJCC T Category: cT3 AJCC 8 Stage Grouping: II AJCC N Category: cN0 AJCC M Category: cM0 Intent of Therapy: Curative Intent, Discussed with Patient

## 2024-03-21 NOTE — Progress Notes (Signed)
 [facility]  ONCOLOGY CONSULT NOTE   PATIENT NAME: Christian Bowman   MR#: [id number] DOB: [birth year]  DATE OF SERVICE: 03/21/2024   REFERRING PROVIDER  Christian Last, MD, Otolaryngology   Patient Care Team: Christian Rumple, MD as PCP - General (Cardiology) Christian Pump, MD as PCP - Electrophysiology (Cardiology) Christian Eisenmenger, MD as PCP - Cardiology (Cardiology) Christian Abbey, DO as Consulting Physician (Neurology) Christian Last, MD as Consulting Physician (Otolaryngology) Christian Dawes, MD as Consulting Physician (Radiation Oncology) Christian Berber, MD as Consulting Physician (Oncology) Bowman, Christian Aye, RN as Oncology Nurse Navigator    CHIEF COMPLAINT/ PURPOSE OF CONSULTATION:   Squamous cell carcinoma of the right tonsil, cT3, cN0, cM0, p16 positive, stage II disease at least.  ASSESSMENT & PLAN:   Christian Bowman is a 78 y.o. 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  Primary cancer of tonsillar fossa (HCC) Please review oncology history for additional details and timeline of events.    cT3, cN0, cM0, p16 positive disease, at least stage II.  Patient had consultation with Dr. Vira Bowman, otolaryngology at [facility] on 03/19/2024.  Given his cardiac history, he was felt to be a high risk candidate for surgery and hence chemoradiation was recommended.  Given his CKD, he is not a candidate for cisplatin.  Hence plan is to proceed with weekly carboplatin and paclitaxel during the course of radiation.  Monitoring of kidney function and blood counts is essential to adjust chemotherapy dosing and manage potential side effects such as nausea,  vomiting, and fatigue.   If further renal dysfunction is noted, chemotherapy will be adjusted or skipped.   Anti-nausea medications are provided to manage these effects.  - Coordinate chemotherapy with radiation therapy: chemotherapy once a week and radiation five days a week. - Use carboplatin and paclitaxel regimen, adjusting dose based on kidney function. - Provide education session on chemotherapy regimen and side effects. - Monitor kidney function and blood counts regularly. - Prescribe anti-nausea medications to manage chemotherapy-induced nausea and vomiting. - We will place request for Port-A-Cath placement and feeding tube placement  I will plan to see him during the week that he is scheduled to begin radiation treatments, with plan to begin chemotherapy on return visit.   CKD (chronic kidney disease) stage 3, GFR 30-59 ml/min (HCC) Chronic kidney disease with altered kidney function. Cisplatin is avoided due to nephrotoxicity risk. Carboplatin is chosen as it poses less risk to kidney function. Regular monitoring of kidney function is necessary to adjust chemotherapy dosing and prevent further kidney damage. - Monitor kidney function regularly during chemotherapy. - Adjust chemotherapy dosing based on kidney function. - Avoid cisplatin due to nephrotoxicity risk.  Thrombocytopenia (HCC) Chronic low platelet count since 2018, fluctuating but not at a dangerous level. Current count is slightly below normal but above the threshold for chemotherapy. Monitoring is necessary to ensure it remains above 50,000 to continue chemotherapy. - Monitor platelet count regularly during chemotherapy. - Hold chemotherapy if platelet count drops below 50,000.  Chronic systolic CHF (congestive heart failure) (HCC) Congestive heart failure with fluid restriction of 64 ounces per day. Monitoring of fluid intake is necessary to prevent exacerbation of heart failure. Coordination with cardiologist  for  ongoing management. - Maintain fluid restriction of 64 ounces per day. - Coordinate with cardiologist for ongoing management of heart failure.  Hypothyroidism (acquired) Hypothyroidism managed with levothyroxine , recently adjusted to 50 mcg. Monitoring of thyroid  function is necessary as radiation may alter thyroid  function. - Monitor thyroid  function during treatment. - Coordinate with endocrinologist for ongoing management.     I reviewed lab results and outside records for this visit and discussed relevant results with the patient. Diagnosis, plan of care and treatment options were also discussed in detail with the patient. Opportunity provided to ask questions and answers provided to his apparent satisfaction. Provided instructions to call our clinic with any problems, questions or concerns prior to return visit. I recommended to continue follow-up with PCP and sub-specialists. He verbalized understanding and agreed with the plan. No barriers to learning was detected.  NCCN guidelines have been consulted in the planning of this patient's care.  Christian Berber, MD  03/21/2024 5:57 PM  [facility] - A DEPT OF [facility] [address] [place] [address] Dept: [phone] Dept Fax: [phone]   HISTORY OF PRESENTING ILLNESS:   I have reviewed his chart and materials related to his cancer extensively and collaborated history with the patient. Summary of oncologic history is as follows:  ONCOLOGY HISTORY:  78 y.o. gentleman who presented to the ED on 02/18/24 for an episode of hemoptysis and complains of dysphagia.   Patient reports longstanding history (7-8 years) of a large right neck mass for which he had a workup and was assured that it was benign. Most recently, her presented to the ER on 02/18/2024 due to an episode of hemoptysis when he coughed up a large bright red blood clot. To further investigate his  symptoms, he underwent a CT neck in the ER showing a large tonsillar mass measuring 2.2 x 3.1 x 3.7 cm on the right side with necrotic lymph nodes concerning for malignancy and a 6.5 x 4.0 cm predominantly low attenuation lesion centered at the right level 2 station, concerning for a cystic/necrotic lymph node. Scan also indicated a nonspecific 5 mm cystic-appearing lesion within the vallecula on the right.       Subsequently, the patient saw Dr. Soldatova who preformed a flexible laryngoscopy showing evidence of a large exophytic mass involving right tonsil and soft palate but without any obvious involvement of the base of the tongue.   Biopsy of right tonsillar mass on 02/18/24 revealed: squamous cell carcinoma, p16 positive.    Staging PET scan on 02/28/24 revealing a hypermetabolic mass centered in the right palatine tonsil pharyngeal region extending to the glossal tonsillar sulcus and the soft palate. No additional areas of abnormal uptake at this time to suggest spread of disease. Scan also noted small lung nodule seen previously are not well seen on the current examination but there is significant motion.    CT chest preformed on 02/18/24 showed a new 5 mm nodule within the posterior and medial left lower lobe with follow up recommended. No acute findings within the chest. No explanation for patient's hemoptysis within the chest and mild diffuse bronchial wall thickening is nonspecific, but can be seen in the setting of bronchitis.    Swallowing issues, if any: Dysphagia with bread. Patient denies odynophagia.    Weight Changes: Weight has remained stable.    Pain status: Patient denies pain.    Other symptoms: right-sided neck swelling, throat  clearing    He has a cystic lesion in the mid right forehead which he reports is from a traumatic injury as a child   He also reports that he had a cystic lesion in the left neck which regressed several years ago after appearing at the same time as  the right neck mass.   Patient had consultation with Dr. Vira Bowman, otolaryngology at [facility] on 03/19/2024.  Given his cardiac history, he was felt to be a high risk candidate for surgery and hence chemoradiation was recommended.  Given his CKD, he is not a candidate for cisplatin.  Hence plan is to proceed with weekly carboplatin and paclitaxel during the course of radiation.  Oncology History  Primary cancer of tonsillar fossa (HCC)  03/04/2024 Initial Diagnosis   Primary cancer of tonsillar fossa (HCC)   03/04/2024 Cancer Staging   Staging form: Pharynx - HPV-Mediated Oropharynx, AJCC 8th Edition - Clinical stage from 03/04/2024: Stage II (cT3, cN0, cM0, p16+) - Signed by Christian Berber, MD on 03/21/2024 Stage prefix: Initial diagnosis     INTERVAL HISTORY:  Discussed the use of AI scribe software for clinical note transcription with the patient, who gave verbal consent to proceed.  History of Present Illness Christian Bowman is a 78 year old male with head and neck cancer who presents for consultation regarding chemotherapy and radiation treatment. He was referred by Dr. Vira Bowman for further management of his head and neck cancer.  He experiences difficulty swallowing, particularly with bread, but without associated pain. No new or worsening breathing difficulties are noted. No current cough or chest pain.  He has a history of chronic kidney disease with abnormal kidney function tests, monitored by his endocrinologist due to diabetes. He does not see a nephrologist regularly.  He has a history of heart issues, including congestive heart failure for the past 20 years. He carries nitroglycerin , using it approximately 10 to 15 times every six months. He attends cardiac rehabilitation twice a week and is monitored by cardiologists.  He has a history of low platelet counts, fluctuating since 2018, with recent counts around 121,000 to 132,000.  He is on levothyroxine , recently adjusted to 50  mcg, for thyroid  management, monitored by his endocrinologist.  No current nausea or vomiting, but he is concerned about potential chemotherapy-induced nausea and vomiting, referencing past family experiences.  He has a fluid restriction of 64 ounces per day due to congestive heart failure.   MEDICAL HISTORY:  Past Medical History:  Diagnosis Date   AICD (automatic cardioverter/defibrillator) present    medtronic-   DR. CROITORU , DR. Mitzie Anda    Anginal pain (HCC)    cp sat 08/11/17   Anxiety    CAD (coronary artery disease)    CHF (congestive heart failure) (HCC)    Complication of anesthesia    took awhile to wake up    Coronary artery disease involving coronary bypass graft    Cyst of neck    right side   DM2 (diabetes mellitus, type 2) (HCC) 08/26/2013   Dyspnea    Heart attack (HCC)    not sure when (08/20/2017)   HTN (hypertension) 08/26/2013   Hyperlipidemia 08/26/2013   Hypothyroidism    Left main coronary artery disease    Left renal artery stenosis (HCC)    Genesis 6x12 stent 2007   Localized cancer of throat (HCC)    Obesity (BMI 30.0-34.9) 08/26/2013   Postoperative atrial fibrillation (HCC) 10/15/2005   Presence of permanent cardiac pacemaker  S/P CABG x 4 10/13/2005   LIMA to LAD, SVG to intermediate branch, sequential SVG to PDA and RPL branch, EVH via right thigh   S/P mitral valve replacement with bioprosthetic valve 07/11/2016   31 mm [facility] Mitral bovine bioprosthetic tissue valve   S/P redo CABG x 2 07/11/2016   SVG to PDA and SVG to Intermediate Branch, EVH via left thigh    SURGICAL HISTORY: Past Surgical History:  Procedure Laterality Date   A-FLUTTER ABLATION N/A 05/11/2017   Procedure: A-Flutter Ablation;  Surgeon: Christian Pump, MD;  Location: [facility] INVASIVE CV LAB;  Service: Cardiovascular;  Laterality: N/A;   BIV ICD GENERATOR CHANGEOUT N/A 08/29/2023   Procedure: BIV ICD GENERATOR CHANGEOUT;  Surgeon: Christian Pump,  MD;  Location: [facility] INVASIVE CV LAB;  Service: Cardiovascular;  Laterality: N/A;   CARDIAC CATHETERIZATION N/A 06/21/2016   Procedure: Right/Left Heart Cath and Coronary/Graft Angiography;  Surgeon: Arnoldo Lapping, MD;  Location: [facility] INVASIVE CV LAB;  Service: Cardiovascular;  Laterality: N/A;   CARDIAC VALVE REPLACEMENT     CARDIOVERSION N/A 07/19/2016   Procedure: CARDIOVERSION;  Surgeon: Lenise Quince, MD;  Location: [facility] ENDOSCOPY;  Service: Cardiovascular;  Laterality: N/A;   CARDIOVERSION N/A 09/08/2016   Procedure: CARDIOVERSION;  Surgeon: Elmyra Haggard, MD;  Location: [facility] ENDOSCOPY;  Service: Cardiovascular;  Laterality: N/A;   CORONARY ANGIOPLASTY     STENT 2016  [facility]   CORONARY ANGIOPLASTY WITH STENT PLACEMENT     DES in SVG to right coronary artery system   CORONARY ARTERY BYPASS GRAFT  10/13/2005   LIMA to LAD, SVG to intermediate branch, sequential SVG to PDA and RPL   CORONARY ARTERY BYPASS GRAFT N/A 07/11/2016   Procedure: REDO CORONARY ARTERY BYPASS GRAFTING (CABG) x two using left leg greater saphenous vein harvested endoscopically-SVG to PDA -SVG to RAMUS INTERMEDIATE;  Surgeon: Gardenia Jump, MD;  Location: [facility] OR;  Service: Open Heart Surgery;  Laterality: N/A;   CORONARY ARTERY BYPASS GRAFT N/A 07/11/2016   Procedure: Re-exploration (CABG) for post op bleeding,;  Surgeon: Gardenia Jump, MD;  Location: [facility] OR;  Service: Open Heart Surgery;  Laterality: N/A;   EP IMPLANTABLE DEVICE N/A 07/25/2016   Procedure: BiV ICD Insertion CRT-D;  Surgeon: Will Cortland Ding, MD;  Location: [facility] INVASIVE CV LAB;  Service: Cardiovascular;  Laterality: N/A;   INGUINAL HERNIA REPAIR Left 08/20/2017   INGUINAL HERNIA REPAIR Left 08/20/2017   Procedure: OPEN LEFT INGUINAL HERNIA REPAIR;  Surgeon: Juanita Norlander, MD;  Location: [facility] OR;  Service: General;  Laterality: Left;   LEFT HEART CATH AND CORS/GRAFTS ANGIOGRAPHY N/A 12/18/2016   Procedure: Left Heart Cath and Cors/Grafts  Angiography;  Surgeon: Arnoldo Lapping, MD;  Location: [facility] INVASIVE CV LAB;  Service: Cardiovascular;  Laterality: N/A;   LEFT HEART CATH AND CORS/GRAFTS ANGIOGRAPHY N/A 08/22/2017   Procedure: LEFT HEART CATH AND CORS/GRAFTS ANGIOGRAPHY;  Surgeon: Christian Eisenmenger, MD;  Location: [facility] INVASIVE CV LAB;  Service: Cardiovascular;  Laterality: N/A;   MITRAL VALVE REPLACEMENT N/A 07/11/2016   Procedure: MITRAL VALVE (MV) REPLACEMENT;  Surgeon: Gardenia Jump, MD;  Location: [facility] OR;  Service: Open Heart Surgery;  Laterality: N/A;   MYOCARDICAL PERFUSION  10/09/2007   NORMAL PERFUSION IN ALL REGIONS;NO EVIDENCE OF INDUCIBLE ISCHEMIA;POST STRESS EF% 66   RENAL ARTERY STENT Right 2007   RENAL DOPPLER  03/28/2010   RIGHT RA-NORMAL;LEFT PROXIMAL RA AT STENT-PATENT WITH NO EVIDENCE OF SIGN DIAMETER REDUCTION. R & L KIDNEYS:  EQUAL IN SIZE,SYMMETRICAL IN SHAPE.   RIGHT HEART CATH N/A 01/08/2020   Procedure: RIGHT HEART CATH;  Surgeon: Christian Eisenmenger, MD;  Location: [facility] INVASIVE CV LAB;  Service: Cardiovascular;  Laterality: N/A;   TEE WITHOUT CARDIOVERSION N/A 06/15/2016   Procedure: TRANSESOPHAGEAL ECHOCARDIOGRAM (TEE);  Surgeon: Christian Rumple, MD;  Location: [facility] ENDOSCOPY;  Service: Cardiovascular;  Laterality: N/A;   TEE WITHOUT CARDIOVERSION N/A 07/11/2016   Procedure: TRANSESOPHAGEAL ECHOCARDIOGRAM (TEE);  Surgeon: Gardenia Jump, MD;  Location: [facility] OR;  Service: Open Heart Surgery;  Laterality: N/A;   TEE WITHOUT CARDIOVERSION N/A 07/19/2016   Procedure: TRANSESOPHAGEAL ECHOCARDIOGRAM (TEE);  Surgeon: Lenise Quince, MD;  Location: [facility] ENDOSCOPY;  Service: Cardiovascular;  Laterality: N/A;   TRANSESOPHAGEAL ECHOCARDIOGRAM  10/19/2005   NORMAL LV; MILD TO MODERATE AMOUNT OF SOFT ATHEROMATOUS PLAQUE OF THE THORACIC AORTA; THE LEFT ATRIUM IS MILDLY DILATED;LEFT ATRIAL APPENDAGE FUNCTION IS NORMAL;NO THROMBUS IDENTIFIED. SMALL PFO WITH RIGHT TO LEFT SHUNT    SOCIAL HISTORY: Social History   Socioeconomic  History   Marital status: Married    Spouse name: Not on file   Number of children: 0   Years of education: 18   Highest education level: Not on file  Occupational History   Occupation: retired  Tobacco Use   Smoking status: Never   Smokeless tobacco: Never  Vaping Use   Vaping status: Never Used  Substance and Sexual Activity   Alcohol use: No   Drug use: No   Sexual activity: Never  Other Topics Concern   Not on file  Social History Narrative   Right handed   Two story home   Drinks half and half coffee   Social Drivers of Health   Financial Resource Strain: Not on file  Food Insecurity: No Food Insecurity (03/21/2024)   Hunger Vital Sign    Worried About Running Out of Food in the Bowman Year: Never true    Ran Out of Food in the Bowman Year: Never true  Transportation Needs: No Transportation Needs (03/21/2024)   PRAPARE - Administrator, Civil Service (Medical): No    Lack of Transportation (Non-Medical): No  Physical Activity: Not on file  Stress: Not on file  Social Connections: Moderately Integrated (02/18/2024)   Social Connection and Isolation Panel    Frequency of Communication with Friends and Family: Once a week    Frequency of Social Gatherings with Friends and Family: Twice a week    Attends Religious Services: More than 4 times per year    Active Member of [employer] or Organizations: No    Attends Banker Meetings: Never    Marital Status: Married  Catering manager Violence: Not At Risk (03/21/2024)   Humiliation, Afraid, Rape, and Kick questionnaire    Fear of Current or Ex-Partner: No    Emotionally Abused: No    Physically Abused: No    Sexually Abused: No    FAMILY HISTORY: Family History  Problem Relation Age of Onset   Heart attack Mother    Heart disease Mother    Heart attack Father    Heart disease Father    Hypertension Father    Hypertension Sister    Heart disease Maternal Grandmother     ALLERGIES:  He is  allergic to xanax [alprazolam].  MEDICATIONS:  Current Outpatient Medications  Medication Sig Dispense Refill   acetaminophen  (TYLENOL ) 325 MG tablet Take 325 mg by mouth every 6 (six) hours as needed for mild pain (  pain score 1-3).     apixaban  (ELIQUIS ) 5 MG TABS tablet Take 1 tablet (5 mg total) by mouth 2 (two) times daily. 60 tablet 6   Blood Glucose Monitoring Suppl (ACCU-CHEK GUIDE ME) w/Device KIT 1 Piece by Does not apply route as directed. 1 kit 0   carvedilol  (COREG ) 12.5 MG tablet TAKE 1 & 1/2 (ONE & ONE-HALF) TABLETS BY MOUTH TWICE DAILY WITH A MEAL 180 tablet 3   Cholecalciferol  (VITAMIN D ) 50 MCG (2000 UT) tablet Take 2,000 Units by mouth daily with breakfast.     dapagliflozin  propanediol (FARXIGA ) 10 MG TABS tablet Take 1 tablet (10 mg total) by mouth daily. 90 tablet 3   digoxin  (LANOXIN ) 0.125 MG tablet Take 1/2 (one-half) tablet by mouth once daily 45 tablet 3   ENTRESTO  97-103 MG Take 1 tablet by mouth twice daily 180 tablet 0   eplerenone  (INSPRA ) 50 MG tablet Take 1 tablet (50 mg total) by mouth daily. 90 tablet 3   furosemide  (LASIX ) 20 MG tablet Take 1 tablet (20 mg total) by mouth daily as needed (for a weight over 190 lbs). 30 tablet 11   glipiZIDE  (GLUCOTROL  XL) 2.5 MG 24 hr tablet Take 1 tablet (2.5 mg total) by mouth daily with breakfast. 90 tablet 1   glucose blood (ACCU-CHEK GUIDE TEST) test strip Use to test blood glucose daily as instructed 100 each 2   levothyroxine  (SYNTHROID ) 50 MCG tablet Take 1 tablet (50 mcg total) by mouth daily before breakfast. 90 tablet 1   neomycin-bacitracin-polymyxin (NEOSPORIN) ointment Apply 1 application  topically daily as needed for wound care.     nitroGLYCERIN  (NITROSTAT ) 0.4 MG SL tablet DISSOLVE 1 TABLET UNDER THE TONGUE EVERY 5 MINUTES AS NEEDED FOR CHEST PAIN, DO NOT EXCEED 3 DOSES IN 15 MINUTES 25 tablet 1   polyethylene glycol (MIRALAX ) 17 g packet Take 17 g by mouth daily as needed for mild constipation or moderate  constipation.     rosuvastatin  (CRESTOR ) 10 MG tablet Take 1 tablet (10 mg total) by mouth daily. 90 tablet 3   sodium zirconium cyclosilicate  (LOKELMA ) 10 g PACK packet DISSOLVE 1 PACKET IN WATER & DRINK ONCE DAILY 90 each 3   Tetrahydrozoline HCl (VISINE OP) Place 1 drop into both eyes daily as needed (irritation).     No current facility-administered medications for this visit.    REVIEW OF SYSTEMS:    Review of Systems  HENT:   Positive for trouble swallowing.   Neurological:  Positive for dizziness.       Hx of stroke, has lack of sensation in right hand    All other pertinent systems were reviewed with the patient and are negative.  PHYSICAL EXAMINATION:   Onc Performance Status - 03/21/24 0906       ECOG Perf Status   ECOG Perf Status Restricted in physically strenuous activity but ambulatory and able to carry out work of a light or sedentary nature, e.g., light house work, office work      KPS SCALE   KPS % SCORE Cares for self, unable to carry on normal activity or to do active work          Vitals:   03/21/24 0906  BP: 138/88  Pulse: 78  Resp: 16  Temp: 98.6 F (37 C)  SpO2: 99%   Filed Weights   03/21/24 0906  Weight: 192 lb 12.8 oz (87.5 kg)    Physical Exam Constitutional:      General: He  is not in acute distress.    Appearance: Normal appearance.  HENT:     Head: Normocephalic and atraumatic.     Mouth/Throat:     Comments: ~ 3-4 cm exophytic lesion involving the entire right tonsillar fossa, with palate involvement, reaching the uvula but not crossing midline.  Eyes:     Conjunctiva/sclera: Conjunctivae normal.    Cardiovascular:     Rate and Rhythm: Normal rate and regular rhythm.     Heart sounds: Normal heart sounds.  Pulmonary:     Effort: Pulmonary effort is normal. No respiratory distress.     Breath sounds: Normal breath sounds.  Abdominal:     General: There is no distension.  Lymphadenopathy:     Cervical: Cervical  adenopathy (right sided ~ 6 cm, soft, mobile) present.   Neurological:     General: No focal deficit present.     Mental Status: He is alert and oriented to person, place, and time.   Psychiatric:        Mood and Affect: Mood normal.        Behavior: Behavior normal.      LABORATORY DATA:   I have reviewed the data as listed.  Results for orders placed or performed in visit on 03/21/24  TSH  Result Value Ref Range   TSH 2.560 0.350 - 4.500 uIU/mL  CMP (Cancer Center only)  Result Value Ref Range   Sodium 139 135 - 145 mmol/L   Potassium 4.4 3.5 - 5.1 mmol/L   Chloride 104 98 - 111 mmol/L   CO2 30 22 - 32 mmol/L   Glucose, Bld 134 (H) 70 - 99 mg/dL   BUN 30 (H) 8 - 23 mg/dL   Creatinine 4.09 (H) 8.11 - 1.24 mg/dL   Calcium  9.6 8.9 - 10.3 mg/dL   Total Protein 7.6 6.5 - 8.1 g/dL   Albumin  4.4 3.5 - 5.0 g/dL   AST 17 15 - 41 U/L   ALT 23 0 - 44 U/L   Alkaline Phosphatase 59 38 - 126 U/L   Total Bilirubin 0.7 0.0 - 1.2 mg/dL   GFR, Estimated 43 (L) >60 mL/min   Anion gap 5 5 - 15  CBC with Differential (Cancer Center Only)  Result Value Ref Range   WBC Count 7.6 4.0 - 10.5 K/uL   RBC 5.28 4.22 - 5.81 MIL/uL   Hemoglobin 16.0 13.0 - 17.0 g/dL   HCT 91.4 78.2 - 95.6 %   MCV 92.2 80.0 - 100.0 fL   MCH 30.3 26.0 - 34.0 pg   MCHC 32.9 30.0 - 36.0 g/dL   RDW 21.3 08.6 - 57.8 %   Platelet Count 123 (L) 150 - 400 K/uL   nRBC 0.0 0.0 - 0.2 %   Neutrophils Relative % 74 %   Neutro Abs 5.6 1.7 - 7.7 K/uL   Lymphocytes Relative 14 %   Lymphs Abs 1.0 0.7 - 4.0 K/uL   Monocytes Relative 7 %   Monocytes Absolute 0.5 0.1 - 1.0 K/uL   Eosinophils Relative 4 %   Eosinophils Absolute 0.3 0.0 - 0.5 K/uL   Basophils Relative 1 %   Basophils Absolute 0.1 0.0 - 0.1 K/uL   WBC Morphology MORPHOLOGY UNREMARKABLE    RBC Morphology MORPHOLOGY UNREMARKABLE    Smear Review Normal platelet morphology    Immature Granulocytes 0 %   Abs Immature Granulocytes 0.02 0.00 - 0.07 K/uL     RADIOGRAPHIC STUDIES:  I have personally reviewed the radiological  images as listed and agree with the findings in the report.  NM PET Image Initial (PI) Skull Base To Thigh (F-18 FDG) Result Date: 02/28/2024 CLINICAL DATA:  Initial treatment strategy for right tonsillar squamous cell carcinoma. EXAM: NUCLEAR MEDICINE PET SKULL BASE TO THIGH TECHNIQUE: 9.82 mCi F-18 FDG was injected intravenously. Full-ring PET imaging was performed from the skull base to thigh after the radiotracer. CT data was obtained and used for attenuation correction and anatomic localization. Fasting blood glucose: 172 mg/dl COMPARISON:  CT neck and chest 02/18/2024. Older exams as well. MRI abdomen 2016. FINDINGS: Mediastinal blood pool activity: SUV max 2.5 Liver activity: SUV max 3.0 NECK: Once again there is a mass centered in the right palatine tonsil pharyngeal region extending to the glossal tonsillar sulcus and the soft palate. This has significant abnormal radiotracer uptake with maximum SUV value of 10.7. No additional areas of abnormal uptake identified in the neck including along lymph node chains the submandibular, posterior triangle or internal jugular region. As described previously there is a cystic lesion identified in the right neck, caudal and anterior to the right parotid gland, posterolateral to the submandibular gland and lateral to the carotid space. Appearance is similar. No abnormal uptake. Please correlate for other potential neck cystic lesions. On prior contrast neck CT this measured 6.5 x 4.0 cm. Incidental CT findings: Scattered vascular calcifications identified. Small left thyroid  lobe. The submandibular glands are preserved. Streak artifact related to the patient's dental hardware. Visualized paranasal sinuses and mastoid air cells are clear. CHEST: Small area of uptake identified in the area of some calcification abutting the left side of the anterior superior mediastinum on image 61. The soft  tissue thickening in this location is similar going back to a study of 2023 CT scan with a focus of nodularity measuring 12 x 8 mm on series 202, image 61. In 2023 area measured 16 x 9 mm. Maximum SUV of 3.8. No additional areas of abnormal uptake above blood pool in the axillary region, hilum or mediastinum. No other areas of abnormal lung uptake. Incidental CT findings: Status post median sternotomy. The heart is enlarged. Battery pack along the left upper thorax with leads extending along the right side of the heart. prosthetic mitral valve. Diffuse vascular calcifications along the thoracic aorta. Thoracic esophagus is normal course and caliber. Breathing motion. Dependent lung base atelectasis. No consolidation, pneumothorax or effusion. ABDOMEN/PELVIS: Physiologic distribution radiotracer along the parenchymal organs, bowel and renal collecting systems. No abnormal nodal uptake. Incidental CT findings: Extensive vascular calcifications identified. Grossly, the liver, spleen, adrenal glands are unremarkable. Mild global pancreatic atrophy. Gallbladder is nondilated. Moderate renal atrophy, left worse than right. Once again there are numerous left-sided renal cystic foci. These are low density on CT. Nonobstructing left-sided lower pole renal stone. No ureteral stones. Preserved contour to the urinary bladder. Small bilateral fat containing inguinal hernias. Stomach is nondilated. The small bowel has a normal course and caliber. There is high density material along the course of the colon. Scattered colonic diverticula. Scattered stool. Normal appendix posterolateral to the cecum in the right hemipelvis. Diffuse motion. SKELETON: No abnormal uptake along the visualized osseous structures. Incidental CT findings: Diffuse scattered degenerative changes IMPRESSION: Primary lesion along the right palatine tonsil region is hypermetabolic consistent with history of squamous cell carcinoma. No additional areas of  abnormal uptake at this time to suggest spread of disease. These large cystic lesion in the right neck does not have any abnormal uptake. Although in the setting  of a known malignancy in the neck, cystic nodal metastasis without uptake is technically possible as felt to be less likely. Another possibility would be a type 2 branchial cleft cyst but again there is no clear neck extending between the ICA and ECA. Please correlate for any known history. Any prior imaging of the area may be useful to assess for chronicity. Please correlate with any particular symptoms. A neck MRI without contrast may be of some benefit but the patient does have a defibrillator. Please correlate for MRI clearance. Small lung nodule seen previously are not well seen on the current examination but there is significant motion. Follow up as per prior recommendations. Postop chest with defibrillator. Enlarged heart. Mild increased uptake seen along an area of nodularity along the left side of the upper mediastinum but this area has been present since 2023. Colonic diverticula. Renal atrophy. Nonobstructing renal stone. Other ancillary findings as above Electronically Signed   By: Adrianna Horde M.D.   On: 02/28/2024 19:01    Orders Placed This Encounter  Procedures   CBC with Differential (Cancer Center Only)    Standing Status:   Future    Number of Occurrences:   1    Expiration Date:   03/21/2025   CMP (Cancer Center only)    Standing Status:   Future    Number of Occurrences:   1    Expiration Date:   03/21/2025   TSH    Standing Status:   Future    Number of Occurrences:   1    Expiration Date:   03/21/2025    CODE STATUS:  Code Status History     Date Active Date Inactive Code Status Order ID Comments User Context   02/18/2024 1202 02/19/2024 2007 Do not attempt resuscitation (DNR) PRE-ARREST INTERVENTIONS DESIRED [id number]  Ivin Marrow, MD ED   01/08/2020 1342 01/08/2020 1958 Full Code [id number]  Christian Eisenmenger, MD  Inpatient   08/20/2017 1404 08/23/2017 1652 Full Code [id number]  Juanita Norlander, MD Inpatient   05/11/2017 1020 05/11/2017 1929 Full Code [id number]  Christian Pump, MD Inpatient   12/15/2016 0851 12/19/2016 1357 Full Code [id number]  Dorma Gash, PA Inpatient   07/11/2016 1713 08/01/2016 1629 Full Code [id number]  Gardenia Jump, MD Inpatient   06/21/2016 1332 06/21/2016 1935 Full Code [id number]  Arnoldo Lapping, MD Inpatient    Questions for Most Recent Historical Code Status (Order [id number])     Question Answer   If pulseless and not breathing No CPR or chest compressions.   In Pre-Arrest Conditions (Patient Has Pulse and Is Breathing) May intubate, use advanced airway interventions and cardioversion/ACLS medications if appropriate or indicated. May transfer to ICU.   Consent: Discussion documented in EHR or advanced directives reviewed            Future Appointments  Date Time Provider Department Center  03/26/2024 11:00 AM [facility] CHEMO EDU [facility] None  03/26/2024  1:15 PM [facility] NURSE [facility] None  03/26/2024  2:00 PM Christian Dawes, MD [facility] None  04/09/2024 11:30 AM [facility] 1 [facility] [place]  04/09/2024 12:00 PM [facility] 1 [facility]   04/22/2024  8:10 AM [facility] HVT DEVICE REMOTES [facility] H[facility]V  04/28/2024  7:10 AM [facility] HVT DEVICE REMOTES [facility] H&V  05/23/2024  9:40 AM Croitoru, Mihai, MD [facility] H&V  07/22/2024  8:10 AM [facility] HVT DEVICE REMOTES [facility] H&V  09/10/2024  9:30 AM Nida, Jaynee Meyer, MD [facility] None     I spent  a total of 70 minutes during this encounter with the patient including review of chart and various tests results, discussions about plan of care and coordination of care plan.  This document was completed utilizing speech recognition software. Grammatical errors, random word insertions, pronoun errors, and incomplete sentences are an occasional consequence of this system due to software limitations, ambient noise, and  hardware issues. Any formal questions or concerns about the content, text or information contained within the body of this dictation should be directly addressed to the provider for clarification.

## 2024-03-21 NOTE — Assessment & Plan Note (Signed)
 Hypothyroidism managed with levothyroxine , recently adjusted to 50 mcg. Monitoring of thyroid  function is necessary as radiation may alter thyroid  function. - Monitor thyroid  function during treatment. - Coordinate with endocrinologist for ongoing management.

## 2024-03-21 NOTE — Progress Notes (Signed)
 Oncology Nurse Navigator Documentation   Met with patient during initial consult with Dr. Randye Buttner. He was accompanied by his wife, Edd Gong.  Further introduced myself as his/their Navigator, explained my role as a member of the Care Team. Assisted with post-consult appt scheduling. He has been scheduled for chemo education. I am working on dates for CT simulation and PEG/PAC placement. They know that I will call today or Monday with those appointments.  Scatter protective devices have been delivered by [facility] from Dr. Benedetto Brady office.  They verbalized understanding of information provided. I encouraged them to call with questions/concerns moving forward.  Lynetta Saran, RN, BSN, OCN Head & Neck Oncology Nurse Navigator [facility] at [place] [phone]

## 2024-03-22 ENCOUNTER — Other Ambulatory Visit: Payer: Self-pay

## 2024-03-24 ENCOUNTER — Encounter (HOSPITAL_COMMUNITY): Payer: Self-pay

## 2024-03-24 NOTE — Progress Notes (Signed)
 RE: G-TUBE REVIEW Received: 3 days ago Luverne Aran, MD  Tommie Riggs OK for g-tube but definitely needs barium night before due to proximity of colon to stomach.  GY

## 2024-03-25 ENCOUNTER — Other Ambulatory Visit: Payer: Self-pay | Admitting: Oncology

## 2024-03-25 ENCOUNTER — Encounter: Payer: Self-pay | Admitting: Oncology

## 2024-03-25 DIAGNOSIS — C090 Malignant neoplasm of tonsillar fossa: Secondary | ICD-10-CM

## 2024-03-26 ENCOUNTER — Ambulatory Visit
Admission: RE | Admit: 2024-03-26 | Discharge: 2024-03-26 | Disposition: A | Discharge status: Home / Self Care | Source: Ambulatory Visit | Attending: Radiation Oncology | Admitting: Radiation Oncology

## 2024-03-26 ENCOUNTER — Other Ambulatory Visit: Payer: Self-pay

## 2024-03-26 ENCOUNTER — Encounter: Payer: Self-pay | Admitting: Oncology

## 2024-03-26 ENCOUNTER — Other Ambulatory Visit: Payer: Self-pay | Admitting: Oncology

## 2024-03-26 ENCOUNTER — Inpatient Hospital Stay

## 2024-03-26 VITALS — BP 137/92 | HR 83 | Temp 97.8°F | Resp 20 | Ht 72.0 in | Wt 193.2 lb

## 2024-03-26 DIAGNOSIS — C090 Malignant neoplasm of tonsillar fossa: Secondary | ICD-10-CM

## 2024-03-26 NOTE — Progress Notes (Signed)
 Has armband been applied?  Yes.    Does patient have an allergy to IV contrast dye?: No.   Has patient ever received premedication for IV contrast dye?: No.   Date of lab work: 03/21/2024 BUN: 30 CR: 1.63 eGFR: 43  Does patient take metformin ?: No.  Is eGFR >60?:  If no, when can patient resume? (Must be 48 hrs AFTER they receive IV contrast):    IV site: antecubital right, condition patent and no redness  1st attempt to right hand was unsuccessful, vein blew when flushing. IV removed. Pressure and ice pack applied.  Has IV site been added to flowsheet?  Yes.    Patient to receive reduced dose of contrast due to eGFR 43.    BP (!) 137/92 (BP Location: Left Arm, Patient Position: Sitting, Cuff Size: Large)   Pulse 83   Temp 97.8 F (36.6 C)   Resp 20   Ht 6' (1.829 m)   Wt 193 lb 3.2 oz (87.6 kg)   SpO2 99%   BMI 26.20 kg/m

## 2024-03-26 NOTE — Progress Notes (Addendum)
 Oncology Nurse Navigator Documentation   Met with Mr. Christian Bowman and his wife to provide PEG education prior to placement on 04/09/24. Using  PEG teaching device   and Teach Back, provided education for PEG use and care, including: hand hygiene, gravity bolus administration of daily water flushes and nutritional supplement, fluids and medications; care of tube insertion site including daily dressing change and cleaning; S&S of infection.   Mrs. Christian Bowman/Mr. Christian Bowman correctly verbalized procedures for and provided correct return demonstration of gravity administration of water, dressing change and site care.  I provided written instructions for PEG flushing/dressing change in support of verbal instruction.   I provided/described contents of Start of Care Bolus Feeding Kit (2 60 cc syringes, 1 box 4x4 drainage sponges, 1 package mesh briefs, 1 roll paper tape, 1 case Osmolite 1.5).  He voiced understanding he is to start using Osmolite per guidance of Nutrition. They understand I will be available for ongoing PEG support. Provided barium sulfate prep which I obtained from [facility] and reviewed instructions.   To provide support, encouragement and care continuity, met with Mr. Christian Bowman and his wife after his CT SIM.  He tolerated procedure without difficulty, denied questions/concerns.   I encouraged him to call me prior to 7/10 New Start.    Delon Jefferson RN, BSN, OCN Head & Neck Oncology Nurse Navigator [facility] Phone # [phone]  Fax # [phone]

## 2024-03-28 ENCOUNTER — Telehealth: Payer: Self-pay

## 2024-03-28 NOTE — Telephone Encounter (Signed)
 [facility] Clinical Social Work  Clinical Social Work was referred by new patient protocol for New patient assessment.  Clinical Social Worker attempted to contact patient by phone to offer support and assess for needs.   Interventions: Left Vm x1      Follow Up Plan:  CSW will attempt again.    Lizbeth Sprague, LCSW  Clinical Social Worker [facility]

## 2024-03-31 ENCOUNTER — Inpatient Hospital Stay

## 2024-03-31 NOTE — Progress Notes (Signed)
 [facility] Clinical Social Work  Initial Assessment   Christian Bowman is a 78 y.o. year old male contacted by phone. Clinical Social Work was referred by new patient protocol for assessment of psychosocial needs.   SDOH (Social Determinants of Health) assessments performed: Yes SDOH Interventions    Flowsheet Row Office Visit from 03/21/2024 in [facility] - A Dept Of [place]  SDOH Interventions   Food Insecurity Interventions Intervention Not Indicated  Housing Interventions Intervention Not Indicated  Transportation Interventions Intervention Not Indicated  Utilities Interventions Intervention Not Indicated    SDOH Screenings   Food Insecurity: No Food Insecurity (03/21/2024)  Housing: Unknown (03/21/2024)  Transportation Needs: No Transportation Needs (03/21/2024)  Utilities: Not At Risk (03/21/2024)  Depression (PHQ2-9): Medium Risk (03/21/2024)  Social Connections: Moderately Integrated (02/18/2024)  Tobacco Use: Low Risk  (03/21/2024)     Distress Screen completed: No    03/28/2024   12:59 PM  ONCBCN DISTRESS SCREENING  Screening Type Initial Screening  How much distress have you been experiencing in the past week? (0-10) 4  Practical concerns type Treatment decisions  Emotional concerns type Worry or anxiety;Sadness or depression;Loss of interest or enjoyment;Feelings of worthlessness or being a burden  Physical Concerns Type  Sleep;Fatigue;Memory or concentration      Family/Social Information:  Housing Arrangement: patient lives with his spouse. Family members/support persons in your life? Patient named his spouse, his family, and church community as his primary supports. Transportation concerns: no, patient's spouse will be transporting patient to his appointments.  Employment: Retired .  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 yes, patient understands that he will be doing chemotherapy and radiation. Concerns about diagnosis and/or treatment: None Patient reported stressors: Adjusting to my illness Patient enjoys time with family/ friends Current coping skills/ strengths: Ability for insight , Average or above average intelligence , Capable of independent living , Communication skills , General fund of knowledge , Motivation for treatment/growth , Religious Affiliation , and Supportive family/friends     SUMMARY: Current SDOH Barriers:  Mood Challenges related to diagnosis  Clinical Social Work Clinical Goal(s):  No clinical social work goals at this time  Interventions: Discussed common feeling and emotions when being diagnosed with cancer, and the importance of support during treatment Informed patient of the support team roles and support services at [facility] Provided CSW contact information and encouraged patient to call with any questions or concerns CSW discussed clinical services available through cancer center. Declined Peer mentor at this time.    Follow Up Plan: Patient will contact CSW with any support or resource needs Patient verbalizes understanding of plan: Yes    Lizbeth Sprague, LCSW Clinical Social Worker [facility]

## 2024-04-01 DIAGNOSIS — E1122 Type 2 diabetes mellitus with diabetic chronic kidney disease: Secondary | ICD-10-CM | POA: Diagnosis not present

## 2024-04-01 DIAGNOSIS — R0989 Other specified symptoms and signs involving the circulatory and respiratory systems: Secondary | ICD-10-CM | POA: Diagnosis not present

## 2024-04-01 DIAGNOSIS — Z510 Encounter for antineoplastic radiation therapy: Secondary | ICD-10-CM | POA: Diagnosis present

## 2024-04-01 DIAGNOSIS — C090 Malignant neoplasm of tonsillar fossa: Secondary | ICD-10-CM | POA: Diagnosis not present

## 2024-04-01 DIAGNOSIS — R221 Localized swelling, mass and lump, neck: Secondary | ICD-10-CM | POA: Insufficient documentation

## 2024-04-01 DIAGNOSIS — D708 Other neutropenia: Secondary | ICD-10-CM | POA: Insufficient documentation

## 2024-04-01 DIAGNOSIS — N1831 Chronic kidney disease, stage 3a: Secondary | ICD-10-CM | POA: Insufficient documentation

## 2024-04-01 DIAGNOSIS — I340 Nonrheumatic mitral (valve) insufficiency: Secondary | ICD-10-CM | POA: Insufficient documentation

## 2024-04-01 DIAGNOSIS — D696 Thrombocytopenia, unspecified: Secondary | ICD-10-CM | POA: Insufficient documentation

## 2024-04-01 DIAGNOSIS — Z5111 Encounter for antineoplastic chemotherapy: Secondary | ICD-10-CM | POA: Diagnosis not present

## 2024-04-01 DIAGNOSIS — R042 Hemoptysis: Secondary | ICD-10-CM | POA: Diagnosis not present

## 2024-04-01 DIAGNOSIS — R1310 Dysphagia, unspecified: Secondary | ICD-10-CM | POA: Diagnosis not present

## 2024-04-02 NOTE — Progress Notes (Signed)
 Pharmacist Chemotherapy Monitoring - Initial Assessment    Anticipated start date: 04/10/24   The following has been reviewed per standard work regarding the patient's treatment regimen: The patient's diagnosis, treatment plan and drug doses, and organ/hematologic function Lab orders and baseline tests specific to treatment regimen  The treatment plan start date, drug sequencing, and pre-medications Prior authorization status  Patient's documented medication list, including drug-drug interaction screen and prescriptions for anti-emetics and supportive care specific to the treatment regimen The drug concentrations, fluid compatibility, administration routes, and timing of the medications to be used The patient's access for treatment and lifetime cumulative dose history, if applicable  The patient's medication allergies and previous infusion related reactions, if applicable   Changes made to treatment plan:  N/A  Follow up needed:  N/A   Christian Bowman, RPH, 04/02/2024  9:36 AM

## 2024-04-02 NOTE — Therapy (Signed)
 OUTPATIENT PHYSICAL THERAPY HEAD AND NECK BASELINE EVALUATION   Patient Name: Christian Bowman MRN: [id number] DOB:October 31, 1945, 78 y.o., male Today's Date: 04/03/2024  END OF SESSION:  PT End of Session - 04/03/24 1049     Visit Number 1    Number of Visits 2    Date for PT Re-Evaluation 06/26/24    PT Start Time 1011    PT Stop Time 1046    PT Time Calculation (min) 35 min    Activity Tolerance Patient tolerated treatment well    Behavior During Therapy [facility] for tasks assessed/performed          Past Medical History:  Diagnosis Date   AICD (automatic cardioverter/defibrillator) present    medtronic-   DR. CROITORU , DR. ROLAN    Anginal pain (HCC)    cp sat 08/11/17   Anxiety    CAD (coronary artery disease)    CHF (congestive heart failure) (HCC)    Complication of anesthesia    took awhile to wake up    Coronary artery disease involving coronary bypass graft    Cyst of neck    right side   DM2 (diabetes mellitus, type 2) (HCC) 08/26/2013   Dyspnea    Heart attack (HCC)    not sure when (08/20/2017)   HTN (hypertension) 08/26/2013   Hyperlipidemia 08/26/2013   Hypothyroidism    Left main coronary artery disease    Left renal artery stenosis (HCC)    Genesis 6x12 stent 2007   Localized cancer of throat (HCC)    Obesity (BMI 30.0-34.9) 08/26/2013   Postoperative atrial fibrillation (HCC) 10/15/2005   Presence of permanent cardiac pacemaker    S/P CABG x 4 10/13/2005   LIMA to LAD, SVG to intermediate branch, sequential SVG to PDA and RPL branch, EVH via right thigh   S/P mitral valve replacement with bioprosthetic valve 07/11/2016   31 mm [facility] Mitral bovine bioprosthetic tissue valve   S/P redo CABG x 2 07/11/2016   SVG to PDA and SVG to Intermediate Branch, EVH via left thigh   Past Surgical History:  Procedure Laterality Date   A-FLUTTER ABLATION N/A 05/11/2017   Procedure: A-Flutter Ablation;  Surgeon: Inocencio Soyla Lunger, MD;  Location: [facility] INVASIVE  CV LAB;  Service: Cardiovascular;  Laterality: N/A;   BIV ICD GENERATOR CHANGEOUT N/A 08/29/2023   Procedure: BIV ICD GENERATOR CHANGEOUT;  Surgeon: Inocencio Soyla Lunger, MD;  Location: [facility] INVASIVE CV LAB;  Service: Cardiovascular;  Laterality: N/A;   CARDIAC CATHETERIZATION N/A 06/21/2016   Procedure: Right/Left Heart Cath and Coronary/Graft Angiography;  Surgeon: Ozell Fell, MD;  Location: [facility] INVASIVE CV LAB;  Service: Cardiovascular;  Laterality: N/A;   CARDIAC VALVE REPLACEMENT     CARDIOVERSION N/A 07/19/2016   Procedure: CARDIOVERSION;  Surgeon: Redell GORMAN Shallow, MD;  Location: [facility] ENDOSCOPY;  Service: Cardiovascular;  Laterality: N/A;   CARDIOVERSION N/A 09/08/2016   Procedure: CARDIOVERSION;  Surgeon: Vina LULLA Gull, MD;  Location: [facility] ENDOSCOPY;  Service: Cardiovascular;  Laterality: N/A;   CORONARY ANGIOPLASTY     STENT 2016  [facility]   CORONARY ANGIOPLASTY WITH STENT PLACEMENT     DES in SVG to right coronary artery system   CORONARY ARTERY BYPASS GRAFT  10/13/2005   LIMA to LAD, SVG to intermediate branch, sequential SVG to PDA and RPL   CORONARY ARTERY BYPASS GRAFT N/A 07/11/2016   Procedure: REDO CORONARY ARTERY BYPASS GRAFTING (CABG) x two using left leg greater saphenous vein harvested endoscopically-SVG to  PDA -SVG to RAMUS INTERMEDIATE;  Surgeon: Sudie VEAR Laine, MD;  Location: [facility] OR;  Service: Open Heart Surgery;  Laterality: N/A;   CORONARY ARTERY BYPASS GRAFT N/A 07/11/2016   Procedure: Re-exploration (CABG) for post op bleeding,;  Surgeon: Sudie VEAR Laine, MD;  Location: [facility] OR;  Service: Open Heart Surgery;  Laterality: N/A;   EP IMPLANTABLE DEVICE N/A 07/25/2016   Procedure: BiV ICD Insertion CRT-D;  Surgeon: Will Gladis Norton, MD;  Location: [facility] INVASIVE CV LAB;  Service: Cardiovascular;  Laterality: N/A;   INGUINAL HERNIA REPAIR Left 08/20/2017   INGUINAL HERNIA REPAIR Left 08/20/2017   Procedure: OPEN LEFT INGUINAL HERNIA REPAIR;  Surgeon: Ethyl Lenis, MD;   Location: [facility] OR;  Service: General;  Laterality: Left;   LEFT HEART CATH AND CORS/GRAFTS ANGIOGRAPHY N/A 12/18/2016   Procedure: Left Heart Cath and Cors/Grafts Angiography;  Surgeon: Ozell Fell, MD;  Location: [facility] INVASIVE CV LAB;  Service: Cardiovascular;  Laterality: N/A;   LEFT HEART CATH AND CORS/GRAFTS ANGIOGRAPHY N/A 08/22/2017   Procedure: LEFT HEART CATH AND CORS/GRAFTS ANGIOGRAPHY;  Surgeon: Rolan Ezra RAMAN, MD;  Location: [facility] INVASIVE CV LAB;  Service: Cardiovascular;  Laterality: N/A;   MITRAL VALVE REPLACEMENT N/A 07/11/2016   Procedure: MITRAL VALVE (MV) REPLACEMENT;  Surgeon: Sudie VEAR Laine, MD;  Location: [facility] OR;  Service: Open Heart Surgery;  Laterality: N/A;   MYOCARDICAL PERFUSION  10/09/2007   NORMAL PERFUSION IN ALL REGIONS;NO EVIDENCE OF INDUCIBLE ISCHEMIA;POST STRESS EF% 66   RENAL ARTERY STENT Right 2007   RENAL DOPPLER  03/28/2010   RIGHT RA-NORMAL;LEFT PROXIMAL RA AT STENT-PATENT WITH NO EVIDENCE OF SIGN DIAMETER REDUCTION. R & L KIDNEYS: EQUAL IN SIZE,SYMMETRICAL IN SHAPE.   RIGHT HEART CATH N/A 01/08/2020   Procedure: RIGHT HEART CATH;  Surgeon: Rolan Ezra RAMAN, MD;  Location: [facility] INVASIVE CV LAB;  Service: Cardiovascular;  Laterality: N/A;   TEE WITHOUT CARDIOVERSION N/A 06/15/2016   Procedure: TRANSESOPHAGEAL ECHOCARDIOGRAM (TEE);  Surgeon: Jerel Balding, MD;  Location: [facility] ENDOSCOPY;  Service: Cardiovascular;  Laterality: N/A;   TEE WITHOUT CARDIOVERSION N/A 07/11/2016   Procedure: TRANSESOPHAGEAL ECHOCARDIOGRAM (TEE);  Surgeon: Sudie VEAR Laine, MD;  Location: [facility] OR;  Service: Open Heart Surgery;  Laterality: N/A;   TEE WITHOUT CARDIOVERSION N/A 07/19/2016   Procedure: TRANSESOPHAGEAL ECHOCARDIOGRAM (TEE);  Surgeon: Redell RAMAN Shallow, MD;  Location: [facility] ENDOSCOPY;  Service: Cardiovascular;  Laterality: N/A;   TRANSESOPHAGEAL ECHOCARDIOGRAM  10/19/2005   NORMAL LV; MILD TO MODERATE AMOUNT OF SOFT ATHEROMATOUS PLAQUE OF THE THORACIC AORTA; THE LEFT ATRIUM IS MILDLY  DILATED;LEFT ATRIAL APPENDAGE FUNCTION IS NORMAL;NO THROMBUS IDENTIFIED. SMALL PFO WITH RIGHT TO LEFT SHUNT   Patient Active Problem List   Diagnosis Date Noted   CKD (chronic kidney disease) stage 3, GFR 30-59 ml/min (HCC) 03/21/2024   Thrombocytopenia (HCC) 03/21/2024   Primary cancer of tonsillar fossa (HCC) 03/04/2024   Mass in neck 02/18/2024   Chronic CHF (congestive heart failure) (HCC) 02/18/2024   Chronic health problem 02/18/2024   Hemoptysis 02/18/2024   Complete AV block (HCC) 09/13/2023   Long term current use of oral hypoglycemic drug 09/13/2023   Hypercholesterolemia 10/11/2018   Type 2 diabetes mellitus with stage 3a chronic kidney disease, without long-term current use of insulin  (HCC) 10/04/2017   Angina decubitus (HCC)    Non-ST elevation (NSTEMI) myocardial infarction [facility])    Inguinal hernia of left side without obstruction or gangrene 08/20/2017   Atrial flutter (HCC) 05/11/2017   Hypothyroidism (acquired) 02/25/2017   Encounter for monitoring  amiodarone  therapy 02/25/2017   Elevated troponin 12/15/2016   Chest pain 12/15/2016   Presence of biventricular implantable cardioverter-defibrillator (ICD) 09/11/2016   Protein-calorie malnutrition, severe 07/24/2016   Acute on chronic systolic CHF (congestive heart failure) (HCC)    S/P redo CABG x 2 07/11/2016   Coronary artery disease involving coronary bypass graft    Left main coronary artery disease    Coronary artery disease of native artery of native heart with stable angina pectoris (HCC)    Vitamin D  deficiency 08/12/2015   Chronic systolic CHF (congestive heart failure) (HCC) 07/28/2015   Mitral regurgitation 07/28/2015   Left renal artery stenosis (HCC) 08/26/2013   Essential hypertension 08/26/2013   Mixed hyperlipidemia 08/26/2013   Type 2 diabetes mellitus with vascular disease (HCC) 08/26/2013    PCP: Jerel Balding, MD  REFERRING PROVIDER: Izell Domino, MD  REFERRING DIAG: C09.0 (ICD-10-CM) -  Primary cancer of tonsillar fossa (HCC)  THERAPY DIAG:  Abnormal posture - Plan: PT plan of care cert/re-cert  Malignant neoplasm of tonsillar fossa (HCC) - Plan: PT plan of care cert/re-cert  Rationale for Evaluation and Treatment: Rehabilitation  ONSET DATE: 02/18/24  SUBJECTIVE:     SUBJECTIVE STATEMENT: Patient reports they are here today to be seen by their medical team for newly diagnosed cancer of right tonsil.    PERTINENT HISTORY:  SCC of the right tonsil, stage I (T2 N3 M0, p 16 +). He presented to the ED on 35/19/25 for an episode of hemoptysis and complaints of dysphagia. He reports a long-standing history of a large right neck mass for which he had a work up and was assured that it was benign. 02/18/24 CT neck in the ED showing a large tonsillar mass measuring 2.2 x 3.1 x 3.7 cm on the right side with necrotic lymph nodes concerning for malignancy and a 6.5 x 4.0 cm predominantly low attenuation lesion centered at the right level 2 station, concerning for a cystic/necrotic lymph node. Scan also indicated a nonspecific 5 mm cystic-appearing lesion within the vallecula on the right.  02/18/24 He saw Dr. Soldatova while in the hospital who did a Laryngoscopy showing evidence of a large exophytic mass involving the right tonsil and soft palate but without any obvious involvement of the BOT. Biopsy done the same day revealed SCC, p 16 +. 02/28/24 PET revealing a hypermetabolic mass centered in the right palatine tonsil pharyngeal region extending to the glossal tonsillar sulcus and the soft palate. No additional areas of abnormal uptake at this time to suggest spread of disease. Scan also noted small lung nodule seen previously are not well seen on the current examination but there is significant motion. He will receive 35 fractions of radiation to his right tonsil and bilateral neck with weekly chemotherapy which will start on 04/10/24. PEG/PAC planned for 04/09/24, CHF  PATIENT GOALS:   to be  educated about the signs and symptoms of lymphedema and learn post op HEP.   PAIN:  Are you having pain? No  PRECAUTIONS: Active CA and Pacemaker and defibrillator  RED FLAGS: None   WEIGHT BEARING RESTRICTIONS: No  FALLS:  Has patient fallen in last 6 months? No Does the patient have a fear of falling that limits activity? No Is the patient reluctant to leave the house due to a fear of falling?No  LIVING ENVIRONMENT: Patient lives with: wife Lives in: House/apartment Has following equipment at home: None  OCCUPATION: retired  LEISURE: 2x/wk goes to cardiac rehab  PRIOR LEVEL OF FUNCTION:  Independent   OBJECTIVE: Note: Objective measures were completed at Evaluation unless otherwise noted.  COGNITION: Overall cognitive status: Within functional limits for tasks assessed                  POSTURE:  Forward head and rounded shoulders posture  30 SEC SIT TO STAND: 8 reps in 30 sec without hands but pressed knees against back of chair initially but this improved with more reps use of UEs which is  Poor for patient's age  SHOULDER AROM:   WFL   CERVICAL AROM:   Percent limited  Flexion WFL  Extension WFL  Right lateral flexion WFL  Left lateral flexion WFL  Right rotation WFL  Left rotation WFL    (Blank rows=not tested)  GAIT: Assessed: Yes Assistance needed: Modified Independent Ambulation Distance: 10 feet Assistive Device: none Gait pattern: WFL Ambulation surface: Level  PATIENT EDUCATION:  Education details: Neck ROM, importance of posture when sitting, standing and lying down, deep breathing, walking program and importance of staying active throughout treatment, CURE article on staying active, Why exercise? flyer, lymphedema and PT info Person educated: Patient Education method: Explanation, Demonstration, Handout Education comprehension: Patient verbalized understanding and returned demonstration  HOME EXERCISE PROGRAM: Patient was instructed  today in a home exercise program today for head and neck range of motion exercises. These included active cervical flexion, active cervical extension, active cervical rotation to each direction, upper trap stretch, and shoulder retraction. Patient was encouraged to do these 2-3 times a day, holding for 5 sec each and completing for 5 reps. Pt was educated that once this becomes easier then hold the stretches for 30-60 seconds.    ASSESSMENT:  CLINICAL IMPRESSION: Pt arrives to PT with recently diagnosed R tonsillar cancer. He will receive 35 fractions of radiation to his right tonsil and bilateral neck with weekly chemotherapy which will start on 04/10/24 and end on 05/28/24. PEG/PAC planned for 04/09/24  Pt's cervical ROM was [facility]. Educated pt about signs and symptoms of lymphedema as well as anatomy and physiology of lymphatic system. Educated pt in importance of staying as active as possible throughout treatment to decrease fatigue as well as head and neck ROM exercises to decrease loss of ROM. Will see pt after completion of radiation to reassess ROM and assess for lymphedema and to determine therapy needs at that time.  Pt will benefit from skilled therapeutic intervention to improve on the following deficits: Decreased knowledge of precautions and postural dysfunction.  PT treatment/interventions: ADL/self-care home management, pt/family education, therapeutic exercise.   REHAB POTENTIAL: Good  CLINICAL DECISION MAKING: Stable/uncomplicated  EVALUATION COMPLEXITY: Low   GOALS: Goals reviewed with patient? YES  LONG TERM GOALS: (STG=LTG)   Name Target Date  Goal status  1 Patient will be able to verbalize understanding of a home exercise program for cervical range of motion, posture, and walking.   Baseline:  No knowledge 04/03/2024 Achieved at eval  2 Patient will be able to verbalize understanding of proper sitting and standing posture. Baseline:  No knowledge 04/03/2024 Achieved at eval   3 Patient will be able to verbalize understanding of lymphedema risk and availability of treatment for this condition Baseline:  No knowledge 04/03/2024 Achieved at eval  4 Pt will demonstrate a return to full cervical ROM and function post operatively compared to baselines and not demonstrate any signs or symptoms of lymphedema.  Baseline: See objective measurements taken today. 05/29/24 New    PLAN:  PT FREQUENCY/DURATION: EVAL and  1 follow up appointment.   PLAN FOR NEXT SESSION: will reassess 2 weeks after completion of radiation to determine needs.  Patient will follow up at outpatient cancer rehab 2 weeks after completion of radiation.  If the patient requires physical therapy at that time, a specific plan will be dictated and sent to the referring physician for approval. The patient was educated today on appropriate basic range of motion exercises to begin now and continue throughout radiation and educated on the signs and symptoms of lymphedema. Patient verbalized good understanding.     Physical Therapy Information for During and After Head/Neck Cancer Treatment: Lymphedema is a swelling condition that you may be at risk for in your neck and/or face if you have radiation treatment to the area and/or if you have surgery that includes removing lymph nodes.  There is treatment available for this condition and it is not life-threatening.  Contact your physician or physical therapist with concerns. An excellent resource for those seeking information on lymphedema is the National Lymphedema Network's website.  It can be accessed at www.lymphnet.org If you notice swelling in your neck or face at any time following surgery (even if it is many years from now), please contact your doctor or physical therapist to discuss this.  Lymphedema can be treated at any time but it is easier for you if it is treated early on. If you have had surgery to your neck, please check with your surgeon about how soon  to start doing neck range of motion exercises.  If you are not having surgery, I encourage you to start doing neck range of motion exercises today and continue these while undergoing treatment, UNLESS you have irritation of your skin or soft tissue that is aggravated by doing them.  These exercises are intended to help you prevent loss of range of motion and/or to gain range of motion in your neck (which can be limited by tightening effects of radiation), and NOT to aggravate these tissues if they develop sensitivities from treatment. Neck range of motion exercises should be done to the point of feeling a GENTLE, TOLERABLE stretch only.  You are encouraged to start a walking or other exercise program tomorrow and continue this as much as you are able through and after treatment.  Please feel free to call me with any questions. Florina Lanis Carbon, PT, CLT Physical Therapist and Certified Lymphedema Therapist [facility] [address], [place], [place] [address] [phone] [email or web address]  WALKING  Walking is a great form of exercise to increase your strength, endurance and overall fitness.  A walking program can help you start slowly and gradually build endurance as you go.  Everyone's ability is different, so each person's starting point will be different.  You do not have to follow them exactly.  The are just samples. You should simply find out what's right for you and stick to that program.   In the beginning, you'll start off walking 2-3 times a day for short distances.  As you get stronger, you'll be walking further at just 1-2 times per day.  A. You Can Walk For A Certain Length Of Time Each Day    Walk 5 minutes 3 times per day.  Increase 2 minutes every 2 days (3 times per day).  Work up to 25-30 minutes (1-2 times per day).   Example:   Day 1-2 5 minutes 3 times per day   Day 7-8 12 minutes 2-3 times per day  Day 13-14 25  minutes 1-2 times per day  B. You Can Walk For a Certain Distance Each Day     Distance can be substituted for time.    Example:   3 trips to mailbox (at road)   3 trips to corner of block   3 trips around the block  C. Go to local high school and use the track.    Walk for distance ____ around track  Or time ____ minutes  D. Walk ____ Jog ____ Run ___   Why exercise?  So many benefits! Here are SOME of them: Heart health, including raising your good cholesterol level and reducing heart rate and blood pressure Lung health, including improved lung capacity It burns fats, and most of us  can stand to be leaner, whether or not we are overweight. It increases the body's natural painkillers and mood elevators, so makes you feel better. Not only makes you feel better, but look better too Improves sleep Takes a bite out of stress May decrease your risk of many types of cancer If you are currently undergoing cancer treatment, exercise may improve your ability to tolerate treatments including chemotherapy. For everybody, it can improve your energy level. Those with cancer-related fatigue report a 40-50% reduction in this symptom when exercising regularly. If you are a survivor of breast, colon, or prostate cancer, it may decrease your risk of a recurrence. (This may hold for other cancers too, but so far we have data just for these three types.)  How to exercise: Get your doctor's okay. Pick something you enjoy doing, like walking, Zumba, biking, swimming, or whatever. Start at low intensity and time, then gradually increase.  (See walking program handout.) Set a goal to achieve over time.  The American Cancer Society, American Heart Association, and U.S. Dept. of Health and Human Services recommend 150 minutes of moderate exercise, 75 minutes of vigorous exercise, or a combination of both per week. This should be done in episodes at least 10 minutes long, spread throughout the  week.  Need help being motivated? Pick something you enjoy doing, because you'll be more inclined to stick with that activity than something that feels like a chore. Do it with a friend so that you are accountable to each other. Schedule it into your day. Place it on your calendar and keep that appointment just like you do any appointment that you make. Join an exercise group that meets at a specific time.  That way, you have to show up on time, and that makes it harder to procrastinate about doing your workout.  It also keeps you accountable--people begin to expect you to be there. Join a gym where you feel comfortable and not intimidated, at the right cost. Sign up for something that you'll need to be in shape for on a specific date, like a 1K or a 5K to walk or run, a 20 or 30 mile bike ride, a mud run or something like that. If the date is looming, you know you'll need to train to be ready for it.  An added benefit is that many of these are fundraisers for good causes. If you've already paid for a gym membership, group exercise class or event, you might as well work out, so you haven't wasted your money!    [facility], PT 04/03/2024, 10:53 AM

## 2024-04-03 ENCOUNTER — Other Ambulatory Visit: Payer: Self-pay

## 2024-04-03 ENCOUNTER — Ambulatory Visit: Attending: Radiation Oncology | Admitting: Physical Therapy

## 2024-04-03 ENCOUNTER — Encounter: Payer: Self-pay | Admitting: Physical Therapy

## 2024-04-03 ENCOUNTER — Ambulatory Visit: Attending: Radiation Oncology

## 2024-04-03 DIAGNOSIS — C090 Malignant neoplasm of tonsillar fossa: Secondary | ICD-10-CM | POA: Insufficient documentation

## 2024-04-03 DIAGNOSIS — R293 Abnormal posture: Secondary | ICD-10-CM | POA: Insufficient documentation

## 2024-04-03 DIAGNOSIS — R1310 Dysphagia, unspecified: Secondary | ICD-10-CM | POA: Diagnosis present

## 2024-04-03 NOTE — Therapy (Signed)
 OUTPATIENT SPEECH LANGUAGE PATHOLOGY ONCOLOGY EVALUATION   Patient Name: Christian Bowman MRN: [id number] DOB:[birth year], 78 y.o., male Today's Date: 04/03/2024  PCP: Francyne Headland, MD REFERRING PROVIDER: Izell Domino, MD  END OF SESSION:  End of Session - 04/03/24 1055     Visit Number 1    Number of Visits 3    Date for SLP Re-Evaluation 07/02/24    SLP Start Time 0920    SLP Stop Time  1008    SLP Time Calculation (min) 48 min    Activity Tolerance Patient tolerated treatment well          Past Medical History:  Diagnosis Date   AICD (automatic cardioverter/defibrillator) present    medtronic-   DR. CROITORU , DR. ROLAN    Anginal pain (HCC)    cp sat 08/11/17   Anxiety    CAD (coronary artery disease)    CHF (congestive heart failure) (HCC)    Complication of anesthesia    took awhile to wake up    Coronary artery disease involving coronary bypass graft    Cyst of neck    right side   DM2 (diabetes mellitus, type 2) (HCC) 08/26/2013   Dyspnea    Heart attack (HCC)    not sure when (08/20/2017)   HTN (hypertension) 08/26/2013   Hyperlipidemia 08/26/2013   Hypothyroidism    Left main coronary artery disease    Left renal artery stenosis (HCC)    Genesis 6x12 stent 2007   Localized cancer of throat (HCC)    Obesity (BMI 30.0-34.9) 08/26/2013   Postoperative atrial fibrillation (HCC) 10/15/2005   Presence of permanent cardiac pacemaker    S/P CABG x 4 10/13/2005   LIMA to LAD, SVG to intermediate branch, sequential SVG to PDA and RPL branch, EVH via right thigh   S/P mitral valve replacement with bioprosthetic valve 07/11/2016   31 mm [facility] Mitral bovine bioprosthetic tissue valve   S/P redo CABG x 2 07/11/2016   SVG to PDA and SVG to Intermediate Branch, EVH via left thigh   Past Surgical History:  Procedure Laterality Date   A-FLUTTER ABLATION N/A 05/11/2017   Procedure: A-Flutter Ablation;  Surgeon: Inocencio Soyla Lunger, MD;  Location: [facility]  INVASIVE CV LAB;  Service: Cardiovascular;  Laterality: N/A;   BIV ICD GENERATOR CHANGEOUT N/A 08/29/2023   Procedure: BIV ICD GENERATOR CHANGEOUT;  Surgeon: Inocencio Soyla Lunger, MD;  Location: [facility] INVASIVE CV LAB;  Service: Cardiovascular;  Laterality: N/A;   CARDIAC CATHETERIZATION N/A 06/21/2016   Procedure: Right/Left Heart Cath and Coronary/Graft Angiography;  Surgeon: Ozell Fell, MD;  Location: [facility] INVASIVE CV LAB;  Service: Cardiovascular;  Laterality: N/A;   CARDIAC VALVE REPLACEMENT     CARDIOVERSION N/A 07/19/2016   Procedure: CARDIOVERSION;  Surgeon: Redell GORMAN Shallow, MD;  Location: [facility] ENDOSCOPY;  Service: Cardiovascular;  Laterality: N/A;   CARDIOVERSION N/A 09/08/2016   Procedure: CARDIOVERSION;  Surgeon: Vina LULLA Gull, MD;  Location: [facility] ENDOSCOPY;  Service: Cardiovascular;  Laterality: N/A;   CORONARY ANGIOPLASTY     STENT 2016  [facility]   CORONARY ANGIOPLASTY WITH STENT PLACEMENT     DES in SVG to right coronary artery system   CORONARY ARTERY BYPASS GRAFT  10/13/2005   LIMA to LAD, SVG to intermediate branch, sequential SVG to PDA and RPL   CORONARY ARTERY BYPASS GRAFT N/A 07/11/2016   Procedure: REDO CORONARY ARTERY BYPASS GRAFTING (CABG) x two using left leg greater saphenous vein harvested endoscopically-SVG to PDA -SVG  to RAMUS INTERMEDIATE;  Surgeon: Sudie VEAR Laine, MD;  Location: [facility] OR;  Service: Open Heart Surgery;  Laterality: N/A;   CORONARY ARTERY BYPASS GRAFT N/A 07/11/2016   Procedure: Re-exploration (CABG) for post op bleeding,;  Surgeon: Sudie VEAR Laine, MD;  Location: [facility] OR;  Service: Open Heart Surgery;  Laterality: N/A;   EP IMPLANTABLE DEVICE N/A 07/25/2016   Procedure: BiV ICD Insertion CRT-D;  Surgeon: Will Gladis Norton, MD;  Location: [facility] INVASIVE CV LAB;  Service: Cardiovascular;  Laterality: N/A;   INGUINAL HERNIA REPAIR Left 08/20/2017   INGUINAL HERNIA REPAIR Left 08/20/2017   Procedure: OPEN LEFT INGUINAL HERNIA REPAIR;  Surgeon: Ethyl Lenis, MD;  Location: [facility] OR;  Service: General;  Laterality: Left;   LEFT HEART CATH AND CORS/GRAFTS ANGIOGRAPHY N/A 12/18/2016   Procedure: Left Heart Cath and Cors/Grafts Angiography;  Surgeon: Ozell Fell, MD;  Location: [facility] INVASIVE CV LAB;  Service: Cardiovascular;  Laterality: N/A;   LEFT HEART CATH AND CORS/GRAFTS ANGIOGRAPHY N/A 08/22/2017   Procedure: LEFT HEART CATH AND CORS/GRAFTS ANGIOGRAPHY;  Surgeon: Rolan Ezra RAMAN, MD;  Location: [facility] INVASIVE CV LAB;  Service: Cardiovascular;  Laterality: N/A;   MITRAL VALVE REPLACEMENT N/A 07/11/2016   Procedure: MITRAL VALVE (MV) REPLACEMENT;  Surgeon: Sudie VEAR Laine, MD;  Location: [facility] OR;  Service: Open Heart Surgery;  Laterality: N/A;   MYOCARDICAL PERFUSION  10/09/2007   NORMAL PERFUSION IN ALL REGIONS;NO EVIDENCE OF INDUCIBLE ISCHEMIA;POST STRESS EF% 66   RENAL ARTERY STENT Right 2007   RENAL DOPPLER  03/28/2010   RIGHT RA-NORMAL;LEFT PROXIMAL RA AT STENT-PATENT WITH NO EVIDENCE OF SIGN DIAMETER REDUCTION. R & L KIDNEYS: EQUAL IN SIZE,SYMMETRICAL IN SHAPE.   RIGHT HEART CATH N/A 01/08/2020   Procedure: RIGHT HEART CATH;  Surgeon: Rolan Ezra RAMAN, MD;  Location: [facility] INVASIVE CV LAB;  Service: Cardiovascular;  Laterality: N/A;   TEE WITHOUT CARDIOVERSION N/A 06/15/2016   Procedure: TRANSESOPHAGEAL ECHOCARDIOGRAM (TEE);  Surgeon: Jerel Balding, MD;  Location: [facility] ENDOSCOPY;  Service: Cardiovascular;  Laterality: N/A;   TEE WITHOUT CARDIOVERSION N/A 07/11/2016   Procedure: TRANSESOPHAGEAL ECHOCARDIOGRAM (TEE);  Surgeon: Sudie VEAR Laine, MD;  Location: [facility] OR;  Service: Open Heart Surgery;  Laterality: N/A;   TEE WITHOUT CARDIOVERSION N/A 07/19/2016   Procedure: TRANSESOPHAGEAL ECHOCARDIOGRAM (TEE);  Surgeon: Redell RAMAN Shallow, MD;  Location: [facility] ENDOSCOPY;  Service: Cardiovascular;  Laterality: N/A;   TRANSESOPHAGEAL ECHOCARDIOGRAM  10/19/2005   NORMAL LV; MILD TO MODERATE AMOUNT OF SOFT ATHEROMATOUS PLAQUE OF THE THORACIC AORTA; THE LEFT ATRIUM IS  MILDLY DILATED;LEFT ATRIAL APPENDAGE FUNCTION IS NORMAL;NO THROMBUS IDENTIFIED. SMALL PFO WITH RIGHT TO LEFT SHUNT   Patient Active Problem List   Diagnosis Date Noted   CKD (chronic kidney disease) stage 3, GFR 30-59 ml/min (HCC) 03/21/2024   Thrombocytopenia (HCC) 03/21/2024   Primary cancer of tonsillar fossa (HCC) 03/04/2024   Mass in neck 02/18/2024   Chronic CHF (congestive heart failure) (HCC) 02/18/2024   Chronic health problem 02/18/2024   Hemoptysis 02/18/2024   Complete AV block (HCC) 09/13/2023   Long term current use of oral hypoglycemic drug 09/13/2023   Hypercholesterolemia 10/11/2018   Type 2 diabetes mellitus with stage 3a chronic kidney disease, without long-term current use of insulin  (HCC) 10/04/2017   Angina decubitus (HCC)    Non-ST elevation (NSTEMI) myocardial infarction [facility])    Inguinal hernia of left side without obstruction or gangrene 08/20/2017   Atrial flutter (HCC) 05/11/2017   Hypothyroidism (acquired) 02/25/2017   Encounter for monitoring amiodarone  therapy  02/25/2017   Elevated troponin 12/15/2016   Chest pain 12/15/2016   Presence of biventricular implantable cardioverter-defibrillator (ICD) 09/11/2016   Protein-calorie malnutrition, severe 07/24/2016   Acute on chronic systolic CHF (congestive heart failure) (HCC)    S/P redo CABG x 2 07/11/2016   Coronary artery disease involving coronary bypass graft    Left main coronary artery disease    Coronary artery disease of native artery of native heart with stable angina pectoris (HCC)    Vitamin D  deficiency 08/12/2015   Chronic systolic CHF (congestive heart failure) (HCC) 07/28/2015   Mitral regurgitation 07/28/2015   Left renal artery stenosis (HCC) 08/26/2013   Essential hypertension 08/26/2013   Mixed hyperlipidemia 08/26/2013   Type 2 diabetes mellitus with vascular disease (HCC) 08/26/2013    ONSET DATE: Script  03/26/24  REFERRING DIAG: Primary Cancer of Tonsillar Fossa  THERAPY DIAG:   Dysphagia, unspecified type  Rationale for Evaluation and Treatment: Rehabilitation  SUBJECTIVE:   SUBJECTIVE STATEMENT  Pt reports if he eats too quickly or has drier bread he will cough.  Pt accompanied by: significant other  PERTINENT HISTORY:  SCC of the right tonsil, stage I (T2 N3 M0, p 16 +). He presented to the ED on 02/18/24 for an episode of hemoptysis and complaints of dysphagia. He reports a long-standing history of a large right neck mass for which he had a work up and was assured that it was benign. 02/18/24 CT neck in the ED showing a large tonsillar mass measuring 2.2 x 3.1 x 3.7 cm on the right side with necrotic lymph nodes concerning for malignancy and a 6.5 x 4.0 cm predominantly low attenuation lesion centered at the right level 2 station, concerning for a cystic/necrotic lymph node. Scan also indicated a nonspecific 5 mm cystic-appearing lesion within the vallecula on the right. 02/18/24 He saw Dr. Soldatova while in the hospital who did a Laryngoscopy showing evidence of a large exophytic mass involving the right tonsil and soft palate but without any obvious involvement of the BOT. Biopsy done the same day revealed SCC, p 16 +. 02/28/24 PET revealing a hypermetabolic mass centered in the right palatine tonsil pharyngeal region extending to the glossal tonsillar sulcus and the soft palate. No additional areas of abnormal uptake at this time to suggest spread of disease. Scan also noted small lung nodule seen previously are not well seen on the current examination but there is significant motion. Radiation consult 6/3 and medical oncology consult 6/20. He will receive chemotherapy with radiation.  Treatment plan:  He will receive 35 fractions of radiation to his right tonsil and bilateral neck with weekly chemotherapy which will start on 04/10/24 and will complete 05/28/24. Pretreatment procedures: PEG/PAC planned for 04/09/24. Pt had CVA in 2018, resulting in speech changes.  PAIN:   Are you having pain? No  FALLS: Has patient fallen in last 6 months?  No  LIVING ENVIRONMENT: Lives with: lives with their spouse Lives in: House/apartment  PLOF:  Level of assistance: Independent with ADLs, Independent with IADLs Employment: Retired  PATIENT GOALS: Maintain WNL swallowing  OBJECTIVE:  Note: Objective measures were completed at Evaluation unless otherwise noted. DIAGNOSTIC FINDINGS:  See pertinent history above  INSTRUMENTAL SWALLOW STUDY FINDINGS (MBSS) none noted at this time  COGNITION: Overall cognitive status: Within functional limits for tasks assessed  LANGUAGE: Receptive and Expressive language appeared WNL.  ORAL MOTOR EXAMINATION: Overall status: Impaired: Lingual: Bilateral (Coordination) Velum: Symmetry Comments: Velar movement (right) appeared mildly impaired  MOTOR  SPEECH: Overall motor speech: impaired Level of impairment: Word, Phrase, Sentence, and Conversation Respiration: thoracic breathing and diaphragmatic/abdominal breathing Phonation: hoarse (mild, intermittent) Resonance: hypernasality, nasal emission evident in connected speech. Wife things this has been present to some degree since CVA in 2018, possibly worsened in last couple months. Articulation: Appears intact Intelligibility: Intelligible Motor planning: Appears intact Effective technique: over articulate lessened impact of nasal emission.  SUBJECTIVE DYSPHAGIA REPORTS:  Date of onset: over a year ago Reported symptoms: coughing with solids if pt has dry bread, or eats too quickly  Current diet: regular and thin liquids  Co-morbid voice changes: Yes  FACTORS WHICH MAY INCREASE RISK OF ADVERSE EVENT IN PRESENCE OF ASPIRATION:  General health: well appearing, but undergoing ChRT for tonsillar cancer  Risk factors: none evident currently but PEG placed 04/09/24  CLINICAL SWALLOW ASSESSMENT:   Dentition: adequate natural dentition Vocal quality at baseline: mild  hoarse, intermittent Patient directly observed with POs: Yes: dysphagia 3 (soft) and thin liquids  Feeding: able to feed self Liquids provided by: cup Oral phase signs and symptoms: none noted - SLP questioned pt specifically about nasal regurgitation with liquids and solids given pt's nasal emission during speech Pharyngeal phase signs and symptoms: none seen today with ham sandwich and water                                                                                                                            TREATMENT DATE:   04/03/24: Research states the risk for dysphagia increases due to radiation and/or chemotherapy treatment due to a variety of factors, so SLP educated the pt about the possibility of reduced/limited ability for PO intake during rad tx. SLP also educated pt regarding possible changes to swallowing musculature after rad tx, and why adherence to dysphagia HEP provided today and PO consumption was necessary to inhibit muscle fibrosis following rad tx and to mitigate muscle disuse atrophy. SLP informed pt why this would be detrimental to their swallowing status and to their pulmonary health. Pt demonstrated understanding of these things to SLP. SLP encouraged pt to safely eat and drink as deep into their radiation/chemotherapy as possible to provide the best possible long-term swallowing outcome for pt. SLP then developed an individualized HEP for pt involving oral and pharyngeal strengthening and ROM and pt was instructed how to perform these exercises, including SLP demonstration. After SLP demonstration, pt return demonstrated each exercise. SLP ensured pt performance was correct prior to educating pt on next exercise. Pt required usual min-mod cues faded to modified independent to perform HEP. Pt was instructed to complete this program 5-7 days/week, at least 20 reps a day until 6 months after his last day of rad tx, and then x2 a week after that, indefinitely. Among other  modifications for days when pt cannot functionally swallow, SLP also suggested pt to perform only non-swallowing tasks on the handout/HEP, and if necessary to cycle through the swallowing portion so the full program  of exercises can be completed instead of fatiguing on one of the swallowing exercises and being unable to perform the other swallowing exercises. SLP instructed that swallowing exercises should then be added back into the regimen as pt is able to do so.   PATIENT EDUCATION: Education details: late effects head/neck radiation on swallow function, HEP procedure, and modification to HEP when difficulty experienced with swallowing during and after radiation course Person educated: Patient and Spouse Education method: Explanation, Demonstration, Verbal cues, and Handouts Education comprehension: verbalized understanding, returned demonstration, verbal cues required, and needs further education   ASSESSMENT:  CLINICAL IMPRESSION: Patient is a 78 y.o. M who was seen today for assessment of swallowing as they undergo radiation/chemoradiation therapy. Today pt ate [place] sandwich and drank thin liquids without overt s/s oral or pharyngeal difficulty. At this time pt swallowing is deemed [facility] with these POs. No oral or overt s/sx pharyngeal deficits, including aspiration were observed. Pt did report coughing if he eats too fast or has drier bread. SLP to monitor - assumed as pt's tumor shrinks these sx will subside. There are no overt s/s aspiration PNA observed by SLP nor any reported by pt at this time. Data indicate that pt's swallow ability will likely decrease over the course of radiation/chemoradiation therapy and could very well decline over time following the conclusion of that therapy due to muscle disuse atrophy and/or muscle fibrosis. Pt will cont to need to be seen by SLP in order to assess safety of PO intake, assess the need for recommending any objective swallow assessment, and ensuring  pt is correctly completing the individualized HEP. Additionally pt presents with nasal emission during connected speech. SLP to monitor.  OBJECTIVE IMPAIRMENTS: include dysphagia. These impairments are limiting patient from safety when swallowing. Factors affecting potential to achieve goals and functional outcome are none noted today. Patient will benefit from skilled SLP services to address above impairments and improve overall function.   REHAB POTENTIAL: Good     GOALS: Goals reviewed with patient? No   SHORT TERM GOALS: Target: 3rd total session   1. Pt will complete HEP with modified independence in 2 sessions Baseline: Goal status: INITIAL   2.  pt will tell SLP why pt is completing HEP with modified independence Baseline:  Goal status: INITIAL   3.  pt will describe 3 overt s/s aspiration PNA with modified independence Baseline:  Goal status: INITIAL   4.  pt will tell SLP how a food journal could hasten return to a more normalized diet Baseline:  Goal status: INITIAL     LONG TERM GOALS: Target: 7th total session   1.  pt will complete HEP with independence over two visits Baseline:  Goal status: INITIAL   2.  pt will describe how to modify HEP over time, and the timeline associated with reduction in HEP frequency with modified independence over two sessions Baseline:  Goal status: INITIAL     PLAN:   SLP FREQUENCY:  once approx every 4 weeks   SLP DURATION:  7 sessions   PLANNED INTERVENTIONS: Aspiration precaution training, Pharyngeal strengthening exercises, Diet toleration management , Trials of upgraded texture/liquids, SLP instruction and feedback, Compensatory strategies, and Patient/family education, [phone] (treatment of swallowing dysfunction and/or oral function for feeding)    Wyndi Northrup, CCC-SLP 04/03/2024, 10:58 AM

## 2024-04-07 ENCOUNTER — Other Ambulatory Visit (HOSPITAL_COMMUNITY): Payer: Self-pay | Admitting: Cardiology

## 2024-04-07 ENCOUNTER — Other Ambulatory Visit: Payer: Self-pay | Admitting: Radiology

## 2024-04-07 DIAGNOSIS — C090 Malignant neoplasm of tonsillar fossa: Secondary | ICD-10-CM

## 2024-04-08 NOTE — H&P (Signed)
 Chief Complaint: Squamous cell carcinoma of the right tonsil; referred for Port-A-Cath and gastrostomy tube placements while undergoing treatment  Referring Provider(s): Pasam,A  Supervising Physician: Hughes Simmonds  Patient Status: [facility] - Out-pt  History of Present Illness: Christian [facility]man is a 78 y.o. 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  He is scheduled today for Port-A-Cath and gastrostomy tube placements undergoing treatment.  *** Patient is Full Code  Past Medical History:  Diagnosis Date   AICD (automatic cardioverter/defibrillator) present    medtronic-   DR. CROITORU , DR. ROLAN    Anginal pain (HCC)    cp sat 08/11/17   Anxiety    CAD (coronary artery disease)    CHF (congestive heart failure) (HCC)    Complication of anesthesia    took awhile to wake up    Coronary artery disease involving coronary bypass graft    Cyst of neck    right side   DM2 (diabetes mellitus, type 2) (HCC) 08/26/2013   Dyspnea    Heart attack (HCC)    not sure when (08/20/2017)   HTN (hypertension) 08/26/2013   Hyperlipidemia 08/26/2013   Hypothyroidism    Left main coronary artery disease    Left renal artery stenosis (HCC)    Genesis 6x12 stent 2007   Localized cancer of throat (HCC)    Obesity (BMI 30.0-34.9) 08/26/2013   Postoperative atrial fibrillation (HCC) 10/15/2005   Presence of permanent cardiac pacemaker    S/P CABG x 4 10/13/2005   LIMA to LAD, SVG to intermediate branch, sequential SVG to PDA and RPL branch, EVH via right thigh   S/P mitral valve replacement with bioprosthetic valve 07/11/2016   31 mm [facility] Mitral  bovine bioprosthetic tissue valve   S/P redo CABG x 2 07/11/2016   SVG to PDA and SVG to Intermediate Branch, EVH via left thigh    Past Surgical History:  Procedure Laterality Date   A-FLUTTER ABLATION N/A 05/11/2017   Procedure: A-Flutter Ablation;  Surgeon: Inocencio Soyla Lunger, MD;  Location: [facility] INVASIVE CV LAB;  Service: Cardiovascular;  Laterality: N/A;   BIV ICD GENERATOR CHANGEOUT N/A 08/29/2023   Procedure: BIV ICD GENERATOR CHANGEOUT;  Surgeon: Inocencio Soyla Lunger, MD;  Location: [facility] INVASIVE CV LAB;  Service: Cardiovascular;  Laterality: N/A;   CARDIAC CATHETERIZATION N/A 06/21/2016   Procedure: Right/Left Heart Cath and Coronary/Graft Angiography;  Surgeon: Ozell Fell, MD;  Location: [facility] INVASIVE CV LAB;  Service: Cardiovascular;  Laterality: N/A;   CARDIAC VALVE REPLACEMENT     CARDIOVERSION N/A 07/19/2016   Procedure: CARDIOVERSION;  Surgeon: Redell GORMAN Shallow, MD;  Location: [facility] ENDOSCOPY;  Service: Cardiovascular;  Laterality: N/A;   CARDIOVERSION N/A 09/08/2016   Procedure: CARDIOVERSION;  Surgeon: Vina LULLA Gull, MD;  Location: [facility] ENDOSCOPY;  Service: Cardiovascular;  Laterality: N/A;   CORONARY ANGIOPLASTY     STENT 2016  [facility]   CORONARY ANGIOPLASTY WITH STENT PLACEMENT     DES in SVG to right coronary artery system   CORONARY ARTERY BYPASS GRAFT  10/13/2005   LIMA to LAD, SVG to intermediate branch, sequential SVG to PDA and RPL   CORONARY ARTERY BYPASS GRAFT N/A 07/11/2016   Procedure: REDO CORONARY ARTERY BYPASS GRAFTING (CABG)  x two using left leg greater saphenous vein harvested endoscopically-SVG to PDA -SVG to RAMUS INTERMEDIATE;  Surgeon: Sudie VEAR Laine, MD;  Location: [facility] OR;  Service: Open Heart Surgery;  Laterality: N/A;   CORONARY ARTERY BYPASS GRAFT N/A 07/11/2016   Procedure: Re-exploration (CABG) for post op bleeding,;  Surgeon: Sudie VEAR Laine, MD;  Location: [facility] OR;  Service: Open Heart Surgery;  Laterality: N/A;   EP IMPLANTABLE DEVICE N/A  07/25/2016   Procedure: BiV ICD Insertion CRT-D;  Surgeon: Will Gladis Norton, MD;  Location: [facility] INVASIVE CV LAB;  Service: Cardiovascular;  Laterality: N/A;   INGUINAL HERNIA REPAIR Left 08/20/2017   INGUINAL HERNIA REPAIR Left 08/20/2017   Procedure: OPEN LEFT INGUINAL HERNIA REPAIR;  Surgeon: Ethyl Lenis, MD;  Location: [facility] OR;  Service: General;  Laterality: Left;   LEFT HEART CATH AND CORS/GRAFTS ANGIOGRAPHY N/A 12/18/2016   Procedure: Left Heart Cath and Cors/Grafts Angiography;  Surgeon: Ozell Fell, MD;  Location: [facility] INVASIVE CV LAB;  Service: Cardiovascular;  Laterality: N/A;   LEFT HEART CATH AND CORS/GRAFTS ANGIOGRAPHY N/A 08/22/2017   Procedure: LEFT HEART CATH AND CORS/GRAFTS ANGIOGRAPHY;  Surgeon: Rolan Ezra RAMAN, MD;  Location: [facility] INVASIVE CV LAB;  Service: Cardiovascular;  Laterality: N/A;   MITRAL VALVE REPLACEMENT N/A 07/11/2016   Procedure: MITRAL VALVE (MV) REPLACEMENT;  Surgeon: Sudie VEAR Laine, MD;  Location: [facility] OR;  Service: Open Heart Surgery;  Laterality: N/A;   MYOCARDICAL PERFUSION  10/09/2007   NORMAL PERFUSION IN ALL REGIONS;NO EVIDENCE OF INDUCIBLE ISCHEMIA;POST STRESS EF% 66   RENAL ARTERY STENT Right 2007   RENAL DOPPLER  03/28/2010   RIGHT RA-NORMAL;LEFT PROXIMAL RA AT STENT-PATENT WITH NO EVIDENCE OF SIGN DIAMETER REDUCTION. R & L KIDNEYS: EQUAL IN SIZE,SYMMETRICAL IN SHAPE.   RIGHT HEART CATH N/A 01/08/2020   Procedure: RIGHT HEART CATH;  Surgeon: Rolan Ezra RAMAN, MD;  Location: [facility] INVASIVE CV LAB;  Service: Cardiovascular;  Laterality: N/A;   TEE WITHOUT CARDIOVERSION N/A 06/15/2016   Procedure: TRANSESOPHAGEAL ECHOCARDIOGRAM (TEE);  Surgeon: Jerel Balding, MD;  Location: [facility] ENDOSCOPY;  Service: Cardiovascular;  Laterality: N/A;   TEE WITHOUT CARDIOVERSION N/A 07/11/2016   Procedure: TRANSESOPHAGEAL ECHOCARDIOGRAM (TEE);  Surgeon: Sudie VEAR Laine, MD;  Location: [facility] OR;  Service: Open Heart Surgery;  Laterality: N/A;   TEE WITHOUT CARDIOVERSION N/A 07/19/2016    Procedure: TRANSESOPHAGEAL ECHOCARDIOGRAM (TEE);  Surgeon: Redell RAMAN Shallow, MD;  Location: [facility] ENDOSCOPY;  Service: Cardiovascular;  Laterality: N/A;   TRANSESOPHAGEAL ECHOCARDIOGRAM  10/19/2005   NORMAL LV; MILD TO MODERATE AMOUNT OF SOFT ATHEROMATOUS PLAQUE OF THE THORACIC AORTA; THE LEFT ATRIUM IS MILDLY DILATED;LEFT ATRIAL APPENDAGE FUNCTION IS NORMAL;NO THROMBUS IDENTIFIED. SMALL PFO WITH RIGHT TO LEFT SHUNT    Allergies: Xanax [alprazolam]  Medications: Prior to Admission medications   Medication Sig Start Date End Date Taking? Authorizing Provider  acetaminophen  (TYLENOL ) 325 MG tablet Take 325 mg by mouth every 6 (six) hours as needed for mild pain (pain score 1-3).    [provider]  apixaban  (ELIQUIS ) 5 MG TABS tablet Take 1 tablet (5 mg total) by mouth 2 (two) times daily. 01/23/24   Croitoru, Mihai, MD  Blood Glucose Monitoring Suppl (ACCU-CHEK GUIDE ME) w/Device KIT 1 Piece by Does not apply route as directed. 02/01/22   Nida, Gebreselassie W, MD  carvedilol  (COREG ) 12.5 MG tablet TAKE 1 & 1/2 (ONE & ONE-HALF) TABLETS BY MOUTH TWICE DAILY WITH A MEAL 02/20/24   Rolan Ezra RAMAN, MD  Cholecalciferol  (VITAMIN D ) 50 MCG (  2000 UT) tablet Take 2,000 Units by mouth daily with breakfast.    [provider]  dapagliflozin  propanediol (FARXIGA ) 10 MG TABS tablet Take 1 tablet (10 mg total) by mouth daily. 12/26/23   Croitoru, Mihai, MD  dexamethasone  (DECADRON ) 4 MG tablet Take 2 tablets daily for 2 days, start the day after chemotherapy. Take with food. 03/21/24   Pasam, Chinita, MD  digoxin  (LANOXIN ) 0.125 MG tablet Take 1/2 (one-half) tablet by mouth once daily 12/26/23   Croitoru, Mihai, MD  eplerenone  (INSPRA ) 50 MG tablet Take 1 tablet (50 mg total) by mouth daily. 06/11/23   Rolan Ezra RAMAN, MD  furosemide  (LASIX ) 20 MG tablet Take 1 tablet (20 mg total) by mouth daily as needed (for a weight over 190 lbs). 12/26/23   Croitoru, Jerel, MD  glipiZIDE  (GLUCOTROL  XL) 2.5 MG 24  hr tablet Take 1 tablet (2.5 mg total) by mouth daily with breakfast. 03/13/24   Nida, Gebreselassie W, MD  glucose blood (ACCU-CHEK GUIDE TEST) test strip Use to test blood glucose daily as instructed 03/17/24   Nida, Gebreselassie W, MD  levothyroxine  (SYNTHROID ) 50 MCG tablet Take 1 tablet (50 mcg total) by mouth daily before breakfast. 03/19/24   Lenis Ethelle ORN, MD  lidocaine -prilocaine  (EMLA ) cream Apply to affected area once 03/21/24   Pasam, Avinash, MD  neomycin-bacitracin-polymyxin (NEOSPORIN) ointment Apply 1 application  topically daily as needed for wound care.    [provider]  nitroGLYCERIN  (NITROSTAT ) 0.4 MG SL tablet DISSOLVE 1 TABLET UNDER THE TONGUE EVERY 5 MINUTES AS NEEDED FOR CHEST PAIN, DO NOT EXCEED 3 DOSES IN 15 MINUTES 12/26/23   Croitoru, Mihai, MD  ondansetron  (ZOFRAN ) 8 MG tablet Take 1 tablet (8 mg total) by mouth every 8 (eight) hours as needed for nausea or vomiting. Start on the third day after chemotherapy. 03/21/24   Pasam, Avinash, MD  polyethylene glycol (MIRALAX ) 17 g packet Take 17 g by mouth daily as needed for mild constipation or moderate constipation.    [provider]  prochlorperazine  (COMPAZINE ) 10 MG tablet Take 1 tablet (10 mg total) by mouth every 6 (six) hours as needed for nausea or vomiting. 03/21/24   Pasam, Avinash, MD  rosuvastatin  (CRESTOR ) 10 MG tablet Take 1 tablet (10 mg total) by mouth daily. 06/11/23   Rolan Ezra RAMAN, MD  sacubitril -valsartan  (ENTRESTO ) 97-103 MG Take 1 tablet by mouth twice daily 04/07/24   Rolan Ezra RAMAN, MD  sodium zirconium cyclosilicate  (LOKELMA ) 10 g PACK packet DISSOLVE 1 PACKET IN WATER & DRINK ONCE DAILY 09/24/23   Croitoru, Jerel, MD  Tetrahydrozoline HCl (VISINE OP) Place 1 drop into both eyes daily as needed (irritation).    [provider]     Family History  Problem Relation Age of Onset   Heart attack Mother    Heart disease Mother    Heart attack Father    Heart disease  Father    Hypertension Father    Hypertension Sister    Heart disease Maternal Grandmother     Social History   Socioeconomic History   Marital status: Married    Spouse name: Not on file   Number of children: 0   Years of education: 18   Highest education level: Not on file  Occupational History   Occupation: retired  Tobacco Use   Smoking status: Never   Smokeless tobacco: Never  Vaping Use   Vaping status: Never Used  Substance and Sexual Activity   Alcohol use: No  Drug use: No   Sexual activity: Never  Other Topics Concern   Not on file  Social History Narrative   Right handed   Two story home   Drinks half and half coffee   Social Drivers of Health   Financial Resource Strain: Not on file  Food Insecurity: No Food Insecurity (03/21/2024)   Hunger Vital Sign    Worried About Running Out of Food in the Last Year: Never true    Ran Out of Food in the Last Year: Never true  Transportation Needs: No Transportation Needs (03/21/2024)   PRAPARE - Administrator, Civil Service (Medical): No    Lack of Transportation (Non-Medical): No  Physical Activity: Not on file  Stress: Not on file  Social Connections: Moderately Integrated (02/18/2024)   Social Connection and Isolation Panel    Frequency of Communication with Friends and Family: Once a week    Frequency of Social Gatherings with Friends and Family: Twice a week    Attends Religious Services: More than 4 times per year    Active Member of [employer] or Organizations: No    Attends Banker Meetings: Never    Marital Status: Married       Review of Systems  Vital Signs:   Advance Care Plan: No documents on file  Physical Exam  Imaging: No results found.  Labs:  CBC: Recent Labs    08/21/23 1307 02/18/24 0350 02/19/24 0711 03/21/24 0947  WBC 6.3 10.8* 7.2 7.6  HGB 16.0 16.0 15.4 16.0  HCT 48.3 48.9 46.9 48.7  PLT 132* 140* 121* 123*    COAGS: Recent Labs     02/18/24 0450  INR 1.4*    BMP: Recent Labs    06/07/23 1550 08/21/23 1306 08/21/23 1307 02/18/24 0515 02/19/24 0711 03/21/24 0947  NA 137   < > 140 136 139 139  K 4.6   < > 4.6 4.2 4.4 4.4  CL 103   < > 101 105 107 104  CO2 26   < > 23 21* 25 30  GLUCOSE 117*   < > 98 161* 102* 134*  BUN 30*   < > 37* 34* 30* 30*  CALCIUM  9.6   < > 9.3 9.0 8.9 9.6  CREATININE 1.56*   < > 1.67* 1.59* 1.75* 1.63*  GFRNONAA 46*  --   --  45* 40* 43*   < > = values in this interval not displayed.    LIVER FUNCTION TESTS: Recent Labs    08/21/23 1306 02/18/24 0515 03/21/24 0947  BILITOT 0.8 1.1 0.7  AST 22 20 17   ALT 34 19 23  ALKPHOS 78 48 59  PROT 6.8 6.3* 7.6  ALBUMIN  4.3 3.3* 4.4    TUMOR MARKERS: No results for input(s): AFPTM, CEA, CA199, CHROMGRNA in the last 8760 hours.  Assessment and Plan: 78 y.o. 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  He is scheduled today for Port-A-Cath and gastrostomy tube placements undergoing treatment.Risks and benefits image guided gastrostomy tube/port a cath placements were discussed with the patient including, but not limited to the need for a barium enema during the procedure, bleeding, infection, peritonitis, venous thrombosis and/or damage to adjacent structures.  All  of the patient's questions were answered, patient is agreeable to proceed.  Consent signed and in chart.    Thank you for allowing our service to participate in ABYAN CADMAN 's care.  Electronically Signed: D. Franky Rakers, PA-C   04/08/2024, 4:05 PM      I spent a total of  30 Minutes   in face to face in clinical  consultation, greater than 50% of which was counseling/coordinating care for Port-A-Cath and gastrostomy tube placements

## 2024-04-09 ENCOUNTER — Inpatient Hospital Stay (HOSPITAL_COMMUNITY)
Admission: RE | Admit: 2024-04-09 | Discharge: 2024-04-09 | Disposition: A | Discharge status: Home / Self Care | Source: Ambulatory Visit | Attending: Oncology

## 2024-04-09 ENCOUNTER — Ambulatory Visit (HOSPITAL_COMMUNITY)
Admission: RE | Admit: 2024-04-09 | Discharge: 2024-04-09 | Disposition: A | Discharge status: Home / Self Care | Source: Ambulatory Visit | Attending: Oncology

## 2024-04-09 ENCOUNTER — Encounter (HOSPITAL_COMMUNITY): Payer: Self-pay

## 2024-04-09 ENCOUNTER — Other Ambulatory Visit: Payer: Self-pay

## 2024-04-09 ENCOUNTER — Ambulatory Visit (HOSPITAL_COMMUNITY)
Admission: RE | Admit: 2024-04-09 | Discharge: 2024-04-09 | Disposition: A | Discharge status: Home / Self Care | Source: Ambulatory Visit | Attending: Oncology | Admitting: Oncology

## 2024-04-09 DIAGNOSIS — Z79899 Other long term (current) drug therapy: Secondary | ICD-10-CM | POA: Diagnosis not present

## 2024-04-09 DIAGNOSIS — I252 Old myocardial infarction: Secondary | ICD-10-CM | POA: Diagnosis not present

## 2024-04-09 DIAGNOSIS — I2510 Atherosclerotic heart disease of native coronary artery without angina pectoris: Secondary | ICD-10-CM | POA: Insufficient documentation

## 2024-04-09 DIAGNOSIS — C090 Malignant neoplasm of tonsillar fossa: Secondary | ICD-10-CM | POA: Diagnosis not present

## 2024-04-09 DIAGNOSIS — E669 Obesity, unspecified: Secondary | ICD-10-CM | POA: Insufficient documentation

## 2024-04-09 DIAGNOSIS — I509 Heart failure, unspecified: Secondary | ICD-10-CM | POA: Insufficient documentation

## 2024-04-09 DIAGNOSIS — I130 Hypertensive heart and chronic kidney disease with heart failure and stage 1 through stage 4 chronic kidney disease, or unspecified chronic kidney disease: Secondary | ICD-10-CM | POA: Diagnosis not present

## 2024-04-09 DIAGNOSIS — I701 Atherosclerosis of renal artery: Secondary | ICD-10-CM | POA: Diagnosis not present

## 2024-04-09 DIAGNOSIS — N189 Chronic kidney disease, unspecified: Secondary | ICD-10-CM | POA: Insufficient documentation

## 2024-04-09 DIAGNOSIS — C099 Malignant neoplasm of tonsil, unspecified: Secondary | ICD-10-CM | POA: Diagnosis present

## 2024-04-09 DIAGNOSIS — Z7984 Long term (current) use of oral hypoglycemic drugs: Secondary | ICD-10-CM | POA: Diagnosis not present

## 2024-04-09 DIAGNOSIS — Z6826 Body mass index (BMI) 26.0-26.9, adult: Secondary | ICD-10-CM | POA: Diagnosis not present

## 2024-04-09 DIAGNOSIS — Z953 Presence of xenogenic heart valve: Secondary | ICD-10-CM | POA: Diagnosis not present

## 2024-04-09 DIAGNOSIS — Z95810 Presence of automatic (implantable) cardiac defibrillator: Secondary | ICD-10-CM | POA: Insufficient documentation

## 2024-04-09 DIAGNOSIS — E1122 Type 2 diabetes mellitus with diabetic chronic kidney disease: Secondary | ICD-10-CM | POA: Insufficient documentation

## 2024-04-09 DIAGNOSIS — Z951 Presence of aortocoronary bypass graft: Secondary | ICD-10-CM | POA: Diagnosis not present

## 2024-04-09 DIAGNOSIS — E039 Hypothyroidism, unspecified: Secondary | ICD-10-CM | POA: Diagnosis not present

## 2024-04-09 DIAGNOSIS — E785 Hyperlipidemia, unspecified: Secondary | ICD-10-CM | POA: Insufficient documentation

## 2024-04-09 DIAGNOSIS — Z950 Presence of cardiac pacemaker: Secondary | ICD-10-CM | POA: Insufficient documentation

## 2024-04-09 HISTORY — PX: IR IMAGING GUIDED PORT INSERTION: IMG5740

## 2024-04-09 HISTORY — PX: IR GASTROSTOMY TUBE MOD SED: IMG625

## 2024-04-09 LAB — BASIC METABOLIC PANEL WITH GFR
Anion gap: 13 (ref 5–15)
BUN: 33 mg/dL — ABNORMAL HIGH (ref 8–23)
CO2: 23 mmol/L (ref 22–32)
Calcium: 9.4 mg/dL (ref 8.9–10.3)
Chloride: 102 mmol/L (ref 98–111)
Creatinine, Ser: 1.2 mg/dL (ref 0.61–1.24)
GFR, Estimated: >60 mL/min (ref >60)
Glucose, Bld: 164 mg/dL — ABNORMAL HIGH (ref 70–99)
Potassium: 4.2 mmol/L (ref 3.5–5.1)
Sodium: 138 mmol/L (ref 135–145)

## 2024-04-09 LAB — CBC WITH DIFFERENTIAL/PLATELET
Abs Immature Granulocytes: 0.02 K/uL (ref 0.00–0.07)
Basophils Absolute: 0 K/uL (ref 0.0–0.1)
Basophils Relative: 1 %
Eosinophils Absolute: 0.2 K/uL (ref 0.0–0.5)
Eosinophils Relative: 3 %
HCT: 49.6 % (ref 39.0–52.0)
Hemoglobin: 16.2 g/dL (ref 13.0–17.0)
Immature Granulocytes: 0 %
Lymphocytes Relative: 13 %
Lymphs Abs: 0.9 K/uL (ref 0.7–4.0)
MCH: 30.5 pg (ref 26.0–34.0)
MCHC: 32.7 g/dL (ref 30.0–36.0)
MCV: 93.4 fL (ref 80.0–100.0)
Monocytes Absolute: 0.5 K/uL (ref 0.1–1.0)
Monocytes Relative: 7 %
Neutro Abs: 5.6 K/uL (ref 1.7–7.7)
Neutrophils Relative %: 76 %
Platelets: 124 K/uL — ABNORMAL LOW (ref 150–400)
RBC: 5.31 MIL/uL (ref 4.22–5.81)
RDW: 12.9 % (ref 11.5–15.5)
WBC: 7.3 K/uL (ref 4.0–10.5)
nRBC: 0 % (ref 0.0–0.2)

## 2024-04-09 LAB — GLUCOSE, CAPILLARY: Glucose-Capillary: 191 mg/dL — ABNORMAL HIGH (ref 70–99)

## 2024-04-09 LAB — PROTIME-INR
INR: 1 (ref 0.8–1.2)
Prothrombin Time: 14.2 s (ref 11.4–15.2)

## 2024-04-09 MED ORDER — GLUCAGON HCL (RDNA) 1 MG IJ SOLR
INTRAMUSCULAR | Status: AC | PRN
  Administered 2024-04-09: .5 mg via INTRAVENOUS

## 2024-04-09 MED ORDER — MIDAZOLAM HCL 2 MG/2ML IJ SOLN
INTRAMUSCULAR | Status: AC | PRN
  Administered 2024-04-09: 1 mg via INTRAVENOUS

## 2024-04-09 MED ORDER — HYDROCODONE-ACETAMINOPHEN 5-325 MG PO TABS: 1.0000 | ORAL_TABLET | ORAL | Status: DC | PRN

## 2024-04-09 MED ORDER — IOHEXOL 300 MG/ML  SOLN
50.0000 mL | Freq: Once | INTRAMUSCULAR | Status: AC | PRN
  Administered 2024-04-09: 20 mL

## 2024-04-09 MED ORDER — LIDOCAINE VISCOUS HCL 2 % MT SOLN
OROMUCOSAL | Status: AC
Start: 2024-04-09 — End: 2024-04-09
  Filled 2024-04-09: qty 15

## 2024-04-09 MED ORDER — FENTANYL CITRATE (PF) 100 MCG/2ML IJ SOLN
INTRAMUSCULAR | Status: AC | PRN
  Administered 2024-04-09: 50 ug via INTRAVENOUS

## 2024-04-09 MED ORDER — LIDOCAINE-EPINEPHRINE 1 %-1:100000 IJ SOLN
INTRAMUSCULAR | Status: AC
Start: 2024-04-09 — End: 2024-04-09
  Filled 2024-04-09: qty 1

## 2024-04-09 MED ORDER — CEFAZOLIN SODIUM-DEXTROSE 2-4 GM/100ML-% IV SOLN
2.0000 g | INTRAVENOUS | Status: AC
  Administered 2024-04-09: 2 g via INTRAVENOUS
  Filled 2024-04-09: qty 100

## 2024-04-09 MED ORDER — LIDOCAINE HCL 1 % IJ SOLN
20.0000 mL | Freq: Once | INTRAMUSCULAR | Status: AC
  Administered 2024-04-09: 20 mL via INTRADERMAL

## 2024-04-09 MED ORDER — LIDOCAINE HCL 1 % IJ SOLN
INTRAMUSCULAR | Status: AC
  Filled 2024-04-09: qty 20

## 2024-04-09 MED ORDER — LIDOCAINE-EPINEPHRINE 1 %-1:100000 IJ SOLN: 20.0000 mL | Freq: Once | INTRAMUSCULAR | Status: DC

## 2024-04-09 MED ORDER — FENTANYL CITRATE (PF) 100 MCG/2ML IJ SOLN
INTRAMUSCULAR | Status: AC
Start: 2024-04-09 — End: 2024-04-09
  Filled 2024-04-09: qty 2

## 2024-04-09 MED ORDER — SODIUM CHLORIDE 0.9 % IV SOLN: INTRAVENOUS | Status: DC

## 2024-04-09 MED ORDER — MIDAZOLAM HCL 2 MG/2ML IJ SOLN
INTRAMUSCULAR | Status: AC
  Filled 2024-04-09: qty 2

## 2024-04-09 MED ORDER — HEPARIN SOD (PORK) LOCK FLUSH 100 UNIT/ML IV SOLN: 500.0000 [IU] | Freq: Once | INTRAVENOUS | Status: DC

## 2024-04-09 MED ORDER — GLUCAGON HCL RDNA (DIAGNOSTIC) 1 MG IJ SOLR
INTRAMUSCULAR | Status: AC
  Filled 2024-04-09: qty 1

## 2024-04-09 NOTE — Sedation Documentation (Signed)
Gastrostomy tube placement procedure begun.

## 2024-04-09 NOTE — Progress Notes (Signed)
 1024 Having run of Afib, to PVC's denies having discomfort or chest pain. Skin cool clammy.

## 2024-04-09 NOTE — Progress Notes (Signed)
 9044 Call to [facility] by Luke Rosalva PEAK again to make aware of chest pain and PVC at 0950. Will come to evaluate.

## 2024-04-09 NOTE — Procedures (Signed)
 Vascular and Interventional Radiology Procedure Note  Patient: Christian Bowman DOB: [birth year] Medical Record Number: [id number] Note Date/Time: 04/09/24 12:01 PM   Performing Physician: Thom Hall, MD Assistant(s): None  Diagnosis: Head and Neck cancer  Procedure:  PORT PLACEMENT PERCUTANEOUS GASTROSTOMY TUBE PLACEMENT  Anesthesia: Conscious Sedation Complications: None Estimated Blood Loss: Minimal  Findings:  Successful right-sided port placement, with the tip of the catheter in the proximal right atrium.  Successful placement of a 32F gastrostomy tube under fluoroscopy.  Plan: Port catheter ready for use.  G-tube to gravity drainage bag x 24 hrs Liquid diet x 24 hrs OK to cap G-tube (during 24 hr period listed above) for 2 hours post liquid meal. OK for meds per tube immediately post procedure. OK to begin TFs via G-tube use in 4 hrs. Taper from trickle to goal as tolerated.   Pt is to return to [facility] for routine G-tube exchange in 6 months.  See detailed procedure note with images in PACS. The patient tolerated the procedure well without incident or complication and was returned to Recovery in stable condition.    Thom Hall, MD [facility] Radiology   Pager. [phone] Clinic. [phone]

## 2024-04-09 NOTE — Sedation Documentation (Signed)
 Port a Cath placement procedure complete. Patient being prepped for Gastrostomy tube placement.

## 2024-04-09 NOTE — Progress Notes (Signed)
 9044 Denies having pain.

## 2024-04-09 NOTE — Discharge Instructions (Addendum)
 Please call Interventional Radiology clinic [phone] with any questions or concerns.  You may remove your dressing and shower tomorrow.  DO NOT use EMLA  cream on your port site for 2 weeks as this cream will remove surgical glue on your incision.  Implanted Port Insertion, Care After  This sheet gives you information about how to care for yourself after your procedure. Your health care provider may also give you more specific instructions. If you have problems or questions, contact your health care provider. What can I expect after the procedure? After the procedure, it is common to have: Discomfort at the port insertion site. Bruising on the skin over the port. This should improve over 3-4 days. Follow these instructions at home: [facility] care After your port is placed, you will get a manufacturer's information card. The card has information about your port. Keep this card with you at all times. Take care of the port as told by your health care provider. Ask your health care provider if you or a family member can get training for taking care of the port at home. A home health care nurse may also take care of the port. Make sure to remember what type of port you have. Incision care Follow instructions from your health care provider about how to take care of your port insertion site. Make sure you: Wash your hands with soap and water before and after you change your bandage (dressing). If soap and water are not available, use hand sanitizer. Change your dressing as told by your health care provider. Leave stitches (sutures), skin glue, or adhesive strips in place. These skin closures may need to stay in place for 2 weeks or longer. If adhesive strip edges start to loosen and curl up, you may trim the loose edges. Do not remove adhesive strips completely unless your health care provider tells you to do that. Check your port insertion site every day for signs of infection. Check for: Redness,  swelling, or pain. Fluid or blood. Warmth. Pus or a bad smell.        Activity Return to your normal activities as told by your health care provider. Ask your health care provider what activities are safe for you. Do not lift anything that is heavier than 10 lb (4.5 kg), or the limit that you are told, until your health care provider says that it is safe. General instructions Take over-the-counter and prescription medicines only as told by your health care provider. Do not take baths, swim, or use a hot tub until your health care provider approves. Ask your health care provider if you may take showers. You may only be allowed to take sponge baths. Do not drive for 24 hours if you were given a sedative during your procedure. Wear a medical alert bracelet in case of an emergency. This will tell any health care providers that you have a port. Keep all follow-up visits as told by your health care provider. This is important. Contact a health care provider if: You cannot flush your port with saline as directed, or you cannot draw blood from the port. You have a fever or chills. You have redness, swelling, or pain around your port insertion site. You have fluid or blood coming from your port insertion site. Your port insertion site feels warm to the touch. You have pus or a bad smell coming from the port insertion site. Get help right away if: You have chest pain or shortness of breath. You have bleeding  from your port that you cannot control. Summary Take care of the port as told by your health care provider. Keep the manufacturer's information card with you at all times. Change your dressing as told by your health care provider. Contact a health care provider if you have a fever or chills or if you have redness, swelling, or pain around your port insertion site. Keep all follow-up visits as told by your health care provider. This information is not intended to replace advice given to you  by your health care provider. Make sure you discuss any questions you have with your health care provider. Document Revised: 04/16/2018 Document Reviewed: 04/16/2018 Elsevier Patient Education  2021 [employer]   Moderate Conscious Sedation, Adult, Care After  This sheet gives you information about how to care for yourself after your procedure. Your health care provider may also give you more specific instructions. If you have problems or questions, contact your health care provider. What can I expect after the procedure? After the procedure, it is common to have: Sleepiness for several hours. Impaired judgment for several hours. Difficulty with balance. Vomiting if you eat too soon. Follow these instructions at home: For the time period you were told by your health care provider: Rest. Do not participate in activities where you could fall or become injured. Do not drive or use machinery. Do not drink alcohol. Do not take sleeping pills or medicines that cause drowsiness. Do not make important decisions or sign legal documents. Do not take care of children on your own.        Eating and drinking Follow the diet recommended by your health care provider. Drink enough fluid to keep your urine pale yellow. If you vomit: Drink water, juice, or soup when you can drink without vomiting. Make sure you have little or no nausea before eating solid foods.    General instructions Take over-the-counter and prescription medicines only as told by your health care provider. Have a responsible adult stay with you for the time you are told. It is important to have someone help care for you until you are awake and alert. Do not smoke. Keep all follow-up visits as told by your health care provider. This is important. Contact a health care provider if: You are still sleepy or having trouble with balance after 24 hours. You feel light-headed. You keep feeling nauseous or you keep  vomiting. You develop a rash. You have a fever. You have redness or swelling around the IV site. Get help right away if: You have trouble breathing. You have new-onset confusion at home. Summary After the procedure, it is common to feel sleepy, have impaired judgment, or feel nauseous if you eat too soon. Rest after you get home. Know the things you should not do after the procedure. Follow the diet recommended by your health care provider and drink enough fluid to keep your urine pale yellow. Get help right away if you have trouble breathing or new-onset confusion at home. This information is not intended to replace advice given to you by your health care provider. Make sure you discuss any questions you have with your health care provider. Document Revised: 01/16/2020 Document Reviewed: 08/14/2019 Elsevier Patient Education  2021 [employer]    Gastrostomy Tube Home Guide, Adult A gastrostomy tube, or G-tube, is a tube that is inserted through the abdomen into the stomach. The tube is used to give feedings and medicines when a person cannot eat and drink enough on his  or her own or take medicines by mouth. How to care for the insertion site Washing hands with soap and water.   Supplies needed: Saline solution or clean, warm water and soap. Saline solution is made of salt and water. Cotton swab or gauze. Pre-cut gauze bandage (dressing) and tape, if needed. Instructions Follow these steps daily to clean the insertion site: Wash your hands with soap and water for at least 20 seconds. Remove the dressing (if there is one) that is between the person's skin and the tube. Check the area where the tube enters the skin. Check daily for problems such as: Redness, rash, or irritation. Swelling. Pus-like drainage. Extra skin growth. Moisten the cotton swab or gauze with the saline solution or with a soap-and-water mixture. Gently clean around the insertion site. Remove any drainage or  crusted material. When the G-tube is first put in, a normal saline solution or water can be used to clean the skin. After the skin around the tube has healed, mild soap and water may be used. Apply a dressing (if there should be one) between the person's skin and the tube. How to flush a G-tube Flush the G-tube regularly to keep it from clogging. Flush it before and after feedings and as often as told by the health care provider. Supplies needed: Purified or germ-free (sterile) water, warmed. Container with lid for boiling water, if needed. 60 cc G-tube syringe. Instructions Before you begin, decide whether to use sterile water or purified drinking water. Use only sterile water if: The person has a weak disease-fighting (immune) system. The person has trouble fighting off infections (is immunocompromised). You are unsure about the amount of chemical contaminants in purified or drinking water. Use purified drinking water in all other cases. To purify drinking water by boiling: Boil water for at least 1 minute. Keep lid over water while it boils. Let water cool to room temperature before using. Follow these steps to flush the G-tube: Wash your hands with soap and water for at least 20 seconds. Bring out (draw up) 30 mL of warm water in a syringe. Connect the syringe to the tube. Slowly and gently push the water into the tube. General tips If the tube comes out: Cover the opening with a clean dressing and tape. Get help right away. If there is skin or scar tissue growing where the tube enters the skin: Keep the area clean and dry. Secure the tube with tape so that the tube does not move around too much. If the tube gets clogged: Slowly push warm water into the tube with a large syringe. Do not force the fluid into the tube or push an object into the tube. Get help right away if you cannot unclog the tube. Follow these instructions at home: Feedings Give feedings at room  temperature. If feedings are continuous: Do not put more than 4 hours' worth of feedings in the feeding bag. Stop the feedings when you need to give medicine or flush the tube. Be sure to restart the feedings. Make sure the person's head is above his or her stomach (upright position). This will prevent choking and discomfort. Make sure the person is in the right position during and after feedings. During feedings, have the person in the upright position. After a non-continuous feeding (bolus feeding), have the person stay in the upright position for 1 hour. Cover and place unused feedings in the refrigerator. Replace feeding bags and syringes as told. Good hygiene Make sure the person  takes good care of his or her mouth and teeth (oral hygiene), such as by brushing his or her teeth. Keep the area where the tube enters the skin clean and dry. General instructions Do not pull or put tension on the tube. Before you remove the tube cap or disconnect a syringe, close the tube by using a clamp (clamping) or bending (kinking) the tube. Measure the length of the G-tube every day from the insertion site to the end of the tube. If the person's G-tube has a balloon, check the fluid in the balloon every week. Check the manufacturer's specifications to find the amount of fluid that should be in the balloon. Remove excess air from the G-tube as told. This is called venting. Do not push feedings, medicines, or flushes fast. Use the feeding tube equipment, such as syringes and connectors, only as told by your health care provider. Contact a health care provider if: The person with the tube has constipation or a fever. A large amount of fluid or mucus-like liquid is leaking from the tube. Skin or scar tissue appears to be growing where the tube enters the skin. The length of tube from the insertion site to the G-tube gets longer. Get help right away if: The person with the tube has any of these  problems: Severe pain, tenderness, or bloating in the abdomen. Nausea or vomiting. Trouble breathing or shortness of breath. Any of these problems happen in the area where the tube enters the skin: Redness, irritation, swelling, or soreness. Pus-like discharge. A bad smell. The tube is clogged and cannot be flushed. The tube comes out. The tube will need to be put back in within 4 hours. Summary A gastrostomy tube, or G-tube, is a tube that is inserted through the abdomen into the stomach. The tube is used to give feedings and medicines when a person cannot eat and drink enough on his or her own or cannot take medicine by mouth. Check and clean the insertion site daily as told by the person's health care provider. Flush the G-tube regularly to keep it from clogging. Flush it before and after feedings and as often as told. Keep the area where the tube enters the skin clean and dry. This information is not intended to replace advice given to you by your health care provider. Make sure you discuss any questions you have with your health care provider. Document Revised: 09/08/2020 Document Reviewed: 02/05/2020 Elsevier Patient Education  2023 [employer].

## 2024-04-09 NOTE — Progress Notes (Signed)
 [id number] Page to Franky PA to make aware of Mr. Risser having chest pain on the way to his procedure around 0804, took NTG at 0804, pain stopped at 0805.

## 2024-04-10 ENCOUNTER — Inpatient Hospital Stay (HOSPITAL_BASED_OUTPATIENT_CLINIC_OR_DEPARTMENT_OTHER): Admitting: Oncology

## 2024-04-10 ENCOUNTER — Ambulatory Visit
Admission: RE | Admit: 2024-04-10 | Discharge: 2024-04-10 | Disposition: A | Discharge status: Home / Self Care | Source: Ambulatory Visit | Attending: Radiation Oncology | Admitting: Radiation Oncology

## 2024-04-10 ENCOUNTER — Inpatient Hospital Stay

## 2024-04-10 ENCOUNTER — Inpatient Hospital Stay: Admitting: Nutrition

## 2024-04-10 ENCOUNTER — Encounter: Payer: Self-pay | Admitting: Oncology

## 2024-04-10 ENCOUNTER — Other Ambulatory Visit: Payer: Self-pay

## 2024-04-10 VITALS — BP 113/65 | HR 87 | Temp 98.2°F | Resp 19 | Wt 190.7 lb

## 2024-04-10 VITALS — BP 98/56 | HR 60 | Temp 98.6°F | Resp 18

## 2024-04-10 DIAGNOSIS — Z510 Encounter for antineoplastic radiation therapy: Secondary | ICD-10-CM | POA: Diagnosis not present

## 2024-04-10 DIAGNOSIS — D696 Thrombocytopenia, unspecified: Secondary | ICD-10-CM

## 2024-04-10 DIAGNOSIS — Z5111 Encounter for antineoplastic chemotherapy: Secondary | ICD-10-CM | POA: Insufficient documentation

## 2024-04-10 DIAGNOSIS — Z95828 Presence of other vascular implants and grafts: Secondary | ICD-10-CM | POA: Insufficient documentation

## 2024-04-10 DIAGNOSIS — C090 Malignant neoplasm of tonsillar fossa: Secondary | ICD-10-CM | POA: Insufficient documentation

## 2024-04-10 DIAGNOSIS — N1832 Chronic kidney disease, stage 3b: Secondary | ICD-10-CM | POA: Diagnosis not present

## 2024-04-10 DIAGNOSIS — Z79899 Other long term (current) drug therapy: Secondary | ICD-10-CM | POA: Insufficient documentation

## 2024-04-10 LAB — RAD ONC ARIA SESSION SUMMARY
Course Elapsed Days: 0
Plan Fractions Treated to Date: 1
Plan Prescribed Dose Per Fraction: 2 Gy
Plan Total Fractions Prescribed: 35
Plan Total Prescribed Dose: 70 Gy
Reference Point Dosage Given to Date: 2 Gy
Reference Point Session Dosage Given: 2 Gy
Session Number: 1

## 2024-04-10 MED ORDER — FAMOTIDINE IN NACL 20-0.9 MG/50ML-% IV SOLN
20.0000 mg | Freq: Once | INTRAVENOUS | Status: AC
  Administered 2024-04-10: 20 mg via INTRAVENOUS
  Filled 2024-04-10: qty 50

## 2024-04-10 MED ORDER — SODIUM CHLORIDE 0.9% FLUSH
10.0000 mL | INTRAVENOUS | Status: DC | PRN
  Administered 2024-04-10: 10 mL

## 2024-04-10 MED ORDER — SODIUM CHLORIDE 0.9 % IV SOLN
45.0000 mg/m2 | Freq: Once | INTRAVENOUS | Status: AC
  Administered 2024-04-10: 96 mg via INTRAVENOUS
  Filled 2024-04-10: qty 16

## 2024-04-10 MED ORDER — DEXAMETHASONE SODIUM PHOSPHATE 10 MG/ML IJ SOLN
10.0000 mg | Freq: Once | INTRAMUSCULAR | Status: AC
  Administered 2024-04-10: 10 mg via INTRAVENOUS
  Filled 2024-04-10: qty 1

## 2024-04-10 MED ORDER — SODIUM CHLORIDE 0.9 % IV SOLN
179.6000 mg | Freq: Once | INTRAVENOUS | Status: AC
  Administered 2024-04-10: 180 mg via INTRAVENOUS
  Filled 2024-04-10: qty 18

## 2024-04-10 MED ORDER — SODIUM CHLORIDE 0.9 % IV SOLN
INTRAVENOUS | Status: DC
Start: 2024-04-10 — End: 2024-04-10

## 2024-04-10 MED ORDER — DIPHENHYDRAMINE HCL 50 MG/ML IJ SOLN
25.0000 mg | Freq: Once | INTRAMUSCULAR | Status: AC
  Administered 2024-04-10: 25 mg via INTRAVENOUS
  Filled 2024-04-10: qty 1

## 2024-04-10 MED ORDER — HEPARIN SOD (PORK) LOCK FLUSH 100 UNIT/ML IV SOLN
500.0000 [IU] | Freq: Once | INTRAVENOUS | Status: AC | PRN
  Administered 2024-04-10: 500 [IU]

## 2024-04-10 MED ORDER — PALONOSETRON HCL INJECTION 0.25 MG/5ML
0.2500 mg | Freq: Once | INTRAVENOUS | Status: AC
  Administered 2024-04-10: 0.25 mg via INTRAVENOUS
  Filled 2024-04-10: qty 5

## 2024-04-10 MED ORDER — SODIUM CHLORIDE 0.9% FLUSH
10.0000 mL | Freq: Once | INTRAVENOUS | Status: AC
  Administered 2024-04-10: 10 mL

## 2024-04-10 NOTE — Progress Notes (Signed)
 [facility]  ONCOLOGY CLINIC PROGRESS NOTE   Patient Care Team: Croitoru, Jerel, MD as PCP - General (Cardiology) Inocencio Soyla Lunger, MD as PCP - Electrophysiology (Cardiology) Rolan Ezra RAMAN, MD as PCP - Cardiology (Cardiology) Skeet Juliene SAUNDERS, DO as Consulting Physician (Neurology) Okey Burns, MD as Consulting Physician (Otolaryngology) Izell Domino, MD as Consulting Physician (Radiation Oncology) Autumn Millman, MD as Consulting Physician (Oncology) Malmfelt, Delon CROME, RN as Oncology Nurse Navigator  PATIENT NAME: Christian Bowman   MR#: [id number] DOB: Jun 01, 1946  Date of visit: 04/10/2024   ASSESSMENT & PLAN:   Christian Bowman is a 78 y.o. 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   Primary cancer of tonsillar fossa (HCC) Please review oncology history for additional details and timeline of events.    cT3, cN0, cM0, p16 positive disease, stage II.  Patient had consultation with Dr. Floretta, otolaryngology at [facility] on 03/19/2024.  Given his cardiac history, he was felt to be a high risk candidate for surgery and hence chemoradiation was recommended.  Given his CKD, he is not a candidate for cisplatin.  Hence plan is to proceed with weekly carboplatin  and paclitaxel  during the course of radiation.  Monitoring of kidney function and blood counts is essential to adjust chemotherapy dosing and manage potential side effects such as nausea, vomiting, and fatigue.   If further renal dysfunction is noted, chemotherapy will be adjusted or skipped.   Anti-nausea medications are provided to manage these effects.  -He is scheduled to begin radiation treatment  from today.  Labs from 04/09/2024 reviewed.  No dose-limiting toxicities.  Creatinine slightly better at 1.2.  Will proceed with cycle 1 of carboplatin  and paclitaxel  today.  -He has had Port-A-Cath and feeding tube placement.  RTC in 1 week for labs, office visit and continuation of chemotherapy.  Thrombocytopenia (HCC) Chronic low platelet count since 2018, fluctuating but not at a dangerous level. Current count is slightly below normal but above the threshold for chemotherapy. Monitoring is necessary to ensure it remains above 50,000 to continue chemotherapy.  Platelet count stable at 124,000 today. - Monitor platelet count regularly during chemotherapy. - Hold chemotherapy if platelet count drops below 50,000.  CKD (chronic kidney disease) stage 3, GFR 30-59 ml/min (HCC) Chronic kidney disease with altered kidney function. Cisplatin is avoided due to nephrotoxicity risk. Carboplatin  is chosen as it poses less risk to kidney function. Regular monitoring of kidney function is necessary to adjust chemotherapy dosing and prevent further kidney damage. -Creatinine level improved to 1.2 from 1.6-1.8 - Monitor kidney function regularly during chemotherapy. - Adjust chemotherapy dosing based on kidney function. - Avoid cisplatin due to nephrotoxicity risk.     I reviewed lab results and outside records for this visit and discussed relevant results with the patient. Diagnosis, plan of care and treatment options were also discussed in detail with the patient. Opportunity provided to ask questions and answers provided to his apparent satisfaction. Provided instructions to call our clinic with any problems, questions or concerns prior to return visit. I recommended to continue follow-up with PCP and sub-specialists. He verbalized understanding and agreed with the plan.   NCCN guidelines have been consulted in the planning of this patient's  care.  I spent a total of 30 minutes during this encounter  with the patient including review of chart and various tests results, discussions about plan of care and coordination of care plan.   Chinita Patten, MD  04/10/2024 2:01 PM  [facility] - A DEPT [facility] [address] [place] [address] Dept: [phone] Dept Fax: [phone]    CHIEF COMPLAINT/ REASON FOR VISIT:   Squamous cell carcinoma of the right tonsil, cT3, cN0, cM0, p16 positive, stage II disease  Current Treatment: Concurrent chemoradiation with weekly carboplatin  and paclitaxel , started from 04/10/2024.  INTERVAL HISTORY:    Discussed the use of AI scribe software for clinical note transcription with the patient, who gave verbal consent to proceed.  History of Present Illness Christian Bowman is a 78 year old male who presents for initiation of radiation therapy and follow-up on recent blood work.  He has not yet begun the treatment but is prepared to start.  Recent blood work indicates an improvement in kidney function, with a creatinine level of 1.2 mg/dL, down from previous levels of 1.6 to 1.8 mg/dL. His platelet count is stable at 124,000, which is slightly below the normal range.  No issues with eating or swallowing at home. No cough and no use of inhalers at home.     I have reviewed the past medical history, past surgical history, social history and family history with the patient and they are unchanged from previous note.  HISTORY OF PRESENT ILLNESS:   ONCOLOGY HISTORY:   78 y.o. gentleman who presented to the ED on 02/18/24 for an episode of hemoptysis and complains of dysphagia.   Patient reports longstanding history (7-8 years) of a large right neck mass for which he had a workup and was assured that it was benign. Most recently, her presented to the ER on 02/18/2024 due to an episode of hemoptysis when he coughed up a large bright red blood clot. To further investigate his symptoms,  he underwent a CT neck in the ER showing a large tonsillar mass measuring 2.2 x 3.1 x 3.7 cm on the right side with necrotic lymph nodes concerning for malignancy and a 6.5 x 4.0 cm predominantly low attenuation lesion centered at the right level 2 station, concerning for a cystic/necrotic lymph node. Scan also indicated a nonspecific 5 mm cystic-appearing lesion within the vallecula on the right.       Subsequently, the patient saw Dr. Soldatova who preformed a flexible laryngoscopy showing evidence of a large exophytic mass involving right tonsil and soft palate but without any obvious involvement of the base of the tongue.   Biopsy of right tonsillar mass on 02/18/24 revealed: squamous cell carcinoma, p16 positive.    Staging PET scan on 02/28/24 revealing a hypermetabolic mass centered in the right palatine tonsil pharyngeal region extending to the glossal tonsillar sulcus and the soft palate. No additional areas of abnormal uptake at this time to suggest spread of disease. Scan also noted small lung nodule seen previously are not well seen on the current examination but there is significant motion.    CT chest preformed on 02/18/24 showed a new 5 mm nodule within the posterior and medial left lower lobe with follow up recommended. No acute findings within the chest. No explanation for patient's hemoptysis within the chest and mild diffuse bronchial wall thickening is nonspecific, but can be seen in the setting of  bronchitis.    Swallowing issues, if any: Dysphagia with bread. Patient denies odynophagia.    Weight Changes: Weight has remained stable.    Pain status: Patient denies pain.    Other symptoms: right-sided neck swelling, throat clearing    He has a cystic lesion in the mid right forehead which he reports is from a traumatic injury as a child   He also reports that he had a cystic lesion in the left neck which regressed several years ago after appearing at the same time as the right  neck mass.    Patient had consultation with Dr. Floretta, otolaryngology at [facility] on 03/19/2024.  Given his cardiac history, he was felt to be a high risk candidate for surgery and hence chemoradiation was recommended.   Given his CKD, he is not a candidate for cisplatin.  Hence plan is to proceed with weekly carboplatin  and paclitaxel  during the course of radiation.  Started treatments from 04/10/2024.  Oncology History  Primary cancer of tonsillar fossa (HCC)  03/04/2024 Initial Diagnosis   Primary cancer of tonsillar fossa (HCC)   03/04/2024 Cancer Staging   Staging form: Pharynx - HPV-Mediated Oropharynx, AJCC 8th Edition - Clinical stage from 03/04/2024: Stage II (cT3, cN0, cM0, p16+) - Signed by Autumn Millman, MD on 03/21/2024 Stage prefix: Initial diagnosis   04/10/2024 -  Chemotherapy   Patient is on Treatment Plan : HEAD/NECK Carboplatin  + Paclitaxel  + XRT q7d         REVIEW OF SYSTEMS:   Review of Systems - Oncology  All other pertinent systems were reviewed with the patient and are negative.  ALLERGIES: He is allergic to xanax [alprazolam].  MEDICATIONS:  Current Outpatient Medications  Medication Sig Dispense Refill   acetaminophen  (TYLENOL ) 325 MG tablet Take 325 mg by mouth every 6 (six) hours as needed for mild pain (pain score 1-3).     apixaban  (ELIQUIS ) 5 MG TABS tablet Take 1 tablet (5 mg total) by mouth 2 (two) times daily. 60 tablet 6   Blood Glucose Monitoring Suppl (ACCU-CHEK GUIDE ME) w/Device KIT 1 Piece by Does not apply route as directed. 1 kit 0   carvedilol  (COREG ) 12.5 MG tablet TAKE 1 & 1/2 (ONE & ONE-HALF) TABLETS BY MOUTH TWICE DAILY WITH A MEAL 180 tablet 3   Cholecalciferol  (VITAMIN D ) 50 MCG (2000 UT) tablet Take 2,000 Units by mouth daily with breakfast.     dapagliflozin  propanediol (FARXIGA ) 10 MG TABS tablet Take 1 tablet (10 mg total) by mouth daily. 90 tablet 3   dexamethasone  (DECADRON ) 4 MG tablet Take 2 tablets daily for 2 days, start  the day after chemotherapy. Take with food. 30 tablet 1   digoxin  (LANOXIN ) 0.125 MG tablet Take 1/2 (one-half) tablet by mouth once daily 45 tablet 3   eplerenone  (INSPRA ) 50 MG tablet Take 1 tablet (50 mg total) by mouth daily. 90 tablet 3   furosemide  (LASIX ) 20 MG tablet Take 1 tablet (20 mg total) by mouth daily as needed (for a weight over 190 lbs). 30 tablet 11   glipiZIDE  (GLUCOTROL  XL) 2.5 MG 24 hr tablet Take 1 tablet (2.5 mg total) by mouth daily with breakfast. 90 tablet 1   glucose blood (ACCU-CHEK GUIDE TEST) test strip Use to test blood glucose daily as instructed 100 each 2   levothyroxine  (SYNTHROID ) 50 MCG tablet Take 1 tablet (50 mcg total) by mouth daily before breakfast. 90 tablet 1   lidocaine -prilocaine  (EMLA ) cream Apply to affected area  once 30 g 3   neomycin-bacitracin-polymyxin (NEOSPORIN) ointment Apply 1 application  topically daily as needed for wound care.     nitroGLYCERIN  (NITROSTAT ) 0.4 MG SL tablet DISSOLVE 1 TABLET UNDER THE TONGUE EVERY 5 MINUTES AS NEEDED FOR CHEST PAIN, DO NOT EXCEED 3 DOSES IN 15 MINUTES 25 tablet 1   ondansetron  (ZOFRAN ) 8 MG tablet Take 1 tablet (8 mg total) by mouth every 8 (eight) hours as needed for nausea or vomiting. Start on the third day after chemotherapy. 30 tablet 1   polyethylene glycol (MIRALAX ) 17 g packet Take 17 g by mouth daily as needed for mild constipation or moderate constipation.     prochlorperazine  (COMPAZINE ) 10 MG tablet Take 1 tablet (10 mg total) by mouth every 6 (six) hours as needed for nausea or vomiting. 30 tablet 1   rosuvastatin  (CRESTOR ) 10 MG tablet Take 1 tablet (10 mg total) by mouth daily. 90 tablet 3   sacubitril -valsartan  (ENTRESTO ) 97-103 MG Take 1 tablet by mouth twice daily 180 tablet 3   sodium zirconium cyclosilicate  (LOKELMA ) 10 g PACK packet DISSOLVE 1 PACKET IN WATER & DRINK ONCE DAILY 90 each 3   Tetrahydrozoline HCl (VISINE OP) Place 1 drop into both eyes daily as needed (irritation).     No  current facility-administered medications for this visit.   Facility-Administered Medications Ordered in Other Visits  Medication Dose Route Frequency Provider Last Rate Last Admin   0.9 %  sodium chloride  infusion   Intravenous Continuous Diangelo Radel, MD 10 mL/hr at 04/10/24 1218 New Bag at 04/10/24 1218   CARBOplatin  (PARAPLATIN ) 180 mg in sodium chloride  0.9 % 100 mL chemo infusion  180 mg Intravenous Once Ellianne Gowen, MD       heparin  lock flush 100 unit/mL  500 Units Intracatheter Once PRN Evelynn Hench, MD       PACLitaxel  (TAXOL ) 96 mg in sodium chloride  0.9 % 250 mL chemo infusion (</= 80mg /m2)  45 mg/m2 (Treatment Plan Recorded) Intravenous Once Abcde Oneil, MD 266 mL/hr at 04/10/24 1355 96 mg at 04/10/24 1355   sodium chloride  flush (NS) 0.9 % injection 10 mL  10 mL Intracatheter PRN Caine Barfield, MD         VITALS:   Blood pressure 113/65, pulse 87, temperature 98.2 F (36.8 C), temperature source Temporal, resp. rate 19, weight 190 lb 11.2 oz (86.5 kg), SpO2 99%.  Wt Readings from Last 3 Encounters:  04/10/24 190 lb 11.2 oz (86.5 kg)  04/09/24 193 lb 2 oz (87.6 kg)  03/26/24 193 lb 3.2 oz (87.6 kg)    Body mass index is 25.86 kg/m.      PHYSICAL EXAM:   Physical Exam Constitutional:      General: He is not in acute distress.    Appearance: Normal appearance.  HENT:     Head: Normocephalic and atraumatic.     Mouth/Throat:     Comments:  ~ 3-4 cm exophytic lesion involving the entire right tonsillar fossa, with palate involvement, reaching the uvula but not crossing midline. Eyes:     Conjunctiva/sclera: Conjunctivae normal.  Cardiovascular:     Rate and Rhythm: Normal rate and regular rhythm.  Pulmonary:     Effort: Pulmonary effort is normal. No respiratory distress.  Abdominal:     General: There is no distension.  Lymphadenopathy:     Cervical: Cervical adenopathy (right sided, soft, ~ 6 cm) present.  Neurological:     General: No focal  deficit present.  Mental Status: He is alert and oriented to person, place, and time.  Psychiatric:        Mood and Affect: Mood normal.        Behavior: Behavior normal.       LABORATORY DATA:   I have reviewed the data as listed.  No results found for any visits on 04/10/24.  Results for orders placed or performed during the hospital encounter of 04/09/24 (from the past 72 hours)  CBC with Differential/Platelet     Status: Abnormal   Collection Time: 04/09/24 10:00 AM  Result Value Ref Range   WBC 7.3 4.0 - 10.5 K/uL   RBC 5.31 4.22 - 5.81 MIL/uL   Hemoglobin 16.2 13.0 - 17.0 g/dL   HCT 50.3 60.9 - 47.9 %   MCV 93.4 80.0 - 100.0 fL   MCH 30.5 26.0 - 34.0 pg   MCHC 32.7 30.0 - 36.0 g/dL   RDW 87.0 88.4 - 84.4 %   Platelets 124 (L) 150 - 400 K/uL   nRBC 0.0 0.0 - 0.2 %   Neutrophils Relative % 76 %   Neutro Abs 5.6 1.7 - 7.7 K/uL   Lymphocytes Relative 13 %   Lymphs Abs 0.9 0.7 - 4.0 K/uL   Monocytes Relative 7 %   Monocytes Absolute 0.5 0.1 - 1.0 K/uL   Eosinophils Relative 3 %   Eosinophils Absolute 0.2 0.0 - 0.5 K/uL   Basophils Relative 1 %   Basophils Absolute 0.0 0.0 - 0.1 K/uL   Immature Granulocytes 0 %   Abs Immature Granulocytes 0.02 0.00 - 0.07 K/uL    Comment: Performed at [facility], [address], [place], [place] [address]  Protime-INR     Status: None   Collection Time: 04/09/24 10:00 AM  Result Value Ref Range   Prothrombin Time 14.2 11.4 - 15.2 seconds   INR 1.0 0.8 - 1.2    Comment: (NOTE) INR goal varies based on device and disease states. Performed at [facility], [address] [place], [place] [address]   Basic metabolic panel     Status: Abnormal   Collection Time: 04/09/24 10:00 AM  Result Value Ref Range   Sodium 138 135 - 145 mmol/L   Potassium 4.2 3.5 - 5.1 mmol/L   Chloride 102 98 - 111 mmol/L   CO2 23 22 - 32 mmol/L   Glucose, Bld 164 (H) 70 - 99 mg/dL    Comment: Glucose reference  range applies only to samples taken after fasting for at least 8 hours.   BUN 33 (H) 8 - 23 mg/dL   Creatinine, Ser 8.79 0.61 - 1.24 mg/dL   Calcium  9.4 8.9 - 10.3 mg/dL   GFR, Estimated >39 >39 mL/min    Comment: (NOTE) Calculated using the CKD-EPI Creatinine Equation (2021)    Anion gap 13 5 - 15    Comment: Performed at [facility], [address], [place], [place] [address]     RADIOGRAPHIC STUDIES:  I have personally reviewed the radiological images as listed and agree with the findings in the report.  IR GASTROSTOMY TUBE MOD SED Result Date: 04/09/2024 INDICATION: he will receive chemo/radiation for head and neck cancer EXAM: Procedures: 1. FLUOROSCOPIC NASOGASTRIC TUBE PLACEMENT 2. PERCUTANEOUS GASTROSTOMY FEEDING TUBE PLACEMENT COMPARISON:  PET-CT, 02/28/2024.  CT chest, 02/18/2024. MEDICATIONS: Ancef  2 gm IV; Antibiotics were administered within 1 hour of the procedure. 1 mg glucagon  IV. CONTRAST:  20 mL of Isovue  300  administered into the gastric lumen. ANESTHESIA/SEDATION: Moderate (conscious) sedation was employed during this procedure. A total of Versed  1 mg and Fentanyl  50 mcg was administered intravenously. Moderate Sedation Time: 13 minutes. The patient's level of consciousness and vital signs were monitored continuously by radiology nursing throughout the procedure under my direct supervision. FLUOROSCOPY: Radiation Exposure Index and estimated peak skin dose (PSD); Reference air kerma (RAK), 6 mGy. COMPLICATIONS: None immediate. PROCEDURE: Informed written consent was obtained from the patient and/or patient's representative following explanation of the procedure, risks, benefits and alternatives. A time out was performed prior to the initiation of the procedure. Maximal barrier sterile technique utilized including caps, mask, sterile gowns, sterile gloves, large sterile drape, hand hygiene and sterile prep. The LEFT upper quadrant was sterilely prepped and  draped. An oral gastric catheter was inserted into the stomach under fluoroscopy. The LEFT costal margin and barium opacified transverse colon were identified and avoided. Air was injected into the stomach for insufflation and visualization under fluoroscopy. Under sterile conditions and local anesthesia, 2 T tacks were utilized to pexy the anterior aspect of the stomach against the ventral abdominal wall. Contrast injection confirmed appropriate positioning of each of the T tacks. An incision was made between the T tacks and a 17 gauge trocar needle was utilized to access the stomach. Needle position was confirmed within the stomach with aspiration of air and injection of a small amount of contrast. A stiff guidewire was advanced into the gastric lumen and under intermittent fluoroscopic guidance, the access needle was exchanged for a telescoping peel-away sheath, ultimately allowing placement of a 16 Fr balloon retention gastrostomy tube. The retention balloon was insufflated with a mixture of dilute saline and contrast and pulled taut against the anterior wall of the stomach. The external disc was cinched. Contrast injection confirms positioning within the stomach. Several spot radiographic images were obtained in various obliquities for documentation. The patient tolerated procedure well without immediate post procedural complication. FINDINGS: After successful fluoroscopic guided placement, the gastrostomy tube is appropriately positioned with internal retention balloon against the ventral aspect of the gastric lumen. IMPRESSION: Successful fluoroscopic insertion of a 16 Fr balloon retention gastrostomy tube. The gastrostomy may be used immediately for medication administration and in 4 hrs for the initiation of feeds. RECOMMENDATIONS: The patient will return to Vascular Interventional Radiology (VIR) for routine feeding tube evaluation and exchange in 6 months. Thom Hall, MD [facility]ascular [facility] Interventional  Radiology Specialists [facility] Radiology Electronically Signed   By: Thom Hall M.D.   On: 04/09/2024 16:10   IR IMAGING GUIDED PORT INSERTION Result Date: 04/09/2024 INDICATION: he will receive chemo for head and neck cancer EXAM: IMPLANTED PORT A CATH PLACEMENT WITH ULTRASOUND AND FLUOROSCOPIC GUIDANCE MEDICATIONS: None ANESTHESIA/SEDATION: Moderate (conscious) sedation was employed during this procedure. A total of Versed  1 mg and Fentanyl  50 mcg was administered intravenously. Moderate Sedation Time: 21 minutes. The patient's level of consciousness and vital signs were monitored continuously by radiology nursing throughout the procedure under my direct supervision. FLUOROSCOPY: Radiation Exposure Index and estimated peak skin dose (PSD); Reference air kerma (RAK), 1 mGy. COMPLICATIONS: None immediate. PROCEDURE: The procedure, risks, benefits, and alternatives were explained to the patient. Questions regarding the procedure were encouraged and answered. The patient understands and consents to the procedure. The neck and chest were prepped with chlorhexidine  in a sterile fashion, and a sterile drape was applied covering the operative field. Maximum barrier sterile technique with sterile gowns and gloves were used for the procedure. A  timeout was performed prior to the initiation of the procedure. Local anesthesia was provided with 1% lidocaine  with epinephrine . After creating a small venotomy incision, a micropuncture kit was utilized to access the internal jugular vein under direct, real-time ultrasound guidance. Ultrasound image documentation was performed. The microwire was kinked to measure appropriate catheter length. A subcutaneous port pocket was then created along the upper chest wall utilizing a combination of sharp and blunt dissection. The pocket was irrigated with sterile saline. A single lumen power injectable port was chosen for placement. The 8 Fr catheter was tunneled from the port pocket  site to the venotomy incision. The port was placed in the pocket. The external catheter was trimmed to appropriate length. At the venotomy, an 8 Fr peel-away sheath was placed over a guidewire under fluoroscopic guidance. The catheter was then placed through the sheath and the sheath was removed. Final catheter positioning was confirmed and documented with a fluoroscopic spot radiograph. The port was accessed with a Huber needle, aspirated and flushed with heparinized saline. The port pocket incision was closed with interrupted 3-0 Vicryl suture then Dermabond was applied, including at the venotomy incision. Dressings were placed. The patient tolerated the procedure well without immediate post procedural complication. IMPRESSION: Successful placement of a RIGHT internal jugular approach power injectable Port-A-Cath. The tip of the catheter is positioned within the proximal RIGHT atrium. The catheter is ready for immediate use. Thom Hall, MD Vascular and Interventional Radiology Specialists [facility] Radiology Electronically Signed   By: Thom Hall M.D.   On: 04/09/2024 16:05     CODE STATUS:  Code Status History     Date Active Date Inactive Code Status Order ID Comments User Context   04/09/2024 1259 04/10/2024 0512 Full Code [id number]  Hall Thom, MD HOV   04/09/2024 1259 04/09/2024 1259 Full Code [id number]  Hall Thom, MD HOV   02/18/2024 1202 02/19/2024 2007 Do not attempt resuscitation (DNR) PRE-ARREST INTERVENTIONS DESIRED [id number]  Alba Sharper, MD ED   01/08/2020 1342 01/08/2020 1958 Full Code [id number]  Rolan Ezra RAMAN, MD Inpatient   08/20/2017 1404 08/23/2017 1652 Full Code [id number]  Ethyl Lenis, MD Inpatient   05/11/2017 1020 05/11/2017 1929 Full Code [id number]  Inocencio Soyla Lunger, MD Inpatient   12/15/2016 0851 12/19/2016 1357 Full Code [id number]  Johnson Laymon HERO, PA Inpatient   07/11/2016 1713 08/01/2016 1629 Full Code [id number]  Dusty Sudie DEL, MD Inpatient   06/21/2016 1332  06/21/2016 1935 Full Code [id number]  Wonda Sharper, MD Inpatient    Questions for Most Recent Historical Code Status (Order [id number])     Question Answer   By: Consent: discussion documented in EHR            Orders Placed This Encounter  Procedures   CBC with Differential (Cancer Center Only)    Standing Status:   Future    Expected Date:   05/22/2024    Expiration Date:   05/22/2025   CMP (Cancer Center only)    Standing Status:   Future    Expected Date:   05/22/2024    Expiration Date:   05/22/2025     Future Appointments  Date Time Provider Department Center  04/10/2024  4:30 PM Izell Domino, MD [facility] None  04/11/2024 12:30 PM [facility] LINAC 4 [facility] None  04/14/2024  4:45 PM [facility] LINAC 3 [facility] None  04/14/2024  5:00 PM LINAC-SQUIRE [facility] None  04/15/2024 11:45 AM [facility] LINAC 3 [facility] None  04/16/2024  2:05 PM [facility] LINAC 3  [facility] None  04/17/2024  8:15 AM [facility] FLUSH [facility] None  04/17/2024  8:45 AM Larine Fielding, MD [facility] None  04/17/2024  9:15 AM [facility] INFUSION [facility] None  04/17/2024 11:15 AM Neff, Barbara L, RD [facility] None  04/17/2024  1:30 PM [facility] [id number] [facility] None  04/18/2024  1:45 PM [facility] [id number] [facility] None  04/21/2024  9:45 AM [facility] [id number] [facility] None  04/22/2024  8:10 AM [facility] H[facility] DEVICE REMOTES [facility] H&V  04/22/2024 10:45 AM [facility] [id number] [facility] None  04/23/2024  1:15 PM [facility] [id number] [facility] None  04/24/2024 10:15 AM [facility] FLUSH [facility] None  04/24/2024 10:45 AM [facility] [id number] [facility] None  04/24/2024 11:00 AM Boscia, Heather E, NP [facility] None  04/24/2024 11:45 AM [facility] INFUSION [facility] None  04/24/2024 12:00 PM Neff, Barbara L, RD [facility] None  04/25/2024 10:45 AM [facility] [id number] [facility] None  04/28/2024  7:10 AM CVD HVT DEVICE REMOTES [facility] H&V  04/28/2024 10:45  AM [facility] [id number] [facility] None  04/29/2024 10:45 AM [facility] [id number] [facility] None  04/30/2024 10:45 AM [facility] [id number] [facility] None  05/01/2024 10:15 AM [facility] FLUSH [facility] None  05/01/2024 10:45 AM [facility] [id number] [facility] None  05/01/2024 11:00 AM Boscia, Heather E, NP [facility] None  05/01/2024 11:45 AM [facility] INFUSION [facility] None  05/01/2024 12:00 PM Neff, Barbara L, RD [facility] None  05/02/2024 10:45 AM [facility] [id number] [facility] None  05/05/2024 10:45 AM [facility] [id number] [facility] None  05/06/2024 10:45 AM [facility] [id number] [facility] None  05/07/2024 10:45 AM [facility] [id number] [facility] None  05/08/2024 10:45 AM [facility] [id number] [facility] None  05/09/2024 10:45 AM [facility] [id number] [facility] None  05/12/2024 10:45 AM [facility] [id number] [facility] None  05/13/2024 10:45 AM [facility] [id number] [facility] None  05/14/2024 10:45 AM [facility] [id number] [facility] None  05/15/2024 10:45 AM [facility] [id number] [facility] None  05/16/2024 10:45 AM [facility] [id number] [facility] None  05/19/2024 10:45 AM [facility] [id number] [facility] None  05/20/2024 10:45 AM [facility] [id number] [facility] None  05/21/2024 10:45 AM [facility] [id number] [facility] None  05/22/2024 10:45 AM [facility] [id number] [facility] None  05/23/2024  9:40 AM Croitoru, Mihai, MD [facility] H&V  05/23/2024 10:45 AM [facility] [id number] [facility] None  05/26/2024 10:45 AM [facility] [id number] [facility] None  05/27/2024 10:45 AM [facility] [id number] [facility] None  05/28/2024 10:45 AM [facility] [id number] [facility] None  06/18/2024 10:00 AM Breedlove Blue, Blaire L, PT [facility] None  07/22/2024  8:10 AM CVD HVT DEVICE REMOTES [facility] H&V  09/10/2024  9:30 AM Nida, Ethelle ORN, MD [facility] None      This document was completed utilizing speech recognition software. Grammatical errors, random word insertions,  pronoun errors, and incomplete sentences are an occasional consequence of this system due to software limitations, ambient noise, and hardware issues. Any formal questions or concerns about the content, text or information contained within the body of this dictation should be directly addressed to the provider for clarification.

## 2024-04-10 NOTE — Patient Instructions (Signed)
 [facility] - A DEPT OF [facility]  Discharge Instructions: Thank you for choosing [facility] to provide your oncology and hematology care.   If you have a lab appointment with the Cancer Center, please go directly to the Cancer Center and check in at the registration area.   Wear comfortable clothing and clothing appropriate for easy access to any Portacath or PICC line.   We strive to give you quality time with your provider. You may need to reschedule your appointment if you arrive late (15 or more minutes).  Arriving late affects you and other patients whose appointments are after yours.  Also, if you miss three or more appointments without notifying the office, you may be dismissed from the clinic at the provider's discretion.      For prescription refill requests, have your pharmacy contact our office and allow 72 hours for refills to be completed.    Today you received the following chemotherapy and/or immunotherapy agents : Paclitaxel (Taxol) and Carboplatin    To help prevent nausea and vomiting after your treatment, we encourage you to take your nausea medication as directed.  BELOW ARE SYMPTOMS THAT SHOULD BE REPORTED IMMEDIATELY: *FEVER GREATER THAN 100.4 F (38 C) OR HIGHER *CHILLS OR SWEATING *NAUSEA AND VOMITING THAT IS NOT CONTROLLED WITH YOUR NAUSEA MEDICATION *UNUSUAL SHORTNESS OF BREATH *UNUSUAL BRUISING OR BLEEDING *URINARY PROBLEMS (pain or burning when urinating, or frequent urination) *BOWEL PROBLEMS (unusual diarrhea, constipation, pain near the anus) TENDERNESS IN MOUTH AND THROAT WITH OR WITHOUT PRESENCE OF ULCERS (sore throat, sores in mouth, or a toothache) UNUSUAL RASH, SWELLING OR PAIN  UNUSUAL VAGINAL DISCHARGE OR ITCHING   Items with * indicate a potential emergency and should be followed up as soon as possible or go to the Emergency Department if any problems should occur.  Please show the CHEMOTHERAPY  ALERT CARD or IMMUNOTHERAPY ALERT CARD at check-in to the Emergency Department and triage nurse.  Should you have questions after your visit or need to cancel or reschedule your appointment, please contact [facility] - A DEPT OF [facility]  Dept: [phone]  and follow the prompts.  Office hours are 8:00 a.m. to 4:30 p.m. Monday - Friday. Please note that voicemails left after 4:00 p.m. may not be returned until the following business day.  We are closed weekends and major holidays. You have access to a nurse at all times for urgent questions. Please call the main number to the clinic Dept: [phone] and follow the prompts.   For any non-urgent questions, you may also contact your provider using MyChart. We now offer e-Visits for anyone 55 and older to request care online for non-urgent symptoms. For details visit [email or web address].   Also download the MyChart app! Go to the app store, search "MyChart", open the app, select [place], and log in with your MyChart username and password.  Paclitaxel Injection What is this medication? PACLITAXEL (PAK li TAX el) treats some types of cancer. It works by slowing down the growth of cancer cells. This medicine may be used for other purposes; ask your health care provider or pharmacist if you have questions. COMMON BRAND NAME(S): Onxol, Taxol What should I tell my care team before I take this medication? They need to know if you have any of these conditions: Heart disease Liver disease Low white blood cell levels An unusual or allergic reaction to paclitaxel, other  medications, foods, dyes, or preservatives If you or your partner are pregnant or trying to get pregnant Breast-feeding How should I use this medication? This medication is injected into a vein. It is given by your care team in a hospital or clinic setting. Talk to your care team about the use of this medication in children. While it may be  given to children for selected conditions, precautions do apply. Overdosage: If you think you have taken too much of this medicine contact a poison control center or emergency room at once. NOTE: This medicine is only for you. Do not share this medicine with others. What if I miss a dose? Keep appointments for follow-up doses. It is important not to miss your dose. Call your care team if you are unable to keep an appointment. What may interact with this medication? Do not take this medication with any of the following: Live virus vaccines Other medications may affect the way this medication works. Talk with your care team about all of the medications you take. They may suggest changes to your treatment plan to lower the risk of side effects and to make sure your medications work as intended. This list may not describe all possible interactions. Give your health care provider a list of all the medicines, herbs, non-prescription drugs, or dietary supplements you use. Also tell them if you smoke, drink alcohol, or use illegal drugs. Some items may interact with your medicine. What should I watch for while using this medication? Your condition will be monitored carefully while you are receiving this medication. You may need blood work while taking this medication. This medication may make you feel generally unwell. This is not uncommon as chemotherapy can affect healthy cells as well as cancer cells. Report any side effects. Continue your course of treatment even though you feel ill unless your care team tells you to stop. This medication can cause serious allergic reactions. To reduce the risk, your care team may give you other medications to take before receiving this one. Be sure to follow the directions from your care team. This medication may increase your risk of getting an infection. Call your care team for advice if you get a fever, chills, sore throat, or other symptoms of a cold or flu. Do not  treat yourself. Try to avoid being around people who are sick. This medication may increase your risk to bruise or bleed. Call your care team if you notice any unusual bleeding. Be careful brushing or flossing your teeth or using a toothpick because you may get an infection or bleed more easily. If you have any dental work done, tell your dentist you are receiving this medication. Talk to your care team if you may be pregnant. Serious birth defects can occur if you take this medication during pregnancy. Talk to your care team before breastfeeding. Changes to your treatment plan may be needed. What side effects may I notice from receiving this medication? Side effects that you should report to your care team as soon as possible: Allergic reactions--skin rash, itching, hives, swelling of the face, lips, tongue, or throat Heart rhythm changes--fast or irregular heartbeat, dizziness, feeling faint or lightheaded, chest pain, trouble breathing Increase in blood pressure Infection--fever, chills, cough, sore throat, wounds that don't heal, pain or trouble when passing urine, general feeling of discomfort or being unwell Low blood pressure--dizziness, feeling faint or lightheaded, blurry vision Low red blood cell level--unusual weakness or fatigue, dizziness, headache, trouble breathing Painful swelling, warmth, or  redness of the skin, blisters or sores at the infusion site Pain, tingling, or numbness in the hands or feet Slow heartbeat--dizziness, feeling faint or lightheaded, confusion, trouble breathing, unusual weakness or fatigue Unusual bruising or bleeding Side effects that usually do not require medical attention (report to your care team if they continue or are bothersome): Diarrhea Hair loss Joint pain Loss of appetite Muscle pain Nausea Vomiting This list may not describe all possible side effects. Call your doctor for medical advice about side effects. You may report side effects to FDA  at 1-800-FDA-1088. Where should I keep my medication? This medication is given in a hospital or clinic. It will not be stored at home. NOTE: This sheet is a summary. It may not cover all possible information. If you have questions about this medicine, talk to your doctor, pharmacist, or health care provider.  2024 Elsevier/Gold Standard (2022-02-07 00:00:00)  Carboplatin Injection What is this medication? CARBOPLATIN (KAR boe pla tin) treats some types of cancer. It works by slowing down the growth of cancer cells. This medicine may be used for other purposes; ask your health care provider or pharmacist if you have questions. COMMON BRAND NAME(S): Paraplatin What should I tell my care team before I take this medication? They need to know if you have any of these conditions: Blood disorders Hearing problems Kidney disease Recent or ongoing radiation therapy An unusual or allergic reaction to carboplatin, cisplatin, other medications, foods, dyes, or preservatives Pregnant or trying to get pregnant Breast-feeding How should I use this medication? This medication is injected into a vein. It is given by your care team in a hospital or clinic setting. Talk to your care team about the use of this medication in children. Special care may be needed. Overdosage: If you think you have taken too much of this medicine contact a poison control center or emergency room at once. NOTE: This medicine is only for you. Do not share this medicine with others. What if I miss a dose? Keep appointments for follow-up doses. It is important not to miss your dose. Call your care team if you are unable to keep an appointment. What may interact with this medication? Medications for seizures Some antibiotics, such as amikacin, gentamicin, neomycin, streptomycin, tobramycin Vaccines This list may not describe all possible interactions. Give your health care provider a list of all the medicines, herbs,  non-prescription drugs, or dietary supplements you use. Also tell them if you smoke, drink alcohol, or use illegal drugs. Some items may interact with your medicine. What should I watch for while using this medication? Your condition will be monitored carefully while you are receiving this medication. You may need blood work while taking this medication. This medication may make you feel generally unwell. This is not uncommon, as chemotherapy can affect healthy cells as well as cancer cells. Report any side effects. Continue your course of treatment even though you feel ill unless your care team tells you to stop. In some cases, you may be given additional medications to help with side effects. Follow all directions for their use. This medication may increase your risk of getting an infection. Call your care team for advice if you get a fever, chills, sore throat, or other symptoms of a cold or flu. Do not treat yourself. Try to avoid being around people who are sick. Avoid taking medications that contain aspirin, acetaminophen, ibuprofen, naproxen, or ketoprofen unless instructed by your care team. These medications may hide a fever. Be  careful brushing or flossing your teeth or using a toothpick because you may get an infection or bleed more easily. If you have any dental work done, tell your dentist you are receiving this medication. Talk to your care team if you wish to become pregnant or think you might be pregnant. This medication can cause serious birth defects. Talk to your care team about effective forms of contraception. Do not breast-feed while taking this medication. What side effects may I notice from receiving this medication? Side effects that you should report to your care team as soon as possible: Allergic reactions--skin rash, itching, hives, swelling of the face, lips, tongue, or throat Infection--fever, chills, cough, sore throat, wounds that don't heal, pain or trouble when passing  urine, general feeling of discomfort or being unwell Low red blood cell level--unusual weakness or fatigue, dizziness, headache, trouble breathing Pain, tingling, or numbness in the hands or feet, muscle weakness, change in vision, confusion or trouble speaking, loss of balance or coordination, trouble walking, seizures Unusual bruising or bleeding Side effects that usually do not require medical attention (report to your care team if they continue or are bothersome): Hair loss Nausea Unusual weakness or fatigue Vomiting This list may not describe all possible side effects. Call your doctor for medical advice about side effects. You may report side effects to FDA at 1-800-FDA-1088. Where should I keep my medication? This medication is given in a hospital or clinic. It will not be stored at home. NOTE: This sheet is a summary. It may not cover all possible information. If you have questions about this medicine, talk to your doctor, pharmacist, or health care provider.  2024 Elsevier/Gold Standard (2022-01-10 00:00:00)

## 2024-04-10 NOTE — Assessment & Plan Note (Signed)
 Chronic low platelet count since 2018, fluctuating but not at a dangerous level. Current count is slightly below normal but above the threshold for chemotherapy. Monitoring is necessary to ensure it remains above 50,000 to continue chemotherapy.  Platelet count stable at 124,000 today. - Monitor platelet count regularly during chemotherapy. - Hold chemotherapy if platelet count drops below 50,000.

## 2024-04-10 NOTE — Assessment & Plan Note (Addendum)
 Please review oncology history for additional details and timeline of events.    cT3, cN0, cM0, p16 positive disease, stage II.  Patient had consultation with Dr. Floretta, otolaryngology at [facility] on 03/19/2024.  Given his cardiac history, he was felt to be a high risk candidate for surgery and hence chemoradiation was recommended.  Given his CKD, he is not a candidate for cisplatin.  Hence plan is to proceed with weekly carboplatin  and paclitaxel  during the course of radiation.  Monitoring of kidney function and blood counts is essential to adjust chemotherapy dosing and manage potential side effects such as nausea, vomiting, and fatigue.   If further renal dysfunction is noted, chemotherapy will be adjusted or skipped.   Anti-nausea medications are provided to manage these effects.  -He is scheduled to begin radiation treatment from today.  Labs from 04/09/2024 reviewed.  No dose-limiting toxicities.  Creatinine slightly better at 1.2.  Will proceed with cycle 1 of carboplatin  and paclitaxel  today.  -He has had Port-A-Cath and feeding tube placement.  RTC in 1 week for labs, office visit and continuation of chemotherapy.

## 2024-04-10 NOTE — Progress Notes (Signed)
 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 Autumn and Dr. Izell.  He is receiving carboplatin  and paclitaxel  with final radiation therapy scheduled for August 27.  Past medical history includes AICD, anxiety, CAD, CHF, DM2, heart attack, hypertension, hyperlipidemia, hypothyroidism, left renal artery stenosis, pacemaker, CABG.  PEG: April 09, 2024  Medications include vitamin D , Decadron , Lasix , Glucotrol , Synthroid , Zofran , MiraLAX , Compazine , and Lokelma .  Labs include glucose 164 and BUN 33.  Height: 6 feet 0 inches. Weight: 190 pounds 11.2 ounces on July 10. Usual body weight: Weight 198 pounds 6.6 ounces on Feb 13, 2024. BMI: 25.86.  Patient reports long history of dysphagia with bread.  Reports he currently follows a low-sodium diet and limits sodium to 2000 mg a day.  His wife monitors the sodium in the food he eats.  She tries to limit concentrated sweets for his blood sugars.  He likes Ensure products and has been drinking 1 a day at home.  He was educated on flushing his feeding tube and PEG care by nurse navigator today.  Reinforced importance of completing swallow exercises.  Nutrition diagnosis:  Predicted sub-optimal energy intake related to cancer and associated treatments as evidenced by history or presence of a condition for which research shows an increased incidence of sub-optimal energy intake.  Intervention: Educated to consume small frequent meals and snacks.   Strive for weight maintenance. Focus on high-protein foods as tolerated. Continue Ensure Plus or equivalent once daily. Enforced importance of flushing feeding tube daily and daily PEG care. Encouraged baking soda and salt water gargle. Provided nutrition facts sheets.  Provided contact information.  Monitoring, evaluation, goals: Patient will tolerate adequate calories and protein to minimize weight loss throughout  treatment.  Next visit: July 17 during infusion.  **Disclaimer: This note was dictated with voice recognition software. Similar sounding words can inadvertently be transcribed and this note may contain transcription errors which may not have been corrected upon publication of note.**

## 2024-04-10 NOTE — Assessment & Plan Note (Signed)
 Chronic kidney disease with altered kidney function. Cisplatin is avoided due to nephrotoxicity risk. Carboplatin  is chosen as it poses less risk to kidney function. Regular monitoring of kidney function is necessary to adjust chemotherapy dosing and prevent further kidney damage. -Creatinine level improved to 1.2 from 1.6-1.8 - Monitor kidney function regularly during chemotherapy. - Adjust chemotherapy dosing based on kidney function. - Avoid cisplatin due to nephrotoxicity risk.

## 2024-04-10 NOTE — Progress Notes (Signed)
 Labs were drawn yesterday so labs not needed today per Dr. Autumn. [facility] accessed with no issues.

## 2024-04-11 ENCOUNTER — Inpatient Hospital Stay: Admitting: Nutrition

## 2024-04-11 ENCOUNTER — Ambulatory Visit
Admission: RE | Admit: 2024-04-11 | Discharge: 2024-04-11 | Disposition: A | Discharge status: Home / Self Care | Source: Ambulatory Visit | Attending: Radiation Oncology

## 2024-04-11 ENCOUNTER — Other Ambulatory Visit: Payer: Self-pay

## 2024-04-11 ENCOUNTER — Encounter: Payer: Self-pay | Admitting: Nutrition

## 2024-04-11 ENCOUNTER — Telehealth: Payer: Self-pay | Admitting: *Deleted

## 2024-04-11 DIAGNOSIS — Z510 Encounter for antineoplastic radiation therapy: Secondary | ICD-10-CM | POA: Diagnosis not present

## 2024-04-11 LAB — RAD ONC ARIA SESSION SUMMARY
Course Elapsed Days: 1
Plan Fractions Treated to Date: 2
Plan Prescribed Dose Per Fraction: 2 Gy
Plan Total Fractions Prescribed: 35
Plan Total Prescribed Dose: 70 Gy
Reference Point Dosage Given to Date: 4 Gy
Reference Point Session Dosage Given: 2 Gy
Session Number: 2

## 2024-04-11 NOTE — Telephone Encounter (Signed)
-----   Message from Nurse Dawna HERO sent at 04/10/2024  5:01 PM EDT ----- Regarding: Dr. Autumn 1st tx f/u call Dr. Autumn 1st tx f/u call. Taxol  carbo - tolerated well

## 2024-04-11 NOTE — Progress Notes (Signed)
 Patient and wife stopped by my office requesting help with his feeding tube. States they could not find a nurse to help. I was able to take both the feed and med port cap of his tube off without problem.   Wife concerned with yellow fluid in feeding tube. Explained this was liquids from his stomach working the way up into his tube. Educated that the liquid backup in the feeding tube was not concerning and they could do an additional water flush if they wanted.   Patient and husband appreciative.

## 2024-04-11 NOTE — Telephone Encounter (Signed)
 Called & spoke with pt's wife who states that pt did well with treatment but they were having trouble with getting cap off of feeding tube & was waiting to see Heron Prost.  Lost connection.  Will ask Barb to let us  know if he needs anything else.

## 2024-04-11 NOTE — Progress Notes (Signed)
 Wife contacted this RD by telephone with questions regarding feeding tube.  She reports she was able to give a free water flush without too much difficulty.  She had questions regarding the exact process and wanted to be sure she did it correctly.  Also reports she cannot get the cap off the feeding port today.  Very concerned about what she will do if the medication port cannot be used for water flush.  Also reported struggling a bit with dressing change today.  States patient complained of pain when she was cleaning around the tube today.  He stated she was pressing too hard while cleaning using the Q-tip.  Also concerned she had to use tape to secure feeding tube.  Patient is not using feeding tube for nutrition at this time.  Recommended patient stop by nursing today after radiation therapy to request assistance in getting feeding tube cap off.  Assured wife we would continue to work with her until she felt confident with flushing the tube and changing his dressings.  I have encouraged her to contact me before they leave radiation therapy if they have not successfully been able to remove the top of his ENFit tube.  She is agreeable and appreciative of plan.

## 2024-04-14 ENCOUNTER — Ambulatory Visit

## 2024-04-14 ENCOUNTER — Other Ambulatory Visit: Payer: Self-pay

## 2024-04-14 ENCOUNTER — Ambulatory Visit
Admission: RE | Admit: 2024-04-14 | Discharge: 2024-04-14 | Disposition: A | Discharge status: Home / Self Care | Source: Ambulatory Visit | Attending: Radiation Oncology | Admitting: Radiation Oncology

## 2024-04-14 DIAGNOSIS — Z510 Encounter for antineoplastic radiation therapy: Secondary | ICD-10-CM | POA: Diagnosis not present

## 2024-04-14 LAB — RAD ONC ARIA SESSION SUMMARY
Course Elapsed Days: 4
Plan Fractions Treated to Date: 3
Plan Prescribed Dose Per Fraction: 2 Gy
Plan Total Fractions Prescribed: 35
Plan Total Prescribed Dose: 70 Gy
Reference Point Dosage Given to Date: 6 Gy
Reference Point Session Dosage Given: 2 Gy
Session Number: 3

## 2024-04-15 ENCOUNTER — Ambulatory Visit
Admission: RE | Admit: 2024-04-15 | Discharge: 2024-04-15 | Disposition: A | Discharge status: Home / Self Care | Source: Ambulatory Visit | Attending: Radiation Oncology | Admitting: Radiation Oncology

## 2024-04-15 ENCOUNTER — Other Ambulatory Visit: Payer: Self-pay

## 2024-04-15 ENCOUNTER — Telehealth: Payer: Self-pay

## 2024-04-15 ENCOUNTER — Other Ambulatory Visit: Payer: Self-pay | Admitting: "Endocrinology

## 2024-04-15 ENCOUNTER — Other Ambulatory Visit: Payer: Self-pay | Admitting: Radiation Oncology

## 2024-04-15 DIAGNOSIS — Z510 Encounter for antineoplastic radiation therapy: Secondary | ICD-10-CM | POA: Diagnosis not present

## 2024-04-15 DIAGNOSIS — R221 Localized swelling, mass and lump, neck: Secondary | ICD-10-CM

## 2024-04-15 DIAGNOSIS — C090 Malignant neoplasm of tonsillar fossa: Secondary | ICD-10-CM

## 2024-04-15 DIAGNOSIS — I340 Nonrheumatic mitral (valve) insufficiency: Secondary | ICD-10-CM

## 2024-04-15 LAB — RAD ONC ARIA SESSION SUMMARY
Course Elapsed Days: 5
Plan Fractions Treated to Date: 4
Plan Prescribed Dose Per Fraction: 2 Gy
Plan Total Fractions Prescribed: 35
Plan Total Prescribed Dose: 70 Gy
Reference Point Dosage Given to Date: 8 Gy
Reference Point Session Dosage Given: 2 Gy
Session Number: 4

## 2024-04-15 MED ORDER — LIDOCAINE VISCOUS HCL 2 % MT SOLN
OROMUCOSAL | 3 refills | Status: AC
Start: 2024-04-15 — End: ?

## 2024-04-15 MED ORDER — SONAFINE EX EMUL
1.0000 | Freq: Two times a day (BID) | CUTANEOUS | Status: DC
  Administered 2024-04-15: 1 via TOPICAL

## 2024-04-15 MED ORDER — GLIPIZIDE ER 5 MG PO TB24: 5.0000 mg | ORAL_TABLET | Freq: Every day | ORAL | 1 refills | Status: DC

## 2024-04-15 NOTE — Telephone Encounter (Signed)
 Pt's wife called stating pt's blood glucose had been elevated a few days ago. Pt's BG on 04/10/24 was 97 that morning, he was scheduled for PEG placement that morning and at 9:30am his BG was 190 right before procedure Pt BG readings.   Date Before breakfast Before lunch Before supper Bedtime  04/12/24 189     04/13/24 237     04/14/24 138     0715/25 144       Pt taking: Farxiga  10mg  daily and Glipizide  2.5mg  daily. Pt has not started any type of tube feeding just receiving PEG flushes.

## 2024-04-15 NOTE — Telephone Encounter (Signed)
 Spoke with pt's wife advising her to increase pt's Glipizide  XL to 5mg  daily, continue Farxiga  10mg  daily and to monitor glucose more frequently for at least the next 2 weeks four times daily, before meals and bed time, report if BG >200 x 3 per Dr.Nida's orders. Understanding voiced.

## 2024-04-16 ENCOUNTER — Ambulatory Visit
Admission: RE | Admit: 2024-04-16 | Discharge: 2024-04-16 | Disposition: A | Discharge status: Home / Self Care | Source: Ambulatory Visit | Attending: Radiation Oncology

## 2024-04-16 ENCOUNTER — Other Ambulatory Visit: Payer: Self-pay

## 2024-04-16 DIAGNOSIS — Z510 Encounter for antineoplastic radiation therapy: Secondary | ICD-10-CM | POA: Diagnosis not present

## 2024-04-16 LAB — RAD ONC ARIA SESSION SUMMARY
Course Elapsed Days: 6
Plan Fractions Treated to Date: 5
Plan Prescribed Dose Per Fraction: 2 Gy
Plan Total Fractions Prescribed: 35
Plan Total Prescribed Dose: 70 Gy
Reference Point Dosage Given to Date: 10 Gy
Reference Point Session Dosage Given: 2 Gy
Session Number: 5

## 2024-04-17 ENCOUNTER — Other Ambulatory Visit

## 2024-04-17 ENCOUNTER — Other Ambulatory Visit: Payer: Self-pay

## 2024-04-17 ENCOUNTER — Encounter: Payer: Self-pay | Admitting: Oncology

## 2024-04-17 ENCOUNTER — Inpatient Hospital Stay: Admitting: Nutrition

## 2024-04-17 ENCOUNTER — Ambulatory Visit
Admission: RE | Admit: 2024-04-17 | Discharge: 2024-04-17 | Disposition: A | Discharge status: Home / Self Care | Source: Ambulatory Visit | Attending: Radiation Oncology | Admitting: Radiation Oncology

## 2024-04-17 ENCOUNTER — Inpatient Hospital Stay

## 2024-04-17 ENCOUNTER — Inpatient Hospital Stay (HOSPITAL_BASED_OUTPATIENT_CLINIC_OR_DEPARTMENT_OTHER): Admitting: Oncology

## 2024-04-17 VITALS — BP 94/55 | HR 75 | Temp 98.0°F | Resp 16 | Wt 193.8 lb

## 2024-04-17 DIAGNOSIS — I5022 Chronic systolic (congestive) heart failure: Secondary | ICD-10-CM

## 2024-04-17 DIAGNOSIS — N1831 Chronic kidney disease, stage 3a: Secondary | ICD-10-CM

## 2024-04-17 DIAGNOSIS — E1122 Type 2 diabetes mellitus with diabetic chronic kidney disease: Secondary | ICD-10-CM | POA: Diagnosis not present

## 2024-04-17 DIAGNOSIS — N1832 Chronic kidney disease, stage 3b: Secondary | ICD-10-CM | POA: Diagnosis not present

## 2024-04-17 DIAGNOSIS — C090 Malignant neoplasm of tonsillar fossa: Secondary | ICD-10-CM

## 2024-04-17 DIAGNOSIS — Z95828 Presence of other vascular implants and grafts: Secondary | ICD-10-CM

## 2024-04-17 DIAGNOSIS — Z510 Encounter for antineoplastic radiation therapy: Secondary | ICD-10-CM | POA: Diagnosis not present

## 2024-04-17 LAB — CBC WITH DIFFERENTIAL (CANCER CENTER ONLY)
Abs Immature Granulocytes: 0.04 K/uL (ref 0.00–0.07)
Basophils Absolute: 0 K/uL (ref 0.0–0.1)
Basophils Relative: 0 %
Eosinophils Absolute: 0.2 K/uL (ref 0.0–0.5)
Eosinophils Relative: 4 %
HCT: 43.1 % (ref 39.0–52.0)
Hemoglobin: 14.7 g/dL (ref 13.0–17.0)
Immature Granulocytes: 1 %
Lymphocytes Relative: 11 %
Lymphs Abs: 0.6 K/uL — ABNORMAL LOW (ref 0.7–4.0)
MCH: 30.9 pg (ref 26.0–34.0)
MCHC: 34.1 g/dL (ref 30.0–36.0)
MCV: 90.5 fL (ref 80.0–100.0)
Monocytes Absolute: 0.2 K/uL (ref 0.1–1.0)
Monocytes Relative: 3 %
Neutro Abs: 4.5 K/uL (ref 1.7–7.7)
Neutrophils Relative %: 81 %
Platelet Count: 108 K/uL — ABNORMAL LOW (ref 150–400)
RBC: 4.76 MIL/uL (ref 4.22–5.81)
RDW: 12.6 % (ref 11.5–15.5)
WBC Count: 5.6 K/uL (ref 4.0–10.5)
nRBC: 0 % (ref 0.0–0.2)

## 2024-04-17 LAB — CMP (CANCER CENTER ONLY)
ALT: 46 U/L — ABNORMAL HIGH (ref 0–44)
AST: 32 U/L (ref 15–41)
Albumin: 3.5 g/dL (ref 3.5–5.0)
Alkaline Phosphatase: 55 U/L (ref 38–126)
Anion gap: 5 (ref 5–15)
BUN: 61 mg/dL — ABNORMAL HIGH (ref 8–23)
CO2: 27 mmol/L (ref 22–32)
Calcium: 9.1 mg/dL (ref 8.9–10.3)
Chloride: 106 mmol/L (ref 98–111)
Creatinine: 1.5 mg/dL — ABNORMAL HIGH (ref 0.61–1.24)
GFR, Estimated: 48 mL/min — ABNORMAL LOW (ref >60)
Glucose, Bld: 223 mg/dL — ABNORMAL HIGH (ref 70–99)
Potassium: 5.1 mmol/L (ref 3.5–5.1)
Sodium: 138 mmol/L (ref 135–145)
Total Bilirubin: 0.9 mg/dL (ref 0.0–1.2)
Total Protein: 6.4 g/dL — ABNORMAL LOW (ref 6.5–8.1)

## 2024-04-17 LAB — RAD ONC ARIA SESSION SUMMARY
Course Elapsed Days: 7
Plan Fractions Treated to Date: 6
Plan Prescribed Dose Per Fraction: 2 Gy
Plan Total Fractions Prescribed: 35
Plan Total Prescribed Dose: 70 Gy
Reference Point Dosage Given to Date: 12 Gy
Reference Point Session Dosage Given: 2 Gy
Session Number: 6

## 2024-04-17 MED ORDER — DEXAMETHASONE SODIUM PHOSPHATE 10 MG/ML IJ SOLN
10.0000 mg | Freq: Once | INTRAMUSCULAR | Status: AC
  Administered 2024-04-17: 10 mg via INTRAVENOUS
  Filled 2024-04-17: qty 1

## 2024-04-17 MED ORDER — DIPHENHYDRAMINE HCL 50 MG/ML IJ SOLN
25.0000 mg | Freq: Once | INTRAMUSCULAR | Status: AC
  Administered 2024-04-17: 25 mg via INTRAVENOUS
  Filled 2024-04-17: qty 1

## 2024-04-17 MED ORDER — SODIUM CHLORIDE 0.9% FLUSH
10.0000 mL | INTRAVENOUS | Status: DC | PRN
  Administered 2024-04-17: 10 mL

## 2024-04-17 MED ORDER — HEPARIN SOD (PORK) LOCK FLUSH 100 UNIT/ML IV SOLN
500.0000 [IU] | Freq: Once | INTRAVENOUS | Status: AC | PRN
  Administered 2024-04-17: 500 [IU]

## 2024-04-17 MED ORDER — SODIUM CHLORIDE 0.9 % IV SOLN
45.0000 mg/m2 | Freq: Once | INTRAVENOUS | Status: AC
  Administered 2024-04-17: 96 mg via INTRAVENOUS
  Filled 2024-04-17: qty 16

## 2024-04-17 MED ORDER — FAMOTIDINE IN NACL 20-0.9 MG/50ML-% IV SOLN
20.0000 mg | Freq: Once | INTRAVENOUS | Status: AC
  Administered 2024-04-17: 20 mg via INTRAVENOUS
  Filled 2024-04-17: qty 50

## 2024-04-17 MED ORDER — SODIUM CHLORIDE 0.9% FLUSH
10.0000 mL | Freq: Once | INTRAVENOUS | Status: AC
  Administered 2024-04-17: 10 mL

## 2024-04-17 MED ORDER — SODIUM CHLORIDE 0.9 % IV SOLN: INTRAVENOUS | Status: DC

## 2024-04-17 MED ORDER — SODIUM CHLORIDE 0.9 % IV SOLN
115.3500 mg | Freq: Once | INTRAVENOUS | Status: AC
  Administered 2024-04-17: 120 mg via INTRAVENOUS
  Filled 2024-04-17: qty 12

## 2024-04-17 MED ORDER — PALONOSETRON HCL INJECTION 0.25 MG/5ML
0.2500 mg | Freq: Once | INTRAVENOUS | Status: AC
  Administered 2024-04-17: 0.25 mg via INTRAVENOUS
  Filled 2024-04-17: qty 5

## 2024-04-17 NOTE — Progress Notes (Signed)
 Patient seen by Dr. Chinita Pasam today  Vitals are within treatment parameters:Yes   Dr. Autumn aware of hypotension. Pt has CHF.  Labs are within treatment parameters: Yes   Treatment plan has been signed: Yes   Per physician team, Patient is ready for treatment and there are NO modifications to the treatment plan.   Dr. Autumn aware of hypotension. Pt has CHF. Proceed with tx per Dr. Autumn.

## 2024-04-17 NOTE — Assessment & Plan Note (Signed)
 Blood glucose levels are elevated, with recent readings over 200 mg/dL. Glipizide  dose was increased by the endocrinologist. - Continue monitoring blood glucose levels and adjust treatment as per endocrinologist's guidance.

## 2024-04-17 NOTE — Assessment & Plan Note (Addendum)
 Congestive heart failure with fluid restriction of 64 ounces per day. Monitoring of fluid intake is necessary to prevent exacerbation of heart failure. Coordination with cardiologist for ongoing management. - Maintain fluid restriction of 64 ounces per day. - Coordinate with cardiologist for ongoing management of heart failure. - Low blood pressure and dehydration are concerns.  Will monitor him closely

## 2024-04-17 NOTE — Patient Instructions (Signed)
 [facility] - A DEPT OF [facility]  Discharge Instructions: Thank you for choosing [facility] to provide your oncology and hematology care.   If you have a lab appointment with the Cancer Center, please go directly to the Cancer Center and check in at the registration area.   Wear comfortable clothing and clothing appropriate for easy access to any Portacath or PICC line.   We strive to give you quality time with your provider. You may need to reschedule your appointment if you arrive late (15 or more minutes).  Arriving late affects you and other patients whose appointments are after yours.  Also, if you miss three or more appointments without notifying the office, you may be dismissed from the clinic at the provider's discretion.      For prescription refill requests, have your pharmacy contact our office and allow 72 hours for refills to be completed.    Today you received the following chemotherapy and/or immunotherapy agents:   Paclitaxel, Carboplatin    To help prevent nausea and vomiting after your treatment, we encourage you to take your nausea medication as directed.  BELOW ARE SYMPTOMS THAT SHOULD BE REPORTED IMMEDIATELY: *FEVER GREATER THAN 100.4 F (38 C) OR HIGHER *CHILLS OR SWEATING *NAUSEA AND VOMITING THAT IS NOT CONTROLLED WITH YOUR NAUSEA MEDICATION *UNUSUAL SHORTNESS OF BREATH *UNUSUAL BRUISING OR BLEEDING *URINARY PROBLEMS (pain or burning when urinating, or frequent urination) *BOWEL PROBLEMS (unusual diarrhea, constipation, pain near the anus) TENDERNESS IN MOUTH AND THROAT WITH OR WITHOUT PRESENCE OF ULCERS (sore throat, sores in mouth, or a toothache) UNUSUAL RASH, SWELLING OR PAIN  UNUSUAL VAGINAL DISCHARGE OR ITCHING   Items with * indicate a potential emergency and should be followed up as soon as possible or go to the Emergency Department if any problems should occur.  Please show the CHEMOTHERAPY ALERT CARD or  IMMUNOTHERAPY ALERT CARD at check-in to the Emergency Department and triage nurse.  Should you have questions after your visit or need to cancel or reschedule your appointment, please contact [facility] - A DEPT [facility]  Dept: [phone]  and follow the prompts.  Office hours are 8:00 a.m. to 4:30 p.m. Monday - Friday. Please note that voicemails left after 4:00 p.m. may not be returned until the following business day.  We are closed weekends and major holidays. You have access to a nurse at all times for urgent questions. Please call the main number to the clinic Dept: [phone] and follow the prompts.   For any non-urgent questions, you may also contact your provider using MyChart. We now offer e-Visits for anyone 63 and older to request care online for non-urgent symptoms. For details visit [email or web address].   Also download the MyChart app! Go to the app store, search "MyChart", open the app, select [place], and log in with your MyChart username and password.

## 2024-04-17 NOTE — Assessment & Plan Note (Signed)
 Please review oncology history for additional details and timeline of events.    cT3, cN0, cM0, p16 positive disease, stage II.  Patient had consultation with Dr. Floretta, otolaryngology at [facility] on 03/19/2024.  Given his cardiac history, he was felt to be a high risk candidate for surgery and hence chemoradiation was recommended.  Given his CKD, he is not a candidate for cisplatin.  Hence plan made to proceed with weekly carboplatin  and paclitaxel  during the course of radiation.  Monitoring of kidney function and blood counts is essential to adjust chemotherapy dosing and manage potential side effects such as nausea, vomiting, and fatigue. Anti-nausea medications are provided to manage these effects.  If further renal dysfunction is noted, chemotherapy will be adjusted or skipped.   He started concurrent chemoradiation with weekly carboplatin  and paclitaxel  from 04/10/2024.  He did experience fatigue.  Labs today reveal creatinine of 1.5, closer to his baseline.  Platelet count was slightly decreased predating the chemotherapy initiation.  Platelet count is 108,000 today.  Plan is to continue chemotherapy as long as platelet count is above 50,000.  Given fatigue symptoms, and to avoid further thrombocytopenia, we will dose reduce carboplatin  to AUC 1.5 and proceed with cycle 2 of chemotherapy today with carboplatin  and paclitaxel .  He is established with dietitian, speech pathology and physical therapy for supportive care.  RTC in 1 week for labs, office visit and continuation of chemotherapy.

## 2024-04-17 NOTE — Progress Notes (Signed)
 Nutrition follow-up completed with patient during infusion for tonsil cancer, positive p16.  He receives concurrent chemoradiation therapy with carboplatin  and paclitaxel  and is followed by Dr. Autumn and Dr. Izell.  His final radiation therapy is scheduled for August 27.  PEG: April 09, 2024  Weight: 193 pounds 12.8 ounces July 17 194 pounds June 12 198 pounds 6.6 ounces May 19  Labs include glucose 223, BUN 61, creatinine 1.5.  Patient reports increased appetite.  He is eating small amounts of food more often.  He has been drinking chocolate Ensure without difficulty but has recently experienced a metallic taste with the chocolate flavor.  He is flushing his feeding tube daily without difficulty.  Reports MD recently looked at his feeding tube site and reports it looked fine.  Nutrition diagnosis:  Predicted sub-optimal energy intake continues.  Intervention: Continue strategies for small amounts of food more often focusing on higher calorie, higher protein foods. Continue Ensure plus or equivalent by mouth to provide additional calories and protein. Continue daily water flushes via PEG Use baking soda and salt water gargles to minimize dry mouth and thickened saliva.  Monitoring, evaluation, goals: Tolerate adequate calories and protein to minimize weight loss. Will begin using feeding tube when oral intake decreases or weight loss identified.  Next visit: Thursday, July 24 during infusion.  **Disclaimer: This note was dictated with voice recognition software. Similar sounding words can inadvertently be transcribed and this note may contain transcription errors which may not have been corrected upon publication of note.**

## 2024-04-17 NOTE — Progress Notes (Signed)
 [facility]  ONCOLOGY CLINIC PROGRESS NOTE   Patient Care Team: Croitoru, Jerel, MD as PCP - General (Cardiology) Inocencio Soyla Lunger, MD as PCP - Electrophysiology (Cardiology) Rolan Ezra RAMAN, MD as PCP - Cardiology (Cardiology) Skeet Juliene SAUNDERS, DO as Consulting Physician (Neurology) Okey Burns, MD as Consulting Physician (Otolaryngology) Izell Domino, MD as Consulting Physician (Radiation Oncology) Autumn Millman, MD as Consulting Physician (Oncology) Malmfelt, Delon CROME, RN as Oncology Nurse Navigator  PATIENT NAME: Christian Bowman   MR#: [id number] DOB: [birth year]  Date of visit: 04/17/2024   ASSESSMENT & PLAN:   Christian Bowman is a 78 y.o. 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   Primary cancer of tonsillar fossa (HCC) Please review oncology history for additional details and timeline of events.    cT3, cN0, cM0, p16 positive disease, stage II.  Patient had consultation with Dr. Floretta, otolaryngology at [facility] on 03/19/2024.  Given his cardiac history, he was felt to be a high risk candidate for surgery and hence chemoradiation was recommended.  Given his CKD, he is not a candidate for cisplatin.  Hence plan made to proceed with weekly carboplatin  and paclitaxel  during the course of radiation.  Monitoring of kidney function and blood counts is essential to adjust chemotherapy dosing and manage potential side effects such as nausea, vomiting, and fatigue. Anti-nausea medications are provided to manage these effects.  If further renal dysfunction is noted, chemotherapy will be adjusted or skipped.   He started concurrent chemoradiation with weekly  carboplatin  and paclitaxel  from 04/10/2024.  He did experience fatigue.  Labs today reveal creatinine of 1.5, closer to his baseline.  Platelet count was slightly decreased predating the chemotherapy initiation.  Platelet count is 108,000 today.  Plan is to continue chemotherapy as long as platelet count is above 50,000.  Given fatigue symptoms, and to avoid further thrombocytopenia, we will dose reduce carboplatin  to AUC 1.5 and proceed with cycle 2 of chemotherapy today with carboplatin  and paclitaxel .  He is established with dietitian, speech pathology and physical therapy for supportive care.  RTC in 1 week for labs, office visit and continuation of chemotherapy.  CKD (chronic kidney disease) stage 3, GFR 30-59 ml/min (HCC) Chronic kidney disease with altered kidney function. Cisplatin is avoided due to nephrotoxicity risk. Carboplatin  is chosen as it poses less risk to kidney function. Regular monitoring of kidney function is necessary to adjust chemotherapy dosing and prevent further kidney damage.  -His baseline creatinine level is 1.6-1.8.  Today creatinine is 1.5. - Monitor kidney function regularly during chemotherapy. - Adjust chemotherapy dosing based on kidney function. - Avoid cisplatin due to nephrotoxicity risk.  Type 2 diabetes mellitus with stage 3a chronic kidney disease, without long-term current use of insulin  (HCC) Blood glucose levels are elevated, with recent readings over 200 mg/dL. Glipizide  dose was increased by the endocrinologist. - Continue monitoring blood glucose levels and adjust treatment as per endocrinologist's guidance.  Chronic systolic CHF (congestive heart failure) (HCC) Congestive heart failure with fluid restriction of 64 ounces per day. Monitoring of fluid intake is necessary to prevent exacerbation of heart failure. Coordination with cardiologist for ongoing management. - Maintain fluid restriction of 64 ounces per day. - Coordinate with  cardiologist for ongoing management of heart failure. - Low blood pressure and dehydration are concerns.  Will monitor him closely   Fatigue Fatigue is present, possibly related to chemotherapy and low blood pressure. - Encourage staying active and upright for at least half of the waking hours to improve energy levels.    I reviewed lab results and outside records for this visit and discussed relevant results with the patient. Diagnosis, plan of care and treatment options were also discussed in detail with the patient. Opportunity provided to ask questions and answers provided to his apparent satisfaction. Provided instructions to call our clinic with any problems, questions or concerns prior to return visit. I recommended to continue follow-up with PCP and sub-specialists. He verbalized understanding and agreed with the plan.   NCCN guidelines have been consulted in the planning of this patient's care.  I spent a total of 30 minutes during this encounter with the patient including review of chart and various tests results, discussions about plan of care and coordination of care plan.   Chinita Patten, MD  04/17/2024 2:31 PM   [facility] - A DEPT OF [facility] [address] [place] [address] Dept: [phone] Dept Fax: [phone]    CHIEF COMPLAINT/ REASON FOR VISIT:   Squamous cell carcinoma of the right tonsil, cT3, cN0, cM0, p16 positive, stage II disease  Current Treatment: Concurrent chemoradiation with weekly carboplatin  and paclitaxel , started from 04/10/2024.  INTERVAL HISTORY:    Discussed the use of AI scribe software for clinical note transcription with the patient, who gave verbal consent to proceed.  History of Present Illness  Christian Bowman is a 78 year old male with chronic kidney disease and congestive heart failure who presents with fatigue and altered taste after receiving cycle 1 of  chemotherapy with carboplatin  and paclitaxel  last week.  He experiences fatigue and a lack of appetite, describing that food does not appeal to him. No nausea is present except for a brief episode earlier in the week, managed with Zofran . He consumes two [employer] nutritional drinks daily to maintain protein levels and has eliminated junk food from his diet.  His kidney function, indicated by creatinine levels, was slightly improved last week at 1.2 but has returned to his baseline of 1.5. He has a history of low platelet counts, with the current count at 108,000. White and red blood cell counts are normal. He is undergoing chemotherapy, which requires dosage adjustments based on kidney function and platelet levels.  He has congestive heart failure and is on a fluid restriction of 64 ounces per day. He reports low blood pressure today and confirms taking his blood pressure medications, including carvedilol  and valsartan , twice daily. He also takes glipizide  for diabetes, which was recently increased by his endocrinologist due to elevated blood sugar readings, with a recent reading of 223.  He has a feeding tube, which his caregiver monitors for signs of infection. There was concern about redness and discomfort around the tube site. He reports discomfort from the tube moving but no soreness.  He spends a significant amount of time resting on the couch. No need for a wheelchair and he is able to walk around at home.     I have reviewed the past medical history, past surgical history, social history and family history with the patient and they are unchanged from previous note.  HISTORY OF PRESENT ILLNESS:   ONCOLOGY HISTORY:  78 y.o. gentleman who presented to the ED on 02/18/24 for an episode of hemoptysis and complains of dysphagia.   Patient reports longstanding history (7-8 years) of a large right neck mass for which he had a workup and was assured that it was benign. Most recently, her presented  to the ER on 02/18/2024 due to an episode of hemoptysis when he coughed up a large bright red blood clot. To further investigate his symptoms, he underwent a CT neck in the ER showing a large tonsillar mass measuring 2.2 x 3.1 x 3.7 cm on the right side with necrotic lymph nodes concerning for malignancy and a 6.5 x 4.0 cm predominantly low attenuation lesion centered at the right level 2 station, concerning for a cystic/necrotic lymph node. Scan also indicated a nonspecific 5 mm cystic-appearing lesion within the vallecula on the right.       Subsequently, the patient saw Dr. Soldatova who preformed a flexible laryngoscopy showing evidence of a large exophytic mass involving right tonsil and soft palate but without any obvious involvement of the base of the tongue.   Biopsy of right tonsillar mass on 02/18/24 revealed: squamous cell carcinoma, p16 positive.    Staging PET scan on 02/28/24 revealing a hypermetabolic mass centered in the right palatine tonsil pharyngeal region extending to the glossal tonsillar sulcus and the soft palate. No additional areas of abnormal uptake at this time to suggest spread of disease. Scan also noted small lung nodule seen previously are not well seen on the current examination but there is significant motion.    CT chest preformed on 02/18/24 showed a new 5 mm nodule within the posterior and medial left lower lobe with follow up recommended. No acute findings within the chest. No explanation for patient's hemoptysis within the chest and mild diffuse bronchial wall thickening is nonspecific, but can be seen in the setting of bronchitis.    Swallowing issues, if any: Dysphagia with bread. Patient denies odynophagia.    Weight Changes: Weight has remained stable.    Pain status: Patient denies pain.    Other symptoms: right-sided neck swelling, throat clearing    He has a cystic lesion in the mid right forehead which he reports is from a traumatic injury as a child    He also reports that he had a cystic lesion in the left neck which regressed several years ago after appearing at the same time as the right neck mass.    Patient had consultation with Dr. Floretta, otolaryngology at [facility] on 03/19/2024.  Given his cardiac history, he was felt to be a high risk candidate for surgery and hence chemoradiation was recommended.   Given his CKD, he is not a candidate for cisplatin.  Hence plan is to proceed with weekly carboplatin  and paclitaxel  during the course of radiation.  Started treatments from 04/10/2024.  Oncology History  Primary cancer of tonsillar fossa (HCC)  03/04/2024 Initial Diagnosis   Primary cancer of tonsillar fossa (HCC)   03/04/2024 Cancer Staging   Staging form: Pharynx - HPV-Mediated Oropharynx, AJCC 8th Edition - Clinical stage from 03/04/2024: Stage II (cT3, cN0, cM0, p16+) - Signed by Autumn Millman, MD on 03/21/2024 Stage prefix: Initial diagnosis   04/10/2024 -  Chemotherapy   Patient is on Treatment Plan : HEAD/NECK Carboplatin  + Paclitaxel  + XRT q7d         REVIEW OF SYSTEMS:   Review of Systems - Oncology  All other pertinent systems were reviewed with the patient and  are negative.  ALLERGIES: He is allergic to xanax [alprazolam].  MEDICATIONS:  Current Outpatient Medications  Medication Sig Dispense Refill   acetaminophen  (TYLENOL ) 325 MG tablet Take 325 mg by mouth every 6 (six) hours as needed for mild pain (pain score 1-3).     apixaban  (ELIQUIS ) 5 MG TABS tablet Take 1 tablet (5 mg total) by mouth 2 (two) times daily. 60 tablet 6   Blood Glucose Monitoring Suppl (ACCU-CHEK GUIDE ME) w/Device KIT 1 Piece by Does not apply route as directed. 1 kit 0   carvedilol  (COREG ) 12.5 MG tablet TAKE 1 & 1/2 (ONE & ONE-HALF) TABLETS BY MOUTH TWICE DAILY WITH A MEAL 180 tablet 3   Cholecalciferol  (VITAMIN D ) 50 MCG (2000 UT) tablet Take 2,000 Units by mouth daily with breakfast.     dapagliflozin  propanediol (FARXIGA ) 10 MG TABS  tablet Take 1 tablet (10 mg total) by mouth daily. 90 tablet 3   dexamethasone  (DECADRON ) 4 MG tablet Take 2 tablets daily for 2 days, start the day after chemotherapy. Take with food. 30 tablet 1   digoxin  (LANOXIN ) 0.125 MG tablet Take 1/2 (one-half) tablet by mouth once daily 45 tablet 3   eplerenone  (INSPRA ) 50 MG tablet Take 1 tablet (50 mg total) by mouth daily. 90 tablet 3   furosemide  (LASIX ) 20 MG tablet Take 1 tablet (20 mg total) by mouth daily as needed (for a weight over 190 lbs). 30 tablet 11   glipiZIDE  (GLUCOTROL  XL) 5 MG 24 hr tablet Take 1 tablet (5 mg total) by mouth daily with breakfast. 90 tablet 1   glucose blood (ACCU-CHEK GUIDE TEST) test strip Use to test blood glucose daily as instructed 100 each 2   levothyroxine  (SYNTHROID ) 50 MCG tablet Take 1 tablet (50 mcg total) by mouth daily before breakfast. 90 tablet 1   lidocaine  (XYLOCAINE ) 2 % solution Patient: Mix 1part 2% viscous lidocaine , 1part H20. Swish & swallow 10mL of diluted mixture, 30min before meals and at bedtime, up to QID 200 mL 3   lidocaine -prilocaine  (EMLA ) cream Apply to affected area once 30 g 3   neomycin-bacitracin-polymyxin (NEOSPORIN) ointment Apply 1 application  topically daily as needed for wound care.     nitroGLYCERIN  (NITROSTAT ) 0.4 MG SL tablet DISSOLVE 1 TABLET UNDER THE TONGUE EVERY 5 MINUTES AS NEEDED FOR CHEST PAIN, DO NOT EXCEED 3 DOSES IN 15 MINUTES 25 tablet 1   ondansetron  (ZOFRAN ) 8 MG tablet Take 1 tablet (8 mg total) by mouth every 8 (eight) hours as needed for nausea or vomiting. Start on the third day after chemotherapy. 30 tablet 1   polyethylene glycol (MIRALAX ) 17 g packet Take 17 g by mouth daily as needed for mild constipation or moderate constipation.     prochlorperazine  (COMPAZINE ) 10 MG tablet Take 1 tablet (10 mg total) by mouth every 6 (six) hours as needed for nausea or vomiting. 30 tablet 1   rosuvastatin  (CRESTOR ) 10 MG tablet Take 1 tablet (10 mg total) by mouth daily.  90 tablet 3   sacubitril -valsartan  (ENTRESTO ) 97-103 MG Take 1 tablet by mouth twice daily 180 tablet 3   sodium zirconium cyclosilicate  (LOKELMA ) 10 g PACK packet DISSOLVE 1 PACKET IN WATER & DRINK ONCE DAILY 90 each 3   Tetrahydrozoline HCl (VISINE OP) Place 1 drop into both eyes daily as needed (irritation).     No current facility-administered medications for this visit.   Facility-Administered Medications Ordered in Other Visits  Medication Dose Route Frequency Provider Last Rate  Last Admin   0.9 %  sodium chloride  infusion   Intravenous Continuous Tracy Gerken, MD   Stopped at 04/17/24 1207   sodium chloride  flush (NS) 0.9 % injection 10 mL  10 mL Intracatheter PRN Corban Kistler, MD   10 mL at 04/17/24 1201     VITALS:   Blood pressure (!) 94/55, pulse 75, temperature 98 F (36.7 C), temperature source Temporal, resp. rate 16, weight 193 lb 12.8 oz (87.9 kg), SpO2 97%.  Wt Readings from Last 3 Encounters:  04/17/24 193 lb 12.8 oz (87.9 kg)  04/10/24 190 lb 11.2 oz (86.5 kg)  04/09/24 193 lb 2 oz (87.6 kg)    Body mass index is 26.28 kg/m.     Onc Performance Status - 04/17/24 0842       ECOG Perf Status   ECOG Perf Status Ambulatory and capable of all selfcare but unable to carry out any work activities.  Up and about more than 50% of waking hours      KPS SCALE   KPS % SCORE Cares for self, unable to carry on normal activity or to do active work          PHYSICAL EXAM:   Physical Exam Constitutional:      General: He is not in acute distress.    Appearance: Normal appearance.  HENT:     Head: Normocephalic and atraumatic.     Mouth/Throat:     Comments:  ~ 3-4 cm exophytic lesion involving the entire right tonsillar fossa, with palate involvement, reaching the uvula but not crossing midline. Eyes:     Conjunctiva/sclera: Conjunctivae normal.  Cardiovascular:     Rate and Rhythm: Normal rate and regular rhythm.  Pulmonary:     Effort: Pulmonary effort  is normal. No respiratory distress.  Chest:     Comments: Port-A-Cath in place without any signs of infection Abdominal:     General: There is no distension.     Comments: Feeding tube in place.  Mild erythema around the site of insertion, within expected range.  No signs of infection.  Lymphadenopathy:     Cervical: Cervical adenopathy (right sided, soft, ~ 6 cm) present.  Neurological:     General: No focal deficit present.     Mental Status: He is alert and oriented to person, place, and time.  Psychiatric:        Mood and Affect: Mood normal.        Behavior: Behavior normal.       LABORATORY DATA:   I have reviewed the data as listed.  Results for orders placed or performed in visit on 04/17/24  Rad Onc Aria Session Summary  Result Value Ref Range   Course ID [id number]    Course Start Date 03/26/2024    Session Number 6    Course First Treatment Date 04/10/2024  5:34 PM    Course Last Treatment Date 04/17/2024  1:19 PM    Course Elapsed Days 7    Reference Point ID HN_R_Tonsil DP    Reference Point Dosage Given to Date 12 Gy   Reference Point Session Dosage Given 2 Gy   Plan ID HN_R_Tonsil    Plan Name HN_R_Tonsil    Plan Fractions Treated to Date 6    Plan Total Fractions Prescribed 35    Plan Prescribed Dose Per Fraction 2 Gy   Plan Total Prescribed Dose 70.000000 Gy   Plan Primary Reference Point HN_R_Tonsil DP   Results for orders placed  or performed in visit on 04/17/24  CMP (Cancer Center only)  Result Value Ref Range   Sodium 138 135 - 145 mmol/L   Potassium 5.1 3.5 - 5.1 mmol/L   Chloride 106 98 - 111 mmol/L   CO2 27 22 - 32 mmol/L   Glucose, Bld 223 (H) 70 - 99 mg/dL   BUN 61 (H) 8 - 23 mg/dL   Creatinine 8.49 (H) 9.38 - 1.24 mg/dL   Calcium  9.1 8.9 - 10.3 mg/dL   Total Protein 6.4 (L) 6.5 - 8.1 g/dL   Albumin  3.5 3.5 - 5.0 g/dL   AST 32 15 - 41 U/L   ALT 46 (H) 0 - 44 U/L   Alkaline Phosphatase 55 38 - 126 U/L   Total Bilirubin 0.9 0.0 - 1.2 mg/dL    GFR, Estimated 48 (L) >60 mL/min   Anion gap 5 5 - 15  CBC with Differential (Cancer Center Only)  Result Value Ref Range   WBC Count 5.6 4.0 - 10.5 K/uL   RBC 4.76 4.22 - 5.81 MIL/uL   Hemoglobin 14.7 13.0 - 17.0 g/dL   HCT 56.8 60.9 - 47.9 %   MCV 90.5 80.0 - 100.0 fL   MCH 30.9 26.0 - 34.0 pg   MCHC 34.1 30.0 - 36.0 g/dL   RDW 87.3 88.4 - 84.4 %   Platelet Count 108 (L) 150 - 400 K/uL   nRBC 0.0 0.0 - 0.2 %   Neutrophils Relative % 81 %   Neutro Abs 4.5 1.7 - 7.7 K/uL   Lymphocytes Relative 11 %   Lymphs Abs 0.6 (L) 0.7 - 4.0 K/uL   Monocytes Relative 3 %   Monocytes Absolute 0.2 0.1 - 1.0 K/uL   Eosinophils Relative 4 %   Eosinophils Absolute 0.2 0.0 - 0.5 K/uL   Basophils Relative 0 %   Basophils Absolute 0.0 0.0 - 0.1 K/uL   Immature Granulocytes 1 %   Abs Immature Granulocytes 0.04 0.00 - 0.07 K/uL     RADIOGRAPHIC STUDIES:  I have personally reviewed the radiological images as listed and agree with the findings in the report.  IR GASTROSTOMY TUBE MOD SED Result Date: 04/09/2024 INDICATION: he will receive chemo/radiation for head and neck cancer EXAM: Procedures: 1. FLUOROSCOPIC NASOGASTRIC TUBE PLACEMENT 2. PERCUTANEOUS GASTROSTOMY FEEDING TUBE PLACEMENT COMPARISON:  PET-CT, 02/28/2024.  CT chest, 02/18/2024. MEDICATIONS: Ancef  2 gm IV; Antibiotics were administered within 1 hour of the procedure. 1 mg glucagon  IV. CONTRAST:  20 mL of Isovue  300 administered into the gastric lumen. ANESTHESIA/SEDATION: Moderate (conscious) sedation was employed during this procedure. A total of Versed  1 mg and Fentanyl  50 mcg was administered intravenously. Moderate Sedation Time: 13 minutes. The patient's level of consciousness and vital signs were monitored continuously by radiology nursing throughout the procedure under my direct supervision. FLUOROSCOPY: Radiation Exposure Index and estimated peak skin dose (PSD); Reference air kerma (RAK), 6 mGy. COMPLICATIONS: None immediate.  PROCEDURE: Informed written consent was obtained from the patient and/or patient's representative following explanation of the procedure, risks, benefits and alternatives. A time out was performed prior to the initiation of the procedure. Maximal barrier sterile technique utilized including caps, mask, sterile gowns, sterile gloves, large sterile drape, hand hygiene and sterile prep. The LEFT upper quadrant was sterilely prepped and draped. An oral gastric catheter was inserted into the stomach under fluoroscopy. The LEFT costal margin and barium opacified transverse colon were identified and avoided. Air was injected into the stomach for insufflation and visualization  under fluoroscopy. Under sterile conditions and local anesthesia, 2 T tacks were utilized to pexy the anterior aspect of the stomach against the ventral abdominal wall. Contrast injection confirmed appropriate positioning of each of the T tacks. An incision was made between the T tacks and a 17 gauge trocar needle was utilized to access the stomach. Needle position was confirmed within the stomach with aspiration of air and injection of a small amount of contrast. A stiff guidewire was advanced into the gastric lumen and under intermittent fluoroscopic guidance, the access needle was exchanged for a telescoping peel-away sheath, ultimately allowing placement of a 16 Fr balloon retention gastrostomy tube. The retention balloon was insufflated with a mixture of dilute saline and contrast and pulled taut against the anterior wall of the stomach. The external disc was cinched. Contrast injection confirms positioning within the stomach. Several spot radiographic images were obtained in various obliquities for documentation. The patient tolerated procedure well without immediate post procedural complication. FINDINGS: After successful fluoroscopic guided placement, the gastrostomy tube is appropriately positioned with internal retention balloon against the  ventral aspect of the gastric lumen. IMPRESSION: Successful fluoroscopic insertion of a 16 Fr balloon retention gastrostomy tube. The gastrostomy may be used immediately for medication administration and in 4 hrs for the initiation of feeds. RECOMMENDATIONS: The patient will return to [facility]iology (VIR) for routine feeding tube evaluation and exchange in 6 months. Thom Hall, MD [facility] Rad[facility] Electronically Signed   By: Thom Hall M.D.   On: 04/09/2024 16:10   IR IMAGING GUIDED PORT INSERTION Result Date: 04/09/2024 INDICATION: he will receive chemo for head and neck cancer EXAM: IMPLANTED PORT A CATH PLACEMENT WITH ULTRASOUND AND FLUOROSCOPIC GUIDANCE MEDICATIONS: None ANESTHESIA/SEDATION: Moderate (conscious) sedation was employed during this procedure. A total of Versed  1 mg and Fentanyl  50 mcg was administered intravenously. Moderate Sedation Time: 21 minutes. The patient's level of consciousness and vital signs were monitored continuously by radiology nursing throughout the procedure under my direct supervision. FLUOROSCOPY: Radiation Exposure Index and estimated peak skin dose (PSD); Reference air kerma (RAK), 1 mGy. COMPLICATIONS: None immediate. PROCEDURE: The procedure, risks, benefits, and alternatives were explained to the patient. Questions regarding the procedure were encouraged and answered. The patient understands and consents to the procedure. The neck and chest were prepped with chlorhexidine  in a sterile fashion, and a sterile drape was applied covering the operative field. Maximum barrier sterile technique with sterile gowns and gloves were used for the procedure. A timeout was performed prior to the initiation of the procedure. Local anesthesia was provided with 1% lidocaine  with epinephrine . After creating a small venotomy incision, a micropuncture kit was utilized to access the internal jugular vein under direct,  real-time ultrasound guidance. Ultrasound image documentation was performed. The microwire was kinked to measure appropriate catheter length. A subcutaneous port pocket was then created along the upper chest wall utilizing a combination of sharp and blunt dissection. The pocket was irrigated with sterile saline. A single lumen power injectable port was chosen for placement. The 8 Fr catheter was tunneled from the port pocket site to the venotomy incision. The port was placed in the pocket. The external catheter was trimmed to appropriate length. At the venotomy, an 8 Fr peel-away sheath was placed over a guidewire under fluoroscopic guidance. The catheter was then placed through the sheath and the sheath was removed. Final catheter positioning was confirmed and documented with a fluoroscopic spot radiograph. The port was accessed with  a Huber needle, aspirated and flushed with heparinized saline. The port pocket incision was closed with interrupted 3-0 Vicryl suture then Dermabond was applied, including at the venotomy incision. Dressings were placed. The patient tolerated the procedure well without immediate post procedural complication. IMPRESSION: Successful placement of a RIGHT internal jugular approach power injectable Port-A-Cath. The tip of the catheter is positioned within the proximal RIGHT atrium. The catheter is ready for immediate use. Thom Hall, MD [facility] Radiology Electronically Signed   By: Thom Hall M.D.   On: 04/09/2024 16:05     CODE STATUS:  Code Status History     Date Active Date Inactive Code Status Order ID Comments User Context   04/09/2024 1259 04/10/2024 0512 Full Code [id number]  Hall Thom, MD HOV   04/09/2024 1259 04/09/2024 1259 Full Code [id number]  Hall Thom, MD HOV   02/18/2024 1202 02/19/2024 2007 Do not attempt resuscitation (DNR) PRE-ARREST INTERVENTIONS DESIRED [id number]  Alba Sharper, MD ED   01/08/2020 1342  01/08/2020 1958 Full Code [id number]  Rolan Ezra RAMAN, MD Inpatient   08/20/2017 1404 08/23/2017 1652 Full Code [id number]  Ethyl Lenis, MD Inpatient   05/11/2017 1020 05/11/2017 1929 Full Code [id number]  Inocencio Soyla Lunger, MD Inpatient   12/15/2016 0851 12/19/2016 1357 Full Code [id number]  Johnson Laymon HERO, PA Inpatient   07/11/2016 1713 08/01/2016 1629 Full Code [id number]  Dusty Sudie DEL, MD Inpatient   06/21/2016 1332 06/21/2016 1935 Full Code [id number]  Wonda Sharper, MD Inpatient    Questions for Most Recent Historical Code Status (Order [id number])     Question Answer   By: Consent: discussion documented in EHR            No orders of the defined types were placed in this encounter.    Future Appointments  Date Time Provider Department Center  04/18/2024  1:45 PM [facility] [id number] [facility] None  04/21/2024  9:45 AM [facility] [id number] [facility] None  04/22/2024  8:10 AM [facility] HVT DEVICE REMOTES [facility] H&V  04/22/2024 10:45 AM [facility] [id number] [facility] None  04/23/2024  1:15 PM [facility] [id number] [facility] None  04/24/2024 10:15 AM [facility] FLUSH [facility] None  04/24/2024 10:45 AM [facility] [id number] [facility] None  04/24/2024 11:00 AM Boscia, Heather E, NP [facility] None  04/24/2024 11:45 AM [facility] INFUSION [facility] None  04/24/2024 12:00 PM Neff, Barbara L, RD [facility] None  04/25/2024 10:45 AM [facility] [id number] [facility] None  04/28/2024  7:10 AM [facility] HVT DEVICE REMOTES [facility] H&V  04/28/2024 10:45 AM [facility] [id number] [facility] None  04/29/2024 10:45 AM [facility] [id number] [facility] None  04/30/2024 10:45 AM [facility] [id number] [facility] None  05/01/2024 10:15 AM [facility] FLUSH [facility] None  05/01/2024 10:45 AM [facility] [id number] [facility] None  05/01/2024 11:00 AM Boscia, Heather E, NP [facility] None  05/01/2024 11:45 AM [facility] INFUSION [facility] None  05/01/2024 12:00 PM Neff, Barbara L, RD  [facility] None  05/02/2024 10:45 AM [facility] [id number] [facility] None  05/05/2024 10:45 AM [facility] [id number] [facility] None  05/06/2024 10:45 AM [facility] [id number] [facility] None  05/07/2024 10:45 AM [facility] [id number] [facility] None  05/08/2024 10:45 AM [facility] [id number] [facility] None  05/09/2024 10:45 AM [facility] [id number] [facility] None  05/12/2024 10:45 AM [facility] [id number] [facility] None  05/13/2024 10:45 AM [facility] [id number] [facility] None  05/14/2024 10:45 AM [facility] [id number] [facility] None  05/15/2024 10:45 AM [facility] [id number] [facility] None  05/16/2024 10:45 AM [facility] [id number] [facility] None  05/19/2024 10:45 AM [facility] [id number] [facility] None  05/20/2024 10:45 AM [facility]  [id number] [facility] None  05/21/2024 10:45 AM [facility] [id number] [facility] None  05/22/2024 10:45 AM [facility] [id number] [facility] None  05/23/2024  9:40 AM Croitoru, Mihai, MD [facility] H&V  05/23/2024 10:45 AM [facility] [id number] [facility] None  05/26/2024 10:45 AM [facility] [id number] [facility] None  05/27/2024 10:45 AM [facility] [id number] [facility] None  05/28/2024 10:45 AM [facility] [id number] [facility] None  06/18/2024 10:00 AM Breedlove Blue, Blaire L, PT [facility] None  07/22/2024  8:10 AM [facility] HVT DEVICE REMOTES [facility] H&V  09/10/2024  9:30 AM Nida, Ethelle ORN, MD [facility] None      This document was completed utilizing speech recognition software. Grammatical errors, random word insertions, pronoun errors, and incomplete sentences are an occasional consequence of this system due to software limitations, ambient noise, and hardware issues. Any formal questions or concerns about the content, text or information contained within the body of this dictation should be directly addressed to the provider for clarification.

## 2024-04-17 NOTE — Assessment & Plan Note (Signed)
 Chronic kidney disease with altered kidney function. Cisplatin is avoided due to nephrotoxicity risk. Carboplatin  is chosen as it poses less risk to kidney function. Regular monitoring of kidney function is necessary to adjust chemotherapy dosing and prevent further kidney damage.  -His baseline creatinine level is 1.6-1.8.  Today creatinine is 1.5. - Monitor kidney function regularly during chemotherapy. - Adjust chemotherapy dosing based on kidney function. - Avoid cisplatin due to nephrotoxicity risk.

## 2024-04-18 ENCOUNTER — Other Ambulatory Visit: Payer: Self-pay

## 2024-04-18 ENCOUNTER — Ambulatory Visit
Admission: RE | Admit: 2024-04-18 | Discharge: 2024-04-18 | Disposition: A | Discharge status: Home / Self Care | Source: Ambulatory Visit | Attending: Radiation Oncology | Admitting: Radiation Oncology

## 2024-04-18 DIAGNOSIS — Z510 Encounter for antineoplastic radiation therapy: Secondary | ICD-10-CM | POA: Diagnosis not present

## 2024-04-18 LAB — RAD ONC ARIA SESSION SUMMARY
Course Elapsed Days: 8
Plan Fractions Treated to Date: 7
Plan Prescribed Dose Per Fraction: 2 Gy
Plan Total Fractions Prescribed: 35
Plan Total Prescribed Dose: 70 Gy
Reference Point Dosage Given to Date: 14 Gy
Reference Point Session Dosage Given: 2 Gy
Session Number: 7

## 2024-04-21 ENCOUNTER — Ambulatory Visit
Admission: RE | Admit: 2024-04-21 | Discharge: 2024-04-21 | Disposition: A | Discharge status: Home / Self Care | Source: Ambulatory Visit | Attending: Radiation Oncology

## 2024-04-21 ENCOUNTER — Other Ambulatory Visit: Payer: Self-pay

## 2024-04-21 DIAGNOSIS — Z510 Encounter for antineoplastic radiation therapy: Secondary | ICD-10-CM | POA: Diagnosis not present

## 2024-04-21 LAB — RAD ONC ARIA SESSION SUMMARY
Course Elapsed Days: 11
Plan Fractions Treated to Date: 8
Plan Prescribed Dose Per Fraction: 2 Gy
Plan Total Fractions Prescribed: 35
Plan Total Prescribed Dose: 70 Gy
Reference Point Dosage Given to Date: 16 Gy
Reference Point Session Dosage Given: 2 Gy
Session Number: 8

## 2024-04-22 ENCOUNTER — Ambulatory Visit: Payer: Medicare Other

## 2024-04-22 ENCOUNTER — Other Ambulatory Visit: Payer: Self-pay

## 2024-04-22 ENCOUNTER — Ambulatory Visit
Admission: RE | Admit: 2024-04-22 | Discharge: 2024-04-22 | Discharge status: Home / Self Care | Source: Ambulatory Visit | Attending: Radiation Oncology

## 2024-04-22 DIAGNOSIS — Z510 Encounter for antineoplastic radiation therapy: Secondary | ICD-10-CM | POA: Diagnosis not present

## 2024-04-22 DIAGNOSIS — I5042 Chronic combined systolic (congestive) and diastolic (congestive) heart failure: Secondary | ICD-10-CM

## 2024-04-22 LAB — RAD ONC ARIA SESSION SUMMARY
Course Elapsed Days: 12
Plan Fractions Treated to Date: 9
Plan Prescribed Dose Per Fraction: 2 Gy
Plan Total Fractions Prescribed: 35
Plan Total Prescribed Dose: 70 Gy
Reference Point Dosage Given to Date: 18 Gy
Reference Point Session Dosage Given: 2 Gy
Session Number: 9

## 2024-04-23 ENCOUNTER — Inpatient Hospital Stay

## 2024-04-23 ENCOUNTER — Ambulatory Visit

## 2024-04-23 LAB — CUP PACEART REMOTE DEVICE CHECK
Battery Remaining Longevity: 98 mo
Battery Voltage: 2.99 V
Brady Statistic RV Percent Paced: 97.26 %
Date Time Interrogation Session: 20250721214341
HighPow Impedance: 53 Ohm
Implantable Lead Connection Status: 753985
Implantable Lead Connection Status: 753985
Implantable Lead Connection Status: 753985
Implantable Lead Implant Date: 20171024
Implantable Lead Implant Date: 20171024
Implantable Lead Implant Date: 20171024
Implantable Lead Location: 753858
Implantable Lead Location: 753859
Implantable Lead Location: 753860
Implantable Lead Model: 4598
Implantable Lead Model: 5076
Implantable Pulse Generator Implant Date: 20241127
Lead Channel Impedance Value: 1007 Ohm
Lead Channel Impedance Value: 1026 Ohm
Lead Channel Impedance Value: 342 Ohm
Lead Channel Impedance Value: 361 Ohm
Lead Channel Impedance Value: 418 Ohm
Lead Channel Impedance Value: 437 Ohm
Lead Channel Impedance Value: 513 Ohm
Lead Channel Impedance Value: 551 Ohm
Lead Channel Impedance Value: 551 Ohm
Lead Channel Impedance Value: 665 Ohm
Lead Channel Impedance Value: 893 Ohm
Lead Channel Impedance Value: 950 Ohm
Lead Channel Impedance Value: 950 Ohm
Lead Channel Pacing Threshold Amplitude: 0.5 V
Lead Channel Pacing Threshold Amplitude: 0.75 V
Lead Channel Pacing Threshold Pulse Width: 0.4 ms
Lead Channel Pacing Threshold Pulse Width: 1 ms
Lead Channel Sensing Intrinsic Amplitude: 0.9 mV
Lead Channel Sensing Intrinsic Amplitude: 7.6 mV
Lead Channel Setting Pacing Amplitude: 1.75 V
Lead Channel Setting Pacing Amplitude: 2 V
Lead Channel Setting Pacing Amplitude: 2 V
Lead Channel Setting Pacing Pulse Width: 0.4 ms
Lead Channel Setting Pacing Pulse Width: 1 ms
Lead Channel Setting Sensing Sensitivity: 0.3 mV
Zone Setting Status: 755011
Zone Setting Status: 755011
Zone Setting Status: 755011

## 2024-04-23 NOTE — Assessment & Plan Note (Addendum)
 Please review oncology history for additional details and timeline of events.   cT3, cN0, cM0, p16 positive disease, stage II. Patient had consultation with Dr. Floretta, otolaryngology at [facility] on 03/19/2024.  Given his cardiac history, he was felt to be a high risk candidate for surgery and hence chemoradiation was recommended. Given his CKD, he is not a candidate for cisplatin.  Hence plan made to proceed with weekly carboplatin  and paclitaxel  during the course of radiation. Monitoring of kidney function and blood counts is essential to adjust chemotherapy dosing and manage potential side effects such as nausea, vomiting, and fatigue. Anti-nausea medications are provided to manage these effects.  If further renal dysfunction is noted, chemotherapy will be adjusted or skipped.  He started concurrent chemoradiation with weekly carboplatin  and paclitaxel  from 04/10/2024. He has experience fatigue.  Labs today reveal creatinine of 1.39, closer to his baseline.  Platelet count was slightly decreased predating the chemotherapy initiation.  Platelet count is 97,000 today.  Plan is to continue chemotherapy as long as platelet count is above 50,000. Given fatigue symptoms, and to avoid further thrombocytopenia, we will dose reduce carboplatin  to AUC 1.5 and proceed with cycle 2 of chemotherapy today with carboplatin  and paclitaxel . He is established with dietitian, speech pathology and physical therapy for supportive care. RTC in 1 week for labs, office visit and continuation of chemotherapy.

## 2024-04-23 NOTE — Progress Notes (Signed)
 [facility] CSW Progress Note  Clinical Child psychotherapist contacted patient by phone to follow-up on psychosocial needs as patient continues treatment. Patient expressed physical effects experienced currently through treatment. Patient discussed sore throat and thoughts that he will have to use feeding tube soon. Patient voiced thoughts about treatment regime  Otherwise, patient denied any immediate needs or concerns.         Follow Up Plan:  CSW will follow up again by phone on 8/13  Lizbeth Sprague, LCSW Clinical Social Worker [facility]

## 2024-04-23 NOTE — Progress Notes (Signed)
 Patient Care Team: Francyne Headland, MD as PCP - General (Cardiology) Inocencio Soyla Lunger, MD as PCP - Electrophysiology (Cardiology) Rolan Ezra RAMAN, MD as PCP - Cardiology (Cardiology) Skeet Juliene SAUNDERS, DO as Consulting Physician (Neurology) Okey Burns, MD as Consulting Physician (Otolaryngology) Izell Domino, MD as Consulting Physician (Radiation Oncology) Autumn Millman, MD as Consulting Physician (Oncology) Malmfelt, Delon CROME, RN as Oncology Nurse Navigator  Clinic Day:  04/24/2024  Referring physician: Francyne Headland, MD  ASSESSMENT & PLAN:   Assessment & Plan: Primary cancer of tonsillar fossa [facility]) Please review oncology history for additional details and timeline of events.   cT3, cN0, cM0, p16 positive disease, stage II. Patient had consultation with Dr. Floretta, otolaryngology at [facility] on 03/19/2024.  Given his cardiac history, he was felt to be a high risk candidate for surgery and hence chemoradiation was recommended. Given his CKD, he is not a candidate for cisplatin.  Hence plan made to proceed with weekly carboplatin  and paclitaxel  during the course of radiation. Monitoring of kidney function and blood counts is essential to adjust chemotherapy dosing and manage potential side effects such as nausea, vomiting, and fatigue. Anti-nausea medications are provided to manage these effects.  If further renal dysfunction is noted, chemotherapy will be adjusted or skipped.  He started concurrent chemoradiation with weekly carboplatin  and paclitaxel  from 04/10/2024. He has experience fatigue.  Labs today reveal creatinine of 1.39, closer to his baseline.  Platelet count was slightly decreased predating the chemotherapy initiation.  Platelet count is 97,000 today.  Plan is to continue chemotherapy as long as platelet count is above 50,000. Given fatigue symptoms, and to avoid further thrombocytopenia, we will dose reduce carboplatin  to AUC 1.5 and proceed with cycle 2 of  chemotherapy today with carboplatin  and paclitaxel . He is established with dietitian, speech pathology and physical therapy for supportive care. RTC in 1 week for labs, office visit and continuation of chemotherapy.   Mild neutropenia and thrombocytopenia WBC 3.3 with normal ANC of 2.5.  Decreased platelets at 97.  Will continue to monitor blood counts very closely.  Per treatment parameters, patient is okay to proceed with chemotherapy if platelet count >50.  CKD Labs today show some improvement in kidney functions.  BUN is 43, down from 61.  Creatinine is 1.39, down from 1.5.  GFR is 53 up from 48.  Plan Labs reviewed. - Mild neutropenia with WBC 3.3.  Platelets continue to drop.  Now at 47. - CMP indicates improved renal functions. Her treatment parameters, patient labs and presentations are appropriate for treatment. Proceed with cycle 3 day 1 of carboplatin , dose reduced to AUC of 1.5, and paclitaxel . Labs/flush, follow-up, and next treatment as scheduled.  The patient understands the plans discussed today and is in agreement with them.  He knows to contact our office if he develops concerns prior to his next appointment.  I provided 25 minutes of face-to-face time during this encounter and > 50% was spent counseling as documented under my assessment and plan.    Powell FORBES Lessen, NP   CANCER CENTER [facility] - A DEPT OF [facility] [address] [place] [address] Dept: [phone] Dept Fax: [phone]   No orders of the defined types were placed in this encounter.     CHIEF COMPLAINT:  CC: Primary cancer of tonsillar fossa  Current Treatment: Weekly carboplatin  and paclitaxel  along with radiation  INTERVAL HISTORY:  Patty is here today for repeat  clinical assessment.  He last saw Dr. Autumn on 04/17/2024.  Today, he presents for cycle 3 day 1 carboplatin  and Taxol .  States he has been having some difficulty with  speech.  Has had some intermittent nausea without vomiting.  Has a sore throat.  Using lidocaine  gargles to help reduce pain.  This is most beneficial prior to eating.  He continues to be able to tolerate food by mouth.  He denies chest pain, chest pressure, or shortness of breath. He denies headaches or visual disturbances. He denies abdominal pain, nausea, vomiting, or changes in bowel or bladder habits.  He does have some constipation since starting chemotherapy.  Is taking MiraLAX  every day.  He denies fevers or chills.  His appetite is fair. His weight has decreased 2 pounds over last week.  I have reviewed the past medical history, past surgical history, social history and family history with the patient and they are unchanged from previous note.  ALLERGIES:  is allergic to xanax [alprazolam].  MEDICATIONS:  Current Outpatient Medications  Medication Sig Dispense Refill   acetaminophen  (TYLENOL ) 325 MG tablet Take 325 mg by mouth every 6 (six) hours as needed for mild pain (pain score 1-3).     apixaban  (ELIQUIS ) 5 MG TABS tablet Take 1 tablet (5 mg total) by mouth 2 (two) times daily. 60 tablet 6   Blood Glucose Monitoring Suppl (ACCU-CHEK GUIDE ME) w/Device KIT 1 Piece by Does not apply route as directed. 1 kit 0   carvedilol  (COREG ) 12.5 MG tablet TAKE 1 & 1/2 (ONE & ONE-HALF) TABLETS BY MOUTH TWICE DAILY WITH A MEAL 180 tablet 3   Cholecalciferol  (VITAMIN D ) 50 MCG (2000 UT) tablet Take 2,000 Units by mouth daily with breakfast.     dapagliflozin  propanediol (FARXIGA ) 10 MG TABS tablet Take 1 tablet (10 mg total) by mouth daily. 90 tablet 3   dexamethasone  (DECADRON ) 4 MG tablet Take 2 tablets daily for 2 days, start the day after chemotherapy. Take with food. 30 tablet 1   digoxin  (LANOXIN ) 0.125 MG tablet Take 1/2 (one-half) tablet by mouth once daily 45 tablet 3   eplerenone  (INSPRA ) 50 MG tablet Take 1 tablet (50 mg total) by mouth daily. 90 tablet 3   furosemide  (LASIX ) 20 MG tablet  Take 1 tablet (20 mg total) by mouth daily as needed (for a weight over 190 lbs). 30 tablet 11   glipiZIDE  (GLUCOTROL  XL) 5 MG 24 hr tablet Take 1 tablet (5 mg total) by mouth daily with breakfast. 90 tablet 1   glucose blood (ACCU-CHEK GUIDE TEST) test strip Use to test blood glucose daily as instructed 100 each 2   levothyroxine  (SYNTHROID ) 50 MCG tablet Take 1 tablet (50 mcg total) by mouth daily before breakfast. 90 tablet 1   lidocaine  (XYLOCAINE ) 2 % solution Patient: Mix 1part 2% viscous lidocaine , 1part H20. Swish & swallow 10mL of diluted mixture, 30min before meals and at bedtime, up to QID 200 mL 3   lidocaine -prilocaine  (EMLA ) cream Apply to affected area once 30 g 3   neomycin-bacitracin-polymyxin (NEOSPORIN) ointment Apply 1 application  topically daily as needed for wound care.     nitroGLYCERIN  (NITROSTAT ) 0.4 MG SL tablet DISSOLVE 1 TABLET UNDER THE TONGUE EVERY 5 MINUTES AS NEEDED FOR CHEST PAIN, DO NOT EXCEED 3 DOSES IN 15 MINUTES 25 tablet 1   ondansetron  (ZOFRAN ) 8 MG tablet Take 1 tablet (8 mg total) by mouth every 8 (eight) hours as needed for nausea or vomiting. Start  on the third day after chemotherapy. 30 tablet 1   polyethylene glycol (MIRALAX ) 17 g packet Take 17 g by mouth daily as needed for mild constipation or moderate constipation.     prochlorperazine  (COMPAZINE ) 10 MG tablet Take 1 tablet (10 mg total) by mouth every 6 (six) hours as needed for nausea or vomiting. 30 tablet 1   rosuvastatin  (CRESTOR ) 10 MG tablet Take 1 tablet (10 mg total) by mouth daily. 90 tablet 3   sacubitril -valsartan  (ENTRESTO ) 97-103 MG Take 1 tablet by mouth twice daily 180 tablet 3   sodium zirconium cyclosilicate  (LOKELMA ) 10 g PACK packet DISSOLVE 1 PACKET IN WATER & DRINK ONCE DAILY 90 each 3   Tetrahydrozoline HCl (VISINE OP) Place 1 drop into both eyes daily as needed (irritation).     No current facility-administered medications for this visit.    HISTORY OF PRESENT ILLNESS:    Oncology History  Primary cancer of tonsillar fossa (HCC)  03/04/2024 Initial Diagnosis   Primary cancer of tonsillar fossa (HCC)   03/04/2024 Cancer Staging   Staging form: Pharynx - HPV-Mediated Oropharynx, AJCC 8th Edition - Clinical stage from 03/04/2024: Stage II (cT3, cN0, cM0, p16+) - Signed by Autumn Millman, MD on 03/21/2024 Stage prefix: Initial diagnosis   04/10/2024 -  Chemotherapy   Patient is on Treatment Plan : HEAD/NECK Carboplatin  + Paclitaxel  + XRT q7d         REVIEW OF SYSTEMS:   Constitutional: Denies fevers, chills or abnormal weight loss Eyes: Denies blurriness of vision Ears, nose, mouth, throat, and face: Denies mucositis.  Does have sore throat making his difficulty swallowing.  Speech is difficult. Respiratory: Denies cough, dyspnea or wheezes Cardiovascular: Denies palpitation, chest discomfort or lower extremity swelling Gastrointestinal:  Denies nausea, heartburn or change in bowel habits.  Has noted constipation since starting on chemotherapy. Skin: Denies abnormal skin rashes Lymphatics: Denies new lymphadenopathy or easy bruising Neurological:Denies numbness, tingling or new weaknesses Behavioral/Psych: Mood is stable, no new changes  All other systems were reviewed with the patient and are negative.   VITALS:   Today's Vitals   04/24/24 1100 04/24/24 1113  BP:  100/64  Pulse:  70  Resp:  17  Temp:  97.9 F (36.6 C)  SpO2:  97%  Weight:  191 lb 14.4 oz (87 kg)  PainSc: 0-No pain    Body mass index is 26.03 kg/m.   Wt Readings from Last 3 Encounters:  04/24/24 191 lb 14.4 oz (87 kg)  04/17/24 193 lb 12.8 oz (87.9 kg)  04/10/24 190 lb 11.2 oz (86.5 kg)    Body mass index is 26.03 kg/m.  Performance status (ECOG): 1 - Symptomatic but completely ambulatory  PHYSICAL EXAM:   GENERAL:alert, no distress and comfortable SKIN: skin color, texture, turgor are normal, no rashes or significant lesions EYES: normal, Conjunctiva are pink and  non-injected, sclera clear OROPHARYNX:no exudate, no erythema and lips, buccal mucosa, and tongue normal  NECK: supple, thyroid  normal size, non-tender, without nodularity LYMPH:  no palpable lymphadenopathy in the cervical, axillary or inguinal LUNGS: clear to auscultation and percussion with normal breathing effort HEART: regular rate & rhythm and no murmurs and no lower extremity edema ABDOMEN:abdomen soft, non-tender and normal bowel sounds Musculoskeletal:no cyanosis of digits and no clubbing  NEURO: alert & oriented x 3 with fluent speech, no focal motor/sensory deficits  LABORATORY DATA:  I have reviewed the data as listed    Component Value Date/Time   NA 138 04/24/2024 1005  NA 140 08/21/2023 1307   K 4.6 04/24/2024 1005   CL 106 04/24/2024 1005   CO2 29 04/24/2024 1005   GLUCOSE 214 (H) 04/24/2024 1005   BUN 43 (H) 04/24/2024 1005   BUN 37 (H) 08/21/2023 1307   CREATININE 1.39 (H) 04/24/2024 1005   CALCIUM  8.6 (L) 04/24/2024 1005   PROT 6.0 (L) 04/24/2024 1005   PROT 6.8 08/21/2023 1306   ALBUMIN  3.3 (L) 04/24/2024 1005   ALBUMIN  4.3 08/21/2023 1306   AST 14 (L) 04/24/2024 1005   ALT 32 04/24/2024 1005   ALKPHOS 50 04/24/2024 1005   BILITOT 0.9 04/24/2024 1005   GFRNONAA 53 (L) 04/24/2024 1005   GFRAA 46 (L) 09/22/2020 0859    Lab Results  Component Value Date   WBC 3.3 (L) 04/24/2024   NEUTROABS 2.5 04/24/2024   HGB 14.0 04/24/2024   HCT 41.5 04/24/2024   MCV 91.6 04/24/2024   PLT 97 (L) 04/24/2024    RADIOGRAPHIC STUDIES: CUP PACEART REMOTE DEVICE CHECK Result Date: 04/23/2024 ICD Scheduled remote reviewed. Normal device function.  Presenting rhythm: AP-AS/BiVP with bigeminy PACs. 1 VT-NS classified episode on 03/13/24 at 00:37, 15 beats, NSVT at 220 bpm. Next remote 91 days. MC, CVRS  IR GASTROSTOMY TUBE MOD SED Result Date: 04/09/2024 INDICATION: he will receive chemo/radiation for head and neck cancer EXAM: Procedures: 1. FLUOROSCOPIC NASOGASTRIC TUBE  PLACEMENT 2. PERCUTANEOUS GASTROSTOMY FEEDING TUBE PLACEMENT COMPARISON:  PET-CT, 02/28/2024.  CT chest, 02/18/2024. MEDICATIONS: Ancef  2 gm IV; Antibiotics were administered within 1 hour of the procedure. 1 mg glucagon  IV. CONTRAST:  20 mL of Isovue  300 administered into the gastric lumen. ANESTHESIA/SEDATION: Moderate (conscious) sedation was employed during this procedure. A total of Versed  1 mg and Fentanyl  50 mcg was administered intravenously. Moderate Sedation Time: 13 minutes. The patient's level of consciousness and vital signs were monitored continuously by radiology nursing throughout the procedure under my direct supervision. FLUOROSCOPY: Radiation Exposure Index and estimated peak skin dose (PSD); Reference air kerma (RAK), 6 mGy. COMPLICATIONS: None immediate. PROCEDURE: Informed written consent was obtained from the patient and/or patient's representative following explanation of the procedure, risks, benefits and alternatives. A time out was performed prior to the initiation of the procedure. Maximal barrier sterile technique utilized including caps, mask, sterile gowns, sterile gloves, large sterile drape, hand hygiene and sterile prep. The LEFT upper quadrant was sterilely prepped and draped. An oral gastric catheter was inserted into the stomach under fluoroscopy. The LEFT costal margin and barium opacified transverse colon were identified and avoided. Air was injected into the stomach for insufflation and visualization under fluoroscopy. Under sterile conditions and local anesthesia, 2 T tacks were utilized to pexy the anterior aspect of the stomach against the ventral abdominal wall. Contrast injection confirmed appropriate positioning of each of the T tacks. An incision was made between the T tacks and a 17 gauge trocar needle was utilized to access the stomach. Needle position was confirmed within the stomach with aspiration of air and injection of a small amount of contrast. A stiff  guidewire was advanced into the gastric lumen and under intermittent fluoroscopic guidance, the access needle was exchanged for a telescoping peel-away sheath, ultimately allowing placement of a 16 Fr balloon retention gastrostomy tube. The retention balloon was insufflated with a mixture of dilute saline and contrast and pulled taut against the anterior wall of the stomach. The external disc was cinched. Contrast injection confirms positioning within the stomach. Several spot radiographic images were obtained in various obliquities  for documentation. The patient tolerated procedure well without immediate post procedural complication. FINDINGS: After successful fluoroscopic guided placement, the gastrostomy tube is appropriately positioned with internal retention balloon against the ventral aspect of the gastric lumen. IMPRESSION: Successful fluoroscopic insertion of a 16 Fr balloon retention gastrostomy tube. The gastrostomy may be used immediately for medication administration and in 4 hrs for the initiation of feeds. RECOMMENDATIONS: The patient will return to Vascular Interventional Radiology (VIR) for routine feeding tube evaluation and exchange in 6 months. Thom Hall, MD [facility] Radiology Electronically Signed   By: Thom Hall M.D.   On: 04/09/2024 16:10   IR IMAGING GUIDED PORT INSERTION Result Date: 04/09/2024 INDICATION: he will receive chemo for head and neck cancer EXAM: IMPLANTED PORT A CATH PLACEMENT WITH ULTRASOUND AND FLUOROSCOPIC GUIDANCE MEDICATIONS: None ANESTHESIA/SEDATION: Moderate (conscious) sedation was employed during this procedure. A total of Versed  1 mg and Fentanyl  50 mcg was administered intravenously. Moderate Sedation Time: 21 minutes. The patient's level of consciousness and vital signs were monitored continuously by radiology nursing throughout the procedure under my direct supervision. FLUOROSCOPY: Radiation Exposure Index  and estimated peak skin dose (PSD); Reference air kerma (RAK), 1 mGy. COMPLICATIONS: None immediate. PROCEDURE: The procedure, risks, benefits, and alternatives were explained to the patient. Questions regarding the procedure were encouraged and answered. The patient understands and consents to the procedure. The neck and chest were prepped with chlorhexidine  in a sterile fashion, and a sterile drape was applied covering the operative field. Maximum barrier sterile technique with sterile gowns and gloves were used for the procedure. A timeout was performed prior to the initiation of the procedure. Local anesthesia was provided with 1% lidocaine  with epinephrine . After creating a small venotomy incision, a micropuncture kit was utilized to access the internal jugular vein under direct, real-time ultrasound guidance. Ultrasound image documentation was performed. The microwire was kinked to measure appropriate catheter length. A subcutaneous port pocket was then created along the upper chest wall utilizing a combination of sharp and blunt dissection. The pocket was irrigated with sterile saline. A single lumen power injectable port was chosen for placement. The 8 Fr catheter was tunneled from the port pocket site to the venotomy incision. The port was placed in the pocket. The external catheter was trimmed to appropriate length. At the venotomy, an 8 Fr peel-away sheath was placed over a guidewire under fluoroscopic guidance. The catheter was then placed through the sheath and the sheath was removed. Final catheter positioning was confirmed and documented with a fluoroscopic spot radiograph. The port was accessed with a Huber needle, aspirated and flushed with heparinized saline. The port pocket incision was closed with interrupted 3-0 Vicryl suture then Dermabond was applied, including at the venotomy incision. Dressings were placed. The patient tolerated the procedure well without immediate post procedural  complication. IMPRESSION: Successful placement of a RIGHT internal jugular approach power injectable Port-A-Cath. The tip of the catheter is positioned within the proximal RIGHT atrium. The catheter is ready for immediate use. Thom Hall, MD [facility] Radiology Electronically Signed   By: Thom Hall M.D.   On: 04/09/2024 16:05

## 2024-04-24 ENCOUNTER — Ambulatory Visit
Admission: RE | Admit: 2024-04-24 | Discharge: 2024-04-24 | Disposition: A | Discharge status: Home / Self Care | Source: Ambulatory Visit | Attending: Radiation Oncology

## 2024-04-24 ENCOUNTER — Inpatient Hospital Stay

## 2024-04-24 ENCOUNTER — Telehealth: Payer: Self-pay | Admitting: Oncology

## 2024-04-24 ENCOUNTER — Inpatient Hospital Stay (HOSPITAL_BASED_OUTPATIENT_CLINIC_OR_DEPARTMENT_OTHER): Admitting: Nurse Practitioner

## 2024-04-24 ENCOUNTER — Other Ambulatory Visit

## 2024-04-24 ENCOUNTER — Inpatient Hospital Stay: Admitting: Nutrition

## 2024-04-24 ENCOUNTER — Other Ambulatory Visit: Payer: Self-pay

## 2024-04-24 VITALS — BP 100/64 | HR 70 | Temp 97.9°F | Resp 17 | Wt 191.9 lb

## 2024-04-24 DIAGNOSIS — C090 Malignant neoplasm of tonsillar fossa: Secondary | ICD-10-CM

## 2024-04-24 DIAGNOSIS — Z95828 Presence of other vascular implants and grafts: Secondary | ICD-10-CM

## 2024-04-24 DIAGNOSIS — Z510 Encounter for antineoplastic radiation therapy: Secondary | ICD-10-CM | POA: Diagnosis not present

## 2024-04-24 LAB — CBC WITH DIFFERENTIAL (CANCER CENTER ONLY)
Abs Immature Granulocytes: 0.02 K/uL (ref 0.00–0.07)
Basophils Absolute: 0 K/uL (ref 0.0–0.1)
Basophils Relative: 1 %
Eosinophils Absolute: 0 K/uL (ref 0.0–0.5)
Eosinophils Relative: 1 %
HCT: 41.5 % (ref 39.0–52.0)
Hemoglobin: 14 g/dL (ref 13.0–17.0)
Immature Granulocytes: 1 %
Lymphocytes Relative: 12 %
Lymphs Abs: 0.4 K/uL — ABNORMAL LOW (ref 0.7–4.0)
MCH: 30.9 pg (ref 26.0–34.0)
MCHC: 33.7 g/dL (ref 30.0–36.0)
MCV: 91.6 fL (ref 80.0–100.0)
Monocytes Absolute: 0.3 K/uL (ref 0.1–1.0)
Monocytes Relative: 8 %
Neutro Abs: 2.5 K/uL (ref 1.7–7.7)
Neutrophils Relative %: 77 %
Platelet Count: 97 K/uL — ABNORMAL LOW (ref 150–400)
RBC: 4.53 MIL/uL (ref 4.22–5.81)
RDW: 12.4 % (ref 11.5–15.5)
WBC Count: 3.3 K/uL — ABNORMAL LOW (ref 4.0–10.5)
nRBC: 0 % (ref 0.0–0.2)

## 2024-04-24 LAB — CMP (CANCER CENTER ONLY)
ALT: 32 U/L (ref 0–44)
AST: 14 U/L — ABNORMAL LOW (ref 15–41)
Albumin: 3.3 g/dL — ABNORMAL LOW (ref 3.5–5.0)
Alkaline Phosphatase: 50 U/L (ref 38–126)
Anion gap: 3 — ABNORMAL LOW (ref 5–15)
BUN: 43 mg/dL — ABNORMAL HIGH (ref 8–23)
CO2: 29 mmol/L (ref 22–32)
Calcium: 8.6 mg/dL — ABNORMAL LOW (ref 8.9–10.3)
Chloride: 106 mmol/L (ref 98–111)
Creatinine: 1.39 mg/dL — ABNORMAL HIGH (ref 0.61–1.24)
GFR, Estimated: 53 mL/min — ABNORMAL LOW (ref >60)
Glucose, Bld: 214 mg/dL — ABNORMAL HIGH (ref 70–99)
Potassium: 4.6 mmol/L (ref 3.5–5.1)
Sodium: 138 mmol/L (ref 135–145)
Total Bilirubin: 0.9 mg/dL (ref 0.0–1.2)
Total Protein: 6 g/dL — ABNORMAL LOW (ref 6.5–8.1)

## 2024-04-24 LAB — RAD ONC ARIA SESSION SUMMARY
Course Elapsed Days: 14
Plan Fractions Treated to Date: 10
Plan Prescribed Dose Per Fraction: 2 Gy
Plan Total Fractions Prescribed: 35
Plan Total Prescribed Dose: 70 Gy
Reference Point Dosage Given to Date: 20 Gy
Reference Point Session Dosage Given: 2 Gy
Session Number: 10

## 2024-04-24 MED ORDER — PALONOSETRON HCL INJECTION 0.25 MG/5ML
0.2500 mg | Freq: Once | INTRAVENOUS | Status: AC
  Administered 2024-04-24: 0.25 mg via INTRAVENOUS
  Filled 2024-04-24: qty 5

## 2024-04-24 MED ORDER — FAMOTIDINE IN NACL 20-0.9 MG/50ML-% IV SOLN
20.0000 mg | Freq: Once | INTRAVENOUS | Status: AC
  Administered 2024-04-24: 20 mg via INTRAVENOUS
  Filled 2024-04-24: qty 50

## 2024-04-24 MED ORDER — SODIUM CHLORIDE 0.9% FLUSH
10.0000 mL | Freq: Once | INTRAVENOUS | Status: AC
Start: 2024-04-24 — End: 2024-04-24
  Administered 2024-04-24: 10 mL

## 2024-04-24 MED ORDER — SODIUM CHLORIDE 0.9 % IV SOLN
45.0000 mg/m2 | Freq: Once | INTRAVENOUS | Status: AC
  Administered 2024-04-24: 96 mg via INTRAVENOUS
  Filled 2024-04-24: qty 16

## 2024-04-24 MED ORDER — DIPHENHYDRAMINE HCL 50 MG/ML IJ SOLN
25.0000 mg | Freq: Once | INTRAMUSCULAR | Status: AC
  Administered 2024-04-24: 25 mg via INTRAVENOUS
  Filled 2024-04-24: qty 1

## 2024-04-24 MED ORDER — DEXAMETHASONE SODIUM PHOSPHATE 10 MG/ML IJ SOLN
10.0000 mg | Freq: Once | INTRAMUSCULAR | Status: AC
  Administered 2024-04-24: 10 mg via INTRAVENOUS
  Filled 2024-04-24: qty 1

## 2024-04-24 MED ORDER — SODIUM CHLORIDE 0.9 % IV SOLN
121.5000 mg | Freq: Once | INTRAVENOUS | Status: AC
  Administered 2024-04-24: 120 mg via INTRAVENOUS
  Filled 2024-04-24: qty 12

## 2024-04-24 MED ORDER — SODIUM CHLORIDE 0.9% FLUSH
10.0000 mL | INTRAVENOUS | Status: DC | PRN
Start: 2024-04-24 — End: 2024-04-24
  Administered 2024-04-24: 10 mL

## 2024-04-24 MED ORDER — HEPARIN SOD (PORK) LOCK FLUSH 100 UNIT/ML IV SOLN
500.0000 [IU] | Freq: Once | INTRAVENOUS | Status: AC | PRN
  Administered 2024-04-24: 500 [IU]

## 2024-04-24 MED ORDER — SODIUM CHLORIDE 0.9 % IV SOLN
INTRAVENOUS | Status: DC
Start: 2024-04-24 — End: 2024-04-24

## 2024-04-24 NOTE — Patient Instructions (Signed)
 [facility] - A DEPT OF [facility]   Discharge Instructions: Thank you for choosing [facility] to provide your oncology and hematology care.   If you have a lab appointment with the Cancer Center, please go directly to the Cancer Center and check in at the registration area.   Wear comfortable clothing and clothing appropriate for easy access to any Portacath or PICC line.   We strive to give you quality time with your provider. You may need to reschedule your appointment if you arrive late (15 or more minutes).  Arriving late affects you and other patients whose appointments are after yours.  Also, if you miss three or more appointments without notifying the office, you may be dismissed from the clinic at the provider's discretion.      For prescription refill requests, have your pharmacy contact our office and allow 72 hours for refills to be completed.    Today you received the following chemotherapy and/or immunotherapy agents: Paclitaxel (Taxol) and Carboplatin       To help prevent nausea and vomiting after your treatment, we encourage you to take your nausea medication as directed.  BELOW ARE SYMPTOMS THAT SHOULD BE REPORTED IMMEDIATELY: *FEVER GREATER THAN 100.4 F (38 C) OR HIGHER *CHILLS OR SWEATING *NAUSEA AND VOMITING THAT IS NOT CONTROLLED WITH YOUR NAUSEA MEDICATION *UNUSUAL SHORTNESS OF BREATH *UNUSUAL BRUISING OR BLEEDING *URINARY PROBLEMS (pain or burning when urinating, or frequent urination) *BOWEL PROBLEMS (unusual diarrhea, constipation, pain near the anus) TENDERNESS IN MOUTH AND THROAT WITH OR WITHOUT PRESENCE OF ULCERS (sore throat, sores in mouth, or a toothache) UNUSUAL RASH, SWELLING OR PAIN  UNUSUAL VAGINAL DISCHARGE OR ITCHING   Items with * indicate a potential emergency and should be followed up as soon as possible or go to the Emergency Department if any problems should occur.  Please show the CHEMOTHERAPY  ALERT CARD or IMMUNOTHERAPY ALERT CARD at check-in to the Emergency Department and triage nurse.  Should you have questions after your visit or need to cancel or reschedule your appointment, please contact [facility]  Dept: [phone]  and follow the prompts.  Office hours are 8:00 a.m. to 4:30 p.m. Monday - Friday. Please note that voicemails left after 4:00 p.m. may not be returned until the following business day.  We are closed weekends and major holidays. You have access to a nurse at all times for urgent questions. Please call the main number to the clinic Dept: [phone] and follow the prompts.   For any non-urgent questions, you may also contact your provider using MyChart. We now offer e-Visits for anyone 27 and older to request care online for non-urgent symptoms. For details visit [email or web address].   Also download the MyChart app! Go to the app store, search "MyChart", open the app, select , and log in with your MyChart username and password.

## 2024-04-24 NOTE — Progress Notes (Signed)
 Nutrition follow-up completed with patient and wife during infusion for tonsil cancer.  Patient is receiving concurrent chemoradiation therapy with carboplatin  and paclitaxel  and is followed by Dr. Autumn and Dr. Izell.  Final radiation therapy is scheduled for August 27.  PEG: April 09, 2024  Weight:  191 pounds 14.4 ounces July 24 193 pounds 12.8 ounces July 17 194 pounds June 12 198 pounds 6.6 ounces May 19.  Labs include glucose 214, BUN 43, creatinine 1.39, albumin  3.3.  Patient reports good appetite.  States he is eating well.  It is taking him much longer to eat so expect he is not getting in as many calories.  He drinks 1 Ensure Plus daily.  He still complains of metallic taste.  He is flushing his feeding tube once a day and denies difficulty.  States his tube feeding site looks fine.  Nutrition diagnosis: Predicted sub-optimal energy intake has evolved into inadequate oral intake related to tonsil cancer and increased needs as evidenced by 4% weight loss over 2 months.  Intervention: Educated to begin 1 can Osmolite 1.5 via feeding tube with 60 mL of free water before and after bolus feeding.  This should be done between meals. Continue drinking at least 1 carton Ensure Plus every day. Strategies provided on making foods easier to swallow. Encouraged wife to allow patient a liberalized diet. Continue baking soda and salt water gargles.  Monitoring, evaluation, goals: Patient will tolerate adequate calories and protein to minimize further weight loss.  Next visit: Thursday, July 31 during infusion.  **Disclaimer: This note was dictated with voice recognition software. Similar sounding words can inadvertently be transcribed and this note may contain transcription errors which may not have been corrected upon publication of note.**

## 2024-04-24 NOTE — Telephone Encounter (Signed)
 I informed Christian Bowman that Christian Bowman is scheduled for his appointments on 8/7. Christian Bowman stated that she will see the appointments on MyChart.

## 2024-04-25 ENCOUNTER — Other Ambulatory Visit: Payer: Self-pay

## 2024-04-25 ENCOUNTER — Ambulatory Visit
Admission: RE | Admit: 2024-04-25 | Discharge: 2024-04-25 | Disposition: A | Discharge status: Home / Self Care | Source: Ambulatory Visit | Attending: Radiation Oncology | Admitting: Radiation Oncology

## 2024-04-25 DIAGNOSIS — Z510 Encounter for antineoplastic radiation therapy: Secondary | ICD-10-CM | POA: Diagnosis not present

## 2024-04-25 LAB — RAD ONC ARIA SESSION SUMMARY
Course Elapsed Days: 15
Plan Fractions Treated to Date: 11
Plan Prescribed Dose Per Fraction: 2 Gy
Plan Total Fractions Prescribed: 35
Plan Total Prescribed Dose: 70 Gy
Reference Point Dosage Given to Date: 22 Gy
Reference Point Session Dosage Given: 2 Gy
Session Number: 11

## 2024-04-27 ENCOUNTER — Encounter: Payer: Self-pay | Admitting: Oncology

## 2024-04-27 ENCOUNTER — Encounter: Payer: Self-pay | Admitting: Nurse Practitioner

## 2024-04-28 ENCOUNTER — Other Ambulatory Visit: Payer: Self-pay | Admitting: Radiation Oncology

## 2024-04-28 ENCOUNTER — Ambulatory Visit: Attending: Cardiovascular Disease

## 2024-04-28 ENCOUNTER — Ambulatory Visit
Admission: RE | Admit: 2024-04-28 | Discharge: 2024-04-28 | Disposition: A | Discharge status: Home / Self Care | Source: Ambulatory Visit | Attending: Radiation Oncology | Admitting: Radiation Oncology

## 2024-04-28 ENCOUNTER — Other Ambulatory Visit: Payer: Self-pay

## 2024-04-28 ENCOUNTER — Ambulatory Visit

## 2024-04-28 ENCOUNTER — Other Ambulatory Visit: Payer: Self-pay | Admitting: Nurse Practitioner

## 2024-04-28 ENCOUNTER — Ambulatory Visit: Payer: Self-pay | Admitting: Cardiology

## 2024-04-28 DIAGNOSIS — I5042 Chronic combined systolic (congestive) and diastolic (congestive) heart failure: Secondary | ICD-10-CM

## 2024-04-28 DIAGNOSIS — Z95810 Presence of automatic (implantable) cardiac defibrillator: Secondary | ICD-10-CM

## 2024-04-28 DIAGNOSIS — C090 Malignant neoplasm of tonsillar fossa: Secondary | ICD-10-CM

## 2024-04-28 DIAGNOSIS — Z510 Encounter for antineoplastic radiation therapy: Secondary | ICD-10-CM | POA: Diagnosis not present

## 2024-04-28 LAB — RAD ONC ARIA SESSION SUMMARY
Course Elapsed Days: 18
Plan Fractions Treated to Date: 12
Plan Prescribed Dose Per Fraction: 2 Gy
Plan Total Fractions Prescribed: 35
Plan Total Prescribed Dose: 70 Gy
Reference Point Dosage Given to Date: 24 Gy
Reference Point Session Dosage Given: 2 Gy
Session Number: 12

## 2024-04-28 MED ORDER — OXYCODONE HCL 5 MG/5ML PO SOLN: 5.0000 mg | Freq: Four times a day (QID) | ORAL | 0 refills | Status: DC | PRN

## 2024-04-28 MED ORDER — OSMOLITE 1.5 CAL PO LIQD: ORAL | Status: AC

## 2024-04-28 MED ORDER — FLUCONAZOLE 10 MG/ML PO SUSR: ORAL | 0 refills | Status: AC

## 2024-04-28 NOTE — Addendum Note (Signed)
 Addended by: DASIE SIMPLE B on: 04/28/2024 03:26 PM   Modules accepted: Orders

## 2024-04-28 NOTE — Progress Notes (Addendum)
 Nutrition Follow-up:  Add on to RD schedule today due to swallowing difficulties and ability to take nutrition orally.  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  PEG placed on 7/9.   Met with patient and wife following radiation visit.  Reports that since yesterday unable to swallow solids or much liquids orally (unable to drink ensure plus this am) due to pain from radiation.  Unable to swallow medications orally at this time.  Up until yesterday was eating and drinking by mouth and taking 1 carton of osmolite 1.5 via feeding tube without difficulty.    Anticipate long term need of feeding tube.   Medications: liquid oxycodone  prescribed today and diflucan  lidocaine   Labs: reviewed from 7/24  Anthropometrics:   Weight per [facility] 187 lb 191 lb on 7/24 193 lb 12.8 oz on 7/17 194 lb on 6/12  4% weight loss in the last 4 days,concerning   Estimated Energy Needs  Kcals: 2125-2500 (25-30 kcals/kg) Protein: 102-127 g (1.2-1.5 g/kg) Fluid: > 2125 ml  NUTRITION DIAGNOSIS: Predicted sub optimal energy intake continues   INTERVENTION:  Patient unable to take in adequate solid or liquids by mouth at this time due to dysphagia and side effects from radiation treatment.   Recommend osmolite 1.5 (or equivalent) bolus, 7 cartons per day goal rate divided in 4 feedings (2 cartons at 8am, 12, 4pm and 1 carton at 8pm).  Flush with 60ml before and after each feeding.  Additional water flush of 240ml TID will be needed to better meet hydration needs At goal rate will provide 2485 calories, 104 g protein and 2460 ml free water. Meets 100% of needs Patient will start with 3 cartons of osmolite 1.5 today and additional water via feeding tube.  Tomorrow will increase to 4 cartons of osmolite 1.5 (1 carton at 8am, noon, 4 pm and 8pm).  Flush with 60 ml of water before and after. Will add 240 ml QID in between feedings for better hydration.  Further titration of tube  feeding to goal rate will be discussed at next follow-up on 7/30 by RD Will contact [employer] for assistance with enteral formula and supplies.    MONITORING, EVALUATION, GOAL: weight trends, tube feeding    NEXT VISIT: Thursday, July 30 during infusion  Lamin Chandley B. Dasie SOLON, CSO, LDN Registered Dietitian [phone]

## 2024-04-29 ENCOUNTER — Ambulatory Visit

## 2024-04-29 ENCOUNTER — Ambulatory Visit: Admitting: Nutrition

## 2024-04-29 ENCOUNTER — Encounter: Payer: Self-pay | Admitting: Cardiovascular Disease

## 2024-04-29 NOTE — Progress Notes (Signed)
 Patient's wife called this RD with questions regarding tube feeding.  Patient administered 1 carton Osmolite 1.5 yesterday at 2 separate feedings.  He took a few sips of Ensure.  He reported 2 loose stools last evening.  This morning patient gave 1 carton of Osmolite 1.5 at 7:30 AM with 60 mL of free water before and after his bolus feeding.  He administered 1 cup of water via PEG at 11 AM.  He gave 1 carton Osmolite 1.5 with 60 mL of free water before and after at 12:30 PM today.  Reports having diarrhea approximately 1 hour later.  Patient canceled treatment today because he was afraid to be traveling in the car while experiencing diarrhea.  Wife also reports she has been giving various medications at 5 PM and 8 PM with 60 mL of free water before and after meds.  No intolerance to medications noted.  She mention there were 2 medications that did not crush well.  1 was an extended release capsule but she could not remember the other.  She was going to the pharmacy to discuss with the pharmacist.  Educated to give 1 carton of Osmolite 1.5 slowly over 20 minutes at room temperature with 60 mL of free water before and after bolus feeding.  Assess tolerance.  If diarrhea occurs, okay to use Imodium after loose stool.  Will check with patient and wife tomorrow to see how patient tolerated formula the rest of today and in the morning.  **Disclaimer: This note was dictated with voice recognition software. Similar sounding words can inadvertently be transcribed and this note may contain transcription errors which may not have been corrected upon publication of note.**

## 2024-04-30 ENCOUNTER — Other Ambulatory Visit: Payer: Self-pay | Admitting: "Endocrinology

## 2024-04-30 ENCOUNTER — Telehealth: Payer: Self-pay

## 2024-04-30 ENCOUNTER — Ambulatory Visit: Admitting: Nutrition

## 2024-04-30 ENCOUNTER — Ambulatory Visit
Admission: RE | Admit: 2024-04-30 | Discharge: 2024-04-30 | Disposition: A | Discharge status: Home / Self Care | Source: Ambulatory Visit | Attending: Radiation Oncology | Admitting: Radiation Oncology

## 2024-04-30 ENCOUNTER — Other Ambulatory Visit: Payer: Self-pay

## 2024-04-30 DIAGNOSIS — Z510 Encounter for antineoplastic radiation therapy: Secondary | ICD-10-CM | POA: Diagnosis not present

## 2024-04-30 LAB — RAD ONC ARIA SESSION SUMMARY
Course Elapsed Days: 20
Plan Fractions Treated to Date: 13
Plan Prescribed Dose Per Fraction: 2 Gy
Plan Total Fractions Prescribed: 35
Plan Total Prescribed Dose: 70 Gy
Reference Point Dosage Given to Date: 26 Gy
Reference Point Session Dosage Given: 2 Gy
Session Number: 13

## 2024-04-30 MED ORDER — GLIPIZIDE 5 MG PO TABS: 5.0000 mg | ORAL_TABLET | Freq: Every day | ORAL | 1 refills | Status: AC

## 2024-04-30 NOTE — Progress Notes (Signed)
 Patient Care Team: Francyne Headland, MD as PCP - General (Cardiology) Inocencio Soyla Lunger, MD as PCP - Electrophysiology (Cardiology) Rolan Ezra RAMAN, MD as PCP - Cardiology (Cardiology) Skeet Juliene SAUNDERS, DO as Consulting Physician (Neurology) Okey Burns, MD as Consulting Physician (Otolaryngology) Izell Domino, MD as Consulting Physician (Radiation Oncology) Autumn Millman, MD as Consulting Physician (Oncology) Malmfelt, Delon CROME, RN as Oncology Nurse Navigator  Clinic Day:  05/01/2024  Referring physician: Francyne Headland, MD  ASSESSMENT & PLAN:   Assessment & Plan: Primary cancer of tonsillar fossa [facility]) Please review oncology history for additional details and timeline of events.   cT3, cN0, cM0, p16 positive disease, stage II. Patient had consultation with Dr. Floretta, otolaryngology at [facility] on 03/19/2024.  Given his cardiac history, he was felt to be a high risk candidate for surgery and hence chemoradiation was recommended. Given his CKD, he is not a candidate for cisplatin.  Hence plan made to proceed with weekly carboplatin  and paclitaxel  during the course of radiation. Monitoring of kidney function and blood counts is essential to adjust chemotherapy dosing and manage potential side effects such as nausea, vomiting, and fatigue. Anti-nausea medications are provided to manage these effects.  If further renal dysfunction is noted, chemotherapy will be adjusted or skipped.  He started concurrent chemoradiation with weekly carboplatin  and paclitaxel  from 04/10/2024. He has experience fatigue.  Labs today reveal creatinine of 1.39, closer to his baseline.  Platelet count was slightly decreased predating the chemotherapy initiation.  Platelet count is 97,000 today.  Plan is to continue chemotherapy as long as platelet count is above 50,000. Given fatigue symptoms, and to avoid further thrombocytopenia, we will dose reduce carboplatin  to AUC 1.5 and proceed with cycle 2 of  chemotherapy today with carboplatin  and paclitaxel . He is established with dietitian, speech pathology and physical therapy for supportive care. RTC in 1 week for labs, office visit and continuation of chemotherapy.    The patient understands the plans discussed today and is in agreement with them.  He knows to contact our office if he develops concerns prior to his next appointment.  I provided *** minutes of face-to-face time during this encounter and > 50% was spent counseling as documented under my assessment and plan.    Powell FORBES Lessen, NP  [facility] - A DEPT OF [facility] [address] [place] [address] Dept: [phone] Dept Fax: [phone]   No orders of the defined types were placed in this encounter.     CHIEF COMPLAINT:  CC: Primary cancer of tonsillar fossa  Current Treatment: Weekly carboplatin  and paclitaxel  along with radiation  INTERVAL HISTORY:  Galan is here today for repeat clinical assessment.  He last saw me on 04/24/2024.  He has since developed increased pain and difficulty swallowing.  Using his feeding tube more frequently.  He denies fevers or chills. He denies pain. His appetite is good. His weight {Weight change:10426}.  I have reviewed the past medical history, past surgical history, social history and family history with the patient and they are unchanged from previous note.  ALLERGIES:  is allergic to xanax [alprazolam].  MEDICATIONS:  Current Outpatient Medications  Medication Sig Dispense Refill   acetaminophen  (TYLENOL ) 325 MG tablet Take 325 mg by mouth every 6 (six) hours as needed for mild pain (pain score 1-3).     apixaban  (ELIQUIS ) 5 MG TABS tablet Take 1 tablet (5 mg total) by mouth  2 (two) times daily. 60 tablet 6   Blood Glucose Monitoring Suppl (ACCU-CHEK GUIDE ME) w/Device KIT 1 Piece by Does not apply route as directed. 1 kit 0   carvedilol  (COREG ) 12.5 MG  tablet TAKE 1 & 1/2 (ONE & ONE-HALF) TABLETS BY MOUTH TWICE DAILY WITH A MEAL 180 tablet 3   Cholecalciferol  (VITAMIN D ) 50 MCG (2000 UT) tablet Take 2,000 Units by mouth daily with breakfast.     dapagliflozin  propanediol (FARXIGA ) 10 MG TABS tablet Take 1 tablet (10 mg total) by mouth daily. 90 tablet 3   dexamethasone  (DECADRON ) 4 MG tablet Take 2 tablets daily for 2 days, start the day after chemotherapy. Take with food. 30 tablet 1   digoxin  (LANOXIN ) 0.125 MG tablet Take 1/2 (one-half) tablet by mouth once daily 45 tablet 3   eplerenone  (INSPRA ) 50 MG tablet Take 1 tablet (50 mg total) by mouth daily. 90 tablet 3   fluconazole  (DIFLUCAN ) 10 MG/ML suspension Take 20mL per tube today, then 10mL per tube daily for 20 more days. HOLD ROSUVASTATIN  WHILE ON THIS. 220 mL 0   furosemide  (LASIX ) 20 MG tablet Take 1 tablet (20 mg total) by mouth daily as needed (for a weight over 190 lbs). 30 tablet 11   glipiZIDE  (GLUCOTROL ) 5 MG tablet Take 1 tablet (5 mg total) by mouth daily before breakfast. Only one time daily at breakfast. 90 tablet 1   glucose blood (ACCU-CHEK GUIDE TEST) test strip Use to test blood glucose daily as instructed 100 each 2   levothyroxine  (SYNTHROID ) 50 MCG tablet Take 1 tablet (50 mcg total) by mouth daily before breakfast. 90 tablet 1   lidocaine  (XYLOCAINE ) 2 % solution Patient: Mix 1part 2% viscous lidocaine , 1part H20. Swish & swallow 10mL of diluted mixture, 30min before meals and at bedtime, up to QID 200 mL 3   lidocaine -prilocaine  (EMLA ) cream Apply to affected area once 30 g 3   neomycin-bacitracin-polymyxin (NEOSPORIN) ointment Apply 1 application  topically daily as needed for wound care.     nitroGLYCERIN  (NITROSTAT ) 0.4 MG SL tablet DISSOLVE 1 TABLET UNDER THE TONGUE EVERY 5 MINUTES AS NEEDED FOR CHEST PAIN, DO NOT EXCEED 3 DOSES IN 15 MINUTES 25 tablet 1   Nutritional Supplements (FEEDING SUPPLEMENT, OSMOLITE 1.5 CAL,) LIQD Give 2 cartons at 8am, noon, 4pm bolus  syringe and 1 carton at 8pm.  Flush with 60ml of water before and after each feeding.  Give additional 240ml TID between feedings for additional hydration.     ondansetron  (ZOFRAN ) 8 MG tablet Take 1 tablet (8 mg total) by mouth every 8 (eight) hours as needed for nausea or vomiting. Start on the third day after chemotherapy. 30 tablet 1   oxyCODONE  (ROXICODONE ) 5 MG/5ML solution Place 5 mLs (5 mg total) into feeding tube every 6 (six) hours as needed for severe pain (pain score 7-10). 450 mL 0   polyethylene glycol (MIRALAX ) 17 g packet Take 17 g by mouth daily as needed for mild constipation or moderate constipation.     prochlorperazine  (COMPAZINE ) 10 MG tablet Take 1 tablet (10 mg total) by mouth every 6 (six) hours as needed for nausea or vomiting. 30 tablet 1   rosuvastatin  (CRESTOR ) 10 MG tablet Take 1 tablet (10 mg total) by mouth daily. 90 tablet 3   sacubitril -valsartan  (ENTRESTO ) 97-103 MG Take 1 tablet by mouth twice daily 180 tablet 3   sodium zirconium cyclosilicate  (LOKELMA ) 10 g PACK packet DISSOLVE 1 PACKET IN WATER & DRINK ONCE  DAILY 90 each 3   Tetrahydrozoline HCl (VISINE OP) Place 1 drop into both eyes daily as needed (irritation).     No current facility-administered medications for this visit.   Facility-Administered Medications Ordered in Other Visits  Medication Dose Route Frequency Provider Last Rate Last Admin   0.9 %  sodium chloride  infusion   Intravenous Continuous Pasam, Avinash, MD 10 mL/hr at 05/01/24 1321 New Bag at 05/01/24 1321   CARBOplatin  (PARAPLATIN ) 80 mg in sodium chloride  0.9 % 100 mL chemo infusion  80 mg Intravenous Once Lanny Callander, MD       PACLitaxel  (TAXOL ) 96 mg in sodium chloride  0.9 % 250 mL chemo infusion (</= 80mg /m2)  45 mg/m2 (Treatment Plan Recorded) Intravenous Once Pasam, Avinash, MD        HISTORY OF PRESENT ILLNESS:   Oncology History  Primary cancer of tonsillar fossa (HCC)  03/04/2024 Initial Diagnosis   Primary cancer of tonsillar  fossa (HCC)   03/04/2024 Cancer Staging   Staging form: Pharynx - HPV-Mediated Oropharynx, AJCC 8th Edition - Clinical stage from 03/04/2024: Stage II (cT3, cN0, cM0, p16+) - Signed by Autumn Millman, MD on 03/21/2024 Stage prefix: Initial diagnosis   04/10/2024 -  Chemotherapy   Patient is on Treatment Plan : HEAD/NECK Carboplatin  + Paclitaxel  + XRT q7d         REVIEW OF SYSTEMS:   Constitutional: Denies fevers, chills or abnormal weight loss Eyes: Denies blurriness of vision Ears, nose, mouth, throat, and face: Denies mucositis or sore throat Respiratory: Denies cough, dyspnea or wheezes Cardiovascular: Denies palpitation, chest discomfort or lower extremity swelling Gastrointestinal:  Denies nausea, heartburn or change in bowel habits Skin: Denies abnormal skin rashes Lymphatics: Denies new lymphadenopathy or easy bruising Neurological:Denies numbness, tingling or new weaknesses Behavioral/Psych: Mood is stable, no new changes  All other systems were reviewed with the patient and are negative.   VITALS:  Blood pressure (!) 116/58, pulse 75, temperature 98.1 F (36.7 C), resp. rate 17, weight 189 lb 12.8 oz (86.1 kg), SpO2 98%.  Wt Readings from Last 3 Encounters:  05/01/24 189 lb 12.8 oz (86.1 kg)  04/24/24 191 lb 14.4 oz (87 kg)  04/17/24 193 lb 12.8 oz (87.9 kg)    Body mass index is 25.74 kg/m.  Performance status (ECOG): {CHL ONC [id number]  PHYSICAL EXAM:   GENERAL:alert, no distress and comfortable SKIN: skin color, texture, turgor are normal, no rashes or significant lesions EYES: normal, Conjunctiva are pink and non-injected, sclera clear OROPHARYNX:no exudate, no erythema and lips, buccal mucosa, and tongue normal  NECK: supple, thyroid  normal size, non-tender, without nodularity LYMPH:  no palpable lymphadenopathy in the cervical, axillary or inguinal LUNGS: clear to auscultation and percussion with normal breathing effort HEART: regular rate & rhythm and  no murmurs and no lower extremity edema ABDOMEN:abdomen soft, non-tender and normal bowel sounds Musculoskeletal:no cyanosis of digits and no clubbing  NEURO: alert & oriented x 3 with fluent speech, no focal motor/sensory deficits  LABORATORY DATA:  I have reviewed the data as listed    Component Value Date/Time   NA 136 05/01/2024 1026   NA 140 08/21/2023 1307   K 4.8 05/01/2024 1026   CL 103 05/01/2024 1026   CO2 30 05/01/2024 1026   GLUCOSE 237 (H) 05/01/2024 1026   BUN 39 (H) 05/01/2024 1026   BUN 37 (H) 08/21/2023 1307   CREATININE 1.30 (H) 05/01/2024 1026   CALCIUM  8.3 (L) 05/01/2024 1026   PROT 6.0 (L) 05/01/2024 1026  PROT 6.8 08/21/2023 1306   ALBUMIN  3.2 (L) 05/01/2024 1026   ALBUMIN  4.3 08/21/2023 1306   AST 9 (L) 05/01/2024 1026   ALT 14 05/01/2024 1026   ALKPHOS 44 05/01/2024 1026   BILITOT 1.0 05/01/2024 1026   GFRNONAA 57 (L) 05/01/2024 1026   GFRAA 46 (L) 09/22/2020 0859    No results found for: SPEP, UPEP  Lab Results  Component Value Date   WBC 1.6 (L) 05/01/2024   NEUTROABS 1.2 (L) 05/01/2024   HGB 12.5 (L) 05/01/2024   HCT 36.8 (L) 05/01/2024   MCV 90.9 05/01/2024   PLT 73 (L) 05/01/2024      Chemistry      Component Value Date/Time   NA 136 05/01/2024 1026   NA 140 08/21/2023 1307   K 4.8 05/01/2024 1026   CL 103 05/01/2024 1026   CO2 30 05/01/2024 1026   BUN 39 (H) 05/01/2024 1026   BUN 37 (H) 08/21/2023 1307   CREATININE 1.30 (H) 05/01/2024 1026      Component Value Date/Time   CALCIUM  8.3 (L) 05/01/2024 1026   ALKPHOS 44 05/01/2024 1026   AST 9 (L) 05/01/2024 1026   ALT 14 05/01/2024 1026   BILITOT 1.0 05/01/2024 1026       RADIOGRAPHIC STUDIES: I have personally reviewed the radiological images as listed and agreed with the findings in the report. CUP PACEART REMOTE DEVICE CHECK Result Date: 04/23/2024 ICD Scheduled remote reviewed. Normal device function.  Presenting rhythm: AP-AS/BiVP with bigeminy PACs. 1 VT-NS  classified episode on 03/13/24 at 00:37, 15 beats, NSVT at 220 bpm. Next remote 91 days. MC, CVRS  IR GASTROSTOMY TUBE MOD SED Result Date: 04/09/2024 INDICATION: he will receive chemo/radiation for head and neck cancer EXAM: Procedures: 1. FLUOROSCOPIC NASOGASTRIC TUBE PLACEMENT 2. PERCUTANEOUS GASTROSTOMY FEEDING TUBE PLACEMENT COMPARISON:  PET-CT, 02/28/2024.  CT chest, 02/18/2024. MEDICATIONS: Ancef  2 gm IV; Antibiotics were administered within 1 hour of the procedure. 1 mg glucagon  IV. CONTRAST:  20 mL of Isovue  300 administered into the gastric lumen. ANESTHESIA/SEDATION: Moderate (conscious) sedation was employed during this procedure. A total of Versed  1 mg and Fentanyl  50 mcg was administered intravenously. Moderate Sedation Time: 13 minutes. The patient's level of consciousness and vital signs were monitored continuously by radiology nursing throughout the procedure under my direct supervision. FLUOROSCOPY: Radiation Exposure Index and estimated peak skin dose (PSD); Reference air kerma (RAK), 6 mGy. COMPLICATIONS: None immediate. PROCEDURE: Informed written consent was obtained from the patient and/or patient's representative following explanation of the procedure, risks, benefits and alternatives. A time out was performed prior to the initiation of the procedure. Maximal barrier sterile technique utilized including caps, mask, sterile gowns, sterile gloves, large sterile drape, hand hygiene and sterile prep. The LEFT upper quadrant was sterilely prepped and draped. An oral gastric catheter was inserted into the stomach under fluoroscopy. The LEFT costal margin and barium opacified transverse colon were identified and avoided. Air was injected into the stomach for insufflation and visualization under fluoroscopy. Under sterile conditions and local anesthesia, 2 T tacks were utilized to pexy the anterior aspect of the stomach against the ventral abdominal wall. Contrast injection confirmed appropriate  positioning of each of the T tacks. An incision was made between the T tacks and a 17 gauge trocar needle was utilized to access the stomach. Needle position was confirmed within the stomach with aspiration of air and injection of a small amount of contrast. A stiff guidewire was advanced into the gastric lumen and  under intermittent fluoroscopic guidance, the access needle was exchanged for a telescoping peel-away sheath, ultimately allowing placement of a 16 Fr balloon retention gastrostomy tube. The retention balloon was insufflated with a mixture of dilute saline and contrast and pulled taut against the anterior wall of the stomach. The external disc was cinched. Contrast injection confirms positioning within the stomach. Several spot radiographic images were obtained in various obliquities for documentation. The patient tolerated procedure well without immediate post procedural complication. FINDINGS: After successful fluoroscopic guided placement, the gastrostomy tube is appropriately positioned with internal retention balloon against the ventral aspect of the gastric lumen. IMPRESSION: Successful fluoroscopic insertion of a 16 Fr balloon retention gastrostomy tube. The gastrostomy may be used immediately for medication administration and in 4 hrs for the initiation of feeds. RECOMMENDATIONS: The patient will return to Vascular Interventional Radiology (VIR) for routine feeding tube evaluation and exchange in 6 months. Thom Hall, MD [facility] Electronically Signed   By: Thom Hall M.D.   On: 04/09/2024 16:10   IR IMAGING GUIDED PORT INSERTION Result Date: 04/09/2024 INDICATION: he will receive chemo for head and neck cancer EXAM: IMPLANTED PORT A CATH PLACEMENT WITH ULTRASOUND AND FLUOROSCOPIC GUIDANCE MEDICATIONS: None ANESTHESIA/SEDATION: Moderate (conscious) sedation was employed during this procedure. A total of Versed  1 mg and Fentanyl  50  mcg was administered intravenously. Moderate Sedation Time: 21 minutes. The patient's level of consciousness and vital signs were monitored continuously by radiology nursing throughout the procedure under my direct supervision. FLUOROSCOPY: Radiation Exposure Index and estimated peak skin dose (PSD); Reference air kerma (RAK), 1 mGy. COMPLICATIONS: None immediate. PROCEDURE: The procedure, risks, benefits, and alternatives were explained to the patient. Questions regarding the procedure were encouraged and answered. The patient understands and consents to the procedure. The neck and chest were prepped with chlorhexidine  in a sterile fashion, and a sterile drape was applied covering the operative field. Maximum barrier sterile technique with sterile gowns and gloves were used for the procedure. A timeout was performed prior to the initiation of the procedure. Local anesthesia was provided with 1% lidocaine  with epinephrine . After creating a small venotomy incision, a micropuncture kit was utilized to access the internal jugular vein under direct, real-time ultrasound guidance. Ultrasound image documentation was performed. The microwire was kinked to measure appropriate catheter length. A subcutaneous port pocket was then created along the upper chest wall utilizing a combination of sharp and blunt dissection. The pocket was irrigated with sterile saline. A single lumen power injectable port was chosen for placement. The 8 Fr catheter was tunneled from the port pocket site to the venotomy incision. The port was placed in the pocket. The external catheter was trimmed to appropriate length. At the venotomy, an 8 Fr peel-away sheath was placed over a guidewire under fluoroscopic guidance. The catheter was then placed through the sheath and the sheath was removed. Final catheter positioning was confirmed and documented with a fluoroscopic spot radiograph. The port was accessed with a Huber needle, aspirated and flushed  with heparinized saline. The port pocket incision was closed with interrupted 3-0 Vicryl suture then Dermabond was applied, including at the venotomy incision. Dressings were placed. The patient tolerated the procedure well without immediate post procedural complication. IMPRESSION: Successful placement of a RIGHT internal jugular approach power injectable Port-A-Cath. The tip of the catheter is positioned within the proximal RIGHT atrium. The catheter is ready for immediate use. Thom Hall, MD [facility] Electronically Signed  By: Thom Hall M.D.   On: 04/09/2024 16:05

## 2024-04-30 NOTE — Progress Notes (Signed)
 Brief follow up with patient and wife.   Patient followed RD guidelines to give formula slowly over 20 minutes. He has not had any more diarrhea. He is still very concerned he may experience diarrhea and urgency so is not willing to take a feeding before leaving the cancer center today or during his infusions.   Wife bought some Gatorade and will provide to patient via PEG. She continues to work with a Teacher, early years/pre and the doctor to ensure medications are crushable or in liquid form to be given via tube.  The maximum amount of TF he has taken has only been 4 cartons. Encouraged to increase to 5 cartons daily split into 5 feedings. Or, give 1 and 1/4 cartons 4 times daily. Continue same free water flushes. Advance to goal as tolerated.  Goal: Osmolite 1.5 (or equivalent) bolus, 7 cartons per day goal rate divided in 4 feedings (2 cartons at 8 am, 12, 4 pm and 1 carton at 8 pm).  Flush with 60 ml before and after each feeding.  Additional water flush of 240 ml TID will be needed to better meet hydration needs At goal rate will provide 2485 calories, 104 g protein and 2460 ml free water. Meets 100% of needs  Follow up in infusion tomorrow.

## 2024-04-30 NOTE — Telephone Encounter (Signed)
 Pt's wife called stating pt is having radiation therapy on his neck and his throat is swollen and is unable to swallow. States he now has to have all his medication given to him through a G-tube. States his Glipizide  is the extended release, he needs a Rx for Glipizide  that he can crush.

## 2024-04-30 NOTE — Telephone Encounter (Signed)
 Perfectly fine to stop the rosuvastatin  for 3 weeks. Eliquis  can be crushed. They make Entresto  Sprinkle capsules, the pellets in the capsule can be flushed through a feeding tube. Neither farxiga  nor Jardiance should be crushed, may just have to skip them for a while. If weight goes up due to fluid retention we can increase his diuretic. Adding PharmD for advice.

## 2024-04-30 NOTE — Assessment & Plan Note (Signed)
 Please review oncology history for additional details and timeline of events.   cT3, cN0, cM0, p16 positive disease, stage II. Patient had consultation with Dr. Floretta, otolaryngology at [facility] on 03/19/2024.  Given his cardiac history, he was felt to be a high risk candidate for surgery and hence chemoradiation was recommended. Given his CKD, he is not a candidate for cisplatin.  Hence plan made to proceed with weekly carboplatin  and paclitaxel  during the course of radiation. Monitoring of kidney function and blood counts is essential to adjust chemotherapy dosing and manage potential side effects such as nausea, vomiting, and fatigue. Anti-nausea medications are provided to manage these effects.  If further renal dysfunction is noted, chemotherapy will be adjusted or skipped.  He started concurrent chemoradiation with weekly carboplatin  and paclitaxel  from 04/10/2024. He has experience fatigue.  Labs today reveal creatinine of 1.39, closer to his baseline.  Platelet count was slightly decreased predating the chemotherapy initiation.  Platelet count is 97,000 today.  Plan is to continue chemotherapy as long as platelet count is above 50,000. Given fatigue symptoms, and to avoid further thrombocytopenia, we will dose reduce carboplatin  to AUC 1.5 and proceed with cycle 2 of chemotherapy today with carboplatin  and paclitaxel . He is established with dietitian, speech pathology and physical therapy for supportive care. RTC in 1 week for labs, office visit and continuation of chemotherapy.

## 2024-05-01 ENCOUNTER — Inpatient Hospital Stay: Admitting: Nutrition

## 2024-05-01 ENCOUNTER — Inpatient Hospital Stay (HOSPITAL_BASED_OUTPATIENT_CLINIC_OR_DEPARTMENT_OTHER): Admitting: Nurse Practitioner

## 2024-05-01 ENCOUNTER — Other Ambulatory Visit

## 2024-05-01 ENCOUNTER — Inpatient Hospital Stay

## 2024-05-01 ENCOUNTER — Other Ambulatory Visit: Payer: Self-pay

## 2024-05-01 ENCOUNTER — Ambulatory Visit (HOSPITAL_COMMUNITY)
Admission: RE | Admit: 2024-05-01 | Discharge: 2024-05-01 | Disposition: A | Discharge status: Home / Self Care | Source: Ambulatory Visit | Attending: Nurse Practitioner | Admitting: Nurse Practitioner

## 2024-05-01 ENCOUNTER — Ambulatory Visit
Admission: RE | Admit: 2024-05-01 | Discharge: 2024-05-01 | Disposition: A | Discharge status: Home / Self Care | Source: Ambulatory Visit | Attending: Radiation Oncology

## 2024-05-01 VITALS — BP 116/58 | HR 75 | Temp 98.1°F | Resp 17 | Wt 189.8 lb

## 2024-05-01 DIAGNOSIS — Z95828 Presence of other vascular implants and grafts: Secondary | ICD-10-CM

## 2024-05-01 DIAGNOSIS — R042 Hemoptysis: Secondary | ICD-10-CM | POA: Diagnosis not present

## 2024-05-01 DIAGNOSIS — Z510 Encounter for antineoplastic radiation therapy: Secondary | ICD-10-CM | POA: Diagnosis not present

## 2024-05-01 DIAGNOSIS — C090 Malignant neoplasm of tonsillar fossa: Secondary | ICD-10-CM

## 2024-05-01 LAB — CBC WITH DIFFERENTIAL (CANCER CENTER ONLY)
Abs Immature Granulocytes: 0.01 K/uL (ref 0.00–0.07)
Basophils Absolute: 0 K/uL (ref 0.0–0.1)
Basophils Relative: 1 %
Eosinophils Absolute: 0 K/uL (ref 0.0–0.5)
Eosinophils Relative: 1 %
HCT: 36.8 % — ABNORMAL LOW (ref 39.0–52.0)
Hemoglobin: 12.5 g/dL — ABNORMAL LOW (ref 13.0–17.0)
Immature Granulocytes: 1 %
Lymphocytes Relative: 14 %
Lymphs Abs: 0.2 K/uL — ABNORMAL LOW (ref 0.7–4.0)
MCH: 30.9 pg (ref 26.0–34.0)
MCHC: 34 g/dL (ref 30.0–36.0)
MCV: 90.9 fL (ref 80.0–100.0)
Monocytes Absolute: 0.2 K/uL (ref 0.1–1.0)
Monocytes Relative: 11 %
Neutro Abs: 1.2 K/uL — ABNORMAL LOW (ref 1.7–7.7)
Neutrophils Relative %: 72 %
Platelet Count: 73 K/uL — ABNORMAL LOW (ref 150–400)
RBC: 4.05 MIL/uL — ABNORMAL LOW (ref 4.22–5.81)
RDW: 12.4 % (ref 11.5–15.5)
WBC Count: 1.6 K/uL — ABNORMAL LOW (ref 4.0–10.5)
nRBC: 0 % (ref 0.0–0.2)

## 2024-05-01 LAB — CMP (CANCER CENTER ONLY)
ALT: 14 U/L (ref 0–44)
AST: 9 U/L — ABNORMAL LOW (ref 15–41)
Albumin: 3.2 g/dL — ABNORMAL LOW (ref 3.5–5.0)
Alkaline Phosphatase: 44 U/L (ref 38–126)
Anion gap: 3 — ABNORMAL LOW (ref 5–15)
BUN: 39 mg/dL — ABNORMAL HIGH (ref 8–23)
CO2: 30 mmol/L (ref 22–32)
Calcium: 8.3 mg/dL — ABNORMAL LOW (ref 8.9–10.3)
Chloride: 103 mmol/L (ref 98–111)
Creatinine: 1.3 mg/dL — ABNORMAL HIGH (ref 0.61–1.24)
GFR, Estimated: 57 mL/min — ABNORMAL LOW (ref >60)
Glucose, Bld: 237 mg/dL — ABNORMAL HIGH (ref 70–99)
Potassium: 4.8 mmol/L (ref 3.5–5.1)
Sodium: 136 mmol/L (ref 135–145)
Total Bilirubin: 1 mg/dL (ref 0.0–1.2)
Total Protein: 6 g/dL — ABNORMAL LOW (ref 6.5–8.1)

## 2024-05-01 LAB — RAD ONC ARIA SESSION SUMMARY
Course Elapsed Days: 21
Plan Fractions Treated to Date: 14
Plan Prescribed Dose Per Fraction: 2 Gy
Plan Total Fractions Prescribed: 35
Plan Total Prescribed Dose: 70 Gy
Reference Point Dosage Given to Date: 28 Gy
Reference Point Session Dosage Given: 2 Gy
Session Number: 14

## 2024-05-01 MED ORDER — SODIUM CHLORIDE 0.9 % IV SOLN: INTRAVENOUS | Status: DC

## 2024-05-01 MED ORDER — OXYCODONE HCL 5 MG/5ML PO SOLN: 5.0000 mg | Freq: Four times a day (QID) | ORAL | 0 refills | Status: AC | PRN

## 2024-05-01 MED ORDER — GUAIFENESIN 300 MG/15ML PO LIQD: 600.0000 mg | Freq: Four times a day (QID) | ORAL | 1 refills | Status: AC | PRN

## 2024-05-01 MED ORDER — SODIUM CHLORIDE 0.9 % IV SOLN
83.9000 mg | Freq: Once | INTRAVENOUS | Status: AC
  Administered 2024-05-01: 80 mg via INTRAVENOUS
  Filled 2024-05-01: qty 8

## 2024-05-01 MED ORDER — DEXAMETHASONE SODIUM PHOSPHATE 10 MG/ML IJ SOLN
10.0000 mg | Freq: Once | INTRAMUSCULAR | Status: AC
  Administered 2024-05-01: 10 mg via INTRAVENOUS
  Filled 2024-05-01: qty 1

## 2024-05-01 MED ORDER — DIPHENHYDRAMINE HCL 50 MG/ML IJ SOLN
25.0000 mg | Freq: Once | INTRAMUSCULAR | Status: AC
  Administered 2024-05-01: 25 mg via INTRAVENOUS
  Filled 2024-05-01: qty 1

## 2024-05-01 MED ORDER — PALONOSETRON HCL INJECTION 0.25 MG/5ML
0.2500 mg | Freq: Once | INTRAVENOUS | Status: AC
  Administered 2024-05-01: 0.25 mg via INTRAVENOUS
  Filled 2024-05-01: qty 5

## 2024-05-01 MED ORDER — SODIUM CHLORIDE 0.9% FLUSH
10.0000 mL | Freq: Once | INTRAVENOUS | Status: AC
  Administered 2024-05-01: 10 mL

## 2024-05-01 MED ORDER — FAMOTIDINE IN NACL 20-0.9 MG/50ML-% IV SOLN
20.0000 mg | Freq: Once | INTRAVENOUS | Status: AC
  Administered 2024-05-01: 20 mg via INTRAVENOUS
  Filled 2024-05-01: qty 50

## 2024-05-01 MED ORDER — SODIUM CHLORIDE 0.9 % IV SOLN
45.0000 mg/m2 | Freq: Once | INTRAVENOUS | Status: AC
  Administered 2024-05-01: 96 mg via INTRAVENOUS
  Filled 2024-05-01: qty 16

## 2024-05-01 NOTE — Telephone Encounter (Signed)
 Left pt and his wife a message making them aware.

## 2024-05-01 NOTE — Progress Notes (Signed)
 EPIC Encounter for ICM Monitoring  Patient Name: Christian Bowman is a 78 y.o. male Date: 05/01/2024 Primary Care Physican: Francyne Headland, MD Primary Cardiologist: Croitoru/McLean Electrophysiologist: Inocencio Pore Pacing: 98.2%            09/13/2023 Office Weight: 192 lbs 01/01/2024 Weight: 189 lbs 03/14/2024 Weight: 189 lbs   Since 20-Feb-2024 Time in AT/AF  0.4 hours/day (1.6 %)   Spoke with wife and heart failure questions reviewed.  Transmission results reviewed.  Pt is going through chemo and radiation for oral cancer.  All meds are being given by feeding tube at this time.  Liquids are being pushed at this time and he will be receiving as much as 140 oz daily and she is concerned about fluid accumulation.     Diet: Wife records sodium intake by reviewing food labels.     Optivol thoracic impedance suggesting possible fluid accumulation starting 7/14 but trending back toward baseline.    Prescribed:  Furosemide  20 mg Take 1 tablet (20 mg total) by mouth daily as needed (for a weight over 190 lbs).   Labs: 04/24/2024 Creatinine 1.39, BUN 43, Potassium 4.6, Sodium 138, GFR 53  04/17/2024 Creatinine 1.50, BUN 61, Potassium 5.1, Sodium 138, GFR 48  04/09/2024 Creatinine 1.20, BUN 33, Potassium 4.2, Sodium 138  03/21/2024 Creatinine 1.63, BUN 30, Potassium 4.4, Sodium 139, GFR 43 02/19/2024 Creatinine 1.75, BUN 30, Potassium 4.4, Sodium 139  A complete set of results can be found in Results Review.   Recommendations: Will recheck fluid levels tomorrow to determine if PRN Lasix  is needed.   Advised wife will check weekly fluid level report since patient is having significant fluid intake via feeding tube.       Follow-up plan: ICM clinic phone appointment on 05/02/2024 to recheck fluid levels.   91 day device clinic remote transmission 04/22/2024.     EP/Cardiology Office Visits:  Recall 08/18/2024 with Dr. Rolan.   05/23/2024 with Dr Francyne.  Recall 06/18/2024 with EP APP.   Copy of ICM  check sent to Dr. Inocencio.  3 month ICM trend: 04/28/2024.    12-14 Month ICM trend:     [name], RN 05/01/2024 10:58 AM

## 2024-05-01 NOTE — Progress Notes (Signed)
 Per Dr Lanny, reduce Carboplatin  to AUC = 1 today. T.O./Mitzy Naron Viktoria, Berdine.D., CPP 05/01/2024@1 :23 PM

## 2024-05-01 NOTE — Patient Instructions (Signed)
 [facility] - A DEPT OF [facility]   Discharge Instructions: Thank you for choosing [facility] to provide your oncology and hematology care.   If you have a lab appointment with the Cancer Center, please go directly to the Cancer Center and check in at the registration area.   Wear comfortable clothing and clothing appropriate for easy access to any Portacath or PICC line.   We strive to give you quality time with your provider. You may need to reschedule your appointment if you arrive late (15 or more minutes).  Arriving late affects you and other patients whose appointments are after yours.  Also, if you miss three or more appointments without notifying the office, you may be dismissed from the clinic at the provider's discretion.      For prescription refill requests, have your pharmacy contact our office and allow 72 hours for refills to be completed.    Today you received the following chemotherapy and/or immunotherapy agents: Paclitaxel (Taxol) and Carboplatin       To help prevent nausea and vomiting after your treatment, we encourage you to take your nausea medication as directed.  BELOW ARE SYMPTOMS THAT SHOULD BE REPORTED IMMEDIATELY: *FEVER GREATER THAN 100.4 F (38 C) OR HIGHER *CHILLS OR SWEATING *NAUSEA AND VOMITING THAT IS NOT CONTROLLED WITH YOUR NAUSEA MEDICATION *UNUSUAL SHORTNESS OF BREATH *UNUSUAL BRUISING OR BLEEDING *URINARY PROBLEMS (pain or burning when urinating, or frequent urination) *BOWEL PROBLEMS (unusual diarrhea, constipation, pain near the anus) TENDERNESS IN MOUTH AND THROAT WITH OR WITHOUT PRESENCE OF ULCERS (sore throat, sores in mouth, or a toothache) UNUSUAL RASH, SWELLING OR PAIN  UNUSUAL VAGINAL DISCHARGE OR ITCHING   Items with * indicate a potential emergency and should be followed up as soon as possible or go to the Emergency Department if any problems should occur.  Please show the CHEMOTHERAPY  ALERT CARD or IMMUNOTHERAPY ALERT CARD at check-in to the Emergency Department and triage nurse.  Should you have questions after your visit or need to cancel or reschedule your appointment, please contact [facility]  Dept: [phone]  and follow the prompts.  Office hours are 8:00 a.m. to 4:30 p.m. Monday - Friday. Please note that voicemails left after 4:00 p.m. may not be returned until the following business day.  We are closed weekends and major holidays. You have access to a nurse at all times for urgent questions. Please call the main number to the clinic Dept: [phone] and follow the prompts.   For any non-urgent questions, you may also contact your provider using MyChart. We now offer e-Visits for anyone 27 and older to request care online for non-urgent symptoms. For details visit [email or web address].   Also download the MyChart app! Go to the app store, search "MyChart", open the app, select , and log in with your MyChart username and password.

## 2024-05-01 NOTE — Progress Notes (Signed)
 Nutrition follow-up completed with patient and wife during infusion.  Patient is receiving concurrent chemoradiation therapy with carboplatin  and paclitaxel  and is followed by Dr. Autumn and Dr. Izell.  Final radiation therapy is scheduled for August 27.  PEG: April 09, 2024 Home Health: [facility]  Weight:  189 pounds 12.8 ounces July 31 191 pounds 14.4 ounces July 24 193 pounds 12.8 ounces July 17 198 pounds 6.6 ounces May 19 5% weight loss in less than 3 months.  Labs include glucose 237, BUN 39, creatinine 1.3, albumin  3.2  Patient is unable to swallow most foods and beverages.  He is not eating.  He wants to try some ice cream today.  He is unable to speak very much which is very frustrating for him.  He is tolerating 1 carton of Osmolite 1.5 with 60 mL of free water before and after well.  He has not had any further diarrhea.  He is giving 1 carton very slowly over approximately 20 minutes.  He continues to avoid using feeding tube if he is going to have to travel for treatment and will not give a feeding during treatment.  He has been unable to increase to 5 cartons daily.  Goal: Osmolite 1.5 or equivalent, 7 cartons per day divided into 4 feedings with 60 mL later free water flush before and after each feeding.  He will give an additional 240 mL free water flushes 3 times a day to better meet hydration needs.  This provides 2485 cal, 104 g protein, 2460 mL of free water.  Patient will require long-term tube feeding to meet nutrition needs.  Nutrition diagnosis:  Predicted sub-optimal energy intake has evolved into inadequate oral intake.  Intervention: Encouraged patient to begin to advance tube feeding to provide adequate nutrition for healing. Continue to consume oral intake as tolerated.  Monitoring, evaluation, goals: Patient will tolerate adequate calories and protein to minimize weight loss.  Next visit: Thursday, August 7 during infusion with Cyndi  **Disclaimer: This note  was dictated with voice recognition software. Similar sounding words can inadvertently be transcribed and this note may contain transcription errors which may not have been corrected upon publication of note.**

## 2024-05-02 ENCOUNTER — Ambulatory Visit: Attending: Cardiology

## 2024-05-02 ENCOUNTER — Other Ambulatory Visit: Payer: Self-pay

## 2024-05-02 ENCOUNTER — Ambulatory Visit
Admission: RE | Admit: 2024-05-02 | Discharge: 2024-05-02 | Disposition: A | Discharge status: Home / Self Care | Source: Ambulatory Visit | Attending: Radiation Oncology | Admitting: Radiation Oncology

## 2024-05-02 DIAGNOSIS — Z510 Encounter for antineoplastic radiation therapy: Secondary | ICD-10-CM | POA: Diagnosis present

## 2024-05-02 DIAGNOSIS — I5042 Chronic combined systolic (congestive) and diastolic (congestive) heart failure: Secondary | ICD-10-CM | POA: Diagnosis not present

## 2024-05-02 DIAGNOSIS — C090 Malignant neoplasm of tonsillar fossa: Secondary | ICD-10-CM | POA: Diagnosis present

## 2024-05-02 DIAGNOSIS — Z95810 Presence of automatic (implantable) cardiac defibrillator: Secondary | ICD-10-CM

## 2024-05-02 LAB — RAD ONC ARIA SESSION SUMMARY
Course Elapsed Days: 22
Plan Fractions Treated to Date: 15
Plan Prescribed Dose Per Fraction: 2 Gy
Plan Total Fractions Prescribed: 35
Plan Total Prescribed Dose: 70 Gy
Reference Point Dosage Given to Date: 30 Gy
Reference Point Session Dosage Given: 2 Gy
Session Number: 15

## 2024-05-02 NOTE — Progress Notes (Signed)
 EPIC Encounter for ICM Monitoring  Patient Name: Christian Bowman is a 78 y.o. male Date: 05/02/2024 Primary Care Physican: Marvine Rush, MD  Primary Cardiologist: Croitoru/McLean Electrophysiologist: Inocencio Bi-V Pacing: 97.9%            09/13/2023 Office Weight: 192 lbs 01/01/2024 Weight: 189 lbs 03/14/2024 Weight: 189 lbs   Since 20-Feb-2024 Time in AT/AF  0.4 hours/day (1.6 %)   Spoke with wife.   Transmission results reviewed.  Pt is receiving daily radiation treatment today.  His is receiving chemo and radiation treatments to tonsillar area which caused swelling in tongue.  He is unable to swallow oral meds or liquids.  Meds are being crushed and given feeding tube as well as all liquids.    Diet: Prescribed Osmolite 1.5 - 2 cartons at 8 AM, noon, 4 pm bolus syringe and 1 carton at 8 PM.   Flush with 60 ml walter before and after each feeding.  Give Additional 240 ml TID between feedings for additional hydration.     Optivol thoracic impedance suggesting possible fluid accumulation starting 7/14.    Prescribed:  Furosemide  20 mg Take 1 tablet (20 mg total) by mouth daily as needed (for a weight over 190 lbs).   Labs: 05/01/2024 Creatinine 1.30, BUN 39, Potassium 4.8, Sodium 136, GFR 57 04/24/2024 Creatinine 1.39, BUN 43, Potassium 4.6, Sodium 138, GFR 53  04/17/2024 Creatinine 1.50, BUN 61, Potassium 5.1, Sodium 138, GFR 48  04/09/2024 Creatinine 1.20, BUN 33, Potassium 4.2, Sodium 138  03/21/2024 Creatinine 1.63, BUN 30, Potassium 4.4, Sodium 139, GFR 43 02/19/2024 Creatinine 1.75, BUN 30, Potassium 4.4, Sodium 139  A complete set of results can be found in Results Review.   Recommendations:  Advised to give Furosemide  20 mg daily x 2 days.  Copy Sent to Dr Francyne for review and provide any further recommendations if needed.      Follow-up plan: ICM clinic phone appointment on 05/06/2024 to recheck fluid levels weekly during time fluid intake is increased.   91 day device clinic  remote transmission 07/22/2024.     EP/Cardiology Office Visits:  Recall 08/18/2024 with Dr. Rolan.   05/23/2024 with Dr Francyne.  Recall 06/18/2024 with EP APP.   Copy of ICM check sent to Dr. Inocencio.  3 month ICM trend: 05/02/2024.    12-14 Month ICM trend:     [name], RN 05/02/2024 7:04 AM

## 2024-05-05 ENCOUNTER — Ambulatory Visit
Admission: RE | Admit: 2024-05-05 | Discharge: 2024-05-05 | Disposition: A | Discharge status: Home / Self Care | Source: Ambulatory Visit | Attending: Radiation Oncology | Admitting: Radiation Oncology

## 2024-05-05 ENCOUNTER — Telehealth: Payer: Self-pay | Admitting: Oncology

## 2024-05-05 DIAGNOSIS — R570 Cardiogenic shock: Secondary | ICD-10-CM | POA: Diagnosis not present

## 2024-05-05 DIAGNOSIS — K625 Hemorrhage of anus and rectum: Secondary | ICD-10-CM | POA: Diagnosis not present

## 2024-05-05 NOTE — Telephone Encounter (Signed)
 I contacted Christian Bowman to inform him of his upcoming de-coupled appointments. I explained that Christian Bowman will see Dr. Autumn on 8/21 and infusion on 8/22 due to our infusion availability. Christian Bowman is aware of all appointment details.

## 2024-05-06 ENCOUNTER — Inpatient Hospital Stay (HOSPITAL_COMMUNITY)
Admission: EM | Admit: 2024-05-06 | Discharge: 2024-05-19 | DRG: 291 | Disposition: A | Discharge status: Home Health | Attending: Internal Medicine | Admitting: Internal Medicine

## 2024-05-06 ENCOUNTER — Ambulatory Visit

## 2024-05-06 ENCOUNTER — Observation Stay (HOSPITAL_COMMUNITY)

## 2024-05-06 ENCOUNTER — Ambulatory Visit: Attending: Cardiology

## 2024-05-06 ENCOUNTER — Other Ambulatory Visit: Payer: Self-pay

## 2024-05-06 ENCOUNTER — Encounter (HOSPITAL_COMMUNITY): Payer: Self-pay

## 2024-05-06 DIAGNOSIS — C090 Malignant neoplasm of tonsillar fossa: Secondary | ICD-10-CM | POA: Diagnosis present

## 2024-05-06 DIAGNOSIS — Z23 Encounter for immunization: Secondary | ICD-10-CM

## 2024-05-06 DIAGNOSIS — E875 Hyperkalemia: Secondary | ICD-10-CM | POA: Diagnosis present

## 2024-05-06 DIAGNOSIS — I252 Old myocardial infarction: Secondary | ICD-10-CM

## 2024-05-06 DIAGNOSIS — E861 Hypovolemia: Secondary | ICD-10-CM | POA: Diagnosis present

## 2024-05-06 DIAGNOSIS — E785 Hyperlipidemia, unspecified: Secondary | ICD-10-CM | POA: Diagnosis present

## 2024-05-06 DIAGNOSIS — N170 Acute kidney failure with tubular necrosis: Secondary | ICD-10-CM | POA: Diagnosis present

## 2024-05-06 DIAGNOSIS — I5022 Chronic systolic (congestive) heart failure: Secondary | ICD-10-CM | POA: Diagnosis present

## 2024-05-06 DIAGNOSIS — N401 Enlarged prostate with lower urinary tract symptoms: Secondary | ICD-10-CM | POA: Diagnosis present

## 2024-05-06 DIAGNOSIS — I25810 Atherosclerosis of coronary artery bypass graft(s) without angina pectoris: Secondary | ICD-10-CM | POA: Diagnosis present

## 2024-05-06 DIAGNOSIS — Z8673 Personal history of transient ischemic attack (TIA), and cerebral infarction without residual deficits: Secondary | ICD-10-CM

## 2024-05-06 DIAGNOSIS — I4892 Unspecified atrial flutter: Secondary | ICD-10-CM | POA: Diagnosis present

## 2024-05-06 DIAGNOSIS — K625 Hemorrhage of anus and rectum: Principal | ICD-10-CM | POA: Diagnosis present

## 2024-05-06 DIAGNOSIS — T451X5A Adverse effect of antineoplastic and immunosuppressive drugs, initial encounter: Principal | ICD-10-CM | POA: Diagnosis present

## 2024-05-06 DIAGNOSIS — K5909 Other constipation: Secondary | ICD-10-CM | POA: Diagnosis present

## 2024-05-06 DIAGNOSIS — B370 Candidal stomatitis: Secondary | ICD-10-CM | POA: Diagnosis present

## 2024-05-06 DIAGNOSIS — Z931 Gastrostomy status: Secondary | ICD-10-CM

## 2024-05-06 DIAGNOSIS — R197 Diarrhea, unspecified: Secondary | ICD-10-CM | POA: Diagnosis present

## 2024-05-06 DIAGNOSIS — R042 Hemoptysis: Secondary | ICD-10-CM | POA: Diagnosis present

## 2024-05-06 DIAGNOSIS — E1165 Type 2 diabetes mellitus with hyperglycemia: Secondary | ICD-10-CM | POA: Diagnosis not present

## 2024-05-06 DIAGNOSIS — I130 Hypertensive heart and chronic kidney disease with heart failure and stage 1 through stage 4 chronic kidney disease, or unspecified chronic kidney disease: Secondary | ICD-10-CM | POA: Diagnosis present

## 2024-05-06 DIAGNOSIS — I5084 End stage heart failure: Secondary | ICD-10-CM | POA: Diagnosis present

## 2024-05-06 DIAGNOSIS — Z95828 Presence of other vascular implants and grafts: Secondary | ICD-10-CM

## 2024-05-06 DIAGNOSIS — E1122 Type 2 diabetes mellitus with diabetic chronic kidney disease: Secondary | ICD-10-CM | POA: Diagnosis present

## 2024-05-06 DIAGNOSIS — I701 Atherosclerosis of renal artery: Secondary | ICD-10-CM | POA: Diagnosis present

## 2024-05-06 DIAGNOSIS — R64 Cachexia: Secondary | ICD-10-CM | POA: Diagnosis present

## 2024-05-06 DIAGNOSIS — R339 Retention of urine, unspecified: Secondary | ICD-10-CM | POA: Diagnosis present

## 2024-05-06 DIAGNOSIS — Z7984 Long term (current) use of oral hypoglycemic drugs: Secondary | ICD-10-CM

## 2024-05-06 DIAGNOSIS — Z79899 Other long term (current) drug therapy: Secondary | ICD-10-CM

## 2024-05-06 DIAGNOSIS — F419 Anxiety disorder, unspecified: Secondary | ICD-10-CM | POA: Diagnosis present

## 2024-05-06 DIAGNOSIS — E43 Unspecified severe protein-calorie malnutrition: Secondary | ICD-10-CM | POA: Diagnosis present

## 2024-05-06 DIAGNOSIS — D701 Agranulocytosis secondary to cancer chemotherapy: Secondary | ICD-10-CM | POA: Diagnosis present

## 2024-05-06 DIAGNOSIS — D696 Thrombocytopenia, unspecified: Secondary | ICD-10-CM | POA: Diagnosis present

## 2024-05-06 DIAGNOSIS — D6959 Other secondary thrombocytopenia: Secondary | ICD-10-CM | POA: Diagnosis present

## 2024-05-06 DIAGNOSIS — Z8249 Family history of ischemic heart disease and other diseases of the circulatory system: Secondary | ICD-10-CM

## 2024-05-06 DIAGNOSIS — Z95810 Presence of automatic (implantable) cardiac defibrillator: Secondary | ICD-10-CM

## 2024-05-06 DIAGNOSIS — I480 Paroxysmal atrial fibrillation: Secondary | ICD-10-CM | POA: Diagnosis present

## 2024-05-06 DIAGNOSIS — D84821 Immunodeficiency due to drugs: Secondary | ICD-10-CM | POA: Diagnosis present

## 2024-05-06 DIAGNOSIS — Z79890 Hormone replacement therapy (postmenopausal): Secondary | ICD-10-CM

## 2024-05-06 DIAGNOSIS — Y842 Radiological procedure and radiotherapy as the cause of abnormal reaction of the patient, or of later complication, without mention of misadventure at the time of the procedure: Secondary | ICD-10-CM | POA: Diagnosis present

## 2024-05-06 DIAGNOSIS — I472 Ventricular tachycardia, unspecified: Secondary | ICD-10-CM | POA: Diagnosis not present

## 2024-05-06 DIAGNOSIS — I442 Atrioventricular block, complete: Secondary | ICD-10-CM | POA: Diagnosis present

## 2024-05-06 DIAGNOSIS — R579 Shock, unspecified: Secondary | ICD-10-CM

## 2024-05-06 DIAGNOSIS — E039 Hypothyroidism, unspecified: Secondary | ICD-10-CM | POA: Diagnosis present

## 2024-05-06 DIAGNOSIS — K649 Unspecified hemorrhoids: Secondary | ICD-10-CM | POA: Diagnosis present

## 2024-05-06 DIAGNOSIS — I2510 Atherosclerotic heart disease of native coronary artery without angina pectoris: Secondary | ICD-10-CM | POA: Diagnosis present

## 2024-05-06 DIAGNOSIS — F05 Delirium due to known physiological condition: Secondary | ICD-10-CM | POA: Diagnosis not present

## 2024-05-06 DIAGNOSIS — R5081 Fever presenting with conditions classified elsewhere: Secondary | ICD-10-CM | POA: Diagnosis present

## 2024-05-06 DIAGNOSIS — I5042 Chronic combined systolic (congestive) and diastolic (congestive) heart failure: Secondary | ICD-10-CM

## 2024-05-06 DIAGNOSIS — K921 Melena: Secondary | ICD-10-CM | POA: Diagnosis present

## 2024-05-06 DIAGNOSIS — N1831 Chronic kidney disease, stage 3a: Secondary | ICD-10-CM | POA: Diagnosis present

## 2024-05-06 DIAGNOSIS — Z7901 Long term (current) use of anticoagulants: Secondary | ICD-10-CM

## 2024-05-06 DIAGNOSIS — Z7952 Long term (current) use of systemic steroids: Secondary | ICD-10-CM

## 2024-05-06 DIAGNOSIS — Z7969 Long term (current) use of other immunomodulators and immunosuppressants: Secondary | ICD-10-CM

## 2024-05-06 DIAGNOSIS — I5023 Acute on chronic systolic (congestive) heart failure: Secondary | ICD-10-CM | POA: Diagnosis present

## 2024-05-06 DIAGNOSIS — Z953 Presence of xenogenic heart valve: Secondary | ICD-10-CM

## 2024-05-06 DIAGNOSIS — D61818 Other pancytopenia: Secondary | ICD-10-CM | POA: Diagnosis present

## 2024-05-06 DIAGNOSIS — I255 Ischemic cardiomyopathy: Secondary | ICD-10-CM | POA: Diagnosis present

## 2024-05-06 DIAGNOSIS — R221 Localized swelling, mass and lump, neck: Secondary | ICD-10-CM | POA: Diagnosis present

## 2024-05-06 DIAGNOSIS — Z888 Allergy status to other drugs, medicaments and biological substances status: Secondary | ICD-10-CM

## 2024-05-06 DIAGNOSIS — Z955 Presence of coronary angioplasty implant and graft: Secondary | ICD-10-CM

## 2024-05-06 DIAGNOSIS — Z6825 Body mass index (BMI) 25.0-25.9, adult: Secondary | ICD-10-CM

## 2024-05-06 DIAGNOSIS — R570 Cardiogenic shock: Principal | ICD-10-CM | POA: Diagnosis present

## 2024-05-06 LAB — CBC WITH DIFFERENTIAL/PLATELET
Abs Immature Granulocytes: 0.02 K/uL (ref 0.00–0.07)
Basophils Absolute: 0 K/uL (ref 0.0–0.1)
Basophils Relative: 1 %
Eosinophils Absolute: 0 K/uL (ref 0.0–0.5)
Eosinophils Relative: 1 %
HCT: 39.4 % (ref 39.0–52.0)
Hemoglobin: 13 g/dL (ref 13.0–17.0)
Immature Granulocytes: 1 %
Lymphocytes Relative: 9 %
Lymphs Abs: 0.2 K/uL — ABNORMAL LOW (ref 0.7–4.0)
MCH: 30.9 pg (ref 26.0–34.0)
MCHC: 33 g/dL (ref 30.0–36.0)
MCV: 93.6 fL (ref 80.0–100.0)
Monocytes Absolute: 0.1 K/uL (ref 0.1–1.0)
Monocytes Relative: 4 %
Neutro Abs: 1.8 K/uL (ref 1.7–7.7)
Neutrophils Relative %: 84 %
Platelets: 59 K/uL — ABNORMAL LOW (ref 150–400)
RBC: 4.21 MIL/uL — ABNORMAL LOW (ref 4.22–5.81)
RDW: 12.3 % (ref 11.5–15.5)
Smear Review: NORMAL
WBC: 2.1 K/uL — ABNORMAL LOW (ref 4.0–10.5)
nRBC: 0 % (ref 0.0–0.2)

## 2024-05-06 LAB — COMPREHENSIVE METABOLIC PANEL WITH GFR
ALT: 29 U/L (ref 0–44)
AST: 17 U/L (ref 15–41)
Albumin: 2.9 g/dL — ABNORMAL LOW (ref 3.5–5.0)
Alkaline Phosphatase: 52 U/L (ref 38–126)
Anion gap: 11 (ref 5–15)
BUN: 58 mg/dL — ABNORMAL HIGH (ref 8–23)
CO2: 28 mmol/L (ref 22–32)
Calcium: 8.5 mg/dL — ABNORMAL LOW (ref 8.9–10.3)
Chloride: 95 mmol/L — ABNORMAL LOW (ref 98–111)
Creatinine, Ser: 1.59 mg/dL — ABNORMAL HIGH (ref 0.61–1.24)
GFR, Estimated: 45 mL/min — ABNORMAL LOW (ref >60)
Glucose, Bld: 188 mg/dL — ABNORMAL HIGH (ref 70–99)
Potassium: 5.4 mmol/L — ABNORMAL HIGH (ref 3.5–5.1)
Sodium: 134 mmol/L — ABNORMAL LOW (ref 135–145)
Total Bilirubin: 1.5 mg/dL — ABNORMAL HIGH (ref 0.0–1.2)
Total Protein: 5.6 g/dL — ABNORMAL LOW (ref 6.5–8.1)

## 2024-05-06 LAB — POC OCCULT BLOOD, ED: Fecal Occult Bld: POSITIVE — AB

## 2024-05-06 LAB — TYPE AND SCREEN
ABO/RH(D): A POS
Antibody Screen: NEGATIVE

## 2024-05-06 LAB — GLUCOSE, CAPILLARY
Glucose-Capillary: 135 mg/dL — ABNORMAL HIGH (ref 70–99)
Glucose-Capillary: 124 mg/dL — ABNORMAL HIGH (ref 70–99)

## 2024-05-06 MED ORDER — ONDANSETRON HCL 4 MG/2ML IJ SOLN
4.0000 mg | Freq: Once | INTRAMUSCULAR | Status: DC
Start: 2024-05-06 — End: 2024-05-06

## 2024-05-06 MED ORDER — INSULIN ASPART 100 UNIT/ML IJ SOLN
0.0000 [IU] | INTRAMUSCULAR | Status: DC
  Administered 2024-05-06: 1 [IU] via SUBCUTANEOUS
  Administered 2024-05-07: 2 [IU] via SUBCUTANEOUS
  Administered 2024-05-07: 3 [IU] via SUBCUTANEOUS
  Administered 2024-05-07: 1 [IU] via SUBCUTANEOUS
  Administered 2024-05-07 – 2024-05-08 (×2): 2 [IU] via SUBCUTANEOUS
  Administered 2024-05-08 (×2): 3 [IU] via SUBCUTANEOUS

## 2024-05-06 MED ORDER — ACETAMINOPHEN 650 MG RE SUPP: 650.0000 mg | Freq: Four times a day (QID) | RECTAL | Status: DC | PRN

## 2024-05-06 MED ORDER — ACETAMINOPHEN 325 MG PO TABS
650.0000 mg | ORAL_TABLET | Freq: Four times a day (QID) | ORAL | Status: DC | PRN
  Administered 2024-05-07: 650 mg via ORAL
  Filled 2024-05-06: qty 2

## 2024-05-06 MED ORDER — ONDANSETRON 4 MG PO TBDP
8.0000 mg | ORAL_TABLET | Freq: Once | ORAL | Status: DC
  Filled 2024-05-06: qty 2

## 2024-05-06 MED ORDER — ONDANSETRON HCL 4 MG/2ML IJ SOLN
4.0000 mg | Freq: Four times a day (QID) | INTRAMUSCULAR | Status: DC | PRN
  Administered 2024-05-08 – 2024-05-16 (×4): 4 mg via INTRAVENOUS
  Filled 2024-05-06 (×3): qty 2

## 2024-05-06 MED ORDER — FLUCONAZOLE 10 MG/ML PO SUSR
100.0000 mg | Freq: Every day | ORAL | Status: DC
  Filled 2024-05-06: qty 10

## 2024-05-06 MED ORDER — CARVEDILOL 6.25 MG PO TABS
18.7500 mg | ORAL_TABLET | Freq: Two times a day (BID) | ORAL | Status: DC
  Administered 2024-05-07: 18.75 mg
  Filled 2024-05-06: qty 1

## 2024-05-06 MED ORDER — SODIUM CHLORIDE 0.9% FLUSH
3.0000 mL | Freq: Two times a day (BID) | INTRAVENOUS | Status: DC
  Administered 2024-05-06 – 2024-05-19 (×24): 3 mL via INTRAVENOUS

## 2024-05-06 MED ORDER — LEVOTHYROXINE SODIUM 50 MCG PO TABS
50.0000 ug | ORAL_TABLET | Freq: Every day | ORAL | Status: DC
  Administered 2024-05-07 – 2024-05-19 (×15): 50 ug
  Filled 2024-05-06 (×12): qty 1

## 2024-05-06 MED ORDER — SENNOSIDES-DOCUSATE SODIUM 8.6-50 MG PO TABS: 1.0000 | ORAL_TABLET | Freq: Every evening | ORAL | Status: DC | PRN

## 2024-05-06 MED ORDER — SODIUM ZIRCONIUM CYCLOSILICATE 10 G PO PACK
10.0000 g | PACK | Freq: Once | ORAL | Status: AC
  Administered 2024-05-06: 10 g
  Filled 2024-05-06: qty 1

## 2024-05-06 MED ORDER — OXYCODONE HCL 5 MG/5ML PO SOLN
5.0000 mg | Freq: Four times a day (QID) | ORAL | Status: DC | PRN
  Administered 2024-05-07 – 2024-05-19 (×9): 5 mg
  Filled 2024-05-06 (×10): qty 5

## 2024-05-06 NOTE — Hospital Course (Signed)
 Christian Bowman is a 78 y.o. 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

## 2024-05-06 NOTE — Progress Notes (Unsigned)
 EPIC Encounter for ICM Monitoring  Patient Name: Christian Bowman is a 78 y.o. male Date: 05/06/2024 Primary Care Physican: Francyne Headland, MD Primary Cardiologist: Croitoru/McLean Electrophysiologist: Inocencio Pore Pacing: 98.5%           09/13/2023 Office Weight: 192 lbs 01/01/2024 Weight: 189 lbs 03/14/2024 Weight: 189 lbs 05/01/2024 Office Weight: 189 lbs   Since 02-May-2024 Time in AT/AF   0.0 hours/day (0.0 %)   Spoke with wife and transmission results reviewed.  Pt took 3 days of PRN Lasix  last week.  Today, patient vomited 2 bottles of osmolite feeding at 7 AM and has diarrhea.   Pt refused any further fluid intake through feeding tube since this AM.  He does not always get the 240 ml water between feedings.    Current TX: Receiving chemo and radiation treatments d/t tonsillar cancer.  Feeding tube is being used for meds and liquids due to patient has difficulty swallowing.  He has been able to eat a little ice cream and mashed potatoes.     Diet: Prescribed Osmolite 1.5 - 2 cartons (each carton = 8 oz) at 8 AM, noon, 4 pm bolus syringe and 1 carton at 8 PM.   Flush with 60 ml walter before and after each feeding.  Give Additional 240 ml TID between feedings for additional hydration.     Optivol thoracic impedance suggesting possible fluid accumulation starting 7/14 and slight improvement after 3 days of PRN Furosemide .    Prescribed:  Furosemide  20 mg Take 1 tablet (20 mg total) by mouth daily as needed (for a weight over 190 lbs).   Labs: 05/01/2024 Creatinine 1.30, BUN 39, Potassium 4.8, Sodium 136, GFR 57 04/24/2024 Creatinine 1.39, BUN 43, Potassium 4.6, Sodium 138, GFR 53  04/17/2024 Creatinine 1.50, BUN 61, Potassium 5.1, Sodium 138, GFR 48  04/09/2024 Creatinine 1.20, BUN 33, Potassium 4.2, Sodium 138  03/21/2024 Creatinine 1.63, BUN 30, Potassium 4.4, Sodium 139, GFR 43 02/19/2024 Creatinine 1.75, BUN 30, Potassium 4.4, Sodium 139  A complete set of results can be found in  Results Review.   Recommendations:  Advised not to give PRN Fluid pills today or tomorrow since patient vomited Osmolite today, has diarrhea and not taking any fluid since 8 AM this morning.  He has not had his heart meds so far today due to nausea today.      Copy sent to Dr Rolan for review and recommendations.   Follow-up plan: ICM clinic phone appointment on 05/13/2024 to recheck fluid levels weekly d/t increased feeding tube fluid intake.   91 day device clinic remote transmission 07/22/2024.     EP/Cardiology Office Visits:  Recall 08/18/2024 with Dr. Rolan.   05/23/2024 with Dr Francyne.  Recall 06/18/2024 with EP APP.   Copy of ICM check sent to Dr. Inocencio.  3 month ICM trend: 05/06/2024.    12-14 Month ICM trend:     [name], RN 05/06/2024 7:11 AM

## 2024-05-06 NOTE — ED Provider Triage Note (Signed)
 Emergency Medicine Provider Triage Evaluation Note  Christian Bowman , a 78 y.o. male  was evaluated in triage.  Pt complains of diarrhea with mixed bright red blood in stool.  Diarrhea is black in color and tarry.  Today started having associated vomiting as well.  Noticed some amount of bright red blood in vomit.  He is on Eliquis  and being treated for cancer.  Review of Systems  Positive: Rectal bleeding Negative: CP  Physical Exam  BP (!) 100/55 (BP Location: Right Arm)   Pulse (!) 39   Temp 98.2 F (36.8 C)   Resp 14   Ht 6' (1.829 m)   Wt 86 kg   SpO2 94%   BMI 25.71 kg/m  Gen:   Awake, no distress   Resp:  Normal effort  MSK:   Moves extremities without difficulty  Other:    Medical Decision Making  Medically screening exam initiated at 4:34 PM.  Appropriate orders placed.  RIYAD KEENA was informed that the remainder of the evaluation will be completed by another provider, this initial triage assessment does not replace that evaluation, and the importance of remaining in the ED until their evaluation is complete.     Shermon Warren SAILOR, [place] 05/06/24 1635

## 2024-05-06 NOTE — Progress Notes (Signed)
 Oncology Nurse Navigator Documentation   I received a message from the radiation treatment machine that his wife called to cancel his radiation treatment today because he was not feeling well. I called and spoke to his wife regarding his symptoms. She reports that he vomited after receiving 2 cartons of osmolite this morning. He has had diarrhea today and she reported blood in his sputum and also when he wipes his bottom due to hemorrhoids. I did encourage her to have him come in today to see Dr. Shannon but she said that he was sleeping and would not agree to come today. She will encourage him to come tomorrow for treatment and MD visit. I offered to try to set up an appointment with symptom management today but she also declined the offer. I did instruct her to bring him to an Emergency Dept if his symptoms persisted or his diarrhea/vomiting was uncontrollable. She voiced her understanding.  Delon Jefferson RN, BSN, OCN Head & Neck Oncology Nurse Navigator [facility] Phone # [phone]  Fax # [phone]

## 2024-05-06 NOTE — H&P (Signed)
 History and Physical    Christian Bowman FMW:[id number] DOB: Feb 21, 1946 DOA: 05/06/2024  PCP: Pcp, No  Patient coming from: Home  I have personally briefly reviewed patient's old medical records in [facility]  Chief Complaint: Nausea, vomiting, diarrhea, rectal bleeding  HPI: Christian Bowman is a 78 y.o. male with medical history significant for tonsillar cancer on chemoradiation s/p G-tube, chronic HFrEF <20% w/ CHB s/p CRT-D, A-fib/flutter on Eliquis , CAD s/p redo CABG, s/p bioprosthetic MVR, history of CVA, CKD stage IIIa, T2DM, hypothyroidism who presented to the ED for evaluation of rectal bleeding.  History is supplemented by spouse at bedside as patient has difficulty speaking.  Patient is undergoing chemoradiation for primary tonsillar cancer diagnosed approximately 2 months ago.  He underwent IR guided gastrostomy tube placement on 04/09/2024.  His last chemotherapy treatment was 7/31 with reduced carboplatin  dose due to leukopenia and thrombocytopenia.  Over the last week patient has had significant functional decline with fatigue and generalized weakness.  He has been receiving Osmolite via tube for nutrition.  He is taking all medications by tube as well as he is not tolerating anything by mouth.  Patient developed nausea and vomiting today after receiving 2 cartons of Osmolite this morning.  He has been having diarrhea and today was seeing red blood in his stools.  He has had some intermittent abdominal pain.  He has been on Eliquis  with last dose taken 8/4 PM.  ED Course  Labs/Imaging on admission: I have personally reviewed following labs and imaging studies.  Initial vitals showed BP 100/55, pulse 39, RR 14, temp 98.2 F, SpO2 94% on room air.  Labs showed WBC 2.1, hemoglobin 13.0, platelets 59, sodium 134, potassium 5.4, bicarb 28, BUN 58, creatinine 1.59, serum glucose 188, AST 17, ALT 29, alk phos 52, total bilirubin 1.5.  FOBT is positive.  EDP discussed with on-call GI (Dr.  Suzann) who recommended medical admission and their team will see in consultation tomorrow.  The hospitalist service was consulted to admit.  Review of Systems: All systems reviewed and are negative except as documented in history of present illness above.   Past Medical History:  Diagnosis Date   AICD (automatic cardioverter/defibrillator) present    medtronic-   DR. CROITORU , DR. ROLAN    Anginal pain (HCC)    cp sat 08/11/17   Anxiety    CAD (coronary artery disease)    CHF (congestive heart failure) (HCC)    Complication of anesthesia    took awhile to wake up    Coronary artery disease involving coronary bypass graft    Cyst of neck    right side   DM2 (diabetes mellitus, type 2) (HCC) 08/26/2013   Dyspnea    Heart attack (HCC)    not sure when (08/20/2017)   HTN (hypertension) 08/26/2013   Hyperlipidemia 08/26/2013   Hypothyroidism    Left main coronary artery disease    Left renal artery stenosis (HCC)    Genesis 6x12 stent 2007   Localized cancer of throat (HCC)    Obesity (BMI 30.0-34.9) 08/26/2013   Postoperative atrial fibrillation (HCC) 10/15/2005   Presence of permanent cardiac pacemaker    S/P CABG x 4 10/13/2005   LIMA to LAD, SVG to intermediate branch, sequential SVG to PDA and RPL branch, EVH via right thigh   S/P mitral valve replacement with bioprosthetic valve 07/11/2016   31 mm [facility] Mitral bovine bioprosthetic tissue valve   S/P redo CABG x 2  07/11/2016   SVG to PDA and SVG to Intermediate Branch, EVH via left thigh    Past Surgical History:  Procedure Laterality Date   A-FLUTTER ABLATION N/A 05/11/2017   Procedure: A-Flutter Ablation;  Surgeon: Inocencio Soyla Lunger, MD;  Location: [facility] INVASIVE CV LAB;  Service: Cardiovascular;  Laterality: N/A;   BIV ICD GENERATOR CHANGEOUT N/A 08/29/2023   Procedure: BIV ICD GENERATOR CHANGEOUT;  Surgeon: Inocencio Soyla Lunger, MD;  Location: [facility] INVASIVE CV LAB;  Service: Cardiovascular;  Laterality:  N/A;   CARDIAC CATHETERIZATION N/A 06/21/2016   Procedure: Right/Left Heart Cath and Coronary/Graft Angiography;  Surgeon: Ozell Fell, MD;  Location: [facility] INVASIVE CV LAB;  Service: Cardiovascular;  Laterality: N/A;   CARDIAC VALVE REPLACEMENT     CARDIOVERSION N/A 07/19/2016   Procedure: CARDIOVERSION;  Surgeon: Redell GORMAN Shallow, MD;  Location: [facility] ENDOSCOPY;  Service: Cardiovascular;  Laterality: N/A;   CARDIOVERSION N/A 09/08/2016   Procedure: CARDIOVERSION;  Surgeon: Vina LULLA Gull, MD;  Location: [facility] ENDOSCOPY;  Service: Cardiovascular;  Laterality: N/A;   CORONARY ANGIOPLASTY     STENT 2016  [facility]   CORONARY ANGIOPLASTY WITH STENT PLACEMENT     DES in SVG to right coronary artery system   CORONARY ARTERY BYPASS GRAFT  10/13/2005   LIMA to LAD, SVG to intermediate branch, sequential SVG to PDA and RPL   CORONARY ARTERY BYPASS GRAFT N/A 07/11/2016   Procedure: REDO CORONARY ARTERY BYPASS GRAFTING (CABG) x two using left leg greater saphenous vein harvested endoscopically-SVG to PDA -SVG to RAMUS INTERMEDIATE;  Surgeon: Sudie VEAR Laine, MD;  Location: [facility] OR;  Service: Open Heart Surgery;  Laterality: N/A;   CORONARY ARTERY BYPASS GRAFT N/A 07/11/2016   Procedure: Re-exploration (CABG) for post op bleeding,;  Surgeon: Sudie VEAR Laine, MD;  Location: [facility] OR;  Service: Open Heart Surgery;  Laterality: N/A;   EP IMPLANTABLE DEVICE N/A 07/25/2016   Procedure: BiV ICD Insertion CRT-D;  Surgeon: Will Lunger Inocencio, MD;  Location: [facility] INVASIVE CV LAB;  Service: Cardiovascular;  Laterality: N/A;   INGUINAL HERNIA REPAIR Left 08/20/2017   INGUINAL HERNIA REPAIR Left 08/20/2017   Procedure: OPEN LEFT INGUINAL HERNIA REPAIR;  Surgeon: Ethyl Lenis, MD;  Location: [facility] OR;  Service: General;  Laterality: Left;   IR GASTROSTOMY TUBE MOD SED  04/09/2024   IR IMAGING GUIDED PORT INSERTION  04/09/2024   LEFT HEART CATH AND CORS/GRAFTS ANGIOGRAPHY N/A 12/18/2016   Procedure: Left Heart Cath and  Cors/Grafts Angiography;  Surgeon: Ozell Fell, MD;  Location: [facility] INVASIVE CV LAB;  Service: Cardiovascular;  Laterality: N/A;   LEFT HEART CATH AND CORS/GRAFTS ANGIOGRAPHY N/A 08/22/2017   Procedure: LEFT HEART CATH AND CORS/GRAFTS ANGIOGRAPHY;  Surgeon: Rolan Ezra GORMAN, MD;  Location: [facility] INVASIVE CV LAB;  Service: Cardiovascular;  Laterality: N/A;   MITRAL VALVE REPLACEMENT N/A 07/11/2016   Procedure: MITRAL VALVE (MV) REPLACEMENT;  Surgeon: Sudie VEAR Laine, MD;  Location: [facility] OR;  Service: Open Heart Surgery;  Laterality: N/A;   MYOCARDICAL PERFUSION  10/09/2007   NORMAL PERFUSION IN ALL REGIONS;NO EVIDENCE OF INDUCIBLE ISCHEMIA;POST STRESS EF% 66   RENAL ARTERY STENT Right 2007   RENAL DOPPLER  03/28/2010   RIGHT RA-NORMAL;LEFT PROXIMAL RA AT STENT-PATENT WITH NO EVIDENCE OF SIGN DIAMETER REDUCTION. R & L KIDNEYS: EQUAL IN SIZE,SYMMETRICAL IN SHAPE.   RIGHT HEART CATH N/A 01/08/2020   Procedure: RIGHT HEART CATH;  Surgeon: Rolan Ezra GORMAN, MD;  Location: [facility] INVASIVE CV LAB;  Service: Cardiovascular;  Laterality: N/A;  TEE WITHOUT CARDIOVERSION N/A 06/15/2016   Procedure: TRANSESOPHAGEAL ECHOCARDIOGRAM (TEE);  Surgeon: Jerel Balding, MD;  Location: [facility] ENDOSCOPY;  Service: Cardiovascular;  Laterality: N/A;   TEE WITHOUT CARDIOVERSION N/A 07/11/2016   Procedure: TRANSESOPHAGEAL ECHOCARDIOGRAM (TEE);  Surgeon: Sudie VEAR Laine, MD;  Location: [facility] OR;  Service: Open Heart Surgery;  Laterality: N/A;   TEE WITHOUT CARDIOVERSION N/A 07/19/2016   Procedure: TRANSESOPHAGEAL ECHOCARDIOGRAM (TEE);  Surgeon: Redell GORMAN Shallow, MD;  Location: [facility] ENDOSCOPY;  Service: Cardiovascular;  Laterality: N/A;   TRANSESOPHAGEAL ECHOCARDIOGRAM  10/19/2005   NORMAL LV; MILD TO MODERATE AMOUNT OF SOFT ATHEROMATOUS PLAQUE OF THE THORACIC AORTA; THE LEFT ATRIUM IS MILDLY DILATED;LEFT ATRIAL APPENDAGE FUNCTION IS NORMAL;NO THROMBUS IDENTIFIED. SMALL PFO WITH RIGHT TO LEFT SHUNT    Social History: Social History   Tobacco  Use   Smoking status: Never   Smokeless tobacco: Never  Vaping Use   Vaping status: Never Used  Substance Use Topics   Alcohol use: No   Drug use: No   Allergies  Allergen Reactions   Xanax [Alprazolam] Other (See Comments)    Pt feels very weak, tired and feels paralyzed      Family History  Problem Relation Age of Onset   Heart attack Mother    Heart disease Mother    Heart attack Father    Heart disease Father    Hypertension Father    Hypertension Sister    Heart disease Maternal Grandmother      Prior to Admission medications   Medication Sig Start Date End Date Taking? Authorizing Provider  acetaminophen  (TYLENOL ) 325 MG tablet Take 325 mg by mouth every 6 (six) hours as needed for mild pain (pain score 1-3).    [provider]  apixaban  (ELIQUIS ) 5 MG TABS tablet Take 1 tablet (5 mg total) by mouth 2 (two) times daily. 01/23/24   Croitoru, Mihai, MD  Blood Glucose Monitoring Suppl (ACCU-CHEK GUIDE ME) w/Device KIT 1 Piece by Does not apply route as directed. 02/01/22   Nida, Gebreselassie W, MD  carvedilol  (COREG ) 12.5 MG tablet TAKE 1 & 1/2 (ONE & ONE-HALF) TABLETS BY MOUTH TWICE DAILY WITH A MEAL 02/20/24   Rolan Ezra GORMAN, MD  Cholecalciferol  (VITAMIN D ) 50 MCG (2000 UT) tablet Take 2,000 Units by mouth daily with breakfast.    [provider]  dapagliflozin  propanediol (FARXIGA ) 10 MG TABS tablet Take 1 tablet (10 mg total) by mouth daily. 12/26/23   Croitoru, Mihai, MD  dexamethasone  (DECADRON ) 4 MG tablet Take 2 tablets daily for 2 days, start the day after chemotherapy. Take with food. 03/21/24   Pasam, Chinita, MD  digoxin  (LANOXIN ) 0.125 MG tablet Take 1/2 (one-half) tablet by mouth once daily 12/26/23   Croitoru, Mihai, MD  eplerenone  (INSPRA ) 50 MG tablet Take 1 tablet (50 mg total) by mouth daily. 06/11/23   Rolan Ezra GORMAN, MD  fluconazole  (DIFLUCAN ) 10 MG/ML suspension Take 20mL per tube today, then 10mL per tube daily for 20 more days. HOLD  ROSUVASTATIN  WHILE ON THIS. 04/28/24   Izell Domino, MD  furosemide  (LASIX ) 20 MG tablet Take 1 tablet (20 mg total) by mouth daily as needed (for a weight over 190 lbs). 12/26/23   Croitoru, Mihai, MD  glipiZIDE  (GLUCOTROL ) 5 MG tablet Take 1 tablet (5 mg total) by mouth daily before breakfast. Only one time daily at breakfast. 04/30/24   Nida, Ethelle ORN, MD  glucose blood (ACCU-CHEK GUIDE TEST) test strip Use to test blood glucose daily as instructed  03/17/24   Nida, Gebreselassie W, MD  guaiFENesin  300 MG/15ML LIQD Place 600 mg into feeding tube 4 (four) times daily as needed. 05/01/24   Boscia, Heather E, NP  levothyroxine  (SYNTHROID ) 50 MCG tablet Take 1 tablet (50 mcg total) by mouth daily before breakfast. 03/19/24   Nida, Gebreselassie W, MD  lidocaine  (XYLOCAINE ) 2 % solution Patient: Mix 1part 2% viscous lidocaine , 1part H20. Swish & swallow 10mL of diluted mixture, 30min before meals and at bedtime, up to QID 04/15/24   Izell Domino, MD  lidocaine -prilocaine  (EMLA ) cream Apply to affected area once 03/21/24   Pasam, Avinash, MD  neomycin-bacitracin-polymyxin (NEOSPORIN) ointment Apply 1 application  topically daily as needed for wound care.    [provider]  nitroGLYCERIN  (NITROSTAT ) 0.4 MG SL tablet DISSOLVE 1 TABLET UNDER THE TONGUE EVERY 5 MINUTES AS NEEDED FOR CHEST PAIN, DO NOT EXCEED 3 DOSES IN 15 MINUTES 12/26/23   Croitoru, Mihai, MD  Nutritional Supplements (FEEDING SUPPLEMENT, OSMOLITE 1.5 CAL,) LIQD Give 2 cartons at 8am, noon, 4pm bolus syringe and 1 carton at 8pm.  Flush with 60ml of water before and after each feeding.  Give additional 240ml TID between feedings for additional hydration. 04/28/24   Izell Domino, MD  ondansetron  (ZOFRAN ) 8 MG tablet Take 1 tablet (8 mg total) by mouth every 8 (eight) hours as needed for nausea or vomiting. Start on the third day after chemotherapy. 03/21/24   Pasam, Chinita, MD  oxyCODONE  (ROXICODONE ) 5 MG/5ML solution Place 5 mLs (5 mg  total) into feeding tube every 6 (six) hours as needed for severe pain (pain score 7-10). 05/01/24   Hanford Powell BRAVO, NP  polyethylene glycol (MIRALAX ) 17 g packet Take 17 g by mouth daily as needed for mild constipation or moderate constipation.    [provider]  prochlorperazine  (COMPAZINE ) 10 MG tablet Take 1 tablet (10 mg total) by mouth every 6 (six) hours as needed for nausea or vomiting. 03/21/24   Pasam, Avinash, MD  rosuvastatin  (CRESTOR ) 10 MG tablet Take 1 tablet (10 mg total) by mouth daily. 06/11/23   Rolan Ezra RAMAN, MD  sacubitril -valsartan  (ENTRESTO ) 97-103 MG Take 1 tablet by mouth twice daily 04/07/24   McLean, Dalton S, MD  sodium zirconium cyclosilicate  (LOKELMA ) 10 g PACK packet DISSOLVE 1 PACKET IN WATER & DRINK ONCE DAILY 09/24/23   Croitoru, Jerel, MD  Tetrahydrozoline HCl (VISINE OP) Place 1 drop into both eyes daily as needed (irritation).    [provider]    Physical Exam: Vitals:   05/06/24 1650 05/06/24 1651 05/06/24 1657 05/06/24 1700  BP: (!) 103/57   (!) 101/56  Pulse:  64 71 66  Resp: 20 14 20 20   Temp:      SpO2:  100% 100% 99%  Weight:      Height:       Constitutional: Resting in bed, appears fatigued.  NAD, calm Eyes: EOMI, lids and conjunctivae normal ENMT: Mucous membranes are moist. Posterior pharynx clear of any exudate or lesions. Neck: Large right submandibular mass.  Voice is hoarse. Respiratory: clear to auscultation bilaterally, no wheezing, no crackles. Normal respiratory effort. No accessory muscle use.  Cardiovascular: Regular rate and rhythm, no murmurs / rubs / gallops.  Trace bilateral lower extremity edema. 2+ pedal pulses. Abdomen: G-tube in place.  Insertion site without erythema or discharge.  Soft, no tenderness Musculoskeletal: no clubbing / cyanosis. No joint deformity upper and lower extremities. Good ROM, no contractures. Normal muscle tone.  Skin: no  rashes, lesions, ulcers. No induration Neurologic:  Sensation intact. Strength 5/5 in all 4.  Psychiatric: Normal judgment and insight. Alert and oriented x 3. Normal mood.   EKG: Personally reviewed. Atrial sensed V paced rhythm with bigeminy.  Similar prior.  Assessment/Plan Principal Problem:   BRBPR (bright red blood per rectum) Active Problems:   Hyperkalemia   Chronic heart failure with reduced ejection fraction (HFrEF, <= 40%) (HCC)   Coronary artery disease involving coronary bypass graft   Hypothyroidism (acquired)   Type 2 diabetes mellitus with stage 3a chronic kidney disease, without long-term current use of insulin  (HCC)   Primary cancer of tonsillar fossa (HCC)   Chronic kidney disease, stage 3a (HCC)   Thrombocytopenia (HCC)   Leukopenia due to antineoplastic chemotherapy (HCC)   History of CVA (cerebrovascular accident)   Christian Bowman is a 78 y.o. 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  Assessment and Plan: Bright red blood per rectum: Patient presenting with rectal bleeding in setting of thrombocytopenia and chronic anticoagulation with Eliquis .  Hemoglobin stable at 13.0.  FOBT positive. - Hold Eliquis  - Repeat CBC in a.m. - GI to consult in a.m.  Nausea, vomiting, diarrhea S/p gastrostomy tube: Potentially related to his chemotherapy.  He did not tolerate his tube feeds this morning.  He is strict n.p.o., taking all meds via tube. - Obtain KUB - Keep n.p.o., holding tube feeds for bowel rest tonight - IV antiemetics as needed  Hyperkalemia: Give Lokelma .  Hold Entresto , eplerenone , digoxin  for now.  Chronic HFrEF <20% s/p CRT-D: No respiratory symptoms at this time.  Noted to have peripheral edema on admission but with  ongoing GI losses and borderline hypotension, many of his cardiac meds will be on hold tonight. - Strict I/O's and daily weights - Check BNP - Holding Entresto , eplerenone , Farxiga  - Holding digoxin , check level - Continue Coreg  - Lasix  on hold for now  CKD stage IIIa: Renal function relatively stable.  Holding Entresto , eplerenone  as above.  Continue to monitor.  Leukopenia/thrombocytopenia: Likely chemotherapy induced.  Repeat CBC in AM.  Not meeting platelet transfusion threshold at this time.  History of A-fib/flutter: Paced rhythm on admission.  Eliquis  on hold as above.  Holding digoxin .  Continue Coreg .  CAD s/p CABG: Holding Eliquis  as above.  Rosuvastatin  has been on hold while he has been on fluconazole  for oral thrush.  Tonsillar cancer: On active chemoradiation.  Follows with oncology Dr. Autumn and rad/onc Dr. Izell.  Type 2 diabetes: Holding Farxiga  and glipizide  for now.  Placed on SSI q4h while NPO.  A1c 6.6% on 02/19/2024.  History of CVA: Eliquis  and statin on hold as above.  Hypothyroidism: Continue Synthroid .  Oral thrush: Continue fluconazole  per tube started as outpatient.   DVT prophylaxis: SCDs Start: 05/06/24 1953 Code Status: Full code, discussed with patient on admission Family Communication: Spouse at bedside Disposition Plan: From home, dispo pending clinical progress Consults called: GI Severity of Illness: The appropriate patient status for this patient is OBSERVATION. Observation status is judged to be reasonable and necessary in order to provide the required intensity of service to ensure the patient's safety. The patient's presenting symptoms, physical exam findings, and initial radiographic and laboratory data in the context of their medical  condition is felt to place them at decreased risk for further clinical deterioration. Furthermore, it is anticipated that the patient will be medically stable for discharge from the hospital within 2  midnights of admission.   Jorie Blanch MD [facility]  If 7PM-7AM, please contact night-coverage [email or web address]  05/06/2024, 8:03 PM

## 2024-05-06 NOTE — ED Provider Notes (Signed)
 [place] EMERGENCY DEPARTMENT AT [facility] Provider Note   CSN: [id number] Arrival date & time: 05/06/24  1528     Patient presents with: Rectal Bleeding   Christian Bowman is a 78 y.o. male with a past medical history of HFrEF, CAD, CABG, hypothyroidism, T2DM, CKD stage III, CVA, HLD, current tonsil cancer (on chemo once a week and radiation on M-F), chronic AC (on Eliquis ) presents to emergency department for evaluation of rectal bleeding.  Endorses 1 tablespoon of blood in bowel movements noted for the past 5 days.  Has been constipated on and off for a couple of years.  Was recommended to have MiraLAX  in July for constipation and has had loose bowel movements since.  Had 3 small loose BM today.  Endorses that he has dizziness, lightheadedness with position changes but this has been going on for years.   Also has had hemoptysis approximately twice a week within the last 2 weeks.  Reports that he coughs frequently secondary to heavy mucus production since starting radiation.  Was admitted in May 2025 for this.     Rectal Bleeding      Prior to Admission medications   Medication Sig Start Date End Date Taking? Authorizing Provider  acetaminophen  (TYLENOL ) 325 MG tablet Take 325 mg by mouth every 6 (six) hours as needed for mild pain (pain score 1-3).    [provider]  apixaban  (ELIQUIS ) 5 MG TABS tablet Take 1 tablet (5 mg total) by mouth 2 (two) times daily. 01/23/24   Croitoru, Mihai, MD  Blood Glucose Monitoring Suppl (ACCU-CHEK GUIDE ME) w/Device KIT 1 Piece by Does not apply route as directed. 02/01/22   Nida, Gebreselassie W, MD  carvedilol  (COREG ) 12.5 MG tablet TAKE 1 & 1/2 (ONE & ONE-HALF) TABLETS BY MOUTH TWICE DAILY WITH A MEAL 02/20/24   Rolan Ezra RAMAN, MD  Cholecalciferol  (VITAMIN D ) 50 MCG (2000 UT) tablet Take 2,000 Units by mouth daily with breakfast.    [provider]  dapagliflozin  propanediol (FARXIGA ) 10 MG TABS tablet Take 1 tablet  (10 mg total) by mouth daily. 12/26/23   Croitoru, Mihai, MD  dexamethasone  (DECADRON ) 4 MG tablet Take 2 tablets daily for 2 days, start the day after chemotherapy. Take with food. 03/21/24   Pasam, Chinita, MD  digoxin  (LANOXIN ) 0.125 MG tablet Take 1/2 (one-half) tablet by mouth once daily 12/26/23   Croitoru, Mihai, MD  eplerenone  (INSPRA ) 50 MG tablet Take 1 tablet (50 mg total) by mouth daily. 06/11/23   Rolan Ezra RAMAN, MD  fluconazole  (DIFLUCAN ) 10 MG/ML suspension Take 20mL per tube today, then 10mL per tube daily for 20 more days. HOLD ROSUVASTATIN  WHILE ON THIS. 04/28/24   Izell Domino, MD  furosemide  (LASIX ) 20 MG tablet Take 1 tablet (20 mg total) by mouth daily as needed (for a weight over 190 lbs). 12/26/23   Croitoru, Mihai, MD  glipiZIDE  (GLUCOTROL ) 5 MG tablet Take 1 tablet (5 mg total) by mouth daily before breakfast. Only one time daily at breakfast. 04/30/24   Nida, Ethelle ORN, MD  glucose blood (ACCU-CHEK GUIDE TEST) test strip Use to test blood glucose daily as instructed 03/17/24   Nida, Gebreselassie W, MD  guaiFENesin  300 MG/15ML LIQD Place 600 mg into feeding tube 4 (four) times daily as needed. 05/01/24   Boscia, Heather E, NP  levothyroxine  (SYNTHROID ) 50 MCG tablet Take 1 tablet (50 mcg total) by mouth daily before breakfast. 03/19/24   Nida, Gebreselassie W, MD  lidocaine  (  XYLOCAINE ) 2 % solution Patient: Mix 1part 2% viscous lidocaine , 1part H20. Swish & swallow 10mL of diluted mixture, 30min before meals and at bedtime, up to QID 04/15/24   Izell Domino, MD  lidocaine -prilocaine  (EMLA ) cream Apply to affected area once 03/21/24   Pasam, Avinash, MD  neomycin-bacitracin-polymyxin (NEOSPORIN) ointment Apply 1 application  topically daily as needed for wound care.    [provider]  nitroGLYCERIN  (NITROSTAT ) 0.4 MG SL tablet DISSOLVE 1 TABLET UNDER THE TONGUE EVERY 5 MINUTES AS NEEDED FOR CHEST PAIN, DO NOT EXCEED 3 DOSES IN 15 MINUTES 12/26/23   Croitoru, Mihai, MD   Nutritional Supplements (FEEDING SUPPLEMENT, OSMOLITE 1.5 CAL,) LIQD Give 2 cartons at 8am, noon, 4pm bolus syringe and 1 carton at 8pm.  Flush with 60ml of water before and after each feeding.  Give additional 240ml TID between feedings for additional hydration. 04/28/24   Izell Domino, MD  ondansetron  (ZOFRAN ) 8 MG tablet Take 1 tablet (8 mg total) by mouth every 8 (eight) hours as needed for nausea or vomiting. Start on the third day after chemotherapy. 03/21/24   Pasam, Chinita, MD  oxyCODONE  (ROXICODONE ) 5 MG/5ML solution Place 5 mLs (5 mg total) into feeding tube every 6 (six) hours as needed for severe pain (pain score 7-10). 05/01/24   Hanford Powell BRAVO, NP  polyethylene glycol (MIRALAX ) 17 g packet Take 17 g by mouth daily as needed for mild constipation or moderate constipation.    [provider]  prochlorperazine  (COMPAZINE ) 10 MG tablet Take 1 tablet (10 mg total) by mouth every 6 (six) hours as needed for nausea or vomiting. 03/21/24   Pasam, Avinash, MD  rosuvastatin  (CRESTOR ) 10 MG tablet Take 1 tablet (10 mg total) by mouth daily. 06/11/23   Rolan Ezra RAMAN, MD  sacubitril -valsartan  (ENTRESTO ) 97-103 MG Take 1 tablet by mouth twice daily 04/07/24   McLean, Dalton S, MD  sodium zirconium cyclosilicate  (LOKELMA ) 10 g PACK packet DISSOLVE 1 PACKET IN WATER & DRINK ONCE DAILY 09/24/23   Croitoru, Jerel, MD  Tetrahydrozoline HCl (VISINE OP) Place 1 drop into both eyes daily as needed (irritation).    [provider]    Allergies: Xanax [alprazolam]    Review of Systems  Gastrointestinal:  Positive for hematochezia.    Updated Vital Signs BP (!) 101/56   Pulse 66   Temp 98.2 F (36.8 C)   Resp 20   Ht 6' (1.829 m)   Wt 86 kg   SpO2 99%   BMI 25.71 kg/m   Physical Exam Vitals and nursing note reviewed.  Constitutional:      General: He is not in acute distress.    Appearance: Normal appearance.  HENT:     Head: Normocephalic and atraumatic.     Comments:  Right submandibular mass Eyes:     Conjunctiva/sclera: Conjunctivae normal.  Cardiovascular:     Rate and Rhythm: Normal rate.  Pulmonary:     Effort: Pulmonary effort is normal. No respiratory distress.     Breath sounds: Normal breath sounds.  Abdominal:     General: Bowel sounds are normal. There is no distension.     Palpations: Abdomen is soft.     Tenderness: There is no abdominal tenderness. There is no guarding or rebound.  Genitourinary:    Comments: Nonthrombosed non tender hemorrhoids. No gross blood nor melena on external exam. Small amount of brown stool obtained. Skin:    Coloration: Skin is not jaundiced or pale.  Comments: Pacemarker left anterior chest. Port right anterior chest. Feeding tube LUQ abdomen. All noninfectious appearing.  Neurological:     Mental Status: He is alert. Mental status is at baseline.   GU chaperoned by Christian Bowman  (all labs ordered are listed, but only abnormal results are displayed) Labs Reviewed  COMPREHENSIVE METABOLIC PANEL WITH GFR - Abnormal; Notable for the following components:      Result Value   Sodium 134 (*)    Potassium 5.4 (*)    Chloride 95 (*)    Glucose, Bld 188 (*)    BUN 58 (*)    Creatinine, Ser 1.59 (*)    Calcium  8.5 (*)    Total Protein 5.6 (*)    Albumin  2.9 (*)    Total Bilirubin 1.5 (*)    GFR, Estimated 45 (*)    All other components within normal limits  CBC WITH DIFFERENTIAL/PLATELET - Abnormal; Notable for the following components:   WBC 2.1 (*)    RBC 4.21 (*)    Platelets 59 (*)    Lymphs Abs 0.2 (*)    All other components within normal limits  POC OCCULT BLOOD, ED - Abnormal; Notable for the following components:   Fecal Occult Bld POSITIVE (*)    All other components within normal limits  TYPE AND SCREEN    EKG: None  Radiology: No results found.   Medications Ordered in the ED  ondansetron  (ZOFRAN ) injection 4 mg (has no administration in time range)                                     Medical Decision Making Amount and/or Complexity of Data Reviewed Labs: ordered.  Risk Decision regarding hospitalization.   Patient presents to the ED for concern of rectal bleeding, this involves an extensive number of treatment options, and is a complaint that carries with it a high risk of complications and morbidity.  The differential diagnosis includes GIB, external/internal hemorrhoids, neoplasm, polyp   Co morbidities that complicate the patient evaluation  Currently on chemo and radiation for tonsillar cancer Chronic AC with eliquis    Additional history obtained:  Additional history obtained from [facility], Nursing, Outside Medical Records, and Past Admission   External records from outside source obtained and reviewed including  triage RN note, family at bedside, admission from 01/2024   Lab Tests:  I Ordered, and personally interpreted labs.  The pertinent results include:   Fecal occult positive Hgb 13 PLT 59 (baseline 73-1 23 over past 2 months) Potassium 5.4 CBG 188 Creatinine 1.59 (baseline 1.2-1.75 over past 2 months Total bili 1.5     Cardiac Monitoring:  The patient was maintained on a cardiac monitor.  I personally viewed and interpreted the cardiac monitored which showed an underlying rhythm of: Paced rhythm at 75 bpm    Consultations Obtained:  I requested consultation with [facility],  and discussed lab and imaging findings as well as pertinent plan - they recommend: Hold eliquis  Will consult tomorrow I requested consultation with hospitalist Dr. Tobie,  and discussed lab and imaging findings as well as pertinent plan - accepts patient for admission   Problem List / ED Course:  Rectal bleeding No abd pain Some nausea, 2x vomiting but fairly routine since starting chemo, radiation Hemodynamically stable although does have some soft systolic pressures.  Reports that this has been typical for him since starting chemo, radiation.  No tachycardia. Hgb 13 No gross bleeding on rectal exam.  Internal rectal exam without gross stool, bleeding Consulted GI who will see him tomorrow during admission.  Will hold Eliquis  to avoid further bleeding, worsening anemia   Reevaluation:  After the interventions noted above, I reevaluated the patient and found that they have :stayed the same    Dispostion:  After consideration of the diagnostic results and the patients response to treatment, I feel that the patent would benefit from admission for rectal bleeding, GI consult.   Discussed ED workup, disposition, return to ED precautions with patient who expresses understanding agrees with plan.  All questions answered to their satisfaction.  They are agreeable to plan.  Discharge instructions provided on paperwork  Final diagnoses:  Rectal bleeding    ED Discharge Orders     None        Christian Tinnie BRAVO, PA 05/06/24 Christian Randol Simmonds, MD 05/07/24 [phone]

## 2024-05-06 NOTE — ED Triage Notes (Addendum)
 PT arrives via POV. Pt is currently undergoing radiation and chemo for for tonsillar cancer. PT reports he began having rectal bleeding for the past 3 days. Pt is AxOx4. C/o pain all over. Reports he does take eliquis .

## 2024-05-06 NOTE — ED Notes (Addendum)
 Pt brief changed.  Stool dry and no blood noted.  Pt buttocks and rectal area very red and sore.  Stage 2 ulcer forming between buttocks.

## 2024-05-07 ENCOUNTER — Ambulatory Visit

## 2024-05-07 ENCOUNTER — Encounter: Payer: Self-pay | Admitting: Oncology

## 2024-05-07 ENCOUNTER — Observation Stay (HOSPITAL_COMMUNITY)

## 2024-05-07 DIAGNOSIS — Z952 Presence of prosthetic heart valve: Secondary | ICD-10-CM | POA: Diagnosis not present

## 2024-05-07 DIAGNOSIS — I959 Hypotension, unspecified: Secondary | ICD-10-CM

## 2024-05-07 DIAGNOSIS — N1831 Chronic kidney disease, stage 3a: Secondary | ICD-10-CM | POA: Diagnosis not present

## 2024-05-07 DIAGNOSIS — T451X5A Adverse effect of antineoplastic and immunosuppressive drugs, initial encounter: Secondary | ICD-10-CM

## 2024-05-07 DIAGNOSIS — K625 Hemorrhage of anus and rectum: Secondary | ICD-10-CM

## 2024-05-07 DIAGNOSIS — D61818 Other pancytopenia: Secondary | ICD-10-CM | POA: Diagnosis not present

## 2024-05-07 DIAGNOSIS — E119 Type 2 diabetes mellitus without complications: Secondary | ICD-10-CM

## 2024-05-07 DIAGNOSIS — I5022 Chronic systolic (congestive) heart failure: Secondary | ICD-10-CM

## 2024-05-07 DIAGNOSIS — Z7901 Long term (current) use of anticoagulants: Secondary | ICD-10-CM

## 2024-05-07 DIAGNOSIS — C090 Malignant neoplasm of tonsillar fossa: Secondary | ICD-10-CM

## 2024-05-07 DIAGNOSIS — I25700 Atherosclerosis of coronary artery bypass graft(s), unspecified, with unstable angina pectoris: Secondary | ICD-10-CM

## 2024-05-07 DIAGNOSIS — I4892 Unspecified atrial flutter: Secondary | ICD-10-CM

## 2024-05-07 DIAGNOSIS — D709 Neutropenia, unspecified: Secondary | ICD-10-CM | POA: Diagnosis not present

## 2024-05-07 DIAGNOSIS — R339 Retention of urine, unspecified: Secondary | ICD-10-CM

## 2024-05-07 DIAGNOSIS — D696 Thrombocytopenia, unspecified: Secondary | ICD-10-CM

## 2024-05-07 LAB — COMPREHENSIVE METABOLIC PANEL WITH GFR
ALT: 26 U/L (ref 0–44)
ALT: 18 U/L (ref 0–44)
AST: 18 U/L (ref 15–41)
AST: 14 U/L — ABNORMAL LOW (ref 15–41)
Albumin: 2.8 g/dL — ABNORMAL LOW (ref 3.5–5.0)
Albumin: 2.1 g/dL — ABNORMAL LOW (ref 3.5–5.0)
Alkaline Phosphatase: 47 U/L (ref 38–126)
Alkaline Phosphatase: 36 U/L — ABNORMAL LOW (ref 38–126)
Anion gap: 10 (ref 5–15)
Anion gap: 9 (ref 5–15)
BUN: 48 mg/dL — ABNORMAL HIGH (ref 8–23)
BUN: 47 mg/dL — ABNORMAL HIGH (ref 8–23)
CO2: 29 mmol/L (ref 22–32)
CO2: 25 mmol/L (ref 22–32)
Calcium: 9 mg/dL (ref 8.9–10.3)
Calcium: 8.3 mg/dL — ABNORMAL LOW (ref 8.9–10.3)
Chloride: 96 mmol/L — ABNORMAL LOW (ref 98–111)
Chloride: 99 mmol/L (ref 98–111)
Creatinine, Ser: 1.48 mg/dL — ABNORMAL HIGH (ref 0.61–1.24)
Creatinine, Ser: 1.6 mg/dL — ABNORMAL HIGH (ref 0.61–1.24)
GFR, Estimated: 49 mL/min — ABNORMAL LOW (ref >60)
GFR, Estimated: 44 mL/min — ABNORMAL LOW (ref >60)
Glucose, Bld: 106 mg/dL — ABNORMAL HIGH (ref 70–99)
Glucose, Bld: 152 mg/dL — ABNORMAL HIGH (ref 70–99)
Potassium: 4.9 mmol/L (ref 3.5–5.1)
Potassium: 5 mmol/L (ref 3.5–5.1)
Sodium: 135 mmol/L (ref 135–145)
Sodium: 133 mmol/L — ABNORMAL LOW (ref 135–145)
Total Bilirubin: 1.8 mg/dL — ABNORMAL HIGH (ref 0.0–1.2)
Total Bilirubin: 2.3 mg/dL — ABNORMAL HIGH (ref 0.0–1.2)
Total Protein: 5.9 g/dL — ABNORMAL LOW (ref 6.5–8.1)
Total Protein: 4.6 g/dL — ABNORMAL LOW (ref 6.5–8.1)

## 2024-05-07 LAB — CBC WITH DIFFERENTIAL/PLATELET
Abs Immature Granulocytes: 0.02 K/uL (ref 0.00–0.07)
Basophils Absolute: 0 K/uL (ref 0.0–0.1)
Basophils Relative: 1 %
Eosinophils Absolute: 0 K/uL (ref 0.0–0.5)
Eosinophils Relative: 1 %
HCT: 38.7 % — ABNORMAL LOW (ref 39.0–52.0)
Hemoglobin: 13 g/dL (ref 13.0–17.0)
Immature Granulocytes: 1 %
Lymphocytes Relative: 8 %
Lymphs Abs: 0.1 K/uL — ABNORMAL LOW (ref 0.7–4.0)
MCH: 30.9 pg (ref 26.0–34.0)
MCHC: 33.6 g/dL (ref 30.0–36.0)
MCV: 91.9 fL (ref 80.0–100.0)
Monocytes Absolute: 0.1 K/uL (ref 0.1–1.0)
Monocytes Relative: 4 %
Neutro Abs: 1.4 K/uL — ABNORMAL LOW (ref 1.7–7.7)
Neutrophils Relative %: 85 %
Platelets: 56 K/uL — ABNORMAL LOW (ref 150–400)
RBC: 4.21 MIL/uL — ABNORMAL LOW (ref 4.22–5.81)
RDW: 12.3 % (ref 11.5–15.5)
WBC: 1.7 K/uL — ABNORMAL LOW (ref 4.0–10.5)
nRBC: 0 % (ref 0.0–0.2)

## 2024-05-07 LAB — DIGOXIN LEVEL: Digoxin Level: 0.4 ng/mL — ABNORMAL LOW (ref 0.8–2.0)

## 2024-05-07 LAB — GLUCOSE, CAPILLARY
Glucose-Capillary: 75 mg/dL (ref 70–99)
Glucose-Capillary: 124 mg/dL — ABNORMAL HIGH (ref 70–99)
Glucose-Capillary: 177 mg/dL — ABNORMAL HIGH (ref 70–99)
Glucose-Capillary: 146 mg/dL — ABNORMAL HIGH (ref 70–99)
Glucose-Capillary: 166 mg/dL — ABNORMAL HIGH (ref 70–99)
Glucose-Capillary: 173 mg/dL — ABNORMAL HIGH (ref 70–99)
Glucose-Capillary: 202 mg/dL — ABNORMAL HIGH (ref 70–99)

## 2024-05-07 LAB — HEMOGLOBIN AND HEMATOCRIT, BLOOD
HCT: 32.3 % — ABNORMAL LOW (ref 39.0–52.0)
Hemoglobin: 11 g/dL — ABNORMAL LOW (ref 13.0–17.0)

## 2024-05-07 LAB — PHOSPHORUS: Phosphorus: 2.5 mg/dL (ref 2.5–4.6)

## 2024-05-07 LAB — CORTISOL: Cortisol, Plasma: 54 ug/dL

## 2024-05-07 LAB — LACTIC ACID, PLASMA: Lactic Acid, Venous: 1.9 mmol/L (ref 0.5–1.9)

## 2024-05-07 LAB — MAGNESIUM: Magnesium: 1.8 mg/dL (ref 1.7–2.4)

## 2024-05-07 LAB — PROCALCITONIN: Procalcitonin: 2.79 ng/mL

## 2024-05-07 LAB — BRAIN NATRIURETIC PEPTIDE: B Natriuretic Peptide: 1837.4 pg/mL — ABNORMAL HIGH (ref 0.0–100.0)

## 2024-05-07 MED ORDER — VITAL HP 1.0 CAL PO LIQD
1000.0000 mL | ORAL | Status: DC
  Filled 2024-05-07: qty 1000

## 2024-05-07 MED ORDER — SODIUM CHLORIDE 0.9% FLUSH: 10.0000 mL | INTRAVENOUS | Status: DC | PRN

## 2024-05-07 MED ORDER — SODIUM CHLORIDE 0.9% FLUSH
10.0000 mL | Freq: Two times a day (BID) | INTRAVENOUS | Status: DC
  Administered 2024-05-07 – 2024-05-19 (×16): 10 mL

## 2024-05-07 MED ORDER — CARVEDILOL 6.25 MG PO TABS: 6.2500 mg | ORAL_TABLET | Freq: Two times a day (BID) | ORAL | Status: DC

## 2024-05-07 MED ORDER — LOPERAMIDE HCL 1 MG/7.5ML PO SUSP
2.0000 mg | Freq: Every day | ORAL | Status: DC
  Administered 2024-05-07: 2 mg
  Filled 2024-05-07 (×2): qty 15

## 2024-05-07 MED ORDER — PIPERACILLIN-TAZOBACTAM 3.375 G IVPB
3.3750 g | Freq: Three times a day (TID) | INTRAVENOUS | Status: DC
  Administered 2024-05-08 – 2024-05-14 (×26): 3.375 g via INTRAVENOUS
  Filled 2024-05-07 (×19): qty 50

## 2024-05-07 MED ORDER — LINEZOLID 600 MG/300ML IV SOLN
600.0000 mg | Freq: Two times a day (BID) | INTRAVENOUS | Status: DC
  Administered 2024-05-07: 600 mg via INTRAVENOUS
  Filled 2024-05-07 (×4): qty 300

## 2024-05-07 MED ORDER — SODIUM CHLORIDE 0.9 % IV BOLUS
250.0000 mL | Freq: Once | INTRAVENOUS | Status: AC
  Administered 2024-05-07: 250 mL via INTRAVENOUS

## 2024-05-07 MED ORDER — MIDODRINE HCL 5 MG PO TABS
10.0000 mg | ORAL_TABLET | Freq: Three times a day (TID) | ORAL | Status: DC
  Administered 2024-05-07 – 2024-05-10 (×10): 10 mg
  Filled 2024-05-07 (×10): qty 2

## 2024-05-07 MED ORDER — TAMSULOSIN HCL 0.4 MG PO CAPS
0.4000 mg | ORAL_CAPSULE | Freq: Every day | ORAL | Status: DC
  Administered 2024-05-07: 0.4 mg via ORAL
  Filled 2024-05-07: qty 1

## 2024-05-07 MED ORDER — OSMOLITE 1.5 CAL PO LIQD
1000.0000 mL | ORAL | Status: AC
  Administered 2024-05-07 – 2024-05-09 (×2): 1000 mL
  Filled 2024-05-07: qty 1000

## 2024-05-07 MED ORDER — SODIUM CHLORIDE 0.9 % IV SOLN
250.0000 mL | INTRAVENOUS | Status: DC
  Administered 2024-05-07: 250 mL via INTRAVENOUS

## 2024-05-07 MED ORDER — ALBUMIN HUMAN 25 % IV SOLN
50.0000 g | Freq: Once | INTRAVENOUS | Status: AC
  Administered 2024-05-07 (×2): 50 g via INTRAVENOUS
  Filled 2024-05-07: qty 200

## 2024-05-07 MED ORDER — PROSOURCE TF20 ENFIT COMPATIBL EN LIQD
60.0000 mL | Freq: Every day | ENTERAL | Status: AC
  Administered 2024-05-07 – 2024-05-09 (×3): 60 mL
  Filled 2024-05-07 (×2): qty 60

## 2024-05-07 MED ORDER — FLUCONAZOLE 100 MG PO TABS
100.0000 mg | ORAL_TABLET | Freq: Every day | ORAL | Status: AC
  Administered 2024-05-07 – 2024-05-17 (×14): 100 mg
  Filled 2024-05-07 (×11): qty 1

## 2024-05-07 MED ORDER — ACETAMINOPHEN 325 MG PO TABS
650.0000 mg | ORAL_TABLET | Freq: Four times a day (QID) | ORAL | Status: DC | PRN
  Administered 2024-05-07 – 2024-05-11 (×3): 650 mg
  Filled 2024-05-07 (×3): qty 2

## 2024-05-07 MED ORDER — PIPERACILLIN-TAZOBACTAM 3.375 G IVPB 30 MIN
3.3750 g | Freq: Once | INTRAVENOUS | Status: AC
  Administered 2024-05-07: 3.375 g via INTRAVENOUS
  Filled 2024-05-07: qty 50

## 2024-05-07 MED ORDER — ACETAMINOPHEN 650 MG RE SUPP: 650.0000 mg | Freq: Four times a day (QID) | RECTAL | Status: DC | PRN

## 2024-05-07 MED ORDER — ROSUVASTATIN CALCIUM 5 MG PO TABS
10.0000 mg | ORAL_TABLET | Freq: Every day | ORAL | Status: DC
  Administered 2024-05-08 – 2024-05-19 (×15): 10 mg
  Filled 2024-05-07: qty 2
  Filled 2024-05-07: qty 1
  Filled 2024-05-07: qty 2
  Filled 2024-05-07 (×5): qty 1
  Filled 2024-05-07: qty 2
  Filled 2024-05-07: qty 1
  Filled 2024-05-07: qty 2
  Filled 2024-05-07 (×2): qty 1

## 2024-05-07 MED ORDER — CHLORHEXIDINE GLUCONATE CLOTH 2 % EX PADS
6.0000 | MEDICATED_PAD | Freq: Every day | CUTANEOUS | Status: DC
  Administered 2024-05-07 – 2024-05-19 (×17): 6 via TOPICAL

## 2024-05-07 MED ORDER — DIGOXIN 0.0625 MG HALF TABLET
0.0625 mg | ORAL_TABLET | Freq: Every day | ORAL | Status: DC
  Administered 2024-05-07 – 2024-05-08 (×2): 0.0625 mg
  Filled 2024-05-07 (×3): qty 1

## 2024-05-07 MED ORDER — NOREPINEPHRINE 4 MG/250ML-% IV SOLN
0.0000 ug/min | INTRAVENOUS | Status: DC
  Administered 2024-05-07: 1 ug/min via INTRAVENOUS
  Administered 2024-05-08: 7 ug/min via INTRAVENOUS
  Filled 2024-05-07 (×2): qty 250

## 2024-05-07 MED ORDER — LACTATED RINGERS IV BOLUS
1000.0000 mL | Freq: Once | INTRAVENOUS | Status: AC
  Administered 2024-05-07: 1000 mL via INTRAVENOUS

## 2024-05-07 MED ORDER — HYDROCORTISONE ACETATE 25 MG RE SUPP
25.0000 mg | Freq: Two times a day (BID) | RECTAL | Status: DC
  Administered 2024-05-07 (×2): 25 mg via RECTAL
  Filled 2024-05-07 (×4): qty 1

## 2024-05-07 MED ORDER — SODIUM CHLORIDE 0.9 % IV BOLUS
500.0000 mL | Freq: Once | INTRAVENOUS | Status: AC
  Administered 2024-05-07: 500 mL via INTRAVENOUS

## 2024-05-07 NOTE — Care Management Obs Status (Signed)
 MEDICARE OBSERVATION STATUS NOTIFICATION   Patient Details  Name: Christian Bowman MRN: [id number] Date of Birth: 21-Nov-1945   Medicare Observation Status Notification Given:  Yes  Obs letter signed and copy given  Claretta Deed 05/07/2024, 1:09 PM

## 2024-05-07 NOTE — Progress Notes (Deleted)
 Patient Care Team: Pcp, No as PCP - General Rolan Ezra RAMAN, MD as PCP - Cardiology (Cardiology) Inocencio Soyla Lunger, MD as PCP - Electrophysiology (Cardiology) Skeet Juliene SAUNDERS, DO as Consulting Physician (Neurology) Okey Burns, MD as Consulting Physician (Otolaryngology) Izell Domino, MD as Consulting Physician (Radiation Oncology) Autumn Millman, MD as Consulting Physician (Oncology) Malmfelt, Delon CROME, RN as Oncology Nurse Navigator  Clinic Day:  05/07/2024  Referring physician: Autumn Millman, MD  ASSESSMENT & PLAN:   Assessment & Plan: Primary cancer of tonsillar fossa [facility]) Please review oncology history for additional details and timeline of events.   cT3, cN0, cM0, p16 positive disease, stage II. Patient had consultation with Dr. Floretta, otolaryngology at [facility] on 03/19/2024.  Given his cardiac history, he was felt to be a high risk candidate for surgery and hence chemoradiation was recommended. Given his CKD, he is not a candidate for cisplatin.  Hence plan made to proceed with weekly carboplatin  and paclitaxel  during the course of radiation. Monitoring of kidney function and blood counts is essential to adjust chemotherapy dosing and manage potential side effects such as nausea, vomiting, and fatigue. Anti-nausea medications are provided to manage these effects.  If further renal dysfunction is noted, chemotherapy will be adjusted or skipped.  He started concurrent chemoradiation with weekly carboplatin  and paclitaxel  from 04/10/2024. He has experience fatigue.  Labs today reveal creatinine of 1.39, closer to his baseline.  Platelet count was slightly decreased predating the chemotherapy initiation.  Platelet count is 97,000 today.  Plan is to continue chemotherapy as long as platelet count is above 50,000. Given fatigue symptoms, and to avoid further thrombocytopenia, we will dose reduce carboplatin  to AUC 1.5 and proceed with cycle 2 of chemotherapy today with  carboplatin  and paclitaxel . - 05/01/2024  - Due to worsening fatigue, weakness, and thrombocytopenia, carboplatin  reduced to AUC 1 on cycle 4.  Present with Taxol  45 mg/m.  New chest x-ray indicated for acute cardiopulmonary disease.   continue radiation daily. Return in 1 week for labs/flush, follow-up, and cycle 5 of carboplatin  and Taxol .      The patient understands the plans discussed today and is in agreement with them.  He knows to contact our office if he develops concerns prior to his next appointment.  I provided *** minutes of face-to-face time during this encounter and > 50% was spent counseling as documented under my assessment and plan.    Powell FORBES Lessen, NP  [facility] - A DEPT OF [facility] [address] [place] [address] Dept: [phone] Dept Fax: [phone]   No orders of the defined types were placed in this encounter.     CHIEF COMPLAINT:  CC: cancer of tonsillar fossa   Current Treatment:  concurrent chemoradiation with carboplatin  and paclitaxol  INTERVAL HISTORY:  Christian Bowman is here today for repeat clinical assessment. He was last seen by me on 05/01/2024. Admitted to hospital 05/06/2024 due to bright red blood per rectum. He denies fevers or chills. He denies pain. His appetite is good. His weight {Weight change:10426}.  I have reviewed the past medical history, past surgical history, social history and family history with the patient and they are unchanged from previous note.  ALLERGIES:  is allergic to xanax [alprazolam].  MEDICATIONS:  No current facility-administered medications for this visit.   No current outpatient medications on file.   Facility-Administered Medications Ordered in Other Visits  Medication Dose Route Frequency  Provider Last Rate Last Admin   0.9 %  sodium chloride  infusion  250 mL Intravenous Continuous Harris, Whitney D, NP       acetaminophen  (TYLENOL )  tablet 650 mg  650 mg Per Tube Q6H PRN Ghimire, Donalda HERO, MD       Or   acetaminophen  (TYLENOL ) suppository 650 mg  650 mg Rectal Q6H PRN Ghimire, Donalda HERO, MD       Chlorhexidine  Gluconate Cloth 2 % PADS 6 each  6 each Topical Daily Raenelle Donalda HERO, MD   6 each at 05/07/24 1021   digoxin  (LANOXIN ) tablet 0.0625 mg  0.0625 mg Per Tube Daily Ghimire, Shanker M, MD   0.0625 mg at 05/07/24 1237   feeding supplement (OSMOLITE 1.5 CAL) liquid 1,000 mL  1,000 mL Per Tube Continuous Raenelle Donalda HERO, MD 20 mL/hr at 05/07/24 1734 1,000 mL at 05/07/24 1734   feeding supplement (PROSource TF20) liquid 60 mL  60 mL Per Tube Daily Raenelle Donalda HERO, MD   60 mL at 05/07/24 1734   fluconazole  (DIFLUCAN ) tablet 100 mg  100 mg Per Tube Daily Patel, Vishal R, MD   100 mg at 05/07/24 1021   hydrocortisone  (ANUSOL -HC) suppository 25 mg  25 mg Rectal BID Kerman Vina HERO, NP   25 mg at 05/07/24 1238   insulin  aspart (novoLOG ) injection 0-9 Units  0-9 Units Subcutaneous Q4H Patel, Vishal R, MD   2 Units at 05/07/24 1353   levothyroxine  (SYNTHROID ) tablet 50 mcg  50 mcg Per Tube QAC breakfast Tobie Jorie SAUNDERS, MD   50 mcg at 05/07/24 9360   loperamide  HCl (IMODIUM ) 1 MG/7.5ML suspension 2 mg  2 mg Per Tube Daily Kerman Vina HERO, NP   2 mg at 05/07/24 1236   midodrine  (PROAMATINE ) tablet 10 mg  10 mg Per Tube TID WC Raenelle Donalda HERO, MD   10 mg at 05/07/24 1451   norepinephrine  (LEVOPHED ) 4mg  in (0.016 mg/mL) premix infusion  0-10 mcg/min Intravenous Titrated Harris, Whitney D, NP       ondansetron  (ZOFRAN ) injection 4 mg  4 mg Intravenous Q6H PRN Tobie Jorie SAUNDERS, MD       oxyCODONE  (ROXICODONE ) 5 MG/5ML solution 5 mg  5 mg Per Tube Q6H PRN Patel, Vishal R, MD   5 mg at 05/07/24 1020   rosuvastatin  (CRESTOR ) tablet 10 mg  10 mg Per Tube Daily Ghimire, Donalda HERO, MD       senna-docusate (Senokot-S) tablet 1 tablet  1 tablet Oral QHS PRN Tobie Jorie SAUNDERS, MD       sodium chloride  flush (NS) 0.9 % injection  10-40 mL  10-40 mL Intracatheter Q12H Ghimire, Donalda HERO, MD   10 mL at 05/07/24 1021   sodium chloride  flush (NS) 0.9 % injection 10-40 mL  10-40 mL Intracatheter PRN Raenelle Donalda HERO, MD       sodium chloride  flush (NS) 0.9 % injection 3 mL  3 mL Intravenous Q12H Tobie Jorie R, MD   3 mL at 05/07/24 1021    HISTORY OF PRESENT ILLNESS:   Oncology History  Primary cancer of tonsillar fossa (HCC)  03/04/2024 Initial Diagnosis   Primary cancer of tonsillar fossa (HCC)   03/04/2024 Cancer Staging   Staging form: Pharynx - HPV-Mediated Oropharynx, AJCC 8th Edition - Clinical stage from 03/04/2024: Stage II (cT3, cN0, cM0, p16+) - Signed by Autumn Millman, MD on 03/21/2024 Stage prefix: Initial diagnosis   04/10/2024 -  Chemotherapy   Patient is on  Treatment Plan : HEAD/NECK Carboplatin  + Paclitaxel  + XRT q7d         REVIEW OF SYSTEMS:   Constitutional: Denies fevers, chills or abnormal weight loss Eyes: Denies blurriness of vision Ears, nose, mouth, throat, and face: Denies mucositis or sore throat Respiratory: Denies cough, dyspnea or wheezes Cardiovascular: Denies palpitation, chest discomfort or lower extremity swelling Gastrointestinal:  Denies nausea, heartburn or change in bowel habits Skin: Denies abnormal skin rashes Lymphatics: Denies new lymphadenopathy or easy bruising Neurological:Denies numbness, tingling or new weaknesses Behavioral/Psych: Mood is stable, no new changes  All other systems were reviewed with the patient and are negative.   VITALS:  There were no vitals taken for this visit.  Wt Readings from Last 3 Encounters:  05/06/24 189 lb 9.5 oz (86 kg)  05/01/24 189 lb 12.8 oz (86.1 kg)  04/24/24 191 lb 14.4 oz (87 kg)    There is no height or weight on file to calculate BMI.  Performance status (ECOG): {CHL ONC [id number]  PHYSICAL EXAM:   GENERAL:alert, no distress and comfortable SKIN: skin color, texture, turgor are normal, no rashes or  significant lesions EYES: normal, Conjunctiva are pink and non-injected, sclera clear OROPHARYNX:no exudate, no erythema and lips, buccal mucosa, and tongue normal  NECK: supple, thyroid  normal size, non-tender, without nodularity LYMPH:  no palpable lymphadenopathy in the cervical, axillary or inguinal LUNGS: clear to auscultation and percussion with normal breathing effort HEART: regular rate & rhythm and no murmurs and no lower extremity edema ABDOMEN:abdomen soft, non-tender and normal bowel sounds Musculoskeletal:no cyanosis of digits and no clubbing  NEURO: alert & oriented x 3 with fluent speech, no focal motor/sensory deficits  LABORATORY DATA:  I have reviewed the data as listed    Component Value Date/Time   NA 133 (L) 05/07/2024 1609   NA 140 08/21/2023 1307   K 5.0 05/07/2024 1609   CL 99 05/07/2024 1609   CO2 25 05/07/2024 1609   GLUCOSE 152 (H) 05/07/2024 1609   BUN 47 (H) 05/07/2024 1609   BUN 37 (H) 08/21/2023 1307   CREATININE 1.60 (H) 05/07/2024 1609   CREATININE 1.30 (H) 05/01/2024 1026   CALCIUM  8.3 (L) 05/07/2024 1609   PROT 4.6 (L) 05/07/2024 1609   PROT 6.8 08/21/2023 1306   ALBUMIN  2.1 (L) 05/07/2024 1609   ALBUMIN  4.3 08/21/2023 1306   AST 14 (L) 05/07/2024 1609   AST 9 (L) 05/01/2024 1026   ALT 18 05/07/2024 1609   ALT 14 05/01/2024 1026   ALKPHOS 36 (L) 05/07/2024 1609   BILITOT 2.3 (H) 05/07/2024 1609   BILITOT 1.0 05/01/2024 1026   GFRNONAA 44 (L) 05/07/2024 1609   GFRNONAA 57 (L) 05/01/2024 1026   GFRAA 46 (L) 09/22/2020 0859    No results found for: SPEP, UPEP  Lab Results  Component Value Date   WBC 1.7 (L) 05/07/2024   NEUTROABS 1.4 (L) 05/07/2024   HGB 11.0 (L) 05/07/2024   HCT 32.3 (L) 05/07/2024   MCV 91.9 05/07/2024   PLT 56 (L) 05/07/2024      Chemistry      Component Value Date/Time   NA 133 (L) 05/07/2024 1609   NA 140 08/21/2023 1307   K 5.0 05/07/2024 1609   CL 99 05/07/2024 1609   CO2 25 05/07/2024 1609   BUN  47 (H) 05/07/2024 1609   BUN 37 (H) 08/21/2023 1307   CREATININE 1.60 (H) 05/07/2024 1609   CREATININE 1.30 (H) 05/01/2024 1026  Component Value Date/Time   CALCIUM  8.3 (L) 05/07/2024 1609   ALKPHOS 36 (L) 05/07/2024 1609   AST 14 (L) 05/07/2024 1609   AST 9 (L) 05/01/2024 1026   ALT 18 05/07/2024 1609   ALT 14 05/01/2024 1026   BILITOT 2.3 (H) 05/07/2024 1609   BILITOT 1.0 05/01/2024 1026       RADIOGRAPHIC STUDIES: I have personally reviewed the radiological images as listed and agreed with the findings in the report. DG Chest Port 1V same Day Result Date: 05/07/2024 CLINICAL DATA:  Hypotension. EXAM: PORTABLE CHEST 1 VIEW COMPARISON:  May 01, 2024. FINDINGS: Stable cardiomediastinal silhouette. Status post coronary artery bypass graft as well as mitral valve repair. Left-sided defibrillator is unchanged. Right sided Port-A-Cath is unchanged. Lungs are clear. Bony thorax is unremarkable. IMPRESSION: No active disease. Electronically Signed   By: Lynwood Landy Raddle M.D.   On: 05/07/2024 17:19   Abd 1 View (KUB) Result Date: 05/06/2024 CLINICAL DATA:  Nausea vomiting and diarrhea. EXAM: ABDOMEN - 1 VIEW COMPARISON:  None Available. FINDINGS: Percutaneous gastrostomy with balloon over the epigastric area. No bowel dilatation or evidence of obstruction. No free air. Osteopenia with degenerative changes of the spine. No acute osseous pathology. IMPRESSION: Nonobstructive bowel gas pattern. Electronically Signed   By: Vanetta Chou M.D.   On: 05/06/2024 20:42   DG Chest 2 View Result Date: 05/01/2024 CLINICAL DATA:  Hemoptysis, congestion. EXAM: CHEST - 2 VIEW COMPARISON:  Feb 18, 2024. FINDINGS: Stable cardiomegaly. Status post mitral valve repair as well as coronary artery bypass graft. Left-sided defibrillator is unchanged. Right internal jugular Port-A-Cath is noted with distal tip in expected position of cavoatrial junction. No acute pulmonary disease is noted. Bony thorax is  unremarkable. IMPRESSION: No active cardiopulmonary disease. Aortic Atherosclerosis (ICD10-I70.0). Electronically Signed   By: Lynwood Landy Raddle M.D.   On: 05/01/2024 17:00   CUP PACEART REMOTE DEVICE CHECK Result Date: 04/23/2024 ICD Scheduled remote reviewed. Normal device function.  Presenting rhythm: AP-AS/BiVP with bigeminy PACs. 1 VT-NS classified episode on 03/13/24 at 00:37, 15 beats, NSVT at 220 bpm. Next remote 91 days. MC, CVRS  IR GASTROSTOMY TUBE MOD SED Result Date: 04/09/2024 INDICATION: he will receive chemo/radiation for head and neck cancer EXAM: Procedures: 1. FLUOROSCOPIC NASOGASTRIC TUBE PLACEMENT 2. PERCUTANEOUS GASTROSTOMY FEEDING TUBE PLACEMENT COMPARISON:  PET-CT, 02/28/2024.  CT chest, 02/18/2024. MEDICATIONS: Ancef  2 gm IV; Antibiotics were administered within 1 hour of the procedure. 1 mg glucagon  IV. CONTRAST:  20 mL of Isovue  300 administered into the gastric lumen. ANESTHESIA/SEDATION: Moderate (conscious) sedation was employed during this procedure. A total of Versed  1 mg and Fentanyl  50 mcg was administered intravenously. Moderate Sedation Time: 13 minutes. The patient's level of consciousness and vital signs were monitored continuously by radiology nursing throughout the procedure under my direct supervision. FLUOROSCOPY: Radiation Exposure Index and estimated peak skin dose (PSD); Reference air kerma (RAK), 6 mGy. COMPLICATIONS: None immediate. PROCEDURE: Informed written consent was obtained from the patient and/or patient's representative following explanation of the procedure, risks, benefits and alternatives. A time out was performed prior to the initiation of the procedure. Maximal barrier sterile technique utilized including caps, mask, sterile gowns, sterile gloves, large sterile drape, hand hygiene and sterile prep. The LEFT upper quadrant was sterilely prepped and draped. An oral gastric catheter was inserted into the stomach under fluoroscopy. The LEFT costal margin and  barium opacified transverse colon were identified and avoided. Air was injected into the stomach for insufflation and visualization under  fluoroscopy. Under sterile conditions and local anesthesia, 2 T tacks were utilized to pexy the anterior aspect of the stomach against the ventral abdominal wall. Contrast injection confirmed appropriate positioning of each of the T tacks. An incision was made between the T tacks and a 17 gauge trocar needle was utilized to access the stomach. Needle position was confirmed within the stomach with aspiration of air and injection of a small amount of contrast. A stiff guidewire was advanced into the gastric lumen and under intermittent fluoroscopic guidance, the access needle was exchanged for a telescoping peel-away sheath, ultimately allowing placement of a 16 Fr balloon retention gastrostomy tube. The retention balloon was insufflated with a mixture of dilute saline and contrast and pulled taut against the anterior wall of the stomach. The external disc was cinched. Contrast injection confirms positioning within the stomach. Several spot radiographic images were obtained in various obliquities for documentation. The patient tolerated procedure well without immediate post procedural complication. FINDINGS: After successful fluoroscopic guided placement, the gastrostomy tube is appropriately positioned with internal retention balloon against the ventral aspect of the gastric lumen. IMPRESSION: Successful fluoroscopic insertion of a 16 Fr balloon retention gastrostomy tube. The gastrostomy may be used immediately for medication administration and in 4 hrs for the initiation of feeds. RECOMMENDATIONS: The patient will return to Vascular Interventional Radiology (VIR) for routine feeding tube evaluation and exchange in 6 months. Thom Hall, MD [facility] Radiology Electronically Signed   By: Thom Hall M.D.   On: 04/09/2024 16:10    IR IMAGING GUIDED PORT INSERTION Result Date: 04/09/2024 INDICATION: he will receive chemo for head and neck cancer EXAM: IMPLANTED PORT A CATH PLACEMENT WITH ULTRASOUND AND FLUOROSCOPIC GUIDANCE MEDICATIONS: None ANESTHESIA/SEDATION: Moderate (conscious) sedation was employed during this procedure. A total of Versed  1 mg and Fentanyl  50 mcg was administered intravenously. Moderate Sedation Time: 21 minutes. The patient's level of consciousness and vital signs were monitored continuously by radiology nursing throughout the procedure under my direct supervision. FLUOROSCOPY: Radiation Exposure Index and estimated peak skin dose (PSD); Reference air kerma (RAK), 1 mGy. COMPLICATIONS: None immediate. PROCEDURE: The procedure, risks, benefits, and alternatives were explained to the patient. Questions regarding the procedure were encouraged and answered. The patient understands and consents to the procedure. The neck and chest were prepped with chlorhexidine  in a sterile fashion, and a sterile drape was applied covering the operative field. Maximum barrier sterile technique with sterile gowns and gloves were used for the procedure. A timeout was performed prior to the initiation of the procedure. Local anesthesia was provided with 1% lidocaine  with epinephrine . After creating a small venotomy incision, a micropuncture kit was utilized to access the internal jugular vein under direct, real-time ultrasound guidance. Ultrasound image documentation was performed. The microwire was kinked to measure appropriate catheter length. A subcutaneous port pocket was then created along the upper chest wall utilizing a combination of sharp and blunt dissection. The pocket was irrigated with sterile saline. A single lumen power injectable port was chosen for placement. The 8 Fr catheter was tunneled from the port pocket site to the venotomy incision. The port was placed in the pocket. The external catheter was trimmed to appropriate  length. At the venotomy, an 8 Fr peel-away sheath was placed over a guidewire under fluoroscopic guidance. The catheter was then placed through the sheath and the sheath was removed. Final catheter positioning was confirmed and documented with a fluoroscopic spot radiograph. The port was accessed with a  Huber needle, aspirated and flushed with heparinized saline. The port pocket incision was closed with interrupted 3-0 Vicryl suture then Dermabond was applied, including at the venotomy incision. Dressings were placed. The patient tolerated the procedure well without immediate post procedural complication. IMPRESSION: Successful placement of a RIGHT internal jugular approach power injectable Port-A-Cath. The tip of the catheter is positioned within the proximal RIGHT atrium. The catheter is ready for immediate use. Thom Hall, MD [facility] Radiology Electronically Signed   By: Thom Hall M.D.   On: 04/09/2024 16:05

## 2024-05-07 NOTE — Progress Notes (Signed)
 Oncology Nurse Navigator Documentation   I received a VM from Mrs. Bowersox this morning informing me that they went to [facility] yesterday afternoon for rectal bleeding with diarrhea along with vomiting. It appears that he has been admitted to [facility]. I called and spoke very briefly to Mrs. Neidhardt and offered by support. I told her that we would follow is progress in the hospital. I will notify Dr. Shannon who is covering for Dr. Izell while she is off this week.   Delon Jefferson RN, BSN, OCN Head & Neck Oncology Nurse Navigator  Can[facility] Center at [facility] Phone # [phone]  Fax # [phone]

## 2024-05-07 NOTE — Progress Notes (Addendum)
 Informed by RN earlier-patient hypotensive Evaluated patient at bedside-hypotensive but asymptomatic-mentating well-no dizziness-no shortness of breath. He has had 2 BMs today-somewhat loose but no overt diarrhea from what I can tell.  No further hematochezia.  No further nausea or vomiting.  Volume status is relatively stable.  Low-grade fever earlier this morning but currently afebrile.  Impression Hypotension of-Unclear etiology-has HFrEF with EF<20%-but is currently immunocompromised-getting concurrent radiation/chemo for tonsillar cancer.  Volume status is stable.  Does not appear acutely sick or septic.  Plan Stop Coreg /Flomax  Gentle IV bolus Add midodrine  TTE Repeat H&H, chemistry panel.   Check random cortisol/lactate/procalcitonin. Will send out UA/chest x-ray/blood cultures. If continues to be hypotensive-will need CCM evaluation.

## 2024-05-07 NOTE — Care Plan (Signed)
 Patient retaining 1215 ml / straight cath was performed

## 2024-05-07 NOTE — Assessment & Plan Note (Deleted)
 Please review oncology history for additional details and timeline of events.   cT3, cN0, cM0, p16 positive disease, stage II. Patient had consultation with Dr. Floretta, otolaryngology at [facility] on 03/19/2024.  Given his cardiac history, he was felt to be a high risk candidate for surgery and hence chemoradiation was recommended. Given his CKD, he is not a candidate for cisplatin.  Hence plan made to proceed with weekly carboplatin  and paclitaxel  during the course of radiation. Monitoring of kidney function and blood counts is essential to adjust chemotherapy dosing and manage potential side effects such as nausea, vomiting, and fatigue. Anti-nausea medications are provided to manage these effects.  If further renal dysfunction is noted, chemotherapy will be adjusted or skipped.  He started concurrent chemoradiation with weekly carboplatin  and paclitaxel  from 04/10/2024. He has experience fatigue.  Labs today reveal creatinine of 1.39, closer to his baseline.  Platelet count was slightly decreased predating the chemotherapy initiation.  Platelet count is 97,000 today.  Plan is to continue chemotherapy as long as platelet count is above 50,000. Given fatigue symptoms, and to avoid further thrombocytopenia, we will dose reduce carboplatin  to AUC 1.5 and proceed with cycle 2 of chemotherapy today with carboplatin  and paclitaxel . - 05/01/2024  - Due to worsening fatigue, weakness, and thrombocytopenia, carboplatin  reduced to AUC 1 on cycle 4.  Present with Taxol  45 mg/m.  New chest x-ray indicated for acute cardiopulmonary disease.   continue radiation daily. Return in 1 week for labs/flush, follow-up, and cycle 5 of carboplatin  and Taxol .

## 2024-05-07 NOTE — Progress Notes (Signed)
 Blood pressure in the 80s systolic still Appears unchanged Slight drop in H&H, lactic acid stable-procalcitonin mildly elevated Rest of labs pending Spoke with wife-she is okay with aggressive care for short period of time, remains a full code. Getting IV albumin  Will get PCCM input-as he is persistently hypotensive and has not really responded to IV fluid boluses.

## 2024-05-07 NOTE — Progress Notes (Signed)
 eLink Physician-Brief Progress Note Patient Name: [name] DOB: November 19, 1945 MRN: [id number]   Date of Service  05/07/2024  HPI/Events of Note  78 year old male with tonsillar cancer status post recent chemotherapy and radiation complicated by heart failure with reduced ejection fraction and severe protein calorie malnutrition, G-tube in place with neutropenia and rectal bleeding.  He was transferred to the ICU in the setting of hypotension and neutropenic fevers  Patient noted to have tachypnea and mild hypotension saturating 94% on room air.  Now started on low-dose norepinephrine .  Results consistent with elevated creatinine, anemia, new cultures.  Radiograph without evidence of new infiltrates.  ANC 1400  eICU Interventions  Continue broad-spectrum antibiotics, new cultures taken  Continue on midodrine , digoxin , and vasopressor support as needed.  Attempt to time a.m. cortisol.  DVT prophylaxis with SCDs, thrombocytopenia GI prophylaxis not indicated     Intervention Category Evaluation Type: New Patient Evaluation  Christian Bowman 05/07/2024, 9:23 PM

## 2024-05-07 NOTE — Consult Note (Signed)
 NAME:  Christian Bowman, MRN:  [id number], DOB:  [birth year], LOS: 0 ADMISSION DATE:  05/06/2024, CONSULTATION DATE:  05/07/24 REFERRING MD:  Raenelle CHIEF COMPLAINT:  Hypotension   History of Present Illness:  Christian Bowman is a 78 y.o. 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  He presented to [facility] ED on 8/5 with rectal bleeding.  He was admitted by the hospitalist team and was evaluated by GI on 8/6.  Was felt that his rectal bleeding was most likely secondary to hemorrhoids for which medical management was recommended.  Over the course of the day, he had ongoing hypotension despite multiple fluid boluses.  Mental status had remained the same and he was essentially asymptomatic.  He had had 2 BMs prior in the day that was somewhat loose but not overt diarrhea and no true hematochezia.  He had had low-grade fever early in the morning but had resolved after 1 dose of Tylenol .  His Coreg  and Flomax  was stopped, he was given additional gentle IV fluids, albumin  as well as started on midodrine .  H&H remained stable (11/32).  Echocardiogram has been ordered and is pending.  Lactic acid 1.9, Pro-Cal 2.79.  Unfortunately later in the afternoon, he remained hypotensive (systolics in the 80s) and PCCM was subsequently called to see in consultation.  Pertinent  Medical History   has a past medical history of AICD (automatic cardioverter/defibrillator) present, Anginal pain (HCC), Anxiety, CAD (coronary artery disease), CHF (congestive heart failure) (HCC), Complication of anesthesia, Coronary artery disease involving coronary bypass graft,  Cyst of neck, DM2 (diabetes mellitus, type 2) (HCC) (08/26/2013), Dyspnea, Heart attack (HCC), HTN (hypertension) (08/26/2013), Hyperlipidemia (08/26/2013), Hypothyroidism, Left main coronary artery disease, Left renal artery stenosis (HCC), Localized cancer of throat (HCC), Obesity (BMI 30.0-34.9) (08/26/2013), Postoperative atrial fibrillation (HCC) (10/15/2005), Presence of permanent cardiac pacemaker, S/P CABG x 4 (10/13/2005), S/P mitral valve replacement with bioprosthetic valve (07/11/2016), and S/P redo CABG x 2 (07/11/2016).   Significant Hospital Events: Including procedures, antibiotic start and stop dates in addition to other pertinent events   8/5 admit. 8/6 GI consult - recommending conservative care for probable hemorrhoidal bleed, Hypotension later prompting PCCM consult.  Interim History / Subjective:  Seen sitting up in bed with flat affect Wife at bedside and updated   Objective    Blood pressure (!) 72/41, pulse 61, temperature 98 F (36.7 C), temperature source Oral, resp. rate 16, height 6' (1.829 m), weight 86 kg, SpO2 96%.       No intake or output data in the 24 hours ending 05/07/24 1840 Filed Weights   05/06/24 1624  Weight: 86 kg    Examination: General: Acute on chronically ill appearing elderly male sitting up in bed, in NAD HEENT: [place]/AT, MM pink/moist, PERRL,  Neuro: Alert and oriented, flat affect  CV: s1s2 regular rate and rhythm, no murmur, rubs, or gallops,  PULM:  Clear to auscultation, no increased work of breathing, no added breath sounds  GI: soft, bowel sounds active in all 4 quadrants, non-tender, non-distended, tolerating TF Extremities: warm/dry, no edema  Skin: no rashes or lesions  Resolved problem list   Assessment  and Plan  Persistent hypotension with leukopenia  -Liley multifactorial including hypovolemia with N/V and or possible infectious given immunosuppression with active chemo but this appears less likely  P: Trend CBC and  fever curve  Start Broad spectrum antibiotics  Continue Albumin  supplementation  Sepsis workup started including culture collection including blood cultures  Start pressors for MAP goal greater than 65 Cautious fluid resuscitation given HFrEF  Place foley for strict intake and output    Chronic HFrEF  -ECHO 06/2023 with EF < 20% with global hypokinesis  CAD s/p CABG 2007 with redo CABG 2017 History of bioprosthetic aortic valve replacement 2017 History of complete heart block s/p Medtronic CRT-D-2017 P: Continuous telemetry  Strict intake and output  Daily weight to assess volume status Closely monitor renal function and electrolytes  Gentle IV hydration   CKD stage IIIa -Baseline GFR ranges mid to upper 40's, renal function remains at baseline  Acute urinary retention  P: Follow renal function  Monitor urine output Trend Bmet Avoid nephrotoxins Ensure adequate renal perfusion  Place Foley    Bright red blood per rectum likely secondary to hemorrhoids  P: GI following  Continue Anusol  suppositories  Trend CBC    Type 2 diabetes  P: Continue SSI  CBG goal 140-180 CBG check q4    Tonsillary cancer on chemo and radiation s/p PEG tube  P: Supportive care  Outpatient follow up    Oral thrush  P: PO Diflucan    Best Practice (right click and Reselect all SmartList Selections daily)   Diet/type: tubefeeds DVT prophylaxis SCD Pressure ulcer(s): N/A GI prophylaxis: PPI Lines:Port a cath yes and it is still needed Foley:  Yes, and it is still needed Code Status:  full code Last date of multidisciplinary goals of care discussion: Pending   Labs   CBC: Recent Labs  Lab 05/01/24 1026 05/06/24 1640 05/07/24 0541 05/07/24 1609  WBC 1.6* 2.1* 1.7*  --   NEUTROABS 1.2* 1.8 1.4*  --   HGB 12.5* 13.0 13.0 11.0*  HCT 36.8* 39.4 38.7* 32.3*  MCV 90.9 93.6 91.9  --   PLT 73* 59* 56*  --     Basic Metabolic Panel: Recent Labs  Lab 05/01/24 1026  05/06/24 1637 05/07/24 0541 05/07/24 1609  NA 136 134* 135 133*  K 4.8 5.4* 4.9 5.0  CL 103 95* 96* 99  CO2 30 28 29 25   GLUCOSE 237* 188* 106* 152*  BUN 39* 58* 48* 47*  CREATININE 1.30* 1.59* 1.48* 1.60*  CALCIUM  8.3* 8.5* 9.0 8.3*  MG  --   --  1.8  --   PHOS  --   --   --  2.5   GFR: Estimated Creatinine Clearance: 43.1 mL/min (A) (by C-G formula based on SCr of 1.6 mg/dL (H)). Recent Labs  Lab 05/01/24 1026 05/06/24 1640 05/07/24 0541 05/07/24 1609  PROCALCITON  --   --   --  2.79  WBC 1.6* 2.1* 1.7*  --   LATICACIDVEN  --   --   --  1.9    Liver Function Tests: Recent Labs  Lab 05/01/24 1026 05/06/24 1637 05/07/24 0541 05/07/24 1609  AST 9* 17 18 14*  ALT 14 29 26 18   ALKPHOS 44 52 47 36*  BILITOT 1.0 1.5* 1.8* 2.3*  PROT 6.0* 5.6* 5.9* 4.6*  ALBUMIN  3.2* 2.9* 2.8* 2.1*   No results for input(s): LIPASE, AMYLASE in the last 168 hours. No results for input(s): AMMONIA in the last 168 hours.  ABG    Component Value Date/Time   PHART 7.373 07/12/2016 1531   PCO2ART 40.1 07/12/2016 1531   PO2ART 67.0 (L) 07/12/2016 1531   HCO3 25.6 01/08/2020 1321   HCO3 25.7 01/08/2020 1321   TCO2 27 01/08/2020 1321   TCO2 27 01/08/2020 1321   ACIDBASEDEF 2.0 07/12/2016 1531   O2SAT 69.0 01/08/2020 1321   O2SAT 67.0 01/08/2020 1321     Coagulation Profile: No results for input(s): INR, PROTIME in the last 168 hours.  Cardiac Enzymes: No results for input(s): CKTOTAL, CKMB, CKMBINDEX, TROPONINI in the last 168 hours.  HbA1C: Hemoglobin A1C  Date/Time Value Ref Range Status  10/01/2018 12:00 AM 6.2  Final  10/01/2017 12:00 AM 6.3  Final   HbA1c, POC (controlled diabetic range)  Date/Time Value Ref Range Status  09/13/2023 02:10 PM 6.7 0.0 - 7.0 % Final  02/07/2023 10:47 AM 7.3 (A) 0.0 - 7.0 % Final   Hgb A1c MFr Bld  Date/Time Value Ref Range Status  02/19/2024 07:11 AM 6.6 (H) 4.8 - 5.6 % Final    Comment:    (NOTE) Pre diabetes:           5.7%-6.4%  Diabetes:              >6.4%  Glycemic control for   <7.0% adults with diabetes     CBG: Recent Labs  Lab 05/06/24 2259 05/07/24 0342 05/07/24 0745 05/07/24 1124 05/07/24 1537  GLUCAP 124* 75 124* 177* 146*    Review of Systems:   Please see the history of present illness. All other systems reviewed and are negative   Past Medical History:  He,  has a past medical history of AICD (automatic cardioverter/defibrillator) present, Anginal pain (HCC), Anxiety, CAD (coronary artery disease), CHF (congestive heart failure) (HCC), Complication of anesthesia, Coronary artery disease involving coronary bypass graft, Cyst of neck, DM2 (diabetes mellitus, type 2) (HCC) (08/26/2013), Dyspnea, Heart attack (HCC), HTN (hypertension) (08/26/2013), Hyperlipidemia (08/26/2013), Hypothyroidism, Left main coronary artery disease, Left renal artery stenosis (HCC), Localized cancer of throat (HCC), Obesity (BMI 30.0-34.9) (08/26/2013), Postoperative atrial fibrillation (HCC) (10/15/2005), Presence of permanent cardiac pacemaker, S/P CABG x 4 (10/13/2005), S/P mitral valve replacement with bioprosthetic valve (07/11/2016), and S/P redo CABG x 2 (07/11/2016).   Surgical History:   Past Surgical History:  Procedure Laterality Date   A-FLUTTER ABLATION N/A 05/11/2017   Procedure: A-Flutter Ablation;  Surgeon: Inocencio Soyla Lunger, MD;  Location: [facility] INVASIVE CV LAB;  Service: Cardiovascular;  Laterality: N/A;   BIV ICD GENERATOR CHANGEOUT N/A 08/29/2023   Procedure: BIV ICD GENERATOR CHANGEOUT;  Surgeon: Inocencio Soyla Lunger, MD;  Location: [facility] INVASIVE CV LAB;  Service: Cardiovascular;  Laterality: N/A;   CARDIAC CATHETERIZATION N/A 06/21/2016   Procedure: Right/Left Heart Cath and Coronary/Graft Angiography;  Surgeon: Ozell Fell, MD;  Location: [facility] INVASIVE CV LAB;  Service: Cardiovascular;  Laterality: N/A;   CARDIAC VALVE REPLACEMENT     CARDIOVERSION N/A 07/19/2016   Procedure:  CARDIOVERSION;  Surgeon: Redell GORMAN Shallow, MD;  Location: [facility] ENDOSCOPY;  Service: Cardiovascular;  Laterality: N/A;   CARDIOVERSION N/A 09/08/2016   Procedure: CARDIOVERSION;  Surgeon: Vina LULLA Gull, MD;  Location: [facility] ENDOSCOPY;  Service: Cardiovascular;  Laterality: N/A;   CORONARY ANGIOPLASTY     STENT 2016  [place] [facility]   CORONARY ANGIOPLASTY WITH STENT PLACEMENT     DES in SVG to right coronary artery system   CORONARY ARTERY BYPASS GRAFT  10/13/2005   LIMA to LAD, SVG to intermediate  branch, sequential SVG to PDA and RPL   CORONARY ARTERY BYPASS GRAFT N/A 07/11/2016   Procedure: REDO CORONARY ARTERY BYPASS GRAFTING (CABG) x two using left leg greater saphenous vein harvested endoscopically-SVG to PDA -SVG to RAMUS INTERMEDIATE;  Surgeon: Sudie VEAR Laine, MD;  Location: [facility] OR;  Service: Open Heart Surgery;  Laterality: N/A;   CORONARY ARTERY BYPASS GRAFT N/A 07/11/2016   Procedure: Re-exploration (CABG) for post op bleeding,;  Surgeon: Sudie VEAR Laine, MD;  Location: [facility] OR;  Service: Open Heart Surgery;  Laterality: N/A;   EP IMPLANTABLE DEVICE N/A 07/25/2016   Procedure: BiV ICD Insertion CRT-D;  Surgeon: Will Gladis Norton, MD;  Location: [facility] INVASIVE CV LAB;  Service: Cardiovascular;  Laterality: N/A;   INGUINAL HERNIA REPAIR Left 08/20/2017   INGUINAL HERNIA REPAIR Left 08/20/2017   Procedure: OPEN LEFT INGUINAL HERNIA REPAIR;  Surgeon: Ethyl Lenis, MD;  Location: [facility] OR;  Service: General;  Laterality: Left;   IR GASTROSTOMY TUBE MOD SED  04/09/2024   IR IMAGING GUIDED PORT INSERTION  04/09/2024   LEFT HEART CATH AND CORS/GRAFTS ANGIOGRAPHY N/A 12/18/2016   Procedure: Left Heart Cath and Cors/Grafts Angiography;  Surgeon: Ozell Fell, MD;  Location: [facility] INVASIVE CV LAB;  Service: Cardiovascular;  Laterality: N/A;   LEFT HEART CATH AND CORS/GRAFTS ANGIOGRAPHY N/A 08/22/2017   Procedure: LEFT HEART CATH AND CORS/GRAFTS ANGIOGRAPHY;  Surgeon: Rolan Ezra RAMAN, MD;  Location: [facility]  INVASIVE CV LAB;  Service: Cardiovascular;  Laterality: N/A;   MITRAL VALVE REPLACEMENT N/A 07/11/2016   Procedure: MITRAL VALVE (MV) REPLACEMENT;  Surgeon: Sudie VEAR Laine, MD;  Location: [facility] OR;  Service: Open Heart Surgery;  Laterality: N/A;   MYOCARDICAL PERFUSION  10/09/2007   NORMAL PERFUSION IN ALL REGIONS;NO EVIDENCE OF INDUCIBLE ISCHEMIA;POST STRESS EF% 66   RENAL ARTERY STENT Right 2007   RENAL DOPPLER  03/28/2010   RIGHT RA-NORMAL;LEFT PROXIMAL RA AT STENT-PATENT WITH NO EVIDENCE OF SIGN DIAMETER REDUCTION. R & L KIDNEYS: EQUAL IN SIZE,SYMMETRICAL IN SHAPE.   RIGHT HEART CATH N/A 01/08/2020   Procedure: RIGHT HEART CATH;  Surgeon: Rolan Ezra RAMAN, MD;  Location: [facility] INVASIVE CV LAB;  Service: Cardiovascular;  Laterality: N/A;   TEE WITHOUT CARDIOVERSION N/A 06/15/2016   Procedure: TRANSESOPHAGEAL ECHOCARDIOGRAM (TEE);  Surgeon: Jerel Balding, MD;  Location: [facility] ENDOSCOPY;  Service: Cardiovascular;  Laterality: N/A;   TEE WITHOUT CARDIOVERSION N/A 07/11/2016   Procedure: TRANSESOPHAGEAL ECHOCARDIOGRAM (TEE);  Surgeon: Sudie VEAR Laine, MD;  Location: [facility] OR;  Service: Open Heart Surgery;  Laterality: N/A;   TEE WITHOUT CARDIOVERSION N/A 07/19/2016   Procedure: TRANSESOPHAGEAL ECHOCARDIOGRAM (TEE);  Surgeon: Redell RAMAN Shallow, MD;  Location: [facility] ENDOSCOPY;  Service: Cardiovascular;  Laterality: N/A;   TRANSESOPHAGEAL ECHOCARDIOGRAM  10/19/2005   NORMAL LV; MILD TO MODERATE AMOUNT OF SOFT ATHEROMATOUS PLAQUE OF THE THORACIC AORTA; THE LEFT ATRIUM IS MILDLY DILATED;LEFT ATRIAL APPENDAGE FUNCTION IS NORMAL;NO THROMBUS IDENTIFIED. SMALL PFO WITH RIGHT TO LEFT SHUNT     Social History:   reports that he has never smoked. He has never used smokeless tobacco. He reports that he does not drink alcohol and does not use drugs.   Family History:  His family history includes Heart attack in his father and mother; Heart disease in his father, maternal grandmother, and mother; Hypertension in his father and  sister.   Allergies Allergies  Allergen Reactions   Xanax [Alprazolam] Other (See Comments)    Pt feels very weak, tired and feels paralyzed  Home Medications  Prior to Admission medications   Medication Sig Start Date End Date Taking? Authorizing Provider  acetaminophen  (TYLENOL ) 325 MG tablet Take 325 mg by mouth every 6 (six) hours as needed for mild pain (pain score 1-3).   Yes [provider]  apixaban  (ELIQUIS ) 5 MG TABS tablet Take 1 tablet (5 mg total) by mouth 2 (two) times daily. 01/23/24  Yes Croitoru, Mihai, MD  carvedilol  (COREG ) 12.5 MG tablet TAKE 1 & 1/2 (ONE & ONE-HALF) TABLETS BY MOUTH TWICE DAILY WITH A MEAL 02/20/24  Yes Rolan Ezra RAMAN, MD  Cholecalciferol  (VITAMIN D ) 50 MCG (2000 UT) tablet Take 2,000 Units by mouth daily with breakfast.   Yes [provider]  dapagliflozin  propanediol (FARXIGA ) 10 MG TABS tablet Take 1 tablet (10 mg total) by mouth daily. 12/26/23  Yes Croitoru, Mihai, MD  dexamethasone  (DECADRON ) 4 MG tablet Take 2 tablets daily for 2 days, start the day after chemotherapy. Take with food. Patient taking differently: Take 8 mg by mouth daily as needed (Take only after chemotherapy per wife. Take with food.). 03/21/24  Yes Pasam, Avinash, MD  digoxin  (LANOXIN ) 0.125 MG tablet Take 1/2 (one-half) tablet by mouth once daily 12/26/23  Yes Croitoru, Mihai, MD  eplerenone  (INSPRA ) 50 MG tablet Take 1 tablet (50 mg total) by mouth daily. 06/11/23  Yes Rolan Ezra RAMAN, MD  fluconazole  (DIFLUCAN ) 10 MG/ML suspension Take 20mL per tube today, then 10mL per tube daily for 20 more days. HOLD ROSUVASTATIN  WHILE ON THIS. Patient taking differently: Place 20 mLs into feeding tube daily. Take 20mL per tube today, then 10mL per tube daily for 20 more days. HOLD ROSUVASTATIN  WHILE ON THIS. 04/28/24  Yes Izell Domino, MD  furosemide  (LASIX ) 20 MG tablet Take 1 tablet (20 mg total) by mouth daily as needed (for a weight over 190 lbs). 12/26/23  Yes  Croitoru, Mihai, MD  glipiZIDE  (GLUCOTROL ) 5 MG tablet Take 1 tablet (5 mg total) by mouth daily before breakfast. Only one time daily at breakfast. 04/30/24  Yes Nida, Ethelle ORN, MD  guaiFENesin  300 MG/15ML LIQD Place 600 mg into feeding tube 4 (four) times daily as needed. Patient taking differently: Place 600 mg into feeding tube 4 (four) times daily as needed (for congestion). 05/01/24  Yes Boscia, Heather E, NP  levothyroxine  (SYNTHROID ) 50 MCG tablet Take 1 tablet (50 mcg total) by mouth daily before breakfast. 03/19/24  Yes Nida, Gebreselassie W, MD  lidocaine  (XYLOCAINE ) 2 % solution Patient: Mix 1part 2% viscous lidocaine , 1part H20. Swish & swallow 10mL of diluted mixture, 30min before meals and at bedtime, up to QID 04/15/24  Yes Izell Domino, MD  lidocaine -prilocaine  (EMLA ) cream Apply to affected area once 03/21/24  Yes Pasam, Avinash, MD  neomycin-bacitracin-polymyxin (NEOSPORIN) ointment Apply 1 application  topically daily as needed for wound care.   Yes [provider]  nitroGLYCERIN  (NITROSTAT ) 0.4 MG SL tablet DISSOLVE 1 TABLET UNDER THE TONGUE EVERY 5 MINUTES AS NEEDED FOR CHEST PAIN, DO NOT EXCEED 3 DOSES IN 15 MINUTES 12/26/23  Yes Croitoru, Mihai, MD  Nutritional Supplements (FEEDING SUPPLEMENT, OSMOLITE 1.5 CAL,) LIQD Give 2 cartons at 8am, noon, 4pm bolus syringe and 1 carton at 8pm.  Flush with 60ml of water before and after each feeding.  Give additional 240ml TID between feedings for additional hydration. 04/28/24  Yes Izell Domino, MD  ondansetron  (ZOFRAN ) 8 MG tablet Take 1 tablet (8 mg total) by mouth every 8 (eight) hours as needed for nausea or  vomiting. Start on the third day after chemotherapy. 03/21/24  Yes Pasam, Chinita, MD  oxyCODONE  (ROXICODONE ) 5 MG/5ML solution Place 5 mLs (5 mg total) into feeding tube every 6 (six) hours as needed for severe pain (pain score 7-10). 05/01/24  Yes Boscia, Heather E, NP  polyethylene glycol (MIRALAX ) 17 g packet Take 17 g  by mouth daily as needed for mild constipation or moderate constipation.   Yes [provider]  prochlorperazine  (COMPAZINE ) 10 MG tablet Take 1 tablet (10 mg total) by mouth every 6 (six) hours as needed for nausea or vomiting. 03/21/24  Yes Pasam, Avinash, MD  rosuvastatin  (CRESTOR ) 10 MG tablet Take 1 tablet (10 mg total) by mouth daily. 06/11/23  Yes Rolan Ezra RAMAN, MD  sacubitril -valsartan  (ENTRESTO ) 97-103 MG Take 1 tablet by mouth twice daily 04/07/24  Yes McLean, Dalton S, MD  Tetrahydrozoline HCl (VISINE OP) Place 1 drop into both eyes daily as needed (irritation).   Yes [provider]  Blood Glucose Monitoring Suppl (ACCU-CHEK GUIDE ME) w/Device KIT 1 Piece by Does not apply route as directed. 02/01/22   Nida, Gebreselassie W, MD  glucose blood (ACCU-CHEK GUIDE TEST) test strip Use to test blood glucose daily as instructed 03/17/24   Nida, Gebreselassie W, MD  sodium zirconium cyclosilicate  (LOKELMA ) 10 g PACK packet DISSOLVE 1 PACKET IN WATER & DRINK ONCE DAILY 09/24/23   Croitoru, Jerel, MD     Critical care time:   CRITICAL CARE Performed by: Leontae Bostock D. Harris   Total critical care time: 42 minutes  Critical care time was exclusive of separately billable procedures and treating other patients.  Critical care was necessary to treat or prevent imminent or life-threatening deterioration.  Critical care was time spent personally by me on the following activities: development of treatment plan with patient and/or surrogate as well as nursing, discussions with consultants, evaluation of patient's response to treatment, examination of patient, obtaining history from patient or surrogate, ordering and performing treatments and interventions, ordering and review of laboratory studies, ordering and review of radiographic studies, pulse oximetry and re-evaluation of patient's condition.  Ashelyn Mccravy D. Harris, NP-C [facility] Personal contact information can  be found on Amion  If no contact or response made please call [phone] 05/07/2024, 6:43 PM

## 2024-05-07 NOTE — Progress Notes (Signed)
 Pharmacy Antibiotic Note  Christian Bowman is a 78 y.o. male admitted on 05/06/2024 with sepsis.  Pharmacy has been consulted for zosyn  dosing.  Pt is being tx for ICU tonight for hypotension and being r/o sepsis. Vanc and zosyn  ordered by d/w Benton Lesches and we will use zyvox  instead due to his CKD issue.   Wbc 1.7, ANC 1400, scr 1.6  Plan: Zosyn  3.375g IV q 8 Zyvox  600mg  IV q12 F/u cultures  Height: 6' (182.9 cm) Weight: 86 kg (189 lb 9.5 oz) IBW/kg (Calculated) : 77.6  Temp (24hrs), Avg:98.8 F (37.1 C), Min:98 F (36.7 C), Max:100.3 F (37.9 C)  Recent Labs  Lab 05/01/24 1026 05/06/24 1637 05/06/24 1640 05/07/24 0541 05/07/24 1609  WBC 1.6*  --  2.1* 1.7*  --   CREATININE 1.30* 1.59*  --  1.48* 1.60*  LATICACIDVEN  --   --   --   --  1.9    Estimated Creatinine Clearance: 43.1 mL/min (A) (by C-G formula based on SCr of 1.6 mg/dL (H)).    Allergies  Allergen Reactions   Xanax [Alprazolam] Other (See Comments)    Pt feels very weak, tired and feels paralyzed      Antimicrobials this admission: 8/6 fluconazole >>8/16 8/6 linezolid >> 8/6 zosyn >>  Dose adjustments this admission:   Microbiology results: 8/6 MRSA>> 8/6 GI panel>> 8/6 blood>> 8/6 c.diff>>   Sergio Batch, PharmD, [place], AAHIVP, CPP Infectious Disease Pharmacist 05/07/2024 7:12 PM

## 2024-05-07 NOTE — Progress Notes (Addendum)
 Brief Nutrition Note  Consult received for enteral/tube feeding initiation and management. MD would like to start at low rate to see if he tolerates.   Adult Enteral Nutrition Protocol initiated. Full assessment to follow.  G-tube tube in place and taking in Osmolite 1.5 at baseline, per cancer center RD note. Will start at continuous trickle rate. Has been unable to achieve his full ordered regimen at home (Osmolite 1.5 - 7 cartons per day over four feedings). Some risk for refeeding r/t under nutrition and reports of N/V.   Admitting Dx: Rectal bleeding [K62.5] BRBPR (bright red blood per rectum) [K62.5]  Body mass index is 25.71 kg/m. Pt meets criteria for within normative standards for age based on current BMI.  Labs:  Recent Labs  Lab 05/01/24 1026 05/06/24 1637 05/07/24 0541  NA 136 134* 135  K 4.8 5.4* 4.9  CL 103 95* 96*  CO2 30 28 29   BUN 39* 58* 48*  CREATININE 1.30* 1.59* 1.48*  CALCIUM  8.3* 8.5* 9.0  MG  --   --  1.8  GLUCOSE 237* 188* 106*    Blair Deaner MS, RD, LDN Registered Dietitian Clinical Nutrition RD Inpatient Contact Info in Amion

## 2024-05-07 NOTE — Progress Notes (Signed)
 Admitted to the hospital 8/5.

## 2024-05-07 NOTE — Progress Notes (Addendum)
 PROGRESS NOTE        PATIENT DETAILS Name: Christian Bowman Age: 78 y.o. Sex: male Date of Birth: 1945-10-26 Admit Date: 05/06/2024 Admitting Physician Jorie JONELLE Blanch, MD PCP:Pcp, No  Brief Summary: Patient is a 78 y.o.  male with history of tonsillar cancer-getting concurrent chemo/radiation, chronic HFrEF, complete heart block-s/p CRT-D, A-fib on Eliquis , CAD s/p CABG, s/p bioprosthetic MVR, CVA-presented to the ED with nausea, vomiting, diarrhea and intermittent rectal bleeding.  Significant events: 8/5>> admit to [facility]  Significant studies: 8/5>> x-ray abdomen: Nonobstructive bowel gas pattern  Significant microbiology data: None  Procedures: None  Consults: GI  Subjective: Lying comfortably in bed-denies any chest pain or shortness of breath.  No further nausea/vomiting/diarrhea or bloody stools overnight.  Had acute urinary retention earlier this morning-bladder scan around 900-ambulated to bedside commode and urinated a bit.  Objective: Vitals: Blood pressure (!) 122/50, pulse 74, temperature 99.2 F (37.3 C), temperature source Oral, resp. rate (!) 22, height 6' (1.829 m), weight 86 kg, SpO2 96%.   Exam: Gen Exam:Alert awake-not in any distress HEENT:atraumatic, normocephalic Chest: B/L clear to auscultation anteriorly CVS:S1S2 regular Abdomen:soft non tender, non distended Extremities:no edema Neurology: Non focal Skin: no rash  Pertinent Labs/Radiology:    Latest Ref Rng & Units 05/07/2024    5:41 AM 05/06/2024    4:40 PM 05/01/2024   10:26 AM  CBC  WBC 4.0 - 10.5 K/uL 1.7  2.1  1.6   Hemoglobin 13.0 - 17.0 g/dL 86.9  86.9  87.4   Hematocrit 39.0 - 52.0 % 38.7  39.4  36.8   Platelets 150 - 400 K/uL 56  59  73     Lab Results  Component Value Date   NA 135 05/07/2024   K 4.9 05/07/2024   CL 96 (L) 05/07/2024   CO2 29 05/07/2024      Assessment/Plan: Bright red blood per rectum-probably secondary to hemorrhoids Continue Anusol   suppository. Appreciate GI input Hb stable.  Nausea/vomiting/diarrhea Potentially secondary to chemotherapy Seems to have improved-but if worsens-will send out stool studies Resume tube feeds at a low rate Antiemetics Follow closely.  Recent history of esophageal candidiasis Continue fluconazole   Leukopenia/thrombocytopenia Likely secondary to chemotherapy Watch closely for now Repeat CBC with differential in AM  CKD stage IIIa Close to baseline Follow electrolytes  Acute urinary retention Occurred on 8/6 AM-bladder scan around 900 After discussion with patient/RN-he urinated a bit in the bedside commode Start Flomax  Frequent bladder scans-if continues to retain-will either need in/out cath or a Foley catheterization.  Tonsillar cancer- getting radiation/chemo-s/p PEG tube in place Both chemo/radiation currently on hold-patient does not want to proceed with radiation therapy today-I have let the radiation oncology team know. Supportive care-see above  History of A-fib/a flutter Paced rhythm Telemetry monitoring Given worsening thrombocytopenia-intermittent rectal bleeding-continue to hold Eliquis  Digoxin  level stable-should be okay to resume digoxin  Continue Coreg .  Chronic HFrEF (EF<20% by TTE 06/07/2023) Euvolemic-in fact on the dry side Continue Coreg  BP soft-hold Entresto /Inspra  this a.m. Dose diuretics as needed  CAD s/p CABG in 2007 and redo CABG in 2017 No anginal symptoms.  History of bioprosthetic aortic valve replacement 2017  History of complete heart block s/p [employer] CRT-D-2017 Paced rhythm Telemetry monitoring  History of CVA Eliquis  on hold Resume statin  DM-2 (A1c 6.6 on 5/20) CBG stable with SSI Oral hypoglycemic agents  on hold  Recent Labs    05/06/24 2259 05/07/24 0342 05/07/24 0745  GLUCAP 124* 75 124*    Hypothyroidism Synthroid   Debility/deconditioning PT/OT eval.  Code status:   Code Status: Full Code   DVT  Prophylaxis: SCDs Start: 05/06/24 1953   Family Communication: Spouse at bedside   Disposition Plan: Status is: Observation The patient will require care spanning > 2 midnights and should be moved to inpatient because: Severity of illness   Planned Discharge Destination:Home health   Diet: Diet Order             Diet NPO time specified  Diet effective now                     Antimicrobial agents: Anti-infectives (From admission, onward)    Start     Dose/Rate Route Frequency Ordered Stop   05/07/24 1000  fluconazole  (DIFLUCAN ) 10 MG/ML suspension 100 mg  Status:  Discontinued       Note to Pharmacy: Take 20mL per tube today, then 10mL per tube daily for 20 more days. HOLD ROSUVASTATIN  WHILE ON THIS.     100 mg Per Tube Daily 05/06/24 2315 05/07/24 0005   05/07/24 1000  fluconazole  (DIFLUCAN ) tablet 100 mg        100 mg Per Tube Daily 05/07/24 0006 05/18/24 0959        MEDICATIONS: Scheduled Meds:  carvedilol   18.75 mg Per Tube BID WC   Chlorhexidine  Gluconate Cloth  6 each Topical Daily   fluconazole   100 mg Per Tube Daily   hydrocortisone   25 mg Rectal BID   insulin  aspart  0-9 Units Subcutaneous Q4H   levothyroxine   50 mcg Per Tube QAC breakfast   loperamide   2 mg Oral Daily   sodium chloride  flush  10-40 mL Intracatheter Q12H   sodium chloride  flush  3 mL Intravenous Q12H   Continuous Infusions: PRN Meds:.acetaminophen  **OR** acetaminophen , ondansetron  (ZOFRAN ) IV, oxyCODONE , senna-docusate, sodium chloride  flush   I have personally reviewed following labs and imaging studies  LABORATORY DATA: CBC: Recent Labs  Lab 05/01/24 1026 05/06/24 1640 05/07/24 0541  WBC 1.6* 2.1* 1.7*  NEUTROABS 1.2* 1.8 1.4*  HGB 12.5* 13.0 13.0  HCT 36.8* 39.4 38.7*  MCV 90.9 93.6 91.9  PLT 73* 59* 56*    Basic Metabolic Panel: Recent Labs  Lab 05/01/24 1026 05/06/24 1637 05/07/24 0541  NA 136 134* 135  K 4.8 5.4* 4.9  CL 103 95* 96*  CO2 30 28 29    GLUCOSE 237* 188* 106*  BUN 39* 58* 48*  CREATININE 1.30* 1.59* 1.48*  CALCIUM  8.3* 8.5* 9.0  MG  --   --  1.8    GFR: Estimated Creatinine Clearance: 46.6 mL/min (A) (by C-G formula based on SCr of 1.48 mg/dL (H)).  Liver Function Tests: Recent Labs  Lab 05/01/24 1026 05/06/24 1637 05/07/24 0541  AST 9* 17 18  ALT 14 29 26   ALKPHOS 44 52 47  BILITOT 1.0 1.5* 1.8*  PROT 6.0* 5.6* 5.9*  ALBUMIN  3.2* 2.9* 2.8*   No results for input(s): LIPASE, AMYLASE in the last 168 hours. No results for input(s): AMMONIA in the last 168 hours.  Coagulation Profile: No results for input(s): INR, PROTIME in the last 168 hours.  Cardiac Enzymes: No results for input(s): CKTOTAL, CKMB, CKMBINDEX, TROPONINI in the last 168 hours.  BNP (last 3 results) No results for input(s): PROBNP in the last 8760 hours.  Lipid Profile: No results for  input(s): CHOL, HDL, LDLCALC, TRIG, CHOLHDL, LDLDIRECT in the last 72 hours.  Thyroid  Function Tests: No results for input(s): TSH, T4TOTAL, FREET4, T3FREE, THYROIDAB in the last 72 hours.  Anemia Panel: No results for input(s): VITAMINB12, FOLATE, FERRITIN, TIBC, IRON, RETICCTPCT in the last 72 hours.  Urine analysis:    Component Value Date/Time   COLORURINE AMBER (A) 07/23/2016 1738   APPEARANCEUR CLEAR 07/23/2016 1738   LABSPEC 1.010 07/23/2016 1738   PHURINE 6.5 07/23/2016 1738   GLUCOSEU NEGATIVE 07/23/2016 1738   HGBUR NEGATIVE 07/23/2016 1738   BILIRUBINUR NEGATIVE 07/23/2016 1738   KETONESUR NEGATIVE 07/23/2016 1738   PROTEINUR NEGATIVE 07/23/2016 1738   NITRITE NEGATIVE 07/23/2016 1738   LEUKOCYTESUR NEGATIVE 07/23/2016 1738    Sepsis Labs: Lactic Acid, Venous No results found for: LATICACIDVEN  MICROBIOLOGY: No results found for this or any previous visit (from the past 240 hours).  RADIOLOGY STUDIES/RESULTS: Abd 1 View (KUB) Result Date: 05/06/2024 CLINICAL DATA:   Nausea vomiting and diarrhea. EXAM: ABDOMEN - 1 VIEW COMPARISON:  None Available. FINDINGS: Percutaneous gastrostomy with balloon over the epigastric area. No bowel dilatation or evidence of obstruction. No free air. Osteopenia with degenerative changes of the spine. No acute osseous pathology. IMPRESSION: Nonobstructive bowel gas pattern. Electronically Signed   By: Vanetta Chou M.D.   On: 05/06/2024 20:42     LOS: 0 days   Donalda Applebaum, MD  [facility]    To contact the attending provider between 7A-7P or the covering provider during after hours 7P-7A, please log into the web site [email or web address] and access using universal [place] password for that web site. If you do not have the password, please call the hospital operator.  05/07/2024, 11:00 AM

## 2024-05-07 NOTE — Plan of Care (Signed)

## 2024-05-07 NOTE — Consult Note (Addendum)
 ATTESTATION & SIGNATURE   STAFF NOTE: I, Dr CHRISTELLA Cave have personally reviewed patient's available data, including medical history, events of note, physical examination and test results as part of my evaluation. I have discussed with resident/NP and other care providers such as pharmacist, RN and RRT.  In addition,  I personally evaluated patient and elicited key findings of   S: Date of admit 05/06/2024 with LOS 0 for today 05/07/2024 : Christian Bowman is  severe systolic CHF ef < 20% when last checked a year ago. Has baseline severe protein calorie malnutrition, G tube.  Has tonsillar cancer and is s/p recent chemo and XRT and is neutropenic.  He also has had poor po intake and rectal bleeding  Now with hypotension, fever, low WBC and high procal  Baseline SBP per wife is > 100. Currently 24s  Wife is in tears - because he is afraid he will die  Cortisol 50s  Rectal bleeding deemed hemorrhoids  O:  Blood pressure (!) 72/41, pulse 61, temperature 98 F (36.7 C), temperature source Oral, resp. rate 16, height 6' (1.829 m), weight 86 kg, SpO2 96%.   Cachexia Supine [place] foley cath CTA  AXOx3 Rt neck mass Elevated JVP   LABS    PULMONARY No results for input(s): PHART, PCO2ART, PO2ART, HCO3, TCO2, O2SAT in the last 168 hours.  Invalid input(s): PCO2, PO2  CBC Recent Labs  Lab 05/01/24 1026 05/06/24 1640 05/07/24 0541 05/07/24 1609  HGB 12.5* 13.0 13.0 11.0*  HCT 36.8* 39.4 38.7* 32.3*  WBC 1.6* 2.1* 1.7*  --   PLT 73* 59* 56*  --     COAGULATION No results for input(s): INR in the last 168 hours.  CARDIAC  No results for input(s): TROPONINI in the last 168 hours. No results for input(s): PROBNP in the last 168 hours.   CHEMISTRY Recent Labs  Lab 05/01/24 1026 05/06/24 1637 05/07/24 0541 05/07/24 1609  NA 136 134* 135 133*  K 4.8 5.4* 4.9 5.0  CL 103 95* 96* 99  CO2 30 28 29 25   GLUCOSE 237* 188* 106* 152*  BUN 39* 58* 48* 47*   CREATININE 1.30* 1.59* 1.48* 1.60*  CALCIUM  8.3* 8.5* 9.0 8.3*  MG  --   --  1.8  --   PHOS  --   --   --  2.5   Estimated Creatinine Clearance: 43.1 mL/min (A) (by C-G formula based on SCr of 1.6 mg/dL (H)).   LIVER Recent Labs  Lab 05/01/24 1026 05/06/24 1637 05/07/24 0541 05/07/24 1609  AST 9* 17 18 14*  ALT 14 29 26 18   ALKPHOS 44 52 47 36*  BILITOT 1.0 1.5* 1.8* 2.3*  PROT 6.0* 5.6* 5.9* 4.6*  ALBUMIN  3.2* 2.9* 2.8* 2.1*     INFECTIOUS Recent Labs  Lab 05/07/24 1609  LATICACIDVEN 1.9  PROCALCITON 2.79     ENDOCRINE CBG (last 3)  Recent Labs    05/07/24 0745 05/07/24 1124 05/07/24 1537  GLUCAP 124* 177* 146*         IMAGING x24h  - image(s) personally visualized  -   highlighted in bold DG Chest Port 1V same Day Result Date: 05/07/2024 CLINICAL DATA:  Hypotension. EXAM: PORTABLE CHEST 1 VIEW COMPARISON:  May 01, 2024. FINDINGS: Stable cardiomediastinal silhouette. Status post coronary artery bypass graft as well as mitral valve repair. Left-sided defibrillator is unchanged. Right sided Port-A-Cath is unchanged. Lungs are clear. Bony thorax is unremarkable. IMPRESSION: No active disease. Electronically Signed  By: Lynwood Landy Raddle M.D.   On: 05/07/2024 17:19   Abd 1 View (KUB) Result Date: 05/06/2024 CLINICAL DATA:  Nausea vomiting and diarrhea. EXAM: ABDOMEN - 1 VIEW COMPARISON:  None Available. FINDINGS: Percutaneous gastrostomy with balloon over the epigastric area. No bowel dilatation or evidence of obstruction. No free air. Osteopenia with degenerative changes of the spine. No acute osseous pathology. IMPRESSION: Nonobstructive bowel gas pattern. Electronically Signed   By: Vanetta Chou M.D.   On: 05/06/2024 20:42      A:  Circulatory shock - likely dehydration with sepsis (rectal bleeding deemed due to hemorrhoids)  P:  Gentle fluids Levophed  via PIV Goals SBP > 100  Blood culture UA -> urine culture Vanc  Zosyn   Monitor for  bleeding\ Full code but short term full medical care per wife   Anti-infectives (From admission, onward)    Start     Dose/Rate Route Frequency Ordered Stop   05/07/24 1000  fluconazole  (DIFLUCAN ) 10 MG/ML suspension 100 mg  Status:  Discontinued       Note to Pharmacy: Take 20mL per tube today, then 10mL per tube daily for 20 more days. HOLD ROSUVASTATIN  WHILE ON THIS.     100 mg Per Tube Daily 05/06/24 2315 05/07/24 0005   05/07/24 1000  fluconazole  (DIFLUCAN ) tablet 100 mg        100 mg Per Tube Daily 05/07/24 0006 05/18/24 0959        Rest per NP/medical resident whose note is outlined above and that I agree with  The patient is critically ill with multiple organ systems failure and requires high complexity decision making for assessment and support, frequent evaluation and titration of therapies, application of advanced monitoring technologies and extensive interpretation of multiple databases.   Critical Care Time devoted to patient care services described in this note is  20  Minutes. This time reflects time of care of this signee Dr Dorethia Cave. This critical care time does not reflect procedure time, or teaching time or supervisory time of PA/NP/Med student/Med Resident etc but could involve care discussion time     Dr. Dorethia Cave, M.D., [facility] [place].C.P Pulmonary and Critical Care Medicine Staff Physician  System [facility] Pager: [phone], If no answer or between  15:00h - 7:00h: call [phone]  05/07/2024 6:40 PM

## 2024-05-07 NOTE — Consult Note (Addendum)
 Consultation Note   Referring Provider:  [facility] PCP: Pcp, No Primary Gastroenterologist: Unassigned       Reason for Consultation: Lower GI bleed on Eliquis  DOA: 05/06/2024         Hospital Day: 2   Patient Profile:  78 year old male with multiple medical problems not limited to an extensive cardiac history of severe cardiomyopathy with low EF ,  A-fib/ A flutter on Eliquis , , CAD status post redo CABG, status post bioprosthetic MVR, CVA, CKD 3 AA, DM2, tonsillar cancer on chemoradiation status post G-tube   ASSESSMENT    Tonsillar cancer Undergoing chemotherapy and radiation. Has G-tube for enteral feedings.   Lower GI bleeding, suspect hemorrhoidal bleeding.  His hemoglobin is stable at 13  Neutropenia /thrombocytopenia Probably secondary to chemotherapy.   Oral candidiasis Radiation oncologist started Diflucan  a few days ago.  No obvious Candida on exam today.   Mild elevation in total bilirubin Tbili 1.8. Alk phos and transaminases normal.   A-fib/A flutter Takes Eliquis  at home  Urinary retention Being addressed by primary team  Severe cardiomyopathy  CHF with a EF of 20 to 25% Bioprosthetic MVR BNP 1837 Chest x-ray negative for effusions  Principal Problem:   BRBPR (bright red blood per rectum) Active Problems:   Chronic heart failure with reduced ejection fraction (HFrEF, <= 40%) (HCC)   Coronary artery disease involving coronary bypass graft   Hypothyroidism (acquired)   Type 2 diabetes mellitus with stage 3a chronic kidney disease, without long-term current use of insulin  (HCC)   Primary cancer of tonsillar fossa (HCC)   Chronic kidney disease, stage 3a (HCC)   Thrombocytopenia (HCC)   Hyperkalemia   Leukopenia due to antineoplastic chemotherapy (HCC)   History of CVA (cerebrovascular accident)      PLAN:   --Anusol  cream per rectum twice daily.  --Will give low-dose Imodium  once daily  to try and slow down the loose stool.  Would like to give the minimal amount given his history of chronic constipation --Monitor LFTs, if remain elevated will get RUQ ultrasound and possibly additional liver labs.  -- Ideally patient could undergo a colonoscopy, especially since he has never had one.  However he has severe cardiomyopathy, LVEF 20 to 25% so is at increased risk for procedures.   HPI    Christian Bowman was admitted yesterday with rectal bleeding, nausea,   vomiting, diarrhea thought to possibly related to chemotherapy. Wife helps with history. Patient has a longstanding history of rectal bleeding which he attributes to hemorrhoids. The amount of blood with BMs has increased lately on Eliquis . He has no rectal pain.  No abdominal pain.  He has never used any topical steroids nor anything else for hemorrhoid treatment.  He has chronic constipation but since starting chemotherapy and tube feeds his stools have been very loose.  Patient has never had a colonoscopy.  He has no family history of colon cancer.    Relevant workup thus far: KUB-no acute findings WBC of 1.7, platelets 56 BUN 48, creatinine 1.48 Total bilirubin 1.8, albumin  2.8, remainder of LFTs normal BNP 1837  Labs and Imaging:  Recent Labs    05/06/24 1637 05/07/24 0541  PROT 5.6* 5.9*  ALBUMIN  2.9* 2.8*  AST  17 18  ALT 29 26  ALKPHOS 52 47  BILITOT 1.5* 1.8*   Recent Labs    05/06/24 1640 05/07/24 0541  WBC 2.1* 1.7*  HGB 13.0 13.0  HCT 39.4 38.7*  MCV 93.6 91.9  PLT 59* 56*   Recent Labs    05/06/24 1637 05/07/24 0541  NA 134* 135  K 5.4* 4.9  CL 95* 96*  CO2 28 29  GLUCOSE 188* 106*  BUN 58* 48*  CREATININE 1.59* 1.48*  CALCIUM  8.5* 9.0     Abd 1 View (KUB) CLINICAL DATA:  Nausea vomiting and diarrhea.  EXAM: ABDOMEN - 1 VIEW  COMPARISON:  None Available.  FINDINGS: Percutaneous gastrostomy with balloon over the epigastric area. No bowel dilatation or evidence of obstruction. No  free air. Osteopenia with degenerative changes of the spine. No acute osseous pathology.  IMPRESSION: Nonobstructive bowel gas pattern.  Electronically Signed   By: Vanetta Chou M.D.   On: 05/06/2024 20:42   Rectal bleeding chronically she has got worse on Eliquis  being treated risk   Past Surgical History:  Procedure Laterality Date   A-FLUTTER ABLATION N/A 05/11/2017   Procedure: A-Flutter Ablation;  Surgeon: Inocencio Soyla Lunger, MD;  Location: [facility] INVASIVE CV LAB;  Service: Cardiovascular;  Laterality: N/A;   BIV ICD GENERATOR CHANGEOUT N/A 08/29/2023   Procedure: BIV ICD GENERATOR CHANGEOUT;  Surgeon: Inocencio Soyla Lunger, MD;  Location: [facility] INVASIVE CV LAB;  Service: Cardiovascular;  Laterality: N/A;   CARDIAC CATHETERIZATION N/A 06/21/2016   Procedure: Right/Left Heart Cath and Coronary/Graft Angiography;  Surgeon: Ozell Fell, MD;  Location: [facility] INVASIVE CV LAB;  Service: Cardiovascular;  Laterality: N/A;   CARDIAC VALVE REPLACEMENT     CARDIOVERSION N/A 07/19/2016   Procedure: CARDIOVERSION;  Surgeon: Redell GORMAN Shallow, MD;  Location: [facility] ENDOSCOPY;  Service: Cardiovascular;  Laterality: N/A;   CARDIOVERSION N/A 09/08/2016   Procedure: CARDIOVERSION;  Surgeon: Vina LULLA Gull, MD;  Location: [facility] ENDOSCOPY;  Service: Cardiovascular;  Laterality: N/A;   CORONARY ANGIOPLASTY     STENT 2016  [facility]   CORONARY ANGIOPLASTY WITH STENT PLACEMENT     DES in SVG to right coronary artery system   CORONARY ARTERY BYPASS GRAFT  10/13/2005   LIMA to LAD, SVG to intermediate branch, sequential SVG to PDA and RPL   CORONARY ARTERY BYPASS GRAFT N/A 07/11/2016   Procedure: REDO CORONARY ARTERY BYPASS GRAFTING (CABG) x two using left leg greater saphenous vein harvested endoscopically-SVG to PDA -SVG to RAMUS INTERMEDIATE;  Surgeon: Sudie VEAR Laine, MD;  Location: [facility] OR;  Service: Open Heart Surgery;  Laterality: N/A;   CORONARY ARTERY BYPASS GRAFT N/A 07/11/2016   Procedure:  Re-exploration (CABG) for post op bleeding,;  Surgeon: Sudie VEAR Laine, MD;  Location: [facility] OR;  Service: Open Heart Surgery;  Laterality: N/A;   EP IMPLANTABLE DEVICE N/A 07/25/2016   Procedure: BiV ICD Insertion CRT-D;  Surgeon: Will Lunger Inocencio, MD;  Location: [facility] INVASIVE CV LAB;  Service: Cardiovascular;  Laterality: N/A;   INGUINAL HERNIA REPAIR Left 08/20/2017   INGUINAL HERNIA REPAIR Left 08/20/2017   Procedure: OPEN LEFT INGUINAL HERNIA REPAIR;  Surgeon: Ethyl Lenis, MD;  Location: [facility] OR;  Service: General;  Laterality: Left;   IR GASTROSTOMY TUBE MOD SED  04/09/2024   IR IMAGING GUIDED PORT INSERTION  04/09/2024   LEFT HEART CATH AND CORS/GRAFTS ANGIOGRAPHY N/A 12/18/2016   Procedure: Left Heart Cath and Cors/Grafts Angiography;  Surgeon: Ozell Fell, MD;  Location: [facility] INVASIVE CV LAB;  Service: Cardiovascular;  Laterality: N/A;   LEFT HEART CATH AND CORS/GRAFTS ANGIOGRAPHY N/A 08/22/2017   Procedure: LEFT HEART CATH AND CORS/GRAFTS ANGIOGRAPHY;  Surgeon: Rolan Ezra RAMAN, MD;  Location: [facility] INVASIVE CV LAB;  Service: Cardiovascular;  Laterality: N/A;   MITRAL VALVE REPLACEMENT N/A 07/11/2016   Procedure: MITRAL VALVE (MV) REPLACEMENT;  Surgeon: Sudie VEAR Laine, MD;  Location: [facility] OR;  Service: Open Heart Surgery;  Laterality: N/A;   MYOCARDICAL PERFUSION  10/09/2007   NORMAL PERFUSION IN ALL REGIONS;NO EVIDENCE OF INDUCIBLE ISCHEMIA;POST STRESS EF% 66   RENAL ARTERY STENT Right 2007   RENAL DOPPLER  03/28/2010   RIGHT RA-NORMAL;LEFT PROXIMAL RA AT STENT-PATENT WITH NO EVIDENCE OF SIGN DIAMETER REDUCTION. R & L KIDNEYS: EQUAL IN SIZE,SYMMETRICAL IN SHAPE.   RIGHT HEART CATH N/A 01/08/2020   Procedure: RIGHT HEART CATH;  Surgeon: Rolan Ezra RAMAN, MD;  Location: [facility] INVASIVE CV LAB;  Service: Cardiovascular;  Laterality: N/A;   TEE WITHOUT CARDIOVERSION N/A 06/15/2016   Procedure: TRANSESOPHAGEAL ECHOCARDIOGRAM (TEE);  Surgeon: Jerel Balding, MD;  Location: [facility] ENDOSCOPY;  Service:  Cardiovascular;  Laterality: N/A;   TEE WITHOUT CARDIOVERSION N/A 07/11/2016   Procedure: TRANSESOPHAGEAL ECHOCARDIOGRAM (TEE);  Surgeon: Sudie VEAR Laine, MD;  Location: [facility] OR;  Service: Open Heart Surgery;  Laterality: N/A;   TEE WITHOUT CARDIOVERSION N/A 07/19/2016   Procedure: TRANSESOPHAGEAL ECHOCARDIOGRAM (TEE);  Surgeon: Redell RAMAN Shallow, MD;  Location: [facility] ENDOSCOPY;  Service: Cardiovascular;  Laterality: N/A;   TRANSESOPHAGEAL ECHOCARDIOGRAM  10/19/2005   NORMAL LV; MILD TO MODERATE AMOUNT OF SOFT ATHEROMATOUS PLAQUE OF THE THORACIC AORTA; THE LEFT ATRIUM IS MILDLY DILATED;LEFT ATRIAL APPENDAGE FUNCTION IS NORMAL;NO THROMBUS IDENTIFIED. SMALL PFO WITH RIGHT TO LEFT SHUNT    Family History  Problem Relation Age of Onset   Heart attack Mother    Heart disease Mother    Heart attack Father    Heart disease Father    Hypertension Father    Hypertension Sister    Heart disease Maternal Grandmother     Prior to Admission medications   Medication Sig Start Date End Date Taking? Authorizing Provider  acetaminophen  (TYLENOL ) 325 MG tablet Take 325 mg by mouth every 6 (six) hours as needed for mild pain (pain score 1-3).    [provider]  apixaban  (ELIQUIS ) 5 MG TABS tablet Take 1 tablet (5 mg total) by mouth 2 (two) times daily. 01/23/24   Croitoru, Mihai, MD  Blood Glucose Monitoring Suppl (ACCU-CHEK GUIDE ME) w/Device KIT 1 Piece by Does not apply route as directed. 02/01/22   Nida, Gebreselassie W, MD  carvedilol  (COREG ) 12.5 MG tablet TAKE 1 & 1/2 (ONE & ONE-HALF) TABLETS BY MOUTH TWICE DAILY WITH A MEAL 02/20/24   Rolan Ezra RAMAN, MD  Cholecalciferol  (VITAMIN D ) 50 MCG (2000 UT) tablet Take 2,000 Units by mouth daily with breakfast.    [provider]  dapagliflozin  propanediol (FARXIGA ) 10 MG TABS tablet Take 1 tablet (10 mg total) by mouth daily. 12/26/23   Croitoru, Mihai, MD  dexamethasone  (DECADRON ) 4 MG tablet Take 2 tablets daily for 2 days, start the day after  chemotherapy. Take with food. 03/21/24   Pasam, Chinita, MD  digoxin  (LANOXIN ) 0.125 MG tablet Take 1/2 (one-half) tablet by mouth once daily 12/26/23   Croitoru, Mihai, MD  eplerenone  (INSPRA ) 50 MG tablet Take 1 tablet (50 mg total) by mouth daily. 06/11/23   Rolan Ezra RAMAN, MD  fluconazole  (DIFLUCAN ) 10 MG/ML suspension Take 20mL per tube today, then 10mL  per tube daily for 20 more days. HOLD ROSUVASTATIN  WHILE ON THIS. 04/28/24   Izell Domino, MD  furosemide  (LASIX ) 20 MG tablet Take 1 tablet (20 mg total) by mouth daily as needed (for a weight over 190 lbs). 12/26/23   Croitoru, Jerel, MD  glipiZIDE  (GLUCOTROL ) 5 MG tablet Take 1 tablet (5 mg total) by mouth daily before breakfast. Only one time daily at breakfast. 04/30/24   Nida, Ethelle ORN, MD  glucose blood (ACCU-CHEK GUIDE TEST) test strip Use to test blood glucose daily as instructed 03/17/24   Nida, Gebreselassie W, MD  guaiFENesin  300 MG/15ML LIQD Place 600 mg into feeding tube 4 (four) times daily as needed. 05/01/24   Boscia, Heather E, NP  levothyroxine  (SYNTHROID ) 50 MCG tablet Take 1 tablet (50 mcg total) by mouth daily before breakfast. 03/19/24   Nida, Gebreselassie W, MD  lidocaine  (XYLOCAINE ) 2 % solution Patient: Mix 1part 2% viscous lidocaine , 1part H20. Swish & swallow 10mL of diluted mixture, 30min before meals and at bedtime, up to QID 04/15/24   Izell Domino, MD  lidocaine -prilocaine  (EMLA ) cream Apply to affected area once 03/21/24   Pasam, Avinash, MD  neomycin-bacitracin-polymyxin (NEOSPORIN) ointment Apply 1 application  topically daily as needed for wound care.    [provider]  nitroGLYCERIN  (NITROSTAT ) 0.4 MG SL tablet DISSOLVE 1 TABLET UNDER THE TONGUE EVERY 5 MINUTES AS NEEDED FOR CHEST PAIN, DO NOT EXCEED 3 DOSES IN 15 MINUTES 12/26/23   Croitoru, Mihai, MD  Nutritional Supplements (FEEDING SUPPLEMENT, OSMOLITE 1.5 CAL,) LIQD Give 2 cartons at 8am, noon, 4pm bolus syringe and 1 carton at 8pm.  Flush with 60ml  of water before and after each feeding.  Give additional 240ml TID between feedings for additional hydration. 04/28/24   Izell Domino, MD  ondansetron  (ZOFRAN ) 8 MG tablet Take 1 tablet (8 mg total) by mouth every 8 (eight) hours as needed for nausea or vomiting. Start on the third day after chemotherapy. 03/21/24   Pasam, Chinita, MD  oxyCODONE  (ROXICODONE ) 5 MG/5ML solution Place 5 mLs (5 mg total) into feeding tube every 6 (six) hours as needed for severe pain (pain score 7-10). 05/01/24   Boscia, Heather E, NP  polyethylene glycol (MIRALAX ) 17 g packet Take 17 g by mouth daily as needed for mild constipation or moderate constipation.    [provider]  prochlorperazine  (COMPAZINE ) 10 MG tablet Take 1 tablet (10 mg total) by mouth every 6 (six) hours as needed for nausea or vomiting. 03/21/24   Pasam, Avinash, MD  rosuvastatin  (CRESTOR ) 10 MG tablet Take 1 tablet (10 mg total) by mouth daily. 06/11/23   Rolan Ezra RAMAN, MD  sacubitril -valsartan  (ENTRESTO ) 97-103 MG Take 1 tablet by mouth twice daily 04/07/24   McLean, Dalton S, MD  sodium zirconium cyclosilicate  (LOKELMA ) 10 g PACK packet DISSOLVE 1 PACKET IN WATER & DRINK ONCE DAILY 09/24/23   Croitoru, Jerel, MD  Tetrahydrozoline HCl (VISINE OP) Place 1 drop into both eyes daily as needed (irritation).    [provider]    Current Facility-Administered Medications  Medication Dose Route Frequency Provider Last Rate Last Admin   acetaminophen  (TYLENOL ) tablet 650 mg  650 mg Oral Q6H PRN Patel, Vishal R, MD       Or   acetaminophen  (TYLENOL ) suppository 650 mg  650 mg Rectal Q6H PRN Patel, Vishal R, MD       carvedilol  (COREG ) tablet 18.75 mg  18.75 mg Per Tube BID WC Tobie Jorie SAUNDERS, MD  Chlorhexidine  Gluconate Cloth 2 % PADS 6 each  6 each Topical Daily Ghimire, Donalda HERO, MD       fluconazole  (DIFLUCAN ) tablet 100 mg  100 mg Per Tube Daily Tobie Jorie SAUNDERS, MD       insulin  aspart (novoLOG ) injection 0-9 Units  0-9 Units  Subcutaneous Q4H Tobie Jorie SAUNDERS, MD   1 Units at 05/07/24 0019   levothyroxine  (SYNTHROID ) tablet 50 mcg  50 mcg Per Tube QAC breakfast Tobie Jorie SAUNDERS, MD   50 mcg at 05/07/24 9360   ondansetron  (ZOFRAN ) injection 4 mg  4 mg Intravenous Q6H PRN Tobie Jorie SAUNDERS, MD       oxyCODONE  (ROXICODONE ) 5 MG/5ML solution 5 mg  5 mg Per Tube Q6H PRN Patel, Vishal R, MD   5 mg at 05/07/24 0019   senna-docusate (Senokot-S) tablet 1 tablet  1 tablet Oral QHS PRN Tobie Jorie SAUNDERS, MD       sodium chloride  flush (NS) 0.9 % injection 10-40 mL  10-40 mL Intracatheter Q12H Ghimire, Donalda HERO, MD       sodium chloride  flush (NS) 0.9 % injection 10-40 mL  10-40 mL Intracatheter PRN Raenelle Donalda HERO, MD       sodium chloride  flush (NS) 0.9 % injection 3 mL  3 mL Intravenous Q12H Tobie Jorie R, MD   3 mL at 05/06/24 2147    Allergies as of 05/06/2024 - Review Complete 05/06/2024  Allergen Reaction Noted   Xanax [alprazolam] Other (See Comments) 08/26/2013    Social History   Socioeconomic History   Marital status: Married    Spouse name: Not on file   Number of children: 0   Years of education: 18   Highest education level: Not on file  Occupational History   Occupation: retired  Tobacco Use   Smoking status: Never   Smokeless tobacco: Never  Vaping Use   Vaping status: Never Used  Substance and Sexual Activity   Alcohol use: No   Drug use: No   Sexual activity: Never  Other Topics Concern   Not on file  Social History Narrative   Right handed   Two story home   Drinks half and half coffee   Social Drivers of Health   Financial Resource Strain: Not on file  Food Insecurity: No Food Insecurity (05/07/2024)   Hunger Vital Sign    Worried About Running Out of Food in the Last Year: Never true    Ran Out of Food in the Last Year: Never true  Transportation Needs: No Transportation Needs (05/07/2024)   PRAPARE - Administrator, Civil Service (Medical): No    Lack of Transportation  (Non-Medical): No  Physical Activity: Not on file  Stress: Not on file  Social Connections: Moderately Integrated (05/07/2024)   Social Connection and Isolation Panel    Frequency of Communication with Friends and Family: Once a week    Frequency of Social Gatherings with Friends and Family: Twice a week    Attends Religious Services: More than 4 times per year    Active Member of [employer] or Organizations: No    Attends Banker Meetings: Never    Marital Status: Married  Catering manager Violence: Not At Risk (05/07/2024)   Humiliation, Afraid, Rape, and Kick questionnaire    Fear of Current or Ex-Partner: No    Emotionally Abused: No    Physically Abused: No    Sexually Abused: No     Code Status  Code Status: Full Code  Review of Systems: All systems reviewed and negative except where noted in HPI.  Physical Exam: Vital signs in last 24 hours: Temp:  [98.1 F (36.7 C)-99.2 F (37.3 C)] 99.2 F (37.3 C) (08/06 0742) Pulse Rate:  [39-92] 57 (08/06 0342) Resp:  [14-24] 24 (08/06 0342) BP: (96-127)/(50-57) 122/50 (08/06 0742) SpO2:  [93 %-100 %] 93 % (08/06 0342) Weight:  [86 kg] 86 kg (08/05 1624) Last BM Date : 05/06/24  General:  Pleasant male in NAD Psych:  Cooperative. Normal mood and affect Eyes: Pupils equal Ears:  Normal auditory acuity Nose: No deformity, discharge or lesions Neck:  Supple, no masses felt Lungs:  Clear to auscultation.  Heart:  Regular rate, regular rhythm.  Abdomen:  Soft, nondistended, nontender, active bowel sounds, no masses felt. G-tube site looks health.  Rectal : Large external hemorrhoids, partially reducible.  Light brown stool in vault. Msk: Symmetrical without gross deformities.  Neurologic:  Alert, oriented, grossly normal neurologically Extremities : No edema Skin:  Intact without significant lesions.    Intake/Output from previous day: No intake/output data recorded. Intake/Output this shift:  No intake/output  data recorded.   Vina Dasen, NP-C   05/07/2024, 9:35 AM

## 2024-05-08 ENCOUNTER — Ambulatory Visit
Admission: RE | Admit: 2024-05-08 | Discharge: 2024-05-08 | Disposition: A | Discharge status: Home / Self Care | Source: Ambulatory Visit | Attending: Radiation Oncology | Admitting: Radiation Oncology

## 2024-05-08 ENCOUNTER — Inpatient Hospital Stay (HOSPITAL_COMMUNITY)

## 2024-05-08 ENCOUNTER — Inpatient Hospital Stay

## 2024-05-08 ENCOUNTER — Inpatient Hospital Stay: Admitting: Dietician

## 2024-05-08 ENCOUNTER — Inpatient Hospital Stay: Admitting: Nurse Practitioner

## 2024-05-08 ENCOUNTER — Ambulatory Visit: Admitting: Physical Therapy

## 2024-05-08 ENCOUNTER — Observation Stay (HOSPITAL_COMMUNITY)

## 2024-05-08 ENCOUNTER — Ambulatory Visit

## 2024-05-08 ENCOUNTER — Inpatient Hospital Stay: Attending: Oncology

## 2024-05-08 DIAGNOSIS — I472 Ventricular tachycardia, unspecified: Secondary | ICD-10-CM | POA: Diagnosis not present

## 2024-05-08 DIAGNOSIS — C090 Malignant neoplasm of tonsillar fossa: Secondary | ICD-10-CM | POA: Diagnosis present

## 2024-05-08 DIAGNOSIS — I5023 Acute on chronic systolic (congestive) heart failure: Secondary | ICD-10-CM | POA: Diagnosis present

## 2024-05-08 DIAGNOSIS — R578 Other shock: Secondary | ICD-10-CM | POA: Diagnosis not present

## 2024-05-08 DIAGNOSIS — I701 Atherosclerosis of renal artery: Secondary | ICD-10-CM | POA: Diagnosis present

## 2024-05-08 DIAGNOSIS — K921 Melena: Secondary | ICD-10-CM | POA: Diagnosis present

## 2024-05-08 DIAGNOSIS — R918 Other nonspecific abnormal finding of lung field: Secondary | ICD-10-CM | POA: Diagnosis not present

## 2024-05-08 DIAGNOSIS — I442 Atrioventricular block, complete: Secondary | ICD-10-CM | POA: Diagnosis present

## 2024-05-08 DIAGNOSIS — D61818 Other pancytopenia: Secondary | ICD-10-CM | POA: Diagnosis present

## 2024-05-08 DIAGNOSIS — I2510 Atherosclerotic heart disease of native coronary artery without angina pectoris: Secondary | ICD-10-CM | POA: Diagnosis not present

## 2024-05-08 DIAGNOSIS — Z23 Encounter for immunization: Secondary | ICD-10-CM | POA: Diagnosis present

## 2024-05-08 DIAGNOSIS — E1122 Type 2 diabetes mellitus with diabetic chronic kidney disease: Secondary | ICD-10-CM | POA: Diagnosis present

## 2024-05-08 DIAGNOSIS — N179 Acute kidney failure, unspecified: Secondary | ICD-10-CM | POA: Diagnosis not present

## 2024-05-08 DIAGNOSIS — R64 Cachexia: Secondary | ICD-10-CM | POA: Diagnosis present

## 2024-05-08 DIAGNOSIS — D709 Neutropenia, unspecified: Secondary | ICD-10-CM | POA: Diagnosis not present

## 2024-05-08 DIAGNOSIS — I25810 Atherosclerosis of coronary artery bypass graft(s) without angina pectoris: Secondary | ICD-10-CM | POA: Diagnosis present

## 2024-05-08 DIAGNOSIS — R570 Cardiogenic shock: Secondary | ICD-10-CM | POA: Diagnosis present

## 2024-05-08 DIAGNOSIS — K625 Hemorrhage of anus and rectum: Secondary | ICD-10-CM | POA: Diagnosis not present

## 2024-05-08 DIAGNOSIS — I130 Hypertensive heart and chronic kidney disease with heart failure and stage 1 through stage 4 chronic kidney disease, or unspecified chronic kidney disease: Secondary | ICD-10-CM | POA: Diagnosis present

## 2024-05-08 DIAGNOSIS — E119 Type 2 diabetes mellitus without complications: Secondary | ICD-10-CM | POA: Diagnosis not present

## 2024-05-08 DIAGNOSIS — Y842 Radiological procedure and radiotherapy as the cause of abnormal reaction of the patient, or of later complication, without mention of misadventure at the time of the procedure: Secondary | ICD-10-CM | POA: Diagnosis present

## 2024-05-08 DIAGNOSIS — I5084 End stage heart failure: Secondary | ICD-10-CM | POA: Diagnosis present

## 2024-05-08 DIAGNOSIS — D84821 Immunodeficiency due to drugs: Secondary | ICD-10-CM | POA: Diagnosis present

## 2024-05-08 DIAGNOSIS — R579 Shock, unspecified: Secondary | ICD-10-CM | POA: Diagnosis not present

## 2024-05-08 DIAGNOSIS — I252 Old myocardial infarction: Secondary | ICD-10-CM | POA: Diagnosis not present

## 2024-05-08 DIAGNOSIS — D701 Agranulocytosis secondary to cancer chemotherapy: Secondary | ICD-10-CM | POA: Diagnosis present

## 2024-05-08 DIAGNOSIS — N170 Acute kidney failure with tubular necrosis: Secondary | ICD-10-CM | POA: Diagnosis present

## 2024-05-08 DIAGNOSIS — I4891 Unspecified atrial fibrillation: Secondary | ICD-10-CM | POA: Diagnosis not present

## 2024-05-08 DIAGNOSIS — R042 Hemoptysis: Secondary | ICD-10-CM | POA: Diagnosis present

## 2024-05-08 DIAGNOSIS — N1831 Chronic kidney disease, stage 3a: Secondary | ICD-10-CM | POA: Diagnosis present

## 2024-05-08 DIAGNOSIS — E039 Hypothyroidism, unspecified: Secondary | ICD-10-CM | POA: Diagnosis present

## 2024-05-08 DIAGNOSIS — N186 End stage renal disease: Secondary | ICD-10-CM

## 2024-05-08 DIAGNOSIS — F05 Delirium due to known physiological condition: Secondary | ICD-10-CM | POA: Diagnosis not present

## 2024-05-08 DIAGNOSIS — A419 Sepsis, unspecified organism: Secondary | ICD-10-CM

## 2024-05-08 DIAGNOSIS — B370 Candidal stomatitis: Secondary | ICD-10-CM | POA: Diagnosis present

## 2024-05-08 DIAGNOSIS — E43 Unspecified severe protein-calorie malnutrition: Secondary | ICD-10-CM | POA: Diagnosis present

## 2024-05-08 DIAGNOSIS — Z953 Presence of xenogenic heart valve: Secondary | ICD-10-CM | POA: Diagnosis not present

## 2024-05-08 DIAGNOSIS — I4892 Unspecified atrial flutter: Secondary | ICD-10-CM | POA: Diagnosis present

## 2024-05-08 LAB — BASIC METABOLIC PANEL WITH GFR
Anion gap: 9 (ref 5–15)
BUN: 43 mg/dL — ABNORMAL HIGH (ref 8–23)
CO2: 25 mmol/L (ref 22–32)
Calcium: 8.4 mg/dL — ABNORMAL LOW (ref 8.9–10.3)
Chloride: 98 mmol/L (ref 98–111)
Creatinine, Ser: 1.81 mg/dL — ABNORMAL HIGH (ref 0.61–1.24)
GFR, Estimated: 38 mL/min — ABNORMAL LOW (ref >60)
Glucose, Bld: 266 mg/dL — ABNORMAL HIGH (ref 70–99)
Potassium: 4.5 mmol/L (ref 3.5–5.1)
Sodium: 132 mmol/L — ABNORMAL LOW (ref 135–145)

## 2024-05-08 LAB — POCT I-STAT 7, (LYTES, BLD GAS, ICA,H+H)
Acid-Base Excess: 1 mmol/L (ref 0.0–2.0)
Bicarbonate: 24.8 mmol/L (ref 20.0–28.0)
Calcium, Ion: 1.18 mmol/L (ref 1.15–1.40)
HCT: 46 % (ref 39.0–52.0)
Hemoglobin: 15.6 g/dL (ref 13.0–17.0)
O2 Saturation: 98 %
Patient temperature: 98
Potassium: 4.1 mmol/L (ref 3.5–5.1)
Sodium: 133 mmol/L — ABNORMAL LOW (ref 135–145)
TCO2: 26 mmol/L (ref 22–32)
pCO2 arterial: 36.6 mmHg (ref 32–48)
pH, Arterial: 7.437 (ref 7.35–7.45)
pO2, Arterial: 92 mmHg (ref 83–108)

## 2024-05-08 LAB — HEPATIC FUNCTION PANEL
ALT: 17 U/L (ref 0–44)
AST: 15 U/L (ref 15–41)
Albumin: 2.7 g/dL — ABNORMAL LOW (ref 3.5–5.0)
Alkaline Phosphatase: 34 U/L — ABNORMAL LOW (ref 38–126)
Bilirubin, Direct: 1.9 mg/dL — ABNORMAL HIGH (ref 0.0–0.2)
Indirect Bilirubin: 2 mg/dL — ABNORMAL HIGH (ref 0.3–0.9)
Total Bilirubin: 3.9 mg/dL — ABNORMAL HIGH (ref 0.0–1.2)
Total Protein: 5.4 g/dL — ABNORMAL LOW (ref 6.5–8.1)

## 2024-05-08 LAB — CBC WITH DIFFERENTIAL/PLATELET
Abs Immature Granulocytes: 0 K/uL (ref 0.00–0.07)
Basophils Absolute: 0 K/uL (ref 0.0–0.1)
Basophils Relative: 3 %
Eosinophils Absolute: 0 K/uL (ref 0.0–0.5)
Eosinophils Relative: 0 %
HCT: 29.7 % — ABNORMAL LOW (ref 39.0–52.0)
Hemoglobin: 10.2 g/dL — ABNORMAL LOW (ref 13.0–17.0)
Immature Granulocytes: 0 %
Lymphocytes Relative: 5 %
Lymphs Abs: 0 K/uL — ABNORMAL LOW (ref 0.7–4.0)
MCH: 31 pg (ref 26.0–34.0)
MCHC: 34.3 g/dL (ref 30.0–36.0)
MCV: 90.3 fL (ref 80.0–100.0)
Monocytes Absolute: 0.1 K/uL (ref 0.1–1.0)
Monocytes Relative: 6 %
Neutro Abs: 0.7 K/uL — ABNORMAL LOW (ref 1.7–7.7)
Neutrophils Relative %: 86 %
Platelets: 47 K/uL — ABNORMAL LOW (ref 150–400)
RBC: 3.29 MIL/uL — ABNORMAL LOW (ref 4.22–5.81)
RDW: 12.2 % (ref 11.5–15.5)
Smear Review: DECREASED
WBC: 0.8 K/uL — CL (ref 4.0–10.5)
nRBC: 0 % (ref 0.0–0.2)

## 2024-05-08 LAB — URINALYSIS, W/ REFLEX TO CULTURE (INFECTION SUSPECTED)
Bilirubin Urine: NEGATIVE
Glucose, UA: NEGATIVE mg/dL
Ketones, ur: 5 mg/dL — AB
Leukocytes,Ua: NEGATIVE
Nitrite: NEGATIVE
Protein, ur: 100 mg/dL — AB
Specific Gravity, Urine: 1.02 (ref 1.005–1.030)
pH: 5 (ref 5.0–8.0)

## 2024-05-08 LAB — GLUCOSE, CAPILLARY
Glucose-Capillary: 241 mg/dL — ABNORMAL HIGH (ref 70–99)
Glucose-Capillary: 214 mg/dL — ABNORMAL HIGH (ref 70–99)
Glucose-Capillary: 190 mg/dL — ABNORMAL HIGH (ref 70–99)
Glucose-Capillary: 231 mg/dL — ABNORMAL HIGH (ref 70–99)
Glucose-Capillary: 223 mg/dL — ABNORMAL HIGH (ref 70–99)

## 2024-05-08 LAB — LACTIC ACID, PLASMA
Lactic Acid, Venous: 2.2 mmol/L (ref 0.5–1.9)
Lactic Acid, Venous: 1.9 mmol/L (ref 0.5–1.9)

## 2024-05-08 LAB — ECHOCARDIOGRAM COMPLETE
AV Mean grad: 8.7 mmHg
AV Peak grad: 16.1 mmHg
Ao pk vel: 2.01 m/s
Calc EF: 10 %
Est EF: <20
Height: 72 in
S' Lateral: 7.1 cm
Single Plane A2C EF: 0.5 %
Single Plane A4C EF: 17.8 %
Weight: 2955.93 [oz_av]

## 2024-05-08 LAB — MAGNESIUM: Magnesium: 1.6 mg/dL — ABNORMAL LOW (ref 1.7–2.4)

## 2024-05-08 LAB — TROPONIN I (HIGH SENSITIVITY): Troponin I (High Sensitivity): 40 ng/L — ABNORMAL HIGH (ref <18)

## 2024-05-08 LAB — MRSA NEXT GEN BY PCR, NASAL: MRSA by PCR Next Gen: NOT DETECTED

## 2024-05-08 LAB — PHOSPHORUS: Phosphorus: 3.4 mg/dL (ref 2.5–4.6)

## 2024-05-08 LAB — PROCALCITONIN: Procalcitonin: 9.19 ng/mL

## 2024-05-08 LAB — CORTISOL: Cortisol, Plasma: 54.4 ug/dL

## 2024-05-08 LAB — BRAIN NATRIURETIC PEPTIDE: B Natriuretic Peptide: 3366.2 pg/mL — ABNORMAL HIGH (ref 0.0–100.0)

## 2024-05-08 LAB — DIGOXIN LEVEL: Digoxin Level: 0.5 ng/mL — ABNORMAL LOW (ref 0.8–2.0)

## 2024-05-08 MED ORDER — MAGNESIUM SULFATE 4 GM/100ML IV SOLN
4.0000 g | Freq: Once | INTRAVENOUS | Status: AC
  Administered 2024-05-08: 4 g via INTRAVENOUS
  Filled 2024-05-08: qty 100

## 2024-05-08 MED ORDER — PERFLUTREN LIPID MICROSPHERE
1.0000 mL | INTRAVENOUS | Status: AC | PRN
  Administered 2024-05-08: 1.5 mL via INTRAVENOUS

## 2024-05-08 MED ORDER — NOREPINEPHRINE 4 MG/250ML-% IV SOLN
0.0000 ug/min | INTRAVENOUS | Status: DC
  Administered 2024-05-08: 13.013 ug/min via INTRAVENOUS
  Administered 2024-05-08 – 2024-05-09 (×2): 13 ug/min via INTRAVENOUS
  Administered 2024-05-09: 14 ug/min via INTRAVENOUS
  Administered 2024-05-09 (×2): 13 ug/min via INTRAVENOUS
  Administered 2024-05-09: 17 ug/min via INTRAVENOUS
  Filled 2024-05-08 (×7): qty 250

## 2024-05-08 MED ORDER — FILGRASTIM-AAFI 480 MCG/0.8ML IJ SOSY
480.0000 ug | PREFILLED_SYRINGE | Freq: Every day | INTRAMUSCULAR | Status: DC
  Administered 2024-05-08 – 2024-05-10 (×2): 480 ug via SUBCUTANEOUS
  Filled 2024-05-08 (×3): qty 0.8

## 2024-05-08 MED ORDER — VANCOMYCIN HCL 1750 MG/350ML IV SOLN
1750.0000 mg | Freq: Once | INTRAVENOUS | Status: AC
  Administered 2024-05-08: 1750 mg via INTRAVENOUS
  Filled 2024-05-08: qty 350

## 2024-05-08 MED ORDER — HYDROCORTISONE (PERIANAL) 2.5 % EX CREA
TOPICAL_CREAM | Freq: Two times a day (BID) | CUTANEOUS | Status: DC
  Administered 2024-05-08 – 2024-05-17 (×7): 1 via RECTAL
  Filled 2024-05-08 (×2): qty 28.35

## 2024-05-08 MED ORDER — PANTOPRAZOLE SODIUM 40 MG IV SOLR
40.0000 mg | INTRAVENOUS | Status: DC
  Administered 2024-05-08 – 2024-05-15 (×11): 40 mg via INTRAVENOUS
  Filled 2024-05-08 (×8): qty 10

## 2024-05-08 MED ORDER — VANCOMYCIN VARIABLE DOSE PER UNSTABLE RENAL FUNCTION (PHARMACIST DOSING): Status: DC

## 2024-05-08 MED ORDER — INSULIN ASPART 100 UNIT/ML IJ SOLN
0.0000 [IU] | INTRAMUSCULAR | Status: DC
  Administered 2024-05-08 (×2): 5 [IU] via SUBCUTANEOUS
  Administered 2024-05-09 (×3): 3 [IU] via SUBCUTANEOUS

## 2024-05-08 NOTE — Progress Notes (Signed)
 Pharmacy Electrolyte Replacement  Recent Labs:  Recent Labs    05/08/24 0425 05/08/24 0434  K 4.5  --   MG  --  1.6*  PHOS 3.4  --   CREATININE 1.81*  --     Low Critical Values (K </= 2.5, Phos </= 1, Mg </= 1) Present: None  MD Contacted: N/A  Plan: Give IV magnesium  sulfate 4g x 1 dose per protocol.  Maurilio Patten, PharmD PGY1 Pharmacy Resident [facility] 05/08/2024 11:00 AM

## 2024-05-08 NOTE — Progress Notes (Addendum)
 Pharmacy Antibiotic Note  Christian Bowman is a 78 y.o. male admitted on 05/06/2024 with sepsis.  Pharmacy has been consulted for vancomycin  dosing. Patient is also neutropenic with ANC 700; currently afebrile. Procalcitonin elevated at 9.19. Patient was receiving linezolid  and Zosyn  for sepsis, but linezolid  is now being switched to vancomycin  in setting of neutropenia and thrombocytopenia. Renal function unstable with rising Scr to 1.81. Patient is also receiving fluconazole  until 8/16 for treatment of esophageal candidiasis.  Plan: -Give loading dose of vancomycin  1750 mg IV now.  -Defer maintenance regimen in setting of unstable renal function. -Check random vancomycin  level with AM labs tomorrow to assess patient-specific clearance of vancomycin  and need for additional dosing.  -Continue to monitor renal function, s/sx infection, and culture results.  Height: 6' (182.9 cm) Weight: 83.8 kg (184 lb 11.9 oz) IBW/kg (Calculated) : 77.6  Temp (24hrs), Avg:98.7 F (37.1 C), Min:98 F (36.7 C), Max:100.3 F (37.9 C)  Recent Labs  Lab 05/01/24 1026 05/06/24 1637 05/06/24 1640 05/07/24 0541 05/07/24 1609 05/08/24 0425  WBC 1.6*  --  2.1* 1.7*  --  0.8*  CREATININE 1.30* 1.59*  --  1.48* 1.60* 1.81*  LATICACIDVEN  --   --   --   --  1.9 2.2*    Estimated Creatinine Clearance: 38.1 mL/min (A) (by C-G formula based on SCr of 1.81 mg/dL (H)).    Allergies  Allergen Reactions   Xanax [Alprazolam] Other (See Comments)    Pt feels very weak, tired and feels paralyzed     Antimicrobials this admission: Linezolid  8/6 x 1 Zosyn  8/6 >>  Vancomycin  8/7 >> Fluconazole  from 7/28 >> 8/16  Dose adjustments this admission: N/A  Microbiology results: 8/6 BCx: in process MRSA PCR: negative 8/6 GI pathogen panel, C dif PCR: pending   Thank you for allowing pharmacy to be a part of this patient's care.  Maurilio Patten, PharmD PGY1 Pharmacy Resident [facility] 05/08/2024 8:27 AM

## 2024-05-08 NOTE — Progress Notes (Signed)
 Christian Bowman   DOB:06/21/1946   FM#:[id number]   RDW#:[id number]  Oncology f/u note   Subjective: Patient is known to our service.  He is under Dr. Autumn and Dr. Charisse care for his tonsil cancer, on concurrent chemoradiation.  I saw him last week with my NP before his chemo.  He was admitted to hospital for rectal bleeding, worsening fatigue.  Due to persistent hypotension, he was transferred from floor to ICU today.  He is on pressor.  I was called by Dr. Theodoro due to his neutropenic fever. He is awake and alert, wife at bedside.   Objective:  Vitals:   05/08/24 1500 05/08/24 1900  BP: (!) 99/44   Pulse: (!) 59   Resp: (!) 25   Temp:  98.6 F (37 C)  SpO2: 93%     Body mass index is 25.06 kg/m.  Intake/Output Summary (Last 24 hours) at 05/08/2024 2154 Last data filed at 05/08/2024 1900 Gross per 24 hour  Intake 3548.43 ml  Output 823 ml  Net 2725.43 ml     Sclerae unicteric  (+) Mucositis  No peripheral adenopathy  Lungs clear -- no rales or rhonchi  Heart regular rate and rhythm  Abdomen benign  MSK no focal spinal tenderness, no peripheral edema  Neuro nonfocal    CBG (last 3)  Recent Labs    05/08/24 1150 05/08/24 1455 05/08/24 1918  GLUCAP 190* 231* 223*     Labs:  Lab Results  Component Value Date   WBC 0.8 (LL) 05/08/2024   HGB 15.6 05/08/2024   HCT 46.0 05/08/2024   MCV 90.3 05/08/2024   PLT 47 (L) 05/08/2024   NEUTROABS 0.7 (L) 05/08/2024     Urine Studies No results for input(s): UHGB, CRYS in the last 72 hours.  Invalid input(s): UACOL, UAPR, USPG, UPH, UTP, UGL, UKET, UBIL, UNIT, UROB, [place], UEPI, TERRAL Christian Bowman Christian Bowman, Christian Bowman  Basic Metabolic Panel: Recent Labs  Lab 05/06/24 1637 05/07/24 0541 05/07/24 1609 05/08/24 0425 05/08/24 0434 05/08/24 1336  NA 134* 135 133* 132*  --  133*  K 5.4* 4.9 5.0 4.5  --  4.1  CL 95* 96* 99 98  --   --   CO2 28 29 25 25   --   --   GLUCOSE 188* 106* 152*  266*  --   --   BUN 58* 48* 47* 43*  --   --   CREATININE 1.59* 1.48* 1.60* 1.81*  --   --   CALCIUM  8.5* 9.0 8.3* 8.4*  --   --   MG  --  1.8  --   --  1.6*  --   PHOS  --   --  2.5 3.4  --   --    GFR Estimated Creatinine Clearance: 38.1 mL/min (A) (by C-G formula based on SCr of 1.81 mg/dL (H)). Liver Function Tests: Recent Labs  Lab 05/06/24 1637 05/07/24 0541 05/07/24 1609 05/08/24 0425  AST 17 18 14* 15  ALT 29 26 18 17   ALKPHOS 52 47 36* 34*  BILITOT 1.5* 1.8* 2.3* 3.9*  PROT 5.6* 5.9* 4.6* 5.4*  ALBUMIN  2.9* 2.8* 2.1* 2.7*   No results for input(s): LIPASE, AMYLASE in the last 168 hours. No results for input(s): AMMONIA in the last 168 hours. Coagulation profile No results for input(s): INR, PROTIME in the last 168 hours.  CBC: Recent Labs  Lab 05/06/24 1640 05/07/24 0541 05/07/24 1609 05/08/24 0425 05/08/24 1336  WBC 2.1* 1.7*  --  0.8*  --   NEUTROABS 1.8 1.4*  --  0.7*  --   HGB 13.0 13.0 11.0* 10.2* 15.6  HCT 39.4 38.7* 32.3* 29.7* 46.0  MCV 93.6 91.9  --  90.3  --   PLT 59* 56*  --  47*  --    Cardiac Enzymes: No results for input(s): CKTOTAL, CKMB, CKMBINDEX, TROPONINI in the last 168 hours. BNP: Invalid input(s): POCBNP CBG: Recent Labs  Lab 05/08/24 0330 05/08/24 0707 05/08/24 1150 05/08/24 1455 05/08/24 1918  GLUCAP 241* 214* 190* 231* 223*   D-Dimer No results for input(s): DDIMER in the last 72 hours. Hgb A1c No results for input(s): HGBA1C in the last 72 hours. Lipid Profile No results for input(s): CHOL, HDL, LDLCALC, TRIG, CHOLHDL, LDLDIRECT in the last 72 hours. Thyroid  function studies No results for input(s): TSH, T4TOTAL, T3FREE, THYROIDAB in the last 72 hours.  Invalid input(s): FREET3 Anemia work up No results for input(s): VITAMINB12, FOLATE, FERRITIN, TIBC, IRON, RETICCTPCT in the last 72 hours. Microbiology Recent Results (from the past 240 hours)  Culture,  blood (Routine X 2) w Reflex to ID Panel     Status: None (Preliminary result)   Collection Time: 05/07/24  4:08 PM   Specimen: BLOOD LEFT HAND  Result Value Ref Range Status   Specimen Description BLOOD LEFT HAND  Final   Special Requests   Final    BOTTLES DRAWN AEROBIC ONLY Blood Culture adequate volume   Culture   Final    NO GROWTH < 24 HOURS Performed at [facility], [address], [place], [place] [address]    Report Status PENDING  Incomplete  Culture, blood (Routine X 2) w Reflex to ID Panel     Status: None (Preliminary result)   Collection Time: 05/07/24  4:09 PM   Specimen: BLOOD RIGHT HAND  Result Value Ref Range Status   Specimen Description BLOOD RIGHT HAND  Final   Special Requests   Final    BOTTLES DRAWN AEROBIC ONLY Blood Culture adequate volume   Culture   Final    NO GROWTH < 24 HOURS Performed at [facility], [address], [place], [place] [address]    Report Status PENDING  Incomplete  MRSA Next Gen by PCR, Nasal     Status: None   Collection Time: 05/07/24 11:36 PM   Specimen: Urine, Catheterized; Nasal Swab  Result Value Ref Range Status   MRSA by PCR Next Gen NOT DETECTED NOT DETECTED Final    Comment: (NOTE) The GeneXpert MRSA Assay (FDA approved for NASAL specimens only), is one component of a comprehensive MRSA colonization surveillance program. It is not intended to diagnose MRSA infection nor to guide or monitor treatment for MRSA infections. Test performance is not FDA approved in patients less than 62 years old. Performed at [facility], [address], [place], [place] [address]       Studies:  ECHOCARDIOGRAM COMPLETE Result Date: 05/08/2024    ECHOCARDIOGRAM REPORT   Patient Name:   Christian Bowman Date of Exam: 05/08/2024 Medical Rec #:  [id number]    Height:       72.0 in Accession #:    [id number]   Weight:       184.7 lb Date of Birth:  October 04, 1945     BSA:          2.060 m Patient Age:    78 years     BP:  90/37 mmHg Patient Gender: M            HR:           78 bpm. Exam Location:  Inpatient Procedure: 2D Echo, Cardiac Doppler, Color Doppler and Intracardiac            Opacification Agent (Both Spectral and Color Flow Doppler were            utilized during procedure). Indications:    Shock  History:        Patient has prior history of Echocardiogram examinations.                  Mitral Valve: bioprosthetic valve valve is present in the mitral                 position.  Sonographer:    Vella Key Referring Phys: [id number] DONALDA M GHIMIRE IMPRESSIONS  1. Left ventricular ejection fraction, by estimation, is <20%. The left ventricle has severely decreased function. The left ventricle demonstrates global hypokinesis. The left ventricular internal cavity size was severely dilated. There is mild concentric left ventricular hypertrophy. Left ventricular diastolic function could not be evaluated. No LV thrombus.  2. Right ventricular systolic function is normal. The right ventricular size is severely enlarged. Tricuspid regurgitation signal is inadequate for assessing PA pressure.  3. Right atrial size was moderately dilated.  4. The mitral valve has been repaired/replaced. No evidence of mitral valve regurgitation. No evidence of mitral stenosis. The mean mitral valve gradient is 6.5 mmHg. There is a bioprosthetic valve present in the mitral position.  5. Tricuspid valve regurgitation is moderate.  6. The aortic valve is tricuspid. Aortic valve regurgitation is not visualized. Aortic valve sclerosis/calcification is present, without any evidence of aortic stenosis. Aortic valve mean gradient measures 8.7 mmHg. Aortic valve Vmax measures 2.01 m/s.  7. The inferior vena cava is dilated in size with <50% respiratory variability, suggesting right atrial pressure of 15 mmHg. FINDINGS  Left Ventricle: Left ventricular ejection fraction, by estimation, is <20%. The left ventricle has severely decreased function. The left ventricle  demonstrates global hypokinesis. The left ventricular internal cavity size was severely dilated. There is mild concentric left ventricular hypertrophy. Left ventricular diastolic function could not be evaluated. Right Ventricle: The right ventricular size is severely enlarged. No increase in right ventricular wall thickness. Right ventricular systolic function is normal. Tricuspid regurgitation signal is inadequate for assessing PA pressure. Left Atrium: Left atrial size was normal in size. Right Atrium: Right atrial size was moderately dilated. Pericardium: There is no evidence of pericardial effusion. Mitral Valve: The mitral valve has been repaired/replaced. No evidence of mitral valve regurgitation. There is a bioprosthetic valve present in the mitral position. No evidence of mitral valve stenosis. MV peak gradient, 9.4 mmHg. The mean mitral valve gradient is 6.5 mmHg. Tricuspid Valve: The tricuspid valve is normal in structure. Tricuspid valve regurgitation is moderate . No evidence of tricuspid stenosis. Aortic Valve: The aortic valve is tricuspid. Aortic valve regurgitation is not visualized. Aortic valve sclerosis/calcification is present, without any evidence of aortic stenosis. Aortic valve mean gradient measures 8.7 mmHg. Aortic valve peak gradient measures 16.1 mmHg. Pulmonic Valve: The pulmonic valve was normal in structure. Pulmonic valve regurgitation is mild. No evidence of pulmonic stenosis. Aorta: The aortic root is normal in size and structure. Venous: The inferior vena cava is dilated in size with less than 50% respiratory variability, suggesting right atrial pressure of 15 mmHg. IAS/Shunts: No atrial  level shunt detected by color flow Doppler.  LEFT VENTRICLE PLAX 2D LVIDd:         6.96 cm LVIDs:         7.10 cm LV PW:         1.31 cm LV IVS:        1.26 cm LVOT diam:     1.21 cm LVOT Area:     1.15 cm  LV Volumes (MOD) LV vol d, MOD A2C: 376.0 ml LV vol d, MOD A4C: 287.0 ml LV vol s, MOD A2C:  374.0 ml LV vol s, MOD A4C: 236.0 ml LV SV MOD A2C:     2.0 ml LV SV MOD A4C:     287.0 ml LV SV MOD BP:      35.4 ml RIGHT VENTRICLE RV Basal diam:  4.90 cm LEFT ATRIUM           Index        RIGHT ATRIUM           Index LA diam:      3.88 cm 1.88 cm/m   RA Area:     22.70 cm LA Vol (A4C): 80.5 ml 39.08 ml/m  RA Volume:   74.50 ml  36.17 ml/m  AORTIC VALVE AV Vmax:      200.67 cm/s AV Vmean:     136.000 cm/s AV VTI:       0.324 m AV Peak Grad: 16.1 mmHg AV Mean Grad: 8.7 mmHg  AORTA Ao Root diam: 3.55 cm Ao Asc diam:  3.15 cm MITRAL VALVE MV Peak grad: 9.4 mmHg   SHUNTS MV Mean grad: 6.5 mmHg   Systemic Diam: 1.21 cm MV Vmax:      1.53 m/s MV Vmean:     121.0 cm/s Wilbert Bihari MD Electronically signed by Wilbert Bihari MD Signature Date/Time: 05/08/2024/1:59:17 PM    Final    DG CHEST PORT 1 VIEW Result Date: 05/08/2024 CLINICAL DATA:  Hypoxia. EXAM: PORTABLE CHEST 1 VIEW COMPARISON:  05/07/2024. FINDINGS: Stable cardiomegaly. Unchanged right-sided Port-A-Cath tip overlies the superior cavoatrial junction. Stable left-sided defibrillator. Status post CABG and mitral valve repair. Median sternotomy. Bilateral central perihilar interstitial prominence with streaky bibasilar opacities is favored to reflect pulmonary vascular congestion with suspected interstitial edema. No pleural effusion or pneumothorax. No acute osseous abnormality. IMPRESSION: Cardiomegaly with pulmonary vascular congestion and suspected interstitial edema. Electronically Signed   By: Harrietta Sherry M.D.   On: 05/08/2024 13:20   DG Chest Port 1V same Day Result Date: 05/07/2024 CLINICAL DATA:  Hypotension. EXAM: PORTABLE CHEST 1 VIEW COMPARISON:  May 01, 2024. FINDINGS: Stable cardiomediastinal silhouette. Status post coronary artery bypass graft as well as mitral valve repair. Left-sided defibrillator is unchanged. Right sided Port-A-Cath is unchanged. Lungs are clear. Bony thorax is unremarkable. IMPRESSION: No active disease.  Electronically Signed   By: Lynwood Landy Raddle M.D.   On: 05/07/2024 17:19    Assessment: 78 y.o. male  Neutropenic fever and septic shock Suspected pneumonia Congestive heart failure CAD and A-fib Low rectal bleeding, likely secondary to hemorrhoids Tonsillar cancer, on concurrent chemoradiation On tube feeds  CKD stage IIIA DMS History of CVA Deconditioning    Plan:  - Labs reviewed.  He has moderate neutropenia, from chemotherapy. - Due to his septic shock upon agree with G-CSF, I have ordered Granix  480 mcg daily until ANC above 1.5K. - Radiation has been held after last treatment on Monday. - I will inform Dr. Autumn and Dr.  Izell  -will re-evaluate him on Sunday if he is still in house to see if he can restart radiation on Monday, to see if he needs to be transferred to [facility]. -we will f/u as needed    Onita Mattock, MD 05/08/2024

## 2024-05-08 NOTE — Progress Notes (Signed)
 Initial Nutrition Assessment  DOCUMENTATION CODES:   Severe malnutrition in context of acute illness/injury  INTERVENTION:  Once nausea and emesis better managed, recommend advancing continuous tube feeding via PEG tube: Osmolite 1.5 goal 29ml/hr ( per day) *Increase by 10ml q12h to goal rate 60ml ProSource TF20 BID Provides 2320 kcal, 130g protein, free water daily  Once tolerating at goal rate, transition to bolus tube feeding: Goal 7 cartons Osmolite 1.5 daily, or equivalent  Can provide 1.5 cartons 4 times per day with 1 additional bolus at night 30ml free water flush before and after each bolus ( total) Provide an additional free water flush of 80ml 4 times daily (320ml total) Provides 2485 kcal, 104g protein, free water daily  NUTRITION DIAGNOSIS:  Severe Malnutrition related to acute illness (tonsillar cancer) as evidenced by energy intake < or equal to 50% for > or equal to 5 days, severe muscle depletion.  GOAL:  Patient will meet greater than or equal to 90% of their needs  MONITOR:  Labs, Weight trends, TF tolerance, I & O's  REASON FOR ASSESSMENT:  Consult Enteral/tube feeding initiation and management  ASSESSMENT:  Pt presented with nausea, vomiting, diarrhea and rectal bleeding. 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   Pt became hypotensive yesterday, requiring pressor support and subsequently transferred to ICU.   GIB suspected to be r/t hemorrhoidal bleed. GI recommending conservative management. Per RN at bedside, no further blood bowel movements. Last BM was documented to be small, type 6.   Pt was diagnosed with tonsillar cancer about 2 months ago. Last chemo treatment on 7/31. Noted to have functional decline with fatigue and generalized weakness within the last week.    Spoke with pt's wife at bedside as pt sleeping at time of visit.  She states that initially he had continued to eat well. Within the last 10 days, pt's wife reports that he was only able to eat mashed potatoes and ice cream, 25% of an ensure to try to continue to use throat muscles.    PEG tube was placed on 7/9 and initially only used for water flushes. He saw [facility] who recommended 1 carton Osmolite 1.5 daily which was increased to 1.5 cartons. Pt began having progressive decline in ability to swallow and was reassessed by outpatient RD and recommended titration up to 7 cartons daily.   He was receiving anti-emetic with chemo and took one dose on Monday. He was previously tolerating 1 carton of tube feeding and titration up to 1.25 however did not tolerate 2 cartons.   He has been experiencing nausea and vomiting for the last several days though wife cannot recall when this began. RN reports he did have nausea and emesis this morning but no further emesis since receiving zofran . Stools are much improved.   Pt's wife reports that his usual weight on home scale is 189-192 lbs. Endorses gradual weight loss within the last 10 days. Review of chart reflects pt's weight has been fluctuating between 86-89 kg within the last 4 months. Between 7/17-8/7 his weight has declined 4.7% which is clinically significant for time frame.   Given patient on continuous tube feeding, will continue with continuous tube feeding titration to goal when nausea/emesis better managed and attempt to transition to bolus regimen that patient can tolerate.   Medications: diflucan  daily, SSI 0-9 units q4h,  imodium  daily Drips: Levo @ 13 mcg/min IV abx  Labs:  Sodium 132 BUN 43 Cr 1.81 Alkaline phosphatase 34 GFR 38 Lactic acid 2.2 CBG's 146-241 x24 hours  NUTRITION - FOCUSED PHYSICAL EXAM: Flowsheet Row Most Recent Value  Orbital Region Mild depletion  Upper Arm Region Severe depletion   Thoracic and Lumbar Region Moderate depletion  Buccal Region No depletion  Temple Region Mild depletion  Clavicle Bone Region Unable to assess  [pt positioning in bed]  Clavicle and Acromion Bone Region Moderate depletion  Scapular Bone Region Unable to assess  Dorsal Hand Unable to assess  Patellar Region Severe depletion  Anterior Thigh Region Severe depletion  Posterior Calf Region Severe depletion  Edema (RD Assessment) None  Hair Reviewed  Eyes Reviewed  Mouth Reviewed  Skin Reviewed  Nails Reviewed    Diet Order:   Diet Order             Diet NPO time specified  Diet effective now                   EDUCATION NEEDS:  Education needs have been addressed  Skin:  Skin Assessment: Skin Integrity Issues: Skin Integrity Issues:: Stage I Stage I: mid sacrum  Last BM:  8/6 type 6 small  Height:  Ht Readings from Last 1 Encounters:  05/06/24 6' (1.829 m)    Weight:  Wt Readings from Last 1 Encounters:  05/08/24 83.8 kg   BMI:  Body mass index is 25.06 kg/m.  Estimated Nutritional Needs:   Kcal:  2300-2500  Protein:  120-135g  Fluid:  >/=2L  [name], RDN, LDN Clinical Nutrition See [facility] for contact information.

## 2024-05-08 NOTE — Progress Notes (Signed)
 NAME:  Christian Bowman, MRN:  [id number], DOB:  [birth year], LOS: 0 ADMISSION DATE:  05/06/2024 CHIEF COMPLAINT:  NV diarrhea.    History of Present Illness:  Christian Bowman is a 78 y.o. 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  He presented to [facility] ED on 8/5 with rectal bleeding.   He was admitted by the hospitalist team and was evaluated by GI on 8/6.  Was felt that his rectal bleeding was most likely secondary to hemorrhoids for which medical management was recommended.   Over the course of the day, he had ongoing hypotension despite multiple fluid boluses.  Mental status had remained the same and he was essentially asymptomatic.  He had had 2 BMs prior in the day that was somewhat loose but not overt diarrhea and no true hematochezia.  He had had low-grade fever early in the morning but had resolved after 1 dose of Tylenol .  His Coreg  and Flomax  was stopped, he was given additional gentle IV fluids, albumin  as well as started on midodrine .  H&H remained stable (11/32).  Echocardiogram has been ordered and is pending.  Lactic acid 1.9, Pro-Cal 2.79.   Unfortunately later in the afternoon, he remained hypotensive (systolics in the 80s) and PCCM was subsequently called to see in consultation.  Interim History / Subjective:  Last chemo last Thursday.  Presented with nausea vomiting and diarrhea.  Wife thinks this is related to tube feeding.  He has not been able to tolerate increment amount of tube feeding volume.  Diarrhea now resolved.  However now coughing up phlegm with some blood-tinged sputum.  Now needing 6 L of oxygen  which is new.  Had a fever of 101.2.  CXR from yesterday reviewed by me.  No opacities but appears to have some and cephalization.  May be early pulm edema. Echo: EF less than 20%.  Objective    Blood pressure (!) 118/35, pulse 73, temperature 98.6 F (37 C), temperature source Oral, resp. rate 20, height 6' (1.829 m), weight 83.8 kg, SpO2 94%.        Intake/Output Summary (Last 24 hours) at 05/08/2024 0723 Last data filed at 05/08/2024 0700 Gross per 24 hour  Intake 2107.19 ml  Output 475 ml  Net 1632.19 ml   Filed Weights   05/06/24 1624 05/08/24 0500  Weight: 86 kg 83.8 kg    Examination: General: Weak with dry mucous membrane. Lungs: No rales.  Lungs otherwise clear. Cardiovascular: Regular rate and rhythm no murmurs appreciated. Abdomen: Nontender not distended.  PEG in place. Extremities: Bilateral pitting edema.  New. Neuro: Alert and oriented x 3.  Assessment and Plan  30 male with recently diagnosed tonsillar cancer on chemoradiation last chemotherapy last Thursday that is 7/31.  Currently neutropenic.  Also has history of CHF with EF less than 20% with CRT-D, A-fib on Eliquis  at home, CAD, bioprosthetic MVR, CKD, DM 2 and hypothyroidism.  POCUS done today: Some B-lines more on the left.  No consolidation seen on lung POCUS.  Cardiac POCUS showing severely reduced EF.  IVC around 2.5 cm with without much respiratory variation.  Chest x-ray: Worsening right middle  lung opacity could be due to pulmonary congestion versus pneumonia.  Respiratory/CVS/ID:  Suspected pneumonia: Neutropenic sepsis and shock: HFrEF, decompensated: Bioprosthetic MVR: CAD: Afib ?  Early pulm edema: -Hold GDMT except digoxin  and Eliquis  due to thrombocytopenia. -Hold other antihypertensive medications. -On Levophed  titrating up for MAP goal greater than 65.  Also on midodrine . -IVC dilated.  Euvolemic.  Avoid more fluids. -Started on Vanco and Zosyn .  Zyvox  discontinued with  neutropenia. -Cultures negative till date. -Get mixed venous blood gas. - Will start IV Lasix  when able with blood pressure.  Abdomen/Liver:  BRBPR: -Resolved.  GI consulted. -Likely secondary to hemorrhoids. -Topical Anusol .  Renal:  CKD: - Creatinine at baseline.  Avoid nephrotoxic drugs.  Urinary retention: - Foley continue.  Endocrine:  DM2: -On SSI.  Hypothyroidism: Continue home Synthroid .  Hematology:  Pancytopenia: -Hold DVT prophylaxis on Eliquis . - If neutropenia does not improve and sepsis does not improve will discuss with hematology regarding colony-stimulating factors.  Tonsillar cancer on chemoradiation status post PEG: - Tube feeding.  Oral thrush: P.o. Diflucan .  Best Practice (right click and Reselect all SmartList Selections daily)   Diet/type: tubefeeds DVT prophylaxis SCD Pressure ulcer(s): N/A GI prophylaxis: N/A Lines: yes and it is still needed Foley:  Yes, and it is still needed Code Status:  full code Last date of multidisciplinary goals of care discussion [wife at bedside and his regular being updated about the plan.]  Labs   CBC: Recent Labs  Lab 05/01/24 1026 05/06/24 1640 05/07/24 0541 05/07/24 1609 05/08/24 0425  WBC 1.6* 2.1* 1.7*  --  0.8*  NEUTROABS 1.2* 1.8 1.4*  --  0.7*  HGB 12.5* 13.0 13.0 11.0* 10.2*  HCT 36.8* 39.4 38.7* 32.3* 29.7*  MCV 90.9 93.6 91.9  --  90.3  PLT 73* 59* 56*  --  47*    Basic Metabolic Panel: Recent Labs  Lab 05/01/24 1026 05/06/24 1637 05/07/24 0541 05/07/24 1609 05/08/24 0425  NA 136 134* 135 133* 132*  K 4.8 5.4* 4.9 5.0 4.5  CL 103 95* 96* 99 98  CO2 30 28 29 25 25   GLUCOSE 237* 188* 106* 152* 266*  BUN 39* 58* 48* 47* 43*  CREATININE 1.30* 1.59* 1.48* 1.60* 1.81*  CALCIUM  8.3* 8.5* 9.0 8.3* 8.4*  MG  --   --  1.8  --   --   PHOS  --   --   --  2.5 3.4   GFR: Estimated Creatinine Clearance: 38.1 mL/min (A) (by C-G formula based on SCr of 1.81 mg/dL (H)). Recent Labs   Lab 05/01/24 1026 05/06/24 1640 05/07/24 0541 05/07/24 1609 05/08/24 0425  PROCALCITON  --   --   --  2.79 9.19  WBC 1.6* 2.1* 1.7*  --  0.8*  LATICACIDVEN  --   --   --  1.9 2.2*    Liver Function Tests: Recent Labs  Lab 05/01/24 1026 05/06/24 1637 05/07/24 0541 05/07/24 1609 05/08/24 0425  AST 9* 17 18 14* 15  ALT 14 29 26 18 17   ALKPHOS 44 52 47 36* 34*  BILITOT 1.0 1.5* 1.8* 2.3* 3.9*  PROT 6.0* 5.6* 5.9* 4.6* 5.4*  ALBUMIN  3.2* 2.9* 2.8* 2.1* 2.7*   No results for input(s): LIPASE, AMYLASE in the last 168 hours. No results for input(s): AMMONIA in the last 168 hours.  ABG    Component Value Date/Time   PHART 7.373 07/12/2016 1531   PCO2ART 40.1 07/12/2016 1531   PO2ART 67.0 (L) 07/12/2016 1531  HCO3 25.6 01/08/2020 1321   HCO3 25.7 01/08/2020 1321   TCO2 27 01/08/2020 1321   TCO2 27 01/08/2020 1321   ACIDBASEDEF 2.0 07/12/2016 1531   O2SAT 69.0 01/08/2020 1321   O2SAT 67.0 01/08/2020 1321      Past Medical History:  He,  has a past medical history of AICD (automatic cardioverter/defibrillator) present, Anginal pain (HCC), Anxiety, CAD (coronary artery disease), CHF (congestive heart failure) (HCC), Complication of anesthesia, Coronary artery disease involving coronary bypass graft, Cyst of neck, DM2 (diabetes mellitus, type 2) (HCC) (08/26/2013), Dyspnea, Heart attack (HCC), HTN (hypertension) (08/26/2013), Hyperlipidemia (08/26/2013), Hypothyroidism, Left main coronary artery disease, Left renal artery stenosis (HCC), Localized cancer of throat (HCC), Obesity (BMI 30.0-34.9) (08/26/2013), Postoperative atrial fibrillation (HCC) (10/15/2005), Presence of permanent cardiac pacemaker, S/P CABG x 4 (10/13/2005), S/P mitral valve replacement with bioprosthetic valve (07/11/2016), and S/P redo CABG x 2 (07/11/2016).   Surgical History:   Past Surgical History:  Procedure Laterality Date   A-FLUTTER ABLATION N/A 05/11/2017   Procedure: A-Flutter Ablation;   Surgeon: Inocencio Soyla Lunger, MD;  Location: [facility] INVASIVE CV LAB;  Service: Cardiovascular;  Laterality: N/A;   BIV ICD GENERATOR CHANGEOUT N/A 08/29/2023   Procedure: BIV ICD GENERATOR CHANGEOUT;  Surgeon: Inocencio Soyla Lunger, MD;  Location: [facility] INVASIVE CV LAB;  Service: Cardiovascular;  Laterality: N/A;   CARDIAC CATHETERIZATION N/A 06/21/2016   Procedure: Right/Left Heart Cath and Coronary/Graft Angiography;  Surgeon: Ozell Fell, MD;  Location: [facility] INVASIVE CV LAB;  Service: Cardiovascular;  Laterality: N/A;   CARDIAC VALVE REPLACEMENT     CARDIOVERSION N/A 07/19/2016   Procedure: CARDIOVERSION;  Surgeon: Redell GORMAN Shallow, MD;  Location: [facility] ENDOSCOPY;  Service: Cardiovascular;  Laterality: N/A;   CARDIOVERSION N/A 09/08/2016   Procedure: CARDIOVERSION;  Surgeon: Vina LULLA Gull, MD;  Location: [facility] ENDOSCOPY;  Service: Cardiovascular;  Laterality: N/A;   CORONARY ANGIOPLASTY     STENT 2016  [facility]   CORONARY ANGIOPLASTY WITH STENT PLACEMENT     DES in SVG to right coronary artery system   CORONARY ARTERY BYPASS GRAFT  10/13/2005   LIMA to LAD, SVG to intermediate branch, sequential SVG to PDA and RPL   CORONARY ARTERY BYPASS GRAFT N/A 07/11/2016   Procedure: REDO CORONARY ARTERY BYPASS GRAFTING (CABG) x two using left leg greater saphenous vein harvested endoscopically-SVG to PDA -SVG to RAMUS INTERMEDIATE;  Surgeon: Sudie VEAR Laine, MD;  Location: [facility] OR;  Service: Open Heart Surgery;  Laterality: N/A;   CORONARY ARTERY BYPASS GRAFT N/A 07/11/2016   Procedure: Re-exploration (CABG) for post op bleeding,;  Surgeon: Sudie VEAR Laine, MD;  Location: [facility] OR;  Service: Open Heart Surgery;  Laterality: N/A;   EP IMPLANTABLE DEVICE N/A 07/25/2016   Procedure: BiV ICD Insertion CRT-D;  Surgeon: Will Lunger Inocencio, MD;  Location: [facility] INVASIVE CV LAB;  Service: Cardiovascular;  Laterality: N/A;   INGUINAL HERNIA REPAIR Left 08/20/2017   INGUINAL HERNIA REPAIR Left 08/20/2017   Procedure: OPEN  LEFT INGUINAL HERNIA REPAIR;  Surgeon: Ethyl Lenis, MD;  Location: [facility] OR;  Service: General;  Laterality: Left;   IR GASTROSTOMY TUBE MOD SED  04/09/2024   IR IMAGING GUIDED PORT INSERTION  04/09/2024   LEFT HEART CATH AND CORS/GRAFTS ANGIOGRAPHY N/A 12/18/2016   Procedure: Left Heart Cath and Cors/Grafts Angiography;  Surgeon: Ozell Fell, MD;  Location: [facility] INVASIVE CV LAB;  Service: Cardiovascular;  Laterality: N/A;   LEFT HEART CATH AND CORS/GRAFTS ANGIOGRAPHY N/A 08/22/2017   Procedure: LEFT HEART CATH AND  CORS/GRAFTS ANGIOGRAPHY;  Surgeon: Rolan Ezra RAMAN, MD;  Location: [facility] INVASIVE CV LAB;  Service: Cardiovascular;  Laterality: N/A;   MITRAL VALVE REPLACEMENT N/A 07/11/2016   Procedure: MITRAL VALVE (MV) REPLACEMENT;  Surgeon: Sudie VEAR Laine, MD;  Location: [facility] OR;  Service: Open Heart Surgery;  Laterality: N/A;   MYOCARDICAL PERFUSION  10/09/2007   NORMAL PERFUSION IN ALL REGIONS;NO EVIDENCE OF INDUCIBLE ISCHEMIA;POST STRESS EF% 66   RENAL ARTERY STENT Right 2007   RENAL DOPPLER  03/28/2010   RIGHT RA-NORMAL;LEFT PROXIMAL RA AT STENT-PATENT WITH NO EVIDENCE OF SIGN DIAMETER REDUCTION. R & L KIDNEYS: EQUAL IN SIZE,SYMMETRICAL IN SHAPE.   RIGHT HEART CATH N/A 01/08/2020   Procedure: RIGHT HEART CATH;  Surgeon: Rolan Ezra RAMAN, MD;  Location: [facility] INVASIVE CV LAB;  Service: Cardiovascular;  Laterality: N/A;   TEE WITHOUT CARDIOVERSION N/A 06/15/2016   Procedure: TRANSESOPHAGEAL ECHOCARDIOGRAM (TEE);  Surgeon: Jerel Balding, MD;  Location: [facility] ENDOSCOPY;  Service: Cardiovascular;  Laterality: N/A;   TEE WITHOUT CARDIOVERSION N/A 07/11/2016   Procedure: TRANSESOPHAGEAL ECHOCARDIOGRAM (TEE);  Surgeon: Sudie VEAR Laine, MD;  Location: [facility] OR;  Service: Open Heart Surgery;  Laterality: N/A;   TEE WITHOUT CARDIOVERSION N/A 07/19/2016   Procedure: TRANSESOPHAGEAL ECHOCARDIOGRAM (TEE);  Surgeon: Redell RAMAN Shallow, MD;  Location: [facility] ENDOSCOPY;  Service: Cardiovascular;  Laterality: N/A;   TRANSESOPHAGEAL  ECHOCARDIOGRAM  10/19/2005   NORMAL LV; MILD TO MODERATE AMOUNT OF SOFT ATHEROMATOUS PLAQUE OF THE THORACIC AORTA; THE LEFT ATRIUM IS MILDLY DILATED;LEFT ATRIAL APPENDAGE FUNCTION IS NORMAL;NO THROMBUS IDENTIFIED. SMALL PFO WITH RIGHT TO LEFT SHUNT     Social History:   reports that he has never smoked. He has never used smokeless tobacco. He reports that he does not drink alcohol and does not use drugs.   Family History:  His family history includes Heart attack in his father and mother; Heart disease in his father, maternal grandmother, and mother; Hypertension in his father and sister.   Allergies Allergies  Allergen Reactions   Xanax [Alprazolam] Other (See Comments)    Pt feels very weak, tired and feels paralyzed       Home Medications  Prior to Admission medications   Medication Sig Start Date End Date Taking? Authorizing Provider  acetaminophen  (TYLENOL ) 325 MG tablet Take 325 mg by mouth every 6 (six) hours as needed for mild pain (pain score 1-3).   Yes [provider]  apixaban  (ELIQUIS ) 5 MG TABS tablet Take 1 tablet (5 mg total) by mouth 2 (two) times daily. 01/23/24  Yes Croitoru, Mihai, MD  carvedilol  (COREG ) 12.5 MG tablet TAKE 1 & 1/2 (ONE & ONE-HALF) TABLETS BY MOUTH TWICE DAILY WITH A MEAL 02/20/24  Yes Rolan Ezra RAMAN, MD  Cholecalciferol  (VITAMIN D ) 50 MCG (2000 UT) tablet Take 2,000 Units by mouth daily with breakfast.   Yes [provider]  dapagliflozin  propanediol (FARXIGA ) 10 MG TABS tablet Take 1 tablet (10 mg total) by mouth daily. 12/26/23  Yes Croitoru, Mihai, MD  dexamethasone  (DECADRON ) 4 MG tablet Take 2 tablets daily for 2 days, start the day after chemotherapy. Take with food. Patient taking differently: Take 8 mg by mouth daily as needed (Take only after chemotherapy per wife. Take with food.). 03/21/24  Yes Pasam, Avinash, MD  digoxin  (LANOXIN ) 0.125 MG tablet Take 1/2 (one-half) tablet by mouth once daily 12/26/23  Yes Croitoru, Mihai, MD   eplerenone  (INSPRA ) 50 MG tablet Take 1 tablet (50 mg total) by mouth daily. 06/11/23  Yes Rolan Ezra  S, MD  fluconazole  (DIFLUCAN ) 10 MG/ML suspension Take 20mL per tube today, then 10mL per tube daily for 20 more days. HOLD ROSUVASTATIN  WHILE ON THIS. Patient taking differently: Place 20 mLs into feeding tube daily. Take 20mL per tube today, then 10mL per tube daily for 20 more days. HOLD ROSUVASTATIN  WHILE ON THIS. 04/28/24  Yes Izell Domino, MD  furosemide  (LASIX ) 20 MG tablet Take 1 tablet (20 mg total) by mouth daily as needed (for a weight over 190 lbs). 12/26/23  Yes Croitoru, Mihai, MD  glipiZIDE  (GLUCOTROL ) 5 MG tablet Take 1 tablet (5 mg total) by mouth daily before breakfast. Only one time daily at breakfast. 04/30/24  Yes Nida, Ethelle ORN, MD  guaiFENesin  300 MG/15ML LIQD Place 600 mg into feeding tube 4 (four) times daily as needed. Patient taking differently: Place 600 mg into feeding tube 4 (four) times daily as needed (for congestion). 05/01/24  Yes Boscia, Heather E, NP  levothyroxine  (SYNTHROID ) 50 MCG tablet Take 1 tablet (50 mcg total) by mouth daily before breakfast. 03/19/24  Yes Nida, Ethelle ORN, MD  lidocaine  (XYLOCAINE ) 2 % solution Patient: Mix 1part 2% viscous lidocaine , 1part H20. Swish & swallow 10mL of diluted mixture, 30min before meals and at bedtime, up to QID 04/15/24  Yes Izell Domino, MD  lidocaine -prilocaine  (EMLA ) cream Apply to affected area once 03/21/24  Yes Pasam, Avinash, MD  neomycin-bacitracin-polymyxin (NEOSPORIN) ointment Apply 1 application  topically daily as needed for wound care.   Yes [provider]  nitroGLYCERIN  (NITROSTAT ) 0.4 MG SL tablet DISSOLVE 1 TABLET UNDER THE TONGUE EVERY 5 MINUTES AS NEEDED FOR CHEST PAIN, DO NOT EXCEED 3 DOSES IN 15 MINUTES 12/26/23  Yes Croitoru, Mihai, MD  Nutritional Supplements (FEEDING SUPPLEMENT, OSMOLITE 1.5 CAL,) LIQD Give 2 cartons at 8am, noon, 4pm bolus syringe and 1 carton at 8pm.  Flush with  60ml of water before and after each feeding.  Give additional 240ml TID between feedings for additional hydration. 04/28/24  Yes Izell Domino, MD  ondansetron  (ZOFRAN ) 8 MG tablet Take 1 tablet (8 mg total) by mouth every 8 (eight) hours as needed for nausea or vomiting. Start on the third day after chemotherapy. 03/21/24  Yes Pasam, Avinash, MD  oxyCODONE  (ROXICODONE ) 5 MG/5ML solution Place 5 mLs (5 mg total) into feeding tube every 6 (six) hours as needed for severe pain (pain score 7-10). 05/01/24  Yes Boscia, Heather E, NP  polyethylene glycol (MIRALAX ) 17 g packet Take 17 g by mouth daily as needed for mild constipation or moderate constipation.   Yes [provider]  prochlorperazine  (COMPAZINE ) 10 MG tablet Take 1 tablet (10 mg total) by mouth every 6 (six) hours as needed for nausea or vomiting. 03/21/24  Yes Pasam, Avinash, MD  rosuvastatin  (CRESTOR ) 10 MG tablet Take 1 tablet (10 mg total) by mouth daily. 06/11/23  Yes Rolan Ezra RAMAN, MD  sacubitril -valsartan  (ENTRESTO ) 97-103 MG Take 1 tablet by mouth twice daily 04/07/24  Yes McLean, Dalton S, MD  Tetrahydrozoline HCl (VISINE OP) Place 1 drop into both eyes daily as needed (irritation).   Yes [provider]  Blood Glucose Monitoring Suppl (ACCU-CHEK GUIDE ME) w/Device KIT 1 Piece by Does not apply route as directed. 02/01/22   Nida, Gebreselassie W, MD  glucose blood (ACCU-CHEK GUIDE TEST) test strip Use to test blood glucose daily as instructed 03/17/24   Nida, Gebreselassie W, MD  sodium zirconium cyclosilicate  (LOKELMA ) 10 g PACK packet DISSOLVE 1 PACKET IN WATER & DRINK ONCE  DAILY 09/24/23   Croitoru, Jerel, MD     CRITICAL CARE Performed by: Sammi JONETTA Fredericks.     Total critical care time: 45 minutes   Critical care time was exclusive of separately billable procedures and treating other patients.   Critical care was necessary to treat or prevent imminent or life-threatening deterioration.   Critical care was time spent  personally by me on the following activities: development of treatment plan with patient and/or surrogate as well as nursing, discussions with consultants, evaluation of patient's response to treatment, examination of patient, obtaining history from patient or surrogate, ordering and performing treatments and interventions, ordering and review of laboratory studies, ordering and review of radiographic studies, pulse oximetry, re-evaluation of patient's condition and participation in multidisciplinary rounds.  Sammi JONETTA Fredericks, MD Pulmonary, Critical Care and Sleep Attending.  Pager: [phone]  05/08/2024, 7:23 AM

## 2024-05-09 ENCOUNTER — Ambulatory Visit

## 2024-05-09 DIAGNOSIS — I4891 Unspecified atrial fibrillation: Secondary | ICD-10-CM | POA: Diagnosis not present

## 2024-05-09 DIAGNOSIS — D709 Neutropenia, unspecified: Secondary | ICD-10-CM | POA: Diagnosis not present

## 2024-05-09 DIAGNOSIS — R918 Other nonspecific abnormal finding of lung field: Secondary | ICD-10-CM | POA: Diagnosis not present

## 2024-05-09 DIAGNOSIS — K625 Hemorrhage of anus and rectum: Secondary | ICD-10-CM | POA: Diagnosis not present

## 2024-05-09 LAB — BASIC METABOLIC PANEL WITH GFR
Anion gap: 10 (ref 5–15)
BUN: 41 mg/dL — ABNORMAL HIGH (ref 8–23)
CO2: 28 mmol/L (ref 22–32)
Calcium: 8.7 mg/dL — ABNORMAL LOW (ref 8.9–10.3)
Chloride: 101 mmol/L (ref 98–111)
Creatinine, Ser: 2.37 mg/dL — ABNORMAL HIGH (ref 0.61–1.24)
GFR, Estimated: 28 mL/min — ABNORMAL LOW (ref >60)
Glucose, Bld: 193 mg/dL — ABNORMAL HIGH (ref 70–99)
Potassium: 4.4 mmol/L (ref 3.5–5.1)
Sodium: 139 mmol/L (ref 135–145)

## 2024-05-09 LAB — BRAIN NATRIURETIC PEPTIDE: B Natriuretic Peptide: 3731.2 pg/mL — ABNORMAL HIGH (ref 0.0–100.0)

## 2024-05-09 LAB — CBC WITH DIFFERENTIAL/PLATELET
Abs Granulocyte: 1.6 K/uL (ref 1.5–6.5)
Abs Immature Granulocytes: 0.14 K/uL — ABNORMAL HIGH (ref 0.00–0.07)
Basophils Absolute: 0 K/uL (ref 0.0–0.1)
Basophils Relative: 1 %
Eosinophils Absolute: 0 K/uL (ref 0.0–0.5)
Eosinophils Relative: 1 %
HCT: 30.6 % — ABNORMAL LOW (ref 39.0–52.0)
Hemoglobin: 10.4 g/dL — ABNORMAL LOW (ref 13.0–17.0)
Immature Granulocytes: 7 %
Lymphocytes Relative: 4 %
Lymphs Abs: 0.1 K/uL — ABNORMAL LOW (ref 0.7–4.0)
MCH: 30.9 pg (ref 26.0–34.0)
MCHC: 34 g/dL (ref 30.0–36.0)
MCV: 90.8 fL (ref 80.0–100.0)
Monocytes Absolute: 0.1 K/uL (ref 0.1–1.0)
Monocytes Relative: 6 %
Neutro Abs: 1.6 K/uL — ABNORMAL LOW (ref 1.7–7.7)
Neutrophils Relative %: 81 %
Platelets: 58 K/uL — ABNORMAL LOW (ref 150–400)
RBC: 3.37 MIL/uL — ABNORMAL LOW (ref 4.22–5.81)
RDW: 12.5 % (ref 11.5–15.5)
Smear Review: DECREASED
WBC Morphology: INCREASED
WBC: 2 K/uL — ABNORMAL LOW (ref 4.0–10.5)
nRBC: 0 % (ref 0.0–0.2)

## 2024-05-09 LAB — GLUCOSE, CAPILLARY
Glucose-Capillary: 138 mg/dL — ABNORMAL HIGH (ref 70–99)
Glucose-Capillary: 149 mg/dL — ABNORMAL HIGH (ref 70–99)
Glucose-Capillary: 166 mg/dL — ABNORMAL HIGH (ref 70–99)
Glucose-Capillary: 179 mg/dL — ABNORMAL HIGH (ref 70–99)
Glucose-Capillary: 180 mg/dL — ABNORMAL HIGH (ref 70–99)
Glucose-Capillary: 183 mg/dL — ABNORMAL HIGH (ref 70–99)
Glucose-Capillary: 203 mg/dL — ABNORMAL HIGH (ref 70–99)

## 2024-05-09 LAB — PHOSPHORUS: Phosphorus: 2.8 mg/dL (ref 2.5–4.6)

## 2024-05-09 LAB — MAGNESIUM: Magnesium: 2.5 mg/dL — ABNORMAL HIGH (ref 1.7–2.4)

## 2024-05-09 LAB — VANCOMYCIN, RANDOM: Vancomycin Rm: 14 ug/mL

## 2024-05-09 MED ORDER — NOREPINEPHRINE 16 MG/250ML-% IV SOLN
0.0000 ug/min | INTRAVENOUS | Status: DC
  Administered 2024-05-10: 26 ug/min via INTRAVENOUS
  Administered 2024-05-10 (×2): 25 ug/min via INTRAVENOUS
  Administered 2024-05-11: 35 ug/min via INTRAVENOUS
  Administered 2024-05-11: 19 ug/min via INTRAVENOUS
  Administered 2024-05-14 (×2): 3 ug/min via INTRAVENOUS
  Administered 2024-05-15: 2 ug/min via INTRAVENOUS
  Filled 2024-05-09 (×7): qty 250

## 2024-05-09 MED ORDER — OSMOLITE 1.5 CAL PO LIQD
1000.0000 mL | ORAL | Status: DC
  Administered 2024-05-10 – 2024-05-16 (×11): 1000 mL
  Filled 2024-05-09: qty 1000

## 2024-05-09 MED ORDER — VANCOMYCIN HCL 1250 MG/250ML IV SOLN
1250.0000 mg | Freq: Once | INTRAVENOUS | Status: AC
  Administered 2024-05-09: 1250 mg via INTRAVENOUS
  Filled 2024-05-09: qty 250

## 2024-05-09 MED ORDER — INSULIN ASPART 100 UNIT/ML IJ SOLN
0.0000 [IU] | INTRAMUSCULAR | Status: DC
  Administered 2024-05-09: 3 [IU] via SUBCUTANEOUS
  Administered 2024-05-09: 7 [IU] via SUBCUTANEOUS
  Administered 2024-05-09: 4 [IU] via SUBCUTANEOUS
  Administered 2024-05-10: 7 [IU] via SUBCUTANEOUS
  Administered 2024-05-10: 3 [IU] via SUBCUTANEOUS
  Administered 2024-05-10: 7 [IU] via SUBCUTANEOUS
  Administered 2024-05-10 – 2024-05-11 (×3): 4 [IU] via SUBCUTANEOUS
  Administered 2024-05-11: 7 [IU] via SUBCUTANEOUS
  Administered 2024-05-11: 4 [IU] via SUBCUTANEOUS
  Administered 2024-05-11 (×2): 3 [IU] via SUBCUTANEOUS
  Administered 2024-05-11: 4 [IU] via SUBCUTANEOUS
  Administered 2024-05-12 (×3): 7 [IU] via SUBCUTANEOUS
  Administered 2024-05-12: 4 [IU] via SUBCUTANEOUS
  Administered 2024-05-12 (×4): 7 [IU] via SUBCUTANEOUS
  Administered 2024-05-12: 4 [IU] via SUBCUTANEOUS
  Administered 2024-05-12: 7 [IU] via SUBCUTANEOUS
  Administered 2024-05-13 (×7): 4 [IU] via SUBCUTANEOUS
  Administered 2024-05-13: 7 [IU] via SUBCUTANEOUS
  Administered 2024-05-13: 4 [IU] via SUBCUTANEOUS
  Administered 2024-05-13: 7 [IU] via SUBCUTANEOUS
  Administered 2024-05-14 (×2): 11 [IU] via SUBCUTANEOUS

## 2024-05-09 MED ORDER — FUROSEMIDE 10 MG/ML IJ SOLN
40.0000 mg | Freq: Once | INTRAMUSCULAR | Status: AC
  Administered 2024-05-09: 40 mg via INTRAVENOUS
  Filled 2024-05-09: qty 4

## 2024-05-09 MED ORDER — PROSOURCE TF20 ENFIT COMPATIBL EN LIQD
60.0000 mL | Freq: Two times a day (BID) | ENTERAL | Status: DC
  Administered 2024-05-10 – 2024-05-19 (×25): 60 mL
  Filled 2024-05-09 (×19): qty 60

## 2024-05-09 NOTE — Progress Notes (Signed)
 Nutrition Brief Note  Full RD assessment documented yesterday 8/7.  Reviewed chart including medications and labs.  Levo drip remains stable at 13 mcg/min. No further documentation or report of emesis since yesterday morning.   Discussed nutritional status, malnutrition and further nutritional decline with CCM MD. Inquired about tube feeding advancement. MD would like to hold on advancement today though is amenable to further advancement over the weekend given above.  Tube feeding recommendations as below: Osmolite 1.5 goal 36ml/hr ( per day) *Increase by 10ml q12h to goal rate 60ml ProSource TF20 BID Provides 2320 kcal, 130g protein, free water daily  Will place titration orders to begin tomorrow.   Will continue to monitor tolerance to this nutrition regimen.  Please reach out via RD in-patient secure chat with nutrition related concerns/needs over the weekend.   Allie Davie Sagona, RDN, LDN Clinical Nutrition See AMiON for contact information.

## 2024-05-09 NOTE — Progress Notes (Signed)
 NAME:  Christian Bowman, MRN:  [id number], DOB:  1946/09/27, LOS: 1 ADMISSION DATE:  05/06/2024 CHIEF COMPLAINT:  NV diarrhea.    History of Present Illness:  Christian Bowman is a 78 y.o. 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  He presented to [facility] ED on 8/5 with rectal bleeding.   He was admitted by the hospitalist team and was evaluated by GI on 8/6.  Was felt that his rectal bleeding was most likely secondary to hemorrhoids for which medical management was recommended.   Over the course of the day, he had ongoing hypotension despite multiple fluid boluses.  Mental status had remained the same and he was essentially asymptomatic.  He had had 2 BMs prior in the day that was somewhat loose but not overt diarrhea and no true hematochezia.  He had had low-grade fever early in the morning but had resolved after 1 dose of Tylenol .  His Coreg  and Flomax  was stopped, he was given additional gentle IV fluids, albumin  as well as started on midodrine .  H&H remained stable (11/32).  Echocardiogram has been ordered and is pending.  Lactic acid 1.9, Pro-Cal 2.79.   Unfortunately later in the afternoon, he remained hypotensive (systolics in the 80s) and PCCM was subsequently called to see in consultation.  Interim History / Subjective:  Feeling much better today.  Off oxygen. Felt off Levophed .  Creatinine worsening.  Worsening BNP.  No diarrhea.  Objective    Blood pressure 120/64, pulse (!) 58, temperature 98.3 F (36.8 C), temperature source Oral, resp. rate (!) 22, height 6' (1.829 m), weight 83.8 kg, SpO2 94%.         Intake/Output Summary (Last 24 hours) at 05/09/2024 0944 Last data filed at 05/09/2024 0600 Gross per 24 hour  Intake 2153.71 ml  Output 1423 ml  Net 730.71 ml   Filed Weights   05/06/24 1624 05/08/24 0500  Weight: 86 kg 83.8 kg    Examination: General: Weak with dry mucous membrane. Lungs: No rales.  Lungs otherwise clear. Cardiovascular: Regular rate and rhythm no murmurs appreciated. Abdomen: Nontender not distended.  PEG in place. Extremities: Bilateral pitting edema.  New. Neuro: Alert and oriented x 3.  Assessment and Plan  31 male with recently diagnosed tonsillar cancer on chemoradiation last chemotherapy last Thursday that is 7/31.  Currently neutropenic.  Also has history of CHF with EF less than 20% with CRT-D, A-fib on Eliquis  at home, CAD, bioprosthetic MVR, CKD, DM 2 and hypothyroidism.  POCUS done: Some B-lines more on the left.  No consolidation seen on lung POCUS.  Cardiac POCUS showing severely reduced EF.  IVC around 2.5 cm with without much respiratory variation.  Chest x-ray: Worsening right middle lung opacity could be due to pulmonary congestion versus pneumonia.  Respiratory/CVS/ID:  Suspected pneumonia: Neutropenic sepsis and shock: HFrEF, decompensated: Bioprosthetic MVR: CAD: Afib: ?  Early pulm edema: -Hold GDMT and Eliquis  due to thrombocytopenia. Hold eliquis  w worsening creat.  -Hold other antihypertensive medications. -On Levophed  titrating up for SBP goal 100.  Also on midodrine . -Worsening BNP and creatinine.  Will start IV diuresis.  Lasix  40 -Started on  Vanco and Zosyn .  Zyvox  discontinued with neutropenia.  MRSA negative.  DC Vanco on 8/8. -Cultures negative till date.  Abdomen/Liver:  BRBPR: -Resolved.  GI consulted. -Likely secondary to hemorrhoids. -Topical Anusol .  Renal:  AKI on CKD: - Worsening creatinine.  Baseline around 1.6-1.8. - Mostly cardiogenic.  However sepsis induced/ATN could not be ruled out. - Diuresis as  above.  Urinary retention: - Foley continue.  Endocrine:  DM2: -On SSI.  Hypothyroidism: Continue home Synthroid .  Hematology:  Pancytopenia: -Hold DVT prophylaxis on Eliquis . - Heme consulted. - Filgrastim  given on 8/7.  Tonsillar cancer on chemoradiation status post PEG: - Tube feeding.  Oral thrush: P.o. Diflucan .  Hypomagnesemia: - Replaced.   Best Practice (right click and Reselect all SmartList Selections daily)   Diet/type: tubefeeds DVT prophylaxis SCD Pressure ulcer(s): N/A GI prophylaxis: PPI.  Lines: yes and it is still needed Foley:  Yes, and it is still needed Code Status:  full code Last date of multidisciplinary goals of care discussion [wife at bedside and regularly being updated about the plan.  All questions answered.  Labs   CBC: Recent Labs  Lab 05/06/24 1640 05/07/24 0541 05/07/24 1609 05/08/24 0425 05/08/24 1336 05/09/24 0401  WBC 2.1* 1.7*  --  0.8*  --  2.0*  NEUTROABS 1.8 1.4*  --  0.7*  --  1.6*  HGB 13.0 13.0 11.0* 10.2* 15.6 10.4*  HCT 39.4 38.7* 32.3* 29.7* 46.0 30.6*  MCV 93.6 91.9  --  90.3  --  90.8  PLT 59* 56*  --  47*  --  58*    Basic Metabolic Panel: Recent Labs  Lab 05/06/24 1637 05/07/24 0541 05/07/24 1609 05/08/24 0425 05/08/24 0434 05/08/24 1336 05/09/24 0401  NA 134* 135 133* 132*  --  133* 139  K 5.4* 4.9 5.0 4.5  --  4.1 4.4  CL 95* 96* 99 98  --   --  101  CO2 28 29 25 25   --   --  28  GLUCOSE 188* 106* 152* 266*  --   --  193*  BUN 58* 48* 47* 43*  --   --  41*  CREATININE 1.59* 1.48* 1.60* 1.81*  --   --  2.37*  CALCIUM  8.5* 9.0 8.3* 8.4*  --   --  8.7*  MG  --  1.8  --   --  1.6*  --  2.5*  PHOS  --   --  2.5 3.4  --   --  2.8   GFR: Estimated Creatinine Clearance: 29.1 mL/min (A) (by C-G formula based on SCr of 2.37 mg/dL (H)). Recent Labs  Lab 05/06/24 1640 05/07/24 0541 05/07/24 1609 05/08/24 0425 05/08/24 1825 05/09/24 0401  PROCALCITON  --   --  2.79 9.19  --   --   WBC 2.1*  1.7*  --  0.8*  --  2.0*  LATICACIDVEN  --   --  1.9 2.2* 1.9  --     Liver Function Tests: Recent Labs  Lab 05/06/24 1637 05/07/24 0541 05/07/24 1609 05/08/24 0425  AST 17 18 14* 15  ALT 29 26 18 17   ALKPHOS 52 47 36* 34*  BILITOT 1.5* 1.8* 2.3* 3.9*  PROT 5.6* 5.9* 4.6* 5.4*  ALBUMIN  2.9* 2.8* 2.1* 2.7*   No results for input(s): LIPASE, AMYLASE in the last 168 hours. No results for input(s): AMMONIA in the last 168 hours.  ABG    Component Value Date/Time   PHART 7.437 05/08/2024 1336  PCO2ART 36.6 05/08/2024 1336   PO2ART 92 05/08/2024 1336   HCO3 24.8 05/08/2024 1336   TCO2 26 05/08/2024 1336   ACIDBASEDEF 2.0 07/12/2016 1531   O2SAT 98 05/08/2024 1336      Past Medical History:  He,  has a past medical history of AICD (automatic cardioverter/defibrillator) present, Anginal pain (HCC), Anxiety, CAD (coronary artery disease), CHF (congestive heart failure) (HCC), Complication of anesthesia, Coronary artery disease involving coronary bypass graft, Cyst of neck, DM2 (diabetes mellitus, type 2) (HCC) (08/26/2013), Dyspnea, Heart attack (HCC), HTN (hypertension) (08/26/2013), Hyperlipidemia (08/26/2013), Hypothyroidism, Left main coronary artery disease, Left renal artery stenosis (HCC), Localized cancer of throat (HCC), Obesity (BMI 30.0-34.9) (08/26/2013), Postoperative atrial fibrillation (HCC) (10/15/2005), Presence of permanent cardiac pacemaker, S/P CABG x 4 (10/13/2005), S/P mitral valve replacement with bioprosthetic valve (07/11/2016), and S/P redo CABG x 2 (07/11/2016).   Surgical History:   Past Surgical History:  Procedure Laterality Date   A-FLUTTER ABLATION N/A 05/11/2017   Procedure: A-Flutter Ablation;  Surgeon: Inocencio Soyla Lunger, MD;  Location: [facility] INVASIVE CV LAB;  Service: Cardiovascular;  Laterality: N/A;   BIV ICD GENERATOR CHANGEOUT N/A 08/29/2023   Procedure: BIV ICD GENERATOR CHANGEOUT;  Surgeon: Inocencio Soyla Lunger, MD;  Location: [facility]  INVASIVE CV LAB;  Service: Cardiovascular;  Laterality: N/A;   CARDIAC CATHETERIZATION N/A 06/21/2016   Procedure: Right/Left Heart Cath and Coronary/Graft Angiography;  Surgeon: Ozell Fell, MD;  Location: [facility] INVASIVE CV LAB;  Service: Cardiovascular;  Laterality: N/A;   CARDIAC VALVE REPLACEMENT     CARDIOVERSION N/A 07/19/2016   Procedure: CARDIOVERSION;  Surgeon: Redell GORMAN Shallow, MD;  Location: [facility] ENDOSCOPY;  Service: Cardiovascular;  Laterality: N/A;   CARDIOVERSION N/A 09/08/2016   Procedure: CARDIOVERSION;  Surgeon: Vina LULLA Gull, MD;  Location: [facility] ENDOSCOPY;  Service: Cardiovascular;  Laterality: N/A;   CORONARY ANGIOPLASTY     STENT 2016  [facility]   CORONARY ANGIOPLASTY WITH STENT PLACEMENT     DES in SVG to right coronary artery system   CORONARY ARTERY BYPASS GRAFT  10/13/2005   LIMA to LAD, SVG to intermediate branch, sequential SVG to PDA and RPL   CORONARY ARTERY BYPASS GRAFT N/A 07/11/2016   Procedure: REDO CORONARY ARTERY BYPASS GRAFTING (CABG) x two using left leg greater saphenous vein harvested endoscopically-SVG to PDA -SVG to RAMUS INTERMEDIATE;  Surgeon: Sudie VEAR Laine, MD;  Location: [facility] OR;  Service: Open Heart Surgery;  Laterality: N/A;   CORONARY ARTERY BYPASS GRAFT N/A 07/11/2016   Procedure: Re-exploration (CABG) for post op bleeding,;  Surgeon: Sudie VEAR Laine, MD;  Location: [facility] OR;  Service: Open Heart Surgery;  Laterality: N/A;   EP IMPLANTABLE DEVICE N/A 07/25/2016   Procedure: BiV ICD Insertion CRT-D;  Surgeon: Will Lunger Inocencio, MD;  Location: [facility] INVASIVE CV LAB;  Service: Cardiovascular;  Laterality: N/A;   INGUINAL HERNIA REPAIR Left 08/20/2017   INGUINAL HERNIA REPAIR Left 08/20/2017   Procedure: OPEN LEFT INGUINAL HERNIA REPAIR;  Surgeon: Ethyl Lenis, MD;  Location: [facility] OR;  Service: General;  Laterality: Left;   IR GASTROSTOMY TUBE MOD SED  04/09/2024   IR IMAGING GUIDED PORT INSERTION  04/09/2024   LEFT HEART CATH AND CORS/GRAFTS ANGIOGRAPHY  N/A 12/18/2016   Procedure: Left Heart Cath and Cors/Grafts Angiography;  Surgeon: Ozell Fell, MD;  Location: [facility] INVASIVE CV LAB;  Service: Cardiovascular;  Laterality: N/A;   LEFT HEART CATH AND CORS/GRAFTS ANGIOGRAPHY N/A 08/22/2017   Procedure: LEFT HEART CATH AND CORS/GRAFTS ANGIOGRAPHY;  Surgeon: Rolan Barrack  S, MD;  Location: [facility] INVASIVE CV LAB;  Service: Cardiovascular;  Laterality: N/A;   MITRAL VALVE REPLACEMENT N/A 07/11/2016   Procedure: MITRAL VALVE (MV) REPLACEMENT;  Surgeon: Sudie VEAR Laine, MD;  Location: [facility] OR;  Service: Open Heart Surgery;  Laterality: N/A;   MYOCARDICAL PERFUSION  10/09/2007   NORMAL PERFUSION IN ALL REGIONS;NO EVIDENCE OF INDUCIBLE ISCHEMIA;POST STRESS EF% 66   RENAL ARTERY STENT Right 2007   RENAL DOPPLER  03/28/2010   RIGHT RA-NORMAL;LEFT PROXIMAL RA AT STENT-PATENT WITH NO EVIDENCE OF SIGN DIAMETER REDUCTION. R & L KIDNEYS: EQUAL IN SIZE,SYMMETRICAL IN SHAPE.   RIGHT HEART CATH N/A 01/08/2020   Procedure: RIGHT HEART CATH;  Surgeon: Rolan Ezra RAMAN, MD;  Location: [facility] INVASIVE CV LAB;  Service: Cardiovascular;  Laterality: N/A;   TEE WITHOUT CARDIOVERSION N/A 06/15/2016   Procedure: TRANSESOPHAGEAL ECHOCARDIOGRAM (TEE);  Surgeon: Jerel Balding, MD;  Location: [facility] ENDOSCOPY;  Service: Cardiovascular;  Laterality: N/A;   TEE WITHOUT CARDIOVERSION N/A 07/11/2016   Procedure: TRANSESOPHAGEAL ECHOCARDIOGRAM (TEE);  Surgeon: Sudie VEAR Laine, MD;  Location: [facility] OR;  Service: Open Heart Surgery;  Laterality: N/A;   TEE WITHOUT CARDIOVERSION N/A 07/19/2016   Procedure: TRANSESOPHAGEAL ECHOCARDIOGRAM (TEE);  Surgeon: Redell RAMAN Shallow, MD;  Location: [facility] ENDOSCOPY;  Service: Cardiovascular;  Laterality: N/A;   TRANSESOPHAGEAL ECHOCARDIOGRAM  10/19/2005   NORMAL LV; MILD TO MODERATE AMOUNT OF SOFT ATHEROMATOUS PLAQUE OF THE THORACIC AORTA; THE LEFT ATRIUM IS MILDLY DILATED;LEFT ATRIAL APPENDAGE FUNCTION IS NORMAL;NO THROMBUS IDENTIFIED. SMALL PFO WITH RIGHT TO LEFT SHUNT      Social History:   reports that he has never smoked. He has never used smokeless tobacco. He reports that he does not drink alcohol and does not use drugs.   Family History:  His family history includes Heart attack in his father and mother; Heart disease in his father, maternal grandmother, and mother; Hypertension in his father and sister.   Allergies Allergies  Allergen Reactions   Xanax [Alprazolam] Other (See Comments)    Pt feels very weak, tired and feels paralyzed       Home Medications  Prior to Admission medications   Medication Sig Start Date End Date Taking? Authorizing Provider  acetaminophen  (TYLENOL ) 325 MG tablet Take 325 mg by mouth every 6 (six) hours as needed for mild pain (pain score 1-3).   Yes [provider]  apixaban  (ELIQUIS ) 5 MG TABS tablet Take 1 tablet (5 mg total) by mouth 2 (two) times daily. 01/23/24  Yes Croitoru, Mihai, MD  carvedilol  (COREG ) 12.5 MG tablet TAKE 1 & 1/2 (ONE & ONE-HALF) TABLETS BY MOUTH TWICE DAILY WITH A MEAL 02/20/24  Yes Rolan Ezra RAMAN, MD  Cholecalciferol  (VITAMIN D ) 50 MCG (2000 UT) tablet Take 2,000 Units by mouth daily with breakfast.   Yes [provider]  dapagliflozin  propanediol (FARXIGA ) 10 MG TABS tablet Take 1 tablet (10 mg total) by mouth daily. 12/26/23  Yes Croitoru, Mihai, MD  dexamethasone  (DECADRON ) 4 MG tablet Take 2 tablets daily for 2 days, start the day after chemotherapy. Take with food. Patient taking differently: Take 8 mg by mouth daily as needed (Take only after chemotherapy per wife. Take with food.). 03/21/24  Yes Pasam, Avinash, MD  digoxin  (LANOXIN ) 0.125 MG tablet Take 1/2 (one-half) tablet by mouth once daily 12/26/23  Yes Croitoru, Mihai, MD  eplerenone  (INSPRA ) 50 MG tablet Take 1 tablet (50 mg total) by mouth daily. 06/11/23  Yes Rolan Ezra RAMAN, MD  fluconazole  (DIFLUCAN ) 10 MG/ML  suspension Take 20mL per tube today, then 10mL per tube daily for 20 more days. HOLD ROSUVASTATIN  WHILE ON  THIS. Patient taking differently: Place 20 mLs into feeding tube daily. Take 20mL per tube today, then 10mL per tube daily for 20 more days. HOLD ROSUVASTATIN  WHILE ON THIS. 04/28/24  Yes Izell Domino, MD  furosemide  (LASIX ) 20 MG tablet Take 1 tablet (20 mg total) by mouth daily as needed (for a weight over 190 lbs). 12/26/23  Yes Croitoru, Mihai, MD  glipiZIDE  (GLUCOTROL ) 5 MG tablet Take 1 tablet (5 mg total) by mouth daily before breakfast. Only one time daily at breakfast. 04/30/24  Yes Nida, Ethelle ORN, MD  guaiFENesin  300 MG/15ML LIQD Place 600 mg into feeding tube 4 (four) times daily as needed. Patient taking differently: Place 600 mg into feeding tube 4 (four) times daily as needed (for congestion). 05/01/24  Yes Boscia, Heather E, NP  levothyroxine  (SYNTHROID ) 50 MCG tablet Take 1 tablet (50 mcg total) by mouth daily before breakfast. 03/19/24  Yes Nida, Gebreselassie W, MD  lidocaine  (XYLOCAINE ) 2 % solution Patient: Mix 1part 2% viscous lidocaine , 1part H20. Swish & swallow 10mL of diluted mixture, 30min before meals and at bedtime, up to QID 04/15/24  Yes Izell Domino, MD  lidocaine -prilocaine  (EMLA ) cream Apply to affected area once 03/21/24  Yes Pasam, Avinash, MD  neomycin-bacitracin-polymyxin (NEOSPORIN) ointment Apply 1 application  topically daily as needed for wound care.   Yes [provider]  nitroGLYCERIN  (NITROSTAT ) 0.4 MG SL tablet DISSOLVE 1 TABLET UNDER THE TONGUE EVERY 5 MINUTES AS NEEDED FOR CHEST PAIN, DO NOT EXCEED 3 DOSES IN 15 MINUTES 12/26/23  Yes Croitoru, Mihai, MD  Nutritional Supplements (FEEDING SUPPLEMENT, OSMOLITE 1.5 CAL,) LIQD Give 2 cartons at 8am, noon, 4pm bolus syringe and 1 carton at 8pm.  Flush with 60ml of water before and after each feeding.  Give additional 240ml TID between feedings for additional hydration. 04/28/24  Yes Izell Domino, MD  ondansetron  (ZOFRAN ) 8 MG tablet Take 1 tablet (8 mg total) by mouth every 8 (eight) hours as needed for  nausea or vomiting. Start on the third day after chemotherapy. 03/21/24  Yes Pasam, Avinash, MD  oxyCODONE  (ROXICODONE ) 5 MG/5ML solution Place 5 mLs (5 mg total) into feeding tube every 6 (six) hours as needed for severe pain (pain score 7-10). 05/01/24  Yes Boscia, Heather E, NP  polyethylene glycol (MIRALAX ) 17 g packet Take 17 g by mouth daily as needed for mild constipation or moderate constipation.   Yes [provider]  prochlorperazine  (COMPAZINE ) 10 MG tablet Take 1 tablet (10 mg total) by mouth every 6 (six) hours as needed for nausea or vomiting. 03/21/24  Yes Pasam, Avinash, MD  rosuvastatin  (CRESTOR ) 10 MG tablet Take 1 tablet (10 mg total) by mouth daily. 06/11/23  Yes Rolan Ezra RAMAN, MD  sacubitril -valsartan  (ENTRESTO ) 97-103 MG Take 1 tablet by mouth twice daily 04/07/24  Yes McLean, Dalton S, MD  Tetrahydrozoline HCl (VISINE OP) Place 1 drop into both eyes daily as needed (irritation).   Yes [provider]  Blood Glucose Monitoring Suppl (ACCU-CHEK GUIDE ME) w/Device KIT 1 Piece by Does not apply route as directed. 02/01/22   Nida, Gebreselassie W, MD  glucose blood (ACCU-CHEK GUIDE TEST) test strip Use to test blood glucose daily as instructed 03/17/24   Nida, Gebreselassie W, MD  sodium zirconium cyclosilicate  (LOKELMA ) 10 g PACK packet DISSOLVE 1 PACKET IN WATER & DRINK ONCE DAILY 09/24/23   Croitoru, Almedia,  MD     CRITICAL CARE Performed by: Sammi JONETTA Fredericks.     Total critical care time: 50 minutes   Critical care time was exclusive of separately billable procedures and treating other patients.   Critical care was necessary to treat or prevent imminent or life-threatening deterioration.   Critical care was time spent personally by me on the following activities: development of treatment plan with patient and/or surrogate as well as nursing, discussions with consultants, evaluation of patient's response to treatment, examination of patient, obtaining history from  patient or surrogate, ordering and performing treatments and interventions, ordering and review of laboratory studies, ordering and review of radiographic studies, pulse oximetry, re-evaluation of patient's condition and participation in multidisciplinary rounds.  Sammi JONETTA Fredericks, MD Pulmonary, Critical Care and Sleep Attending.  Pager: [phone]  05/09/2024, 9:44 AM

## 2024-05-09 NOTE — Progress Notes (Signed)
 eLink Physician-Brief Progress Note Patient Name: Christian Bowman DOB: 1945-12-21 MRN: [id number]   Date of Service  05/09/2024  HPI/Events of Note  Recurrent NSVT  eICU Interventions  Add on bmp     Intervention Category Intermediate Interventions: Arrhythmia - evaluation and management  Jaisen Wiltrout 05/09/2024, 5:23 AM

## 2024-05-09 NOTE — TOC CM/SW Note (Addendum)
 Transition of Care [facility]) - Inpatient Brief Assessment   Patient Details  Name: Christian Bowman MRN: [id number] Date of Birth: 11-29-1945  Transition of Care [facility]) CM/SW Contact:    Tom-Johnson, Harvest Muskrat, RN Phone Number: 05/09/2024, 2:31 PM   Clinical Narrative:   Patient presented to the ED with N/V, Diarrhea, Rectal Bleeding. Patient has hx of Tonsillar Cancer and undergoing Chemotherapy and Radiation. Patient is on Eliquis  for A-Fib/Flutter, CAD s/p redo CABG s/p Bioprosthetic MVR, CVA, chronic HFrEF <20% w/ CHB s/p CRT-D, T2DM and CKD IIIa. Patient underwent Peg-tube placement on 04/09/24. Hgb today at 10.4. Chest x-ray showed worsening Rt Middle Lung Opacity could be due to Pulmonary Congestion v/s Pneumonia.  On room air. On IV abx, Anusol  Suppository. GI and Oncology following.   From home with wife, [name] who is at bedside. Does not have children, has a supportive sister. Retired, independent with care weeks prior to admission. Has a cane at home. Wife transports to and from appointments.  [name] states patient's current PCP will be retiring soon and she will schedule patient with her PCP's office. Uses [facility] in [place].   No TOC needs or recommendations noted at this time.  Awaiting PT/OT eval for disposition recommendation.  Patient not Medically ready for discharge.  CM will continue to follow as patient progresses with care towards discharge.          Transition of Care Asessment: Insurance and Status: Insurance coverage has been reviewed Patient has primary care physician: Yes Home environment has been reviewed: Yes Prior level of function:: Independent Prior/Current Home Services: No current home services Social Drivers of Health Review: SDOH reviewed no interventions necessary Readmission risk has been reviewed: Yes Transition of care needs: transition of care needs identified, TOC will continue to follow

## 2024-05-09 NOTE — Plan of Care (Signed)
   Problem: Coping: Goal: Ability to adjust to condition or change in health will improve Outcome: Progressing   Problem: Nutritional: Goal: Maintenance of adequate nutrition will improve Outcome: Progressing   Problem: Skin Integrity: Goal: Risk for impaired skin integrity will decrease Outcome: Progressing

## 2024-05-09 NOTE — Progress Notes (Signed)
 Heart Failure Navigator Progress Note  Assessed for Heart & Vascular TOC clinic readiness.  Patient does not meet criteria due to has a scheduled [facility] appointment on 05/23/2024 with Dr. Croituro. No HF TOC.   Navigator will sign off at this time.   Stephane Haddock, BSN, Scientist, clinical (histocompatibility and immunogenetics) Only

## 2024-05-10 ENCOUNTER — Inpatient Hospital Stay (HOSPITAL_COMMUNITY)

## 2024-05-10 DIAGNOSIS — E039 Hypothyroidism, unspecified: Secondary | ICD-10-CM

## 2024-05-10 DIAGNOSIS — I4891 Unspecified atrial fibrillation: Secondary | ICD-10-CM

## 2024-05-10 DIAGNOSIS — D709 Neutropenia, unspecified: Secondary | ICD-10-CM

## 2024-05-10 DIAGNOSIS — R579 Shock, unspecified: Secondary | ICD-10-CM

## 2024-05-10 DIAGNOSIS — K625 Hemorrhage of anus and rectum: Secondary | ICD-10-CM | POA: Diagnosis not present

## 2024-05-10 DIAGNOSIS — E1165 Type 2 diabetes mellitus with hyperglycemia: Secondary | ICD-10-CM

## 2024-05-10 DIAGNOSIS — D61818 Other pancytopenia: Secondary | ICD-10-CM

## 2024-05-10 LAB — COMPREHENSIVE METABOLIC PANEL WITH GFR
ALT: 29 U/L (ref 0–44)
ALT: 29 U/L (ref 0–44)
AST: 24 U/L (ref 15–41)
AST: 21 U/L (ref 15–41)
Albumin: 2.3 g/dL — ABNORMAL LOW (ref 3.5–5.0)
Albumin: 2 g/dL — ABNORMAL LOW (ref 3.5–5.0)
Alkaline Phosphatase: 47 U/L (ref 38–126)
Alkaline Phosphatase: 48 U/L (ref 38–126)
Anion gap: 11 (ref 5–15)
Anion gap: 12 (ref 5–15)
BUN: 47 mg/dL — ABNORMAL HIGH (ref 8–23)
BUN: 50 mg/dL — ABNORMAL HIGH (ref 8–23)
CO2: 25 mmol/L (ref 22–32)
CO2: 24 mmol/L (ref 22–32)
Calcium: 8.8 mg/dL — ABNORMAL LOW (ref 8.9–10.3)
Calcium: 8.5 mg/dL — ABNORMAL LOW (ref 8.9–10.3)
Chloride: 99 mmol/L (ref 98–111)
Chloride: 99 mmol/L (ref 98–111)
Creatinine, Ser: 2.4 mg/dL — ABNORMAL HIGH (ref 0.61–1.24)
Creatinine, Ser: 2.47 mg/dL — ABNORMAL HIGH (ref 0.61–1.24)
GFR, Estimated: 27 mL/min — ABNORMAL LOW (ref >60)
GFR, Estimated: 26 mL/min — ABNORMAL LOW (ref >60)
Glucose, Bld: 112 mg/dL — ABNORMAL HIGH (ref 70–99)
Glucose, Bld: 219 mg/dL — ABNORMAL HIGH (ref 70–99)
Potassium: 3.5 mmol/L (ref 3.5–5.1)
Potassium: 3.8 mmol/L (ref 3.5–5.1)
Sodium: 135 mmol/L (ref 135–145)
Sodium: 135 mmol/L (ref 135–145)
Total Bilirubin: 2.3 mg/dL — ABNORMAL HIGH (ref 0.0–1.2)
Total Bilirubin: 1.9 mg/dL — ABNORMAL HIGH (ref 0.0–1.2)
Total Protein: 6.1 g/dL — ABNORMAL LOW (ref 6.5–8.1)
Total Protein: 5.4 g/dL — ABNORMAL LOW (ref 6.5–8.1)

## 2024-05-10 LAB — PHOSPHORUS: Phosphorus: 2.6 mg/dL (ref 2.5–4.6)

## 2024-05-10 LAB — CBC
HCT: 36.7 % — ABNORMAL LOW (ref 39.0–52.0)
Hemoglobin: 12.4 g/dL — ABNORMAL LOW (ref 13.0–17.0)
MCH: 30.8 pg (ref 26.0–34.0)
MCHC: 33.8 g/dL (ref 30.0–36.0)
MCV: 91.1 fL (ref 80.0–100.0)
Platelets: 73 K/uL — ABNORMAL LOW (ref 150–400)
RBC: 4.03 MIL/uL — ABNORMAL LOW (ref 4.22–5.81)
RDW: 12.5 % (ref 11.5–15.5)
WBC: 6.1 K/uL (ref 4.0–10.5)
nRBC: 0 % (ref 0.0–0.2)

## 2024-05-10 LAB — GLUCOSE, CAPILLARY
Glucose-Capillary: 102 mg/dL — ABNORMAL HIGH (ref 70–99)
Glucose-Capillary: 165 mg/dL — ABNORMAL HIGH (ref 70–99)
Glucose-Capillary: 215 mg/dL — ABNORMAL HIGH (ref 70–99)
Glucose-Capillary: 209 mg/dL — ABNORMAL HIGH (ref 70–99)
Glucose-Capillary: 160 mg/dL — ABNORMAL HIGH (ref 70–99)
Glucose-Capillary: 100 mg/dL — ABNORMAL HIGH (ref 70–99)

## 2024-05-10 LAB — COOXEMETRY PANEL
Carboxyhemoglobin: 1.6 % — ABNORMAL HIGH (ref 0.5–1.5)
Methemoglobin: <0.7 % (ref 0.0–1.5)
O2 Saturation: 75.9 %
Total hemoglobin: 12.8 g/dL (ref 12.0–16.0)

## 2024-05-10 LAB — LACTIC ACID, PLASMA
Lactic Acid, Venous: 1.4 mmol/L (ref 0.5–1.9)
Lactic Acid, Venous: 1.4 mmol/L (ref 0.5–1.9)

## 2024-05-10 LAB — MAGNESIUM: Magnesium: 2.2 mg/dL (ref 1.7–2.4)

## 2024-05-10 LAB — TSH: TSH: 1.324 u[IU]/mL (ref 0.350–4.500)

## 2024-05-10 MED ORDER — FUROSEMIDE 10 MG/ML IJ SOLN
40.0000 mg | Freq: Once | INTRAMUSCULAR | Status: AC
  Administered 2024-05-10: 40 mg via INTRAVENOUS
  Filled 2024-05-10: qty 4

## 2024-05-10 MED ORDER — INSULIN ASPART 100 UNIT/ML IJ SOLN
4.0000 [IU] | INTRAMUSCULAR | Status: DC
  Administered 2024-05-10 – 2024-05-11 (×3): 4 [IU] via SUBCUTANEOUS

## 2024-05-10 MED ORDER — DOBUTAMINE-DEXTROSE 4-5 MG/ML-% IV SOLN
2.5000 ug/kg/min | INTRAVENOUS | Status: DC
  Administered 2024-05-10: 2.5 ug/kg/min via INTRAVENOUS
  Filled 2024-05-10: qty 250

## 2024-05-10 MED ORDER — ZOLPIDEM TARTRATE 5 MG PO TABS
5.0000 mg | ORAL_TABLET | Freq: Every evening | ORAL | Status: DC | PRN
  Administered 2024-05-10: 5 mg

## 2024-05-10 MED ORDER — EPINEPHRINE HCL 5 MG/250ML IV SOLN IN NS
5.0000 ug/min | INTRAVENOUS | Status: DC
  Administered 2024-05-10: 2 ug/min via INTRAVENOUS
  Filled 2024-05-10: qty 250

## 2024-05-10 MED ORDER — PNEUMOCOCCAL 20-VAL CONJ VACC 0.5 ML IM SUSY
0.5000 mL | PREFILLED_SYRINGE | INTRAMUSCULAR | Status: AC
  Administered 2024-05-11: 0.5 mL via INTRAMUSCULAR
  Filled 2024-05-10: qty 0.5

## 2024-05-10 MED ORDER — ZOLPIDEM TARTRATE 5 MG PO TABS
5.0000 mg | ORAL_TABLET | Freq: Every evening | ORAL | Status: DC | PRN
  Filled 2024-05-10: qty 1

## 2024-05-10 MED ORDER — POTASSIUM CHLORIDE 20 MEQ PO PACK
60.0000 meq | PACK | Freq: Once | ORAL | Status: AC
  Administered 2024-05-10: 60 meq
  Filled 2024-05-10: qty 3

## 2024-05-10 NOTE — Progress Notes (Addendum)
 NAME:  DIOGENES WHIRLEY, MRN:  [id number], DOB:  07/29/1946, LOS: 2 ADMISSION DATE:  05/06/2024 CHIEF COMPLAINT:  NV diarrhea.    History of Present Illness:  JOHNELL BAS is a 78 y.o. 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  He presented to [facility] ED on 8/5 with rectal bleeding.   He was admitted by the hospitalist team and was evaluated by GI on 8/6.  Was felt that his rectal bleeding was most likely secondary to hemorrhoids for which medical management was recommended.   Over the course of the day, he had ongoing hypotension despite multiple fluid boluses.  Mental status had remained the same and he was essentially asymptomatic.  He had had 2 BMs prior in the day that was somewhat loose but not overt diarrhea and no true hematochezia.  He had had low-grade fever early in the morning but had resolved after 1 dose of Tylenol .  His Coreg  and Flomax  was stopped, he was given additional gentle IV fluids, albumin  as well as started on midodrine .  H&H remained stable (11/32).  Echocardiogram has been ordered and is pending.  Lactic acid 1.9, Pro-Cal 2.79.   Unfortunately later in the afternoon, he remained hypotensive (systolics in the 80s) and PCCM was subsequently called to see in consultation. Hospital course   8/6 admitted, seen by GI 8/7 pccm asked to see for shock 8/8: Chest x-ray: Worsening right middle lung opacity could be due to pulmonary congestion versus pneumonia.POCUS done: Some B-lines more on the left.  No consolidation seen on lung POCUS.  Cardiac POCUS showing severely reduced EF.  IVC around 2.5 cm with without  much respiratory variation. Interim History / Subjective:   No distress this morning.  Denies pain.  Had a bowel movement this morning Objective    Blood pressure (!) 90/55, pulse 99, temperature 97.6 F (36.4 C), temperature source Oral, resp. rate (!) 26, height 6' (1.829 m), weight 83.8 kg, SpO2 96%.        Intake/Output Summary (Last 24 hours) at 05/10/2024 0746 Last data filed at 05/10/2024 9288 Gross per 24 hour  Intake 2193.43 ml  Output 2200 ml  Net -6.57 ml   Filed Weights   05/06/24 1624 05/08/24 0500  Weight: 86 kg 83.8 kg   General Pleasant 78 year old white male lying in bed no acute distress HEENT normocephalic atraumatic no clear jugular venous distention mucous membranes moist, he has chronic right neck mass previously identified as not a malignancy.  Phonation quality is hoarse Pulmonary faint crackles in the bases currently 2 L/min no accessory use Cardiac: Regular irregular atrial fibrillation on monitor Abdomen soft nontender Extremities warm dry brisk cap refill minimal edema Neuro awake oriented no focal deficits. GU clear yellow Examination: Resolved problem list   Septic shock   Assessment and Plan  16 male with recently diagnosed tonsillar cancer on chemoradiation last chemotherapy last Thursday that is 7/31.  Currently neutropenic.  Also has history of CHF with EF less than 20% with CRT-D, A-fib on Eliquis  at home, CAD, bioprosthetic MVR, CKD, DM 2 and hypothyroidism.  BRBPR->felt 2/2 hemorrhoids and prob complicated by thrombocytopenia and DOAC  This seems to have have resolved  plan Topical Anusol  Stool softeners and fiber   Shock. Mixed picture of neutropenic sepsis (NOS) and cardiogenic shock/ w/ Acute systolic heart failure (EF <20% w/ global hypokinesis) MRSA neg. Zyvox  stopped Still on high dose pressors, Co. ox at goal lactate normal Plan Keep euvolemic, will give another dose of Lasix  today Ck lactic acid Trend BNP MAP goal > 65; cont  norepi and midodrine  His Co. ox is at goal, will add low-dose epinephrine  for inotropic support and see if we cannot assist him in getting on a lower dose norepinephrine  Day 4 zosyn  Not candidate for GDMT  Afib Plan Rate control Tele Cont to hold [facility] for now given bleeding risk   AKI on CKD: -Baseline around 1.6-1.8. scr rising still but may be at plateau; suspect exacerbated by shock state  plan Ensure euvolemia Renal dose meds Strict I&O Keep foley given worsening renal fx   Urinary retention: Plan Foley continue.  Pancytopenia: - Heme consulted. - Filgrastim  given on 8/7. Wbc rising. Hgb stable. Think some of bump reflect diuresis PLTs improved Plan Monitor  DM2: Plan Ssi  Goal 140-180  Hypothyroidism: Plan Continue home Synthroid .  Tonsillar cancer on chemoradiation status post PEG: Plan Cont  Tube feeding. F/u OPS  Oral thrush plan P.o. Diflucan .    Best Practice (right click and Reselect all SmartList Selections daily)   Diet/type: tubefeeds DVT prophylaxis SCD Pressure ulcer(s): N/A GI prophylaxis: PPI.  Lines: yes and it is still needed Foley:  Yes, and it is still needed Code Status:  full code Last date of multidisciplinary goals of care discussion [wife at bedside and regularly being updated about the plan.  All questions answered.  CRITICAL CARE  45 minutes 05/10/2024, 7:46 AM

## 2024-05-10 NOTE — Progress Notes (Signed)
 Christian Bowman has had a little bit of a tough time this morning.  He does not wish to have any further therapy for the tonsillar cancer.  His wife was with him.  I told him that a lot is going on with him right now.  I totally understand what he is saying.  I think we just need to allow to get over this little episode that he is having and then see how things look.  Clearly, he has a bad heart.  His BNP was 3700.  Thankfully, his white cell counts come back nicely.  His white cell count 6.1.  Hemoglobin 12.4.  Platelet count 73,000.  His electrolytes show sodium 135.  Potassium 3.5.  BUN 47 creatinine 2.4.  Calcium  8.8 with an albumin  of 2.3.  I think all cultures have been negative so far.  He is about halfway through his radiation therapy.  He just gets weekly low-dose chemotherapy.  His prognosis for the tonsillar cancer should be incredibly good.  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  It is these types of head neck cancers that respond well to chemotherapy and radiation therapy.  He is getting tube feeds.  The tube feeds we will be increased a little bit today.  He does have a PEG tube in.  However, the tube feeds are going through a banded tube.  His vital signs show temperature 97.6.  Pulse 102.  Blood pressure 124/65.  His head neck exam does show the radiation changes in the throat.  There is no obvious adenopathy in the neck.  His lungs are relatively clear bilaterally.  Cardiac exam is tachycardic and regular.  He does have an occasional extra beat.  Abdomen is soft.  He has a PEG tube.  He has no fluid wave.  There is no palpable liver or spleen tip.  Extremities shows no clubbing, cyanosis or edema.   For right now, Christian Bowman is improving with respect to this neutropenia that he had.  His blood counts are certainly back to the relatively normal levels.  I suspect he will still remain in the ICU for right now.  I talked to his wife.  I would totally respect his wishes.  However, I think this is a  tumor that has a very high chance of being cured.  I know that the treatment is incredibly aggressive.  I also know that the staff in the ICU are doing a fantastic job with him.     Jeralyn Crease, MD  Psalm 46:10

## 2024-05-10 NOTE — Progress Notes (Signed)
 eLink Physician-Brief Progress Note Patient Name: Christian Bowman DOB: November 29, 1945 MRN: [id number]   Date of Service  05/10/2024  HPI/Events of Note  K 3.5, Cr 2.4  eICU Interventions  Kcl     Intervention Category Minor Interventions: Electrolytes abnormality - evaluation and management  Liridona Mashaw 05/10/2024, 5:45 AM

## 2024-05-10 NOTE — Progress Notes (Signed)
 Scr slowly rising Hemodynamics holding.  Slowly coming down on NE w/ addition of epi. But renal fxn a little worse.  Will add dobutamine  Dc epi Cont Norepi Think he prob needs a little reduction in his vascular resistance to improve his CO now that we know we can maintain his MAP w/ norepi and we haven't really seen Improvement epi.

## 2024-05-10 NOTE — Plan of Care (Signed)

## 2024-05-10 NOTE — Plan of Care (Signed)
  Problem: Coping: Goal: Ability to adjust to condition or change in health will improve Outcome: Progressing   Problem: Metabolic: Goal: Ability to maintain appropriate glucose levels will improve Outcome: Progressing   Problem: Nutritional: Goal: Maintenance of adequate nutrition will improve Outcome: Progressing   Problem: Education: Goal: Knowledge of General Education information will improve Description: Including pain rating scale, medication(s)/side effects and non-pharmacologic comfort measures Outcome: Progressing   Problem: Clinical Measurements: Goal: Ability to maintain clinical measurements within normal limits will improve Outcome: Progressing

## 2024-05-11 DIAGNOSIS — C090 Malignant neoplasm of tonsillar fossa: Secondary | ICD-10-CM | POA: Diagnosis not present

## 2024-05-11 DIAGNOSIS — I5023 Acute on chronic systolic (congestive) heart failure: Secondary | ICD-10-CM | POA: Diagnosis not present

## 2024-05-11 DIAGNOSIS — D709 Neutropenia, unspecified: Secondary | ICD-10-CM | POA: Diagnosis not present

## 2024-05-11 DIAGNOSIS — R579 Shock, unspecified: Secondary | ICD-10-CM | POA: Diagnosis not present

## 2024-05-11 DIAGNOSIS — K625 Hemorrhage of anus and rectum: Secondary | ICD-10-CM | POA: Diagnosis not present

## 2024-05-11 DIAGNOSIS — I4891 Unspecified atrial fibrillation: Secondary | ICD-10-CM | POA: Diagnosis not present

## 2024-05-11 LAB — COMPREHENSIVE METABOLIC PANEL WITH GFR
ALT: 36 U/L (ref 0–44)
ALT: 33 U/L (ref 0–44)
AST: 29 U/L (ref 15–41)
AST: 25 U/L (ref 15–41)
Albumin: 2.1 g/dL — ABNORMAL LOW (ref 3.5–5.0)
Albumin: 1.8 g/dL — ABNORMAL LOW (ref 3.5–5.0)
Alkaline Phosphatase: 65 U/L (ref 38–126)
Alkaline Phosphatase: 59 U/L (ref 38–126)
Anion gap: 12 (ref 5–15)
Anion gap: 12 (ref 5–15)
BUN: 52 mg/dL — ABNORMAL HIGH (ref 8–23)
BUN: 48 mg/dL — ABNORMAL HIGH (ref 8–23)
CO2: 25 mmol/L (ref 22–32)
CO2: 23 mmol/L (ref 22–32)
Calcium: 8.9 mg/dL (ref 8.9–10.3)
Calcium: 7.9 mg/dL — ABNORMAL LOW (ref 8.9–10.3)
Chloride: 101 mmol/L (ref 98–111)
Chloride: 100 mmol/L (ref 98–111)
Creatinine, Ser: 2.48 mg/dL — ABNORMAL HIGH (ref 0.61–1.24)
Creatinine, Ser: 2.01 mg/dL — ABNORMAL HIGH (ref 0.61–1.24)
GFR, Estimated: 26 mL/min — ABNORMAL LOW (ref >60)
GFR, Estimated: 34 mL/min — ABNORMAL LOW (ref >60)
Glucose, Bld: 176 mg/dL — ABNORMAL HIGH (ref 70–99)
Glucose, Bld: 375 mg/dL — ABNORMAL HIGH (ref 70–99)
Potassium: 3.7 mmol/L (ref 3.5–5.1)
Potassium: 3.2 mmol/L — ABNORMAL LOW (ref 3.5–5.1)
Sodium: 138 mmol/L (ref 135–145)
Sodium: 135 mmol/L (ref 135–145)
Total Bilirubin: 2 mg/dL — ABNORMAL HIGH (ref 0.0–1.2)
Total Bilirubin: 1.4 mg/dL — ABNORMAL HIGH (ref 0.0–1.2)
Total Protein: 5.8 g/dL — ABNORMAL LOW (ref 6.5–8.1)
Total Protein: 4.8 g/dL — ABNORMAL LOW (ref 6.5–8.1)

## 2024-05-11 LAB — CBC
HCT: 35.5 % — ABNORMAL LOW (ref 39.0–52.0)
Hemoglobin: 12 g/dL — ABNORMAL LOW (ref 13.0–17.0)
MCH: 30.5 pg (ref 26.0–34.0)
MCHC: 33.8 g/dL (ref 30.0–36.0)
MCV: 90.3 fL (ref 80.0–100.0)
Platelets: 74 K/uL — ABNORMAL LOW (ref 150–400)
RBC: 3.93 MIL/uL — ABNORMAL LOW (ref 4.22–5.81)
RDW: 12.7 % (ref 11.5–15.5)
WBC: 12.9 K/uL — ABNORMAL HIGH (ref 4.0–10.5)
nRBC: 0 % (ref 0.0–0.2)

## 2024-05-11 LAB — GLUCOSE, CAPILLARY
Glucose-Capillary: 226 mg/dL — ABNORMAL HIGH (ref 70–99)
Glucose-Capillary: 189 mg/dL — ABNORMAL HIGH (ref 70–99)
Glucose-Capillary: 203 mg/dL — ABNORMAL HIGH (ref 70–99)
Glucose-Capillary: 150 mg/dL — ABNORMAL HIGH (ref 70–99)
Glucose-Capillary: 129 mg/dL — ABNORMAL HIGH (ref 70–99)
Glucose-Capillary: 171 mg/dL — ABNORMAL HIGH (ref 70–99)
Glucose-Capillary: 184 mg/dL — ABNORMAL HIGH (ref 70–99)

## 2024-05-11 LAB — COOXEMETRY PANEL
Carboxyhemoglobin: 1.4 % (ref 0.5–1.5)
Methemoglobin: <0.7 % (ref 0.0–1.5)
O2 Saturation: 63.1 %
Total hemoglobin: 12 g/dL (ref 12.0–16.0)

## 2024-05-11 LAB — LACTIC ACID, PLASMA: Lactic Acid, Venous: 1.4 mmol/L (ref 0.5–1.9)

## 2024-05-11 LAB — BRAIN NATRIURETIC PEPTIDE: B Natriuretic Peptide: 4153.6 pg/mL — ABNORMAL HIGH (ref 0.0–100.0)

## 2024-05-11 MED ORDER — MIDODRINE HCL 5 MG PO TABS
15.0000 mg | ORAL_TABLET | Freq: Three times a day (TID) | ORAL | Status: DC
  Administered 2024-05-11: 15 mg
  Filled 2024-05-11: qty 3

## 2024-05-11 MED ORDER — INSULIN GLARGINE-YFGN 100 UNIT/ML ~~LOC~~ SOLN
10.0000 [IU] | Freq: Every day | SUBCUTANEOUS | Status: DC
  Administered 2024-05-11: 10 [IU] via SUBCUTANEOUS
  Filled 2024-05-11 (×2): qty 0.1

## 2024-05-11 MED ORDER — POTASSIUM CHLORIDE 20 MEQ PO PACK
40.0000 meq | PACK | Freq: Once | ORAL | Status: AC
  Administered 2024-05-11: 40 meq
  Filled 2024-05-11: qty 2

## 2024-05-11 MED ORDER — VASOPRESSIN 20 UNITS/100 ML INFUSION FOR SHOCK
0.0400 [IU]/min | INTRAVENOUS | Status: DC
  Administered 2024-05-11 – 2024-05-12 (×8): 0.04 [IU]/min via INTRAVENOUS
  Filled 2024-05-11 (×5): qty 100

## 2024-05-11 MED ORDER — INSULIN ASPART 100 UNIT/ML IJ SOLN
6.0000 [IU] | INTRAMUSCULAR | Status: DC
  Administered 2024-05-11 – 2024-05-14 (×29): 6 [IU] via SUBCUTANEOUS

## 2024-05-11 NOTE — Plan of Care (Signed)
  Problem: Coping: Goal: Ability to adjust to condition or change in health will improve Outcome: Progressing   Problem: Fluid Volume: Goal: Ability to maintain a balanced intake and output will improve Outcome: Progressing   Problem: Metabolic: Goal: Ability to maintain appropriate glucose levels will improve Outcome: Progressing   Problem: Nutritional: Goal: Maintenance of adequate nutrition will improve Outcome: Progressing Goal: Progress toward achieving an optimal weight will improve Outcome: Progressing   Problem: Skin Integrity: Goal: Risk for impaired skin integrity will decrease Outcome: Progressing   Problem: Tissue Perfusion: Goal: Adequacy of tissue perfusion will improve Outcome: Progressing   Problem: Clinical Measurements: Goal: Will remain free from infection Outcome: Progressing Goal: Diagnostic test results will improve Outcome: Progressing Goal: Respiratory complications will improve Outcome: Progressing Goal: Cardiovascular complication will be avoided Outcome: Progressing   Problem: Nutrition: Goal: Adequate nutrition will be maintained Outcome: Progressing   Problem: Coping: Goal: Level of anxiety will decrease Outcome: Progressing   Problem: Elimination: Goal: Will not experience complications related to bowel motility Outcome: Progressing Goal: Will not experience complications related to urinary retention Outcome: Progressing   Problem: Pain Managment: Goal: General experience of comfort will improve and/or be controlled Outcome: Progressing   Problem: Safety: Goal: Ability to remain free from injury will improve Outcome: Progressing   Problem: Skin Integrity: Goal: Risk for impaired skin integrity will decrease Outcome: Progressing

## 2024-05-11 NOTE — Progress Notes (Signed)
 NAME:  Christian Bowman, MRN:  [id number], DOB:  [birth year], LOS: 3 ADMISSION DATE:  05/06/2024 CHIEF COMPLAINT:  NV diarrhea.    History of Present Illness:  Christian Bowman is a 78 y.o. 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  He presented to [facility] ED on 8/5 with rectal bleeding.   He was admitted by the hospitalist team and was evaluated by GI on 8/6.  Was felt that his rectal bleeding was most likely secondary to hemorrhoids for which medical management was recommended.   Over the course of the day, he had ongoing hypotension despite multiple fluid boluses.  Mental status had remained the same and he was essentially asymptomatic.  He had had 2 BMs prior in the day that was somewhat loose but not overt diarrhea and no true hematochezia.  He had had low-grade fever early in the morning but had resolved after 1 dose of Tylenol .  His Coreg  and Flomax  was stopped, he was given additional gentle IV fluids, albumin  as well as started on midodrine .  H&H remained stable (11/32).  Echocardiogram has been ordered and is pending.  Lactic acid 1.9, Pro-Cal 2.79.   Unfortunately later in the afternoon, he remained hypotensive (systolics in the 80s) and PCCM was subsequently called to see in consultation. Hospital course   8/6 admitted, seen by GI 8/7 pccm asked to see for shock 8/8: Chest x-ray: Worsening right middle lung opacity could be due to pulmonary congestion versus pneumonia.POCUS done: Some B-lines more on the left.  No consolidation seen on lung POCUS.  Cardiac POCUS showing severely reduced EF.  IVC around 2.5 cm with without  much respiratory variation. 8/9 added Epi. Was already on fairly high dose norepi. No sig decrease on Norepi needs. Scr rising a little w/ on-going lasix . Changed to dobut in early evening. Pressors needs increased after I added dobut. Co-ox actually down. Renal fxn about the same.  8/10: Resting currently.  Got confused with Ambien  last night.  Pressor requirement starting to head back down since we have stopped the dobutamine  Interim History / Subjective:  No distress.  Still on high dose pressors Objective    Blood pressure (!) 97/58, pulse (!) 101, temperature 98 F (36.7 C), resp. rate (!) 34, height 6' (1.829 m), weight 83.5 kg, SpO2 97%.        Intake/Output Summary (Last 24 hours) at 05/11/2024 0706 Last data filed at 05/11/2024 0600 Gross per 24 hour  Intake 1968.12 ml  Output 2475 ml  Net -506.88 ml   Filed Weights   05/06/24 1624 05/08/24 0500 05/10/24 0702  Weight: 86 kg 83.8 kg 83.5 kg  General Pleasant 78 year old male patient currently laying in bed he is in no acute distress this morning, remains on nasal cannula support HEENT normocephalic atraumatic no JVD mucous membranes moist, right neck mass unchanged, vocal quality hoarse this is baseline Pulmonary: Diminished bases no accessory use Cardiac: Atrial fibrillation, irregular irregular, a little more tachycardic, no murmur rub or gallop Abdomen soft nontender no organomegaly Extremities warm dry brisk cap refill no significant edema Neuro: Awake, a little confused this morning, but improving over the  course of the morning moves all extremities no focal deficits. GU clear yellow.  Examination: Resolved problem list   Septic shock   Assessment and Plan  32 male with recently diagnosed tonsillar cancer on chemoradiation last chemotherapy last Thursday that is 7/31.  Currently neutropenic.  Also has history of CHF with EF less than 20% with CRT-D, A-fib on Eliquis  at home, CAD, bioprosthetic MVR, CKD, DM 2 and  hypothyroidism.   Shock. Mixed picture of neutropenic sepsis (NOS) and cardiogenic shock/ w/ Acute systolic heart failure (EF <20% w/ global hypokinesis) MRSA neg. Zyvox  stopped Still on high dose pressors, dobutamine  trial resulted in increased norepi needs.  Plan Keep euvolemic, hold lasix  today  Stop dobutamine . Repeat lactate and scvo2 later this am after dobut has been off.  Trend BNP MAP goal > 65; cont norepi and midodrine  (inc to 15 tid) Day 5 zosyn , complete 7d Not candidate for GDMT Will ask HF to stop by ([facility] back tomorrow. Follows as outpt) Will repeat cortisol again   Afib Plan Rate control Tele Cont to hold [facility] for now given bleeding risk; reassess once plts > 100K  AKI on CKD: -Baseline around 1.6-1.8. scr bumped a little more after yesterdays diuresis attempt. Looks euvolemic by exam plan No lasix  today  Renal dose meds Strict I&O Keep foley given worsening renal fx   Urinary retention: Plan Foley continue.  Pancytopenia: - Heme consulted. - Filgrastim  given on 8/7. Wbc rising. Hgb stable. Think some of bump reflect diuresis PLTs improved Plan Monitor  DM2: Plan Ssi still sub-optimal control Goal 140-180 Inc short acting TF coverage to 6 u q4 Add 10 units basal   Hypothyroidism: Plan Continue home Synthroid .  Tonsillar cancer on chemoradiation status post PEG: Plan Cont  Tube feeding. F/u OPS  Oral thrush plan P.o. Diflucan .  BRBPR->felt 2/2 hemorrhoids and prob complicated by thrombocytopenia and DOAC This seems to have have resolved  plan Topical Anusol  Stool softeners and fiber  Best Practice (right click and Reselect all SmartList Selections daily)   Diet/type: tubefeeds DVT prophylaxis SCD Pressure ulcer(s): N/A GI prophylaxis: PPI.  Lines: yes and it is still needed Foley:  Yes, and it is still needed Code Status:  full code Last date of multidisciplinary goals of care discussion [wife at bedside and regularly being  updated about the plan.  All questions answered.  CRITICAL CARE 35 minutes 05/11/2024, 7:06 AM

## 2024-05-11 NOTE — Plan of Care (Signed)
  Problem: Nutritional: Goal: Maintenance of adequate nutrition will improve Outcome: Progressing   Problem: Clinical Measurements: Goal: Diagnostic test results will improve Outcome: Progressing Goal: Respiratory complications will improve Outcome: Progressing

## 2024-05-11 NOTE — Progress Notes (Signed)
 Pharmacist Heart Failure Core Measure Documentation  Assessment: Christian Bowman has an EF documented as 20% on 05/08/24 by ECHO.  Rationale: Heart failure patients with left ventricular systolic dysfunction (LVSD) and an EF < 40% should be prescribed an angiotensin converting enzyme inhibitor (ACEI) or angiotensin receptor blocker (ARB) at discharge unless a contraindication is documented in the medical record.  This patient is not currently on an ACEI or ARB for HF.  This note is being placed in the record in order to provide documentation that a contraindication to the use of these agents is present for this encounter.  ACE Inhibitor or Angiotensin Receptor Blocker is contraindicated (specify all that apply)  []   ACEI allergy AND ARB allergy []   Angioedema []   Moderate or severe aortic stenosis []   Hyperkalemia [x]   Hypotension - on pressors []   Renal artery stenosis []   Worsening renal function, preexisting renal disease or dysfunction   [name] 05/11/2024 3:18 PM

## 2024-05-11 NOTE — Consult Note (Signed)
 Advanced Heart Failure Team History and Physical Note   Primary Physician: Pcp, No Primary Cardiologist:  Croitoru HF MD: DM  Reason for Admission: rectal bleeding/shock   HPI:    Christian Bowman is a 78 y.o. 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 He has been followed by Dr. Alease in the [facility].   He presented to [facility] ED on 8/5 with rectal bleeding. He was admitted by the hospitalist team and was evaluated by GI on 8/6.  Was felt that his rectal bleeding was most likely secondary to hemorrhoids for which medical management was recommended. Over the course of the day, he had ongoing hypotension despite multiple fluid boluses.  He subsequently developed profound sepsis and was moved to ICU.   HF meds have been stopped. And has been on high-dose NE and oral midodrine   PCT 9.7  Scr 1.5-> 2.5 LA 1.4 Co-ox 63% BNP 4,153 BCx 05/07/24 x 2 NGTD UA ok CXR 8/9 with pulmonary edema  Wife at bedside. He feels weak, Mild SOB. No CP    Home Medications Prior to Admission medications   Medication Sig Start Date End Date Taking? Authorizing Provider  acetaminophen  (TYLENOL ) 325 MG tablet Take 325 mg by mouth every 6 (six) hours as needed for mild pain (pain score 1-3).   Yes [provider]  apixaban  (ELIQUIS ) 5 MG TABS tablet Take 1 tablet (5 mg total) by mouth 2 (two) times daily. 01/23/24  Yes Croitoru, Mihai, MD  carvedilol  (COREG ) 12.5 MG tablet TAKE 1 & 1/2 (ONE & ONE-HALF) TABLETS BY MOUTH TWICE DAILY WITH A MEAL 02/20/24  Yes Rolan Ezra RAMAN, MD  Cholecalciferol  (VITAMIN D ) 50 MCG (2000 UT) tablet Take 2,000 Units by mouth daily with  breakfast.   Yes [provider]  dapagliflozin  propanediol (FARXIGA ) 10 MG TABS tablet Take 1 tablet (10 mg total) by mouth daily. 12/26/23  Yes Croitoru, Mihai, MD  dexamethasone  (DECADRON ) 4 MG tablet Take 2 tablets daily for 2 days, start the day after chemotherapy. Take with food. Patient taking differently: Take 8 mg by mouth daily as needed (Take only after chemotherapy per wife. Take with food.). 03/21/24  Yes Pasam, Avinash, MD  digoxin  (LANOXIN ) 0.125 MG tablet Take 1/2 (one-half) tablet by mouth once daily 12/26/23  Yes Croitoru, Mihai, MD  eplerenone  (INSPRA ) 50 MG tablet Take 1 tablet (50 mg total) by mouth daily. 06/11/23  Yes Rolan Ezra RAMAN, MD  fluconazole  (DIFLUCAN ) 10 MG/ML suspension Take 20mL per tube today, then 10mL per tube daily for 20 more days. HOLD ROSUVASTATIN  WHILE ON THIS. Patient taking differently: Place 20 mLs into feeding tube daily. Take 20mL per tube today, then 10mL per tube daily for 20 more days. HOLD ROSUVASTATIN  WHILE ON THIS. 04/28/24  Yes Izell Domino, MD  furosemide  (LASIX ) 20 MG tablet Take 1 tablet (20 mg total) by mouth daily as needed (for a weight over 190 lbs). 12/26/23  Yes Croitoru, Mihai, MD  glipiZIDE  (GLUCOTROL ) 5 MG tablet Take 1 tablet (5 mg total) by mouth daily before breakfast. Only one time daily at breakfast. 04/30/24  Yes Nida, Ethelle ORN, MD  guaiFENesin  300 MG/15ML LIQD Place 600  mg into feeding tube 4 (four) times daily as needed. Patient taking differently: Place 600 mg into feeding tube 4 (four) times daily as needed (for congestion). 05/01/24  Yes Boscia, Heather E, NP  levothyroxine  (SYNTHROID ) 50 MCG tablet Take 1 tablet (50 mcg total) by mouth daily before breakfast. 03/19/24  Yes Nida, Gebreselassie W, MD  lidocaine  (XYLOCAINE ) 2 % solution Patient: Mix 1part 2% viscous lidocaine , 1part H20. Swish & swallow 10mL of diluted mixture, 30min before meals and at bedtime, up to QID 04/15/24  Yes Izell Domino, MD   lidocaine -prilocaine  (EMLA ) cream Apply to affected area once 03/21/24  Yes Pasam, Avinash, MD  neomycin-bacitracin-polymyxin (NEOSPORIN) ointment Apply 1 application  topically daily as needed for wound care.   Yes [provider]  nitroGLYCERIN  (NITROSTAT ) 0.4 MG SL tablet DISSOLVE 1 TABLET UNDER THE TONGUE EVERY 5 MINUTES AS NEEDED FOR CHEST PAIN, DO NOT EXCEED 3 DOSES IN 15 MINUTES 12/26/23  Yes Croitoru, Mihai, MD  Nutritional Supplements (FEEDING SUPPLEMENT, OSMOLITE 1.5 CAL,) LIQD Give 2 cartons at 8am, noon, 4pm bolus syringe and 1 carton at 8pm.  Flush with 60ml of water before and after each feeding.  Give additional 240ml TID between feedings for additional hydration. 04/28/24  Yes Izell Domino, MD  ondansetron  (ZOFRAN ) 8 MG tablet Take 1 tablet (8 mg total) by mouth every 8 (eight) hours as needed for nausea or vomiting. Start on the third day after chemotherapy. 03/21/24  Yes Pasam, Avinash, MD  oxyCODONE  (ROXICODONE ) 5 MG/5ML solution Place 5 mLs (5 mg total) into feeding tube every 6 (six) hours as needed for severe pain (pain score 7-10). 05/01/24  Yes Boscia, Heather E, NP  polyethylene glycol (MIRALAX ) 17 g packet Take 17 g by mouth daily as needed for mild constipation or moderate constipation.   Yes [provider]  prochlorperazine  (COMPAZINE ) 10 MG tablet Take 1 tablet (10 mg total) by mouth every 6 (six) hours as needed for nausea or vomiting. 03/21/24  Yes Pasam, Avinash, MD  rosuvastatin  (CRESTOR ) 10 MG tablet Take 1 tablet (10 mg total) by mouth daily. 06/11/23  Yes Rolan Ezra RAMAN, MD  sacubitril -valsartan  (ENTRESTO ) 97-103 MG Take 1 tablet by mouth twice daily 04/07/24  Yes McLean, Dalton S, MD  Tetrahydrozoline HCl (VISINE OP) Place 1 drop into both eyes daily as needed (irritation).   Yes [provider]  Blood Glucose Monitoring Suppl (ACCU-CHEK GUIDE ME) w/Device KIT 1 Piece by Does not apply route as directed. 02/01/22   Nida, Gebreselassie W, MD   glucose blood (ACCU-CHEK GUIDE TEST) test strip Use to test blood glucose daily as instructed 03/17/24   Nida, Gebreselassie W, MD  sodium zirconium cyclosilicate  (LOKELMA ) 10 g PACK packet DISSOLVE 1 PACKET IN WATER & DRINK ONCE DAILY 09/24/23   Croitoru, Jerel, MD    Past Medical History: Past Medical History:  Diagnosis Date   AICD (automatic cardioverter/defibrillator) present    medtronic-   DR. CROITORU , DR. ROLAN    Anginal pain (HCC)    cp sat 08/11/17   Anxiety    CAD (coronary artery disease)    CHF (congestive heart failure) (HCC)    Complication of anesthesia    took awhile to wake up    Coronary artery disease involving coronary bypass graft    Cyst of neck    right side   DM2 (diabetes mellitus, type 2) (HCC) 08/26/2013   Dyspnea    Heart attack (HCC)    not sure when (08/20/2017)  HTN (hypertension) 08/26/2013   Hyperlipidemia 08/26/2013   Hypothyroidism    Left main coronary artery disease    Left renal artery stenosis (HCC)    Genesis 6x12 stent 2007   Localized cancer of throat (HCC)    Obesity (BMI 30.0-34.9) 08/26/2013   Postoperative atrial fibrillation (HCC) 10/15/2005   Presence of permanent cardiac pacemaker    S/P CABG x 4 10/13/2005   LIMA to LAD, SVG to intermediate branch, sequential SVG to PDA and RPL branch, EVH via right thigh   S/P mitral valve replacement with bioprosthetic valve 07/11/2016   31 mm [facility] Mitral bovine bioprosthetic tissue valve   S/P redo CABG x 2 07/11/2016   SVG to PDA and SVG to Intermediate Branch, EVH via left thigh    Past Surgical History: Past Surgical History:  Procedure Laterality Date   A-FLUTTER ABLATION N/A 05/11/2017   Procedure: A-Flutter Ablation;  Surgeon: Inocencio Soyla Lunger, MD;  Location: [facility] INVASIVE CV LAB;  Service: Cardiovascular;  Laterality: N/A;   BIV ICD GENERATOR CHANGEOUT N/A 08/29/2023   Procedure: BIV ICD GENERATOR CHANGEOUT;  Surgeon: Inocencio Soyla Lunger, MD;  Location: [facility]  INVASIVE CV LAB;  Service: Cardiovascular;  Laterality: N/A;   CARDIAC CATHETERIZATION N/A 06/21/2016   Procedure: Right/Left Heart Cath and Coronary/Graft Angiography;  Surgeon: Ozell Fell, MD;  Location: [facility] INVASIVE CV LAB;  Service: Cardiovascular;  Laterality: N/A;   CARDIAC VALVE REPLACEMENT     CARDIOVERSION N/A 07/19/2016   Procedure: CARDIOVERSION;  Surgeon: Redell GORMAN Shallow, MD;  Location: [facility] ENDOSCOPY;  Service: Cardiovascular;  Laterality: N/A;   CARDIOVERSION N/A 09/08/2016   Procedure: CARDIOVERSION;  Surgeon: Vina LULLA Gull, MD;  Location: [facility] ENDOSCOPY;  Service: Cardiovascular;  Laterality: N/A;   CORONARY ANGIOPLASTY     STENT 2016  [facility]   CORONARY ANGIOPLASTY WITH STENT PLACEMENT     DES in SVG to right coronary artery system   CORONARY ARTERY BYPASS GRAFT  10/13/2005   LIMA to LAD, SVG to intermediate branch, sequential SVG to PDA and RPL   CORONARY ARTERY BYPASS GRAFT N/A 07/11/2016   Procedure: REDO CORONARY ARTERY BYPASS GRAFTING (CABG) x two using left leg greater saphenous vein harvested endoscopically-SVG to PDA -SVG to RAMUS INTERMEDIATE;  Surgeon: Sudie VEAR Laine, MD;  Location: [facility] OR;  Service: Open Heart Surgery;  Laterality: N/A;   CORONARY ARTERY BYPASS GRAFT N/A 07/11/2016   Procedure: Re-exploration (CABG) for post op bleeding,;  Surgeon: Sudie VEAR Laine, MD;  Location: [facility] OR;  Service: Open Heart Surgery;  Laterality: N/A;   EP IMPLANTABLE DEVICE N/A 07/25/2016   Procedure: BiV ICD Insertion CRT-D;  Surgeon: Will Lunger Inocencio, MD;  Location: [facility] INVASIVE CV LAB;  Service: Cardiovascular;  Laterality: N/A;   INGUINAL HERNIA REPAIR Left 08/20/2017   INGUINAL HERNIA REPAIR Left 08/20/2017   Procedure: OPEN LEFT INGUINAL HERNIA REPAIR;  Surgeon: Ethyl Lenis, MD;  Location: [facility] OR;  Service: General;  Laterality: Left;   IR GASTROSTOMY TUBE MOD SED  04/09/2024   IR IMAGING GUIDED PORT INSERTION  04/09/2024   LEFT HEART CATH AND CORS/GRAFTS ANGIOGRAPHY  N/A 12/18/2016   Procedure: Left Heart Cath and Cors/Grafts Angiography;  Surgeon: Ozell Fell, MD;  Location: [facility] INVASIVE CV LAB;  Service: Cardiovascular;  Laterality: N/A;   LEFT HEART CATH AND CORS/GRAFTS ANGIOGRAPHY N/A 08/22/2017   Procedure: LEFT HEART CATH AND CORS/GRAFTS ANGIOGRAPHY;  Surgeon: Rolan Ezra GORMAN, MD;  Location: [facility] INVASIVE CV LAB;  Service: Cardiovascular;  Laterality: N/A;  MITRAL VALVE REPLACEMENT N/A 07/11/2016   Procedure: MITRAL VALVE (MV) REPLACEMENT;  Surgeon: Sudie VEAR Laine, MD;  Location: [facility] OR;  Service: Open Heart Surgery;  Laterality: N/A;   MYOCARDICAL PERFUSION  10/09/2007   NORMAL PERFUSION IN ALL REGIONS;NO EVIDENCE OF INDUCIBLE ISCHEMIA;POST STRESS EF% 66   RENAL ARTERY STENT Right 2007   RENAL DOPPLER  03/28/2010   RIGHT RA-NORMAL;LEFT PROXIMAL RA AT STENT-PATENT WITH NO EVIDENCE OF SIGN DIAMETER REDUCTION. R & L KIDNEYS: EQUAL IN SIZE,SYMMETRICAL IN SHAPE.   RIGHT HEART CATH N/A 01/08/2020   Procedure: RIGHT HEART CATH;  Surgeon: Rolan Ezra RAMAN, MD;  Location: [facility] INVASIVE CV LAB;  Service: Cardiovascular;  Laterality: N/A;   TEE WITHOUT CARDIOVERSION N/A 06/15/2016   Procedure: TRANSESOPHAGEAL ECHOCARDIOGRAM (TEE);  Surgeon: Jerel Balding, MD;  Location: [facility] ENDOSCOPY;  Service: Cardiovascular;  Laterality: N/A;   TEE WITHOUT CARDIOVERSION N/A 07/11/2016   Procedure: TRANSESOPHAGEAL ECHOCARDIOGRAM (TEE);  Surgeon: Sudie VEAR Laine, MD;  Location: [facility] OR;  Service: Open Heart Surgery;  Laterality: N/A;   TEE WITHOUT CARDIOVERSION N/A 07/19/2016   Procedure: TRANSESOPHAGEAL ECHOCARDIOGRAM (TEE);  Surgeon: Redell RAMAN Shallow, MD;  Location: [facility] ENDOSCOPY;  Service: Cardiovascular;  Laterality: N/A;   TRANSESOPHAGEAL ECHOCARDIOGRAM  10/19/2005   NORMAL LV; MILD TO MODERATE AMOUNT OF SOFT ATHEROMATOUS PLAQUE OF THE THORACIC AORTA; THE LEFT ATRIUM IS MILDLY DILATED;LEFT ATRIAL APPENDAGE FUNCTION IS NORMAL;NO THROMBUS IDENTIFIED. SMALL PFO WITH RIGHT TO LEFT SHUNT     Family History: Family History  Problem Relation Age of Onset   Heart attack Mother    Heart disease Mother    Heart attack Father    Heart disease Father    Hypertension Father    Hypertension Sister    Heart disease Maternal Grandmother     Social History: Social History   Socioeconomic History   Marital status: Married    Spouse name: Not on file   Number of children: 0   Years of education: 18   Highest education level: Not on file  Occupational History   Occupation: retired  Tobacco Use   Smoking status: Never   Smokeless tobacco: Never  Vaping Use   Vaping status: Never Used  Substance and Sexual Activity   Alcohol use: No   Drug use: No   Sexual activity: Never  Other Topics Concern   Not on file  Social History Narrative   Right handed   Two story home   Drinks half and half coffee   Social Drivers of Health   Financial Resource Strain: Not on file  Food Insecurity: No Food Insecurity (05/07/2024)   Hunger Vital Sign    Worried About Running Out of Food in the Last Year: Never true    Ran Out of Food in the Last Year: Never true  Transportation Needs: No Transportation Needs (05/07/2024)   PRAPARE - Administrator, Civil Service (Medical): No    Lack of Transportation (Non-Medical): No  Physical Activity: Not on file  Stress: Not on file  Social Connections: Moderately Integrated (05/07/2024)   Social Connection and Isolation Panel    Frequency of Communication with Friends and Family: Once a week    Frequency of Social Gatherings with Friends and Family: Twice a week    Attends Religious Services: More than 4 times per year    Active Member of [employer] or Organizations: No    Attends Banker Meetings: Never    Marital Status: Married    Allergies:  Allergies  Allergen Reactions   Xanax [Alprazolam] Other (See Comments)    Pt feels very weak, tired and feels paralyzed     Ambien  [Zolpidem ] Other (See Comments)     Delirium     Objective:    Vital Signs:   Temp:  [98 F (36.7 C)-98.6 F (37 C)] 98.3 F (36.8 C) (08/10 1121) Pulse Rate:  [80-115] 101 (08/10 0815) Resp:  [12-42] 38 (08/10 0815) BP: (75-133)/(37-99) 125/72 (08/10 0815) SpO2:  [90 %-98 %] 98 % (08/10 0815) Last BM Date : 05/11/24 Filed Weights   05/06/24 1624 05/08/24 0500 05/10/24 0702  Weight: 86 kg 83.8 kg 83.5 kg    Physical Exam: General:  Ill appearing. Mildly tachypneic HEENT: normal Neck: supple. JVP 8-9 .  Cor: Reg tachy + s3 Lungs: dull R base Abdomen: soft, nontender, nondistended. No hepatosplenomegaly. No bruits or masses. Good bowel sounds. Extremities: no cyanosis, clubbing, rash, edema Neuro: alert & orientedx3, cranial nerves grossly intact. moves all 4 extremities w/o difficulty. Affect pleasant  Telemetry: V pacing 100-110 + PVCs Personally reviewed   Labs: Basic Metabolic Panel: Recent Labs  Lab 05/07/24 0541 05/07/24 1609 05/08/24 0425 05/08/24 0434 05/08/24 1336 05/09/24 0401 05/10/24 0222 05/10/24 1657 05/11/24 0210  NA 135 133* 132*  --  133* 139 135 135 138  K 4.9 5.0 4.5  --  4.1 4.4 3.5 3.8 3.7  CL 96* 99 98  --   --  101 99 99 101  CO2 29 25 25   --   --  28 25 24 25   GLUCOSE 106* 152* 266*  --   --  193* 112* 219* 176*  BUN 48* 47* 43*  --   --  41* 47* 50* 52*  CREATININE 1.48* 1.60* 1.81*  --   --  2.37* 2.40* 2.47* 2.48*  CALCIUM  9.0 8.3* 8.4*  --   --  8.7* 8.8* 8.5* 8.9  MG 1.8  --   --  1.6*  --  2.5* 2.2  --   --   PHOS  --  2.5 3.4  --   --  2.8 2.6  --   --     Liver Function Tests: Recent Labs  Lab 05/07/24 1609 05/08/24 0425 05/10/24 0222 05/10/24 1657 05/11/24 0210  AST 14* 15 24 21 29   ALT 18 17 29 29  36  ALKPHOS 36* 34* 47 48 65  BILITOT 2.3* 3.9* 2.3* 1.9* 2.0*  PROT 4.6* 5.4* 6.1* 5.4* 5.8*  ALBUMIN  2.1* 2.7* 2.3* 2.0* 2.1*   No results for input(s): LIPASE, AMYLASE in the last 168 hours. No results for input(s): AMMONIA in the last 168  hours.  CBC: Recent Labs  Lab 05/06/24 1640 05/07/24 0541 05/07/24 1609 05/08/24 0425 05/08/24 1336 05/09/24 0401 05/10/24 0222 05/11/24 0210  WBC 2.1* 1.7*  --  0.8*  --  2.0* 6.1 12.9*  NEUTROABS 1.8 1.4*  --  0.7*  --  1.6*  --   --   HGB 13.0 13.0   < > 10.2* 15.6 10.4* 12.4* 12.0*  HCT 39.4 38.7*   < > 29.7* 46.0 30.6* 36.7* 35.5*  MCV 93.6 91.9  --  90.3  --  90.8 91.1 90.3  PLT 59* 56*  --  47*  --  58* 73* 74*   < > = values in this interval not displayed.    Cardiac Enzymes: No results for input(s): CKTOTAL, CKMB, CKMBINDEX, TROPONINI in the last 168 hours.  BNP: BNP (last 3 results)  Recent Labs    05/08/24 0424 05/09/24 0401 05/11/24 0813  BNP 3,366.2* 3,731.2* 4,153.6*    ProBNP (last 3 results) No results for input(s): PROBNP in the last 8760 hours.   CBG: Recent Labs  Lab 05/10/24 2318 05/11/24 0341 05/11/24 0713 05/11/24 0926 05/11/24 1120  GLUCAP 100* 226* 189* 203* 150*    Coagulation Studies: No results for input(s): LABPROT, INR in the last 72 hours.  Other results:  Imaging: DG Chest Port 1 View Result Date: 05/10/2024 CLINICAL DATA:  Pulmonary edema. EXAM: PORTABLE CHEST 1 VIEW COMPARISON:  Radiograph 05/08/2024, CT 02/18/2024 FINDINGS: Accessed right chest port remains in place. Left-sided pacemaker remains in place. Stable cardiomegaly post median sternotomy and prosthetic cardiac valve. Stable mediastinal contours with prominence of the right hilum corresponding to pulmonary artery prominence on CT. Improving pulmonary edema. No significant pleural effusion. No pneumothorax. IMPRESSION: 1. Improving pulmonary edema. 2. Stable cardiomegaly. Electronically Signed   By: Andrea Gasman M.D.   On: 05/10/2024 14:26       Assessment/Plan:   1. Shock - neutropenic sepsis - likely primarily septic physiology however he continues with sever HF and s3 on exam - Bcx x 2 NGT - now improved with broad spectrum abx and  addition of VP - Can wean NE slowly as tolerated.  - Follow Co-ox  2. Acute on chronic systolic CHF: Ischemic cardiomyopathy.  Medtronic CRT-D.  TEE 10/17 with EF 20-25%.  Echo 3/21 EF 20-25%, nl RV, nl bioprosthetic MVR.  CPX 3/21 severe functional impairment due to HF with concern for poor short-term prognosis.  RHC in 4/21 showed normal filling pressures but low cardiac index. Last echo 9/24 EF < 20% with global hypokinesis, He is not a transplant candidate.  He has had 2 prior sternotomies, which makes LVAD more complicated.  We discussed LVAD extensively.  He is very clear that he does not want another cardiac surgery.   - off GDMT with shock  - Continue NE support - he is volume overloaded - gentle diuresis with lasix  40mg  IV x 1 today  3. Tonsillar CA (HPV +) - currently getting chemo and XRT - Dr. Timmy following. Feels there is strong chance of cure  4. CAD: s/p redo CABG.  Admission in 3/18 with NSTEMI, LHC showed occlusion of SVG-PDA from CABG#1 but patent SVG-PDA from CABG#2, no intervention.  He had a post-op NSTEMI after inguinal hernia repair in 11/18.  LHC showed occlusion of a PLV branch that had been backfilled by SVG-PDA (prior cath had shown severe diffuse disease in the PLV).  - no current s/s angina  - on statin  5. Bioprosthetic mitral valve: stable  6. Atrial flutter/fibrillation: s/p flutter ablation in 8/18.  In 4/25, runs of atrial fibrillation were seen by device interrogation and apixaban  was started.  - hard to assess current underlying rhythm with pacing and tachycardia - was on NSR on admit  - Restart AC as able  7. AKI on CKD: Stage III.   - Scr 1.5 at baseline now stabilizing at 2.5  - suspect ATN    CRITICAL CARE Performed by: Cherrie Sieving  Total critical care time: 50 minutes  Critical care time was exclusive of separately billable procedures and treating other patients.  Critical care was necessary to treat or prevent imminent or  life-threatening deterioration.  Critical care was time spent personally by me (independent of midlevel providers or residents) on the following activities: development of treatment plan with patient and/or surrogate as well  as nursing, discussions with consultants, evaluation of patient's response to treatment, examination of patient, obtaining history from patient or surrogate, ordering and performing treatments and interventions, ordering and review of laboratory studies, ordering and review of radiographic studies, pulse oximetry and re-evaluation of patient's condition.   Length of Stay: 3   Toribio Fuel MD 05/11/2024, 11:37 AM  Advanced Heart Failure Team Pager [phone] (M-F; 7a - 4p)  Please contact [facility] for night-coverage after hours (4p -7a ) and weekends on [email or web address]

## 2024-05-12 ENCOUNTER — Ambulatory Visit

## 2024-05-12 DIAGNOSIS — R579 Shock, unspecified: Secondary | ICD-10-CM | POA: Diagnosis not present

## 2024-05-12 DIAGNOSIS — N179 Acute kidney failure, unspecified: Secondary | ICD-10-CM

## 2024-05-12 DIAGNOSIS — I472 Ventricular tachycardia, unspecified: Secondary | ICD-10-CM

## 2024-05-12 DIAGNOSIS — I4891 Unspecified atrial fibrillation: Secondary | ICD-10-CM | POA: Diagnosis not present

## 2024-05-12 DIAGNOSIS — I5023 Acute on chronic systolic (congestive) heart failure: Secondary | ICD-10-CM | POA: Diagnosis not present

## 2024-05-12 DIAGNOSIS — K625 Hemorrhage of anus and rectum: Secondary | ICD-10-CM | POA: Diagnosis not present

## 2024-05-12 LAB — COMPREHENSIVE METABOLIC PANEL WITH GFR
ALT: 37 U/L (ref 0–44)
ALT: 39 U/L (ref 0–44)
AST: 26 U/L (ref 15–41)
AST: 32 U/L (ref 15–41)
Albumin: 2 g/dL — ABNORMAL LOW (ref 3.5–5.0)
Albumin: 2.1 g/dL — ABNORMAL LOW (ref 3.5–5.0)
Alkaline Phosphatase: 70 U/L (ref 38–126)
Alkaline Phosphatase: 78 U/L (ref 38–126)
Anion gap: 9 (ref 5–15)
Anion gap: 11 (ref 5–15)
BUN: 58 mg/dL — ABNORMAL HIGH (ref 8–23)
BUN: 62 mg/dL — ABNORMAL HIGH (ref 8–23)
CO2: 25 mmol/L (ref 22–32)
CO2: 29 mmol/L (ref 22–32)
Calcium: 8.7 mg/dL — ABNORMAL LOW (ref 8.9–10.3)
Calcium: 9.1 mg/dL (ref 8.9–10.3)
Chloride: 106 mmol/L (ref 98–111)
Chloride: 104 mmol/L (ref 98–111)
Creatinine, Ser: 2.3 mg/dL — ABNORMAL HIGH (ref 0.61–1.24)
Creatinine, Ser: 2.31 mg/dL — ABNORMAL HIGH (ref 0.61–1.24)
GFR, Estimated: 29 mL/min — ABNORMAL LOW (ref >60)
GFR, Estimated: 29 mL/min — ABNORMAL LOW (ref >60)
Glucose, Bld: 262 mg/dL — ABNORMAL HIGH (ref 70–99)
Glucose, Bld: 123 mg/dL — ABNORMAL HIGH (ref 70–99)
Potassium: 4.4 mmol/L (ref 3.5–5.1)
Potassium: 3.7 mmol/L (ref 3.5–5.1)
Sodium: 140 mmol/L (ref 135–145)
Sodium: 144 mmol/L (ref 135–145)
Total Bilirubin: 1.4 mg/dL — ABNORMAL HIGH (ref 0.0–1.2)
Total Bilirubin: 1.3 mg/dL — ABNORMAL HIGH (ref 0.0–1.2)
Total Protein: 5.5 g/dL — ABNORMAL LOW (ref 6.5–8.1)
Total Protein: 5.6 g/dL — ABNORMAL LOW (ref 6.5–8.1)

## 2024-05-12 LAB — CBC
HCT: 30.9 % — ABNORMAL LOW (ref 39.0–52.0)
Hemoglobin: 10.6 g/dL — ABNORMAL LOW (ref 13.0–17.0)
MCH: 31.4 pg (ref 26.0–34.0)
MCHC: 34.3 g/dL (ref 30.0–36.0)
MCV: 91.4 fL (ref 80.0–100.0)
Platelets: 77 K/uL — ABNORMAL LOW (ref 150–400)
RBC: 3.38 MIL/uL — ABNORMAL LOW (ref 4.22–5.81)
RDW: 13.2 % (ref 11.5–15.5)
WBC: 18.5 K/uL — ABNORMAL HIGH (ref 4.0–10.5)
nRBC: 0 % (ref 0.0–0.2)

## 2024-05-12 LAB — GLUCOSE, CAPILLARY
Glucose-Capillary: 236 mg/dL — ABNORMAL HIGH (ref 70–99)
Glucose-Capillary: 213 mg/dL — ABNORMAL HIGH (ref 70–99)
Glucose-Capillary: 230 mg/dL — ABNORMAL HIGH (ref 70–99)
Glucose-Capillary: 206 mg/dL — ABNORMAL HIGH (ref 70–99)
Glucose-Capillary: 157 mg/dL — ABNORMAL HIGH (ref 70–99)
Glucose-Capillary: 119 mg/dL — ABNORMAL HIGH (ref 70–99)

## 2024-05-12 LAB — CULTURE, BLOOD (ROUTINE X 2)
Culture: NO GROWTH
Culture: NO GROWTH
Special Requests: ADEQUATE
Special Requests: ADEQUATE

## 2024-05-12 MED ORDER — NUTRISOURCE FIBER PO PACK
1.0000 | PACK | Freq: Two times a day (BID) | ORAL | Status: DC
  Administered 2024-05-12 – 2024-05-19 (×18): 1
  Filled 2024-05-12 (×15): qty 1

## 2024-05-12 MED ORDER — INSULIN GLARGINE-YFGN 100 UNIT/ML ~~LOC~~ SOLN
20.0000 [IU] | Freq: Every day | SUBCUTANEOUS | Status: DC
  Administered 2024-05-12 – 2024-05-13 (×4): 20 [IU] via SUBCUTANEOUS
  Filled 2024-05-12 (×4): qty 0.2

## 2024-05-12 MED ORDER — FUROSEMIDE 10 MG/ML IJ SOLN
40.0000 mg | Freq: Two times a day (BID) | INTRAMUSCULAR | Status: AC
  Administered 2024-05-12 (×4): 40 mg via INTRAVENOUS
  Filled 2024-05-12 (×2): qty 4

## 2024-05-12 NOTE — Progress Notes (Addendum)
 eLink Physician-Brief Progress Note Patient Name: Christian Bowman DOB: 12-06-1945 MRN: [id number]   Date of Service  05/12/2024  HPI/Events of Note  Left-sided chest pain.  78 year old male with an extensive cardiac history who is in the ICU for shock and sepsis.  Developed chest pain this evening and informed RN.  Pain resolved spontaneously before EKG could be done.  Currently chest pain-free.  eICU Interventions  Patient's chart reviewed.  EKG reviewed.  No changes suggestive of acute ischemia.  Video assessment of patient done.  He is sitting up comfortably in bed.  Denies pain.  Suspect pain was secondary to gas. Will continue to monitor patient on unit.   RN present for video interaction.     Intervention Category Intermediate Interventions: Pain - evaluation and management  Jerilynn Berg 05/12/2024, 10:21 PM  Ticket received Patient is asking for diet.  He was seen by speech therapy yesterday which recommended that he could have snacks from the floor stock with aspiration precautions. Order placed to that effect.  RN notified about order being placed.

## 2024-05-12 NOTE — Progress Notes (Addendum)
 Advanced Heart Failure Rounding Note  HF Cardiologist: Ezra Shuck, MD  Chief Complaint: Neutropenic / Septic Shock Patient Profile   Christian Bowman is a 78 y.o. 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   Subjective:    Pressor requirment significantly improved overnight. On NE 2 + VP 0.04. Now on room air. 1.4L UOP (net + 600cc) with Lasix  40 mg IV.   Sitting up in bed, feeling better this morning. No shortness of breath or CP .   Objective:    Weight Range: 79.6 kg Body mass index is 23.8 kg/m.   Vital Signs:   Temp:  [97.7 F (36.5 C)-98.3 F (36.8 C)] 98 F (36.7 C) (08/11 0302) Pulse Rate:  [56-123] 60 (08/11 0630) Resp:  [9-44] 19 (08/11 0630) BP: (93-152)/(52-137) 99/55 (08/11 0630) SpO2:  [88 %-99 %] 94 % (08/11 0630) Weight:  [79.6 kg-79.9 kg] 79.6 kg (08/11 0500) Last BM Date : 05/11/24  Weight change: Filed Weights   05/10/24 0702 05/11/24 0706 05/12/24 0500  Weight: 83.5 kg 79.9 kg 79.6 kg   Intake/Output:  Intake/Output Summary (Last 24 hours) at 05/12/2024 0659 Last data filed at 05/12/2024 0600 Gross per 24 hour  Intake 2268.44 ml  Output 1610 ml  Net 658.44 ml    Physical Exam    General: Chronically ill appearing. No distress on RA Cardiac: JVP to jaw. S1 and S2 present. S3 present Extremities: Warm and dry.  No BLE edema.  Neuro: Alert and oriented x3. Affect pleasant.  Telemetry   VP at 60, NSVT overnight 15-16b x2 (personally reviewed)  Labs    CBC Recent Labs    05/11/24 0210 05/12/24 0235  WBC 12.9* 18.5*  HGB 12.0* 10.6*  HCT 35.5* 30.9*  MCV 90.3 91.4  PLT 74* 77*    Basic Metabolic Panel Recent Labs    91/90/74 0222 05/10/24 1657 05/11/24 1743 05/12/24 0235  NA 135   < > 135 140  K 3.5   < > 3.2* 4.4  CL 99   < > 100 106  CO2 25   < > 23 25  GLUCOSE 112*   < > 375* 262*  BUN 47*   < > 48* 58*  CREATININE 2.40*   < > 2.01* 2.30*  CALCIUM  8.8*   < > 7.9* 8.7*  MG 2.2  --   --   --   PHOS 2.6  --   --   --    < > = values in this interval not displayed.   Liver Function Tests Recent Labs    05/11/24 1743 05/12/24 0235  AST 25 26  ALT 33 37  ALKPHOS 59 70  BILITOT 1.4* 1.4*  PROT 4.8* 5.5*  ALBUMIN  1.8* 2.0*   BNP (last 3 results) Recent Labs    05/08/24 0424 05/09/24 0401 05/11/24 0813  BNP 3,366.2* 3,731.2* 4,153.6*   Imaging   No results found.  Medications:    Scheduled Medications:  Chlorhexidine  Gluconate Cloth  6 each Topical Daily   feeding supplement (PROSource TF20)  60 mL Per Tube BID   fluconazole   100 mg Per Tube Daily   hydrocortisone    Rectal  BID   insulin  aspart  0-20 Units Subcutaneous Q4H   insulin  aspart  6 Units Subcutaneous Q4H   insulin  glargine-yfgn  10 Units Subcutaneous Daily   levothyroxine   50 mcg Per Tube QAC breakfast   pantoprazole  (PROTONIX ) IV  40 mg Intravenous Q24H   rosuvastatin   10 mg Per Tube Daily   sodium chloride  flush  10-40 mL Intracatheter Q12H   sodium chloride  flush  3 mL Intravenous Q12H    Infusions:  feeding supplement (OSMOLITE 1.5 CAL) 60 mL/hr at 05/12/24 0600   norepinephrine  (LEVOPHED ) Adult infusion 2 mcg/min (05/12/24 0600)   piperacillin -tazobactam (ZOSYN )  IV 12.5 mL/hr at 05/12/24 0600   vasopressin  0.04 Units/min (05/12/24 0600)    PRN Medications: acetaminophen  **OR** acetaminophen , ondansetron  (ZOFRAN ) IV, oxyCODONE , senna-docusate, sodium chloride  flush  Assessment/Plan   1. Neutropenic / Septic Shock  - likely primarily septic physiology however he continues with severe HF and S3 on exam. - PCT 9.19. Bcx x 2 negative. - Afebrile. WBC  1.7>6>12.9>18.5 - ABX per CCM - pressor requirment significantly improved.  - on NE 2 + VP 0.04, continue to wean   2. Acute on chronic systolic CHF: Ischemic cardiomyopathy.  [employer] CRT-D.  TEE 10/17 with EF 20-25%.  Echo 3/21 EF 20-25%, nl RV, nl bioprosthetic MVR.  CPX 3/21 severe functional impairment due to HF with concern for poor short-term prognosis.  RHC in 4/21 showed normal filling pressures but low cardiac index. Last echo 9/24 EF < 20% with global hypokinesis. Echo this admission unchanged. He is not a transplant candidate.  He has had 2 prior sternotomies, which makes LVAD more complicated.  We discussed LVAD extensively.  He is very clear that he does not want another cardiac surgery.   - off GDMT with shock  - continue NE support - he is volume overloaded  - gentle diuresis with lasix  40 mg IV bid today.    3. Tonsillar CA (HPV +) - currently getting chemo and XRT - Dr. Timmy following. Feels there is strong chance of cure   4. CAD: s/p redo CABG.  Admission in 3/18 with NSTEMI, LHC showed occlusion of SVG-PDA from CABG#1 but patent SVG-PDA from CABG#2, no intervention.  He had a post-op NSTEMI after inguinal hernia repair in 11/18.  LHC showed occlusion of a PLV branch that had been backfilled by SVG-PDA (prior cath had shown severe diffuse disease in the PLV).  - no current s/s angina  - on statin   5. Bioprosthetic mitral valve: stable   6. Atrial flutter/fibrillation: s/p flutter ablation in 8/18.  In 4/25, runs of atrial fibrillation were seen by device interrogation and apixaban  was started.  - hard to assess current underlying rhythm with pacing and tachycardia - was on NSR on admit  - Restart AC as able   7. AKI on CKD: Stage III - Scr 1.5 at baseline - Cr 2.4>2.01>2.30 today - suspect ATN   8. NSVT - suspect d/t volume overload - BNP trend up, diuresis as above - 16b run overnight x2  Length of Stay: 4  CRITICAL CARE Performed by: [place]  Lee  Total critical care time: 12 minutes  -Critical care time was exclusive of separately billable procedures and treating other patients. -Critical care was necessary to treat or prevent imminent or life-threatening deterioration. -Critical care was time spent personally by me on the following activities: development of treatment plan with patient and/or surrogate as well as nursing, discussions with consultants, evaluation of patient's response to  treatment, examination of patient, obtaining history from patient or surrogate, ordering and performing treatments and interventions, ordering and review of laboratory studies, ordering and review of radiographic studies, pulse oximetry and re-evaluation of patient's condition.  [place] Lee, NP  05/12/2024, 6:59 AM  Advanced Heart Failure Team Pager [phone] (M-F; 7a - 5p)  Please contact [facility] for night-coverage after hours (5p -7a ) and weekends on [email or web address]  Agree with above.   He remains on VP and low dose NE. More alert and less tachycardic. Denies CP or SOB. Had brief NSVT this am.   ECG underlying rhythm looks like AF  General:  Sitting up in bed No resp difficulty HEENT: normal Neck: supple.JVP 10  Large cyst on left neck Cor: PMI nondisplaced. Regular rate & rhythm. +s3 Lungs: clear Abdomen: soft, nontender, nondistended. No hepatosplenomegaly. No bruits or masses. Good bowel sounds. Extremities: no cyanosis, clubbing, rash, edema Neuro: alert & orientedx3, cranial nerves grossly intact. moves all 4 extremities w/o difficulty. Affect pleasant + HOH  Septic shock resolving. Wean NE and VP as tolerated. Agree with IV diuresis today  Watch closely for worsening HF.  Keep K > 4.0 Mg > 4.0 for VT.   Looks like recurrent AF underlying CRT. Will need device interrogation to see duration.   CRITICAL CARE Performed by: Cherrie Sieving  Total critical care time: 45 minutes  Critical care time was exclusive of separately  billable procedures and treating other patients.  Critical care was necessary to treat or prevent imminent or life-threatening deterioration.  Critical care was time spent personally by me (independent of midlevel providers or residents) on the following activities: development of treatment plan with patient and/or surrogate as well as nursing, discussions with consultants, evaluation of patient's response to treatment, examination of patient, obtaining history from patient or surrogate, ordering and performing treatments and interventions, ordering and review of laboratory studies, ordering and review of radiographic studies, pulse oximetry and re-evaluation of patient's condition.  Sieving Cherrie, MD  12:15 PM

## 2024-05-12 NOTE — TOC Initial Note (Signed)
 Transition of Care [facility]) - Initial/Assessment Note    Patient Details  Name: Christian Bowman MRN: [id number] Date of Birth: January 10, 1946  [facility] CM/SW Contact:    [name], RN Phone Number: [phone] 05/12/2024, 2:35 PM  Clinical Narrative:                 Spoke to pt's wife at bedside. Pt was independent pta. Has cane at home.  Wife will assist with care at home.    Chart reviewed for discharge readiness, patient not medically stable for d/c. Inpatient CM/CSW will continue to monitor pt's advancement through interdisciplinary progression rounds. If new pt transition needs arise, MD please place a TOC consult.    Expected Discharge Plan: IP Rehab Facility Barriers to Discharge: Continued Medical Work up   Patient Goals and CMS Choice            Expected Discharge Plan and Services   Discharge Planning Services: CM Consult   Living arrangements for the past 2 months: Single Family Home                                      Prior Living Arrangements/Services Living arrangements for the past 2 months: Single Family Home Lives with:: Spouse                   Activities of Daily Living   ADL Screening (condition at time of admission) Independently performs ADLs?: Yes (appropriate for developmental age) Is the patient deaf or have difficulty hearing?: Yes Does the patient have difficulty seeing, even when wearing glasses/contacts?: Yes Does the patient have difficulty concentrating, remembering, or making decisions?: No  Permission Sought/Granted                  Emotional Assessment   Attitude/Demeanor/Rapport: Lethargic          Admission diagnosis:  Rectal bleeding [K62.5] BRBPR (bright red blood per rectum) [K62.5] Patient Active Problem List   Diagnosis Date Noted   Shock (HCC) 05/10/2024   BRBPR (bright red blood per rectum) 05/06/2024   Hyperkalemia 05/06/2024   Leukopenia due to antineoplastic  chemotherapy (HCC) 05/06/2024   History of CVA (cerebrovascular accident) 05/06/2024   Port-A-Cath in place 04/10/2024   Chronic kidney disease, stage 3a (HCC) 03/21/2024   Thrombocytopenia (HCC) 03/21/2024   Primary cancer of tonsillar fossa (HCC) 03/04/2024   Mass in neck 02/18/2024   Chronic CHF (congestive heart failure) (HCC) 02/18/2024   Chronic health problem 02/18/2024   Hemoptysis 02/18/2024   Complete AV block (HCC) 09/13/2023   Long term current use of oral hypoglycemic drug 09/13/2023   Hypercholesterolemia 10/11/2018   Type 2 diabetes mellitus with stage 3a chronic kidney disease, without long-term current use of insulin  (HCC) 10/04/2017   Angina decubitus (HCC)    Non-ST elevation (NSTEMI) myocardial infarction (HCC)    Inguinal hernia of left side without obstruction or gangrene 08/20/2017   Atrial flutter (HCC) 05/11/2017   Hypothyroidism (acquired) 02/25/2017   Encounter for monitoring amiodarone  therapy 02/25/2017   Elevated troponin 12/15/2016   Chest pain 12/15/2016   Presence of biventricular implantable cardioverter-defibrillator (ICD) 09/11/2016   Protein-calorie malnutrition, severe 07/24/2016   Acute on chronic systolic CHF (congestive heart failure) (HCC)    S/P redo CABG x 2 07/11/2016   Coronary artery disease involving coronary bypass graft    Left main coronary  artery disease    Coronary artery disease of native artery of native heart with stable angina pectoris (HCC)    Vitamin D  deficiency 08/12/2015   Chronic heart failure with reduced ejection fraction (HFrEF, <= 40%) (HCC) 07/28/2015   Mitral regurgitation 07/28/2015   Left renal artery stenosis (HCC) 08/26/2013   Mixed hyperlipidemia 08/26/2013   Type 2 diabetes mellitus with vascular disease (HCC) 08/26/2013   PCP:  Freddrick No Pharmacy:   [facility] [address], [place] - [address] [place] [address] Phone: [phone] Fax: [phone]  [name] [address] [place] [address] Phone: [phone] Fax: [phone]     Social Drivers of Health (SDOH) Social History: SDOH Screenings   Food Insecurity: No Food Insecurity (05/07/2024)  Housing: Low Risk  (05/07/2024)  Transportation Needs: No Transportation Needs (05/07/2024)  Utilities: Not At Risk (05/07/2024)  Depression (PHQ2-9): Medium Risk (05/01/2024)  Social Connections: Moderately Integrated (05/07/2024)  Tobacco Use: Low Risk  (05/06/2024)   SDOH Interventions: Transportation Interventions: Inpatient TOC   Readmission Risk Interventions     No data to display

## 2024-05-12 NOTE — Progress Notes (Signed)
 Nutrition Follow-up  DOCUMENTATION CODES:  Severe malnutrition in context of acute illness/injury  INTERVENTION:  TF via PEG: Osmolite 1.5 goal 49ml/hr ( per day) 60ml ProSource TF20 BID Provides 2320 kcal, 130g protein, free water daily  Add Nutrisource fiber BID as bulking agent.   Recommend and discussed with MD obtaining SLP evaluation to determine whether pt is safe to take any oral intake.    Consider transitioning to bolus tube feeds prior to discharge if aligns with patient medical care goals.   NUTRITION DIAGNOSIS:  Severe Malnutrition related to acute illness (tonsillar cancer) as evidenced by energy intake < or equal to 50% for > or equal to 5 days, severe muscle depletion. - remains applicable  GOAL:  Patient will meet greater than or equal to 90% of their needs - goal met via TF  MONITOR:  Labs, Weight trends, TF tolerance, I & O's  REASON FOR ASSESSMENT:  Consult Enteral/tube feeding initiation and management  ASSESSMENT:  Pt presented with nausea, vomiting, diarrhea and rectal bleeding. 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  Continues with severe HF. Followed by heart failure.  Required dobutamine  and increased pressor support over the weekend.   Spoke with pt at bedside. He is in good spirits today. He is tolerating continuous feeds at goal rate. His wife mentions that he had an episode of nausea yesterday but did not have emesis. He has had difficulty with secretions but feels he is managing them well.  Pt is hopeful to resume an oral diet again as he was taking soft/pureed type foods (ice cream and mashed potatoes) very limited PTA.   Pt is awaiting further conversations with medical providers prior to deciding whether he will continue with radiation therefore wishes to hold off on  discussion about transitioning to bolus feeds.   Pt c/o multiple instances of watery diarrhea, at least 3 times per day. Will add bulking agent to attempt to help with this.   Admit weight: 86 kg (?stated) Current weight: 79.6 kg  Drains/lines: UOP: 1430 ml x24 hours PEG   Medications: lasix  40mg  BID, SSI 0-20 units q4h, novolog  6 units q4h, semglee  20 units daily Drips: Levo @ 2 mcg/min Abx x1 Vaso @ 0.04 units/min  Labs:  BUN 58 Cr 2.30 GFR 29 CBG's 129-236 x24 hours  Diet Order:   Diet Order             Diet NPO time specified  Diet effective now                   EDUCATION NEEDS:   Education needs have been addressed  Skin:  Skin Assessment: Skin Integrity Issues: Skin Integrity Issues:: Stage I Stage I: mid sacrum  Last BM:  8/10 x2 (type 6 and type 7)  Height:   Ht Readings from Last 1 Encounters:  05/06/24 6' (1.829 m)    Weight:   Wt Readings from Last 1 Encounters:  05/12/24 79.6 kg   BMI:  Body mass index is 23.8 kg/m.  Estimated Nutritional Needs:   Kcal:  2300-2500  Protein:  120-135g  Fluid:  >/=2L  Christian Bowman, RDN, LDN Clinical Nutrition See AMiON for contact information.

## 2024-05-12 NOTE — Progress Notes (Signed)
 Discussed the possibility of resuming radiation treatment for his tonsillar cancer-Will need transferred to [facility] for his treatments  Patient does not want to move forward with this at present, states he wants to talk with his cardiologist Dr. Francyne before moving forward.  He is followed by heart failure service while inpatient

## 2024-05-12 NOTE — Progress Notes (Signed)
 NAME:  Christian Bowman, MRN:  [id number], DOB:  [birth year], LOS: 4 ADMISSION DATE:  05/06/2024 CHIEF COMPLAINT:  NV diarrhea.    History of Present Illness:  Christian Bowman is a 78 y.o. 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  He presented to [facility] ED on 8/5 with rectal bleeding.   He was admitted by the hospitalist team and was evaluated by GI on 8/6.  Was felt that his rectal bleeding was most likely secondary to hemorrhoids for which medical management was recommended.   Over the course of the day, he had ongoing hypotension despite multiple fluid boluses.  Mental status had remained the same and he was essentially asymptomatic.  He had had 2 BMs prior in the day that was somewhat loose but not overt diarrhea and no true hematochezia.  He had had low-grade fever early in the morning but had resolved after 1 dose of Tylenol .  His Coreg  and Flomax  was stopped, he was given additional gentle IV fluids, albumin  as well as started on midodrine .  H&H remained stable (11/32).  Echocardiogram has been ordered and is pending.  Lactic acid 1.9, Pro-Cal 2.79.   Unfortunately later in the afternoon, he remained hypotensive (systolics in the 80s) and PCCM was subsequently called to see in consultation. Hospital course   8/6 admitted, seen by GI 8/7 pccm asked to see for shock 8/8: Chest x-ray: Worsening right middle lung opacity could be due to pulmonary congestion versus pneumonia.POCUS done: Some B-lines more on the left.  No consolidation seen on lung POCUS.  Cardiac POCUS showing severely reduced EF.  IVC around 2.5 cm with without  much respiratory variation. 8/9 added Epi. Was already on fairly high dose norepi. No sig decrease on Norepi needs. Scr rising a little w/ on-going lasix . Changed to dobut in early evening. Pressors needs increased after I added dobut. Co-ox actually down. Renal fxn about the same.  8/10: Resting currently.  Got confused with Ambien  last night.  Pressor requirement starting to head back down since we have stopped the dobutamine  Interim History / Subjective:  Did not sleep well last night Denies any pain or discomfort  Objective    Blood pressure (!) 99/55, pulse 60, temperature 98 F (36.7 C), temperature source Oral, resp. rate 19, height 6' (1.829 m), weight 79.6 kg, SpO2 94%.        Intake/Output Summary (Last 24 hours) at 05/12/2024 0733 Last data filed at 05/12/2024 0600 Gross per 24 hour  Intake 2182.61 ml  Output 1610 ml  Net 572.61 ml   Filed Weights   05/10/24 0702 05/11/24 0706 05/12/24 0500  Weight: 83.5 kg 79.9 kg 79.6 kg   Examination: General Pleasant, on nasal cannula chronically ill-appearing HEENT: Normocephalic, atraumatic, moist mucous membranes, no adenopathy , swelling right side of neck Pulmonary: Decreased air movement bilaterally, clear Cardiac: Irregularly irregular pulse, S1-S2 appreciated Abdomen soft, bowel sounds appreciated Extremities: Skin is warm and dry Neuro: Awake alert interactive GU clear yellow.   I reviewed last 24 h vitals and pain scores, last 48 h intake and output, last 24 h labs and trends, and last 24 h  imaging results.  Resolved problem list   Septic shock   Assessment and Plan  22 male with recently diagnosed tonsillar cancer on chemoradiation last chemotherapy last Thursday that is 7/31.  Neutropenia improved with GSF.  Also has history of CHF with EF less than 20% with CRT-D, A-fib on Eliquis  at home, CAD, bioprosthetic MVR, CKD, DM 2 and hypothyroidism.  Mixed picture shock Concern for sepsis, cardiogenic shock.  Ejection  fraction of 20% with global hypokinesis - Was on dobutamine  which had to be stopped -Remains on antibiotics-day 6 of 7 of Zosyn  -Not a candidate for GDMT -Appreciate heart failure team follow-up - Remains on vasopressin  and levo Last BNP of 4153 Co-ox 63%  Atrial fibrillation -Rate is controlled -Anticoagulation on hold - Platelets 77,000 - Will plan to resume anticoagulation again once platelets above 100,000 - Anticoagulation being held because of bright red rectal bleed related to hemorrhoids  Acute kidney injury on chronic kidney disease -Renal dose medications - Maintain renal perfusion  Urinary retention - Foley in place  Pancytopenia - Received filgrastim  in 8/7 - White count has improved  Type 2 diabetes - On SSI - Goal sugars of 140-180  Hypothyroidism - Continue Synthroid   Tonsillar cancer on chemoradiation - PEG tube in place for feeding - Pain management with Oxy  Oral thrush - On p.o. Diflucan   Had bright red blood per rectum Felt related to hemorrhoids - On topical Anusol  - Stool softeners  Tolerating ice chips    Best Practice (right click and Reselect all SmartList Selections daily)   Diet/type: tubefeeds DVT prophylaxis SCD Pressure ulcer(s): N/A GI prophylaxis: PPI.  Lines: yes and it is still needed Foley:  Yes, and it is still needed Code Status:  full code Last date of multidisciplinary goals of care discussion [wife at bedside and regularly being updated about the plan updated 05/12/2024.  All questions answered.  The patient is critically ill with multiple organ systems failure and requires high complexity decision making for assessment and support, frequent evaluation and titration of therapies, application of advanced monitoring technologies and extensive interpretation of multiple databases. Critical Care Time devoted to patient care services described in this note independent of APP/resident time (if applicable)  is 33 minutes.    Jennet Epley MD [facility] Personal pager: See Amion If unanswered, please page CCM On-call: #[phone]

## 2024-05-12 NOTE — Inpatient Diabetes Management (Signed)
 Inpatient Diabetes Program Recommendations  AACE/ADA: New Consensus Statement on Inpatient Glycemic Control (2015)  Target Ranges:  Prepandial:   less than 140 mg/dL      Peak postprandial:   less than 180 mg/dL (1-2 hours)      Critically ill patients:  140 - 180 mg/dL   Lab Results  Component Value Date   GLUCAP 230 (H) 05/12/2024   HGBA1C 6.6 (H) 02/19/2024    Review of Glycemic Control  Latest Reference Range & Units 05/12/24 03:05 05/12/24 07:07 05/12/24 11:12  Glucose-Capillary 70 - 99 mg/dL 763 (H) 786 (H) 769 (H)  (H): Data is abnormally high  Diabetes history: DM2 Outpatient Diabetes medications: Farxiga  10 mg every day, Glipizide  5 every day, Decadron  8 mg PRN chemo Current orders for Inpatient glycemic control: Semglee  20 units every day, Novolog  0-20 units Q4H, Novolog  6 units Q4H, Osmolite @ 60 ml/hr  Inpatient Diabetes Program Recommendations:    Please consider:  Novolog  8 units Q4H tube feed coverage.  Thank you, Wyvonna Pinal, MSN, CDCES Diabetes Coordinator Inpatient Diabetes Program [phone] (team pager from 8a-5p)

## 2024-05-12 NOTE — Evaluation (Signed)
 Clinical/Bedside Swallow Evaluation Patient Details  Name: CLIVE PARCEL MRN: [id number] Date of Birth: [birth year]  Today's Date: 05/12/2024 Time: SLP Start Time (ACUTE ONLY): 1511 SLP Stop Time (ACUTE ONLY): 1553 SLP Time Calculation (min) (ACUTE ONLY): 42 min  Past Medical History:  Past Medical History:  Diagnosis Date   AICD (automatic cardioverter/defibrillator) present    medtronic-   DR. CROITORU , DR. ROLAN    Anginal pain (HCC)    cp sat 08/11/17   Anxiety    CAD (coronary artery disease)    CHF (congestive heart failure) (HCC)    Complication of anesthesia    took awhile to wake up    Coronary artery disease involving coronary bypass graft    Cyst of neck    right side   DM2 (diabetes mellitus, type 2) (HCC) 08/26/2013   Dyspnea    Heart attack (HCC)    not sure when (08/20/2017)   HTN (hypertension) 08/26/2013   Hyperlipidemia 08/26/2013   Hypothyroidism    Left main coronary artery disease    Left renal artery stenosis (HCC)    Genesis 6x12 stent 2007   Localized cancer of throat (HCC)    Obesity (BMI 30.0-34.9) 08/26/2013   Postoperative atrial fibrillation (HCC) 10/15/2005   Presence of permanent cardiac pacemaker    S/P CABG x 4 10/13/2005   LIMA to LAD, SVG to intermediate branch, sequential SVG to PDA and RPL branch, EVH via right thigh   S/P mitral valve replacement with bioprosthetic valve 07/11/2016   31 mm [facility] Mitral bovine bioprosthetic tissue valve   S/P redo CABG x 2 07/11/2016   SVG to PDA and SVG to Intermediate Branch, EVH via left thigh   Past Surgical History:  Past Surgical History:  Procedure Laterality Date   A-FLUTTER ABLATION N/A 05/11/2017   Procedure: A-Flutter Ablation;  Surgeon: Inocencio Soyla Lunger, MD;  Location: [facility] INVASIVE CV LAB;  Service: Cardiovascular;  Laterality: N/A;   BIV ICD GENERATOR CHANGEOUT N/A 08/29/2023   Procedure: BIV ICD GENERATOR CHANGEOUT;  Surgeon: Inocencio Soyla Lunger, MD;  Location: [facility] INVASIVE  CV LAB;  Service: Cardiovascular;  Laterality: N/A;   CARDIAC CATHETERIZATION N/A 06/21/2016   Procedure: Right/Left Heart Cath and Coronary/Graft Angiography;  Surgeon: Ozell Fell, MD;  Location: [facility] INVASIVE CV LAB;  Service: Cardiovascular;  Laterality: N/A;   CARDIAC VALVE REPLACEMENT     CARDIOVERSION N/A 07/19/2016   Procedure: CARDIOVERSION;  Surgeon: Redell GORMAN Shallow, MD;  Location: [facility] ENDOSCOPY;  Service: Cardiovascular;  Laterality: N/A;   CARDIOVERSION N/A 09/08/2016   Procedure: CARDIOVERSION;  Surgeon: Vina LULLA Gull, MD;  Location: [facility] ENDOSCOPY;  Service: Cardiovascular;  Laterality: N/A;   CORONARY ANGIOPLASTY     STENT 2016  [facility]   CORONARY ANGIOPLASTY WITH STENT PLACEMENT     DES in SVG to right coronary artery system   CORONARY ARTERY BYPASS GRAFT  10/13/2005   LIMA to LAD, SVG to intermediate branch, sequential SVG to PDA and RPL   CORONARY ARTERY BYPASS GRAFT N/A 07/11/2016   Procedure: REDO CORONARY ARTERY BYPASS GRAFTING (CABG) x two using left leg greater saphenous vein harvested endoscopically-SVG to PDA -SVG to RAMUS INTERMEDIATE;  Surgeon: Sudie VEAR Laine, MD;  Location: [facility] OR;  Service: Open Heart Surgery;  Laterality: N/A;   CORONARY ARTERY BYPASS GRAFT N/A 07/11/2016   Procedure: Re-exploration (CABG) for post op bleeding,;  Surgeon: Sudie VEAR Laine, MD;  Location: [facility] OR;  Service: Open Heart Surgery;  Laterality: N/A;  EP IMPLANTABLE DEVICE N/A 07/25/2016   Procedure: BiV ICD Insertion CRT-D;  Surgeon: Will Gladis Norton, MD;  Location: [facility] INVASIVE CV LAB;  Service: Cardiovascular;  Laterality: N/A;   INGUINAL HERNIA REPAIR Left 08/20/2017   INGUINAL HERNIA REPAIR Left 08/20/2017   Procedure: OPEN LEFT INGUINAL HERNIA REPAIR;  Surgeon: Ethyl Lenis, MD;  Location[facility] OR;  Service: General;  Laterality: Left;   IR GASTROSTOMY TUBE MOD SED  04/09/2024   IR IMAGING GUIDED PORT INSERTION  04/09/2024   LEFT HEART CATH AND CORS/GRAFTS ANGIOGRAPHY N/A  12/18/2016   Procedure: Left Heart Cath and Cors/Grafts Angiography;  Surgeon: Ozell Fell, MD;  Location: [facility] INVASIVE CV LAB;  Service: Cardiovascular;  Laterality: N/A;   LEFT HEART CATH AND CORS/GRAFTS ANGIOGRAPHY N/A 08/22/2017   Procedure: LEFT HEART CATH AND CORS/GRAFTS ANGIOGRAPHY;  Surgeon: Rolan Ezra RAMAN, MD;  Location: [facility] INVASIVE CV LAB;  Service: Cardiovascular;  Laterality: N/A;   MITRAL VALVE REPLACEMENT N/A 07/11/2016   Procedure: MITRAL VALVE (MV) REPLACEMENT;  Surgeon: Sudie VEAR Laine, MD;  Location: [facility] OR;  Service: Open Heart Surgery;  Laterality: N/A;   MYOCARDICAL PERFUSION  10/09/2007   NORMAL PERFUSION IN ALL REGIONS;NO EVIDENCE OF INDUCIBLE ISCHEMIA;POST STRESS EF% 66   RENAL ARTERY STENT Right 2007   RENAL DOPPLER  03/28/2010   RIGHT RA-NORMAL;LEFT PROXIMAL RA AT STENT-PATENT WITH NO EVIDENCE OF SIGN DIAMETER REDUCTION. R & L KIDNEYS: EQUAL IN SIZE,SYMMETRICAL IN SHAPE.   RIGHT HEART CATH N/A 01/08/2020   Procedure: RIGHT HEART CATH;  Surgeon: Rolan Ezra RAMAN, MD;  Location: [facility] INVASIVE CV LAB;  Service: Cardiovascular;  Laterality: N/A;   TEE WITHOUT CARDIOVERSION N/A 06/15/2016   Procedure: TRANSESOPHAGEAL ECHOCARDIOGRAM (TEE);  Surgeon: Jerel Balding, MD;  Location: [facility] ENDOSCOPY;  Service: Cardiovascular;  Laterality: N/A;   TEE WITHOUT CARDIOVERSION N/A 07/11/2016   Procedure: TRANSESOPHAGEAL ECHOCARDIOGRAM (TEE);  Surgeon: Sudie VEAR Laine, MD;  Location: [facility] OR;  Service: Open Heart Surgery;  Laterality: N/A;   TEE WITHOUT CARDIOVERSION N/A 07/19/2016   Procedure: TRANSESOPHAGEAL ECHOCARDIOGRAM (TEE);  Surgeon: Redell RAMAN Shallow, MD;  Location: [facility] ENDOSCOPY;  Service: Cardiovascular;  Laterality: N/A;   TRANSESOPHAGEAL ECHOCARDIOGRAM  10/19/2005   NORMAL LV; MILD TO MODERATE AMOUNT OF SOFT ATHEROMATOUS PLAQUE OF THE THORACIC AORTA; THE LEFT ATRIUM IS MILDLY DILATED;LEFT ATRIAL APPENDAGE FUNCTION IS NORMAL;NO THROMBUS IDENTIFIED. 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 GI evaluated 8/6, felt to be related to hemorrhoids. Developed hypotension despite multiple fluid boluses and a low-grade fever, w/u ongoing for septic shock. CXR 8/7 shows streaky bibasilar opacities is favored to reflect pulmonary vascular congestion with suspected interstitial edema, improving on CXR 8/9. Laryngoscopy 02/18/24 shows evidence of large exophytic mass involving the R tonsil and soft palate but without any obvious involvement of the BOT. PET scan 02/28/24 reveals a hypermetabolic mass centered in the R palantine tonsil pharyngeal region extending to the glossal tonsillar sulcus and the soft palate. Seen by OP SLP 04/03/24 without overt s/s of aspiration throughout a meal. Underwent G-tube placement 04/09/24.    Assessment / Plan / Recommendation  Clinical Impression  Pt reports decreased oral intake since starting chemo and XRT, primarily depending on G-tube for nutrition. Nasal emission is noted during spontaneous speech and he describes expectorating  copious secretions. He states he finds pleasure in eating/drinking and wants to continue to have POs intermittently. Coughing followed straw sips of water but pt reported it was at least partially due to its cold temperature. Cup sips of room temperature water were observed without recurrent coughing. He fed himself nearly an entire cup of applesauce without overt s/s of dysphagia or any further coughing. Discussed options for diet modification and for further testing, which pt declines at this time. Education was provided regarding aspiration precautions and frequent oral care. Recommend allowing POs from floorstock after oral care, which resembles his diet PTA. Will continue following acutely with recommendations to f/u with SLP on an OP  basis pending decisions re: continuing chemo and XRT. SLP Visit Diagnosis: Dysphagia, unspecified (R13.10)    Aspiration Risk  Moderate aspiration risk    Diet Recommendation NPO (x floorstock snacks)    Medication Administration: Via alternative means Postural Changes: Remain upright for at least 30 minutes after po intake    Other  Recommendations Oral Care Recommendations: Oral care QID;Oral care before and after PO     Assistance Recommended at Discharge    Functional Status Assessment Patient has had a recent decline in their functional status and demonstrates the ability to make significant improvements in function in a reasonable and predictable amount of time.  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 GI evaluated 8/6, felt to be related to hemorrhoids. Developed hypotension despite multiple fluid boluses and a low-grade fever, w/u ongoing for septic shock. CXR 8/7 shows streaky bibasilar opacities is favored to reflect pulmonary vascular congestion with suspected interstitial edema, improving on CXR 8/9. Laryngoscopy 02/18/24 shows evidence of large exophytic mass involving the R tonsil and soft palate but without any obvious involvement of the BOT. PET scan 02/28/24 reveals a hypermetabolic mass centered in the R palantine tonsil pharyngeal region extending to the glossal tonsillar sulcus and the soft palate. Seen by OP SLP 04/03/24 without overt s/s of aspiration throughout a meal. Underwent G-tube placement 04/09/24. Type of Study: Bedside Swallow Evaluation Previous Swallow Assessment: see HPI Diet Prior to this Study:  NPO;G-tube Temperature Spikes Noted: No Respiratory Status: Room air History of Recent Intubation: No Behavior/Cognition: Alert;Cooperative Oral Cavity Assessment: Within Functional Limits Oral Care Completed by SLP: No Oral Cavity - Dentition: Adequate natural dentition Vision: Functional for self-feeding Self-Feeding Abilities: Able to feed self Patient Positioning: Upright in bed Baseline Vocal Quality: Normal Volitional Cough: Strong Volitional Swallow: Able to elicit    Oral/Motor/Sensory Function Overall Oral Motor/Sensory Function: Within functional limits   Ice Chips Ice chips: Not tested   Thin Liquid Thin Liquid: Impaired Presentation: Straw;Cup;Self Fed Pharyngeal  Phase Impairments: Suspected delayed Swallow;Multiple swallows;Cough - Immediate    Nectar Thick Nectar Thick Liquid: Not tested   Honey Thick Honey Thick Liquid: Not tested   Puree Puree: Within functional limits Presentation: Spoon;Self Fed   Solid     Solid: Not tested      Damien Blumenthal, M.A., CCC-SLP Speech Language Pathology, Acute Rehabilitation Services  Secure Chat preferred [phone]  05/12/2024,4:41 PM

## 2024-05-13 ENCOUNTER — Ambulatory Visit

## 2024-05-13 ENCOUNTER — Encounter

## 2024-05-13 DIAGNOSIS — K625 Hemorrhage of anus and rectum: Secondary | ICD-10-CM | POA: Diagnosis not present

## 2024-05-13 DIAGNOSIS — N179 Acute kidney failure, unspecified: Secondary | ICD-10-CM | POA: Diagnosis not present

## 2024-05-13 DIAGNOSIS — I4891 Unspecified atrial fibrillation: Secondary | ICD-10-CM | POA: Diagnosis not present

## 2024-05-13 DIAGNOSIS — B370 Candidal stomatitis: Secondary | ICD-10-CM

## 2024-05-13 DIAGNOSIS — R579 Shock, unspecified: Secondary | ICD-10-CM | POA: Diagnosis not present

## 2024-05-13 DIAGNOSIS — I5023 Acute on chronic systolic (congestive) heart failure: Secondary | ICD-10-CM | POA: Diagnosis not present

## 2024-05-13 DIAGNOSIS — C090 Malignant neoplasm of tonsillar fossa: Secondary | ICD-10-CM | POA: Diagnosis not present

## 2024-05-13 LAB — CBC
HCT: 29.8 % — ABNORMAL LOW (ref 39.0–52.0)
Hemoglobin: 9.9 g/dL — ABNORMAL LOW (ref 13.0–17.0)
MCH: 30.9 pg (ref 26.0–34.0)
MCHC: 33.2 g/dL (ref 30.0–36.0)
MCV: 93.1 fL (ref 80.0–100.0)
Platelets: 89 K/uL — ABNORMAL LOW (ref 150–400)
RBC: 3.2 MIL/uL — ABNORMAL LOW (ref 4.22–5.81)
RDW: 13.3 % (ref 11.5–15.5)
WBC: 13.6 K/uL — ABNORMAL HIGH (ref 4.0–10.5)
nRBC: 0 % (ref 0.0–0.2)

## 2024-05-13 LAB — BASIC METABOLIC PANEL WITH GFR
Anion gap: 13 (ref 5–15)
BUN: 68 mg/dL — ABNORMAL HIGH (ref 8–23)
CO2: 27 mmol/L (ref 22–32)
Calcium: 8.8 mg/dL — ABNORMAL LOW (ref 8.9–10.3)
Chloride: 101 mmol/L (ref 98–111)
Creatinine, Ser: 2.25 mg/dL — ABNORMAL HIGH (ref 0.61–1.24)
GFR, Estimated: 29 mL/min — ABNORMAL LOW (ref >60)
Glucose, Bld: 223 mg/dL — ABNORMAL HIGH (ref 70–99)
Potassium: 3.2 mmol/L — ABNORMAL LOW (ref 3.5–5.1)
Sodium: 141 mmol/L (ref 135–145)

## 2024-05-13 LAB — TROPONIN I (HIGH SENSITIVITY)
Troponin I (High Sensitivity): 99 ng/L — ABNORMAL HIGH (ref <18)
Troponin I (High Sensitivity): 132 ng/L (ref <18)

## 2024-05-13 LAB — HEPARIN LEVEL (UNFRACTIONATED): Heparin Unfractionated: 0.27 [IU]/mL — ABNORMAL LOW (ref 0.30–0.70)

## 2024-05-13 LAB — GLUCOSE, CAPILLARY
Glucose-Capillary: 189 mg/dL — ABNORMAL HIGH (ref 70–99)
Glucose-Capillary: 192 mg/dL — ABNORMAL HIGH (ref 70–99)
Glucose-Capillary: 161 mg/dL — ABNORMAL HIGH (ref 70–99)
Glucose-Capillary: 169 mg/dL — ABNORMAL HIGH (ref 70–99)
Glucose-Capillary: 243 mg/dL — ABNORMAL HIGH (ref 70–99)
Glucose-Capillary: 76 mg/dL (ref 70–99)

## 2024-05-13 LAB — APTT: aPTT: 58 s — ABNORMAL HIGH (ref 24–36)

## 2024-05-13 MED ORDER — FUROSEMIDE 10 MG/ML IJ SOLN
80.0000 mg | Freq: Once | INTRAMUSCULAR | Status: AC
  Administered 2024-05-13 (×2): 80 mg via INTRAVENOUS
  Filled 2024-05-13: qty 8

## 2024-05-13 MED ORDER — POTASSIUM CHLORIDE 20 MEQ PO PACK
40.0000 meq | PACK | Freq: Once | ORAL | Status: AC
  Administered 2024-05-13 (×2): 40 meq
  Filled 2024-05-13: qty 2

## 2024-05-13 MED ORDER — HEPARIN (PORCINE) 25000 UT/250ML-% IV SOLN
1300.0000 [IU]/h | INTRAVENOUS | Status: DC
  Administered 2024-05-13 (×2): 1100 [IU]/h via INTRAVENOUS
  Filled 2024-05-13 (×2): qty 250

## 2024-05-13 MED ORDER — TORSEMIDE 20 MG PO TABS: 40.0000 mg | ORAL_TABLET | Freq: Every day | ORAL | Status: DC

## 2024-05-13 MED ORDER — POTASSIUM CHLORIDE 20 MEQ PO PACK
40.0000 meq | PACK | Freq: Once | ORAL | Status: AC
  Administered 2024-05-13 (×2): 40 meq

## 2024-05-13 MED ORDER — MELATONIN 3 MG PO TABS
3.0000 mg | ORAL_TABLET | Freq: Every day | ORAL | Status: DC
  Administered 2024-05-13 – 2024-05-15 (×5): 3 mg via ORAL
  Filled 2024-05-13 (×3): qty 1

## 2024-05-13 MED ORDER — POTASSIUM CHLORIDE CRYS ER 20 MEQ PO TBCR: 40.0000 meq | EXTENDED_RELEASE_TABLET | Freq: Once | ORAL | Status: DC

## 2024-05-13 MED ORDER — POTASSIUM CHLORIDE 20 MEQ PO PACK
40.0000 meq | PACK | Freq: Once | ORAL | Status: DC
  Filled 2024-05-13: qty 2

## 2024-05-13 NOTE — Addendum Note (Signed)
 Encounter addended by: Janice Lynwood BROCKS on: 05/13/2024 10:28 AM  Actions taken: Imaging Exam ended

## 2024-05-13 NOTE — Progress Notes (Signed)
 Troponin was obtained in the context of patient having some chest discomfort which was in the context of anxiety, Chest discomfort resolved very quickly without medications  Will monitor closely

## 2024-05-13 NOTE — Progress Notes (Signed)
 PHARMACY - ANTICOAGULATION CONSULT NOTE  Pharmacy Consult for IV heparin  Indication: atrial fibrillation  Allergies  Allergen Reactions   Xanax [Alprazolam] Other (See Comments)    Pt feels very weak, tired and feels paralyzed     Ambien  [Zolpidem ] Other (See Comments)    Delirium    Patient Measurements: Height: 6' (182.9 cm) Weight: 79.8 kg (175 lb 14.8 oz) IBW/kg (Calculated) : 77.6 HEPARIN  DW (KG): 79.8  Vital Signs: Temp: 98.4 F (36.9 C) (08/12 1134) Temp Source: Oral (08/12 1134) BP: 102/61 (08/12 1200) Pulse Rate: 89 (08/12 1200)  Labs: Recent Labs    05/11/24 0210 05/11/24 1743 05/12/24 0235 05/12/24 2058 05/13/24 0549 05/13/24 0953  HGB 12.0*  --  10.6*  --  9.9*  --   HCT 35.5*  --  30.9*  --  29.8*  --   PLT 74*  --  77*  --  89*  --   CREATININE 2.48*   < > 2.30* 2.31* 2.25*  --   TROPONINIHS  --   --   --   --   --  99*   < > = values in this interval not displayed.   Estimated Creatinine Clearance: 30.7 mL/min (A) (by C-G formula based on SCr of 2.25 mg/dL (H)).  Medical History: Past Medical History:  Diagnosis Date   AICD (automatic cardioverter/defibrillator) present    medtronic-   DR. CROITORU , DR. ROLAN    Anginal pain (HCC)    cp sat 08/11/17   Anxiety    CAD (coronary artery disease)    CHF (congestive heart failure) (HCC)    Complication of anesthesia    took awhile to wake up    Coronary artery disease involving coronary bypass graft    Cyst of neck    right side   DM2 (diabetes mellitus, type 2) (HCC) 08/26/2013   Dyspnea    Heart attack (HCC)    not sure when (08/20/2017)   HTN (hypertension) 08/26/2013   Hyperlipidemia 08/26/2013   Hypothyroidism    Left main coronary artery disease    Left renal artery stenosis (HCC)    Genesis 6x12 stent 2007   Localized cancer of throat (HCC)    Obesity (BMI 30.0-34.9) 08/26/2013   Postoperative atrial fibrillation (HCC) 10/15/2005   Presence of permanent cardiac pacemaker     S/P CABG x 4 10/13/2005   LIMA to LAD, SVG to intermediate branch, sequential SVG to PDA and RPL branch, EVH via right thigh   S/P mitral valve replacement with bioprosthetic valve 07/11/2016   31 mm [facility] Mitral bovine bioprosthetic tissue valve   S/P redo CABG x 2 07/11/2016   SVG to PDA and SVG to Intermediate Branch, EVH via left thigh    Medications:  Scheduled:   Chlorhexidine  Gluconate Cloth  6 each Topical Daily   feeding supplement (PROSource TF20)  60 mL Per Tube BID   fiber  1 packet Per Tube BID   fluconazole   100 mg Per Tube Daily   hydrocortisone    Rectal BID   insulin  aspart  0-20 Units Subcutaneous Q4H   insulin  aspart  6 Units Subcutaneous Q4H   insulin  glargine-yfgn  20 Units Subcutaneous Daily   levothyroxine   50 mcg Per Tube QAC breakfast   melatonin  3 mg Oral QHS   pantoprazole  (PROTONIX ) IV  40 mg Intravenous Q24H   potassium chloride   40 mEq Per Tube Once   rosuvastatin   10 mg Per Tube Daily   sodium  chloride flush  10-40 mL Intracatheter Q12H   sodium chloride  flush  3 mL Intravenous Q12H   Infusions:   feeding supplement (OSMOLITE 1.5 CAL) 1,000 mL (05/13/24 1247)   heparin      norepinephrine  (LEVOPHED ) Adult infusion 2 mcg/min (05/13/24 1200)   piperacillin -tazobactam (ZOSYN )  IV 12.5 mL/hr at 05/13/24 1200   vasopressin  Stopped (05/13/24 0455)   Assessment: Patient is a 78 YO M who initially presented with rectal bleeding and was admitted to the ICU for septic shock/febrile neutropenia with cardiogenic component. Patient has afib and was on Eliquis  PTA. Anticoagulation had been held in setting of initial rectal bleeding and thrombocytopenia. Platelets now improved to 89. Pharmacy has been consulted to dose heparin  for afib.   Given elevated risk for bleeding, will target lower end of therapeutic range for heparin  level and will not give bolus dose. Last dose of Eliquis  was 8/4; however, will check aPTT with first heparin  level to verify  correlation given impaired renal function may impact clearance of Eliquis .  Goal of Therapy:  Heparin  level 0.3-0.5 Monitor platelets by anticoagulation protocol: Yes   Plan:  Start heparin  infusion at 1100 units/h.  Check an initial aPTT and heparin  level in 8h.  Monitor heparin  level, CBC, and s/sx bleeding daily.   Maurilio Patten, PharmD PGY1 Pharmacy Resident [facility] 05/13/2024 1:09 PM

## 2024-05-13 NOTE — Progress Notes (Signed)
 ICM remote transmission canceled for 8/12 due to patient is currently hospitalized.   Next ICM remote transmission scheduled for 8/19.

## 2024-05-13 NOTE — Progress Notes (Signed)
 NAME:  Christian Bowman, MRN:  [id number], DOB:  [birth year], LOS: 5 ADMISSION DATE:  05/06/2024 CHIEF COMPLAINT:  NV diarrhea.    History of Present Illness:  Christian Bowman is a 78 y.o. 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  He presented to [facility] ED on 8/5 with rectal bleeding.   He was admitted by the hospitalist team and was evaluated by GI on 8/6.  Was felt that his rectal bleeding was most likely secondary to hemorrhoids for which medical management was recommended.   Over the course of the day, he had ongoing hypotension despite multiple fluid boluses.  Mental status had remained the same and he was essentially asymptomatic.  He had had 2 BMs prior in the day that was somewhat loose but not overt diarrhea and no true hematochezia.  He had had low-grade fever early in the morning but had resolved after 1 dose of Tylenol .  His Coreg  and Flomax  was stopped, he was given additional gentle IV fluids, albumin  as well as started on midodrine .  H&H remained stable (11/32).  Echocardiogram has been ordered and is pending.  Lactic acid 1.9, Pro-Cal 2.79.   Unfortunately later in the afternoon, he remained hypotensive (systolics in the 80s) and PCCM was subsequently called to see in consultation. Hospital course   8/6 admitted, seen by GI 8/7 pccm asked to see for shock 8/8: Chest x-ray: Worsening right middle lung opacity could be due to pulmonary congestion versus pneumonia.POCUS done: Some B-lines more on the left.  No consolidation seen on lung POCUS.  Cardiac POCUS showing severely reduced EF.  IVC around 2.5 cm with without  much respiratory variation. 8/9 added Epi. Was already on fairly high dose norepi. No sig decrease on Norepi needs. Scr rising a little w/ on-going lasix . Changed to dobut in early evening. Pressors needs increased after I added dobut. Co-ox actually down. Renal fxn about the same.  8/10: Resting currently.  Got confused with Ambien  last night.  Pressor requirement starting to head back down since we have stopped the dobutamine   Interim History / Subjective:  Did not sleep well last night Did not tolerate Ambien  well on 8/9  Objective    Blood pressure 91/64, pulse 84, temperature 99 F (37.2 C), temperature source Axillary, resp. rate 16, height 6' (1.829 m), weight 79.6 kg, SpO2 95%.        Intake/Output Summary (Last 24 hours) at 05/13/2024 0733 Last data filed at 05/13/2024 0500 Gross per 24 hour  Intake 1949.54 ml  Output 2650 ml  Net -700.46 ml   Filed Weights   05/10/24 0702 05/11/24 0706 05/12/24 0500  Weight: 83.5 kg 79.9 kg 79.6 kg   Examination: General: Elderly, chronically ill-appearing HEENT: Moist oral mucosa, swelling right side of neck Pulmonary: Clear breath sounds bilaterally, decreased at the bases Cardiac: Irregularly irregular, S1-S2 appreciated Abdomen soft, bowel sounds appreciated Extremities: Skin is warm and dry Neuro: Awake alert interactive GU clear yellow.  I reviewed last 24 h vitals and pain scores, last 48 h intake and output, last 24 h labs and trends, and last 24 h imaging results.  Resolved problem  list   Septic shock   Assessment and Plan  59 male with recently diagnosed tonsillar cancer on chemoradiation last chemotherapy last Thursday that is 7/31.  Neutropenia improved with GSF.  Also has history of CHF with EF less than 20% with CRT-D, A-fib on Eliquis  at home, CAD, bioprosthetic MVR, CKD, DM 2 and hypothyroidism.  Mixed picture shock Concern for sepsis, cardiogenic shock, ejection fraction of 20% with global hypokinesis -  Dobutamine  stopped 8/10 - Today is day 7 of 7 of Zosyn  - Not a candidate for GDMT - Heart failure team continues to follow - On Levophed  at 1 mcg/min, vasopressin  discontinued -Last BNP of 4153 -Received Lasix  x 2 yesterday, switched to torsemide  40 p.o. today  Atrial fibrillation Rate is controlled - Anticoagulation on hold secondary to low platelets -Anticoagulation on hold with platelets below 100,000, plan to resume once platelets recover -Did have bright red rectal bleeding related to hemorrhoids  Acute kidney injury on chronic kidney disease - Maintain renal perfusion - Renal dose medications - BUN/creatinine stable  Urinary retention - Foley in place  Pancytopenia - Received filgrastim  on 8/7 - White count is improved  Type 2 diabetes - On SSI - Goal sugars of 140-180  Hypothyroidism - Continue Synthroid   Tonsillar cancer on chemoradiation - PEG in place for feeding - Pain management with oxy - Holding off on radiation treatments -Conversations ongoing  Oral thrush - On Diflucan   Continue stool softeners  Tolerating ice chips    Best Practice (right click and Reselect all SmartList Selections daily)   Diet/type: tubefeeds DVT prophylaxis SCD Pressure ulcer(s): N/A GI prophylaxis: PPI.  Lines: yes and it is still needed Foley:  Yes, and it is still needed Code Status:  full code Last date of multidisciplinary goals of care discussion [wife at bedside and regularly being updated about the plan updated 05/13/2024.  All questions answered.  The patient is critically ill with multiple organ systems failure and requires high complexity decision making for assessment and support, frequent evaluation and titration of therapies, application of advanced monitoring technologies and extensive interpretation of multiple databases. Critical Care Time devoted to patient care services described in this note independent of APP/resident time (if applicable)  is 33  minutes.   Jennet Epley MD [facility] Personal pager: See Amion If unanswered, please page CCM On-call: #[phone]

## 2024-05-13 NOTE — Progress Notes (Signed)
 I spoke with Christian Bowman's wife by phone this PM - after conferring with Dr. Aleen and Dr. Bensimhon about his inpatient progress. We talked about all the different options regarding his radiation therapy for his throat cancer and are in agreement that at minimum he should rest this week because physically and mentally he does not feel ready to resume radiation. I let her know Christian Bowman will cancel his treatments this week. If he resumes treatment next week I am open to modifying his radiation plan so it is not as aggressive and does not subject him to as many acute effects as he would otherwise receive, but hopefully will give him enough durable control of his cancer so he does not die of a local recurrence.  I let her know that if they decide to take comfort measures only and focus on short term QOL we will support his decision.  My hope is that radiation alone will not be nearly as difficult as ChRT was and that his condition will improve to allow him to resume treatment. This has been a brutal week for them both and she is doing an outstanding job supporting him. -----------------------------------  Christian Golden, MD

## 2024-05-13 NOTE — Progress Notes (Addendum)
 Advanced Heart Failure Rounding Note  HF Cardiologist: Christian Shuck, MD  Chief Complaint: Neutropenic / Septic Shock Patient Profile   Christian Bowman is a 77 y.o. 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   Admitted with septic shock w/ component of cardiogenic shock.   Subjective:    Vaso off early am. Currently on 1 NE.   2.6L UOP last 24 hrs with IV lasix  40 BID.  No dyspnea at rest. Very weak. Reports he hasn't been out of bed in a week. He wants to pause treatment for his tonsillar cancer for now until he gets stronger.  Objective:    Weight Range: 79.6 kg Body mass index is 23.8 kg/m.   Vital Signs:   Temp:  [97.3 F (36.3 C)-99 F (37.2 C)] 99 F (37.2 C) (08/12 0312) Pulse Rate:  [58-120] 84 (08/12 0545) Resp:  [11-41] 16 (08/12 0545) BP: (76-131)/(31-98) 91/64 (08/12 0545) SpO2:  [89 %-96 %] 95 % (08/12 0545) Last BM Date : 05/12/24  Weight change: Filed Weights   05/10/24 0702 05/11/24 0706 05/12/24 0500  Weight: 83.5 kg 79.9 kg 79.6 kg   Intake/Output:  Intake/Output Summary (Last 24 hours) at 05/13/2024 0657 Last data filed at 05/13/2024 0500 Gross per 24 hour  Intake 2025.5 ml  Output 2650 ml  Net -624.5 ml    Physical Exam    General:  Chronically ill appearing. Sitting up in bed.  Neck: Radiation changes to neck. Right neck mass. JVP 8-10 Cor: Rhythm irregular. No murmurs. Lungs: Diminished Abdomen: soft, nontender, nondistended.  Extremities: no edema, legs are warm Neuro: alert & orientedx3. Affect pleasant   Telemetry   VP 90s-100s, underlying AF, runs of NSVT, longest overnight 13  seconds  Labs    CBC Recent Labs    05/11/24 0210 05/12/24 0235  WBC 12.9* 18.5*  HGB 12.0* 10.6*  HCT 35.5* 30.9*  MCV 90.3 91.4  PLT 74* 77*   Basic Metabolic Panel Recent Labs    91/88/74 0235 05/12/24 2058  NA 140 144  K 4.4 3.7  CL 106 104  CO2 25 29  GLUCOSE 262* 123*  BUN 58* 62*  CREATININE 2.30* 2.31*  CALCIUM  8.7* 9.1   Liver Function Tests Recent Labs    05/12/24 0235 05/12/24 2058  AST 26 32  ALT 37 39  ALKPHOS 70 78  BILITOT 1.4* 1.3*  PROT 5.5* 5.6*  ALBUMIN  2.0* 2.1*   BNP (last 3 results) Recent Labs    05/08/24 0424 05/09/24 0401 05/11/24 0813  BNP 3,366.2* 3,731.2* 4,153.6*   Imaging   No results found.  Medications:    Scheduled Medications:  Chlorhexidine  Gluconate Cloth  6 each Topical Daily   feeding supplement (PROSource TF20)  60 mL Per Tube BID   fiber  1 packet Per Tube BID   fluconazole   100 mg Per Tube Daily   hydrocortisone    Rectal BID   insulin  aspart  0-20 Units Subcutaneous Q4H   insulin  aspart  6 Units Subcutaneous Q4H   insulin  glargine-yfgn  20 Units Subcutaneous Daily   levothyroxine   50 mcg Per Tube QAC breakfast   pantoprazole  (PROTONIX ) IV  40 mg  Intravenous Q24H   rosuvastatin   10 mg Per Tube Daily   sodium chloride  flush  10-40 mL Intracatheter Q12H   sodium chloride  flush  3 mL Intravenous Q12H    Infusions:  feeding supplement (OSMOLITE 1.5 CAL) 60 mL/hr at 05/13/24 0500   norepinephrine  (LEVOPHED ) Adult infusion 1 mcg/min (05/13/24 0500)   piperacillin -tazobactam (ZOSYN )  IV 12.5 mL/hr at 05/13/24 0500   vasopressin  Stopped (05/13/24 0455)    PRN Medications: acetaminophen  **OR** acetaminophen , ondansetron  (ZOFRAN ) IV, oxyCODONE , senna-docusate, sodium chloride  flush  Assessment/Plan   1. Neutropenic / Septic Shock  - likely primarily septic physiology with component of cardiogenic shock - PCT 9.19. Bcx x 2 negative. - Afebrile. WBC 1.7>6>12.9>18.5>CBC pending today - ABX per CCM -  pressor requirment significantly improved.  - Off Vaso. Currently on 1 NE. Wean as able. Target SBP ~ 100, MAP goal difficult d/t wide pulse pressure   2. Acute on chronic systolic CHF: Ischemic cardiomyopathy.  [employer] CRT-D.  TEE 10/17 with EF 20-25%.  Echo 3/21 EF 20-25%, nl RV, nl bioprosthetic MVR.  CPX 3/21 severe functional impairment due to HF with concern for poor short-term prognosis.  RHC in 4/21 showed normal filling pressures but low cardiac index. Last echo 9/24 EF < 20% with global hypokinesis. Echo this admission unchanged. He is not a transplant candidate.  He has had 2 prior sternotomies, which makes LVAD more complicated.  We discussed LVAD extensively.  He is very clear that he does not want another cardiac surgery.   - off GDMT with shock  - Wean NE as able - Takes lasix  PRN at home. Remains volume up on exam. OptiVol way up on device. Give 80 mg lasix  X 1. Potentially switch to po Torsemide  20 mg daily tomorrow (PRN lasix  at home). Give 40 mEq K.  - BiV pacing down to 83% with recurrent AF. Would like to get back in SR. See below.   3. Tonsillar CA (HPV +) - currently getting chemo and XRT - Dr. Timmy following. Feels there is strong chance of cure. Patient expresses that he wants to pause treatment for now until he gets stronger.   4. CAD: s/p redo CABG.  Admission in 3/18 with NSTEMI, LHC showed occlusion of SVG-PDA from CABG#1 but patent SVG-PDA from CABG#2, no intervention.  He had a post-op NSTEMI after inguinal hernia repair in 11/18.  LHC showed occlusion of a PLV branch that had been backfilled by SVG-PDA (prior cath had shown severe diffuse disease in the PLV).  - no current s/s angina  - on statin   5. Bioprosthetic mitral valve: stable   6. Atrial flutter/fibrillation: s/p flutter ablation in 8/18.  In 4/25, runs of atrial fibrillation were seen by device interrogation and apixaban  was started.  - Device interrogated. Appears that he went back into AF on  08/07. Decreased BiV pacing to 83%. Ideally get back in SR. Will discuss restarting anticoagulation with CCM. TEE may carry increased risk w/ recent radiation.   7. AKI on CKD: Stage III - Scr 1.5 at baseline - Cr 2.4>2.01>2.30>recheck BMET today - suspect ATN in setting of shock  8. NSVT - suspect d/t advanced heart failure w/ volume overload - BNP trended up - diuresis as above - Keep K > 4 and Mag > 2  Needs to get out of bed. PT/OT consults.  CRITICAL CARE Performed by: COLLETTA SHAVER N   Total critical care time: 14 minutes  Critical care time was exclusive of separately billable  procedures and treating other patients.  Critical care was necessary to treat or prevent imminent or life-threatening deterioration.  Critical care was time spent personally by me on the following activities: development of treatment plan with patient and/or surrogate as well as nursing, discussions with consultants, evaluation of patient's response to treatment, examination of patient, obtaining history from patient or surrogate, ordering and performing treatments and interventions, ordering and review of laboratory studies, ordering and review of radiographic studies, pulse oximetry and re-evaluation of patient's condition.   Length of Stay: 5  FINCH, LINDSAY N, PA-C  05/13/2024, 6:57 AM  Advanced Heart Failure Team Pager [phone] (M-F; 7a - 5p)  Please contact [facility] for night-coverage after hours (5p -7a ) and weekends on [email or web address]  Agree with above.   Remains on low-dose NE. Having trouble weaning to off. Feeling a bit better. But still weak. Scr stable ~2.3  ICD interrogation shows AF since 8/7 with decreased BiV pacing  General:  Weak appearing. No resp difficulty HEENT: normal Neck: supple. Left neck cyst JVP 6-8 Cor Regular rate & rhythm. No rubs, gallops or murmurs. Lungs: clear Abdomen: soft, nontender, nondistended. No hepatosplenomegaly. No bruits or masses. Good bowel  sounds. Extremities: no cyanosis, clubbing, rash, edema Neuro: alert & orientedx3, cranial nerves grossly intact. moves all 4 extremities w/o difficulty. Affect pleasant  He remains NE dependent. Continue to wean as tolerated. BP too soft for GDMT.   He has been in AF since 8/7. Off AC due to low platelets. (Now up to 89k). Given low EF would ideally will get him back in NSR but need to have him on [facility] and get clearance for TEE.   Long talk with him and his wife about whether or not he wants to continue with XRT/chemo. We discussed the pros/cons. They will give me answer tomorrow.   CRITICAL CARE Performed by: Cherrie Sieving  Total critical care time: 55 minutes  Critical care time was exclusive of separately billable procedures and treating other patients.  Critical care was necessary to treat or prevent imminent or life-threatening deterioration.  Critical care was time spent personally by me (independent of midlevel providers or residents) on the following activities: development of treatment plan with patient and/or surrogate as well as nursing, discussions with consultants, evaluation of patient's response to treatment, examination of patient, obtaining history from patient or surrogate, ordering and performing treatments and interventions, ordering and review of laboratory studies, ordering and review of radiographic studies, pulse oximetry and re-evaluation of patient's condition.  Sieving Cherrie, MD  5:11 PM

## 2024-05-13 NOTE — Progress Notes (Signed)
 PHARMACY - ANTICOAGULATION CONSULT NOTE  Pharmacy Consult for IV heparin  Indication: atrial fibrillation  Allergies  Allergen Reactions   Xanax [Alprazolam] Other (See Comments)    Pt feels very weak, tired and feels paralyzed     Ambien  [Zolpidem ] Other (See Comments)    Delirium    Patient Measurements: Height: 6' (182.9 cm) Weight: 79.8 kg (175 lb 14.8 oz) IBW/kg (Calculated) : 77.6 HEPARIN  DW (KG): 79.8  Vital Signs: Temp: 98.3 F (36.8 C) (08/12 2300) Temp Source: Oral (08/12 2300) BP: 94/51 (08/12 2230) Pulse Rate: 99 (08/12 2230)  Labs: Recent Labs    05/11/24 0210 05/11/24 1743 05/12/24 0235 05/12/24 2058 05/13/24 0549 05/13/24 0953 05/13/24 1221 05/13/24 2306  HGB 12.0*  --  10.6*  --  9.9*  --   --   --   HCT 35.5*  --  30.9*  --  29.8*  --   --   --   PLT 74*  --  77*  --  89*  --   --   --   APTT  --   --   --   --   --   --   --  58*  HEPARINUNFRC  --   --   --   --   --   --   --  0.27*  CREATININE 2.48*   < > 2.30* 2.31* 2.25*  --   --   --   TROPONINIHS  --   --   --   --   --  99* 132*  --    < > = values in this interval not displayed.   Estimated Creatinine Clearance: 30.7 mL/min (A) (by C-G formula based on SCr of 2.25 mg/dL (H)).  Assessment: Patient is a 78 YO M who initially presented with rectal bleeding and was admitted to the ICU for septic shock/febrile neutropenia with cardiogenic component. Patient has afib and was on Eliquis  PTA. Anticoagulation had been held in setting of initial rectal bleeding and thrombocytopenia. Platelets now improved to 89. Pharmacy has been consulted to dose heparin  for afib.   Given elevated risk for bleeding, will target lower end of therapeutic range for heparin  level and will not give bolus dose. Last dose of Eliquis  was 8/4; however, will check aPTT with first heparin  level to verify correlation given impaired renal function may impact clearance of Eliquis .  AM: heparin  level 0.27 and aPTT 58 seconds  (correlated) below goal on 1100 units/hr. Per RN, no issues with heparin  running continuously or signs/symptoms of bleeding. Will check with heparin  levels from now on.  Goal of Therapy:  Heparin  level 0.3-0.5 aPTT 66-85 seconds  Monitor platelets by anticoagulation protocol: Yes   Plan:  Increase heparin  infusion to 1300 units/hr.  Check heparin  level in 8h.  Monitor heparin  level, CBC, and s/sx bleeding daily.   Lynwood Poplar, PharmD, BCPS Clinical Pharmacist 05/13/2024 11:37 PM

## 2024-05-13 NOTE — Plan of Care (Signed)
 Patient and his wife, [name], indicated that they decided for Clee not to get further radiation or chemotherapy. Their goal is for him to stabilize enough to go home for the time he has left. Tkai seems at peace with this decision and says he is sure this is what he wants.

## 2024-05-14 ENCOUNTER — Ambulatory Visit

## 2024-05-14 DIAGNOSIS — I5023 Acute on chronic systolic (congestive) heart failure: Secondary | ICD-10-CM | POA: Diagnosis not present

## 2024-05-14 DIAGNOSIS — N179 Acute kidney failure, unspecified: Secondary | ICD-10-CM | POA: Diagnosis not present

## 2024-05-14 DIAGNOSIS — I4891 Unspecified atrial fibrillation: Secondary | ICD-10-CM | POA: Diagnosis not present

## 2024-05-14 DIAGNOSIS — N189 Chronic kidney disease, unspecified: Secondary | ICD-10-CM

## 2024-05-14 DIAGNOSIS — C090 Malignant neoplasm of tonsillar fossa: Secondary | ICD-10-CM | POA: Diagnosis not present

## 2024-05-14 DIAGNOSIS — R579 Shock, unspecified: Secondary | ICD-10-CM | POA: Diagnosis not present

## 2024-05-14 DIAGNOSIS — K625 Hemorrhage of anus and rectum: Secondary | ICD-10-CM | POA: Diagnosis not present

## 2024-05-14 LAB — GLUCOSE, CAPILLARY
Glucose-Capillary: 259 mg/dL — ABNORMAL HIGH (ref 70–99)
Glucose-Capillary: 144 mg/dL — ABNORMAL HIGH (ref 70–99)
Glucose-Capillary: 101 mg/dL — ABNORMAL HIGH (ref 70–99)
Glucose-Capillary: 166 mg/dL — ABNORMAL HIGH (ref 70–99)
Glucose-Capillary: 186 mg/dL — ABNORMAL HIGH (ref 70–99)
Glucose-Capillary: 121 mg/dL — ABNORMAL HIGH (ref 70–99)

## 2024-05-14 LAB — BASIC METABOLIC PANEL WITH GFR
Anion gap: 14 (ref 5–15)
BUN: 68 mg/dL — ABNORMAL HIGH (ref 8–23)
CO2: 28 mmol/L (ref 22–32)
Calcium: 9.1 mg/dL (ref 8.9–10.3)
Chloride: 98 mmol/L (ref 98–111)
Creatinine, Ser: 2.41 mg/dL — ABNORMAL HIGH (ref 0.61–1.24)
GFR, Estimated: 27 mL/min — ABNORMAL LOW (ref >60)
Glucose, Bld: 274 mg/dL — ABNORMAL HIGH (ref 70–99)
Potassium: 3.5 mmol/L (ref 3.5–5.1)
Sodium: 140 mmol/L (ref 135–145)

## 2024-05-14 LAB — CBC
HCT: 33.3 % — ABNORMAL LOW (ref 39.0–52.0)
Hemoglobin: 10.9 g/dL — ABNORMAL LOW (ref 13.0–17.0)
MCH: 30.4 pg (ref 26.0–34.0)
MCHC: 32.7 g/dL (ref 30.0–36.0)
MCV: 93 fL (ref 80.0–100.0)
Platelets: 115 K/uL — ABNORMAL LOW (ref 150–400)
RBC: 3.58 MIL/uL — ABNORMAL LOW (ref 4.22–5.81)
RDW: 13.5 % (ref 11.5–15.5)
WBC: 14.3 K/uL — ABNORMAL HIGH (ref 4.0–10.5)
nRBC: 0 % (ref 0.0–0.2)

## 2024-05-14 LAB — HEPARIN LEVEL (UNFRACTIONATED): Heparin Unfractionated: 0.33 [IU]/mL (ref 0.30–0.70)

## 2024-05-14 LAB — MAGNESIUM: Magnesium: 1.8 mg/dL (ref 1.7–2.4)

## 2024-05-14 MED ORDER — INSULIN GLARGINE-YFGN 100 UNIT/ML ~~LOC~~ SOLN
30.0000 [IU] | Freq: Every day | SUBCUTANEOUS | Status: DC
  Administered 2024-05-14 (×2): 30 [IU] via SUBCUTANEOUS
  Filled 2024-05-14 (×3): qty 0.3

## 2024-05-14 MED ORDER — MAGNESIUM SULFATE 2 GM/50ML IV SOLN
2.0000 g | Freq: Once | INTRAVENOUS | Status: AC
  Administered 2024-05-14 (×2): 2 g via INTRAVENOUS
  Filled 2024-05-14: qty 50

## 2024-05-14 MED ORDER — INSULIN ASPART 100 UNIT/ML IJ SOLN
8.0000 [IU] | INTRAMUSCULAR | Status: DC
  Administered 2024-05-14 – 2024-05-17 (×17): 8 [IU] via SUBCUTANEOUS

## 2024-05-14 MED ORDER — APIXABAN 5 MG PO TABS
5.0000 mg | ORAL_TABLET | ORAL | Status: AC
  Administered 2024-05-14 (×2): 5 mg
  Filled 2024-05-14: qty 1

## 2024-05-14 MED ORDER — INSULIN ASPART 100 UNIT/ML IJ SOLN
0.0000 [IU] | INTRAMUSCULAR | Status: DC
  Administered 2024-05-14 – 2024-05-15 (×8): 3 [IU] via SUBCUTANEOUS
  Administered 2024-05-16: 5 [IU] via SUBCUTANEOUS
  Administered 2024-05-16: 3 [IU] via SUBCUTANEOUS
  Administered 2024-05-16: 5 [IU] via SUBCUTANEOUS
  Administered 2024-05-16 (×2): 3 [IU] via SUBCUTANEOUS
  Administered 2024-05-17 – 2024-05-18 (×3): 5 [IU] via SUBCUTANEOUS
  Administered 2024-05-18: 8 [IU] via SUBCUTANEOUS
  Administered 2024-05-18: 3 [IU] via SUBCUTANEOUS
  Administered 2024-05-18: 5 [IU] via SUBCUTANEOUS
  Administered 2024-05-19: 8 [IU] via SUBCUTANEOUS
  Administered 2024-05-19 (×2): 3 [IU] via SUBCUTANEOUS

## 2024-05-14 MED ORDER — APIXABAN 5 MG PO TABS: 5.0000 mg | ORAL_TABLET | ORAL | Status: DC

## 2024-05-14 MED ORDER — TORSEMIDE 20 MG PO TABS
20.0000 mg | ORAL_TABLET | Freq: Every day | ORAL | Status: DC
  Administered 2024-05-14 – 2024-05-19 (×7): 20 mg
  Filled 2024-05-14 (×6): qty 1

## 2024-05-14 MED ORDER — POTASSIUM CHLORIDE 20 MEQ PO PACK: 40.0000 meq | PACK | Freq: Two times a day (BID) | ORAL | Status: DC

## 2024-05-14 MED ORDER — APIXABAN 5 MG PO TABS: 5.0000 mg | ORAL_TABLET | Freq: Two times a day (BID) | ORAL | Status: DC

## 2024-05-14 MED ORDER — POTASSIUM CHLORIDE 20 MEQ PO PACK
40.0000 meq | PACK | Freq: Once | ORAL | Status: AC
  Administered 2024-05-14 (×2): 40 meq
  Filled 2024-05-14: qty 2

## 2024-05-14 MED ORDER — LOPERAMIDE HCL 2 MG PO CAPS
2.0000 mg | ORAL_CAPSULE | ORAL | Status: DC | PRN
  Administered 2024-05-14 – 2024-05-16 (×4): 2 mg via ORAL
  Filled 2024-05-14 (×3): qty 1

## 2024-05-14 MED ORDER — MIDODRINE HCL 5 MG PO TABS
10.0000 mg | ORAL_TABLET | Freq: Three times a day (TID) | ORAL | Status: DC
  Administered 2024-05-14 – 2024-05-16 (×10): 10 mg via ORAL
  Filled 2024-05-14 (×7): qty 2

## 2024-05-14 MED ORDER — APIXABAN 5 MG PO TABS
5.0000 mg | ORAL_TABLET | Freq: Two times a day (BID) | ORAL | Status: DC
  Administered 2024-05-14 (×2): 5 mg
  Filled 2024-05-14: qty 1

## 2024-05-14 MED ORDER — PIPERACILLIN-TAZOBACTAM 3.375 G IVPB
3.3750 g | Freq: Three times a day (TID) | INTRAVENOUS | Status: AC
  Administered 2024-05-14 (×4): 3.375 g via INTRAVENOUS
  Filled 2024-05-14: qty 50

## 2024-05-14 MED ORDER — MAGNESIUM SULFATE 2 GM/50ML IV SOLN: 2.0000 g | Freq: Once | INTRAVENOUS | Status: DC

## 2024-05-14 NOTE — Anesthesia Preprocedure Evaluation (Addendum)
 Anesthesia Evaluation  Patient identified by MRN, date of birth, ID band Patient awake    Reviewed: Allergy & Precautions, H&P , NPO status , Patient's Chart, lab work & pertinent test results  Airway Mallampati: III  TM Distance: >3 FB Neck ROM: Full    Dental  (+) Teeth Intact   Pulmonary shortness of breath   Pulmonary exam normal breath sounds clear to auscultation       Cardiovascular hypertension, Pt. on medications and Pt. on home beta blockers + CAD, + Past MI, + CABG and +CHF  + dysrhythmias + pacemaker + Cardiac Defibrillator + Valvular Problems/Murmurs  Rhythm:Irregular Rate:Normal  Echo 05/2024  1. Left ventricular ejection fraction, by estimation, is <20%. The left  ventricle has severely decreased function. The left ventricle demonstrates  global hypokinesis. The left ventricular internal cavity size was severely  dilated. There is mild  concentric left ventricular hypertrophy. Left ventricular diastolic  function could not be evaluated. No LV thrombus.   2. Right ventricular systolic function is normal. The right ventricular  size is severely enlarged. Tricuspid regurgitation signal is inadequate  for assessing PA pressure.   3. Right atrial size was moderately dilated.   4. The mitral valve has been repaired/replaced. No evidence of mitral  valve regurgitation. No evidence of mitral stenosis. The mean mitral valve  gradient is 6.5 mmHg. There is a bioprosthetic valve present in the mitral  position.   5. Tricuspid valve regurgitation is moderate.   6. The aortic valve is tricuspid. Aortic valve regurgitation is not  visualized. Aortic valve sclerosis/calcification is present, without any  evidence of aortic stenosis. Aortic valve mean gradient measures 8.7 mmHg.  Aortic valve Vmax measures 2.01 m/s.   7. The inferior vena cava is dilated in size with <50% respiratory  variability, suggesting right atrial  pressure of 15 mmHg.    1. Left ventricular ejection fraction, by estimation, is <20%. The left  ventricle has severely decreased function. The left ventricle demonstrates  global hypokinesis. The left ventricular internal cavity size was severely  dilated. There is mild  concentric left ventricular hypertrophy. Left ventricular diastolic  function could not be evaluated. No LV thrombus.   2. Right ventricular systolic function is normal. The right ventricular  size is severely enlarged. Tricuspid regurgitation signal is inadequate  for assessing PA pressure.   3. Right atrial size was moderately dilated.   4. The mitral valve has been repaired/replaced. No evidence of mitral  valve regurgitation. No evidence of mitral stenosis. The mean mitral valve  gradient is 6.5 mmHg. There is a bioprosthetic valve present in the mitral  position.   5. Tricuspid valve regurgitation is moderate.   6. The aortic valve is tricuspid. Aortic valve regurgitation is not  visualized. Aortic valve sclerosis/calcification is present, without any  evidence of aortic stenosis. Aortic valve mean gradient measures 8.7 mmHg.  Aortic valve Vmax measures 2.01 m/s.   7. The inferior vena cava is dilated in size with <50% respiratory  variability, suggesting right atrial pressure of 15 mmHg.      Neuro/Psych   Anxiety     negative neurological ROS  negative psych ROS   GI/Hepatic negative GI ROS, Neg liver ROS,,,  Endo/Other  diabetes, Type 2, Oral Hypoglycemic AgentsHypothyroidism    Renal/GU Renal disease     Musculoskeletal   Abdominal   Peds  Hematology negative hematology ROS (+)   Anesthesia Other Findings   Reproductive/Obstetrics  Anesthesia Physical Anesthesia Plan  ASA: 4  Anesthesia Plan: MAC   Post-op Pain Management: Minimal or no pain anticipated   Induction: Intravenous  PONV Risk Score and Plan: 2 and Ondansetron ,  Dexamethasone , TIVA and Treatment may vary due to age or medical condition  Airway Management Planned: Simple Face Mask  Additional Equipment:   Intra-op Plan:   Post-operative Plan:   Informed Consent: I have reviewed the patients History and Physical, chart, labs and discussed the procedure including the risks, benefits and alternatives for the proposed anesthesia with the patient or authorized representative who has indicated his/her understanding and acceptance.     Dental advisory given  Plan Discussed with: CRNA  Anesthesia Plan Comments: (Risks of anesthesia explained at length. This includes, but is not limited to, sore throat, damage to teeth, lips gums, tongue and vocal cords, nausea and vomiting, reactions to medications, stroke, heart attack, and death. All patient questions were answered and the patient wishes to proceed. )         Anesthesia Quick Evaluation

## 2024-05-14 NOTE — Evaluation (Addendum)
 Occupational Therapy Evaluation Patient Details Name: Christian Bowman MRN: [id number] DOB: July 30, 1946 Today's Date: 05/14/2024   History of Present Illness   Pt is a 78 y/o male presenting on 8/5 with rectal bleeding. GI felt rectal bleeding secondary to hemorrhoids.  Course complicated by ongoing hypotension. Concern for sepsis, cardiogenic shock. 8/8 chest xray with pulmonary congestion vs PNA. 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     Clinical Impressions Patient admitted for above and presents with problem list below.  PTA patient independent with ADLs and mobility. Currently requires min assist +2 safety for transfers and step pivot to recliner, up to min assist for ADLs.  He has generalized weakness, decreased activity tolerance and endurance, mild dizziness with OOB mobility but BP recovered with sitting-- see below for details.  Pt has good support from spouse.  Based on performance today, pt will best benefit from continued OT services acutely and after dc at [employer] level to optimize independence and safety with ADLs, IADLs and mobility.   BP supine: 110/75 BP seated EOB: 101/87 BP standing: 88/54 BP seated after standing: 108/71     If plan is discharge home, recommend the following:   A little help with walking and/or transfers;A little help with bathing/dressing/bathroom;Assistance with cooking/housework;Assist for transportation;Help with stairs or ramp for entrance     Functional Status Assessment   Patient has had a recent decline in their functional status and demonstrates the ability to make significant improvements in function in a reasonable and predictable amount of time.     Equipment Recommendations   BSC/3in1;Other (comment);Tub/shower bench (RW)     Recommendations for Other Services         Precautions/Restrictions   Precautions Precautions: Fall Recall of  Precautions/Restrictions: Intact Precaution/Restrictions Comments: protective prec, PEG, SBP >100 Restrictions Weight Bearing Restrictions Per Provider Order: No     Mobility Bed Mobility Overal bed mobility: Needs Assistance Bed Mobility: Supine to Sit     Supine to sit: Contact guard     General bed mobility comments: Increased time and cues to sit R EOB    Transfers Overall transfer level: Needs assistance Equipment used: Rolling walker (2 wheels) Transfers: Sit to/from Stand, Bed to chair/wheelchair/BSC Sit to Stand: Min assist, +2 safety/equipment     Step pivot transfers: Contact guard assist, +2 safety/equipment     General transfer comment: Sit to stand x2 from EOB. MinA to rise. steady and cues for hand placement for RW. Cues for walker management to turn to recliner      Balance Overall balance assessment: Needs assistance Sitting-balance support: Feet supported, Bilateral upper extremity supported Sitting balance-Leahy Scale: Fair     Standing balance support: Bilateral upper extremity supported, During functional activity, Reliant on assistive device for balance Standing balance-Leahy Scale: Fair Standing balance comment: Steady while standing statically. Reliant on RW to steady while turning                           ADL either performed or assessed with clinical judgement   ADL Overall ADL's : Needs assistance/impaired     Grooming: Set up;Sitting           Upper Body Dressing : Set up;Sitting   Lower Body Dressing: Minimal assistance;+2 for safety/equipment;Sit to/from stand   Toilet Transfer: Minimal assistance;+2 for safety/equipment;Rolling walker (2 wheels) Toilet Transfer Details (indicate cue type and reason): to reclienr  Functional mobility during ADLs: Minimal assistance;+2 for safety/equipment;Rolling walker (2 wheels)       Vision   Vision Assessment?: No apparent visual deficits     Perception          Praxis         Pertinent Vitals/Pain Pain Assessment Pain Assessment: No/denies pain     Extremity/Trunk Assessment Upper Extremity Assessment Upper Extremity Assessment: Generalized weakness   Lower Extremity Assessment Lower Extremity Assessment: Defer to PT evaluation   Cervical / Trunk Assessment Cervical / Trunk Assessment: Kyphotic   Communication Communication Communication: Impaired Factors Affecting Communication: Hearing impaired   Cognition Arousal: Alert Behavior During Therapy: WFL for tasks assessed/performed Cognition: No apparent impairments                               Following commands: Intact       Cueing  General Comments   Cueing Techniques: Verbal cues;Gestural cues;Tactile cues  pt reports mildly dizzy with standing, SBP does drop to 88 after standing but recovers to 120 in sitting   Exercises     Shoulder Instructions      Home Living Family/patient expects to be discharged to:: Private residence Living Arrangements: Spouse/significant other Available Help at Discharge: Family Type of Home: House Home Access: Stairs to enter [employer] of Steps: 10, 1 (1 step into house, 10 steps to basement where he likes to hang out) Entrance Stairs-Rails: Right;None Home Layout: One level     Bathroom Shower/Tub: Chief Strategy Officer: Standard Bathroom Accessibility: No   Home Equipment: Cane - quad          Prior Functioning/Environment Prior Level of Function : Needs assist             Mobility Comments: retired, walking and driving prior to end of May, was doing Cardiac Rehab, was not walking well needed to add quad cane to walk to radiation ADLs Comments: 8/3 was able to shower, and get himself dressed    OT Problem List: Decreased strength;Decreased activity tolerance;Impaired balance (sitting and/or standing);Decreased knowledge of use of DME or AE;Decreased knowledge of  precautions   OT Treatment/Interventions: Self-care/ADL training;Therapeutic exercise;Energy conservation;DME and/or AE instruction;Therapeutic activities;Patient/family education;Balance training      OT Goals(Current goals can be found in the care plan section)   Acute Rehab OT Goals Patient Stated Goal: home OT Goal Formulation: With patient Time For Goal Achievement: 05/28/24 Potential to Achieve Goals: Good   OT Frequency:  Min 2X/week    Co-evaluation PT/OT/SLP Co-Evaluation/Treatment: Yes Reason for Co-Treatment: For patient/therapist safety;To address functional/ADL transfers PT goals addressed during session: Mobility/safety with mobility;Strengthening/ROM;Proper use of DME OT goals addressed during session: ADL's and self-care      AM-PAC OT 6 Clicks Daily Activity     Outcome Measure Help from another person eating meals?: Total Help from another person taking care of personal grooming?: A Little Help from another person toileting, which includes using toliet, bedpan, or urinal?: A Little Help from another person bathing (including washing, rinsing, drying)?: A Little Help from another person to put on and taking off regular upper body clothing?: A Little Help from another person to put on and taking off regular lower body clothing?: A Little 6 Click Score: 16   End of Session Equipment Utilized During Treatment: Gait belt;Rolling walker (2 wheels) Nurse Communication: Mobility status  Activity Tolerance: Patient tolerated treatment well Patient left: in chair;with  call bell/phone within reach;with family/visitor present  OT Visit Diagnosis: Other abnormalities of gait and mobility (R26.89);Muscle weakness (generalized) (M62.81);Other (comment) (decreased activity tolerance)                Time: 8972-8893 OT Time Calculation (min): 39 min Charges:  OT General Charges $OT Visit: 1 Visit OT Evaluation $OT Eval Moderate Complexity: 1 Mod  Etta NOVAK,  OT Acute Rehabilitation Services Office [phone] Secure Chat Preferred    Etta GORMAN Hope 05/14/2024, 2:06 PM

## 2024-05-14 NOTE — H&P (View-Only) (Signed)
 Advanced Heart Failure Rounding Note  HF Cardiologist: Ezra Shuck, MD   Chief Complaint: Neutropenic / Septic Shock  Patient Profile   Christian Bowman is a 78 y.o. 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   Admitted with septic shock w/ component of cardiogenic shock.   Subjective:    Difficulty weaning off NE, currently at 5 mcg/min.  ~ 2.2L UOP last 24 hrs with 80 mg lasix  IV.  Hgb and platelets stable after starting heparin  gtt for Afib yesterday.   Patient is sleeping at time of my evaluation. Spouse at bedside and provided interval history.   She states he has decided that he does not want to continue XRT/chemo. He wants to focus on quality of life and hopes to discharge home soon.  Objective:    Weight Range: 79.1 kg Body mass index is 23.65 kg/m.   Vital Signs:   Temp:  [97.5 F (36.4 C)-98.7 F (37.1 C)] 97.5 F (36.4 C) (08/13 0730) Pulse Rate:  [54-110] 90 (08/13 0730) Resp:  [16-45] 39 (08/13 0730) BP: (79-124)/(43-109) 112/47 (08/13 0730) SpO2:  [92 %-97 %] 97 % (08/13 0730) Weight:  [79.1 kg-79.8 kg] 79.1 kg (08/13 0500) Last BM Date : 05/13/24  Weight change: Filed Weights   05/12/24 0500 05/13/24 0904 05/14/24 0500  Weight: 79.6 kg 79.8 kg 79.1 kg   Intake/Output:  Intake/Output Summary (Last 24 hours) at 05/14/2024 0811 Last data filed at 05/14/2024 0800 Gross per 24 hour  Intake 2425.95 ml  Output 2185 ml  Net 240.95 ml    Physical Exam    General:  Chronically ill appearing. Resting comfortably. Spouse at bedside. Cor: Rhythm irregular on telemetry Lungs: Breathing  nonlabored. Extremities: no edema  Telemetry   VP 90s-100s, underlying AF, runs of NSVT up to 15 beats in duration  Labs    CBC Recent Labs    05/13/24 0549 05/14/24 0353  WBC 13.6* 14.3*  HGB 9.9* 10.9*  HCT 29.8* 33.3*  MCV 93.1 93.0  PLT 89* 115*   Basic Metabolic Panel Recent Labs    91/87/74 0549 05/14/24 0353  NA 141 140  K 3.2* 3.5  CL 101 98  CO2 27 28  GLUCOSE 223* 274*  BUN 68* 68*  CREATININE 2.25* 2.41*  CALCIUM  8.8* 9.1  MG  --  1.8   Liver Function Tests Recent Labs    05/12/24 0235 05/12/24 2058  AST 26 32  ALT 37 39  ALKPHOS 70 78  BILITOT 1.4* 1.3*  PROT 5.5* 5.6*  ALBUMIN  2.0* 2.1*   BNP (last 3 results) Recent Labs    05/08/24 0424 05/09/24 0401 05/11/24 0813  BNP 3,366.2* 3,731.2* 4,153.6*   Imaging   No results found.  Medications:    Scheduled Medications:  Chlorhexidine  Gluconate Cloth  6 each Topical Daily   feeding supplement (PROSource TF20)  60 mL Per Tube BID   fiber  1 packet Per Tube BID   fluconazole   100 mg Per Tube Daily   hydrocortisone    Rectal BID   insulin  aspart  0-20 Units Subcutaneous Q4H   insulin  aspart  6 Units Subcutaneous Q4H   insulin  glargine-yfgn  20  Units Subcutaneous Daily   levothyroxine   50 mcg Per Tube QAC breakfast   melatonin  3 mg Oral QHS   pantoprazole  (PROTONIX ) IV  40 mg Intravenous Q24H   rosuvastatin   10 mg Per Tube Daily   sodium chloride  flush  10-40 mL Intracatheter Q12H   sodium chloride  flush  3 mL Intravenous Q12H    Infusions:  feeding supplement (OSMOLITE 1.5 CAL) 60 mL/hr at 05/14/24 0800   heparin  1,300 Units/hr (05/14/24 0800)   norepinephrine  (LEVOPHED ) Adult infusion 3 mcg/min (05/14/24 0800)   piperacillin -tazobactam (ZOSYN )  IV Stopped (05/14/24 9261)   vasopressin  Stopped (05/13/24 0455)    PRN Medications: acetaminophen  **OR** acetaminophen , ondansetron  (ZOFRAN ) IV, oxyCODONE , senna-docusate, sodium chloride  flush  Assessment/Plan   1. Neutropenic /  Septic Shock  - likely primarily septic physiology with component of cardiogenic shock - PCT 9.19. Bcx x 2 negative. - Afebrile. WBC 1.7>6>12.9>18.5>CBC pending today - ABX per CCM - pressor requirment significantly improved.  - Off Vaso. Currently on 5 NE. Wean as able. Target SBP ~ 100, MAP goal difficult d/t wide pulse pressure. Will add midodrine  10 TID.   2. Acute on chronic systolic CHF: Ischemic cardiomyopathy.  [employer] CRT-D.  TEE 10/17 with EF 20-25%.  Echo 3/21 EF 20-25%, nl RV, nl bioprosthetic MVR.  CPX 3/21 severe functional impairment due to HF with concern for poor short-term prognosis.  RHC in 4/21 showed normal filling pressures but low cardiac index. Last echo 9/24 EF < 20% with global hypokinesis. Echo this admission unchanged. He is not a transplant candidate.  He has had 2 prior sternotomies, which makes LVAD more complicated.  We discussed LVAD extensively.  He is very clear that he does not want another cardiac surgery.  His goal is to be able to return home.  - off GDMT with shock  - Wean NE as able. Midodrine  as above to assist with pressor wean. - Takes lasix  PRN at home. Has diuresed with IV lasix . Will start po Torsemide  20 mg daily. - BiV pacing down to 83% with recurrent AF. Would like to get back in SR. See below.   3. Tonsillar CA (HPV +) - currently getting chemo and XRT - Dr. Timmy following. Feels there is strong chance of cure. Despite this he expresses that he does not want to continue treatment and wishes to focus on quality of life.  Has had a hard time tolerating treatment.    4. CAD: s/p redo CABG.  Admission in 3/18 with NSTEMI, LHC showed occlusion of SVG-PDA from CABG#1 but patent SVG-PDA from CABG#2, no intervention.  He had a post-op NSTEMI after inguinal hernia repair in 11/18.  LHC showed occlusion of a PLV branch that had been backfilled by SVG-PDA (prior cath had shown severe diffuse disease in the PLV).  - no current s/s angina  - on statin    5. Bioprosthetic mitral valve: stable   6. Atrial flutter/fibrillation: s/p flutter ablation in 8/18.  In 4/25, runs of atrial fibrillation were seen by device interrogation and apixaban  was started.  - Device interrogated. Appears that he went back into AF on 08/07. Decreased BiV pacing to 83%. Ideally get back in SR. - Heparin  started 08/12, tolerating without bleeding. Switch to Eliquis . Plan TEE/DCCV tomorrow. TEE may carry increased risk w/ recent radiation to neck.  Informed Consent   Shared Decision Making/Informed Consent   The risks [stroke, cardiac arrhythmias rarely resulting in the need for a temporary or permanent pacemaker, skin irritation or  burns, esophageal damage, perforation (1:10,000 risk), bleeding, pharyngeal hematoma as well as other potential complications associated with conscious sedation including aspiration, arrhythmia, respiratory failure and death], benefits (treatment guidance, restoration of normal sinus rhythm, diagnostic support) and alternatives of a transesophageal echocardiogram guided cardioversion were discussed in detail with Mr. Arentz and he is willing to proceed.   7. AKI on CKD: Stage III - Scr 1.5 at baseline - Scr averaging low 2s this admit - suspect ATN in setting of shock  8. NSVT - suspect d/t advanced heart failure w/ volume overload - diuresis as above - Keep K > 4 and Mag > 2, supp K and Mag today   Continue to wean NE as able. TEE/DCCV tomorrow. Aim to try to get home within next few days as this is his main priority. PT/OT consulted to help anticipate discharge needs.  Length of Stay: 6  FINCH, LINDSAY N, PA-C  05/14/2024, 8:11 AM  Advanced Heart Failure Team Pager [phone] (M-F; 7a - 5p)  Please contact [facility] for night-coverage after hours (5p -7a ) and weekends on [email or web address]  Agree with above.   Remains on low-dose NE fro BP support. Feels weak but otherwise ok.   Remains in AF.   Adamantly against any further  chemo or XRT   General:  Weak appearing. No resp difficulty HEENT: normal Neck: supple. Large neck cyst no JVD. Carotids 2+ bilat; no bruits. No lymphadenopathy or thryomegaly appreciated. Cor: PMI nondisplaced. Regular rate & rhythm. No rubs, gallops or murmurs. Lungs: clear Abdomen: soft, nontender, nondistended. No hepatosplenomegaly. No bruits or masses. Good bowel sounds. Extremities: no cyanosis, clubbing, rash, edema Neuro: alert & orientedx3, cranial nerves grossly intact. moves all 4 extremities w/o difficulty. Affect pleasant  He remains NE dependent. Will add low-dose midodrine  to try to liberate from NE. We discussed need to get him back in NSR to help optimize CRT and cardiac output. He is tolerating heparin . Switch heparin  to eliquis . Plan TEE/DC-CV tomorrow.   Adamantly against any further chemo or XRT   CRITICAL CARE Performed by: Cherrie Sieving  Total critical care time: 45 minutes  Critical care time was exclusive of separately billable procedures and treating other patients.  Critical care was necessary to treat or prevent imminent or life-threatening deterioration.  Critical care was time spent personally by me (independent of midlevel providers or residents) on the following activities: development of treatment plan with patient and/or surrogate as well as nursing, discussions with consultants, evaluation of patient's response to treatment, examination of patient, obtaining history from patient or surrogate, ordering and performing treatments and interventions, ordering and review of laboratory studies, ordering and review of radiographic studies, pulse oximetry and re-evaluation of patient's condition.  Sieving Cherrie, MD  8:35 AM

## 2024-05-14 NOTE — Progress Notes (Addendum)
 Advanced Heart Failure Rounding Note  HF Cardiologist: Ezra Shuck, MD   Chief Complaint: Neutropenic / Septic Shock  Patient Profile   Christian Bowman is a 78 y.o. 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   Admitted with septic shock w/ component of cardiogenic shock.   Subjective:    Difficulty weaning off NE, currently at 5 mcg/min.  ~ 2.2L UOP last 24 hrs with 80 mg lasix  IV.  Hgb and platelets stable after starting heparin  gtt for Afib yesterday.   Patient is sleeping at time of my evaluation. Spouse at bedside and provided interval history.   She states he has decided that he does not want to continue XRT/chemo. He wants to focus on quality of life and hopes to discharge home soon.  Objective:    Weight Range: 79.1 kg Body mass index is 23.65 kg/m.   Vital Signs:   Temp:  [97.5 F (36.4 C)-98.7 F (37.1 C)] 97.5 F (36.4 C) (08/13 0730) Pulse Rate:  [54-110] 90 (08/13 0730) Resp:  [16-45] 39 (08/13 0730) BP: (79-124)/(43-109) 112/47 (08/13 0730) SpO2:  [92 %-97 %] 97 % (08/13 0730) Weight:  [79.1 kg-79.8 kg] 79.1 kg (08/13 0500) Last BM Date : 05/13/24  Weight change: Filed Weights   05/12/24 0500 05/13/24 0904 05/14/24 0500  Weight: 79.6 kg 79.8 kg 79.1 kg   Intake/Output:  Intake/Output Summary (Last 24 hours) at 05/14/2024 0811 Last data filed at 05/14/2024 0800 Gross per 24 hour  Intake 2425.95 ml  Output 2185 ml  Net 240.95 ml    Physical Exam    General:  Chronically ill appearing. Resting comfortably. Spouse at bedside. Cor: Rhythm irregular on telemetry Lungs: Breathing  nonlabored. Extremities: no edema  Telemetry   VP 90s-100s, underlying AF, runs of NSVT up to 15 beats in duration  Labs    CBC Recent Labs    05/13/24 0549 05/14/24 0353  WBC 13.6* 14.3*  HGB 9.9* 10.9*  HCT 29.8* 33.3*  MCV 93.1 93.0  PLT 89* 115*   Basic Metabolic Panel Recent Labs    91/87/74 0549 05/14/24 0353  NA 141 140  K 3.2* 3.5  CL 101 98  CO2 27 28  GLUCOSE 223* 274*  BUN 68* 68*  CREATININE 2.25* 2.41*  CALCIUM  8.8* 9.1  MG  --  1.8   Liver Function Tests Recent Labs    05/12/24 0235 05/12/24 2058  AST 26 32  ALT 37 39  ALKPHOS 70 78  BILITOT 1.4* 1.3*  PROT 5.5* 5.6*  ALBUMIN  2.0* 2.1*   BNP (last 3 results) Recent Labs    05/08/24 0424 05/09/24 0401 05/11/24 0813  BNP 3,366.2* 3,731.2* 4,153.6*   Imaging   No results found.  Medications:    Scheduled Medications:  Chlorhexidine  Gluconate Cloth  6 each Topical Daily   feeding supplement (PROSource TF20)  60 mL Per Tube BID   fiber  1 packet Per Tube BID   fluconazole   100 mg Per Tube Daily   hydrocortisone    Rectal BID   insulin  aspart  0-20 Units Subcutaneous Q4H   insulin  aspart  6 Units Subcutaneous Q4H   insulin  glargine-yfgn  20  Units Subcutaneous Daily   levothyroxine   50 mcg Per Tube QAC breakfast   melatonin  3 mg Oral QHS   pantoprazole  (PROTONIX ) IV  40 mg Intravenous Q24H   rosuvastatin   10 mg Per Tube Daily   sodium chloride  flush  10-40 mL Intracatheter Q12H   sodium chloride  flush  3 mL Intravenous Q12H    Infusions:  feeding supplement (OSMOLITE 1.5 CAL) 60 mL/hr at 05/14/24 0800   heparin  1,300 Units/hr (05/14/24 0800)   norepinephrine  (LEVOPHED ) Adult infusion 3 mcg/min (05/14/24 0800)   piperacillin -tazobactam (ZOSYN )  IV Stopped (05/14/24 9261)   vasopressin  Stopped (05/13/24 0455)    PRN Medications: acetaminophen  **OR** acetaminophen , ondansetron  (ZOFRAN ) IV, oxyCODONE , senna-docusate, sodium chloride  flush  Assessment/Plan   1. Neutropenic /  Septic Shock  - likely primarily septic physiology with component of cardiogenic shock - PCT 9.19. Bcx x 2 negative. - Afebrile. WBC 1.7>6>12.9>18.5>CBC pending today - ABX per CCM - pressor requirment significantly improved.  - Off Vaso. Currently on 5 NE. Wean as able. Target SBP ~ 100, MAP goal difficult d/t wide pulse pressure. Will add midodrine  10 TID.   2. Acute on chronic systolic CHF: Ischemic cardiomyopathy.  [employer] CRT-D.  TEE 10/17 with EF 20-25%.  Echo 3/21 EF 20-25%, nl RV, nl bioprosthetic MVR.  CPX 3/21 severe functional impairment due to HF with concern for poor short-term prognosis.  RHC in 4/21 showed normal filling pressures but low cardiac index. Last echo 9/24 EF < 20% with global hypokinesis. Echo this admission unchanged. He is not a transplant candidate.  He has had 2 prior sternotomies, which makes LVAD more complicated.  We discussed LVAD extensively.  He is very clear that he does not want another cardiac surgery.  His goal is to be able to return home.  - off GDMT with shock  - Wean NE as able. Midodrine  as above to assist with pressor wean. - Takes lasix  PRN at home. Has diuresed with IV lasix . Will start po Torsemide  20 mg daily. - BiV pacing down to 83% with recurrent AF. Would like to get back in SR. See below.   3. Tonsillar CA (HPV +) - currently getting chemo and XRT - Dr. Timmy following. Feels there is strong chance of cure. Despite this he expresses that he does not want to continue treatment and wishes to focus on quality of life.  Has had a hard time tolerating treatment.    4. CAD: s/p redo CABG.  Admission in 3/18 with NSTEMI, LHC showed occlusion of SVG-PDA from CABG#1 but patent SVG-PDA from CABG#2, no intervention.  He had a post-op NSTEMI after inguinal hernia repair in 11/18.  LHC showed occlusion of a PLV branch that had been backfilled by SVG-PDA (prior cath had shown severe diffuse disease in the PLV).  - no current s/s angina  - on statin    5. Bioprosthetic mitral valve: stable   6. Atrial flutter/fibrillation: s/p flutter ablation in 8/18.  In 4/25, runs of atrial fibrillation were seen by device interrogation and apixaban  was started.  - Device interrogated. Appears that he went back into AF on 08/07. Decreased BiV pacing to 83%. Ideally get back in SR. - Heparin  started 08/12, tolerating without bleeding. Switch to Eliquis . Plan TEE/DCCV tomorrow. TEE may carry increased risk w/ recent radiation to neck.  Informed Consent   Shared Decision Making/Informed Consent   The risks [stroke, cardiac arrhythmias rarely resulting in the need for a temporary or permanent pacemaker, skin irritation or  burns, esophageal damage, perforation (1:10,000 risk), bleeding, pharyngeal hematoma as well as other potential complications associated with conscious sedation including aspiration, arrhythmia, respiratory failure and death], benefits (treatment guidance, restoration of normal sinus rhythm, diagnostic support) and alternatives of a transesophageal echocardiogram guided cardioversion were discussed in detail with Mr. Quast and he is willing to proceed.   7. AKI on CKD: Stage III - Scr 1.5 at baseline - Scr averaging low 2s this admit - suspect ATN in setting of shock  8. NSVT - suspect d/t advanced heart failure w/ volume overload - diuresis as above - Keep K > 4 and Mag > 2, supp K and Mag today   Continue to wean NE as able. TEE/DCCV tomorrow. Aim to try to get home within next few days as this is his main priority. PT/OT consulted to help anticipate discharge needs.  Length of Stay: 6  FINCH, LINDSAY N, PA-C  05/14/2024, 8:11 AM  Advanced Heart Failure Team Pager [phone] (M-F; 7a - 5p)  Please contact [facility] for night-coverage after hours (5p -7a ) and weekends on [email or web address]  Agree with above.   Remains on low-dose NE fro BP support. Feels weak but otherwise ok.   Remains in AF.   Adamantly against any further  chemo or XRT   General:  Weak appearing. No resp difficulty HEENT: normal Neck: supple. Large neck cyst no JVD. Carotids 2+ bilat; no bruits. No lymphadenopathy or thryomegaly appreciated. Cor: PMI nondisplaced. Regular rate & rhythm. No rubs, gallops or murmurs. Lungs: clear Abdomen: soft, nontender, nondistended. No hepatosplenomegaly. No bruits or masses. Good bowel sounds. Extremities: no cyanosis, clubbing, rash, edema Neuro: alert & orientedx3, cranial nerves grossly intact. moves all 4 extremities w/o difficulty. Affect pleasant  He remains NE dependent. Will add low-dose midodrine  to try to liberate from NE. We discussed need to get him back in NSR to help optimize CRT and cardiac output. He is tolerating heparin . Switch heparin  to eliquis . Plan TEE/DC-CV tomorrow.   Adamantly against any further chemo or XRT   CRITICAL CARE Performed by: Cherrie Sieving  Total critical care time: 45 minutes  Critical care time was exclusive of separately billable procedures and treating other patients.  Critical care was necessary to treat or prevent imminent or life-threatening deterioration.  Critical care was time spent personally by me (independent of midlevel providers or residents) on the following activities: development of treatment plan with patient and/or surrogate as well as nursing, discussions with consultants, evaluation of patient's response to treatment, examination of patient, obtaining history from patient or surrogate, ordering and performing treatments and interventions, ordering and review of laboratory studies, ordering and review of radiographic studies, pulse oximetry and re-evaluation of patient's condition.  Sieving Cherrie, MD  8:35 AM

## 2024-05-14 NOTE — Evaluation (Signed)
 Physical Therapy Evaluation Patient Details Name: Christian Bowman MRN: [id number] DOB: Jul 22, 1946 Today's Date: 05/14/2024  History of Present Illness  Pt is a 78 y/o male presenting on 8/5 with rectal bleeding. GI felt rectal bleeding secondary to hemorrhoids.  Course complicated by ongoing hypotension. Concern for sepsis, cardiogenic shock. 8/8 chest xray with pulmonary congestion vs PNA. 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  Clinical Impression  Pt presents with decreased activity tolerance and generalized weakness. Pt needs mild assist for bed mobility and moderate assist for transfers. Pt unable to ambulate at this time due to orthostasis but plan on progressing gait. Pt to benefit from acute PT to address deficits. Pt states that he prefers to go home at this time with family given his diagnosis of cancer. PT to progress mobility as tolerated, and will continue to follow acutely.     BP supine: 110/75 BP seated EOB: 101/87 BP standing: 88/54 BP seated after standing: 108/71     If plan is discharge home, recommend the following: A lot of help with walking and/or transfers;A little help with bathing/dressing/bathroom;Assist for transportation;Assistance with cooking/housework   Can travel by Administrator walker (2 wheels);BSC/3in1;Other (comment) (Shower seat)  Recommendations for Other Services       Functional Status Assessment Patient has had a recent decline in their functional status and demonstrates the ability to make significant improvements in function in a reasonable and predictable amount of time.     Precautions / Restrictions Precautions Precautions: Fall Restrictions Weight Bearing Restrictions Per Provider Order: No      Mobility  Bed Mobility Overal bed mobility: Needs Assistance Bed Mobility: Supine to Sit     Supine to sit: Contact  guard     General bed mobility comments: Increased time and cues to sit R EOB    Transfers Overall transfer level: Needs assistance Equipment used: Rolling walker (2 wheels) Transfers: Sit to/from Stand, Bed to chair/wheelchair/BSC Sit to Stand: Min assist, +2 safety/equipment   Step pivot transfers: Contact guard assist, +2 safety/equipment       General transfer comment: Sit to stand x2 from EOB. MinA to rise. steady and cues for hand placement for RW. Cues for walker management to turn to recliner    Ambulation/Gait               General Gait Details: Not done at this time  Stairs            Wheelchair Mobility     Tilt Bed    Modified Rankin (Stroke Patients Only)       Balance Overall balance assessment: Needs assistance Sitting-balance support: Feet supported, Bilateral upper extremity supported Sitting balance-Leahy Scale: Fair     Standing balance support: Bilateral upper extremity supported, During functional activity, Reliant on assistive device for balance Standing balance-Leahy Scale: Fair Standing balance comment: Steady while standing statically. Reliant on RW to steady while turning                             Pertinent Vitals/Pain Pain Assessment Pain Assessment: No/denies pain    Home Living Family/patient expects to be discharged to:: Private residence Living Arrangements: Spouse/significant other Available Help at Discharge: Family Type of Home: House Home Access: Stairs to enter Entrance Stairs-Rails: Right;None Entrance Stairs-Number of Steps: 10, 1 (1 step into  house, 10 steps to basement where he likes to hang out)   Home Layout: One level Home Equipment: Cane - quad      Prior Function Prior Level of Function : Needs assist             Mobility Comments: retired, walking and driving prior to end of May, was doing Cardiac Rehab, was not walking well needed to add quad cane to walk to radiation ADLs  Comments: 8/3 was able to shower, and get himself dressed     Extremity/Trunk Assessment   Upper Extremity Assessment Upper Extremity Assessment: Generalized weakness    Lower Extremity Assessment Lower Extremity Assessment: Defer to PT evaluation    Cervical / Trunk Assessment Cervical / Trunk Assessment: Kyphotic  Communication   Communication Communication: Impaired Factors Affecting Communication: Hearing impaired    Cognition Arousal: Alert Behavior During Therapy: WFL for tasks assessed/performed   PT - Cognitive impairments: No apparent impairments                         Following commands: Intact       Cueing Cueing Techniques: Verbal cues, Gestural cues, Tactile cues     General Comments General comments (skin integrity, edema, etc.): pt reports mildly dizzy with standing, SBP does drop to 88 after standing but recovers to 120 in sitting    Exercises General Exercises - Lower Extremity Ankle Circles/Pumps: AROM, 10 reps, Both, Seated Long Arc Quad: AROM, 10 reps, Both, Seated   Assessment/Plan    PT Assessment Patient needs continued PT services  PT Problem List Decreased strength;Decreased activity tolerance;Decreased balance;Decreased mobility       PT Treatment Interventions DME instruction;Gait training;Functional mobility training;Therapeutic activities    PT Goals (Current goals can be found in the Care Plan section)  Acute Rehab PT Goals Patient Stated Goal: Return home PT Goal Formulation: With patient/family Time For Goal Achievement: 05/28/24 Potential to Achieve Goals: Good    Frequency Min 3X/week     Co-evaluation PT/OT/SLP Co-Evaluation/Treatment: Yes Reason for Co-Treatment: For patient/therapist safety;To address functional/ADL transfers PT goals addressed during session: Mobility/safety with mobility;Strengthening/ROM;Proper use of DME OT goals addressed during session: ADL's and self-care       AM-PAC PT 6  Clicks Mobility  Outcome Measure Help needed turning from your back to your side while in a flat bed without using bedrails?: A Little Help needed moving from lying on your back to sitting on the side of a flat bed without using bedrails?: A Little Help needed moving to and from a bed to a chair (including a wheelchair)?: A Little Help needed standing up from a chair using your arms (e.g., wheelchair or bedside chair)?: A Little Help needed to walk in hospital room?: A Lot Help needed climbing 3-5 steps with a railing? : Total 6 Click Score: 15    End of Session Equipment Utilized During Treatment: Gait belt Activity Tolerance: Patient limited by fatigue Patient left: in chair;with call bell/phone within reach;with family/visitor present Nurse Communication: Mobility status PT Visit Diagnosis: Other abnormalities of gait and mobility (R26.89);Muscle weakness (generalized) (M62.81)    Time: 8884-8862 PT Time Calculation (min) (ACUTE ONLY): 22 min   Charges:   PT Evaluation $PT Eval Moderate Complexity: 1 Mod   PT General Charges $$ ACUTE PT VISIT: 1 Visit         Quintin Campi, SPT  Acute Rehab  [phone]    Quintin Campi 05/14/2024, 2:49 PM

## 2024-05-14 NOTE — Progress Notes (Signed)
 Chaplain responded to a phone call request from the pastor of this patient asking that I meet with him. Because I had to leave to go to [facility], I introduced myself to the pt Christian Bowman and his wife Christian Bowman and asked if it would be okay to talk with them tomorrow (if they were open to it.) They happily agreed and welcomed me for a follow up visit on 8/14.

## 2024-05-14 NOTE — Progress Notes (Signed)
 Speech Language Pathology Treatment: Dysphagia  Patient Details Name: Christian Bowman MRN: [id number] DOB: 05-31-1946 Today's Date: 05/14/2024 Time: 8392-8354 SLP Time Calculation (min) (ACUTE ONLY): 38 min  Assessment / Plan / Recommendation Clinical Impression  Patient seen by SLP for skilled treatment focused on dysphagia goals. His spouse was present in the room as well. Patient was awake and alert but did indicate he was tired from earlier PT session. When asked patient's goal, he stated that he wanted to get out of the hospital and return home as soon as possible and enjoy what time he had left and get his affairs in order. He stated he understands this is a terminal disease. He also indicated that he wants to be able to eat solid foods, such as a gravy biscuit. His spouse added that he would like to be able to eat whatever he is allowed to have. SLP assessed his toleration of thin liquid PO's via straw sips and dys 2 (diced, canned peaches). He fed himself and did not exhibit any immediate coughing or throat clearing. He did exhibit intermittent throat clearing which occurred even prior to PO's and was productive at times of phlegm which he was able to expectorate. SLP discussed with patient and spouse options as far as PO recommendations. The first option is to continue with only floor stock snacks and slowly introduce soft, moist solids when he returns home. The other option was for MBS to objectively assess swallow function as SLP is unable to make clinical judgements in regards to safest PO's at bedside. Patient and spouse in agreement with this plan. He is getting a TEE next date so one of the SLP's will plan to check in with him in AM on Friday (8/14) to ensure he still is in agreement for MBS.    HPI HPI: Christian Bowman is a 78 yo male with PMH of tonsillar cancer on chemoradiation (last chemotherapy 7/31), CHF with EF <20%, A-fib on Eliquis , CAD s/p CABG, CKD, T2DM presenting to ED 8/5 with  rectal bleeding. GI evaluated 8/6, felt to be related to hemorrhoids. Developed hypotension despite multiple fluid boluses and a low-grade fever, w/u ongoing for septic shock. CXR 8/7 shows streaky bibasilar opacities is favored to reflect pulmonary vascular congestion with suspected interstitial edema, improving on CXR 8/9. Laryngoscopy 02/18/24 shows evidence of large exophytic mass involving the R tonsil and soft palate but without any obvious involvement of the BOT. PET scan 02/28/24 reveals a hypermetabolic mass centered in the R palantine tonsil pharyngeal region extending to the glossal tonsillar sulcus and the soft palate. Seen by OP SLP 04/03/24 without overt s/s of aspiration throughout a meal. Underwent G-tube placement 04/09/24.      SLP Plan  Continue with current plan of care          Recommendations  Diet recommendations: Other(comment) (continue with floor stock PO's only) Medication Administration: Via alternative means Supervision: Patient able to self feed Compensations: Slow rate;Small sips/bites Postural Changes and/or Swallow Maneuvers: Seated upright 90 degrees                  Oral care QID;Oral care before and after PO     Dysphagia, unspecified (R13.10)     Continue with current plan of care     Norleen IVAR Blase, MA, CCC-SLP Speech Therapy

## 2024-05-14 NOTE — Progress Notes (Signed)
 PHARMACY - ANTICOAGULATION CONSULT NOTE  Pharmacy Consult for IV heparin  Indication: atrial fibrillation  Allergies  Allergen Reactions   Xanax [Alprazolam] Other (See Comments)    Pt feels very weak, tired and feels paralyzed     Ambien  [Zolpidem ] Other (See Comments)    Delirium    Patient Measurements: Height: 6' (182.9 cm) Weight: 79.1 kg (174 lb 6.1 oz) IBW/kg (Calculated) : 77.6 HEPARIN  DW (KG): 79.8  Vital Signs: Temp: 97.5 F (36.4 C) (08/13 0730) Temp Source: Oral (08/13 0730) BP: 112/47 (08/13 0730) Pulse Rate: 90 (08/13 0730)  Labs: Recent Labs    05/12/24 0235 05/12/24 2058 05/13/24 0549 05/13/24 0953 05/13/24 1221 05/13/24 2306 05/14/24 0353 05/14/24 0950  HGB 10.6*  --  9.9*  --   --   --  10.9*  --   HCT 30.9*  --  29.8*  --   --   --  33.3*  --   PLT 77*  --  89*  --   --   --  115*  --   APTT  --   --   --   --   --  58*  --   --   HEPARINUNFRC  --   --   --   --   --  0.27*  --  0.33  CREATININE 2.30* 2.31* 2.25*  --   --   --  2.41*  --   TROPONINIHS  --   --   --  99* 132*  --   --   --    Estimated Creatinine Clearance: 28.6 mL/min (A) (by C-G formula based on SCr of 2.41 mg/dL (H)).  Assessment: Patient is a 78 YO M who initially presented with rectal bleeding and was admitted to the ICU for septic shock/febrile neutropenia with cardiogenic component. Patient has afib and was on Eliquis  PTA. Anticoagulation had been held in setting of initial rectal bleeding and thrombocytopenia. Platelets now improved to 115. Pharmacy has been consulted to dose heparin  for afib.   Last dose of Eliquis  was 8/4; monitoring by heparin  level since the first heparin  level and aPTT correlated. Given elevated risk for bleeding, will continue to target lower end of therapeutic range.   8/13 AM Update: heparin  level therapeutic at 0.33 drawn this morning at ~10:00 on infusion of 1300 units/h. No issues with heparin  infusion or new s/sx bleeding documented.   Goal of  Therapy:  Heparin  level 0.3-0.5 Monitor platelets by anticoagulation protocol: Yes   Plan:  Continue heparin  infusion at 1300 units/hr.  Check heparin  level in 8h.  Monitor heparin  level, CBC, and s/sx bleeding daily.   Maurilio Patten, PharmD PGY1 Pharmacy Resident [facility] 05/14/2024 11:19 AM

## 2024-05-14 NOTE — Progress Notes (Signed)
 [facility] CSW Progress Note  Clinical Social Worker was scheduled to follow up with patient via telephone on this date to assess psychosocial needs. Patient currently hospitalized related to effects of treatment. CSW will not call patient on this date. CSW will continue to follow treatment / case, following up when more appropriate.      Follow Up Plan:  pending discharge from hospital.     Lizbeth Sprague, LCSW Clinical Social Worker [facility]

## 2024-05-14 NOTE — Progress Notes (Signed)
 NAME:  AHREN PETTINGER, MRN:  [id number], DOB:  11-13-1945, LOS: 6 ADMISSION DATE:  05/06/2024 CHIEF COMPLAINT:  NV diarrhea.    History of Present Illness:  EBAN WEICK is a 78 y.o. 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  He presented to [facility] ED on 8/5 with rectal bleeding.   He was admitted by the hospitalist team and was evaluated by GI on 8/6.  Was felt that his rectal bleeding was most likely secondary to hemorrhoids for which medical management was recommended.   Over the course of the day, he had ongoing hypotension despite multiple fluid boluses.  Mental status had remained the same and he was essentially asymptomatic.  He had had 2 BMs prior in the day that was somewhat loose but not overt diarrhea and no true hematochezia.  He had had low-grade fever early in the morning but had resolved after 1 dose of Tylenol .  His Coreg  and Flomax  was stopped, he was given additional gentle IV fluids, albumin  as well as started on midodrine .  H&H remained stable (11/32).  Echocardiogram has been ordered and is pending.  Lactic acid 1.9, Pro-Cal 2.79.   Unfortunately later in the afternoon, he remained hypotensive (systolics in the 80s) and PCCM was subsequently called to see in consultation. Hospital course   8/6 admitted, seen by GI 8/7 pccm asked to see for shock 8/8: Chest x-ray: Worsening right middle lung opacity could be due to pulmonary congestion versus pneumonia.POCUS done: Some B-lines more on the left.  No consolidation seen on lung POCUS.  Cardiac POCUS showing severely reduced EF.  IVC around 2.5 cm with without  much respiratory variation. 8/9 added Epi. Was already on fairly high dose norepi. No sig decrease on Norepi needs. Scr rising a little w/ on-going lasix . Changed to dobut in early evening. Pressors needs increased after I added dobut. Co-ox actually down. Renal fxn about the same.  8/10: Resting currently.  Got confused with Ambien  last night.  Pressor requirement starting to head back down since we have stopped the dobutamine  8/12: Heparin  started  Interim History / Subjective:  Slept a little bit better last night with melatonin Resting Spoke with spouse at bedside slept a little bit better   Objective    Blood pressure (!) 91/58, pulse 95, temperature 98.7 F (37.1 C), temperature source Axillary, resp. rate (!) 28, height 6' (1.829 m), weight 79.1 kg, SpO2 95%.        Intake/Output Summary (Last 24 hours) at 05/14/2024 9277 Last data filed at 05/14/2024 0700 Gross per 24 hour  Intake 2408.63 ml  Output 2185 ml  Net 223.63 ml   Filed Weights   05/12/24 0500 05/13/24 0904 05/14/24 0500  Weight: 79.6 kg 79.8 kg 79.1 kg   Examination: General: Elderly, chronically ill-appearing HEENT: Swelling right side of neck Pulmonary: Decreased air entry at the bases Cardiac: Irregularly irregular pulse, S1-S2 appreciated Abdomen soft, bowel sounds appreciated Extremities: Skin is warm and dry Neuro: Awake alert interactive GU clear yellow.  I reviewed last 24 h vitals and pain scores, last 48 h intake and output, last 24 h labs and trends,  and last 24 h imaging results. BUN/creatinine stable, H&H stable Started heparin  Resolved problem list   Septic shock   Assessment and Plan  28 male with recently diagnosed tonsillar cancer on chemoradiation last chemotherapy last Thursday that is 7/31.  Neutropenia improved with GSF.  Also has history of CHF with EF less than 20% with CRT-D, A-fib on Eliquis  at home, CAD, bioprosthetic MVR, CKD, DM 2 and hypothyroidism.  Mixed picture  shock Concern for sepsis, cardiogenic shock, ejection fraction of 20% with global hypokinesis -Heart failure team continues to follow - Not a candidate for GDMT - Completed course of Zosyn  -Still remains on Levophed , 3 mcg/min -Unfortunately, has not been able, at present  Atrial fibrillation Rate is controlled - Heparin  started 05/13/2024 - No evidence of new bleed  Acute kidney injury on chronic kidney disease - Maintain renal perfusion Renal dose medications - BUN/creatinine appears stable  Urinary retention - Foley in place  Pancytopenia - Resolved - Filgrastim  administered 8/7  Type 2 diabetes - On SSI - Goal sugars of 140-180 - Sugars fluctuating some, being addressed  Hypothyroidism - Synthroid   Tonsillar cancer, was on chemoradiation - PEG tube in place - Pain management - Holding off on further treatment  Oral thrush - On Diflucan   Tolerating ice chips  May be time to consider palliative care involvement  Best Practice (right click and Reselect all SmartList Selections daily)   Diet/type: tubefeeds DVT prophylaxis SCD Pressure ulcer(s): N/A GI prophylaxis: PPI.  Lines: yes and it is still needed Foley:  Yes, and it is still needed Code Status:  full code Last date of multidisciplinary goals of care discussion [wife at bedside and regularly being updated about the plan updated 05/14/2024.  All questions answered.  The patient is critically ill with multiple organ systems failure and requires high complexity decision making for assessment and support, frequent evaluation and titration of therapies, application of advanced monitoring technologies and extensive interpretation of multiple databases. Critical Care Time devoted to patient care services described in this note independent of APP/resident time (if applicable)  is 32 minutes.   Jennet Epley MD [facility] Personal pager: See Amion If unanswered, please page CCM  On-call: #[phone]

## 2024-05-15 ENCOUNTER — Inpatient Hospital Stay: Admitting: Oncology

## 2024-05-15 ENCOUNTER — Encounter (HOSPITAL_COMMUNITY)
Admission: EM | Disposition: A | Discharge status: Home Health | Payer: Self-pay | Source: Home / Self Care | Attending: Pulmonary Disease

## 2024-05-15 ENCOUNTER — Ambulatory Visit

## 2024-05-15 ENCOUNTER — Inpatient Hospital Stay (HOSPITAL_COMMUNITY): Payer: Self-pay | Admitting: Anesthesiology

## 2024-05-15 ENCOUNTER — Inpatient Hospital Stay

## 2024-05-15 ENCOUNTER — Inpatient Hospital Stay (HOSPITAL_COMMUNITY)

## 2024-05-15 DIAGNOSIS — E039 Hypothyroidism, unspecified: Secondary | ICD-10-CM | POA: Diagnosis not present

## 2024-05-15 DIAGNOSIS — I4891 Unspecified atrial fibrillation: Secondary | ICD-10-CM | POA: Diagnosis not present

## 2024-05-15 DIAGNOSIS — N179 Acute kidney failure, unspecified: Secondary | ICD-10-CM | POA: Diagnosis not present

## 2024-05-15 DIAGNOSIS — I252 Old myocardial infarction: Secondary | ICD-10-CM | POA: Diagnosis not present

## 2024-05-15 DIAGNOSIS — I2510 Atherosclerotic heart disease of native coronary artery without angina pectoris: Secondary | ICD-10-CM

## 2024-05-15 DIAGNOSIS — K625 Hemorrhage of anus and rectum: Secondary | ICD-10-CM | POA: Diagnosis not present

## 2024-05-15 DIAGNOSIS — R579 Shock, unspecified: Secondary | ICD-10-CM | POA: Diagnosis not present

## 2024-05-15 HISTORY — PX: CARDIOVERSION: EP1203

## 2024-05-15 HISTORY — PX: TRANSESOPHAGEAL ECHOCARDIOGRAM (CATH LAB): EP1270

## 2024-05-15 LAB — BASIC METABOLIC PANEL WITH GFR
Anion gap: 5 (ref 5–15)
BUN: 69 mg/dL — ABNORMAL HIGH (ref 8–23)
CO2: 31 mmol/L (ref 22–32)
Calcium: 8.8 mg/dL — ABNORMAL LOW (ref 8.9–10.3)
Chloride: 104 mmol/L (ref 98–111)
Creatinine, Ser: 2.31 mg/dL — ABNORMAL HIGH (ref 0.61–1.24)
GFR, Estimated: 29 mL/min — ABNORMAL LOW (ref >60)
Glucose, Bld: 132 mg/dL — ABNORMAL HIGH (ref 70–99)
Potassium: 4 mmol/L (ref 3.5–5.1)
Sodium: 140 mmol/L (ref 135–145)

## 2024-05-15 LAB — GLUCOSE, CAPILLARY
Glucose-Capillary: 167 mg/dL — ABNORMAL HIGH (ref 70–99)
Glucose-Capillary: 134 mg/dL — ABNORMAL HIGH (ref 70–99)
Glucose-Capillary: 167 mg/dL — ABNORMAL HIGH (ref 70–99)
Glucose-Capillary: 189 mg/dL — ABNORMAL HIGH (ref 70–99)
Glucose-Capillary: 182 mg/dL — ABNORMAL HIGH (ref 70–99)
Glucose-Capillary: 127 mg/dL — ABNORMAL HIGH (ref 70–99)

## 2024-05-15 LAB — CBC
HCT: 32.7 % — ABNORMAL LOW (ref 39.0–52.0)
Hemoglobin: 10.5 g/dL — ABNORMAL LOW (ref 13.0–17.0)
MCH: 30.6 pg (ref 26.0–34.0)
MCHC: 32.1 g/dL (ref 30.0–36.0)
MCV: 95.3 fL (ref 80.0–100.0)
Platelets: 137 K/uL — ABNORMAL LOW (ref 150–400)
RBC: 3.43 MIL/uL — ABNORMAL LOW (ref 4.22–5.81)
RDW: 13.7 % (ref 11.5–15.5)
WBC: 10.8 K/uL — ABNORMAL HIGH (ref 4.0–10.5)
nRBC: 0 % (ref 0.0–0.2)

## 2024-05-15 SURGERY — TRANSESOPHAGEAL ECHOCARDIOGRAM (TEE) (CATHLAB)
Anesthesia: Monitor Anesthesia Care

## 2024-05-15 MED ORDER — LIDOCAINE 2% (20 MG/ML) 5 ML SYRINGE
INTRAMUSCULAR | Status: DC | PRN
  Administered 2024-05-15: 30 mg via INTRAVENOUS

## 2024-05-15 MED ORDER — APIXABAN 5 MG PO TABS
5.0000 mg | ORAL_TABLET | Freq: Two times a day (BID) | ORAL | Status: DC
  Administered 2024-05-15 – 2024-05-19 (×9): 5 mg
  Filled 2024-05-15 (×9): qty 1

## 2024-05-15 MED ORDER — SODIUM CHLORIDE 0.9 % IV SOLN
INTRAVENOUS | Status: DC | PRN
Start: 2024-05-15 — End: 2024-05-15

## 2024-05-15 MED ORDER — LIDOCAINE VISCOUS HCL 2 % MT SOLN
OROMUCOSAL | Status: AC
  Filled 2024-05-15: qty 15

## 2024-05-15 MED ORDER — PHENYLEPHRINE HCL-NACL 20-0.9 MG/250ML-% IV SOLN
INTRAVENOUS | Status: DC | PRN
  Administered 2024-05-15 (×2): 80 ug via INTRAVENOUS

## 2024-05-15 MED ORDER — ETOMIDATE 2 MG/ML IV SOLN
INTRAVENOUS | Status: DC | PRN
  Administered 2024-05-15 (×5): 2 mg via INTRAVENOUS

## 2024-05-15 SURGICAL SUPPLY — 1 items: PAD DEFIB RADIO PHYSIO CONN (PAD) ×1 IMPLANT

## 2024-05-15 NOTE — Plan of Care (Signed)

## 2024-05-15 NOTE — CV Procedure (Signed)
   Brief TEE and DCCV Note  Indication: AF  There was modest difficulty getting probe down due to neck anatomy  Once probe down patient with difficulty managing secretions.  TEE images limited. Due to poor windows and respiratory difficulty  There is severe LV dysfunction with markedly dilated LV EF < 15%  No clot in LAA  DC-CV performed at 200J with conversion to NSR with PACs/PVCs. Rhythm confirmed by device rep at end of procedure.   Toribio Fuel, MD  10:38 AM

## 2024-05-15 NOTE — Progress Notes (Signed)
 Chaplain met with pt Christian Bowman and his wife Christian Bowman - provided compassionate presence, reflective listening and prayer.

## 2024-05-15 NOTE — Progress Notes (Signed)
 Nutrition Follow-up  DOCUMENTATION CODES:  Severe malnutrition in context of acute illness/injury  INTERVENTION:  Resume TF via PEG: Osmolite 1.5 goal 66ml/hr ( per day) 60ml ProSource TF20 BID Provides 2320 kcal, 130g protein, 1097ml free water daily   Continue Nutrisource fiber BID as bulking agent.    Consider transitioning to bolus tube feeds prior to discharge if aligns with patient medical goals of care.  Monitor MBS results and adjust nutrition recommendations per GOC.  NUTRITION DIAGNOSIS:  Severe Malnutrition related to acute illness (tonsillar cancer) as evidenced by energy intake < or equal to 50% for > or equal to 5 days, severe muscle depletion. - remains applicable  GOAL:  Patient will meet greater than or equal to 90% of their needs - goal met via TF  MONITOR:  Labs, Weight trends, TF tolerance, I & O's  REASON FOR ASSESSMENT:  Consult Enteral/tube feeding initiation and management  ASSESSMENT:  Pt presented with nausea, vomiting, diarrhea and rectal bleeding. 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  Tube feeds held overnight for TEE/DCCV.  Continue to remain on pressor support for hypotension.   Per SLP planning for MBS on Friday to assess swallow function.   Will continue with current nutrition interventions and monitor for plan of care and pt wishes regarding continuing treatment.   Admit weight: ? 86 kg First measured weight: 83.8 kg Current weight: 78.9 kg  Medications: diflucan , SSI 0-15 units q4h, 8 units q4h novolog , semglee  30 units daily, torsemide  20mg  daily Drips: Levo @ 6 mcg/min  Labs: BUN 69 Cr 2.31 GFR 29 CBG's 121-186 x24 hours  Diet Order:   Diet Order             Diet NPO time specified Except for: Other (See Comments)  Diet effective now                    EDUCATION NEEDS:  Education needs have been addressed  Skin:  Skin Assessment: Skin Integrity Issues: Skin Integrity Issues:: Stage I Stage I: mid sacrum  Last BM:  8/14 type 7 large  Height:  Ht Readings from Last 1 Encounters:  05/13/24 6' (1.829 m)    Weight:  Wt Readings from Last 1 Encounters:  05/15/24 78.9 kg   BMI:  Body mass index is 23.59 kg/m.  Estimated Nutritional Needs:   Kcal:  2300-2500  Protein:  120-135g  Fluid:  >/=2L  [name], RDN, LDN Clinical Nutrition See AMiON for contact information.

## 2024-05-15 NOTE — Interval H&P Note (Signed)
 History and Physical Interval Note:  05/15/2024 9:53 AM  Christian Bowman  has presented today for surgery, with the diagnosis of afib.  The various methods of treatment have been discussed with the patient and family. After consideration of risks, benefits and other options for treatment, the patient has consented to  Procedure(s): TRANSESOPHAGEAL ECHOCARDIOGRAM (N/A) CARDIOVERSION (N/A) as a surgical intervention.  The patient's history has been reviewed, patient examined, no change in status, stable for surgery.  I have reviewed the patient's chart and labs.  Questions were answered to the patient's satisfaction.     Shahir Karen

## 2024-05-15 NOTE — Progress Notes (Signed)
 NAME:  Christian Bowman, MRN:  [id number], DOB:  [birth year], LOS: 7 ADMISSION DATE:  05/06/2024 CHIEF COMPLAINT:  NV diarrhea.    History of Present Illness:  Christian Bowman is a 78 y.o. 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  He presented to [facility] ED on 8/5 with rectal bleeding.   He was admitted by the hospitalist team and was evaluated by GI on 8/6.  Was felt that his rectal bleeding was most likely secondary to hemorrhoids for which medical management was recommended.   Over the course of the day, he had ongoing hypotension despite multiple fluid boluses.  Mental status had remained the same and he was essentially asymptomatic.  He had had 2 BMs prior in the day that was somewhat loose but not overt diarrhea and no true hematochezia.  He had had low-grade fever early in the morning but had resolved after 1 dose of Tylenol .  His Coreg  and Flomax  was stopped, he was given additional gentle IV fluids, albumin  as well as started on midodrine .  H&H remained stable (11/32).  Echocardiogram has been ordered and is pending.  Lactic acid 1.9, Pro-Cal 2.79.   Unfortunately later in the afternoon, he remained hypotensive (systolics in the 80s) and PCCM was subsequently called to see in consultation. Hospital course   8/6 admitted, seen by GI 8/7 pccm asked to see for shock 8/8: Chest x-ray: Worsening right middle lung opacity could be due to pulmonary congestion versus pneumonia.POCUS done: Some B-lines more on the left.  No consolidation seen on lung POCUS.  Cardiac POCUS showing severely reduced EF.  IVC around 2.5 cm with without  much respiratory variation. 8/9 added Epi. Was already on fairly high dose norepi. No sig decrease on Norepi needs. Scr rising a little w/ on-going lasix . Changed to dobut in early evening. Pressors needs increased after I added dobut. Co-ox actually down. Renal fxn about the same.  8/10: Resting currently.  Got confused with Ambien  last night.  Pressor requirement starting to head back down since we have stopped the dobutamine  8/12: Heparin  started 8/14: On midodrine , still requiring Levophed   Interim History / Subjective:  Slept better last night Feels well today For cardioversion today  Objective    Blood pressure (!) 99/35, pulse 63, temperature 98.2 F (36.8 C), temperature source Axillary, resp. rate (!) 32, height 6' (1.829 m), weight 78.9 kg, SpO2 96%.        Intake/Output Summary (Last 24 hours) at 05/15/2024 0749 Last data filed at 05/15/2024 0600 Gross per 24 hour  Intake 1196.52 ml  Output 550 ml  Net 646.52 ml   Filed Weights   05/13/24 0904 05/14/24 0500 05/15/24 0500  Weight: 79.8 kg 79.1 kg 78.9 kg   Examination: General: Elderly, chronically ill-appearing HEENT: Swelling right side of neck Pulmonary: Decreased air entry bilaterally Cardiac: Irregularly irregular pulse, S1-S2 appreciated Abdomen: Soft, bowel sounds appreciated Extremities: Skin is warm and dry Neuro: Alert, awake, interactive  I reviewed last 24 h vitals and pain scores, last 48 h intake and output, last 24 h labs and trends, and last 24 h imaging results.  Resolved problem list  Septic shock   Assessment and Plan  73 male with recently diagnosed tonsillar cancer on chemoradiation last chemotherapy last Thursday that is 7/31.  Neutropenia improved with GSF.  Also has history of CHF with EF less than 20% with CRT-D, A-fib on Eliquis  at home, CAD, bioprosthetic MVR, CKD, DM 2 and hypothyroidism.  Mixed picture shock Concern for septic shock Cardiogenic shock, ejection fraction of 20%  global hypokinesis - Not a candidate for GDMT - Completed course of Zosyn  - Remains on Levophed  - Midodrine  added   Atrial fibrillation Rate is controlled - Heparin  started 05/13/2024 - For cardioversion today  Acute kidney injury on chronic kidney disease - Maintain renal perfusion - Renal dose medications  Foley in place for urinary retention  Pancytopenia resolved - Received filgrastim   Type 2 diabetes - Goal sugars of 140-180 - Adjust insulin  as needed  Tonsillar cancer, was on chemoradiation - PEG tube in place - Pain management - Patient holding off on further treatment  On Diflucan  for thrush  Tolerating ice chips   Best Practice (right click and Reselect all SmartList Selections daily)   Diet/type: tubefeeds DVT prophylaxis SCD Pressure ulcer(s): N/A GI prophylaxis: PPI.  Lines: yes and it is still needed Foley:  Yes, and it is still needed Code Status:  full code Last date of multidisciplinary goals of care discussion [wife at bedside and regularly being updated about the plan updated 05/14/2024.  All questions answered.  The patient is critically ill with multiple organ systems failure and requires high complexity decision making for assessment and support, frequent evaluation and titration of therapies, application of advanced monitoring technologies and extensive interpretation of multiple databases. Critical Care Time devoted to patient care services described in this note independent of APP/resident time (if applicable)  is 31 minutes.   Jennet Epley MD [place] [facility] Personal pager: See Amion If unanswered, please page CCM On-call: #[phone]

## 2024-05-15 NOTE — Progress Notes (Addendum)
 Advanced Heart Failure Rounding Note  HF Cardiologist: Ezra Shuck, MD   Chief Complaint: Neutropenic / Septic Shock  Patient Profile  Christian Bowman is a 78 y.o. 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   Admitted with septic shock w/ component of cardiogenic shock.  Subjective:    Difficulty weaning off NE, currently at 4 mcg/min. Started on midodrine  yesterday to help wean.   Now on PO diuretics. Net + . Weight stable.   Hgb and platelets stable after starting heparin  gtt for Afib, now tolerating Eliquis .   Feels good this morning. He was able to get up to the chair yesterday. Him and his wife had a couple of questions about TEE/DCCV this morning, answered. Denies CP and SOB.   Objective:    Weight Range: 78.9 kg Body mass index is 23.59 kg/m.   Vital Signs:   Temp:  [97.5 F (36.4 C)-98.6 F (37 C)] 98.2 F (36.8 C) (08/14 0330) Pulse Rate:  [51-106] 63 (08/14 0615) Resp:  [12-39] 32 (08/14 0615) BP: (75-122)/(30-92) 99/35 (08/14 0615) SpO2:  [83 %-97 %] 96 % (08/14 0615) Weight:  [78.9 kg] 78.9 kg (08/14 0500) Last BM Date : 05/14/24  Weight change: Filed Weights   05/13/24 0904 05/14/24 0500 05/15/24 0500  Weight: 79.8 kg 79.1 kg 78.9 kg   Intake/Output:  Intake/Output Summary (Last 24 hours) at 05/15/2024 0713 Last data filed at 05/15/2024 0600 Gross per 24 hour  Intake 1196.52 ml  Output 550 ml  Net 646.52 ml    Physical Exam    General:  chronically ill appearing.   Neck: JVD ~7 cm. R neck mass Cor: Regular rate & irregular rhythm. No murmurs. Lungs: clear Extremities: no edema  Neuro: alert  & oriented x 3. Affect pleasant.   Telemetry   VP 80s-90s, underlying AF. Runs of NSVT up to 15 beats in duration from yesterday (Personally reviewed)    Labs    CBC Recent Labs    05/14/24 0353 05/15/24 0515  WBC 14.3* 10.8*  HGB 10.9* 10.5*  HCT 33.3* 32.7*  MCV 93.0 95.3  PLT 115* 137*   Basic Metabolic Panel Recent Labs    91/86/74 0353 05/15/24 0515  NA 140 140  K 3.5 4.0  CL 98 104  CO2 28 31  GLUCOSE 274* 132*  BUN 68* 69*  CREATININE 2.41* 2.31*  CALCIUM  9.1 8.8*  MG 1.8  --    Liver Function Tests Recent Labs    05/12/24 2058  AST 32  ALT 39  ALKPHOS 78  BILITOT 1.3*  PROT 5.6*  ALBUMIN  2.1*   BNP (last 3 results) Recent Labs    05/08/24 0424 05/09/24 0401 05/11/24 0813  BNP 3,366.2* 3,731.2* 4,153.6*   Imaging   No results found.  Medications:    Scheduled Medications:  apixaban   5 mg Per Tube BID   Chlorhexidine  Gluconate Cloth  6 each Topical Daily   feeding supplement (PROSource TF20)  60 mL Per Tube BID   fiber  1 packet Per Tube BID   fluconazole   100 mg Per Tube Daily   hydrocortisone    Rectal BID   insulin  aspart  0-15  Units Subcutaneous Q4H   insulin  aspart  8 Units Subcutaneous Q4H   insulin  glargine-yfgn  30 Units Subcutaneous Daily   levothyroxine   50 mcg Per Tube QAC breakfast   melatonin  3 mg Oral QHS   midodrine   10 mg Oral TID   pantoprazole  (PROTONIX ) IV  40 mg Intravenous Q24H   rosuvastatin   10 mg Per Tube Daily   sodium chloride  flush  10-40 mL Intracatheter Q12H   sodium chloride  flush  3 mL Intravenous Q12H   torsemide   20 mg Per Tube Daily    Infusions:  feeding supplement (OSMOLITE 1.5 CAL) Stopped (05/15/24 0001)   norepinephrine  (LEVOPHED ) Adult infusion 2 mcg/min (05/15/24 0600)    PRN Medications: acetaminophen  **OR** acetaminophen , loperamide , ondansetron  (ZOFRAN ) IV, oxyCODONE , senna-docusate, sodium chloride  flush  Assessment/Plan   1. Neutropenic / Septic Shock  - likely primarily  septic physiology with component of cardiogenic shock - PCT 9.19. Bcx x 2 negative. - Afebrile. WBC 1.7>6>12.9>18.5>10.8 - ABX per CCM - pressor requirment significantly improved but back up a little.  - Off Vaso. Currently on 4 NE. Wean as able. Target SBP ~ 100, MAP goal difficult d/t wide pulse pressure. Continue midodrine  10 TID. May need to increase to 15mg  TID   2. Acute on chronic systolic CHF: Ischemic cardiomyopathy.  Medtronic CRT-D.  TEE 10/17 with EF 20-25%.  Echo 3/21 EF 20-25%, nl RV, nl bioprosthetic MVR.  CPX 3/21 severe functional impairment due to HF with concern for poor short-term prognosis.  RHC in 4/21 showed normal filling pressures but low cardiac index. Last echo 9/24 EF < 20% with global hypokinesis. Echo this admission unchanged. He is not a transplant candidate.  He has had 2 prior sternotomies, which makes LVAD more complicated.  We discussed LVAD extensively.  He is very clear that he does not want another cardiac surgery.  His goal is to be able to return home.  - off GDMT with shock  - Wean NE as able. Midodrine  as above to assist with pressor wean. - Continue Torsemide  20 mg daily. - BiV pacing down to 83% with recurrent AF. Would like to get back in SR. See below.   3. Tonsillar CA (HPV +) - currently getting chemo and XRT - Dr. Timmy following. Feels there is strong chance of cure. Despite this he expresses that he does not want to continue treatment and wishes to focus on quality of life.  Has had a hard time tolerating treatment.    4. CAD: s/p redo CABG.  Admission in 3/18 with NSTEMI, LHC showed occlusion of SVG-PDA from CABG#1 but patent SVG-PDA from CABG#2, no intervention.  He had a post-op NSTEMI after inguinal hernia repair in 11/18.  LHC showed occlusion of a PLV branch that had been backfilled by SVG-PDA (prior cath had shown severe diffuse disease in the PLV).  - no current s/s angina  - on statin   5. Bioprosthetic mitral valve: stable   6.  Atrial flutter/fibrillation: s/p flutter ablation in 8/18.  In 4/25, runs of atrial fibrillation were seen by device interrogation and apixaban  was started.  - Device interrogated. Appears that he went back into AF on 08/07. Decreased BiV pacing to 83%. Ideally get back in SR. - Heparin  started 08/12. Now on Eliquis . Plan TEE/DCCV today. TEE may carry increased risk w/ recent radiation to neck.  Informed Consent   Shared Decision Making/Informed Consent   The risks [stroke, cardiac arrhythmias rarely resulting in the need for a temporary  or permanent pacemaker, skin irritation or burns, esophageal damage, perforation (1:10,000 risk), bleeding, pharyngeal hematoma as well as other potential complications associated with conscious sedation including aspiration, arrhythmia, respiratory failure and death], benefits (treatment guidance, restoration of normal sinus rhythm, diagnostic support) and alternatives of a transesophageal echocardiogram guided cardioversion were discussed in detail with Mr. Fickle and he is willing to proceed.   7. AKI on CKD: Stage III - Scr 1.5 at baseline - Scr averaging low 2s this admit - suspect ATN in setting of shock  8. NSVT - suspect d/t advanced heart failure w/ volume overload - diuresis as above - Keep K > 4 and Mag > 2, supp K and Mag today   Continue to wean NE as able. TEE/DCCV today. Aim to try to get home within next few days as this is his main priority. PT/OT following to help anticipate discharge needs. Was doing cardiac rehab PTA 2x/week.   CRITICAL CARE Performed by: Beckey LITTIE Coe   Total critical care time: 12 minutes  Critical care time was exclusive of separately billable procedures and treating other patients.  Critical care was necessary to treat or prevent imminent or life-threatening deterioration.  Critical care was time spent personally by me on the following activities: development of treatment plan with patient and/or surrogate as  well as nursing, discussions with consultants, evaluation of patient's response to treatment, examination of patient, obtaining history from patient or surrogate, ordering and performing treatments and interventions, ordering and review of laboratory studies, ordering and review of radiographic studies, pulse oximetry and re-evaluation of patient's condition.    Length of Stay: 7  Beckey LITTIE Coe, NP  05/15/2024, 7:13 AM  Advanced Heart Failure Team Pager [phone] (M-F; 7a - 5p)  Please contact [facility] for night-coverage after hours (5p -7a ) and weekends on [email or web address]  Agree with above.   NE dose escalated over night. Feels weak. Underwent TEE/DC-CV today. LV severely dilated EF < 15%  General: Weak appearing.. No resp difficulty HEENT: normal Neck: supple.large neck cyst Cor: Regular rate & rhythm. No rubs, gallops or murmurs. Lungs: clear Abdomen: soft, nontender, nondistended. +PEG tube No bruits or masses. Good bowel sounds. Extremities: no cyanosis, clubbing, rash, edema Neuro: alert & orientedx3, cranial nerves grossly intact. moves all 4 extremities w/o difficulty. Affect pleasant  He is very tenuous. Remains dependent on NE. TEE reveals severe LV dysfunction and likely nearing end-stage HF. Hopefully cardiac output will improve with return of NSR and will allow us  to wean NE.   Continue Eliquis .   He is eager to get home as soon as possible.   CRITICAL CARE Performed by: Cherrie Sieving  Total critical care time: 45 minutes  Critical care time was exclusive of separately billable procedures and treating other patients.  Critical care was necessary to treat or prevent imminent or life-threatening deterioration.  Critical care was time spent personally by me (independent of midlevel providers or residents) on the following activities: development of treatment plan with patient and/or surrogate as well as nursing, discussions with consultants, evaluation of patient's  response to treatment, examination of patient, obtaining history from patient or surrogate, ordering and performing treatments and interventions, ordering and review of laboratory studies, ordering and review of radiographic studies, pulse oximetry and re-evaluation of patient's condition.  Sieving Cherrie, MD  5:44 PM

## 2024-05-15 NOTE — Transfer of Care (Signed)
 Immediate Anesthesia Transfer of Care Note  Patient: Christian Bowman  Procedure(s) Performed: TRANSESOPHAGEAL ECHOCARDIOGRAM CARDIOVERSION  Patient Location: PACU  Anesthesia Type:MAC  Level of Consciousness: sedated  Airway & Oxygen Therapy: Patient Spontanous Breathing and Patient connected to nasal cannula oxygen  Post-op Assessment: Report given to RN and Post -op Vital signs reviewed and stable  Post vital signs: Reviewed and stable  Last Vitals:  Vitals Value Taken Time  BP 118/68 05/15/24 10:21  Temp 36.5 C 05/15/24 10:21  Pulse 94 05/15/24 10:25  Resp 22 05/15/24 10:25  SpO2 96 % 05/15/24 10:25  Vitals shown include unfiled device data.  Last Pain:  Vitals:   05/15/24 1021  TempSrc: Tympanic  PainSc: Asleep      Patients Stated Pain Goal: 0 (05/11/24 2116)  Complications: No notable events documented.

## 2024-05-16 ENCOUNTER — Ambulatory Visit

## 2024-05-16 ENCOUNTER — Encounter (HOSPITAL_COMMUNITY): Payer: Self-pay | Admitting: Internal Medicine

## 2024-05-16 ENCOUNTER — Inpatient Hospital Stay (HOSPITAL_COMMUNITY)

## 2024-05-16 ENCOUNTER — Inpatient Hospital Stay: Admitting: Nutrition

## 2024-05-16 DIAGNOSIS — E119 Type 2 diabetes mellitus without complications: Secondary | ICD-10-CM | POA: Diagnosis not present

## 2024-05-16 DIAGNOSIS — I5023 Acute on chronic systolic (congestive) heart failure: Secondary | ICD-10-CM | POA: Diagnosis not present

## 2024-05-16 DIAGNOSIS — R579 Shock, unspecified: Secondary | ICD-10-CM | POA: Diagnosis not present

## 2024-05-16 DIAGNOSIS — N179 Acute kidney failure, unspecified: Secondary | ICD-10-CM | POA: Diagnosis not present

## 2024-05-16 DIAGNOSIS — I4891 Unspecified atrial fibrillation: Secondary | ICD-10-CM | POA: Diagnosis not present

## 2024-05-16 LAB — GLUCOSE, CAPILLARY
Glucose-Capillary: 182 mg/dL — ABNORMAL HIGH (ref 70–99)
Glucose-Capillary: 238 mg/dL — ABNORMAL HIGH (ref 70–99)
Glucose-Capillary: 171 mg/dL — ABNORMAL HIGH (ref 70–99)
Glucose-Capillary: 171 mg/dL — ABNORMAL HIGH (ref 70–99)
Glucose-Capillary: 85 mg/dL (ref 70–99)
Glucose-Capillary: 214 mg/dL — ABNORMAL HIGH (ref 70–99)

## 2024-05-16 LAB — CBC
HCT: 32.4 % — ABNORMAL LOW (ref 39.0–52.0)
Hemoglobin: 10.6 g/dL — ABNORMAL LOW (ref 13.0–17.0)
MCH: 30.9 pg (ref 26.0–34.0)
MCHC: 32.7 g/dL (ref 30.0–36.0)
MCV: 94.5 fL (ref 80.0–100.0)
Platelets: 163 K/uL (ref 150–400)
RBC: 3.43 MIL/uL — ABNORMAL LOW (ref 4.22–5.81)
RDW: 14 % (ref 11.5–15.5)
WBC: 8 K/uL (ref 4.0–10.5)
nRBC: 0 % (ref 0.0–0.2)

## 2024-05-16 LAB — MAGNESIUM: Magnesium: 2.1 mg/dL (ref 1.7–2.4)

## 2024-05-16 LAB — BASIC METABOLIC PANEL WITH GFR
Anion gap: 12 (ref 5–15)
BUN: 69 mg/dL — ABNORMAL HIGH (ref 8–23)
CO2: 27 mmol/L (ref 22–32)
Calcium: 8.7 mg/dL — ABNORMAL LOW (ref 8.9–10.3)
Chloride: 100 mmol/L (ref 98–111)
Creatinine, Ser: 2.15 mg/dL — ABNORMAL HIGH (ref 0.61–1.24)
GFR, Estimated: 31 mL/min — ABNORMAL LOW (ref >60)
Glucose, Bld: 199 mg/dL — ABNORMAL HIGH (ref 70–99)
Potassium: 3.8 mmol/L (ref 3.5–5.1)
Sodium: 139 mmol/L (ref 135–145)

## 2024-05-16 MED ORDER — VITAL 1.5 CAL PO LIQD
1000.0000 mL | ORAL | Status: DC
  Administered 2024-05-16 – 2024-05-18 (×3): 1000 mL
  Filled 2024-05-16 (×6): qty 1000

## 2024-05-16 MED ORDER — MIDODRINE HCL 5 MG PO TABS: 15.0000 mg | ORAL_TABLET | Freq: Three times a day (TID) | ORAL | Status: DC

## 2024-05-16 MED ORDER — BETHANECHOL CHLORIDE 10 MG PO TABS
10.0000 mg | ORAL_TABLET | Freq: Three times a day (TID) | ORAL | Status: DC
  Administered 2024-05-16 – 2024-05-19 (×9): 10 mg
  Filled 2024-05-16 (×9): qty 1

## 2024-05-16 MED ORDER — MELATONIN 3 MG PO TABS
3.0000 mg | ORAL_TABLET | Freq: Every day | ORAL | Status: DC
  Administered 2024-05-16 – 2024-05-18 (×3): 3 mg
  Filled 2024-05-16 (×3): qty 1

## 2024-05-16 MED ORDER — BETHANECHOL CHLORIDE 10 MG PO TABS: 10.0000 mg | ORAL_TABLET | Freq: Three times a day (TID) | ORAL | Status: DC

## 2024-05-16 MED ORDER — SENNOSIDES-DOCUSATE SODIUM 8.6-50 MG PO TABS: 1.0000 | ORAL_TABLET | Freq: Every evening | ORAL | Status: DC | PRN

## 2024-05-16 MED ORDER — INSULIN GLARGINE-YFGN 100 UNIT/ML ~~LOC~~ SOLN
30.0000 [IU] | Freq: Every day | SUBCUTANEOUS | Status: DC
  Administered 2024-05-16 – 2024-05-19 (×4): 30 [IU] via SUBCUTANEOUS
  Filled 2024-05-16 (×4): qty 0.3

## 2024-05-16 MED ORDER — POTASSIUM CHLORIDE 20 MEQ PO PACK
40.0000 meq | PACK | Freq: Once | ORAL | Status: AC
  Administered 2024-05-16: 40 meq
  Filled 2024-05-16: qty 2

## 2024-05-16 MED ORDER — MIDODRINE HCL 5 MG PO TABS
15.0000 mg | ORAL_TABLET | Freq: Three times a day (TID) | ORAL | Status: DC
  Administered 2024-05-16 – 2024-05-19 (×9): 15 mg
  Filled 2024-05-16 (×9): qty 3

## 2024-05-16 MED ORDER — INSULIN GLARGINE-YFGN 100 UNIT/ML ~~LOC~~ SOLN: 35.0000 [IU] | Freq: Every day | SUBCUTANEOUS | Status: DC

## 2024-05-16 MED ORDER — LOPERAMIDE HCL 1 MG/7.5ML PO SUSP
2.0000 mg | ORAL | Status: DC | PRN
  Administered 2024-05-16: 2 mg
  Filled 2024-05-16: qty 15

## 2024-05-16 MED ORDER — MIDODRINE HCL 5 MG PO TABS
5.0000 mg | ORAL_TABLET | ORAL | Status: AC
  Administered 2024-05-16: 5 mg via ORAL
  Filled 2024-05-16: qty 1

## 2024-05-16 NOTE — Anesthesia Postprocedure Evaluation (Signed)
 Anesthesia Post Note  Patient: [name]  Procedure(s) Performed: TRANSESOPHAGEAL ECHOCARDIOGRAM CARDIOVERSION     Patient location during evaluation: Cath Lab Anesthesia Type: MAC Level of consciousness: awake and alert Pain management: pain level controlled Vital Signs Assessment: post-procedure vital signs reviewed and stable Respiratory status: spontaneous breathing Cardiovascular status: stable Anesthetic complications: no   No notable events documented.  Last Vitals:  Vitals:   05/16/24 0715 05/16/24 0757  BP: (!) 107/50   Pulse: 91   Resp: (!) 25   Temp:  37.1 C  SpO2: 94%     Last Pain:  Vitals:   05/16/24 0757  TempSrc: Oral  PainSc:                  Norleen Pope

## 2024-05-16 NOTE — Progress Notes (Signed)
 PHARMACY - ANTICOAGULATION CONSULT NOTE  Pharmacy Consult for apixaban  Indication: atrial fibrillation  Allergies  Allergen Reactions   Xanax [Alprazolam] Other (See Comments)    Pt feels very weak, tired and feels paralyzed     Ambien  [Zolpidem ] Other (See Comments)    Delirium     Patient Measurements: Height: 6' (182.9 cm) Weight: 78.9 kg (173 lb 15.1 oz) IBW/kg (Calculated) : 77.6 HEPARIN  DW (KG): 79.8  Vital Signs: Temp: 98.7 F (37.1 C) (08/15 1133) Temp Source: Oral (08/15 1133) BP: 94/58 (08/15 1000) Pulse Rate: 68 (08/15 1000)  Labs: Recent Labs    05/13/24 2306 05/14/24 0353 05/14/24 0353 05/14/24 0950 05/15/24 0515 05/16/24 0314  HGB  --  10.9*   < >  --  10.5* 10.6*  HCT  --  33.3*  --   --  32.7* 32.4*  PLT  --  115*  --   --  137* 163  APTT 58*  --   --   --   --   --   HEPARINUNFRC 0.27*  --   --  0.33  --   --   CREATININE  --  2.41*  --   --  2.31* 2.15*   < > = values in this interval not displayed.    Estimated Creatinine Clearance: 32.1 mL/min (A) (by C-G formula based on SCr of 2.15 mg/dL (H)).   Medical History: Past Medical History:  Diagnosis Date   AICD (automatic cardioverter/defibrillator) present    medtronic-   DR. CROITORU , DR. ROLAN    Anginal pain (HCC)    cp sat 08/11/17   Anxiety    CAD (coronary artery disease)    CHF (congestive heart failure) (HCC)    Complication of anesthesia    took awhile to wake up    Coronary artery disease involving coronary bypass graft    Cyst of neck    right side   DM2 (diabetes mellitus, type 2) (HCC) 08/26/2013   Dyspnea    Heart attack (HCC)    not sure when (08/20/2017)   HTN (hypertension) 08/26/2013   Hyperlipidemia 08/26/2013   Hypothyroidism    Left main coronary artery disease    Left renal artery stenosis (HCC)    Genesis 6x12 stent 2007   Localized cancer of throat (HCC)    Obesity (BMI 30.0-34.9) 08/26/2013   Postoperative atrial fibrillation (HCC) 10/15/2005    Presence of permanent cardiac pacemaker    S/P CABG x 4 10/13/2005   LIMA to LAD, SVG to intermediate branch, sequential SVG to PDA and RPL branch, EVH via right thigh   S/P mitral valve replacement with bioprosthetic valve 07/11/2016   31 mm [facility] Mitral bovine bioprosthetic tissue valve   S/P redo CABG x 2 07/11/2016   SVG to PDA and SVG to Intermediate Branch, EVH via left thigh    Medications:  Scheduled:   apixaban   5 mg Per Tube BID   bethanechol   10 mg Oral TID   Chlorhexidine  Gluconate Cloth  6 each Topical Daily   feeding supplement (PROSource TF20)  60 mL Per Tube BID   fiber  1 packet Per Tube BID   fluconazole   100 mg Per Tube Daily   hydrocortisone    Rectal BID   insulin  aspart  0-15 Units Subcutaneous Q4H   insulin  aspart  8 Units Subcutaneous Q4H   insulin  glargine-yfgn  30 Units Subcutaneous Daily   levothyroxine   50 mcg Per Tube QAC breakfast   melatonin  3 mg Oral QHS   midodrine   15 mg Oral TID   rosuvastatin   10 mg Per Tube Daily   sodium chloride  flush  10-40 mL Intracatheter Q12H   sodium chloride  flush  3 mL Intravenous Q12H   torsemide   20 mg Per Tube Daily   Assessment: Patient is a 78 YO M initially admitted to ICU with febrile neutropenia/septic shock, now with hypotension secondary to HFrEF with LVEF < 15%. PTA Eliquis  for atrial fibrillation was restarted once platelets recovered. Aware of drug interaction between apixaban  and fluconazole ; however the fluconazole  is at a low dose and course ends tomorrow.   Plan:  Continue apixaban  5 mg BID. No plans to hold or dose adjust in the future. Will discontinue pharmacy consult for apixaban  dosing per pharmacy.   Maurilio Patten, PharmD PGY1 Pharmacy Resident [facility] 05/16/2024 1:18 PM

## 2024-05-16 NOTE — Progress Notes (Addendum)
 Advanced Heart Failure Rounding Note  HF Cardiologist: Ezra Shuck, MD   Chief Complaint: Neutropenic / Septic Shock  Patient Profile  Christian Bowman is a 78 y.o. 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   Admitted with septic shock w/ component of cardiogenic shock.  Subjective:    Now stable off NE, was a slow wean. Started on midodrine  8/13 to help wean.   Now on PO diuretics. Net -1.4L. Weight stable.   TEE/DC-CV 8/14. LV severely dilated EF < 15%. Severe LV dysfunction and likely nearing end-stage HF.  Patient resting comfortably in bed, did not sleep much last night so I did not wake him up. Spoke to his wife who helped provide updates. Did not get up yesterday.   Objective:    Weight Range: 78.9 kg Body mass index is 23.59 kg/m.   Vital Signs:   Temp:  [97.6 F (36.4 C)-98.5 F (36.9 C)] 97.6 F (36.4 C) (08/15 0330) Pulse Rate:  [57-95] 83 (08/15 0630) Resp:  [0-41] 34 (08/15 0630) BP: (76-141)/(38-120) 118/52 (08/15 0630) SpO2:  [90 %-99 %] 95 % (08/15 0630) Weight:  [78.9 kg] 78.9 kg (08/15 0500) Last BM Date : 05/15/24  Weight change: Filed Weights   05/14/24 0500 05/15/24 0500 05/16/24 0500  Weight: 79.1 kg 78.9 kg 78.9 kg   Intake/Output:  Intake/Output Summary (Last 24 hours) at 05/16/2024 0708 Last data filed at 05/16/2024 0600 Gross per 24 hour  Intake 1404.83 ml  Output 2800 ml  Net -1395.17 ml    Physical Exam    General:  chronically ill appearing.  No respiratory difficulty Neck: JVD ~7 cm. R neck mass.  Cor: Regular rate & rhythm  Extremities: no edema  Neuro: resting  Telemetry    VP 70s, underlying difficult to tell (check EKG). 3-8 PVCs/hr. (Personally reviewed)    Labs    CBC Recent Labs    05/15/24 0515 05/16/24 0314  WBC 10.8* 8.0  HGB 10.5* 10.6*  HCT 32.7* 32.4*  MCV 95.3 94.5  PLT 137* 163   Basic Metabolic Panel Recent Labs    91/86/74 0353 05/15/24 0515 05/16/24 0314  NA 140 140 139  K 3.5 4.0 3.8  CL 98 104 100  CO2 28 31 27   GLUCOSE 274* 132* 199*  BUN 68* 69* 69*  CREATININE 2.41* 2.31* 2.15*  CALCIUM  9.1 8.8* 8.7*  MG 1.8  --  2.1   Liver Function Tests No results for input(s): AST, ALT, ALKPHOS, BILITOT, PROT, ALBUMIN  in the last 72 hours.  BNP (last 3 results) Recent Labs    05/08/24 0424 05/09/24 0401 05/11/24 0813  BNP 3,366.2* 3,731.2* 4,153.6*   Imaging   EP STUDY Result Date: 05/15/2024 See surgical note for result.   Medications:    Scheduled Medications:  apixaban   5 mg Per Tube BID   Chlorhexidine  Gluconate Cloth  6 each Topical Daily   feeding supplement (PROSource TF20)  60 mL Per Tube BID   fiber  1 packet Per Tube BID   fluconazole   100 mg Per Tube Daily   hydrocortisone    Rectal BID   insulin  aspart  0-15 Units Subcutaneous Q4H  insulin  aspart  8 Units Subcutaneous Q4H   insulin  glargine-yfgn  30 Units Subcutaneous Daily   levothyroxine   50 mcg Per Tube QAC breakfast   melatonin  3 mg Oral QHS   midodrine   10 mg Oral TID   pantoprazole  (PROTONIX ) IV  40 mg Intravenous Q24H   rosuvastatin   10 mg Per Tube Daily   sodium chloride  flush  10-40 mL Intracatheter Q12H   sodium chloride  flush  3 mL Intravenous Q12H   torsemide   20 mg Per Tube Daily    Infusions:  feeding supplement (OSMOLITE 1.5 CAL) 60 mL/hr at 05/16/24 0600   norepinephrine  (LEVOPHED ) Adult infusion Stopped (05/16/24 0340)    PRN Medications: acetaminophen  **OR** acetaminophen , loperamide , ondansetron  (ZOFRAN ) IV, oxyCODONE , senna-docusate, sodium chloride  flush  Assessment/Plan   1. Neutropenic / Septic  Shock  - likely primarily septic physiology with component of cardiogenic shock - PCT 9.19. Bcx x 2 negative. - Afebrile. WBC 1.7>6>12.9>18.5>10.8>8 - ABX per CCM - Off Vaso and NE. MAP goal difficult d/t wide pulse pressure. Continue midodrine  10 TID.   2. Acute on chronic systolic CHF: Ischemic cardiomyopathy.  Medtronic CRT-D.  TEE 10/17 with EF 20-25%.  Echo 3/21 EF 20-25%, nl RV, nl bioprosthetic MVR.  CPX 3/21 severe functional impairment due to HF with concern for poor short-term prognosis.  RHC in 4/21 showed normal filling pressures but low cardiac index. Last echo 9/24 EF < 20% with global hypokinesis. Echo this admission unchanged. He is not a transplant candidate.  He has had 2 prior sternotomies, which makes LVAD more complicated.  We discussed LVAD extensively.  He is very clear that he does not want another cardiac surgery.  His goal is to be able to return home.  - off GDMT with shock  - Now off NE. Midodrine  as above. - Volume stable, continue Torsemide  20 mg daily. - BiV pacing down to 83% with recurrent AF. S/p TEE/DCCV 8/14.    3. Tonsillar CA (HPV +) - currently getting chemo and XRT - Dr. Timmy following. Feels there is strong chance of cure. Despite this he expresses that he does not want to continue treatment and wishes to focus on quality of life.  Has had a hard time tolerating treatment.    4. CAD: s/p redo CABG.  Admission in 3/18 with NSTEMI, LHC showed occlusion of SVG-PDA from CABG#1 but patent SVG-PDA from CABG#2, no intervention.  He had a post-op NSTEMI after inguinal hernia repair in 11/18.  LHC showed occlusion of a PLV branch that had been backfilled by SVG-PDA (prior cath had shown severe diffuse disease in the PLV).  - no current s/s angina  - on statin   5. Bioprosthetic mitral valve: stable   6. Atrial flutter/fibrillation: s/p flutter ablation in 8/18.  In 4/25, runs of atrial fibrillation were seen by device interrogation and apixaban  was started.   - Device interrogated. Appears that he went back into AF on 08/07. Decreased BiV pacing to 83%. Ideally get back in SR. - Heparin  started 08/12. Hgb and platelets stable after starting heparin  gtt for Afib, now tolerating Eliquis . - TEE/DC-CV 8/14. LV severely dilated EF < 15%. Severe LV dysfunction and likely nearing end-stage HF.  7. AKI on CKD: Stage III - Scr 1.5 at baseline - Scr averaging low 2s this admit - suspect ATN in setting of shock  8. NSVT - suspect d/t advanced heart failure w/ volume overload - diuresis as above - Keep K > 4 and Mag >  2, supp K and Mag today   Aim to try to get home within next few days as this is his main priority, did suggest CIR as an option if this is something that he may be interested in. Plan to discuss further later. PT/OT following to help anticipate discharge needs. He was doing cardiac rehab PTA 2x/week. Transfer out of ICU today.    Length of Stay: 8  Beckey LITTIE Coe, NP  05/16/2024, 7:08 AM  Advanced Heart Failure Team Pager [phone] (M-F; 7a - 5p)  Please contact [facility] for night-coverage after hours (5p -7a ) and weekends on [email or web address]   Patient seen and examined with the above-signed Advanced Practice Provider and/or Housestaff. I personally reviewed laboratory data, imaging studies and relevant notes. I independently examined the patient and formulated the important aspects of the plan. I have edited the note to reflect any of my changes or salient points. I have personally discussed the plan with the patient and/or family.  Now off NE. SBP stable. Remains in NSR Feels very weak.   Failed Barium swallow  General:  Weak appearing. No resp difficulty HEENT: normal Neck: supple. no JVD. Large next cyst Cor: PMI nondisplaced. Regular rate & rhythm. No rubs, gallops or murmurs. Lungs: clear Abdomen: soft, nontender, nondistended. No hepatosplenomegaly. No bruits or masses. Good bowel sounds. + PEG Extremities: no cyanosis,  clubbing, rash, edema Neuro: alert & orientedx3, cranial nerves grossly intact. moves all 4 extremities w/o difficulty. Affect pleasant  He remains very tenuous. Now off NE. Remains in NSR. Tolerating eliquis .   We had a long talk about next steps and given his overall prognosis with advanced HF and cancer, we agree that probably the best thing is to get him home as soon as possible (tomorrow) with [facility] support.  Will add amio 200 daily to help maintain NSR.   AHF team will s/o  Toribio Fuel, MD  4:18 PM

## 2024-05-16 NOTE — Plan of Care (Addendum)
 A+OX4. Up to [facility] multiple times, 3 bms. Foley removed @ 1500 and bethanechol  started. Had MBS done. PT/OT worked with pt. CIR consulted. Semglee  started d/t elevated bg. Problem: Education: Goal: Ability to describe self-care measures that may prevent or decrease complications (Diabetes Survival Skills Education) will improve Outcome: Progressing Goal: Individualized Educational Video(s) Outcome: Progressing   Problem: Coping: Goal: Ability to adjust to condition or change in health will improve Outcome: Progressing   Problem: Fluid Volume: Goal: Ability to maintain a balanced intake and output will improve Outcome: Progressing   Problem: Health Behavior/Discharge Planning: Goal: Ability to identify and utilize available resources and services will improve Outcome: Progressing Goal: Ability to manage health-related needs will improve Outcome: Progressing   Problem: Metabolic: Goal: Ability to maintain appropriate glucose levels will improve Outcome: Progressing   Problem: Nutritional: Goal: Maintenance of adequate nutrition will improve Outcome: Progressing Goal: Progress toward achieving an optimal weight will improve Outcome: Progressing   Problem: Skin Integrity: Goal: Risk for impaired skin integrity will decrease Outcome: Progressing   Problem: Tissue Perfusion: Goal: Adequacy of tissue perfusion will improve Outcome: Progressing   Problem: Education: Goal: Knowledge of General Education information will improve Description: Including pain rating scale, medication(s)/side effects and non-pharmacologic comfort measures Outcome: Progressing   Problem: Health Behavior/Discharge Planning: Goal: Ability to manage health-related needs will improve Outcome: Progressing   Problem: Clinical Measurements: Goal: Ability to maintain clinical measurements within normal limits will improve Outcome: Progressing Goal: Will remain free from infection Outcome:  Progressing Goal: Diagnostic test results will improve Outcome: Progressing Goal: Respiratory complications will improve Outcome: Progressing Goal: Cardiovascular complication will be avoided Outcome: Progressing   Problem: Activity: Goal: Risk for activity intolerance will decrease Outcome: Progressing   Problem: Nutrition: Goal: Adequate nutrition will be maintained Outcome: Progressing   Problem: Coping: Goal: Level of anxiety will decrease Outcome: Progressing   Problem: Elimination: Goal: Will not experience complications related to bowel motility Outcome: Progressing Goal: Will not experience complications related to urinary retention Outcome: Progressing   Problem: Pain Managment: Goal: General experience of comfort will improve and/or be controlled Outcome: Progressing   Problem: Safety: Goal: Ability to remain free from injury will improve Outcome: Progressing   Problem: Skin Integrity: Goal: Risk for impaired skin integrity will decrease Outcome: Progressing

## 2024-05-16 NOTE — Evaluation (Signed)
 Modified Barium Swallow Study  Patient Details  Name: Christian Bowman MRN: [id number] Date of Birth: [birth year]  Today's Date: 05/16/2024  Modified Barium Swallow completed.  Full report located under Chart Review in the Imaging Section.  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 GI evaluated 8/6, felt to be related to hemorrhoids. Developed hypotension despite multiple fluid boluses and a low-grade fever, w/u ongoing for septic shock. CXR 8/7 shows streaky bibasilar opacities is favored to reflect pulmonary vascular congestion with suspected interstitial edema, improving on CXR 8/9. Laryngoscopy 02/18/24 shows evidence of large exophytic mass involving the R tonsil and soft palate but without any obvious involvement of the BOT. PET scan 02/28/24 reveals a hypermetabolic mass centered in the R palantine tonsil pharyngeal region extending to the glossal tonsillar sulcus and the soft palate. Seen by OP SLP 04/03/24 without overt s/s of aspiration throughout a meal. Underwent G-tube placement 04/09/24.   Clinical Impression Pt presents with severe pharyngeal dysphagia with a safety grade of 4 (chronic and gross) and efficiency grade of  3 (residue >50%) per the DIGEST.  (Overall severity rating of 4, profound). Deficits are related to significant narrowing of the pharyngoesophageal sphincter, presumably related to edema.  Pyriform space is obliterated, leading to no holding area for POs before, during, and after the swallow.  Only a small volume of material is squeezed through PES.  POs are aspirated both during the swallow and after due to backflow from the cervical esophagus. Laryngeal closure is also mildly compromised, contributing to aspiration events.  Aspiration is sensed, leading to throat-clearing and cough.  Attempted  postural adjustments to help with airway protection, none of which were effective.  Spoke with pt and his wife at length after the study. They verbalized understanding of results of test. Christian Bowman prefers to continue to eat/drink small quantities of POs despite presence of aspiration.  He is debating about continuing with treatment of the cancer. Encouraged Mr. and Christian Bowman to meet with Palliative Medicine to discuss options and quality of life issues.  They are both open to having those discussions. SLP will follow while admitted. Spoke with Dr. Neda re: the above.  Continue purees from floor stock and allow thin liquids per his preferences.  He should take small sips and rest breaks if coughing significantly.    DIGEST Swallow Severity Rating*  Safety: 4  Efficiency:3  Overall Pharyngeal Swallow Severity: 4  1: mild; 2: moderate; 3: severe; 4: profound  *The Dynamic Imaging Grade of Swallowing Toxicity is standardized for the head and neck cancer population, however, demonstrates promising clinical applications across populations to standardize the clinical rating of pharyngeal swallow safety and severity.  Factors that may increase risk of adverse event in presence of aspiration Christian Bowman & Christian Bowman 2021): Aspiration of thick, dense, and/or acidic materials  Swallow Evaluation Recommendations Medication Administration: Via alternative means Oral care recommendations: Oral care QID (4x/day)     Christian Bowman L. Vona, MA CCC/SLP Clinical Specialist - Acute Care SLP Acute Rehabilitation Services Office number [phone]  Christian Bowman 05/16/2024,2:55 PM

## 2024-05-16 NOTE — TOC Initial Note (Signed)
 Transition of Care [facility]) - Initial/Assessment Note    Patient Details  Name: Christian Bowman MRN: [id number] Date of Birth: May 26, 1946  [facility] CM/SW Contact:    [name], RN Phone Number: [phone] 05/16/2024, 4:07 PM  Clinical Narrative:                  Spoke to wife and she is deciding on IP rehab vs HH. Medicare.gov listing provided and placed on chart. Wife requested [facility]. Notified attending HH orders need if dc home.  Waiting final dc disposition.  Pt will need Rollator nd 3n1. Wife will pick up a shower chair from medical supply store.    Expected Discharge Plan: IP Rehab Facility Barriers to Discharge: Continued Medical Work up   Patient Goals and CMS Choice   CMS Medicare.gov Compare Post Acute Care list provided to:: Patient Represenative (must comment) Choice offered to / list presented to : Spouse      Expected Discharge Plan and Services   Discharge Planning Services: CM Consult Post Acute Care Choice: Home Health Living arrangements for the past 2 months: Single Family Home                             HH Agency: [facility]        Prior Living Arrangements/Services Living arrangements for the past 2 months: Single Family Home Lives with:: Spouse   Do you feel safe going back to the place where you live?: Yes               Activities of Daily Living   ADL Screening (condition at time of admission) Independently performs ADLs?: Yes (appropriate for developmental age) Is the patient deaf or have difficulty hearing?: Yes Does the patient have difficulty seeing, even when wearing glasses/contacts?: Yes Does the patient have difficulty concentrating, remembering, or making decisions?: No  Permission Sought/Granted Permission sought to share information with : Case Manager, Family Supports, PCP Permission granted to share information with : Yes, Verbal Permission Granted               Emotional Assessment   Attitude/Demeanor/Rapport: Lethargic          Admission diagnosis:  Rectal bleeding [K62.5] BRBPR (bright red blood per rectum) [K62.5] Patient Active Problem List   Diagnosis Date Noted   Shock (HCC) 05/10/2024   BRBPR (bright red blood per rectum) 05/06/2024   Hyperkalemia 05/06/2024   Leukopenia due to antineoplastic chemotherapy (HCC) 05/06/2024   History of CVA (cerebrovascular accident) 05/06/2024   Port-A-Cath in place 04/10/2024   Chronic kidney disease, stage 3a (HCC) 03/21/2024   Thrombocytopenia (HCC) 03/21/2024   Primary cancer of tonsillar fossa (HCC) 03/04/2024   Mass in neck 02/18/2024   Chronic CHF (congestive heart failure) (HCC) 02/18/2024   Chronic health problem 02/18/2024   Hemoptysis 02/18/2024   Complete AV block (HCC) 09/13/2023   Long term current use of oral hypoglycemic drug 09/13/2023   Hypercholesterolemia 10/11/2018   Type 2 diabetes mellitus with stage 3a chronic kidney disease, without long-term current use of insulin  (HCC) 10/04/2017   Angina decubitus (HCC)    Non-ST elevation (NSTEMI) myocardial infarction (HCC)    Inguinal hernia of left side without obstruction or gangrene 08/20/2017   Atrial flutter (HCC) 05/11/2017   Hypothyroidism (acquired) 02/25/2017   Encounter for monitoring amiodarone  therapy 02/25/2017   Elevated troponin 12/15/2016   Chest pain 12/15/2016  Presence of biventricular implantable cardioverter-defibrillator (ICD) 09/11/2016   Protein-calorie malnutrition, severe 07/24/2016   Acute on chronic systolic CHF (congestive heart failure) (HCC)    S/P redo CABG x 2 07/11/2016   Coronary artery disease involving coronary bypass graft    Left main coronary artery disease    Coronary artery disease of native artery of native heart with stable angina pectoris (HCC)    Vitamin D  deficiency 08/12/2015   Chronic heart failure with reduced ejection fraction (HFrEF, <= 40%) (HCC) 07/28/2015   Mitral  regurgitation 07/28/2015   Left renal artery stenosis (HCC) 08/26/2013   Mixed hyperlipidemia 08/26/2013   Type 2 diabetes mellitus with vascular disease (HCC) 08/26/2013   PCP:  Freddrick No Pharmacy:   [facility] [address] [place] - [address] [place] [address] Phone: [phone] Fax: [phone]  [name] [address] [place] [address] Phone: [phone] Fax: [phone]     Social Drivers of Health (SDOH) Social History: SDOH Screenings   Food Insecurity: No Food Insecurity (05/07/2024)  Housing: Low Risk  (05/07/2024)  Transportation Needs: No Transportation Needs (05/07/2024)  Utilities: Not At Risk (05/07/2024)  Depression (PHQ2-9): Medium Risk (05/01/2024)  Social Connections: Moderately Integrated (05/07/2024)  Tobacco Use: Low Risk  (05/06/2024)   SDOH Interventions: Transportation Interventions: Inpatient TOC   Readmission Risk Interventions     No data to display

## 2024-05-16 NOTE — Progress Notes (Signed)
 NAME:  Christian Bowman, MRN:  [id number], DOB:  [birth year], LOS: 8 ADMISSION DATE:  05/06/2024 CHIEF COMPLAINT:  NV diarrhea.    History of Present Illness:  Christian Bowman is a 78 y.o. 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  He presented to [facility] ED on 8/5 with rectal bleeding.   He was admitted by the hospitalist team and was evaluated by GI on 8/6.  Was felt that his rectal bleeding was most likely secondary to hemorrhoids for which medical management was recommended.   Over the course of the day, he had ongoing hypotension despite multiple fluid boluses.  Mental status had remained the same and he was essentially asymptomatic.  He had had 2 BMs prior in the day that was somewhat loose but not overt diarrhea and no true hematochezia.  He had low-grade fever early in the morning but had resolved after 1 dose of Tylenol .  His Coreg  and Flomax  was stopped, he was given additional gentle IV fluids, albumin  as well as started on midodrine .  H&H remained stable (11/32).  Echocardiogram has been ordered and is pending.  Lactic acid 1.9, Pro-Cal 2.79.   Unfortunately later in the afternoon, he remained hypotensive (systolics in the 80s) and PCCM was subsequently called to see in consultation.  Hospital course   8/6 admitted, seen by GI 8/7 pccm asked to see for shock 8/8: Chest x-ray: Worsening right middle lung opacity could be due to pulmonary congestion versus pneumonia.POCUS done: Some B-lines more on the left.  No consolidation seen on lung POCUS.  Cardiac POCUS showing severely reduced EF.  IVC around 2.5 cm with without  much respiratory variation. 8/9 added Epi. Was already on fairly high dose norepi. No sig decrease on Norepi needs. Scr rising a little w/ on-going lasix . Changed to dobut in early evening. Pressors needs increased after I added dobut. Co-ox actually down. Renal fxn about the same.  8/10: Resting currently.  Got confused with Ambien  last night.  Pressor requirement starting to head back down since we have stopped the dobutamine  8/12: Heparin  started 8/14: On midodrine , still requiring Levophed ; cardioversion 8/15: Off Levo, increase 15 mg Midodrine  TID, may be stable for progressive later today  Interim History / Subjective:  Feeling better with pain today, but continues to be weak. Would like to eat food. Barium swallow likely today.  Objective    Blood pressure (!) 87/59, pulse 70, temperature 98.8 F (37.1 C), temperature source Oral, resp. rate (!) 34, height 6' (1.829 m), weight 78.9 kg, SpO2 94%.        Intake/Output Summary (Last 24 hours) at 05/16/2024 0950 Last data filed at 05/16/2024 0900 Gross per 24 hour  Intake 1578.23 ml  Output 2850 ml  Net -1271.77 ml   Filed Weights   05/14/24 0500 05/15/24 0500 05/16/24 0500  Weight: 79.1 kg 78.9 kg 78.9 kg   Examination: General: Elderly, chronically ill-appearing HEENT: Swelling right side of neck Pulmonary: Decreased air entry bilaterally Cardiac: Irregularly irregular. 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  Neutropenia improved with GSF.  Also has history of CHF with EF <15% with CRT-D, A-fib on Eliquis  at home, CAD, bioprosthetic MVR, CKD, DM 2 and hypothyroidism.  Mixed septic and cardiogenic shock Remained afebrile and WBC improved.  EF ejection fraction of  <15%. - Not a candidate for GDMT 2/2 nearly end-stage HF - Completed course of Zosyn  - Levophed  stopped at 3 AM, pressures up-trended slightly, with some MAPs < 65 and wide pulse pressures - Increase Midodrine  to 15 mg TID   Atrial fibrillation Rate is controlled.  S/p cardioversion on 8/14. Back in A-fib this AM.  - Heparin  started 05/13/2024 - HF and Cardiology consulted, appreciate recommendations  Acute on chronic systolic CHF History of 2 prior sternotomies, making LVAD more complicated, patient would like to avoid another cardiac surgery.  TEE 8/14 demonstrated severely dilated LV, EF less than 15% and nearly end-stage heart failure.  - Continue Demadex  20 mg daily - 3 times daily continue midodrine  as above - Maintain electrolytes - HF and Cardiology consulted, appreciate recommendations  Acute kidney injury on chronic kidney disease Cr remains elevated at 2.15, baseline 1.5; likely secondary to ATN in setting of shock - Maintain renal perfusion - Renal dose medications - Continue trending BMP  T2DM CBG elevated to 230s this morning. - Semglee  30 units daily - NovoLog  8 units every 4 hours - Goal: 140-180 - Custom SSI  HLD - Continue Crestor  10 mg daily  Tonsillar cancer Was on chemoradiation. - PEG tube in place - Pain management - Patient holding off on further treatment  Thrush  Tubefeeds - On Diflucan  100 mg daily - Tolerating ice chips, barium swallow likely today  Best Practice (right click and Reselect all SmartList Selections daily)   Diet/type: tubefeeds DVT prophylaxis SCD Pressure ulcer(s): N/A GI prophylaxis: PPI.  Lines: yes and it is still needed Foley:  Yes, and it is still needed for urinary retention Code Status:  full code Last date of multidisciplinary goals of care discussion [wife at bedside and regularly being updated about the plan updated 05/16/2024.  All questions answered.  Kathrine Melena, DO 05/16/24 9:50 AM

## 2024-05-16 NOTE — Progress Notes (Signed)
 Nutrition Brief Note  Received secure chat from Pharmacy as patient continues with ongoing multiple loose stools daily. Nutrisource fiber was added BID on 8/11 with no improvement. Will adjust formula to a semi-elemental formula to see if this aids in slowing down stools.   Also of note, patient is going for MBS today.   New tube feeding recommendations: Transition to Vital 1.5 at 62ml/hr ( per day) 60ml ProSource TF20 BID Provides 2320 kcal, 137g protein, 1100ml free water daily  Allie Lillyauna Jenkinson, RDN, LDN Clinical Nutrition See AMi[facility] for contact information.

## 2024-05-16 NOTE — Progress Notes (Signed)
 Inpatient Rehab Admissions Coordinator:    I spoke with Pt.'s wife regarding potential CIR admit. She is interested and can provide 24/7 assist when Pt. Returns home. I explained that therapy would focus on regaining function, but also largely on caregiver training and DME training. She is going to discuss with pt. And get back to me. Additionally, I let her know that I do not anticipate CIR would have a bed until the middle to end of next week, as we are full with few discharges. She is not sure Pt. Will want to wait but will discuss with pt. And get back to me.   Leita Kleine, MS, CCC-SLP Rehab Admissions Coordinator  [phone] (celll) [phone] (office)

## 2024-05-16 NOTE — Progress Notes (Signed)

## 2024-05-16 NOTE — Progress Notes (Signed)
 Physical Therapy Treatment Patient Details Name: Christian Bowman MRN: [id number] DOB: 1946-02-02 Today's Date: 05/16/2024   History of Present Illness Pt is a 78 y/o male presenting on 8/5 with rectal bleeding. GI felt rectal bleeding secondary to hemorrhoids.  Course complicated by ongoing hypotension. Concern for sepsis, cardiogenic shock. 8/8 chest xray with pulmonary congestion vs PNA. 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    PT Comments  Pt and wife eager to see SLP for swallowing study on entry, but when he finds out that it is his PT who is there willing to participate. Wife inquires about AIR as possible discharge disposition as opposed to going straight home with HHPT. PT discussed pros and cons of both and explored the thing that are most important for him to achieve at discharge. When pt report that he wouldn't like it but he could tolerate a cane, PT inquired whether it was more important to use the lease restrictive AD or be able to more comfortably participate in excursion to places like the club or out to dinner but maybe having to use a wheelchair. PT asked pt to spend some time reflecting on what is most important to him. Pt appreciative of conversation.  Pt continues to be limited in safe mobility by variability in BP with positional change (see General Comments). Overall pt bed mobility is contact guard and transfers are minAx2. Will ask Rehab coordinator to stop by and discuss possible option of AIR. PT will continue to follow acutely.   If plan is discharge home, recommend the following: A lot of help with walking and/or transfers;A lot of help with bathing/dressing/bathroom;Assistance with cooking/housework;Direct supervision/assist for medications management;Direct supervision/assist for financial management;Assist for transportation;Help with stairs or ramp for entrance;Supervision due to cognitive status    Can travel by private vehicle      Yes.   Equipment Recommendations  Rolling walker (2 wheels);BSC/3in1;Other (comment) (shower seat)       Precautions / Restrictions Precautions Precautions: Fall Recall of Precautions/Restrictions: Intact Restrictions Weight Bearing Restrictions Per Provider Order: No     Mobility  Bed Mobility Overal bed mobility: Needs Assistance Bed Mobility: Supine to Sit     Supine to sit: Contact guard     General bed mobility comments: increased time and cuing    Transfers Overall transfer level: Needs assistance Equipment used: Rolling walker (2 wheels) Transfers: Sit to/from Stand, Bed to chair/wheelchair/BSC Sit to Stand: +2 safety/equipment, Min assist   Step pivot transfers: +2 safety/equipment, Min assist       General transfer comment: pt able to sit to stand from EoB, pivot to [facility] and pivot to recliner, all with light min A x2,    Ambulation/Gait               General Gait Details: unable due to variation in BP, and feelings of nauseousness       Balance Overall balance assessment: Needs assistance Sitting-balance support: Feet supported, Bilateral upper extremity supported Sitting balance-Leahy Scale: Fair     Standing balance support: Bilateral upper extremity supported, During functional activity, Reliant on assistive device for balance Standing balance-Leahy Scale: Fair Standing balance comment: Steady while standing statically. Reliant on RW to steady while turning                            Communication Communication Communication: Impaired Factors Affecting Communication: Hearing impaired  Cognition Arousal: Alert Behavior During Therapy: WFL for tasks assessed/performed   PT - Cognitive impairments: No apparent impairments                         Following commands: Intact      Cueing Cueing Techniques: Verbal cues, Tactile cues     General Comments General comments (skin  integrity, edema, etc.): pt has increased work of breathing as well as change in BP with positional change. BP on EoB 100/50, unable to get standing BP before urge to have BM, with seated on BSC BP 76/54, pivoted to in front of recliner and attempted standing BP, pt with complaints of dizzines, in sitting in recliner with feet elevated BP 112/52      Pertinent Vitals/Pain Pain Assessment Pain Assessment: Faces Faces Pain Scale: Hurts little more Pain Location: bladder pressure wtih standing Pain Descriptors / Indicators: Pressure, Discomfort Pain Intervention(s): Limited activity within patient's tolerance, Monitored during session     PT Goals (current goals can now be found in the care plan section) Acute Rehab PT Goals Patient Stated Goal: Return home PT Goal Formulation: With patient/family Time For Goal Achievement: 05/28/24 Potential to Achieve Goals: Good Progress towards PT goals: Progressing toward goals    Frequency    Min 3X/week           Co-evaluation PT/OT/SLP Co-Evaluation/Treatment: Yes Reason for Co-Treatment: To address functional/ADL transfers;Complexity of the patient's impairments (multi-system involvement);For patient/therapist safety PT goals addressed during session: Mobility/safety with mobility;Strengthening/ROM;Proper use of DME        AM-PAC PT 6 Clicks Mobility   Outcome Measure  Help needed turning from your back to your side while in a flat bed without using bedrails?: A Little Help needed moving from lying on your back to sitting on the side of a flat bed without using bedrails?: A Little Help needed moving to and from a bed to a chair (including a wheelchair)?: A Little Help needed standing up from a chair using your arms (e.g., wheelchair or bedside chair)?: A Little Help needed to walk in hospital room?: A Lot Help needed climbing 3-5 steps with a railing? : Total 6 Click Score: 15    End of Session   Activity Tolerance: Patient  limited by fatigue;Other (comment) (drop in BP) Patient left: in chair;with call bell/phone within reach;with chair alarm set;with family/visitor present Nurse Communication: Mobility status PT Visit Diagnosis: Other abnormalities of gait and mobility (R26.89);Muscle weakness (generalized) (M62.81)     Time: 9040-8951 PT Time Calculation (min) (ACUTE ONLY): 49 min  Charges:    $Therapeutic Activity: 8-22 mins $Self Care/Home Management: 8-22 PT General Charges $$ ACUTE PT VISIT: 1 Visit                     Lateshia Schmoker B. Fleeta Lapidus PT, DPT Acute Rehabilitation Services Please use secure chat or  Call Office [phone]    Almarie KATHEE Fleeta [facility] 05/16/2024, 11:20 AM

## 2024-05-16 NOTE — Progress Notes (Signed)
 Occupational Therapy Treatment Patient Details Name: Christian Bowman MRN: [id number] DOB: [birth year] Today's Date: 05/16/2024   History of present illness Pt is a 78 y/o male presenting on 8/5 with rectal bleeding. GI felt rectal bleeding secondary to hemorrhoids.  Course complicated by ongoing hypotension. Concern for sepsis, cardiogenic shock. 8/8 chest xray with pulmonary congestion vs PNA. 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   OT comments  Pt agreeable to OT/PT session, he remains limited by decreased activity tolerance, generalized weakness, impaired balance, decreased safety awareness to deficits and need for increased assistance, and fluctuating BP changes with activity. He manages bed mobility with supervision, transfers with min assist +2 for safety but becomes dizzy during transfers-- see BP below.  He requires setup to min assist for dressing tasks.  Updated dc plan to >3hrs/day inpatient setting to optimize tolerance and safety for safety and quality of life prior to dc home.  Will follow acutely.   BP EOB 100/50  After transfer to [facility] 79/54 After transfer to recliner and legs elevated 112/52 (unable to sustain standing and symptomatic)       If plan is discharge home, recommend the following:  A little help with walking and/or transfers;A little help with bathing/dressing/bathroom;Assistance with cooking/housework;Assist for transportation;Help with stairs or ramp for entrance   Equipment Recommendations  BSC/3in1;Other (comment);Tub/shower bench (RW)    Recommendations for Other Services      Precautions / Restrictions Precautions Precautions: Fall Recall of Precautions/Restrictions: Intact Precaution/Restrictions Comments: protective prec, PEG, SBP >100 Restrictions Weight Bearing Restrictions Per Provider Order: No       Mobility Bed Mobility Overal bed mobility: Needs Assistance Bed  Mobility: Supine to Sit     Supine to sit: Contact guard     General bed mobility comments: increased time and cues for safety    Transfers Overall transfer level: Needs assistance Equipment used: Rolling walker (2 wheels) Transfers: Sit to/from Stand, Bed to chair/wheelchair/BSC Sit to Stand: +2 safety/equipment, Min assist     Step pivot transfers: +2 safety/equipment, Min assist     General transfer comment: pt able to sit to stand from EoB, pivot to [facility] and pivot to recliner, all with light min A x2,     Balance Overall balance assessment: Needs assistance Sitting-balance support: Feet supported, Bilateral upper extremity supported Sitting balance-Leahy Scale: Fair     Standing balance support: Bilateral upper extremity supported, During functional activity, Reliant on assistive device for balance Standing balance-Leahy Scale: Fair Standing balance comment: Steady while standing statically. Reliant on RW to steady while turning                           ADL either performed or assessed with clinical judgement   ADL Overall ADL's : Needs assistance/impaired     Grooming: Set up;Sitting           Upper Body Dressing : Set up;Sitting   Lower Body Dressing: Minimal assistance;+2 for safety/equipment;Sit to/from stand   Toilet Transfer: Minimal assistance;+2 for safety/equipment;Rolling walker (2 wheels)           Functional mobility during ADLs: Minimal assistance;+2 for safety/equipment;Rolling walker (2 wheels)      Extremity/Trunk Assessment              Vision       Perception     Praxis     Communication Communication Communication: Impaired Factors  Affecting Communication: Hearing impaired   Cognition Arousal: Alert Behavior During Therapy: WFL for tasks assessed/performed Cognition: No apparent impairments             OT - Cognition Comments: cognition appears WFL, but has some decreased awareness of safety and  deficits at this time                 Following commands: Intact        Cueing   Cueing Techniques: Verbal cues, Tactile cues  Exercises      Shoulder Instructions       General Comments pt has increased work of breathing as well as change in BP with positional change. BP on EoB 100/50, unable to get standing BP before urge to have BM, with seated on BSC BP 76/54, pivoted to in front of recliner and attempted standing BP, pt with complaints of dizzines, in sitting in recliner with feet elevated BP 112/52    Pertinent Vitals/ Pain       Pain Assessment Pain Assessment: Faces Faces Pain Scale: Hurts little more Pain Location: bladder pressure wtih standing Pain Descriptors / Indicators: Pressure, Discomfort Pain Intervention(s): Limited activity within patient's tolerance, Monitored during session, Repositioned  Home Living                                          Prior Functioning/Environment              Frequency  Min 2X/week        Progress Toward Goals  OT Goals(current goals can now be found in the care plan section)  Progress towards OT goals: Progressing toward goals  Acute Rehab OT Goals Patient Stated Goal: home OT Goal Formulation: With patient Time For Goal Achievement: 05/28/24 Potential to Achieve Goals: Good  Plan      Co-evaluation    PT/OT/SLP Co-Evaluation/Treatment: Yes Reason for Co-Treatment: To address functional/ADL transfers;Complexity of the patient's impairments (multi-system involvement);For patient/therapist safety PT goals addressed during session: Mobility/safety with mobility;Strengthening/ROM;Proper use of DME OT goals addressed during session: ADL's and self-care      AM-PAC OT 6 Clicks Daily Activity     Outcome Measure   Help from another person eating meals?: Total Help from another person taking care of personal grooming?: A Little Help from another person toileting, which includes using  toliet, bedpan, or urinal?: A Little Help from another person bathing (including washing, rinsing, drying)?: A Little Help from another person to put on and taking off regular upper body clothing?: A Little Help from another person to put on and taking off regular lower body clothing?: A Little 6 Click Score: 16    End of Session Equipment Utilized During Treatment: Rolling walker (2 wheels)  OT Visit Diagnosis: Other abnormalities of gait and mobility (R26.89);Muscle weakness (generalized) (M62.81);Other (comment) (decreased activity tolerance)   Activity Tolerance Patient tolerated treatment well   Patient Left in chair;with call bell/phone within reach;with chair alarm set;with family/visitor present   Nurse Communication Mobility status        Time: 8977-8944 OT Time Calculation (min): 33 min  Charges: OT General Charges $OT Visit: 1 Visit OT Treatments $Self Care/Home Management : 8-22 mins  Etta NOVAK, OT Acute Rehabilitation Services Office [phone] Secure Chat Preferred    Etta GORMAN [place] 05/16/2024, 11:33 AM

## 2024-05-16 NOTE — Plan of Care (Signed)
  Problem: Education: Goal: Ability to describe self-care measures that may prevent or decrease complications (Diabetes Survival Skills Education) will improve Outcome: Progressing   Problem: Coping: Goal: Ability to adjust to condition or change in health will improve Outcome: Progressing   Problem: Fluid Volume: Goal: Ability to maintain a balanced intake and output will improve Outcome: Progressing   Problem: Health Behavior/Discharge Planning: Goal: Ability to identify and utilize available resources and services will improve Outcome: Progressing Goal: Ability to manage health-related needs will improve Outcome: Progressing   Problem: Metabolic: Goal: Ability to maintain appropriate glucose levels will improve Outcome: Progressing   Problem: Nutritional: Goal: Maintenance of adequate nutrition will improve Outcome: Progressing Goal: Progress toward achieving an optimal weight will improve Outcome: Progressing   Problem: Tissue Perfusion: Goal: Adequacy of tissue perfusion will improve Outcome: Progressing   Problem: Education: Goal: Knowledge of General Education information will improve Description: Including pain rating scale, medication(s)/side effects and non-pharmacologic comfort measures Outcome: Progressing   Problem: Health Behavior/Discharge Planning: Goal: Ability to manage health-related needs will improve Outcome: Progressing   Problem: Clinical Measurements: Goal: Ability to maintain clinical measurements within normal limits will improve Outcome: Progressing Goal: Will remain free from infection Outcome: Progressing Goal: Diagnostic test results will improve Outcome: Progressing Goal: Respiratory complications will improve Outcome: Progressing Goal: Cardiovascular complication will be avoided Outcome: Progressing   Problem: Activity: Goal: Risk for activity intolerance will decrease Outcome: Progressing   Problem: Nutrition: Goal: Adequate  nutrition will be maintained Outcome: Progressing   Problem: Coping: Goal: Level of anxiety will decrease Outcome: Progressing   Problem: Elimination: Goal: Will not experience complications related to bowel motility Outcome: Progressing Goal: Will not experience complications related to urinary retention Outcome: Progressing   Problem: Pain Managment: Goal: General experience of comfort will improve and/or be controlled Outcome: Progressing   Problem: Safety: Goal: Ability to remain free from injury will improve Outcome: Progressing   Problem: Skin Integrity: Goal: Risk for impaired skin integrity will decrease Outcome: Progressing   Problem: Education: Goal: Individualized Educational Video(s) Outcome: Not Progressing   Problem: Skin Integrity: Goal: Risk for impaired skin integrity will decrease Outcome: Not Progressing

## 2024-05-17 DIAGNOSIS — K625 Hemorrhage of anus and rectum: Secondary | ICD-10-CM | POA: Diagnosis not present

## 2024-05-17 LAB — GLUCOSE, CAPILLARY
Glucose-Capillary: 115 mg/dL — ABNORMAL HIGH (ref 70–99)
Glucose-Capillary: 134 mg/dL — ABNORMAL HIGH (ref 70–99)
Glucose-Capillary: 135 mg/dL — ABNORMAL HIGH (ref 70–99)
Glucose-Capillary: 135 mg/dL — ABNORMAL HIGH (ref 70–99)
Glucose-Capillary: 228 mg/dL — ABNORMAL HIGH (ref 70–99)
Glucose-Capillary: 239 mg/dL — ABNORMAL HIGH (ref 70–99)
Glucose-Capillary: 249 mg/dL — ABNORMAL HIGH (ref 70–99)

## 2024-05-17 LAB — CBC
HCT: 33.6 % — ABNORMAL LOW (ref 39.0–52.0)
Hemoglobin: 10.7 g/dL — ABNORMAL LOW (ref 13.0–17.0)
MCH: 30.4 pg (ref 26.0–34.0)
MCHC: 31.8 g/dL (ref 30.0–36.0)
MCV: 95.5 fL (ref 80.0–100.0)
Platelets: 200 K/uL (ref 150–400)
RBC: 3.52 MIL/uL — ABNORMAL LOW (ref 4.22–5.81)
RDW: 13.9 % (ref 11.5–15.5)
WBC: 7.7 K/uL (ref 4.0–10.5)
nRBC: 0 % (ref 0.0–0.2)

## 2024-05-17 LAB — BASIC METABOLIC PANEL WITH GFR
Anion gap: 11 (ref 5–15)
BUN: 74 mg/dL — ABNORMAL HIGH (ref 8–23)
CO2: 30 mmol/L (ref 22–32)
Calcium: 8.9 mg/dL (ref 8.9–10.3)
Chloride: 99 mmol/L (ref 98–111)
Creatinine, Ser: 2.14 mg/dL — ABNORMAL HIGH (ref 0.61–1.24)
GFR, Estimated: 31 mL/min — ABNORMAL LOW (ref >60)
Glucose, Bld: 152 mg/dL — ABNORMAL HIGH (ref 70–99)
Potassium: 3.9 mmol/L (ref 3.5–5.1)
Sodium: 140 mmol/L (ref 135–145)

## 2024-05-17 NOTE — Progress Notes (Signed)
 PROGRESS NOTE  DIMITRI SHAKESPEARE FMW:[id number] DOB: 1946-09-28 DOA: 05/06/2024 PCP: Pcp, No   LOS: 9 days   Brief Narrative / Interim history: 78 year old male with history of tonsillar cancer currently on chemoradiation, this was diagnosed 2 months ago, last chemo 7/31 with carboplatin  at reduced dose due to leukopenia and thrombocytopenia, s/p G-tube placement, chronic systolic CHF with EF less than 20%, CHB status post CRT, A-fib/flutter on Eliquis , CAD with CABG and redo, bioprosthetic mitral valve, prior CVA, CKD 3A, DM2, hypothyroidism comes into the hospital rectal bleeding.  GI evaluated patient felt to be due to hemorrhoids.  Hospital course complicated by shock presumed to be multifactorial, possibly septic versus cardiogenic, cardiology also consulted.  He is eventually stabilized, placed on midodrine , and transferred to the hospitalist service on 8/16  Subjective / 24h Interval events: He really wants something to eat today.  Denies any chest pain, denies any shortness of breath  Assesement and Plan: Principal Problem:   BRBPR (bright red blood per rectum) Active Problems:   Hyperkalemia   Chronic heart failure with reduced ejection fraction (HFrEF, <= 40%) (HCC)   Coronary artery disease involving coronary bypass graft   Hypothyroidism (acquired)   Type 2 diabetes mellitus with stage 3a chronic kidney disease, without long-term current use of insulin  (HCC)   Primary cancer of tonsillar fossa (HCC)   Chronic kidney disease, stage 3a (HCC)   Thrombocytopenia (HCC)   Leukopenia due to antineoplastic chemotherapy (HCC)   History of CVA (cerebrovascular accident)   Shock (HCC)   Principal problem Shock, etiology not entirely clear, possibly septic versus cardiogenic -required pressors in the ICU, eventually weaned off and transferred to the hospitalist service on 8/16.  He received empiric course of Zosyn , however cultures have been negative so far - Cardiology consulted and  following, he appears to be end-stage CHF, not a candidate for GDMT, declines LVAD and further invasive interventions per cardiology - Continue midodrine   Active problems PAF-rate controlled, status post cardioversion on 8/14, anticoagulated with Eliquis  now  Acute on chronic systolic CHF-he has a history of 2 prior sternotomies, making LVAD more complicated, he would like to avoid another cardiac surgery.  A TEE 8/14 showed severely dilated LV, EF less than 15%.  Has been placed on Demadex  20 mg daily, continue - Appreciate cardiology follow-up  Acute kidney injury on CKD 3a -baseline creatinine around 1.5, currently around 2, overall stable, possibly CKD got worse in the setting of shock.  Continue to monitor  DM2-continue Semglee , NovoLog   Hyperlipidemia-continue Crestor   Tonsillar cancer-was on chemoradiation, PEG tube in place.  Thrush-continue fluconazole   Goals of care-Long discussion with the patient today at bedside.  He tells me that he is tired of everything that has been going on and does not wish any further treatment for his cancer.  He knows this is terminal.  All he wants is to get his affairs in order, and get strong enough to be home for the remainder of his time.  I approached patient about CODE STATUS and DNR, and he is thinking about it but wishes to discuss with his wife further.  I will continue goals of care discussions, as likely home hospice will need to be arranged for him.  I will also present him and his wife with a MOST form  Scheduled Meds:  apixaban   5 mg Per Tube BID   bethanechol   10 mg Per Tube TID   Chlorhexidine  Gluconate Cloth  6 each Topical Daily   feeding  supplement (PROSource TF20)  60 mL Per Tube BID   fiber  1 packet Per Tube BID   fluconazole   100 mg Per Tube Daily   hydrocortisone    Rectal BID   insulin  aspart  0-15 Units Subcutaneous Q4H   insulin  aspart  8 Units Subcutaneous Q4H   insulin  glargine-yfgn  30 Units Subcutaneous Daily    levothyroxine   50 mcg Per Tube QAC breakfast   melatonin  3 mg Per Tube QHS   midodrine   15 mg Per Tube TID   rosuvastatin   10 mg Per Tube Daily   sodium chloride  flush  10-40 mL Intracatheter Q12H   sodium chloride  flush  3 mL Intravenous Q12H   torsemide   20 mg Per Tube Daily   Continuous Infusions:  feeding supplement (VITAL 1.5 CAL) 1,000 mL (05/17/24 0740)   PRN Meds:.acetaminophen  **OR** acetaminophen , loperamide  HCl, ondansetron  (ZOFRAN ) IV, oxyCODONE , senna-docusate, sodium chloride  flush  Current Outpatient Medications  Medication Instructions   acetaminophen  (TYLENOL ) 325 mg, Every 6 hours PRN   apixaban  (ELIQUIS ) 5 mg, Oral, 2 times daily   Blood Glucose Monitoring Suppl (ACCU-CHEK GUIDE ME) w/Device KIT 1 Piece, Does not apply, As directed   carvedilol  (COREG ) 12.5 MG tablet TAKE 1 & 1/2 (ONE & ONE-HALF) TABLETS BY MOUTH TWICE DAILY WITH A MEAL   dapagliflozin  propanediol (FARXIGA ) 10 mg, Oral, Daily   dexamethasone  (DECADRON ) 4 MG tablet Take 2 tablets daily for 2 days, start the day after chemotherapy. Take with food.   digoxin  (LANOXIN ) 0.125 MG tablet Take 1/2 (one-half) tablet by mouth once daily   eplerenone  (INSPRA ) 50 mg, Oral, Daily   fluconazole  (DIFLUCAN ) 10 MG/ML suspension Take 20mL per tube today, then 10mL per tube daily for 20 more days. HOLD ROSUVASTATIN  WHILE ON THIS.   furosemide  (LASIX ) 20 mg, Oral, Daily PRN   glipiZIDE  (GLUCOTROL ) 5 mg, Oral, Daily before breakfast, Only one time daily at breakfast.   glucose blood (ACCU-CHEK GUIDE TEST) test strip Use to test blood glucose daily as instructed   guaiFENesin  600 mg, Per Tube, 4 times daily PRN   levothyroxine  (SYNTHROID ) 50 mcg, Oral, Daily before breakfast   lidocaine  (XYLOCAINE ) 2 % solution Patient: Mix 1part 2% viscous lidocaine , 1part H20. Swish & swallow 10mL of diluted mixture, before meals and at bedtime, up to QID   lidocaine -prilocaine  (EMLA ) cream Apply to affected area once    neomycin-bacitracin-polymyxin (NEOSPORIN) ointment 1 application , Daily PRN   nitroGLYCERIN  (NITROSTAT ) 0.4 MG SL tablet DISSOLVE 1 TABLET UNDER THE TONGUE EVERY 5 MINUTES AS NEEDED FOR CHEST PAIN, DO NOT EXCEED 3 DOSES IN 15 MINUTES   Nutritional Supplements (FEEDING SUPPLEMENT, OSMOLITE 1.5 CAL,) LIQD Give 2 cartons at 8am, noon, 4pm bolus syringe and 1 carton at 8pm.  Flush with 60ml of water before and after each feeding.  Give additional 240ml TID between feedings for additional hydration.   ondansetron  (ZOFRAN ) 8 mg, Oral, Every 8 hours PRN, Start on the third day after chemotherapy.   oxyCODONE  (ROXICODONE ) 5 mg, Per Tube, Every 6 hours PRN   polyethylene glycol (MIRALAX ) 17 g, Daily PRN   prochlorperazine  (COMPAZINE ) 10 mg, Oral, Every 6 hours PRN   rosuvastatin  (CRESTOR ) 10 mg, Oral, Daily   sacubitril -valsartan  (ENTRESTO ) 97-103 MG 1 tablet, Oral, 2 times daily   sodium zirconium cyclosilicate  (LOKELMA ) 10 g PACK packet DISSOLVE 1 PACKET IN WATER & DRINK ONCE DAILY   Tetrahydrozoline HCl (VISINE OP) 1 drop, Daily PRN   Vitamin D  2,000 Units,  Daily with breakfast    Diet Orders (From admission, onward)     Start     Ordered   05/17/24 0935  DIET DYS 3 Room service appropriate? Yes; Fluid consistency: Thin  Diet effective now       Question Answer Comment  Room service appropriate? Yes   Fluid consistency: Thin      05/17/24 0937            DVT prophylaxis: SCDs Start: 05/06/24 1953 apixaban  (ELIQUIS ) tablet 5 mg   Lab Results  Component Value Date   PLT 200 05/17/2024      Code Status: Full Code  Family Communication: No family at bedside  Status is: Inpatient Remains inpatient appropriate because: Severity of illness   Level of care: Progressive  Consultants:  PCCM Cardiology  Objective: Vitals:   05/17/24 0257 05/17/24 0500 05/17/24 0601 05/17/24 0742  BP: (!) 98/49  95/64 (!) 97/49  Pulse: 65  90 (!) 42  Resp: (!) 31  (!) 23 17  Temp: 98.9 F  (37.2 C)   97.7 F (36.5 C)  TempSrc: Oral   Oral  SpO2: 95%   98%  Weight:  78.8 kg    Height:        Intake/Output Summary (Last 24 hours) at 05/17/2024 9088 Last data filed at 05/17/2024 0700 Gross per 24 hour  Intake 1062 ml  Output 775 ml  Net 287 ml   Wt Readings from Last 3 Encounters:  05/17/24 78.8 kg  05/01/24 86.1 kg  04/24/24 87 kg    Examination:  Constitutional: NAD Eyes: no scleral icterus ENMT: Mucous membranes are moist.  Neck: Deformity on the right side Respiratory: clear to auscultation bilaterally Cardiovascular: Regular Abdomen: non distended, no tenderness. Bowel sounds positive.  Musculoskeletal: no clubbing / cyanosis.    Data Reviewed: I have independently reviewed following labs and imaging studies   CBC Recent Labs  Lab 05/13/24 0549 05/14/24 0353 05/15/24 0515 05/16/24 0314 05/17/24 0321  WBC 13.6* 14.3* 10.8* 8.0 7.7  HGB 9.9* 10.9* 10.5* 10.6* 10.7*  HCT 29.8* 33.3* 32.7* 32.4* 33.6*  PLT 89* 115* 137* 163 200  MCV 93.1 93.0 95.3 94.5 95.5  MCH 30.9 30.4 30.6 30.9 30.4  MCHC 33.2 32.7 32.1 32.7 31.8  RDW 13.3 13.5 13.7 14.0 13.9    Recent Labs  Lab 05/10/24 1049 05/10/24 1657 05/10/24 1657 05/10/24 1823 05/11/24 0210 05/11/24 0813 05/11/24 1743 05/12/24 0235 05/12/24 2058 05/13/24 0549 05/14/24 0353 05/15/24 0515 05/16/24 0314 05/17/24 0321  NA  --  135   < >  --  138  --  135 140 144 141 140 140 139 140  K  --  3.8   < >  --  3.7  --  3.2* 4.4 3.7 3.2* 3.5 4.0 3.8 3.9  CL  --  99   < >  --  101  --  100 106 104 101 98 104 100 99  CO2  --  24   < >  --  25  --  23 25 29 27 28 31 27 30   GLUCOSE  --  219*   < >  --  176*  --  375* 262* 123* 223* 274* 132* 199* 152*  BUN  --  50*   < >  --  52*  --  48* 58* 62* 68* 68* 69* 69* 74*  CREATININE  --  2.47*   < >  --  2.48*  --  2.01*  2.30* 2.31* 2.25* 2.41* 2.31* 2.15* 2.14*  CALCIUM   --  8.5*   < >  --  8.9  --  7.9* 8.7* 9.1 8.8* 9.1 8.8* 8.7* 8.9  AST  --  21  --    --  29  --  25 26 32  --   --   --   --   --   ALT  --  29  --   --  36  --  33 37 39  --   --   --   --   --   ALKPHOS  --  48  --   --  65  --  59 70 78  --   --   --   --   --   BILITOT  --  1.9*  --   --  2.0*  --  1.4* 1.4* 1.3*  --   --   --   --   --   ALBUMIN   --  2.0*  --   --  2.1*  --  1.8* 2.0* 2.1*  --   --   --   --   --   MG  --   --   --   --   --   --   --   --   --   --  1.8  --  2.1  --   LATICACIDVEN 1.4  --   --   --   --  1.4  --   --   --   --   --   --   --   --   TSH  --   --   --  1.324  --   --   --   --   --   --   --   --   --   --   BNP  --   --   --   --   --  4,153.6*  --   --   --   --   --   --   --   --    < > = values in this interval not displayed.    ------------------------------------------------------------------------------------------------------------------ No results for input(s): CHOL, HDL, LDLCALC, TRIG, CHOLHDL, LDLDIRECT in the last 72 hours.  Lab Results  Component Value Date   HGBA1C 6.6 (H) 02/19/2024   ------------------------------------------------------------------------------------------------------------------ No results for input(s): TSH, T4TOTAL, T3FREE, THYROIDAB in the last 72 hours.  Invalid input(s): FREET3  Cardiac Enzymes No results for input(s): CKMB, TROPONINI, MYOGLOBIN in the last 168 hours.  Invalid input(s): CK ------------------------------------------------------------------------------------------------------------------    Component Value Date/Time   BNP 4,153.6 (H) 05/11/2024 0813    CBG: Recent Labs  Lab 05/16/24 1551 05/16/24 1914 05/16/24 2313 05/17/24 0405 05/17/24 0739  GLUCAP 171* 85 214* 135* 228*    Recent Results (from the past 240 hours)  Culture, blood (Routine X 2) w Reflex to ID Panel     Status: None   Collection Time: 05/07/24  4:08 PM   Specimen: BLOOD LEFT HAND  Result Value Ref Range Status   Specimen Description BLOOD LEFT HAND  Final    Special Requests   Final    BOTTLES DRAWN AEROBIC ONLY Blood Culture adequate volume   Culture   Final    NO GROWTH 5 DAYS Performed at [facility], [address], [place], [place] [address]    Report Status 05/12/2024 FINAL  Final  Culture,  blood (Routine X 2) w Reflex to ID Panel     Status: None   Collection Time: 05/07/24  4:09 PM   Specimen: BLOOD RIGHT HAND  Result Value Ref Range Status   Specimen Description BLOOD RIGHT HAND  Final   Special Requests   Final    BOTTLES DRAWN AEROBIC ONLY Blood Culture adequate volume   Culture   Final    NO GROWTH 5 DAYS Performed at [facility], [address] [place], [place] [address]    Report Status 05/12/2024 FINAL  Final  MRSA Next Gen by PCR, Nasal     Status: None   Collection Time: 05/07/24 11:36 PM   Specimen: Urine, Catheterized; Nasal Swab  Result Value Ref Range Status   MRSA by PCR Next Gen NOT DETECTED NOT DETECTED Final    Comment: (NOTE) The GeneXpert MRSA Assay (FDA approved for NASAL specimens only), is one component of a comprehensive MRSA colonization surveillance program. It is not intended to diagnose MRSA infection nor to guide or monitor treatment for MRSA infections. Test performance is not FDA approved in patients less than 81 years old. Performed at [facility], [address], [place], [place] [address]      Radiology Studies: DG Swallowing Func-Speech Pathology Result Date: 05/16/2024 Table formatting from the original result was not included. Modified Barium Swallow Study Patient Details Name: KEHINDE BOWDISH MRN: [id number] Date of Birth: [birth year] Today's Date: 05/16/2024 HPI/PMH: HPI: JARYAN CHICOINE is a 78 yo male with PMH of tonsillar cancer on chemoradiation (last chemotherapy 7/31), CHF with EF <20%, A-fib on Eliquis , CAD s/p CABG, CKD, T2DM presenting to ED 8/5 with rectal bleeding. GI evaluated 8/6, felt to be related to hemorrhoids. Developed hypotension despite multiple fluid  boluses and a low-grade fever, w/u ongoing for septic shock. CXR 8/7 shows streaky bibasilar opacities is favored to reflect pulmonary vascular congestion with suspected interstitial edema, improving on CXR 8/9. Laryngoscopy 02/18/24 shows evidence of large exophytic mass involving the R tonsil and soft palate but without any obvious involvement of the BOT. PET scan 02/28/24 reveals a hypermetabolic mass centered in the R palantine tonsil pharyngeal region extending to the glossal tonsillar sulcus and the soft palate. Seen by OP SLP 04/03/24 without overt s/s of aspiration throughout a meal. Underwent G-tube placement 04/09/24. Clinical Impression: Pt presents with severe pharyngeal dysphagia with a safety grade of 4 (chronic and gross) and efficiency grade of  3 (residue >50%) per the DIGEST.  (Overall severity rating of 4, profound). Deficits are related to significant narrowing of the pharyngoesophageal sphincter, presumably related to edema.  Pyriform space is obliterated, leading to no holding area for POs before, during, and after the swallow.  Only a small volume of material is squeezed through PES.  POs are aspirated both during the swallow and after due to backflow from the cervical esophagus. Laryngeal closure is also mildly compromised, contributing to aspiration events.  Aspiration is sensed, leading to throat-clearing and cough.  Attempted postural adjustments to help with airway protection, none of which were effective. Spoke with pt and his wife at length after the study. They verbalized understanding of results of test. Mr. Pardon prefers to continue to eat/drink small quantities of POs despite presence of aspiration.  He is debating about continuing with treatment of the cancer. Encouraged Mr. and Mrs. Rottenberg to meet with Palliative Medicine to discuss options and quality of life issues.  They are both open to having those  discussions. SLP will follow while admitted. Spoke with Dr. Neda re: the above.  Continue purees from floor stock and allow thin liquids per his preferences.  He should take small sips and rest breaks if coughing significantly. DIGEST Swallow Severity Rating*  Safety: 4  Efficiency:3  Overall Pharyngeal Swallow Severity: 4 1: mild; 2: moderate; 3: severe; 4: profound *The Dynamic Imaging Grade of Swallowing Toxicity is standardized for the head and neck cancer population, however, demonstrates promising clinical applications across populations to standardize the clinical rating of pharyngeal swallow safety and severity. Factors that may increase risk of adverse event in presence of aspiration Noe & Lianne 2021): Factors that may increase risk of adverse event in presence of aspiration Noe & Lianne 2021): Aspiration of thick, dense, and/or acidic materials Recommendations/Plan: Swallowing Evaluation Recommendations Swallowing Evaluation Recommendations Medication Administration: Via alternative means Oral care recommendations: Oral care QID (4x/day) Treatment Plan Treatment Plan Treatment recommendations: Therapy as outlined in treatment plan below Functional status assessment: Patient has had a recent decline in their functional status and demonstrates the ability to make significant improvements in function in a reasonable and predictable amount of time. Treatment frequency: Min 2x/week Treatment duration: 1 week Interventions: Diet toleration management by SLP; Patient/family education Recommendations Recommendations for follow up therapy are one component of a multi-disciplinary discharge planning process, led by the attending physician.  Recommendations may be updated based on patient status, additional functional criteria and insurance authorization. Assessment: Orofacial Exam: Orofacial Exam Oral Cavity: Oral Hygiene: WFL Orofacial Anatomy: -- (cyst right neck) Oral Motor/Sensory Function: WFL Anatomy: No data recorded Boluses Administered: Boluses Administered Boluses  Administered: Thin liquids (Level 0); Mildly thick liquids (Level 2, nectar thick); Moderately thick liquids (Level 3, honey thick); Puree  Oral Impairment Domain: Oral Impairment Domain Lip Closure: No labial escape Tongue control during bolus hold: Cohesive bolus between tongue to palatal seal Bolus preparation/mastication: Timely and efficient chewing and mashing Bolus transport/lingual motion: Brisk tongue motion Oral residue: Complete oral clearance Initiation of pharyngeal swallow : Valleculae  Pharyngeal Impairment Domain: Pharyngeal Impairment Domain Soft palate elevation: No bolus between soft palate (SP)/pharyngeal wall (PW) Laryngeal elevation: Complete superior movement of thyroid  cartilage with complete approximation of arytenoids to epiglottic petiole Anterior hyoid excursion: Partial anterior movement Epiglottic movement: Partial inversion Laryngeal vestibule closure: Incomplete, narrow column air/contrast in laryngeal vestibule Pharyngeal stripping wave : Present - complete Pharyngoesophageal segment opening: Minimal distention/minimal duration, marked obstruction of flow Tongue base retraction: Narrow column of contrast or air between tongue base and PPW Pharyngeal residue: Collection of residue within or on pharyngeal structures Location of pharyngeal residue: Valleculae; Pyriform sinuses  Esophageal Impairment Domain: No data recorded Pill: Pill Consistency administered: -- (NT) Penetration/Aspiration Scale Score: Penetration/Aspiration Scale Score 7.  Material enters airway, passes BELOW cords and not ejected out despite cough attempt by patient: Thin liquids (Level 0); Mildly thick liquids (Level 2, nectar thick); Moderately thick liquids (Level 3, honey thick); Puree Compensatory Strategies: Compensatory Strategies Compensatory strategies: Yes Effortful swallow: Ineffective Multiple swallows: Ineffective Left head turn: Ineffective   General Information: Caregiver present: Yes  Diet Prior to  this Study: NPO; G-tube   Temperature : Normal   Respiratory Status: WFL   Supplemental O2: None (Room air)   History of Recent Intubation: No  Behavior/Cognition: Alert; Cooperative Self-Feeding Abilities: Able to self-feed Baseline vocal quality/speech: Dysphonic Volitional Cough: Able to elicit Volitional Swallow: Able to elicit Exam Limitations: No limitations Goal Planning: No data recorded No data recorded No data recorded Patient/Family  Stated Goal: wants to enjoy his life with food/drink No data recorded Pain: Pain Assessment Pain Assessment: No/denies pain Faces Pain Scale: 4 Pain Location: bladder pressure wtih standing Pain Descriptors / Indicators: Pressure; Discomfort Pain Intervention(s): Limited activity within patient's tolerance; Monitored during session; Repositioned End of Session: Start Time:SLP Start Time (ACUTE ONLY): 1235 Stop Time: SLP Stop Time (ACUTE ONLY): 1300 Time Calculation:SLP Time Calculation (min) (ACUTE ONLY): 25 min Charges: SLP Evaluations $ SLP Speech Visit: 1 Visit SLP Evaluations $MBS Swallow: 1 Procedure SLP visit diagnosis: SLP Visit Diagnosis: Dysphagia, pharyngeal phase (R13.13) Past Medical History: Past Medical History: Diagnosis Date  AICD (automatic cardioverter/defibrillator) present   medtronic-   DR. CROITORU , DR. ROLAN   Anginal pain (HCC)   cp sat 08/11/17  Anxiety   CAD (coronary artery disease)   CHF (congestive heart failure) (HCC)   Complication of anesthesia   took awhile to wake up   Coronary artery disease involving coronary bypass graft   Cyst of neck   right side  DM2 (diabetes mellitus, type 2) (HCC) 08/26/2013  Dyspnea   Heart attack (HCC)   not sure when (08/20/2017)  HTN (hypertension) 08/26/2013  Hyperlipidemia 08/26/2013  Hypothyroidism   Left main coronary artery disease   Left renal artery stenosis (HCC)   Genesis 6x12 stent 2007  Localized cancer of throat (HCC)   Obesity (BMI 30.0-34.9) 08/26/2013  Postoperative atrial fibrillation (HCC)  10/15/2005  Presence of permanent cardiac pacemaker   S/P CABG x 4 10/13/2005  LIMA to LAD, SVG to intermediate branch, sequential SVG to PDA and RPL branch, EVH via right thigh  S/P mitral valve replacement with bioprosthetic valve 07/11/2016  31 mm [facility] Mitral bovine bioprosthetic tissue valve  S/P redo CABG x 2 07/11/2016  SVG to PDA and SVG to Intermediate Branch, EVH via left thigh Past Surgical History: Past Surgical History: Procedure Laterality Date  A-FLUTTER ABLATION N/A 05/11/2017  Procedure: A-Flutter Ablation;  Surgeon: Inocencio Soyla Lunger, MD;  Location: [facility] INVASIVE CV LAB;  Service: Cardiovascular;  Laterality: N/A;  BIV ICD GENERATOR CHANGEOUT N/A 08/29/2023  Procedure: BIV ICD GENERATOR CHANGEOUT;  Surgeon: Inocencio Soyla Lunger, MD;  Location: [facility] INVASIVE CV LAB;  Service: Cardiovascular;  Laterality: N/A;  CARDIAC CATHETERIZATION N/A 06/21/2016  Procedure: Right/Left Heart Cath and Coronary/Graft Angiography;  Surgeon: Ozell Fell, MD;  Location: [facility] INVASIVE CV LAB;  Service: Cardiovascular;  Laterality: N/A;  CARDIAC VALVE REPLACEMENT    CARDIOVERSION N/A 07/19/2016  Procedure: CARDIOVERSION;  Surgeon: Redell GORMAN Shallow, MD;  Location: [facility] ENDOSCOPY;  Service: Cardiovascular;  Laterality: N/A;  CARDIOVERSION N/A 09/08/2016  Procedure: CARDIOVERSION;  Surgeon: Vina LULLA Gull, MD;  Location: [facility] ENDOSCOPY;  Service: Cardiovascular;  Laterality: N/A;  CARDIOVERSION N/A 05/15/2024  Procedure: CARDIOVERSION;  Surgeon: Cherrie Toribio SAUNDERS, MD;  Location: [facility] INVASIVE CV LAB;  Service: Cardiovascular;  Laterality: N/A;  CORONARY ANGIOPLASTY    STENT 2016  [facility]  CORONARY ANGIOPLASTY WITH STENT PLACEMENT    DES in SVG to right coronary artery system  CORONARY ARTERY BYPASS GRAFT  10/13/2005  LIMA to LAD, SVG to intermediate branch, sequential SVG to PDA and RPL  CORONARY ARTERY BYPASS GRAFT N/A 07/11/2016  Procedure: REDO CORONARY ARTERY BYPASS GRAFTING (CABG) x two using left leg greater  saphenous vein harvested endoscopically-SVG to PDA -SVG to RAMUS INTERMEDIATE;  Surgeon: Sudie VEAR Laine, MD;  Location: [facility] OR;  Service: Open Heart Surgery;  Laterality: N/A;  CORONARY ARTERY BYPASS GRAFT N/A 07/11/2016  Procedure: Re-exploration (CABG)  for post op bleeding,;  Surgeon: Sudie VEAR Laine, MD;  Location: [facility] OR;  Service: Open Heart Surgery;  Laterality: N/A;  EP IMPLANTABLE DEVICE N/A 07/25/2016  Procedure: BiV ICD Insertion CRT-D;  Surgeon: Will Gladis Norton, MD;  Location: [facility] INVASIVE CV LAB;  Service: Cardiovascular;  Laterality: N/A;  INGUINAL HERNIA REPAIR Left 08/20/2017  INGUINAL HERNIA REPAIR Left 08/20/2017  Procedure: OPEN LEFT INGUINAL HERNIA REPAIR;  Surgeon: Ethyl Lenis, MD;  Location: [facility] OR;  Service: General;  Laterality: Left;  IR GASTROSTOMY TUBE MOD SED  04/09/2024  IR IMAGING GUIDED PORT INSERTION  04/09/2024  LEFT HEART CATH AND CORS/GRAFTS ANGIOGRAPHY N/A 12/18/2016  Procedure: Left Heart Cath and Cors/Grafts Angiography;  Surgeon: Ozell Fell, MD;  Location: [facility] INVASIVE CV LAB;  Service: Cardiovascular;  Laterality: N/A;  LEFT HEART CATH AND CORS/GRAFTS ANGIOGRAPHY N/A 08/22/2017  Procedure: LEFT HEART CATH AND CORS/GRAFTS ANGIOGRAPHY;  Surgeon: Rolan Ezra RAMAN, MD;  Location: [facility] INVASIVE CV LAB;  Service: Cardiovascular;  Laterality: N/A;  MITRAL VALVE REPLACEMENT N/A 07/11/2016  Procedure: MITRAL VALVE (MV) REPLACEMENT;  Surgeon: Sudie VEAR Laine, MD;  Location: [facility] OR;  Service: Open Heart Surgery;  Laterality: N/A;  MYOCARDICAL PERFUSION  10/09/2007  NORMAL PERFUSION IN ALL REGIONS;NO EVIDENCE OF INDUCIBLE ISCHEMIA;POST STRESS EF% 66  RENAL ARTERY STENT Right 2007  RENAL DOPPLER  03/28/2010  RIGHT RA-NORMAL;LEFT PROXIMAL RA AT STENT-PATENT WITH NO EVIDENCE OF SIGN DIAMETER REDUCTION. R & L KIDNEYS: EQUAL IN SIZE,SYMMETRICAL IN SHAPE.  RIGHT HEART CATH N/A 01/08/2020  Procedure: RIGHT HEART CATH;  Surgeon: Rolan Ezra RAMAN, MD;  Location: [facility] INVASIVE CV LAB;  Service:  Cardiovascular;  Laterality: N/A;  TEE WITHOUT CARDIOVERSION N/A 06/15/2016  Procedure: TRANSESOPHAGEAL ECHOCARDIOGRAM (TEE);  Surgeon: Jerel Balding, MD;  Location: [facility] ENDOSCOPY;  Service: Cardiovascular;  Laterality: N/A;  TEE WITHOUT CARDIOVERSION N/A 07/11/2016  Procedure: TRANSESOPHAGEAL ECHOCARDIOGRAM (TEE);  Surgeon: Sudie VEAR Laine, MD;  Location: [facility] OR;  Service: Open Heart Surgery;  Laterality: N/A;  TEE WITHOUT CARDIOVERSION N/A 07/19/2016  Procedure: TRANSESOPHAGEAL ECHOCARDIOGRAM (TEE);  Surgeon: Redell RAMAN Shallow, MD;  Location: [facility] ENDOSCOPY;  Service: Cardiovascular;  Laterality: N/A;  TRANSESOPHAGEAL ECHOCARDIOGRAM  10/19/2005  NORMAL LV; MILD TO MODERATE AMOUNT OF SOFT ATHEROMATOUS PLAQUE OF THE THORACIC AORTA; THE LEFT ATRIUM IS MILDLY DILATED;LEFT ATRIAL APPENDAGE FUNCTION IS NORMAL;NO THROMBUS IDENTIFIED. SMALL PFO WITH RIGHT TO LEFT SHUNT  TRANSESOPHAGEAL ECHOCARDIOGRAM (CATH LAB) N/A 05/15/2024  Procedure: TRANSESOPHAGEAL ECHOCARDIOGRAM;  Surgeon: Cherrie Toribio SAUNDERS, MD;  Location: [facility] INVASIVE CV LAB;  Service: Cardiovascular;  Laterality: N/A; Vona Alan Bradford 05/16/2024, 2:59 PM    Nilda Fendt, MD, PhD [facility]  Between 7 am - 7 pm I am available, please contact me via Amion (for emergencies) or Securechat (non urgent messages)  Between 7 pm - 7 am I am not available, please contact night coverage MD/APP via Amion

## 2024-05-17 NOTE — Progress Notes (Addendum)
 Bladder scan  at 0800  showed , patient said he wants to try to void. Patient tried couple of times, urinated 25 cc, denies pressure or pain in bladder area. HE still wants to try to urinate. Requested food, and said he knows what he can swallow. Patient stated he no longer wants tube feeding because its making him sick. Dr. Trixie notified. HE said he will come see patient shortly. Plan of care continues.

## 2024-05-17 NOTE — Progress Notes (Signed)
 Patient tried to urinate multiple times, not able to urinate more than 50 cc. Patient has urge to urinate, but could not. Bladder scan showed 538 ml, patient is now feeling pressure in bladder area. DR. Gherghe notified. Received order for I/O cath. Plan of care continues.

## 2024-05-17 NOTE — Plan of Care (Signed)

## 2024-05-17 NOTE — Progress Notes (Signed)
 CCMD called with 11 beats of vtac, patient is in the room, awake, alert, no distress, BP 129/53, HR 78-av paced. They called with vtac run this morning too, but  he was coughing up flem looked like artifact, his vitals were stable. MD notified. Wife by bedside, plan of care continues.

## 2024-05-17 NOTE — Progress Notes (Signed)
   05/17/24 0257  Vitals  Temp 98.9 F (37.2 C)  Temp Source Oral  BP (!) 98/49  MAP (mmHg) (!) 63  BP Location Right Arm  BP Method Automatic  Patient Position (if appropriate) Lying  Pulse Rate 65  Pulse Rate Source Monitor  ECG Heart Rate 84  Resp (!) 31  Level of Consciousness  Level of Consciousness Alert  MEWS COLOR  MEWS Score Color Yellow  Oxygen Therapy  SpO2 95 %  O2 Device Room Air  PCA/Epidural/Spinal Assessment  Respiratory Pattern Regular  ECG Monitoring  Cardiac Rhythm A-V Sequential paced  Glasgow Coma Scale  Eye Opening 4  Best Verbal Response (NON-intubated) 5  Best Motor Response 6  Glasgow Coma Scale Score 15  MEWS Score  MEWS Temp 0  MEWS Systolic 1  MEWS Pulse 0  MEWS RR 2  MEWS LOC 0  MEWS Score 3   Pt admitted to [id number]. Pt oriented to unit. CHG bath given. Pt made comfortable. Call light within reach. Pt denies pain. Pt's wife at bedside.CCMD called.

## 2024-05-17 NOTE — Progress Notes (Addendum)
 Patient requested pain medication prior to I/o cath, said it was very painful last time. PRN oxy administered. I/o cath completed, output was 900 ml of clear yellow with some brown sediment at the end. Patient tolerated well. Post cath bladder scan showed 0 cc. Plan of care continues.

## 2024-05-17 NOTE — Progress Notes (Signed)
 Mobility Specialist Progress Note:    05/17/24 0846  Mobility  Activity Pivoted/transferred from bed to chair  Level of Assistance Minimal assist, patient does 75% or more  Assistive Device Front wheel walker  Distance Ambulated (ft) 3 ft  Activity Response Tolerated well  Mobility Referral Yes  Mobility visit 1 Mobility  Mobility Specialist Start Time (ACUTE ONLY) [id number]  Mobility Specialist Stop Time (ACUTE ONLY) [id number]  Mobility Specialist Time Calculation (min) (ACUTE ONLY) 6 min   Pt received in bed, agreeable to mobility session. Transferred B>C via MinA and RW. Tolerated well, asx throughout. Left pt in chair with all needs met, call bell in reach.   Twanda Stakes Mobility Specialist Please contact via Special educational needs teacher or  Rehab office at [phone]

## 2024-05-17 NOTE — Progress Notes (Signed)
 Pt had no urine output since I/o cath that was done at 1233. Bladder scan now is 501 ml. He said he feels pressure, and urge but can't get it out, pt is worried his prostrate might be enlarged, and he has been having issue with urination lately. patient mentioned when he had foley, he constantly had urge to urinate. Requested if they could see a urologist, or anything that will help. DR. Gherghe notified.Received order for foley cath placement, MD sated he will address it in am. Plan of care continues.

## 2024-05-17 NOTE — Plan of Care (Signed)
  Problem: Education: Goal: Ability to describe self-care measures that may prevent or decrease complications (Diabetes Survival Skills Education) will improve Outcome: Progressing   Problem: Coping: Goal: Ability to adjust to condition or change in health will improve Outcome: Progressing   Problem: Fluid Volume: Goal: Ability to maintain a balanced intake and output will improve Outcome: Progressing   Problem: Health Behavior/Discharge Planning: Goal: Ability to identify and utilize available resources and services will improve Outcome: Progressing   Problem: Skin Integrity: Goal: Risk for impaired skin integrity will decrease Outcome: Progressing

## 2024-05-18 DIAGNOSIS — K625 Hemorrhage of anus and rectum: Secondary | ICD-10-CM | POA: Diagnosis not present

## 2024-05-18 LAB — GLUCOSE, CAPILLARY
Glucose-Capillary: 124 mg/dL — ABNORMAL HIGH (ref 70–99)
Glucose-Capillary: 153 mg/dL — ABNORMAL HIGH (ref 70–99)
Glucose-Capillary: 212 mg/dL — ABNORMAL HIGH (ref 70–99)
Glucose-Capillary: 227 mg/dL — ABNORMAL HIGH (ref 70–99)
Glucose-Capillary: 275 mg/dL — ABNORMAL HIGH (ref 70–99)
Glucose-Capillary: 79 mg/dL (ref 70–99)

## 2024-05-18 LAB — CBC
HCT: 34.5 % — ABNORMAL LOW (ref 39.0–52.0)
Hemoglobin: 11 g/dL — ABNORMAL LOW (ref 13.0–17.0)
MCH: 30.3 pg (ref 26.0–34.0)
MCHC: 31.9 g/dL (ref 30.0–36.0)
MCV: 95 fL (ref 80.0–100.0)
Platelets: 235 K/uL (ref 150–400)
RBC: 3.63 MIL/uL — ABNORMAL LOW (ref 4.22–5.81)
RDW: 14 % (ref 11.5–15.5)
WBC: 7.9 K/uL (ref 4.0–10.5)
nRBC: 0 % (ref 0.0–0.2)

## 2024-05-18 LAB — COMPREHENSIVE METABOLIC PANEL WITH GFR
ALT: 45 U/L — ABNORMAL HIGH (ref 0–44)
AST: 23 U/L (ref 15–41)
Albumin: 2.2 g/dL — ABNORMAL LOW (ref 3.5–5.0)
Alkaline Phosphatase: 77 U/L (ref 38–126)
Anion gap: 12 (ref 5–15)
BUN: 76 mg/dL — ABNORMAL HIGH (ref 8–23)
CO2: 30 mmol/L (ref 22–32)
Calcium: 9.1 mg/dL (ref 8.9–10.3)
Chloride: 98 mmol/L (ref 98–111)
Creatinine, Ser: 2.05 mg/dL — ABNORMAL HIGH (ref 0.61–1.24)
GFR, Estimated: 33 mL/min — ABNORMAL LOW (ref >60)
Glucose, Bld: 77 mg/dL (ref 70–99)
Potassium: 3.9 mmol/L (ref 3.5–5.1)
Sodium: 140 mmol/L (ref 135–145)
Total Bilirubin: 1 mg/dL (ref 0.0–1.2)
Total Protein: 6.2 g/dL — ABNORMAL LOW (ref 6.5–8.1)

## 2024-05-18 MED ORDER — TAMSULOSIN HCL 0.4 MG PO CAPS
0.4000 mg | ORAL_CAPSULE | Freq: Every day | ORAL | Status: DC
  Filled 2024-05-18: qty 1

## 2024-05-18 MED ORDER — GERHARDT'S BUTT CREAM
TOPICAL_CREAM | Freq: Two times a day (BID) | CUTANEOUS | Status: DC
  Filled 2024-05-18: qty 60

## 2024-05-18 MED ORDER — INSULIN ASPART 100 UNIT/ML IJ SOLN
5.0000 [IU] | INTRAMUSCULAR | Status: DC
  Administered 2024-05-18 – 2024-05-19 (×6): 5 [IU] via SUBCUTANEOUS

## 2024-05-18 NOTE — Plan of Care (Signed)

## 2024-05-18 NOTE — Progress Notes (Signed)
 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  GI evaluated patient felt to be due to hemorrhoids.  Hospital course complicated by shock presumed to be multifactorial, possibly septic versus cardiogenic, cardiology also consulted.  He is eventually stabilized, placed on midodrine , and transferred to the hospitalist service on 8/16  Subjective / 24h Interval events: He is disappointed, he did not do as well with solid food.  Also reports a poor appetite  Assesement and Plan: Principal Problem:   BRBPR (bright red blood per rectum) Active Problems:   Hyperkalemia   Chronic heart failure with reduced ejection fraction (HFrEF, <= 40%) (HCC)   Coronary artery disease involving coronary bypass graft   Hypothyroidism (acquired)   Type 2 diabetes mellitus with stage 3a chronic kidney disease, without long-term current use of insulin  (HCC)   Primary cancer of tonsillar fossa (HCC)   Chronic kidney disease, stage 3a (HCC)   Thrombocytopenia (HCC)   Leukopenia due to antineoplastic chemotherapy (HCC)   History of CVA (cerebrovascular accident)   Shock (HCC)   Principal problem Shock, etiology not entirely clear, possibly septic versus cardiogenic -required pressors in the ICU, eventually weaned off and transferred to the hospitalist service on 8/16.  He received empiric course of Zosyn , however cultures have been negative so far - Cardiology consulted and following, he  appears to be end-stage CHF, not a candidate for GDMT, declines LVAD and further invasive interventions per cardiology - Continue midodrine  - Appreciate cardiology follow-up  Active problems PAF-rate controlled, status post cardioversion on 8/14, anticoagulated with Eliquis  now  Acute urinary retention -patient has been having some difficulties with urinating for quite some time before coming to the hospital, possibly due to BPH.  He is asking for urology consult, I explained to the patient and wife that he would probably need a urodynamic study to see what the problem is to begin with, and this cannot happen as an inpatient.  They will set up an appointment with a local urologist and then will and he is likely to go home with the Foley catheter.  Start tamsulosin   Acute on chronic systolic CHF-he has a history of 2 prior sternotomies, making LVAD more complicated, he would like to avoid another cardiac surgery.  A TEE 8/14 showed severely dilated LV, EF less than 15%.  Has been placed on Demadex  20 mg daily, continue - Appreciate cardiology follow-up  Acute kidney injury on CKD 3a -baseline creatinine around 1.5, currently around 2, overall stable, possibly CKD got worse in the setting of shock.  Stabilized around 2, possibly new baseline  DM2-continue Semglee , NovoLog   Hyperlipidemia-continue Crestor   Tonsillar cancer-was on chemoradiation, PEG tube in place.  Thrush-continue fluconazole   Goals of care-discussed again with the patient and his wife, he wishes to remain full code for now  Scheduled Meds:  apixaban   5 mg Per Tube BID   bethanechol   10 mg Per Tube TID   Chlorhexidine  Gluconate Cloth  6 each Topical Daily   feeding supplement (PROSource TF20)  60 mL Per Tube BID   fiber  1 packet Per Tube BID   hydrocortisone    Rectal BID   insulin  aspart  0-15 Units Subcutaneous Q4H   insulin  aspart  5 Units Subcutaneous Q4H   insulin  glargine-yfgn  30 Units Subcutaneous Daily    levothyroxine   50 mcg Per Tube QAC breakfast   melatonin  3 mg Per Tube QHS   midodrine   15 mg Per Tube TID   rosuvastatin   10 mg Per Tube Daily   sodium chloride  flush  10-40 mL Intracatheter Q12H   sodium chloride  flush  3 mL Intravenous Q12H   tamsulosin   0.4 mg Oral QPC breakfast   torsemide   20 mg Per Tube Daily   Continuous Infusions:  feeding supplement (VITAL 1.5 CAL) 1,000 mL (05/17/24 0740)   PRN Meds:.acetaminophen  **OR** acetaminophen , loperamide  HCl, ondansetron  (ZOFRAN ) IV, oxyCODONE , senna-docusate, sodium chloride  flush  Current Outpatient Medications  Medication Instructions   acetaminophen  (TYLENOL ) 325 mg, Every 6 hours PRN   apixaban  (ELIQUIS ) 5 mg, Oral, 2 times daily   Blood Glucose Monitoring Suppl (ACCU-CHEK GUIDE ME) w/Device KIT 1 Piece, Does not apply, As directed   carvedilol  (COREG ) 12.5 MG tablet TAKE 1 & 1/2 (ONE & ONE-HALF) TABLETS BY MOUTH TWICE DAILY WITH A MEAL   dapagliflozin  propanediol (FARXIGA ) 10 mg, Oral, Daily   dexamethasone  (DECADRON ) 4 MG tablet Take 2 tablets daily for 2 days, start the day after chemotherapy. Take with food.   digoxin  (LANOXIN ) 0.125 MG tablet Take 1/2 (one-half) tablet by mouth once daily   eplerenone  (INSPRA ) 50 mg, Oral, Daily   fluconazole  (DIFLUCAN ) 10 MG/ML suspension Take 20mL per tube today, then 10mL per tube daily for 20 more days. HOLD ROSUVASTATIN  WHILE ON THIS.   furosemide  (LASIX ) 20 mg, Oral, Daily PRN   glipiZIDE  (GLUCOTROL ) 5 mg, Oral, Daily before breakfast, Only one time daily at breakfast.   glucose blood (ACCU-CHEK GUIDE TEST) test strip Use to test blood glucose daily as instructed   guaiFENesin  600 mg, Per Tube, 4 times daily PRN   levothyroxine  (SYNTHROID ) 50 mcg, Oral, Daily before breakfast   lidocaine  (XYLOCAINE ) 2 % solution Patient: Mix 1part 2% viscous lidocaine , 1part H20. Swish & swallow 10mL of diluted mixture, 30min before meals and at bedtime, up to QID   lidocaine -prilocaine  (EMLA ) cream  Apply to affected area once   neomycin-bacitracin-polymyxin (NEOSPORIN) ointment 1 application , Daily PRN   nitroGLYCERIN  (NITROSTAT ) 0.4 MG SL tablet DISSOLVE 1 TABLET UNDER THE TONGUE EVERY 5 MINUTES AS NEEDED FOR CHEST PAIN, DO NOT EXCEED 3 DOSES IN 15 MINUTES   Nutritional Supplements (FEEDING SUPPLEMENT, OSMOLITE 1.5 CAL,) LIQD Give 2 cartons at 8am, noon, 4pm bolus syringe and 1 carton at 8pm.  Flush with 60ml of water before and after each feeding.  Give additional 240ml TID between feedings for additional hydration.   ondansetron  (ZOFRAN ) 8 mg, Oral, Every 8 hours PRN, Start on the third day after chemotherapy.   oxyCODONE  (ROXICODONE ) 5 mg, Per Tube, Every 6 hours PRN   polyethylene glycol (MIRALAX ) 17 g, Daily PRN   prochlorperazine  (COMPAZINE ) 10 mg, Oral, Every 6 hours PRN   rosuvastatin  (CRESTOR ) 10 mg, Oral, Daily   sacubitril -valsartan  (ENTRESTO ) 97-103 MG 1 tablet, Oral, 2 times daily   sodium zirconium cyclosilicate  (LOKELMA ) 10 g PACK packet DISSOLVE 1 PACKET IN WATER & DRINK ONCE DAILY   Tetrahydrozoline HCl (VISINE OP) 1 drop, Daily PRN   Vitamin D  2,000 Units, Daily with breakfast  Diet Orders (From admission, onward)     Start     Ordered   05/17/24 0935  DIET DYS 3 Room service appropriate? Yes; Fluid consistency: Thin  Diet effective now       Question Answer Comment  Room service appropriate? Yes   Fluid consistency: Thin      05/17/24 0937            DVT prophylaxis: SCDs Start: 05/06/24 1953 apixaban  (ELIQUIS ) tablet 5 mg   Lab Results  Component Value Date   PLT 235 05/18/2024      Code Status: Full Code  Family Communication: No family at bedside  Status is: Inpatient Remains inpatient appropriate because: Severity of illness   Level of care: Progressive  Consultants:  PCCM Cardiology  Objective: Vitals:   05/17/24 2339 05/18/24 0422 05/18/24 0500 05/18/24 0900  BP: (!) 95/57 (!) 124/44  (!) 113/98  Pulse: 68 77  100  Resp: 18  17  18   Temp: 98.5 F (36.9 C) 98.5 F (36.9 C)  98.6 F (37 C)  TempSrc: Oral Oral  Oral  SpO2: 96% 97%  94%  Weight:   77.3 kg   Height:        Intake/Output Summary (Last 24 hours) at 05/18/2024 1039 Last data filed at 05/18/2024 0600 Gross per 24 hour  Intake 1240 ml  Output 2500 ml  Net -1260 ml   Wt Readings from Last 3 Encounters:  05/18/24 77.3 kg  05/01/24 86.1 kg  04/24/24 87 kg    Examination:  Constitutional: NAD Eyes: lids and conjunctivae normal, no scleral icterus ENMT: mmm Neck: normal, supple Respiratory: clear to auscultation bilaterally, no wheezing, no crackles.  Cardiovascular: Regular rate and rhythm, no murmurs / rubs / gallops.  Abdomen: soft, no distention, no tenderness. Bowel sounds positive.     Data Reviewed: I have independently reviewed following labs and imaging studies   CBC Recent Labs  Lab 05/14/24 0353 05/15/24 0515 05/16/24 0314 05/17/24 0321 05/18/24 0318  WBC 14.3* 10.8* 8.0 7.7 7.9  HGB 10.9* 10.5* 10.6* 10.7* 11.0*  HCT 33.3* 32.7* 32.4* 33.6* 34.5*  PLT 115* 137* 163 200 235  MCV 93.0 95.3 94.5 95.5 95.0  MCH 30.4 30.6 30.9 30.4 30.3  MCHC 32.7 32.1 32.7 31.8 31.9  RDW 13.5 13.7 14.0 13.9 14.0    Recent Labs  Lab 05/11/24 1743 05/12/24 0235 05/12/24 2058 05/13/24 0549 05/14/24 0353 05/15/24 0515 05/16/24 0314 05/17/24 0321 05/18/24 0318  NA 135 140 144   < > 140 140 139 140 140  K 3.2* 4.4 3.7   < > 3.5 4.0 3.8 3.9 3.9  CL 100 106 104   < > 98 104 100 99 98  CO2 23 25 29    < > 28 31 27 30 30   GLUCOSE 375* 262* 123*   < > 274* 132* 199* 152* 77  BUN 48* 58* 62*   < > 68* 69* 69* 74* 76*  CREATININE 2.01* 2.30* 2.31*   < > 2.41* 2.31* 2.15* 2.14* 2.05*  CALCIUM  7.9* 8.7* 9.1   < > 9.1 8.8* 8.7* 8.9 9.1  AST 25 26 32  --   --   --   --   --  23  ALT 33 37 39  --   --   --   --   --  45*  ALKPHOS 59 70 78  --   --   --   --   --  77  BILITOT 1.4* 1.4* 1.3*  --   --   --   --   --  1.0  ALBUMIN  1.8* 2.0*  2.1*  --   --   --   --   --  2.2*  MG  --   --   --   --  1.8  --  2.1  --   --    < > = values in this interval not displayed.    ------------------------------------------------------------------------------------------------------------------ No results for input(s): CHOL, HDL, LDLCALC, TRIG, CHOLHDL, LDLDIRECT in the last 72 hours.  Lab Results  Component Value Date   HGBA1C 6.6 (H) 02/19/2024   ------------------------------------------------------------------------------------------------------------------ No results for input(s): TSH, T4TOTAL, T3FREE, THYROIDAB in the last 72 hours.  Invalid input(s): FREET3  Cardiac Enzymes No results for input(s): CKMB, TROPONINI, MYOGLOBIN in the last 168 hours.  Invalid input(s): CK ------------------------------------------------------------------------------------------------------------------    Component Value Date/Time   BNP 4,153.6 (H) 05/11/2024 0813    CBG: Recent Labs  Lab 05/17/24 1616 05/17/24 2002 05/17/24 2347 05/18/24 0421 05/18/24 0905  GLUCAP 115* 249* 134* 79 275*    No results found for this or any previous visit (from the past 240 hours).    Radiology Studies: No results found.    Nilda Fendt, MD, PhD [facility]  Between 7 am - 7 pm I am available, please contact me via Amion (for emergencies) or Securechat (non urgent messages)  Between 7 pm - 7 am I am not available, please contact night coverage MD/APP via Amion

## 2024-05-18 NOTE — Progress Notes (Signed)
 Patient had a episode of 14 runs of V tach, he was lying in th bed, V/S obtained, patient denied any symptoms, MD notified.

## 2024-05-18 NOTE — Plan of Care (Signed)
  Problem: Coping: Goal: Ability to adjust to condition or change in health will improve Outcome: Progressing   Problem: Fluid Volume: Goal: Ability to maintain a balanced intake and output will improve Outcome: Progressing   Problem: Nutritional: Goal: Maintenance of adequate nutrition will improve Outcome: Progressing Goal: Progress toward achieving an optimal weight will improve Outcome: Progressing   Problem: Metabolic: Goal: Ability to maintain appropriate glucose levels will improve Outcome: Progressing   Problem: Skin Integrity: Goal: Risk for impaired skin integrity will decrease Outcome: Progressing

## 2024-05-18 NOTE — PMR Pre-admission (Shared)
 PMR Admission Coordinator Pre-Admission Assessment  Patient: Christian Bowman is an 78 y.o., male MRN: [id number] DOB: [birth year] Height: 6' (182.9 cm) Weight: 77.3 kg  Insurance Information HMO: ***    PPO: ***     PCP: ***     IPA: ***     80/20: ***     OTHER: *** PRIMARY: Medicare Parts A and B      Policy#:[id number]         Subscriber:  CM Name:       Phone#:      Fax#:  Pre-Cert#:       Employer:  Benefits:  Phone #:      Name:  Eff. Date: ***   Deduct: $1676      Out of Pocket Max: none      Life Max: none CIR: 100%      SNF: 20 dull days Outpatient: 80%     Co-Pay: 20% Home Health: 100%      Co-Pay:  DME: 80%     Co-Pay: 20% Providers: pt choice  SECONDARY: [employer]      Policy#: [id number]      Phone#:   Financial Counselor:       Phone#:   The "Data Collection Information Summary" for patients in Inpatient Rehabilitation Facilities with attached "Privacy Act Statement-Health Care Records" was provided and verbally reviewed with: Patient  Emergency Contact Information Contact Information     Name Relation Home Work Mobile   [place] Spouse [phone]  [phone]      Other Contacts   None on File     Current Medical History  Patient Admitting Diagnosis: *** History of Present Illness: Pt is a 78 year old male with 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 GI evaluated patient felt to be due to hemorrhoids. Hospital course complicated by shock presumed to be multifactorial, possibly septic versus cardiogenic, cardiology also consulted. Pt has decided that  he does not want to continue XRT/chemo. He wants to focus on quality of life and hopes to discharge to CIR to get stronger and then home to enjoy his favorite things with his family.      Patient's medical record from [facility]  has been reviewed by the rehabilitation admission coordinator and physician.  Past Medical History  Past Medical History:  Diagnosis Date   AICD (automatic cardioverter/defibrillator) present    medtronic-   DR. CROITORU , DR. ROLAN    Anginal pain (HCC)    cp sat 08/11/17   Anxiety    CAD (coronary artery disease)    CHF (congestive heart failure) (HCC)    Complication of anesthesia    took awhile to wake up    Coronary artery disease involving coronary bypass graft    Cyst of neck    right side   DM2 (diabetes mellitus, type 2) (HCC) 08/26/2013   Dyspnea    Heart attack (HCC)    not sure when (08/20/2017)   HTN (hypertension) 08/26/2013   Hyperlipidemia 08/26/2013   Hypothyroidism    Left main coronary artery disease    Left renal artery stenosis (HCC)  Genesis 6x12 stent 2007   Localized cancer of throat (HCC)    Obesity (BMI 30.0-34.9) 08/26/2013   Postoperative atrial fibrillation (HCC) 10/15/2005   Presence of permanent cardiac pacemaker    S/P CABG x 4 10/13/2005   LIMA to LAD, SVG to intermediate branch, sequential SVG to PDA and RPL branch, EVH via right thigh   S/P mitral valve replacement with bioprosthetic valve 07/11/2016   31 mm [facility] Mitral bovine bioprosthetic tissue valve   S/P redo CABG x 2 07/11/2016   SVG to PDA and SVG to Intermediate Branch, EVH via left thigh    Has the patient had major surgery during 100 days prior to admission? No  Family History   family history includes Heart attack in his father and mother; Heart disease in his father, maternal grandmother, and mother; Hypertension in his father and sister.  Current Medications  Current Facility-Administered Medications:    acetaminophen   (TYLENOL ) tablet 650 mg, 650 mg, Per Tube, Q6H PRN, 650 mg at 05/11/24 2116 **OR** acetaminophen  (TYLENOL ) suppository 650 mg, 650 mg, Rectal, Q6H PRN, Ghimire, Donalda HERO, MD   apixaban  (ELIQUIS ) tablet 5 mg, 5 mg, Per Tube, BID, Bitonti, Michael T, RPH, 5 mg at 05/18/24 9092   bethanechol  (URECHOLINE ) tablet 10 mg, 10 mg, Per Tube, TID, Olalere, Adewale A, MD, 10 mg at 05/18/24 0907   Chlorhexidine  Gluconate Cloth 2 % PADS 6 each, 6 each, Topical, Daily, Ghimire, Donalda HERO, MD, 6 each at 05/18/24 0908   feeding supplement (PROSource TF20) liquid 60 mL, 60 mL, Per Tube, BID, Pawar, Rahul, MD, 60 mL at 05/18/24 0906   feeding supplement (VITAL 1.5 CAL) liquid 1,000 mL, 1,000 mL, Per Tube, Continuous, Olalere, Adewale A, MD, Last Rate: 60 mL/hr at 05/17/24 0740, 1,000 mL at 05/17/24 0740   fiber (NUTRISOURCE FIBER) 1 packet, 1 packet, Per Tube, BID, Olalere, Adewale A, MD, 1 packet at 05/18/24 0907   hydrocortisone  (ANUSOL -HC) 2.5 % rectal cream, , Rectal, BID, Pawar, Rahul, MD, Given at 05/18/24 0957   insulin  aspart (novoLOG ) injection 0-15 Units, 0-15 Units, Subcutaneous, Q4H, Olalere, Adewale A, MD, 5 Units at 05/18/24 1218   insulin  aspart (novoLOG ) injection 5 Units, 5 Units, Subcutaneous, Q4H, Gherghe, Costin M, MD, 5 Units at 05/18/24 1219   insulin  glargine-yfgn (SEMGLEE ) injection 30 Units, 30 Units, Subcutaneous, Daily, Gomes, Adriana, DO, 30 Units at 05/18/24 0907   levothyroxine  (SYNTHROID ) tablet 50 mcg, 50 mcg, Per Tube, QAC breakfast, Tobie Bloch R, MD, 50 mcg at 05/18/24 9480   loperamide  HCl (IMODIUM ) 1 MG/7.5ML suspension 2 mg, 2 mg, Per Tube, PRN, Olalere, Adewale A, MD, 2 mg at 05/16/24 2122   melatonin tablet 3 mg, 3 mg, Per Tube, QHS, Olalere, Adewale A, MD, 3 mg at 05/17/24 2131   midodrine  (PROAMATINE ) tablet 15 mg, 15 mg, Per Tube, TID, Olalere, Adewale A, MD, 15 mg at 05/18/24 0906   ondansetron  (ZOFRAN ) injection 4 mg, 4 mg, Intravenous, Q6H PRN, Patel, Vishal R, MD, 4 mg  at 05/16/24 2224   oxyCODONE  (ROXICODONE ) 5 MG/5ML solution 5 mg, 5 mg, Per Tube, Q6H PRN, Tobie Bloch R, MD, 5 mg at 05/17/24 2056   rosuvastatin  (CRESTOR ) tablet 10 mg, 10 mg, Per Tube, Daily, Ghimire, Shanker M, MD, 10 mg at 05/18/24 9092   senna-docusate (Senokot-S) tablet 1 tablet, 1 tablet, Per Tube, QHS PRN, Olalere, Adewale A, MD   sodium chloride  flush (NS) 0.9 % injection 10-40 mL, 10-40 mL, Intracatheter, Q12H, Ghimire, Donalda HERO, MD,  10 mL at 05/18/24 9090   sodium chloride  flush (NS) 0.9 % injection 10-40 mL, 10-40 mL, Intracatheter, PRN, Ghimire, Donalda HERO, MD   sodium chloride  flush (NS) 0.9 % injection 3 mL, 3 mL, Intravenous, Q12H, Tobie, Vishal R, MD, 3 mL at 05/18/24 0909   tamsulosin  (FLOMAX ) capsule 0.4 mg, 0.4 mg, Oral, QPC breakfast, Gherghe, Costin M, MD   torsemide  (DEMADEX ) tablet 20 mg, 20 mg, Per Tube, Daily, Colletta Manuelita Garre, PA-C, 20 mg at 05/18/24 9092  Patients Current Diet:  Diet Order             DIET DYS 3 Room service appropriate? Yes; Fluid consistency: Thin  Diet effective now                   Precautions / Restrictions Precautions Precautions: Fall Precaution/Restrictions Comments: protective prec, PEG, SBP >100 Restrictions Weight Bearing Restrictions Per Provider Order: Yes   Has the patient had 2 or more falls or a fall with injury in the past year? Yes  Prior Activity Level Community (5-7x/wk): Pt. active in the community PTA  Prior Functional Level Self Care: Did the patient need help bathing, dressing, using the toilet or eating? Independent  Indoor Mobility: Did the patient need assistance with walking from room to room (with or without device)? Independent  Stairs: Did the patient need assistance with internal or external stairs (with or without device)? Needed some help  Functional Cognition: Did the patient need help planning regular tasks such as shopping or remembering to take medications? Needed some help  Patient  Information Are you of Hispanic, Latino/a,or Spanish origin?: A. No, not of Hispanic, Latino/a, or Spanish origin What is your race?: A. White Do you need or want an interpreter to communicate with a doctor or health care staff?: 0. No  Patient's Response To:  Health Literacy and Transportation Is the patient able to respond to health literacy and transportation needs?: Yes Health Literacy - How often do you need to have someone help you when you read instructions, pamphlets, or other written material from your doctor or pharmacy?: Never In the past 12 months, has lack of transportation kept you from medical appointments or from getting medications?: No In the past 12 months, has lack of transportation kept you from meetings, work, or from getting things needed for daily living?: No  Home Assistive Devices / Equipment Home Equipment: Cane - quad  Prior Device Use: Indicate devices/aids used by the patient prior to current illness, exacerbation or injury? Walker  Current Functional Level Cognition  Orientation Level: Oriented X4    Extremity Assessment (includes Sensation/Coordination)  Upper Extremity Assessment: Generalized weakness  Lower Extremity Assessment: Defer to PT evaluation    ADLs  Overall ADL's : Needs assistance/impaired Grooming: Set up, Sitting Upper Body Dressing : Set up, Sitting Lower Body Dressing: Minimal assistance, +2 for safety/equipment, Sit to/from stand Toilet Transfer: Minimal assistance, +2 for safety/equipment, Rolling walker (2 wheels) Toilet Transfer Details (indicate cue type and reason): to reclienr Functional mobility during ADLs: Minimal assistance, +2 for safety/equipment, Rolling walker (2 wheels)    Mobility  Overal bed mobility: Needs Assistance Bed Mobility: Supine to Sit Supine to sit: Contact guard General bed mobility comments: increased time and cues for safety    Transfers  Overall transfer level: Needs assistance Equipment  used: Rolling walker (2 wheels) Transfers: Sit to/from Stand, Bed to chair/wheelchair/BSC Sit to Stand: +2 safety/equipment, Min assist Bed to/from chair/wheelchair/BSC transfer type:: Step pivot Step pivot  transfers: +2 safety/equipment, Min assist General transfer comment: pt able to sit to stand from EoB, pivot to [facility] and pivot to recliner, all with light min A x2,    Ambulation / Gait / Stairs / Wheelchair Mobility  Ambulation/Gait General Gait Details: unable due to variation in BP, and feelings of nauseousness    Posture / Balance Balance Overall balance assessment: Needs assistance Sitting-balance support: Feet supported, Bilateral upper extremity supported Sitting balance-Leahy Scale: Fair Standing balance support: Bilateral upper extremity supported, During functional activity, Reliant on assistive device for balance Standing balance-Leahy Scale: Fair Standing balance comment: Steady while standing statically. Reliant on RW to steady while turning    Special considerations/life events  Skin *** and Special service needs ***   Previous Home Environment (from acute therapy documentation) Living Arrangements: Spouse/significant other  Lives With: Spouse Available Help at Discharge: Family Type of Home: House Home Layout: One level Home Access: Stairs to enter Entrance Stairs-Rails: Right, None Entrance Stairs-Number of Steps: 10, 1 (1 step into house, 10 steps to basement where he likes to hang out) Bathroom Shower/Tub: Associate Professor: No Home Care Services: No  Discharge Living Setting Plans for Discharge Living Setting: Patient's home Type of Home at Discharge: House Discharge Home Layout: One level Discharge Home Access: Stairs to enter Entrance Stairs-Rails: Right Entrance Stairs-Number of Steps: 10 Discharge Bathroom Shower/Tub: Tub/shower unit Discharge Bathroom Toilet: Standard Discharge Bathroom Accessibility:  No Does the patient have any problems obtaining your medications?: No  Social/Family/Support Systems Patient Roles: Spouse Contact Information: [phone] Anticipated Caregiver: [name] Anticipated Caregiver's Contact Information: Min A Caregiver Availability: 24/7 Discharge Plan Discussed with Primary Caregiver: Yes Is Caregiver In Agreement with Plan?: Yes Does Caregiver/Family have Issues with Lodging/Transportation while Pt is in Rehab?: No  Goals Patient/Family Goal for Rehab: PT/OT/SLP Min A to Supervision, possibly w/c level Expected length of stay: 5-7 days Pt/Family Agrees to Admission and willing to participate: Yes Program Orientation Provided & Reviewed with Pt/Caregiver Including Roles  & Responsibilities: Yes  Decrease burden of Care through IP rehab admission: not anticipated  Possible need for SNF placement upon discharge: not anticipated  Patient Condition: I have reviewed medical records from [facility], spoken with CM, and patient. I met with patient at the bedside for inpatient rehabilitation assessment.  Patient will benefit from ongoing PT, OT, and SLP, can actively participate in 3 hours of therapy a day 5 days of the week, and can make measurable gains during the admission.  Patient will also benefit from the coordinated team approach during an Inpatient Acute Rehabilitation admission.  The patient will receive intensive therapy as well as Rehabilitation physician, nursing, social worker, and care management interventions.  Due to safety, skin/wound care, disease management, medication administration, pain management, and patient education the patient requires 24 hour a day rehabilitation nursing.  The patient is currently *** with mobility and basic ADLs.  Discharge setting and therapy post discharge at home with home health is anticipated.  Patient has agreed to participate in the Acute Inpatient Rehabilitation Program and will admit today.  Preadmission  Screen Completed By:  Leita KATHEE Kleine, 05/18/2024 1:01 PM ______________________________________________________________________   Discussed status with Dr. PIERRETTE on *** at *** and received approval for admission today.  Admission Coordinator:  Leita KATHEE Kleine, CCC-SLP, time PIERRETTEPattricia ***   Assessment/Plan: Diagnosis: *** Does the need for close, 24 hr/day Medical supervision in concert with the patient's rehab needs make it unreasonable for this patient  to be served in a less intensive setting? {yes_no_potentially:3041433} Co-Morbidities requiring supervision/potential complications: *** Due to {due un:6958565}, does the patient require 24 hr/day rehab nursing? {yes_no_potentially:3041433} Does the patient require coordinated care of a physician, rehab nurse, PT, OT, and SLP to address physical and functional deficits in the context of the above medical diagnosis(es)? {yes_no_potentially:3041433} Addressing deficits in the following areas: {deficits:3041436} Can the patient actively participate in an intensive therapy program of at least 3 hrs of therapy 5 days a week? {yes_no_potentially:3041433} The potential for patient to make measurable gains while on inpatient rehab is {potential:3041437} Anticipated functional outcomes upon discharge from inpatient rehab: {functional outcomes:304600100} PT, {functional outcomes:304600100} OT, {functional outcomes:304600100} SLP Estimated rehab length of stay to reach the above functional goals is: *** Anticipated discharge destination: {anticipated dc setting:21604} 10. Overall Rehab/Functional Prognosis: {potential:3041437}   MD Signature: ***

## 2024-05-19 ENCOUNTER — Ambulatory Visit

## 2024-05-19 ENCOUNTER — Other Ambulatory Visit (HOSPITAL_COMMUNITY): Payer: Self-pay

## 2024-05-19 ENCOUNTER — Encounter: Payer: Self-pay | Admitting: Oncology

## 2024-05-19 DIAGNOSIS — K625 Hemorrhage of anus and rectum: Secondary | ICD-10-CM | POA: Diagnosis not present

## 2024-05-19 LAB — CBC
HCT: 33.4 % — ABNORMAL LOW (ref 39.0–52.0)
Hemoglobin: 10.9 g/dL — ABNORMAL LOW (ref 13.0–17.0)
MCH: 31 pg (ref 26.0–34.0)
MCHC: 32.6 g/dL (ref 30.0–36.0)
MCV: 94.9 fL (ref 80.0–100.0)
Platelets: 263 K/uL (ref 150–400)
RBC: 3.52 MIL/uL — ABNORMAL LOW (ref 4.22–5.81)
RDW: 13.9 % (ref 11.5–15.5)
WBC: 8.7 K/uL (ref 4.0–10.5)
nRBC: 0 % (ref 0.0–0.2)

## 2024-05-19 LAB — COMPREHENSIVE METABOLIC PANEL WITH GFR
ALT: 36 U/L (ref 0–44)
AST: 23 U/L (ref 15–41)
Albumin: 2.2 g/dL — ABNORMAL LOW (ref 3.5–5.0)
Alkaline Phosphatase: 80 U/L (ref 38–126)
Anion gap: 10 (ref 5–15)
BUN: 77 mg/dL — ABNORMAL HIGH (ref 8–23)
CO2: 32 mmol/L (ref 22–32)
Calcium: 9.1 mg/dL (ref 8.9–10.3)
Chloride: 100 mmol/L (ref 98–111)
Creatinine, Ser: 1.97 mg/dL — ABNORMAL HIGH (ref 0.61–1.24)
GFR, Estimated: 35 mL/min — ABNORMAL LOW (ref >60)
Glucose, Bld: 163 mg/dL — ABNORMAL HIGH (ref 70–99)
Potassium: 3.8 mmol/L (ref 3.5–5.1)
Sodium: 142 mmol/L (ref 135–145)
Total Bilirubin: 1 mg/dL (ref 0.0–1.2)
Total Protein: 6.3 g/dL — ABNORMAL LOW (ref 6.5–8.1)

## 2024-05-19 LAB — MAGNESIUM: Magnesium: 2.4 mg/dL (ref 1.7–2.4)

## 2024-05-19 LAB — GLUCOSE, CAPILLARY
Glucose-Capillary: 172 mg/dL — ABNORMAL HIGH (ref 70–99)
Glucose-Capillary: 178 mg/dL — ABNORMAL HIGH (ref 70–99)
Glucose-Capillary: 245 mg/dL — ABNORMAL HIGH (ref 70–99)

## 2024-05-19 MED ORDER — BETHANECHOL CHLORIDE 10 MG PO TABS
10.0000 mg | ORAL_TABLET | Freq: Three times a day (TID) | ORAL | 0 refills | Status: AC
  Filled 2024-05-19: qty 89, 30d supply, fill #0
  Filled 2024-05-19: qty 90, 30d supply, fill #0

## 2024-05-19 MED ORDER — ORAL CARE MOUTH RINSE
15.0000 mL | OROMUCOSAL | Status: DC | PRN
Start: 2024-05-19 — End: 2024-05-19

## 2024-05-19 MED ORDER — HYDROCORTISONE (PERIANAL) 2.5 % EX CREA
TOPICAL_CREAM | Freq: Two times a day (BID) | CUTANEOUS | 0 refills | Status: AC
Start: 2024-05-19 — End: ?
  Filled 2024-05-19: qty 30, 15d supply, fill #0
  Filled 2024-05-19: qty 30, 30d supply, fill #0

## 2024-05-19 MED ORDER — MIDODRINE HCL 5 MG PO TABS
15.0000 mg | ORAL_TABLET | Freq: Three times a day (TID) | ORAL | 0 refills | Status: AC
  Filled 2024-05-19 (×2): qty 270, 30d supply, fill #0

## 2024-05-19 MED ORDER — ORAL CARE MOUTH RINSE
15.0000 mL | OROMUCOSAL | Status: DC
  Administered 2024-05-19 (×2): 15 mL via OROMUCOSAL

## 2024-05-19 MED ORDER — HEPARIN SOD (PORK) LOCK FLUSH 100 UNIT/ML IV SOLN
500.0000 [IU] | INTRAVENOUS | Status: AC | PRN
  Administered 2024-05-19: 500 [IU]

## 2024-05-19 MED ORDER — TORSEMIDE 20 MG PO TABS
20.0000 mg | ORAL_TABLET | Freq: Every day | ORAL | 0 refills | Status: DC
Start: 2024-05-19 — End: 2024-08-15
  Filled 2024-05-19 (×2): qty 90, 90d supply, fill #0

## 2024-05-19 NOTE — Progress Notes (Signed)
 Delay updated.   Per RN Patient needs prior to d/c:   DME Tube feed consult and PT.

## 2024-05-19 NOTE — TOC Transition Note (Signed)
 Transition of Care (TOC) - Discharge Note Rayfield Gobble RN, BSN Inpatient Care Management Unit 4E- RN Case Manager See Treatment Team for direct phone #   Patient Details  Name: Christian Bowman MRN: [id number] Date of Birth: 05-01-1946  Transition of Care [facility]) CM/SW Contact:  Gobble Rayfield Hurst, RN Phone Number: 05/19/2024, 2:05 PM   Clinical Narrative:    Pt stable for transition home today, Orders placed for [facility] and DME needs.   CM received msg from RD that pt will need different home TF- note placed by RD for new home TF recommendation. Pt gets his home TF from [facility]. CM contacted liaison- Pam who will follow up on new TF needs - order placed and to be signed by MD. Alfreda will need to ship to home, wife aware she will need to use TF she has at home until she gets the new formula.   CM spoke with pt and wife at bedside- confirmed they want to use [facility] needs- wife reports she has purchased a wheelchair and ramp as well as shower chair. She would like to get rollator and BSC prior to leaving - no preference in provider.  Wife will transport home.   Call made to [facility] liaison- Angie w/ [facility] who will follow up on referral and confirm acceptance.   Call made to [employer] liaison for DME needs- Rollator and BSC to be delivered to room prior to pt leaving.      Final next level of care: Home w Home Health Services Barriers to Discharge: No Barriers Identified   Patient Goals and CMS Choice Patient states their goals for this hospitalization and ongoing recovery are:: return home CMS Medicare.gov Compare Post Acute Care list provided to:: Patient Represenative (must comment) Choice offered to / list presented to : Spouse      Discharge Placement               Home w/ [facility]        Discharge Plan and Services Additional resources added to the After Visit Summary for     Discharge Planning Services: CM Consult Post Acute Care Choice: Durable Medical  Equipment, Home Health          DME Arranged: Bedside commode, Walker rolling with seat DME Agency: [facility] Date DME Agency Contacted: 05/19/24 Time DME Agency Contacted: 1145 Representative spoke with at DME Agency: Ryan HH Arranged: RN, PT, OT [facility] Agency: [facility] Date [facility] Agency Contacted: 05/19/24 Time HH Agency Contacted: 1240 Representative spoke with at [facility] Agency: [name]  Social Drivers of Health (SDOH) Interventions SDOH Screenings   Food Insecurity: No Food Insecurity (05/07/2024)  Housing: Low Risk  (05/07/2024)  Transportation Needs: No Transportation Needs (05/07/2024)  Utilities: Not At Risk (05/07/2024)  Depression (PHQ2-9): Medium Risk (05/01/2024)  Social Connections: Moderately Integrated (05/07/2024)  Tobacco Use: Low Risk  (05/06/2024)     Readmission Risk Interventions    05/19/2024    2:05 PM  Readmission Risk Prevention Plan  Transportation Screening Complete  Medication Review (RN Care Manager) Complete  PCP or Specialist appointment within 3-5 days of discharge Complete  HRI or Home Care Consult Complete  SW Recovery Care/Counseling Consult Complete  Palliative Care Screening Not Applicable  Skilled Nursing Facility Not Applicable

## 2024-05-19 NOTE — Progress Notes (Addendum)
 Occupational Therapy Treatment Patient Details Name: Christian Bowman MRN: [id number] DOB: 02-03-1946 Today's Date: 05/19/2024   History of present illness Pt is a 78 y/o male presenting on 8/5 with rectal bleeding. GI felt rectal bleeding secondary to hemorrhoids.  Course complicated by ongoing hypotension. Concern for sepsis, cardiogenic shock. 8/8 chest xray with pulmonary congestion vs PNA. 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   OT comments  Pt making progress with functional goals. Eager to d/c home this afternoon. Compression hose ordered by RN this a.m.; OT donned for pt in supine. Pt sat EOB with Sup, CGA to stand. Pt reports some dizziness in standing. Pt will be returning home with his wife who can provide assist and support as needed. Pt left in chair at end of session with PT coming in BP readings: Supine 134/59 Sitting EOB 118/71 Standing at RW 85/62 Pt returned to sitting 103/92      If plan is discharge home, recommend the following:  A little help with walking and/or transfers;A little help with bathing/dressing/bathroom;Assistance with cooking/housework;Assist for transportation;Help with stairs or ramp for entrance   Equipment Recommendations  BSC/3in1;Other (comment);Tub/shower bench;Wheelchair (measurements OT);Wheelchair cushion (measurements OT)    Recommendations for Other Services      Precautions / Restrictions Precautions Precautions: Fall Recall of Precautions/Restrictions: Intact Precaution/Restrictions Comments: PEG, watch BP, pt orthostatic Restrictions Weight Bearing Restrictions Per Provider Order: No       Mobility Bed Mobility Overal bed mobility: Needs Assistance Bed Mobility: Supine to Sit     Supine to sit: Supervision     General bed mobility comments: increased time and cues for safety    Transfers Overall transfer level: Needs assistance Equipment used:  Rolling walker (2 wheels) Transfers: Sit to/from Stand, Bed to chair/wheelchair/BSC Sit to Stand: Contact guard assist Stand pivot transfers: Contact guard assist   Step pivot transfers: Contact guard assist     General transfer comment: CGA with standing and transfers: BP supine 134/59, sitting EOB 118/71, standing 85/62. Pt returned to sitting and re checked BP reading at 103/92     Balance Overall balance assessment: Needs assistance Sitting-balance support: Feet supported, Bilateral upper extremity supported Sitting balance-Leahy Scale: Good     Standing balance support: Bilateral upper extremity supported, During functional activity, Reliant on assistive device for balance Standing balance-Leahy Scale: Fair                             ADL either performed or assessed with clinical judgement   ADL Overall ADL's : Needs assistance/impaired     Grooming: Wash/dry hands;Wash/dry face;Set up;Sitting                   Toilet Transfer: Supervision/safety;Rolling walker (2 wheels) Toilet Transfer Details (indicate cue type and reason): pt sat on bed pan at EOB Toileting- Clothing Manipulation and Hygiene: Contact guard assist;Sit to/from stand         General ADL Comments: Compression hose ordered by RN this morning and received. OT donned for pt. Educated pt and wife on compression hose donner with handout provided    Extremity/Trunk Assessment Upper Extremity Assessment Upper Extremity Assessment: Generalized weakness;Right hand dominant   Lower Extremity Assessment Lower Extremity Assessment: Defer to PT evaluation        Vision Ability to See in Adequate Light: 0 Adequate Patient Visual Report: No change from baseline  Perception     Praxis     Communication Communication Communication: Impaired Factors Affecting Communication: Hearing impaired   Cognition Arousal: Alert Behavior During Therapy: WFL for tasks  assessed/performed Cognition: No apparent impairments                               Following commands: Intact        Cueing   Cueing Techniques: Verbal cues  Exercises      Shoulder Instructions       General Comments      Pertinent Vitals/ Pain       Pain Assessment Pain Assessment: No/denies pain Faces Pain Scale: No hurt  Home Living                                          Prior Functioning/Environment              Frequency  Min 2X/week        Progress Toward Goals  OT Goals(current goals can now be found in the care plan section)  Progress towards OT goals: Progressing toward goals     Plan      Co-evaluation                 AM-PAC OT 6 Clicks Daily Activity     Outcome Measure   Help from another person eating meals?: Total Help from another person taking care of personal grooming?: A Little Help from another person toileting, which includes using toliet, bedpan, or urinal?: A Little Help from another person bathing (including washing, rinsing, drying)?: A Little Help from another person to put on and taking off regular upper body clothing?: A Little Help from another person to put on and taking off regular lower body clothing?: A Little 6 Click Score: 16    End of Session Equipment Utilized During Treatment: Rolling walker (2 wheels);Gait belt  OT Visit Diagnosis: Other abnormalities of gait and mobility (R26.89);Muscle weakness (generalized) (M62.81)   Activity Tolerance Patient limited by fatigue   Patient Left in chair;with call bell/phone within reach;with family/visitor present;Other (comment) (PT in to see pt)   Nurse Communication          Time: 8866-8799 OT Time Calculation (min): 27 min  Charges: OT General Charges $OT Visit: 1 Visit OT Treatments $Self Care/Home Management : 8-22 mins $Therapeutic Activity: 8-22 mins    Jacques Karna Loose 05/19/2024, 12:44 PM

## 2024-05-19 NOTE — Progress Notes (Signed)
 Speech Language Pathology Treatment: Dysphagia  Patient Details Name: Christian Bowman MRN: [id number] DOB: Jun 12, 1946 Today's Date: 05/19/2024 Time: 0912-1003 SLP Time Calculation (min) (ACUTE ONLY): 51 min  Assessment / Plan / Recommendation Clinical Impression  Mr. Macintyre reiterated his desire to continue to eat and drink in the presence of profound dysphagia and aspiration.  He and his wife asked very appropriate questions. We reviewed Friday's MBS and results; the reality that very little nutritional gain will be achieved through PO intake - primary reward of eating/drinking is pleasure. He may show some improvement over the next few weeks since radiation treatment has been discontinued. Improvements in swallowing will be apparent to him.  Recommend continued PO intake per his preferences - small sips/bites.  He should mobilize as much as possible and brush teeth several times/day to minimize adverse consequences of aspiration.   Mr. and Mrs. Freiberger verbalized understanding.  No further acute care SLP f/u is needed.  Our service will sign off.   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 GI evaluated 8/6, felt to be related to hemorrhoids. Developed hypotension despite multiple fluid boluses and a low-grade fever, w/u ongoing for septic shock. CXR 8/7 shows streaky bibasilar opacities is favored to reflect pulmonary vascular congestion with suspected interstitial edema, improving on CXR 8/9. Laryngoscopy 02/18/24 shows evidence of large exophytic mass involving the R tonsil and soft palate but without any obvious involvement of the BOT. PET scan 02/28/24 reveals a hypermetabolic mass centered in the R palantine tonsil pharyngeal region extending to the glossal tonsillar sulcus and the soft palate. Seen by OP SLP 04/03/24 without overt s/s  of aspiration throughout a meal. Underwent G-tube placement 04/09/24. MBS 8/15 revealed profound dysphagia. Pt chooses to continue PO intake despite risks of aspiration.      SLP Plan  All goals met          Recommendations  Diet recommendations: Dysphagia 3 (mechanical soft);Thin liquid Medication Administration: Via alternative means Supervision: Patient able to self feed Compensations: Slow rate;Small sips/bites Postural Changes and/or Swallow Maneuvers: Seated upright 90 degrees                  Oral care QID     Dysphagia, pharyngeal phase (R13.13)     All goals met    Maddoxx Burkitt L. Vona, MA CCC/SLP Clinical Specialist - Acute Care SLP Acute Rehabilitation Services Office number [phone]  [name]  05/19/2024, 10:14 AM

## 2024-05-19 NOTE — Progress Notes (Signed)
 Nutrition Follow-up  DOCUMENTATION CODES:  Severe malnutrition in context of acute illness/injury  INTERVENTION:  New tube feeding regimen via PEG tube for discharge: Start with 3 cartons (infused slowly via gravity) of Vital 1.5 daily (can provide [employer] Peptid 1.5 as equivalent) .  Provide 25-50% of a carton four times daily as tolerated.  Increase to goal of 7 cartons Vital 1.5 (or equivalent) daily as tolerated.  Tube feeding at goal provides 2485 kcal, 112g protein, free water daily  Provide 30ml free water flush before and after each bolus to maintain tube patency.   Provide an additional free water flush of 80ml 4 times daily (320ml total)   NUTRITION DIAGNOSIS:  Severe Malnutrition related to acute illness (tonsillar cancer) as evidenced by energy intake < or equal to 50% for > or equal to 5 days, severe muscle depletion. - remains applicable  GOAL:  Patient will meet greater than or equal to 90% of their needs - goal met via continuous tube feeding  MONITOR:  Labs, Weight trends, TF tolerance, I & O's  REASON FOR ASSESSMENT:  Consult Enteral/tube feeding initiation and management  ASSESSMENT:  Pt presented with nausea, vomiting, diarrhea and rectal bleeding. 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  8/14: TEE- severely dilated LV, EF less than 15% 815: s/p MBS- exhibits severe pharyngeal dysphagia, recommend continue purees and thin liquids per pt preference from floor stock  Checked in with patient at bedside. His wife also present and reports plans to discharge today. Pt seems to be tolerating the semi-elemental Vital 1.5 better than the standard formula Osmolite 1.5. His diarrhea is much improved and has not had a BM since 8/16.  Last dose of PRN imodium  given 8/15.  Pt currently denies any nausea,  vomiting or abdominal discomfort.   Pt notably did not do well with MBS. MD advanced diet this weekend as pt was hoping for some solid food which he reports consuming mashed potatoes and broccoli.   Spoke with SLP regarding discussion with family. Wife inquiring about consuming Ensure. SLP explained results of MBS and that more of the Ensure would be going into his lungs and would not be of much benefit nutritionally for him.   Pt's wife is uncertain of supplies that they received from [employer] as she mentions receiving a stand. Pt does not want to be connected to tube feeding pump all day for continuous infusion therefore would recommend continuing bolus feeds. Pt's wife is concerned about this as he previously did not tolerate >1 carton at a time and these had to be infused very slowly.   Discussed starting with 3 cartons per day with 25-50% of a carton in between and working way up to 7 cartons per day as tolerated. Suspect better tolerance with change in tube feeding formula.  Discussed decreasing flushes to 30ml before and after each feed to cut back on volume provided at a time. Encouraged to work bolus feeds around medication timing as wife reports he gets full with the volume received with medications.   Medications: nutrisource fiber BID, SSI 0-15 units q4h, novolog  5 units q4h, semglee  30 units daily, torsemide   Labs:  BUN 77 Cr 1.97 GFR 35 CBG's 124-227 x24 hours  Diet Order:   Diet Order             DIET DYS 3 Room service appropriate?  Yes; Fluid consistency: Thin  Diet effective now                   EDUCATION NEEDS:   Education needs have been addressed  Skin:  Skin Assessment: Skin Integrity Issues: Skin Integrity Issues:: Stage II Stage I: mid sacrum Stage II: sacrum  Last BM:  8/16 type 6 x3  Height:   Ht Readings from Last 1 Encounters:  05/13/24 6' (1.829 m)    Weight:   Wt Readings from Last 1 Encounters:  05/19/24 78.9 kg   BMI:  Body mass  index is 23.59 kg/m.  Estimated Nutritional Needs:   Kcal:  2300-2500  Protein:  120-135g  Fluid:  >/=2L  Christian Bowman, RDN, LDN Clinical Nutrition See AMiON for contact information.

## 2024-05-19 NOTE — Progress Notes (Signed)
 Inpatient Rehab Admissions Coordinator:    Pt. To d/c home. CIR with sign off.   Leita Kleine, MS, CCC-SLP Rehab Admissions Coordinator  [phone] (celll) [phone] (office)

## 2024-05-19 NOTE — Plan of Care (Signed)
  Problem: Education: Goal: Ability to describe self-care measures that may prevent or decrease complications (Diabetes Survival Skills Education) will improve Outcome: Progressing Goal: Individualized Educational Video(s) Outcome: Progressing   Problem: Education: Goal: Knowledge of General Education information will improve Description: Including pain rating scale, medication(s)/side effects and non-pharmacologic comfort measures Outcome: Progressing   Problem: Health Behavior/Discharge Planning: Goal: Ability to manage health-related needs will improve Outcome: Progressing   Problem: Nutrition: Goal: Adequate nutrition will be maintained Outcome: Progressing   Problem: Coping: Goal: Level of anxiety will decrease Outcome: Progressing   Problem: Elimination: Goal: Will not experience complications related to bowel motility Outcome: Progressing Goal: Will not experience complications related to urinary retention Outcome: Progressing   Problem: Pain Managment: Goal: General experience of comfort will improve and/or be controlled Outcome: Progressing   Problem: Safety: Goal: Ability to remain free from injury will improve Outcome: Progressing

## 2024-05-19 NOTE — Care Management Important Message (Signed)
 Important Message  Patient Details  Name: Christian Bowman MRN: [id number] Date of Birth: [birth year]   Important Message Given:  Yes - Medicare IM     [name] 05/19/2024, 10:33 AM

## 2024-05-19 NOTE — Progress Notes (Signed)
 Discharge instructions reviewed with pt and his wife (wife is his caregiver).  Copy of instructions given to pt/wife. Discussed new medications (handouts provided), wife already shown how to care for his foley catheter by other staff member; wife able to teach back what she has been taught (handout provided for reference as needed), pt will have HH  also coming out to the home for care needs per CM notes. Tube feeding sent home with pt to use today, CM RN has noted tube feeding has been ordered and will be delivered to the pt's home.  Staff assisting wife with pt getting dressed at this time. Pt will need to go by [facility] on the way out for new scripts ordered by MD.  Pt will be d/c'd via wheelchair with belongings and will be escorted by staff.   Keiondre Colee,RN SWOT

## 2024-05-19 NOTE — TOC Transition Note (Addendum)
 Transition of Care [facility]) - Discharge Note   Patient Details  Name: Christian Bowman MRN: [id number] Date of Birth: [birth year]  [facility] CM/SW Contact:  [name], RN Phone Number: [phone] 05/19/2024, 12:41 PM   Clinical Narrative:    Spoke to [facility] rep, [name] # [phone] and they will accept for [facility]. Contacted [employer] rep, Pam RN and they will order his tube feedings.   Contacted [employer] [place] for [employer] and 3n1 bedside commode for home. Spoke to wife about wheelchair and states the wheelchair will not fit in the home. She plans to purchase a lightweight transport wheelchair she can fold and put in the car and rolling walker.   Wife will provide transportation to home.   Faxed HH orders to [facility] # [phone].   Educated on daily weights, pt has scale at home. Wife states they have [facility] with HF booklet.   Contacted Dr Auther at [place] office for hospital follow up appt, they scheduler will review schedule and arrange appt. Faxed dc summary to PCP's office. Office will close in September. Wife will follow up with [facility] in [place], Dr Claudene # [phone].  Spoke to wife and he will go to [facility] with Dr Polo, [facility] and [facility]. Requested demographics, note and referral # [phone] fax [phone].      Final next level of care: Home w Home Health Services Barriers to Discharge: No Barriers Identified   Patient Goals and CMS Choice   CMS Medicare.gov Compare Post Acute Care list provided to:: Patient Represenative (must comment) Choice offered to / list presented to : Spouse      Discharge Placement                       Discharge Plan and Services Additional resources added to the After Visit Summary for     Discharge Planning Services: CM Consult Post Acute Care Choice: Home Health                    HH Arranged: RN, PT, OT [facility] Agency: [facility] Date [facility] Agency Contacted: 05/19/24 Time HH Agency Contacted: 1240 Representative spoke with at [facility] Agency: [name]  Social Drivers of Health (SDOH) Interventions SDOH Screenings   Food Insecurity: No Food Insecurity (05/07/2024)  Housing: Low Risk  (05/07/2024)  Transportation Needs: No Transportation Needs (05/07/2024)  Utilities: Not At Risk (05/07/2024)  Depression (PHQ2-9): Medium Risk (05/01/2024)  Social Connections: Moderately Integrated (05/07/2024)  Tobacco Use: Low Risk  (05/06/2024)     Readmission Risk Interventions     No data to display

## 2024-05-19 NOTE — Discharge Summary (Addendum)
 Physician Discharge Summary  Christian Bowman FMW:[id number] DOB: 12/03/1945 DOA: 05/06/2024  PCP: Pcp, No  Admit date: 05/06/2024 Discharge date: 05/19/2024  Admitted From: home Disposition:  home  Recommendations for Outpatient Follow-up:  Follow up with PCP in 1-2 weeks Follow-up with [facility] urology in [place] as an outpatient Follow-up with cardiology as an outpatient Recommend outpatient palliative referral  Home Health: PT, RN Equipment/Devices: None  Discharge Condition: Stable CODE STATUS: Full code Diet Orders (From admission, onward)     Start     Ordered   05/17/24 0935  DIET DYS 3 Room service appropriate? Yes; Fluid consistency: Thin  Diet effective now       Question Answer Comment  Room service appropriate? 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  GI evaluated patient felt to be due to hemorrhoids.  Hospital course complicated by shock presumed to be multifactorial, possibly septic versus cardiogenic, cardiology also consulted.  He is eventually stabilized, placed on midodrine , and transferred to the hospitalist service on 8/16  Hospital Course / Discharge diagnoses: Principal problem Shock, etiology not entirely clear, possibly septic versus cardiogenic -required pressors in the ICU, eventually weaned off and transferred to the hospitalist service on 8/16.  He received empiric course of Zosyn , however cultures have been negative so far. Cardiology consulted and  following, he appears to be end-stage CHF, not a candidate for GDMT, declines LVAD and further invasive interventions per cardiology. Continue midodrine , discussed with the CHF team on the day of discharge, his home regimen was changed as below, now on midodrine  and torsemide    Active problems PAF-rate controlled, status post cardioversion on 8/14, anticoagulated with Eliquis  now  Acute urinary retention -patient has been having some difficulties with urinating for quite some time before coming to the hospital, possibly due to BPH.  Required Foley catheter placement, he will follow-up with urology locally in [place] [facility] on chronic systolic CHF-he has a history of 2 prior sternotomies, making LVAD more complicated, he would like to avoid another cardiac surgery.  A TEE 8/14 showed severely dilated LV, EF less than 15%.  Has been placed on Demadex  20 mg daily, continue along with midodrine   Acute kidney injury on CKD 3a -baseline creatinine around 1.5, currently around 2, overall stable, possibly CKD got worse in the setting of shock.  Stabilized around 2, possibly new baseline DM2-continue home regimen  Hyperlipidemia-continue Crestor  Tonsillar cancer-was on chemoradiation, PEG tube in place.  Thrush-continue fluconazole   Goals of care-patient expressed his wishes not to continue any chemotherapy or radiation therapy, understanding what that means.  He wants to be home now and will discharge today.  He wishes to remain full code at this point.  I recommend outpatient referral and follow-up with palliative care  Sepsis ruled out   Discharge Instructions   Allergies as of 05/19/2024       Reactions   Xanax [alprazolam] Other (See Comments)   Pt feels very weak, tired and feels paralyzed  Ambien  [zolpidem ] Other (See Comments)   Delirium         Medication List     STOP taking these medications    carvedilol  12.5 MG tablet Commonly known as: COREG    digoxin  0.125 MG  tablet Commonly known as: LANOXIN    Entresto  97-103 MG Generic drug: sacubitril -valsartan    eplerenone  50 MG tablet Commonly known as: INSPRA    furosemide  20 MG tablet Commonly known as: LASIX    Lokelma  10 g Pack packet Generic drug: sodium zirconium cyclosilicate        TAKE these medications    Accu-Chek Guide Me w/Device Kit 1 Piece by Does not apply route as directed.   Accu-Chek Guide Test test strip Generic drug: glucose blood Use to test blood glucose daily as instructed   acetaminophen  325 MG tablet Commonly known as: TYLENOL  Take 325 mg by mouth every 6 (six) hours as needed for mild pain (pain score 1-3).   apixaban  5 MG Tabs tablet Commonly known as: ELIQUIS  Take 1 tablet (5 mg total) by mouth 2 (two) times daily.   bethanechol  10 MG tablet Commonly known as: URECHOLINE  Place 1 tablet (10 mg total) into feeding tube 3 (three) times daily.   dapagliflozin  propanediol 10 MG Tabs tablet Commonly known as: FARXIGA  Take 1 tablet (10 mg total) by mouth daily.   dexamethasone  4 MG tablet Commonly known as: DECADRON  Take 2 tablets daily for 2 days, start the day after chemotherapy. Take with food. What changed:  how much to take how to take this when to take this reasons to take this additional instructions   feeding supplement (OSMOLITE 1.5 CAL) Liqd Give 2 cartons at 8am, noon, 4pm bolus syringe and 1 carton at 8pm.  Flush with 60ml of water before and after each feeding.  Give additional 240ml TID between feedings for additional hydration.   fluconazole  10 MG/ML suspension Commonly known as: Diflucan  Take 20mL per tube today, then 10mL per tube daily for 20 more days. HOLD ROSUVASTATIN  WHILE ON THIS. What changed:  how much to take how to take this when to take this   glipiZIDE  5 MG tablet Commonly known as: GLUCOTROL  Take 1 tablet (5 mg total) by mouth daily before breakfast. Only one time daily at breakfast.   guaiFENesin  300 MG/15ML  Liqd Place 600 mg into feeding tube 4 (four) times daily as needed. What changed: reasons to take this   hydrocortisone  2.5 % rectal cream Commonly known as: ANUSOL -HC Place rectally 2 (two) times daily.   levothyroxine  50 MCG tablet Commonly known as: SYNTHROID  Take 1 tablet (50 mcg total) by mouth daily before breakfast.   lidocaine  2 % solution Commonly known as: XYLOCAINE  Patient: Mix 1part 2% viscous lidocaine , 1part H20. Swish & swallow 10mL of diluted mixture, before meals and at bedtime, up to QID   lidocaine -prilocaine  cream Commonly known as: EMLA  Apply to affected area once   midodrine  5 MG tablet Commonly known as: PROAMATINE  Place 3 tablets (15 mg total) into feeding tube 3 (three) times daily.   MiraLax  17 g packet Generic drug: polyethylene glycol Take 17 g by mouth daily as needed for mild constipation or moderate constipation.   neomycin-bacitracin-polymyxin [id number] ointment Commonly known as: NEOSPORIN Apply 1 application  topically daily as needed for wound care.   nitroGLYCERIN  0.4 MG SL tablet Commonly known as: NITROSTAT  DISSOLVE 1 TABLET UNDER THE TONGUE EVERY 5 MINUTES AS NEEDED FOR CHEST PAIN, DO NOT EXCEED 3 DOSES IN 15 MINUTES   ondansetron   8 MG tablet Commonly known as: Zofran  Take 1 tablet (8 mg total) by mouth every 8 (eight) hours as needed for nausea or vomiting. Start on the third day after chemotherapy.   oxyCODONE  5 MG/5ML solution Commonly known as: ROXICODONE  Place 5 mLs (5 mg total) into feeding tube every 6 (six) hours as needed for severe pain (pain score 7-10).   prochlorperazine  10 MG tablet Commonly known as: COMPAZINE  Take 1 tablet (10 mg total) by mouth every 6 (six) hours as needed for nausea or vomiting.   rosuvastatin  10 MG tablet Commonly known as: CRESTOR  Take 1 tablet (10 mg total) by mouth daily.   torsemide  20 MG tablet Commonly known as: DEMADEX  Place 1 tablet (20 mg total) into feeding tube daily.    VISINE OP Place 1 drop into both eyes daily as needed (irritation).   Vitamin D  50 MCG (2000 UT) tablet Take 2,000 Units by mouth daily with breakfast.               Durable Medical Equipment  (From admission, onward)           Start     Ordered   05/16/24 1603  For home use only DME 4 wheeled rolling walker with seat  Once       Question Answer Comment  Patient needs a walker to treat with the following condition Physical deconditioning   Patient needs a walker to treat with the following condition Heart failure (HCC)      05/16/24 1602   05/16/24 1603  For home use only DME 3 n 1  Once        05/16/24 1602           Consultations: Cardiology  PCCM  Procedures/Studies:  DG Swallowing Func-Speech Pathology Result Date: 05/16/2024 Table formatting from the original result was not included. Modified Barium Swallow Study Patient Details Name: Christian Bowman MRN: [id number] Date of Birth: 02-12-1946 Today's Date: 05/16/2024 HPI/PMH: HPI: Christian Bowman is a 78 yo male with PMH of tonsillar cancer on chemoradiation (last chemotherapy 7/31), CHF with EF <20%, A-fib on Eliquis , CAD s/p CABG, CKD, T2DM presenting to ED 8/5 with rectal bleeding. GI evaluated 8/6, felt to be related to hemorrhoids. Developed hypotension despite multiple fluid boluses and a low-grade fever, w/u ongoing for septic shock. CXR 8/7 shows streaky bibasilar opacities is favored to reflect pulmonary vascular congestion with suspected interstitial edema, improving on CXR 8/9. Laryngoscopy 02/18/24 shows evidence of large exophytic mass involving the R tonsil and soft palate but without any obvious involvement of the BOT. PET scan 02/28/24 reveals a hypermetabolic mass centered in the R palantine tonsil pharyngeal region extending to the glossal tonsillar sulcus and the soft palate. Seen by OP SLP 04/03/24 without overt s/s of aspiration throughout a meal. Underwent G-tube placement 04/09/24. Clinical Impression: Pt  presents with severe pharyngeal dysphagia with a safety grade of 4 (chronic and gross) and efficiency grade of  3 (residue >50%) per the DIGEST.  (Overall severity rating of 4, profound). Deficits are related to significant narrowing of the pharyngoesophageal sphincter, presumably related to edema.  Pyriform space is obliterated, leading to no holding area for POs before, during, and after the swallow.  Only a small volume of material is squeezed through PES.  POs are aspirated both during the swallow and after due to backflow from the cervical esophagus. Laryngeal closure is also mildly compromised, contributing to aspiration events.  Aspiration is sensed, leading to throat-clearing and  cough.  Attempted postural adjustments to help with airway protection, none of which were effective. Spoke with pt and his wife at length after the study. They verbalized understanding of results of test. Mr. Joines prefers to continue to eat/drink small quantities of POs despite presence of aspiration.  He is debating about continuing with treatment of the cancer. Encouraged Mr. and Mrs. Dorton to meet with Palliative Medicine to discuss options and quality of life issues.  They are both open to having those discussions. SLP will follow while admitted. Spoke with Dr. Neda re: the above. Continue purees from floor stock and allow thin liquids per his preferences.  He should take small sips and rest breaks if coughing significantly. DIGEST Swallow Severity Rating*  Safety: 4  Efficiency:3  Overall Pharyngeal Swallow Severity: 4 1: mild; 2: moderate; 3: severe; 4: profound *The Dynamic Imaging Grade of Swallowing Toxicity is standardized for the head and neck cancer population, however, demonstrates promising clinical applications across populations to standardize the clinical rating of pharyngeal swallow safety and severity. Factors that may increase risk of adverse event in presence of aspiration Noe & Lianne 2021): Factors  that may increase risk of adverse event in presence of aspiration Noe & Lianne 2021): Aspiration of thick, dense, and/or acidic materials Recommendations/Plan: Swallowing Evaluation Recommendations Swallowing Evaluation Recommendations Medication Administration: Via alternative means Oral care recommendations: Oral care QID (4x/day) Treatment Plan Treatment Plan Treatment recommendations: Therapy as outlined in treatment plan below Functional status assessment: Patient has had a recent decline in their functional status and demonstrates the ability to make significant improvements in function in a reasonable and predictable amount of time. Treatment frequency: Min 2x/week Treatment duration: 1 week Interventions: Diet toleration management by SLP; Patient/family education Recommendations Recommendations for follow up therapy are one component of a multi-disciplinary discharge planning process, led by the attending physician.  Recommendations may be updated based on patient status, additional functional criteria and insurance authorization. Assessment: Orofacial Exam: Orofacial Exam Oral Cavity: Oral Hygiene: WFL Orofacial Anatomy: -- (cyst right neck) Oral Motor/Sensory Function: WFL Anatomy: No data recorded Boluses Administered: Boluses Administered Boluses Administered: Thin liquids (Level 0); Mildly thick liquids (Level 2, nectar thick); Moderately thick liquids (Level 3, honey thick); Puree  Oral Impairment Domain: Oral Impairment Domain Lip Closure: No labial escape Tongue control during bolus hold: Cohesive bolus between tongue to palatal seal Bolus preparation/mastication: Timely and efficient chewing and mashing Bolus transport/lingual motion: Brisk tongue motion Oral residue: Complete oral clearance Initiation of pharyngeal swallow : Valleculae  Pharyngeal Impairment Domain: Pharyngeal Impairment Domain Soft palate elevation: No bolus between soft palate (SP)/pharyngeal wall (PW) Laryngeal elevation:  Complete superior movement of thyroid  cartilage with complete approximation of arytenoids to epiglottic petiole Anterior hyoid excursion: Partial anterior movement Epiglottic movement: Partial inversion Laryngeal vestibule closure: Incomplete, narrow column air/contrast in laryngeal vestibule Pharyngeal stripping wave : Present - complete Pharyngoesophageal segment opening: Minimal distention/minimal duration, marked obstruction of flow Tongue base retraction: Narrow column of contrast or air between tongue base and PPW Pharyngeal residue: Collection of residue within or on pharyngeal structures Location of pharyngeal residue: Valleculae; Pyriform sinuses  Esophageal Impairment Domain: No data recorded Pill: Pill Consistency administered: -- (NT) Penetration/Aspiration Scale Score: Penetration/Aspiration Scale Score 7.  Material enters airway, passes BELOW cords and not ejected out despite cough attempt by patient: Thin liquids (Level 0); Mildly thick liquids (Level 2, nectar thick); Moderately thick liquids (Level 3, honey thick); Puree Compensatory Strategies: Compensatory Strategies Compensatory strategies: Yes Effortful swallow: Ineffective Multiple  swallows: Ineffective Left head turn: Ineffective   General Information: Caregiver present: Yes  Diet Prior to this Study: NPO; G-tube   Temperature : Normal   Respiratory Status: WFL   Supplemental O2: None (Room air)   History of Recent Intubation: No  Behavior/Cognition: Alert; Cooperative Self-Feeding Abilities: Able to self-feed Baseline vocal quality/speech: Dysphonic Volitional Cough: Able to elicit Volitional Swallow: Able to elicit Exam Limitations: No limitations Goal Planning: No data recorded No data recorded No data recorded Patient/Family Stated Goal: wants to enjoy his life with food/drink No data recorded Pain: Pain Assessment Pain Assessment: No/denies pain Faces Pain Scale: 4 Pain Location: bladder pressure wtih standing Pain Descriptors /  Indicators: Pressure; Discomfort Pain Intervention(s): Limited activity within patient's tolerance; Monitored during session; Repositioned End of Session: Start Time:SLP Start Time (ACUTE ONLY): 1235 Stop Time: SLP Stop Time (ACUTE ONLY): 1300 Time Calculation:SLP Time Calculation (min) (ACUTE ONLY): 25 min Charges: SLP Evaluations $ SLP Speech Visit: 1 Visit SLP Evaluations $MBS Swallow: 1 Procedure SLP visit diagnosis: SLP Visit Diagnosis: Dysphagia, pharyngeal phase (R13.13) Past Medical History: Past Medical History: Diagnosis Date  AICD (automatic cardioverter/defibrillator) present   medtronic-   DR. CROITORU , DR. ROLAN   Anginal pain (HCC)   cp sat 08/11/17  Anxiety   CAD (coronary artery disease)   CHF (congestive heart failure) (HCC)   Complication of anesthesia   took awhile to wake up   Coronary artery disease involving coronary bypass graft   Cyst of neck   right side  DM2 (diabetes mellitus, type 2) (HCC) 08/26/2013  Dyspnea   Heart attack (HCC)   not sure when (08/20/2017)  HTN (hypertension) 08/26/2013  Hyperlipidemia 08/26/2013  Hypothyroidism   Left main coronary artery disease   Left renal artery stenosis (HCC)   Genesis 6x12 stent 2007  Localized cancer of throat (HCC)   Obesity (BMI 30.0-34.9) 08/26/2013  Postoperative atrial fibrillation (HCC) 10/15/2005  Presence of permanent cardiac pacemaker   S/P CABG x 4 10/13/2005  LIMA to LAD, SVG to intermediate branch, sequential SVG to PDA and RPL branch, EVH via right thigh  S/P mitral valve replacement with bioprosthetic valve 07/11/2016  31 mm [facility] Mitral bovine bioprosthetic tissue valve  S/P redo CABG x 2 07/11/2016  SVG to PDA and SVG to Intermediate Branch, EVH via left thigh Past Surgical History: Past Surgical History: Procedure Laterality Date  A-FLUTTER ABLATION N/A 05/11/2017  Procedure: A-Flutter Ablation;  Surgeon: Inocencio Soyla Lunger, MD;  Location: [facility] INVASIVE CV LAB;  Service: Cardiovascular;  Laterality: N/A;  BIV ICD  GENERATOR CHANGEOUT N/A 08/29/2023  Procedure: BIV ICD GENERATOR CHANGEOUT;  Surgeon: Inocencio Soyla Lunger, MD;  Location: [facility] INVASIVE CV LAB;  Service: Cardiovascular;  Laterality: N/A;  CARDIAC CATHETERIZATION N/A 06/21/2016  Procedure: Right/Left Heart Cath and Coronary/Graft Angiography;  Surgeon: Ozell Fell, MD;  Location: [facility] INVASIVE CV LAB;  Service: Cardiovascular;  Laterality: N/A;  CARDIAC VALVE REPLACEMENT    CARDIOVERSION N/A 07/19/2016  Procedure: CARDIOVERSION;  Surgeon: Redell GORMAN Shallow, MD;  Location: [facility] ENDOSCOPY;  Service: Cardiovascular;  Laterality: N/A;  CARDIOVERSION N/A 09/08/2016  Procedure: CARDIOVERSION;  Surgeon: Vina LULLA Gull, MD;  Location: [facility] ENDOSCOPY;  Service: Cardiovascular;  Laterality: N/A;  CARDIOVERSION N/A 05/15/2024  Procedure: CARDIOVERSION;  Surgeon: Cherrie Toribio SAUNDERS, MD;  Location: [facility] INVASIVE CV LAB;  Service: Cardiovascular;  Laterality: N/A;  CORONARY ANGIOPLASTY    STENT 2016  [facility]  CORONARY ANGIOPLASTY WITH STENT PLACEMENT    DES in SVG to right  coronary artery system  CORONARY ARTERY BYPASS GRAFT  10/13/2005  LIMA to LAD, SVG to intermediate branch, sequential SVG to PDA and RPL  CORONARY ARTERY BYPASS GRAFT N/A 07/11/2016  Procedure: REDO CORONARY ARTERY BYPASS GRAFTING (CABG) x two using left leg greater saphenous vein harvested endoscopically-SVG to PDA -SVG to RAMUS INTERMEDIATE;  Surgeon: Sudie VEAR Laine, MD;  Location: [facility] OR;  Service: Open Heart Surgery;  Laterality: N/A;  CORONARY ARTERY BYPASS GRAFT N/A 07/11/2016  Procedure: Re-exploration (CABG) for post op bleeding,;  Surgeon: Sudie VEAR Laine, MD;  Location: [facility] OR;  Service: Open Heart Surgery;  Laterality: N/A;  EP IMPLANTABLE DEVICE N/A 07/25/2016  Procedure: BiV ICD Insertion CRT-D;  Surgeon: Will Gladis Norton, MD;  Location: [facility] INVASIVE CV LAB;  Service: Cardiovascular;  Laterality: N/A;  INGUINAL HERNIA REPAIR Left 08/20/2017  INGUINAL HERNIA REPAIR Left 08/20/2017  Procedure: OPEN  LEFT INGUINAL HERNIA REPAIR;  Surgeon: Ethyl Lenis, MD;  Location: [facility] OR;  Service: General;  Laterality: Left;  IR GASTROSTOMY TUBE MOD SED  04/09/2024  IR IMAGING GUIDED PORT INSERTION  04/09/2024  LEFT HEART CATH AND CORS/GRAFTS ANGIOGRAPHY N/A 12/18/2016  Procedure: Left Heart Cath and Cors/Grafts Angiography;  Surgeon: Ozell Fell, MD;  Location: [facility] INVASIVE CV LAB;  Service: Cardiovascular;  Laterality: N/A;  LEFT HEART CATH AND CORS/GRAFTS ANGIOGRAPHY N/A 08/22/2017  Procedure: LEFT HEART CATH AND CORS/GRAFTS ANGIOGRAPHY;  Surgeon: Rolan Ezra RAMAN, MD;  Location: [facility] INVASIVE CV LAB;  Service: Cardiovascular;  Laterality: N/A;  MITRAL VALVE REPLACEMENT N/A 07/11/2016  Procedure: MITRAL VALVE (MV) REPLACEMENT;  Surgeon: Sudie VEAR Laine, MD;  Location: [facility] OR;  Service: Open Heart Surgery;  Laterality: N/A;  MYOCARDICAL PERFUSION  10/09/2007  NORMAL PERFUSION IN ALL REGIONS;NO EVIDENCE OF INDUCIBLE ISCHEMIA;POST STRESS EF% 66  RENAL ARTERY STENT Right 2007  RENAL DOPPLER  03/28/2010  RIGHT RA-NORMAL;LEFT PROXIMAL RA AT STENT-PATENT WITH NO EVIDENCE OF SIGN DIAMETER REDUCTION. R & L KIDNEYS: EQUAL IN SIZE,SYMMETRICAL IN SHAPE.  RIGHT HEART CATH N/A 01/08/2020  Procedure: RIGHT HEART CATH;  Surgeon: Rolan Ezra RAMAN, MD;  Location: [facility] INVASIVE CV LAB;  Service: Cardiovascular;  Laterality: N/A;  TEE WITHOUT CARDIOVERSION N/A 06/15/2016  Procedure: TRANSESOPHAGEAL ECHOCARDIOGRAM (TEE);  Surgeon: Jerel Balding, MD;  Location: [facility] ENDOSCOPY;  Service: Cardiovascular;  Laterality: N/A;  TEE WITHOUT CARDIOVERSION N/A 07/11/2016  Procedure: TRANSESOPHAGEAL ECHOCARDIOGRAM (TEE);  Surgeon: Sudie VEAR Laine, MD;  Location: [facility] OR;  Service: Open Heart Surgery;  Laterality: N/A;  TEE WITHOUT CARDIOVERSION N/A 07/19/2016  Procedure: TRANSESOPHAGEAL ECHOCARDIOGRAM (TEE);  Surgeon: Redell RAMAN Shallow, MD;  Location: [facility] ENDOSCOPY;  Service: Cardiovascular;  Laterality: N/A;  TRANSESOPHAGEAL ECHOCARDIOGRAM  10/19/2005  NORMAL LV; MILD TO  MODERATE AMOUNT OF SOFT ATHEROMATOUS PLAQUE OF THE THORACIC AORTA; THE LEFT ATRIUM IS MILDLY DILATED;LEFT ATRIAL APPENDAGE FUNCTION IS NORMAL;NO THROMBUS IDENTIFIED. SMALL PFO WITH RIGHT TO LEFT SHUNT  TRANSESOPHAGEAL ECHOCARDIOGRAM (CATH LAB) N/A 05/15/2024  Procedure: TRANSESOPHAGEAL ECHOCARDIOGRAM;  Surgeon: Cherrie Toribio SAUNDERS, MD;  Location: [facility] INVASIVE CV LAB;  Service: Cardiovascular;  Laterality: N/A; Vona Alan Bradford 05/16/2024, 2:59 PM  EP STUDY Result Date: 05/15/2024 See surgical note for result.  DG Chest Port 1 View Result Date: 05/10/2024 CLINICAL DATA:  Pulmonary edema. EXAM: PORTABLE CHEST 1 VIEW COMPARISON:  Radiograph 05/08/2024, CT 02/18/2024 FINDINGS: Accessed right chest port remains in place. Left-sided pacemaker remains in place. Stable cardiomegaly post median sternotomy and prosthetic cardiac valve. Stable mediastinal contours with prominence of the right hilum corresponding to pulmonary artery prominence on CT. Improving  pulmonary edema. No significant pleural effusion. No pneumothorax. IMPRESSION: 1. Improving pulmonary edema. 2. Stable cardiomegaly. Electronically Signed   By: Andrea Gasman M.D.   On: 05/10/2024 14:26   ECHOCARDIOGRAM COMPLETE Result Date: 05/08/2024    ECHOCARDIOGRAM REPORT   Patient Name:   DEMIAN MAISEL Date of Exam: 05/08/2024 Medical Rec #:  [id number]    Height:       72.0 in Accession #:    [id number]   Weight:       184.7 lb Date of Birth:  11-19-1945     BSA:          2.060 m Patient Age:    78 years     BP:           90/37 mmHg Patient Gender: M            HR:           78 bpm. Exam Location:  Inpatient Procedure: 2D Echo, Cardiac Doppler, Color Doppler and Intracardiac            Opacification Agent (Both Spectral and Color Flow Doppler were            utilized during procedure). Indications:    Shock  History:        Patient has prior history of Echocardiogram examinations.                  Mitral Valve: bioprosthetic valve valve is present in the  mitral                 position.  Sonographer:    Vella Key Referring Phys: [id number] DONALDA M GHIMIRE IMPRESSIONS  1. Left ventricular ejection fraction, by estimation, is <20%. The left ventricle has severely decreased function. The left ventricle demonstrates global hypokinesis. The left ventricular internal cavity size was severely dilated. There is mild concentric left ventricular hypertrophy. Left ventricular diastolic function could not be evaluated. No LV thrombus.  2. Right ventricular systolic function is normal. The right ventricular size is severely enlarged. Tricuspid regurgitation signal is inadequate for assessing PA pressure.  3. Right atrial size was moderately dilated.  4. The mitral valve has been repaired/replaced. No evidence of mitral valve regurgitation. No evidence of mitral stenosis. The mean mitral valve gradient is 6.5 mmHg. There is a bioprosthetic valve present in the mitral position.  5. Tricuspid valve regurgitation is moderate.  6. The aortic valve is tricuspid. Aortic valve regurgitation is not visualized. Aortic valve sclerosis/calcification is present, without any evidence of aortic stenosis. Aortic valve mean gradient measures 8.7 mmHg. Aortic valve Vmax measures 2.01 m/s.  7. The inferior vena cava is dilated in size with <50% respiratory variability, suggesting right atrial pressure of 15 mmHg. FINDINGS  Left Ventricle: Left ventricular ejection fraction, by estimation, is <20%. The left ventricle has severely decreased function. The left ventricle demonstrates global hypokinesis. The left ventricular internal cavity size was severely dilated. There is mild concentric left ventricular hypertrophy. Left ventricular diastolic function could not be evaluated. Right Ventricle: The right ventricular size is severely enlarged. No increase in right ventricular wall thickness. Right ventricular systolic function is normal. Tricuspid regurgitation signal is inadequate for assessing PA  pressure. Left Atrium: Left atrial size was normal in size. Right Atrium: Right atrial size was moderately dilated. Pericardium: There is no evidence of pericardial effusion. Mitral Valve: The mitral valve has been repaired/replaced. No evidence of mitral valve regurgitation. There is a bioprosthetic valve present in the  mitral position. No evidence of mitral valve stenosis. MV peak gradient, 9.4 mmHg. The mean mitral valve gradient is 6.5 mmHg. Tricuspid Valve: The tricuspid valve is normal in structure. Tricuspid valve regurgitation is moderate . No evidence of tricuspid stenosis. Aortic Valve: The aortic valve is tricuspid. Aortic valve regurgitation is not visualized. Aortic valve sclerosis/calcification is present, without any evidence of aortic stenosis. Aortic valve mean gradient measures 8.7 mmHg. Aortic valve peak gradient measures 16.1 mmHg. Pulmonic Valve: The pulmonic valve was normal in structure. Pulmonic valve regurgitation is mild. No evidence of pulmonic stenosis. Aorta: The aortic root is normal in size and structure. Venous: The inferior vena cava is dilated in size with less than 50% respiratory variability, suggesting right atrial pressure of 15 mmHg. IAS/Shunts: No atrial level shunt detected by color flow Doppler.  LEFT VENTRICLE PLAX 2D LVIDd:         6.96 cm LVIDs:         7.10 cm LV PW:         1.31 cm LV IVS:        1.26 cm LVOT diam:     1.21 cm LVOT Area:     1.15 cm  LV Volumes (MOD) LV vol d, MOD A2C: 376.0 ml LV vol d, MOD A4C: 287.0 ml LV vol s, MOD A2C: 374.0 ml LV vol s, MOD A4C: 236.0 ml LV SV MOD A2C:     2.0 ml LV SV MOD A4C:     287.0 ml LV SV MOD BP:      35.4 ml RIGHT VENTRICLE RV Basal diam:  4.90 cm LEFT ATRIUM           Index        RIGHT ATRIUM           Index LA diam:      3.88 cm 1.88 cm/m   RA Area:     22.70 cm LA Vol (A4C): 80.5 ml 39.08 ml/m  RA Volume:   74.50 ml  36.17 ml/m  AORTIC VALVE AV Vmax:      200.67 cm/s AV Vmean:     136.000 cm/s AV VTI:       0.324  m AV Peak Grad: 16.1 mmHg AV Mean Grad: 8.7 mmHg  AORTA Ao Root diam: 3.55 cm Ao Asc diam:  3.15 cm MITRAL VALVE MV Peak grad: 9.4 mmHg   SHUNTS MV Mean grad: 6.5 mmHg   Systemic Diam: 1.21 cm MV Vmax:      1.53 m/s MV Vmean:     121.0 cm/s Wilbert Bihari MD Electronically signed by Wilbert Bihari MD Signature Date/Time: 05/08/2024/1:59:17 PM    Final    DG CHEST PORT 1 VIEW Result Date: 05/08/2024 CLINICAL DATA:  Hypoxia. EXAM: PORTABLE CHEST 1 VIEW COMPARISON:  05/07/2024. FINDINGS: Stable cardiomegaly. Unchanged right-sided Port-A-Cath tip overlies the superior cavoatrial junction. Stable left-sided defibrillator. Status post CABG and mitral valve repair. Median sternotomy. Bilateral central perihilar interstitial prominence with streaky bibasilar opacities is favored to reflect pulmonary vascular congestion with suspected interstitial edema. No pleural effusion or pneumothorax. No acute osseous abnormality. IMPRESSION: Cardiomegaly with pulmonary vascular congestion and suspected interstitial edema. Electronically Signed   By: Harrietta Sherry M.D.   On: 05/08/2024 13:20   DG Chest Port 1V same Day Result Date: 05/07/2024 CLINICAL DATA:  Hypotension. EXAM: PORTABLE CHEST 1 VIEW COMPARISON:  May 01, 2024. FINDINGS: Stable cardiomediastinal silhouette. Status post coronary artery bypass graft as well as mitral valve repair. Left-sided defibrillator  is unchanged. Right sided Port-A-Cath is unchanged. Lungs are clear. Bony thorax is unremarkable. IMPRESSION: No active disease. Electronically Signed   By: Lynwood Landy Raddle M.D.   On: 05/07/2024 17:19   Abd 1 View (KUB) Result Date: 05/06/2024 CLINICAL DATA:  Nausea vomiting and diarrhea. EXAM: ABDOMEN - 1 VIEW COMPARISON:  None Available. FINDINGS: Percutaneous gastrostomy with balloon over the epigastric area. No bowel dilatation or evidence of obstruction. No free air. Osteopenia with degenerative changes of the spine. No acute osseous pathology. IMPRESSION:  Nonobstructive bowel gas pattern. Electronically Signed   By: Vanetta Chou M.D.   On: 05/06/2024 20:42   DG Chest 2 View Result Date: 05/01/2024 CLINICAL DATA:  Hemoptysis, congestion. EXAM: CHEST - 2 VIEW COMPARISON:  Feb 18, 2024. FINDINGS: Stable cardiomegaly. Status post mitral valve repair as well as coronary artery bypass graft. Left-sided defibrillator is unchanged. Right internal jugular Port-A-Cath is noted with distal tip in expected position of cavoatrial junction. No acute pulmonary disease is noted. Bony thorax is unremarkable. IMPRESSION: No active cardiopulmonary disease. Aortic Atherosclerosis (ICD10-I70.0). Electronically Signed   By: Lynwood Landy Raddle M.D.   On: 05/01/2024 17:00   CUP PACEART REMOTE DEVICE CHECK Result Date: 04/23/2024 ICD Scheduled remote reviewed. Normal device function.  Presenting rhythm: AP-AS/BiVP with bigeminy PACs. 1 VT-NS classified episode on 03/13/24 at 00:37, 15 beats, NSVT at 220 bpm. Next remote 91 days. MC, CVRS    Subjective: - no chest pain, shortness of breath, no abdominal pain, nausea or vomiting.   Discharge Exam: BP 133/61 (BP Location: Right Arm)   Pulse 74   Temp 98.5 F (36.9 C) (Oral)   Resp 20   Ht 6' (1.829 m)   Wt 78.9 kg   SpO2 94%   BMI 23.59 kg/m   General: Pt is alert, awake, not in acute distress Cardiovascular: RRR, S1/S2 +, no rubs, no gallops Respiratory: CTA bilaterally, no wheezing, no rhonchi Abdominal: Soft, NT, ND, bowel sounds + Extremities: no edema, no cyanosis    The results of significant diagnostics from this hospitalization (including imaging, microbiology, ancillary and laboratory) are listed below for reference.     Microbiology: No results found for this or any previous visit (from the past 240 hours).   Labs: Basic Metabolic Panel: Recent Labs  Lab 05/14/24 0353 05/15/24 0515 05/16/24 0314 05/17/24 0321 05/18/24 0318 05/19/24 0418  NA 140 140 139 140 140 142  K 3.5 4.0 3.8 3.9  3.9 3.8  CL 98 104 100 99 98 100  CO2 28 31 27 30 30  32  GLUCOSE 274* 132* 199* 152* 77 163*  BUN 68* 69* 69* 74* 76* 77*  CREATININE 2.41* 2.31* 2.15* 2.14* 2.05* 1.97*  CALCIUM  9.1 8.8* 8.7* 8.9 9.1 9.1  MG 1.8  --  2.1  --   --  2.4   Liver Function Tests: Recent Labs  Lab 05/12/24 2058 05/18/24 0318 05/19/24 0418  AST 32 23 23  ALT 39 45* 36  ALKPHOS 78 77 80  BILITOT 1.3* 1.0 1.0  PROT 5.6* 6.2* 6.3*  ALBUMIN  2.1* 2.2* 2.2*   CBC: Recent Labs  Lab 05/15/24 0515 05/16/24 0314 05/17/24 0321 05/18/24 0318 05/19/24 0418  WBC 10.8* 8.0 7.7 7.9 8.7  HGB 10.5* 10.6* 10.7* 11.0* 10.9*  HCT 32.7* 32.4* 33.6* 34.5* 33.4*  MCV 95.3 94.5 95.5 95.0 94.9  PLT 137* 163 200 235 263   CBG: Recent Labs  Lab 05/18/24 1604 05/18/24 1944 05/18/24 2355 05/19/24 0404 05/19/24 9256  GLUCAP 124* 227* 153* 172* 178*   Hgb A1c No results for input(s): HGBA1C in the last 72 hours. Lipid Profile No results for input(s): CHOL, HDL, LDLCALC, TRIG, CHOLHDL, LDLDIRECT in the last 72 hours. Thyroid  function studies No results for input(s): TSH, T4TOTAL, T3FREE, THYROIDAB in the last 72 hours.  Invalid input(s): FREET3 Urinalysis    Component Value Date/Time   COLORURINE AMBER (A) 05/07/2024 2336   APPEARANCEUR HAZY (A) 05/07/2024 2336   LABSPEC 1.020 05/07/2024 2336   PHURINE 5.0 05/07/2024 2336   GLUCOSEU NEGATIVE 05/07/2024 2336   HGBUR LARGE (A) 05/07/2024 2336   BILIRUBINUR NEGATIVE 05/07/2024 2336   KETONESUR 5 (A) 05/07/2024 2336   PROTEINUR 100 (A) 05/07/2024 2336   NITRITE NEGATIVE 05/07/2024 2336   LEUKOCYTESUR NEGATIVE 05/07/2024 2336    FURTHER DISCHARGE INSTRUCTIONS:   Get Medicines reviewed and adjusted: Please take all your medications with you for your next visit with your Primary MD   Laboratory/radiological data: Please request your Primary MD to go over all hospital tests and procedure/radiological results at the follow up,  please ask your Primary MD to get all Hospital records sent to his/her office.   In some cases, they will be blood work, cultures and biopsy results pending at the time of your discharge. Please request that your primary care M.D. goes through all the records of your hospital data and follows up on these results.   Also Note the following: If you experience worsening of your admission symptoms, develop shortness of breath, life threatening emergency, suicidal or homicidal thoughts you must seek medical attention immediately by calling 911 or calling your MD immediately  if symptoms less severe.   You must read complete instructions/literature along with all the possible adverse reactions/side effects for all the Medicines you take and that have been prescribed to you. Take any new Medicines after you have completely understood and accpet all the possible adverse reactions/side effects.    Do not drive when taking Pain medications or sleeping medications (Benzodaizepines)   Do not take more than prescribed Pain, Sleep and Anxiety Medications. It is not advisable to combine anxiety,sleep and pain medications without talking with your primary care practitioner   Special Instructions: If you have smoked or chewed Tobacco  in the last 2 yrs please stop smoking, stop any regular Alcohol  and or any Recreational drug use.   Wear Seat belts while driving.   Please note: You were cared for by a hospitalist during your hospital stay. Once you are discharged, your primary care physician will handle any further medical issues. Please note that NO REFILLS for any discharge medications will be authorized once you are discharged, as it is imperative that you return to your primary care physician (or establish a relationship with a primary care physician if you do not have one) for your post hospital discharge needs so that they can reassess your need for medications and monitor your lab values.  Time  coordinating discharge: 40 minutes  SIGNED:  Nilda Fendt, MD, PhD 05/19/2024, 10:39 AM

## 2024-05-19 NOTE — Progress Notes (Signed)
 Physical Therapy Treatment Patient Details Name: Christian Bowman MRN: [id number] DOB: Jun 09, 1946 Today's Date: 05/19/2024   History of Present Illness Pt is a 78 y/o male presenting on 8/5 with rectal bleeding. GI felt rectal bleeding secondary to hemorrhoids.  Course complicated by ongoing hypotension.  Underwent G-tube placement 04/09/24. Concern for sepsis, cardiogenic shock. 8/8 chest x-ray with pulmonary congestion vs PNA. MBS 8/15 revealed profound dysphagia. Pt chooses to continue PO intake despite risks of aspiration. Symptomatic orthostatic hypotension 8/18 PT/OT sessions despite BLE compression socks donned. 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    PT Comments  Pt received in chair after working with OT, spouse present, pt agreeable to therapy session after resting a few mins in recliner. BP WFL in recliner, pt with symptomatic orthostatic hypotension once standing in front of chair and improvement in BP noted after return to sitting with feet on floor. Pt tolerates standing 2-4 minutes at a time prior to increase in symptoms of orthostatic hypotension, with BLE knee-high compression socks donned throughout. HR ~120's bpm with short gait trial at bedside and brief elevation while seated to 145 bpm and monitor reading Vtach for 1-2 seconds, but pt asymptomatic and RN reports likely artifact (RN not in room at time of session, notified after session). Emphasis on pressure relief, energy conservation, activities in chair/standing he can perform to maintain strength despite drop in BP such as reciprocal transfers/seated LE ROM and marching in place. Patient will benefit from intensive inpatient follow-up therapy, >3 hours/day, however if pt/spouse instead go home, recommend max HH services, at least 3+ times per week as he remains well below functional baseline and was independent without AD and driving weekly prior to admission.   Orthostatic Sitting  BP- Sitting 117/72 (legs reclined)  Pulse- Sitting 74  Orthostatic Standing at 0 minutes  BP- Standing at 0 minutes 95/50  Pulse- Standing at 0 minutes 112 (variable 99-112)  Orthostatic Standing at 3 minutes  BP- Standing at 3 minutes  (defer, pt too nauseated/weakn with static standing)   Orthostatic Sitting  BP- Sitting 131/65 (in chair feet down, post-ambulation)  Pulse- Sitting 95     If plan is discharge home, recommend the following: A lot of help with walking and/or transfers;A lot of help with bathing/dressing/bathroom;Assistance with cooking/housework;Direct supervision/assist for medications management;Direct supervision/assist for financial management;Assist for transportation;Help with stairs or ramp for entrance;Supervision due to cognitive status   Can travel by private vehicle        Equipment Recommendations  Rolling walker (2 wheels);BSC/3in1;Other (comment) (shower seat; ramp and wc already rented per spouse)    Recommendations for Other Services       Precautions / Restrictions Precautions Precautions: Fall Recall of Precautions/Restrictions: Impaired Precaution/Restrictions Comments: PEG, watch BP- orthostatic hypotension and SBP goal >100; O2 goal 92% and greater; Neutropenic/Protective Precs Restrictions Weight Bearing Restrictions Per Provider Order: No     Mobility  Bed Mobility Overal bed mobility: Needs Assistance             General bed mobility comments: pt received up in chair with spouse present, OT leaving his room.    Transfers Overall transfer level: Needs assistance Equipment used: Rolling walker (2 wheels), Rollator (4 wheels) Transfers: Sit to/from Stand Sit to Stand: Contact guard assist           General transfer comment: chair<>rollator x2 reps, needs cues for use of brakes each time and for safe UE  placement, spouse able to remind him of safety.    Ambulation/Gait Ambulation/Gait  assistance: Min assist Gait Distance (Feet): 8 Feet Assistive device: Rollator (4 wheels) Gait Pattern/deviations: Step-through pattern, Decreased stride length, Trunk flexed, Drifts right/left       General Gait Details: Limited distance, a circle in room near chair/bed for pt safety with symptoms of nausea/fatigue increased. Needing assist for rollator management/stability, especially with backward stepping.   Stairs Stairs:  (defer, pt too dizzy; per spouse, she has a temporary ramp for stairs and plans to have assist with getting one built after DC.)           [employer] mobility:  (no WC in room, spouse reports the one they have at home is rented and too heavy/bulky to manage, PTA recommend they follow up with [facility] on alternative options for WC?)   Tilt Bed    Modified Rankin (Stroke Patients Only)       Balance           Standing balance support: During functional activity, Reliant on assistive device for balance, No upper extremity supported Standing balance-Leahy Scale: Poor Standing balance comment: fair with static standing with AD, poor unsupported                            Communication Communication Communication: Impaired Factors Affecting Communication: Hearing impaired;Reduced clarity of speech  Cognition Arousal: Alert Behavior During Therapy: WFL for tasks assessed/performed   PT - Cognitive impairments: Difficult to assess, Safety/Judgement, Problem solving Difficult to assess due to: Impaired communication                     PT - Cognition Comments: Dysarthria r/t tonsillar ca, but pt receptive to cues, spouse present for education as well. Pt wtih decreased safety as he fatigues with orthostatic BP. Pt very motivated to progress and appears frustrated with delays to discharge but cooperative/following 1-step commands well. Pt spouse [name] present and receptive to instruction on  guarding/safety and good awareness of his needs, pt listens to spouse well during session. Following commands: Intact      Cueing Cueing Techniques: Verbal cues, Gestural cues, Tactile cues  Exercises General Exercises - Lower Extremity Long Arc Quad: AROM, Both, Seated (a few reps for discussion on seated HEP)    General Comments General comments (skin integrity, edema, etc.): foam dressing intact on sacrum without visible drainage, discussed with spouse to look into roho cushion for home wheelchair/recliner as pt may sleep in recliner at home. Per spouse, pt has a ramp and wheelchair at home, PTA discussed with them energy conservation/activity pacing and recommend he pivot OOB to wheelchair/chair and mobilize initially in [facility] until tolerance for standing activity improves. Continue to recommend RW and discussion with pt/spouse on cushion options (roho cushion) for wheelchair/recliner as per spouse he may want to sleep in recliner initially and discussed pressure relief frequency/timing in chair.       Pertinent Vitals/Pain Pain Assessment Pain Assessment: No/denies pain Faces Pain Scale: Hurts a little bit Pain Location: bladder pressure wtih standing, pt reports bottom more comfortable with pillow on seat of chair but does not overtly c/o bottom pain when PTA arrived Pain Descriptors / Indicators: Pressure, Discomfort Pain Intervention(s): Monitored during session, Repositioned    Home Living  Prior Function            PT Goals (current goals can now be found in the care plan section) Acute Rehab PT Goals Patient Stated Goal: Return home PT Goal Formulation: With patient/family Time For Goal Achievement: 05/28/24 Progress towards PT goals: Progressing toward goals    Frequency    Min 3X/week      PT Plan      Co-evaluation              AM-PAC PT 6 Clicks Mobility   Outcome Measure  Help needed turning from your back to  your side while in a flat bed without using bedrails?: A Little Help needed moving from lying on your back to sitting on the side of a flat bed without using bedrails?: A Little Help needed moving to and from a bed to a chair (including a wheelchair)?: A Little Help needed standing up from a chair using your arms (e.g., wheelchair or bedside chair)?: A Little Help needed to walk in hospital room?: Total Help needed climbing 3-5 steps with a railing? : Total 6 Click Score: 14    End of Session Equipment Utilized During Treatment: Gait belt Activity Tolerance: Patient tolerated treatment well;Treatment limited secondary to medical complications (Comment);Other (comment) (symptomatic orthostatic hypotension limiting standing tolerance; good effort) Patient left: in chair;with call bell/phone within reach;with family/visitor present;Other (comment) (spouse in room) Nurse Communication: Mobility status;Precautions;Other (comment) (spouse instructed to help him recline his limbs if he gets more dizzy) PT Visit Diagnosis: Other abnormalities of gait and mobility (R26.89);Muscle weakness (generalized) (M62.81)     Time: 8841-8763 PT Time Calculation (min) (ACUTE ONLY): 38 min  Charges:    $Gait Training: 8-22 mins $Therapeutic Exercise: 8-22 mins $Therapeutic Activity: 8-22 mins PT General Charges $$ ACUTE PT VISIT: 1 Visit                     Gerasimos Plotts P., PTA Acute Rehabilitation Services Secure Chat Preferred 9a-5:30pm Office: [phone]    Connell HERO [facility] 05/19/2024, 12:59 PM

## 2024-05-20 ENCOUNTER — Ambulatory Visit: Attending: Cardiology

## 2024-05-20 ENCOUNTER — Ambulatory Visit

## 2024-05-20 ENCOUNTER — Telehealth: Payer: Self-pay | Admitting: Radiation Oncology

## 2024-05-20 DIAGNOSIS — I5042 Chronic combined systolic (congestive) and diastolic (congestive) heart failure: Secondary | ICD-10-CM

## 2024-05-20 DIAGNOSIS — Z95810 Presence of automatic (implantable) cardiac defibrillator: Secondary | ICD-10-CM

## 2024-05-20 NOTE — Telephone Encounter (Signed)
 LVM to schedule f/u with PA Wyatt as requested by Dr. Izell. F/u scheduled for 9/9@9 :30am.

## 2024-05-21 ENCOUNTER — Telehealth: Payer: Self-pay | Admitting: Oncology

## 2024-05-21 ENCOUNTER — Encounter: Payer: Self-pay | Admitting: Cardiovascular Disease

## 2024-05-21 ENCOUNTER — Ambulatory Visit

## 2024-05-21 LAB — ECHO TEE: Est EF: <20

## 2024-05-21 NOTE — Telephone Encounter (Signed)
 [name] called in to cancel all of Christian Bowman's appointments.

## 2024-05-21 NOTE — Telephone Encounter (Signed)
 I assume he still has  the ondansetron  prescription. Also see compazine  (prochlorperazine ) on his list at DC.  He can use diphenhydramine  (Benadryl ) 25-50 mg every 4 hours, but not to exceed 300 mg in 24 hours.  Watch out for oversedation. Can also worsen dry mouth and constipation, especially when prochlorperazine  and benadryl  are used together.

## 2024-05-21 NOTE — Radiation Completion Notes (Signed)
 Patient Name: Christian Bowman, Christian Bowman MRN: [id number] Date of Birth: [birth year] Referring Physician: ELENA LARRY, M.D. Date of Service: 2024-05-21 Radiation Oncologist: Lauraine Golden, M.D. [facility]                             RADIATION ONCOLOGY END OF TREATMENT NOTE     Diagnosis: C09.0 Malignant neoplasm of tonsillar fossa Staging on 2024-03-04: Primary cancer of tonsillar fossa (HCC) T=cT3, N=cN0, M=cM0 Intent: Curative     ==========DELIVERED PLANS==========  First Treatment Date: 2024-03-29 Last Treatment Date: 2024-05-02   Plan Name: [id number]_Tonsil Site: Tonsil, Right Technique: IMRT Mode: Photon Dose Per Fraction: 2 Gy Prescribed Dose (Delivered / Prescribed): 32 Gy / 70 Gy Prescribed Fxs (Delivered / Prescribed): 16 / 35     ==========ON TREATMENT VISIT DATES========== 2024-04-15, 2024-04-21, 2024-04-28     ==========UPCOMING VISITS========== 08/13/2024 [facility] AT [facility] ST OFFICE VISIT Croitoru, Mihai, MD  07/22/2024 [facility]-HEART[facility] AT [facility] HOME REMOTE DEFIB CK CVD HVT DEVICE REMOTES  06/18/2024 [facility] REH AT [employer], Blaire L, PT  06/10/2024 [facility]-RADIATION ONC FOLLOW UP [address] Leeroy HERO, [place]  06/03/2024 [facility] AT [facility] HOME REMOTE DEFIB CK CVD HVT DEVICE REMOTES  05/27/2024 [facility]ART[facility] AT [facility] HOME REMOTE DEFIB CK CVD HVT DEVICE REMOTES        ==========APPENDIX - ON TREATMENT VISIT NOTES==========   See weekly On Treatment Notes in Epic for details in the Media tab (listed as Progress notes on the On Treatment Visit Dates listed above).

## 2024-05-22 ENCOUNTER — Ambulatory Visit

## 2024-05-22 ENCOUNTER — Inpatient Hospital Stay

## 2024-05-22 ENCOUNTER — Inpatient Hospital Stay: Admitting: Oncology

## 2024-05-23 ENCOUNTER — Ambulatory Visit

## 2024-05-23 ENCOUNTER — Ambulatory Visit: Admitting: Cardiovascular Disease

## 2024-05-23 NOTE — Progress Notes (Signed)
 EPIC Encounter for ICM Monitoring  Patient Name: Christian Bowman is a 78 y.o. male Date: 05/23/2024 Primary Care Physican: Pcp, No Primary Cardiologist: Croitoru/McLean Electrophysiologist: Camnitz Bi-V Pacing: 94.8%           09/13/2023 Office Weight: 192 lbs 01/01/2024 Weight: 189 lbs 03/14/2024 Weight: 189 lbs 05/01/2024 Office Weight: 189 lbs   Since 15-May-2024 Time in AT/AF              0.2 hours/day (0.9 %)   Spoke with wife and transmission results reviewed.   She reports  patient is going into hospice to help get the feedings he needs. He is actually doing a little better.  She is concerned insurance will not cover monthly ICM checks.  Advised she can call as needed for fluid checks and will continue with every 3 months monitoring until ready to make a change.    Optivol thoracic impedance suggesting fluid levels returned to normal.    Prescribed:  Furosemide  20 mg Take 1 tablet (20 mg total) by mouth daily as needed (for a weight over 190 lbs).   Labs: 05/01/2024 Creatinine 1.30, BUN 39, Potassium 4.8, Sodium 136, GFR 57 04/24/2024 Creatinine 1.39, BUN 43, Potassium 4.6, Sodium 138, GFR 53  04/17/2024 Creatinine 1.50, BUN 61, Potassium 5.1, Sodium 138, GFR 48  04/09/2024 Creatinine 1.20, BUN 33, Potassium 4.2, Sodium 138  03/21/2024 Creatinine 1.63, BUN 30, Potassium 4.4, Sodium 139, GFR 43 02/19/2024 Creatinine 1.75, BUN 30, Potassium 4.4, Sodium 139  A complete set of results can be found in Results Review.   Recommendations:  Monthly ICM checks discontinued at this time due to patient is going into hospice.    Follow-up plan: No ICM clinic phone appointments scheduled.   91 day device clinic remote transmission 07/22/2024.     EP/Cardiology Office Visits:  Recall 08/18/2024 with Dr. Rolan.   05/23/2024 with Dr Francyne.  Recall 06/18/2024 with EP APP.   Copy of ICM check sent to Dr. Inocencio.  3 month ICM trend: 05/20/2024.    12-14 Month ICM trend:     [name], RN 05/23/2024 3:27 PM

## 2024-05-26 ENCOUNTER — Ambulatory Visit

## 2024-05-26 ENCOUNTER — Ambulatory Visit (INDEPENDENT_AMBULATORY_CARE_PROVIDER_SITE_OTHER)

## 2024-05-26 DIAGNOSIS — I5042 Chronic combined systolic (congestive) and diastolic (congestive) heart failure: Secondary | ICD-10-CM

## 2024-05-27 ENCOUNTER — Ambulatory Visit

## 2024-05-28 ENCOUNTER — Ambulatory Visit

## 2024-05-29 ENCOUNTER — Ambulatory Visit

## 2024-05-29 ENCOUNTER — Ambulatory Visit: Payer: Self-pay | Admitting: Cardiology

## 2024-05-30 ENCOUNTER — Ambulatory Visit

## 2024-05-30 LAB — CUP PACEART REMOTE DEVICE CHECK
Battery Remaining Longevity: 96 mo
Battery Remaining Longevity: 96 mo
Battery Voltage: 2.99 V
Battery Voltage: 2.99 V
Brady Statistic RV Percent Paced: 94.17 %
Brady Statistic RV Percent Paced: 94.17 %
Date Time Interrogation Session: 20250825182241
Date Time Interrogation Session: 20250825182241
HighPow Impedance: 71 Ohm
HighPow Impedance: 71 Ohm
Implantable Lead Connection Status: 753985
Implantable Lead Connection Status: 753985
Implantable Lead Connection Status: 753985
Implantable Lead Connection Status: 753985
Implantable Lead Connection Status: 753985
Implantable Lead Connection Status: 753985
Implantable Lead Implant Date: 20171024
Implantable Lead Implant Date: 20171024
Implantable Lead Implant Date: 20171024
Implantable Lead Implant Date: 20171024
Implantable Lead Implant Date: 20171024
Implantable Lead Implant Date: 20171024
Implantable Lead Location: 753858
Implantable Lead Location: 753859
Implantable Lead Location: 753860
Implantable Lead Location: 753858
Implantable Lead Location: 753859
Implantable Lead Location: 753860
Implantable Lead Model: 4598
Implantable Lead Model: 5076
Implantable Lead Model: 4598
Implantable Lead Model: 5076
Implantable Pulse Generator Implant Date: 20241127
Implantable Pulse Generator Implant Date: 20241127
Lead Channel Impedance Value: 1007 Ohm
Lead Channel Impedance Value: 1045 Ohm
Lead Channel Impedance Value: 323 Ohm
Lead Channel Impedance Value: 380 Ohm
Lead Channel Impedance Value: 418 Ohm
Lead Channel Impedance Value: 437 Ohm
Lead Channel Impedance Value: 532 Ohm
Lead Channel Impedance Value: 532 Ohm
Lead Channel Impedance Value: 589 Ohm
Lead Channel Impedance Value: 684 Ohm
Lead Channel Impedance Value: 912 Ohm
Lead Channel Impedance Value: 931 Ohm
Lead Channel Impedance Value: 950 Ohm
Lead Channel Impedance Value: 1007 Ohm
Lead Channel Impedance Value: 1045 Ohm
Lead Channel Impedance Value: 323 Ohm
Lead Channel Impedance Value: 380 Ohm
Lead Channel Impedance Value: 418 Ohm
Lead Channel Impedance Value: 437 Ohm
Lead Channel Impedance Value: 532 Ohm
Lead Channel Impedance Value: 532 Ohm
Lead Channel Impedance Value: 589 Ohm
Lead Channel Impedance Value: 684 Ohm
Lead Channel Impedance Value: 912 Ohm
Lead Channel Impedance Value: 931 Ohm
Lead Channel Impedance Value: 950 Ohm
Lead Channel Pacing Threshold Amplitude: 0.625 V
Lead Channel Pacing Threshold Amplitude: 0.875 V
Lead Channel Pacing Threshold Amplitude: 0.625 V
Lead Channel Pacing Threshold Amplitude: 0.875 V
Lead Channel Pacing Threshold Pulse Width: 0.4 ms
Lead Channel Pacing Threshold Pulse Width: 1 ms
Lead Channel Pacing Threshold Pulse Width: 0.4 ms
Lead Channel Pacing Threshold Pulse Width: 1 ms
Lead Channel Sensing Intrinsic Amplitude: 1.5 mV
Lead Channel Sensing Intrinsic Amplitude: 19.9 mV
Lead Channel Sensing Intrinsic Amplitude: 1.5 mV
Lead Channel Sensing Intrinsic Amplitude: 19.9 mV
Lead Channel Setting Pacing Amplitude: 2 V
Lead Channel Setting Pacing Amplitude: 2 V
Lead Channel Setting Pacing Amplitude: 2 V
Lead Channel Setting Pacing Amplitude: 2 V
Lead Channel Setting Pacing Amplitude: 2 V
Lead Channel Setting Pacing Amplitude: 2 V
Lead Channel Setting Pacing Pulse Width: 0.4 ms
Lead Channel Setting Pacing Pulse Width: 1 ms
Lead Channel Setting Pacing Pulse Width: 0.4 ms
Lead Channel Setting Pacing Pulse Width: 1 ms
Lead Channel Setting Sensing Sensitivity: 0.3 mV
Lead Channel Setting Sensing Sensitivity: 0.3 mV
Zone Setting Status: 755011
Zone Setting Status: 755011
Zone Setting Status: 755011
Zone Setting Status: 755011
Zone Setting Status: 755011
Zone Setting Status: 755011

## 2024-06-02 ENCOUNTER — Ambulatory Visit

## 2024-06-03 ENCOUNTER — Encounter

## 2024-06-03 ENCOUNTER — Ambulatory Visit

## 2024-06-03 NOTE — Progress Notes (Signed)
Remote ICD Transmission.

## 2024-06-04 ENCOUNTER — Ambulatory Visit

## 2024-06-05 ENCOUNTER — Ambulatory Visit

## 2024-06-05 ENCOUNTER — Encounter: Payer: Self-pay | Admitting: Pulmonary Disease

## 2024-06-05 ENCOUNTER — Telehealth: Payer: Self-pay | Admitting: Radiation Oncology

## 2024-06-05 NOTE — Telephone Encounter (Signed)
 Pt's wife [name] called to cx upcoming f/u appt with PA Wyatt. She advised pt is currently in hospice care and doctors are advising pt's heart may not do well with any continued treatments at this time. She stated this was one of her concerns from the beginning of their journey through this dx. She is requesting c/b from clinical team member to answer questions related to his feeding tube, etc. She advised they do have hope for improvement for pt since he is able to consume PO 25%. [name] advised best c/b number [phone]. Dr. Izell and team advised via telephone note.

## 2024-06-06 ENCOUNTER — Ambulatory Visit

## 2024-06-06 NOTE — Progress Notes (Signed)
 Oncology Nurse Navigator Documentation   I received a message yesterday from the radiation scheduling team that Mrs. Quaranta had questions regarding Mr. Pfalzgraf feeding tube. I called and left a message with Mrs. Wrobel with my direct number to call me back with her questions at anytime.  Delon Jefferson RN, BSN, OCN Head & Neck Oncology Nurse Navigator Head of the [facility] Phone # [phone]  Fax # [phone]

## 2024-06-09 ENCOUNTER — Ambulatory Visit

## 2024-06-10 ENCOUNTER — Ambulatory Visit

## 2024-06-10 ENCOUNTER — Ambulatory Visit: Admitting: Radiology

## 2024-06-11 ENCOUNTER — Ambulatory Visit

## 2024-06-12 ENCOUNTER — Ambulatory Visit

## 2024-06-13 ENCOUNTER — Ambulatory Visit

## 2024-06-16 ENCOUNTER — Ambulatory Visit

## 2024-06-17 ENCOUNTER — Encounter

## 2024-06-17 ENCOUNTER — Telehealth: Payer: Self-pay

## 2024-06-17 NOTE — Telephone Encounter (Signed)
 Spoke to pts wife about Dr. JAYSON recommendations. Wife became tearful and wanted to know if there are any medications he could take to get him back in rhythm. Advised wife I would forward to Dr. JAYSON to advise further.   Comfort was provided to wife and reassurance provided.

## 2024-06-17 NOTE — Telephone Encounter (Signed)
 Rate is well-controlled and he is still getting virtually 100% biventricular pacing and no sign of fluid overload by OptiVol.  Considering his other health problems I do not think cardioversion is the right way to go here, at least not unless his overall clinical situation changes significantly.

## 2024-06-17 NOTE — Telephone Encounter (Signed)
 I spoke with Christian Bowman.  At this point I see no benefit in adding medications.  If he loses effective CRT, we can consider amiodarone .  Right now, he still has very poor appetite and no taste and I am afraid amiodarone  would simply worsen those complaints and lead to worsening nutrition

## 2024-06-17 NOTE — Telephone Encounter (Signed)
 Ongoing AF since 06/10/24 (7 days) w/ controlled rates. On OAC. Note decrease in activity level with increase in AF burden.  Patients wife [name] advises patient is in hospice care has not felt well. States patient has voiced not wanting to go to anymore doctors. Wife states she would talk to patient after she hears back from Dr. Francyne on his thoughts.   Wife states she has not been able to give Farxiga  since patient is unable to swallow pills, all his medications are given via feeding tube and she cannot crush Farxiga .   Advised I will forward to Dr. Francyne for recommendations. Wife would like call back with next steps from Dr. JAYSON.

## 2024-06-18 ENCOUNTER — Ambulatory Visit: Payer: Self-pay | Admitting: Physical Therapy

## 2024-06-18 ENCOUNTER — Other Ambulatory Visit (HOSPITAL_COMMUNITY): Payer: Self-pay

## 2024-07-04 NOTE — Progress Notes (Signed)
 Remote ICD Transmission

## 2024-07-22 ENCOUNTER — Ambulatory Visit: Payer: Medicare Other

## 2024-07-23 ENCOUNTER — Other Ambulatory Visit: Payer: Self-pay

## 2024-08-13 ENCOUNTER — Ambulatory Visit: Admitting: Cardiovascular Disease

## 2024-08-15 ENCOUNTER — Telehealth: Payer: Self-pay

## 2024-08-15 MED ORDER — TORSEMIDE 20 MG PO TABS: 40.0000 mg | ORAL_TABLET | Freq: Every day | ORAL | 1 refills | Status: AC

## 2024-08-15 NOTE — Telephone Encounter (Signed)
 Returned wife's call per DPR as requested by voice mail message. She reports patient is SOB and thinks it may be related to fluid accumulation. She would like to send remote transmission to check fluid levels on device report.  Monthly ICM was discontinued due to patient entering hospice.  She is aware that insurance will deny payment for monthly fluid levels checks but is fine with paying out of pocket.  Discussed options regarding fluid monitoring and explained she can talk with hospice when patient becomes symptomatic such as SOB and/or lower extremity swelling instead of paying out of pocket for monthly device checks.  Also ICM device check can be done only when he becomes symptomatic for fluid accumulation so she does not have a monthly bill.  Today she would like to send a remote transmission for review.  Advised that would be fine and will call her back with results.

## 2024-08-15 NOTE — Telephone Encounter (Signed)
 Spoke with wife, Christian Bowman per DPR and provided remote transmission results.  Pt currently has hospice at home.     Pts is SOB only, no lower extremity swelling.  Confirmed patient take Torsemide  20 mg daily.    Dr Francyne addressed the start of AF on 06/10/2024 per 06/17/2024 phone note sent by device clinic nurse.  Sent to Dr Francyne for review and recommendations for:  Possible fluid accumulation since 9/14  Recommendation if monthly ICM fluid level checks should resume (stopped when patient entered hospice).  Wife states insurance will not pay and she will pay out of pocket.     08/15/2024  Opitvol thoracic impedance suggesting possible fluid accumulation starting after 06/15/2024.

## 2024-08-15 NOTE — Telephone Encounter (Signed)
 Spoke with wife.  Advised Dr Francyne recommended to increase Torsemide  to 40 mg daily.  He suggested remote downloads be as needed.  She was agreeable with plan.    She will start Torsemide  40 mg tomorrow.  Will recheck fluid levels 08/19/2024.  Advised to return to 20 mg if she thinks patient is getting dehydrated from taking the 40 mg daily.

## 2024-08-15 NOTE — Telephone Encounter (Signed)
 Thanks, Mitzie.  I recommend increasing the torsemide  to 40 mg daily and we can do the downloads on an as needed basis to avoid the cost and the insurance issues.

## 2024-08-19 ENCOUNTER — Ambulatory Visit: Attending: Cardiology

## 2024-08-19 ENCOUNTER — Telehealth: Payer: Self-pay

## 2024-08-19 DIAGNOSIS — Z95810 Presence of automatic (implantable) cardiac defibrillator: Secondary | ICD-10-CM

## 2024-08-19 DIAGNOSIS — I5042 Chronic combined systolic (congestive) and diastolic (congestive) heart failure: Secondary | ICD-10-CM

## 2024-08-19 NOTE — Progress Notes (Signed)
 EPIC Encounter for ICM Monitoring  Patient Name: Christian Bowman is a 78 y.o. male Date: 08/19/2024 Primary Care Physican: Pcp, No Primary Cardiologist: Croitoru/McLean Electrophysiologist: Camnitz Bi-V Pacing: 87%           09/13/2023 Office Weight: 192 lbs 01/01/2024 Weight: 189 lbs 03/14/2024 Weight: 189 lbs 05/01/2024 Office Weight: 189 lbs   Since 15-Aug-2024 Time in AT/AF  24.0 hours/day (100.0 %)   Attempted call to wife per DPR and unable to reach.  Left message for return call.  Transmission results reviewed. ICM resumed on as needed basis at request of wife.  Pt remains in hospice at this time and has feeding tube.    Optivol thoracic impedance suggesting possible fluid accumulation since 06/05/2024 but slight improvement after receiving Torsemide  40 mg daily since 08/16/2024.  Ongoing AF since 06/10/2024 and Dr Francyne is aware (per 06/17/2024 phone note)   Prescribed:  Torsemide  20 mg Take 2 tablets (40 mg total) into feeding tube daily (increased 08/15/2024).   Labs: 05/19/2024 Creatinine 1.97, BUN 77, Potassium 3.8, Sodium 142, GFR 35  05/18/2024 Creatinine 2.05, BUN 76, Potassium 3.9, Sodium 140, GFR 33  05/17/2024 Creatinine 2.14, BUN 74, Potassium 3.9, Sodium 140, GFR 31  05/16/2024 Creatinine 2.15, BUN 69, Potassium 3.8, Sodium 139, GFR 31 05/15/2024 Creatinine 2.31, BUN 69, Potassium 4.0, Sodium 140, GFR 29  A complete set of results can be found in Results Review.   Recommendations:  Unable to reach.     Follow-up plan: ICM clinic phone appointments scheduled as needed basis as requested by wife.    91 day device clinic remote transmission 08/26/2024.     EP/Cardiology Office Visits:  Recall 08/18/2024 with Dr. Rolan.  Recall 06/18/2024 with EP APP.   Copy of ICM check sent to Dr. Inocencio.    Remote monitoring is medically necessary for Heart Failure Management.    Daily Thoracic Impedance ICM trend: 05/19/2024 through 08/18/2024.    12-14 Month Thoracic Impedance  ICM trend:     [name], RN 08/19/2024 8:10 AM

## 2024-08-19 NOTE — Progress Notes (Signed)
 Spoke with wife, [name] per [place].  She reports patient has been very constipated with nausea over the last 2 days but did get relief this morning.  He was up all night and currently sleeping.   She stated he had good urine out put when he took Torsemide  20 mg twice a day but the 40 mg at one time was not as effective.  He's had daily Torsemide  for the past 3 days.   She will continue the Torsemide  and determine if 20 mg twice a day or 40 mg daily has best urine output.  Will recheck fluid levels on 08/26/2024.

## 2024-08-19 NOTE — Telephone Encounter (Signed)
Remote ICM transmission received.  Attempted call to wife per DPR regarding ICM remote transmission and left message to return call.  ?

## 2024-08-26 ENCOUNTER — Ambulatory Visit: Attending: Cardiology

## 2024-08-26 ENCOUNTER — Ambulatory Visit (INDEPENDENT_AMBULATORY_CARE_PROVIDER_SITE_OTHER)

## 2024-08-26 DIAGNOSIS — I5042 Chronic combined systolic (congestive) and diastolic (congestive) heart failure: Secondary | ICD-10-CM | POA: Diagnosis not present

## 2024-08-26 DIAGNOSIS — Z95810 Presence of automatic (implantable) cardiac defibrillator: Secondary | ICD-10-CM

## 2024-08-26 NOTE — Progress Notes (Signed)
 EPIC Encounter for ICM Monitoring  Patient Name: Christian Bowman is a 78 y.o. male Date: 08/26/2024 Primary Care Physican: Pcp, Bowman Primary Cardiologist: Croitoru/McLean Electrophysiologist: Camnitz Bi-V Pacing: 91.4%           09/13/2023 Office Weight: 192 lbs 01/01/2024 Weight: 189 lbs 03/14/2024 Weight: 189 lbs 05/01/2024 Office Weight: 189 lbs   Since 15-Aug-2024 Time in AT/AF  24.0 hours/day (100.0 %)   Spoke with wife and heart failure questions reviewed.  Transmission results reviewed.  Pt   Pt remains in hospice at this time and has feeding tube.   Pt's feeding tube fell out over the weekend and reinserted.  Feet were a little swollen this morning. SOB has improved.     Optivol thoracic impedance suggesting possible fluid accumulation since 06/05/2024 but slight improvement since taking Torsemide  40 mg daily   Prescribed:  Torsemide  20 mg Take 2 tablets (40 mg total) into feeding tube daily (increased 08/15/2024).   Labs: 05/19/2024 Creatinine 1.97, BUN 77, Potassium 3.8, Sodium 142, GFR 35  05/18/2024 Creatinine 2.05, BUN 76, Potassium 3.9, Sodium 140, GFR 33  05/17/2024 Creatinine 2.14, BUN 74, Potassium 3.9, Sodium 140, GFR 31  05/16/2024 Creatinine 2.15, BUN 69, Potassium 3.8, Sodium 139, GFR 31 05/15/2024 Creatinine 2.31, BUN 69, Potassium 4.0, Sodium 140, GFR 29  A complete set of results can be found in Results Review.   Recommendations:  Advised to call if she feels the need to have his fluid levels checked.    Follow-up plan: ICM clinic phone appointments scheduled as needed basis as requested by wife.    91 day device clinic remote transmission 11/25/2024.     EP/Cardiology Office Visits:  Recall 08/18/2024 with Dr. Rolan.  Recall 06/18/2024 with EP APP.   Copy of ICM check sent to Dr. Inocencio.    Remote monitoring is medically necessary for Heart Failure Management.    Daily Thoracic Impedance ICM trend: 05/25/2024 through 08/25/2024.    12-14 Month Thoracic  Impedance ICM trend:     [name], RN 08/26/2024 7:27 AM

## 2024-08-27 LAB — CUP PACEART REMOTE DEVICE CHECK
Battery Remaining Longevity: 82 mo
Battery Voltage: 2.98 V
Brady Statistic RV Percent Paced: 91.4 %
Date Time Interrogation Session: 20251124184019
HighPow Impedance: 51 Ohm
Implantable Lead Connection Status: 753985
Implantable Lead Connection Status: 753985
Implantable Lead Connection Status: 753985
Implantable Lead Implant Date: 20171024
Implantable Lead Implant Date: 20171024
Implantable Lead Implant Date: 20171024
Implantable Lead Location: 753858
Implantable Lead Location: 753859
Implantable Lead Location: 753860
Implantable Lead Model: 4598
Implantable Lead Model: 5076
Implantable Pulse Generator Implant Date: 20241127
Lead Channel Impedance Value: 266 Ohm
Lead Channel Impedance Value: 342 Ohm
Lead Channel Impedance Value: 342 Ohm
Lead Channel Impedance Value: 342 Ohm
Lead Channel Impedance Value: 399 Ohm
Lead Channel Impedance Value: 418 Ohm
Lead Channel Impedance Value: 418 Ohm
Lead Channel Impedance Value: 570 Ohm
Lead Channel Impedance Value: 684 Ohm
Lead Channel Impedance Value: 722 Ohm
Lead Channel Impedance Value: 722 Ohm
Lead Channel Impedance Value: 779 Ohm
Lead Channel Impedance Value: 798 Ohm
Lead Channel Pacing Threshold Amplitude: 0.625 V
Lead Channel Pacing Threshold Amplitude: 1.125 V
Lead Channel Pacing Threshold Pulse Width: 0.4 ms
Lead Channel Pacing Threshold Pulse Width: 1 ms
Lead Channel Sensing Intrinsic Amplitude: 16 mV
Lead Channel Sensing Intrinsic Amplitude: 2.4 mV
Lead Channel Setting Pacing Amplitude: 2 V
Lead Channel Setting Pacing Amplitude: 2 V
Lead Channel Setting Pacing Amplitude: 2.25 V
Lead Channel Setting Pacing Pulse Width: 0.4 ms
Lead Channel Setting Pacing Pulse Width: 1 ms
Lead Channel Setting Sensing Sensitivity: 0.3 mV
Zone Setting Status: 755011
Zone Setting Status: 755011
Zone Setting Status: 755011

## 2024-08-29 NOTE — Progress Notes (Signed)
 Remote ICD Transmission

## 2024-09-01 ENCOUNTER — Encounter: Payer: Self-pay | Admitting: Oncology

## 2024-09-05 NOTE — Addendum Note (Signed)
 Encounter addended by: Crawford Dorothyann POUR on: 09/05/2024 2:55 PM  Actions taken: Imaging Exam ended

## 2024-09-08 ENCOUNTER — Encounter: Payer: Self-pay | Admitting: Nurse Practitioner

## 2024-09-08 ENCOUNTER — Encounter: Payer: Self-pay | Admitting: Oncology

## 2024-09-10 ENCOUNTER — Ambulatory Visit: Payer: Self-pay | Admitting: Cardiology

## 2024-09-10 ENCOUNTER — Ambulatory Visit: Admitting: "Endocrinology

## 2024-11-25 ENCOUNTER — Ambulatory Visit

## 2025-02-24 ENCOUNTER — Ambulatory Visit

## 2025-05-26 ENCOUNTER — Ambulatory Visit

## 2025-08-25 ENCOUNTER — Ambulatory Visit

## 2025-11-24 ENCOUNTER — Ambulatory Visit
